# Patient Record
Sex: Male | Born: 1935 | Race: Black or African American | Hispanic: No | Smoking: Never smoker
Health system: Southern US, Community
[De-identification: ages and names within clinical notes are randomized; demographics above are authoritative.]

## PROBLEM LIST (undated history)

## (undated) DIAGNOSIS — R32 Unspecified urinary incontinence: Secondary | ICD-10-CM

## (undated) DIAGNOSIS — N400 Enlarged prostate without lower urinary tract symptoms: Secondary | ICD-10-CM

## (undated) DIAGNOSIS — D329 Benign neoplasm of meninges, unspecified: Secondary | ICD-10-CM

## (undated) DIAGNOSIS — H4310 Vitreous hemorrhage, unspecified eye: Secondary | ICD-10-CM

## (undated) DIAGNOSIS — I639 Cerebral infarction, unspecified: Secondary | ICD-10-CM

## (undated) DIAGNOSIS — D649 Anemia, unspecified: Secondary | ICD-10-CM

## (undated) DIAGNOSIS — N189 Chronic kidney disease, unspecified: Secondary | ICD-10-CM

## (undated) DIAGNOSIS — H9191 Unspecified hearing loss, right ear: Secondary | ICD-10-CM

## (undated) DIAGNOSIS — E278 Other specified disorders of adrenal gland: Secondary | ICD-10-CM

## (undated) DIAGNOSIS — I10 Essential (primary) hypertension: Secondary | ICD-10-CM

## (undated) DIAGNOSIS — Z86718 Personal history of other venous thrombosis and embolism: Secondary | ICD-10-CM

## (undated) DIAGNOSIS — E785 Hyperlipidemia, unspecified: Secondary | ICD-10-CM

## (undated) DIAGNOSIS — I6340 Cerebral infarction due to embolism of unspecified cerebral artery: Secondary | ICD-10-CM

## (undated) DIAGNOSIS — R296 Repeated falls: Secondary | ICD-10-CM

## (undated) DIAGNOSIS — D696 Thrombocytopenia, unspecified: Secondary | ICD-10-CM

## (undated) DIAGNOSIS — I82409 Acute embolism and thrombosis of unspecified deep veins of unspecified lower extremity: Secondary | ICD-10-CM

## (undated) HISTORY — DX: Benign neoplasm of meninges, unspecified: D32.9

## (undated) HISTORY — DX: Hyperlipidemia, unspecified: E78.5

## (undated) HISTORY — DX: Cerebral infarction, unspecified: I63.9

## (undated) HISTORY — DX: Repeated falls: R29.6

## (undated) HISTORY — DX: Other specified disorders of adrenal gland: E27.8

## (undated) HISTORY — DX: Acute embolism and thrombosis of unspecified deep veins of unspecified lower extremity: I82.409

## (undated) HISTORY — PX: MYRINGOTOMY WITH TUBE PLACEMENT: SHX5663

## (undated) HISTORY — DX: Vitreous hemorrhage, unspecified eye: H43.10

## (undated) HISTORY — DX: Cerebral infarction due to embolism of unspecified cerebral artery: I63.40

---
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
## 2000-10-14 ENCOUNTER — Emergency Department (HOSPITAL_COMMUNITY): Admission: EM | Admit: 2000-10-14 | Discharge: 2000-10-14 | Payer: Self-pay

## 2011-07-19 ENCOUNTER — Emergency Department (HOSPITAL_COMMUNITY): Payer: PRIVATE HEALTH INSURANCE

## 2011-07-19 ENCOUNTER — Encounter (HOSPITAL_COMMUNITY): Payer: Self-pay | Admitting: Internal Medicine

## 2011-07-19 ENCOUNTER — Inpatient Hospital Stay (HOSPITAL_COMMUNITY)
Admission: EM | Admit: 2011-07-19 | Discharge: 2011-07-24 | DRG: 066 | Disposition: A | Discharge status: Rehabilitation Facility | Payer: PRIVATE HEALTH INSURANCE | Attending: Internal Medicine | Admitting: Internal Medicine

## 2011-07-19 DIAGNOSIS — N400 Enlarged prostate without lower urinary tract symptoms: Secondary | ICD-10-CM | POA: Diagnosis present

## 2011-07-19 DIAGNOSIS — I10 Essential (primary) hypertension: Secondary | ICD-10-CM

## 2011-07-19 DIAGNOSIS — R4789 Other speech disturbances: Secondary | ICD-10-CM

## 2011-07-19 DIAGNOSIS — I6330 Cerebral infarction due to thrombosis of unspecified cerebral artery: Secondary | ICD-10-CM

## 2011-07-19 DIAGNOSIS — H9190 Unspecified hearing loss, unspecified ear: Secondary | ICD-10-CM | POA: Diagnosis present

## 2011-07-19 DIAGNOSIS — R279 Unspecified lack of coordination: Secondary | ICD-10-CM | POA: Diagnosis present

## 2011-07-19 DIAGNOSIS — D320 Benign neoplasm of cerebral meninges: Secondary | ICD-10-CM | POA: Diagnosis present

## 2011-07-19 DIAGNOSIS — I6350 Cerebral infarction due to unspecified occlusion or stenosis of unspecified cerebral artery: Principal | ICD-10-CM | POA: Diagnosis present

## 2011-07-19 DIAGNOSIS — D329 Benign neoplasm of meninges, unspecified: Secondary | ICD-10-CM | POA: Diagnosis present

## 2011-07-19 DIAGNOSIS — Z8673 Personal history of transient ischemic attack (TIA), and cerebral infarction without residual deficits: Secondary | ICD-10-CM | POA: Diagnosis present

## 2011-07-19 DIAGNOSIS — R269 Unspecified abnormalities of gait and mobility: Secondary | ICD-10-CM | POA: Diagnosis present

## 2011-07-19 DIAGNOSIS — I639 Cerebral infarction, unspecified: Secondary | ICD-10-CM

## 2011-07-19 HISTORY — DX: Unspecified hearing loss, right ear: H91.91

## 2011-07-19 HISTORY — DX: Essential (primary) hypertension: I10

## 2011-07-19 LAB — DIFFERENTIAL
Basophils Absolute: 0 10*3/uL (ref 0.0–0.1)
Basophils Relative: 0 % (ref 0–1)
Eosinophils Absolute: 0 10*3/uL (ref 0.0–0.7)
Eosinophils Relative: 0 % (ref 0–5)
Lymphocytes Relative: 9 % — ABNORMAL LOW (ref 12–46)
Lymphs Abs: 0.8 10*3/uL (ref 0.7–4.0)
Monocytes Absolute: 0.2 10*3/uL (ref 0.1–1.0)
Monocytes Relative: 3 % (ref 3–12)
Neutro Abs: 7.5 10*3/uL (ref 1.7–7.7)
Neutrophils Relative %: 89 % — ABNORMAL HIGH (ref 43–77)

## 2011-07-19 LAB — CBC
HCT: 39.2 % (ref 39.0–52.0)
Hemoglobin: 13.6 g/dL (ref 13.0–17.0)
MCH: 31.7 pg (ref 26.0–34.0)
MCHC: 34.7 g/dL (ref 30.0–36.0)
MCV: 91.4 fL (ref 78.0–100.0)
Platelets: 144 10*3/uL — ABNORMAL LOW (ref 150–400)
RBC: 4.29 MIL/uL (ref 4.22–5.81)
RDW: 12.7 % (ref 11.5–15.5)
WBC: 8.5 10*3/uL (ref 4.0–10.5)

## 2011-07-19 LAB — POCT I-STAT TROPONIN I: Troponin i, poc: 0.01 ng/mL (ref 0.00–0.08)

## 2011-07-19 LAB — COMPREHENSIVE METABOLIC PANEL
ALT: 17 U/L (ref 0–53)
AST: 29 U/L (ref 0–37)
Albumin: 3.8 g/dL (ref 3.5–5.2)
Alkaline Phosphatase: 57 U/L (ref 39–117)
BUN: 11 mg/dL (ref 6–23)
CO2: 25 mEq/L (ref 19–32)
Calcium: 10.5 mg/dL (ref 8.4–10.5)
Chloride: 102 mEq/L (ref 96–112)
Creatinine, Ser: 0.62 mg/dL (ref 0.50–1.35)
GFR calc Af Amer: >90 mL/min (ref >90)
GFR calc non Af Amer: 89 mL/min — ABNORMAL LOW (ref >90)
Glucose, Bld: 152 mg/dL — ABNORMAL HIGH (ref 70–99)
Potassium: 4.6 mEq/L (ref 3.5–5.1)
Sodium: 139 mEq/L (ref 135–145)
Total Bilirubin: 0.3 mg/dL (ref 0.3–1.2)
Total Protein: 7.4 g/dL (ref 6.0–8.3)

## 2011-07-19 LAB — URINALYSIS, ROUTINE W REFLEX MICROSCOPIC
Bilirubin Urine: NEGATIVE
Glucose, UA: NEGATIVE mg/dL
Ketones, ur: 15 mg/dL — AB
Leukocytes, UA: NEGATIVE
Nitrite: NEGATIVE
Protein, ur: NEGATIVE mg/dL
Specific Gravity, Urine: 1.012 (ref 1.005–1.030)
Urobilinogen, UA: 0.2 mg/dL (ref 0.0–1.0)
pH: 8 (ref 5.0–8.0)

## 2011-07-19 LAB — URINE MICROSCOPIC-ADD ON

## 2011-07-19 LAB — TROPONIN I: Troponin I: <0.3 ng/mL (ref <0.30)

## 2011-07-19 MED ORDER — ASPIRIN 300 MG RE SUPP
300.0000 mg | Freq: Every day | RECTAL | Status: DC
  Filled 2011-07-19: qty 1

## 2011-07-19 MED ORDER — GADOBENATE DIMEGLUMINE 529 MG/ML IV SOLN
15.0000 mL | Freq: Once | INTRAVENOUS | Status: AC
  Administered 2011-07-19: 15 mL via INTRAVENOUS

## 2011-07-19 MED ORDER — BENAZEPRIL HCL 20 MG PO TABS
20.0000 mg | ORAL_TABLET | Freq: Every day | ORAL | Status: DC
  Administered 2011-07-20 – 2011-07-24 (×5): 20 mg via ORAL
  Filled 2011-07-19 (×5): qty 1

## 2011-07-19 MED ORDER — ACETAMINOPHEN 650 MG RE SUPP: 650.0000 mg | RECTAL | Status: DC | PRN

## 2011-07-19 MED ORDER — ONDANSETRON HCL 4 MG/2ML IJ SOLN: 4.0000 mg | Freq: Four times a day (QID) | INTRAMUSCULAR | Status: DC | PRN

## 2011-07-19 MED ORDER — SENNOSIDES-DOCUSATE SODIUM 8.6-50 MG PO TABS
1.0000 | ORAL_TABLET | Freq: Every evening | ORAL | Status: DC | PRN
  Filled 2011-07-19: qty 1

## 2011-07-19 MED ORDER — AMLODIPINE BESYLATE 10 MG PO TABS
10.0000 mg | ORAL_TABLET | Freq: Every day | ORAL | Status: DC
  Administered 2011-07-20 – 2011-07-24 (×5): 10 mg via ORAL
  Filled 2011-07-19 (×5): qty 1

## 2011-07-19 MED ORDER — SODIUM CHLORIDE 0.9 % IV SOLN
INTRAVENOUS | Status: DC
  Administered 2011-07-20: via INTRAVENOUS

## 2011-07-19 MED ORDER — ACETAMINOPHEN 325 MG PO TABS
650.0000 mg | ORAL_TABLET | ORAL | Status: DC | PRN
  Administered 2011-07-23: 650 mg via ORAL
  Filled 2011-07-19: qty 2

## 2011-07-19 MED ORDER — ASPIRIN 325 MG PO TABS
325.0000 mg | ORAL_TABLET | Freq: Every day | ORAL | Status: DC
  Administered 2011-07-20 – 2011-07-24 (×5): 325 mg via ORAL
  Filled 2011-07-19 (×5): qty 1

## 2011-07-19 NOTE — ED Notes (Signed)
Patient transported to MRI 

## 2011-07-19 NOTE — H&P (Addendum)
PCP:  Georgann Housekeeper   Chief Complaint:   confusion  HPI: ZACK CRAGER is a 78 y.o. male   has a past medical history of Hypertension and Difficulty hearing, right.   Presented with  With evidence of confusion and difficulty walking noted in the afternoon. Last time was noted to be ok was last night before he went to sleep. The patient usually lives by himself but this time was staying over with his daughter. In the morning he usually gets up early but not today. His daughter went to work in the afternoon went home and noted that he was some still in bed and very lethargic. This was very unusual for a patient who is normally extremely active. His grandson had to help him to the bathroom which is also very unusual. His family noted him to walk leaning toward right side. He was slurring his speech when answering the questions. At which point EMS was called. He arrived to emerge department and his first CT scan was questionable for hemorrhage versus meningioma. MRI was done that showed small meningioma but also acute CVA at that point hospitalist was called for consult and admission.  Review of Systems:    Pertinent positives include: ataxia, slurred speech, confusion  Constitutional:  No weight loss, night sweats, Fevers, chills, fatigue, weight loss  HEENT:  No headaches, Difficulty swallowing,Tooth/dental problems,Sore throat,  No sneezing, itching, ear ache, nasal congestion, post nasal drip,  Cardio-vascular:  No chest pain, Orthopnea, PND, anasarca, dizziness, palpitations.no Bilateral lower extremity swelling  GI:  No heartburn, indigestion, abdominal pain, nausea, vomiting, diarrhea, change in bowel habits, loss of appetite, melena, blood in stool, hematemesis Resp:  no shortness of breath at rest. No dyspnea on exertion, No excess mucus, no productive cough, No non-productive cough, No coughing up of blood.No change in color of mucus.No wheezing. Skin:  no rash or lesions. No  jaundice GU:  no dysuria, change in color of urine, no urgency or frequency. No straining to urinate.  No flank pain.  Musculoskeletal:  No joint pain or no joint swelling. No decreased range of motion. No back pain.  Psych:  No change in mood or affect. No depression or anxiety. No memory loss.  Neuro: no localizing neurological complaints, no tingling, no weakness, no double vision, no gait abnormality, no  no   Otherwise ROS are negative except for above, 10 systems were reviewed  Past Medical History: Past Medical History  Diagnosis Date  . Hypertension   . Difficulty hearing, right    History reviewed. No pertinent past surgical history.   Medications: Prior to Admission medications   Medication Sig Start Date End Date Taking? Authorizing Provider  amLODipine-benazepril (LOTREL) 10-20 MG per capsule Take 1 capsule by mouth daily.   Yes Historical Provider, MD    Allergies:  No Known Allergies  Social History:  Ambulatory  independently  Lives at home alone with family close by   does not have a smoking history on file. He does not have any smokeless tobacco history on file. He reports that he does not drink alcohol or use illicit drugs.   Family History: family history includes Diabetes in his mother and Heart disease in his mother.    Physical Exam: Patient Vitals for the past 24 hrs:  BP Temp Temp src Pulse Resp SpO2  07/19/11 2010 - 97.6 F (36.4 C) - - - -  07/19/11 1751 148/84 mmHg - - 70  14  99 %  07/19/11  1750 - - - 69  - -  07/19/11 1645 118/62 mmHg - - 60  18  99 %  07/19/11 1630 117/67 mmHg - - 57  18  99 %  07/19/11 1615 128/70 mmHg - - 68  19  99 %  07/19/11 1600 138/71 mmHg - - 76  19  98 %  07/19/11 1559 136/77 mmHg - - 76  16  98 %  07/19/11 1434 142/80 mmHg 96.7 F (35.9 C) Oral 76  16  97 %    1. General:  in No Acute distress 2. Psychological: Alert and Oriented 3. Head/ENT:   Moist  Mucous Membranes                          Head Non  traumatic, neck supple                          Poor Dentition 4. SKIN: normal  Skin turgor,  Skin clean Dry and intact no rash 5. Heart: Regular rate and rhythm slight ejection Murmur, Rub or gallop 6. Lungs: Clear to auscultation bilaterally, no wheezes or crackles   7. Abdomen: Soft, non-tender, Non distended 8. Lower extremities: no clubbing, cyanosis, or edema 9. Neurologically Grossly intact, moving all 4 extremities equally, grips are intact bilaterally,  cranial nerves II through XII appear to be intact with exception of slight left sided droop  10. MSK: Normal range of motion  body mass index is unknown because there is no height or weight on file.   Labs on Admission:   [facility] 07/19/11 1519  NA 139  K 4.6  CL 102  CO2 25  GLUCOSE 152*  BUN 11  CREATININE 0.62  CALCIUM 10.5  MG --  PHOS --    Basename 07/19/11 1519  AST 29  ALT 17  ALKPHOS 57  BILITOT 0.3  PROT 7.4  ALBUMIN 3.8   No results found for this basename: LIPASE:2,AMYLASE:2 in the last 72 hours  Basename 07/19/11 1519  WBC 8.5  NEUTROABS 7.5  HGB 13.6  HCT 39.2  MCV 91.4  PLT 144*    Basename 07/19/11 1536  CKTOTAL --  CKMB --  CKMBINDEX --  TROPONINI <0.30   No results found for this basename: TSH,T4TOTAL,FREET3,T3FREE,THYROIDAB in the last 72 hours No results found for this basename: VITAMINB12:2,FOLATE:2,FERRITIN:2,TIBC:2,IRON:2,RETICCTPCT:2 in the last 72 hours No results found for this basename: HGBA1C    CrCl is unknown because there is no height on file for the current visit. ABG No results found for this basename: phart, pco2, po2, hco3, tco2, acidbasedef, o2sat     No results found for this basename: DDIMER     Other results:  I have pearsonaly reviewed this: ECG REPORT  Rate 75  Rhythm:  Normal sinus rhythm ST&T Change:  No ischemic changes  UA no infection   Cultures: No results found for this basename: sdes, specrequest, cult, reptstatus         Radiological Exams on Admission: Ct Head Wo Contrast  07/19/2011  *RADIOLOGY REPORT*  Clinical Data: Weakness.  Slurred speech.  CT HEAD WITHOUT CONTRAST  Technique:  Contiguous axial images were obtained from the base of the skull through the vertex without contrast.  Comparison: None.  Findings: There is a hyperattenuating mass in the inferior left frontal lobe (image 8 series 2) measuring 26 mm AP by 23 mm transverse.  This extends to the floor of  the left anterior cranial fossa.  Hounsfield attenuation units of 43 are present within the mass.  There is surrounding edema.  There are no other mass lesions identified.  No midline shift.  No hydrocephalus.  Chronic ischemic white matter disease and atrophy.  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  There is no intraventricular blood.  No convincing evidence of hyperostosis of the floor of the anterior cranial fossa adjacent to the mass lesion.  Intracranial atherosclerosis is noted. Old right occipital lobe infarct is also noted.  IMPRESSION: 1.  Inferior left frontal lobe mass lesion measuring 26 mm x 23 mm. Differential considerations include subacute hemorrhage or meningioma arising from the floor of the anterior cranial fossa. Inferior left frontal lobe subacute infarct with subsequent hemorrhagic transformation may account for the appearance, particularly in the setting of right cerebellar and right occipital infarcts.  Hemorrhagic mass is not excluded.  Consider follow-up MRI with without infusion for further evaluation although this can be followed with CT to see if the mass shows expected evolution of hemorrhage. 2.  Underlying atrophy and chronic ischemic white matter disease. Old right cerebellar infarct. 3. Critical Value/emergent results were called by telephone at the time of interpretation on 07/19/2011  at 1725 hours  to  Dr. Bethann Berkshire, who verbally acknowledged these results.  Original Report Authenticated By: Andreas Newport, M.D.    [name] Contrast  07/19/2011  *RADIOLOGY REPORT*  Clinical Data: Weakness.  Slurred speech.  Abnormal CT head  MRI HEAD WITHOUT AND WITH CONTRAST  Technique:  Multiplanar, multiecho pulse sequences of the brain and surrounding structures were obtained according to standard protocol without and with intravenous contrast  Contrast: 15mL MULTIHANCE GADOBENATE DIMEGLUMINE 529 MG/ML IV SOLN  Comparison: C t head prior 08/13  Findings: Acute infarct in the right superior and lateral cerebellum.  No other acute infarct.  Enhancing mass lesion left frontal lobe.  This shows homogeneous enhancement and has  attachment to the orbital roof.  This is compatible with a meningioma.  This measures 2.7 x 2.0 x 2.2 cm. There is surrounding white matter edema.  No midline shift.  Generalized atrophy.  Moderate chronic ischemic changes in the white matter.  Chronic ischemia in the right thalamus and pons bilaterally.  Chronic infarcts in the cerebellum bilaterally.  IMPRESSION: Acute infarct right superior and lateral cerebellum.  Meningioma of the left orbital roof.  Atrophy and moderate to advanced chronic ischemic changes.  Original Report Authenticated By: Camelia Phenes, M.D.   Dg Chest Port 1 View  07/19/2011  *RADIOLOGY REPORT*  Clinical Data: Weakness, question stroke  PORTABLE CHEST - 1 VIEW  Comparison: Portable exam 1555 hours without priors for comparison  Findings: Minimal enlargement cardiac silhouette. Tortuous aorta. Pulmonary vascularity normal. Lungs clear. No pleural effusion or pneumothorax. No definite acute osseous findings.  IMPRESSION: Enlargement of cardiac silhouette. No acute abnormalities.  Original Report Authenticated By: Lollie Marrow, M.D.    Assessment/Plan  78 year old gentleman history of hypertension here with acute CVA  Present on Admission:  .HTN (hypertension) - continue home medications currently stable  .CVA (cerebral infarction) -  - will admit based on CVA protocol, await  results of MRA Carotid Doppler and Echo, obtain cardiac enzymes,  ECG, Homocysteine, Lipid panel, TSH. Order PT/OT evaluation. Will make sure patient is on antiplatelet agent. Consider Neurology consult.        Prophylaxis: SCD   CODE STATUS: FULL CODE  I have spent a total of  55 min on this admission  Jeyda Siebel 07/19/2011, 10:25 PM

## 2011-07-19 NOTE — ED Notes (Signed)
PT. PASSED SWALLOWING SCREEN BY ER NURSE , DENIES PAIN OR DISCOMFORT , RESPIRATIONS UNLABORED , IV SITE UNREMARKABLE.

## 2011-07-19 NOTE — ED Notes (Signed)
PT. CURRENTLY AT MRI.

## 2011-07-19 NOTE — ED Notes (Signed)
REPORTS GIVEN TO FLOOR NURSE . TRANSPORTED IN STABLE CONDITION WITH FAMILY MEMBERS.

## 2011-07-19 NOTE — ED Provider Notes (Addendum)
History     CSN: [id number]  Arrival date & time 07/19/11  1454   First MD Initiated Contact with Patient 07/19/11 1506      Chief Complaint  Patient presents with  . Weakness    (Consider location/radiation/quality/duration/timing/severity/associated sxs/prior treatment) Patient is a 78 y.o. male presenting with weakness. The history is provided by a relative (daughter states he woke up weak today and slurred speech). No language interpreter was used.  Weakness The primary symptoms include focal weakness. Primary symptoms do not include headaches or seizures. The symptoms began 12 to 24 hours ago. The episode lasted 12 hours. The symptoms are improving. The neurological symptoms are focal. Context: sleeping.  Additional symptoms include weakness. Additional symptoms do not include neck stiffness or hallucinations. Medical issues do not include seizures. Workup history includes MRI.    No past medical history on file.  No past surgical history on file.  No family history on file.  History  Substance Use Topics  . Smoking status: Not on file  . Smokeless tobacco: Not on file  . Alcohol Use: Not on file      Review of Systems  Constitutional: Negative for fatigue.  HENT: Negative for congestion, neck stiffness, sinus pressure and ear discharge.   Eyes: Negative for discharge.  Respiratory: Negative for cough.   Cardiovascular: Negative for chest pain.  Gastrointestinal: Negative for abdominal pain and diarrhea.  Genitourinary: Negative for frequency and hematuria.  Musculoskeletal: Negative for back pain.  Skin: Negative for rash.  Neurological: Positive for focal weakness and weakness. Negative for seizures and headaches.  Hematological: Negative.   Psychiatric/Behavioral: Negative for hallucinations.    Allergies  Review of patient's allergies indicates no known allergies.  Home Medications   Current Outpatient Rx  Name Route Sig Dispense Refill  . AMLODIPINE  BESY-BENAZEPRIL HCL 10-20 MG PO CAPS Oral Take 1 capsule by mouth daily.      BP 148/84  Pulse 70  Temp(Src) 97.6 F (36.4 C) (Oral)  Resp 14  SpO2 99%  Physical Exam  Constitutional: He is oriented to person, place, and time. He appears well-developed.  HENT:  Head: Normocephalic and atraumatic.  Eyes: Conjunctivae and EOM are normal. No scleral icterus.  Neck: Neck supple. No thyromegaly present.  Cardiovascular: Normal rate and regular rhythm.  Exam reveals no gallop and no friction rub.   No murmur heard. Pulmonary/Chest: No stridor. He has no wheezes. He has no rales. He exhibits no tenderness.  Abdominal: He exhibits no distension. There is no tenderness. There is no rebound.  Musculoskeletal: He exhibits no edema.       Pt with weakness in right leg,  Unable to walk  Lymphadenopathy:    He has no cervical adenopathy.  Neurological: He is oriented to person, place, and time. Coordination abnormal.       Poor coordination and unable to walk  Skin: No rash noted. No erythema.  Psychiatric: He has a normal mood and affect. His behavior is normal.    ED Course  Procedures (including critical care time)  Labs Reviewed  CBC - Abnormal; Notable for the following:    Platelets 144 (*)    All other components within normal limits  COMPREHENSIVE METABOLIC PANEL - Abnormal; Notable for the following:    Glucose, Bld 152 (*)    GFR calc non Af Amer 89 (*)    All other components within normal limits  URINALYSIS, ROUTINE W REFLEX MICROSCOPIC - Abnormal; Notable for the following:  APPearance HAZY (*)    Hgb urine dipstick TRACE (*)    Ketones, ur 15 (*)    All other components within normal limits  DIFFERENTIAL - Abnormal; Notable for the following:    Neutrophils Relative 89 (*)    Lymphocytes Relative 9 (*)    All other components within normal limits  POCT I-STAT TROPONIN I  TROPONIN I  URINE MICROSCOPIC-ADD ON   Ct Head Wo Contrast  07/19/2011  *RADIOLOGY REPORT*   Clinical Data: Weakness.  Slurred speech.  CT HEAD WITHOUT CONTRAST  Technique:  Contiguous axial images were obtained from the base of the skull through the vertex without contrast.  Comparison: None.  Findings: There is a hyperattenuating mass in the inferior left frontal lobe (image 8 series 2) measuring 26 mm AP by 23 mm transverse.  This extends to the floor of the left anterior cranial fossa.  Hounsfield attenuation units of 43 are present within the mass.  There is surrounding edema.  There are no other mass lesions identified.  No midline shift.  No hydrocephalus.  Chronic ischemic white matter disease and atrophy.  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  There is no intraventricular blood.  No convincing evidence of hyperostosis of the floor of the anterior cranial fossa adjacent to the mass lesion.  Intracranial atherosclerosis is noted. Old right occipital lobe infarct is also noted.  IMPRESSION: 1.  Inferior left frontal lobe mass lesion measuring 26 mm x 23 mm. Differential considerations include subacute hemorrhage or meningioma arising from the floor of the anterior cranial fossa. Inferior left frontal lobe subacute infarct with subsequent hemorrhagic transformation may account for the appearance, particularly in the setting of right cerebellar and right occipital infarcts.  Hemorrhagic mass is not excluded.  Consider follow-up MRI with without infusion for further evaluation although this can be followed with CT to see if the mass shows expected evolution of hemorrhage. 2.  Underlying atrophy and chronic ischemic white matter disease. Old right cerebellar infarct. 3. Critical Value/emergent results were called by telephone at the time of interpretation on 07/19/2011  at 1725 hours  to  Dr. Bethann Berkshire, who verbally acknowledged these results.  Original Report Authenticated By: Andreas Newport, M.D.   Mr Laqueta Jean YQ Contrast  07/19/2011  *RADIOLOGY REPORT*  Clinical Data: Weakness.  Slurred  speech.  Abnormal CT head  MRI HEAD WITHOUT AND WITH CONTRAST  Technique:  Multiplanar, multiecho pulse sequences of the brain and surrounding structures were obtained according to standard protocol without and with intravenous contrast  Contrast: 15mL MULTIHANCE GADOBENATE DIMEGLUMINE 529 MG/ML IV SOLN  Comparison: C t head prior 08/13  Findings: Acute infarct in the right superior and lateral cerebellum.  No other acute infarct.  Enhancing mass lesion left frontal lobe.  This shows homogeneous enhancement and has  attachment to the orbital roof.  This is compatible with a meningioma.  This measures 2.7 x 2.0 x 2.2 cm. There is surrounding white matter edema.  No midline shift.  Generalized atrophy.  Moderate chronic ischemic changes in the white matter.  Chronic ischemia in the right thalamus and pons bilaterally.  Chronic infarcts in the cerebellum bilaterally.  IMPRESSION: Acute infarct right superior and lateral cerebellum.  Meningioma of the left orbital roof.  Atrophy and moderate to advanced chronic ischemic changes.  Original Report Authenticated By: Camelia Phenes, M.D.   Dg Chest Port 1 View  07/19/2011  *RADIOLOGY REPORT*  Clinical Data: Weakness, question stroke  PORTABLE CHEST -  1 VIEW  Comparison: Portable exam 1555 hours without priors for comparison  Findings: Minimal enlargement cardiac silhouette. Tortuous aorta. Pulmonary vascularity normal. Lungs clear. No pleural effusion or pneumothorax. No definite acute osseous findings.  IMPRESSION: Enlargement of cardiac silhouette. No acute abnormalities.  Original Report Authenticated By: Lollie Marrow, M.D.     1. Stroke       MDM          Benny Lennert, MD 07/19/11 2111  Benny Lennert, MD 07/19/11 2211

## 2011-07-19 NOTE — ED Notes (Signed)
Patient returned from CT. Remains on monitor and is sleeping with NAD at this time. Family at bedside.

## 2011-07-19 NOTE — ED Notes (Signed)
Patient remains on monitor and is sleeping with NAD at this time. Patient family at bedside.

## 2011-07-19 NOTE — ED Notes (Signed)
Patient daughter states that her dad normally lives at home and cares for himself and still drives and he has been staying at her house at night because of heating issues at his house. This morning he did not get up before she left for work and she called to check on him at approx 1330 and he as still in the bed. Patients grandson stated that the patient walked to the restroom once this morning and had to have assistance to walk. Daughter states this is totally out os character for her father. EKG captures on arrival to [facility].

## 2011-07-19 NOTE — ED Notes (Signed)
Pt sent here via EMS by family because patient reportedly very weak on his feet, needing assistance to the bathroom. Which is not like patients normal. ?slurred speech since last night, ekg SR, bbg 120

## 2011-07-20 ENCOUNTER — Inpatient Hospital Stay (HOSPITAL_COMMUNITY): Payer: PRIVATE HEALTH INSURANCE

## 2011-07-20 ENCOUNTER — Encounter (HOSPITAL_COMMUNITY): Payer: Self-pay | Admitting: *Deleted

## 2011-07-20 DIAGNOSIS — H9190 Unspecified hearing loss, unspecified ear: Secondary | ICD-10-CM | POA: Diagnosis present

## 2011-07-20 DIAGNOSIS — D329 Benign neoplasm of meninges, unspecified: Secondary | ICD-10-CM | POA: Diagnosis present

## 2011-07-20 LAB — CARDIAC PANEL(CRET KIN+CKTOT+MB+TROPI)
CK, MB: 4.3 ng/mL — ABNORMAL HIGH (ref 0.3–4.0)
Relative Index: 3.1 — ABNORMAL HIGH (ref 0.0–2.5)
Total CK: 139 U/L (ref 7–232)
Troponin I: <0.3 ng/mL (ref <0.30)

## 2011-07-20 LAB — HEMOGLOBIN A1C
Hgb A1c MFr Bld: 5.8 % — ABNORMAL HIGH (ref <5.7)
Mean Plasma Glucose: 120 mg/dL — ABNORMAL HIGH (ref <117)

## 2011-07-20 LAB — LIPID PANEL
Cholesterol: 173 mg/dL (ref 0–200)
HDL: 68 mg/dL (ref >39)
LDL Cholesterol: 91 mg/dL (ref 0–99)
Total CHOL/HDL Ratio: 2.5 RATIO
Triglycerides: 69 mg/dL (ref <150)
VLDL: 14 mg/dL (ref 0–40)

## 2011-07-20 MED ORDER — STROKE: EARLY STAGES OF RECOVERY BOOK
Freq: Once | Status: DC
  Filled 2011-07-20: qty 1

## 2011-07-20 NOTE — Discharge Instructions (Signed)
STROKE/TIA DISCHARGE INSTRUCTIONS SMOKING Cigarette smoking nearly doubles your risk of having a stroke & is the single most alterable risk factor  If you smoke or have smoked in the last 12 months, you are advised to quit smoking for your health.  Most of the excess cardiovascular risk related to smoking disappears within a year of stopping.  Ask you doctor about anti-smoking medications  [place] Quit Line: 1-800-QUIT NOW  Free Smoking Cessation Classes [phone]  CHOLESTEROL Know your levels; limit fat & cholesterol in your diet  Lipid Panel     Component Value Date/Time   CHOL 173 07/20/2011 0510   TRIG 69 07/20/2011 0510   HDL 68 07/20/2011 0510   CHOLHDL 2.5 07/20/2011 0510   VLDL 14 07/20/2011 0510   LDLCALC 91 07/20/2011 0510      Many patients benefit from treatment even if their cholesterol is at goal.  Goal: Total Cholesterol (CHOL) less than 160  Goal:  Triglycerides (TRIG) less than 150  Goal:  HDL greater than 40  Goal:  LDL (LDLCALC) less than 100   BLOOD PRESSURE American Stroke Association blood pressure target is less that 120/80 mm/Hg  Your discharge blood pressure is:  BP: 132/72 mmHg  Monitor your blood pressure  Limit your salt and alcohol intake  Many individuals will require more than one medication for high blood pressure  DIABETES (A1c is a blood sugar average for last 3 months) Goal HGBA1c is under 7% (HBGA1c is blood sugar average for last 3 months)  Diabetes: {STROKE DC DIABETES:22357}    No results found for this basename: HGBA1C     Your HGBA1c can be lowered with medications, healthy diet, and exercise.  Check your blood sugar as directed by your physician  Call your physician if you experience unexplained or low blood sugars.  PHYSICAL ACTIVITY/REHABILITATION Goal is 30 minutes at least 4 days per week    {STROKE DC ACTIVITY/REHAB:22359}  Activity decreases your risk of heart attack and stroke and makes your heart stronger.  It helps control  your weight and blood pressure; helps you relax and can improve your mood.  Participate in a regular exercise program.  Talk with your doctor about the best form of exercise for you (dancing, walking, swimming, cycling).  DIET/WEIGHT Goal is to maintain a healthy weight  Your discharge diet is: Cardiac *** liquids Your height is:  Height: 5\' 7"  (170.2 cm) Your current weight is: Weight: 77.883 kg (171 lb 11.2 oz) Your Body Mass Index (BMI) is:  BMI (Calculated): 26.9   Following the type of diet specifically designed for you will help prevent another stroke.  Your goal weight range is:  ***  Your goal Body Mass Index (BMI) is 19-24.  Healthy food habits can help reduce 3 risk factors for stroke:  High cholesterol, hypertension, and excess weight.  RESOURCES Stroke/Support Group:  Call [phone]  they meet the 3rd Sunday of the month on the Rehab Unit at [facility], [place] ( no meetings June, July & Aug).  STROKE EDUCATION PROVIDED/REVIEWED AND GIVEN TO PATIENT Stroke warning signs and symptoms How to activate emergency medical system (call 911). Medications prescribed at discharge. Need for follow-up after discharge. Personal risk factors for stroke. Pneumonia vaccine given:   {STROKE DC YES/NO/DATE:22363} Flu vaccine given:   {STROKE DC YES/NO/DATE:22363} My questions have been answered, the writing is legible, and I understand these instructions.  I will adhere to these goals & educational materials that have been provided to me  after my discharge from the hospital.

## 2011-07-20 NOTE — Evaluation (Signed)
Physical Therapy Evaluation Patient Details Name: [name] MRN: [id number] DOB: 1924-05-17 Today's Date: 07/20/2011 Time: 4098-1191 PT Time Calculation (min): 20 min  PT Assessment / Plan / Recommendation Clinical Impression  Patient presents with cerebellar infarct and NIHSS 3. His balance and co-ordination are extremely impaired and as such he is extemely unsafe with transfers from sit to stand and gait. We will continue to follow to maximize safety with gait/functional independence and to reduce the burden of care on his family.     PT Assessment  Patient needs continued PT services    Follow Up Recommendations  Inpatient Rehab    Barriers to Discharge  Daughter works      lEquipment Recommendations  Defer to next venue    Recommendations for Other Services Rehab consult   Frequency Min 4X/week    Precautions / Restrictions Precautions Precautions: Fall   Pertinent Vitals/Pain VSS/ no pain      Mobility  Bed Mobility Bed Mobility: Rolling Left;Left Sidelying to Sit;Sitting - Scoot to [place] of Bed;Sit to Supine Rolling Left: 6: Modified independent (Device/Increase time) Left Sidelying to Sit: 6: Modified independent (Device/Increase time) Sitting - Scoot to Edge of Bed: 6: Modified independent (Device/Increase time) Sit to Supine: 6: Modified independent (Device/Increase time) Transfers Transfers: Sit to Stand;Stand to Sit Sit to Stand: 3: Mod assist;With upper extremity assist;From elevated surface;From bed Stand to Sit: 4: Min assist;With upper extremity assist;To bed Details for Transfer Assistance: Patient with difficulty initiating stand secondary to retropulsion with initiation. Once standing patient with heavy right and posterior bias.  Ambulation/Gait Ambulation/Gait Assistance: 2: Max assist Ambulation Distance (Feet): 20 Feet Assistive device: Rolling walker Ambulation/Gait Assistance Details: Leaning on PTon right side. Ataxic gait. Assistance for  balance - wide base of support. Gait Pattern: Step-through pattern;Decreased stride length;Ataxic     PT Diagnosis: Abnormality of gait;Difficulty walking  PT Problem List: Decreased activity tolerance;Decreased balance;Decreased mobility;Decreased coordination;Decreased knowledge of use of DME;Impaired sensation PT Treatment Interventions: DME instruction;Gait training;Therapeutic activities;Therapeutic exercise;Balance training;Patient/family education;Neuromuscular re-education   PT Goals Acute Rehab PT Goals PT Goal Formulation: With patient Time For Goal Achievement: 07/27/11 Potential to Achieve Goals: Good Pt will go Sit to Stand: with supervision;with upper extremity assist PT Goal: Sit to Stand - Progress: Goal set today Pt will go Stand to Sit: with supervision;with upper extremity assist PT Goal: Stand to Sit - Progress: Goal set today Pt will Stand: with bilateral upper extremity support;with supervision;1 - 2 min PT Goal: Stand - Progress: Goal set today Pt will Ambulate: 51 - 150 feet;with min assist;with least restrictive assistive device PT Goal: Ambulate - Progress: Goal set today Additional Goals Additional Goal #1: Patient will maintain balance with sitting at the edge of the bed during dynamic activities without loss of balance posteriorly PT Goal: Additional Goal #1 - Progress: Goal set today  Visit Information  Last PT Received On: 07/20/11 Assistance Needed: +2    Subjective Data  Subjective: Patient's daughter states that patient can stay with him at discharge but she does work. Her house is set up for hanicap accessibilty as she took car eof her mother who used a hoverround. Patient Stated Goal: Improve balance   Prior Functioning  Home Living Lives With: Alone Available Help at Discharge: Family;Available PRN/intermittently Type of Home: House Home Access: Stairs to enter [employer] of Steps: 5 with a rail at back, 2 at fron with no  rail Home Layout: One level Home Adaptive Equipment: None Prior Function Level of  Independence: Independent Able to Take Stairs?: Yes Driving: Yes Vocation: Retired Musician: Clinical cytogeneticist  Overall Cognitive Status: Difficult to assess Difficult to assess due to: Hard of hearing/deaf Arousal/Alertness: Awake/alert Orientation Level: Appears intact for tasks assessed Behavior During Session: [facility] for tasks performed    Extremity/Trunk Assessment Right Lower Extremity Assessment RLE ROM/Strength/Tone: Within functional levels RLE Sensation: WFL - Light Touch;Deficits RLE Sensation Deficits: Propriception diminished RLE Coordination: Deficits Left Lower Extremity Assessment LLE ROM/Strength/Tone: Within functional levels LLE Sensation: WFL - Light Touch;Deficits LLE Sensation Deficits: Propriception diminished LLE Coordination: Deficits Trunk Assessment Trunk Assessment: Normal   Balance Static Sitting Balance Static Sitting - Balance Support: Feet unsupported;Bilateral upper extremity supported Static Sitting - Level of Assistance: 5: Stand by assistance Static Sitting - Comment/# of Minutes: Secondary to posterior lean with manual muscle testing  End of Session PT - End of Session Equipment Utilized During Treatment: Gait belt Activity Tolerance: Patient tolerated treatment well Patient left: in bed;with call bell/phone within reach   Edwyna Perfect, PT  Pager [phone]  07/20/2011, 12:28 PM

## 2011-07-20 NOTE — Progress Notes (Signed)
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 Will get MRA/ echo and carotid CIR consult  Objective: Vital signs in last 24 hours: Temp:  [96.7 F (35.9 C)-98.8 F (37.1 C)] 98.8 F (37.1 C) (05/09 0529) Pulse Rate:  [57-87] 68  (05/09 0529) Resp:  [14-19] 17  (05/09 0529) BP: (117-148)/(62-84) 132/72 mmHg (05/09 0529) SpO2:  [97 %-99 %] 97 % (05/09 0529) Weight:  [77.883 kg (171 lb 11.2 oz)] 77.883 kg (171 lb 11.2 oz) (05/08 2313) Weight change:     Intake/Output from previous day:   Intake/Output this shift:    General appearance: alert Resp: clear to auscultation bilaterally Cardio: regular rate and rhythm Extremities: extremities normal, atraumatic, no cyanosis or edema Neurologic: Coordination: truncal ataxia present Gait: Abnormal  Lab Results:  Basename 07/19/11 1519  WBC 8.5  HGB 13.6  HCT 39.2  PLT 144*   BMET  Basename 07/19/11 1519  NA 139  K 4.6  CL 102  CO2 25  GLUCOSE 152*  BUN 11  CREATININE 0.62  CALCIUM 10.5    Studies/Results: Ct Head Wo Contrast  07/19/2011  *RADIOLOGY REPORT*  Clinical Data: Weakness.  Slurred speech.  CT HEAD WITHOUT CONTRAST  Technique:  Contiguous axial images were obtained from the base of the skull through the vertex without contrast.  Comparison: None.  Findings: There is a hyperattenuating mass in the inferior left frontal lobe (image 8 series 2) measuring 26 mm AP by 23 mm transverse.  This extends to the floor of the left anterior cranial fossa.  Hounsfield attenuation units of 43 are present within the mass.  There is surrounding edema.  There are no other mass lesions identified.  No midline shift.  No hydrocephalus.  Chronic ischemic white matter disease and atrophy.  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  There is no intraventricular blood.  No convincing evidence of hyperostosis of the floor of the anterior  cranial fossa adjacent to the mass lesion.  Intracranial atherosclerosis is noted. Old right occipital lobe infarct is also noted.  IMPRESSION: 1.  Inferior left frontal lobe mass lesion measuring 26 mm x 23 mm. Differential considerations include subacute hemorrhage or meningioma arising from the floor of the anterior cranial fossa. Inferior left frontal lobe subacute infarct with subsequent hemorrhagic transformation may account for the appearance, particularly in the setting of right cerebellar and right occipital infarcts.  Hemorrhagic mass is not excluded.  Consider follow-up MRI with without infusion for further evaluation although this can be followed with CT to see if the mass shows expected evolution of hemorrhage. 2.  Underlying atrophy and chronic ischemic white matter disease. Old right cerebellar infarct. 3. Critical Value/emergent results were called by telephone at the time of interpretation on 07/19/2011  at 1725 hours  to  Dr. Bethann Berkshire, who verbally acknowledged these results.  Original Report Authenticated By: Andreas Newport, M.D.   [name] Contrast  07/19/2011  *RADIOLOGY REPORT*  Clinical Data: Weakness.  Slurred speech.  Abnormal CT head  MRI HEAD WITHOUT AND WITH CONTRAST  Technique:  Multiplanar, multiecho pulse sequences of the brain and surrounding structures were obtained according to standard protocol without and with intravenous contrast  Contrast: 15mL MULTIHANCE GADOBENATE DIMEGLUMINE 529 MG/ML IV SOLN  Comparison: C t head prior 08/13  Findings: Acute infarct in the right superior and lateral cerebellum.  No other acute infarct.  Enhancing mass lesion  left frontal lobe.  This shows homogeneous enhancement and has  attachment to the orbital roof.  This is compatible with a meningioma.  This measures 2.7 x 2.0 x 2.2 cm. There is surrounding white matter edema.  No midline shift.  Generalized atrophy.  Moderate chronic ischemic changes in the white matter.  Chronic ischemia  in the right thalamus and pons bilaterally.  Chronic infarcts in the cerebellum bilaterally.  IMPRESSION: Acute infarct right superior and lateral cerebellum.  Meningioma of the left orbital roof.  Atrophy and moderate to advanced chronic ischemic changes.  Original Report Authenticated By: Camelia Phenes, M.D.   Dg Chest Port 1 View  07/19/2011  *RADIOLOGY REPORT*  Clinical Data: Weakness, question stroke  PORTABLE CHEST - 1 VIEW  Comparison: Portable exam 1555 hours without priors for comparison  Findings: Minimal enlargement cardiac silhouette. Tortuous aorta. Pulmonary vascularity normal. Lungs clear. No pleural effusion or pneumothorax. No definite acute osseous findings.  IMPRESSION: Enlargement of cardiac silhouette. No acute abnormalities.  Original Report Authenticated By: Lollie Marrow, M.D.    Medications: I have reviewed the patient's current medications.  Assessment/Plan: Acute CVA- ASA started Right cerebellar MRA/ echo and caortid Speech ok PT OT CIR consult- will benefit- live alone  Meningioma old- will check office record hearing loss HTN BP med Cardiac- NSR  LOS: 1 day   [name] 07/20/2011, 7:35 AM

## 2011-07-20 NOTE — Progress Notes (Signed)
Thank you for consult on [name].  Note that patient was admitted last night with MS changes and acute cerebellar infarct. Work up ongoing- will follow up formal consult as further evaluations completed.

## 2011-07-20 NOTE — Evaluation (Signed)
Speech Language Pathology Evaluation Patient Details Name: [name] MRN: [id number] DOB: 09-01-1924 Today's Date: 07/20/2011 Time: 4098-1191 SLP Time Calculation (min): 15 min  Problem List:  Patient Active Problem List  Diagnoses  . HTN (hypertension)  . CVA (cerebral infarction)  . Hearing loss  . Meningioma   Past Medical History:  Past Medical History  Diagnosis Date  . Hypertension   . Difficulty hearing, right    Past Surgical History: History reviewed. No pertinent past surgical history.  Assessment / Plan / Recommendation Clinical Impression  Pt. presents with minimal dysarthria characterized by occasional distortions of fricative phonemes as pt.'s rate of speech increases.  Cognitive skills for functioning in hospital environment is functional.  ST will see pt. short term for dysarthria treatment.  Recommend inpatient rehab after acute care in which his higher level cognitive abilities can be assessed (lives alone, very independent prior to admission).      SLP Assessment  Patient needs continued Speech Lanaguage Pathology Services    Follow Up Recommendations  Inpatient Rehab    Frequency and Duration min 1 x/week  2 weeks   Pertinent Vitals/Pain    SLP Goals  SLP Goals Potential to Achieve Goals: Good SLP Goal #1: Pt. will recall and implement speech strategies for environment requiring increased volume and articulation with min verbal cues  SLP Evaluation Prior Functioning  Cognitive/Linguistic Baseline: Within functional limits Type of Home: House Lives With: Alone Available Help at Discharge: Family;Available PRN/intermittently Vocation: Retired   IT consultant  Overall Cognitive Status: Appears within functional limits for tasks assessed (Required minimal occasional prompts but overall, WFL) Arousal/Alertness: Awake/alert Orientation Level: Oriented to person;Oriented to place;Oriented to time;Oriented to situation Attention:  (SUSTAINED  ATTENTION) Memory: Appears intact Awareness: Appears intact Problem Solving: Appears intact Safety/Judgment: Appears intact    Comprehension  Auditory Comprehension Overall Auditory Comprehension: Appears within functional limits for tasks assessed Yes/No Questions: Not tested Commands: Within Functional Limits Interfering Components: Hearing EffectiveTechniques: Increased volume (LEFT IS HIS BETTER EAR) Visual Recognition/Discrimination Discrimination: Not tested Reading Comprehension Reading Status: Not tested    Expression Expression Primary Mode of Expression: Verbal Verbal Expression Overall Verbal Expression: Appears within functional limits for tasks assessed Initiation: No impairment Level of Generative/Spontaneous Verbalization: Conversation Repetition: No impairment Naming: Not tested Pragmatics: No impairment Written Expression Dominant Hand: Right Written Expression: Not tested   Oral / Motor Oral Motor/Sensory Function Overall Oral Motor/Sensory Function: Appears within functional limits for tasks assessed Motor Speech Overall Motor Speech: Appears within functional limits for tasks assessed Respiration: Within functional limits Phonation: Normal Resonance: Within functional limits Articulation: Within functional limitis Intelligibility: Intelligibility reduced Conversation: 75-100% accurate Motor Planning: Witnin functional limits Motor Speech Errors: Not applicable     Royce Macadamia M.Ed [employer] [phone]  07/20/2011

## 2011-07-20 NOTE — Progress Notes (Signed)
CSW aware of pt potential disposition needs and consult for CIR. CSW awaiting pt/ot evaluation to help determine pt disposition needs.   .Clinical social worker continuing to follow pt to assist with pt dc plans and further csw needs.   Catha Gosselin, [name]  [phone] .07/20/2011 953am

## 2011-07-20 NOTE — Consult Note (Signed)
Physical Medicine and Rehabilitation Consult Reason for Consult: confusion, slurred speech and problems walking Referring Physician:  Dr. Donette Larry  HPI: OTHA Arroyo is an 78 y.o. Right handed male with history of HTN, HOH, admitted 05/08 with  Lethargy, slurred speech and difficulty walking. MRI brain done  Acute right superior and lateral cerebellar infarct, moderate to advanced chronic ischemic changes and left orbital meningioma.  MRA brain revealead proximal occlusion R-SCA c/w acute infarct. Patient started on ASA for CVA prophylaxis.  PT/OT/ST evaluations done 05/09 and patient noted to have difficulty initiating movements with heavy right and posterior lean and minimal dysarthria.  MD, PT, OT recommending CIR.   Review of Systems  Eyes: Negative for blurred vision and double vision.  Respiratory: Negative for shortness of breath.   Cardiovascular: Negative for chest pain and palpitations.  Genitourinary: Negative for urgency and frequency.  Musculoskeletal: Negative for back pain and joint pain.  Neurological: Negative for dizziness and headaches.   Past Medical History  Diagnosis Date  . Hypertension   . Difficulty hearing, right    History reviewed. No pertinent past surgical history. Family History  Problem Relation Age of Onset  . Diabetes Mother   . Heart disease Mother    Social History:  Lives alone. 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  Has been staying with daughter past few days due to the weather. He reports that he has never smoked. He does not have any smokeless tobacco history on file. He reports that he does not drink alcohol or use illicit drugs. Daughter works full time.  Has a step daughter "Carney Bern" who's retired-can provide supervision past discharge?  Allergies: No Known Allergies  Medications Prior to Admission  Medication Sig Dispense Refill  . amLODipine-benazepril (LOTREL) 10-20 MG per capsule Take 1 capsule by mouth  daily.        Home: Home Living Lives With: Alone Available Help at Discharge: Family;Available PRN/intermittently Type of Home: House Home Access: Stairs to enter [employer] of Steps: 5 with a rail at back, 2 at fron with no rail Home Layout: One level Bathroom Shower/Tub: Tub/shower unit (usually takes tub baths) Bathroom Toilet: Standard Bathroom Accessibility: Yes How Accessible: Accessible via walker Home Adaptive Equipment: None  Functional History: Prior Function Able to Take Stairs?: Yes Driving: Yes Vocation: Retired Functional Status:  Mobility: Bed Mobility Bed Mobility: Supine to Sit;Sitting - Scoot to Edge of Bed Rolling Left: 6: Modified independent (Device/Increase time) Left Sidelying to Sit: 6: Modified independent (Device/Increase time) Supine to Sit: 4: Min guard;With rails;HOB elevated (30 degrees) Sitting - Scoot to Edge of Bed: 4: Min guard Sit to Supine: 6: Modified independent (Device/Increase time) Transfers Transfers: Sit to Stand;Stand to Sit Sit to Stand: 3: Mod assist;With upper extremity assist;From bed Stand to Sit: 3: Mod assist;With upper extremity assist;With armrests;To chair/3-in-1 Ambulation/Gait Ambulation/Gait Assistance: 2: Max assist Ambulation Distance (Feet): 20 Feet Assistive device: Rolling walker Ambulation/Gait Assistance Details: Leaning on PTon right side. Ataxic gait. Assistance for balance - wide base of support. Gait Pattern: Step-through pattern;Decreased stride length;Ataxic    ADL: ADL Eating/Feeding: Simulated;Set up (with increased time due to ataxia RUE) Where Assessed - Eating/Feeding: Bed level Grooming: Simulated;Set up (with increased time due to ataxia RUE) Where Assessed - Grooming: Supported sitting Upper Body Bathing: Simulated;Set up (with increased time due to ataxia RUE) Where Assessed - Upper Body Bathing: Supported;Sitting, chair Lower Body Bathing: Simulated;Maximal assistance (due to  decreased ability  for sit to stand, standing balance,) Where Assessed - Lower Body Bathing: Supported;Sit to stand from chair Upper Body Dressing: Simulated;Moderate assistance (due to ataxia of RUE) Where Assessed - Upper Body Dressing: Supported;Sitting, chair Lower Body Dressing: Maximal assistance;Performed (due to decreased ability for sit to stand, standing balance,) Where Assessed - Lower Body Dressing: Supported;Sit to stand from chair Toilet Transfer: Simulated;Maximal assistance (bed to chair 2 feet apart) Toilet Transfer Method: Stand pivot Toileting - Clothing Manipulation: Performed;Maximal assistance (due to balance deficts and RUE ataxia) Where Assessed - Toileting Clothing Manipulation: Standing Toileting - Hygiene: Simulated;Moderate assistance Tub/Shower Transfer: Not assessed Tub/Shower Transfer Method: Not assessed Equipment Used: Gait belt Ambulation Related to ADLs: Unable to do safely with 1 person A  Cognition: Cognition Overall Cognitive Status: Appears within functional limits for tasks assessed (Required minimal occasional prompts but overall, WFL) Arousal/Alertness: Awake/alert Orientation Level: Oriented X4;Oriented to person;Oriented to place;Oriented to time;Oriented to situation Attention:  (SUSTAINED ATTENTION) Memory: Appears intact Awareness: Appears intact Problem Solving: Appears intact Safety/Judgment: Appears intact Cognition Overall Cognitive Status: Appears within functional limits for tasks assessed/performed Difficult to assess due to: Hard of hearing/deaf Arousal/Alertness: Awake/alert Orientation Level: Appears intact for tasks assessed Behavior During Session: [facility] for tasks performed  Blood pressure 136/57, pulse 68, temperature 98.9 F (37.2 C), temperature source Oral, resp. rate 18, height 5\' 7"  (1.702 m), weight 77.883 kg (171 lb 11.2 oz), SpO2 97.00%. Physical Exam  Nursing note and vitals reviewed. Constitutional: He is oriented  to person, place, and time. He appears well-developed and well-nourished.  HENT:  Head: Normocephalic and atraumatic.  Eyes: Pupils are equal, round, and reactive to light.  Neck: Normal range of motion.  Cardiovascular: Normal rate and regular rhythm.   Pulmonary/Chest: Effort normal and breath sounds normal.  Abdominal: Soft. Bowel sounds are normal.  Neurological: He is alert and oriented to person, place, and time. A cranial nerve deficit is present.       Follows basic commands without difficulty.  Needed cueing initially for 2 step commands.  Mild dysarthria-? Due to hearing impairments.  Ataxia RUE.    Results for orders placed during the hospital encounter of 07/19/11 (from the past 24 hour(s))  CARDIAC PANEL(CRET KIN+CKTOT+MB+TROPI)     Status: Abnormal   Collection Time   07/20/11  1:02 AM      Component Value Range   Total CK 139  7 - 232 (U/L)   CK, MB 4.3 (*) 0.3 - 4.0 (ng/mL)   Troponin I <0.30  <0.30 (ng/mL)   Relative Index 3.1 (*) 0.0 - 2.5   HEMOGLOBIN A1C     Status: Abnormal   Collection Time   07/20/11  5:10 AM      Component Value Range   Hemoglobin A1C 5.8 (*) <5.7 (%)   Mean Plasma Glucose 120 (*) <117 (mg/dL)  LIPID PANEL     Status: Normal   Collection Time   07/20/11  5:10 AM      Component Value Range   Cholesterol 173  0 - 200 (mg/dL)   Triglycerides 69  <119 (mg/dL)   HDL 68  >14 (mg/dL)   Total CHOL/HDL Ratio 2.5     VLDL 14  0 - 40 (mg/dL)   LDL Cholesterol 91  0 - 99 (mg/dL)     Assessment/Plan: Diagnosis: right cerebellar infarcts 1. Does the need for close, 24 hr/day medical supervision in concert with the patient's rehab needs make it unreasonable for this patient to be served in  a less intensive setting? Yes 2. Co-Morbidities requiring supervision/potential complications: htn,  3. Due to bladder management, bowel management, safety, skin/wound care, disease management, medication administration, pain management and patient education, does the  patient require 24 hr/day rehab nursing? Yes 4. Does the patient require coordinated care of a physician, rehab nurse, PT (1-2 hrs/day, 5 days/week), OT (1-2 hrs/day, 5 days/week) and SLP (1-2 hrs/day, 5 days/week) to address physical and functional deficits in the context of the above medical diagnosis(es)? Yes Addressing deficits in the following areas: balance, endurance, locomotion, strength, transferring, bowel/bladder control, bathing, dressing, feeding, grooming, toileting, cognition, speech, language and psychosocial support 5. Can the patient actively participate in an intensive therapy program of at least 3 hrs of therapy per day at least 5 days per week? Yes 6. The potential for patient to make measurable gains while on inpatient rehab is excellent 7. Anticipated functional outcomes upon discharge from inpatient rehab are supervision to minimal assist with PT, supervision to minimal assist with OT, supervision wiith SLP. 8. Estimated rehab length of stay to reach the above functional goals is: 2-3 weeks 9. Does the patient have adequate social supports to accommodate these discharge functional goals? Yes/potentially 10. Anticipated D/C setting: Home 11. Anticipated post D/C treatments: HH therapy 12. Overall Rehab/Functional Prognosis: good  RECOMMENDATIONS: This patient's condition is appropriate for continued rehabilitative care in the following setting: CIR Patient has agreed to participate in recommended program. Yes and Potentially Note that insurance prior authorization may be required for reimbursement for recommended care.  Comment: Rehab RN to follow up   Ivory Broad, MD 07/20/2011

## 2011-07-20 NOTE — Evaluation (Signed)
Occupational Therapy Evaluation Patient Details Name: [name] MRN: [id number] DOB: Sep 10, 1924 Today's Date: 07/20/2011 Time: 1478-2956 OT Time Calculation (min): 27 min  OT Assessment / Plan / Recommendation Clinical Impression  This 78 yo admitted and found to have a cerebellar CVA thus affecting pt's PLOF at I. Pt presents to acute OT with problems below thus affecting his baseline. WIll benefit from acute OT with follow up at inpatient rehab.    OT Assessment  Patient needs continued OT Services    Follow Up Recommendations  Inpatient Rehab    Barriers to Discharge Decreased caregiver support    Equipment Recommendations  Defer to next venue    Recommendations for Other Services    Frequency  Min 3X/week    Precautions / Restrictions Precautions Precautions: Fall Restrictions Weight Bearing Restrictions: No       ADL  Eating/Feeding: Simulated;Set up (with increased time due to ataxia RUE) Where Assessed - Eating/Feeding: Bed level Grooming: Simulated;Set up (with increased time due to ataxia RUE) Where Assessed - Grooming: Supported sitting Upper Body Bathing: Simulated;Set up (with increased time due to ataxia RUE) Where Assessed - Upper Body Bathing: Supported;Sitting, chair Lower Body Bathing: Simulated;Maximal assistance (due to decreased ability for sit to stand, standing balance,) Where Assessed - Lower Body Bathing: Supported;Sit to stand from chair Upper Body Dressing: Simulated;Moderate assistance (due to ataxia of RUE) Where Assessed - Upper Body Dressing: Supported;Sitting, chair Lower Body Dressing: Maximal assistance;Performed (due to decreased ability for sit to stand, standing balance,) Where Assessed - Lower Body Dressing: Supported;Sit to stand from chair Toilet Transfer: Simulated;Maximal assistance (bed to chair 2 feet apart) Toilet Transfer Method: Stand pivot Toileting - Clothing Manipulation: Performed;Maximal assistance (due to balance  deficts and RUE ataxia) Where Assessed - Toileting Clothing Manipulation: Standing Toileting - Hygiene: Simulated;Moderate assistance Tub/Shower Transfer: Not assessed Tub/Shower Transfer Method: Not assessed Equipment Used: Gait belt Ambulation Related to ADLs: Unable to do safely with 1 person A    OT Diagnosis: Generalized weakness;Disturbance of vision;Ataxia  OT Problem List: Impaired balance (sitting and/or standing);Impaired vision/perception;Decreased coordination;Impaired UE functional use;Decreased knowledge of use of DME or AE OT Treatment Interventions: Self-care/ADL training;Therapeutic exercise;DME and/or AE instruction;Therapeutic activities;Patient/family education;Balance training   OT Goals Acute Rehab OT Goals OT Goal Formulation: With patient Time For Goal Achievement: 08/03/11 Potential to Achieve Goals: Good ADL Goals Pt Will Perform Eating: with supervision;Sitting, chair;Supported;with adaptive utensils (setting himself up and self feeding 50%) ADL Goal: Eating - Progress: Goal set today Pt Will Perform Grooming: with set-up;Unsupported;Sitting, edge of bed (2 tasks) ADL Goal: Grooming - Progress: Goal set today Pt Will Perform Upper Body Bathing: with set-up;Supported ADL Goal: Product manager - Progress: Goal set today Pt Will Perform Lower Body Bathing: with min assist;Supported;Sit to stand from chair (Min A sit to stand and maintain standing) ADL Goal: Lower Body Bathing - Progress: Goal set today Pt Will Transfer to Toilet: with min assist;Ambulation;with DME;3-in-1;Regular height toilet;Grab bars ADL Goal: Toilet Transfer - Progress: Goal set today Pt Will Perform Toileting - Clothing Manipulation: with supervision;Standing (min A for standing) ADL Goal: Toileting - Clothing Manipulation - Progress: Goal set today Pt Will Perform Toileting - Hygiene: with min assist;Sit to stand from 3-in-1/toilet ADL Goal: Toileting - Hygiene - Progress: Goal set  today Miscellaneous OT Goals Miscellaneous OT Goal #1: Pt will be able to sit EOB with S while performing grooming tasks/ OT Goal: Miscellaneous Goal #1 - Progress: Goal set today Miscellaneous OT Goal #2: Pt  will be supervision to get up to EOB with HOB down and no rail as precursor to BADL and transfers  Visit Information  Last OT Received On: 07/20/11 Assistance Needed: +2    Subjective Data  Subjective: I haven't had a stroke have I? Patient Stated Goal: Maybe one of my daughters can stay with me   Prior Functioning  Home Living Lives With: Alone Available Help at Discharge: Family;Available PRN/intermittently Type of Home: House Home Access: Stairs to enter [employer] of Steps: 5 with a rail at back, 2 at fron with no rail Home Layout: One level Bathroom Shower/Tub: Tub/shower unit (usually takes tub baths) Bathroom Toilet: Standard Bathroom Accessibility: Yes How Accessible: Accessible via walker Home Adaptive Equipment: None Prior Function Level of Independence: Independent Able to Take Stairs?: Yes Driving: Yes Vocation: Retired Musician: No difficulties Dominant Hand: Right    Cognition  Overall Cognitive Status: Appears within functional limits for tasks assessed/performed Difficult to assess due to: Hard of hearing/deaf Arousal/Alertness: Awake/alert Orientation Level: Appears intact for tasks assessed Behavior During Session: [facility] for tasks performed    Extremity/Trunk Assessment Right Upper Extremity Assessment RUE ROM/Strength/Tone: Deficits RUE ROM/Strength/Tone Deficits: Ataxic RUE Coordination: Deficits RUE Coordination Deficits: Ataxia for both FM and GM movements Left Upper Extremity Assessment LUE ROM/Strength/Tone: Within functional levels Right Lower Extremity Assessment RLE ROM/Strength/Tone: Within functional levels RLE Sensation: WFL - Light Touch;Deficits RLE Sensation Deficits: Propriception diminished RLE  Coordination: Deficits Left Lower Extremity Assessment LLE ROM/Strength/Tone: Within functional levels LLE Sensation: WFL - Light Touch;Deficits LLE Sensation Deficits: Propriception diminished LLE Coordination: Deficits Trunk Assessment Trunk Assessment: Normal   Mobility Bed Mobility Bed Mobility: Supine to Sit;Sitting - Scoot to Edge of Bed Rolling Left: 6: Modified independent (Device/Increase time) Left Sidelying to Sit: 6: Modified independent (Device/Increase time) Supine to Sit: 4: Min guard;With rails;HOB elevated (30 degrees) Sitting - Scoot to Edge of Bed: 4: Min guard Sit to Supine: 6: Modified independent (Device/Increase time) Details for Bed Mobility Assistance: Once got to EOB could not let go with both UEs and still maintain balance Transfers Transfers: Sit to Stand;Stand to Sit Sit to Stand: 3: Mod assist;With upper extremity assist;From bed Stand to Sit: 3: Mod assist;With upper extremity assist;With armrests;To chair/3-in-1 Details for Transfer Assistance: Patient with difficulty initiating stand secondary to retropulsion with initiation. Once standing patient with heavy right and posterior bias.    Exercise    Balance Static Sitting Balance Static Sitting - Balance Support: Feet unsupported;Bilateral upper extremity supported Static Sitting - Level of Assistance: 5: Stand by assistance Static Sitting - Comment/# of Minutes: Secondary to posterior lean with manual muscle testing  End of Session OT - End of Session Equipment Utilized During Treatment: Gait belt Activity Tolerance: Patient tolerated treatment well Patient left: in chair;with call bell/phone within reach;with chair alarm set Nurse Communication: Mobility status (to nurse and nurse tech, 2 person A)   [name] [phone] 07/20/2011, 3:54 PM

## 2011-07-21 DIAGNOSIS — I6330 Cerebral infarction due to thrombosis of unspecified cerebral artery: Secondary | ICD-10-CM

## 2011-07-21 DIAGNOSIS — I369 Nonrheumatic tricuspid valve disorder, unspecified: Secondary | ICD-10-CM

## 2011-07-21 DIAGNOSIS — G459 Transient cerebral ischemic attack, unspecified: Secondary | ICD-10-CM

## 2011-07-21 MED ORDER — ASPIRIN 325 MG PO TABS: 325.0000 mg | ORAL_TABLET | Freq: Every day | ORAL | Status: DC

## 2011-07-21 NOTE — Progress Notes (Signed)
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 His copy of the CT scan and neurology evaluation is in the shadow file. Awaiting CIR- for inpatient rehabilitation Okay when they're ready- to transfer.

## 2011-07-21 NOTE — Progress Notes (Signed)
Per chart review, pt is hopeful to discharge to inpatient rehab. CSW spoke with inpatient rehab admissions regarding pt admission. CIR admission is awaiting MD evaluation of patient. CSW will speak with patient regarding snf as another option if patient is not elligible for rehab.   Catha Gosselin, [name]  [phone] .07/21/2011 1204pm

## 2011-07-21 NOTE — Care Management Note (Signed)
    Page 1 of 1   07/24/2011     12:18:59 PM   CARE MANAGEMENT NOTE 07/24/2011  Patient:  [name], [name]   Account Number:  [id number]  Date Initiated:  07/21/2011  Documentation initiated by:  GRAVES-BIGELOW,Asenath Balash  Subjective/Objective Assessment:   Pt admitted with CVA. Pt was from home with daughter. Plan for consult with CIR. CIR MD to see pt.     Action/Plan:   Anticipated DC Date:  07/24/2011   Anticipated DC Plan:  IP REHAB FACILITY  In-house referral  Clinical Social Worker      DC Planning Services  CM consult      Choice offered to / List presented to:             Status of service:  Completed, signed off Medicare Important Message given?   (If response is "NO", the following Medicare IM given date fields will be blank) Date Medicare IM given:   Date Additional Medicare IM given:    Discharge Disposition:  IP REHAB FACILITY  Per UR Regulation:    If discussed at Long Length of Stay Meetings, dates discussed:    Comments:  07-21-11 1421 Tomi Bamberger, RN,BSN [phone] CM will continue to monitor for disposition needs.

## 2011-07-21 NOTE — Clinical Social Work Psychosocial (Signed)
     Clinical Social Work Department BRIEF PSYCHOSOCIAL ASSESSMENT 07/21/2011  Patient:  [name], [name]     Account Number:  [id number]     Admit date:  07/19/2011  Clinical Social Worker:  Doree Albee  Date/Time:  07/21/2011 03:00 PM  Referred by:  Physician  Date Referred:  07/21/2011 Referred for  SNF Placement   Other Referral:   Interview type:  Patient Other interview type:   and patient granddaughter and grandson    PSYCHOSOCIAL DATA Living Status:  FAMILY Admitted from facility:   Level of care:   Primary support name:  [name] Rembert Primary support relationship to patient:  CHILD, ADULT Degree of support available:   strong, patient lives iwth pt daughter    CURRENT CONCERNS Current Concerns  Post-Acute Placement   Other Concerns:    SOCIAL WORK ASSESSMENT / PLAN CSW met iwth pt and pt family at bedside to discuss pt dc plans. Csw and pt, and pt family discussed patient dc plan of CIR and the requirement for patient to have 24 hour care after completing rehab at [facility].    Pt grandchildren stated that patient daughter had hired someone to provide care for pt when pt daughter had to work.    Pt gave csw permission to contact pt daughter to discuss further. Pt daughter had left work, and on the way to the hospital. CSW left contact information for family to give pt daughter as well as snf list as a back up plan if patient was not elligible for CIR.   Assessment/plan status:  Psychosocial Support/Ongoing Assessment of Needs Other assessment/ plan:   and d/c plans   Information/referral to community resources:   snf list    PATIENTS/FAMILYS RESPONSE TO PLAN OF CARE: Pt and pt family very appreciaitve of CSW concern and support. Pt family felt very confident that they could provide 24 hour care for pt at home with some arrangments if that meant pt could go to CIR.

## 2011-07-21 NOTE — Progress Notes (Signed)
  Echocardiogram 2D Echocardiogram has been performed.  Mercy Moore 07/21/2011, 9:37 AM

## 2011-07-21 NOTE — Clinical Social Work Placement (Signed)
     Clinical Social Work Department CLINICAL SOCIAL WORK PLACEMENT NOTE 07/24/2011  Patient:  [name], [name]  Account Number:  [id number] Admit date:  07/19/2011  Clinical Social Worker:  Doree Albee  Date/time:  07/21/2011 03:00 PM  Clinical Social Work is seeking post-discharge placement for this patient at the following level of care:   SKILLED NURSING   (*CSW will update this form in Epic as items are completed)   07/21/2011  Patient/family provided with [facility] Department of Clinical Social Works list of facilities offering this level of care within the geographic area requested by the patient (or if unable, by the patients family).  07/21/2011  Patient/family informed of their freedom to choose among providers that offer the needed level of care, that participate in Medicare, Medicaid or managed care program needed by the patient, have an available bed and are willing to accept the patient.  07/21/2011  Patient/family informed of [facility] ownership interest in [facility], as well as of the fact that they are under no obligation to receive care at this facility.  PASARR submitted to EDS on  PASARR number received from EDS on   FL2 transmitted to all facilities in geographic area requested by pt/family on   FL2 transmitted to all facilities within larger geographic area on   Patient informed that his/her managed care company has contracts with or will negotiate with  certain facilities, including the following:     Patient/family informed of bed offers received:   Patient chooses bed at  Physician recommends and patient chooses bed at    Patient to be transferred to  on   Patient to be transferred to facility by   The following physician request were entered in Epic:   Additional Comments: 07/24/2011 pt to dc to cir pending insurance auth. no further csw needs. sign off.

## 2011-07-21 NOTE — Progress Notes (Signed)
*  PRELIMINARY RESULTS* Vascular Ultrasound Carotid Duplex (Doppler) has been completed.   No evidence of internal carotid artery stenosis bilaterally. There is evidence of dampened external carotid artery waveforms bilaterally. Bilateral antegrade vertebral artery flow.  Malachy Moan, RDMS, RDCS 07/21/2011, 5:05 PM

## 2011-07-21 NOTE — Progress Notes (Signed)
UR Completed Nakia Koble Graves-Bigelow, RN,BSN [phone]  

## 2011-07-21 NOTE — Progress Notes (Signed)
Physical Therapy Treatment Patient Details Name: [name] MRN: [id number] DOB: 11/12/1924 Today's Date: 07/21/2011 Time: 8657-8469 PT Time Calculation (min): 24 min  PT Assessment / Plan / Recommendation Comments on Treatment Session  Excellent improvement in functional abilities, but remains with significant balance deficits that leave patient at increased falls risk. He is an excellent rehab candidate as has the potential to make the necessary progress with an intensive program.    Follow Up Recommendations  Inpatient Rehab    Barriers to Discharge  Daughter works, so supervision is PRN only      Equipment Recommendations  Defer to next venue    Recommendations for Other Services  None  Frequency Min 4X/week   Plan Discharge plan remains appropriate;Frequency remains appropriate    Precautions / Restrictions Precautions Precautions: Fall   Pertinent Vitals/Pain VSS/ No pain    Mobility  Bed Mobility Supine to Sit: 5: Supervision Sitting - Scoot to Edge of Bed: 5: Supervision Details for Bed Mobility Assistance: Improved initiation with mobility today. Improved stablity in sitting at edge of bed.  Transfers Sit to Stand: 3: Mod assist;With upper extremity assist;From bed Stand to Sit: To chair/3-in-1;4: Min assist;With armrests;With upper extremity assist Details for Transfer Assistance: Patient with difficulty initiating stand from lower surface so bed elevated and then patient able to achieve stand with minimal assistance. Patient requires assistance through range with stand and to stabilize once position assumed. He presents with less posterior bias today. Hand over hand for correct hand placement to and from device. Ambulation/Gait Ambulation/Gait Assistance: 3: Mod assist Ambulation Distance (Feet): 150 Feet Assistive device: Rolling walker Ambulation/Gait Assistance Details: Improved balance with gait especially in straight direction. However, patient with  immediate loss of balance with dual task conditions. For example; if patient attempts to look at PT (on right side) during gait he loses his balance posteriorly and requires PT to assist to correct. Also unable to talk at same time as walking - needs to stop or will lose balance. Patient requires increased assistance with  turning and backward stepping secondary to tendency to shuffle. Overall gait less ataxic today, base of support narrower, and less right lean.     PT Goals Acute Rehab PT Goals PT Goal: Sit to Stand - Progress: Progressing toward goal PT Goal: Stand to Sit - Progress: Progressing toward goal PT Goal: Stand - Progress: Progressing toward goal PT Goal: Ambulate - Progress: Progressing toward goal  Visit Information  Last PT Received On: 07/21/11 Assistance Needed: +1    Subjective Data  Subjective: Patient does not report dizziness Patient Stated Goal: Walk   Cognition  Overall Cognitive Status: Appears within functional limits for tasks assessed/performed Difficult to assess due to: Hard of hearing/deaf Arousal/Alertness: Awake/alert Orientation Level: Appears intact for tasks assessed Behavior During Session: [facility] for tasks performed    Balance  Static Sitting Balance Static Sitting - Balance Support: Feet supported;Bilateral upper extremity supported Static Sitting - Level of Assistance: 7: Independent Static Standing Balance Static Standing - Balance Support: Bilateral upper extremity supported Static Standing - Level of Assistance: 4: Min assist  End of Session PT - End of Session Equipment Utilized During Treatment: Gait belt Activity Tolerance: Patient tolerated treatment well Patient left: in chair;with chair alarm set;with call bell/phone within reach Nurse Communication: Mobility status    Edwyna Perfect, PT  Pager [phone]  07/21/2011, 11:05 AM

## 2011-07-21 NOTE — Discharge Summary (Signed)
Physician Discharge Summary  Patient ID: [name] MRN: [id number] DOB/AGE: [birth year] 78 y.o.  Admit date: 07/19/2011 Discharge date: 07/21/2011  Admission Diagnoses: Dizzy; confusion,loss of balance. 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 on MRA MRI showed chronic ischemic changes, acute right cerebral infarction Ataxia, gait abnormality due to stroke HTN (hypertension)  Hearing loss  Meningioma-incidental finding stable from 2002 History of BPH  Discharged Condition: good  Hospital Course: 78 years old male with history of hypertension, hearing loss admitted with acute dizziness change in her status and loss of balance Problem one: acute dizziness loss of balance and change in her status, CT scan of the head was negative he underwent MRI of the brain which showed acute right cerebellar infarct, MRA finding suggested acute occlusion of anterior right cerebral artery; chronic old ischemic changes. 2-D echocardiogram done results pending; carotid ultrasound pending History of meningioma which was again noted on the MRI of the frontal orbital area which was seen in 2002, asymptomatic at this time Patient evaluated by PT OT and speech, he had no trouble with speech and swallowing. Physical therapy recommended acute rehabilitation. Patient has significant gait abnormalities and ataxia because of the stroke. He was started on full dose aspirin 325 mg Problem #2: hypertension continue  current medications at home blood pressure stable Patient remained in normal sinus rhythm without any arrhythmias, cardiac markers negative blood work negative History of hearing loss continue her hearing aid History of BPH stable. Benefit from rehabilitation. Consults: rehabilitation medicine  Significant Diagnostic Studies: labs: all blood work including blood chemistries blood counts,  lipid profile, A1c, cardiac markers negative and radiology: CXR: normal, MRI: acute right cerebellar infarction. and CT scan: incidental meningioma finding of the frontal  Treatments: therapies: PT and OT  Discharge Exam: Blood pressure 163/84, pulse 80, temperature 98 F (36.7 C), temperature source Oral, resp. rate 16, height 5\' 7"  (1.702 m), weight 77.883 kg (171 lb 11.2 oz), SpO2 97.00%. General appearance: alert Resp: clear to auscultation bilaterally Cardio: regular rate and rhythm Neurologic: Gait: Abnormal  Disposition: discharge to rehabilitation.  Medication List  As of 07/21/2011 10:33 AM   ASK your doctor about these medications         amLODipine-benazepril 10-20 MG per capsule   Commonly known as: LOTREL   Take 1 capsule by mouth daily.             SignedGeorgann Housekeeper 07/21/2011, 10:33 AM

## 2011-07-21 NOTE — Progress Notes (Signed)
Subjective: Pt with gait disorder MRA with anterior occulion of cerebelalr right Echo and carotid pend- today? Need PT and OT CIR if possible today or tomorrow ok Medically stable otherwise Mild wax in both ears-  Hearing AID in left-  Objective: Vital signs in last 24 hours: Temp:  [97.9 F (36.6 C)-98.9 F (37.2 C)] 98 F (36.7 C) (05/10 0500) Pulse Rate:  [68-80] 80  (05/10 0500) Resp:  [16-18] 16  (05/10 0500) BP: (123-163)/(57-84) 163/84 mmHg (05/10 0500) SpO2:  [96 %-98 %] 97 % (05/10 0500) Weight change:     Intake/Output from previous day: 05/09 0701 - 05/10 0700 In: -  Out: 800 [Urine:800] Intake/Output this shift:    General appearance: alert Resp: clear to auscultation bilaterally Cardio: regular rate and rhythm Neurologic: Gait: Abnormal  Lab Results:  Basename 07/19/11 1519  WBC 8.5  HGB 13.6  HCT 39.2  PLT 144*   BMET  Basename 07/19/11 1519  NA 139  K 4.6  CL 102  CO2 25  GLUCOSE 152*  BUN 11  CREATININE 0.62  CALCIUM 10.5    Studies/Results: Ct Head Wo Contrast  07/19/2011  *RADIOLOGY REPORT*  Clinical Data: Weakness.  Slurred speech.  CT HEAD WITHOUT CONTRAST  Technique:  Contiguous axial images were obtained from the base of the skull through the vertex without contrast.  Comparison: None.  Findings: There is a hyperattenuating mass in the inferior left frontal lobe (image 8 series 2) measuring 26 mm AP by 23 mm transverse.  This extends to the floor of the left anterior cranial fossa.  Hounsfield attenuation units of 43 are present within the mass.  There is surrounding edema.  There are no other mass lesions identified.  No midline shift.  No hydrocephalus.  Chronic ischemic white matter disease and atrophy.  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  There is no intraventricular blood.  No convincing evidence of hyperostosis of the floor of the anterior cranial fossa adjacent to the mass lesion.  Intracranial atherosclerosis is  noted. Old right occipital lobe infarct is also noted.  IMPRESSION: 1.  Inferior left frontal lobe mass lesion measuring 26 mm x 23 mm. Differential considerations include subacute hemorrhage or meningioma arising from the floor of the anterior cranial fossa. Inferior left frontal lobe subacute infarct with subsequent hemorrhagic transformation may account for the appearance, particularly in the setting of right cerebellar and right occipital infarcts.  Hemorrhagic mass is not excluded.  Consider follow-up MRI with without infusion for further evaluation although this can be followed with CT to see if the mass shows expected evolution of hemorrhage. 2.  Underlying atrophy and chronic ischemic white matter disease. Old right cerebellar infarct. 3. Critical Value/emergent results were called by telephone at the time of interpretation on 07/19/2011  at 1725 hours  to  Dr. Bethann Berkshire, who verbally acknowledged these results.  Original Report Authenticated By: Andreas Newport, M.D.   [name] GN Contrast  07/19/2011  *RADIOLOGY REPORT*  Clinical Data: Weakness.  Slurred speech.  Abnormal CT head  MRI HEAD WITHOUT AND WITH CONTRAST  Technique:  Multiplanar, multiecho pulse sequences of the brain and surrounding structures were obtained according to standard protocol without and with intravenous contrast  Contrast: 15mL MULTIHANCE GADOBENATE DIMEGLUMINE 529 MG/ML IV SOLN  Comparison: C t head prior 08/13  Findings: Acute infarct in the right superior and lateral cerebellum.  No other acute infarct.  Enhancing mass lesion left frontal lobe.  This shows homogeneous enhancement and  has  attachment to the orbital roof.  This is compatible with a meningioma.  This measures 2.7 x 2.0 x 2.2 cm. There is surrounding white matter edema.  No midline shift.  Generalized atrophy.  Moderate chronic ischemic changes in the white matter.  Chronic ischemia in the right thalamus and pons bilaterally.  Chronic infarcts in the  cerebellum bilaterally.  IMPRESSION: Acute infarct right superior and lateral cerebellum.  Meningioma of the left orbital roof.  Atrophy and moderate to advanced chronic ischemic changes.  Original Report Authenticated By: Camelia Phenes, M.D.   Dg Chest Port 1 View  07/19/2011  *RADIOLOGY REPORT*  Clinical Data: Weakness, question stroke  PORTABLE CHEST - 1 VIEW  Comparison: Portable exam 1555 hours without priors for comparison  Findings: Minimal enlargement cardiac silhouette. Tortuous aorta. Pulmonary vascularity normal. Lungs clear. No pleural effusion or pneumothorax. No definite acute osseous findings.  IMPRESSION: Enlargement of cardiac silhouette. No acute abnormalities.  Original Report Authenticated By: Lollie Marrow, M.D.   Mr Mra Head/brain Wo Cm  07/20/2011  *RADIOLOGY REPORT*  Clinical Data: Stroke.  MRA HEAD WITHOUT CONTRAST  Technique: Angiographic images of the Circle of Willis were obtained using MRA technique without intravenous contrast.  Comparison: MRI head 07/19/2011  Findings: Both vertebral arteries are patent to the basilar.  Left PICA is patent.  Right PICA not visualized.  Right superior cerebellar artery is occluded at the origin.  This is compatible with the acute infarct.  Left superior cerebellar artery is patent. Posterior cerebral arteries are patent bilaterally.  Internal carotid artery is patent bilaterally without significant stenosis.  Anterior and middle cerebral arteries are patent bilaterally.  Negative for cerebral aneurysm.  IMPRESSION: Proximal occlusion of the right superior cerebellar artery compatible with acute infarction noted on MRI yesterday.  Right PICA is not present.  This territory may be supplied from the left PICA which is a large vessel.  Original Report Authenticated By: Camelia Phenes, M.D.    Medications: I have reviewed the patient's current medications.  Assessment/Plan: Acute CVA- Right crebellar MRA consstant with occlusion anterior  Echo  and coratid for completion of w/u- won't change any tx Continue ASA BP control PT/OT rehab If CIR possible- if not will need SNF -Rehab    LOS: 2 days   [name] 07/21/2011, 7:46 AM

## 2011-07-22 LAB — URINALYSIS, ROUTINE W REFLEX MICROSCOPIC
Bilirubin Urine: NEGATIVE
Glucose, UA: NEGATIVE mg/dL
Ketones, ur: NEGATIVE mg/dL
Nitrite: NEGATIVE
Protein, ur: NEGATIVE mg/dL
Specific Gravity, Urine: 1.02 (ref 1.005–1.030)
Urobilinogen, UA: 0.2 mg/dL (ref 0.0–1.0)
pH: 6 (ref 5.0–8.0)

## 2011-07-22 LAB — URINE MICROSCOPIC-ADD ON

## 2011-07-22 NOTE — Progress Notes (Signed)
Subjective: No complaints  Objective: Vital signs in last 24 hours: Temp:  [97.8 F (36.6 C)-101.3 F (38.5 C)] 98.4 F (36.9 C) (05/11 0800) Pulse Rate:  [78-92] 85  (05/11 0800) Resp:  [18-20] 20  (05/11 0800) BP: (109-144)/(73-89) 137/77 mmHg (05/11 0800) SpO2:  [90 %-96 %] 92 % (05/11 0800) Weight change:     Intake/Output from previous day: 05/10 0701 - 05/11 0700 In: -  Out: 850 [Urine:850] Intake/Output this shift: Total I/O In: 120 [P.O.:120] Out: 475 [Urine:475]  General appearance: alert and cooperative Resp: clear to auscultation bilaterally Cardio: regular rate and rhythm, S1, S2 normal, no murmur, click, rub or gallop GI: soft, non-tender; bowel sounds normal; no masses,  no organomegaly Extremities: extremities normal, atraumatic, no cyanosis or edema  Lab Results:  Basename 07/19/11 1519  WBC 8.5  HGB 13.6  HCT 39.2  PLT 144*   BMET  Basename 07/19/11 1519  NA 139  K 4.6  CL 102  CO2 25  GLUCOSE 152*  BUN 11  CREATININE 0.62  CALCIUM 10.5    Studies/Results: No results found.  Medications: I have reviewed the patient's current medications.  Assessment/Plan: Active Problems:  HTN (hypertension) reasonable control  CVA (cerebral infarction) stable, therapy, awaiting rehab placement  Hearing loss  Meningioma  Low grade fever, no sign or symptoms of infection.  Check ua and culture,follow   LOS: 3 days   [name] 07/22/2011, 10:51 AM

## 2011-07-23 NOTE — Progress Notes (Signed)
Subjective: NO complaints  Objective: Vital signs in last 24 hours: Temp:  [98.2 F (36.8 C)-98.7 F (37.1 C)] 98.6 F (37 C) (05/12 0800) Pulse Rate:  [82-109] 89  (05/12 0800) Resp:  [18-22] 20  (05/12 0800) BP: (126-143)/(68-87) 140/76 mmHg (05/12 0800) SpO2:  [92 %-96 %] 93 % (05/12 0800) Weight change:  Last BM Date: 07/21/11  Intake/Output from previous day: 05/11 0701 - 05/12 0700 In: 480 [P.O.:480] Out: 725 [Urine:725] Intake/Output this shift: Total I/O In: -  Out: 375 [Urine:375]  General appearance: alert and cooperative  Lab Results: No results found for this basename: WBC:2,HGB:2,HCT:2,PLT:2 in the last 72 hours BMET No results found for this basename: NA:2,K:2,CL:2,CO2:2,GLUCOSE:2,BUN:2,CREATININE:2,CALCIUM:2 in the last 72 hours  Studies/Results: No results found.  Medications: I have reviewed the patient's current medications.  Assessment/Plan: Active Problems:  HTN (hypertension) reasonable control  CVA (cerebral infarction) stable, awaiting placement  Hearing loss  Meningioma   LOS: 4 days   [name] JOSEPH 07/23/2011, 11:04 AM

## 2011-07-24 ENCOUNTER — Inpatient Hospital Stay (HOSPITAL_COMMUNITY)
Admission: RE | Admit: 2011-07-24 | Discharge: 2011-08-15 | DRG: 945 | Disposition: A | Discharge status: Home Health | Payer: PRIVATE HEALTH INSURANCE | Source: Ambulatory Visit | Attending: Physical Medicine & Rehabilitation | Admitting: Physical Medicine & Rehabilitation

## 2011-07-24 DIAGNOSIS — Z5189 Encounter for other specified aftercare: Principal | ICD-10-CM

## 2011-07-24 DIAGNOSIS — K5900 Constipation, unspecified: Secondary | ICD-10-CM

## 2011-07-24 DIAGNOSIS — D320 Benign neoplasm of cerebral meninges: Secondary | ICD-10-CM

## 2011-07-24 DIAGNOSIS — I10 Essential (primary) hypertension: Secondary | ICD-10-CM | POA: Diagnosis present

## 2011-07-24 DIAGNOSIS — Z7982 Long term (current) use of aspirin: Secondary | ICD-10-CM

## 2011-07-24 DIAGNOSIS — A0472 Enterocolitis due to Clostridium difficile, not specified as recurrent: Secondary | ICD-10-CM

## 2011-07-24 DIAGNOSIS — I6330 Cerebral infarction due to thrombosis of unspecified cerebral artery: Secondary | ICD-10-CM

## 2011-07-24 DIAGNOSIS — Z79899 Other long term (current) drug therapy: Secondary | ICD-10-CM

## 2011-07-24 DIAGNOSIS — H9190 Unspecified hearing loss, unspecified ear: Secondary | ICD-10-CM

## 2011-07-24 DIAGNOSIS — R42 Dizziness and giddiness: Secondary | ICD-10-CM

## 2011-07-24 DIAGNOSIS — I6350 Cerebral infarction due to unspecified occlusion or stenosis of unspecified cerebral artery: Secondary | ICD-10-CM

## 2011-07-24 DIAGNOSIS — I639 Cerebral infarction, unspecified: Secondary | ICD-10-CM | POA: Diagnosis present

## 2011-07-24 DIAGNOSIS — Z8673 Personal history of transient ischemic attack (TIA), and cerebral infarction without residual deficits: Secondary | ICD-10-CM | POA: Diagnosis present

## 2011-07-24 LAB — URINE CULTURE
Colony Count: 95000
Culture  Setup Time: 201305120206

## 2011-07-24 MED ORDER — BISACODYL 10 MG RE SUPP: 10.0000 mg | Freq: Every day | RECTAL | Status: DC | PRN

## 2011-07-24 MED ORDER — POLYETHYLENE GLYCOL 3350 17 G PO PACK
17.0000 g | PACK | Freq: Every day | ORAL | Status: DC
  Administered 2011-07-24 – 2011-08-15 (×19): 17 g via ORAL
  Filled 2011-07-24 (×24): qty 1

## 2011-07-24 MED ORDER — TRAZODONE HCL 50 MG PO TABS: 25.0000 mg | ORAL_TABLET | Freq: Every evening | ORAL | Status: DC | PRN

## 2011-07-24 MED ORDER — SENNOSIDES-DOCUSATE SODIUM 8.6-50 MG PO TABS: 1.0000 | ORAL_TABLET | Freq: Every evening | ORAL | Status: DC | PRN

## 2011-07-24 MED ORDER — AMLODIPINE BESYLATE 10 MG PO TABS
10.0000 mg | ORAL_TABLET | Freq: Every day | ORAL | Status: DC
  Administered 2011-07-25 – 2011-08-15 (×22): 10 mg via ORAL
  Filled 2011-07-24 (×23): qty 1

## 2011-07-24 MED ORDER — ACETAMINOPHEN 325 MG PO TABS: 325.0000 mg | ORAL_TABLET | ORAL | Status: DC | PRN

## 2011-07-24 MED ORDER — PROCHLORPERAZINE MALEATE 5 MG PO TABS
5.0000 mg | ORAL_TABLET | Freq: Four times a day (QID) | ORAL | Status: DC | PRN
  Filled 2011-07-24: qty 2

## 2011-07-24 MED ORDER — BISACODYL 10 MG RE SUPP
10.0000 mg | Freq: Once | RECTAL | Status: AC
  Administered 2011-07-24: 10 mg via RECTAL
  Filled 2011-07-24: qty 1

## 2011-07-24 MED ORDER — PROCHLORPERAZINE 25 MG RE SUPP
12.5000 mg | Freq: Four times a day (QID) | RECTAL | Status: DC | PRN
  Filled 2011-07-24: qty 1

## 2011-07-24 MED ORDER — BENAZEPRIL HCL 20 MG PO TABS
20.0000 mg | ORAL_TABLET | Freq: Every day | ORAL | Status: DC
  Administered 2011-07-25 – 2011-08-05 (×12): 20 mg via ORAL
  Filled 2011-07-24 (×13): qty 1

## 2011-07-24 MED ORDER — FLEET ENEMA 7-19 GM/118ML RE ENEM
1.0000 | ENEMA | Freq: Once | RECTAL | Status: AC | PRN
  Filled 2011-07-24: qty 1

## 2011-07-24 MED ORDER — ALUM & MAG HYDROXIDE-SIMETH 200-200-20 MG/5ML PO SUSP: 30.0000 mL | ORAL | Status: DC | PRN

## 2011-07-24 MED ORDER — ENOXAPARIN SODIUM 40 MG/0.4ML ~~LOC~~ SOLN
40.0000 mg | SUBCUTANEOUS | Status: DC
  Administered 2011-07-24 – 2011-08-14 (×22): 40 mg via SUBCUTANEOUS
  Filled 2011-07-24 (×23): qty 0.4

## 2011-07-24 MED ORDER — GUAIFENESIN-DM 100-10 MG/5ML PO SYRP: 5.0000 mL | ORAL_SOLUTION | Freq: Four times a day (QID) | ORAL | Status: DC | PRN

## 2011-07-24 MED ORDER — ASPIRIN 325 MG PO TABS
325.0000 mg | ORAL_TABLET | Freq: Every day | ORAL | Status: DC
  Administered 2011-07-25 – 2011-08-15 (×22): 325 mg via ORAL
  Filled 2011-07-24 (×23): qty 1

## 2011-07-24 MED ORDER — PROCHLORPERAZINE EDISYLATE 5 MG/ML IJ SOLN
5.0000 mg | Freq: Four times a day (QID) | INTRAMUSCULAR | Status: DC | PRN
  Filled 2011-07-24: qty 2

## 2011-07-24 NOTE — Progress Notes (Signed)
CSW spoke with rehab admissions, who confirmed that patient plans to dc to CIR pending insurance authorization later today. No further csw needs at this time, please reconsult if needed.   [name], [name]  [phone] .07/24/2011 9:33am

## 2011-07-24 NOTE — PMR Pre-admission (Signed)
PMR Admission Coordinator Pre-Admission Assessment  Patient: [name] is an 78 y.o., male MRN: [id number] DOB: 06/11/1924 Height: 5\' 7"  (170.2 cm) Weight: 77.883 kg (171 lb 11.2 oz)  Insurance Information HMO:Yes   PPO:       PCP:       IPA:       80/20:       OTHER: Group # [id number] PRIMARY: [facility]      Policy#: [id number]      Subscriber: Leane Platt CM Name: Pennelope Bracken      Phone#: [phone]     Fax#: [phone] Pre-Cert#: [id number]      Employer: Retired Benefits:  Phone #: [phone]     Name: Lucianne Lei. Date: 03/13/10     Deduct: $147(met)      Out of Pocket Max: $6700(met $151)      Life Max: unlimited CIR: $1184 admit deductible      SNF: $0 1-20d, $148 days 21-100 Outpatient: 80%     Co-Pay: 20% Home Health: 100%      Co-Pay: none DME: 80%     Co-Pay: 20% Providers: in network  Emergency Contact Information Contact Information    Name Relation Home Work Mobile   Rembert,[name] Daughter [phone]       Current Medical History  Patient Admitting Diagnosis:  R cerebellar infarct  History of Present Illness:  Right handed male with history of HTN, HOH, admitted 05/08 with Lethargy, slurred speech and difficulty walking. 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 MRA brain revealead proximal occlusion R-SCA c/w acute infarct. Patient started on ASA for CVA prophylaxis. PT/OT/ST evaluations done 05/09 and patient noted to have difficulty initiating movements with heavy right and posterior lean and minimal dysarthria.  Total: 0   Past Medical History  Past Medical History  Diagnosis Date  . Hypertension   . Difficulty hearing, right     Family History  family history includes Diabetes in his mother and Heart disease in his mother.  Prior Rehab/Hospitalizations: No previous rehab admissions.   Current Medications  Current facility-administered  medications: stroke: mapping our early stages of recovery book, , Does not apply, Once, Nishant Dhungel, MD;  acetaminophen (TYLENOL) suppository 650 mg, 650 mg, Rectal, Q4H PRN, Therisa Doyne, MD;  acetaminophen (TYLENOL) tablet 650 mg, 650 mg, Oral, Q4H PRN, Therisa Doyne, MD, 650 mg at 07/23/11 1623;  amLODipine (NORVASC) tablet 10 mg, 10 mg, Oral, Daily, Therisa Doyne, MD, 10 mg at 07/24/11 1012 aspirin tablet 325 mg, 325 mg, Oral, Daily, Therisa Doyne, MD, 325 mg at 07/24/11 1012;  benazepril (LOTENSIN) tablet 20 mg, 20 mg, Oral, Daily, Therisa Doyne, MD, 20 mg at 07/24/11 1012;  ondansetron (ZOFRAN) injection 4 mg, 4 mg, Intravenous, Q6H PRN, Therisa Doyne, MD;  senna-docusate (Senokot-S) tablet 1 tablet, 1 tablet, Oral, QHS PRN, Therisa Doyne, MD  Patients Current Diet: Cardiac  Precautions / Restrictions Precautions Precautions: Fall Restrictions Weight Bearing Restrictions: No   Prior Activity Level Community (5-7x/wk): Goes to dtrs every other day to visit.  Home Assistive Devices / Equipment Home Assistive Devices/Equipment: None Home Adaptive Equipment: None  Prior Functional Level Prior Function Level of Independence: Independent Able to Take Stairs?: Yes Driving: Yes Vocation: Retired  Current Functional Level Cognition  Arousal/Alertness: Awake/alert Overall Cognitive Status: Appears within functional limits for tasks assessed (Required minimal occasional prompts but overall, WFL) Overall Cognitive Status: Impaired Difficult to assess due to: Hard  of hearing/deaf Current Attention Level: Selective Attention - Other Comments: Selective attention until out in hallways with gait and then patient becomes very distracted which affects his quality of gait - particularly as ambulated at busy time in the hallways. Orientation Level: Oriented X4 Attention:  (SUSTAINED ATTENTION) Memory: Appears intact Awareness: Appears intact Problem  Solving: Appears intact Safety/Judgment: Appears intact    Extremity Assessment (includes Sensation/Coordination)  RUE ROM/Strength/Tone: Deficits RUE ROM/Strength/Tone Deficits: Ataxic RUE Coordination: Deficits RUE Coordination Deficits: Ataxia for both FM and GM movements  RLE ROM/Strength/Tone: Within functional levels RLE Sensation: WFL - Light Touch;Deficits RLE Sensation Deficits: Propriception diminished RLE Coordination: Deficits    ADLs  Eating/Feeding: Simulated;Set up (with increased time due to ataxia RUE) Where Assessed - Eating/Feeding: Bed level Grooming: Simulated;Set up (with increased time due to ataxia RUE) Where Assessed - Grooming: Supported sitting Upper Body Bathing: Simulated;Set up (with increased time due to ataxia RUE) Where Assessed - Upper Body Bathing: Supported;Sitting, chair Lower Body Bathing: Simulated;Maximal assistance (due to decreased ability for sit to stand, standing balance,) Where Assessed - Lower Body Bathing: Supported;Sit to stand from chair Upper Body Dressing: Simulated;Moderate assistance (due to ataxia of RUE) Where Assessed - Upper Body Dressing: Supported;Sitting, chair Lower Body Dressing: Maximal assistance;Performed (due to decreased ability for sit to stand, standing balance,) Where Assessed - Lower Body Dressing: Supported;Sit to stand from chair Toilet Transfer: Simulated;Maximal assistance (bed to chair 2 feet apart) Toilet Transfer Method: Stand pivot Toileting - Clothing Manipulation: Performed;Maximal assistance (due to balance deficts and RUE ataxia) Where Assessed - Toileting Clothing Manipulation: Standing Toileting - Hygiene: Simulated;Moderate assistance Tub/Shower Transfer: Not assessed Tub/Shower Transfer Method: Not assessed Equipment Used: Gait belt Ambulation Related to ADLs: Unable to do safely with 1 person A    Mobility  Bed Mobility: Supine to Sit;Sitting - Scoot to Edge of Bed Rolling Left: 6: Modified  independent (Device/Increase time) Left Sidelying to Sit: 6: Modified independent (Device/Increase time) Supine to Sit: 5: Supervision Sitting - Scoot to Edge of Bed: 5: Supervision Sit to Supine: 6: Modified independent (Device/Increase time)    Transfers  Transfers: Sit to Stand;Stand to Sit Sit to Stand: From bed;From chair/3-in-1;3: Mod assist;With upper extremity assist Stand to Sit: To chair/3-in-1;4: Min assist;With upper extremity assist    Ambulation / Gait / Stairs / Psychologist, prison and probation services  Ambulation/Gait Ambulation/Gait Assistance: 2: Max Environmental consultant (Feet): 100 Feet Assistive device: Rolling walker Ambulation/Gait Assistance Details: Up to max. assistance today with gait secondary to poor swing phase of right leg - dragging with poor foot clearance. Also with poor posture and walking too far behind walker and therefore required constant verbal and tactile cues to correct. Patient's quality of gait deteriorated in busy environment secondary to poor attention to task and easilt distracted. Patient also required facilitation to prevent right lean. Gait Pattern: Step-through pattern;Decreased stride length;Decreased hip/knee flexion - right;Lateral trunk lean to right    Posture / Balance Static Sitting Balance Static Sitting - Balance Support: Feet supported;Bilateral upper extremity supported Static Sitting - Level of Assistance: 7: Independent Static Sitting - Comment/# of Minutes: Secondary to posterior lean with manual muscle testing Static Standing Balance Static Standing - Balance Support: No upper extremity supported Static Standing - Level of Assistance: 4: Min assist Static Standing - Comment/# of Minutes: Worked in quasi-static standing on balance maintenance without upper extremity support. Posterior bias that requires correction from PT.     Previous Home Environment Living Arrangements: Alone Lives With: Alone Type of  Home: House Home Layout: One  level Home Access: Stairs to enter [employer] of Steps: 5 with a rail at back, 2 at fron with no rail Bathroom Shower/Tub: Tub/shower unit (usually takes tub baths) Bathroom Toilet: Standard Bathroom Accessibility: Yes How Accessible: Accessible via walker Home Care Services: No  Discharge Living Setting Plans for Discharge Living Setting: House (Will discharge home with daughter.) Type of Home at Discharge: House Discharge Home Layout: One level Discharge Home Access: Ramped entrance Do you have any problems obtaining your medications?: No  Social/Family/Support Systems Patient Roles: Parent Contact Information: Lowella Petties - Dtr (h) [phone] (w) [phone] ([phone] Anticipated Caregiver: Lazaro Arms and family Ability/Limitations of Caregiver: Dtr works. [employer] can assist. Can hire help as needed. Caregiver Availability: 24/7 Discharge Plan Discussed with Primary Caregiver: Yes Is Caregiver In Agreement with Plan?: Yes Does Caregiver/Family have Issues with Lodging/Transportation while Pt is in Rehab?: No  Goals/Additional Needs Patient/Family Goal for Rehab: PT/OT S/Min A, ST S goals Expected length of stay: 2-3 weeks Cultural Considerations: Catholic Dietary Needs: Heart, thin liquids Equipment Needs: TBD Pt/Family Agrees to Admission and willing to participate: Yes Program Orientation Provided & Reviewed with Pt/Caregiver Including Roles  & Responsibilities: Yes  Patient Condition: Please see physician update to information in consult dated  07/21/11.  Preadmission Screen Completed By:  Trish Mage, 07/24/2011 11:04 AM ______________________________________________________________________   Discussed status with Dr. Riley Kill on 07/24/11 at 1148 and received telephone approval for admission today.  Admission Coordinator:  Trish Mage, time 1149/Date 07/24/11

## 2011-07-24 NOTE — Progress Notes (Signed)
UR Completed Dody Smartt Graves-Bigelow, RN,BSN [phone]  

## 2011-07-24 NOTE — H&P (Signed)
Physical Medicine and Rehabilitation Admission H&P  Chief Complaint   Patient presents with   .  Confusion, slurred speech, problems walking   :  HPI: [name] is an 78 y.o. Right handed male with history of HTN, HOH, admitted 05/08 with Lethargy, slurred speech and difficulty walking. 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 MRA brain revealead proximal occlusion R-SCA c/w acute infarct. Patient started on ASA for CVA prophylaxis. Therapies ongoing and patient noted to have right lean with easily distractibility and poor attention to tasks. Dizziness improving.  Review of Systems  HENT: Positive for hearing loss. Negative for neck pain.  Eyes: Negative for blurred vision and double vision.  Respiratory: Negative for cough and shortness of breath.  Cardiovascular: Negative for chest pain and palpitations.  Gastrointestinal: Positive for constipation. Negative for heartburn and abdominal pain.  Genitourinary: Negative for urgency and frequency.  Musculoskeletal: Negative for back pain and joint pain.  Neurological: Positive for dizziness (intially with standing.). Negative for headaches.   Past Medical History   Diagnosis  Date   .  Hypertension    .  Difficulty hearing, right     History reviewed. No pertinent past surgical history.  Family History   Problem  Relation  Age of Onset   .  Diabetes  Mother    .  Heart disease  Mother     Social History: Lives alone. 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 Has been staying with daughter past few days due to the weather. He reports that he has never smoked. He does not have any smokeless tobacco history on file. He reports that he does not drink alcohol or use illicit drugs. Daughter works full time. Has a step daughter "Carney Bern" who's retired-can provide supervision past discharge.  Allergies: No Known  Allergies  Scheduled Meds:  .  stroke: mapping our early stages of recovery book   Does not apply  Once   .  amLODipine  10 mg  Oral  Daily   .  aspirin  325 mg  Oral  Daily   .  benazepril  20 mg  Oral  Daily    Medications Prior to Admission   Medication  Sig  Dispense  Refill   .  amLODipine-benazepril (LOTREL) 10-20 MG per capsule  Take 1 capsule by mouth daily.      Home:  Home Living  Lives With: Alone  Available Help at Discharge: Family;Available PRN/intermittently  Type of Home: House  Home Access: Stairs to enter  [employer] of Steps: 5 with a rail at back, 2 at fron with no rail  Home Layout: One level  Bathroom Shower/Tub: Tub/shower unit (usually takes tub baths)  Bathroom Toilet: Standard  Bathroom Accessibility: Yes  How Accessible: Accessible via walker  Home Adaptive Equipment: None  Functional History:  Prior Function  Able to Take Stairs?: Yes  Driving: Yes  Vocation: Retired  Functional Status:  Mobility:  Bed Mobility  Bed Mobility: Supine to Sit;Sitting - Scoot to [place] of Bed  Rolling Left: 6: Modified independent (Device/Increase time)  Left Sidelying to Sit: 6: Modified independent (Device/Increase time)  Supine to Sit: 5: Supervision  Sitting - Scoot to Edge of Bed: 5: Supervision  Sit to Supine: 6: Modified independent (Device/Increase time)  Transfers  Transfers: Sit to Stand;Stand to Sit  Sit to Stand: From bed;From chair/3-in-1;3: Mod  assist;With upper extremity assist  Stand to Sit: To chair/3-in-1;4: Min assist;With upper extremity assist  Ambulation/Gait  Ambulation/Gait Assistance: 2: Max assist  Ambulation Distance (Feet): 100 Feet  Assistive device: Rolling walker  Ambulation/Gait Assistance Details: Up to max. assistance today with gait secondary to poor swing phase of right leg - dragging with poor foot clearance. Also with poor posture and walking too far behind walker and therefore required constant verbal and tactile  cues to correct. Patient's quality of gait deteriorated in busy environment secondary to poor attention to task and easilt distracted. Patient also required facilitation to prevent right lean.  Gait Pattern: Step-through pattern;Decreased stride length;Decreased hip/knee flexion - right;Lateral trunk lean to right   ADL:  ADL  Eating/Feeding: Simulated;Set up (with increased time due to ataxia RUE)  Where Assessed - Eating/Feeding: Bed level  Grooming: Simulated;Set up (with increased time due to ataxia RUE)  Where Assessed - Grooming: Supported sitting  Upper Body Bathing: Simulated;Set up (with increased time due to ataxia RUE)  Where Assessed - Upper Body Bathing: Supported;Sitting, chair  Lower Body Bathing: Simulated;Maximal assistance (due to decreased ability for sit to stand, standing balance,)  Where Assessed - Lower Body Bathing: Supported;Sit to stand from chair  Upper Body Dressing: Simulated;Moderate assistance (due to ataxia of RUE)  Where Assessed - Upper Body Dressing: Supported;Sitting, chair  Lower Body Dressing: Maximal assistance;Performed (due to decreased ability for sit to stand, standing balance,)  Where Assessed - Lower Body Dressing: Supported;Sit to stand from chair  Toilet Transfer: Simulated;Maximal assistance (bed to chair 2 feet apart)  Toilet Transfer Method: Stand pivot  Toileting - Clothing Manipulation: Performed;Maximal assistance (due to balance deficts and RUE ataxia)  Where Assessed - Toileting Clothing Manipulation: Standing  Toileting - Hygiene: Simulated;Moderate assistance  Tub/Shower Transfer: Not assessed  Tub/Shower Transfer Method: Not assessed  Equipment Used: Gait belt  Ambulation Related to ADLs: Unable to do safely with 1 person A  Cognition:  Cognition  Overall Cognitive Status: Appears within functional limits for tasks assessed (Required minimal occasional prompts but overall, WFL)  Arousal/Alertness: Awake/alert  Orientation Level:  Oriented X4  Attention: (SUSTAINED ATTENTION)  Memory: Appears intact  Awareness: Appears intact  Problem Solving: Appears intact  Safety/Judgment: Appears intact  Cognition  Overall Cognitive Status: Impaired  Area of Impairment: Attention  Difficult to assess due to: Hard of hearing/deaf  Arousal/Alertness: Awake/alert  Orientation Level: Appears intact for tasks assessed  Behavior During Session: [facility] for tasks performed  Current Attention Level: Selective  Attention - Other Comments: Selective attention until out in hallways with gait and then patient becomes very distracted which affects his quality of gait - particularly as ambulated at busy time in the hallways.  Blood pressure 122/78, pulse 91, temperature 98.2 F (36.8 C), temperature source Oral, resp. rate 18, height 5\' 7"  (1.702 m), weight 77.883 kg (171 lb 11.2 oz), SpO2 97.00%.  Physical Exam  Nursing note and vitals reviewed.  Constitutional: He is oriented to person, place, and time. He appears well-developed and well-nourished.  HENT: Head: Normocephalic and atraumatic.  Mouth/Throat: Oropharynx is clear and moist Eyes: Pupils are equal, round, and reactive to light. EOMI Neck: Normal range of motion. Neck supple.  Cardiovascular: Normal rate and regular rhythm. No m,r,g  Pulmonary/Chest: Effort normal and breath sounds normal.  Abdominal: Soft. Bowel sounds are normal. He exhibits no distension. There is no tenderness.  Musculoskeletal: He exhibits no edema and no tenderness.  Vertiligo noted on bilateral shins  Neurological: He  is alert and oriented to person, place, and time.  Speech with mild - moderate dysarthria. (baseline speech issues?)  Very HOH. Follows basic commands without difficulty. Able to state date,day, year but not the month. President "Oprah" needed cues to correct-?expressive difficulty. Moves all four. Significant limb ataxia of RUE and RLE's  Skin: Skin is warm and dry.   No results found.    Post Admission Physician Evaluation:  1. Functional deficits secondary to right cerebellar infarcts. 2. Patient is admitted to receive collaborative, interdisciplinary care between the physiatrist, rehab nursing staff, and therapy team. 3. Patient's level of medical complexity and substantial therapy needs in context of that medical necessity cannot be provided at a lesser intensity of care such as a SNF. 4. Patient has experienced substantial functional loss from his/her baseline which was documented above under the "Functional History" and "Functional Status" headings. Judging by the patient's diagnosis, physical exam, and functional history, the patient has potential for functional progress which will result in measurable gains while on inpatient rehab. These gains will be of substantial and practical use upon discharge in facilitating mobility and self-care at the household level. 5. Physiatrist will provide 24 hour management of medical needs as well as oversight of the therapy plan/treatment and provide guidance as appropriate regarding the interaction of the two. 6. 24 hour rehab nursing will assist with bladder management, bowel management, safety, skin/wound care, disease management, medication administration and patient education and help integrate therapy concepts, techniques,education, etc. 7. PT will assess and treat for: balance, fmc, adaptive equipment, fxnl mobility, NMR, and safety. Goals are: minimal assist. 8. OT will assess and treat for: balance, fmc, adaptive equipment, fxnl mobility, ADL's, safety NMR. Goals are: supervision to moderate assist. 9. SLP will assess and treat for: speech intelligibility and communication. Goals are: min asssit. 10. Case Management and Social Worker will assess and treat for psychological issues and discharge planning. 11. Team conference will be held weekly to assess progress toward goals and to determine barriers to discharge. 12. Patient will  receive at least 3 hours of therapy per day at least 5 days per week. 13. ELOS and Prognosis: 2-3 weeos good Medical Problem List and Plan:  1. DVT Prophylaxis/Anticoagulation: Pharmaceutical: Lovenox  2. Pain Management: N/A  3. Mood: pleasant and appropriate.  4. HTN: monitor with bid checks. Continue Norvasc and benazepril. Recheck lytes in am.  5. Dizziness: Vestibular eval and treat. Monitor for worsening of symptoms with increased mobility.  6. Constipation: Will schedule miralax daily. Suppository today as indicating no BM since admission  Ivory Broad, MD

## 2011-07-24 NOTE — Progress Notes (Signed)
Occupational Therapy Treatment Patient Details Name: [name] MRN: [id number] DOB: 1924/10/02 Today's Date: 07/24/2011 Time: 3086-5784 OT Time Calculation (min): 24 min  OT Assessment / Plan / Recommendation Comments on Treatment Session Pt. continues to have rt. UE ataxia affecting ADL tasks and educated pt. on techniques for completing tasks to increase level of independence.    Follow Up Recommendations  Inpatient Rehab    Barriers to Discharge       Equipment Recommendations  Defer to next venue    Recommendations for Other Services    Frequency Min 3X/week   Plan Discharge plan remains appropriate    Precautions / Restrictions Precautions Precautions: Fall Restrictions Weight Bearing Restrictions: No   Pertinent Vitals/Pain Stable throughout and no pain    ADL  Eating/Feeding: Performed;Set up Where Assessed - Eating/Feeding: Chair Grooming: Performed;Wash/dry face;Teeth care;Set up;Supervision/safety Where Assessed - Grooming: Supported sitting      OT Goals Acute Rehab OT Goals OT Goal Formulation: With patient Time For Goal Achievement: 08/03/11 Potential to Achieve Goals: Good ADL Goals Pt Will Perform Eating: with supervision;Sitting, chair;Supported;with adaptive utensils ADL Goal: Eating - Progress: Met Pt Will Perform Grooming: with set-up;Unsupported;Sitting, edge of bed ADL Goal: Grooming - Progress: Progressing toward goals Pt Will Transfer to Toilet: with min assist;Ambulation;with DME;3-in-1;Regular height toilet;Grab bars ADL Goal: Toilet Transfer - Progress: Progressing toward goals  Visit Information  Last OT Received On: 07/24/11 Assistance Needed: +1          Cognition  Overall Cognitive Status: Impaired Area of Impairment: Attention Difficult to assess due to: Hard of hearing/deaf Arousal/Alertness: Awake/alert Orientation Level: Appears intact for tasks assessed Behavior During Session: [facility] for tasks performed Current  Attention Level: Selective    Mobility Transfers Transfers: Sit to Stand;Stand to Sit Sit to Stand: From chair/3-in-1;3: Mod assist;With upper extremity assist Details for Transfer Assistance: Mod verbal cues for hand placement and technique along with for anterior weight shift.         End of Session OT - End of Session Equipment Utilized During Treatment: Gait belt Activity Tolerance: Patient tolerated treatment well Patient left: in chair;with call bell/phone within reach;with chair alarm set Nurse Communication: Mobility status   Umair Rosiles, OTR/L Pager [phone] 07/24/2011, 12:27 PM

## 2011-07-24 NOTE — Progress Notes (Signed)
Pt transferred from 3700 to 4000. Initated orientation process, but noted that pt had difficulty hearing. Pt stated that he needed a new battery for his hearing aid, and preferred to wait for daughter to continue process. Daughter not at bedside at end of shift. Will advise oncoming nurse

## 2011-07-24 NOTE — Progress Notes (Signed)
Physical Therapy Treatment Patient Details Name: [name] MRN: [id number] DOB: 1924/07/14 Today's Date: 07/24/2011 Time: 0725-0752 PT Time Calculation (min): 27 min  PT Assessment / Plan / Recommendation Comments on Treatment Session  Decline in gait abilities today possibly related to decreased attention to task in busy hallways.    Follow Up Recommendations  Inpatient Rehab    Barriers to Discharge  Family available PRN      Equipment Recommendations  Defer to next venue    Recommendations for Other Services  None  Frequency Min 4X/week   Plan Discharge plan remains appropriate;Frequency remains appropriate    Precautions / Restrictions Precautions Precautions: Fall   Pertinent Vitals/Pain VSS/no reports of pain    Mobility  Bed Mobility Supine to Sit: 5: Supervision Sitting - Scoot to Edge of Bed: 5: Supervision Details for Bed Mobility Assistance: Decr. efficiency today with reaching edge of bed and required verbal cues for improved initiation of trunk and less upper extremity reliance. Transfers Sit to Stand: From bed;From chair/3-in-1;3: Mod assist;With upper extremity assist Stand to Sit: To chair/3-in-1;4: Min assist;With upper extremity assist Details for Transfer Assistance: Stood multiple times with best attempt with minimal assistance with patient performing at least 90% of the transition, but he requires constant cueing for anterior weight shift and leg positioning pre-initiation as stance too wide. Ambulation/Gait Ambulation/Gait Assistance: 2: Max assist Ambulation Distance (Feet): 100 Feet Assistive device: Rolling walker Ambulation/Gait Assistance Details: Up to max. assistance today with gait secondary to poor swing phase of right leg - dragging with poor foot clearance. Also with poor posture and walking too far behind walker and therefore required constant verbal and tactile cues to correct. Patient's quality of gait deteriorated in busy environment  secondary to poor attention to task and easilt distracted. Patient also required facilitation to prevent right lean. Gait Pattern: Step-through pattern;Decreased stride length;Decreased hip/knee flexion - right;Lateral trunk lean to right    Exercises Other Exercises Other Exercises: Practiced weight shift and marching with sustained stance bilaterally. Performed reciprocally for co-ordination.    PT Goals Acute Rehab PT Goals PT Goal: Sit to Stand - Progress: Progressing toward goal PT Goal: Stand to Sit - Progress: Progressing toward goal PT Goal: Stand - Progress: Progressing toward goal PT Goal: Ambulate - Progress: Progressing toward goal  Visit Information  Last PT Received On: 07/24/11 Assistance Needed: +1    Subjective Data  Subjective: Patient without complaints Patient Stated Goal: Walk   Cognition  Overall Cognitive Status: Impaired Area of Impairment: Attention Difficult to assess due to: Hard of hearing/deaf Arousal/Alertness: Awake/alert Orientation Level: Appears intact for tasks assessed Behavior During Session: [facility] for tasks performed Current Attention Level: Selective Attention - Other Comments: Selective attention until out in hallways with gait and then patient becomes very distracted which affects his quality of gait - particularly as ambulated at busy time in the hallways.    Balance  Static Standing Balance Static Standing - Balance Support: No upper extremity supported Static Standing - Level of Assistance: 4: Min assist Static Standing - Comment/# of Minutes: Worked in quasi-static standing on balance maintenance without upper extremity support. Posterior bias that requires correction from PT.  End of Session PT - End of Session Equipment Utilized During Treatment: Gait belt Activity Tolerance: Patient tolerated treatment well Patient left: in chair;with call bell/phone within reach;with chair alarm set Nurse Communication: Mobility status (Nurse  tech)    Edwyna Perfect, PT  Pager [phone]  07/24/2011, 8:03 AM

## 2011-07-24 NOTE — Progress Notes (Signed)
No new c/o No change over weekend Dizzy some better; ataxia due to CVA Echo and carotid ok Awaiting rehab Will have help  and 24 hr assistance upon d/c  CIR possible today; if not will get to SNF- rehab No change in D/  Summary on 07/21/10 VSS Exam unremakable Ok for D/c

## 2011-07-24 NOTE — Plan of Care (Signed)
Overall Plan of Care [facility]) Patient Details Name: [name] MRN: [id number] DOB: [birth year]  Diagnosis:  rehabilitationfor CVA  Primary Diagnosis:    Right cerebellar CVA Co-morbidities: right hemi-ataxia, gait disorder, hearing impairment  Functional Problem List  Patient demonstrates impairments in the following areas: Balance, Edema, Endurance, Motor and Perception  Basic ADL's: eating, grooming, bathing, dressing and toileting Advanced ADL's:   Transfers:  bed mobility, bed to chair, car and furniture Locomotion:  ambulation, wheelchair mobility and stairs  Additional Impairments:  Functional use of upper extremity  Anticipated Outcomes Item Anticipated Outcome  Eating/Swallowing    Basic self-care  supervision  Tolieting  supervision  Bowel/Bladder  Mod I  Transfers    Basic transfers with supervision; car with min assist  Locomotion  Supervision w/c mobility x 150' ; gait TBD  Communication    Cognition    Pain  <3  Safety/Judgment    Other     Therapy Plan:  PT 1-2x/day, 60-90 minutes, 5 out of 7 days  OT 1-2X/day, 60-90 minutes, 5 out of 7 days       Team Interventions: Item RN PT OT SLP SW TR Other  Self Care/Advanced ADL Retraining   x      Neuromuscular Re-Education  x x      Therapeutic Activities  x x      UE/LE Strength Training/ROM  x x      UE/LE Coordination Activities  x x      Visual/Perceptual Remediation/Compensation  x x      DME/Adaptive Equipment Instruction  x x      Therapeutic Exercise  x x      Balance/Vestibular Training  x x      Patient/Family Education  x x      Cognitive Remediation/Compensation         Functional Mobility Training  x x      Ambulation/Gait Training  x       Museum/gallery curator  x       Wheelchair Propulsion/Positioning  x       Energy manager         Bladder  Management x        Bowel Management x        Disease Management/Prevention x        Pain Management x        Medication Management         Skin Care/Wound Management         Splinting/Orthotics  x       Discharge Planning x x x      Psychosocial Support x x x                         Team Discharge Planning: Destination:  Home Projected Follow-up:  PT OT Projected Equipment Needs:  Water engineer, 3:1, and tub bench Patient/family involved in discharge planning:  Yes  MD ELOS: 21days Medical Rehab Prognosis:  Good Assessment: 78 year old male admitted for cerebellar stroke now requiring CIR level PT, OT, 24 7 rehabilitation are in and M.D.

## 2011-07-24 NOTE — Progress Notes (Addendum)
Rehab admissions - Evaluated for possible admission.  I spoke with daughter on Friday.  She is interested in inpatient rehab.  I have called and faxed information to [employer] for review.  I should be able to get authorization and admit to inpatient rehab later today.  Call me for questions.  [phone]  I have insurance approval and can admit to acute inpatient rehab today.  I have talked with Dtr and with case Production designer, theatre/television/film and Child psychotherapist.  Bed available and will admit to inpatient rehab today.  #[phone]

## 2011-07-25 DIAGNOSIS — I6330 Cerebral infarction due to thrombosis of unspecified cerebral artery: Secondary | ICD-10-CM

## 2011-07-25 DIAGNOSIS — Z5189 Encounter for other specified aftercare: Secondary | ICD-10-CM

## 2011-07-25 LAB — DIFFERENTIAL
Basophils Absolute: 0 10*3/uL (ref 0.0–0.1)
Basophils Relative: 0 % (ref 0–1)
Eosinophils Absolute: 0.2 10*3/uL (ref 0.0–0.7)
Eosinophils Relative: 2 % (ref 0–5)
Lymphocytes Relative: 11 % — ABNORMAL LOW (ref 12–46)
Lymphs Abs: 1.1 10*3/uL (ref 0.7–4.0)
Monocytes Absolute: 1.1 10*3/uL — ABNORMAL HIGH (ref 0.1–1.0)
Monocytes Relative: 11 % (ref 3–12)
Neutro Abs: 7.3 10*3/uL (ref 1.7–7.7)
Neutrophils Relative %: 76 % (ref 43–77)

## 2011-07-25 LAB — COMPREHENSIVE METABOLIC PANEL
ALT: 14 U/L (ref 0–53)
AST: 20 U/L (ref 0–37)
Albumin: 2.8 g/dL — ABNORMAL LOW (ref 3.5–5.2)
Alkaline Phosphatase: 49 U/L (ref 39–117)
BUN: 24 mg/dL — ABNORMAL HIGH (ref 6–23)
CO2: 30 mEq/L (ref 19–32)
Calcium: 10.6 mg/dL — ABNORMAL HIGH (ref 8.4–10.5)
Chloride: 104 mEq/L (ref 96–112)
Creatinine, Ser: 1.11 mg/dL (ref 0.50–1.35)
GFR calc Af Amer: 68 mL/min — ABNORMAL LOW (ref >90)
GFR calc non Af Amer: 59 mL/min — ABNORMAL LOW (ref >90)
Glucose, Bld: 143 mg/dL — ABNORMAL HIGH (ref 70–99)
Potassium: 4.1 mEq/L (ref 3.5–5.1)
Sodium: 143 mEq/L (ref 135–145)
Total Bilirubin: 0.3 mg/dL (ref 0.3–1.2)
Total Protein: 6.1 g/dL (ref 6.0–8.3)

## 2011-07-25 LAB — CBC
HCT: 35.1 % — ABNORMAL LOW (ref 39.0–52.0)
Hemoglobin: 12 g/dL — ABNORMAL LOW (ref 13.0–17.0)
MCH: 31.8 pg (ref 26.0–34.0)
MCHC: 34.2 g/dL (ref 30.0–36.0)
MCV: 93.1 fL (ref 78.0–100.0)
Platelets: 109 10*3/uL — ABNORMAL LOW (ref 150–400)
RBC: 3.77 MIL/uL — ABNORMAL LOW (ref 4.22–5.81)
RDW: 13 % (ref 11.5–15.5)
WBC: 9.6 10*3/uL (ref 4.0–10.5)

## 2011-07-25 LAB — CLOSTRIDIUM DIFFICILE BY PCR: Toxigenic C. Difficile by PCR: POSITIVE — AB

## 2011-07-25 MED ORDER — METRONIDAZOLE 500 MG PO TABS
500.0000 mg | ORAL_TABLET | Freq: Three times a day (TID) | ORAL | Status: AC
  Administered 2011-07-25 – 2011-08-01 (×21): 500 mg via ORAL
  Filled 2011-07-25 (×24): qty 1

## 2011-07-25 NOTE — Progress Notes (Signed)
Inpatient Rehabilitation Center Individual Statement of Services  Patient Name:  [name]  Date:  07/25/2011  Welcome to the Inpatient Rehabilitation Center.  Our goal is to provide you with an individualized program based on your diagnosis and situation, designed to meet your specific needs.  With this comprehensive rehabilitation program, you will be expected to participate in at least 3 hours of rehabilitation therapies Monday-Friday, with modified therapy programming on the weekends.  Your rehabilitation program will include the following services:  Physical Therapy (PT), Occupational Therapy (OT), Speech Therapy (ST), 24 hour per day rehabilitation nursing, Therapeutic Recreaction (TR), Case Management (RN and Child psychotherapist), Rehabilitation Medicine, Nutrition Services and Pharmacy Services  Weekly team conferences will be held on Wednesdays to discuss your progress.  Your RN Case Designer, television/film set will talk with you frequently to get your input and to update you on team discussions.  Team conferences with you and your family in attendance may also be held.  Expected length of stay: 17 - 21 days   Overall anticipated outcome: supervision  Depending on your progress and recovery, your program may change.  Your RN Case Estate agent will coordinate services and will keep you informed of any changes.  Your RN Sports coach and SW names and contact numbers are listed  below.  The following services may also be recommended but are not provided by the Inpatient Rehabilitation Center:   Driving Evaluations  Home Health Rehabiltiation Services  Outpatient Rehabilitatation [facility]  Vocational Rehabilitation   Arrangements will be made to provide these services after discharge if needed.  Arrangements include referral to agencies that provide these services.  Your insurance has been verified to be:  [facility] Medicare / [employer] Your primary doctor is:  Dr Quitman Livings  Pertinent information will be shared with your doctor and your insurance company.  Case Manager: Lutricia Horsfall, [facility] [phone]  Social Worker:  Dossie Der, [place] [phone]  Information discussed with and copy given to patient by: Meryl Dare, 07/25/2011, 10:56 AM

## 2011-07-25 NOTE — Progress Notes (Signed)
Patient ID: [name], male   DOB: [birth year], 78 y.o.   MRN: [id number] Subjective/Complaints:  Review of Systems  Neurological: Positive for dizziness and tremors.  All other systems reviewed and are negative.    Objective: Vital Signs: Blood pressure 114/62, pulse 79, temperature 98.4 F (36.9 C), temperature source Oral, resp. rate 20, height 5\' 7"  (1.702 m), weight 78.6 kg (173 lb 4.5 oz), SpO2 98.00%. No results found. Results for orders placed during the hospital encounter of 07/19/11 (from the past 72 hour(s))  URINALYSIS, ROUTINE W REFLEX MICROSCOPIC     Status: Abnormal   Collection Time   07/22/11  5:42 PM      Component Value Range Comment   Color, Urine YELLOW  YELLOW     APPearance CLOUDY (*) CLEAR     Specific Gravity, Urine 1.020  1.005 - 1.030     pH 6.0  5.0 - 8.0     Glucose, UA NEGATIVE  NEGATIVE (mg/dL)    Hgb urine dipstick SMALL (*) NEGATIVE     Bilirubin Urine NEGATIVE  NEGATIVE     Ketones, ur NEGATIVE  NEGATIVE (mg/dL)    Protein, ur NEGATIVE  NEGATIVE (mg/dL)    Urobilinogen, UA 0.2  0.0 - 1.0 (mg/dL)    Nitrite NEGATIVE  NEGATIVE     Leukocytes, UA MODERATE (*) NEGATIVE    URINE CULTURE     Status: Normal   Collection Time   07/22/11  5:42 PM      Component Value Range Comment   Specimen Description URINE, CLEAN CATCH      Special Requests NONE      Culture  Setup Time [id number]      Colony Count 95,000 COLONIES/ML      Culture        Value: Multiple bacterial morphotypes present, none predominant. Suggest appropriate recollection if clinically indicated.   Report Status 07/24/2011 FINAL     URINE MICROSCOPIC-ADD ON     Status: Abnormal   Collection Time   07/22/11  5:42 PM      Component Value Range Comment   Squamous Epithelial / LPF RARE  RARE     WBC, UA 3-6  <3 (WBC/hpf)    RBC / HPF 0-2  <3 (RBC/hpf)    Bacteria, UA RARE  RARE     Casts HYALINE CASTS (*) NEGATIVE     Urine-Other MUCOUS PRESENT        HEENT: normal Cardio:  RRR Resp: CTA B/L GI: BS positive Extremity:  Pulses positive and No Edema Skin:   Intact Neuro: Alert/Oriented, Cranial Nerve II-XII normal, Normal Sensory, Normal Motor and Other ataxia on finger to nose as well as heel to shin   Assessment/Plan: 1. Functional deficits secondary to R superior and lateral cerebellar infarct which require 3+ hours per day of interdisciplinary therapy in a comprehensive inpatient rehab setting. Physiatrist is providing close team supervision and 24 hour management of active medical problems listed below. Physiatrist and rehab team continue to assess barriers to discharge/monitor patient progress toward functional and medical goals. FIM:                   Comprehension Comprehension Mode: Auditory Comprehension: 5-Understands basic 90% of the time/requires cueing < 10% of the time [facility], has left hearing aids)  Expression Expression Mode: Verbal Expression: 4-Expresses basic 75 - 89% of the time/requires cueing 10 - 24% of the time. Needs helper to occlude trach/needs to repeat words.  Social Interaction  Social Interaction: 4-Interacts appropriately 75 - 89% of the time - Needs redirection for appropriate language or to initiate interaction.  Problem Solving Problem Solving: 4-Solves basic 75 - 89% of the time/requires cueing 10 - 24% of the time  Memory Memory: 4-Recognizes or recalls 75 - 89% of the time/requires cueing 10 - 24% of the time  Medical Problem List and Plan:  1. DVT Prophylaxis/Anticoagulation: Pharmaceutical: Lovenox  2. Pain Management: N/A  3. Mood: pleasant and appropriate.  4. HTN: monitor with bid checks. Continue Norvasc and benazepril. Recheck lytes in am.  5. Dizziness: Vestibular eval and treat. Monitor for worsening of symptoms with increased mobility.  6. Constipation: Will schedule miralax daily. Suppository today as indicating no BM since admission      LOS (Days) 1 A FACE TO FACE EVALUATION WAS  PERFORMED  Arelis Neumeier E 07/25/2011, 6:31 AM

## 2011-07-25 NOTE — Evaluation (Addendum)
Occupational Therapy Assessment and Plan  Patient Details  Name: [name] MRN: [id number] Date of Birth: [birth year]  OT Diagnosis: abnormal posture, ataxia, cognitive deficits, hemiplegia affecting dominant side and muscle weakness (generalized) Rehab Potential: Rehab Potential: Excellent ELOS:  17 days  Today's Date: 07/25/2011 Time: 0906-1001 Time Calculation (min): 55 min  Problem List:  Patient Active Problem List  Diagnoses  . HTN (hypertension)  . CVA (cerebral infarction)  . Hearing loss  . Meningioma    Past Medical History:  Past Medical History  Diagnosis Date  . Hypertension   . Difficulty hearing, right    Past Surgical History: No past surgical history on file.  Assessment & Plan Clinical Impression: Patient is a 78 y.o. year old male with recent admission to the hospital on  with 05/08 with Lethargy, slurred speech and difficulty walking. 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 MRA brain revealead proximal occlusion R-SCA c/w acute infarct.  Patient transferred to CIR on 07/24/2011 .    Patient currently requires max with basic self-care skills secondary to muscle weakness and impaired timing and sequencing, abnormal tone, unbalanced muscle activation, ataxia, decreased coordination and decreased motor planning.  Prior to hospitalization, patient could complete ADLs with independence.  Patient will benefit from skilled intervention to decrease level of assist with basic self-care skills and increase independence with basic self-care skills prior to discharge home with daughter and grandchhildren.  Anticipate patient will require 24 hour supervision and follow up home health.  OT - End of Session Activity Tolerance: Tolerates 10 - 20 min activity with multiple rests Endurance Deficit: Yes OT Assessment Rehab Potential: Excellent Barriers to Discharge:  None OT Plan OT Frequency: 1-2 X/day, 60-90 minutes OT Treatment/Interventions: Balance/vestibular training;DME/adaptive equipment instruction;Cognitive remediation/compensation;Community reintegration;Discharge planning;Neuromuscular re-education;Functional mobility training;Self Care/advanced ADL retraining;Therapeutic Activities;Visual/perceptual remediation/compensation;UE/LE Coordination activities;UE/LE Strength taining/ROM;Patient/family education;Therapeutic Exercise OT Recommendation Follow Up Recommendations: Home health OT Equipment Recommended: 3 in 1 bedside comode;Tub/shower bench  OT Evaluation Precautions/Restrictions  Precautions Precautions: Fall Restrictions Weight Bearing Restrictions: No  Vital Signs Therapy Vitals Temp: 98.1 F (36.7 C) Temp src: Oral Pulse Rate: 76  Resp: 20  BP: 124/70 mmHg Patient Position, if appropriate: Sitting Oxygen Therapy SpO2: 100 % O2 Device: None (Room air) Pain Pain Assessment Pain Assessment: No/denies pain Home Living/Prior Functioning Home Living Lives With: Alone Available Help at Discharge: Family;Available PRN/intermittently Type of Home: House Home Access: Stairs to enter [employer] of Steps: 5 with 1 rail at back; 2 without rail at front Home Layout: One level Prior Function Able to Take Stairs?: Yes Driving: Yes Vocation: Retired ADL ADL Eating: Set up Where Assessed-Eating: Wheelchair Grooming: Supervision/safety;Moderate cueing Where Assessed-Grooming: Wheelchair Upper Body Bathing: Supervision/safety;Minimal cueing Where Assessed-Upper Body Bathing: Sitting at sink;Wheelchair Lower Body Bathing: Moderate assistance Where Assessed-Lower Body Bathing: Standing at sink;Sitting at sink;Wheelchair Upper Body Dressing: Setup Where Assessed-Upper Body Dressing: Wheelchair;Sitting at sink Lower Body Dressing: Maximal assistance Where Assessed-Lower Body Dressing: Wheelchair;Sitting at  sink;Standing at sink Toileting: Dependent Where Assessed-Toileting: Bedside Commode Toilet Transfer: Maximal assistance Toilet Transfer Method: Stand pivot Toilet Transfer Equipment: Animator Transfer: Not assessed Film/video editor: Not assessed ADL Comments: Pt overall needs min to [employer] cues to sequence bathing task.  Mod assist for sit to stand from wheelchair, with max instructional cues for foot and hand placement.  Pt with feet way out in front of him for sit to stand and with  wide base of support.  LOB to the right and posteriorly in standing when attempting to perform perianal washing.   Vision/Perception  Vision - History Baseline Vision: Wears glasses all the time Visual History: Cataracts Patient Visual Report: Blurring of vision Vision - Assessment Eye Alignment: Impaired (comment) (right eye sits superior to left.) Vision Assessment: Vision tested Ocular Range of Motion: Within Functional Limits Tracking/Visual Pursuits: Decreased smoothness of vertical tracking;Decreased smoothness of horizontal tracking Visual Fields: No apparent deficits Additional Comments: Pt with decreased smooth tracking in all quadrants.  Pt unable to read large newspaper print when asked.  Question if it's abiltity to read vs visual acuity.  Will further assess in treatment session. Perception Perception: Within Functional Limits Praxis Praxis: Intact  Cognition Overall Cognitive Status: Impaired Arousal/Alertness: Awake/alert Orientation Level: Oriented X4 Attention: Sustained Focused Attention: Appears intact Sustained Attention: Appears intact Memory: Appears intact Awareness: Impaired Awareness Impairment: Intellectual impairment Problem Solving: Impaired Problem Solving Impairment: Verbal complex;Functional complex  Sensation Sensation Light Touch: Appears Intact (in bilateral UEs) Stereognosis: Appears Intact Hot/Cold: Not  tested Proprioception: Appears Intact (in bilateral UEs) Coordination Gross Motor Movements are Fluid and Coordinated: No Fine Motor Movements are Fluid and Coordinated: No Coordination and Movement Description: Pt with decreased FM and grossmotor coordination in the right hand noted when attempting to remove and replace toothpaste cap as well as with finger to nose testing.   Motor  Motor Motor: Hemiplegia;Abnormal postural alignment and control;Ataxia Motor - Skilled Clinical Observations: Pt with slight ataxia in the right UE during functional use. Mobility  Transfers Sit to Stand: 3: Mod assist;With upper extremity assist;With armrests;From toilet Sit to Stand Details: Manual facilitation for weight shifting;Verbal cues for sequencing  Trunk/Postural Assessment  Cervical Assessment Cervical Assessment: Within Functional Limits Postural Control Postural Control: Deficits on evaluation Righting Reactions: Pt with increased forward trunk flexion in standing and increased lean to the right.  Unable to stand completely upright with hip and lumbar extension during OT B/D session.  Balance Static Sitting Balance Static Sitting - Balance Support: No upper extremity supported Static Sitting - Level of Assistance: 5: Stand by assistance Dynamic Sitting Balance Dynamic Sitting - Balance Support: No upper extremity supported Dynamic Sitting - Level of Assistance: 4: Min assist (LOB posteriorly when crossing LEs to dress.) Static Standing Balance Static Standing - Balance Support: Left upper extremity supported;Right upper extremity supported Static Standing - Level of Assistance: 3: Mod assist Dynamic Standing Balance Dynamic Standing - Balance Support: Right upper extremity supported;Left upper extremity supported Dynamic Standing - Level of Assistance: 2: Max assist Dynamic Standing - Comments: Pt with increased lean to the right and forward in standing.  Decreased ability to move RLE  during stand pivot transfer to the left side. Extremity/Trunk Assessment RUE Assessment RUE Assessment: Exceptions to [facility] RUE AROM (degrees) RUE Overall AROM Comments: shoulder strength 3+/5, elbow strength 4/5, grip 4/5   decreased FM and gross motor coordination but able to use functionally at a diminshed level for selfcare tasks. LUE Assessment LUE Assessment: Exceptions to [facility] LUE AROM (degrees) LUE Overall AROM Comments: AROM and strength WFLS throughout.  Pt with decreased FM manipulation when opening and closing toothpaste.  See FIM for current functional status Refer to Care Plan for Long Term Goals  Recommendations for other services: None  Discharge Criteria: Patient will be discharged from OT if patient refuses treatment 3 consecutive times without medical reason, if treatment goals not met, if there is a change in medical status, if patient  makes no progress towards goals or if patient is discharged from hospital.  The above assessment, treatment plan, treatment alternatives and goals were discussed and mutually agreed upon: by patient  Began OT education and treatment during session.  Focus on balance in standing to manage clothing for toileting as well as performing bathing and dressing at the sink level. Pt needs mod facilitation to perform sit to stand with mod to max instructional cueing for foot placement and technique to stand.  Frequent lean to the right in standing.  Also exhibits decreased ability to reach his feet for donning socks and shoes during session.  Pt dysarthric at times and difficult to understand.    Donny Heffern 07/25/2011, 4:21 PM

## 2011-07-25 NOTE — Evaluation (Addendum)
Speech Language Pathology Assessment and Plan  Patient Details  Name: [name] MRN: [id number] Date of Birth: 1924-08-06  SLP Diagnosis: cognitive-linguistic deficits, dysarthria Rehab Potential: Good ELOS: ? 2 weeks   Today's Date: 07/25/2011 Time: 1100-1200 Time Calculation (min): 60 min  Problem List:  Patient Active Problem List  Diagnoses  . HTN (hypertension)  . CVA (cerebral infarction)  . Hearing loss  . Meningioma    Past Medical History:  Past Medical History  Diagnosis Date  . Hypertension   . Difficulty hearing, right    Past Surgical History: No past surgical history on file.  Assessment & Plan Clinical Impression: Patient is a 78 y.o. year old male with recent admission to the hospital on 05/08 with Lethargy, slurred speech and difficulty walking. 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 MRA brain revealead proximal occlusion R-SCA c/w acute infarct.  Patient transferred to CIR on 07/24/2011 and presents with deficits impacting speech intelligibility, cognition including awareness and problem solving, and an atypical language pattern seen primarily during reading task and conversation characterized by inappropriate addition of words/phrases out of context of topic, circumlocution, and occasional perseverasations.  ? Baseline speech and language function as combination of symptoms raises suspicion for cluttering; confused, disorganized language, limited awareness of errors, mispronunciation of speech sounds, auditory perceptual deficits, etc. . Attempted to contact daughter who was unavailable. Will continue efforts. Patient will benefit from skilled SLP services to address above deficits.   SLP - End of Session Patient left: in bed Assessment SLP Recommendation/Assessment: Patient will need skilled Speech Lanaguage Pathology Services during CIR admission Rehab  Potential: Good Therapy Diagnosis: Dysarthria;Cognitive Impairments;Speech and Language deficits Type of Dysarthria: Ataxic SLP Plan SLP Frequency: 5 out of 7 days Estimated Length of Stay: ? 2 weeks SLP Treatment/Interventions: Cognitive remediation/compensation;Cueing hierarchy;Functional tasks;Internal/external aids;Patient/family education;Speech/Language facilitation;Therapeutic Activities Recommendation Follow up Recommendations:  (TBD)  SLP Evaluation Precautions/Restrictions    General    Pain Pain Assessment Pain Assessment: No/denies pain Prior Functioning Cognitive/Linguistic Baseline: Information not available (attempted to call daughter, unavailable) Type of Home: House Lives With: Alone Cognition Overall Cognitive Status: Impaired Arousal/Alertness: Awake/alert Orientation Level: Oriented X4 Attention: Focused;Sustained Focused Attention: Appears intact Sustained Attention: Appears intact Memory: Appears intact (for basic daily information, high level TBD) Awareness: Impaired Awareness Impairment: Intellectual impairment Problem Solving: Impaired Problem Solving Impairment: Verbal complex;Functional complex Executive Function: Self Monitoring Self Monitoring: Impaired Self Monitoring Impairment: Verbal complex Behaviors: Perseveration Safety/Judgment: Appears intact Comprehension Auditory Comprehension Overall Auditory Comprehension: Impaired Yes/No Questions: Within Functional Limits Commands: Impaired Two Step Basic Commands: 25-49% accurate Conversation: Simple Interfering Components: Hearing EffectiveTechniques: Extra processing time;Repetition Visual Recognition/Discrimination Discrimination: Within Function Limits Reading Comprehension Reading Status: Impaired Word level: Impaired Sentence Level: Impaired Paragraph Level: Impaired Interfering Components:  (? origin) Effective Techniques: Verbal cueing Expression Expression Primary Mode of  Expression: Verbal Verbal Expression Overall Verbal Expression: Impaired Initiation: No impairment Automatic Speech: Name;Social Response Level of Generative/Spontaneous Verbalization: Conversation Repetition: No impairment Naming: Impairment Responsive: 76-100% accurate Confrontation: Impaired Convergent: 75-100% accurate Verbal Errors: Not aware of errors;Other (comment) (intermittent inappropriate interjection of words/phrases) Pragmatics: No impairment Written Expression Written Expression: Not tested Oral/Motor Oral Motor/Sensory Function Overall Oral Motor/Sensory Function: Impaired Labial ROM: Within Functional Limits Labial Symmetry: Within Functional Limits Labial Strength: Reduced Labial Sensation: Within Functional Limits Lingual ROM: Within Functional Limits Lingual Symmetry: Within Functional Limits Lingual Strength: Reduced Lingual Sensation: Within Functional Limits Facial ROM: Within  Functional Limits Facial Symmetry: Within Functional Limits Facial Strength: Within Functional Limits Facial Sensation: Within Functional Limits Velum: Within Functional Limits Mandible: Within Functional Limits Motor Speech Overall Motor Speech: Impaired Respiration: Within functional limits Phonation: Normal Resonance: Within functional limits Articulation: Impaired Level of Impairment: Word Intelligibility: Intelligibility reduced Word: 50-74% accurate Phrase: 50-74% accurate Sentence: 50-74% accurate Conversation: 50-74% accurate Motor Planning: Witnin functional limits Motor Speech Errors: Not applicable Effective Techniques: Over-articulate  See FIM for current functional status Refer to Care Plan for Long Term Goals  Recommendations for other services: None  Discharge Criteria: Patient will be discharged from SLP if patient refuses treatment 3 consecutive times without medical reason, if treatment goals not met, if there is a change in medical status, if patient  makes no progress towards goals or if patient is discharged from hospital.  The above assessment, treatment plan, treatment alternatives and goals were discussed and mutually agreed upon: by patient  Ferdinand Lango Meryl 07/25/2011, 2:09 PM  .

## 2011-07-25 NOTE — Progress Notes (Signed)
Social Work Assessment and Plan Social Work Assessment and Plan  Patient Details  Name: [name] MRN: [id number] Date of Birth: 1924/12/28  Today's Date: 07/25/2011  Problem List:  Patient Active Problem List  Diagnoses  . HTN (hypertension)  . CVA (cerebral infarction)  . Hearing loss  . Meningioma   Past Medical History:  Past Medical History  Diagnosis Date  . Hypertension   . Difficulty hearing, right    Past Surgical History: No past surgical history on file. Social History:  reports that he has never smoked. He does not have any smokeless tobacco history on file. He reports that he does not drink alcohol or use illicit drugs.  Family / Support Systems Marital Status: Widow/Widower Patient Roles: Parent Children: [name] Rembert-daughter  [phone]  [phone]  [phone] Other Supports: Grandson's Anticipated Caregiver: Daughter and grandson's Ability/Limitations of Caregiver: Daughter works days still formulating a Higher education careers adviser for [employer] pt will need 24 hour care Caregiver Availability: 24/7 Family Dynamics: Close family who help one another.  Pt has been very independent up to this point.  He feels now he will give up driving and slow down some.  Social History Preferred language: English Religion: Catholic Cultural Background: No issues Education: High School Read: Yes Write: Yes Employment Status: Retired Fish farm manager Issues: No issues Guardian/Conservator: None   Abuse/Neglect Physical Abuse: Denies Verbal Abuse: Denies Sexual Abuse: Denies Exploitation of patient/patient's resources: Denies Self-Neglect: Denies  Emotional Status Pt's affect, behavior adn adjustment status: Pt is motivated to improve and get were he can do for himself.  He has always been independent and wants to remain so.  He is one who helps others. Recent Psychosocial Issues: Other medical issues-HOH at times hinders him Pyschiatric History: No  history-deferred Depression Screen due to pt felt not necessary.  Will continue to monitor and evaluate his continued coping while here. Substance Abuse History: No issues  Patient / Family Perceptions, Expectations & Goals Pt/Family understanding of illness & functional limitations: Pt is able to explain his stroke and deficits.  He is encouraged by the progress he has made thus far and is hopeful he will make more.  He has a positive attitude and  is willing to do what he needs to do here. Premorbid pt/family roles/activities: Father, grandfather, retiree, Home owner, Mining engineer, etc Anticipated changes in roles/activities/participation: Resume at discharge Pt/family expectations/goals: Pt states: " I want to do for myself, like I've been doing."  daughter reports: " We are all hopeful, he will do well here."  [employer]: None Premorbid Home Care/DME Agencies: None Transportation available at discharge: [employer] referrals recommended: Support group (specify) ([facility] Support Group)  Discharge Planning Living Arrangements: Alone Support Systems: Children;Other relatives;Friends/neighbors;Church/faith community Type of Residence: Private residence Insurance Resources: Media planner (specify) Immunologist) Financial Resources: Social Security Financial Screen Referred: No Living Expenses: Own Money Management: Patient;Family Do you have any problems obtaining your medications?: No Home Management: Pt but daughter helps Patient/Family Preliminary Plans: Go to daughter's home with grandson's assisting at discharge.  Daughter working on a discharge plan.   Social Work Anticipated Follow Up Needs: HH/OP;Support Group  Clinical Impression Pleasant HOH gentleman who is motivated and willing to work to achieve his goals.  Supportive family who is working on a discharge plan for him.  Lucy Chris 07/25/2011, 2:55 PM

## 2011-07-25 NOTE — Evaluation (Signed)
Recreational Therapy Assessment and Plan  Patient Details  Name: [name] MRN: [id number] Date of Birth: [birth year] Today's Date: 07/25/2011  Rehab Potential: Good ELOS: 2-3 weeks  Assessment Clinical Impression: [name] is an 78 y.o. Right handed male with history of HTN, HOH, admitted 05/08 with Lethargy, slurred speech and difficulty walking. 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 MRA brain revealead proximal occlusion R-SCA c/w acute infarct. Patient started on ASA for CVA prophylaxis. Therapies ongoing and patient noted to have right lean with easily distractibility and poor attention to tasks. Dizziness improving.  Patient transferred to CIR on 07/24/2011 .   Met with pt and discussed leisure/community pursuits.  Pt stated no interest in TR services at this time, repeatedly stated he needs to get his head straight and go home.  No formal TR treatment plan implemented at this time.  Will continue to monitor through team for future participation and community reintegration as appropriate.  The above assessment, treatment plan, treatment alternatives and goals were discussed and mutually agreed upon: by patient  Janaiah Vetrano 07/25/2011, 4:10 PM

## 2011-07-25 NOTE — Evaluation (Signed)
Physical Therapy Assessment and Plan and Session Note  Patient Details  Name: [name] MRN: [id number] Date of Birth: Nov 13, 1924  PT Diagnosis: Abnormal posture, Abnormality of gait, Coordination disorder, Difficulty walking, Dizziness and giddiness, Edema, Hemiparesis dominant and Muscle weakness Rehab Potential: Good ELOS: 17-21 days   Today's Date: 07/25/2011 Time: 0805-0900 Time Calculation (min): 55 min  Problem List:  Patient Active Problem List  Diagnoses  . HTN (hypertension)  . CVA (cerebral infarction)  . Hearing loss  . Meningioma    Past Medical History:  Past Medical History  Diagnosis Date  . Hypertension   . Difficulty hearing, right    Past Surgical History: No past surgical history on file.  Assessment & Plan Clinical Impression:  [name] is an 78 y.o. Right handed male with history of HTN, HOH, admitted 05/08 with Lethargy, slurred speech and difficulty walking. 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 MRA brain revealead proximal occlusion R-SCA c/w acute infarct. Patient started on ASA for CVA prophylaxis. Therapies ongoing and patient noted to have right lean with easily distractibility and poor attention to tasks. Dizziness improving.   Patient transferred to CIR on 07/24/2011 .   Patient currently requires total assist with mobility secondary to muscle weakness, impaired timing and sequencing, unbalanced muscle activation, decreased coordination and decreased motor planning and decreased midline orientation and decreased motor planning.  Prior to hospitalization, patient was independent, including driving and cooking. He lived Alone in a single level house.  Home access is 5 with 1 rail at back; 2 without rail at frontStairs to enter.  Pt plans to d/c to his daughter Dupy's home, which is 1 level, access unknown at eval.  His step-daughter Carney Bern  can provide supervision at d/c.    Patient will benefit from skilled PT intervention to maximize safe functional mobility, minimize fall risk and decrease caregiver burden for planned discharge home with 24 hour supervision.  Anticipate patient will benefit from follow up HH at discharge.  PT - End of Session Activity Tolerance: Tolerates 10 - 20 min activity with multiple rests Endurance Deficit: Yes PT Assessment Rehab Potential: Good PT Plan PT Frequency: 1-2 X/day, 60-90 minutes Estimated Length of Stay: 17-21 days PT Treatment/Interventions: Ambulation/gait training;Balance/vestibular training;Discharge planning;DME/adaptive equipment instruction;Functional mobility training;Neuromuscular re-education;Patient/family education;Splinting/orthotics;Stair training;Psychosocial support;Therapeutic Activities;Therapeutic Exercise;UE/LE Coordination activities;UE/LE Strength taining/ROM;Visual/perceptual remediation/compensation;Wheelchair propulsion/positioning PT Recommendation Follow Up Recommendations: Home health PT Equipment Recommended: Wheelchair (measurements);Wheelchair cushion (measurements);Rolling walker with 5" wheels  PT Evaluation Precautions/Restrictions Precautions Precautions: Fall Precaution Comments: leans left and posterior in sitting and pushes R in standing   Pain Pain Assessment Pain Assessment: No/denies pain Home Living/Prior Functioning Home Living Lives With: Alone; plans to d/c to daughter's home Available Help at Discharge: Family;Available PRN/intermittently  Pt's Type of Home: House Home Access: Stairs to enter [employer] of Steps: 5 with 1 rail at back; 2 without rail at front Home Layout: One level Prior Function Able to Take Stairs?: Yes Driving: Yes Vocation: Retired    IT consultant Overall Cognitive Status: Appears within functional limits for tasks assessed (HOH interferes with comprehension) Orientation Level: Oriented X4  (stated year was 2003) Attention: Sustained Sensation Sensation Light Touch: Appears Intact Proprioception: Not tested (due to hearing loss) Coordination Gross Motor Movements are Fluid and Coordinated: No Heel Shin Test: poor contact heel on shin Motor  Motor Motor: Hemiplegia  Mobility Bed Mobility Bed Mobility: Rolling Right;Rolling Left;Sit to Supine;Supine to  Sit;Right Sidelying to Sit Rolling Right: 5: Supervision Rolling Left: 5: Supervision Rolling Left Details: Other (comment) (abnormal motor pattern) Right Sidelying to Sit: 5: Supervision Sitting - Scoot to Edge of Bed: 5: Supervision Sit to Supine: 4: Min assist Transfers Sit to Stand: 4: Min assist;With upper extremity assist Stand to Sit: 2: Max assist;With upper extremity assist Stand to Sit Details: leaned L Locomotion  Ambulation Ambulation: Yes Ambulation/Gait Assistance: 1: +2 Total assist Ambulation Distance (Feet): 30 Feet Assistive device: 2 person significant hand held assist Ambulation/Gait Assistance Details: Manual facilitation for weight shifting;Verbal cues for technique Gait Gait Pattern: Lateral trunk lean to right;Shuffle;Decreased dorsiflexion - right;Decreased weight shift to left;Decreased hip/knee flexion - right;Step-to pattern Wheelchair Mobility Wheelchair Mobility: Yes Wheelchair Assistance: 4: Min Education officer, museum: Both upper extremities Wheelchair Parts Management: Needs assistance Distance: 60  Trunk/Postural Assessment  Postural Control Postural Control: Deficits on evaluation Righting Reactions: leans left and posterior in sitting, leans R in standing and walking  Balance Static Sitting Balance Static Sitting - Balance Support: No upper extremity supported Static Sitting - Level of Assistance: 5: Stand by assistance Static Sitting - Comment/# of Minutes: L and postueror lean Static Standing Balance Static Standing - Level of Assistance: 3: Mod assist Extremity  Assessment      RLE Assessment RLE Assessment: Exceptions to [facility] (hip grossly 4-/5; knee 4+/5, ankle 4/5);  Mild edema noted LLE Assessment LLE Assessment: Within Functional Limits; mild edema noted  See FIM for current functional status Refer to Care Plan for Long Term Goals  Recommendations for other services: None  Discharge Criteria: Patient will be discharged from PT if patient refuses treatment 3 consecutive times without medical reason, if treatment goals not met, if there is a change in medical status, if patient makes no progress towards goals or if patient is discharged from hospital.  The above assessment, treatment plan, treatment alternatives and goals were discussed and mutually agreed upon: by patient  Treatment today: bed mobility for neuro re-ed, facilitating typical movement patterns for including RUE and LE in rolling, sit>< supine.   Catha Ontko 07/25/2011, 11:33 AM

## 2011-07-25 NOTE — Progress Notes (Signed)
Patient information reviewed and entered into [facility] system by Yalexa Blust, RN, CRRN, PPS Coordinator.  Information including medical coding and functional independence measure will be reviewed and updated through discharge.     Per nursing patient was given "Data Collection Information Summary for Patients in Inpatient Rehabilitation Facilities with attached "Privacy Act Statement-Health Care Records" upon admission.   

## 2011-07-25 NOTE — Progress Notes (Signed)
Occupational Therapy Session Note  Patient Details  Name: [name] MRN: [id number] Date of Birth: [birth year]  Today's Date: 07/25/2011 Time: 8469-6295 Time Calculation (min): 28 min  Short Term Goals: Week 1:  OT Short Term Goal 1 (Week 1): Pt will perfrom LB bathing 2/3 sessions with min assist sit to stand. OT Short Term Goal 2 (Week 1): Pt will perfrom LB dressing with min assist sit to stand 2/3 sessions. OT Short Term Goal 3 (Week 1): Pt will transfer stand pivot to and from the 3:1 with min assist. OT Short Term Goal 4 (Week 1): Pt will perform 2 grooming tasks in standing with min no more than min assist for balance.  Skilled Therapeutic Interventions/Progress Updates:  Pt seen for OT treatment focus on further evaluating vision, BUE AROM and strength,and sitting and standing balance.  Pt with decreased tracking ability and history of cataracts.  Unable to read near or far print but unsure if it is cognition or vision.  He was able to state the correct time when asked.  Noted decreased smooth eye movements when following object as well.  Mod assist for stand pivot transfer to EOB on the right side and max assist to pivot back to the wheelchair on the left side.  Noted LOB posteriorly in sitting when attempting to cross LEs to simulate LB dressing.     Therapy Documentation Precautions:  Precautions Precautions: Fall Precaution Comments: leans L and posterior in sitting; leans significantly R in standing Restrictions Weight Bearing Restrictions: No  Pain: Pain Assessment Pain Assessment: No/denies pain ADL: See FIM for current functional status  Therapy/Group: Individual Therapy  Berry Godsey 07/25/2011, 4:41 PM

## 2011-07-26 NOTE — Progress Notes (Signed)
Speech Language Pathology Daily Session Note  Patient Details  Name: [name] MRN: [id number] Date of Birth: [birth year]  Today's Date: 07/26/2011 Time: 1515-1600 Time Calculation (min): 45 min  Short Term Goals: Week 1: SLP Short Term Goal 1 (Week 1): Patient will verbalize mildly complex needs/wants at the conversation level with mod verbal cues for self correction of verbal errors SLP Short Term Goal 2 (Week 1): Patient will utilize speech intelligibility strategies with min assist at the conversation level to improve intelligibility to 75%.  SLP Short Term Goal 3 (Week 1): Patient will demonstrate intact problem solving for moderately complex ADLs with moderate assist.  SLP Short Term Goal 4 (Week 1): Patient will demonstrate intellectual awareness of cognitive-linguistic and physical deficits during functional ADLs with mod assist.   Skilled Therapeutic Interventions: Session focused on speech-language treatment with SLP educating patient on speech intelligibility strategies (talk slow, loud) with 4-5 reps required before patient could verbally return information.  Speech sounds clear at times and very unintelligible at others.  SLP requested information on hearing status and patient reported that his hearing aid battery was dying, as a result SLP cued him to call his daughter to request a new battery.  Patient required max assist to recall daughter's number but was Mod I to recognized that with written aid he was unable to dial number (suspect significant vision deficits) and requested help from SLP.  Patient also Mod I to return use of call bell.    Daily Session Precautions/Restrictions    FIM:  Comprehension Comprehension Mode: Auditory Comprehension: 4-Understands basic 75 - 89% of the time/requires cueing 10 - 24% of the time Expression Expression Mode: Verbal Expression: 4-Expresses basic 75 - 89% of the time/requires cueing 10 - 24% of the time. Needs helper to occlude  trach/needs to repeat words. Social Interaction Social Interaction: 4-Interacts appropriately 75 - 89% of the time - Needs redirection for appropriate language or to initiate interaction. Problem Solving Problem Solving: 4-Solves basic 75 - 89% of the time/requires cueing 10 - 24% of the time Memory Memory: 3-Recognizes or recalls 50 - 74% of the time/requires cueing 25 - 49% of the time FIM - Eating Eating Activity: 5: Set-up assist for open containers General    Pain Pain Assessment Pain Assessment: No/denies pain Pain Score: 0-No pain  Therapy/Group: Individual Therapy  Charlane Ferretti., CCC-SLP [phone]  [name] 07/26/2011, 4:36 PM

## 2011-07-26 NOTE — Patient Care Conference (Signed)
Inpatient RehabilitationTeam Conference Note Date: 07/26/2011   Time: 11:00AM    Patient Name: [name]      Medical Record Number: [id number]  Date of Birth: October 25, 1924 Sex: Male         Room/Bed: 4030/4030-01 Payor Info: Payor: Advertising copywriter MEDICARE  Plan: [employer]  Product Type: *No Product type*     Admitting Diagnosis: R cerebellar CVA  Admit Date/Time:  07/24/2011  3:04 PM Admission Comments: No comment available   Primary Diagnosis:  CVA (cerebral infarction) Principal Problem: CVA (cerebral infarction)  Patient Active Problem List  Diagnoses Date Noted  . Hearing loss 07/20/2011  . Meningioma 07/20/2011  . HTN (hypertension) 07/19/2011  . CVA (cerebral infarction) 07/19/2011    Expected Discharge Date: Expected Discharge Date: 08/15/11  Team Members Present: Physician: Dr. Claudette Laws Case Manager Present: Lutricia Horsfall, RN Social Worker Present: Dossie Der, LCSW PT Present: Edman Circle, Lillie Columbia, PT OT Present: Bretta Bang, Verlene Mayer, OT SLP Present: Fae Pippin, SLP     Current Status/Progress Goal Weekly Team Focus  Medical   severe balance disorder, hard of hearing  improve sitting balance, focus on wheelchair level goals  improve tolerance of upright posture   Bowel/Bladder   Can be incontinent of bowel and bladder at times. LBM 07/25/11  Continent of bowel and bladder  Initiate time toileting   Swallow/Nutrition/ Hydration             ADL's   Pt currently supervision for UB selfcare and mod assist for LB bathing, max assist for LB dressing.  Max assist for toilet transfers and toileting   Goals set at overall supervision level for selfcare and toileting.  Selfcare retraining, balance, RUE coordination gorss and fine motor, safety awareness   Mobility   mod-max assist transfer; + 2 gait x 30'; min assist w/c x 60'  supervision basic transfer; min assist car transfer; supervision w/c 150'; Gait and stairs TBD   midline orientation, neuro re-ed, balance, functional mobility   Communication   mod assist  supervision  increase carryover and self monitoring    Safety/Cognition/ Behavioral Observations  max assist  min assist  increase awareness and carryover   Pain   No c/o  <3  Monitor   Skin   Clean, dry, intact  No additonal skin breakdown  Turn q 2hrs      *See Interdisciplinary Assessment and Plan and progress notes for long and short-term goals  Barriers to Discharge:  truncal control    Possible Resolutions to Barriers:  continued therapy, custom wheelchair    Discharge Planning/Teaching Needs:  To daughter's home- family still discussing discharge plan and 24 hr coverage      Team Discussion:  Very HOH. Max assist transfers. No gait goals for now. W/C level goals. Diarrhea, incontinent, pt unaware. Question if C-Diff. Discussion of d/c plan.  Revisions to Treatment Plan:  none   Continued Need for Acute Rehabilitation Level of Care: The patient requires daily medical management by a physician with specialized training in physical medicine and rehabilitation for the following conditions: Daily direction of a multidisciplinary physical rehabilitation program to ensure safe treatment while eliciting the highest outcome that is of practical value to the patient.: Yes Daily medical management of patient stability for increased activity during participation in an intensive rehabilitation regime.: Yes Daily analysis of laboratory values and/or radiology reports with any subsequent need for medication adjustment of medical intervention for : Neurological problems  Meryl Dare 07/26/2011, 3:11  PM  

## 2011-07-26 NOTE — Progress Notes (Signed)
Occupational Therapy Session Note  Patient Details  Name: [name] MRN: [id number] Date of Birth: 1924/09/17  Today's Date: 07/26/2011 Time: 1440-1500 Time Calculation (min): 20 min  Short Term Goals: Week 1:  OT Short Term Goal 1 (Week 1): Pt will perfrom LB bathing 2/3 sessions with min assist sit to stand. OT Short Term Goal 2 (Week 1): Pt will perfrom LB dressing with min assist sit to stand 2/3 sessions. OT Short Term Goal 3 (Week 1): Pt will transfer stand pivot to and from the 3:1 with min assist. OT Short Term Goal 4 (Week 1): Pt will perform 2 grooming tasks in standing with min no more than min assist for balance.  Skilled Therapeutic Interventions/Progress Updates:    Pt out of room with PT upon entering for session.  Engaged in therapeutic activity with focus on sit <> stand and static and dynamic standing balance both with visual cues at mirror and without visual cues.  Pt required min assist sit to stand at sink with BUE support and static standing without physical assist for approx 30 seconds before requesting a rest break.  Pt demonstrated increased standing balance with visual cues and with sink or table in front of patient to provide UE support.  Pt required mod assist sit <> stand without sink in front of pt, as he wanted something to pull up on - cues to push up from chair with assist at hips for weight shifting once in standing.  Pt required increased assist and cues in standing for upright standing without visual cues.  Engaged in weight shifting in standing to assist with LB dressing.  Therapy Documentation Precautions:  Precautions Precautions: Fall Precaution Comments: leans L and posterior in sitting; leans significantly R in standing Restrictions Weight Bearing Restrictions: No General:   Vital Signs: Therapy Vitals Temp: 97.9 F (36.6 C) Temp src: Oral Pulse Rate: 80  Resp: 18  BP: 120/69 mmHg Patient Position, if appropriate: Sitting Oxygen  Therapy SpO2: 99 % O2 Device: None (Room air) Pain:   Pt with no c/o pain this session.  See FIM for current functional status  Therapy/Group: Individual Therapy  Leonette Monarch 07/26/2011, 3:46 PM

## 2011-07-26 NOTE — Progress Notes (Signed)
Physical Therapy Session Note  Patient Details  Name: [name] MRN: [id number] Date of Birth: 1924/10/12  Today's Date: 07/26/2011 Time: 1350-1430 Time Calculation (min): 40 min  Short Term Goals: Week 1:  PT Short Term Goal 1 (Week 1): Pt will transfer bed>< w/c with min assist to L and R, consistently. PT Short Term Goal 2 (Week 1): Pt will propel w/c x 160' with supervision. PT Short Term Goal 3 (Week 1): Pt will self-correct midline orientation in static standing with use of visual feedback, x 1 minute, with mod assist. PT Short Term Goal 4 (Week 1): Pt will perform gait x 30' with mod assist of 1 person, safety stand-by assist of another person.  Skilled Therapeutic Interventions/Progress Updates: focus on neuro re-ed for trunk activation, trunk righting, standing balance, pre-gait, w/c mobility  W/c mobility x 60' with supervision,  for RUE functional task  W/c>< mat with mod assist, squat pivot with VCs  Sitting on mat performed trunk shortening/lengthening with manual cues with feet lightly supported, no UE support.    Reaching activity in sitting with LOB q reach to R, recovered without cues or assist.  Midline orientation using mirror for visual feedback, due to slight R lean in sitting. Pt self-corrected with cues and min assist x 1 minute.  Pre-gait using mirror, for wt shifting, stance and swing phase components RLE.  HOH vs cognitive deficits- pt incorrectly named several familiar objects as he handled them.  Therapy Documentation Precautions:  Precautions Precautions: Fall Precaution Comments: leans heavily to right in standing Restrictions Weight Bearing Restrictions: No   Pain: Pain Assessment Pain Assessment: No/denies pain Pain Score: 0-No pain        See FIM for current functional status  Therapy/Group: Individual Therapy  Jamarr Treinen 07/26/2011, 4:44 PM

## 2011-07-26 NOTE — Progress Notes (Signed)
Social Work Patient ID: WILIAM CAUTHORN, male   DOB: May 17, 1924, 78 y.o.   MRN: [id number] Spoke with daughter via telephone to inform team conference goals-supervision wheelchair level and target date June 4.  She applied for Medicaid for him yesterday-worker is Rolm Gala [phone].  Will contact her regarding  Any information needed from the hospital.  She reports her son's can be there with him during the day while she works She will be there in the evenings.  Continue to work on discharge needs.  Daughter agreeable to team conference goals.

## 2011-07-26 NOTE — Progress Notes (Signed)
Occupational Therapy Session Note  Patient Details  Name: [name] MRN: [id number] Date of Birth: 06-21-1924  Today's Date: 07/26/2011 Time: 4098-1191 Time Calculation (min): 59 min  Short Term Goals: Week 1:  OT Short Term Goal 1 (Week 1): Pt will perfrom LB bathing 2/3 sessions with min assist sit to stand. OT Short Term Goal 2 (Week 1): Pt will perfrom LB dressing with min assist sit to stand 2/3 sessions. OT Short Term Goal 3 (Week 1): Pt will transfer stand pivot to and from the 3:1 with min assist. OT Short Term Goal 4 (Week 1): Pt will perform 2 grooming tasks in standing with min no more than min assist for balance.  Skilled Therapeutic Interventions/Progress Updates:    Worked on bathing in the shower while sitting on the seat.  Mod assist for sit to stand when attempting to wash his peri area.  Performed dressing sit to stand at the sink.  Needs mod instructional cueing to sequence sit to stand including to scoot forward to the edge of the chair and push up from the arm rests.  Mod assist for sit to stand  When pulling pants over hips.  Currently wearing incontinent brief as well.  Pt able to donn socks and shoes today with increased time.  Also able to tie them as well.  Therapy Documentation Precautions:  Precautions Precautions: Fall Precaution Comments: leans L and posterior in sitting; leans significantly R in standing Restrictions Weight Bearing Restrictions: No Pain: Pain Assessment Pain Assessment: No/denies pain Pain Score: 0-No pain Faces Pain Scale: No hurt ADL: See FIM for current functional status  Therapy/Group: Individual Therapy  Nechuma Boven 07/26/2011, 12:16 PM

## 2011-07-26 NOTE — Progress Notes (Signed)
Patient ID: [name], male   DOB: [birth year], 78 y.o.   MRN: [id number] Subjective/Complaints:  Review of Systems  Neurological: Positive for dizziness and tremors.  All other systems reviewed and are negative.    Objective: Vital Signs: Blood pressure 125/68, pulse 86, temperature 98.4 F (36.9 C), temperature source Oral, resp. rate 16, height 5\' 7"  (1.702 m), weight 78.6 kg (173 lb 4.5 oz), SpO2 94.00%. No results found. Results for orders placed during the hospital encounter of 07/24/11 (from the past 72 hour(s))  CBC     Status: Abnormal   Collection Time   07/25/11  6:30 AM      Component Value Range Comment   WBC 9.6  4.0 - 10.5 (K/uL)    RBC 3.77 (*) 4.22 - 5.81 (MIL/uL)    Hemoglobin 12.0 (*) 13.0 - 17.0 (g/dL)    HCT 40.9 (*) 81.1 - 52.0 (%)    MCV 93.1  78.0 - 100.0 (fL)    MCH 31.8  26.0 - 34.0 (pg)    MCHC 34.2  30.0 - 36.0 (g/dL)    RDW 91.4  78.2 - 95.6 (%)    Platelets 109 (*) 150 - 400 (K/uL)   COMPREHENSIVE METABOLIC PANEL     Status: Abnormal   Collection Time   07/25/11  6:30 AM      Component Value Range Comment   Sodium 143  135 - 145 (mEq/L)    Potassium 4.1  3.5 - 5.1 (mEq/L)    Chloride 104  96 - 112 (mEq/L)    CO2 30  19 - 32 (mEq/L)    Glucose, Bld 143 (*) 70 - 99 (mg/dL)    BUN 24 (*) 6 - 23 (mg/dL)    Creatinine, Ser 2.13  0.50 - 1.35 (mg/dL)    Calcium 08.6 (*) 8.4 - 10.5 (mg/dL)    Total Protein 6.1  6.0 - 8.3 (g/dL)    Albumin 2.8 (*) 3.5 - 5.2 (g/dL)    AST 20  0 - 37 (U/L)    ALT 14  0 - 53 (U/L)    Alkaline Phosphatase 49  39 - 117 (U/L)    Total Bilirubin 0.3  0.3 - 1.2 (mg/dL)    GFR calc non Af Amer 59 (*) >90 (mL/min)    GFR calc Af Amer 68 (*) >90 (mL/min)   DIFFERENTIAL     Status: Abnormal   Collection Time   07/25/11  6:30 AM      Component Value Range Comment   Neutrophils Relative 76  43 - 77 (%)    Neutro Abs 7.3  1.7 - 7.7 (K/uL)    Lymphocytes Relative 11 (*) 12 - 46 (%)    Lymphs Abs 1.1  0.7 - 4.0 (K/uL)    Monocytes Relative 11  3 - 12 (%)    Monocytes Absolute 1.1 (*) 0.1 - 1.0 (K/uL)    Eosinophils Relative 2  0 - 5 (%)    Eosinophils Absolute 0.2  0.0 - 0.7 (K/uL)    Basophils Relative 0  0 - 1 (%)    Basophils Absolute 0.0  0.0 - 0.1 (K/uL)   CLOSTRIDIUM DIFFICILE BY PCR     Status: Abnormal   Collection Time   07/25/11  7:53 AM      Component Value Range Comment   C difficile by pcr POSITIVE (*) NEGATIVE       HEENT: normal Cardio: RRR Resp: CTA B/L GI: BS positive Extremity:  Pulses positive  and No Edema Skin:   Intact Neuro: Alert/Oriented, Cranial Nerve II-XII normal, Normal Sensory, Normal Motor and Other ataxia on finger to nose as well as heel to shin   Assessment/Plan: 1. Functional deficits secondary to R superior and lateral cerebellar infarct which require 3+ hours per day of interdisciplinary therapy in a comprehensive inpatient rehab setting. Physiatrist is providing close team supervision and 24 hour management of active medical problems listed below. Physiatrist and rehab team continue to assess barriers to discharge/monitor patient progress toward functional and medical goals. FIM: FIM - Bathing Bathing Steps Patient Completed: Chest;Right Arm;Left Arm;Abdomen;Right upper leg;Left upper leg Bathing: 3: Mod-Patient completes 5-7 77f 10 parts or 50-74%  FIM - Upper Body Dressing/Undressing Upper body dressing/undressing steps patient completed: Thread/unthread left sleeve of pullover shirt/dress;Thread/unthread right sleeve of pullover shirt/dresss;Put head through opening of pull over shirt/dress;Pull shirt over trunk Upper body dressing/undressing: 5: Supervision: Safety issues/verbal cues FIM - Lower Body Dressing/Undressing Lower body dressing/undressing steps patient completed: Thread/unthread right pants leg;Thread/unthread left pants leg Lower body dressing/undressing: 2: Max-Patient completed 25-49% of tasks  FIM - Toileting Toileting: 1: Total-Patient  completed zero steps, helper did all 3  FIM - Toilet Transfers Toilet Transfers: 2-To toilet/BSC: Max A (lift and lower assist);2-From toilet/BSC: Max A (lift and lower assist)  FIM - Bed/Chair Transfer Bed/Chair Transfer: 2: Chair or W/C > Bed: Max A (lift and lower assist)  FIM - Locomotion: Wheelchair Distance: 60 Locomotion: Wheelchair: 2: Travels 50 - 149 ft with minimal assistance (Pt.>75%) FIM - Locomotion: Ambulation Ambulation/Gait Assistance: 1: +2 Total assist Locomotion: Ambulation: 1: Two helpers  Comprehension Comprehension Mode: Auditory Comprehension: 4-Understands basic 75 - 89% of the time/requires cueing 10 - 24% of the time  Expression Expression Mode: Verbal Expression: 4-Expresses basic 75 - 89% of the time/requires cueing 10 - 24% of the time. Needs helper to occlude trach/needs to repeat words.  Social Interaction Social Interaction: 4-Interacts appropriately 75 - 89% of the time - Needs redirection for appropriate language or to initiate interaction.  Problem Solving Problem Solving: 4-Solves basic 75 - 89% of the time/requires cueing 10 - 24% of the time  Memory Memory: 4-Recognizes or recalls 75 - 89% of the time/requires cueing 10 - 24% of the time  Medical Problem List and Plan:  1. DVT Prophylaxis/Anticoagulation: Pharmaceutical: Lovenox  2. Pain Management: N/A  3. Mood: pleasant and appropriate.  4. HTN: monitor with bid checks. Continue Norvasc and benazepril. Recheck lytes in am.  5. Dizziness: Vestibular eval and treat. Monitor for worsening of symptoms with increased mobility.  6. Cdiff metronidazole     LOS (Days) 2 A FACE TO FACE EVALUATION WAS PERFORMED  Domnique Vantine E 07/26/2011, 7:15 AM

## 2011-07-27 NOTE — Progress Notes (Signed)
Speech Language Pathology Daily Session Note  Patient Details  Name: [name] MRN: [id number] Date of Birth: 1924/09/05  Today's Date: 07/27/2011 Time: 1515-1600 Time Calculation (min): 45 min  Short Term Goals: Week 1: SLP Short Term Goal 1 (Week 1): Patient will verbalize mildly complex needs/wants at the conversation level with mod verbal cues for self correction of verbal errors SLP Short Term Goal 2 (Week 1): Patient will utilize speech intelligibility strategies with min assist at the conversation level to improve intelligibility to 75%.  SLP Short Term Goal 3 (Week 1): Patient will demonstrate intact problem solving for moderately complex ADLs with moderate assist.  SLP Short Term Goal 4 (Week 1): Patient will demonstrate intellectual awareness of cognitive-linguistic and physical deficits during functional ADLs with mod assist.   Skilled Therapeutic Interventions: Session focused on skilled treatment of speech-language and cognition; SLP facilitated session with naming tasks patient named 10/10 basic ADL objects accurately; 10/10 environmental objects accurately; 9/10 pictures accurately and then required minimal assist semantic cues to self monitor verbal errors during basic conversation regarding biographical discussion.  SLP suspects patient's speech intelligibility and working hearing aid today have increased his functional ability to communicate as compared to yesterday's session.  Patient Mod I with written aids to recall speech intelligibility strategies and to locate room at end of therapy session.   Daily Session Precautions/Restrictions    FIM:  Comprehension Comprehension Mode: Auditory Comprehension: 4-Understands basic 75 - 89% of the time/requires cueing 10 - 24% of the time Expression Expression Mode: Verbal Expression: 5-Expresses basic 90% of the time/requires cueing < 10% of the time. Social Interaction Social Interaction: 5-Interacts appropriately 90% of  the time - Needs monitoring or encouragement for participation or interaction. Problem Solving Problem Solving: 5-Solves basic 90% of the time/requires cueing < 10% of the time Memory Memory: 4-Recognizes or recalls 75 - 89% of the time/requires cueing 10 - 24% of the time FIM - Eating Eating Activity: 5: Set-up assist for open containers General    Pain Pain Assessment Pain Assessment: No/denies pain Pain Score: 0-No pain  Therapy/Group: Individual Therapy  Charlane Ferretti., CCC-SLP [phone]  [name] 07/27/2011, 4:55 PM

## 2011-07-27 NOTE — Progress Notes (Signed)
Patient ID: [name], male   DOB: [birth year], 78 y.o.   MRN: [id number] Subjective/Complaints: Compensation for ataxia.  Diarrhea better Review of Systems  Neurological: Positive for dizziness and tremors.  All other systems reviewed and are negative.    Objective: Vital Signs: Blood pressure 120/78, pulse 76, temperature 98 F (36.7 C), temperature source Oral, resp. rate 18, height 5\' 7"  (1.702 m), weight 79.516 kg (175 lb 4.8 oz), SpO2 99.00%. No results found. Results for orders placed during the hospital encounter of 07/24/11 (from the past 72 hour(s))  CBC     Status: Abnormal   Collection Time   07/25/11  6:30 AM      Component Value Range Comment   WBC 9.6  4.0 - 10.5 (K/uL)    RBC 3.77 (*) 4.22 - 5.81 (MIL/uL)    Hemoglobin 12.0 (*) 13.0 - 17.0 (g/dL)    HCT 41.3 (*) 24.4 - 52.0 (%)    MCV 93.1  78.0 - 100.0 (fL)    MCH 31.8  26.0 - 34.0 (pg)    MCHC 34.2  30.0 - 36.0 (g/dL)    RDW 01.0  27.2 - 53.6 (%)    Platelets 109 (*) 150 - 400 (K/uL)   COMPREHENSIVE METABOLIC PANEL     Status: Abnormal   Collection Time   07/25/11  6:30 AM      Component Value Range Comment   Sodium 143  135 - 145 (mEq/L)    Potassium 4.1  3.5 - 5.1 (mEq/L)    Chloride 104  96 - 112 (mEq/L)    CO2 30  19 - 32 (mEq/L)    Glucose, Bld 143 (*) 70 - 99 (mg/dL)    BUN 24 (*) 6 - 23 (mg/dL)    Creatinine, Ser 6.44  0.50 - 1.35 (mg/dL)    Calcium 03.4 (*) 8.4 - 10.5 (mg/dL)    Total Protein 6.1  6.0 - 8.3 (g/dL)    Albumin 2.8 (*) 3.5 - 5.2 (g/dL)    AST 20  0 - 37 (U/L)    ALT 14  0 - 53 (U/L)    Alkaline Phosphatase 49  39 - 117 (U/L)    Total Bilirubin 0.3  0.3 - 1.2 (mg/dL)    GFR calc non Af Amer 59 (*) >90 (mL/min)    GFR calc Af Amer 68 (*) >90 (mL/min)   DIFFERENTIAL     Status: Abnormal   Collection Time   07/25/11  6:30 AM      Component Value Range Comment   Neutrophils Relative 76  43 - 77 (%)    Neutro Abs 7.3  1.7 - 7.7 (K/uL)    Lymphocytes Relative 11 (*) 12 - 46 (%)      Lymphs Abs 1.1  0.7 - 4.0 (K/uL)    Monocytes Relative 11  3 - 12 (%)    Monocytes Absolute 1.1 (*) 0.1 - 1.0 (K/uL)    Eosinophils Relative 2  0 - 5 (%)    Eosinophils Absolute 0.2  0.0 - 0.7 (K/uL)    Basophils Relative 0  0 - 1 (%)    Basophils Absolute 0.0  0.0 - 0.1 (K/uL)   CLOSTRIDIUM DIFFICILE BY PCR     Status: Abnormal   Collection Time   07/25/11  7:53 AM      Component Value Range Comment   C difficile by pcr POSITIVE (*) NEGATIVE       HEENT: normal Cardio: RRR Resp: CTA B/L  GI: BS positive Extremity:  Pulses positive and No Edema Skin:   Intact Neuro: Alert/Oriented, Cranial Nerve II-XII normal, Normal Sensory, Normal Motor and Other ataxia on finger to nose as well as heel to shin   Assessment/Plan: 1. Functional deficits secondary to R superior and lateral cerebellar infarct which require 3+ hours per day of interdisciplinary therapy in a comprehensive inpatient rehab setting. Physiatrist is providing close team supervision and 24 hour management of active medical problems listed below. Physiatrist and rehab team continue to assess barriers to discharge/monitor patient progress toward functional and medical goals. FIM: FIM - Bathing Bathing Steps Patient Completed: Chest;Right Arm;Left Arm;Abdomen;Right upper leg;Left upper leg;Right lower leg (including foot);Left lower leg (including foot) Bathing: 4: Min-Patient completes 8-9 18f 10 parts or 75+ percent  FIM - Upper Body Dressing/Undressing Upper body dressing/undressing steps patient completed: Thread/unthread right sleeve of pullover shirt/dresss;Pull shirt over trunk;Put head through opening of pull over shirt/dress;Thread/unthread left sleeve of pullover shirt/dress Upper body dressing/undressing: 5: Supervision: Safety issues/verbal cues FIM - Lower Body Dressing/Undressing Lower body dressing/undressing steps patient completed: Thread/unthread right pants leg;Thread/unthread left pants leg;Pull pants  up/down;Don/Doff right sock;Don/Doff left sock;Don/Doff right shoe;Don/Doff left shoe;Fasten/unfasten right shoe;Fasten/unfasten left shoe Lower body dressing/undressing: 4: Steadying Assist  FIM - Toileting Toileting: 1: Total-Patient completed zero steps, helper did all 3  FIM - Toilet Transfers Toilet Transfers: 2-To toilet/BSC: Max A (lift and lower assist);2-From toilet/BSC: Max A (lift and lower assist)  FIM - Bed/Chair Transfer Bed/Chair Transfer: 3: Bed > Chair or W/C: Mod A (lift or lower assist);3: Chair or W/C > Bed: Mod A (lift or lower assist)  FIM - Locomotion: Wheelchair Distance: 60 Locomotion: Wheelchair: 2: Travels 50 - 149 ft with supervision, cueing or coaxing FIM - Locomotion: Ambulation Ambulation/Gait Assistance: 1: +2 Total assist Locomotion: Ambulation: 0: Activity did not occur (pre-gait)  Comprehension Comprehension Mode: Auditory Comprehension: 4-Understands basic 75 - 89% of the time/requires cueing 10 - 24% of the time  Expression Expression Mode: Verbal Expression: 4-Expresses basic 75 - 89% of the time/requires cueing 10 - 24% of the time. Needs helper to occlude trach/needs to repeat words.  Social Interaction Social Interaction: 4-Interacts appropriately 75 - 89% of the time - Needs redirection for appropriate language or to initiate interaction.  Problem Solving Problem Solving: 4-Solves basic 75 - 89% of the time/requires cueing 10 - 24% of the time  Memory Memory: 3-Recognizes or recalls 50 - 74% of the time/requires cueing 25 - 49% of the time  Medical Problem List and Plan:  1. DVT Prophylaxis/Anticoagulation: Pharmaceutical: Lovenox  2. Pain Management: N/A  3. Mood: pleasant and appropriate.  4. HTN: monitor with bid checks. Continue Norvasc and benazepril. Recheck lytes in am.  5. Dizziness: Vestibular eval and treat. Monitor for worsening of symptoms with increased mobility.  6. Cdiff metronidazole colinization vs active infection  symptoms are improving finish 7 day course of metronidazole     LOS (Days) 3 A FACE TO FACE EVALUATION WAS PERFORMED  [name] 07/27/2011, 6:59 AM

## 2011-07-27 NOTE — Progress Notes (Signed)
Per State Regulation 482.30 This chart was reviewed for medical necessity with respect to the patient's Admission/Duration of stay. Pt on flagyl for c-diff. Dizziness improved. Participating and progressing in therapies. Meryl Dare                 Nurse Care Manager            Next Review Date: 07/31/11

## 2011-07-27 NOTE — Progress Notes (Signed)
Occupational Therapy Session Note  Patient Details  Name: [name] MRN: [id number] Date of Birth: 06/22/1924  Today's Date: 07/27/2011 Time: 0903-1000 Time Calculation (min): 57 min  Short Term Goals: Week 1:  OT Short Term Goal 1 (Week 1): Pt will perfrom LB bathing 2/3 sessions with min assist sit to stand. OT Short Term Goal 2 (Week 1): Pt will perfrom LB dressing with min assist sit to stand 2/3 sessions. OT Short Term Goal 3 (Week 1): Pt will transfer stand pivot to and from the 3:1 with min assist. OT Short Term Goal 4 (Week 1): Pt will perform 2 grooming tasks in standing with min no more than min assist for balance.  Skilled Therapeutic Interventions/Progress Updates:    Bathing and dressing session.  Pt transferred to walk-in shower with mod facilitation and max instructional cueing for technique.  Pt with decreased hearing and needed cues to turn and let go of the grab bar.  Sat on tub bench to bathe sit to stand.  Mod assist for standing using the grab bar to wash peri area.  Mod assist for transfer back to wheelchair.  Dressing sit to stand at the sink with min assist for sit to stand and standing balance statically, but max visual cueing to scoot forward to the edge of the chair, put feet further back under him, and to push up from chair.    Therapy Documentation Precautions:  Precautions Precautions: Fall Precaution Comments: leans heavily to right in standing Restrictions Weight Bearing Restrictions: No   Vital Signs: Therapy Vitals Temp: 98 F (36.7 C) Pulse Rate: 76  Resp: 18  BP: 120/78 mmHg Oxygen Therapy SpO2: 96 % O2 Device: None (Room air) Pulse Oximetry Type: Intermittent Pain: Pain Assessment Pain Assessment: No/denies pain Pain Score: 0-No pain Faces Pain Scale: No hurt PAINAD (Pain Assessment in Advanced Dementia) Breathing: normal ADL: See FIM for current functional status  Therapy/Group: Individual Therapy  Alethea Terhaar 07/27/2011,  10:02 AM

## 2011-07-27 NOTE — Progress Notes (Signed)
Physical Therapy Note  Patient Details  Name: [name] MRN: [id number] Date of Birth: 11/01/1924 Today's Date: 07/27/2011  1000-1055 (55 minutes) individual Pain: no complaint of pain Focus of treatment: Therapeutic activities to facilitate erect midline stance; gait training Treatment: Transfer wc to mat with max directional cues for setup and min assist scoot (level); sit to stand min assist to mod assist in standing without UE support; gait training- 30 feet mod assist RW with max cues to abduct RT LE during swing to stance and decreased weight shift to left; gait with rail left 15 feet with min/mod assist and max assist during turns second to inconsistent advance of RT LE; marching in place with rail left - pt cannot maintain weight shift to left while marching RT LE; standing UE ergonometer Level 2 X 5 minutes to facilitate midline stance during dynamic activity.   9147-8295 (30 minutes) individual Pain: no complaint of pain Focus of treatment: therapeutic activities to facilitate midline stance/ weight shifts prior to stepping/ stepping with decreased lean to right Treatment: Wall standing focusing on weight shifts at hips and alternate stepping with max tactile cues for midline position and mod assist for standing balance. Pt attempts to step on right with plantarflexed ankle.  Tian Davison,JIM 07/27/2011, 10:51 AM

## 2011-07-28 DIAGNOSIS — Z5189 Encounter for other specified aftercare: Secondary | ICD-10-CM

## 2011-07-28 DIAGNOSIS — I6330 Cerebral infarction due to thrombosis of unspecified cerebral artery: Secondary | ICD-10-CM

## 2011-07-28 DIAGNOSIS — G8110 Spastic hemiplegia affecting unspecified side: Secondary | ICD-10-CM

## 2011-07-28 NOTE — Progress Notes (Signed)
Speech Language Pathology Daily Session Note  Patient Details  Name: [name] MRN: [id number] Date of Birth: 12/14/1924  Today's Date: 07/28/2011 Time: 1330-1400 Time Calculation (min): 30 min  Short Term Goals: Week 1: SLP Short Term Goal 1 (Week 1): Patient will verbalize mildly complex needs/wants at the conversation level with mod verbal cues for self correction of verbal errors SLP Short Term Goal 2 (Week 1): Patient will utilize speech intelligibility strategies with min assist at the conversation level to improve intelligibility to 75%.  SLP Short Term Goal 3 (Week 1): Patient will demonstrate intact problem solving for moderately complex ADLs with moderate assist.  SLP Short Term Goal 4 (Week 1): Patient will demonstrate intellectual awareness of cognitive-linguistic and physical deficits during functional ADLs with mod assist.   Skilled Therapeutic Interventions: Session focused on functional communication and carryover of speech intelligibility strategies; Patient referred to strategies written on board with modified independece but required mod assist to utilize them during speech with occasional moments of max assist to completely unintelligible, jargon sounding speech.  SLP requested if patient had hearing aid on and he reported that the battery was dead and that his daughter could bring one.  SLP cues patient to recall how long a hearing aid battery lasts (72 hours per his report) and suggested his daughter leaves some here for therapy/nursing to access as needed due to his hearing's impact on functional communication.  SLP requested to look at his hearing aid and after turning it on heard feedback; patient required mod assist to don hearing aid and utilize controls with improved understanding and speech intelligibility.   Daily Session Precautions/Restrictions  Precautions Precautions: Fall Precaution Comments: very HOH; no battery for hearing aid this AM FIM:    Comprehension Comprehension Mode: Auditory Comprehension: 2-Understands basic 25 - 49% of the time/requires cueing 51 - 75% of the time Expression Expression Mode: Verbal Expression: 3-Expresses basic 50 - 74% of the time/requires cueing 25 - 50% of the time. Needs to repeat parts of sentences. Social Interaction Social Interaction: 5-Interacts appropriately 90% of the time - Needs monitoring or encouragement for participation or interaction. Problem Solving Problem Solving: 3-Solves basic 50 - 74% of the time/requires cueing 25 - 49% of the time Memory Memory: 4-Recognizes or recalls 75 - 89% of the time/requires cueing 10 - 24% of the time General    Pain Pain Assessment Pain Assessment: No/denies pain Pain Score: 0-No pain  Therapy/Group: Individual Therapy  Charlane Ferretti., CCC-SLP [phone]  [name] 07/28/2011, 2:18 PM

## 2011-07-28 NOTE — Progress Notes (Signed)
Physical Therapy Session Note  Patient Details  Name: [name] MRN: [id number] Date of Birth: [birth year]  Today's Date: 07/28/2011 Time: 1000-1100 Time Calculation (min): 60 min  Short Term Goals: Week 1:  PT Short Term Goal 1 (Week 1): Pt will transfer bed>< w/c with min assist to L and R, consistently. PT Short Term Goal 2 (Week 1): Pt will propel w/c x 160' with supervision. PT Short Term Goal 3 (Week 1): Pt will self-correct midline orientation in static standing with use of visual feedback, x 1 minute, with mod assist. PT Short Term Goal 4 (Week 1): Pt will perform gait x 30' with mod assist of 1 person, safety stand-by assist of another person.  Skilled Therapeutic Interventions/Progress Updates: treatment very difficult due to hearing aid not working this AM; focus on neuro re-ed, functional mobility  Bed mobility on flat hospital bed without rails, set up and tactile cues for re-ed to include RLE when rolling L and when scooting up in bed, supervision.  Pt did not remember set-up position less than 1 minute later.  Rolling R and sitting up with close supervision.    Bed> w/c squat pivot transfer with min/mod assist.  W/c mobility x 157' using bil UEs with distant supervision including turns and backing up without cues.  Gait training x 18' x 2 in hall using L rail, with max assist for balance due to inconsistent R step lengths, overshifting to R.  Cues for upright posture and forward gaze, which pt self-corrected by 2nd trial.    Neuro re-ed via forced use and tactile cues while marching in standing on Kinetron, at 25 cm/second, x 3 minutes x 2.  Pt required mod assist to shift to L for LLE extension.    wc mobility using bil LEs for RLE neuro re-ed, with min assist x 157', exhibiting consistent step pattern with RLE.    Therapy Documentation Precautions:  Precautions Precautions: Fall Precaution Comments: very HOH; no battery for hearing aid this  AM Restrictions Weight Bearing Restrictions: No   Pain: Pain Assessment Pain Assessment: No/denies pain   Locomotion : Ambulation Ambulation/Gait Assistance: 2: Max assist  Trunk/Postural Assessment : Postural Control Postural Control: Deficits on evaluation Righting Reactions: Leans to R heavily in standing; can self correct with max cues, extra time; feels he is falling      See FIM for current functional status  Therapy/Group: Individual Therapy  Dash Cardarelli 07/28/2011, 11:17 AM

## 2011-07-28 NOTE — Progress Notes (Signed)
Occupational Therapy Session Note  Patient Details  Name: [name] MRN: [id number] Date of Birth: [birth year]  Today's Date: 07/28/2011 Time:  - 0900-0955 (55 min)  1st session                1400-1430  ( )  2nd session    Short Term Goals: Week 1:  OT Short Term Goal 1 (Week 1): Pt will perfrom LB bathing 2/3 sessions with min assist sit to stand. OT Short Term Goal 2 (Week 1): Pt will perfrom LB dressing with min assist sit to stand 2/3 sessions. OT Short Term Goal 3 (Week 1): Pt will transfer stand pivot to and from the 3:1 with min assist. OT Short Term Goal 4 (Week 1): Pt will perform 2 grooming tasks in standing with min no more than min assist for balance.  Skilled Therapeutic Interventions/Progress Updates:     1st session:  Engaged in bed mobilitty (min assist), transfers to wc, shower bench. Needed physical cues and facilitation for transfers  (mod to min a).  Exhibitied decreased rotational components in pelvis.  Leaned to right and left to clean periarea.  Ppt. Needed supervison with this activity.    2nd session:  Addressed toilet transfer, toileting, RUE therapeutic exercises/activities.   Pt. Propelled wc into bathroom with minimal assist and positioning for transfer.  He verbalized out loud what he needed to do did very well.  Utilzed RUE in coordination using connect 4 and repairing wc.        Therapy Documentation Precautions:  Precautions Precautions: Fall Precaution Comments: leans heavily to right in standing Restrictions Weight Bearing Restrictions: No       Pain:  1st and 2nd session:  No pain      See FIM for current functional status  Therapy/Group: Individual Therapy  Humberto Seals 07/28/2011, 9:41 AM

## 2011-07-28 NOTE — Progress Notes (Signed)
Patient ID: [name], male   DOB: [birth year], 78 y.o.   MRN: [id number] Subjective/Complaints:  Slept well, hearing aide has low battery Objective: Vital Signs: Blood pressure 126/68, pulse 88, temperature 98.2 F (36.8 C), temperature source Oral, resp. rate 17, height 5\' 7"  (1.702 m), weight 79.516 kg (175 lb 4.8 oz), SpO2 96.00%. No results found. Results for orders placed during the hospital encounter of 07/24/11 (from the past 72 hour(s))  CLOSTRIDIUM DIFFICILE BY PCR     Status: Abnormal   Collection Time   07/25/11  7:53 AM      Component Value Range Comment   C difficile by pcr POSITIVE (*) NEGATIVE       HEENT: normal Cardio: RRR Resp: CTA B/L GI: BS positive Extremity:  No Edema Skin:   Intact Neuro: Alert/Oriented, ataxia R Finger nose and heel / shin      Musc/Skel:  Normal  Assessment/Plan: 1. Functional deficits secondary to R cerebellar infarct which require 3+ hours per day of interdisciplinary therapy in a comprehensive inpatient rehab setting. Physiatrist is providing close team supervision and 24 hour management of active medical problems listed below. Physiatrist and rehab team continue to assess barriers to discharge/monitor patient progress toward functional and medical goals. FIM: FIM - Bathing Bathing Steps Patient Completed: Chest;Right Arm;Left Arm;Abdomen;Front perineal area;Buttocks;Right upper leg;Left upper leg;Right lower leg (including foot);Left lower leg (including foot) Bathing: 4: Steadying assist  FIM - Upper Body Dressing/Undressing Upper body dressing/undressing steps patient completed: Thread/unthread right sleeve of pullover shirt/dresss;Thread/unthread left sleeve of pullover shirt/dress;Put head through opening of pull over shirt/dress;Pull shirt over trunk Upper body dressing/undressing: 5: Supervision: Safety issues/verbal cues FIM - Lower Body Dressing/Undressing Lower body dressing/undressing steps patient completed:  Thread/unthread right pants leg;Thread/unthread left pants leg;Pull pants up/down;Don/Doff right sock;Don/Doff left sock;Don/Doff right shoe;Don/Doff left shoe;Fasten/unfasten left shoe;Fasten/unfasten right shoe Lower body dressing/undressing: 4: Steadying Assist  FIM - Toileting Toileting: 1: Total-Patient completed zero steps, helper did all 3  FIM - Toilet Transfers Toilet Transfers: 2-To toilet/BSC: Max A (lift and lower assist);2-From toilet/BSC: Max A (lift and lower assist)  FIM - Bed/Chair Transfer Bed/Chair Transfer: 3: Bed > Chair or W/C: Mod A (lift or lower assist);3: Chair or W/C > Bed: Mod A (lift or lower assist)  FIM - Locomotion: Wheelchair Distance: 60 Locomotion: Wheelchair: 2: Travels 50 - 149 ft with supervision, cueing or coaxing FIM - Locomotion: Ambulation Ambulation/Gait Assistance: 1: +2 Total assist Locomotion: Ambulation: 0: Activity did not occur (pre-gait)  Comprehension Comprehension Mode: Auditory Comprehension: 4-Understands basic 75 - 89% of the time/requires cueing 10 - 24% of the time  Expression Expression Mode: Verbal Expression: 5-Expresses basic 90% of the time/requires cueing < 10% of the time.  Social Interaction Social Interaction: 5-Interacts appropriately 90% of the time - Needs monitoring or encouragement for participation or interaction.  Problem Solving Problem Solving: 5-Solves complex 90% of the time/cues < 10% of the time  Memory Memory: 4-Recognizes or recalls 75 - 89% of the time/requires cueing 10 - 24% of the time  2. Anticoagulation/DVT prophylaxis with Pharmaceutical: Lovenox 3. Pain Management: tylenol Patient Active Hospital Problem List: 4.CVA (cerebral infarction) (07/19/2011)    POA: Yes   Assessment: R hemiataxia   Plan: Cont rehab, BP control, ASA 5.HTN (hypertension) (07/19/2011)    POA: Yes   Assessment: controlled   Plan: Cont amlodipine and lotensin 6. C diff-flagyll 7 d    LOS (Days) 4 A FACE TO FACE  EVALUATION WAS PERFORMED  Claudette Laws  E 07/28/2011, 6:59 AM

## 2011-07-29 DIAGNOSIS — I6330 Cerebral infarction due to thrombosis of unspecified cerebral artery: Secondary | ICD-10-CM

## 2011-07-29 DIAGNOSIS — G8110 Spastic hemiplegia affecting unspecified side: Secondary | ICD-10-CM

## 2011-07-29 DIAGNOSIS — Z5189 Encounter for other specified aftercare: Secondary | ICD-10-CM

## 2011-07-29 NOTE — Progress Notes (Signed)
Occupational Therapy Session Note  Patient Details  Name: [name] MRN: [id number] Date of Birth: [birth year]  Today's Date: 07/29/2011 Time: 0915-1000 Time Calculation (min): 45 min  Skilled Therapeutic Interventions/Progress Updates: ADL in shower and dressing in w/c at sink with focus on static and dynamic balance for transfers, sit to stand and LB dressing;  Patient complete bed to w/c stand pivot transfer with Min A this am    Therapy Documentation Precautions: HOH; hearing aide in left ear today; Falls  Pain:denied   ADL: on tub transfer bench in shower; dressing in w/c sink  See FIM for current functional status  Therapy/Group: Individual Therapy  [facility] Face [facility] 07/29/2011, 4:31 PM

## 2011-07-29 NOTE — Progress Notes (Signed)
Patient ID: [name], male   DOB: 1925-01-04, 78 y.o.   MRN: [id number] Patient ID: [name], male   DOB: May 11, 1924, 78 y.o.   MRN: [id number] Subjective/Complaints: No new complaints Review of Systems  Neurological: Positive for dizziness and tremors.  All other systems reviewed and are negative.    Objective: Vital Signs: Blood pressure 124/64, pulse 73, temperature 98.3 F (36.8 C), temperature source Oral, resp. rate 18, height 5\' 7"  (1.702 m), weight 79.516 kg (175 lb 4.8 oz), SpO2 99.00%. No results found. No results found for this or any previous visit (from the past 72 hour(s)).   HEENT: normal Cardio: RRR Resp: CTA B/L GI: BS positive Extremity:  Pulses positive and No Edema Skin:   Intact Neuro: Alert/Oriented, Cranial Nerve II-XII normal, Normal Sensory, Normal Motor and Other ataxia on finger to nose as well as heel to shin No changes 5/18  Assessment/Plan: 1. Functional deficits secondary to R superior and lateral cerebellar infarct which require 3+ hours per day of interdisciplinary therapy in a comprehensive inpatient rehab setting. Physiatrist is providing close team supervision and 24 hour management of active medical problems listed below. Physiatrist and rehab team continue to assess barriers to discharge/monitor patient progress toward functional and medical goals. FIM: FIM - Bathing Bathing Steps Patient Completed: Chest;Right Arm;Left Arm;Abdomen;Front perineal area;Buttocks;Right upper leg;Left upper leg;Right lower leg (including foot);Left lower leg (including foot) Bathing: 4: Steadying assist  FIM - Upper Body Dressing/Undressing Upper body dressing/undressing steps patient completed: Thread/unthread right sleeve of pullover shirt/dresss;Thread/unthread left sleeve of pullover shirt/dress;Put head through opening of pull over shirt/dress;Pull shirt over trunk Upper body dressing/undressing: 5: Supervision: Safety issues/verbal cues FIM -  Lower Body Dressing/Undressing Lower body dressing/undressing steps patient completed: Thread/unthread right pants leg;Thread/unthread left pants leg;Pull pants up/down;Don/Doff right sock;Don/Doff left sock;Don/Doff right shoe;Don/Doff left shoe Lower body dressing/undressing: 4: Min-Patient completed 75 plus % of tasks  FIM - Toileting Toileting steps completed by patient: Adjust clothing prior to toileting;Performs perineal hygiene;Adjust clothing after toileting Toileting: 5: Set-up assist to: Obtain supplies  FIM - Diplomatic Services operational officer Devices: Grab bars Toilet Transfers: 4-To toilet/BSC: Min A (steadying Pt. > 75%);4-From toilet/BSC: Min A (steadying Pt. > 75%)  FIM - Bed/Chair Transfer Bed/Chair Transfer: 5: Supine > Sit: Supervision (verbal cues/safety issues);5: Sit > Supine: Supervision (verbal cues/safety issues);3: Bed > Chair or W/C: Mod A (lift or lower assist);3: Chair or W/C > Bed: Mod A (lift or lower assist)  FIM - Locomotion: Wheelchair Distance: 60 Locomotion: Wheelchair: 5: Travels 150 ft or more: maneuvers on rugs and over door sills with supervision, cueing or coaxing FIM - Locomotion: Ambulation Locomotion: Ambulation Assistive Devices: Other (comment) (hallway rail on L) Ambulation/Gait Assistance: 2: Max assist Locomotion: Ambulation: 1: Travels less than 50 ft with maximal assistance (Pt: 25 - 49%)  Comprehension Comprehension Mode: Auditory Comprehension: 3-Understands basic 50 - 74% of the time/requires cueing 25 - 50%  of the time  Expression Expression Mode: Verbal Expression: 2-Expresses basic 25 - 49% of the time/requires cueing 50 - 75% of the time. Uses single words/gestures.  Social Interaction Social Interaction: 5-Interacts appropriately 90% of the time - Needs monitoring or encouragement for participation or interaction.  Problem Solving Problem Solving: 3-Solves basic 50 - 74% of the time/requires cueing 25 - 49% of the  time  Memory Memory: 4-Recognizes or recalls 75 - 89% of the time/requires cueing 10 - 24% of the time  Medical Problem List and Plan:  1.  DVT Prophylaxis/Anticoagulation: Pharmaceutical: Lovenox  2. Pain Management: N/A  3. Mood: pleasant and appropriate.  4. HTN: monitor with bid checks. Continue Norvasc and benazepril.  5. Dizziness: Vestibular eval and treat. Monitor for worsening of symptoms with increased mobility. Pt continues to work through. 6. Cdiff metronidazole colinization vs active infection symptoms-flagyl for 7 day course     LOS (Days) 5 A FACE TO FACE EVALUATION WAS PERFORMED  [name] T 07/29/2011, 6:45 AM

## 2011-07-29 NOTE — Progress Notes (Signed)
Physical Therapy Session Note  Patient Details  Name: [name] MRN: [id number] Date of Birth: 16-Oct-1924  Today's Date: 07/29/2011 Time: 1007-1100 and 4098-1191 Time Calculation (min): 53 min and 45 min  Short Term Goals: Week 1:  PT Short Term Goal 1 (Week 1): Pt will transfer bed>< w/c with min assist to L and R, consistently. PT Short Term Goal 2 (Week 1): Pt will propel w/c x 160' with supervision. PT Short Term Goal 3 (Week 1): Pt will self-correct midline orientation in static standing with use of visual feedback, x 1 minute, with mod assist. PT Short Term Goal 4 (Week 1): Pt will perform gait x 30' with mod assist of 1 person, safety stand-by assist of another person.  Therapy Documentation Precautions:  Precautions Precautions: Fall Precaution Comments: very HOH Restrictions Weight Bearing Restrictions: No Pain: Pain Assessment Pain Assessment: No/denies pain Mobility:  Patient performed sit to stand at sink to don pants with mod A for hand placement and for balance while pulling pants up; transferred w/c > mat transfer with squat pivot to R side with mod A and verbal and tactile cues for UE placement and sequence. Locomotion : Ambulation Ambulation/Gait Assistance: 2: Pre-gait training in standing with EVA walker with focus on RLE controlled toe taps to visual target forward and across midline and toe taps with LLE for stance control on RLE; gait x 50' in controlled environment first with EVA walker for bilat UE support, upright trunk and use of wheels as visual target for RLE full step length and placement, 2nd 50 ft with LUE in forward flexion against wall to facilitate L lateral weight shift and anterior weight shift but caused increased pushing backwards and to the R; changed to L wall rail for forward and L lateral stepping with Max assist for lateral weight shifting and attention to RLE for full step length and foot clearance.  PM session: performed stair training  with bilat UE support on rails while performing up and down 3 stairs (4" tall) x 2 reps with mod A with patient ascending and descending with RLE for placement and L lateral weight shifting.  Gait with EVA walker x 50 ft with mod-max A with manual facilitation for L and anterior weight shifting and verbal and visual cues for RLE full step length, placement and foot clearance.  Wheelchair Mobility Distance: 150 with min A for attention to RUE placement on rim and for propulsion sequence to maintain straight navigation in hallway; one rest break due to UE fatigue.  Exercises:  PM session: performed bilat UE and LE strengthening, coordination and endurance training on Nustep at level 6 x 5 min with rest break  Other Treatments:  NMR: NMR in sitting for RLE for increased coordination, muscle grading and motor control with foot on soccer ball performing controlled forward, backward and lateral movement of ball and passing ball between feet.  Performed standing L lateral reaching for objects for increased dynamic weight shift to L. PM session: NMR for increased RLE coordination, muscle grading and motor control during standing with bilat UE support during RLE toe taps to visual target in front of patient and across midline for rotation and weight shifting on 4" step for increased foot clearance; began with simple toe tapping patterns progressing to more complex pattern with patient verbalizing pattern as he tapped for memory and sequencing.  See FIM for current functional status  Therapy/Group: Individual Therapy  Edman Circle [facility] 07/29/2011, 11:46 AM

## 2011-07-29 NOTE — Progress Notes (Signed)
Speech Language Pathology Daily Session Note  Patient Details  Name: [name] MRN: [id number] Date of Birth: 02-Apr-1924  Today's Date: 07/29/2011 Time: 1115-1200 Time Calculation (min): 45 min  Short Term Goals: Week 1: SLP Short Term Goal 1 (Week 1): Patient will verbalize mildly complex needs/wants at the conversation level with mod verbal cues for self correction of verbal errors SLP Short Term Goal 2 (Week 1): Patient will utilize speech intelligibility strategies with min assist at the conversation level to improve intelligibility to 75%.  SLP Short Term Goal 3 (Week 1): Patient will demonstrate intact problem solving for moderately complex ADLs with moderate assist.  SLP Short Term Goal 4 (Week 1): Patient will demonstrate intellectual awareness of cognitive-linguistic and physical deficits during functional ADLs with mod assist.   Skilled Therapeutic Interventions: Pt with improved speech clarity today, directing clinician's attention to sign on wall with intelligibility strategies and implementing them, initially, without cues.  During course of session, required mod verbal reminders to execute these strategies.  Expressed opinions re: lunch choices and overall opinion of food from cafeteria with no language errors, only those dysarthric in nature.  Described deficits s/p CVA with mod verbal cues to identify potential impact on function. 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  Daughter present for session - very supportive.  Overall, with improved awareness and attention to speech for short durations.    Daily Session Precautions/Restrictions    FIM:  Comprehension Comprehension Mode: Auditory Comprehension: 4-Understands basic 75 - 89% of the time/requires cueing 10 - 24% of the time Expression Expression Mode: Verbal Expression: 4-Expresses basic 75 - 89% of the time/requires cueing  10 - 24% of the time. Needs helper to occlude trach/needs to repeat words. Social Interaction Social Interaction: 5-Interacts appropriately 90% of the time - Needs monitoring or encouragement for participation or interaction. Problem Solving Problem Solving: 3-Solves basic 50 - 74% of the time/requires cueing 25 - 49% of the time Memory Memory: 4-Recognizes or recalls 75 - 89% of the time/requires cueing 10 - 24% of the time General    Pain Pain Assessment Pain Assessment: No/denies pain  Therapy/Group: Individual Therapy  Jeremie Giangrande L. Samson [place], [place] CCC/SLP Pager [phone]  Blenda Mounts Laurice 07/29/2011, 3:44 PM

## 2011-07-30 NOTE — Progress Notes (Signed)
Occupational Therapy Session Note  Patient Details  Name: SUMEET GETER MRN: [id number] Date of Birth: April 26, 1924  Today's Date: 07/30/2011 Time:  - 0940-1030    Short Term Goals: Week 1:  OT Short Term Goal 1 (Week 1): Pt will perfrom LB bathing 2/3 sessions with min assist sit to stand. OT Short Term Goal 2 (Week 1): Pt will perfrom LB dressing with min assist sit to stand 2/3 sessions. OT Short Term Goal 3 (Week 1): Pt will transfer stand pivot to and from the 3:1 with min assist. OT Short Term Goal 4 (Week 1): Pt will perform 2 grooming tasks in standing with min no more than min assist for balance.  Skilled Therapeutic Interventions/Progress Updates:    Pt. Lying in bed upon OT arrival.  Addressed bed mobility, sitting balance EOB to doff clothes, wc mobility from bed to shower stall, standing balance, and RUE coordination and fluid movements.   Pt. Needed physical cues during transfer to incorporate more body rotation when pivoting.  He propelled wc into shower with mod assist on uneven surface.  Pt. Directed his organization with self talking throughout the session.  Dressed at sink with sit to stand to don pants.  Pt. Eager to improve.  OT provided feedback about how he was doing at end of session.  Therapy Documentation Precautions:  Precautions Precautions: Fall Precaution Comments: very HOH; no battery for hearing aid this AM Restrictions Weight Bearing Restrictions: No   Pain:  none   See FIM for current functional status  Therapy/Group: Individual Therapy  Humberto Seals 07/30/2011, 9:49 AM

## 2011-07-30 NOTE — Progress Notes (Signed)
Subjective/Complaints: No new complaints- slept well Review of Systems  Neurological: Positive for dizziness and tremors.  All other systems reviewed and are negative.    Objective: Vital Signs: Blood pressure 125/70, pulse 83, temperature 98.2 F (36.8 C), temperature source Oral, resp. rate 18, height 5\' 7"  (1.702 m), weight 79.516 kg (175 lb 4.8 oz), SpO2 95.00%. No results found. No results found for this or any previous visit (from the past 72 hour(s)).   HEENT: normal Cardio: RRR Resp: CTA B/L GI: BS positive Extremity:  Pulses positive and No Edema Skin:   Intact Neuro: Alert/Oriented, Cranial Nerve II-XII normal, Normal Sensory, Normal Motor and Other ataxia on finger to nose as well as heel to shin Still ataxic, dysarthric, HOH  Assessment/Plan: 1. Functional deficits secondary to R superior and lateral cerebellar infarct which require 3+ hours per day of interdisciplinary therapy in a comprehensive inpatient rehab setting. Physiatrist is providing close team supervision and 24 hour management of active medical problems listed below. Physiatrist and rehab team continue to assess barriers to discharge/monitor patient progress toward functional and medical goals. FIM: FIM - Bathing Bathing Steps Patient Completed: Chest;Right Arm;Left Arm;Abdomen;Front perineal area;Right upper leg;Left upper leg;Right lower leg (including foot);Left lower leg (including foot) (tub bench in room showers; buttocks w lateral leans & assist) Bathing: 4: Min-Patient completes 8-9 29f 10 parts or 75+ percent  FIM - Upper Body Dressing/Undressing Upper body dressing/undressing steps patient completed: Thread/unthread right sleeve of pullover shirt/dresss;Thread/unthread left sleeve of pullover shirt/dress;Put head through opening of pull over shirt/dress;Pull shirt over trunk Upper body dressing/undressing: 5: Supervision: Safety issues/verbal cues FIM - Lower Body Dressing/Undressing Lower body  dressing/undressing steps patient completed: Thread/unthread right pants leg;Thread/unthread left pants leg;Don/Doff right sock;Don/Doff left sock;Don/Doff right shoe;Don/Doff left shoe Lower body dressing/undressing: 4: Min-Patient completed 75 plus % of tasks  FIM - Toileting Toileting steps completed by patient: Adjust clothing prior to toileting;Performs perineal hygiene;Adjust clothing after toileting Toileting: 0: Activity did not occur  FIM - Diplomatic Services operational officer Devices: Grab bars Toilet Transfers: 0-Activity did not occur  FIM - Games developer Transfer: 5: Supine > Sit: Supervision (verbal cues/safety issues);4: Bed > Chair or W/C: Min A (steadying Pt. > 75%)  FIM - Locomotion: Wheelchair Distance: 150 Locomotion: Wheelchair: 4: Travels 150 ft or more: maneuvers on rugs and over door sillls with minimal assistance (Pt.>75%) FIM - Locomotion: Ambulation Locomotion: Ambulation Assistive Devices: Consulting civil engineer (comment) (L hallway rail) Ambulation/Gait Assistance: 2: Max assist Locomotion: Ambulation: 2: Travels 50 - 149 ft with maximal assistance (Pt: 25 - 49%)  Comprehension Comprehension Mode: Auditory Comprehension: 4-Understands basic 75 - 89% of the time/requires cueing 10 - 24% of the time  Expression Expression Mode: Verbal Expression: 4-Expresses basic 75 - 89% of the time/requires cueing 10 - 24% of the time. Needs helper to occlude trach/needs to repeat words.  Social Interaction Social Interaction: 5-Interacts appropriately 90% of the time - Needs monitoring or encouragement for participation or interaction.  Problem Solving Problem Solving: 3-Solves basic 50 - 74% of the time/requires cueing 25 - 49% of the time  Memory Memory: 4-Recognizes or recalls 75 - 89% of the time/requires cueing 10 - 24% of the time  Medical Problem List and Plan:  1. DVT Prophylaxis/Anticoagulation: Pharmaceutical: Lovenox  2. Pain  Management: N/A  3. Mood: pleasant and appropriate.  4. HTN: monitor with bid checks. Continue Norvasc and benazepril.  5. Dizziness: Vestibular eval and treat. Monitor for worsening of symptoms with  increased mobility. Pt continues to work through. 6. Cdiff metronidazole colinization vs active infection symptoms-flagyl for 7 day course  -only one stool yesterday     LOS (Days) 6 A FACE TO FACE EVALUATION WAS PERFORMED  [name] T 07/30/2011, 7:23 AM

## 2011-07-31 NOTE — Progress Notes (Signed)
Occupational Therapy Session Note  Patient Details  Name: TREVIS EDEN MRN: [id number] Date of Birth: [birth year]  Today's Date: 07/31/2011 Time: 1003-1100 Time Calculation (min): 57 min  Short Term Goals: Week 1:  OT Short Term Goal 1 (Week 1): Pt will perfrom LB bathing 2/3 sessions with min assist sit to stand. OT Short Term Goal 2 (Week 1): Pt will perfrom LB dressing with min assist sit to stand 2/3 sessions. OT Short Term Goal 3 (Week 1): Pt will transfer stand pivot to and from the 3:1 with min assist. OT Short Term Goal 4 (Week 1): Pt will perform 2 grooming tasks in standing with min no more than min assist for balance.  Skilled Therapeutic Interventions/Progress Updates:  Self care retraining to include shower transfers, bath, dress and functional mobility.  Focus session on safe techniques during transfers, with sit>< stands, and with standing balance during BADL tasks as well as hip flexion and forward weight shifts for smooth sit><stand transitions, use of hand on lower thigh to assist with sit><stands.  While in standing in front of mirror, practiced lateral weight shifts while in a squat position with min/steady assist.  Therapy Documentation Precautions:  Precautions Precautions: Fall Precaution Comments: very HOH Restrictions Weight Bearing Restrictions: No Pain: Mild edema noted in left ankle and patient reports "some pain" not rated.  See FIM for current functional status  Therapy/Group: Individual Therapy  Marea Reasner 07/31/2011, 2:15 PM

## 2011-07-31 NOTE — Progress Notes (Signed)
Speech Language Pathology Daily Session Note  Patient Details  Name: [name] MRN: [id number] Date of Birth: [birth year]  Today's Date: 07/31/2011 Time: 7829-5621 Time Calculation (min): 45 min  Short Term Goals: Week 1: SLP Short Term Goal 1 (Week 1): Patient will verbalize mildly complex needs/wants at the conversation level with mod verbal cues for self correction of verbal errors SLP Short Term Goal 2 (Week 1): Patient will utilize speech intelligibility strategies with min assist at the conversation level to improve intelligibility to 75%.  SLP Short Term Goal 3 (Week 1): Patient will demonstrate intact problem solving for moderately complex ADLs with moderate assist.  SLP Short Term Goal 4 (Week 1): Patient will demonstrate intellectual awareness of cognitive-linguistic and physical deficits during functional ADLs with mod assist.   Skilled Therapeutic Interventions: Session focused on functional communication and complex cognition tasks; SLP facilitated session with supervision semantic cues to self monitor accuracy of verbal expression with sating room number and type of pudding with lunch (subtle naming errors) and required supervision semantic cues to self monitor verbal expressio with use of slow and loud speech and minimal assist semantic cues to recall current medications and functions.    Daily Session Precautions/Restrictions    FIM:  Comprehension Comprehension Mode: Auditory Comprehension: 4-Understands basic 75 - 89% of the time/requires cueing 10 - 24% of the time Expression Expression Mode: Verbal Expression: 4-Expresses basic 75 - 89% of the time/requires cueing 10 - 24% of the time. Needs helper to occlude trach/needs to repeat words. Social Interaction Social Interaction: 5-Interacts appropriately 90% of the time - Needs monitoring or encouragement for participation or interaction. Problem Solving Problem Solving: 3-Solves basic 50 - 74% of the time/requires  cueing 25 - 49% of the time Memory Memory: 4-Recognizes or recalls 75 - 89% of the time/requires cueing 10 - 24% of the time FIM - Eating Eating Activity: 6: More than reasonable amount of time General    Pain Pain Assessment Pain Assessment: No/denies pain Pain Score: 0-No pain  Therapy/Group: Individual Therapy  Charlane Ferretti., CCC-SLP [phone]  Emira Eubanks 07/31/2011, 4:33 PM

## 2011-07-31 NOTE — Progress Notes (Signed)
Patient ID: [name], male   DOB: December 27, 1924, 78 y.o.   MRN: [id number] Subjective/Complaints: No new complaints- slept well Review of Systems  Neurological: Positive for dizziness and tremors.  All other systems reviewed and are negative.    Objective: Vital Signs: Blood pressure 118/69, pulse 76, temperature 98.4 F (36.9 C), temperature source Oral, resp. rate 18, height 5\' 7"  (1.702 m), weight 79.516 kg (175 lb 4.8 oz), SpO2 95.00%. No results found. No results found for this or any previous visit (from the past 72 hour(s)).   HEENT: normal Cardio: RRR Resp: CTA B/L GI: BS positive Extremity:  Pulses positive and No Edema Skin:   Intact Neuro: Alert/Oriented, Cranial Nerve II-XII normal, Normal Sensory, Normal Motor and Other ataxia on finger to nose as well as heel to shin Still ataxic decreased finger nose finger and heel/shin testing on Right, dysarthric, HOH  Assessment/Plan: 1. Functional deficits secondary to R superior and lateral cerebellar infarct which require 3+ hours per day of interdisciplinary therapy in a comprehensive inpatient rehab setting. Physiatrist is providing close team supervision and 24 hour management of active medical problems listed below. Physiatrist and rehab team continue to assess barriers to discharge/monitor patient progress toward functional and medical goals. FIM: FIM - Bathing Bathing Steps Patient Completed: Chest;Right Arm;Left Arm;Abdomen;Front perineal area;Buttocks;Right upper leg;Left upper leg;Right lower leg (including foot);Left lower leg (including foot) Bathing: 4: Steadying assist  FIM - Upper Body Dressing/Undressing Upper body dressing/undressing steps patient completed: Thread/unthread right sleeve of pullover shirt/dresss;Thread/unthread left sleeve of pullover shirt/dress;Put head through opening of pull over shirt/dress;Pull shirt over trunk Upper body dressing/undressing: 5: Set-up assist to: Obtain clothing/put  away FIM - Lower Body Dressing/Undressing Lower body dressing/undressing steps patient completed: Thread/unthread right pants leg;Thread/unthread left pants leg;Pull pants up/down;Don/Doff right shoe;Don/Doff left shoe;Don/Doff left sock Lower body dressing/undressing: 4: Min-Patient completed 75 plus % of tasks  FIM - Toileting Toileting steps completed by patient: Adjust clothing prior to toileting;Performs perineal hygiene;Adjust clothing after toileting Toileting: 4: Steadying assist  FIM - Diplomatic Services operational officer Devices: Grab bars Toilet Transfers: 4-To toilet/BSC: Min A (steadying Pt. > 75%);4-From toilet/BSC: Min A (steadying Pt. > 75%)  FIM - Bed/Chair Transfer Bed/Chair Transfer Assistive Devices: Therapist, occupational: 5: Sit > Supine: Supervision (verbal cues/safety issues);4: Bed > Chair or W/C: Min A (steadying Pt. > 75%)  FIM - Locomotion: Wheelchair Distance: 150 Locomotion: Wheelchair: 4: Travels 150 ft or more: maneuvers on rugs and over door sillls with minimal assistance (Pt.>75%) FIM - Locomotion: Ambulation Locomotion: Ambulation Assistive Devices: Consulting civil engineer (comment) (L hallway rail) Ambulation/Gait Assistance: 2: Max assist Locomotion: Ambulation: 2: Travels 50 - 149 ft with minimal assistance (Pt.>75%)  Comprehension Comprehension Mode: Auditory Comprehension: 4-Understands basic 75 - 89% of the time/requires cueing 10 - 24% of the time  Expression Expression Mode: Verbal Expression: 4-Expresses basic 75 - 89% of the time/requires cueing 10 - 24% of the time. Needs helper to occlude trach/needs to repeat words.  Social Interaction Social Interaction: 5-Interacts appropriately 90% of the time - Needs monitoring or encouragement for participation or interaction.  Problem Solving Problem Solving: 3-Solves basic 50 - 74% of the time/requires cueing 25 - 49% of the time  Memory Memory: 4-Recognizes or recalls 75 - 89% of the  time/requires cueing 10 - 24% of the time  Medical Problem List and Plan:  1. DVT Prophylaxis/Anticoagulation: Pharmaceutical: Lovenox  2. Pain Management: N/A  3. Mood: pleasant and appropriate.  4. HTN: monitor  with bid checks. Continue Norvasc and benazepril.  5. Dizziness: Vestibular eval and treat. Monitor for worsening of symptoms with increased mobility. Pt continues to work through. 6. Cdiff metronidazole colinization vs active infection symptoms-flagyl for 7 day course  -only one stool yesterday     LOS (Days) 7 A FACE TO FACE EVALUATION WAS PERFORMED  Rishab Stoudt E 07/31/2011, 6:52 AM

## 2011-07-31 NOTE — Progress Notes (Signed)
Per State Regulation 482.30 This chart was reviewed for medical necessity with respect to the patient's Admission/Duration of stay. Pt participating and slowly progressing toward goals. Presently at min assist overall, except max assist with ambulation. Incontinent of urine. Noted LBM 07/27/11, RN aware. Meryl Dare                 Nurse Care Manager            Next Review Date: 08/04/11

## 2011-07-31 NOTE — Progress Notes (Signed)
Physical Therapy Weekly Progress Note  Patient Details  Name: [name] MRN: [id number] Date of Birth: 18-Feb-1925  Today's Date: 08/01/2011 Time: session 1: 8:45-9:15 bed mobility supervision with hand rail. Pt was able to don shoes with increased time without LOB in sitting. Performed squat/ stand transfer with min assist inially with poor control and poor safety with decreased anterior weight shift and both knees locking into hyperextension. Transfer changed to squat pivot for increased safety and improved control of LEs with vc for hand placement and facilitation for normal movement with improved safety and knee and hip control. wc mobility x 200' with UEs controlled and home environment including on carpet, turns, and backing into parking space. Supervision with vc for body position in chair. Attempted to find back rest, but one was not available. [name], PTA  Session 2:  15:15-16:00 individual therapy:  Assisted pt to bathroom with min assist stand pivot transfer with grab bar with pt performing toileting task with mod assist to assist with brief only with vc for technique and foot position prior to standing. Gait training performed with rw x 50' forward with min assist, side steps to rt. X 40' with min assist becoming mod assist with turns and occasional tipping of the walker with mod vc for not moving the walker and his feet at the same time. Gait became mod assist with fatigue and decreased understanding of direction given by the therapist. Performed floor transfer from mat including transition to half kneel to work on weight shifts x 3 each way min assist, and squat transfers x 3 each way with facilitation for anterior weight shift and control.  [name], PTA   Patient has met 3 of 4 short term goals.  Pt currently is performing squat pivot transfers with min assist to facilitate forward weight shift and normal movement with vc for hand placement. wc mobility  Pt can ambulate with  rw with min- max  assist controlled environment, but lacks consistency and has heavy dependence on UE support for balance  Patient continues to demonstrate the following deficits: midline disorientation, impaired balance, limited mobility, incoordination RUE and RLE and therefore will continue to benefit from skilled PT intervention to enhance overall performance with balance, postural control, ability to compensate for deficits and awareness.  Patient progressing toward long term goals.  Continue plan of care.  PT Short Term Goals Week 1:  PT Short Term Goal 1 (Week 1): Pt will transfer bed>< w/c with min assist to L and R, consistently. PT Short Term Goal 2 (Week 1): Pt will propel w/c x 160' with supervision. PT Short Term Goal 3 (Week 1): Pt will self-correct midline orientation in static standing with use of visual feedback, x 1 minute, with mod assist. PT Short Term Goal 4 (Week 1): Pt will perform gait x 30' with mod assist of 1 person, safety stand-by assist of another person.   PT Short Term Goals Week 2:  PT Short Term Goal 1 (Week 2): Pt will perform sit > stand in preparation for ambulation, without LOB, with min assist. PT Short Term Goal 2 (Week 2): Pt will perform gait x 50' with min/mod assist consistently. PT Short Term Goal 3 (Week 2): Pt will ascend/descend 4 steps with 2 rails with mod assist. PT Short Term Goal 4 (Week 2): Pt will perform bil UE activity in standing x 3 minutes at counter height surface, without leaning against it.  Skilled Therapeutic Interventions/Progress Updates: Midline disorientation, RUE and  LE incoordination and ataxia, and significant hearing problems impact progress.     Therapy Documentation Precautions:  Precautions Precautions: Fall Precaution Comments: very HOH; no battery for hearing aid this AM Restrictions Weight Bearing Restrictions: No   Pain: Pain Assessment Pain Assessment: No/denies pain   Locomotion  : Ambulation Ambulation/Gait Assistance: 2: Max assist  Trunk/Postural Assessment : Backwards and R lean typically in standing    Balance: Dynamic Standing Balance Dynamic Standing - Balance Support: Bilateral upper extremity supported Dynamic Standing - Level of Assistance: 2: Max assist Dynamic Standing - Comments: leans to the right wtih fatigue     See FIM for current functional status  Therapy/Group: Individual Therapy  [name] 08/01/2011, 3:50 PM       Therapy Documentation Precautions:  Precautions Precautions: Fall Precaution Comments: very HOH; no battery for hearing aid this AM Restrictions Weight Bearing Restrictions: No  Pt often does not understand directions due to significant hearing loss.     Pain: Pain Assessment Pain Assessment: No/denies pain Mobility:   Locomotion : Ambulation Ambulation/Gait Assistance: 2: Max assist  Trunk/Postural Assessment :     Dynamic Standing Balance Dynamic Standing - Balance Support: Bilateral upper extremity supported Dynamic Standing - Level of Assistance: 2: Max assist Dynamic Standing - Comments: leans to the right wtih fatigue     See FIM for current functional status  Therapy/Group: Individual Therapy  [name] 08/01/2011, 3:48 PM

## 2011-08-01 NOTE — Progress Notes (Signed)
Physical Therapy Session Note  Patient Details  Name: [name] MRN: [id number] Date of Birth: [birth year]  Today's Date: 08/01/2011 Time:  0908- 1003 and 1350-1440 Time Calculation (min): 55 min and 50 min  Short Term Goals: Week 1:  PT Short Term Goal 1 (Week 1): Pt will transfer bed>< w/c with min assist to L and R, consistently. PT Short Term Goal 2 (Week 1): Pt will propel w/c x 160' with supervision. PT Short Term Goal 3 (Week 1): Pt will self-correct midline orientation in static standing with use of visual feedback, x 1 minute, with mod assist. PT Short Term Goal 4 (Week 1): Pt will perform gait x 30' with mod assist of 1 person, safety stand-by assist of another person. Week 2: see navigator     Skilled Therapeutic Interventions/Progress Updates:    AM-  Dynamic sitting balance with supervision to don shoes and socks with min assist.  Bed mobility on mat with VCs for neuro-re ed, to activate trunk and RLE  Squat pivot transfers to L with min A; to R inconsistent min-mod assistant.   W/c mobility with distant superivision to/from gym, using bil UEs for general strengthening//coordination of RUE.  In sitting with feet supported, neuro re-ed for trunk activation for righting reactions.  Pt demonstrated righting reaction with wt shift to R, but not L.  Pt had minimal trunk flexibility for trunk lengthening.  Gait training during transfer to L into w/c, x 5' with mod assist.  PM- squat pivot transfer as above, to mat.  Quadruped positioning for wt shifting to L during RUE elevation x 5, and wt shifting R><L during alternating hip extension , with mod assist to prevent R elbow from collapsing.    Gait training x 35' x 2 with mod to max assist for overshift to R, using RW.  Pt exhibits R hip adduction during swing phase with resulting narrow BOS.    Simulated car transfer squat pivot with min-mod assist.    Pt's hearing difficulties impact his ability to understand  directions.    Therapy Documentation Precautions:  Precautions Precautions: Fall Precaution Comments: very HOH; no battery for hearing aid this AM Restrictions Weight Bearing Restrictions: No     Pain: Pain Assessment Pain Assessment: No/denies pain Pain Score: 0-No pain Mobility: bed mobility with VCs for technique; quadruped position for neuro re-ed , with supervision   Locomotion : Ambulation Ambulation/Gait Assistance: 2: Max assist  Trunk/Postural Assessment : inconsistent WB'ing bil LEs with trunk lean to L at times, to R at times    Balance: Dynamic Standing Balance Dynamic Standing - Balance Support: Bilateral upper extremity supported Dynamic Standing - Level of Assistance: 2: Max assist Dynamic Standing - Comments: leans to the right wtih fatigue when ambulating Exercises: on Nut-Step, x 6 minutes, at level 4, rated 12 on BORG scale for generalized strengthening and increased trunk flexibility       See FIM for current functional status  Therapy/Group: Individual Therapy  Saliyah Gillin 08/01/2011, 2:38 PM

## 2011-08-01 NOTE — Progress Notes (Signed)
Patient ID: [name], male   DOB: June 21, 1924, 78 y.o.   MRN: [id number] Subjective/Complaints: No new complaints- slept well Review of Systems  Neurological: Positive for dizziness and tremors.  All other systems reviewed and are negative.    Objective: Vital Signs: Blood pressure 127/77, pulse 76, temperature 98.7 F (37.1 C), temperature source Oral, resp. rate 17, height 5\' 7"  (1.702 m), weight 79.516 kg (175 lb 4.8 oz), SpO2 99.00%. No results found. No results found for this or any previous visit (from the past 72 hour(s)).   HEENT: normal Cardio: RRR Resp: CTA B/L GI: BS positive Extremity:  Pulses positive and No Edema Skin:   Intact Neuro: Alert/Oriented, Cranial Nerve II-XII normal, Normal Sensory, Normal Motor and Other ataxia on finger to nose as well as heel to shin Still ataxic decreased finger nose finger and heel/shin testing on Right, dysarthric, HOH  Assessment/Plan: 1. Functional deficits secondary to R superior and lateral cerebellar infarct which require 3+ hours per day of interdisciplinary therapy in a comprehensive inpatient rehab setting. Physiatrist is providing close team supervision and 24 hour management of active medical problems listed below. Physiatrist and rehab team continue to assess barriers to discharge/monitor patient progress toward functional and medical goals. FIM: FIM - Bathing Bathing Steps Patient Completed: Chest;Right Arm;Left Arm;Abdomen;Front perineal area;Buttocks;Right upper leg;Left upper leg;Right lower leg (including foot);Left lower leg (including foot) Bathing: 4: Steadying assist (sit and stand in shower)  FIM - Upper Body Dressing/Undressing Upper body dressing/undressing steps patient completed: Thread/unthread right sleeve of pullover shirt/dresss;Thread/unthread left sleeve of pullover shirt/dress;Put head through opening of pull over shirt/dress;Pull shirt over trunk Upper body dressing/undressing: 5: Set-up assist  to: Obtain clothing/put away FIM - Lower Body Dressing/Undressing Lower body dressing/undressing steps patient completed: Thread/unthread right pants leg;Thread/unthread left pants leg;Pull pants up/down;Don/Doff right shoe;Don/Doff left shoe;Don/Doff left sock;Don/Doff right sock;Fasten/unfasten right shoe;Fasten/unfasten left shoe Lower body dressing/undressing: 4: Steadying Assist  FIM - Toileting Toileting steps completed by patient: Performs perineal hygiene;Adjust clothing after toileting;Adjust clothing prior to toileting Toileting Assistive Devices: Grab bar or rail for support Toileting: 4: Steadying assist  FIM - Diplomatic Services operational officer Devices: Grab bars Toilet Transfers: 4-From toilet/BSC: Min A (steadying Pt. > 75%);4-To toilet/BSC: Min A (steadying Pt. > 75%)  FIM - Bed/Chair Transfer Bed/Chair Transfer Assistive Devices: Therapist, occupational: 5: Supine > Sit: Supervision (verbal cues/safety issues);4: Bed > Chair or W/C: Min A (steadying Pt. > 75%);4: Chair or W/C > Bed: Min A (steadying Pt. > 75%)  FIM - Locomotion: Wheelchair Distance: 160' including home environment Locomotion: Wheelchair: 5: Travels 150 ft or more: maneuvers on rugs and over door sills with supervision, cueing or coaxing FIM - Locomotion: Ambulation Locomotion: Ambulation Assistive Devices: Designer, industrial/product Ambulation/Gait Assistance: 4: Min assist;3: Mod assist Locomotion: Ambulation: 2: Travels 50 - 149 ft with minimal assistance (Pt.>75%)  Comprehension Comprehension Mode: Auditory Comprehension: 4-Understands basic 75 - 89% of the time/requires cueing 10 - 24% of the time  Expression Expression Mode: Verbal Expression: 4-Expresses basic 75 - 89% of the time/requires cueing 10 - 24% of the time. Needs helper to occlude trach/needs to repeat words.  Social Interaction Social Interaction: 5-Interacts appropriately 90% of the time - Needs monitoring or encouragement for  participation or interaction.  Problem Solving Problem Solving: 3-Solves basic 50 - 74% of the time/requires cueing 25 - 49% of the time  Memory Memory: 4-Recognizes or recalls 75 - 89% of the time/requires cueing 10 - 24% of the  time  Medical Problem List and Plan:  1. DVT Prophylaxis/Anticoagulation: Pharmaceutical: Lovenox  2. Pain Management: N/A  3. Mood: pleasant and appropriate.  4. HTN: monitor with bid checks. Continue Norvasc and benazepril.  5. Dizziness: Vestibular eval and treat. Monitor for worsening of symptoms with increased mobility. Pt continues to work through. 6. Cdiff metronidazole colinization vs active infection symptoms-flagyl for 7 day course  -no recorded stool yesterday     LOS (Days) 8 A FACE TO FACE EVALUATION WAS PERFORMED  Zephyr Ridley E 08/01/2011, 6:44 AM

## 2011-08-01 NOTE — Progress Notes (Signed)
Speech Language Pathology Daily Session Note & Weekly Progress Update  Patient Details  Name: [name] MRN: [id number] Date of Birth: 1924-07-07  Today's Date: 08/01/2011 Time: 0835-0900 Time Calculation (min): 25 min  Short Term Goals: Week 1: SLP Short Term Goal 1 (Week 1): Patient will verbalize mildly complex needs/wants at the conversation level with mod verbal cues for self correction of verbal errors SLP Short Term Goal 2 (Week 1): Patient will utilize speech intelligibility strategies with min assist at the conversation level to improve intelligibility to 75%.  SLP Short Term Goal 3 (Week 1): Patient will demonstrate intact problem solving for moderately complex ADLs with moderate assist.  SLP Short Term Goal 4 (Week 1): Patient will demonstrate intellectual awareness of cognitive-linguistic and physical deficits during functional ADLs with mod assist.   Skilled Therapeutic Interventions: Session focused on skilled treatment of speech-language; SLP facilitated session with minimal assist semantic cues for patient to utilize slow and loud speech at phrase-sentence level expression with supervision semantic cues to self monitor naming errors (typically characterized by perseveration) and then was modified independent with correcting errors.  SLP suspects hearing status impact patient's ability to self monitor.  Patient required moderate assist semantic cues to recall current medications, which were discussed in yesterday's session.    Daily Session Precautions/Restrictions    FIM:  Comprehension Comprehension Mode: Auditory Comprehension: 5-Understands basic 90% of the time/requires cueing < 10% of the time Expression Expression Mode: Verbal Expression: 4-Expresses basic 75 - 89% of the time/requires cueing 10 - 24% of the time. Needs helper to occlude trach/needs to repeat words. Social Interaction Social Interaction: 5-Interacts appropriately 90% of the time - Needs  monitoring or encouragement for participation or interaction. Problem Solving Problem Solving: 3-Solves basic 50 - 74% of the time/requires cueing 25 - 49% of the time Memory Memory: 5-Recognizes or recalls 90% of the time/requires cueing < 10% of the time General    Pain Pain Assessment Pain Assessment: No/denies pain Pain Score: 0-No pain  Therapy/Group: Individual Therapy    Speech Language Pathology Weekly Progress Note  Patient Details  Name: [name] MRN: [id number] Date of Birth: 1924/11/07  Today's Date: 08/01/2011  Short Term Goals: Week 1: SLP Short Term Goal 1 (Week 1): Patient will verbalize mildly complex needs/wants at the conversation level with mod verbal cues for self correction of verbal errors SLP Short Term Goal 1 - Progress (Week 1): Met SLP Short Term Goal 2 (Week 1): Patient will utilize speech intelligibility strategies with min assist at the conversation level to improve intelligibility to 75%.  SLP Short Term Goal 2 - Progress (Week 1): Met SLP Short Term Goal 3 (Week 1): Patient will demonstrate intact problem solving for moderately complex ADLs with moderate assist.  SLP Short Term Goal 3 - Progress (Week 1): Met SLP Short Term Goal 4 (Week 1): Patient will demonstrate intellectual awareness of cognitive-linguistic and physical deficits during functional ADLs with mod assist.  Week 2: SLP Short Term Goal 1 (Week 2): Patient will verbalize mildly complex needs/wants at the conversation level with min verbal cues for self correction of verbal errors  SLP Short Term Goal 2 (Week 2): Patient will utilize speech intelligibility strategies with min assist at the conversation level to improve intelligibility to 85%. SLP Short Term Goal 3 (Week 2): Patient will demonstrate intact problem solving for moderately complex ADLs with min assist. SLP Short Term Goal 4 (Week 2): Patient will demonstrate emergent awareness of cognitive-linguistic and physical  deficits during functional ADLs with mod assist.  Weekly Progress Updates: Patient met 4 out of 4 short term goals this reporting period with functional gains in naming accuracy, speech intelligibility, awareness and functional problem solving with overall moderate assist.  Patient would continue to benefit from skilled SLP services to address goal at a minimal assist level to maximize functional independence and reduce burden of care upon discharge.    SLP Frequency: 1-2 X/day, 30-60 minutes;5 out of 7 days SLP Treatment/Interventions: Cognitive remediation/compensation;Cueing hierarchy;Environmental controls;Functional tasks;Internal/external aids;Medication managment;Patient/family education;Speech/Language facilitation;Therapeutic Activities;Therapeutic Exercise  Daily Session Precautions/Restrictions   HOH Cognition: Overall Cognitive Status: Impaired Arousal/Alertness: Awake/alert Orientation Level: Oriented X4 Attention: Selective Focused Attention: Appears intact Sustained Attention: Appears intact Selective Attention: Appears intact Memory: Impaired Memory Impairment: Decreased recall of new information;Retrieval deficit;Storage deficit Awareness: Impaired Awareness Impairment: Emergent impairment Problem Solving: Impaired Problem Solving Impairment: Verbal complex;Functional complex Executive Function: Self Monitoring Self Monitoring: Impaired Self Monitoring Impairment: Verbal complex Behaviors: Perseveration Safety/Judgment: Appears intact Oral/Motor: Oral Motor/Sensory Function Overall Oral Motor/Sensory Function: Appears within functional limits for tasks assessed Labial ROM: Within Functional Limits Labial Symmetry: Within Functional Limits Labial Strength: Reduced Labial Sensation: Within Functional Limits Lingual ROM: Within Functional Limits Lingual Symmetry: Within Functional Limits Lingual Strength: Reduced Lingual Sensation: Within Functional Limits Facial  ROM: Within Functional Limits Facial Symmetry: Within Functional Limits Facial Strength: Within Functional Limits Facial Sensation: Within Functional Limits Velum: Within Functional Limits Mandible: Within Functional Limits Motor Speech Overall Motor Speech: Impaired Respiration: Within functional limits Phonation: Normal Resonance: Within functional limits Articulation: Impaired Level of Impairment: Sentence Intelligibility: Intelligibility reduced Word: 75-100% accurate Phrase: 75-100% accurate Sentence: 50-74% accurate Conversation: 50-74% accurate Motor Planning: Witnin functional limits Motor Speech Errors: Not applicable Effective Techniques: Over-articulate Comprehension: Auditory Comprehension Overall Auditory Comprehension: Impaired Yes/No Questions: Within Functional Limits Commands: Impaired Two Step Basic Commands: 50-74% accurate Conversation: Simple Interfering Components: Hearing EffectiveTechniques: Extra processing time;Repetition Visual Recognition/Discrimination Discrimination: Within Function Limits Reading Comprehension Reading Status:  (pt. reports imparied at baseline) Word level: Impaired Sentence Level: Impaired Paragraph Level: Impaired Interfering Components:  (? origin) Effective Techniques: Verbal cueing Expression: Expression Primary Mode of Expression: Verbal Verbal Expression Overall Verbal Expression: Impaired Initiation: No impairment Automatic Speech: Name;Social Response Level of Generative/Spontaneous Verbalization: Conversation Repetition: No impairment Naming: Impairment Responsive: 76-100% accurate Confrontation: Within functional limits Convergent: 75-100% accurate Divergent: 75-100% accurate Other Naming Comments: errors occur in conversation and are compounded by dysarthria Verbal Errors: Not aware of errors Pragmatics: No impairment Interfering Components: Speech intelligibility;Other (comment) (hearing) Written  Expression Dominant Hand: Right Written Expression: Not tested  Fae Pippin, M.A., CCC-SLP [phone]  Ishmel Acevedo 08/01/2011, 11:15 AM

## 2011-08-01 NOTE — Progress Notes (Signed)
Occupational Therapy Skilled Therapy Intervention and Weekly Progress Note  Patient Details  Name: [name] MRN: [id number] Date of Birth: Jun 22, 1924  Today's Date: 08/01/2011 Time: 1000-1100 Time Calculation (min): 60 min  Patient has met 4 of 4 short term goals.    Patient continues to demonstrate the following deficits: decreased standing balance and RUE coordination and therefore will continue to benefit from skilled OT intervention to enhance overall performance with BADL and Reduce care partner burden.  Patient progressing toward long term goals..  Continue plan of care.  OT Short Term Goals Week 1:  OT Short Term Goal 1 (Week 1): Pt will perfrom LB bathing 2/3 sessions with min assist sit to stand. OT Short Term Goal 1 - Progress (Week 1): Met OT Short Term Goal 2 (Week 1): Pt will perfrom LB dressing with min assist sit to stand 2/3 sessions. OT Short Term Goal 2 - Progress (Week 1): Met OT Short Term Goal 3 (Week 1): Pt will transfer stand pivot to and from the 3:1 with min assist. OT Short Term Goal 3 - Progress (Week 1): Other (comment) (Met - pt uses a squat pivot) OT Short Term Goal 4 (Week 1): Pt will perform 2 grooming tasks in standing with min no more than min assist for balance. OT Short Term Goal 4 - Progress (Week 1): Met Week 2:  OT Short Term Goal 1 (Week 2): Pt will sit to stand to sink with supervision to increase independence with LB dressing. OT Short Term Goal 1 - Progress (Week 2): Progressing toward goal OT Short Term Goal 2 (Week 2): Pt will stand at sink with supervision with one hand for support to pull pants up to increase LB dressing skills. OT Short Term Goal 2 - Progress (Week 2): Progressing toward goal OT Short Term Goal 3 (Week 2): Pt will complete tub bench transfer in tub with min assist. OT Short Term Goal 3 - Progress (Week 2): Progressing toward goal  Skilled Therapeutic Interventions/Progress Updates:    Pt seen for BADL retraining of  bathing at shower level and dressing with a focus on sit to stand and standing balance.  Pt continues to use a squat pivot transfer to access shower from wheelchair with only steady assist with use of grab bars.  Pt is able to complete all self care with supervision, but needed assist to fully adjust pants over hips.  After completion of ADLs, pt worked on standing balance and tolerance and RUE coordination with checkers game.  Pt stood for 20 minutes with cues to not lean against table.  Continue with OT plan of care 60-90 min 5-7 days a week with Balance/vestibular training;Cognitive remediation/compensation;Discharge planning;DME/adaptive equipment instruction;Neuromuscular re-education;Functional mobility training;Patient/family education;Self Care/advanced ADL retraining;Therapeutic Activities;UE/LE Coordination activities;Therapeutic Exercise.   Therapy Documentation Precautions:  Precautions Precautions: Fall Precaution Comments: very HOH; no battery for hearing aid this AM Restrictions Weight Bearing Restrictions: No  Pain: Pain Assessment Pain Assessment: No/denies pain Pain Score: 0-No pain ADL: ADL Eating: Set up Where Assessed-Eating: Wheelchair Grooming: Supervision/safety Where Assessed-Grooming: Wheelchair Upper Body Bathing: Supervision/safety Where Assessed-Upper Body Bathing: Shower Lower Body Bathing: Supervision/safety Where Assessed-Lower Body Bathing: Shower Upper Body Dressing: Setup Where Assessed-Upper Body Dressing: Wheelchair Lower Body Dressing: Minimal assistance Where Assessed-Lower Body Dressing: Wheelchair Toileting: Minimal assistance Where Assessed-Toileting: Teacher, adult education: Curator Method: Ambulance person: Engineer, technical sales: Not assessed Tub/Shower Equipment: Transfer tub bench;Grab bars;Walk in shower Walk-In Shower Transfer: Minimal assistance  Walk-In Shower Transfer Method:  Squat pivot See FIM for current functional status  Therapy/Group: Individual Therapy  Delinda Malan 08/01/2011, 11:50 AM

## 2011-08-02 LAB — BASIC METABOLIC PANEL
BUN: 15 mg/dL (ref 6–23)
CO2: 29 mEq/L (ref 19–32)
Calcium: 10.8 mg/dL — ABNORMAL HIGH (ref 8.4–10.5)
Chloride: 105 mEq/L (ref 96–112)
Creatinine, Ser: 0.93 mg/dL (ref 0.50–1.35)
GFR calc Af Amer: 87 mL/min — ABNORMAL LOW (ref >90)
GFR calc non Af Amer: 75 mL/min — ABNORMAL LOW (ref >90)
Glucose, Bld: 124 mg/dL — ABNORMAL HIGH (ref 70–99)
Potassium: 4.4 mEq/L (ref 3.5–5.1)
Sodium: 142 mEq/L (ref 135–145)

## 2011-08-02 NOTE — Progress Notes (Signed)
Speech Language Pathology Daily Session Note  Patient Details  Name: [name] MRN: [id number] Date of Birth: April 07, 1924  Today's Date: 08/02/2011 Time: 4098-1191 Time Calculation (min): 45 min  Short Term Goals:  SLP Short Term Goal 1 (Week 2): Patient will verbalize mildly complex needs/wants at the conversation level with min verbal cues for self correction of verbal errors  SLP Short Term Goal 2 (Week 2): Patient will utilize speech intelligibility strategies with min assist at the conversation level to improve intelligibility to 85%. SLP Short Term Goal 3 (Week 2): Patient will demonstrate intact problem solving for moderately complex ADLs with min assist. SLP Short Term Goal 4 (Week 2): Patient will demonstrate emergent awareness of cognitive-linguistic and physical deficits during functional ADLs with mod assist.  Skilled Therapeutic Interventions: Treatment focus on functional communication and utilization of speech intelligibility strategies at the sentence and conversation level. Pt required Mod verbal and question cues to utilize a slow speech rate and to self-monitor and correct his rate during functional conversation. Pt demonstrated anticipatory awareness for discharge reporting he was unable to go home independently due to needing physical assistance. Pt also verbally demonstrated appropriate safety awareness for utilizing call bell to call for assistance.    Daily Session Precautions/Restrictions  Precautions Precautions: Fall FIM:  Comprehension Comprehension Mode: Auditory Comprehension: 5-Follows basic conversation/direction: With no assist Expression Expression Mode: Verbal Expression: 5-Expresses basic needs/ideas: With extra time/assistive device Social Interaction Social Interaction: 5-Interacts appropriately 90% of the time - Needs monitoring or encouragement for participation or interaction. Problem Solving Problem Solving: 5-Solves basic 90% of the  time/requires cueing < 10% of the time Memory Memory: 5-Recognizes or recalls 90% of the time/requires cueing < 10% of the time Pain Pain Assessment Pain Assessment: No/denies pain  Therapy/Group: Individual Therapy  Rafi Kenneth 08/02/2011, 2:11 PM

## 2011-08-02 NOTE — Progress Notes (Signed)
Patient ID: [name], male   DOB: 09/28/1924, 78 y.o.   MRN: [id number] Subjective/Complaints: No new complaints- slept well Review of Systems  Neurological: Positive for dizziness and tremors.  All other systems reviewed and are negative.    Objective: Vital Signs: Blood pressure 132/77, pulse 78, temperature 98.2 F (36.8 C), temperature source Oral, resp. rate 18, height 5\' 7"  (1.702 m), weight 79.516 kg (175 lb 4.8 oz), SpO2 95.00%. No results found. No results found for this or any previous visit (from the past 72 hour(s)).   HEENT: normal Cardio: RRR Resp: CTA B/L GI: BS positive Extremity:  Pulses positive and No Edema Skin:   Intact Neuro: Alert/Oriented, Cranial Nerve II-XII normal, Normal Sensory, Normal Motor and Other ataxia on finger to nose as well as heel to shin Still ataxic decreased finger nose finger and heel/shin testing on Right, dysarthric, HOH  Assessment/Plan: 1. Functional deficits secondary to R superior and lateral cerebellar infarct which require 3+ hours per day of interdisciplinary therapy in a comprehensive inpatient rehab setting. Physiatrist is providing close team supervision and 24 hour management of active medical problems listed below. Physiatrist and rehab team continue to assess barriers to discharge/monitor patient progress toward functional and medical goals. FIM: FIM - Bathing Bathing Steps Patient Completed: Right Arm;Left Arm;Abdomen;Front perineal area;Buttocks;Right lower leg (including foot);Chest;Right upper leg;Left upper leg;Left lower leg (including foot) Bathing: 5: Supervision: Safety issues/verbal cues  FIM - Upper Body Dressing/Undressing Upper body dressing/undressing steps patient completed: Thread/unthread right sleeve of pullover shirt/dresss;Thread/unthread left sleeve of pullover shirt/dress;Put head through opening of pull over shirt/dress;Pull shirt over trunk Upper body dressing/undressing: 5: Set-up assist to:  Obtain clothing/put away FIM - Lower Body Dressing/Undressing Lower body dressing/undressing steps patient completed: Thread/unthread right underwear leg;Thread/unthread left underwear leg;Thread/unthread right pants leg;Thread/unthread left pants leg;Don/Doff right sock;Don/Doff left sock;Don/Doff right shoe;Don/Doff left shoe;Fasten/unfasten right shoe;Fasten/unfasten left shoe (10 of 12 steps) Lower body dressing/undressing: 4: Min-Patient completed 75 plus % of tasks  FIM - Toileting Toileting steps completed by patient: Performs perineal hygiene;Adjust clothing after toileting;Adjust clothing prior to toileting Toileting Assistive Devices: Grab bar or rail for support Toileting: 4: Steadying assist  FIM - Diplomatic Services operational officer Devices: Grab bars Toilet Transfers: 4-From toilet/BSC: Min A (steadying Pt. > 75%);4-To toilet/BSC: Min A (steadying Pt. > 75%)  FIM - Bed/Chair Transfer Bed/Chair Transfer Assistive Devices: Therapist, occupational: 5: Supine > Sit: Supervision (verbal cues/safety issues);4: Bed > Chair or W/C: Min A (steadying Pt. > 75%);4: Chair or W/C > Bed: Min A (steadying Pt. > 75%)  FIM - Locomotion: Wheelchair Distance: 160' including home environment Locomotion: Wheelchair: 5: Travels 150 ft or more: maneuvers on rugs and over door sills with supervision, cueing or coaxing FIM - Locomotion: Ambulation Locomotion: Ambulation Assistive Devices: Designer, industrial/product Ambulation/Gait Assistance: 2: Max assist Locomotion: Ambulation: 1: Travels less than 50 ft with maximal assistance (Pt: 25 - 49%)  Comprehension Comprehension Mode: Auditory Comprehension: 5-Follows basic conversation/direction: With no assist  Expression Expression Mode: Verbal Expression: 4-Expresses basic 75 - 89% of the time/requires cueing 10 - 24% of the time. Needs helper to occlude trach/needs to repeat words.  Social Interaction Social Interaction: 5-Interacts  appropriately 90% of the time - Needs monitoring or encouragement for participation or interaction.  Problem Solving Problem Solving: 3-Solves basic 50 - 74% of the time/requires cueing 25 - 49% of the time  Memory Memory: 4-Recognizes or recalls 75 - 89% of the time/requires cueing 10 - 24%  of the time  Medical Problem List and Plan:  1. DVT Prophylaxis/Anticoagulation: Pharmaceutical: Lovenox  2. Pain Management: N/A  3. Mood: pleasant and appropriate.  4. HTN: monitor with bid checks. Continue Norvasc and benazepril.  5. Dizziness: Vestibular eval and treat. Monitor for worsening of symptoms with increased mobility. Pt continues to work through. 6. Cdiff metronidazole colinization vs active infection symptoms-flagyl completed  -re culture stool      LOS (Days) 9 A FACE TO FACE EVALUATION WAS PERFORMED  [name] 08/02/2011, 7:30 AM

## 2011-08-02 NOTE — Progress Notes (Signed)
Physical Therapy Session Note  Patient Details  Name: [name] MRN: [id number] Date of Birth: 03-09-1925  Today's Date: 08/02/2011 Time: 4098-1191 and 1130- 1203 Time Calculation (min): 40 min and 33 min  Short Term Goals: Week 2:  PT Short Term Goal 1 (Week 2): Pt will perform sit > stand in preparation for ambulation, without LOB, with min assist. PT Short Term Goal 2 (Week 2): Pt will perform gait x 50' with min/mod assist consistently. PT Short Term Goal 3 (Week 2): Pt will ascend/descend 4 steps with 2 rails with mod assist. PT Short Term Goal 4 (Week 2): Pt will perform bil UE activity in standing x 3 minutes at counter height surface, without leaning against it.  Skilled Therapeutic Interventions/Progress Updates: focus on neuro re-ed, functional mobility  1st treatment- Co- treatment with clinical specialist for neuro re-ed to facilitate midline orientation, transfers  Repetitive w/c>< mat squat pivot transfers to L and R, with manual cues for wt shifting, = wt bearing L and RLE, pivoting.  Pt progressed from mod/max to min assist to L and R, after multiple reps.  Useful VC for squat pivot is "stay low and slow".  2nd treatment- w/c> mat squat pivot transfer to R with min assist.  Sit> stand from mat with min assist x 3, facilitating forward wt shift with manual cues, min assist.  Standing activity at rolling table with LUE support during RUE card sorting activity, x 7 minutes, without LOB or significant posterior lean.  Gait training with weighted grocery cart, x 70' with min/mod assist for straight distances, but max assist during turns due to pt adducting RLE while overshifting upper trunk to R.     Therapy Documentation Precautions:  Precautions Precautions: Fall Precaution Comments: very HOH; no battery for hearing aid this AM Restrictions Weight Bearing Restrictions: No   Pain:       See FIM for current functional status  Therapy/Group: Individual  Therapy  Sacheen Arrasmith 08/02/2011, 4:00 PM

## 2011-08-02 NOTE — Progress Notes (Signed)
Occupational Therapy Session Note  Patient Details  Name: [name] MRN: [id number] Date of Birth: 23-Feb-1925  Today's Date: 08/02/2011 Time: 0930-1030 Time Calculation (min): 60 min  Short Term Goals: Week 1:  OT Short Term Goal 1 (Week 1): Pt will perfrom LB bathing 2/3 sessions with min assist sit to stand. OT Short Term Goal 1 - Progress (Week 1): Met OT Short Term Goal 2 (Week 1): Pt will perfrom LB dressing with min assist sit to stand 2/3 sessions. OT Short Term Goal 2 - Progress (Week 1): Met OT Short Term Goal 3 (Week 1): Pt will transfer stand pivot to and from the 3:1 with min assist. OT Short Term Goal 3 - Progress (Week 1): Other (comment) (Met - pt uses a squat pivot) OT Short Term Goal 4 (Week 1): Pt will perform 2 grooming tasks in standing with min no more than min assist for balance. OT Short Term Goal 4 - Progress (Week 1): Met Week 2:  OT Short Term Goal 1 (Week 2): Pt will sit to stand to sink with supervision to increase independence with LB dressing. OT Short Term Goal 1 - Progress (Week 2): Progressing toward goal OT Short Term Goal 2 (Week 2): Pt will stand at sink with supervision with one hand for support to pull pants up to increase LB dressing skills. OT Short Term Goal 2 - Progress (Week 2): Progressing toward goal OT Short Term Goal 3 (Week 2): Pt will complete tub bench transfer in tub with min assist. OT Short Term Goal 3 - Progress (Week 2): Progressing toward goal  Skilled Therapeutic Interventions/Progress Updates:   Pt seen for BADL retraining of toileting, bathing in shower, and dressing with a focus on squat pivot transfers, sit to stand, standing balance.  Pt needed min verbal and tactile cues to maintain a forward lean with sit to stand and stand to sit.  In standing, pt able to steady himself with one hand on sink to pull pants up for dressing and adjust clothing for toileting using grab bar.  In shower he had tendency to extend one leg into  hyperextension causing him to lean back.  With cues for proper foot placement, pt able to stand with steady assist.     Therapy Documentation Precautions:  Precautions Precautions: Fall Precaution Comments: very HOH; no battery for hearing aid this AM Restrictions Weight Bearing Restrictions: No   Pain: Pain Assessment Pain Assessment: No/denies pain See FIM for current functional status  Therapy/Group: Individual Therapy  [name] 08/02/2011, 11:47 AM

## 2011-08-02 NOTE — Care Management Note (Signed)
Met with pt to report on team conference. Pt in agreement with plan for d/c on 6/4. Talked with pt's daughter and explained goals and need for family education. She plans for herself and her 2 sons to come next week.

## 2011-08-02 NOTE — Patient Care Conference (Signed)
Inpatient RehabilitationTeam Conference Note Date: 08/02/2011   Time: 11:10 AM   Patient Name: [name]      Medical Record Number: [id number]  Date of Birth: 1924-12-26 Sex: Male         Room/Bed: 4030/4030-01 Payor Info: Payor: Advertising copywriter MEDICARE  Plan: [employer]  Product Type: *No Product type*     Admitting Diagnosis: R cerebellar CVA  Admit Date/Time:  07/24/2011  3:04 PM Admission Comments: No comment available   Primary Diagnosis:  CVA (cerebral infarction) Principal Problem: CVA (cerebral infarction)  Patient Active Problem List  Diagnoses Date Noted  . Hearing loss 07/20/2011  . Meningioma 07/20/2011  . HTN (hypertension) 07/19/2011  . CVA (cerebral infarction) 07/19/2011    Expected Discharge Date: Expected Discharge Date: 08/15/11  Team Members Present: Physician: Dr. Darl Householder, RN Case Manager Present: Lutricia Horsfall, RN Social Worker Present: Dossie Der, LCSW PT Present: Edman Circle, Lillie Columbia, PT OT Present: Bretta Bang, Verlene Mayer, OT SLP Present: Fae Pippin, SLP PPS Coordinator: Tora Duck, RN    Current Status/Progress Goal Weekly Team Focus  Medical   severe balance disorder, hard of hearing, limb ataxia on the right side, poor bladder awareness  continue therapy, improved urinary incontinence  time toileting, family training   Bowel/Bladder   occasional incontience; wears condom cath HS; timed toileting every three hours during the day; pt can be incontient of bowel at times  contient of bowel and bladder with minimal assist  timed toileting Q3 hrs    Swallow/Nutrition/ Hydration             ADL's   close supervision with bathing, UB dressing. min assist with shower stall transfer with grabbar; min assist LBdressing  supervision for selfcare and toileting, min assist with tub transfer  sit to stand, balance, ADL retraining, RUE coordination   Mobility   mod assist squat pivot basic and car  transfers; distant supervision w/c mobility on unit, mod-max assist gait x 35'  supervision basic transfers; min assist car transfers, gait x 50' and up/down 4 steps with 1 rail  midline orientation, neuro re-ed, mobility, balance   Communication   min assist with hearing aid and quiet environment  supervision   increase self monitoring   Safety/Cognition/ Behavioral Observations  min-mod assist  min assist  increase self monitoring and carryover   Pain   N/A- pt denies pain         Skin   N/A  no skin breakdown  monitor and assess skin and keep dry; turn Q3-4 hrs       *See Interdisciplinary Assessment and Plan and progress notes for long and short-term goals  Barriers to Discharge: see above    Possible Resolutions to Barriers:  see above    Discharge Planning/Teaching Needs:  To daughter's home where family to provide care-need to have come in for fmaily education      Team Discussion:  Pt with significant ataxia -- upper and lower body and midline disorientation. Needs squat-pivot transfer by all staff. Endurance fluctuates. Incontinent at times. Does not initiate use of call bell. Need strict timed toileting.HOH.  Revisions to Treatment Plan:  none   Continued Need for Acute Rehabilitation Level of Care: The patient requires daily medical management by a physician with specialized training in physical medicine and rehabilitation for the following conditions: Daily direction of a multidisciplinary physical rehabilitation program to ensure safe treatment while eliciting the highest outcome that is of practical value to  the patient.: Yes Daily medical management of patient stability for increased activity during participation in an intensive rehabilitation regime.: Yes Daily analysis of laboratory values and/or radiology reports with any subsequent need for medication adjustment of medical intervention for : Neurological problems  Meryl Dare 08/02/2011, 3:15 PM

## 2011-08-03 DIAGNOSIS — I6330 Cerebral infarction due to thrombosis of unspecified cerebral artery: Secondary | ICD-10-CM

## 2011-08-03 DIAGNOSIS — G8110 Spastic hemiplegia affecting unspecified side: Secondary | ICD-10-CM

## 2011-08-03 DIAGNOSIS — Z5189 Encounter for other specified aftercare: Secondary | ICD-10-CM

## 2011-08-03 NOTE — Progress Notes (Signed)
Physical Therapy Session Note  Patient Details  Name: AMADU SCHLAGETER MRN: [id number] Date of Birth: [birth year]  Today's Date: 08/03/2011 Time: 1914-7829 Time Calculation (min): 45 min  Short Term Goals: Week 2:  PT Short Term Goal 1 (Week 2): Pt will perform sit > stand in preparation for ambulation, without LOB, with min assist. PT Short Term Goal 2 (Week 2): Pt will perform gait x 50' with min/mod assist consistently. PT Short Term Goal 3 (Week 2): Pt will ascend/descend 4 steps with 2 rails with mod assist. PT Short Term Goal 4 (Week 2): Pt will perform bil UE activity in standing x 3 minutes at counter height surface, without leaning against it.  Skilled Therapeutic Interventions/Progress Updates:    NMR to promote postural control, balance in midline (correcting posterior and R lean) - repetitive sit to stands without UE use, standing weight shifting activities, stair climbing and gait- balance and control improved significantly as session progressed, tends to adduct RLE increasing LOB to R with gait, cues to correct this and to keep walker further out- controlled environment gait except turns which did increase unsteadiness  Therapy Documentation Precautions:  Precautions Precautions: Fall Precaution Comments: very HOH; no battery for hearing aid this AM Restrictions Weight Bearing Restrictions: No   Vital Signs:  96 with steps Pain:denies   Mobility:min A squat pivot or sit to stand   Locomotion : Ambulation Ambulation/Gait Assistance: 4: Min assist Ambulation Distance (Feet): 50 Feet Assistive device: Rolling walker Stairs / Additional Locomotion Stairs: Yes Stairs Assistance: 4: Min assist Stairs Assistance Details: Other (comment) (cues to bring RUE forward on rail) Stairs Assistance Details (indicate cue type and reason): increased posterior unsteadiness with R rail over 2 rails, pt chose step to pattern with 1 rail Stair Management Technique: One rail Right;Two  rails Number of Stairs: 10  (x 2) Height of Stairs: 6         Exercises: General Exercises - Lower Extremity Repetitive Sit to Stands: 10 reps;No upper extremities Other Treatments: Treatments Therapeutic Activity: weight shifting with RUE forward to increase weight over toes and then moving object to L hand and reaching to L to place it with min steady assist with no UE use for balance  See FIM for current functional status  Therapy/Group: Individual Therapy  Michaelene Song 08/03/2011, 1:39 PM

## 2011-08-03 NOTE — Progress Notes (Signed)
Patient ID: [name], male   DOB: 1925/02/23, 78 y.o.   MRN: [id number] Subjective/Complaints: No new complaints- slept well Review of Systems  Neurological: Positive for dizziness and tremors.  All other systems reviewed and are negative.    Objective: Vital Signs: Blood pressure 139/75, pulse 76, temperature 98 F (36.7 C), temperature source Oral, resp. rate 17, height 5\' 7"  (1.702 m), weight 79.516 kg (175 lb 4.8 oz), SpO2 96.00%. No results found. Results for orders placed during the hospital encounter of 07/24/11 (from the past 72 hour(s))  BASIC METABOLIC PANEL     Status: Abnormal   Collection Time   08/02/11 10:54 AM      Component Value Range Comment   Sodium 142  135 - 145 (mEq/L)    Potassium 4.4  3.5 - 5.1 (mEq/L)    Chloride 105  96 - 112 (mEq/L)    CO2 29  19 - 32 (mEq/L)    Glucose, Bld 124 (*) 70 - 99 (mg/dL)    BUN 15  6 - 23 (mg/dL)    Creatinine, Ser 4.09  0.50 - 1.35 (mg/dL)    Calcium 81.1 (*) 8.4 - 10.5 (mg/dL)    GFR calc non Af Amer 75 (*) >90 (mL/min)    GFR calc Af Amer 87 (*) >90 (mL/min)      HEENT: normal Cardio: RRR Resp: CTA B/L GI: BS positive Extremity:  Pulses positive and No Edema Skin:   Intact Neuro: Alert/Oriented, Cranial Nerve II-XII normal, Normal Sensory, Normal Motor and Other ataxia on finger to nose as well as heel to shin Still ataxic decreased finger nose finger and heel/shin testing on Right, dysarthric, HOH  Assessment/Plan: 1. Functional deficits secondary to R superior and lateral cerebellar infarct which require 3+ hours per day of interdisciplinary therapy in a comprehensive inpatient rehab setting. Physiatrist is providing close team supervision and 24 hour management of active medical problems listed below. Physiatrist and rehab team continue to assess barriers to discharge/monitor patient progress toward functional and medical goals. FIM: FIM - Bathing Bathing Steps Patient Completed: Chest;Right Arm;Left  Arm;Abdomen;Front perineal area;Buttocks;Right lower leg (including foot);Left upper leg;Right upper leg;Left lower leg (including foot) Bathing: 5: Supervision: Safety issues/verbal cues  FIM - Upper Body Dressing/Undressing Upper body dressing/undressing steps patient completed: Thread/unthread right sleeve of pullover shirt/dresss;Thread/unthread left sleeve of pullover shirt/dress;Put head through opening of pull over shirt/dress;Pull shirt over trunk Upper body dressing/undressing: 5: Set-up assist to: Obtain clothing/put away FIM - Lower Body Dressing/Undressing Lower body dressing/undressing steps patient completed: Thread/unthread right pants leg;Thread/unthread left pants leg;Pull pants up/down;Don/Doff right sock;Don/Doff left sock;Don/Doff right shoe;Don/Doff left shoe;Fasten/unfasten right shoe;Fasten/unfasten left shoe (9 out of 9 steps - uses brief) Lower body dressing/undressing: 4: Steadying Assist  FIM - Toileting Toileting steps completed by patient: Adjust clothing prior to toileting Toileting Assistive Devices: Grab bar or rail for support Toileting: 4: Steadying assist  FIM - Diplomatic Services operational officer Devices: Grab bars Toilet Transfers: 4-To toilet/BSC: Min A (steadying Pt. > 75%);4-From toilet/BSC: Min A (steadying Pt. > 75%)  FIM - Bed/Chair Transfer Bed/Chair Transfer Assistive Devices: Therapist, occupational: 3: Bed > Chair or W/C: Mod A (lift or lower assist);3: Chair or W/C > Bed: Mod A (lift or lower assist)  FIM - Locomotion: Wheelchair Distance: 160' including home environment Locomotion: Wheelchair: 5: Travels 150 ft or more: maneuvers on rugs and over door sills with supervision, cueing or coaxing FIM - Locomotion: Ambulation Locomotion: Ambulation Assistive Devices: Other (comment) (  weighted grocery cart) Ambulation/Gait Assistance: 2: Max assist Locomotion: Ambulation: 2: Travels 50 - 149 ft with maximal assistance (Pt: 25 -  49%)  Comprehension Comprehension Mode: Auditory Comprehension: 5-Follows basic conversation/direction: With no assist  Expression Expression Mode: Verbal Expression: 5-Expresses basic needs/ideas: With extra time/assistive device  Social Interaction Social Interaction: 5-Interacts appropriately 90% of the time - Needs monitoring or encouragement for participation or interaction.  Problem Solving Problem Solving: 5-Solves basic 90% of the time/requires cueing < 10% of the time  Memory Memory: 5-Recognizes or recalls 90% of the time/requires cueing < 10% of the time  Medical Problem List and Plan:  1. DVT Prophylaxis/Anticoagulation: Pharmaceutical: Lovenox  2. Pain Management: N/A  3. Mood: pleasant and appropriate.  4. HTN: monitor with bid checks. Continue Norvasc and benazepril.  5. Dizziness: Vestibular eval and treat. Monitor for worsening of symptoms with increased mobility. Pt continues to work through. 6. Cdiff metronidazole colinization vs active infection symptoms-flagyl completed  -re culture stool no BM since 5/21     LOS (Days) 10 A FACE TO FACE EVALUATION WAS PERFORMED  Nikoleta Dady E 08/03/2011, 7:18 AM

## 2011-08-03 NOTE — Progress Notes (Addendum)
Physical Therapy Session Note  Patient Details  Name: [name] MRN: [id number] Date of Birth: 11-16-1924  Today's Date: 08/03/2011 Time: 9562-1308 Time Calculation (min): 90 min  Short Term Goals: Week 2:  PT Short Term Goal 1 (Week 2): Pt will perform sit > stand in preparation for ambulation, without LOB, with min assist. PT Short Term Goal 2 (Week 2): Pt will perform gait x 50' with min/mod assist consistently. PT Short Term Goal 3 (Week 2): Pt will ascend/descend 4 steps with 2 rails with mod assist. PT Short Term Goal 4 (Week 2): Pt will perform bil UE activity in standing x 3 minutes at counter height surface, without leaning against it.  Skilled Therapeutic Interventions/Progress Updates: community outing to grocery store.    See goal sheet in shadow chart.    Pt participated in transfers w/c>< van seat (min assist to L; mod assist to R), w/c mobility in store, gait x 60' in store pushing grocery cart (min assist, mod assist for turns), use of R hand for retrieving and placing grocery items in cart, discussion of items that were purchased, menu planning for healthy meals, compensating for dysarthria/hearing loss by speaking loudly and slowly.  Pt achieved all goals that were set.     Therapy Documentation Precautions:  Precautions Precautions: Fall Precaution Comments: very HOH Restrictions Weight Bearing Restrictions: No  Pain: Pain Assessment Pain Assessment: No/denies pain   Locomotion : Ambulation Ambulation/Gait Assistance: 3: Mod assist  See FIM for current functional status  Therapy/Group: Individual Therapy  Dietrich Ke 08/03/2011, 3:17 PM

## 2011-08-03 NOTE — Progress Notes (Signed)
Speech Language Pathology Daily Session Note  Patient Details  Name: [name] MRN: [id number] Date of Birth: 1925/02/05  Today's Date: 08/03/2011 Time: 1410-1445 Time Calculation (min): 35 min  Short Term Goals: Week 2: SLP Short Term Goal 1 (Week 2): Patient will verbalize mildly complex needs/wants at the conversation level with min verbal cues for self correction of verbal errors  SLP Short Term Goal 2 (Week 2): Patient will utilize speech intelligibility strategies with min assist at the conversation level to improve intelligibility to 85%. SLP Short Term Goal 3 (Week 2): Patient will demonstrate intact problem solving for moderately complex ADLs with min assist. SLP Short Term Goal 4 (Week 2): Patient will demonstrate emergent awareness of cognitive-linguistic and physical deficits during functional ADLs with mod assist.  Skilled Therapeutic Interventions: Session focused on dysarthria treatment; SLP facilitated session with minimal assist semantic cues to utilize and self monitor verbal expression at the sentence to conversational level with 80% intelligibility.  Patient's rate of speech appears to have greatest impact on his intelligibility, as well as his decreased ability to self monitor as a result of his baseline hear status, which he reports having since his 30's or 40's.  Hearing status also impacted his ability to follow topic changes in conversation and he required occasional repeats/rephrases.   Daily Session Precautions/Restrictions    FIM:  Comprehension Comprehension Mode: Auditory Comprehension: 5-Follows basic conversation/direction: With extra time/assistive device Expression Expression Mode: Verbal Expression: 4-Expresses basic 75 - 89% of the time/requires cueing 10 - 24% of the time. Needs helper to occlude trach/needs to repeat words. Social Interaction Social Interaction: 6-Interacts appropriately with others with medication or extra time (anti-anxiety,  antidepressant). Problem Solving Problem Solving: 5-Solves basic 90% of the time/requires cueing < 10% of the time Memory Memory: 5-Recognizes or recalls 90% of the time/requires cueing < 10% of the time General  Amount of Missed SLP Time (min): 10 Minutes Pain Pain Assessment Pain Assessment: No/denies pain Pain Score: 0-No pain  Therapy/Group: Individual Therapy  Charlane Ferretti., CCC-SLP [phone]  Kaison Mcparland 08/03/2011, 5:09 PM

## 2011-08-03 NOTE — Progress Notes (Signed)
Occupational Therapy Session Note  Patient Details  Name: [name] MRN: [id number] Date of Birth: March 26, 1924  Today's Date: 08/03/2011 Time: 4098-1191 Time Calculation (min): 45 min  Short Term Goals: Week 1:  OT Short Term Goal 1 (Week 1): Pt will perfrom LB bathing 2/3 sessions with min assist sit to stand. OT Short Term Goal 1 - Progress (Week 1): Met OT Short Term Goal 2 (Week 1): Pt will perfrom LB dressing with min assist sit to stand 2/3 sessions. OT Short Term Goal 2 - Progress (Week 1): Met OT Short Term Goal 3 (Week 1): Pt will transfer stand pivot to and from the 3:1 with min assist. OT Short Term Goal 3 - Progress (Week 1): Other (comment) (Met - pt uses a squat pivot) OT Short Term Goal 4 (Week 1): Pt will perform 2 grooming tasks in standing with min no more than min assist for balance. OT Short Term Goal 4 - Progress (Week 1): Met Week 2:  OT Short Term Goal 1 (Week 2): Pt will sit to stand to sink with supervision to increase independence with LB dressing. OT Short Term Goal 1 - Progress (Week 2): Progressing toward goal OT Short Term Goal 2 (Week 2): Pt will stand at sink with supervision with one hand for support to pull pants up to increase LB dressing skills. OT Short Term Goal 2 - Progress (Week 2): Progressing toward goal OT Short Term Goal 3 (Week 2): Pt will complete tub bench transfer in tub with min assist. OT Short Term Goal 3 - Progress (Week 2): Progressing toward goal  Skilled Therapeutic Interventions/Progress Updates:   Pt seen for BADL retraining of bathing at shower level and dressing with a focus on squat pivot transfers and sit to stand and standing in shower and at sink with LUE support only.  Pt did all tasks with supervision with verbal cues to position feet, keep knees slightly flexed in standing. Getting out of shower pt stood and completed a stand pivot with min assist to steady balance.     Therapy Documentation Precautions:   Precautions Precautions: Fall Precaution Comments: very HOH; no battery for hearing aid this AM Restrictions Weight Bearing Restrictions: No Pain: Pain Assessment Pain Assessment: No/denies pain Pain Score: 0-No pain ADL: See FIM for current functional status  Therapy/Group: Individual Therapy  Diem Pagnotta 08/03/2011, 11:39 AM

## 2011-08-04 DIAGNOSIS — I6330 Cerebral infarction due to thrombosis of unspecified cerebral artery: Secondary | ICD-10-CM

## 2011-08-04 DIAGNOSIS — Z5189 Encounter for other specified aftercare: Secondary | ICD-10-CM

## 2011-08-04 DIAGNOSIS — G8110 Spastic hemiplegia affecting unspecified side: Secondary | ICD-10-CM

## 2011-08-04 NOTE — Progress Notes (Signed)
Social Work Patient ID: [name], male   DOB: January 16, 1925, 78 y.o.   MRN: [id number] Spoke with daughter to discuss discharge needs.  She and son's plan to come in next week To observe pt in therapies.  They are definitely taking pt home at discharge.  She has applied for Medicaid To assist with hospital bills and follow up care.  Pt very pleased with his progress this week.

## 2011-08-04 NOTE — Progress Notes (Signed)
Per State Regulation 482.30 This chart was reviewed for medical necessity with respect to the patient's Admission/Duration of stay. Pt continues to participate and progress in therapies. Incontinent of bladder and bowel at times. Working on timed toileting.  Meryl Dare                 Nurse Care Manager            Next Review Date: 08/07/11

## 2011-08-04 NOTE — Progress Notes (Signed)
Physical Therapy Note  Patient Details  Name: [name] MRN: [id number] Date of Birth: 08-Sep-1924 Today's Date: 08/04/2011  9:20-10:05 individual therapy pt denied pain.  Performed 10 sit to stands with hands only balanced on table in front of him to promote forward weight shift with vc for foot placement. performed step ups to 12" step with rt. Leg into abduction for strengthening with facilitation at left hip to promote hip extension and proper alignment of hip and trunk x 15, and step ups side ways on 8" step with lifting rt leg for abductor strengthening to aid rt hip control with gait. Pt able to ambulate x 120' with rw and min assist pt started to have decreased step through and rt. Foot was catching with fatigue, but decreased sizzoring and lateral hip instability. Discussed gait technique and assistance level with OT so she could initiate ambulating in her sessions.   Julian Reil 08/04/2011, 11:42 AM

## 2011-08-04 NOTE — Progress Notes (Signed)
Occupational Therapy Session Note  Patient Details  Name: [name] MRN: [id number] Date of Birth: 1925-03-05  Today's Date: 08/04/2011 Time: 0800-0855 Time Calculation (min): 55 min  Short Term Goals: Week 1:  OT Short Term Goal 1 (Week 1): Pt will perfrom LB bathing 2/3 sessions with min assist sit to stand. OT Short Term Goal 1 - Progress (Week 1): Met OT Short Term Goal 2 (Week 1): Pt will perfrom LB dressing with min assist sit to stand 2/3 sessions. OT Short Term Goal 2 - Progress (Week 1): Met OT Short Term Goal 3 (Week 1): Pt will transfer stand pivot to and from the 3:1 with min assist. OT Short Term Goal 3 - Progress (Week 1): Other (comment) (Met - pt uses a squat pivot) OT Short Term Goal 4 (Week 1): Pt will perform 2 grooming tasks in standing with min no more than min assist for balance. OT Short Term Goal 4 - Progress (Week 1): Met Week 2:  OT Short Term Goal 1 (Week 2): Pt will sit to stand to sink with supervision to increase independence with LB dressing. OT Short Term Goal 1 - Progress (Week 2): Progressing toward goal OT Short Term Goal 2 (Week 2): Pt will stand at sink with supervision with one hand for support to pull pants up to increase LB dressing skills. OT Short Term Goal 2 - Progress (Week 2): Progressing toward goal OT Short Term Goal 3 (Week 2): Pt will complete tub bench transfer in tub with min assist. OT Short Term Goal 3 - Progress (Week 2): Progressing toward goal     Skilled Therapeutic Interventions/Progress Updates:      Pt seen for BADL retraining of toileting, bathing, and dressing with a focus on sit to stand, standing balance, and functional transfers.  In therapy, we have been encouraging squat pivot transfers for increased motor control.  This morning pt wanted to stand pivot out of bed with his walker and stand pivot to toilet with grab bar, in which he performed the transfers very well demonstrating good motor control and balance with only  steady assist. Pt toileted in his room. Pt completed bathing and dressing in ADL apartment to use tub with tub bench.  Completed squat pivot to bench with supervision.  Pt needed less verbal cuing and only supervision standing in shower with grab bars to wash bottom and standing at sink to pull pants up. Pt continues to require brief.  Therapy Documentation Precautions:  Precautions Precautions: Fall Precaution Comments: very HOH; no battery for hearing aid this AM Restrictions Weight Bearing Restrictions: No  Pain: Pain Assessment Pain Assessment: No/denies pain  See FIM for current functional status  Therapy/Group: Individual Therapy  Kaianna Dolezal 08/04/2011, 9:10 AM

## 2011-08-04 NOTE — Progress Notes (Signed)
Physical Therapy Note  Patient Details  Name: [name] MRN: [id number] Date of Birth: Apr 03, 1924 Today's Date: 08/04/2011  15:20-16:20 individual therapy pt denied pain.  Performed stand transfer with mod assist due to leaning to rt. Practiced squat tranfers with initial facilitation for forward weight shift and trunk rotation to feel normal movement x 10, performed sit to stand x 30 with facilitation for forward weight shift and vc for foot placement. facilitation could be lightened with pt's ability to maintain normal movement, but was not able to perform with only vc. Pt tended not to weight shift forward and had posterior bias with initial standing.performed squating with rotation to place horseshoes on basket ball goal again with the need for facilitation to lean forward. As soon as tactile cues removed pt reverted back to not leaning forward at trunk and he locked rt knee.when reaching. Gait training at end of session x 150' with rw min assist including 1 turn each both ways with 1 LOB mod assist to correct. vc needed for increased step on rt when leg fatigued.  Pt is very sensitive to tactile cues.    Julian Reil 08/04/2011, 4:34 PM

## 2011-08-04 NOTE — Progress Notes (Signed)
Speech Language Pathology Daily Session Note  Patient Details  Name: [name] MRN: [id number] Date of Birth: [birth year]  Today's Date: 08/04/2011 Time: 1030-1100 Time Calculation (min): 30 min  Short Term Goals:  SLP Short Term Goal 1 (Week 2): Patient will verbalize mildly complex needs/wants at the conversation level with min verbal cues for self correction of verbal errors  SLP Short Term Goal 2 (Week 2): Patient will utilize speech intelligibility strategies with min assist at the conversation level to improve intelligibility to 85%. SLP Short Term Goal 3 (Week 2): Patient will demonstrate intact problem solving for moderately complex ADLs with min assist. SLP Short Term Goal 4 (Week 2): Patient will demonstrate emergent awareness of cognitive-linguistic and physical deficits during functional ADLs with mod assist.  Skilled Therapeutic Interventions: Session focused on dysarthria treatment; SLP facilitated session with minimal assist semantic cues to utilize and self monitor verbal expression at the sentence to conversational level with 80% intelligibility. Patient's rate of speech appears to have greatest impact on his intelligibility, as well as his decreased ability to self monitor as a result of his baseline hearing status.  Daily Session FIM:  Comprehension Comprehension Mode: Auditory Comprehension: 5-Follows basic conversation/direction: With extra time/assistive device Expression Expression: 4-Expresses basic 75 - 89% of the time/requires cueing 10 - 24% of the time. Needs helper to occlude trach/needs to repeat words. Social Interaction Social Interaction: 6-Interacts appropriately with others with medication or extra time (anti-anxiety, antidepressant). Problem Solving Problem Solving: 5-Solves basic 90% of the time/requires cueing < 10% of the time Memory Memory: 5-Recognizes or recalls 90% of the time/requires cueing < 10% of the time Pain Pain Assessment Pain  Assessment: No/denies pain Pain Score: 0-No pain Faces Pain Scale: No hurt  Therapy/Group: Individual Therapy  Janeice Stegall 08/04/2011, 11:06 AM

## 2011-08-04 NOTE — Progress Notes (Signed)
Patient ID: [name], male   DOB: 1924/05/29, 78 y.o.   MRN: [id number] Subjective/Complaints: No new complaints- slept well, 2 continent formed BMs yesterday Review of Systems  Neurological: Positive for dizziness and tremors.  All other systems reviewed and are negative.    Objective: Vital Signs: Blood pressure 141/91, pulse 101, temperature 98.7 F (37.1 C), temperature source Oral, resp. rate 18, height 5\' 7"  (1.702 m), weight 79.516 kg (175 lb 4.8 oz), SpO2 100.00%. No results found. Results for orders placed during the hospital encounter of 07/24/11 (from the past 72 hour(s))  BASIC METABOLIC PANEL     Status: Abnormal   Collection Time   08/02/11 10:54 AM      Component Value Range Comment   Sodium 142  135 - 145 (mEq/L)    Potassium 4.4  3.5 - 5.1 (mEq/L)    Chloride 105  96 - 112 (mEq/L)    CO2 29  19 - 32 (mEq/L)    Glucose, Bld 124 (*) 70 - 99 (mg/dL)    BUN 15  6 - 23 (mg/dL)    Creatinine, Ser 6.57  0.50 - 1.35 (mg/dL)    Calcium 84.6 (*) 8.4 - 10.5 (mg/dL)    GFR calc non Af Amer 75 (*) >90 (mL/min)    GFR calc Af Amer 87 (*) >90 (mL/min)      HEENT: normal Cardio: RRR Resp: CTA B/L GI: BS positive Extremity:  Pulses positive and No Edema Skin:   Intact Neuro: Alert/Oriented, Cranial Nerve II-XII normal, Normal Sensory, Normal Motor and Other ataxia on finger to nose as well as heel to shin Still ataxic decreased finger nose finger and heel/shin testing on Right, dysarthric, HOH  Assessment/Plan: 1. Functional deficits secondary to R superior and lateral cerebellar infarct which require 3+ hours per day of interdisciplinary therapy in a comprehensive inpatient rehab setting. Physiatrist is providing close team supervision and 24 hour management of active medical problems listed below. Physiatrist and rehab team continue to assess barriers to discharge/monitor patient progress toward functional and medical goals. FIM: FIM - Bathing Bathing Steps Patient  Completed: Chest;Right Arm;Left Arm;Abdomen;Front perineal area;Buttocks;Right lower leg (including foot);Left upper leg;Right upper leg;Left lower leg (including foot) Bathing: 5: Supervision: Safety issues/verbal cues  FIM - Upper Body Dressing/Undressing Upper body dressing/undressing steps patient completed: Thread/unthread right sleeve of pullover shirt/dresss;Thread/unthread left sleeve of pullover shirt/dress;Put head through opening of pull over shirt/dress;Pull shirt over trunk Upper body dressing/undressing: 5: Set-up assist to: Obtain clothing/put away FIM - Lower Body Dressing/Undressing Lower body dressing/undressing steps patient completed: Thread/unthread right pants leg;Thread/unthread left pants leg;Pull pants up/down;Don/Doff right sock;Don/Doff left sock;Don/Doff right shoe;Don/Doff left shoe;Fasten/unfasten right shoe;Fasten/unfasten left shoe (9 out of 9 steps - uses brief) Lower body dressing/undressing: 5: Supervision: Safety issues/verbal cues  FIM - Toileting Toileting steps completed by patient: Adjust clothing prior to toileting Toileting Assistive Devices: Grab bar or rail for support Toileting: 4: Steadying assist  FIM - Diplomatic Services operational officer Devices: Therapist, music Transfers: 0-Activity did not occur  FIM - Banker Devices: Therapist, occupational: 3: Bed > Chair or W/C: Mod A (lift or lower assist);3: Chair or W/C > Bed: Mod A (lift or lower assist)  FIM - Locomotion: Wheelchair Distance: 160' including home environment Locomotion: Wheelchair: 5: Travels 150 ft or more: maneuvers on rugs and over door sills with supervision, cueing or coaxing FIM - Locomotion: Ambulation Locomotion: Ambulation Assistive Devices:  (grocery cart in grocery store)  Ambulation/Gait Assistance: 3: Mod assist Locomotion: Ambulation: 2: Travels 50 - 149 ft with moderate assistance (Pt: 50 -  74%)  Comprehension Comprehension Mode: Auditory Comprehension: 5-Follows basic conversation/direction: With no assist  Expression Expression Mode: Verbal Expression: 4-Expresses basic 75 - 89% of the time/requires cueing 10 - 24% of the time. Needs helper to occlude trach/needs to repeat words.  Social Interaction Social Interaction: 6-Interacts appropriately with others with medication or extra time (anti-anxiety, antidepressant).  Problem Solving Problem Solving: 5-Solves complex 90% of the time/cues < 10% of the time  Memory Memory: 5-Recognizes or recalls 90% of the time/requires cueing < 10% of the time  Medical Problem List and Plan:  1. DVT Prophylaxis/Anticoagulation: Pharmaceutical: Lovenox  2. Pain Management: N/A  3. Mood: pleasant and appropriate.  4. HTN: monitor with bid checks. Continue Norvasc and benazepril.  5. Dizziness: Vestibular eval and treat. Monitor for worsening of symptoms with increased mobility. Pt continues to work through. 6. Cdiff metronidazole colinization vs active infection symptoms-flagyl completed  -re culture stool no BM since 5/21     LOS (Days) 11 A FACE TO FACE EVALUATION WAS PERFORMED  [name] 08/04/2011, 6:47 AM

## 2011-08-05 MED ORDER — BENAZEPRIL HCL 40 MG PO TABS
40.0000 mg | ORAL_TABLET | Freq: Every day | ORAL | Status: DC
  Administered 2011-08-06 – 2011-08-15 (×10): 40 mg via ORAL
  Filled 2011-08-05 (×11): qty 1

## 2011-08-05 NOTE — Progress Notes (Signed)
Patient ID: [name], male   DOB: 04-May-1924, 78 y.o.   MRN: [id number] Subjective/Complaints: Patient feels well today. No specific complaints. She denies diarrhea. ROS No specific complaints. Objective: Vital Signs: Blood pressure 146/80, pulse 78, temperature 98.3 F (36.8 C), temperature source Oral, resp. rate 18, height 5\' 7"  (1.702 m), weight 175 lb 4.8 oz (79.516 kg), SpO2 93.00%.  Elderly male in no acute distress. HEENT exam atraumatic, normocephalic symmetric her muscles are intact. Neck is supple. Chest clear to auscultation cardiac exam S1-S2 are regular. Extremities no edema.  Assessment/Plan: 1. Functional deficits secondary to R superior and lateral cerebellar infarct which require 3+ hours per day of interdisciplinary therapy in a comprehensive inpatient rehab setting. Physiatrist is providing close team supervision and 24 hour management of active medical problems listed below. Physiatrist and rehab team continue to assess barriers to discharge/monitor patient progress toward functional and medical goals. FIM: FIM - Bathing Bathing Steps Patient Completed: Chest;Right Arm;Left Arm;Abdomen;Front perineal area;Buttocks;Right lower leg (including foot);Left upper leg;Right upper leg;Left lower leg (including foot) Bathing: 5: Supervision: Safety issues/verbal cues  FIM - Upper Body Dressing/Undressing Upper body dressing/undressing steps patient completed: Thread/unthread right sleeve of pullover shirt/dresss;Thread/unthread left sleeve of pullover shirt/dress;Put head through opening of pull over shirt/dress;Pull shirt over trunk Upper body dressing/undressing: 5: Set-up assist to: Obtain clothing/put away FIM - Lower Body Dressing/Undressing Lower body dressing/undressing steps patient completed: Thread/unthread right pants leg;Thread/unthread left pants leg;Pull pants up/down;Don/Doff right sock;Don/Doff left sock;Don/Doff right shoe;Don/Doff left shoe;Fasten/unfasten  right shoe;Fasten/unfasten left shoe (9 out of 9 steps - uses brief) Lower body dressing/undressing: 5: Supervision: Safety issues/verbal cues  FIM - Toileting Toileting steps completed by patient: Adjust clothing prior to toileting;Performs perineal hygiene;Adjust clothing after toileting Toileting Assistive Devices: Grab bar or rail for support Toileting: 5: Supervision: Safety issues/verbal cues  FIM - Diplomatic Services operational officer Devices: Grab bars Toilet Transfers: 4-To toilet/BSC: Min A (steadying Pt. > 75%);4-From toilet/BSC: Min A (steadying Pt. > 75%)  FIM - Bed/Chair Transfer Bed/Chair Transfer Assistive Devices: Therapist, occupational: 5: Supine > Sit: Supervision (verbal cues/safety issues);4: Bed > Chair or W/C: Min A (steadying Pt. > 75%)  FIM - Locomotion: Wheelchair Distance: 160' including home environment Locomotion: Wheelchair: 5: Travels 150 ft or more: maneuvers on rugs and over door sills with supervision, cueing or coaxing FIM - Locomotion: Ambulation Locomotion: Ambulation Assistive Devices:  (grocery cart in grocery store) Ambulation/Gait Assistance: 3: Mod assist Locomotion: Ambulation: 2: Travels 50 - 149 ft with moderate assistance (Pt: 50 - 74%)  Comprehension Comprehension Mode: Auditory Comprehension: 5-Follows basic conversation/direction: With extra time/assistive device  Expression Expression Mode: Verbal Expression: 4-Expresses basic 75 - 89% of the time/requires cueing 10 - 24% of the time. Needs helper to occlude trach/needs to repeat words.  Social Interaction Social Interaction: 6-Interacts appropriately with others with medication or extra time (anti-anxiety, antidepressant).  Problem Solving Problem Solving: 5-Solves complex 90% of the time/cues < 10% of the time  Memory Memory: 5-Recognizes or recalls 90% of the time/requires cueing < 10% of the time  Medical Problem List and Plan:  1. DVT  Prophylaxis/Anticoagulation: Pharmaceutical: Lovenox  2. Pain Management: N/A  3. Mood: pleasant and appropriate.  4. HTN: monitor with bid checks. Continue Norvasc and benazepril. Will increase benazepril to 40 mg daily. 5. Dizziness: Vestibular eval and treat. Monitor for worsening of symptoms with increased mobility. Pt continues to work through. 6. Cdiff  he has completed course of metronidazole. He has no specific  complaints. No diarrhea.     LOS (Days) 12 A FACE TO FACE EVALUATION WAS PERFORMED  [name] 08/05/2011, 8:49 AM

## 2011-08-05 NOTE — Progress Notes (Signed)
Occupational Therapy Session Note  Patient Details  Name: [name] MRN: [id number] Date of Birth: August 16, 1924  Today's Date: 08/05/2011 Time: 1415-1500 Time Calculation (min): 45 min  Short Term Goals: Week 1:  OT Short Term Goal 1 (Week 1): Pt will perfrom LB bathing 2/3 sessions with min assist sit to stand. OT Short Term Goal 1 - Progress (Week 1): Met OT Short Term Goal 2 (Week 1): Pt will perfrom LB dressing with min assist sit to stand 2/3 sessions. OT Short Term Goal 2 - Progress (Week 1): Met OT Short Term Goal 3 (Week 1): Pt will transfer stand pivot to and from the 3:1 with min assist. OT Short Term Goal 3 - Progress (Week 1): Other (comment) (Met - pt uses a squat pivot) OT Short Term Goal 4 (Week 1): Pt will perform 2 grooming tasks in standing with min no more than min assist for balance. OT Short Term Goal 4 - Progress (Week 1): Met Week 2:  OT Short Term Goal 1 (Week 2): Pt will sit to stand to sink with supervision to increase independence with LB dressing. OT Short Term Goal 1 - Progress (Week 2): Progressing toward goal OT Short Term Goal 2 (Week 2): Pt will stand at sink with supervision with one hand for support to pull pants up to increase LB dressing skills. OT Short Term Goal 2 - Progress (Week 2): Progressing toward goal OT Short Term Goal 3 (Week 2): Pt will complete tub bench transfer in tub with min assist. OT Short Term Goal 3 - Progress (Week 2): Progressing toward goal      Skilled Therapeutic Interventions/Progress Updates:   Pt seen for neuromuscular re-ed with a focus on RLE motor control.  Pt ambulated to bathroom with mod assist with RW as he was significantly leaning to his right side.  Pt seen in gym to focus on sit to stand and standing balance activities with increased control of RLE.  Pt needed cues to bend knees and push hip back, but was able to sit to stand and stand to sit without UE support and close supervision. In standing, he worked on  side to side and forward reaching activities with and without UE support with close supervision to steady assist.     Therapy Documentation Precautions:  Precautions Precautions: Fall Precaution Comments: very HOH; Restrictions Weight Bearing Restrictions: No  Pain: Pain Assessment Pain Assessment: No/denies pain   See FIM for current functional status  Therapy/Group: Individual Therapy  Akshaya Toepfer 08/05/2011, 3:59 PM

## 2011-08-06 LAB — CLOSTRIDIUM DIFFICILE BY PCR: Toxigenic C. Difficile by PCR: NEGATIVE

## 2011-08-06 NOTE — Progress Notes (Signed)
Patient ID: [name], male   DOB: September 23, 1924, 78 y.o.   MRN: [id number] Subjective/Complaints: Patient feels well today. No specific complaints. he denies diarrhea. ROS No specific complaints. Objective: Vital Signs: Blood pressure 140/80, pulse 75, temperature 97.7 F (36.5 C), temperature source Oral, resp. rate 17, height 5\' 7"  (1.702 m), weight 175 lb 4.8 oz (79.516 kg), SpO2 94.00%.  Elderly male in no acute distress. HEENT exam atraumatic, normocephalic symmetric EO muscles are intact. Neck is supple. Chest clear to auscultation cardiac exam S1-S2 are regular. Extremities no edema.  Assessment/Plan: 1. Functional deficits secondary to R superior and lateral cerebellar infarct which require 3+ hours per day of interdisciplinary therapy in a comprehensive inpatient rehab setting. Physiatrist is providing close team supervision and 24 hour management of active medical problems listed below. Physiatrist and rehab team continue to assess barriers to discharge/monitor patient progress toward functional and medical goals. FIM: FIM - Bathing Bathing Steps Patient Completed: Chest;Left Arm;Abdomen;Front perineal area;Buttocks;Right upper leg;Left upper leg;Right lower leg (including foot) Bathing: 5: Supervision: Safety issues/verbal cues  FIM - Upper Body Dressing/Undressing Upper body dressing/undressing steps patient completed: Thread/unthread right sleeve of pullover shirt/dresss;Thread/unthread left sleeve of pullover shirt/dress;Pull shirt over trunk;Put head through opening of pull over shirt/dress Upper body dressing/undressing: 5: Set-up assist to: Obtain clothing/put away FIM - Lower Body Dressing/Undressing Lower body dressing/undressing steps patient completed: Pull underwear up/down;Pull pants up/down Lower body dressing/undressing: 5: Set-up assist to: Obtain clothing  FIM - Toileting Toileting steps completed by patient: Adjust clothing prior to toileting;Performs  perineal hygiene;Adjust clothing after toileting Toileting Assistive Devices: Grab bar or rail for support Toileting: 5: Supervision: Safety issues/verbal cues  FIM - Diplomatic Services operational officer Devices: Grab bars Toilet Transfers: 4-To toilet/BSC: Min A (steadying Pt. > 75%)  FIM - Bed/Chair Transfer Bed/Chair Transfer Assistive Devices: Therapist, occupational: 5: Supine > Sit: Supervision (verbal cues/safety issues);4: Bed > Chair or W/C: Min A (steadying Pt. > 75%)  FIM - Locomotion: Wheelchair Distance: 160' including home environment Locomotion: Wheelchair: 5: Travels 150 ft or more: maneuvers on rugs and over door sills with supervision, cueing or coaxing FIM - Locomotion: Ambulation Locomotion: Ambulation Assistive Devices:  (grocery cart in grocery store) Ambulation/Gait Assistance: 3: Mod assist Locomotion: Ambulation: 2: Travels 50 - 149 ft with moderate assistance (Pt: 50 - 74%)  Comprehension Comprehension Mode: Auditory Comprehension: 5-Follows basic conversation/direction: With extra time/assistive device  Expression Expression Mode: Verbal Expression: 5-Expresses basic 90% of the time/requires cueing < 10% of the time.  Social Interaction Social Interaction: 6-Interacts appropriately with others with medication or extra time (anti-anxiety, antidepressant).  Problem Solving Problem Solving: 5-Solves basic problems: With no assist  Memory Memory: 5-Recognizes or recalls 90% of the time/requires cueing < 10% of the time  Medical Problem List and Plan:  1. DVT Prophylaxis/Anticoagulation: Pharmaceutical: Lovenox  2. Pain Management: N/A  3. Mood: pleasant and appropriate.  4. HTN: monitor with bid checks. Continue Norvasc and benazepril. Will increase benazepril to 40 mg daily. 5. Dizziness: Vestibular eval and treat. Monitor for worsening of symptoms with increased mobility. Pt continues to work through. 6. Cdiff  he has completed course of  metronidazole. He has no specific complaints. No diarrhea.     LOS (Days) 13 A FACE TO FACE EVALUATION WAS PERFORMED  [name] 08/06/2011, 9:55 AM

## 2011-08-06 NOTE — Progress Notes (Signed)
Occupational Therapy Note  Patient Details  Name: [name] MRN: [id number] Date of Birth: 26-Feb-1925 Today's Date: 08/06/2011 Pain:  None Time:  1330-1415  ( ) Engaged in neuromuscular education to RUE with closed chain activities.  Addressed transfer with squat pivot, and sit to stand.with force use of RUE and RLE.  Performed RUE reaching and leaning forward to right.  Pt tolerated session well.     Humberto Seals 08/06/2011, 1:49 PM

## 2011-08-07 MED ORDER — DOCUSATE SODIUM 100 MG PO CAPS
100.0000 mg | ORAL_CAPSULE | Freq: Two times a day (BID) | ORAL | Status: DC
  Administered 2011-08-07 – 2011-08-15 (×17): 100 mg via ORAL
  Filled 2011-08-07 (×20): qty 1

## 2011-08-07 NOTE — Progress Notes (Signed)
Physical Therapy Note  Patient Details  Name: ALEXIUS HANGARTNER MRN: [id number] Date of Birth: Aug 26, 1924 Today's Date: 08/07/2011  1500-1545 (45 minutes) individual Pain: no complaint of pain Focus of treatment: Gait training focused on RT LE control during swing and stance maintaining hip width BOS and DF during swing.(changing directions) Treatment: Gait 73 feet X 2 RW min assist (straight course) with decreased adduction RT LE in stance and improved DF at heel strike ( 90%) . Gait around obstacles with RW- with loss of balance to right X 3 during turns or changing directions (decreased BOS- adducted RT LE). Gait forward/backward and sideways with RW min assist with decreased side step to right. Sit to stand //stand to sit with tactile cues to increased forward trunk flexion and to use armrests vs pulling up on AD.    Viki Carrera,JIM 08/07/2011, 2:52 PM

## 2011-08-07 NOTE — Progress Notes (Signed)
Speech Language Pathology Daily Session Note  Patient Details  Name: [name] MRN: [id number] Date of Birth: [birth year]  Today's Date: 08/07/2011 Time: 1418-1500 Time Calculation (min): 42 min  Short Term Goals: Week 2: SLP Short Term Goal 1 (Week 2): Patient will verbalize mildly complex needs/wants at the conversation level with min verbal cues for self correction of verbal errors  SLP Short Term Goal 2 (Week 2): Patient will utilize speech intelligibility strategies with min assist at the conversation level to improve intelligibility to 85%. SLP Short Term Goal 3 (Week 2): Patient will demonstrate intact problem solving for moderately complex ADLs with min assist. SLP Short Term Goal 4 (Week 2): Patient will demonstrate emergent awareness of cognitive-linguistic and physical deficits during functional ADLs with mod assist.  Skilled Therapeutic Interventions: Session focused on speech intelligibility; SLP facilitated session with mild environmental distractions and minimal assist semantic cues to utilize slow speech at the sentence-conversational level.  SLP also introduced a new card game to patient and he requested more information, clarification with minimal assist semantic cues.    Daily Session Precautions/Restrictions    FIM:  Comprehension Comprehension Mode: Auditory Comprehension: 5-Follows basic conversation/direction: With extra time/assistive device Expression Expression Mode: Verbal Expression: 5-Expresses basic needs/ideas: With extra time/assistive device Social Interaction Social Interaction: 6-Interacts appropriately with others with medication or extra time (anti-anxiety, antidepressant). Problem Solving Problem Solving: 5-Solves basic problems: With no assist Memory Memory: 5-Recognizes or recalls 90% of the time/requires cueing < 10% of the time General    Pain Pain Assessment Pain Assessment: No/denies pain Pain Score: 0-No pain  Therapy/Group:  Individual Therapy  Charlane Ferretti., CCC-SLP [phone]  [name] 08/07/2011, 3:16 PM

## 2011-08-07 NOTE — Progress Notes (Addendum)
Physical Therapy Note  Patient Details  Name: [name] MRN: [id number] Date of Birth: 07/03/1924 Today's Date: 08/07/2011  [id number] (55  Minutes) individual Pain: no complaint of pain Focus of treatment : Therapeutic activities to facilitate RT LE control during swing to stance /maintaining adequate BOS and ankle DF during swing Treatment: Transfer SPT RW min assist with vcs to maintain hip width BOS during turning; stepping forward and back X 10 steps with visual cues on floor to prevent adduction of RT LE during swing; standing holding ball performing mini squats, reaching to right, up, to left min assist with two episodes of uncorrected loss of balance; gait 60 feet RW min assist with pt maintaining adequate BOS when ambulating slowly and min assist to guide AD.Pt requires mod vcs for hand placement during sit><stand.       [name],JIM 08/07/2011, 9:40 AM

## 2011-08-07 NOTE — Progress Notes (Signed)
Occupational Therapy Session Note  Patient Details  Name: [name] MRN: [id number] Date of Birth: Jul 25, 1924  Today's Date: 08/07/2011 Time: 0800-0900 Time Calculation (min): 60 min  Short Term Goals: Week 1:  OT Short Term Goal 1 (Week 1): Pt will perfrom LB bathing 2/3 sessions with min assist sit to stand. OT Short Term Goal 1 - Progress (Week 1): Met OT Short Term Goal 2 (Week 1): Pt will perfrom LB dressing with min assist sit to stand 2/3 sessions. OT Short Term Goal 2 - Progress (Week 1): Met OT Short Term Goal 3 (Week 1): Pt will transfer stand pivot to and from the 3:1 with min assist. OT Short Term Goal 3 - Progress (Week 1): Other (comment) (Met - pt uses a squat pivot) OT Short Term Goal 4 (Week 1): Pt will perform 2 grooming tasks in standing with min no more than min assist for balance. OT Short Term Goal 4 - Progress (Week 1): Met Week 2:  OT Short Term Goal 1 (Week 2): Pt will sit to stand to sink with supervision to increase independence with LB dressing. OT Short Term Goal 1 - Progress (Week 2): Progressing toward goal OT Short Term Goal 2 (Week 2): Pt will stand at sink with supervision with one hand for support to pull pants up to increase LB dressing skills. OT Short Term Goal 2 - Progress (Week 2): Progressing toward goal OT Short Term Goal 3 (Week 2): Pt will complete tub bench transfer in tub with min assist. OT Short Term Goal 3 - Progress (Week 2): Progressing toward goal     Skilled Therapeutic Interventions/Progress Updates:  Pt seen for BADL retraining of bathing at shower level and dressing with a focus on transfers out of bed and to/from shower with RW versus stand pivot. Pt needs verbal/ tactile cues to position feet, lean forward with sit to stand and bear weight over forefoot versus heels in standing. Pt responds well to cues and only required min assist with transfer.  Pt worked on standing balance in shower with one UE supporting balance and at sink  without any UE support.  Pt had tendency to lean back and needed steady assist at times.  Overall with UE support he was able to manage his clothing with supervision.      Therapy Documentation Precautions:  Precautions Precautions: Fall Precaution Comments: very HOH;  Restrictions Weight Bearing Restrictions: No Pain: Pain Assessment Pain Assessment: No/denies pain See FIM for current functional status  Therapy/Group: Individual Therapy  Yanelly Cantrelle 08/07/2011, 9:08 AM

## 2011-08-07 NOTE — Progress Notes (Signed)
Per State Regulation 482.30 This chart was reviewed for medical necessity with respect to the patient's Admission/Duration of stay. Pt continues to participate and progress in therapies. Requires much cueing. BP meds adjusted.  Meryl Dare                 Nurse Care Manager            Next Review Date: 08/10/11

## 2011-08-07 NOTE — Progress Notes (Signed)
Patient ID: CEASER EBELING, male   DOB: 09/03/1924, 78 y.o.   MRN: [id number] Subjective/Complaints: No new complaints- slept well,no BM x 3 days.  Stool neg for C diff Review of Systems  Neurological: Positive for dizziness and tremors.  All other systems reviewed and are negative.    Objective: Vital Signs: Blood pressure 144/80, pulse 85, temperature 98 F (36.7 C), temperature source Oral, resp. rate 18, height 5\' 7"  (1.702 m), weight 79.516 kg (175 lb 4.8 oz), SpO2 96.00%. No results found. Results for orders placed during the hospital encounter of 07/24/11 (from the past 72 hour(s))  CLOSTRIDIUM DIFFICILE BY PCR     Status: Normal   Collection Time   08/06/11  3:32 PM      Component Value Range Comment   C difficile by pcr NEGATIVE  NEGATIVE       HEENT: normal Cardio: RRR Resp: CTA B/L GI: BS positive Extremity:  Pulses positive and No Edema Skin:   Intact Neuro: Alert/Oriented, Cranial Nerve II-XII normal, Normal Sensory, Normal Motor and Other ataxia on finger to nose as well as heel to shin Still ataxic decreased finger nose finger and heel/shin testing on Right, dysarthric, HOH  Assessment/Plan: 1. Functional deficits secondary to R superior and lateral cerebellar infarct which require 3+ hours per day of interdisciplinary therapy in a comprehensive inpatient rehab setting. Physiatrist is providing close team supervision and 24 hour management of active medical problems listed below. Physiatrist and rehab team continue to assess barriers to discharge/monitor patient progress toward functional and medical goals. FIM: FIM - Bathing Bathing Steps Patient Completed: Chest;Left Arm;Abdomen;Front perineal area;Buttocks;Right upper leg;Left upper leg;Right lower leg (including foot) Bathing: 5: Supervision: Safety issues/verbal cues  FIM - Upper Body Dressing/Undressing Upper body dressing/undressing steps patient completed: Thread/unthread right sleeve of pullover  shirt/dresss;Thread/unthread left sleeve of pullover shirt/dress;Pull shirt over trunk;Put head through opening of pull over shirt/dress Upper body dressing/undressing: 5: Set-up assist to: Obtain clothing/put away FIM - Lower Body Dressing/Undressing Lower body dressing/undressing steps patient completed: Pull underwear up/down;Pull pants up/down Lower body dressing/undressing: 5: Set-up assist to: Obtain clothing  FIM - Toileting Toileting steps completed by patient: Adjust clothing prior to toileting;Performs perineal hygiene;Adjust clothing after toileting Toileting Assistive Devices: Grab bar or rail for support Toileting: 5: Supervision: Safety issues/verbal cues  FIM - Diplomatic Services operational officer Devices: Grab bars Toilet Transfers: 4-To toilet/BSC: Min A (steadying Pt. > 75%)  FIM - Bed/Chair Transfer Bed/Chair Transfer Assistive Devices: Therapist, occupational: 7: Supine > Sit: No assist;4: Bed > Chair or W/C: Min A (steadying Pt. > 75%)  FIM - Locomotion: Wheelchair Distance: 160' including home environment Locomotion: Wheelchair: 5: Travels 150 ft or more: maneuvers on rugs and over door sills with supervision, cueing or coaxing FIM - Locomotion: Ambulation Locomotion: Health visitor Devices:  (grocery cart in grocery store) Ambulation/Gait Assistance: 3: Mod assist Locomotion: Ambulation: 2: Travels 50 - 149 ft with moderate assistance (Pt: 50 - 74%)  Comprehension Comprehension Mode: Auditory Comprehension: 5-Follows basic conversation/direction: With extra time/assistive device  Expression Expression Mode: Verbal Expression: 5-Expresses basic needs/ideas: With extra time/assistive device  Social Interaction Social Interaction: 6-Interacts appropriately with others with medication or extra time (anti-anxiety, antidepressant).  Problem Solving Problem Solving: 5-Solves basic problems: With no assist  Memory Memory: 5-Recognizes or recalls  90% of the time/requires cueing < 10% of the time  Medical Problem List and Plan:  1. DVT Prophylaxis/Anticoagulation: Pharmaceutical: Lovenox  2. Pain Management: N/A  3. Mood:  pleasant and appropriate.  4. HTN: monitor with bid checks. Continue Norvasc and benazepril.  5. Dizziness: Vestibular eval and treat. Monitor for worsening of symptoms with increased mobility. Pt continues to work through. 6. Cdiff metronidazole colinization vs active infection symptoms-flagyl completed  -constipated, stool neg for c diff 5/26, d/c prec     LOS (Days) 14 A FACE TO FACE EVALUATION WAS PERFORMED  Rilynn Habel E 08/07/2011, 8:18 AM

## 2011-08-08 DIAGNOSIS — G8110 Spastic hemiplegia affecting unspecified side: Secondary | ICD-10-CM

## 2011-08-08 DIAGNOSIS — Z5189 Encounter for other specified aftercare: Secondary | ICD-10-CM

## 2011-08-08 DIAGNOSIS — I6330 Cerebral infarction due to thrombosis of unspecified cerebral artery: Secondary | ICD-10-CM

## 2011-08-08 NOTE — Progress Notes (Signed)
Patient ID: [name], male   DOB: September 07, 1924, 78 y.o.   MRN: [id number] Subjective/Complaints: No new complaints- slept well,no BM x 3 days.  Stool neg for C diff Review of Systems  Neurological: Positive for dizziness and tremors.  All other systems reviewed and are negative.    Objective: Vital Signs: Blood pressure 145/85, pulse 73, temperature 98.7 F (37.1 C), temperature source Oral, resp. rate 17, height 5\' 7"  (1.702 m), weight 77.8 kg (171 lb 8.3 oz), SpO2 96.00%. No results found. Results for orders placed during the hospital encounter of 07/24/11 (from the past 72 hour(s))  CLOSTRIDIUM DIFFICILE BY PCR     Status: Normal   Collection Time   08/06/11  3:32 PM      Component Value Range Comment   C difficile by pcr NEGATIVE  NEGATIVE       HEENT: normal Cardio: RRR Resp: CTA B/L GI: BS positive Extremity:  Pulses positive and No Edema Skin:   Intact Neuro: Alert/Oriented, Cranial Nerve II-XII normal, Normal Sensory, Normal Motor and Other ataxia on finger to nose as well as heel to shin Still ataxic decreased finger nose finger and heel/shin testing on Right, dysarthric, HOH  Assessment/Plan: 1. Functional deficits secondary to R superior and lateral cerebellar infarct which require 3+ hours per day of interdisciplinary therapy in a comprehensive inpatient rehab setting. Physiatrist is providing close team supervision and 24 hour management of active medical problems listed below. Physiatrist and rehab team continue to assess barriers to discharge/monitor patient progress toward functional and medical goals. FIM: FIM - Bathing Bathing Steps Patient Completed: Chest;Right Arm;Left Arm;Abdomen;Front perineal area;Buttocks;Right upper leg;Left upper leg;Right lower leg (including foot);Left lower leg (including foot) Bathing: 5: Supervision: Safety issues/verbal cues  FIM - Upper Body Dressing/Undressing Upper body dressing/undressing steps patient completed:  Thread/unthread left sleeve of pullover shirt/dress;Pull shirt over trunk;Put head through opening of pull over shirt/dress;Thread/unthread right sleeve of pullover shirt/dresss Upper body dressing/undressing: 5: Supervision: Safety issues/verbal cues FIM - Lower Body Dressing/Undressing Lower body dressing/undressing steps patient completed: Thread/unthread right pants leg;Thread/unthread left pants leg;Pull pants up/down;Don/Doff right sock;Don/Doff left sock;Don/Doff right shoe;Don/Doff left shoe;Fasten/unfasten right shoe;Fasten/unfasten left shoe Lower body dressing/undressing: 5: Supervision: Safety issues/verbal cues  FIM - Toileting Toileting steps completed by patient: Adjust clothing prior to toileting;Performs perineal hygiene;Adjust clothing after toileting Toileting Assistive Devices: Grab bar or rail for support Toileting: 5: Supervision: Safety issues/verbal cues  FIM - Diplomatic Services operational officer Devices: Grab bars Toilet Transfers: 4-To toilet/BSC: Min A (steadying Pt. > 75%)  FIM - Bed/Chair Transfer Bed/Chair Transfer Assistive Devices: Therapist, occupational: 5: Supine > Sit: Supervision (verbal cues/safety issues);4: Bed > Chair or W/C: Min A (steadying Pt. > 75%)  FIM - Locomotion: Wheelchair Distance: 160' including home environment Locomotion: Wheelchair: 5: Travels 150 ft or more: maneuvers on rugs and over door sills with supervision, cueing or coaxing FIM - Locomotion: Ambulation Locomotion: Health visitor Devices:  (grocery cart in grocery store) Ambulation/Gait Assistance: 3: Mod assist Locomotion: Ambulation: 2: Travels 50 - 149 ft with moderate assistance (Pt: 50 - 74%)  Comprehension Comprehension Mode: Auditory Comprehension: 5-Follows basic conversation/direction: With extra time/assistive device  Expression Expression Mode: Verbal Expression: 5-Expresses basic needs/ideas: With extra time/assistive device  Social  Interaction Social Interaction: 6-Interacts appropriately with others with medication or extra time (anti-anxiety, antidepressant).  Problem Solving Problem Solving: 5-Solves basic 90% of the time/requires cueing < 10% of the time  Memory Memory: 5-Recognizes or recalls 90% of the time/requires  cueing < 10% of the time  Medical Problem List and Plan:  1. DVT Prophylaxis/Anticoagulation: Pharmaceutical: Lovenox  2. Pain Management: N/A  3. Mood: pleasant and appropriate.  4. HTN: monitor with bid checks. Continue Norvasc and benazepril.  5. Dizziness: Vestibular eval and treat. Monitor for worsening of symptoms with increased mobility. Pt continues to work through. 6. Cdiff metronidazole colinization vs active infection symptoms-flagyl completed  -constipated, stool neg for c diff 5/26, d/c prec     LOS (Days) 15 A FACE TO FACE EVALUATION WAS PERFORMED  Zailey Audia E 08/08/2011, 7:13 AM

## 2011-08-08 NOTE — Progress Notes (Signed)
Physical Therapy Weekly Progress Note and session Note  Patient Details  Name: [name] MRN: [id number] Date of Birth: 12/10/1924  Today's Date: 08/08/2011 Time: 4098-1191 and 1420-1450 Time Calculation (min): 60 min and 30 min Patient has met 3 of 4 short term goals.  Stairs goal not attempted.  Patient continues to demonstrate the following deficits: midline disorientation, RLE weakness, ataxia and decreased muscle grading  and therefore will continue to benefit from skilled PT intervention to enhance overall performance with balance, postural control, ability to compensate for deficits, functional use of  right upper extremity and right lower extremity and coordination.  Patient progressing toward long term goals. Long term gait goal modified to reflect pt's difficulty with turns. Continue plan of care.  PT Short Term Goals Week 2:  PT Short Term Goal 1 (Week 2): Pt will perform sit > stand in preparation for ambulation, without LOB, with min assist. Met PT Short Term Goal 2 (Week 2): Pt will perform gait x 50' with min/mod assist consistently. Met; min assist on straight path PT Short Term Goal 3 (Week 2): Pt will ascend/descend 4 steps with 2 rails with mod assist.  Not met PT Short Term Goal 4 (Week 2): Pt will perform bil UE activity in standing x 3 minutes at counter height surface, without leaning against it. Met  Week 3 STGs = LTGs    PT Short Term Goal 1 (Week 3): STGs = LTGs  Skilled Therapeutic Interventions/Progress Updates:   AM treatment- neuromuscular re-education via rolling and sit>< supine to activate RUE, RLE, trunk.  Pt required 1 cue to use RLE typically.  Squat pivot transfer to R with close supervision, with cue "low and slow".    Sit>< stand x 3, with LOB posteriorly 2/3, requiring min assist to bring COG over feet.   Standing activity at rolling table,  reaching R and L with R hand to promote wt shifting, x 3 minutes without LOB or leaning against  table.  Gait training with RW x 140' with min assist on straight course, mod assist for turn due to narrow BOS (RLE adduction), min assist x 145' straight course.  Focus on upright posture, forward gaze, R foot clearance when fatigued.  W/c mobility using bil UEs to propel x 150', bil LEs x 50',  x 2, to and from room to gym,  with modified independence.  PM treatment-   toilet transfer using wall bar and armrest of w/c, with min assist and min VCs for wt shifting forward.  Clothing management with assistance; toileting with supervision.  Stood at sink for hand washing x 3 minutes   Therapeutic exercise performed with bil LE to increase strength for functional mobility, on Nu Step x 12 minutes at level 4, rated 12 on Borg scale.  HR 65 and O2 sats 90% after ex.  Gait training x 25' with min assist until pt turned L with resulting LOB to R, requiring max assist to prevent fall. Pt's RLE fatigued after ex.  Pt too fatigued to attempt stairs.        Therapy Documentation Precautions:  Precautions Precautions: Fall Precaution Comments: very HOH; no battery for hearing aid this AM Restrictions Weight Bearing Restrictions: No Therapy Vitals Temp: 98.3 F (36.8 C) Temp src: Oral Pulse Rate: 70  Resp: 16  BP: 150/78 mmHg Patient Position, if appropriate: Sitting Oxygen Therapy SpO2: 98 % O2 Device: None (Room air) Pain:   AM and PM- none reported  See FIM for current functional status  Therapy/Group: Individual Therapy  Indea Dearman 08/08/2011, 3:53 PM

## 2011-08-08 NOTE — Care Management (Signed)
Portable Health Profile   Name: [name]       Date Initiated: 08/08/2011 DOB: 1924/09/11  Age: 78 y.o. Address: [address] [place] [address]  Phone:  [phone]  Emergency Contact   Contact Information    Name Relation Home Work Mobile   Rembert,[name] Daughter [phone]        Insurance    [place] [employer] [facility]  # [id number]      Allergies  No Known Allergies  Immunizations on file    Primary Care Provider  Dr Quitman Livings, MD [phone]  Other Physicians  Dr Claudette Laws  [phone]    Other Healthcare Providers/Preference    Hospital Preference    Diagnosis/Conditions from this Admission  Right CVA  Risk Factors/Limitations: vision, hearing, swallowing  Hard of hearing (right side)/has hearing aids  Special Instructions  24/7 supervision  Take your Portable Health Profile, discharge instructions and medication list to all medical appointments.  List changes/additions below

## 2011-08-08 NOTE — Progress Notes (Signed)
Speech Language Pathology Daily Session Note  Patient Details  Name: [name] MRN: [id number] Date of Birth: [birth year]  Today's Date: 08/08/2011 Time: 5027-7412 Time Calculation (min): 45 min  Short Term Goals: Week 2: SLP Short Term Goal 1 (Week 2): Patient will verbalize mildly complex needs/wants at the conversation level with min verbal cues for self correction of verbal errors  SLP Short Term Goal 2 (Week 2): Patient will utilize speech intelligibility strategies with min assist at the conversation level to improve intelligibility to 85%. SLP Short Term Goal 3 (Week 2): Patient will demonstrate intact problem solving for moderately complex ADLs with min assist. SLP Short Term Goal 4 (Week 2): Patient will demonstrate emergent awareness of cognitive-linguistic and physical deficits during functional ADLs with mod assist.  Skilled Therapeutic Interventions: Session focused on skilled treatment of speech intelligibility and cognition with regard to self monitoring; SLP facilitated session with increased volume and sitting on patient's left as well as increased wait time and repeats as needed.  Patient required minimal assist semantic cues to self monitor verbal express for intelligibility and accuracy of statement; for example, patient verbally repeated breakfast items 2-3 times.  SLP suspects hearing impairment impacts ability to self monitor intelligibility as well as accuracy of statements at times.    Daily Session Precautions/Restrictions    FIM:  Comprehension Comprehension Mode: Auditory Comprehension: 5-Follows basic conversation/direction: With extra time/assistive device Expression Expression Mode: Verbal Expression: 5-Expresses basic needs/ideas: With extra time/assistive device Social Interaction Social Interaction: 6-Interacts appropriately with others with medication or extra time (anti-anxiety, antidepressant). Problem Solving Problem Solving: 5-Solves basic 90%  of the time/requires cueing < 10% of the time Memory Memory: 5-Recognizes or recalls 90% of the time/requires cueing < 10% of the time General    Pain Pain Assessment Pain Assessment: No/denies pain Pain Score: 0-No pain  Therapy/Group: Individual Therapy  Charlane Ferretti., CCC-SLP [phone]  Melodi Happel 08/08/2011, 4:37 PM

## 2011-08-08 NOTE — Progress Notes (Signed)
Occupational Therapy Session Note  Patient Details  Name: [name] MRN: [id number] Date of Birth: 10-15-1924  Today's Date: 08/08/2011 Time: 0800-0900 Time Calculation (min): 60 min  Short Term Goals: Week 1:  OT Short Term Goal 1 (Week 1): Pt will perfrom LB bathing 2/3 sessions with min assist sit to stand. OT Short Term Goal 1 - Progress (Week 1): Met OT Short Term Goal 2 (Week 1): Pt will perfrom LB dressing with min assist sit to stand 2/3 sessions. OT Short Term Goal 2 - Progress (Week 1): Met OT Short Term Goal 3 (Week 1): Pt will transfer stand pivot to and from the 3:1 with min assist. OT Short Term Goal 3 - Progress (Week 1): Other (comment) (Met - pt uses a squat pivot) OT Short Term Goal 4 (Week 1): Pt will perform 2 grooming tasks in standing with min no more than min assist for balance. OT Short Term Goal 4 - Progress (Week 1): Met Week 2:  OT Short Term Goal 1 (Week 2): Pt will sit to stand to sink with supervision to increase independence with LB dressing. OT Short Term Goal 1 - Progress (Week 2): Progressing toward goal OT Short Term Goal 2 (Week 2): Pt will stand at sink with supervision with one hand for support to pull pants up to increase LB dressing skills. OT Short Term Goal 2 - Progress (Week 2): Progressing toward goal OT Short Term Goal 3 (Week 2): Pt will complete tub bench transfer in tub with min assist. OT Short Term Goal 3 - Progress (Week 2): Progressing toward goal     Skilled Therapeutic Interventions/Progress Updates: Pt seen for bathing at shower level and dressing with a focus on standing balance and transfers with RW.  Pt demonstrated much improved balance and was able to step/ stand pivot with RW with min assist.  When ADLs completed, pt worked on squat pivot transfers from wheelchair to arm chair numerous times.  Pt needed mod to max verbal cues first few times, then transferred with min verbal cues for foot/ hand placement.     Therapy  Documentation Precautions:  Precautions Precautions: Fall Precaution Comments: very HOH Restrictions Weight Bearing Restrictions: No    Pain: Pain Assessment Pain Assessment: No/denies pain   See FIM for current functional status  Therapy/Group: Individual Therapy  Chrystle Murillo 08/08/2011, 10:25 AM

## 2011-08-09 NOTE — Progress Notes (Addendum)
Speech Language Pathology Daily Session Note  Patient Details  Name: [name] MRN: [id number] Date of Birth: [birth year]  Today's Date: 08/09/2011 Time: 0932-1002 Time Calculation (min): 30 min  Short Term Goals: Week 2: SLP Short Term Goal 1 (Week 2): Patient will verbalize mildly complex needs/wants at the conversation level with min verbal cues for self correction of verbal errors  SLP Short Term Goal 2 (Week 2): Patient will utilize speech intelligibility strategies with min assist at the conversation level to improve intelligibility to 85%. SLP Short Term Goal 3 (Week 2): Patient will demonstrate intact problem solving for moderately complex ADLs with min assist. SLP Short Term Goal 4 (Week 2): Patient will demonstrate emergent awareness of cognitive-linguistic and physical deficits during functional ADLs with mod assist.  Skilled Therapeutic Interventions: Session was a co-treatment session with PT who facilitated safe mobility; SLP facilitated session with low, increased vocal intensity into left ear with mod assist repeats and demonstration cues used throughout session.  Patient was able to functionally complete basic cooking task with less cues when compared to when he was asked to verbally explain what he was doing.  Patient required moderate assist to functionally communicate due to speech intelligibility difficulty as well as word finding difficulty, but spontaneously utilized gestures to supplement his verbal expression.    Daily Session Precautions/Restrictions    FIM:  Comprehension Comprehension Mode: Auditory Comprehension: 5-Follows basic conversation/direction: With extra time/assistive device Expression Expression Mode: Verbal Expression: 5-Expresses basic needs/ideas: With extra time/assistive device Social Interaction Social Interaction: 6-Interacts appropriately with others with medication or extra time (anti-anxiety, antidepressant). Problem Solving Problem  Solving: 5-Solves complex 90% of the time/cues < 10% of the time Memory Memory: 5-Recognizes or recalls 90% of the time/requires cueing < 10% of the time General    Pain Pain Assessment Pain Assessment: No/denies pain Pain Score: 0-No pain  Therapy/Group: Individual Therapy  Charlane Ferretti., CCC-SLP [phone]  [name] 08/09/2011, 4:22 PM

## 2011-08-09 NOTE — Progress Notes (Signed)
Occupational Therapy Session Note  Patient Details  Name: [name] MRN: [id number] Date of Birth: [birth year]  Today's Date: 08/09/2011 Time: 0805-0905 Time Calculation (min): 60 min  Short Term Goals: Week 1:  OT Short Term Goal 1 (Week 1): Pt will perfrom LB bathing 2/3 sessions with min assist sit to stand. OT Short Term Goal 1 - Progress (Week 1): Met OT Short Term Goal 2 (Week 1): Pt will perfrom LB dressing with min assist sit to stand 2/3 sessions. OT Short Term Goal 2 - Progress (Week 1): Met OT Short Term Goal 3 (Week 1): Pt will transfer stand pivot to and from the 3:1 with min assist. OT Short Term Goal 3 - Progress (Week 1): Other (comment) (Met - pt uses a squat pivot) OT Short Term Goal 4 (Week 1): Pt will perform 2 grooming tasks in standing with min no more than min assist for balance. OT Short Term Goal 4 - Progress (Week 1): Met Week 2:  OT Short Term Goal 1 (Week 2): Pt will sit to stand to sink with supervision to increase independence with LB dressing. OT Short Term Goal 1 - Progress (Week 2): Progressing toward goal OT Short Term Goal 2 (Week 2): Pt will stand at sink with supervision with one hand for support to pull pants up to increase LB dressing skills. OT Short Term Goal 2 - Progress (Week 2): Progressing toward goal OT Short Term Goal 3 (Week 2): Pt will complete tub bench transfer in tub with min assist. OT Short Term Goal 3 - Progress (Week 2): Progressing toward goal  Skilled Therapeutic Interventions/Progress Updates:  Pt seen for BADL retraining of bathing at shower level, dressing, and grooming with a focus on functional mobility and standing balance.  Pt has been able to squat pivot with supervision, stand pivot with steady assist for the last few days, but needs more assist with ambulating to bathroom due to leaning back and to the right.  Today pt worked on controlled sit to stand from bed to step 5 feet to wheelchair, then controlled sit to stand  from wheelchair to ambulate 5 feet to shower stall.  Prior to ambulating pt worked on static standing balance to identify his center.  Today he was steady assist with those transfers with cues for foot placement and how to rotate walker.  Pt demonstrated improved control and coordination with his mobility.     Therapy Documentation Precautions:  Precautions Precautions: Fall Precaution Comments: very HOH Restrictions Weight Bearing Restrictions: No  Pain: Pain Assessment Pain Assessment: No/denies pain  See FIM for current functional status  Therapy/Group: Individual Therapy  [name] 08/09/2011, 10:15 AM

## 2011-08-09 NOTE — Progress Notes (Signed)
Speech Language Pathology Daily Session Note  Patient Details  Name: [name] MRN: [id number] Date of Birth: 10-13-1924  Today's Date: 08/09/2011 Time: 1430-1500 Time Calculation (min): 30 min  Short Term Goals: Week 2: SLP Short Term Goal 1 (Week 2): Patient will verbalize mildly complex needs/wants at the conversation level with min verbal cues for self correction of verbal errors  SLP Short Term Goal 2 (Week 2): Patient will utilize speech intelligibility strategies with min assist at the conversation level to improve intelligibility to 85%. SLP Short Term Goal 3 (Week 2): Patient will demonstrate intact problem solving for moderately complex ADLs with min assist. SLP Short Term Goal 4 (Week 2): Patient will demonstrate emergent awareness of cognitive-linguistic and physical deficits during functional ADLs with mod assist.  Skilled Therapeutic Interventions: Session focused on cognitive linguistic treatment; SLP facilitated session by requesting patient to notify daughter of room change with moderate assist semantic and tactile cues to problem solve dialing phone number and requesting to speak to his daughter.  It appeared that during his conversation with him the patient's daughter had difficulty understanding him because he repeated his room number several times.  SLP plans to follow up with daughter to discuss how different current speech is from premorbid speech due to the significance of his hearing loss.    Daily Session Precautions/Restrictions    FIM:  Comprehension Comprehension Mode: Auditory Comprehension: 5-Follows basic conversation/direction: With extra time/assistive device Expression Expression Mode: Verbal Expression: 5-Expresses basic needs/ideas: With extra time/assistive device Social Interaction Social Interaction: 6-Interacts appropriately with others with medication or extra time (anti-anxiety, antidepressant). Problem Solving Problem Solving: 5-Solves  complex 90% of the time/cues < 10% of the time Memory Memory: 5-Recognizes or recalls 90% of the time/requires cueing < 10% of the time General    Pain Pain Assessment Pain Assessment: No/denies pain Pain Score: 0-No pain  Therapy/Group: Individual Therapy  Charlane Ferretti., CCC-SLP [phone]  [name] 08/09/2011, 4:31 PM

## 2011-08-09 NOTE — Patient Care Conference (Signed)
Inpatient RehabilitationTeam Conference Note Date: 08/09/2011   Time: 11:00 AM    Patient Name: [name]      Medical Record Number: [id number]  Date of Birth: 07-Jan-1925 Sex: Male         Room/Bed: 4030/4030-01 Payor Info: Payor: Advertising copywriter MEDICARE  Plan: [employer]  Product Type: *No Product type*     Admitting Diagnosis: R cerebellar CVA  Admit Date/Time:  07/24/2011  3:04 PM Admission Comments: No comment available   Primary Diagnosis:  CVA (cerebral infarction) Principal Problem: CVA (cerebral infarction)  Patient Active Problem List  Diagnoses Date Noted  . Hearing loss 07/20/2011  . Meningioma 07/20/2011  . HTN (hypertension) 07/19/2011  . CVA (cerebral infarction) 07/19/2011    Expected Discharge Date: Expected Discharge Date: 08/15/11  Team Members Present: Physician: Dr. Claudette Laws Case Manager Present: Lutricia Horsfall, RN Social Worker Present: Dossie Der, LCSW Nurse Present: Carlean Purl, RN PT Present: Wanda Plump, PT OT Present: Bretta Bang, Verlene Mayer, OT SLP Present: Fae Pippin, SLP     Current Status/Progress Goal Weekly Team Focus  Medical   balance disorder persists, bowels are no longer loose  continue therapy as above  family training   Bowel/Bladder   continent with timed toileting; has initated needs last 2 days.     timed toileting   Swallow/Nutrition/ Hydration             ADL's   supervision with basic adls, min assist with transfers  supervision with BADLS and toilet transfer, min assist tub transfer  ADL transfers, standing balance, pt/ family education   Mobility   min/supervision basic transfers; modified independence w/c mobility on unit; min assist gait with RW x 140' straight path, mod-max assist on turns  no change  neuro re-ed, strengthening, balance, family ed   Communication   min-mod assist   supervision-min assist  increase self monitoring   Safety/Cognition/ Behavioral Observations  min-mod assist  min assist  increase self monitoring and carryover   Pain   denies     monitor   Skin   WNL     monitor      *See Interdisciplinary Assessment and Plan and progress notes for long and short-term goals  Barriers to Discharge: family has not trained yet    Possible Resolutions to Barriers:  identify caregivers and get them in for therapy sessions    Discharge Planning/Teaching Needs:  Home to daughter's home with 24 hour care-via daughter, grandson's and other daughter's.  Will come in for fmaily education      Team Discussion:  Continence continues to improve with timed toileting. Pt initiating more. Deficits with midline orientation and with balance. Discussed d/c plans. Need family to come in for education.  Revisions to Treatment Plan:  none   Continued Need for Acute Rehabilitation Level of Care: The patient requires daily medical management by a physician with specialized training in physical medicine and rehabilitation for the following conditions: Daily direction of a multidisciplinary physical rehabilitation program to ensure safe treatment while eliciting the highest outcome that is of practical value to the patient.: Yes Daily medical management of patient stability for increased activity during participation in an intensive rehabilitation regime.: Yes Daily analysis of laboratory values and/or radiology reports with any subsequent need for medication adjustment of medical intervention for : Neurological problems  Meryl Dare 08/09/2011, 12:08 PM

## 2011-08-09 NOTE — Progress Notes (Signed)
Physical Therapy Session Note  Patient Details  Name: [name] MRN: [id number] Date of Birth: 03-11-1925  Today's Date: 08/09/2011 Time: 1002-1032 Time Calculation (min): 30 min  Short Term Goals- same as LTGs    Skilled Therapeutic Interventions/Progress Updates: co-treat with SLP to focus on functional/cognitive activities requiring standing and walking.  SIt> stand repeatedly without LOB posteriorly, close supervision.    Standing at counter without leaning against it, with 1 UE support during cooking task, x 10 minutes.  Pt does not initiate stating when he is fatigued; he sighs, but does not indicate he needs to sit down and rest.    Side stepping L>< R at counter, without leaning on it, with min assist, cues to initiate movement of RLE before wt shifting to R.  Placing items back in refrigerator with mod assist for balance in standing.  Gait training on tile and carpet in home setting x 50' with min assist, including turns if cued beforehand for sequencing and wt shifting.    Dynamic sitting activity using bil hands for clothing task, cues to lean forward rather than backward.  Gait x 10' carrying 4 items over RW, balancing while hanging them up with RUE, LUE support on RW, min assist.     Therapy Documentation Precautions:  Precautions Precautions: Fall Precaution Comments: very HOH; no battery for hearing aid this AM Restrictions Weight Bearing Restrictions: No   Pain: Pain Assessment Pain Assessment: No/denies pain   Locomotion : Ambulation Ambulation/Gait Assistance: 4: Min assist      See FIM for current functional status  Therapy/Group: Individual Therapy  Nichols Corter 08/09/2011, 11:28 AM

## 2011-08-09 NOTE — Progress Notes (Signed)
Patient ID: [name], male   DOB: 06-01-1924, 78 y.o.   MRN: [id number] Subjective/Complaints: Bowels doing ok per pt Review of Systems  Neurological: Positive for dizziness and tremors.  All other systems reviewed and are negative.    Objective: Vital Signs: Blood pressure 133/81, pulse 74, temperature 98 F (36.7 C), temperature source Oral, resp. rate 18, height 5\' 7"  (1.702 m), weight 77.8 kg (171 lb 8.3 oz), SpO2 95.00%. No results found. Results for orders placed during the hospital encounter of 07/24/11 (from the past 72 hour(s))  CLOSTRIDIUM DIFFICILE BY PCR     Status: Normal   Collection Time   08/06/11  3:32 PM      Component Value Range Comment   C difficile by pcr NEGATIVE  NEGATIVE       HEENT: normal Cardio: RRR Resp: CTA B/L GI: BS positive Extremity:  Pulses positive and No Edema Skin:   Intact Neuro: Alert/Oriented, Cranial Nerve II-XII normal, Normal Sensory, Normal Motor and Other ataxia on finger to nose as well as heel to shin Still ataxic decreased finger nose finger and heel/shin testing on Right, dysarthric, HOH  Assessment/Plan: 1. Functional deficits secondary to R superior and lateral cerebellar infarct which require 3+ hours per day of interdisciplinary therapy in a comprehensive inpatient rehab setting. Physiatrist is providing close team supervision and 24 hour management of active medical problems listed below. Physiatrist and rehab team continue to assess barriers to discharge/monitor patient progress toward functional and medical goals. FIM: FIM - Bathing Bathing Steps Patient Completed: Chest;Right Arm;Left Arm;Abdomen;Front perineal area;Buttocks;Right upper leg;Left upper leg;Right lower leg (including foot);Left lower leg (including foot) Bathing: 5: Supervision: Safety issues/verbal cues  FIM - Upper Body Dressing/Undressing Upper body dressing/undressing steps patient completed: Thread/unthread left sleeve of pullover  shirt/dress;Pull shirt over trunk;Put head through opening of pull over shirt/dress;Thread/unthread right sleeve of pullover shirt/dresss Upper body dressing/undressing: 5: Supervision: Safety issues/verbal cues FIM - Lower Body Dressing/Undressing Lower body dressing/undressing steps patient completed: Thread/unthread right pants leg;Thread/unthread left pants leg;Pull pants up/down;Don/Doff right sock;Don/Doff left sock;Don/Doff right shoe;Don/Doff left shoe;Fasten/unfasten right shoe;Fasten/unfasten left shoe Lower body dressing/undressing: 5: Supervision: Safety issues/verbal cues  FIM - Toileting Toileting steps completed by patient: Adjust clothing prior to toileting;Performs perineal hygiene;Adjust clothing after toileting Toileting Assistive Devices: Grab bar or rail for support Toileting: 5: Supervision: Safety issues/verbal cues  FIM - Diplomatic Services operational officer Devices: Grab bars Toilet Transfers: 4-To toilet/BSC: Min A (steadying Pt. > 75%)  FIM - Bed/Chair Transfer Bed/Chair Transfer Assistive Devices: Therapist, occupational: 5: Chair or W/C > Bed: Supervision (verbal cues/safety issues)  FIM - Locomotion: Wheelchair Distance: 160' including home environment Locomotion: Wheelchair: 6: Travels 150 ft or more, turns around, maneuvers to table, bed or toilet, negotiates 3% grade: maneuvers on rugs and over door sills independently FIM - Locomotion: Ambulation Locomotion: Ambulation Assistive Devices: Designer, industrial/product Ambulation/Gait Assistance: 3: Mod assist Locomotion: Ambulation: 3: Travels 150 ft or more with moderate assistance (Pt: 50 - 74%)  Comprehension Comprehension Mode: Auditory Comprehension: 5-Follows basic conversation/direction: With extra time/assistive device  Expression Expression Mode: Verbal Expression: 5-Expresses basic needs/ideas: With extra time/assistive device  Social Interaction Social Interaction: 6-Interacts appropriately  with others with medication or extra time (anti-anxiety, antidepressant).  Problem Solving Problem Solving: 5-Solves basic 90% of the time/requires cueing < 10% of the time  Memory Memory: 5-Recognizes or recalls 90% of the time/requires cueing < 10% of the time  Medical Problem List and Plan:  1. DVT  Prophylaxis/Anticoagulation: Pharmaceutical: Lovenox  2. Pain Management: N/A  3. Mood: pleasant and appropriate.  4. HTN: monitor with bid checks. Continue Norvasc and benazepril.  5. Dizziness: Vestibular eval and treat. Monitor for worsening of symptoms with increased mobility. Pt continues to work through. 6. Cdiff metronidazole colinization vs active infection symptoms-flagyl completed  -constipated, stool neg for c diff 5/26, d/c prec     LOS (Days) 16 A FACE TO FACE EVALUATION WAS PERFORMED  [name] E 08/09/2011, 7:22 AM

## 2011-08-09 NOTE — Progress Notes (Signed)
Occupational Therapy Note  Patient Details  Name: [name] MRN: [id number] Date of Birth: Nov 02, 1924 Today's Date: 08/09/2011  Time:  1330-1415 Pt denies pain Individual Therapy  Pt engaged in functional ambulation activities and sit to stand activities from varying surface heights.  Pt completed all tasks with steady assist and light tactile cues to facilitate transitional movements.  With extra time pt initiates transitional movements with light tactile cues to facilitate.  Pt recalled correct BUE and LLE positioning in preparation for sit to stand and stand to sit.  Pt self corrects correct sequencing with extra time.  Lavone Neri [facility] 08/09/2011, 3:06 PM

## 2011-08-10 NOTE — Progress Notes (Signed)
Patient ID: [name], male   DOB: [birth year], 78 y.o.   MRN: [id number] Subjective/Complaints: No pain , no breathing problem Review of Systems  Neurological: Positive for dizziness and tremors.  All other systems reviewed and are negative.    Objective: Vital Signs: Blood pressure 140/78, pulse 86, temperature 97.4 F (36.3 C), temperature source Oral, resp. rate 18, height 5\' 7"  (1.702 m), weight 80.6 kg (177 lb 11.1 oz), SpO2 94.00%. No results found. No results found for this or any previous visit (from the past 72 hour(s)).   HEENT: normal Cardio: RRR Resp: CTA B/L GI: BS positive Extremity:  Pulses positive and No Edema Skin:   Intact Neuro: Alert/Oriented, Cranial Nerve II-XII normal, Normal Sensory, Normal Motor and Other ataxia on finger to nose as well as heel to shin Still ataxic decreased finger nose finger and heel/shin testing on Right, dysarthric, HOH  Assessment/Plan: 1. Functional deficits secondary to R superior and lateral cerebellar infarct which require 3+ hours per day of interdisciplinary therapy in a comprehensive inpatient rehab setting. Physiatrist is providing close team supervision and 24 hour management of active medical problems listed below. Physiatrist and rehab team continue to assess barriers to discharge/monitor patient progress toward functional and medical goals. FIM: FIM - Bathing Bathing Steps Patient Completed: Chest;Right Arm;Left Arm;Abdomen;Front perineal area;Buttocks;Right upper leg;Left upper leg;Right lower leg (including foot);Left lower leg (including foot) Bathing: 5: Supervision: Safety issues/verbal cues  FIM - Upper Body Dressing/Undressing Upper body dressing/undressing steps patient completed: Thread/unthread left sleeve of pullover shirt/dress;Pull shirt over trunk;Put head through opening of pull over shirt/dress;Thread/unthread right sleeve of pullover shirt/dresss Upper body dressing/undressing: 5: Supervision: Safety  issues/verbal cues FIM - Lower Body Dressing/Undressing Lower body dressing/undressing steps patient completed: Thread/unthread right pants leg;Thread/unthread left pants leg;Pull pants up/down;Don/Doff right sock;Don/Doff left sock;Don/Doff right shoe;Don/Doff left shoe;Fasten/unfasten right shoe;Fasten/unfasten left shoe Lower body dressing/undressing: 5: Supervision: Safety issues/verbal cues  FIM - Toileting Toileting steps completed by patient: Adjust clothing prior to toileting;Performs perineal hygiene;Adjust clothing after toileting Toileting Assistive Devices: Grab bar or rail for support Toileting: 5: Supervision: Safety issues/verbal cues  FIM - Diplomatic Services operational officer Devices: Grab bars Toilet Transfers: 4-To toilet/BSC: Min A (steadying Pt. > 75%)  FIM - Bed/Chair Transfer Bed/Chair Transfer Assistive Devices: Therapist, occupational: 5: Supine > Sit: Supervision (verbal cues/safety issues);4: Bed > Chair or W/C: Min A (steadying Pt. > 75%)  FIM - Locomotion: Wheelchair Distance: 160' including home environment Locomotion: Wheelchair: 0: Activity did not occur FIM - Locomotion: Ambulation Locomotion: Ambulation Assistive Devices: Designer, industrial/product Ambulation/Gait Assistance: 4: Min assist Locomotion: Ambulation: 1: Travels less than 50 ft with minimal assistance (Pt.>75%)  Comprehension Comprehension Mode: Auditory Comprehension: 5-Follows basic conversation/direction: With no assist  Expression Expression Mode: Verbal Expression: 5-Expresses basic needs/ideas: With extra time/assistive device  Social Interaction Social Interaction: 6-Interacts appropriately with others with medication or extra time (anti-anxiety, antidepressant).  Problem Solving Problem Solving: 5-Solves basic 90% of the time/requires cueing < 10% of the time  Memory Memory: 5-Recognizes or recalls 90% of the time/requires cueing < 10% of the time  Medical Problem List  and Plan:  1. DVT Prophylaxis/Anticoagulation: Pharmaceutical: Lovenox  2. Pain Management: N/A  3. Mood: pleasant and appropriate.  4. HTN: monitor with bid checks. Continue Norvasc and benazepril.  5. Dizziness: Vestibular eval and treat. Monitor for worsening of symptoms with increased mobility. Pt continues to work through. 6. Cdiff metronidazole colinization vs active infection symptoms-flagyl completed  -constipated, stool neg  for c diff 5/26, d/c prec     LOS (Days) 17 A FACE TO FACE EVALUATION WAS PERFORMED  [name] E 08/10/2011, 9:34 AM

## 2011-08-10 NOTE — Progress Notes (Signed)
Physical Therapy Note  Patient Details  Name: [name] MRN: [id number] Date of Birth: 1924-06-01 Today's Date: 08/10/2011  1415-1455 (40 minutes) individual Pain: no complaint of pain Focus of treatment:  Therapeutic activities/gait to facilitate balance reactions in stance and changing directions during gait Treatment: Biodex weight shift and limits of stability programs with decreased ankle/hip strategies to maintain standing balance; Gait without AD 50 feet X 2 min to occ. Mod assist for balance with intermittent max assist to prevent loss of balance around obstacles secondary to decreased timing (hesitancy) RT LE during swing.  [name],JIM 08/10/2011, 2:32 PM

## 2011-08-10 NOTE — Progress Notes (Signed)
Per State Regulation 482.30 This chart was reviewed for medical necessity with respect to the patient's Admission/Duration of stay. Pt participating in therapies with slow steady progress. Incontinence improving with timed toileting. Meryl Dare                 Nurse Care Manager            Next Review Date: 08/14/11

## 2011-08-10 NOTE — Progress Notes (Signed)
Occupational Therapy Skilled Therapy Intervention and  Weekly Progress Note  Patient Details  Name: [name] MRN: [id number] Date of Birth: 23-Jun-1924  Today's Date: 08/10/2011 Time: 0805-0900 Time Calculation (min): 55 min  Patient has met 3 of 3 short term goals.  Pt has been progressing well in all areas.  Patient continues to demonstrate the following deficits:decreased dynamic standing balance and therefore will continue to benefit from skilled OT intervention to enhance overall performance with BADL and Reduce care partner burden.  Patient progressing toward long term goals..  Continue plan of care.  OT will focus on ambulation with RW to bathroom and squat pivot bed to bedside commode for evening use.  OT Short Term Goals Week 1:  OT Short Term Goal 1 (Week 1): Pt will perfrom LB bathing 2/3 sessions with min assist sit to stand. OT Short Term Goal 1 - Progress (Week 1): Met OT Short Term Goal 2 (Week 1): Pt will perfrom LB dressing with min assist sit to stand 2/3 sessions. OT Short Term Goal 2 - Progress (Week 1): Met OT Short Term Goal 3 (Week 1): Pt will transfer stand pivot to and from the 3:1 with min assist. OT Short Term Goal 3 - Progress (Week 1): Other (comment) (Met - pt uses a squat pivot) OT Short Term Goal 4 (Week 1): Pt will perform 2 grooming tasks in standing with min no more than min assist for balance. OT Short Term Goal 4 - Progress (Week 1): Met Week 2:  OT Short Term Goal 1 (Week 2): Pt will sit to stand to sink with supervision to increase independence with LB dressing. OT Short Term Goal 1 - Progress (Week 2): Met OT Short Term Goal 2 (Week 2): Pt will stand at sink with supervision with one hand for support to pull pants up to increase LB dressing skills. OT Short Term Goal 2 - Progress (Week 2): Met OT Short Term Goal 3 (Week 2): Pt will complete tub bench transfer in tub with min assist. OT Short Term Goal 3 - Progress (Week 2): Met Week 3:  OT  Short Term Goal 1 (Week 3): Pt. will ambulate into bathroom with RW with steady assist. OT Short Term Goal 2 (Week 3): Pt will transfer bed to bedside commode with supervision using a squat pivot transfer.  Skilled Therapeutic Interventions/Progress Updates: Pt seen this am for BADL retraining with shower stall and toilet transfers with a focus on dynamic standing balance.  Pt ambulated into bathroom 2x from room (10-15 feet) with very light steady assist and minimal verbal cues.  Pt consistently recalled without cues to scoot hips forward and position feet, but needed one cue each time to push up from wheelchair and to reach back for chair.  No significant lean with ambulation.  Overall, pt is improving in his postural awareness and control. Pt able to stand statically with supervision and needs min assist with dynamic for dressing tasks. Continue  OT for 60-90 min for 5-7 days of the week for Balance/vestibular training;Cognitive remediation/compensation;Discharge planning;DME/adaptive equipment instruction;Functional mobility training;Patient/family education;Neuromuscular re-education;Self Care/advanced ADL retraining;Therapeutic Activities;Therapeutic Exercise;UE/LE Coordination activities.  Therapy Documentation Precautions:  Precautions Precautions: Fall Precaution Comments: very HOH Restrictions Weight Bearing Restrictions: None Pain: Pain Assessment Pain Assessment: No/denies pain ADL: Refer to FIM  See FIM for current functional status  Therapy/Group: Individual Therapy  Seven Dollens 08/10/2011, 10:23 AM

## 2011-08-10 NOTE — Progress Notes (Signed)
Physical Therapy Note  Patient Details  Name: [name] MRN: [id number] Date of Birth: 1925/01/31 Today's Date: 08/10/2011  [id number] (55 minutes) individual Pain: No complaint of pain Focus of treatment: Therapeutic activities to facilitate improved coordination RT LE during gait/strengthening RT LE; gait training with/without AD to facilitate balance reactions Treatment: Kinetron in standing with bilateral then unilateral then no UE support X 30 reps each session. Pt unable to maintain standing during weight shifts without UE support (at least unilateral); Gait without AD min/mod assist X 50 feet  ; Up/down 2 steps X 2 with one rail min assist and vcs for sequencing.   Francia Verry,JIM 08/10/2011, 9:39 AM

## 2011-08-10 NOTE — Progress Notes (Signed)
Speech Language Pathology Daily Session Note  Patient Details  Name: [name] MRN: [id number] Date of Birth: [birth year]  Today's Date: 08/10/2011 Time: 1305-1330 Time Calculation (min): 25 min  Short Term Goals:  SLP Short Term Goal 1 (Week 2): Patient will verbalize mildly complex needs/wants at the conversation level with min verbal cues for self correction of verbal errors  SLP Short Term Goal 2 (Week 2): Patient will utilize speech intelligibility strategies with min assist at the conversation level to improve intelligibility to 85%. SLP Short Term Goal 3 (Week 2): Patient will demonstrate intact problem solving for moderately complex ADLs with min assist. SLP Short Term Goal 4 (Week 2): Patient will demonstrate emergent awareness of cognitive-linguistic and physical deficits during functional ADLs with mod assist.  Skilled Therapeutic Interventions: Treatment focus on functional communication at the conversation level. SLP facilitated by utilizing the telephone to increase pt's utilization of speech intelligibility strategies of slow pace and overt articulation. Pt required Min verbal and semantic cues to self-monitor and correct intelligibility.  Pt did not demonstrate word-finding difficulty throughout the session.    Daily Session FIM:  Comprehension Comprehension Mode: Auditory Comprehension: 5-Follows basic conversation/direction: With no assist Expression Expression Mode: Verbal Expression: 5-Expresses basic needs/ideas: With extra time/assistive device Social Interaction Social Interaction: 6-Interacts appropriately with others with medication or extra time (anti-anxiety, antidepressant). Problem Solving Problem Solving: 5-Solves complex 90% of the time/cues < 10% of the time Memory Memory: 5-Recognizes or recalls 90% of the time/requires cueing < 10% of the time Pain No/Denies Pain  Therapy/Group: Individual Therapy  [name] 08/10/2011, 1:34 PM

## 2011-08-10 NOTE — Progress Notes (Signed)
Speech Language Pathology Weekly Progress Note  Patient Details  Name: [name] MRN: [id number] Date of Birth: [birth year]  Today's Date: 08/10/2011  Short Term Goals: Week 2: SLP Short Term Goal 1 (Week 2): Patient will verbalize mildly complex needs/wants at the conversation level with min verbal cues for self correction of verbal errors  SLP Short Term Goal 1 - Progress (Week 2): Met SLP Short Term Goal 2 (Week 2): Patient will utilize speech intelligibility strategies with min assist at the conversation level to improve intelligibility to 85%. SLP Short Term Goal 2 - Progress (Week 2): Met SLP Short Term Goal 3 (Week 2): Patient will demonstrate intact problem solving for moderately complex ADLs with min assist. SLP Short Term Goal 3 - Progress (Week 2): Met SLP Short Term Goal 4 (Week 2): Patient will demonstrate emergent awareness of cognitive-linguistic and physical deficits during functional ADLs with mod assist. SLP Short Term Goal 4 - Progress (Week 2): Met Week 3: SLP Short Term Goal 1 (Week 3): Patient will verbalize mildly complex needs/wants at the conversation level with supervision verbal cues for self correction of verbal errors  SLP Short Term Goal 2 (Week 3): Patient will demonstrate intact problem solving for moderately complex ADLs with supervision assist. SLP Short Term Goal 3 (Week 3): Patient will demonstrate emergent awareness of cognitive-linguistic and physical deficits during functional ADLs with min assist.  Weekly Progress Updates: Patient met 4 out of 4 short term objectives this week due to functional gains in speech intelligibility as a result of carryover of compensatory strategies and increased awareness of deficits and problem solving.  Patient continues to not initiate use of call bell; however, with staff questioning patient regarding needs he will request assist ance as needed.  Patient would continue to benefit from skilled SLP services to address  reducing level of cuing and as a result reducing burden of care upon discharge.  Caretakers would also benefit from education prior to discharge.     Daily Session Cognition: Overall Cognitive Status: Impaired Arousal/Alertness: Awake/alert Orientation Level: Oriented X4 Selective Attention: Appears intact Memory: Impaired (supervision ) Memory Impairment: Decreased short term memory;Decreased recall of new information;Retrieval deficit;Storage deficit Awareness: Impaired Awareness Impairment: Emergent impairment (supervision ) Problem Solving: Impaired Problem Solving Impairment: Functional basic (supervision-min assist) Executive Function: Self Monitoring;Self Correcting (hearing status impacts ability to with speech) Safety/Judgment: Appears intact Oral/Motor: Oral Motor/Sensory Function Overall Oral Motor/Sensory Function: Appears within functional limits for tasks assessed Labial ROM: Within Functional Limits Labial Symmetry: Within Functional Limits Labial Strength: Reduced Labial Sensation: Within Functional Limits Lingual ROM: Within Functional Limits Lingual Symmetry: Within Functional Limits Lingual Strength: Reduced Lingual Sensation: Within Functional Limits Facial ROM: Within Functional Limits Facial Symmetry: Within Functional Limits Facial Strength: Within Functional Limits Facial Sensation: Within Functional Limits Velum: Within Functional Limits Mandible: Within Functional Limits Motor Speech Overall Motor Speech: Impaired Respiration: Within functional limits Phonation: Normal Resonance: Within functional limits Articulation: Impaired Level of Impairment: Sentence Intelligibility: Intelligibility reduced Word: 75-100% accurate Phrase: 75-100% accurate Sentence: 75-100% accurate Conversation: 75-100% accurate Motor Planning: Witnin functional limits Motor Speech Errors (DO NOT USE): Not applicable Effective Techniques (DO NOT USE):  Over-articulate Comprehension: Auditory Comprehension Overall Auditory Comprehension: Impaired (supervision with basic ) Yes/No Questions: Within Functional Limits Commands: Impaired (supervision with basic impacted by hearing) Two Step Basic Commands: 50-74% accurate Conversation: Simple Interfering Components: Hearing EffectiveTechniques: Extra processing time;Repetition Visual Recognition/Discrimination Discrimination: Within Function Limits (with objects) Reading Comprehension Reading Status:  (pt. reports impaired at  baseline) Word level: Impaired Sentence Level: Impaired Paragraph Level: Impaired Interfering Components:  (? origin) Effective Techniques: Verbal cueing Expression: Expression Primary Mode of Expression: Verbal Verbal Expression Overall Verbal Expression: Appears within functional limits for tasks assessed Initiation: No impairment Automatic Speech: Name;Social Response Level of Generative/Spontaneous Verbalization: Conversation Repetition: No impairment Naming: Impairment Responsive: 76-100% accurate Confrontation: Within functional limits Convergent: 75-100% accurate Divergent: 75-100% accurate Other Naming Comments: errors occur in conversation and are compounded by dysarthria Verbal Errors: Not aware of errors Pragmatics: No impairment Interfering Components: Speech intelligibility;Other (comment) (hearing) Written Expression Dominant Hand: Right Written Expression: Not tested  Fae Pippin, M.A., CCC-SLP [phone]  Raeanne Deschler 08/10/2011, 2:29 PM

## 2011-08-10 NOTE — Progress Notes (Signed)
Social Work Patient ID: [name], male   DOB: Nov 27, 1924, 78 y.o.   MRN: [id number] Met with pt to inform team conference and spoke with daughter via telephone to schedule family education. She plans to come in this weekend with both boys and then have scheduled boys for Monday 9-12 to come in To complete family education.  Discussed with daughter want to make sure all are comfortable with pt's care and It may take more than one session to do this.  Pt requires many cues.  Will arrange follow up home health therapies And DME.  Work toward discharge Tues 6/4.

## 2011-08-11 DIAGNOSIS — G8110 Spastic hemiplegia affecting unspecified side: Secondary | ICD-10-CM

## 2011-08-11 DIAGNOSIS — I6330 Cerebral infarction due to thrombosis of unspecified cerebral artery: Secondary | ICD-10-CM

## 2011-08-11 DIAGNOSIS — Z5189 Encounter for other specified aftercare: Secondary | ICD-10-CM

## 2011-08-11 NOTE — Progress Notes (Signed)
Patient ID: [name], male   DOB: Sep 06, 1924, 78 y.o.   MRN: [id number] Subjective/Complaints: No pain , no breathing problem Review of Systems  Neurological: Positive for dizziness and tremors.  All other systems reviewed and are negative.    Objective: Vital Signs: Blood pressure 128/71, pulse 94, temperature 99.3 F (37.4 C), temperature source Oral, resp. rate 18, height 5\' 7"  (1.702 m), weight 80.6 kg (177 lb 11.1 oz), SpO2 93.00%. No results found. No results found for this or any previous visit (from the past 72 hour(s)).   HEENT: normal Cardio: RRR Resp: CTA B/L GI: BS positive Extremity:  Pulses positive and No Edema Skin:   Intact Neuro: Alert/Oriented, Cranial Nerve II-XII normal, Normal Sensory, Normal Motor and Other ataxia on finger to nose as well as heel to shin Still ataxic decreased finger nose finger and heel/shin testing on Right, dysarthric, HOH  Assessment/Plan: 1. Functional deficits secondary to R superior and lateral cerebellar infarct which require 3+ hours per day of interdisciplinary therapy in a comprehensive inpatient rehab setting. Physiatrist is providing close team supervision and 24 hour management of active medical problems listed below. Physiatrist and rehab team continue to assess barriers to discharge/monitor patient progress toward functional and medical goals. FIM: FIM - Bathing Bathing Steps Patient Completed: Chest;Right Arm;Left Arm;Abdomen;Front perineal area;Buttocks;Right upper leg;Left upper leg;Right lower leg (including foot);Left lower leg (including foot) Bathing: 5: Supervision: Safety issues/verbal cues  FIM - Upper Body Dressing/Undressing Upper body dressing/undressing steps patient completed: Thread/unthread left sleeve of pullover shirt/dress;Pull shirt over trunk;Put head through opening of pull over shirt/dress;Thread/unthread right sleeve of pullover shirt/dresss Upper body dressing/undressing: 5: Supervision: Safety  issues/verbal cues FIM - Lower Body Dressing/Undressing Lower body dressing/undressing steps patient completed: Thread/unthread right pants leg;Thread/unthread left pants leg;Pull pants up/down;Don/Doff right sock;Don/Doff left sock;Don/Doff right shoe;Don/Doff left shoe;Fasten/unfasten right shoe;Fasten/unfasten left shoe Lower body dressing/undressing: 5: Supervision: Safety issues/verbal cues  FIM - Toileting Toileting steps completed by patient: Adjust clothing prior to toileting;Performs perineal hygiene;Adjust clothing after toileting Toileting Assistive Devices: Grab bar or rail for support Toileting: 5: Supervision: Safety issues/verbal cues  FIM - Diplomatic Services operational officer Devices: Grab bars;Walker Toilet Transfers: 4-To toilet/BSC: Min A (steadying Pt. > 75%);4-From toilet/BSC: Min A (steadying Pt. > 75%)  FIM - Bed/Chair Transfer Bed/Chair Transfer Assistive Devices: Therapist, occupational: 5: Supine > Sit: Supervision (verbal cues/safety issues);4: Bed > Chair or W/C: Min A (steadying Pt. > 75%)  FIM - Locomotion: Wheelchair Distance: 160' including home environment Locomotion: Wheelchair: 0: Activity did not occur FIM - Locomotion: Ambulation Locomotion: Ambulation Assistive Devices: Designer, industrial/product Ambulation/Gait Assistance: 4: Min assist Locomotion: Ambulation: 1: Travels less than 50 ft with minimal assistance (Pt.>75%)  Comprehension Comprehension Mode: Auditory Comprehension: 5-Follows basic conversation/direction: With no assist  Expression Expression Mode: Verbal Expression: 5-Expresses basic needs/ideas: With extra time/assistive device  Social Interaction Social Interaction: 6-Interacts appropriately with others with medication or extra time (anti-anxiety, antidepressant).  Problem Solving Problem Solving: 5-Solves complex 90% of the time/cues < 10% of the time  Memory Memory: 5-Recognizes or recalls 90% of the time/requires cueing  < 10% of the time  Medical Problem List and Plan:  1. DVT Prophylaxis/Anticoagulation: Pharmaceutical: Lovenox  2. Pain Management: N/A  3. Mood: pleasant and appropriate.  4. HTN: monitor with bid checks. Continue Norvasc and benazepril.  5. Dizziness: Vestibular eval and treat. Monitor for worsening of symptoms with increased mobility. Pt continues to work through.   LOS (Days) 18 A FACE  TO FACE EVALUATION WAS PERFORMED  Malak Duchesneau E 08/11/2011, 10:29 AM

## 2011-08-11 NOTE — Progress Notes (Signed)
Physical Therapy Session Note  Patient Details  Name: [name] MRN: [id number] Date of Birth: 1925/01/29  Today's Date: 08/11/2011 Time: 0805-0900 Time Calculation (min): 55 min  Short Term Goals: Week 3:  PT Short Term Goal 1 (Week 3): STGs = LTGs  Skilled Therapeutic Interventions/Progress Updates: focus on neuromuscular re-education, mobility, car transfer  W/c mobility x 150' modified independent using bil LEs for RLE coordination.  Car transfer x 2: w/c>< simulated car, sedan ht seat, with min/mod assist, and RW>< simulated car, min assist.  neuromuscular re-education RLE with foot taps onto 8' high step, 10 x 2, with mod assist for balance due to difficulty shifting wt completely to L.  Gait training with HHA , x 150' including multiplel turns to the R; min assist except for turns, requiring mod assist, as RLE does not initiate swing phase as needed unless constatly cued.  Gait up/down 4 steps with 2 rails, with min steady assist, without cues for sequencing.  Pt reports daughter will be in this weekend, and grandsons in Monday for family ed.  Grandsons to be with him at home while pt's daughter works.     Therapy Documentation Precautions:  Precautions Precautions: Fall Precaution Comments: very HOH Restrictions Weight Bearing Restrictions: No   Pain: Pain Assessment Pain Assessment: No/denies pain Pain Score: 0-No pain Faces Pain Scale: No hurt        See FIM for current functional status  Therapy/Group: Individual Therapy  Shaneice Barsanti 08/11/2011, 12:14 PM

## 2011-08-11 NOTE — Progress Notes (Signed)
Occupational Therapy Session Note  Patient Details  Name: [name] MRN: [id number] Date of Birth: Oct 07, 1924  Today's Date: 08/11/2011 Time: 0805-0900 Time Calculation (min): 55 min  Short Term Goals: Week 1:  OT Short Term Goal 1 (Week 1): Pt will perfrom LB bathing 2/3 sessions with min assist sit to stand. OT Short Term Goal 1 - Progress (Week 1): Met OT Short Term Goal 2 (Week 1): Pt will perfrom LB dressing with min assist sit to stand 2/3 sessions. OT Short Term Goal 2 - Progress (Week 1): Met OT Short Term Goal 3 (Week 1): Pt will transfer stand pivot to and from the 3:1 with min assist. OT Short Term Goal 3 - Progress (Week 1): Other (comment) (Met - pt uses a squat pivot) OT Short Term Goal 4 (Week 1): Pt will perform 2 grooming tasks in standing with min no more than min assist for balance. OT Short Term Goal 4 - Progress (Week 1): Met Week 2:  OT Short Term Goal 1 (Week 2): Pt will sit to stand to sink with supervision to increase independence with LB dressing. OT Short Term Goal 1 - Progress (Week 2): Met OT Short Term Goal 2 (Week 2): Pt will stand at sink with supervision with one hand for support to pull pants up to increase LB dressing skills. OT Short Term Goal 2 - Progress (Week 2): Met OT Short Term Goal 3 (Week 2): Pt will complete tub bench transfer in tub with min assist. OT Short Term Goal 3 - Progress (Week 2): Met Week 3:  OT Short Term Goal 1 (Week 3): Pt. will ambulate into bathroom with RW with steady assist. OT Short Term Goal 2 (Week 3): Pt will transfer bed to bedside commode with supervision using a squat pivot transfer.  Skilled Therapeutic Interventions/Progress Updates: Pt seen for BADL retraining of bathing and dressing with a focus on functional transfers and standing balance.  Pt continues to need cues to prepare to stand.  Today he was bracing his legs against the bed, needed cues to keep balance forward.  Dynamic sitting balance activities from  EOB with BUE overhead.  Pt tends to lean back and needs to cues to forward flex at hips.  Gross motor coordination with dowel exercises.       Therapy Documentation Precautions:  Precautions Precautions: Fall Precaution Comments: very HOH;Restrictions Weight Bearing Restrictions: No Pain: Pain Assessment Pain Assessment: No/denies pain Pain Score: 0-No pain Faces Pain Scale: No hurt  See FIM for current functional status  Therapy/Group: Individual Therapy  Buffi Ewton 08/11/2011, 11:36 AM

## 2011-08-11 NOTE — Progress Notes (Signed)
Speech Language Pathology Daily Session Note  Patient Details  Name: [name] MRN: [id number] Date of Birth: 11/20/1924  Today's Date: 08/11/2011 Time: 4098-1191 Time Calculation (min): 25 min  Short Term Goals: Week 3: SLP Short Term Goal 1 (Week 3): Patient will verbalize mildly complex needs/wants at the conversation level with supervision verbal cues for self correction of verbal errors  SLP Short Term Goal 2 (Week 3): Patient will demonstrate intact problem solving for moderately complex ADLs with supervision assist. SLP Short Term Goal 3 (Week 3): Patient will demonstrate emergent awareness of cognitive-linguistic and physical deficits during functional ADLs with min assist.  Skilled Therapeutic Interventions: Session focused on family education and skilled treatment of speech intelligibility; Patient required a quiet environment, visualization of speaker's mouth and minimal assist verbal cues to verbally express information regarding discharge planning with 95% intelligibility.  SLP spoke to patient's daughter on the phone and she stated that she was able to understand her dad when it is quiet with no other distractions and PTA she was able to watch TV and still understand him.  SLP educated her on how difficult it is for him to self monitor and correct his speech due to his hearing loss.  SLP unsure of prognosis for increased speech intelligibility.   Daily Session Precautions/Restrictions    FIM:  Comprehension Comprehension Mode: Auditory Comprehension: 5-Set-up assist with hearing device Expression Expression Mode: Verbal Expression: 5-Expresses basic needs/ideas: With extra time/assistive device Social Interaction Social Interaction: 6-Interacts appropriately with others with medication or extra time (anti-anxiety, antidepressant). Problem Solving Problem Solving: 5-Solves complex 90% of the time/cues < 10% of the time Memory Memory: 5-Recognizes or recalls 90% of  the time/requires cueing < 10% of the time FIM - Eating Eating Activity: 5: Set-up assist for open containers General    Pain Pain Assessment Pain Assessment: No/denies pain Pain Score: 0-No pain  Therapy/Group: Individual Therapy  Charlane Ferretti., CCC-SLP [phone]  Jannel Lynne 08/11/2011, 3:54 PM

## 2011-08-11 NOTE — Progress Notes (Signed)
Occupational Therapy Session Note  Patient Details  Name: [name] MRN: [id number] Date of Birth: November 16, 1924  Today's Date: 08/11/2011 Time: 1100-1153 Time Calculation (min): 53 min   Skilled Therapeutic Interventions/Progress Updates:    Pt seen this am for OT individual treatment session for ADL retraining with focus on functional transfers and standing balance as well as standing and reaching overhead. Pt benefits from cues to prepare to stand (scoot to EOB, w/c, 3:1 commode in ADL apartment). Pt was CGA-SBA sit to stand from bed in ADL apartment x3 for transfers to 3:1 bedside. Pt also benefits from VC's to monitor BOS & to lift R LE especially when ambulating on carpet w/ RW and when becoming fatigued (functional mobility from apartment-pt room w/ 1 rest break, Min A and VC/TC's.   Therapy Documentation Precautions:  Precautions Precautions: Fall Precaution Comments: very HOH; no battery for hearing aid this AM Restrictions Weight Bearing Restrictions: No     Pain: Pain Assessment Pain Assessment: No/denies pain ADL: ADL Eating: Set up Where Assessed-Eating: Wheelchair Grooming: Supervision/safety Where Assessed-Grooming: Wheelchair Upper Body Bathing: Supervision/safety Where Assessed-Upper Body Bathing: Shower Lower Body Bathing: Supervision/safety Where Assessed-Lower Body Bathing: Shower Upper Body Dressing: Setup Where Assessed-Upper Body Dressing: Wheelchair Lower Body Dressing: Minimal assistance Where Assessed-Lower Body Dressing: Wheelchair Toileting: Minimal assistance Where Assessed-Toileting: Teacher, adult education: Curator Method: Ambulance person: Engineer, technical sales: Not assessed Tub/Shower Equipment: Transfer tub bench;Grab bars;Walk in shower Walk-In Shower Transfer: Minimal assistance Film/video editor Method: Squat pivot ADL Comments: Pt overall needs min to mod instructional  cues to sequence bathing task.  Mod assist for sit to stand from wheelchair, with max instructional cues for foot and hand placement.  Pt with feet way out in front of him for sit to stand and with wide base of support.  LOB to the right and posteriorly in standing when attempting to perform perianal washing.     :    See FIM for current functional status  Therapy/Group: Individual Therapy  Alm Bustard 08/11/2011, 1:12 PM

## 2011-08-12 NOTE — Progress Notes (Signed)
Physical Therapy Note  Patient Details  Name: [name] MRN: [id number] Date of Birth: [birth year] Today's Date: 08/12/2011  1000-1055 (55 minutes) group Pain : no complaint of pain Pt participated in PT group session focused on gait training/safety/endurance. Pt gait without AD to facilitate balance reactions 80 feet X 3 min to intermittent max assist secondary to uncorrected loss of balance to right when changing directions. (using multiple turns).  Jemiah Ellenburg,JIM 08/12/2011, 11:24 AM

## 2011-08-12 NOTE — Progress Notes (Signed)
Patient alert and oriented x 3.  Patient is hard of hearing, poor safety awareness.  Incontinent of bowel at times, last BM 5/28, given colace as scheduled.  Patient refused prn laxatives. Toileting q3h during the day, and condom catheter on at night.  Denies pain, take medications whole with water.

## 2011-08-12 NOTE — Progress Notes (Signed)
Patient ID: [name], male   DOB: January 04, 1925, 78 y.o.   MRN: [id number] Subjective/Complaints: No pain , no breathing problem.  Daughter in for training.  We discussed the pts neuro deficits Review of Systems  Neurological: Positive for dizziness and tremors.  All other systems reviewed and are negative.    Objective: Vital Signs: Blood pressure 139/87, pulse 79, temperature 97.2 F (36.2 C), temperature source Oral, resp. rate 18, height 5\' 7"  (1.702 m), weight 80.6 kg (177 lb 11.1 oz), SpO2 96.00%. No results found. No results found for this or any previous visit (from the past 72 hour(s)).   HEENT: normal Cardio: RRR Resp: CTA B/L GI: BS positive Extremity:  Pulses positive and No Edema Skin:   Intact Neuro: Alert/Oriented, Cranial Nerve II-XII normal, Normal Sensory, Normal Motor and Other ataxia on finger to nose as well as heel to shin Still ataxic decreased finger nose finger and heel/shin testing on Right, dysarthric, HOH  Assessment/Plan: 1. Functional deficits secondary to R superior and lateral cerebellar infarct which require 3+ hours per day of interdisciplinary therapy in a comprehensive inpatient rehab setting. Physiatrist is providing close team supervision and 24 hour management of active medical problems listed below. Physiatrist and rehab team continue to assess barriers to discharge/monitor patient progress toward functional and medical goals. FIM: FIM - Bathing Bathing Steps Patient Completed: Chest;Right Arm;Left Arm;Abdomen;Front perineal area;Buttocks;Right upper leg;Left upper leg;Right lower leg (including foot);Left lower leg (including foot) Bathing: 5: Supervision: Safety issues/verbal cues  FIM - Upper Body Dressing/Undressing Upper body dressing/undressing steps patient completed: Thread/unthread left sleeve of pullover shirt/dress;Pull shirt over trunk;Put head through opening of pull over shirt/dress;Thread/unthread right sleeve of pullover  shirt/dresss Upper body dressing/undressing: 5: Supervision: Safety issues/verbal cues FIM - Lower Body Dressing/Undressing Lower body dressing/undressing steps patient completed: Thread/unthread right pants leg;Thread/unthread left pants leg;Pull pants up/down;Don/Doff right sock;Don/Doff left sock;Don/Doff right shoe;Don/Doff left shoe;Fasten/unfasten right shoe;Fasten/unfasten left shoe Lower body dressing/undressing: 5: Supervision: Safety issues/verbal cues  FIM - Toileting Toileting steps completed by patient: Adjust clothing prior to toileting;Performs perineal hygiene;Adjust clothing after toileting Toileting Assistive Devices: Grab bar or rail for support Toileting: 5: Supervision: Safety issues/verbal cues  FIM - Diplomatic Services operational officer Devices: Grab bars;Walker Toilet Transfers: 4-To toilet/BSC: Min A (steadying Pt. > 75%);4-From toilet/BSC: Min A (steadying Pt. > 75%)  FIM - Bed/Chair Transfer Bed/Chair Transfer Assistive Devices: Therapist, occupational: 5: Supine > Sit: Supervision (verbal cues/safety issues);4: Bed > Chair or W/C: Min A (steadying Pt. > 75%)  FIM - Locomotion: Wheelchair Distance: 160' including home environment Locomotion: Wheelchair: 6: Travels 150 ft or more, turns around, maneuvers to table, bed or toilet, negotiates 3% grade: maneuvers on rugs and over door sills independently FIM - Locomotion: Ambulation Locomotion: Ambulation Assistive Devices: Designer, industrial/product Ambulation/Gait Assistance: 4: Min assist Locomotion: Ambulation: 1: Travels less than 50 ft with minimal assistance (Pt.>75%)  Comprehension Comprehension Mode: Auditory Comprehension: 5-Set-up assist with hearing device  Expression Expression Mode: Verbal Expression: 5-Expresses basic needs/ideas: With extra time/assistive device  Social Interaction Social Interaction: 6-Interacts appropriately with others with medication or extra time (anti-anxiety,  antidepressant).  Problem Solving Problem Solving: 5-Solves complex 90% of the time/cues < 10% of the time  Memory Memory: 5-Recognizes or recalls 90% of the time/requires cueing < 10% of the time  Medical Problem List and Plan:  1. DVT Prophylaxis/Anticoagulation: Pharmaceutical: Lovenox  2. Pain Management: N/A  3. Mood: pleasant and appropriate.  4. HTN: monitor with bid checks.  Continue Norvasc and benazepril.  5. Dizziness: Vestibular eval and treat. Monitor for worsening of symptoms with increased mobility. Pt continues to work through.   LOS (Days) 19 A FACE TO FACE EVALUATION WAS PERFORMED  [name] E 08/12/2011, 9:45 AM

## 2011-08-13 NOTE — Progress Notes (Signed)
Wearing condom cath at hs, timed toilet every 3 hours during day. HOH. Mild pitting edema to left foot and ankle. Decreased safety awareness, using bed alarm to remind patient not to get up without assist and alert staff.Tawanna Solo

## 2011-08-13 NOTE — Progress Notes (Signed)
Patient ID: [name], male   DOB: 04-23-1924, 78 y.o.   MRN: [id number] Subjective/Complaints: No pain , no breathing problem.  Daughter in for training.  We discussed the pts neuro deficits Review of Systems  Neurological: Positive for dizziness and tremors.  All other systems reviewed and are negative.    Objective: Vital Signs: Blood pressure 125/69, pulse 73, temperature 98.6 F (37 C), temperature source Oral, resp. rate 18, height 5\' 7"  (1.702 m), weight 80.6 kg (177 lb 11.1 oz), SpO2 93.00%. No results found. No results found for this or any previous visit (from the past 72 hour(s)).   HEENT: normal Cardio: RRR Resp: CTA B/L GI: BS positive Extremity:  Pulses positive and No Edema Skin:   Intact Neuro: Alert/Oriented, Cranial Nerve II-XII normal, Normal Sensory, Normal Motor and Other ataxia on finger to nose as well as heel to shin Still ataxic decreased finger nose finger and heel/shin testing on Right, dysarthric, HOH  Assessment/Plan: 1. Functional deficits secondary to R superior and lateral cerebellar infarct which require 3+ hours per day of interdisciplinary therapy in a comprehensive inpatient rehab setting. Physiatrist is providing close team supervision and 24 hour management of active medical problems listed below. Physiatrist and rehab team continue to assess barriers to discharge/monitor patient progress toward functional and medical goals. FIM: FIM - Bathing Bathing Steps Patient Completed: Chest;Right Arm;Left Arm;Abdomen;Front perineal area;Buttocks;Right upper leg;Left upper leg;Right lower leg (including foot);Left lower leg (including foot) Bathing: 5: Supervision: Safety issues/verbal cues  FIM - Upper Body Dressing/Undressing Upper body dressing/undressing steps patient completed: Thread/unthread left sleeve of pullover shirt/dress;Put head through opening of pull over shirt/dress;Pull shirt over trunk;Thread/unthread right sleeve of pullover  shirt/dresss Upper body dressing/undressing: 5: Supervision: Safety issues/verbal cues FIM - Lower Body Dressing/Undressing Lower body dressing/undressing steps patient completed: Thread/unthread left pants leg;Thread/unthread right pants leg;Pull underwear up/down;Thread/unthread left underwear leg Lower body dressing/undressing: 5: Supervision: Safety issues/verbal cues  FIM - Toileting Toileting steps completed by patient: Adjust clothing prior to toileting;Performs perineal hygiene;Adjust clothing after toileting Toileting Assistive Devices: Grab bar or rail for support Toileting: 5: Supervision: Safety issues/verbal cues  FIM - Diplomatic Services operational officer Devices: Grab bars Toilet Transfers: 4-To toilet/BSC: Min A (steadying Pt. > 75%);4-From toilet/BSC: Min A (steadying Pt. > 75%)  FIM - Bed/Chair Transfer Bed/Chair Transfer Assistive Devices: Therapist, occupational: 5: Supine > Sit: Supervision (verbal cues/safety issues);4: Bed > Chair or W/C: Min A (steadying Pt. > 75%)  FIM - Locomotion: Wheelchair Distance: 160' including home environment Locomotion: Wheelchair: 6: Travels 150 ft or more, turns around, maneuvers to table, bed or toilet, negotiates 3% grade: maneuvers on rugs and over door sills independently FIM - Locomotion: Ambulation Locomotion: Ambulation Assistive Devices: TEFL teacher Ambulation/Gait Assistance: 4: Min assist Locomotion: Ambulation: 4: Travels 150 ft or more with minimal assistance (Pt.>75%)  Comprehension Comprehension Mode: Auditory Comprehension: 5-Set-up assist with hearing device  Expression Expression Mode: Verbal Expression: 5-Expresses basic 90% of the time/requires cueing < 10% of the time.  Social Interaction Social Interaction: 5-Interacts appropriately 90% of the time - Needs monitoring or encouragement for participation or interaction.  Problem Solving Problem Solving: 5-Solves basic 90% of the time/requires  cueing < 10% of the time  Memory Memory: 4-Recognizes or recalls 75 - 89% of the time/requires cueing 10 - 24% of the time  Medical Problem List and Plan:  1. DVT Prophylaxis/Anticoagulation: Pharmaceutical: Lovenox  2. Pain Management: N/A  3. Mood: pleasant and appropriate.  4. HTN:  monitor with bid checks. Continue Norvasc and benazepril.  5. Dizziness: Vestibular eval and treat. Monitor for worsening of symptoms with increased mobility. Pt continues to work through.   LOS (Days) 20 A FACE TO FACE EVALUATION WAS PERFORMED  Hannan Tetzlaff E 08/13/2011, 8:19 AM

## 2011-08-13 NOTE — Progress Notes (Signed)
Physical Therapy Note  Patient Details  Name: [name] MRN: [id number] Date of Birth: Dec 05, 1924 Today's Date: 08/13/2011  0930-1025 (55 minutes) group Pain: no complaint of pain Pt participated in PT group session focused on gait training/safety/endurance. Pt ambulates 160 feet X 1 with RW min to close supervision when ambulating in straight direction and min assist during turns to prevent falls secondary to decreased BOS (adducted RT LE); standing balance- tennis close SBA; gait 80 feet min assist without AD (therapist only).   [name],JIM 08/13/2011, 10:46 AM

## 2011-08-14 DIAGNOSIS — I6330 Cerebral infarction due to thrombosis of unspecified cerebral artery: Secondary | ICD-10-CM

## 2011-08-14 DIAGNOSIS — Z5189 Encounter for other specified aftercare: Secondary | ICD-10-CM

## 2011-08-14 DIAGNOSIS — G8110 Spastic hemiplegia affecting unspecified side: Secondary | ICD-10-CM

## 2011-08-14 NOTE — Progress Notes (Signed)
Social Work Patient ID: [name], male   DOB: Aug 31, 1924, 78 y.o.   MRN: [id number] Met with pt and grandson who was here for family education.  Explained to grandson to take off his headphones and listen to the Therapist.  Want both to feel comfortable with his care and have a successful discharge home.  DEM to be delivered today to pt's room And follow up home health is arranged.

## 2011-08-14 NOTE — Progress Notes (Signed)
Patient ID: [name], male   DOB: [birth year], 78 y.o.   MRN: [id number] Subjective/Complaints: No pain , no breathing problem.  Daughter in for training.  We discussed the pts neuro deficits Review of Systems  Neurological: Positive for dizziness and tremors.  All other systems reviewed and are negative.    Objective: Vital Signs: Blood pressure 146/81, pulse 79, temperature 98.5 F (36.9 C), temperature source Oral, resp. rate 18, height 5\' 7"  (1.702 m), weight 80.6 kg (177 lb 11.1 oz), SpO2 95.00%. No results found. No results found for this or any previous visit (from the past 72 hour(s)).   HEENT: normal Cardio: RRR Resp: CTA B/L GI: BS positive Extremity:  Pulses positive and No Edema Skin:   Intact Neuro: Alert/Oriented, Cranial Nerve II-XII normal, Normal Sensory, Normal Motor and Other ataxia on finger to nose as well as heel to shin Still ataxic decreased finger nose finger and heel/shin testing on Right, dysarthric, HOH  Assessment/Plan: 1. Functional deficits secondary to R superior and lateral cerebellar infarct which require 3+ hours per day of interdisciplinary therapy in a comprehensive inpatient rehab setting. Plan D/C in am FIM: FIM - Bathing Bathing Steps Patient Completed: Chest;Right Arm;Left Arm;Abdomen;Front perineal area;Buttocks;Right upper leg;Left upper leg;Right lower leg (including foot);Left lower leg (including foot) Bathing: 5: Supervision: Safety issues/verbal cues  FIM - Upper Body Dressing/Undressing Upper body dressing/undressing steps patient completed: Pull shirt over trunk;Put head through opening of pull over shirt/dress;Thread/unthread right sleeve of pullover shirt/dresss;Thread/unthread left sleeve of pullover shirt/dress Upper body dressing/undressing: 5: Supervision: Safety issues/verbal cues FIM - Lower Body Dressing/Undressing Lower body dressing/undressing steps patient completed: Thread/unthread right underwear  leg;Thread/unthread left underwear leg;Don/Doff right sock;Don/Doff left sock;Thread/unthread right pants leg;Thread/unthread left pants leg;Pull underwear up/down;Fasten/unfasten pants;Pull pants up/down Lower body dressing/undressing: 5: Supervision: Safety issues/verbal cues  FIM - Toileting Toileting steps completed by patient: Adjust clothing prior to toileting;Performs perineal hygiene;Adjust clothing after toileting Toileting Assistive Devices: Grab bar or rail for support Toileting: 5: Supervision: Safety issues/verbal cues  FIM - Diplomatic Services operational officer Devices: Grab bars Toilet Transfers: 4-To toilet/BSC: Min A (steadying Pt. > 75%)  FIM - Bed/Chair Transfer Bed/Chair Transfer Assistive Devices: Therapist, occupational: 5: Bed > Chair or W/C: Supervision (verbal cues/safety issues);5: Chair or W/C > Bed: Supervision (verbal cues/safety issues);5: Sit > Supine: Supervision (verbal cues/safety issues);5: Supine > Sit: Supervision (verbal cues/safety issues)  FIM - Locomotion: Wheelchair Distance: 160' including home environment Locomotion: Wheelchair: 6: Travels 150 ft or more, turns around, maneuvers to table, bed or toilet, negotiates 3% grade: maneuvers on rugs and over door sills independently FIM - Locomotion: Ambulation Locomotion: Ambulation Assistive Devices: Designer, industrial/product Ambulation/Gait Assistance: 4: Min assist Locomotion: Ambulation: 4: Travels 150 ft or more with minimal assistance (Pt.>75%)  Comprehension Comprehension Mode: Auditory Comprehension: 5-Set-up assist with hearing device  Expression Expression Mode: Verbal Expression: 5-Expresses basic 90% of the time/requires cueing < 10% of the time.  Social Interaction Social Interaction: 4-Interacts appropriately 75 - 89% of the time - Needs redirection for appropriate language or to initiate interaction.  Problem Solving Problem Solving: 4-Solves basic 75 - 89% of the time/requires  cueing 10 - 24% of the time  Memory Memory: 4-Recognizes or recalls 75 - 89% of the time/requires cueing 10 - 24% of the time  Medical Problem List and Plan:  1. DVT Prophylaxis/Anticoagulation: Pharmaceutical: Lovenox  2. Pain Management: N/A  3. Mood: pleasant and appropriate.  4. HTN: monitor with bid checks. Continue Norvasc  and benazepril.  5. Dizziness: Vestibular eval and treat. Monitor for worsening of symptoms with increased mobility. Pt continues to work through.   LOS (Days) 21 A FACE TO FACE EVALUATION WAS PERFORMED  [name] E 08/14/2011, 8:07 AM

## 2011-08-14 NOTE — Progress Notes (Signed)
Social Work Patient ID: [name], male   DOB: 24-Jul-1924, 78 y.o.   MRN: [id number] Pt requires a lightweight wheelchair to be able to self propel.  He is unable to self propel in a standard wheelchair.  He also does his self Care in his wheelchair.  Pt finishing up family education for discharge tomorrow.  All DME ordered via [facility] and to be  Delivered to pt's room.

## 2011-08-14 NOTE — Progress Notes (Signed)
Wearing condom cath at [facility], timed toileting during day.[name]

## 2011-08-14 NOTE — Progress Notes (Signed)
Speech Language Pathology Daily Session Note & Discharge Summary  Patient Details  Name: KAPENA HAMME MRN: [id number] Date of Birth: 11-10-1924  Today's Date: 08/14/2011 Time: 1000-1025 Time Calculation (min): 25 min  Short Term Goals: Week 3: SLP Short Term Goal 1 (Week 3): Patient will verbalize mildly complex needs/wants at the conversation level with supervision verbal cues for self correction of verbal errors  SLP Short Term Goal 2 (Week 3): Patient will demonstrate intact problem solving for moderately complex ADLs with supervision assist. SLP Short Term Goal 3 (Week 3): Patient will demonstrate emergent awareness of cognitive-linguistic and physical deficits during functional ADLs with min assist.  Skilled Therapeutic Interventions: Session focused on skilled treatment of speech-language deficits; grandson present for family education.  SLP facilitated session with supervision semantic cues to facilitate accuracy with naming/word finding during greeting, as well as supervision semantic cues to utilize slow speech throughout session to increase intelligibility.  Patient introduced SLP to his nephew and after cue for SLP he was able to correct to grandson.  SLP also educated grandson on how the patient's haring loss impacts his ability to self monitor his speech-language.  Grandson verbalized understanding and returned demonstration of compensatory strategies such as facing patient while talking, making the environment quiet while talking and repeating information as needed.  Grandson stated that naming errors and speech intelligibility are worse since CVA and patient stated he wants to continue to work on his speech.  SLP also educated grandson on need to anticipate some of patient's needs because he is unable to and as a result will not always request assistance when he needs it.  Daily Session Precautions/Restrictions  Restrictions Weight Bearing Restrictions: No FIM:   Comprehension Comprehension Mode: Auditory Comprehension: 5-Follows basic conversation/direction: With extra time/assistive device Expression Expression Mode: Verbal Expression: 5-Expresses basic 90% of the time/requires cueing < 10% of the time. Social Interaction Social Interaction: 5-Interacts appropriately 90% of the time - Needs monitoring or encouragement for participation or interaction. Problem Solving Problem Solving: 5-Solves basic 90% of the time/requires cueing < 10% of the time Memory Memory: 6-More than reasonable amt of time General    Pain Pain Assessment Pain Assessment: No/denies pain Pain Score: 0-No pain  Therapy/Group: Individual Therapy   Speech Language Pathology Discharge Summary  Patient Details  Name: ARCANGEL MINION MRN: [id number] Date of Birth: 07-09-1924  Today's Date: 08/14/2011  Patient has met 4 of 4 long term goals due to gains in speech intelligibility, awareness and problem solving.  Patient to discharge at overall Supervision level.  Patient's care partner is independent to provide the necessary cognitive and lingustic assistance at discharge.  Reasons goals not met: n/a  Recommendation:  Patient will benefit from ongoing skilled SLP services in home health setting to continue to advance functional skills in the area of functional communication and maximize cognitive function and overall independence.  Equipment: none  Reasons for discharge: treatment goals met and discharge from hospital  Patient/family agrees with progress made and goals achieved: Yes  See FIM for current functional status  Charlane Ferretti., CCC-SLP [phone]  Kahlia Lagunes 08/14/2011, 11:14 AM

## 2011-08-14 NOTE — Progress Notes (Addendum)
Physical Therapy Discharge Summary  Patient Details  Name: [name] MRN: [id number] Date of Birth: [birth year]  Today's Date: 08/14/2011 Time: 1100-1205 Time Calculation (min): 65 min  Patient has met 7 of 8 long term goals due to improved balance, improved postural control, ability to compensate for deficits and functional use of  right upper extremity and right lower extremity.  Patient to discharge at w/c and ambulatory with RW level Min Assist.   Patient's care partner is independent to provide the necessary physical and cognitive assistance at discharge. Grandson completed all family education today.  Reasons goals not met: needs min A to stand at times, is S with squat /stand pivot  Recommendation:  Patient will benefit from ongoing skilled PT services in home health setting to continue to advance safe functional mobility, address ongoing impairments in motor control and coordination of R side, coordination, postural control and balance, and minimize fall risk.  Equipment: w/c  Reasons for discharge: discharge from hospital  Patient/family agrees with progress made and goals achieved: Yes  PT Discharge Precautions/Restrictions  falls, HOH Decreased anticipatory awareness   Sensation Sensation Light Touch: Appears Intact Proprioception: Impaired by gross assessment Additional Comments: RLE Motor  Motor Motor: Abnormal postural alignment and control  Mobility   Locomotion  Ambulation Ambulation/Gait Assistance: 4: Min assist  Trunk/Postural Assessment  Cervical Assessment Cervical Assessment: Within Functional Limits Thoracic Assessment Thoracic Assessment: Within Functional Limits Lumbar Assessment Lumbar Assessment: Within Functional Limits Postural Control Postural Control: Deficits on evaluation Righting Reactions: decreased balance strategies, tends to fall R  Balance Static Sitting Balance Static Sitting - Balance Support: Feet supported Static  Sitting - Level of Assistance: 7: Independent Dynamic Sitting Balance Dynamic Sitting - Level of Assistance: 5: Stand by assistance Static Standing Balance Static Standing - Level of Assistance: 5: Stand by assistance Dynamic Standing Balance Dynamic Standing - Balance Support: During functional activity Dynamic Standing - Level of Assistance: 4: Min assist Extremity Assessment  RUE Assessment RUE Assessment:  (pt able to use RUE in functional tasks) LUE Assessment LUE Assessment:  (Pt can use LUE in functional tasks) RLE Assessment RLE Assessment: Within Functional Limits LLE Assessment LLE Assessment: Within Functional Limits  See FIM for current functional status  Michaelene Song 08/14/2011, 4:10 PM

## 2011-08-14 NOTE — Progress Notes (Signed)
Physical Therapy Session Note  Patient Details  Name: [name] MRN: [id number] Date of Birth: December 29, 1924  Today's Date: 08/14/2011 Time: 0815-0900 Time Calculation (min): 45 min  Short Term Goals: D/c goals  Skilled Therapeutic Interventions/Progress Updates:    Gait training with RW  >150 ft with improved postural control, especially on turns, only 1 cue to abduct RLE, pt tends to look down at feet, min A, practiced turns, stepping backwards.  12 steps with 1-2 rails with 1 cue to get R foot fully on step and then pt was careful to do this- close S with 2 rails, min a with 1 rail.   Therapeutic activity- w/c mobility, transfers- practiced squat and stand pivots. Sit to stands close S 75%, min A 25% with UE use. Squat transfers S.  Therapy Documentation Precautions:  Precautions Precautions: Fall Precaution Comments: very HOH; no battery for hearing aid this AM Restrictions Weight Bearing Restrictions: No Pain: Pain Assessment Pain Assessment: No/denies pain Pain Score: 0-No pain Mobility: Bed Mobility Supine to Sit: 6: Modified independent (Device/Increase time) Sitting - Scoot to Edge of Bed: 5: Supervision Sit to Supine: 6: Modified independent (Device/Increase time) Locomotion : Ambulation Ambulation/Gait Assistance: 4: Min assist Assistive device: Rolling walker Ambulation/Gait Assistance Details: Verbal cues for precautions/safety;Verbal cues for gait pattern;Verbal cues for safe use of DME/AE Ambulation/Gait Assistance Details: intermittent decreased stride on RLE, 1 vc in 180 ft to abduct RLE  Trunk/Postural Assessment : Cervical Assessment Cervical Assessment: Within Functional Limits Thoracic Assessment Thoracic Assessment: Within Functional Limits Lumbar Assessment Lumbar Assessment: Within Functional Limits Postural Control Postural Control: Deficits on evaluation Righting Reactions: decreased balance strategies, tends to fall R  Balance: Static  Sitting Balance Static Sitting - Balance Support: Feet supported Static Sitting - Level of Assistance: 7: Independent Dynamic Sitting Balance Dynamic Sitting - Level of Assistance: 5: Stand by assistance Static Standing Balance Static Standing - Level of Assistance: 5: Stand by assistance Dynamic Standing Balance Dynamic Standing - Balance Support: During functional activity Dynamic Standing - Level of Assistance: 4: Min assist     See FIM for current functional status  Therapy/Group: Individual Therapy  Michaelene Song 08/14/2011, 12:21 PM

## 2011-08-14 NOTE — Progress Notes (Signed)
Physical Therapy Note  Patient Details  Name: [name] MRN: [id number] Date of Birth: 1924/12/14 Today's Date: 08/14/2011  11:00-12:05 individual therapy pt denied pain.  Family education completed with pt's grandson for car, gait, gait outside, ramp, steps, ramp, and car transfer with rw and squat pivot transfers.. Overall min assist. Pt's grandson assisted pt safely.   Julian Reil 08/14/2011, 12:41 PM

## 2011-08-14 NOTE — Progress Notes (Signed)
Occupational Therapy Skilled  Intervention and Discharge Summary  Patient Details  Name: [name] MRN: [id number] Date of Birth: Jul 25, 1924  Today's Date: 08/14/2011 Time: 0905-1000 Time Calculation (min): 55 min    Pt seen for BADL retraining of toileting, bathing, and dressing with a focus on family education with patient's grandson who will be his main caregiver.  Pt showered in ADL apt. To practice using tub bench in tub.  Reviewed with grandson the importance of 24/7 supervision due his high fall risk and decreased memory/ safety awareness.  The grandson did a return demonstration of transfers.  Reviewed use of BSC for nighttime with squat pivot transfers. Pt may ambulate to toilet with close supervision during the day.  Patient has met 12 of 12 long term goals due to improved activity tolerance, improved balance, postural control, functional use of  RIGHT upper extremity, improved awareness and improved coordination.  Patient to discharge at overall Supervision level.  Patient's care partner is independent to provide the necessary physical and cognitive assistance at discharge.    Reasons goals not met: n/a  Recommendation:  Patient will benefit from ongoing skilled OT services in home health setting to continue to advance functional skills in the area of BADL.  Equipment: tub bench, 3 in 1  Reasons for discharge: treatment goals met  Patient/family agrees with progress made and goals achieved: Yes  OT Discharge Precautions/Restrictions  Restrictions Weight Bearing Restrictions: No   Pain Pain Assessment Pain Assessment: No/denies pain Pain Score: 0-No pain ADL ADL Eating: Independent Where Assessed-Eating: Wheelchair Grooming: Modified independent Where Assessed-Grooming: Wheelchair Upper Body Bathing: Modified independent Where Assessed-Upper Body Bathing: Shower Lower Body Bathing: Supervision/safety Where Assessed-Lower Body Bathing: Shower Upper Body  Dressing: Independent Where Assessed-Upper Body Dressing: Wheelchair Lower Body Dressing: Supervision/safety Where Assessed-Lower Body Dressing: Wheelchair Toileting: Supervision/safety Where Assessed-Toileting: Teacher, adult education: Close supervision Toilet Transfer Method: Ambulance person: Grab bars Tub/Shower Transfer: Close supervison Tub/Shower Equipment: Transfer tub bench;Grab bars;Walk in shower Walk-In Shower Transfer: Minimal assistance Film/video editor Method: Squat pivot ADL Comments: Pt overall needs min to mod instructional cues to sequence bathing task.  Mod assist for sit to stand from wheelchair, with max instructional cues for foot and hand placement.  Pt with feet way out in front of him for sit to stand and with wide base of support.  LOB to the right and posteriorly in standing when attempting to perform perianal washing.    Cognition Orientation Level: Oriented X4, impaired memory and safety awareness. Mobility  Bed Mobility Supine to Sit: 6: Modified independent (Device/Increase time) Sitting - Scoot to Edge of Bed: 5: Supervision Sit to Supine: 6: Modified independent (Device/Increase time)    Balance Static Sitting Balance Static Sitting - Level of Assistance: 7: Independent Dynamic Sitting Balance Dynamic Sitting - Level of Assistance: 5: Stand by assistance Static Standing Balance Static Standing - Level of Assistance: 5: Stand by assistance Dynamic Standing Balance Dynamic Standing - Balance Support: During functional activity Dynamic Standing - Level of Assistance: 5: Stand by assistance Extremity/Trunk Assessment RUE Assessment RUE Assessment:  (pt able to use RUE in functional tasks) LUE Assessment LUE Assessment:  (Pt can use LUE in functional tasks)  See FIM for current functional status  Keymon Mcelroy 08/14/2011, 12:13 PM

## 2011-08-15 MED ORDER — DSS 100 MG PO CAPS: 100.0000 mg | ORAL_CAPSULE | Freq: Two times a day (BID) | ORAL | Status: AC

## 2011-08-15 MED ORDER — POLYETHYLENE GLYCOL 3350 17 G PO PACK: 17.0000 g | PACK | Freq: Every day | ORAL | Status: AC

## 2011-08-15 NOTE — Progress Notes (Signed)
Social Work Discharge Note Discharge Note  The overall goal for the admission was met for:   Discharge location: Yes-HOME WITH DAUGHTER AND GRANDSON'S TO ASSIST WITH HIS CARE  Length of Stay: Yes-22 DAYS  Discharge activity level: Yes-MIN LEVEL  Home/community participation: Yes  Services provided included: MD, RD, PT, OT, SLP, RN, CM, TR, Pharmacy and SW  Financial Services: Private Insurance: [employer]-MEDICARE COMPLETE  Follow-up services arranged: Home Health: [facility]-PT,OT,RN, DME: [facility]-WHEELCHAIR,TUB BENCH,BSC, ROLLING WALKER and Patient/Family has no preference for HH/DME agencies  Comments (or additional information):FAMILY EDUCATION LIMITED DUE TO FAMILIES SCHEDULE  Patient/Family verbalized understanding of follow-up arrangements: Yes  Individual responsible for coordination of the follow-up plan: [name]-DAUGHTER Confirmed correct DME delivered: Lucy Chris 08/15/2011    Lucy Chris

## 2011-08-15 NOTE — Discharge Instructions (Signed)
Inpatient Rehab Discharge Instructions  [name] Discharge date and time:  08/15/11  Activities/Precautions/ Functional Status: Activity: activity as tolerated with supervision Diet: regular diet Wound Care: none needed Functional status:  ___ No restrictions     ___ Walk up steps independently _X__ 24/7 supervision/assistance   ___ Walk up steps with assistance ___ Intermittent supervision/assistance  ___ Bathe/dress independently _X__ Walk with walker    _X__ Bathe/dress with assistance ___ Walk Independently    ___ Shower independently ___ Walk with assistance    ___ Shower with assistance _X__ No alcohol     ___ Return to work/school ________  Special Instructions    :COMMUNITY REFERRALS UPON DISCHARGE:    Home Health:   PT, OT,SPT, RN   Agency: [facility] Phone: [phone] Date of last service:08/15/2011  Medical Equipment/Items Ordered:WHEELCHAIR, ROLLING WALKER, BSC, TUB BENCH  Agency/Supplier:[facility]   GENERAL COMMUNITY RESOURCES FOR PATIENT/FAMILY: Support Groups:[facility] SUPPORT GROUP   STROKE/TIA DISCHARGE INSTRUCTIONS SMOKING Cigarette smoking nearly doubles your risk of having a stroke & is the single most alterable risk factor  If you smoke or have smoked in the last 12 months, you are advised to quit smoking for your health.  Most of the excess cardiovascular risk related to smoking disappears within a year of stopping.  Ask you doctor about anti-smoking medications  [place] Quit Line: 1-800-QUIT NOW  Free Smoking Cessation Classes ([phone]  CHOLESTEROL Know your levels; limit fat & cholesterol in your diet  Lab Results  Component Value Date   CHOL 173 07/20/2011   HDL 68 07/20/2011   LDLCALC 91 07/20/2011   TRIG 69 07/20/2011   CHOLHDL 2.5 07/20/2011      Many patients benefit from treatment even if their cholesterol is at goal.  Goal: Total Cholesterol less than 160  Goal:  LDL less than 100  Goal:  HDL greater than 40  Goal:   Triglycerides less than 150  BLOOD PRESSURE American Stroke Association blood pressure target is less that 120/80 mm/Hg  Your discharge blood pressure is:  BP: 143/75 mmHg  Monitor your blood pressure  Limit your salt and alcohol intake  Many individuals will require more than one medication for high blood pressure  DIABETES (A1c is a blood sugar average for last 3 months) Goal A1c is under 7% (A1c is blood sugar average for last 3 months)  Diabetes: No known diagnosis of diabetes    Lab Results  Component Value Date   HGBA1C 5.8* 07/20/2011    Your A1c can be lowered with medications, healthy diet, and exercise.  Check your blood sugar as directed by your physician  Call your physician if you experience unexplained or low blood sugars.  PHYSICAL ACTIVITY/REHABILITATION Goal is 30 minutes at least 4 days per week    Activity decreases your risk of heart attack and stroke and makes your heart stronger.  It helps control your weight and blood pressure; helps you relax and can improve your mood.  Participate in a regular exercise program.  Talk with your doctor about the best form of exercise for you (dancing, walking, swimming, cycling).  DIET/WEIGHT Goal is to maintain a healthy weight  Your height is:  Height: 5\' 7"  (170.2 cm) Your current weight is: Weight: 80.6 kg (177 lb 11.1 oz) Your body Mass Index (BMI) is:  BMI (Calculated): 27.2   Following the type of diet specifically designed for you will help prevent another stroke.  Your goal Body Mass Index (BMI) is 19-24.  Healthy food habits can help reduce 3 risk factors for stroke:  High cholesterol, hypertension, and excess weight.        My questions have been answered and I understand these instructions. I will adhere to these goals and the provided educational materials after my discharge from the hospital.  Patient/Caregiver Signature _______________________________ Date __________  Clinician Signature  _______________________________________ Date __________  Please bring this form and your medication list with you to all your follow-up doctor's appointments.

## 2011-08-15 NOTE — Progress Notes (Signed)
Patient discharged at 62 with daughter to home. Discharge instruction given by Marissa Nestle, PA.  patient understood  no further questions asked.

## 2011-08-15 NOTE — Progress Notes (Signed)
Patient ID: [name], male   DOB: Jul 21, 1924, 78 y.o.   MRN: [id number] Subjective/Complaints: No pain , no breathing problem. Excited about D/C Review of Systems  Neurological: Positive for dizziness and tremors.  All other systems reviewed and are negative.    Objective: Vital Signs: Blood pressure 129/77, pulse 83, temperature 98.7 F (37.1 C), temperature source Oral, resp. rate 18, height 5\' 7"  (1.702 m), weight 80.6 kg (177 lb 11.1 oz), SpO2 95.00%. No results found. No results found for this or any previous visit (from the past 72 hour(s)).   HEENT: normal Cardio: RRR Resp: CTA B/L GI: BS positive Extremity:  Pulses positive and No Edema Skin:   Intact Neuro: Alert/Oriented, Cranial Nerve II-XII normal, Normal Sensory, Normal Motor and Other ataxia on finger to nose as well as heel to shin Still ataxic decreased finger nose finger and heel/shin testing on Right, dysarthric, HOH  Assessment/Plan: 1. Functional deficits secondary to R superior and lateral cerebellar infarct  Stable for D/CFIM: FIM - Bathing Bathing Steps Patient Completed: Chest;Right Arm;Left Arm;Abdomen;Front perineal area;Buttocks;Right lower leg (including foot);Left upper leg;Right upper leg;Left lower leg (including foot) Bathing: 5: Supervision: Safety issues/verbal cues  FIM - Upper Body Dressing/Undressing Upper body dressing/undressing steps patient completed: Thread/unthread right sleeve of pullover shirt/dresss;Thread/unthread left sleeve of pullover shirt/dress;Put head through opening of pull over shirt/dress;Pull shirt over trunk Upper body dressing/undressing: 6: More than reasonable amount of time FIM - Lower Body Dressing/Undressing Lower body dressing/undressing steps patient completed: Thread/unthread right underwear leg;Thread/unthread left underwear leg;Pull underwear up/down;Thread/unthread right pants leg;Thread/unthread left pants leg;Don/Doff right sock;Don/Doff left  sock;Don/Doff right shoe;Fasten/unfasten right shoe;Fasten/unfasten left shoe;Don/Doff left shoe Lower body dressing/undressing: 5: Supervision: Safety issues/verbal cues  FIM - Toileting Toileting steps completed by patient: Adjust clothing prior to toileting Toileting Assistive Devices: Grab bar or rail for support Toileting: 4: Steadying assist  FIM - Diplomatic Services operational officer Devices: Art gallery manager Transfers: 5-To toilet/BSC: Supervision (verbal cues/safety issues)  FIM - Banker Devices: Therapist, occupational: 6: Supine > Sit: No assist;6: Sit > Supine: No assist;5: Bed > Chair or W/C: Supervision (verbal cues/safety issues);5: Chair or W/C > Bed: Supervision (verbal cues/safety issues)  FIM - Locomotion: Wheelchair Distance: 160' including home environment Locomotion: Wheelchair: 5: Travels 150 ft or more: maneuvers on rugs and over door sills with supervision, cueing or coaxing FIM - Locomotion: Ambulation Locomotion: Ambulation Assistive Devices: Designer, industrial/product Ambulation/Gait Assistance: 4: Min assist Locomotion: Ambulation: 4: Travels 150 ft or more with minimal assistance (Pt.>75%)  Comprehension Comprehension Mode: Auditory Comprehension: 5-Follows basic conversation/direction: With no assist  Expression Expression Mode: Verbal Expression: 5-Expresses complex 90% of the time/cues < 10% of the time  Social Interaction Social Interaction: 5-Interacts appropriately 90% of the time - Needs monitoring or encouragement for participation or interaction.  Problem Solving Problem Solving: 5-Solves complex 90% of the time/cues < 10% of the time  Memory Memory: 6-More than reasonable amt of time  Medical Problem List and Plan:  1. DVT Prophylaxis/Anticoagulation: Pharmaceutical: Lovenox  2. Pain Management: N/A  3. Mood: pleasant and appropriate.  4. HTN: monitor with bid checks. Continue Norvasc and  benazepril.  5. Dizziness: Vestibular eval and treat. Monitor for worsening of symptoms with increased mobility. Pt continues to work through.   LOS (Days) 22 A FACE TO FACE EVALUATION WAS PERFORMED  [name] 08/15/2011, 8:22 AM

## 2011-08-21 NOTE — Discharge Summary (Signed)
[name] NO.:  [id number]  MEDICAL RECORD NO.:  [id number]  LOCATION:  4145                         FACILITY:  [facility]  PHYSICIAN:  Erick Colace, M.D.DATE OF BIRTH:  Sep 16, 1924  DATE OF ADMISSION:  07/24/2011 DATE OF DISCHARGE:  08/15/2011                              DISCHARGE SUMMARY   DISCHARGE DIAGNOSES: 1. Right cerebellar infarcts. 2. Hypertension. 3. Dizziness, resolved. 4. Asymptomatic C-diff infection, treated.  HISTORY OF PRESENT ILLNESS:  [name] is an 78 year old right- handed male with history of hypertension, admitted on Jul 19, 2011, with lethargy, slurred speech and difficulty walking.  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  MRA of brain done, revealed proximal occlusion of right SCA compatible with acute infarct.  The patient was started on aspirin for CVA prophylaxis. Therapies were initiated and the patient was evaluated by Rehab.  He was noted to have right lean with easy distractibility as well as poor attention to task as well as dizziness.  He was evaluated by Rehab team and we felt that he would benefit from a CIR program.  PAST MEDICAL HISTORY:  Hypertension and hard of hearing.  FUNCTIONAL HISTORY:  The patient was independent prior to admission.  He lives alone.  He uses a walker and was still driving.  FUNCTIONAL STATUS:  The patient is at modified independent to supervision level for bed mobility.  Mod-assist for sit to stand, max- assist for walking 100 feet with poor posture.  RECENT LABS:  Check of lytes from Aug 02, 2011, reveals sodium 142, potassium 4.4, chloride 105, CO2 29, BUN 15, creatinine 0.93, glucose 124.  CBC from Jul 25, 2011, reveals hemoglobin 12.0, hematocrit 35.1, white count 9.6, platelets 109.  Stool checked for C. diff on Aug 04, 2011, was noted to be positive.   Recheck of Aug 06, 2011 was negative.  HOSPITAL COURSE:  [name] was admitted to rehab on Jul 24, 2011, for inpatient therapies to consist of PT, OT, and speech therapy at least 3 hours 5 days.  Past-admission, physiatrist, rehab RN, and therapy team have worked together to provide customized collaborative interdisciplinary care.  Rehab RN has worked with the patient on bowel and bladder program as well as safety for fall prevention.  On the day After admission, nursing reported patient with loose stools. C. diff was checked and was noted to be positive.  This was likely colonization as the patient without any symptoms of C. diff or recent hospitalizations. Followup check of stools specimen showed evidence of eradication.  The patient's blood pressures were checked on b.i.d. basis.  These are running from 120s-150s systolic.  The patient was noted to be slightly dehydrated at time of admission and he was encouraged to push p.o. fluids.  Check of lytes by time of admission shows renal status to be much improved.  The patient has been continent of bowel and bladder.  During the patient's stay in Rehab, weekly team conferences were held to monitor the patient's progress, set goals, as well as discuss barriers to discharge.  Speech Therapy has been  working with the patient on speech language deficits.  The patient requires supervision with semantic cues to facilitate accuracy with naming and word finding during greeting as well as supervision with semantic cues to slow speech to help increase speech intelligibility.  Family education was done with grandson on how the patient's hearing loss affected his ability to self monitor his speech and language.  Lucila Maine was educated about using compensatory strategies such as quiet environment while talking, repeating information as well as facing the patient during conversation. OT has worked with the patient on self-care tasks.  The  patient is currently at supervision level for ADLs due to his decreased memory and safety.  He is showing improvement in activity tolerance, balance, postural control as well as improvement in functional use of right upper extremity.  The patient is currently at min-assist to supervision for squat pivot transfers.  He is at min-assist for ambulating household distances due to balance issues.  Further followup home health therapies to continue past discharge.  DISCHARGE MEDICATIONS: 1. Colace 100 mg p.o. b.i.d. 2. Lotrel 10-20 p.o. per day. 3. Coated aspirin 325 mg a day. 4. Senna-S 1 p.o. at bedtime p.r.n.  DIET:  Regular.  ACTIVITY:  As tolerated with supervision, walk with walker, bathe with assistance.  SPECIAL INSTRUCTIONS:  Home health PT, OT, speech therapy and RN per [facility].  FOLLOWUP:  The patient to follow up with Dr. Donette Larry for posthospital checkup in a week.  Follow up with Dr. Claudette Laws on September 11, 2011 at 10 a.m. for 10:30 appointment.     Delle Reining, P.A.   ______________________________ Erick Colace, M.D.    PL/MEDQ  D:  08/21/2011  T:  08/21/2011  Job:  [id number]  cc:   Georgann Housekeeper, MD Pramod P. Pearlean Brownie, MD

## 2011-08-28 ENCOUNTER — Inpatient Hospital Stay: Payer: PRIVATE HEALTH INSURANCE | Admitting: Physical Medicine & Rehabilitation

## 2011-09-11 ENCOUNTER — Inpatient Hospital Stay: Payer: PRIVATE HEALTH INSURANCE | Admitting: Physical Medicine & Rehabilitation

## 2011-09-11 ENCOUNTER — Encounter: Payer: PRIVATE HEALTH INSURANCE | Attending: Physical Medicine & Rehabilitation

## 2011-09-11 DIAGNOSIS — I69993 Ataxia following unspecified cerebrovascular disease: Secondary | ICD-10-CM | POA: Insufficient documentation

## 2011-09-15 ENCOUNTER — Encounter: Payer: Self-pay | Admitting: Physical Medicine & Rehabilitation

## 2011-09-15 ENCOUNTER — Ambulatory Visit (HOSPITAL_BASED_OUTPATIENT_CLINIC_OR_DEPARTMENT_OTHER): Payer: PRIVATE HEALTH INSURANCE | Admitting: Physical Medicine & Rehabilitation

## 2011-09-15 VITALS — BP 144/75 | HR 78 | Resp 14 | Ht 67.0 in | Wt 166.0 lb

## 2011-09-15 DIAGNOSIS — I69993 Ataxia following unspecified cerebrovascular disease: Secondary | ICD-10-CM

## 2011-09-15 DIAGNOSIS — I63341 Cerebral infarction due to thrombosis of right cerebellar artery: Secondary | ICD-10-CM

## 2011-09-15 DIAGNOSIS — I6330 Cerebral infarction due to thrombosis of unspecified cerebral artery: Secondary | ICD-10-CM

## 2011-09-15 DIAGNOSIS — R270 Ataxia, unspecified: Secondary | ICD-10-CM | POA: Insufficient documentation

## 2011-09-15 NOTE — Progress Notes (Signed)
Subjective:    Patient ID: [name], male    DOB: 24-Jul-1924, 78 y.o.   MRN: [id number]  HPI [name] is an 78 year old right-  handed male with history of hypertension, admitted on Jul 19, 2011, with  lethargy, slurred speech and difficulty walking. 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 MRA of  brain done, revealed proximal occlusion of right SCA compatible with  acute infarct. The patient was started on aspirin for CVA prophylaxis.  Therapies were initiated and the patient was evaluated by Rehab. He was  noted to have right lean with easy distractibility as well as poor  attention to task as well as dizziness. He was evaluated by Rehab team  and we felt that he would benefit from a CIR program.  PAST MEDICAL HISTORY: Hypertension and hard of hearing.  FUNCTIONAL HISTORY: The patient was independent prior to admission. He  lives alone. He uses a walker and was still driving.  Pain Inventory Average Pain 0 Pain Right Now 0 My pain is intermittent  In the last 24 hours, has pain interfered with the following? General activity 0 Relation with others 0 Enjoyment of life 0 What TIME of day is your pain at its worst? n/a Sleep (in general) Good  Pain is worse with: unsure Pain improves with: n/a Relief from Meds: 0  Mobility use a walker ability to climb steps?  yes do you drive?  no Do you have any goals in this area?  yes  Function I need assistance with the following:  bathing and meal prep  Neuro/Psych No problems in this area  Prior Studies Any changes since last visit?  no  Physicians involved in your care Any changes since last visit?  no   Family History  Problem Relation Age of Onset  . Diabetes Mother   . Heart disease Mother    History   Social History  . Marital Status: Widowed    Spouse Name: N/A    Number of  Children: N/A  . Years of Education: N/A   Social History Main Topics  . Smoking status: Never Smoker   . Smokeless tobacco: None  . Alcohol Use: No  . Drug Use: No  . Sexually Active:    Other Topics Concern  . None   Social History Narrative  . None   Past Surgical History  Procedure Date  . Ear tube removal    Past Medical History  Diagnosis Date  . Hypertension   . Difficulty hearing, right   . Stroke    BP 144/75  Pulse 78  Resp 14  Ht 5\' 7"  (1.702 m)  Wt 166 lb (75.297 kg)  BMI 26.00 kg/m2  SpO2 95%     Review of Systems  Musculoskeletal: Positive for gait problem.  All other systems reviewed and are negative.       Objective:   Physical Exam  Constitutional: He appears well-developed and well-nourished.  Neurological: He is alert. He has normal strength. Coordination and gait abnormal.       HOH  Psychiatric: He has a normal mood and affect.   Right hemi-ataxia on finger nose to finger testing as well as heel-to-shin testing Ambulates with a walker tends to lean towards the right.       Assessment & Plan:  1. Right cerebellar infarct with right hemiataxia and decreased balance. He would benefit from  additional outpatient PT OT to maximize mobility and functional usage of the upper extremity. I'll see him back in one month. Family can provide transportation Goals is to get him to a cane rather than with a walker

## 2011-09-15 NOTE — Patient Instructions (Signed)
You'll start outpatient physical therapy speech therapy occupational therapy and neuro rehabilitation. They will call you to set up an appointment. You will need to provide transportation. You'll see me in one month.

## 2011-10-23 ENCOUNTER — Ambulatory Visit: Payer: PRIVATE HEALTH INSURANCE | Admitting: Physical Medicine & Rehabilitation

## 2011-11-14 ENCOUNTER — Ambulatory Visit: Payer: PRIVATE HEALTH INSURANCE | Admitting: Physical Medicine & Rehabilitation

## 2011-11-30 ENCOUNTER — Telehealth: Payer: Self-pay | Admitting: Physical Medicine & Rehabilitation

## 2011-11-30 DIAGNOSIS — I639 Cerebral infarction, unspecified: Secondary | ICD-10-CM

## 2011-11-30 NOTE — Telephone Encounter (Signed)
Patient can now go to Neuro Rehab.  We need a new order.  Patient was not able to go before because of transportation issues.

## 2011-12-01 NOTE — Telephone Encounter (Signed)
Please order neuro rehab eval

## 2011-12-04 NOTE — Telephone Encounter (Signed)
Order placed

## 2011-12-26 ENCOUNTER — Telehealth: Payer: Self-pay | Admitting: Physical Medicine & Rehabilitation

## 2011-12-26 DIAGNOSIS — I6350 Cerebral infarction due to unspecified occlusion or stenosis of unspecified cerebral artery: Secondary | ICD-10-CM

## 2011-12-26 NOTE — Telephone Encounter (Signed)
Order has been placed.

## 2011-12-26 NOTE — Telephone Encounter (Signed)
[facility] needs order for OT.  They have order for PT.

## 2011-12-27 ENCOUNTER — Ambulatory Visit: Payer: PRIVATE HEALTH INSURANCE | Admitting: Occupational Therapy

## 2011-12-27 ENCOUNTER — Ambulatory Visit: Payer: PRIVATE HEALTH INSURANCE | Attending: Physical Medicine & Rehabilitation | Admitting: *Deleted

## 2011-12-27 DIAGNOSIS — R269 Unspecified abnormalities of gait and mobility: Secondary | ICD-10-CM | POA: Insufficient documentation

## 2011-12-27 DIAGNOSIS — IMO0001 Reserved for inherently not codable concepts without codable children: Secondary | ICD-10-CM | POA: Insufficient documentation

## 2011-12-27 DIAGNOSIS — I69998 Other sequelae following unspecified cerebrovascular disease: Secondary | ICD-10-CM | POA: Insufficient documentation

## 2011-12-27 DIAGNOSIS — R279 Unspecified lack of coordination: Secondary | ICD-10-CM | POA: Insufficient documentation

## 2012-01-09 ENCOUNTER — Ambulatory Visit: Payer: PRIVATE HEALTH INSURANCE | Admitting: Occupational Therapy

## 2012-01-09 ENCOUNTER — Ambulatory Visit: Payer: PRIVATE HEALTH INSURANCE | Admitting: *Deleted

## 2012-01-11 ENCOUNTER — Ambulatory Visit: Payer: PRIVATE HEALTH INSURANCE | Admitting: Occupational Therapy

## 2012-01-11 ENCOUNTER — Ambulatory Visit: Payer: PRIVATE HEALTH INSURANCE | Admitting: *Deleted

## 2012-01-16 ENCOUNTER — Ambulatory Visit: Payer: PRIVATE HEALTH INSURANCE | Admitting: Physical Therapy

## 2012-01-16 ENCOUNTER — Encounter: Payer: PRIVATE HEALTH INSURANCE | Admitting: Occupational Therapy

## 2012-01-18 ENCOUNTER — Ambulatory Visit: Payer: Medicare Other | Attending: Physical Medicine & Rehabilitation | Admitting: Occupational Therapy

## 2012-01-18 ENCOUNTER — Ambulatory Visit: Payer: Medicare Other | Admitting: *Deleted

## 2012-01-18 DIAGNOSIS — I69998 Other sequelae following unspecified cerebrovascular disease: Secondary | ICD-10-CM | POA: Insufficient documentation

## 2012-01-18 DIAGNOSIS — R279 Unspecified lack of coordination: Secondary | ICD-10-CM | POA: Insufficient documentation

## 2012-01-18 DIAGNOSIS — IMO0001 Reserved for inherently not codable concepts without codable children: Secondary | ICD-10-CM | POA: Insufficient documentation

## 2012-01-18 DIAGNOSIS — R269 Unspecified abnormalities of gait and mobility: Secondary | ICD-10-CM | POA: Insufficient documentation

## 2012-01-23 ENCOUNTER — Encounter: Payer: PRIVATE HEALTH INSURANCE | Admitting: Occupational Therapy

## 2012-01-23 ENCOUNTER — Ambulatory Visit: Payer: PRIVATE HEALTH INSURANCE | Admitting: *Deleted

## 2012-01-25 ENCOUNTER — Encounter: Payer: PRIVATE HEALTH INSURANCE | Admitting: Occupational Therapy

## 2012-01-25 ENCOUNTER — Ambulatory Visit: Payer: PRIVATE HEALTH INSURANCE | Admitting: Physical Therapy

## 2012-01-25 ENCOUNTER — Ambulatory Visit: Payer: Medicare Other | Admitting: Occupational Therapy

## 2012-01-25 ENCOUNTER — Ambulatory Visit: Payer: Medicare Other | Admitting: Physical Therapy

## 2012-01-30 ENCOUNTER — Ambulatory Visit: Payer: PRIVATE HEALTH INSURANCE | Admitting: Physical Therapy

## 2012-01-30 ENCOUNTER — Encounter: Payer: PRIVATE HEALTH INSURANCE | Admitting: Occupational Therapy

## 2012-02-01 ENCOUNTER — Ambulatory Visit: Payer: PRIVATE HEALTH INSURANCE | Admitting: Physical Therapy

## 2012-02-01 ENCOUNTER — Ambulatory Visit: Payer: Medicare Other | Admitting: *Deleted

## 2012-02-01 ENCOUNTER — Ambulatory Visit: Payer: Medicare Other | Admitting: Occupational Therapy

## 2012-02-13 ENCOUNTER — Ambulatory Visit: Payer: Medicare Other | Admitting: *Deleted

## 2012-02-13 ENCOUNTER — Ambulatory Visit: Payer: Medicare Other | Attending: Physical Medicine & Rehabilitation | Admitting: Occupational Therapy

## 2012-02-13 DIAGNOSIS — R279 Unspecified lack of coordination: Secondary | ICD-10-CM | POA: Insufficient documentation

## 2012-02-13 DIAGNOSIS — IMO0001 Reserved for inherently not codable concepts without codable children: Secondary | ICD-10-CM | POA: Insufficient documentation

## 2012-02-13 DIAGNOSIS — R269 Unspecified abnormalities of gait and mobility: Secondary | ICD-10-CM | POA: Insufficient documentation

## 2012-02-13 DIAGNOSIS — I69998 Other sequelae following unspecified cerebrovascular disease: Secondary | ICD-10-CM | POA: Insufficient documentation

## 2012-02-20 ENCOUNTER — Encounter: Payer: PRIVATE HEALTH INSURANCE | Admitting: Occupational Therapy

## 2012-02-20 ENCOUNTER — Ambulatory Visit: Payer: PRIVATE HEALTH INSURANCE | Admitting: Physical Therapy

## 2012-02-22 ENCOUNTER — Ambulatory Visit: Payer: Medicare Other | Admitting: Physical Therapy

## 2012-02-22 ENCOUNTER — Ambulatory Visit: Payer: Medicare Other | Admitting: Occupational Therapy

## 2012-02-27 ENCOUNTER — Ambulatory Visit: Payer: PRIVATE HEALTH INSURANCE | Admitting: *Deleted

## 2012-02-27 ENCOUNTER — Encounter: Payer: PRIVATE HEALTH INSURANCE | Admitting: Occupational Therapy

## 2012-06-23 ENCOUNTER — Emergency Department (HOSPITAL_COMMUNITY): Payer: Medicare Other

## 2012-06-23 ENCOUNTER — Observation Stay (HOSPITAL_COMMUNITY)
Admission: EM | Admit: 2012-06-23 | Discharge: 2012-06-26 | Disposition: A | Discharge status: Home / Self Care | Payer: Medicare Other | Attending: Family Medicine | Admitting: Family Medicine

## 2012-06-23 ENCOUNTER — Encounter (HOSPITAL_COMMUNITY): Payer: Self-pay | Admitting: Internal Medicine

## 2012-06-23 DIAGNOSIS — R82998 Other abnormal findings in urine: Secondary | ICD-10-CM | POA: Insufficient documentation

## 2012-06-23 DIAGNOSIS — D320 Benign neoplasm of cerebral meninges: Secondary | ICD-10-CM | POA: Insufficient documentation

## 2012-06-23 DIAGNOSIS — F29 Unspecified psychosis not due to a substance or known physiological condition: Secondary | ICD-10-CM | POA: Insufficient documentation

## 2012-06-23 DIAGNOSIS — I639 Cerebral infarction, unspecified: Secondary | ICD-10-CM

## 2012-06-23 DIAGNOSIS — J984 Other disorders of lung: Secondary | ICD-10-CM | POA: Insufficient documentation

## 2012-06-23 DIAGNOSIS — H9193 Unspecified hearing loss, bilateral: Secondary | ICD-10-CM

## 2012-06-23 DIAGNOSIS — R509 Fever, unspecified: Secondary | ICD-10-CM | POA: Insufficient documentation

## 2012-06-23 DIAGNOSIS — I63341 Cerebral infarction due to thrombosis of right cerebellar artery: Secondary | ICD-10-CM

## 2012-06-23 DIAGNOSIS — W19XXXA Unspecified fall, initial encounter: Secondary | ICD-10-CM | POA: Insufficient documentation

## 2012-06-23 DIAGNOSIS — G459 Transient cerebral ischemic attack, unspecified: Secondary | ICD-10-CM | POA: Insufficient documentation

## 2012-06-23 DIAGNOSIS — H9190 Unspecified hearing loss, unspecified ear: Secondary | ICD-10-CM | POA: Diagnosis present

## 2012-06-23 DIAGNOSIS — R0602 Shortness of breath: Secondary | ICD-10-CM | POA: Insufficient documentation

## 2012-06-23 DIAGNOSIS — I10 Essential (primary) hypertension: Principal | ICD-10-CM | POA: Diagnosis present

## 2012-06-23 DIAGNOSIS — I69993 Ataxia following unspecified cerebrovascular disease: Secondary | ICD-10-CM

## 2012-06-23 DIAGNOSIS — R7989 Other specified abnormal findings of blood chemistry: Secondary | ICD-10-CM | POA: Insufficient documentation

## 2012-06-23 DIAGNOSIS — W19XXXD Unspecified fall, subsequent encounter: Secondary | ICD-10-CM

## 2012-06-23 DIAGNOSIS — I498 Other specified cardiac arrhythmias: Secondary | ICD-10-CM | POA: Insufficient documentation

## 2012-06-23 DIAGNOSIS — E860 Dehydration: Secondary | ICD-10-CM | POA: Insufficient documentation

## 2012-06-23 DIAGNOSIS — A419 Sepsis, unspecified organism: Secondary | ICD-10-CM

## 2012-06-23 DIAGNOSIS — R296 Repeated falls: Secondary | ICD-10-CM

## 2012-06-23 DIAGNOSIS — Z8673 Personal history of transient ischemic attack (TIA), and cerebral infarction without residual deficits: Secondary | ICD-10-CM | POA: Insufficient documentation

## 2012-06-23 DIAGNOSIS — D329 Benign neoplasm of meninges, unspecified: Secondary | ICD-10-CM

## 2012-06-23 DIAGNOSIS — Y92009 Unspecified place in unspecified non-institutional (private) residence as the place of occurrence of the external cause: Secondary | ICD-10-CM | POA: Insufficient documentation

## 2012-06-23 DIAGNOSIS — I6350 Cerebral infarction due to unspecified occlusion or stenosis of unspecified cerebral artery: Secondary | ICD-10-CM

## 2012-06-23 DIAGNOSIS — R5381 Other malaise: Secondary | ICD-10-CM | POA: Insufficient documentation

## 2012-06-23 DIAGNOSIS — N390 Urinary tract infection, site not specified: Secondary | ICD-10-CM

## 2012-06-23 LAB — CBC WITH DIFFERENTIAL/PLATELET
Basophils Absolute: 0 10*3/uL (ref 0.0–0.1)
Basophils Relative: 0 % (ref 0–1)
Eosinophils Absolute: 0 10*3/uL (ref 0.0–0.7)
Eosinophils Relative: 0 % (ref 0–5)
HCT: 39.3 % (ref 39.0–52.0)
Hemoglobin: 13.9 g/dL (ref 13.0–17.0)
Lymphocytes Relative: 3 % — ABNORMAL LOW (ref 12–46)
Lymphs Abs: 0.3 10*3/uL — ABNORMAL LOW (ref 0.7–4.0)
MCH: 31.4 pg (ref 26.0–34.0)
MCHC: 35.4 g/dL (ref 30.0–36.0)
MCV: 88.7 fL (ref 78.0–100.0)
Monocytes Absolute: 0.4 10*3/uL (ref 0.1–1.0)
Monocytes Relative: 4 % (ref 3–12)
Neutro Abs: 9.4 10*3/uL — ABNORMAL HIGH (ref 1.7–7.7)
Neutrophils Relative %: 93 % — ABNORMAL HIGH (ref 43–77)
Platelets: 147 10*3/uL — ABNORMAL LOW (ref 150–400)
RBC: 4.43 MIL/uL (ref 4.22–5.81)
RDW: 13.3 % (ref 11.5–15.5)
WBC: 10 10*3/uL (ref 4.0–10.5)

## 2012-06-23 LAB — URINE MICROSCOPIC-ADD ON: Urine-Other: NONE SEEN

## 2012-06-23 LAB — URINALYSIS, ROUTINE W REFLEX MICROSCOPIC
Bilirubin Urine: NEGATIVE
Glucose, UA: NEGATIVE mg/dL
Ketones, ur: NEGATIVE mg/dL
Nitrite: NEGATIVE
Protein, ur: 100 mg/dL — AB
Specific Gravity, Urine: 1.02 (ref 1.005–1.030)
Urobilinogen, UA: 1 mg/dL (ref 0.0–1.0)
pH: 6.5 (ref 5.0–8.0)

## 2012-06-23 LAB — COMPREHENSIVE METABOLIC PANEL
ALT: 64 U/L — ABNORMAL HIGH (ref 0–53)
AST: 107 U/L — ABNORMAL HIGH (ref 0–37)
Albumin: 3.6 g/dL (ref 3.5–5.2)
Alkaline Phosphatase: 72 U/L (ref 39–117)
BUN: 18 mg/dL (ref 6–23)
CO2: 28 mEq/L (ref 19–32)
Calcium: 11.1 mg/dL — ABNORMAL HIGH (ref 8.4–10.5)
Chloride: 104 mEq/L (ref 96–112)
Creatinine, Ser: 1.12 mg/dL (ref 0.50–1.35)
GFR calc Af Amer: 67 mL/min — ABNORMAL LOW (ref >90)
GFR calc non Af Amer: 58 mL/min — ABNORMAL LOW (ref >90)
Glucose, Bld: 135 mg/dL — ABNORMAL HIGH (ref 70–99)
Potassium: 4 mEq/L (ref 3.5–5.1)
Sodium: 141 mEq/L (ref 135–145)
Total Bilirubin: 0.5 mg/dL (ref 0.3–1.2)
Total Protein: 7.6 g/dL (ref 6.0–8.3)

## 2012-06-23 LAB — PRO B NATRIURETIC PEPTIDE: Pro B Natriuretic peptide (BNP): 1102 pg/mL — ABNORMAL HIGH (ref 0–450)

## 2012-06-23 LAB — CG4 I-STAT (LACTIC ACID): Lactic Acid, Venous: 1.64 mmol/L (ref 0.5–2.2)

## 2012-06-23 MED ORDER — SODIUM CHLORIDE 0.9 % IV SOLN: INTRAVENOUS | Status: DC

## 2012-06-23 MED ORDER — DEXTROSE 5 % IV SOLN
1.0000 g | Freq: Once | INTRAVENOUS | Status: DC
  Filled 2012-06-23 (×2): qty 10

## 2012-06-23 MED ORDER — SODIUM CHLORIDE 0.9 % IV BOLUS (SEPSIS)
250.0000 mL | Freq: Once | INTRAVENOUS | Status: AC
  Administered 2012-06-23: 250 mL via INTRAVENOUS

## 2012-06-23 NOTE — ED Notes (Signed)
Pt transported to radiology.

## 2012-06-23 NOTE — ED Notes (Signed)
Phlebotomy at bedside.

## 2012-06-23 NOTE — ED Notes (Signed)
Toileting offered, Pt unable to void at this time.

## 2012-06-23 NOTE — ED Provider Notes (Signed)
History     CSN: [id number]  Arrival date & time 06/23/12  1721   First MD Initiated Contact with Patient 06/23/12 1753      Chief Complaint  Patient presents with  . Fall    (Consider location/radiation/quality/duration/timing/severity/associated sxs/prior treatment) Patient is a 79 y.o. male presenting with fall. The history is provided by the patient and a relative.  Fall Associated symptoms include a fever. Pertinent negatives include no abdominal pain, no nausea and no vomiting.   patient found on the floor at home. Lives with his daughter they discovered him on the floor this morning he was noted to be in his bed earlier. So he has not been on the floor since 3:00 this morning as noted in the triage notes. They thought maybe that he just wasn't feeling well then go back and sleeps more than they got him up he was unstable on his feet seemed to be dragging his right foot did not seem to be his usual self. He also felt very warm. Recently seen by his primary care doctor and was started on antibiotic for urinary tract infection they took the first dose yesterday that antibiotic was Septra.  Past Medical History  Diagnosis Date  . Hypertension   . Difficulty hearing, right   . Stroke     Past Surgical History  Procedure Laterality Date  . Ear tube removal      Family History  Problem Relation Age of Onset  . Diabetes Mother   . Heart disease Mother     History  Substance Use Topics  . Smoking status: Never Smoker   . Smokeless tobacco: Not on file  . Alcohol Use: No      Review of Systems  Constitutional: Positive for fever and fatigue.  HENT: Negative for congestion.   Eyes: Negative for redness.  Respiratory: Negative for shortness of breath.   Cardiovascular: Negative for chest pain.  Gastrointestinal: Negative for nausea, vomiting and abdominal pain.  Genitourinary: Negative for dysuria.  Musculoskeletal: Negative for back pain.  Skin: Negative for rash.   Neurological: Negative for weakness.  Hematological: Does not bruise/bleed easily.  Psychiatric/Behavioral: Negative for confusion.    Allergies  Review of patient's allergies indicates no known allergies.  Home Medications   Current Outpatient Rx  Name  Route  Sig  Dispense  Refill  . amLODipine-benazepril (LOTREL) 10-20 MG per capsule   Oral   Take 1 capsule by mouth daily.         Marland Kitchen aspirin 325 MG tablet   Oral   Take 325 mg by mouth daily.         . sennosides-docusate sodium (SENOKOT-S) 8.6-50 MG tablet   Oral   Take 1 tablet by mouth at bedtime as needed. For constipation         . sulfamethoxazole-trimethoprim (BACTRIM,SEPTRA) 400-80 MG per tablet   Oral   Take 1 tablet by mouth 2 (two) times daily.           BP 153/82  Pulse 107  Temp(Src) 102.9 F (39.4 C) (Oral)  Resp 25  SpO2 95%  Physical Exam  Nursing note and vitals reviewed. Constitutional: He appears well-developed and well-nourished. No distress.  HENT:  Head: Normocephalic and atraumatic.  Mouth/Throat: Oropharynx is clear and moist.  Eyes: Conjunctivae and EOM are normal. Pupils are equal, round, and reactive to light.  Neck: Normal range of motion.  Cardiovascular: Normal rate, regular rhythm and normal heart sounds.   No murmur  heard. Pulmonary/Chest: Effort normal and breath sounds normal.  Abdominal: Soft. Bowel sounds are normal. There is no tenderness.  Musculoskeletal: Normal range of motion.  Neurological: He is alert. No cranial nerve deficit. He exhibits normal muscle tone. Coordination normal.  Skin: Skin is warm. No rash noted.    ED Course  Procedures (including critical care time)  Labs Reviewed  CBC WITH DIFFERENTIAL - Abnormal; Notable for the following:    Platelets 147 (*)    Neutrophils Relative 93 (*)    Neutro Abs 9.4 (*)    Lymphocytes Relative 3 (*)    Lymphs Abs 0.3 (*)    All other components within normal limits  COMPREHENSIVE METABOLIC PANEL -  Abnormal; Notable for the following:    Glucose, Bld 135 (*)    Calcium 11.1 (*)    AST 107 (*)    ALT 64 (*)    GFR calc non Af Amer 58 (*)    GFR calc Af Amer 67 (*)    All other components within normal limits  URINALYSIS, ROUTINE W REFLEX MICROSCOPIC - Abnormal; Notable for the following:    Color, Urine AMBER (*)    APPearance CLOUDY (*)    Hgb urine dipstick LARGE (*)    Protein, ur 100 (*)    Leukocytes, UA MODERATE (*)    All other components within normal limits  PRO B NATRIURETIC PEPTIDE - Abnormal; Notable for the following:    Pro B Natriuretic peptide (BNP) 1102.0 (*)    All other components within normal limits  URINE CULTURE  CULTURE, BLOOD (ROUTINE X 2)  CULTURE, BLOOD (ROUTINE X 2)  URINE MICROSCOPIC-ADD ON  CG4 I-STAT (LACTIC ACID)   Dg Chest 2 View  06/23/2012  *RADIOLOGY REPORT*  Clinical Data: Shortness of breath.  The patient fell.  CHEST - 2 VIEW  Comparison: Chest x-ray dated 07/19/2011  Findings: There is a 17 mm nodular appearing density at the medial aspect of the right lung apex.  There is also a second area of vague in density laterally in the right upper lobe measuring 10 mm in diameter.  Lungs are otherwise clear.  There is tortuosity of the thoracic aorta.  Overall heart size and vascularity are normal.  No acute osseous abnormalities.  No effusions.  IMPRESSION: Two possible nodules in the right upper lobe.   Original Report Authenticated By: Francene Boyers, M.D.    Ct Head Wo Contrast  06/23/2012  *RADIOLOGY REPORT*  Clinical Data: Patient found down.  CT HEAD WITHOUT CONTRAST  Technique:  Contiguous axial images were obtained from the base of the skull through the vertex without contrast.  Comparison: Brain MRI and head CT 07/19/2011.  Findings: Extensive encephalomalacia in the right cerebellum is consistent with evolutionary change of the infarct seen on the comparison MRI.  Remote right PCA territory infarct is also identified. There is extensive  chronic microvascular ischemic change.  Left inferior frontal lobe meningioma with surrounding edema is unchanged measuring 2.7 cm in diameter.   No evidence of acute abnormality including new infarct, hemorrhage, midline shift, hydrocephalus or pneumocephalus.  IMPRESSION:  1.  No acute finding.  2.  Multiple remote infarcts and extensive chronic microvascular ischemic disease.  3.  Left frontal meningioma, unchanged.   Original Report Authenticated By: Holley Dexter, M.D.    Results for orders placed during the hospital encounter of 06/23/12  CBC WITH DIFFERENTIAL      Result Value Range   WBC 10.0  4.0 - 10.5  K/uL   RBC 4.43  4.22 - 5.81 MIL/uL   Hemoglobin 13.9  13.0 - 17.0 g/dL   HCT 16.1  09.6 - 04.5 %   MCV 88.7  78.0 - 100.0 fL   MCH 31.4  26.0 - 34.0 pg   MCHC 35.4  30.0 - 36.0 g/dL   RDW 40.9  81.1 - 91.4 %   Platelets 147 (*) 150 - 400 K/uL   Neutrophils Relative 93 (*) 43 - 77 %   Neutro Abs 9.4 (*) 1.7 - 7.7 K/uL   Lymphocytes Relative 3 (*) 12 - 46 %   Lymphs Abs 0.3 (*) 0.7 - 4.0 K/uL   Monocytes Relative 4  3 - 12 %   Monocytes Absolute 0.4  0.1 - 1.0 K/uL   Eosinophils Relative 0  0 - 5 %   Eosinophils Absolute 0.0  0.0 - 0.7 K/uL   Basophils Relative 0  0 - 1 %   Basophils Absolute 0.0  0.0 - 0.1 K/uL  COMPREHENSIVE METABOLIC PANEL      Result Value Range   Sodium 141  135 - 145 mEq/L   Potassium 4.0  3.5 - 5.1 mEq/L   Chloride 104  96 - 112 mEq/L   CO2 28  19 - 32 mEq/L   Glucose, Bld 135 (*) 70 - 99 mg/dL   BUN 18  6 - 23 mg/dL   Creatinine, Ser 7.82  0.50 - 1.35 mg/dL   Calcium 95.6 (*) 8.4 - 10.5 mg/dL   Total Protein 7.6  6.0 - 8.3 g/dL   Albumin 3.6  3.5 - 5.2 g/dL   AST 213 (*) 0 - 37 U/L   ALT 64 (*) 0 - 53 U/L   Alkaline Phosphatase 72  39 - 117 U/L   Total Bilirubin 0.5  0.3 - 1.2 mg/dL   GFR calc non Af Amer 58 (*) >90 mL/min   GFR calc Af Amer 67 (*) >90 mL/min  URINALYSIS, ROUTINE W REFLEX MICROSCOPIC      Result Value Range   Color, Urine  AMBER (*) YELLOW   APPearance CLOUDY (*) CLEAR   Specific Gravity, Urine 1.020  1.005 - 1.030   pH 6.5  5.0 - 8.0   Glucose, UA NEGATIVE  NEGATIVE mg/dL   Hgb urine dipstick LARGE (*) NEGATIVE   Bilirubin Urine NEGATIVE  NEGATIVE   Ketones, ur NEGATIVE  NEGATIVE mg/dL   Protein, ur 086 (*) NEGATIVE mg/dL   Urobilinogen, UA 1.0  0.0 - 1.0 mg/dL   Nitrite NEGATIVE  NEGATIVE   Leukocytes, UA MODERATE (*) NEGATIVE  PRO B NATRIURETIC PEPTIDE      Result Value Range   Pro B Natriuretic peptide (BNP) 1102.0 (*) 0 - 450 pg/mL  URINE MICROSCOPIC-ADD ON      Result Value Range   Squamous Epithelial / LPF RARE  RARE   WBC, UA 21-50  <3 WBC/hpf   RBC / HPF 3-6  <3 RBC/hpf   Urine-Other NO BACTERIA SEEN    CG4 I-STAT (LACTIC ACID)      Result Value Range   Lactic Acid, Venous 1.64  0.5 - 2.2 mmol/L    Date: 06/23/2012  Rate: 112  Rhythm: sinus tachycardia  QRS Axis: normal  Intervals: normal  ST/T Wave abnormalities: nonspecific ST/T changes  Conduction Disutrbances:none  Narrative Interpretation:   Old EKG Reviewed: none available     1. Urosepsis   2. CVA (cerebral infarction)   3. Fall at home, initial encounter  MDM  Patient with urinary tract infection and confusion. Patient did have a fall at home. Trouble walking following that event. Workup here is negative for any evidence of acute CVA. Urine was abnormal urine cultures done blood cultures done lactic acid is not consistent with sepsis. Patient started on Rocephin 1 g IV piggyback for the urinary tract infection. Here he has remained alert no acute distress did have a fever which probably explains the tachycardia. Patient will require admission. Discuss with hospitalist team they will admit.        Shelda Jakes, MD 06/23/12 [phone]

## 2012-06-23 NOTE — ED Notes (Signed)
MD at bedside. 

## 2012-06-23 NOTE — ED Notes (Addendum)
Pt presents to department for fall. Family found him laying face down beside bed this morning @3am . Per family pt has been very weak and sleepy today. Also states he was "dragging" his R foot when attempting to walk to bathroom. Pt able to move all extremities upon arrival to ED. No slurred speech. No facial droop. He is alert and can answer questions appropriately. Respirations unlabored. Family member states he was recently placed on antibiotics for possible UTI.

## 2012-06-24 DIAGNOSIS — I6330 Cerebral infarction due to thrombosis of unspecified cerebral artery: Secondary | ICD-10-CM

## 2012-06-24 DIAGNOSIS — I10 Essential (primary) hypertension: Secondary | ICD-10-CM

## 2012-06-24 DIAGNOSIS — H9190 Unspecified hearing loss, unspecified ear: Secondary | ICD-10-CM

## 2012-06-24 DIAGNOSIS — I69993 Ataxia following unspecified cerebrovascular disease: Secondary | ICD-10-CM

## 2012-06-24 MED ORDER — ONDANSETRON HCL 4 MG/2ML IJ SOLN: 4.0000 mg | Freq: Four times a day (QID) | INTRAMUSCULAR | Status: DC | PRN

## 2012-06-24 MED ORDER — DEXTROSE 5 % IV SOLN
1.0000 g | INTRAVENOUS | Status: DC
  Administered 2012-06-24: 1 g via INTRAVENOUS
  Filled 2012-06-24: qty 10

## 2012-06-24 MED ORDER — ACETAMINOPHEN 325 MG PO TABS: 650.0000 mg | ORAL_TABLET | Freq: Four times a day (QID) | ORAL | Status: DC | PRN

## 2012-06-24 MED ORDER — SODIUM CHLORIDE 0.9 % IV SOLN
INTRAVENOUS | Status: AC
  Administered 2012-06-24: 02:00:00 via INTRAVENOUS
  Administered 2012-06-24: 100 mL/h via INTRAVENOUS
  Administered 2012-06-25: 02:00:00 via INTRAVENOUS

## 2012-06-24 MED ORDER — SENNOSIDES-DOCUSATE SODIUM 8.6-50 MG PO TABS: 1.0000 | ORAL_TABLET | Freq: Every evening | ORAL | Status: DC | PRN

## 2012-06-24 MED ORDER — ONDANSETRON HCL 4 MG PO TABS: 4.0000 mg | ORAL_TABLET | Freq: Four times a day (QID) | ORAL | Status: DC | PRN

## 2012-06-24 MED ORDER — ENOXAPARIN SODIUM 40 MG/0.4ML ~~LOC~~ SOLN
40.0000 mg | SUBCUTANEOUS | Status: DC
  Administered 2012-06-24 – 2012-06-26 (×3): 40 mg via SUBCUTANEOUS
  Filled 2012-06-24 (×3): qty 0.4

## 2012-06-24 MED ORDER — ASPIRIN 325 MG PO TABS
325.0000 mg | ORAL_TABLET | Freq: Every day | ORAL | Status: DC
  Administered 2012-06-24 – 2012-06-25 (×2): 325 mg via ORAL
  Filled 2012-06-24 (×2): qty 1

## 2012-06-24 NOTE — H&P (Signed)
[facility] History and Physical  [name] ZOX:[id number] DOB: December 17, 1924    PCP:   Quitman Livings, MD   Chief Complaint: weakness and UTI.  HPI: [name] is an 79 y.o. male with hx of HTN, prior CVA, lives at home with daughter and grandson, found on the floor having difficulty getting up.  He had a prior CVA and was having residual right sided weakness and slurred speech, but he was found to be leaning more trying to stand up.  He did not have any seizure activities, nausea, vomiting, fever, chills, cough, black or bloody stool.  Evaluation in the ER showed UA with 21-50 WBC, and head CT showed old CVA with microvascular disease, and a meningioma unchanged from prior study.  His WBC is 10K, and he has normal renal fx tests.  Hospitalist was asked to admit him for dehydration, weakness, and UTI.    Rewiew of Systems:  Difficult to obtain because of severe hearing loss, but he has no chest pain or shortness of breath.   Past Medical History  Diagnosis Date  . Hypertension   . Difficulty hearing, right   . Stroke     Past Surgical History  Procedure Laterality Date  . Ear tube removal      Medications:  HOME MEDS: Prior to Admission medications   Medication Sig Start Date End Date Taking? Authorizing Provider  amLODipine-benazepril (LOTREL) 10-20 MG per capsule Take 1 capsule by mouth daily.   Yes Historical Provider, MD  aspirin 325 MG tablet Take 325 mg by mouth daily.   Yes Historical Provider, MD  sennosides-docusate sodium (SENOKOT-S) 8.6-50 MG tablet Take 1 tablet by mouth at bedtime as needed. For constipation   Yes Historical Provider, MD  sulfamethoxazole-trimethoprim (BACTRIM,SEPTRA) 400-80 MG per tablet Take 1 tablet by mouth 2 (two) times daily.   Yes Historical Provider, MD     Allergies:  No Known Allergies  Social History:   reports that he has never smoked. He does not have any smokeless tobacco history on file. He reports that he does not  drink alcohol or use illicit drugs.  Family History: Family History  Problem Relation Age of Onset  . Diabetes Mother   . Heart disease Mother      Physical Exam: Filed Vitals:   06/23/12 2300 06/23/12 2330 06/24/12 0000 06/24/12 0030  BP: 120/67 127/67 127/66 121/67  Pulse: 99 100 98 97  Temp:      TempSrc:      Resp: 22 24 25 24   SpO2: 99% 97% 97% 98%   Blood pressure 121/67, pulse 97, temperature 102.9 F (39.4 C), temperature source Oral, resp. rate 24, SpO2 98.00%.  GEN:  Pleasant  patient lying in the stretcher in no acute distress; cooperative with exam. PSYCH:  alert and oriented x4; does not appear anxious or depressed; affect is appropriate. HEENT: Mucous membranes pink and anicteric; PERRLA; EOM intact; no cervical lymphadenopathy nor thyromegaly or carotid bruit; no JVD; There were no stridor. Neck is very supple. Breasts:: Not examined CHEST WALL: No tenderness CHEST: Normal respiration, clear to auscultation bilaterally.  HEART: Regular rate and rhythm.  There are no murmur, rub, or gallops.   BACK: No kyphosis or scoliosis; no CVA tenderness ABDOMEN: soft and non-tender; no masses, no organomegaly, normal abdominal bowel sounds; no pannus; no intertriginous candida. There is no rebound and no distention. Rectal Exam: Not done EXTREMITIES: No bone or joint deformity; age-appropriate arthropathy of the hands and knees; no edema;  no ulcerations.  There is no calf tenderness. Genitalia: not examined PULSES: 2+ and symmetric SKIN: Normal hydration no rash or ulceration CNS: Cranial nerves 2-12 grossly intact no focal lateralizing neurologic deficit.  Speech is slurred.; uvula elevated with phonation, facial symmetry and tongue midline.  barbinski is negative and strengths are equaled bilaterally.  No sensory loss.   Labs on Admission:  Basic Metabolic Panel:  Recent Labs Lab 06/23/12 1741  NA 141  K 4.0  CL 104  CO2 28  GLUCOSE 135*  BUN 18  CREATININE  1.12  CALCIUM 11.1*   Liver Function Tests:  Recent Labs Lab 06/23/12 1741  AST 107*  ALT 64*  ALKPHOS 72  BILITOT 0.5  PROT 7.6  ALBUMIN 3.6   No results found for this basename: LIPASE, AMYLASE,  in the last 168 hours No results found for this basename: AMMONIA,  in the last 168 hours CBC:  Recent Labs Lab 06/23/12 1741  WBC 10.0  NEUTROABS 9.4*  HGB 13.9  HCT 39.3  MCV 88.7  PLT 147*   Cardiac Enzymes: No results found for this basename: CKTOTAL, CKMB, CKMBINDEX, TROPONINI,  in the last 168 hours  CBG: No results found for this basename: GLUCAP,  in the last 168 hours   Radiological Exams on Admission: Dg Chest 2 View  06/23/2012  *RADIOLOGY REPORT*  Clinical Data: Shortness of breath.  The patient fell.  CHEST - 2 VIEW  Comparison: Chest x-ray dated 07/19/2011  Findings: There is a 17 mm nodular appearing density at the medial aspect of the right lung apex.  There is also a second area of vague in density laterally in the right upper lobe measuring 10 mm in diameter.  Lungs are otherwise clear.  There is tortuosity of the thoracic aorta.  Overall heart size and vascularity are normal.  No acute osseous abnormalities.  No effusions.  IMPRESSION: Two possible nodules in the right upper lobe.   Original Report Authenticated By: Francene Boyers, M.D.    Ct Head Wo Contrast  06/23/2012  *RADIOLOGY REPORT*  Clinical Data: Patient found down.  CT HEAD WITHOUT CONTRAST  Technique:  Contiguous axial images were obtained from the base of the skull through the vertex without contrast.  Comparison: Brain MRI and head CT 07/19/2011.  Findings: Extensive encephalomalacia in the right cerebellum is consistent with evolutionary change of the infarct seen on the comparison MRI.  Remote right PCA territory infarct is also identified. There is extensive chronic microvascular ischemic change.  Left inferior frontal lobe meningioma with surrounding edema is unchanged measuring 2.7 cm in  diameter.   No evidence of acute abnormality including new infarct, hemorrhage, midline shift, hydrocephalus or pneumocephalus.  IMPRESSION:  1.  No acute finding.  2.  Multiple remote infarcts and extensive chronic microvascular ischemic disease.  3.  Left frontal meningioma, unchanged.   Original Report Authenticated By: Holley Dexter, M.D.      Assessment/Plan Present on Admission:  . Hearing loss . HTN (hypertension) UTI Weakness Dehydration.  PLAN:  He is clinically slightly dehydrated, and was having a UTI.  Will admit him for gentle IVF and IV Rocephin.  I will continue his medication, and hold his BP meds since it is a bit low.  Please resume them if his BP is OK tomorrow.  He is otherwise stable, full code, and will be admitted to [facility] service.  Thank you for allowing me to partake in the care of your nice patient.  Other plans as  per orders.  Code Status: FULL Unk Lightning, MD. [facility] Pager [phone] 7pm to 7am.  06/24/2012, 1:00 AM

## 2012-06-24 NOTE — Progress Notes (Signed)
Utilization review completed.  

## 2012-06-24 NOTE — Progress Notes (Signed)
[facility] PROGRESS NOTE  [name] EAV:[id number] DOB: 1924/12/25 DOA: 06/23/2012 PCP: Quitman Livings, MD  Assessment/Plan  HTN, blood pressures wnl today.  -  Trend today and restart BP medications tomorrow if BP tolerates  UTI   -  Continue ceftriaxone -  F/u urine culture    Weakness -  PT/OT consults    CVA, stable with residual right sided weakness which has improved.  Likely due to UTI.   -  On ASA.   -  Will discuss possibility of statin  Hypercalcemia, likely due to dehydration yesterday -  Repeat electrolytes in AM  Mild thrombocytopenia, likely due to acute illness -  Repeat in AM   Diet:  Healthy heart Access:  PIV IVF:  NS at 65ml/h until 6am tomorrow Proph:  lovenox  Code Status: full  Family Communication: spoke with patient only Disposition Plan: pending medically stable.    Consultants:  none  Procedures:  Ct head  Antibiotics:  ceftriaxone   HPI/Subjective:  Denies fevers, chills, headache.  Denies chest pain and shortness of breath.  Denies nausea, vomiting, diarrhea, constipation.  Has been eating well.  Has not been out of bed.  Denies pain.  FAmily reported patient had problems moving his right foot recently.    Objective: Filed Vitals:   06/24/12 0120 06/24/12 0500 06/24/12 1025 06/24/12 1300  BP: 147/79 135/76 122/67 122/72  Pulse: 101 106 105 80  Temp: 99.5 F (37.5 C) 100 F (37.8 C) 99.2 F (37.3 C) 99.6 F (37.6 C)  TempSrc: Oral Oral Oral Oral  Resp: 20 20 20 20   Height: 6\' 1"  (1.854 m)     Weight: 82.237 kg (181 lb 4.8 oz)     SpO2: 97% 98% 97% 97%    Intake/Output Summary (Last 24 hours) at 06/24/12 1339 Last data filed at 06/24/12 1254  Gross per 24 hour  Intake 621.67 ml  Output    300 ml  Net 321.67 ml   Filed Weights   06/24/12 0120  Weight: 82.237 kg (181 lb 4.8 oz)    Exam:   General:  AAM, No acute distress  HEENT:  NCAT, MMM  Cardiovascular:  RRR, nl S1, S2 no mrg, 2+ pulses, warm  extremities  Respiratory:  CTAB, no increased WOB  Abdomen:   NABS, soft, NT/ND  MSK:   Normal tone and bulk, trace LEE  Neuro:  Grossly intact  Data Reviewed: Basic Metabolic Panel:  Recent Labs Lab 06/23/12 1741  NA 141  K 4.0  CL 104  CO2 28  GLUCOSE 135*  BUN 18  CREATININE 1.12  CALCIUM 11.1*   Liver Function Tests:  Recent Labs Lab 06/23/12 1741  AST 107*  ALT 64*  ALKPHOS 72  BILITOT 0.5  PROT 7.6  ALBUMIN 3.6   No results found for this basename: LIPASE, AMYLASE,  in the last 168 hours No results found for this basename: AMMONIA,  in the last 168 hours CBC:  Recent Labs Lab 06/23/12 1741  WBC 10.0  NEUTROABS 9.4*  HGB 13.9  HCT 39.3  MCV 88.7  PLT 147*   Cardiac Enzymes: No results found for this basename: CKTOTAL, CKMB, CKMBINDEX, TROPONINI,  in the last 168 hours BNP (last 3 results)  Recent Labs  06/23/12 1741  PROBNP 1102.0*   CBG: No results found for this basename: GLUCAP,  in the last 168 hours  No results found for this or any previous visit (from the past 240 hour(s)).   Studies: Dg  Chest 2 View  06/23/2012  *RADIOLOGY REPORT*  Clinical Data: Shortness of breath.  The patient fell.  CHEST - 2 VIEW  Comparison: Chest x-ray dated 07/19/2011  Findings: There is a 17 mm nodular appearing density at the medial aspect of the right lung apex.  There is also a second area of vague in density laterally in the right upper lobe measuring 10 mm in diameter.  Lungs are otherwise clear.  There is tortuosity of the thoracic aorta.  Overall heart size and vascularity are normal.  No acute osseous abnormalities.  No effusions.  IMPRESSION: Two possible nodules in the right upper lobe.   Original Report Authenticated By: Francene Boyers, M.D.    Ct Head Wo Contrast  06/23/2012  *RADIOLOGY REPORT*  Clinical Data: Patient found down.  CT HEAD WITHOUT CONTRAST  Technique:  Contiguous axial images were obtained from the base of the skull through the  vertex without contrast.  Comparison: Brain MRI and head CT 07/19/2011.  Findings: Extensive encephalomalacia in the right cerebellum is consistent with evolutionary change of the infarct seen on the comparison MRI.  Remote right PCA territory infarct is also identified. There is extensive chronic microvascular ischemic change.  Left inferior frontal lobe meningioma with surrounding edema is unchanged measuring 2.7 cm in diameter.   No evidence of acute abnormality including new infarct, hemorrhage, midline shift, hydrocephalus or pneumocephalus.  IMPRESSION:  1.  No acute finding.  2.  Multiple remote infarcts and extensive chronic microvascular ischemic disease.  3.  Left frontal meningioma, unchanged.   Original Report Authenticated By: Holley Dexter, M.D.     Scheduled Meds: . aspirin  325 mg Oral Daily  . cefTRIAXone (ROCEPHIN)  IV  1 g Intravenous Once  . [START ON 06/25/2012] cefTRIAXone (ROCEPHIN)  IV  1 g Intravenous Q24H  . enoxaparin (LOVENOX) injection  40 mg Subcutaneous Q24H   Continuous Infusions: . sodium chloride 100 mL/hr (06/24/12 1218)    Principal Problem:   UTI (urinary tract infection) Active Problems:   HTN (hypertension)   Hearing loss   Fall at home    Time spent: 30 min    Stori Royse, [facility]  [facility] Pager [phone]. If 7PM-7AM, please contact night-coverage at [email or web address], password [facility] 06/24/2012, 1:39 PM  LOS: 1 day

## 2012-06-24 NOTE — Care Management Note (Signed)
a    CARE MANAGEMENT NOTE 06/24/2012  Patient:  [name], [name]   Account Number:  [id number]  Date Initiated:  06/24/2012  Documentation initiated by:  Darlyne Russian  Subjective/Objective Assessment:   Patient admitted from home after a fall, increased weakness.  Lives with daughter and grandson.  PMH: CVA     Action/Plan:   Progression of care and discharge planning  4/14/Met with pt and called pt daughter with pt permission to discuss d/c needs.   Anticipated DC Date:  06/26/2012   Anticipated DC Plan:  HOME W HOME HEALTH SERVICES         Choice offered to / List presented to:          [facility] arranged  HH-1 RN      Status of service:  In process, will continue to follow Medicare Important Message given?   (If response is "NO", the following Medicare IM given date fields will be blank) Date Medicare IM given:   Date Additional Medicare IM given:    Discharge Disposition:  HOME W HOME HEALTH SERVICES  Per UR Regulation:    If discussed at Long Length of Stay Meetings, dates discussed:    Comments:  06/24/2012 Spoke with pt daughter,  Lowella Petties re [facility] needs, with pt permission. [name] is in the process of applying for Medicaid. She will apply for Personal Care Services after pt has Medicaid as well as apply for CAP services Discussed current options and list of HH agencies providing [facility] aides and sitters left for the daughter in the pt's bedside table. Daughter will also search locally in neighborhood and church community for sitters for her father in the home. Johny Shock RN MPH Case Manager [phone]

## 2012-06-25 LAB — URINE CULTURE
Colony Count: NO GROWTH
Culture: NO GROWTH

## 2012-06-25 LAB — CBC
HCT: 33.3 % — ABNORMAL LOW (ref 39.0–52.0)
Hemoglobin: 11.6 g/dL — ABNORMAL LOW (ref 13.0–17.0)
MCH: 31.3 pg (ref 26.0–34.0)
MCHC: 34.8 g/dL (ref 30.0–36.0)
MCV: 89.8 fL (ref 78.0–100.0)
Platelets: 109 10*3/uL — ABNORMAL LOW (ref 150–400)
RBC: 3.71 MIL/uL — ABNORMAL LOW (ref 4.22–5.81)
RDW: 13.9 % (ref 11.5–15.5)
WBC: 5.5 10*3/uL (ref 4.0–10.5)

## 2012-06-25 LAB — BASIC METABOLIC PANEL
BUN: 21 mg/dL (ref 6–23)
CO2: 25 mEq/L (ref 19–32)
Calcium: 10.1 mg/dL (ref 8.4–10.5)
Chloride: 110 mEq/L (ref 96–112)
Creatinine, Ser: 1.16 mg/dL (ref 0.50–1.35)
GFR calc Af Amer: 64 mL/min — ABNORMAL LOW (ref >90)
GFR calc non Af Amer: 56 mL/min — ABNORMAL LOW (ref >90)
Glucose, Bld: 112 mg/dL — ABNORMAL HIGH (ref 70–99)
Potassium: 3.9 mEq/L (ref 3.5–5.1)
Sodium: 141 mEq/L (ref 135–145)

## 2012-06-25 LAB — TROPONIN I: Troponin I: <0.3 ng/mL (ref <0.30)

## 2012-06-25 MED ORDER — AMLODIPINE BESYLATE 10 MG PO TABS
10.0000 mg | ORAL_TABLET | Freq: Every day | ORAL | Status: DC
  Administered 2012-06-25: 10 mg via ORAL
  Filled 2012-06-25: qty 1

## 2012-06-25 MED ORDER — BENAZEPRIL HCL 20 MG PO TABS
20.0000 mg | ORAL_TABLET | Freq: Every day | ORAL | Status: DC
  Administered 2012-06-25 – 2012-06-26 (×2): 20 mg via ORAL
  Filled 2012-06-25 (×2): qty 1

## 2012-06-25 MED ORDER — METOPROLOL TARTRATE 12.5 MG HALF TABLET
12.5000 mg | ORAL_TABLET | Freq: Two times a day (BID) | ORAL | Status: DC
  Administered 2012-06-26: 12.5 mg via ORAL
  Filled 2012-06-25 (×2): qty 1

## 2012-06-25 MED ORDER — CLOPIDOGREL BISULFATE 75 MG PO TABS
75.0000 mg | ORAL_TABLET | Freq: Every day | ORAL | Status: DC
  Administered 2012-06-26: 75 mg via ORAL
  Filled 2012-06-25 (×2): qty 1

## 2012-06-25 MED ORDER — ATORVASTATIN CALCIUM 20 MG PO TABS
20.0000 mg | ORAL_TABLET | Freq: Every day | ORAL | Status: DC
  Administered 2012-06-25 – 2012-06-26 (×2): 20 mg via ORAL
  Filled 2012-06-25 (×2): qty 1

## 2012-06-25 NOTE — Progress Notes (Signed)
[facility] PROGRESS NOTE  [name] ZOX:[id number] DOB: 1924-11-03 DOA: 06/23/2012 PCP: Quitman Livings, MD  Assessment/Plan  [name] was hospitalized with right-sided weakness, slurred speech and was found to have dehydration and a possible urinary tract infection with fever. He was hydrated and started on empiric antibiotics for UTI. He quickly improved to his baseline and his fever resolved. Ultimately, his urine culture was negative so antibiotics were discontinued. He may have had a TIA, although this is less likely given the fever and mildly elevated WBC count which suggested infection. His medications were adjusted to reduce his risk of having a future TIA by changing aspirin to plavix and starting a statin. He also had ectopy (MAT, PAC, PVC) on telemetry. We so far have not captured any atrial fibrillation, but he may be having some intermittent a-fib that is increasing his risk of stroke. He should follow up with cardiology for further evaluation.  He was seen by PT/OT who recommended HHPT and OT.   HTN, blood pressures were initially low, however, they increased with hydration. He was started back on an ACEI, but he was started on beta blocker instead of CCB due to ectopy on telemetry.   Ectopy on telemetry with frequent PVCs, PACs and possibly some multifocal atrial tachycardia.   -  Continue telemetry - Troponin was neg  - TSH is pending - He was started on beta blocker and should follow up with cardiology for possible further outpatient monitoring.   Possible UTI: Was treated with ceftriaxone which was discontinued when culture was negative.   CVA, stable with residual right sided weakness.  - On ASA, but changed to plavix  - Started statin   Hypercalcemia, likely due to dehydration and resolved with hydration.  Anemia, likely dilutional, repeat in AM. Mild thrombocytopenia, chronic. Defer to PCP  Diet:  Healthy heart Access:  PIV IVF:  OFF Proph:   lovenox  Code Status: full  Family Communication: spoke with patient and daughter Disposition Plan:   Home in AM.     Consultants:  none  Procedures:  Ct head  Antibiotics:  ceftriaxone   HPI/Subjective:  States he feels well. No complaints except that he needs the battery to his hearing aid. Communicated with gestures, shouting, and writing today. Denies headache, chest pain and shortness of breath.  Denies nausea, vomiting, diarrhea, constipation.    Objective: Filed Vitals:   06/25/12 0942 06/25/12 1410 06/25/12 1801 06/25/12 2031  BP: 134/74 108/53 123/69 123/70  Pulse: 85 91 82 73  Temp: 99 F (37.2 C) 98.9 F (37.2 C) 99 F (37.2 C) 98.9 F (37.2 C)  TempSrc: Oral Oral  Oral  Resp: 20 18 18 18   Height:      Weight:    82.283 kg (181 lb 6.4 oz)  SpO2: 95% 94% 94% 95%    Intake/Output Summary (Last 24 hours) at 06/25/12 2111 Last data filed at 06/25/12 1803  Gross per 24 hour  Intake   1550 ml  Output    825 ml  Net    725 ml   Filed Weights   06/24/12 0120 06/24/12 2021 06/25/12 2031  Weight: 82.237 kg (181 lb 4.8 oz) 82.237 kg (181 lb 4.8 oz) 82.283 kg (181 lb 6.4 oz)    Exam:   General:  AAM, No acute distress, sitting in chair.    HEENT:  NCAT, MMM  Cardiovascular:  RRR, nl S1, S2 no mrg, 2+ pulses, warm extremities  Respiratory:  CTAB, no increased WOB  Abdomen:   NABS, soft, NT/ND  MSK:   Normal tone and bulk, trace LEE  Neuro:  Grossly intact except for hearing impairment  Data Reviewed: Basic Metabolic Panel:  Recent Labs Lab 06/23/12 1741 06/25/12 0605  NA 141 141  K 4.0 3.9  CL 104 110  CO2 28 25  GLUCOSE 135* 112*  BUN 18 21  CREATININE 1.12 1.16  CALCIUM 11.1* 10.1   Liver Function Tests:  Recent Labs Lab 06/23/12 1741  AST 107*  ALT 64*  ALKPHOS 72  BILITOT 0.5  PROT 7.6  ALBUMIN 3.6   No results found for this basename: LIPASE, AMYLASE,  in the last 168 hours No results found for this basename: AMMONIA,   in the last 168 hours CBC:  Recent Labs Lab 06/23/12 1741 06/25/12 0605  WBC 10.0 5.5  NEUTROABS 9.4*  --   HGB 13.9 11.6*  HCT 39.3 33.3*  MCV 88.7 89.8  PLT 147* 109*   Cardiac Enzymes:  Recent Labs Lab 06/25/12 1338  TROPONINI <0.30   BNP (last 3 results)  Recent Labs  06/23/12 1741  PROBNP 1102.0*   CBG: No results found for this basename: GLUCAP,  in the last 168 hours  Recent Results (from the past 240 hour(s))  URINE CULTURE     Status: None   Collection Time    06/23/12  6:02 PM      Result Value Range Status   Specimen Description URINE, CLEAN CATCH   Final   Special Requests ADDED 1917   Final   Culture  Setup Time 06/24/2012 00:14   Final   Colony Count NO GROWTH   Final   Culture NO GROWTH   Final   Report Status 06/25/2012 FINAL   Final  CULTURE, BLOOD (ROUTINE X 2)     Status: None   Collection Time    06/23/12  8:50 PM      Result Value Range Status   Specimen Description BLOOD RIGHT HAND   Final   Special Requests BOTTLES DRAWN AEROBIC ONLY 2CC   Final   Culture  Setup Time 06/24/2012 00:07   Final   Culture     Final   Value:        BLOOD CULTURE RECEIVED NO GROWTH TO DATE CULTURE WILL BE HELD FOR 5 DAYS BEFORE ISSUING A FINAL NEGATIVE REPORT   Report Status PENDING   Incomplete  CULTURE, BLOOD (ROUTINE X 2)     Status: None   Collection Time    06/23/12  9:05 PM      Result Value Range Status   Specimen Description BLOOD RIGHT FINGER   Final   Special Requests BOTTLES DRAWN AEROBIC ONLY 1.5CC   Final   Culture  Setup Time 06/24/2012 00:07   Final   Culture     Final   Value:        BLOOD CULTURE RECEIVED NO GROWTH TO DATE CULTURE WILL BE HELD FOR 5 DAYS BEFORE ISSUING A FINAL NEGATIVE REPORT   Report Status PENDING   Incomplete     Studies: Dg Chest 2 View  06/23/2012  *RADIOLOGY REPORT*  Clinical Data: Shortness of breath.  The patient fell.  CHEST - 2 VIEW  Comparison: Chest x-ray dated 07/19/2011  Findings: There is a 17 mm  nodular appearing density at the medial aspect of the right lung apex.  There is also a second area of vague in density laterally in the right upper lobe measuring 10 mm in  diameter.  Lungs are otherwise clear.  There is tortuosity of the thoracic aorta.  Overall heart size and vascularity are normal.  No acute osseous abnormalities.  No effusions.  IMPRESSION: Two possible nodules in the right upper lobe.   Original Report Authenticated By: Francene Boyers, M.D.     Scheduled Meds: . aspirin  325 mg Oral Daily  . benazepril  20 mg Oral Daily  . enoxaparin (LOVENOX) injection  40 mg Subcutaneous Q24H  . [START ON 06/26/2012] metoprolol tartrate  12.5 mg Oral BID   Continuous Infusions:    Principal Problem:   UTI (urinary tract infection) Active Problems:   HTN (hypertension)   Hearing loss   Fall at home    Time spent: 30 min    Rockell Faulks, [facility]  [facility] Pager [phone]. If 7PM-7AM, please contact night-coverage at [email or web address], password [facility] 06/25/2012, 9:11 PM  LOS: 2 days

## 2012-06-25 NOTE — Care Management Note (Signed)
   CARE MANAGEMENT NOTE 06/25/2012  Patient:  [name], [name]   Account Number:  [id number]  Date Initiated:  06/24/2012  Documentation initiated by:  Darlyne Russian  Subjective/Objective Assessment:   Patient admitted from home after a fall, increased weakness.  Lives with daughter and grandson.  PMH: CVA     Action/Plan:   Progression of care and discharge planning  4/14/Met with pt and called pt daughter with pt permission to discuss d/c needs.   Anticipated DC Date:  06/26/2012   Anticipated DC Plan:  HOME W HOME HEALTH SERVICES         [facility] Choice  HOME HEALTH   Choice offered to / List presented to:  C-4 Adult Children        HH arranged  HH-2 PT  HH-3 OT      [facility] agency  [facility]   Status of service:  Completed, signed off Medicare Important Message given?   (If response is "NO", the following Medicare IM given date fields will be blank) Date Medicare IM given:   Date Additional Medicare IM given:    Discharge Disposition:  HOME W HOME HEALTH SERVICES  Per UR Regulation:    If discussed at Long Length of Stay Meetings, dates discussed:    Comments:  06/25/2012 Spoke with pt daughter, Ms Heriberto Antigua who states that she has used [facility] previously and wishes to use that agency for HHPT and HHOT. [facility] notified. Elnita Maxwell Adamarie Izzo RN MPH   06/24/2012 Spoke with pt daughter,  Lowella Petties re [facility] needs, with pt permission. [name] is in the process of applying for Medicaid. She will apply for Personal Care Services after pt has Medicaid as well as apply for CAP services Discussed current options and list of HH agencies providing [facility] aides and sitters left for the daughter in the pt's bedside table. Daughter will also search locally in neighborhood and church community for sitters for her father in the home. Johny Shock RN MPH Case Manager [phone]

## 2012-06-25 NOTE — Progress Notes (Signed)
Late entry:  Patient on telemetry with multiple episodes of pair PVC's, PAC's and PJC's.  Patient's rhythm is irregular and is questionable for A-fibb.  Made MD aware. EKG was ordered and completed.  EKG placed in the patient's shadow chart.  Patient without complaints and vital signs are stable.  Changed telemetry leads and checked placement of leads with no change in irregular rhythm.  Will continue to monitor.

## 2012-06-25 NOTE — Evaluation (Signed)
Physical Therapy Evaluation Patient Details Name: [name] MRN: [id number] DOB: 1924-03-31 Today's Date: 06/25/2012 Time: 1478-2956 PT Time Calculation (min): 23 min  PT Assessment / Plan / Recommendation Clinical Impression  Pt is an 79 y/o male admitted with UTI.  Pt has history of Altered mental status, and is clinically Deaf  Pt would benefit from 24/7 supervison at home.  If 24/7 or at least waking hour supervision is not available pt may need to consider SNF for safety.  At this time pt appears to be functioning at or near his baseline level.  If d/c to home suggesting HHPT to reinforce safety with mobility. especially with RW.   Acute PT will continue to folllow pt.     PT Assessment  Patient needs continued PT services    Follow Up Recommendations  Home health PT;Supervision/Assistance - 24 hour    Does the patient have the potential to tolerate intense rehabilitation      Barriers to Discharge None      Equipment Recommendations  None recommended by PT    Recommendations for Other Services     Frequency Min 3X/week    Precautions / Restrictions Precautions Precautions: Fall Restrictions Weight Bearing Restrictions: No   Pertinent Vitals/Pain No c/o pain.        Mobility  Bed Mobility Bed Mobility: Supine to Sit;Sit to Supine Supine to Sit: 6: Modified independent (Device/Increase time);With rails Sit to Supine: 6: Modified independent (Device/Increase time);With rail Details for Bed Mobility Assistance: Pt up in recliner upon arrival. Transfers Transfers: Sit to Stand;Stand to Sit Sit to Stand: 4: Min assist;With upper extremity assist;With armrests;From chair/3-in-1 Stand to Sit: 4: Min assist;With upper extremity assist;With armrests;To chair/3-in-1 Details for Transfer Assistance: Assist to steady pt and lift hips from bed/chair to stand; Tactile cueing for safe technique to sit.  ( pt sitting prematurely) Ambulation/Gait Ambulation/Gait Assistance:  4: Min assist Ambulation Distance (Feet): 80 Feet Assistive device: Rolling walker Ambulation/Gait Assistance Details: Tactile cues to decrease distance from walker espcially with change in direction.  Gait Pattern: Step-through pattern;Decreased stride length Gait velocity: slow <2 ft/sec General Gait Details: VCs to increase gait speed.  Stairs: No Wheelchair Mobility Wheelchair Mobility: No    Exercises     PT Diagnosis: Altered mental status;Abnormality of gait  PT Problem List: Decreased knowledge of use of DME;Decreased mobility PT Treatment Interventions: Gait training;Therapeutic activities;Neuromuscular re-education;Functional mobility training;DME instruction;Patient/family education   PT Goals Acute Rehab PT Goals PT Goal Formulation: With patient Time For Goal Achievement: 07/02/12 Potential to Achieve Goals: Good Pt will go Sit to Stand: with supervision PT Goal: Sit to Stand - Progress: Goal set today Pt will go Stand to Sit: with supervision PT Goal: Stand to Sit - Progress: Goal set today Pt will Ambulate: >150 feet;with supervision;with rolling walker PT Goal: Ambulate - Progress: Goal set today  Visit Information  Last PT Received On: 06/25/12 Assistance Needed: +1    Subjective Data  Subjective: Agree to PT EVal.   Patient Stated Goal: return to home.    Prior Functioning  Home Living Lives With: Daughter Available Help at Discharge: Family;Available PRN/intermittently Type of Home: House Home Access: Ramped entrance Home Layout: One level Bathroom Shower/Tub: Tub/shower unit;Curtain Firefighter: Standard Bathroom Accessibility: Yes How Accessible: Accessible via walker Home Adaptive Equipment: Walker - rolling;Wheelchair - manual;Shower chair with back;Hand-held shower hose;Grab bars in shower;Bedside commode/3-in-1 Prior Function Level of Independence: Independent with assistive device(s) (RW) Able to Take Stairs?: No Driving: No  Vocation:  Retired Musician: Deaf;Expressive difficulties Dominant Hand: Right    Cognition  Cognition Overall Cognitive Status: History of cognitive impairments - at baseline Arousal/Alertness: Awake/alert Orientation Level: Other (comment);Appears intact for tasks assessed (Difficult to assess secondary to hearing impairment. ) Behavior During Session: [facility] for tasks performed Cognition - Other Comments: Memory is an issue per daughter    Extremity/Trunk Assessment Right Upper Extremity Assessment RUE ROM/Strength/Tone: Within functional levels Left Upper Extremity Assessment LUE ROM/Strength/Tone: Within functional levels Right Lower Extremity Assessment RLE ROM/Strength/Tone: [facility] for tasks assessed Left Lower Extremity Assessment LLE ROM/Strength/Tone: [facility] for tasks assessed   Balance Balance Balance Assessed: Yes Static Sitting Balance Static Sitting - Balance Support: Feet supported Static Sitting - Level of Assistance: 5: Stand by assistance Static Sitting - Comment/# of Minutes: No LOB noted Static Standing Balance Static Standing - Level of Assistance: 5: Stand by assistance Static Standing - Comment/# of Minutes: 1-2 minutes standing with RW no LOB noted.    End of Session PT - End of Session Equipment Utilized During Treatment: Gait belt Activity Tolerance: Patient tolerated treatment well Patient left: in chair;with call bell/phone within reach Nurse Communication: Mobility status  GP Functional Assessment Tool Used: Clinical judement.   Functional Limitation: Mobility: Walking and moving around Mobility: Walking and Moving Around Current Status [phone]): At least 1 percent but less than 20 percent impaired, limited or restricted Mobility: Walking and Moving Around Goal Status [phone]): 0 percent impaired, limited or restricted   [name] 06/25/2012, 1:09 PM [name] DPT [phone]

## 2012-06-25 NOTE — Evaluation (Signed)
Occupational Therapy Evaluation Patient Details Name: [name] MRN: [id number] DOB: 1924/06/22 Today's Date: 06/25/2012 Time: 4098-1191 OT Time Calculation (min): 20 min  OT Assessment / Plan / Recommendation Clinical Impression  This 79 yo male admitted dehydration, weakness, and UTI presents to acute OT with problems below. Will benefit from acute OT with follow up HHOT.    OT Assessment  Patient needs continued OT Services    Follow Up Recommendations  Home health OT    Barriers to Discharge Decreased caregiver support Have talked to his daughter and made her aware that we are recommending 24 hour S/prn A initially and then she can decide how long he needs this based off how she feels he is doing at home.  Equipment Recommendations  None recommended by OT       Frequency  Min 2X/week    Precautions / Restrictions Precautions Precautions: Fall Restrictions Weight Bearing Restrictions: No    Pt is pretty much deaf, hearing aid is only for him to feel vibration that someone is actually trying to talk to him (speak very loudly and directly to him   ADL  Eating/Feeding: Simulated;Independent Where Assessed - Eating/Feeding: Chair Grooming: Simulated;Set up;Supervision/safety Where Assessed - Grooming: Supported sitting Upper Body Bathing: Simulated;Set up;Supervision/safety Where Assessed - Upper Body Bathing: Supported sitting Lower Body Bathing: Simulated;Minimal assistance Where Assessed - Lower Body Bathing: Supported sit to stand Upper Body Dressing: Simulated;Supervision/safety;Set up Where Assessed - Upper Body Dressing: Unsupported sitting Lower Body Dressing: Simulated;Minimal assistance Where Assessed - Lower Body Dressing: Supported sit to stand Toilet Transfer: Performed;Minimal assistance Toilet Transfer Method: Sit to Barista: Grab bars;Comfort height toilet Toileting - Clothing Manipulation and Hygiene: Simulated;Minimal  assistance Where Assessed - Engineer, mining and Hygiene: Standing Equipment Used: Gait belt;Rolling walker Transfers/Ambulation Related to ADLs: Min A for all with RW ADL Comments: Pt could doff left sock by crossing one leg over the other, but not donn left sock. Pt could doff and donn right sock by crossing right leg over left.    OT Diagnosis: Generalized weakness  OT Problem List: Decreased strength;Impaired balance (sitting and/or standing) OT Treatment Interventions: Self-care/ADL training;Patient/family education;Balance training   OT Goals Acute Rehab OT Goals OT Goal Formulation: With patient Time For Goal Achievement: 07/02/12 Potential to Achieve Goals: Good ADL Goals Pt Will Perform Grooming: Unsupported;Standing at sink (min guard A) ADL Goal: Grooming - Progress: Goal set today Pt Will Perform Lower Body Dressing: Unsupported;Sit to stand from chair;Sit to stand from bed (min guard A) ADL Goal: Lower Body Dressing - Progress: Goal set today Pt Will Transfer to Toilet: Ambulation;Comfort height toilet;Grab bars (min guard A) ADL Goal: Toilet Transfer - Progress: Goal set today Pt Will Perform Toileting - Clothing Manipulation: Standing (min guard A) ADL Goal: Toileting - Clothing Manipulation - Progress: Goal set today Pt Will Perform Toileting - Hygiene: Sit to stand from 3-in-1/toilet (min guard A)  Visit Information  Last OT Received On: 06/25/12 Assistance Needed: +1    Subjective Data  Subjective: "i know how to get the nurse"   Prior Functioning     Home Living Lives With: Daughter (and grandson) Available Help at Discharge: Family;Available PRN/intermittently (daughter works) Type of Home: House Home Access: Ramped entrance Home Layout: One level Bathroom Shower/Tub: Forensic scientist: Standard Bathroom Accessibility: Yes How Accessible: Accessible via walker Home Adaptive Equipment: Walker - rolling;Wheelchair -  manual;Shower chair with back;Hand-held shower hose;Grab bars in shower;Bedside commode/3-in-1 Prior Function Level of  Independence: Independent with assistive device(s) (RW) Able to Take Stairs?: No Driving: No Vocation: Retired Musician: Expressive difficulties;Deaf (per daughter hearing aid is only for vibration) Dominant Hand: Right         Vision/Perception Vision - History Baseline Vision: Wears glasses all the time Patient Visual Report: No change from baseline   Cognition  Cognition Overall Cognitive Status: History of cognitive impairments - at baseline Arousal/Alertness: Awake/alert Behavior During Session: [facility] for tasks performed Cognition - Other Comments: Memory is an issue per daughter    Extremity/Trunk Assessment Right Upper Extremity Assessment RUE ROM/Strength/Tone: Within functional levels (tremor noted as well as mild decreased coordination) Left Upper Extremity Assessment LUE ROM/Strength/Tone: Within functional levels     Mobility Bed Mobility Details for Bed Mobility Assistance: Pt up in recliner upon arrival. Transfers Transfers: Sit to Stand;Stand to Sit Sit to Stand: 4: Min assist;With upper extremity assist;With armrests;From chair/3-in-1 Stand to Sit: 4: Min assist;With upper extremity assist;With armrests;To chair/3-in-1           End of Session OT - End of Session Equipment Utilized During Treatment: Gait belt Activity Tolerance: Patient tolerated treatment well Patient left: in chair;with call bell/phone within reach Nurse Communication: Mobility status (Can walk with +1 to bathroom with RW)  GO Functional Assessment Tool Used: Clinical assessment Functional Limitation: Self care Self Care Current Status (Y7829): At least 1 percent but less than 20 percent impaired, limited or restricted Self Care Goal Status (F6213): At least 1 percent but less than 20 percent impaired, limited or restricted   [name] [phone] 06/25/2012, 10:41 AM

## 2012-06-26 LAB — BASIC METABOLIC PANEL
BUN: 18 mg/dL (ref 6–23)
CO2: 26 mEq/L (ref 19–32)
Calcium: 10.3 mg/dL (ref 8.4–10.5)
Chloride: 110 mEq/L (ref 96–112)
Creatinine, Ser: 1.06 mg/dL (ref 0.50–1.35)
GFR calc Af Amer: 72 mL/min — ABNORMAL LOW (ref >90)
GFR calc non Af Amer: 62 mL/min — ABNORMAL LOW (ref >90)
Glucose, Bld: 109 mg/dL — ABNORMAL HIGH (ref 70–99)
Potassium: 3.6 mEq/L (ref 3.5–5.1)
Sodium: 142 mEq/L (ref 135–145)

## 2012-06-26 LAB — CBC
HCT: 33.1 % — ABNORMAL LOW (ref 39.0–52.0)
Hemoglobin: 11.5 g/dL — ABNORMAL LOW (ref 13.0–17.0)
MCH: 30.5 pg (ref 26.0–34.0)
MCHC: 34.7 g/dL (ref 30.0–36.0)
MCV: 87.8 fL (ref 78.0–100.0)
Platelets: 108 10*3/uL — ABNORMAL LOW (ref 150–400)
RBC: 3.77 MIL/uL — ABNORMAL LOW (ref 4.22–5.81)
RDW: 13.8 % (ref 11.5–15.5)
WBC: 5.4 10*3/uL (ref 4.0–10.5)

## 2012-06-26 LAB — TSH: TSH: 1.894 u[IU]/mL (ref 0.350–4.500)

## 2012-06-26 MED ORDER — BENAZEPRIL HCL 20 MG PO TABS: 20.0000 mg | ORAL_TABLET | Freq: Every day | ORAL | Status: DC

## 2012-06-26 MED ORDER — METOPROLOL TARTRATE 12.5 MG HALF TABLET: 12.5000 mg | ORAL_TABLET | Freq: Two times a day (BID) | ORAL | Status: DC

## 2012-06-26 MED ORDER — ATORVASTATIN CALCIUM 20 MG PO TABS: 20.0000 mg | ORAL_TABLET | Freq: Every day | ORAL | Status: DC

## 2012-06-26 MED ORDER — CLOPIDOGREL BISULFATE 75 MG PO TABS: 75.0000 mg | ORAL_TABLET | Freq: Every day | ORAL | Status: DC

## 2012-06-26 NOTE — Discharge Summary (Signed)
Physician Discharge Summary  [name] QMV:[id number] DOB: May 26, 1924 DOA: 06/23/2012  PCP: Quitman Livings, MD  Admit date: 06/23/2012 Discharge date: 06/26/2012  Time spent: > 35 minutes  Recommendations for Outpatient Follow-up:  1. Please be sure to follow up with your pcp in 1-2 weeks or sooner should any new concerns arise. 2. Also given your multiple pvc's and irregular telemetry strips reportedly as MAT, PAC, and or PVC you will need to follow up with cardiology. On day of discharge on my review there were intermittent pvc's on telemetry  Discharge Diagnoses:  Principal Problem:   UTI (urinary tract infection) Active Problems:   HTN (hypertension)   Hearing loss   Fall at home   Discharge Condition: Stable  Diet recommendation: Heart healthy  Filed Weights   06/24/12 0120 06/24/12 2021 06/25/12 2031  Weight: 82.237 kg (181 lb 4.8 oz) 82.237 kg (181 lb 4.8 oz) 82.283 kg (181 lb 6.4 oz)    History of present illness:  79 y/o with prior h/o cva presenting with right sided weakness, dehydration, and suspected UTI.  While in house found to have abnormal telemetry monitoring.  Hospital Course:  HTN, blood pressures were initially low, however, they increased with hydration. He was started back on an ACEI, but he was started on beta blocker instead of CCB due to ectopy on telemetry.   Ectopy on telemetry with frequent PVCs, PACs and possibly some multifocal atrial tachycardia.  - Telemetry personally reviewed on day of d/c. PVC's were noted with artifact.   VSS - Troponin was neg  - TSH within normal limits. - He was started on beta blocker and should follow up with cardiology for possible further outpatient monitoring. Dr. Annitta Jersey office contacted and will reach out to patient.  Possible UTI: Was treated with ceftriaxone which was discontinued when culture was negative.   CVA, stable with residual right sided weakness.  - Was on ASA at home, but changed to plavix  -  Started statin while in house  Hypercalcemia, likely due to dehydration and resolved with hydration.   Anemia: likely dilutional, no active bleeding. Stable currently. Will need continued monitoring.  Mild thrombocytopenia, chronic. Defer to PCP for further work up and monitoring as outpatient.  Procedures:  none  Consultations:  none  Discharge Exam: Filed Vitals:   06/25/12 1410 06/25/12 1801 06/25/12 2031 06/26/12 0416  BP: 108/53 123/69 123/70 137/83  Pulse: 91 82 73 80  Temp: 98.9 F (37.2 C) 99 F (37.2 C) 98.9 F (37.2 C) 98.7 F (37.1 C)  TempSrc: Oral  Oral Oral  Resp: 18 18 18 16   Height:      Weight:   82.283 kg (181 lb 6.4 oz)   SpO2: 94% 94% 95% 94%    General: Pt smiling, in NAD, Alert and Awake Cardiovascular: RRR with ectopic beats, no murmurs Respiratory: CTA BL, no wheezes  Discharge Instructions  Discharge Orders   Future Orders Complete By Expires     Call MD for:  persistant dizziness or light-headedness  As directed     Call MD for:  persistant nausea and vomiting  As directed     Call MD for:  severe uncontrolled pain  As directed     Diet - low sodium heart healthy  As directed     Discharge instructions  As directed     Comments:      Discharge with home health for PT/OT services.  Will need to follow up with Cardiology and your primary  care physician once discharged within a 1-2 week period or sooner should any new concerns arise.    Increase activity slowly  As directed         Medication List    STOP taking these medications       amLODipine-benazepril 10-20 MG per capsule  Commonly known as:  LOTREL     aspirin 325 MG tablet     sulfamethoxazole-trimethoprim 400-80 MG per tablet  Commonly known as:  BACTRIM,SEPTRA      TAKE these medications       atorvastatin 20 MG tablet  Commonly known as:  LIPITOR  Take 1 tablet (20 mg total) by mouth daily at 6 PM.     benazepril 20 MG tablet  Commonly known as:  LOTENSIN   Take 1 tablet (20 mg total) by mouth daily.     clopidogrel 75 MG tablet  Commonly known as:  PLAVIX  Take 1 tablet (75 mg total) by mouth daily with breakfast.     metoprolol tartrate 12.5 mg Tabs  Commonly known as:  LOPRESSOR  Take 0.5 tablets (12.5 mg total) by mouth 2 (two) times daily.     sennosides-docusate sodium 8.6-50 MG tablet  Commonly known as:  SENOKOT-S  Take 1 tablet by mouth at bedtime as needed. For constipation          The results of significant diagnostics from this hospitalization (including imaging, microbiology, ancillary and laboratory) are listed below for reference.    Significant Diagnostic Studies: Dg Chest 2 View  06/23/2012  *RADIOLOGY REPORT*  Clinical Data: Shortness of breath.  The patient fell.  CHEST - 2 VIEW  Comparison: Chest x-ray dated 07/19/2011  Findings: There is a 17 mm nodular appearing density at the medial aspect of the right lung apex.  There is also a second area of vague in density laterally in the right upper lobe measuring 10 mm in diameter.  Lungs are otherwise clear.  There is tortuosity of the thoracic aorta.  Overall heart size and vascularity are normal.  No acute osseous abnormalities.  No effusions.  IMPRESSION: Two possible nodules in the right upper lobe.   Original Report Authenticated By: Francene Boyers, M.D.    Ct Head Wo Contrast  06/23/2012  *RADIOLOGY REPORT*  Clinical Data: Patient found down.  CT HEAD WITHOUT CONTRAST  Technique:  Contiguous axial images were obtained from the base of the skull through the vertex without contrast.  Comparison: Brain MRI and head CT 07/19/2011.  Findings: Extensive encephalomalacia in the right cerebellum is consistent with evolutionary change of the infarct seen on the comparison MRI.  Remote right PCA territory infarct is also identified. There is extensive chronic microvascular ischemic change.  Left inferior frontal lobe meningioma with surrounding edema is unchanged measuring 2.7 cm  in diameter.   No evidence of acute abnormality including new infarct, hemorrhage, midline shift, hydrocephalus or pneumocephalus.  IMPRESSION:  1.  No acute finding.  2.  Multiple remote infarcts and extensive chronic microvascular ischemic disease.  3.  Left frontal meningioma, unchanged.   Original Report Authenticated By: Holley Dexter, M.D.     Microbiology: Recent Results (from the past 240 hour(s))  URINE CULTURE     Status: None   Collection Time    06/23/12  6:02 PM      Result Value Range Status   Specimen Description URINE, CLEAN CATCH   Final   Special Requests ADDED [phone]   Final   Culture  Setup Time  06/24/2012 00:14   Final   Colony Count NO GROWTH   Final   Culture NO GROWTH   Final   Report Status 06/25/2012 FINAL   Final  CULTURE, BLOOD (ROUTINE X 2)     Status: None   Collection Time    06/23/12  8:50 PM      Result Value Range Status   Specimen Description BLOOD RIGHT HAND   Final   Special Requests BOTTLES DRAWN AEROBIC ONLY 2CC   Final   Culture  Setup Time 06/24/2012 00:07   Final   Culture     Final   Value:        BLOOD CULTURE RECEIVED NO GROWTH TO DATE CULTURE WILL BE HELD FOR 5 DAYS BEFORE ISSUING A FINAL NEGATIVE REPORT   Report Status PENDING   Incomplete  CULTURE, BLOOD (ROUTINE X 2)     Status: None   Collection Time    06/23/12  9:05 PM      Result Value Range Status   Specimen Description BLOOD RIGHT FINGER   Final   Special Requests BOTTLES DRAWN AEROBIC ONLY 1.5CC   Final   Culture  Setup Time 06/24/2012 00:07   Final   Culture     Final   Value:        BLOOD CULTURE RECEIVED NO GROWTH TO DATE CULTURE WILL BE HELD FOR 5 DAYS BEFORE ISSUING A FINAL NEGATIVE REPORT   Report Status PENDING   Incomplete     Labs: Basic Metabolic Panel:  Recent Labs Lab 06/23/12 1741 06/25/12 0605 06/26/12 0625  NA 141 141 142  K 4.0 3.9 3.6  CL 104 110 110  CO2 28 25 26   GLUCOSE 135* 112* 109*  BUN 18 21 18   CREATININE 1.12 1.16 1.06  CALCIUM 11.1*  10.1 10.3   Liver Function Tests:  Recent Labs Lab 06/23/12 1741  AST 107*  ALT 64*  ALKPHOS 72  BILITOT 0.5  PROT 7.6  ALBUMIN 3.6   No results found for this basename: LIPASE, AMYLASE,  in the last 168 hours No results found for this basename: AMMONIA,  in the last 168 hours CBC:  Recent Labs Lab 06/23/12 1741 06/25/12 0605 06/26/12 0625  WBC 10.0 5.5 5.4  NEUTROABS 9.4*  --   --   HGB 13.9 11.6* 11.5*  HCT 39.3 33.3* 33.1*  MCV 88.7 89.8 87.8  PLT 147* 109* 108*   Cardiac Enzymes:  Recent Labs Lab 06/25/12 1338  TROPONINI <0.30   BNP: BNP (last 3 results)  Recent Labs  06/23/12 1741  PROBNP 1102.0*   CBG: No results found for this basename: GLUCAP,  in the last 168 hours     Signed:  Penny Pia  [facility] 06/26/2012, 8:57 AM

## 2012-06-26 NOTE — Progress Notes (Signed)
Occupational Therapy Treatment Patient Details Name: [name] MRN: [id number] DOB: 02-05-1925 Today's Date: 06/26/2012 Time: 4098-1191 OT Time Calculation (min): 28 min  OT Assessment / Plan / Recommendation Comments on Treatment Session Pt is progressing.  Pt still needs supervision - min A in adls and transfers for safety to prevent falls. Increased time with pt due to communication barrier of decreased hearing. Pt d/c plan remains appropriate.    Follow Up Recommendations  Home health OT       Equipment Recommendations  None recommended by OT       Frequency Min 2X/week   Plan Discharge plan remains appropriate    Precautions / Restrictions Precautions Precautions: Fall Precaution Comments: Severe HOH Restrictions Weight Bearing Restrictions: No       ADL  Grooming: Performed;Wash/dry face;Supervision/safety;Set up Where Assessed - Grooming: Unsupported standing Toilet Transfer: Buyer, retail Method: Sit to Barista:  (bed>sink>recliner) Equipment Used: Gait belt;Rolling walker Transfers/Ambulation Related to ADLs: Min A sit to stand from bed due to posterior lean. Min guard A for ambulation and safety with RW use and hand placement ADL Comments: Pt was able to wash face and clean up water spilage from sink with minguard A      OT Goals ADL Goals ADL Goal: Grooming - Progress: Met ADL Goal: Toilet Transfer - Progress: Progressing toward goals  Visit Information  Last OT Received On: 06/26/12 Assistance Needed: +1    Subjective Data  Subjective: My daughter is coming at 6:30       Cognition  Cognition Arousal/Alertness: Awake/alert Behavior During Therapy: WFL for tasks assessed/performed Overall Cognitive Status: Within Functional Limits for tasks assessed General Comments: Memory is an issue per daughter--per prior PT note    Mobility  Bed Mobility Bed Mobility: Supine to Sit;Sitting - Scoot  to Edge of Bed Supine to Sit: 4: Min guard Sitting - Scoot to [place] of Bed: 5: Supervision Transfers Transfers: Sit to Stand;Stand to Sit Sit to Stand: 4: Min assist;With upper extremity assist;From bed Stand to Sit: 4: Min guard;With upper extremity assist;With armrests;To chair/3-in-1 Details for Transfer Assistance: VCs and gestures for hand placement with standing and sitting       Balance Balance Balance Assessed: Yes Dynamic Standing Balance Dynamic Standing - Balance Support: No upper extremity supported;During functional activity Dynamic Standing - Level of Assistance:  (supervision/safety) Dynamic Standing - Balance Activities: Reaching for objects Dynamic Standing - Comments:  (washing face and cleaning up spillage on sink counter)   End of Session OT - End of Session Equipment Utilized During Treatment: Gait belt Activity Tolerance: Patient tolerated treatment well Patient left: in chair;with call bell/phone within reach;with chair alarm set Nurse Communication: Precautions;Other (comment) (In chair with alarm on)       Dietrich Pates 06/26/2012, 3:48 PM

## 2012-06-26 NOTE — Progress Notes (Signed)
I have reviewed and agree with this note. [name], [place] [phone] 06/26/2012

## 2012-06-26 NOTE — Progress Notes (Signed)
Pt. d/c'd home with daughter. All belongings sent with patient. IV d/c'd. No skin issues upon d/c.

## 2012-06-30 LAB — CULTURE, BLOOD (ROUTINE X 2)
Culture: NO GROWTH
Culture: NO GROWTH

## 2012-08-30 ENCOUNTER — Encounter: Payer: Self-pay | Admitting: Licensed Clinical Social Worker

## 2012-08-30 ENCOUNTER — Encounter: Payer: Self-pay | Admitting: Internal Medicine

## 2012-08-30 ENCOUNTER — Ambulatory Visit (INDEPENDENT_AMBULATORY_CARE_PROVIDER_SITE_OTHER): Payer: Medicare Other | Admitting: Internal Medicine

## 2012-08-30 VITALS — BP 163/90 | HR 67 | Temp 98.6°F | Ht 73.0 in | Wt 177.6 lb

## 2012-08-30 DIAGNOSIS — R7401 Elevation of levels of liver transaminase levels: Secondary | ICD-10-CM

## 2012-08-30 DIAGNOSIS — I10 Essential (primary) hypertension: Secondary | ICD-10-CM

## 2012-08-30 DIAGNOSIS — H6123 Impacted cerumen, bilateral: Secondary | ICD-10-CM

## 2012-08-30 DIAGNOSIS — I6350 Cerebral infarction due to unspecified occlusion or stenosis of unspecified cerebral artery: Secondary | ICD-10-CM

## 2012-08-30 DIAGNOSIS — I639 Cerebral infarction, unspecified: Secondary | ICD-10-CM

## 2012-08-30 DIAGNOSIS — D649 Anemia, unspecified: Secondary | ICD-10-CM

## 2012-08-30 DIAGNOSIS — R269 Unspecified abnormalities of gait and mobility: Secondary | ICD-10-CM

## 2012-08-30 DIAGNOSIS — R296 Repeated falls: Secondary | ICD-10-CM

## 2012-08-30 DIAGNOSIS — Z9181 History of falling: Secondary | ICD-10-CM

## 2012-08-30 DIAGNOSIS — H6120 Impacted cerumen, unspecified ear: Secondary | ICD-10-CM | POA: Insufficient documentation

## 2012-08-30 DIAGNOSIS — Z0000 Encounter for general adult medical examination without abnormal findings: Secondary | ICD-10-CM

## 2012-08-30 LAB — COMPLETE METABOLIC PANEL WITH GFR
ALT: 13 U/L (ref 0–53)
AST: 18 U/L (ref 0–37)
Albumin: 3.7 g/dL (ref 3.5–5.2)
Alkaline Phosphatase: 68 U/L (ref 39–117)
BUN: 17 mg/dL (ref 6–23)
CO2: 29 mEq/L (ref 19–32)
Calcium: 10.6 mg/dL — ABNORMAL HIGH (ref 8.4–10.5)
Chloride: 104 mEq/L (ref 96–112)
Creat: 1.06 mg/dL (ref 0.50–1.35)
GFR, Est African American: 74 mL/min
GFR, Est Non African American: 64 mL/min
Glucose, Bld: 93 mg/dL (ref 70–99)
Potassium: 4.4 mEq/L (ref 3.5–5.3)
Sodium: 139 mEq/L (ref 135–145)
Total Bilirubin: 0.5 mg/dL (ref 0.3–1.2)
Total Protein: 6.8 g/dL (ref 6.0–8.3)

## 2012-08-30 LAB — CBC WITH DIFFERENTIAL/PLATELET
Basophils Absolute: 0 10*3/uL (ref 0.0–0.1)
Basophils Relative: 0 % (ref 0–1)
Eosinophils Absolute: 0.2 10*3/uL (ref 0.0–0.7)
Eosinophils Relative: 2 % (ref 0–5)
HCT: 37.1 % — ABNORMAL LOW (ref 39.0–52.0)
Hemoglobin: 12.5 g/dL — ABNORMAL LOW (ref 13.0–17.0)
Lymphocytes Relative: 27 % (ref 12–46)
Lymphs Abs: 1.7 10*3/uL (ref 0.7–4.0)
MCH: 29.8 pg (ref 26.0–34.0)
MCHC: 33.7 g/dL (ref 30.0–36.0)
MCV: 88.3 fL (ref 78.0–100.0)
Monocytes Absolute: 0.4 10*3/uL (ref 0.1–1.0)
Monocytes Relative: 6 % (ref 3–12)
Neutro Abs: 4 10*3/uL (ref 1.7–7.7)
Neutrophils Relative %: 65 % (ref 43–77)
Platelets: 212 10*3/uL (ref 150–400)
RBC: 4.2 MIL/uL — ABNORMAL LOW (ref 4.22–5.81)
RDW: 13.8 % (ref 11.5–15.5)
WBC: 6.3 10*3/uL (ref 4.0–10.5)

## 2012-08-30 MED ORDER — CLOPIDOGREL BISULFATE 75 MG PO TABS: 75.0000 mg | ORAL_TABLET | Freq: Every day | ORAL | Status: DC

## 2012-08-30 MED ORDER — BENAZEPRIL HCL 20 MG PO TABS: 20.0000 mg | ORAL_TABLET | Freq: Every day | ORAL | Status: DC

## 2012-08-30 MED ORDER — ATORVASTATIN CALCIUM 20 MG PO TABS: 20.0000 mg | ORAL_TABLET | Freq: Every day | ORAL | Status: DC

## 2012-08-30 MED ORDER — METOPROLOL TARTRATE 25 MG PO TABS: 12.5000 mg | ORAL_TABLET | Freq: Two times a day (BID) | ORAL | Status: DC

## 2012-08-30 NOTE — Progress Notes (Signed)
CSW met with Mr. Malcomb and his daughter, Lowella Petties.  Pt is currently living with daughter and she is primary caregiver.  Dau inquiring about personal care services.  Daughter works outside of the home and would like assistance to help Mr. Evilsizer with ADL's while she is out of the home.  Ms. Heriberto Antigua also states pt's gait is unsteady due to effects for a CVA and pt is a fall risk.  Mr. Boehlke uses a w/w, but daughter states does not use the w/w appropriately.  CSW discussed home health PT, daughter in agreement and has utilized [facility] in the past.  Order placed and referral to [facility] completed.  CSW discussed community resources available through [employer] [facility] [place]: provided daughter with listing of medical alert systems to assist Mr. Ortega and detect falls, provided private duty listing, daughter aware at this time insurance does not cover personal care.  Ms. Heriberto Antigua currently has the Advance Directives packet from [facility], daughter would like a dual POA to cover financial as well.  CSW referred daughter to [employer] for referral for attorney or legal aid.  Provided Ms. Rembert with information on Adult Day Care in [facility] as this is an option as well.  CSW provided dau with fall prevention listing and CSW contact information.  CSW will sign off at this time.  Daughter denies add'l needs but aware CSW is available to assist as needed.

## 2012-08-30 NOTE — Assessment & Plan Note (Addendum)
Pt and daughter are not sure about tetanus immunization status, prior colonoscopy status. She thinks he did have a PNA shot.  At his age would likely not benefit from initiating screening colonoscopy. - Records requested from PCP. May need zostavax, tetanus, pneumovax immuniz pending records

## 2012-08-30 NOTE — Assessment & Plan Note (Signed)
Irrigation today 

## 2012-08-30 NOTE — Assessment & Plan Note (Signed)
Stable w residual ataxia.

## 2012-08-30 NOTE — Progress Notes (Signed)
Case discussed with Dr. Ziemer at the time of the visit.  We reviewed the resident's history and exam and pertinent patient test results.  I agree with the assessment, diagnosis, and plan of care documented in the resident's note.  

## 2012-08-30 NOTE — Assessment & Plan Note (Addendum)
Lab Results  Component Value Date   WBC 5.4 06/26/2012   HGB 11.5* 06/26/2012   HCT 33.1* 06/26/2012   MCV 87.8 06/26/2012   PLT 108* 06/26/2012   Noted during hospital admission. Unclear baseline. No active bleeding, not microcytic. Will check CBC again today, request records from prior PCP.   ADDENDUM 09/02/2012 11:04 AM  Lab Results  Component Value Date   WBC 6.3 08/30/2012   HGB 12.5* 08/30/2012   HCT 37.1* 08/30/2012   MCV 88.3 08/30/2012   PLT 212 08/30/2012   Hb, plts improving. Records from PCP still pending. Continue to monitor

## 2012-08-30 NOTE — Assessment & Plan Note (Signed)
BP Readings from Last 3 Encounters:  08/30/12 163/90  06/26/12 145/68  09/15/11 144/75    Lab Results  Component Value Date   NA 142 06/26/2012   K 3.6 06/26/2012   CREATININE 1.06 06/26/2012    Assessment: Blood pressure control: moderately elevated Progress toward BP goal:  deteriorated Comments: Has been out of BP meds for weeks  Plan: Medications:  Refilled metoprolol 12.5 BID, benazerpil 20mg  qd Educational resources provided: brochure Self management tools provided: home blood pressure logbook Other plans: Cmet today. RTC in 3 months

## 2012-08-30 NOTE — Assessment & Plan Note (Signed)
Fall Screening 08/30/2012  Falls in the past year? Yes  Number of falls in past year 2 or more  Risk Factor Category  High Fall Risk      Assessment: Progress toward fall prevention goal:  deteriorated Comments: Home alone at times during day, gait unsteady even w rolling walker  Plan: Fall prevention plans: Referral to CSW, [facility] PT Educational resources provided: brochure;handout Self management tools provided: home fall prevention checklist

## 2012-08-30 NOTE — Assessment & Plan Note (Addendum)
Lab Results  Component Value Date   ALT 64* 06/23/2012   AST 107* 06/23/2012   ALKPHOS 72 06/23/2012   BILITOT 0.5 06/23/2012   Mild, noted during hospital admission. Will repeat CMet, request records from prior PCP  ADDENDUM 09/02/2012 11:04 AM  Lab Results  Component Value Date   ALT 13 08/30/2012   AST 18 08/30/2012   ALKPHOS 68 08/30/2012   BILITOT 0.5 08/30/2012   Resolved transamnitis.

## 2012-08-30 NOTE — Progress Notes (Signed)
Patient ID: [name], male   DOB: 12/24/1924, 79 y.o.   MRN: [id number]  Subjective:   Patient ID: [name] male   DOB: 23-May-1924 79 y.o.   MRN: [id number]  HPI: [name] is a 79 y.o. male with history of HTN, prior CVA, memingioma presenting to the clinic to establish care. Recently had hospital admission in mid-April after presenting w weakness, dehydration and UTI. Symptoms resolved w IV hydration and abx. During admission, ectopy noted on telemetry w frequent PVCs, ?multifocal atrial tachycardia. He was started on a beta blocker and outpt f/u arranged w Dr. Sharyn Lull. Also noted to have anemia, thought likely dilutional as well as mild, chronic thrombocytopenia.  Since discharge patient has had home health physical therapy services and subsequently transitioned to outpatient physical therapy. He has regained a lot of his strength, but daughter reports that he continues to have occasional falls at home. He has not been weak or sick like he was prior to his most recent hospitalization, however he still is having falls. The daughter would like more assistance at home helping him with strength and preventing falls. He is fairly independent on the setting can for himself is in serial and get around okay with his walker; however she is not always there during the daytime. [name] and his daughter would also like to discuss health care power of attorney and advanced directives today. The daughter also reports that they followed up with Dr. Sharyn Lull after hospital discharge, and an ultrasound was done of her father's heart. They're not aware of the results of ultrasound but were told that everything was fine. He has been out of his blood pressure medicines for couple of weeks now. In terms of his health maintenance, the daughter reports that she thinks he had his pneumonia shot through Dr. Roselee Nova his office. She is unaware of his tetanus immunization status or prior colonoscopy screening  status. Worsening of hearing loss, wax impaction in both ears, would like cerumen removal. [name] denies any pain anywhere, dizziness, shortness of breath, dysuria, blood in urine or stool, new weakness or numbness.     Past Medical History  Diagnosis Date  . Hypertension   . Difficulty hearing, right   . Stroke     Cerebellar, 2013  . Meningioma   . Frequent falls    Current Outpatient Prescriptions  Medication Sig Dispense Refill  . atorvastatin (LIPITOR) 20 MG tablet Take 1 tablet (20 mg total) by mouth daily at 6 PM.  90 tablet  2  . benazepril (LOTENSIN) 20 MG tablet Take 1 tablet (20 mg total) by mouth daily.  90 tablet  2  . clopidogrel (PLAVIX) 75 MG tablet Take 1 tablet (75 mg total) by mouth daily with breakfast.  90 tablet  2  . metoprolol tartrate (LOPRESSOR) 25 MG tablet Take 0.5 tablets (12.5 mg total) by mouth 2 (two) times daily.  90 tablet  2  . sennosides-docusate sodium (SENOKOT-S) 8.6-50 MG tablet Take 1 tablet by mouth at bedtime as needed. For constipation       No current facility-administered medications for this visit.   Family History  Problem Relation Age of Onset  . Diabetes Mother   . Heart disease Mother    History   Social History  . Marital Status: Widowed    Spouse Name: N/A    Number of Children: N/A  . Years of Education: N/A   Social History Main Topics  . Smoking status: Former  Smoker    Types: Cigarettes  . Smokeless tobacco: None  . Alcohol Use: No  . Drug Use: No  . Sexually Active: None   Other Topics Concern  . None   Social History Narrative   Lives with daughter. Ambulates w walker. Can make basic meals (cereal). Very hard of hearing.    Review of Systems: 10 pt ROS performed, pertinent positives and negatives noted in HPI Objective:  Physical Exam: Filed Vitals:   08/30/12 1024  BP: 163/90  Pulse: 67  Temp: 98.6 F (37 C)  TempSrc: Oral  Height: 6\' 1"  (1.854 m)  Weight: 177 lb 9.6 oz (80.559 kg)  SpO2:  99%   Constitutional: Vital signs reviewed.  Patient is an elderly male in no acute distress and cooperative with exam. Alert and oriented x3. Very hard of hearing.  Head: Normocephalic and atraumatic Ears: cerumen obstructs TM bilatearlly Mouth: no erythema or exudates, MMM Eyes: PERRL, EOMI, conjunctivae normal, No scleral icterus.  Neck: Supple, Trachea midline normal ROM; no JVD, mass, thyromegaly.  Cardiovascular: RRR, S1 normal, S2 normal, no MRG, pulses symmetric and intact bilaterally Pulmonary/Chest: CTAB, no wheezes, rales, or rhonchi Abdominal: Soft. Non-tender, non-distended, bowel sounds are normal, no masses, organomegaly, or guarding present.  GU: no CVA tenderness Musculoskeletal: No joint deformities, erythema, or stiffness  Hematology: no cervical adenopathy or visible bruising Neurological: A&O x3, walks with some unsteadiness with rolling walker, leaning to the side  Skin: Warm, dry and intact. No rash, cyanosis, or clubbing.  Psychiatric: Normal mood and affect. Assessment & Plan:   Please see problem-based charting for assessment and plan.

## 2012-08-30 NOTE — Patient Instructions (Signed)
General Instructions: 1. I have refilled your medicines and sent them to [facility] on [address]. Please take all of your medicines daily. 2. We will call you about setting up home health services. 3. Please come back in 3 months or sooner if needed. 4. I will call you if we need to do anything else to follow up on your labs. Otherwise, I will mail you results in the mail   Treatment Goals:  Goals (1 Years of Data) as of 08/30/12         As of Today 06/26/12 06/26/12 06/26/12 06/26/12     Blood Pressure    . Blood Pressure < 150/90  163/90 145/68 124/78 161/81 137/83     Lifestyle    . Prevent Falls  No          Progress Toward Treatment Goals:  Treatment Goal 08/30/2012  Blood pressure deteriorated  Prevent falls deteriorated    Self Care Goals & Plans:  Self Care Goal 08/30/2012  Manage my medications take my medicines as prescribed; bring my medications to every visit; refill my medications on time  Eat healthy foods drink diet soda or water instead of juice or soda; eat more vegetables; eat foods that are low in salt; eat baked foods instead of fried foods       Care Management & Community Referrals:  Referral 08/30/2012  Referrals made for care management support social worker  Referrals made to community resources exercise/physical therapy

## 2012-09-02 DIAGNOSIS — E210 Primary hyperparathyroidism: Secondary | ICD-10-CM | POA: Insufficient documentation

## 2012-09-02 NOTE — Assessment & Plan Note (Signed)
Labs look good, Ca mildly elevated (10.6), review of Ca from past year show range 10.3-11.1. Pt not symptomatic. May need further w/u for hypercalcemia at next visit, will await records from PCP to see if this has been investigated.

## 2012-09-04 ENCOUNTER — Telehealth: Payer: Self-pay | Admitting: Licensed Clinical Social Worker

## 2012-09-04 NOTE — Telephone Encounter (Signed)
CSW received response from [facility], pt was recently d/c from [facility] and referred to outpatient therapy.  [facility] concern regarding the benefit of HH PT vs Outpt PT.  CSW reviewing information from [employer], as this may be option for pt.  CSW attempt to contact dau to determine if outpatient services were appropriate.

## 2012-09-05 NOTE — Telephone Encounter (Signed)
CSW received return call from daughter requesting call to work #.  CSW called daughter at [employer] provided.  Discussed response from [facility] agency, pt would receive more intensive services through outpatient therapy.  Provided information on the [facility] program through [employer].  Daughter would like referral to Outpt [facility] program.  CSW forwarded request to physician.

## 2012-09-05 NOTE — Addendum Note (Signed)
Addended by: Ignacia Palma on: 09/05/2012 11:49 AM   Modules accepted: Orders

## 2012-09-05 NOTE — Addendum Note (Signed)
Addended by: Neomia Dear on: 09/05/2012 05:29 PM   Modules accepted: Orders

## 2013-02-03 ENCOUNTER — Ambulatory Visit (INDEPENDENT_AMBULATORY_CARE_PROVIDER_SITE_OTHER): Payer: Medicare Other | Admitting: Internal Medicine

## 2013-02-03 ENCOUNTER — Encounter: Payer: Self-pay | Admitting: Internal Medicine

## 2013-02-03 VITALS — BP 162/93 | HR 80 | Temp 97.0°F | Ht 72.0 in | Wt 176.1 lb

## 2013-02-03 DIAGNOSIS — H6120 Impacted cerumen, unspecified ear: Secondary | ICD-10-CM

## 2013-02-03 DIAGNOSIS — Z23 Encounter for immunization: Secondary | ICD-10-CM

## 2013-02-03 DIAGNOSIS — I10 Essential (primary) hypertension: Secondary | ICD-10-CM

## 2013-02-03 DIAGNOSIS — H6123 Impacted cerumen, bilateral: Secondary | ICD-10-CM

## 2013-02-03 MED ORDER — BENAZEPRIL HCL 20 MG PO TABS: 20.0000 mg | ORAL_TABLET | Freq: Every day | ORAL | Status: DC

## 2013-02-03 MED ORDER — CLOPIDOGREL BISULFATE 75 MG PO TABS: 75.0000 mg | ORAL_TABLET | Freq: Every day | ORAL | Status: DC

## 2013-02-03 MED ORDER — ATORVASTATIN CALCIUM 20 MG PO TABS: 20.0000 mg | ORAL_TABLET | Freq: Every day | ORAL | Status: DC

## 2013-02-03 MED ORDER — METOPROLOL TARTRATE 25 MG PO TABS: 25.0000 mg | ORAL_TABLET | Freq: Two times a day (BID) | ORAL | Status: DC

## 2013-02-03 NOTE — Patient Instructions (Addendum)
We have given you the flu shot and pneumonia shot. We have sent in all of your refills for your medicines.   We will increase your blood pressure medicine called metoprolol to 1 pill twice a day.   Come back in about 3 months to see your doctor.  Call us with questions or problems at [phone].

## 2013-02-03 NOTE — Progress Notes (Signed)
Case discussed with Dr. Kollar soon after the resident saw the patient.  We reviewed the resident's history and exam and pertinent patient test results.  I agree with the assessment, diagnosis, and plan of care documented in the resident's note. 

## 2013-02-03 NOTE — Assessment & Plan Note (Signed)
Continue benazepril 20 mg daily and increase metoprolol to 25 mg BID as HR 80 and BP still not at goal. Also difficult for patient to cut pill in half daily.

## 2013-02-03 NOTE — Assessment & Plan Note (Signed)
Did bilateral disimpaction with good results of wax removal. Small ear canals likely make likely to accumulate.

## 2013-02-03 NOTE — Progress Notes (Signed)
Subjective:     Patient ID: [name], male   DOB: June 27, 1924, 79 y.o.   MRN: [id number]  HPI The patient is an 79 YO man with PMH of stroke, HTN, hearing loss, meningioma who is returning to our clinic today for a follow up visit. He has not been seen since June and since then has done fairly well. He is hard of hearing and his daughter thinks that he may have some wax buildup as he does not hear well with the hearing aids anymore. She would like them cleaned out. He denies chest pain, SOB, abdominal pain. He is having normal bowel movements and uses an over the counter stool softener. He denies headaches or neurological symptoms such as numbness or weakness.   Review of Systems  Constitutional: Negative for fever, chills, diaphoresis, activity change, appetite change, fatigue and unexpected weight change.  HENT: Positive for hearing loss.   Respiratory: Negative for cough, chest tightness, shortness of breath and wheezing.   Cardiovascular: Negative for chest pain, palpitations and leg swelling.  Gastrointestinal: Negative for nausea, vomiting, abdominal pain, diarrhea and constipation.  Musculoskeletal: Negative.   Skin: Negative.   Neurological: Negative for dizziness, tremors, seizures, syncope, facial asymmetry, speech difficulty, weakness, light-headedness, numbness and headaches.       Objective:   Physical Exam  Constitutional: He is oriented to person, place, and time. He appears well-developed and well-nourished.  Pleasant and hard of hearing. Hearing aid not in place during today's visit.   HENT:  Head: Normocephalic and atraumatic.  Ear canals narrow bilaterally and full of wax  Eyes: EOM are normal. Pupils are equal, round, and reactive to light.  Neck: Normal range of motion. Neck supple. No JVD present. No tracheal deviation present. No thyromegaly present.  Cardiovascular: Normal rate and regular rhythm.   No murmur heard. Pulmonary/Chest: Effort normal and breath  sounds normal. No respiratory distress. He has no wheezes. He has no rales. He exhibits no tenderness.  Abdominal: Soft. Bowel sounds are normal. He exhibits no distension. There is no tenderness. There is no rebound.  Musculoskeletal: Normal range of motion. He exhibits no edema and no tenderness.  Neurological: He is alert and oriented to person, place, and time. No cranial nerve deficit.  Skin: Skin is warm and dry.       Assessment/Plan:   1. Please see problem oriented charting.  2. Disposition - Will increase metoprolol to 25 mg BID. Will give flu shot and pneumonia shot today. He will return in 3 months. Did bilateral cerumen disimpaction at today's visit.

## 2013-04-17 ENCOUNTER — Emergency Department (HOSPITAL_COMMUNITY): Payer: Medicare Other

## 2013-04-17 ENCOUNTER — Encounter (HOSPITAL_COMMUNITY): Payer: Self-pay | Admitting: Emergency Medicine

## 2013-04-17 ENCOUNTER — Emergency Department (HOSPITAL_COMMUNITY)
Admission: EM | Admit: 2013-04-17 | Discharge: 2013-04-17 | Disposition: A | Discharge status: Home / Self Care | Payer: Medicare Other | Attending: Emergency Medicine | Admitting: Emergency Medicine

## 2013-04-17 DIAGNOSIS — Z7902 Long term (current) use of antithrombotics/antiplatelets: Secondary | ICD-10-CM | POA: Insufficient documentation

## 2013-04-17 DIAGNOSIS — Z79899 Other long term (current) drug therapy: Secondary | ICD-10-CM | POA: Insufficient documentation

## 2013-04-17 DIAGNOSIS — R5381 Other malaise: Secondary | ICD-10-CM | POA: Insufficient documentation

## 2013-04-17 DIAGNOSIS — W19XXXA Unspecified fall, initial encounter: Secondary | ICD-10-CM

## 2013-04-17 DIAGNOSIS — J40 Bronchitis, not specified as acute or chronic: Secondary | ICD-10-CM

## 2013-04-17 DIAGNOSIS — Z043 Encounter for examination and observation following other accident: Secondary | ICD-10-CM | POA: Insufficient documentation

## 2013-04-17 DIAGNOSIS — Z9181 History of falling: Secondary | ICD-10-CM | POA: Insufficient documentation

## 2013-04-17 DIAGNOSIS — Y929 Unspecified place or not applicable: Secondary | ICD-10-CM | POA: Insufficient documentation

## 2013-04-17 DIAGNOSIS — J209 Acute bronchitis, unspecified: Secondary | ICD-10-CM | POA: Insufficient documentation

## 2013-04-17 DIAGNOSIS — I10 Essential (primary) hypertension: Secondary | ICD-10-CM | POA: Insufficient documentation

## 2013-04-17 DIAGNOSIS — Z87891 Personal history of nicotine dependence: Secondary | ICD-10-CM | POA: Insufficient documentation

## 2013-04-17 DIAGNOSIS — Z8669 Personal history of other diseases of the nervous system and sense organs: Secondary | ICD-10-CM | POA: Insufficient documentation

## 2013-04-17 DIAGNOSIS — R011 Cardiac murmur, unspecified: Secondary | ICD-10-CM | POA: Insufficient documentation

## 2013-04-17 DIAGNOSIS — Y939 Activity, unspecified: Secondary | ICD-10-CM | POA: Insufficient documentation

## 2013-04-17 DIAGNOSIS — W06XXXA Fall from bed, initial encounter: Secondary | ICD-10-CM | POA: Insufficient documentation

## 2013-04-17 DIAGNOSIS — Z8673 Personal history of transient ischemic attack (TIA), and cerebral infarction without residual deficits: Secondary | ICD-10-CM | POA: Insufficient documentation

## 2013-04-17 DIAGNOSIS — R5383 Other fatigue: Secondary | ICD-10-CM

## 2013-04-17 LAB — COMPREHENSIVE METABOLIC PANEL
ALT: 16 U/L (ref 0–53)
AST: 23 U/L (ref 0–37)
Albumin: 3.6 g/dL (ref 3.5–5.2)
Alkaline Phosphatase: 68 U/L (ref 39–117)
BUN: 15 mg/dL (ref 6–23)
CO2: 27 mEq/L (ref 19–32)
Calcium: 10.5 mg/dL (ref 8.4–10.5)
Chloride: 108 mEq/L (ref 96–112)
Creatinine, Ser: 1.02 mg/dL (ref 0.50–1.35)
GFR calc Af Amer: 75 mL/min — ABNORMAL LOW (ref >90)
GFR calc non Af Amer: 64 mL/min — ABNORMAL LOW (ref >90)
Glucose, Bld: 142 mg/dL — ABNORMAL HIGH (ref 70–99)
Potassium: 3.5 mEq/L — ABNORMAL LOW (ref 3.7–5.3)
Sodium: 147 mEq/L (ref 137–147)
Total Bilirubin: 0.4 mg/dL (ref 0.3–1.2)
Total Protein: 7.1 g/dL (ref 6.0–8.3)

## 2013-04-17 LAB — URINALYSIS, ROUTINE W REFLEX MICROSCOPIC
Bilirubin Urine: NEGATIVE
Glucose, UA: NEGATIVE mg/dL
Ketones, ur: 15 mg/dL — AB
Leukocytes, UA: NEGATIVE
Nitrite: NEGATIVE
Protein, ur: 30 mg/dL — AB
Specific Gravity, Urine: 1.019 (ref 1.005–1.030)
Urobilinogen, UA: 0.2 mg/dL (ref 0.0–1.0)
pH: 7.5 (ref 5.0–8.0)

## 2013-04-17 LAB — CBC WITH DIFFERENTIAL/PLATELET
Basophils Absolute: 0 10*3/uL (ref 0.0–0.1)
Basophils Relative: 0 % (ref 0–1)
Eosinophils Absolute: 0 10*3/uL (ref 0.0–0.7)
Eosinophils Relative: 0 % (ref 0–5)
HCT: 36.5 % — ABNORMAL LOW (ref 39.0–52.0)
Hemoglobin: 12.5 g/dL — ABNORMAL LOW (ref 13.0–17.0)
Lymphocytes Relative: 10 % — ABNORMAL LOW (ref 12–46)
Lymphs Abs: 0.5 10*3/uL — ABNORMAL LOW (ref 0.7–4.0)
MCH: 31.4 pg (ref 26.0–34.0)
MCHC: 34.2 g/dL (ref 30.0–36.0)
MCV: 91.7 fL (ref 78.0–100.0)
Monocytes Absolute: 0.4 10*3/uL (ref 0.1–1.0)
Monocytes Relative: 7 % (ref 3–12)
Neutro Abs: 4.2 10*3/uL (ref 1.7–7.7)
Neutrophils Relative %: 83 % — ABNORMAL HIGH (ref 43–77)
Platelets: 105 10*3/uL — ABNORMAL LOW (ref 150–400)
RBC: 3.98 MIL/uL — ABNORMAL LOW (ref 4.22–5.81)
RDW: 13.2 % (ref 11.5–15.5)
WBC: 5.1 10*3/uL (ref 4.0–10.5)

## 2013-04-17 LAB — POCT I-STAT, CHEM 8
BUN: 14 mg/dL (ref 6–23)
Calcium, Ion: 1.44 mmol/L — ABNORMAL HIGH (ref 1.13–1.30)
Chloride: 106 mEq/L (ref 96–112)
Creatinine, Ser: 1.2 mg/dL (ref 0.50–1.35)
Glucose, Bld: 144 mg/dL — ABNORMAL HIGH (ref 70–99)
HCT: 37 % — ABNORMAL LOW (ref 39.0–52.0)
Hemoglobin: 12.6 g/dL — ABNORMAL LOW (ref 13.0–17.0)
Potassium: 3.2 mEq/L — ABNORMAL LOW (ref 3.7–5.3)
Sodium: 146 mEq/L (ref 137–147)
TCO2: 27 mmol/L (ref 0–100)

## 2013-04-17 LAB — URINE MICROSCOPIC-ADD ON

## 2013-04-17 LAB — CG4 I-STAT (LACTIC ACID): Lactic Acid, Venous: 1.45 mmol/L (ref 0.5–2.2)

## 2013-04-17 LAB — INFLUENZA PANEL BY PCR (TYPE A & B)
H1N1 flu by pcr: NOT DETECTED
Influenza A By PCR: POSITIVE — AB
Influenza B By PCR: NEGATIVE

## 2013-04-17 MED ORDER — SODIUM CHLORIDE 0.9 % IV SOLN
1000.0000 mL | Freq: Once | INTRAVENOUS | Status: AC
  Administered 2013-04-17: 1000 mL via INTRAVENOUS

## 2013-04-17 MED ORDER — VANCOMYCIN HCL IN DEXTROSE 1-5 GM/200ML-% IV SOLN: 1000.0000 mg | INTRAVENOUS | Status: DC

## 2013-04-17 MED ORDER — DOXYCYCLINE HYCLATE 100 MG PO CAPS: 100.0000 mg | ORAL_CAPSULE | Freq: Two times a day (BID) | ORAL | Status: DC

## 2013-04-17 MED ORDER — PIPERACILLIN-TAZOBACTAM 3.375 G IVPB 30 MIN
3.3750 g | Freq: Once | INTRAVENOUS | Status: AC
  Administered 2013-04-17: 3.375 g via INTRAVENOUS
  Filled 2013-04-17: qty 50

## 2013-04-17 MED ORDER — SODIUM CHLORIDE 0.9 % IV SOLN
1000.0000 mL | INTRAVENOUS | Status: DC
Start: 2013-04-17 — End: 2013-04-17
  Administered 2013-04-17: 1000 mL via INTRAVENOUS

## 2013-04-17 MED ORDER — VANCOMYCIN HCL IN DEXTROSE 1-5 GM/200ML-% IV SOLN
1000.0000 mg | Freq: Once | INTRAVENOUS | Status: AC
  Administered 2013-04-17: 1000 mg via INTRAVENOUS
  Filled 2013-04-17: qty 200

## 2013-04-17 MED ORDER — PIPERACILLIN-TAZOBACTAM 3.375 G IVPB: 3.3750 g | Freq: Three times a day (TID) | INTRAVENOUS | Status: DC

## 2013-04-17 NOTE — Progress Notes (Signed)
ANTIBIOTIC CONSULT NOTE - INITIAL  Pharmacy Consult for vancomycin and zosyn Indication: rule out sepsis  No Known Allergies  Patient Measurements:   Adjusted Body Weight: 80kg  Vital Signs: BP: 139/78 mmHg (02/05 1123) Intake/Output from previous day:   Intake/Output from this shift:    Labs:  Recent Labs  04/17/13 1156  HGB 12.6*  CREATININE 1.20   The CrCl is unknown because both a height and weight (above a minimum accepted value) are required for this calculation. No results found for this basename: VANCOTROUGH, VANCOPEAK, VANCORANDOM, GENTTROUGH, GENTPEAK, GENTRANDOM, TOBRATROUGH, TOBRAPEAK, TOBRARND, AMIKACINPEAK, AMIKACINTROU, AMIKACIN,  in the last 72 hours   Microbiology: No results found for this or any previous visit (from the past 720 hour(s)).  Medical History: Past Medical History  Diagnosis Date  . Hypertension   . Difficulty hearing, right   . Stroke     Cerebellar, 2013  . Meningioma   . Frequent falls     Medications:  See home med list Assessment: 80 year old man s/p mechanical fall found to be febrile.  Vancomycin and Zosyn started per sepsis protocol.    Goal of Therapy:  Vancomycin trough level 15-20 mcg/ml  Plan:  Measure antibiotic drug levels at steady state Follow up culture results Vancomycin 1g and Zosyn 3.375g x 1 ordered in ED Will follow with vancomycin 1g IV daily and Zosyn 3.375g IV q8h (infuse over 4 hours) Monitor renal function  Candie Mile 04/17/2013,12:02 PM

## 2013-04-17 NOTE — ED Notes (Signed)
Pt given PO fluids, pt tolerated well. No coughing or n/v.

## 2013-04-17 NOTE — ED Notes (Signed)
Lactic acid results called to [name]

## 2013-04-17 NOTE — ED Notes (Signed)
rec'd call from [facility] in Lab- lactic acid was 1.45.

## 2013-04-17 NOTE — ED Provider Notes (Signed)
CSN: [id number]     Arrival date & time 04/17/13  1058 History   First MD Initiated Contact with Patient 04/17/13 1106     Chief Complaint  Patient presents with  . Fall  . Fever   (Consider location/radiation/quality/duration/timing/severity/associated sxs/prior Treatment) HPI Comments: EMS called for mechanical fall after patient rolled out of bed. No loss of consciousness. Patient denies any pain. Found to be febrile to 102 with a moist productive cough. He denies any chest pain, shortness of breath, abdominal pain, nausea or vomiting. Denies any focal weakness, numbness or tingling. No family is available. He is on plavix.  The history is provided by the patient and the EMS personnel.    Past Medical History  Diagnosis Date  . Hypertension   . Difficulty hearing, right   . Stroke     Cerebellar, 2013  . Meningioma   . Frequent falls    Past Surgical History  Procedure Laterality Date  . Ear tube removal     Family History  Problem Relation Age of Onset  . Diabetes Mother   . Heart disease Mother    History  Substance Use Topics  . Smoking status: Former Smoker    Types: Cigarettes  . Smokeless tobacco: Not on file  . Alcohol Use: No    Review of Systems  Constitutional: Positive for fever. Negative for activity change and appetite change.  Respiratory: Positive for cough. Negative for chest tightness and shortness of breath.   Cardiovascular: Negative for chest pain.  Gastrointestinal: Negative for nausea, vomiting and abdominal pain.  Genitourinary: Negative for dysuria and hematuria.  Musculoskeletal: Negative for back pain.  Skin: Negative for rash.  Neurological: Positive for weakness. Negative for dizziness and headaches.  A complete 10 system review of systems was obtained and all systems are negative except as noted in the HPI and PMH.    Allergies  Review of patient's allergies indicates no known allergies.  Home Medications   Current Outpatient Rx   Name  Route  Sig  Dispense  Refill  . atorvastatin (LIPITOR) 20 MG tablet   Oral   Take 1 tablet (20 mg total) by mouth daily at 6 PM.   90 tablet   2   . benazepril (LOTENSIN) 20 MG tablet   Oral   Take 1 tablet (20 mg total) by mouth daily.   90 tablet   2   . clopidogrel (PLAVIX) 75 MG tablet   Oral   Take 1 tablet (75 mg total) by mouth daily with breakfast.   90 tablet   2   . metoprolol tartrate (LOPRESSOR) 25 MG tablet   Oral   Take 1 tablet (25 mg total) by mouth 2 (two) times daily.   180 tablet   2   . doxycycline (VIBRAMYCIN) 100 MG capsule   Oral   Take 1 capsule (100 mg total) by mouth 2 (two) times daily.   20 capsule   0    BP 150/79  Pulse 71  Temp(Src) 99.5 F (37.5 C) (Rectal)  Resp 12  SpO2 96% Physical Exam  Constitutional: He is oriented to person, place, and time. He appears well-developed and well-nourished. No distress.  HENT:  Head: Normocephalic and atraumatic.  Mouth/Throat: Oropharynx is clear and moist. No oropharyngeal exudate.  Eyes: Conjunctivae and EOM are normal. Pupils are equal, round, and reactive to light.  Neck: Normal range of motion. Neck supple.  No C spine pain  Cardiovascular: Normal rate and regular rhythm.  Murmur heard. Pulmonary/Chest: Effort normal and breath sounds normal. No respiratory distress. He exhibits no tenderness.  Abdominal: Soft. Bowel sounds are normal. There is no tenderness. There is no rebound and no guarding.  Musculoskeletal: Normal range of motion. He exhibits no edema and no tenderness.  No T or L spine tenderness 5/5 strength in bilateral lower extremities. Ankle plantar and dorsiflexion intact. Great toe extension intact bilaterally. +2 DP and PT pulses. +2 patellar reflexes bilaterally. Normal gait.   Neurological: He is alert and oriented to person, place, and time. No cranial nerve deficit. He exhibits normal muscle tone. Coordination normal.  Skin: Skin is warm.    ED Course   Procedures (including critical care time) Labs Review Labs Reviewed  CBC WITH DIFFERENTIAL - Abnormal; Notable for the following:    RBC 3.98 (*)    Hemoglobin 12.5 (*)    HCT 36.5 (*)    Platelets 105 (*)    Neutrophils Relative % 83 (*)    Lymphocytes Relative 10 (*)    Lymphs Abs 0.5 (*)    All other components within normal limits  COMPREHENSIVE METABOLIC PANEL - Abnormal; Notable for the following:    Potassium 3.5 (*)    Glucose, Bld 142 (*)    GFR calc non Af Amer 64 (*)    GFR calc Af Amer 75 (*)    All other components within normal limits  URINALYSIS, ROUTINE W REFLEX MICROSCOPIC - Abnormal; Notable for the following:    APPearance TURBID (*)    Hgb urine dipstick MODERATE (*)    Ketones, ur 15 (*)    Protein, ur 30 (*)    All other components within normal limits  URINE MICROSCOPIC-ADD ON - Abnormal; Notable for the following:    Squamous Epithelial / LPF FEW (*)    Bacteria, UA FEW (*)    All other components within normal limits  POCT I-STAT, CHEM 8 - Abnormal; Notable for the following:    Potassium 3.2 (*)    Glucose, Bld 144 (*)    Calcium, Ion 1.44 (*)    Hemoglobin 12.6 (*)    HCT 37.0 (*)    All other components within normal limits  CULTURE, BLOOD (ROUTINE X 2)  CULTURE, BLOOD (ROUTINE X 2)  URINE CULTURE  INFLUENZA PANEL BY PCR (TYPE A & B, H1N1)  CG4 I-STAT (LACTIC ACID)   Imaging Review Ct Head Wo Contrast  04/17/2013   CLINICAL DATA:  80 year old male status post fall. Fever with temperature of a 102 degrees per EMS. Initial encounter.  EXAM: CT HEAD WITHOUT CONTRAST  TECHNIQUE: Contiguous axial images were obtained from the base of the skull through the vertex without intravenous contrast.  COMPARISON:  06/23/2012 and earlier.  FINDINGS: Paranasal sinuses are stable and largely clear. Visualized orbit soft tissues are within normal limits. Mastoids and tympanic cavities are clear. No scalp hematoma identified. Stable calvarium.  Chronic left  sphenoid wing speckled hyperdense meningioma is stable measuring 27-28 mm diameter. Stable surrounding white matter hypodensity, superimposed on more confluent bilateral white matter hypodensity. No significant regional mass effect is evident. Stable ventricle size and configuration. No midline shift. Dural calcifications. Moderate to large chronic right cerebellar infarct. Small chronic lacunar infarcts in the left cerebellum. Small to moderate chronic right PCA infarct. Stable gray-white matter differentiation throughout the brain. No acute intracranial hemorrhage identified. No suspicious intracranial vascular hyperdensity.  IMPRESSION: 1.  No acute intracranial abnormality. 2. Chronic left sphenoid wing meningioma. Advanced chronic posterior  circulation ischemia.   Electronically Signed   By: Lars Pinks M.D.   On: 04/17/2013 13:23   Dg Chest Port 1 View  04/17/2013   CLINICAL DATA:  Sepsis, fever  EXAM: PORTABLE CHEST - 1 VIEW  COMPARISON:  June 23, 2012  FINDINGS: The heart size and mediastinal contours are stable. The aorta is tortuous. The heart size is upper limits of normal. There is no focal infiltrate, pulmonary edema, or pleural effusion. The visualized skeletal structures are stable.  IMPRESSION: No active cardiopulmonary disease.   Electronically Signed   By: Abelardo Diesel M.D.   On: 04/17/2013 11:52    EKG Interpretation    Date/Time:  Thursday April 17 2013 11:23:26 EST Ventricular Rate:  96 PR Interval:  199 QRS Duration: 112 QT Interval:  327 QTC Calculation: 413 R Axis:   -10 Text Interpretation:  Sinus tachycardia Atrial premature complexes Borderline intraventricular conduction delay Borderline repolarization abnormality No significant change was found Confirmed by Wyvonnia Dusky  MD, Naylin Burkle ([id number]) on 04/17/2013 12:19:41 PM            MDM   1. Fall    Mechanical fall with fever. No neurological deficits. Febrile to 102 tympanically for EMS. Patient denies any complaints.  No chest, abdominal, back pain.  Family arrives and states patient has history of prostate problems and is scheduled for surgery. He is at neurological baseline. Chest x-ray is negative for infiltrate. Urinalysis is negative for infection. CT head is stable.  No evidence of infection. Patient appears nontoxic. Temperature on ED arrival 99.5. Lactate normal.  No tachycardia. No hypoxia.  Will treat for bronchitis in setting of fever, cough, smoking history.  Patient is tolerating by mouth. He is ambulatory with assistance which is his baseline.   Ezequiel Essex, MD 04/17/13 [phone]

## 2013-04-17 NOTE — ED Notes (Signed)
Pt ambulated, a little unsteady but reports he normally uses a walker.

## 2013-04-17 NOTE — Discharge Instructions (Signed)

## 2013-04-17 NOTE — ED Notes (Addendum)
Per EMS, pt fell off his bed this AM, family helped back to bed and noticed pt felt hot. EMS tympanic temperature of 102, EMS gave 1000mg  Tylenol. EMS reports pt has had a cough and noted a foul smell to urine.

## 2013-04-17 NOTE — ED Notes (Signed)
Called pt daughter to inform pt up for d/c. Was told she will be here to pick him up in 30-45 mins.

## 2013-04-18 LAB — URINE CULTURE
Colony Count: NO GROWTH
Culture: NO GROWTH

## 2013-04-23 LAB — CULTURE, BLOOD (ROUTINE X 2)
Culture: NO GROWTH
Culture: NO GROWTH

## 2013-04-24 ENCOUNTER — Ambulatory Visit (INDEPENDENT_AMBULATORY_CARE_PROVIDER_SITE_OTHER): Payer: Medicare Other | Admitting: Internal Medicine

## 2013-04-24 ENCOUNTER — Encounter: Payer: Self-pay | Admitting: Internal Medicine

## 2013-04-24 VITALS — BP 160/84 | HR 61 | Temp 97.3°F | Ht 72.0 in | Wt 168.9 lb

## 2013-04-24 DIAGNOSIS — N400 Enlarged prostate without lower urinary tract symptoms: Secondary | ICD-10-CM

## 2013-04-24 DIAGNOSIS — Z01818 Encounter for other preprocedural examination: Secondary | ICD-10-CM

## 2013-04-24 DIAGNOSIS — I10 Essential (primary) hypertension: Secondary | ICD-10-CM

## 2013-04-24 DIAGNOSIS — D649 Anemia, unspecified: Secondary | ICD-10-CM

## 2013-04-24 DIAGNOSIS — E210 Primary hyperparathyroidism: Secondary | ICD-10-CM

## 2013-04-24 DIAGNOSIS — D696 Thrombocytopenia, unspecified: Secondary | ICD-10-CM

## 2013-04-24 LAB — COMPLETE METABOLIC PANEL WITH GFR
ALT: 28 U/L (ref 0–53)
AST: 37 U/L (ref 0–37)
Albumin: 3.4 g/dL — ABNORMAL LOW (ref 3.5–5.2)
Alkaline Phosphatase: 56 U/L (ref 39–117)
BUN: 15 mg/dL (ref 6–23)
CO2: 32 mEq/L (ref 19–32)
Calcium: 10.7 mg/dL — ABNORMAL HIGH (ref 8.4–10.5)
Chloride: 108 mEq/L (ref 96–112)
Creat: 0.94 mg/dL (ref 0.50–1.35)
GFR, Est African American: 85 mL/min
GFR, Est Non African American: 73 mL/min
Glucose, Bld: 113 mg/dL — ABNORMAL HIGH (ref 70–99)
Potassium: 3.6 mEq/L (ref 3.5–5.3)
Sodium: 146 mEq/L — ABNORMAL HIGH (ref 135–145)
Total Bilirubin: 0.6 mg/dL (ref 0.2–1.2)
Total Protein: 6.2 g/dL (ref 6.0–8.3)

## 2013-04-24 LAB — CBC
HCT: 35.4 % — ABNORMAL LOW (ref 39.0–52.0)
Hemoglobin: 12.4 g/dL — ABNORMAL LOW (ref 13.0–17.0)
MCH: 30.6 pg (ref 26.0–34.0)
MCHC: 35 g/dL (ref 30.0–36.0)
MCV: 87.4 fL (ref 78.0–100.0)
Platelets: 138 10*3/uL — ABNORMAL LOW (ref 150–400)
RBC: 4.05 MIL/uL — ABNORMAL LOW (ref 4.22–5.81)
RDW: 13.9 % (ref 11.5–15.5)
WBC: 6 10*3/uL (ref 4.0–10.5)

## 2013-04-24 NOTE — Assessment & Plan Note (Addendum)
[name] has estimated risk of perioperative myocardial infarction or cardiac arrest of <1% (low risk) by [employer] National Surgical Quality Improvement Program risk (ACS-NSQIP) model. He has a revised goldman cardiac risk index of cardiac death, nonfatal myocardial infarction and nonfatal cardiac arrest of 1% (Hx of cerebrovascular disease) He does have some limitations with his ADLs and is not completely independent making surgery a slightly higher risk however his TURP may very well benefit his quality of life. His BP today is only slightly above goal and was at goal last week in the ED, will not make alterations of his BP medication.  Will recheck a CMP, and CBC as he had mild hypokalemia and hypercalcemia.  Will supplement potassium if needed to optimize him for surgery.   As patient has no history of CHF (only history of Grade 1 diastolic dysfunction) would recommend keeping patient hydrated perioperatively.   ADDENDUM: Anemia and thrombocytopenia stable, patient mildly hypercalcemic but asymptomatic. PTH inappropriately normal (primary hypercalcemia).  Hypokalemia resolved. Patient optimized for surgery. Patient should discontinue Plavix 5 days prior to surgery and resume following.

## 2013-04-24 NOTE — Progress Notes (Signed)
[facility]   Subjective:   Patient ID: [name] male   DOB: 09/16/1924 80 y.o.   MRN: [id number]  HPI: [name] is a 80 y.o. male with PMH of HTN, BPH, CVA, CKD stage 2, Mild anemia, Thrombocytopenia. He presents today accompanied by his nephew for a preoperative examination for TURP with Dr. Gaynelle Arabian. Of note 1 week ago he was seen at [facility] after a fall, at that encounter he was found to have a fever and cough and was treated for presumed bronchitis with doxycycline (his Influenza A PCR however was positive).  These symptoms have sense resolved and he currently denies any cough.  During that ED visit labwork was obtained that showed mild hypokalemia and mild hypercalcemia.  His EKG was unchanged from previous. Since his stroke he has had a number of falls and his nephew reports he does require some assistance at times moving around the house.  Most days he does dress himself and he does bathe himself. He is able to make simple meals like cereal but does not cook.  He is able to use the restroom without assistance but does have some urinary incontinence and occasionally wets the bed.  He does enjoy his interactions with his family members and appears to be in a pleasant mood. He has no known history of CAD or arrhythmias (he did have some ectomy on telemetry but never captured A fib during his hospitalization in 2014) however he was started on Metoprolol tartrate at that time, he has no history of CHF although a TTE of his chest did show grade 1 diastolic dysfunction, LVEF 55-60%.  He does have CKD Stage 2.  He is taking atorvastatin and plavix after his CVA.  Past Medical History  Diagnosis Date  . Hypertension   . Difficulty hearing, right   . Stroke     Cerebellar, 2013  . Meningioma   . Frequent falls    Current Outpatient Prescriptions  Medication Sig Dispense Refill  . atorvastatin (LIPITOR) 20 MG tablet Take 1 tablet (20 mg total) by mouth daily at 6 PM.   90 tablet  2  . benazepril (LOTENSIN) 20 MG tablet Take 1 tablet (20 mg total) by mouth daily.  90 tablet  2  . clopidogrel (PLAVIX) 75 MG tablet Take 1 tablet (75 mg total) by mouth daily with breakfast.  90 tablet  2  . doxycycline (VIBRAMYCIN) 100 MG capsule Take 1 capsule (100 mg total) by mouth 2 (two) times daily.  20 capsule  0  . metoprolol tartrate (LOPRESSOR) 25 MG tablet Take 1 tablet (25 mg total) by mouth 2 (two) times daily.  180 tablet  2   No current facility-administered medications for this visit.   Family History  Problem Relation Age of Onset  . Diabetes Mother   . Heart disease Mother    History   Social History  . Marital Status: Widowed    Spouse Name: N/A    Number of Children: N/A  . Years of Education: N/A   Social History Main Topics  . Smoking status: Former Smoker    Types: Cigarettes  . Smokeless tobacco: None  . Alcohol Use: No  . Drug Use: No  . Sexual Activity: None   Other Topics Concern  . None   Social History Narrative   Lives with daughter. Ambulates w walker. Can make basic meals (cereal). Very hard of hearing.    Review of Systems: Review of Systems  Constitutional:  Negative for fever, chills and weight loss.  HENT:       Hard of hearing  Eyes: Negative for blurred vision.  Respiratory: Negative for cough, sputum production and shortness of breath.   Cardiovascular: Negative for chest pain, palpitations and leg swelling.  Gastrointestinal: Positive for constipation. Negative for heartburn, nausea, vomiting, abdominal pain and diarrhea.  Genitourinary: Positive for frequency. Negative for dysuria, urgency and flank pain.       +incontence  Musculoskeletal: Negative for back pain and myalgias.  Skin: Negative for rash.  Neurological: Negative for dizziness, focal weakness and headaches.  Psychiatric/Behavioral: Negative for depression. The patient is not nervous/anxious.      Objective:  Physical Exam: Filed Vitals:    04/24/13 1557  BP: 160/84  Pulse: 61  Temp: 97.3 F (36.3 C)  TempSrc: Oral  Height: 6' (1.829 m)  Weight: 168 lb 14.4 oz (76.613 kg)  SpO2: 98%   Physical Exam  Nursing note and vitals reviewed. Constitutional: No distress.  Frail man, in wheelchair, HOH  HENT:  Head: Normocephalic and atraumatic.  Eyes: EOM are normal. Pupils are equal, round, and reactive to light.  Neck: Normal range of motion. Neck supple.  Cardiovascular: Normal rate and regular rhythm.   Murmur (2/6 systolic murmur) heard. Pulmonary/Chest: Effort normal and breath sounds normal. No respiratory distress. He has no wheezes. He has no rales.  Abdominal: Soft. He exhibits no distension and no mass. There is no tenderness. There is no rebound.  Musculoskeletal: He exhibits no edema.  Skin: Skin is warm and dry. He is not diaphoretic.  Psychiatric: Affect normal.     Assessment & Plan:   See Problem Based Assessment and Plan No orders of the defined types were placed in this encounter.    Orders Placed This Encounter  Procedures  . CBC no Diff  . CMP with Estimated GFR ([id number])  . Parathyroid Hormone, Intact w/Ca

## 2013-04-24 NOTE — Assessment & Plan Note (Addendum)
Patient has stable mild anemia and thrombocytopenia, will repeat CBC today to evaulate for pending surgery.  Given patient's age, comorbidities, and decreased ADL will not agressively work up his chronic anemia and thrombocytopenia. ADDENDUM: Anemia and thrombocytopenia stable. CBC    Component Value Date/Time   WBC 6.0 04/24/2013 1648   RBC 4.05* 04/24/2013 1648   HGB 12.4* 04/24/2013 1648   HCT 35.4* 04/24/2013 1648   PLT 138* 04/24/2013 1648   MCV 87.4 04/24/2013 1648   MCH 30.6 04/24/2013 1648   MCHC 35.0 04/24/2013 1648   RDW 13.9 04/24/2013 1648   LYMPHSABS 0.5* 04/17/2013 1130   MONOABS 0.4 04/17/2013 1130   EOSABS 0.0 04/17/2013 1130   BASOSABS 0.0 04/17/2013 1130

## 2013-04-24 NOTE — Assessment & Plan Note (Signed)
BP Readings from Last 3 Encounters:  04/24/13 160/84  04/17/13 160/80  02/03/13 162/93    Lab Results  Component Value Date   NA 146* 04/24/2013   K 3.6 04/24/2013   CREATININE 0.94 04/24/2013    Assessment: Blood pressure control: mildly elevated Progress toward BP goal:  unchanged Comments: only mildly above goal <150/90  Plan: Medications:  continue current medications, Benazepril 20mg  daily, Metoprolol 25mg  BID Educational resources provided:   Self management tools provided:   Other plans:

## 2013-04-24 NOTE — Assessment & Plan Note (Addendum)
Noted on ED labs, has had mild hypercalcemia and mild hyperparathyroidism in past.  Will check CMP and PTH today.  Given patient's age, comorbities, and limitations in functional status will not try aggressive workup.  Patinet is currently asymptomatic.  Discussed this plan with patient's daughter Loretha Brasil who agrees with plan. ADDENDUM: Corrected Ca++ 11.2, PTH 47.3 (inappropriately normal) patient has primary asymptomatic hypercalcemia. Will add moderate Vit D supplementation at follow up visit and obtain DEXA to eval for osteopenia/porosis.

## 2013-04-24 NOTE — Patient Instructions (Signed)
I will call you for any lab abnormalities for the test that we are running today.  Continue current medications, I will send my note with recommendations to the your surgeon.

## 2013-04-24 NOTE — Assessment & Plan Note (Signed)
Plan for likely TURP with Dr. Hartley Barefoot.

## 2013-04-25 ENCOUNTER — Other Ambulatory Visit: Payer: Self-pay | Admitting: Internal Medicine

## 2013-04-25 DIAGNOSIS — E210 Primary hyperparathyroidism: Secondary | ICD-10-CM

## 2013-04-25 LAB — PTH, INTACT AND CALCIUM
Calcium: 10.4 mg/dL (ref 8.4–10.5)
PTH: 47.3 pg/mL (ref 14.0–72.0)

## 2013-04-25 NOTE — Progress Notes (Signed)
Case discussed with Dr. Hoffman at the time of the visit.  We reviewed the resident's history and exam and pertinent patient test results.  I agree with the assessment, diagnosis, and plan of care documented in the resident's note. 

## 2013-05-01 ENCOUNTER — Other Ambulatory Visit: Payer: Self-pay | Admitting: Urology

## 2013-05-14 ENCOUNTER — Encounter (HOSPITAL_COMMUNITY): Payer: Self-pay | Admitting: Pharmacy Technician

## 2013-05-16 ENCOUNTER — Encounter (HOSPITAL_COMMUNITY): Payer: Self-pay

## 2013-05-16 ENCOUNTER — Encounter (HOSPITAL_COMMUNITY)
Admission: RE | Admit: 2013-05-16 | Discharge: 2013-05-16 | Disposition: A | Discharge status: Home / Self Care | Payer: Medicare Other | Source: Ambulatory Visit | Attending: Urology | Admitting: Urology

## 2013-05-16 DIAGNOSIS — Z01812 Encounter for preprocedural laboratory examination: Secondary | ICD-10-CM | POA: Insufficient documentation

## 2013-05-16 HISTORY — DX: Benign prostatic hyperplasia without lower urinary tract symptoms: N40.0

## 2013-05-16 HISTORY — DX: Anemia, unspecified: D64.9

## 2013-05-16 HISTORY — DX: Unspecified urinary incontinence: R32

## 2013-05-16 HISTORY — DX: Chronic kidney disease, unspecified: N18.9

## 2013-05-16 HISTORY — DX: Thrombocytopenia, unspecified: D69.6

## 2013-05-16 LAB — BASIC METABOLIC PANEL
BUN: 14 mg/dL (ref 6–23)
CO2: 31 mEq/L (ref 19–32)
Calcium: 11.1 mg/dL — ABNORMAL HIGH (ref 8.4–10.5)
Chloride: 106 mEq/L (ref 96–112)
Creatinine, Ser: 1.03 mg/dL (ref 0.50–1.35)
GFR calc Af Amer: 74 mL/min — ABNORMAL LOW (ref >90)
GFR calc non Af Amer: 64 mL/min — ABNORMAL LOW (ref >90)
Glucose, Bld: 118 mg/dL — ABNORMAL HIGH (ref 70–99)
Potassium: 3.6 mEq/L — ABNORMAL LOW (ref 3.7–5.3)
Sodium: 146 mEq/L (ref 137–147)

## 2013-05-16 LAB — CBC
HCT: 36.7 % — ABNORMAL LOW (ref 39.0–52.0)
Hemoglobin: 12.2 g/dL — ABNORMAL LOW (ref 13.0–17.0)
MCH: 30.9 pg (ref 26.0–34.0)
MCHC: 33.2 g/dL (ref 30.0–36.0)
MCV: 92.9 fL (ref 78.0–100.0)
Platelets: 115 10*3/uL — ABNORMAL LOW (ref 150–400)
RBC: 3.95 MIL/uL — ABNORMAL LOW (ref 4.22–5.81)
RDW: 13.3 % (ref 11.5–15.5)
WBC: 5.9 10*3/uL (ref 4.0–10.5)

## 2013-05-16 NOTE — Progress Notes (Signed)
05/16/13 1455  OBSTRUCTIVE SLEEP APNEA  Have you ever been diagnosed with sleep apnea through a sleep study? No  Do you snore loudly (loud enough to be heard through closed doors)?  0  Do you often feel tired, fatigued, or sleepy during the daytime? 1  Has anyone observed you stop breathing during your sleep? 0  Do you have, or are you being treated for high blood pressure? 1  BMI more than 35 kg/m2? 0  Age over 78 years old? 1  Neck circumference greater than 40 cm/18 inches? 0  Gender: 1  Obstructive Sleep Apnea Score 4  Score 4 or greater  Results sent to PCP

## 2013-05-16 NOTE — Patient Instructions (Signed)
STOP PLAVIX - DR. TANNENBAUM AWARE YOU TOOK TODAY Friday 3/6 - DR. TANNENBAUM STATES NOT TO TAKE ANY MORE PLAVIX.  YOUR SURGERY IS SCHEDULED AT [facility]  ON:  Monday  3/9  REPORT TO  SHORT STAY CENTER AT:  9:00 AM      PHONE # FOR SHORT STAY IS [phone]  DO NOT EAT OR DRINK ANYTHING AFTER MIDNIGHT THE NIGHT BEFORE YOUR SURGERY.  YOU MAY BRUSH YOUR TEETH, RINSE OUT YOUR MOUTH--BUT NO WATER, NO FOOD, NO CHEWING GUM, NO MINTS, NO CANDIES, NO CHEWING TOBACCO.  PLEASE TAKE THE FOLLOWING MEDICATIONS THE AM OF YOUR SURGERY WITH A FEW SIPS OF WATER:  METOPROLOL   DO NOT BRING VALUABLES, MONEY, CREDIT CARDS.  DO NOT WEAR JEWELRY, MAKE-UP, NAIL POLISH AND NO METAL PINS OR CLIPS IN YOUR HAIR. CONTACT LENS, DENTURES / PARTIALS, GLASSES SHOULD NOT BE WORN TO SURGERY AND IN MOST CASES-HEARING AIDS WILL NEED TO BE REMOVED.  BRING YOUR GLASSES CASE, ANY EQUIPMENT NEEDED FOR YOUR CONTACT LENS. FOR PATIENTS ADMITTED TO THE HOSPITAL--CHECK OUT TIME THE DAY OF DISCHARGE IS 11:00 AM.  ALL INPATIENT ROOMS ARE PRIVATE - WITH BATHROOM, TELEPHONE, TELEVISION AND WIFI INTERNET.                                                   FAILURE TO FOLLOW THESE INSTRUCTIONS MAY RESULT IN THE CANCELLATION OF YOUR SURGERY. PLEASE BE AWARE THAT YOU MAY NEED ADDITIONAL BLOOD DRAWN DAY OF YOUR SURGERY  PATIENT SIGNATURE_________________________________

## 2013-05-16 NOTE — Pre-Procedure Instructions (Signed)
PT'S PREOP CBC AND BMET REPORTS ABNORMAL AND ROUTED IN EPIC TO DR. TANNENBAUM FOR HIS REVIEW.

## 2013-05-16 NOTE — Pre-Procedure Instructions (Signed)
EKG AND CXR REPORTS IN EPIC FROM 04-17-13. MEDICAL CLEARANCE ON PT'S CHART FROM DR. HOFFMAN. PAM AT DR. TANNENBAUM'S OFFICE NOTIFIED PT'S DAUGHTER WITH HER DAD FOR PREADMISSION VISIT - STATES NO ONE INSTRUCTED THEM ABOUT STOPPING PLAVIX FOR SURGERY AND PT DID TAKE PLAVIX TODAY FRI 05/16/13.  PAM NOTIFIED DR. TANNENBAUM - HE SAID OK TO GO AHEAD WITH SURGERY - BUT INSTRUCT PT NOT TO TAKE ANY MORE PLAVIX.  PT'S DAUGHTER [name] VOICED UNDERSTANDING THAT HER DAD IS TO STOP PLAVIX- HE LIVES WITH HER. HE HAS HX OF STROKE BUT IS ALERT, VOICED UNDERSTANDING OF PLANNED SURGERY ON PROSTATE AND SIGNED OR CONSENT.  HIS DAUGHTER [name] ALSO SIGNED THE CONSENT.

## 2013-05-19 ENCOUNTER — Encounter (HOSPITAL_COMMUNITY): Payer: Medicare Other | Admitting: Anesthesiology

## 2013-05-19 ENCOUNTER — Encounter (HOSPITAL_COMMUNITY)
Admission: RE | Disposition: A | Discharge status: Home / Self Care | Payer: Self-pay | Source: Ambulatory Visit | Attending: Urology

## 2013-05-19 ENCOUNTER — Ambulatory Visit (HOSPITAL_COMMUNITY): Payer: Medicare Other | Admitting: Anesthesiology

## 2013-05-19 ENCOUNTER — Observation Stay (HOSPITAL_COMMUNITY)
Admission: RE | Admit: 2013-05-19 | Discharge: 2013-05-21 | Disposition: A | Discharge status: Home / Self Care | Payer: Medicare Other | Source: Ambulatory Visit | Attending: Urology | Admitting: Urology

## 2013-05-19 ENCOUNTER — Encounter (HOSPITAL_COMMUNITY): Payer: Self-pay | Admitting: *Deleted

## 2013-05-19 DIAGNOSIS — R310 Gross hematuria: Secondary | ICD-10-CM | POA: Insufficient documentation

## 2013-05-19 DIAGNOSIS — H9190 Unspecified hearing loss, unspecified ear: Secondary | ICD-10-CM | POA: Insufficient documentation

## 2013-05-19 DIAGNOSIS — N138 Other obstructive and reflux uropathy: Principal | ICD-10-CM | POA: Insufficient documentation

## 2013-05-19 DIAGNOSIS — N139 Obstructive and reflux uropathy, unspecified: Secondary | ICD-10-CM | POA: Insufficient documentation

## 2013-05-19 DIAGNOSIS — R972 Elevated prostate specific antigen [PSA]: Secondary | ICD-10-CM | POA: Insufficient documentation

## 2013-05-19 DIAGNOSIS — N400 Enlarged prostate without lower urinary tract symptoms: Secondary | ICD-10-CM | POA: Diagnosis present

## 2013-05-19 DIAGNOSIS — E7800 Pure hypercholesterolemia, unspecified: Secondary | ICD-10-CM | POA: Insufficient documentation

## 2013-05-19 DIAGNOSIS — N401 Enlarged prostate with lower urinary tract symptoms: Secondary | ICD-10-CM | POA: Insufficient documentation

## 2013-05-19 DIAGNOSIS — Z79899 Other long term (current) drug therapy: Secondary | ICD-10-CM | POA: Insufficient documentation

## 2013-05-19 DIAGNOSIS — I10 Essential (primary) hypertension: Secondary | ICD-10-CM | POA: Insufficient documentation

## 2013-05-19 DIAGNOSIS — Z87891 Personal history of nicotine dependence: Secondary | ICD-10-CM | POA: Insufficient documentation

## 2013-05-19 DIAGNOSIS — N420 Calculus of prostate: Secondary | ICD-10-CM | POA: Insufficient documentation

## 2013-05-19 HISTORY — PX: TRANSURETHRAL RESECTION OF PROSTATE: SHX73

## 2013-05-19 SURGERY — TRANSURETHRAL RESECTION OF THE PROSTATE WITH GYRUS INSTRUMENTS
Anesthesia: General

## 2013-05-19 MED ORDER — ONDANSETRON HCL 4 MG/2ML IJ SOLN
INTRAMUSCULAR | Status: DC | PRN
  Administered 2013-05-19: 4 mg via INTRAVENOUS

## 2013-05-19 MED ORDER — BELLADONNA ALKALOIDS-OPIUM 16.2-60 MG RE SUPP
RECTAL | Status: AC
  Filled 2013-05-19: qty 1

## 2013-05-19 MED ORDER — HYOSCYAMINE SULFATE 0.125 MG SL SUBL
0.1250 mg | SUBLINGUAL_TABLET | Freq: Four times a day (QID) | SUBLINGUAL | Status: DC | PRN
  Filled 2013-05-19: qty 1

## 2013-05-19 MED ORDER — DEXAMETHASONE SODIUM PHOSPHATE 10 MG/ML IJ SOLN
INTRAMUSCULAR | Status: AC
  Filled 2013-05-19: qty 1

## 2013-05-19 MED ORDER — ACETAMINOPHEN 325 MG PO TABS: 650.0000 mg | ORAL_TABLET | ORAL | Status: DC | PRN

## 2013-05-19 MED ORDER — FENTANYL CITRATE 0.05 MG/ML IJ SOLN
INTRAMUSCULAR | Status: DC | PRN
  Administered 2013-05-19 (×4): 25 ug via INTRAVENOUS

## 2013-05-19 MED ORDER — FENTANYL CITRATE 0.05 MG/ML IJ SOLN
INTRAMUSCULAR | Status: AC
  Filled 2013-05-19: qty 2

## 2013-05-19 MED ORDER — METOPROLOL TARTRATE 25 MG PO TABS
25.0000 mg | ORAL_TABLET | Freq: Two times a day (BID) | ORAL | Status: DC
  Administered 2013-05-19 – 2013-05-21 (×4): 25 mg via ORAL
  Filled 2013-05-19 (×5): qty 1

## 2013-05-19 MED ORDER — LACTATED RINGERS IV SOLN
INTRAVENOUS | Status: DC
  Administered 2013-05-19: 1000 mL via INTRAVENOUS

## 2013-05-19 MED ORDER — DEXAMETHASONE SODIUM PHOSPHATE 10 MG/ML IJ SOLN
INTRAMUSCULAR | Status: DC | PRN
  Administered 2013-05-19: 10 mg via INTRAVENOUS

## 2013-05-19 MED ORDER — EPHEDRINE SULFATE 50 MG/ML IJ SOLN
INTRAMUSCULAR | Status: DC | PRN
  Administered 2013-05-19: 10 mg via INTRAVENOUS
  Administered 2013-05-19: 5 mg via INTRAVENOUS

## 2013-05-19 MED ORDER — PROPOFOL 10 MG/ML IV BOLUS
INTRAVENOUS | Status: DC | PRN
  Administered 2013-05-19: 50 mg via INTRAVENOUS
  Administered 2013-05-19: 100 mg via INTRAVENOUS
  Administered 2013-05-19: 50 mg via INTRAVENOUS

## 2013-05-19 MED ORDER — BACITRACIN-NEOMYCIN-POLYMYXIN 400-5-5000 EX OINT: 1.0000 "application " | TOPICAL_OINTMENT | Freq: Three times a day (TID) | CUTANEOUS | Status: DC | PRN

## 2013-05-19 MED ORDER — SODIUM CHLORIDE 0.9 % IR SOLN
Status: DC | PRN
  Administered 2013-05-19: 6000 mL via INTRAVESICAL

## 2013-05-19 MED ORDER — ONDANSETRON HCL 4 MG/2ML IJ SOLN
INTRAMUSCULAR | Status: AC
  Filled 2013-05-19: qty 2

## 2013-05-19 MED ORDER — BELLADONNA ALKALOIDS-OPIUM 16.2-60 MG RE SUPP
RECTAL | Status: DC | PRN
  Administered 2013-05-19: 1 via RECTAL

## 2013-05-19 MED ORDER — CEFAZOLIN SODIUM-DEXTROSE 2-3 GM-% IV SOLR
INTRAVENOUS | Status: AC
  Filled 2013-05-19: qty 50

## 2013-05-19 MED ORDER — SODIUM CHLORIDE 0.45 % IV SOLN
INTRAVENOUS | Status: DC
  Administered 2013-05-19 – 2013-05-20 (×2): via INTRAVENOUS

## 2013-05-19 MED ORDER — PROPOFOL 10 MG/ML IV BOLUS
INTRAVENOUS | Status: AC
  Filled 2013-05-19: qty 20

## 2013-05-19 MED ORDER — CEFAZOLIN SODIUM-DEXTROSE 2-3 GM-% IV SOLR
2.0000 g | INTRAVENOUS | Status: AC
  Administered 2013-05-19: 2 g via INTRAVENOUS

## 2013-05-19 MED ORDER — ATORVASTATIN CALCIUM 20 MG PO TABS
20.0000 mg | ORAL_TABLET | Freq: Every day | ORAL | Status: DC
  Administered 2013-05-19 – 2013-05-20 (×2): 20 mg via ORAL
  Filled 2013-05-19 (×3): qty 1

## 2013-05-19 MED ORDER — BENAZEPRIL HCL 20 MG PO TABS
20.0000 mg | ORAL_TABLET | Freq: Every morning | ORAL | Status: DC
  Administered 2013-05-20 – 2013-05-21 (×2): 20 mg via ORAL
  Filled 2013-05-19 (×2): qty 1

## 2013-05-19 MED ORDER — ONDANSETRON HCL 4 MG/2ML IJ SOLN: 4.0000 mg | INTRAMUSCULAR | Status: DC | PRN

## 2013-05-19 MED ORDER — OXYCODONE-ACETAMINOPHEN 5-325 MG PO TABS: 1.0000 | ORAL_TABLET | ORAL | Status: DC | PRN

## 2013-05-19 MED ORDER — FENTANYL CITRATE 0.05 MG/ML IJ SOLN: 25.0000 ug | INTRAMUSCULAR | Status: DC | PRN

## 2013-05-19 MED ORDER — HYDROMORPHONE HCL PF 1 MG/ML IJ SOLN
0.5000 mg | INTRAMUSCULAR | Status: DC | PRN
Start: 2013-05-19 — End: 2013-05-21

## 2013-05-19 MED ORDER — CIPROFLOXACIN HCL 250 MG PO TABS
250.0000 mg | ORAL_TABLET | Freq: Two times a day (BID) | ORAL | Status: DC
  Administered 2013-05-19 – 2013-05-21 (×4): 250 mg via ORAL
  Filled 2013-05-19 (×6): qty 1

## 2013-05-19 MED ORDER — SODIUM CHLORIDE 0.9 % IR SOLN
3000.0000 mL | Status: AC
  Administered 2013-05-19 (×3): 3000 mL

## 2013-05-19 SURGICAL SUPPLY — 21 items
BAG URINE DRAINAGE (UROLOGICAL SUPPLIES) ×1 IMPLANT
BAG URO CATCHER STRL LF (DRAPE) ×2 IMPLANT
BLADE SURG 15 STRL LF DISP TIS (BLADE) IMPLANT
BLADE SURG 15 STRL SS (BLADE)
CATH HEMA 3WAY 30CC 22FR COUDE (CATHETERS) ×1 IMPLANT
DRAPE CAMERA CLOSED 9X96 (DRAPES) ×2 IMPLANT
ELECT BUTTON HF 24-28F 2 30DE (ELECTRODE) IMPLANT
ELECT HF RESECT BIPO 24F 45 ND (CUTTING LOOP) IMPLANT
ELECT LOOP MED HF 24F 12D CBL (CLIP) ×1 IMPLANT
ELECT RESECT VAPORIZE 12D CBL (ELECTRODE) IMPLANT
GLOVE BIOGEL M STRL SZ7.5 (GLOVE) ×4 IMPLANT
GOWN STRL REUS W/TWL XL LVL3 (GOWN DISPOSABLE) ×3 IMPLANT
HOLDER FOLEY CATH W/STRAP (MISCELLANEOUS) ×1 IMPLANT
IV NS IRRIG 3000ML ARTHROMATIC (IV SOLUTION) ×1 IMPLANT
KIT ASPIRATION TUBING (SET/KITS/TRAYS/PACK) ×2 IMPLANT
MANIFOLD NEPTUNE II (INSTRUMENTS) ×2 IMPLANT
NS IRRIG 1000ML POUR BTL (IV SOLUTION) ×2 IMPLANT
PACK CYSTO (CUSTOM PROCEDURE TRAY) ×2 IMPLANT
SYR 30ML LL (SYRINGE) ×1 IMPLANT
SYRINGE IRR TOOMEY STRL 70CC (SYRINGE) ×2 IMPLANT
TUBING CONNECTING 10 (TUBING) ×2 IMPLANT

## 2013-05-19 NOTE — Op Note (Signed)
Pre-operative diagnosis :   BPH  Postoperative diagnosis:  Same  Operation:  Cystourethroscopy, TURP  Surgeon:  S. Gaynelle Arabian, MD  First assistant:  None  Anesthesia:  General LMA  Preparation:  After appropriate preanesthesia, the patient was brought to the operating room, placed on the operating table in the dorsal supine position. The pubis and penis were prepped with Betadine solution and draped in usual fashion. History was reviewed. Armband was double checked..:  Review history:  Active Problems  Problems  1. Benign prostatic hyperplasia with urinary obstruction (600.01,599.69)   Assessed By: Carolan Clines (Urology); Last Assessed: 18 Mar 2013  2. Elevated prostate specific antigen (PSA) (790.93)   Assessed By: Carolan Clines (Urology); Last Assessed: 21 Jun 2012  3. Gross hematuria (599.71)  History of Present Illness  Mr. Mcclenathan is an 80 year old male (very hard of hearing) returns today for an 8 mo f/u, cystoscopy & to review CT results. Hx of BPH. His PSA was 4.7 with a PSA2 of 19.9% in 2008. Ultrasound showed a 37.93 cc prostate. Biopsy showed BPH only. He has some urgency. Repeat PSA 2.12 in 2009, with Vit D=46. IPSS = 5/7 in 2013, and 4/7 on 2014. He has some urge to void. He has had some gross hematuria. No tobacco exposure. He had pain with first hematuria void,but not anymore. ( hematuria since Sunday-now resolved.  06/21/12 PSA - 5.08/25%  03/03/09 PSA - 2.23 and Vit D - 37.0  Past Medical History  Problems  1. Elevated prostate specific antigen (PSA) (790.93)  2. History of hypercholesterolemia (V12.29)  3. History of hypertension (V12.59)  Surgical History  Problems  1. History of Ear Surgery  Current Meds  1. Fish Oil CAPS;  Therapy: (Recorded:31Jul2008) to Recorded  2. Lotrel 10-20 MG Oral Capsule;  Therapy: (Recorded:31Jul2008) to Recorded  3. Multi-Vitamin TABS;  Therapy: (Recorded:31Jul2008) to Recorded  4. Sulfamethoxazole-Trimethoprim  400-80 MG Oral Tablet; Take 1 tablet every 12 hours;  Therapy: 11Apr2014 to (Evaluate:09May2014) Requested for: 11Apr2014; Last  Rx:11Apr2014 Ordered  Allergies  Medication  1. No Known Drug Allergies    Statement of  Likelihood of Success: Excellent. TIME-OUT observed.:  Procedure:  Cystourethroscopy was accomplished and showed normal appearing distal, mid, and proximal urethra. The Vero was identified. The prostate was noted to have bilobar BPH, with a fused elevated midline. No intravesicular median lobe was identified, however. The trigone was in normal position, and clear reflux is seen from both cortices, located normally on the trigone. Trabeculation was noted within the bladder. There was no evidence of cellules, bladder stone, or bladder tumor.  Prostate resection was begun at the 7:00 position and carried to the 5:00 position, and from the 11:00 to the 7:00 position, and from the 1:00 to the 5:00 position. Multiple prostatic stones were identified. Chips were evacuated free from the bladder. Extensive cauterization was accomplished because the patient had previously been on Plavix. Following evacuation of chips from the bladder, a size 22 three-way hematuria catheter was passed in the bladder, and placed to continuous irrigation and traction. The patient was awakened and taken to recovery room in good condition. He had a B. and O. suppository placed. He tolerated procedure well, was awakened in good condition.

## 2013-05-19 NOTE — H&P (Signed)
f/u, cystoscopy & review CT results   Active Problems Problems  1. Benign prostatic hyperplasia with urinary obstruction (600.01,599.69)   Assessed By: Carolan Clines (Urology); Last Assessed: 18 Mar 2013 2. Elevated prostate specific antigen (PSA) (790.93)   Assessed By: Carolan Clines (Urology); Last Assessed: 21 Jun 2012 3. Gross hematuria (599.71)  History of Present Illness    Mr. Reggio is an 80 year old male (very hard of hearing) returns today for an 8 mo f/u, cystoscopy & to review CT results. Hx of BPH. His PSA was 4.7 with a PSA2 of 19.9% in 2008. Ultrasound showed a 37.93 cc prostate. Biopsy showed BPH only. He has some urgency. Repeat PSA 2.12 in 2009, with Vit D=46.  IPSS = 5/7 in 2013, and 4/7 on 2014. He has some urge to void. He has had some gross hematuria. No tobacco exposure. He had pain with first hematuria void,but not anymore. ( hematuria since _0 [place].                      [place], [place] [address]  Est GFR, NonAfrican American 54 mL/min L   THE ESTIMATED GFR IS A CALCULATION VALID FOR ADULTS (>=52 YEARS OLD) THAT USES THE CKD-EPI ALGORITHM TO ADJUST FOR AGE  AND SEX. IT IS   NOT TO BE USED FOR CHILDREN, PREGNANT WOMEN, HOSPITALIZED PATIENTS,    PATIENTS ON DIALYSIS, OR WITH RAPIDLY CHANGING KIDNEY FUNCTION. ACCORDING TO THE NKDEP, EGFR >89 IS NORMAL, 60-89 SHOWS MILD IMPAIRMENT, 30-59 SHOWS MODERATE IMPAIRMENT, 15-29 SHOWS SEVERE IMPAIRMENT AND <15 IS ESRD.   AU CT-HEMATURIA PROTOCOL [id number] 12:00AM Carolan Clines   Test Name Result Flag Reference  AU CT-HEMATURIA PROTOCOL (Report)    ** RADIOLOGY REPORT BY [facility] **    CLINICAL DATA: Hematuria  EXAM: CT ABDOMEN AND PELVIS WITHOUT AND WITH CONTRAST  TECHNIQUE: Multidetector CT imaging of the abdomen and pelvis was performed without contrast material in one or both body regions, followed by contrast material(s) and further sections in one or both body regions.  CONTRAST: 125 mL Isovue 300  COMPARISON: None.  FINDINGS: There is a 2 mm nonobstructing stone in the posterior midpole left kidney. There is no hydronephrosis bilaterally. There are multiple simple cysts identified in both kidneys. The largest is in the lateral upper to mid pole measuring 3.1 x 3.3 cm.  There are multiple liver cysts, the largest measuring 2.5 x 2.7 cm. The spleen, pancreas are normal. There are gallstones within the gallbladder. In the left adrenal gland, there is a 1.7 x 2.3 cm mass with pre contrast Hounsfield units of 2.71 consistent with adrenal adenoma. In the right adrenal gland, there is a 0.9 cm nodule with precontrast Hounsfield unit of 35, not typical for adrenal adenoma. The pancreas is normal. There is no abdominal aortic aneurysm. There is no abdominal lymphadenopathy. There is no small bowel obstruction. Bowel content is noted throughout colon.  Contrast filled bladder demonstrate debris in the posterior bladder. Minimal dependent atelectasis is identified in the visualized lung bases. Degenerative joint changes of the spine are noted.  IMPRESSION: 2 mm nonobstructing stone in the left kidney. No hydronephrosis bilaterally. Simple cysts in bilateral kidneys. Debris in the posterior bladder.   Electronically Signed  By: Abelardo Diesel M.D.  On: 02/21/2013 14:11   Procedure  Procedure: Cystoscopy  Chaperone Present: kim lewis.  Indication: Hematuria. Lower Urinary Tract Symptoms.  Informed Consent: Risks, benefits, and potential adverse events were discussed and informed consent was obtained from the patient.  Prep: The patient was prepped with  betadine.  Anesthesia:. Local anesthesia was administered intraurethrally with 2% lidocaine jelly.  Antibiotic prophylaxis: Ciprofloxacin.  Procedure Note:  Urethral meatus:. No abnormalities.  Prostatic urethra:. There was visual obstruction of the prostatic urethra. The lateral prostatic lobes were enlarged. No intravesical median lobe was visualized.  Bladder: Large amount of mucous and bladder stone matrix. Visulization was obscured due to cloudy urine. A systematic survey of the bladder demonstrated no bladder tumors or stones. Examination of the bladder demonstrated trabeculation, but no clot within the bladder no erythematous mucosa, no ulcer, no edema and no cellules. The patient tolerated the procedure well.  Complications: None.    Assessment Assessed  1. Renal stone (592.0) 2. Benign prostatic hyperplasia with urinary obstruction (600.01,599.69) 3. Bladder stone (594.1)  Bladder obstruction with early stone formation. I have discussed the situation with the pt and his daughter-and shown her the TV monitor findings of stone formation within the bladder. I think TURP would help him empty, but am concerned with his age, and the possibility of medical issues post anesthesia. Both the patient and his daughter do not want to have him have a catheter, or develop a stone, or further infections, and would like to proceed  with TURP to try to keep him catheter free as long as possible ( he is 80 yrs old).   In addition, he has a 46m stone in the RUP.   Plan Arrange TURP. Will plan for 23 hr observation and home with catheter. He needs medical clearance preo-op.   Discussion/Summary cc: Dr. HSheryle Hail [email or web address] [email or web address]    Signatures Electronically signed by : SCarolan Clines M.D.; Mar 18 2013  5:39PM EST

## 2013-05-19 NOTE — Anesthesia Postprocedure Evaluation (Signed)
  Anesthesia Post-op Note  Patient: [name]  Procedure(s) Performed: Procedure(s) (LRB): TRANSURETHRAL RESECTION OF THE PROSTATE WITH GYRUS INSTRUMENTS (N/A)  Patient Location: PACU  Anesthesia Type: General  Level of Consciousness: awake and alert   Airway and Oxygen Therapy: Patient Spontanous Breathing  Post-op Pain: mild  Post-op Assessment: Post-op Vital signs reviewed, Patient's Cardiovascular Status Stable, Respiratory Function Stable, Patent Airway and No signs of Nausea or vomiting  Last Vitals:  Filed Vitals:   05/19/13 1557  BP: 159/89  Pulse: 69  Temp: 36.3 C  Resp: 16    Post-op Vital Signs: stable   Complications: No apparent anesthesia complications

## 2013-05-19 NOTE — Anesthesia Preprocedure Evaluation (Addendum)
Anesthesia Evaluation  Patient identified by MRN, date of birth, ID band Patient awake    Reviewed: Allergy & Precautions, H&P , NPO status , Patient's Chart, lab work & pertinent test results  Airway Mallampati: II TM Distance: >3 FB Neck ROM: Full    Dental  (+) Edentulous Upper   Pulmonary former smoker,  breath sounds clear to auscultation  Pulmonary exam normal       Cardiovascular Exercise Tolerance: Good hypertension, Pt. on medications and Pt. on home beta blockers Rhythm:Regular Rate:Normal     Neuro/Psych CVA, Residual Symptoms negative psych ROS   GI/Hepatic negative GI ROS, Neg liver ROS,   Endo/Other  negative endocrine ROS  Renal/GU Renal disease  negative genitourinary   Musculoskeletal negative musculoskeletal ROS (+)   Abdominal   Peds negative pediatric ROS (+)  Hematology  (+) anemia ,   Anesthesia Other Findings   Reproductive/Obstetrics negative OB ROS                          Anesthesia Physical Anesthesia Plan  ASA: III  Anesthesia Plan: General   Post-op Pain Management:    Induction: Intravenous  Airway Management Planned: LMA  Additional Equipment:   Intra-op Plan:   Post-operative Plan: Extubation in OR  Informed Consent: I have reviewed the patients History and Physical, chart, labs and discussed the procedure including the risks, benefits and alternatives for the proposed anesthesia with the patient or authorized representative who has indicated his/her understanding and acceptance.   Dental advisory given  Plan Discussed with: CRNA  Anesthesia Plan Comments: (Discussed r/b general with patient and patient's daughter. Questions answered.)       Anesthesia Quick Evaluation

## 2013-05-19 NOTE — Preoperative (Signed)
Beta Blockers   Reason not to administer Beta Blockers:Not Applicable 

## 2013-05-19 NOTE — Transfer of Care (Signed)
Immediate Anesthesia Transfer of Care Note  Patient: [name]  Procedure(s) Performed: Procedure(s): TRANSURETHRAL RESECTION OF THE PROSTATE WITH GYRUS INSTRUMENTS (N/A)  Patient Location: PACU  Anesthesia Type:General  Level of Consciousness: awake and patient cooperative  Airway & Oxygen Therapy: Patient Spontanous Breathing and Patient connected to face mask oxygen  Post-op Assessment: Report given to PACU RN and Post -op Vital signs reviewed and stable  Post vital signs: Reviewed and stable  Complications: No apparent anesthesia complications

## 2013-05-19 NOTE — Interval H&P Note (Signed)
History and Physical Interval Note:  05/19/2013 9:32 AM  [name]  has presented today for surgery, with the diagnosis of Benign Prostatic Hyperplasia  The various methods of treatment have been discussed with the patient and family. After consideration of risks, benefits and other options for treatment, the patient has consented to  Procedure(s): TRANSURETHRAL RESECTION OF THE PROSTATE WITH GYRUS INSTRUMENTS (N/A) as a surgical intervention .  The patient's history has been reviewed, patient examined, no change in status, stable for surgery.  I have reviewed the patient's chart and labs.  Questions were answered to the patient's satisfaction.     Carolan Clines I

## 2013-05-20 ENCOUNTER — Encounter (HOSPITAL_COMMUNITY): Payer: Self-pay | Admitting: Urology

## 2013-05-20 NOTE — Plan of Care (Signed)
Problem: Phase II Progression Outcomes Goal: OOB/Ambulate TID Outcome: Progressing Patient unable to ambulate, not able to move the right side good but got him up to the chair with 2 people assist.

## 2013-05-20 NOTE — Progress Notes (Signed)
Upon assessment, noticed pt was leaking around catheter and out the irrigation port. Occluded irrigation port and hand irrigated catheter. Urine is red but no clots were noted, irrigated well. Pt complains of no pain. Resting comfortably at this time. Will continue to monitor closely. Blanchard Kelch, RN

## 2013-05-20 NOTE — Progress Notes (Signed)
Urology Progress Note  1 Day Post-Op : urine concentrated, but no clots.   Subjective:     No acute urologic events overnight. Ambulation:   negative Flatus:    positive Bowel movement  negative  Pain: complete resolution  Objective:  Blood pressure 110/63, pulse 58, temperature 98.1 F (36.7 C), temperature source Oral, resp. rate 16, height 5\' 7"  (1.702 m), weight 75.51 kg (166 lb 7.5 oz), SpO2 100.00%.  Physical Exam:  General:  No acute distress, awake Extremities: extremities normal, atraumatic, no cyanosis or edema Genitourinary:  normal BUUS Foley:  remains    I/O last 3 completed shifts: In: 10238.3 [P.O.:480; I.V.:958.3; Other:8800] Out: 10075 [ZOXWR:60454]  No results found for this basename: HGB, WBC, PLT,  in the last 72 hours  No results found for this basename: NA, K, CL, CO2, BUN, CREATININE, CALCIUM, MAGNESIUM, GFRNONAA, GFRAA,  in the last 72 hours   No results found for this basename: PT, INR, APTT,  in the last 72 hours   No components found with this basename: ABG,   Assessment/Plan:  Continue any current medications. Pt is to increase activities as tolerated. D/c CBI. Plan for cath removal in AM.

## 2013-05-21 MED ORDER — SULFAMETHOXAZOLE-TMP DS 800-160 MG PO TABS: 1.0000 | ORAL_TABLET | Freq: Two times a day (BID) | ORAL | Status: DC

## 2013-05-21 MED ORDER — TRAMADOL-ACETAMINOPHEN 37.5-325 MG PO TABS: 1.0000 | ORAL_TABLET | Freq: Four times a day (QID) | ORAL | Status: DC | PRN

## 2013-05-21 NOTE — Progress Notes (Signed)
Urology Progress Note  2 Days Post-Op : TURP.  Foley out. Spontaneous void. No bleeding.  Pathology: BPH  Subjective:     No acute urologic events overnight. Ambulation:   positive Flatus:    positive Bowel movement  positive  Pain: complete resolution  Objective:  Blood pressure 124/69, pulse 68, temperature 98.4 F (36.9 C), temperature source Oral, resp. rate 16, height 5\' 7"  (1.702 m), weight 75.51 kg (166 lb 7.5 oz), SpO2 92.00%.  Physical Exam:  General:  No acute distress, awake Extremities: extremities normal, atraumatic, no cyanosis or edema Genitourinary:  normal Foley:  out    I/O last 3 completed shifts: In: 10336.7 [P.O.:840; I.V.:1946.7; Other:7550] Out: 3154 [Urine:8150]  No results found for this basename: HGB, WBC, PLT,  in the last 72 hours  No results found for this basename: NA, K, CL, CO2, BUN, CREATININE, CALCIUM, MAGNESIUM, GFRNONAA, GFRAA,  in the last 72 hours   No results found for this basename: PT, INR, APTT,  in the last 72 hours   No components found with this basename: ABG,   Assessment/Plan:  Continue any current medications.D/c today. May restart Plavix if necessary, but anticipate possible hematuria.

## 2013-05-21 NOTE — Progress Notes (Signed)
Removed catheter per MD order. Patient tolerated well. Verbalized understanding to call when pt needs to void in the future. Blanchard Kelch, RN

## 2013-05-21 NOTE — Discharge Summary (Signed)
  Physician Discharge Summary  Patient ID: [name] MRN: [id number] DOB/AGE: April 20, 1924 80 y.o.  Admit date: 05/19/2013 Discharge date: 05/21/2013  Admission Diagnoses: Benign Prostatic Hyperplasia  Discharge Diagnoses:  Active Problems:   Benign prostatic hypertrophy   Discharged Condition: good  Hospital Course:   TURP  Consults: None  Significant Diagnostic Studies: No results found.  Treatments: IV hydration  Discharge Exam: Blood pressure 124/69, pulse 68, temperature 98.4 F (36.9 C), temperature source Oral, resp. rate 16, height 5\' 7"  (1.702 m), weight 75.51 kg (166 lb 7.5 oz), SpO2 92.00%. General appearance: alert and cooperative  Disposition: 01-Home or Self Care  Discharge Orders   Future Orders Complete By Expires   Discharge patient  As directed    Discontinue IV  As directed        Medication List         atorvastatin 20 MG tablet  Commonly known as:  LIPITOR  Take 20 mg by mouth daily at 6 PM.     benazepril 20 MG tablet  Commonly known as:  LOTENSIN  Take 20 mg by mouth every morning.     clopidogrel 75 MG tablet  Commonly known as:  PLAVIX  Take 75 mg by mouth daily with breakfast.     metoprolol tartrate 25 MG tablet  Commonly known as:  LOPRESSOR  Take 25 mg by mouth 2 (two) times daily.     sulfamethoxazole-trimethoprim 800-160 MG per tablet  Commonly known as:  BACTRIM DS  Take 1 tablet by mouth 2 (two) times daily.     traMADol-acetaminophen 37.5-325 MG per tablet  Commonly known as:  ULTRACET  Take 1 tablet by mouth every 6 (six) hours as needed.           Follow-up Information   Follow up with Ailene Rud, MD.   Specialty:  Urology   Contact information:   [facility]  [place] [address] [phone]       Signed: Carolan Clines I 05/21/2013, 8:29 AM

## 2013-05-21 NOTE — Progress Notes (Signed)
Discharged to home, seen by MD  No need to collect string of bottles, patien voided 2x prrior to d/c home. daughter Apolonio Schneiders at bedside. D/c orders and follow up appointment done and discussed with daughter. PIV removed no s/s of infiltration or swelling noted.

## 2013-05-21 NOTE — Discharge Instructions (Signed)
Transurethral Resection of the Prostate °Transurethral resection of the prostate (TURP) is the removal of part of your prostate to treat noncancerous (benign) prostatic hyperplasia (BPH). BPH typically occurs in men older than 40 years. It is the abnormal growth of cells in your prostate. Specifically, it is an abnormal increase in the number of cells that make up your prostate tissue. This causes an increase in the size of your prostate. Often, in the case of BPH, the prostate becomes so large that it compresses the tube that drains urine out of your body from your bladder (urethra). Eventually, this compression can obstruct the flow of urine from your bladder. This obstruction can cause recurrent bladder infection and difficulties with bladder control and bladder emptying. The goal of TURP is to remove enough prostate tissue to allow for an unobstructed flow of urine, which often resolves the associated conditions. °LET YOUR CAREGIVER KNOW ABOUT: °· Any allergies you have. °· Any medicines you are taking, including herbs, eye drops, over-the-counter medicines, and creams. °· Any problems you have had with the use of anesthetics. °· Any blood disorders you have, including bleeding problems and clotting problems. °· Previous surgeries you have had. °· Any prostate infections you have had. °RISKS AND COMPLICATIONS °Generally, TURP is a safe procedure. However, as with any surgical procedure, complications can occur. Possible complications associated with TURP include: °· Difficulty getting an erection. °· Scarring, which may cause problems with the flow of your urine. °· Injury to your urethra. °· Incontinence from injury to the muscle that surrounds your prostate, which controls urine flow. °· Infection. °· Bleeding. °· Injury to your bladder (rare). °BEFORE THE PROCEDURE  °Your caregiver will tell you when you need to stop eating and drinking. If you take any medicines, your caregiver will tell you which ones you  may keep taking and which ones you will have to stop taking and when.  °Just before the procedure you will also receive medicine to make you fall asleep (general anesthetic). This will be given through a tube that is inserted into one of your veins (intravenous [IV] tube). °PROCEDURE °Your surgeon inserts an instrument that is similar to a telescope with an electric cutting edge (resectoscope) through your urethra to the area of the prostate gland. The cutting edge is used to remove enlarged pieces of your prostate, one piece at a time. At the end of your procedure, a flexible tube (catheter) will be inserted into your urethra to drain your bladder. Special plastic bags filled with solution will be connected to the end of the catheter. The solution will be used to irrigate blood from your bladder while you heal.  °AFTER THE PROCEDURE °You will be taken to the recovery area. Once you are awake, stable, and taking fluids well, you will be taken to your hospital room. Typically, you will stay in the hospital 1 2 days after this procedure. The catheter usually is removed before discharge from the hospital. °Document Released: 02/27/2005 Document Revised: 11/22/2011 Document Reviewed: 07/31/2011 °ExitCare® Patient Information ©2014 [employer]. ° °

## 2013-05-30 ENCOUNTER — Ambulatory Visit: Payer: Medicare Other | Admitting: Internal Medicine

## 2013-05-30 ENCOUNTER — Encounter: Payer: Self-pay | Admitting: Internal Medicine

## 2013-05-30 ENCOUNTER — Ambulatory Visit (INDEPENDENT_AMBULATORY_CARE_PROVIDER_SITE_OTHER): Payer: Medicare Other | Admitting: Internal Medicine

## 2013-05-30 VITALS — BP 143/79 | HR 76 | Temp 98.0°F | Ht 72.0 in | Wt 169.7 lb

## 2013-05-30 DIAGNOSIS — N400 Enlarged prostate without lower urinary tract symptoms: Secondary | ICD-10-CM

## 2013-05-30 DIAGNOSIS — R5381 Other malaise: Secondary | ICD-10-CM | POA: Insufficient documentation

## 2013-05-30 DIAGNOSIS — R296 Repeated falls: Secondary | ICD-10-CM

## 2013-05-30 DIAGNOSIS — Z9181 History of falling: Secondary | ICD-10-CM

## 2013-05-30 DIAGNOSIS — I10 Essential (primary) hypertension: Secondary | ICD-10-CM

## 2013-05-30 NOTE — Progress Notes (Signed)
[facility] Subjective:   Patient ID: [name] male   DOB: Aug 09, 1924 80 y.o.   MRN: [id number]  HPI: Jimmy.[name] is a 80 y.o. male with a PMH significant for HTN, Cerebellar CVA, Mild anemia and thrombocytopenia, and BPH.   He recently underwent TURP on 05/19/13 with Dr. Gaynelle Arroyo. He is accompanied by his daughter today.  She reports that since his operation she has noticed that he has been in a lot of discomfort.  He has trouble sitting comfortably and prefers to stay in bed.  She has noticed a rapid decease in functional status of [name], and although he has a history of falls due to balance issues from his stroke he seems much weaker on his feet now and has probably fallen ~12 times over the past 2 weeks. They have been to follow up with Dr. Arlyn Arroyo office and his urinary symptoms are much improved after the surgery.  He is still having some mild hematuria.    Past Medical History  Diagnosis Date  . Hypertension   . Difficulty hearing, right     BILATERAL HEARING LOSS - BEST TO TRY TO SPEAK INTO LEFT EAR  . Meningioma   . Frequent falls   . BPH (benign prostatic hypertrophy)   . Chronic kidney disease     CHRONIC KIDNEY DISEASE, 2  . Anemia   . Thrombocytopenia   . Stroke     Cerebellar, 2013; WALKS WITH WALKER, ABLE TO DRESS AND BATHE HIMSELF BUT FAMILY TRIES TO PROVIDE SUPERVISION BECAUSE OF HIS HX OF FALL AND WEAKNESS LEGS, ARMS   . Incontinence of urine     SOME INCONTINENCE   Current Outpatient Prescriptions  Medication Sig Dispense Refill  . atorvastatin (LIPITOR) 20 MG tablet Take 20 mg by mouth daily at 6 PM.      . benazepril (LOTENSIN) 20 MG tablet Take 20 mg by mouth every morning.      . clopidogrel (PLAVIX) 75 MG tablet Take 75 mg by mouth daily with breakfast.      . metoprolol tartrate (LOPRESSOR) 25 MG tablet Take 25 mg by mouth 2 (two) times daily.      Marland Kitchen sulfamethoxazole-trimethoprim (BACTRIM DS) 800-160 MG per  tablet Take 1 tablet by mouth 2 (two) times daily.  20 tablet  1  . traMADol-acetaminophen (ULTRACET) 37.5-325 MG per tablet Take 1 tablet by mouth every 6 (six) hours as needed.  30 tablet  2   No current facility-administered medications for this visit.   Family History  Problem Relation Age of Onset  . Diabetes Mother   . Heart disease Mother    History   Social History  . Marital Status: Widowed    Spouse Name: N/A    Number of Children: N/A  . Years of Education: N/A   Social History Main Topics  . Smoking status: Former Smoker    Types: Cigarettes  . Smokeless tobacco: None  . Alcohol Use: No     Comment: QUIT SMOKING 50 YRS AGO  . Drug Use: No  . Sexual Activity: None   Other Topics Concern  . None   Social History Narrative   Lives with daughter. Ambulates w walker. Can make basic meals (cereal). Very hard of hearing.    Review of Systems: Review of Systems  Constitutional: Negative for fever, chills and malaise/fatigue.  HENT: Positive for hearing loss. Negative for tinnitus.   Eyes: Negative for blurred vision.  Respiratory: Negative for  shortness of breath.   Cardiovascular: Negative for chest pain.  Gastrointestinal: Negative for abdominal pain, diarrhea and constipation.  Genitourinary: Positive for hematuria. Negative for dysuria, urgency and flank pain.  Musculoskeletal: Positive for falls.  Neurological: Positive for weakness. Negative for tingling, speech change, focal weakness, loss of consciousness and headaches.    Objective:  Physical Exam: Filed Vitals:   05/30/13 1454  BP: 143/79  Pulse: 76  Temp: 98 F (36.7 C)  TempSrc: Oral  Height: 6' (1.829 m)  Weight: 169 lb 11.2 oz (76.975 kg)  SpO2: 93%  Physical Exam  Nursing note and vitals reviewed. Constitutional: He is oriented to person, place, and time.  In wheelchair  HENT:  Hard of hearing  Cardiovascular: Normal rate, regular rhythm, normal heart sounds and intact distal pulses.    Pulmonary/Chest: Effort normal and breath sounds normal.  Abdominal: Soft. Bowel sounds are normal.  Musculoskeletal: He exhibits no edema.  5/5 gross lower motor strength, 5/5 gross upper motor strength, 5/5 grip strength bilaterally  Neurological: He is alert and oriented to person, place, and time. He has normal reflexes.  Unable to assess gait, very unsteady when attempting to stand    Assessment & Plan:  Case discussed with Dr. Lynnae Arroyo See Problem Based Assessment and Plan Medications Ordered No orders of the defined types were placed in this encounter.   Other Orders Orders Placed This Encounter  Procedures  . Ambulatory referral to Home Health    Referral Priority:  Routine    Referral Type:  Home Health Care    Referral Reason:  Specialty Services Required    Requested Specialty:  [place]    Number of Visits Requested:  1  . Home Health    Order Specific Question:  To provide the following care/treatments    Answer:  PT    Order Specific Question:  To provide the following care/treatments    Answer:  RN  . Face-to-face encounter (required for Medicare/Medicaid patients)    I [name] certify that this patient is under my care and that I, or a nurse practitioner or physician's assistant working with me, had a face-to-face encounter that meets the physician face-to-face encounter requirements with this patient on 05/30/2013. 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, Frequent falls    Order Specific Question:  I certify that, based on my findings,  the following services are medically necessary home health services    Answer:  Physical therapy    Order Specific Question:  I certify that, based on my findings, the following services are medically necessary home health services    Answer:  Nursing    Order Specific Question:  Reason for Medically Necessary Home Health Services    Answer:  Skilled Nursing- Change/Decline in Patient Status    Order Specific Question:  Reason for Medically Necessary Home Health Services    Answer:  Skilled Nursing- Skilled Assessment/Observation    Order Specific Question:  Reason for Medically Necessary Home Health Services    Answer:  Therapy- Personnel officer, Public librarian  Order Specific Question:  Reason for Medically Necessary Home Health Services    Answer:  Therapy- Home Adaptation to Facilitate Safety    Order Specific Question:  Reason for Medically Necessary Home Health Services    Answer:  Therapy- Therapeutic Exercises to Increase Strength and Endurance    Order Specific Question:  My clinical findings support the need for the above services    Answer:  Pain interferes with ambulation/mobility    Order Specific Question:  Further, I certify that my clinical findings support that this patient is homebound due to:    Answer:  Unable to leave home safely without assistance

## 2013-05-30 NOTE — Patient Instructions (Signed)
[facility] will contract you about [facility] Therapy at [facility] to come to your home. Please continue to take medications as prescribed. Please call office if any further changes.  Please bring your medicines with you each time you come.   Medicines may be  Eye drops  Herbal   Vitamins  Pills  Seeing these help [place] take care of you.

## 2013-06-01 NOTE — Assessment & Plan Note (Signed)
Urinary symptoms improved after TURP. Continues to have pain in perirectal area. -Continue Ultracet

## 2013-06-01 NOTE — Assessment & Plan Note (Signed)
BP Readings from Last 3 Encounters:  05/30/13 143/79  05/21/13 124/69  05/21/13 124/69    Lab Results  Component Value Date   NA 146 05/16/2013   K 3.6* 05/16/2013   CREATININE 1.03 05/16/2013    Assessment: Blood pressure control: controlled Progress toward BP goal:  at goal Comments:   Plan: Medications:  Metoprolol 25 BID, Benazepril 20mg  Daily Educational resources provided:   Self management tools provided:   Other plans:

## 2013-06-01 NOTE — Assessment & Plan Note (Signed)
Secondary to pain after TURP.  Will refer for home health PT eval and treat.

## 2013-06-01 NOTE — Assessment & Plan Note (Signed)
Fall Screening 08/30/2012 02/03/2013 04/24/2013 05/30/2013  Falls in the past year? Yes Yes Yes Yes  Number of falls in past year 2 or more 1 2 or more 2 or more  Was there an injury with Fall? - No - -  Risk Factor Category  High Fall Risk - High Fall Risk -      Assessment: Progress toward fall prevention goal:  deteriorated Comments: deconditioning after Sx  Plan: Fall prevention plans: Physical Therapy and RN assessment at home. Educational resources provided:   Self management tools provided:

## 2013-06-02 ENCOUNTER — Telehealth: Payer: Self-pay | Admitting: Licensed Clinical Social Worker

## 2013-06-02 ENCOUNTER — Ambulatory Visit: Payer: Medicare Other | Admitting: Internal Medicine

## 2013-06-02 NOTE — Telephone Encounter (Signed)
CSW placed call to [name] to determine choice for [facility] agency.  Unable to leave message at home phone number and mobile number is daughters.  CSW spoke with pt's daugther, [name].  Confirmed [name] lives with daugther and confirmed contact numbers.  Best to use daughters work # placed on chart.  [name] states [name] has had home health in the past and it was with [facility].  Pt was satisfied with services and agreeable to use again.  CSW completed referral to [facility].

## 2013-06-05 NOTE — Progress Notes (Signed)
Case discussed with Dr. Hoffman at the time of the visit.  We reviewed the resident's history and exam and pertinent patient test results.  I agree with the assessment, diagnosis, and plan of care documented in the resident's note. 

## 2013-06-10 ENCOUNTER — Encounter (HOSPITAL_COMMUNITY): Payer: Self-pay | Admitting: Emergency Medicine

## 2013-06-10 ENCOUNTER — Inpatient Hospital Stay (HOSPITAL_COMMUNITY)
Admission: EM | Admit: 2013-06-10 | Discharge: 2013-06-20 | DRG: 253 | Disposition: A | Discharge status: Skilled Nursing Facility | Payer: Medicare Other | Attending: Internal Medicine | Admitting: Internal Medicine

## 2013-06-10 ENCOUNTER — Telehealth: Payer: Self-pay | Admitting: Licensed Clinical Social Worker

## 2013-06-10 DIAGNOSIS — Z87891 Personal history of nicotine dependence: Secondary | ICD-10-CM

## 2013-06-10 DIAGNOSIS — D62 Acute posthemorrhagic anemia: Secondary | ICD-10-CM | POA: Diagnosis not present

## 2013-06-10 DIAGNOSIS — N182 Chronic kidney disease, stage 2 (mild): Secondary | ICD-10-CM | POA: Diagnosis present

## 2013-06-10 DIAGNOSIS — R609 Edema, unspecified: Secondary | ICD-10-CM

## 2013-06-10 DIAGNOSIS — N138 Other obstructive and reflux uropathy: Secondary | ICD-10-CM | POA: Diagnosis present

## 2013-06-10 DIAGNOSIS — R310 Gross hematuria: Secondary | ICD-10-CM

## 2013-06-10 DIAGNOSIS — Z86718 Personal history of other venous thrombosis and embolism: Secondary | ICD-10-CM | POA: Diagnosis present

## 2013-06-10 DIAGNOSIS — Z79899 Other long term (current) drug therapy: Secondary | ICD-10-CM

## 2013-06-10 DIAGNOSIS — I82819 Embolism and thrombosis of superficial veins of unspecified lower extremities: Secondary | ICD-10-CM | POA: Diagnosis present

## 2013-06-10 DIAGNOSIS — R339 Retention of urine, unspecified: Secondary | ICD-10-CM | POA: Diagnosis present

## 2013-06-10 DIAGNOSIS — I129 Hypertensive chronic kidney disease with stage 1 through stage 4 chronic kidney disease, or unspecified chronic kidney disease: Secondary | ICD-10-CM | POA: Diagnosis present

## 2013-06-10 DIAGNOSIS — N401 Enlarged prostate with lower urinary tract symptoms: Secondary | ICD-10-CM | POA: Diagnosis present

## 2013-06-10 DIAGNOSIS — I82409 Acute embolism and thrombosis of unspecified deep veins of unspecified lower extremity: Secondary | ICD-10-CM

## 2013-06-10 DIAGNOSIS — Z8673 Personal history of transient ischemic attack (TIA), and cerebral infarction without residual deficits: Secondary | ICD-10-CM

## 2013-06-10 DIAGNOSIS — I824Y9 Acute embolism and thrombosis of unspecified deep veins of unspecified proximal lower extremity: Principal | ICD-10-CM | POA: Diagnosis present

## 2013-06-10 DIAGNOSIS — I10 Essential (primary) hypertension: Secondary | ICD-10-CM | POA: Diagnosis present

## 2013-06-10 DIAGNOSIS — D649 Anemia, unspecified: Secondary | ICD-10-CM | POA: Diagnosis present

## 2013-06-10 DIAGNOSIS — Z7902 Long term (current) use of antithrombotics/antiplatelets: Secondary | ICD-10-CM

## 2013-06-10 DIAGNOSIS — R32 Unspecified urinary incontinence: Secondary | ICD-10-CM | POA: Diagnosis present

## 2013-06-10 DIAGNOSIS — I824Z9 Acute embolism and thrombosis of unspecified deep veins of unspecified distal lower extremity: Secondary | ICD-10-CM | POA: Diagnosis present

## 2013-06-10 DIAGNOSIS — Z9181 History of falling: Secondary | ICD-10-CM

## 2013-06-10 DIAGNOSIS — M79609 Pain in unspecified limb: Secondary | ICD-10-CM

## 2013-06-10 DIAGNOSIS — H9190 Unspecified hearing loss, unspecified ear: Secondary | ICD-10-CM | POA: Diagnosis present

## 2013-06-10 DIAGNOSIS — E785 Hyperlipidemia, unspecified: Secondary | ICD-10-CM | POA: Diagnosis present

## 2013-06-10 HISTORY — DX: Personal history of other venous thrombosis and embolism: Z86.718

## 2013-06-10 LAB — BASIC METABOLIC PANEL
BUN: 23 mg/dL (ref 6–23)
CO2: 26 mEq/L (ref 19–32)
Calcium: 11.4 mg/dL — ABNORMAL HIGH (ref 8.4–10.5)
Chloride: 106 mEq/L (ref 96–112)
Creatinine, Ser: 1.13 mg/dL (ref 0.50–1.35)
GFR calc Af Amer: 66 mL/min — ABNORMAL LOW (ref >90)
GFR calc non Af Amer: 57 mL/min — ABNORMAL LOW (ref >90)
Glucose, Bld: 113 mg/dL — ABNORMAL HIGH (ref 70–99)
Potassium: 4.7 mEq/L (ref 3.7–5.3)
Sodium: 143 mEq/L (ref 137–147)

## 2013-06-10 LAB — CBC WITH DIFFERENTIAL/PLATELET
Basophils Absolute: 0 10*3/uL (ref 0.0–0.1)
Basophils Relative: 0 % (ref 0–1)
Eosinophils Absolute: 0 10*3/uL (ref 0.0–0.7)
Eosinophils Relative: 0 % (ref 0–5)
HCT: 35.1 % — ABNORMAL LOW (ref 39.0–52.0)
Hemoglobin: 11.6 g/dL — ABNORMAL LOW (ref 13.0–17.0)
Lymphocytes Relative: 20 % (ref 12–46)
Lymphs Abs: 1.4 10*3/uL (ref 0.7–4.0)
MCH: 30.5 pg (ref 26.0–34.0)
MCHC: 33 g/dL (ref 30.0–36.0)
MCV: 92.4 fL (ref 78.0–100.0)
Monocytes Absolute: 0.5 10*3/uL (ref 0.1–1.0)
Monocytes Relative: 7 % (ref 3–12)
Neutro Abs: 5.3 10*3/uL (ref 1.7–7.7)
Neutrophils Relative %: 73 % (ref 43–77)
Platelets: 181 10*3/uL (ref 150–400)
RBC: 3.8 MIL/uL — ABNORMAL LOW (ref 4.22–5.81)
RDW: 14.3 % (ref 11.5–15.5)
WBC: 7.2 10*3/uL (ref 4.0–10.5)

## 2013-06-10 LAB — PROTIME-INR
INR: 1.11 (ref 0.00–1.49)
Prothrombin Time: 14.1 seconds (ref 11.6–15.2)

## 2013-06-10 LAB — MAGNESIUM: Magnesium: 2 mg/dL (ref 1.5–2.5)

## 2013-06-10 LAB — APTT: aPTT: 33 seconds (ref 24–37)

## 2013-06-10 MED ORDER — CLOPIDOGREL BISULFATE 75 MG PO TABS
75.0000 mg | ORAL_TABLET | Freq: Every day | ORAL | Status: DC
  Administered 2013-06-10 – 2013-06-14 (×4): 75 mg via ORAL
  Filled 2013-06-10 (×5): qty 1

## 2013-06-10 MED ORDER — METOPROLOL TARTRATE 25 MG PO TABS
25.0000 mg | ORAL_TABLET | Freq: Two times a day (BID) | ORAL | Status: DC
  Administered 2013-06-10 – 2013-06-20 (×20): 25 mg via ORAL
  Filled 2013-06-10 (×22): qty 1

## 2013-06-10 MED ORDER — ENOXAPARIN SODIUM 80 MG/0.8ML ~~LOC~~ SOLN
80.0000 mg | Freq: Two times a day (BID) | SUBCUTANEOUS | Status: AC
  Administered 2013-06-10 – 2013-06-15 (×11): 80 mg via SUBCUTANEOUS
  Filled 2013-06-10 (×13): qty 0.8

## 2013-06-10 MED ORDER — VITAMIN D3 25 MCG (1000 UNIT) PO TABS
1000.0000 [IU] | ORAL_TABLET | Freq: Every day | ORAL | Status: DC
  Administered 2013-06-10 – 2013-06-20 (×11): 1000 [IU] via ORAL
  Filled 2013-06-10 (×11): qty 1

## 2013-06-10 MED ORDER — OMEGA-3-ACID ETHYL ESTERS 1 G PO CAPS
1.0000 g | ORAL_CAPSULE | Freq: Every day | ORAL | Status: DC
  Administered 2013-06-10 – 2013-06-20 (×11): 1 g via ORAL
  Filled 2013-06-10 (×11): qty 1

## 2013-06-10 MED ORDER — ATORVASTATIN CALCIUM 20 MG PO TABS
20.0000 mg | ORAL_TABLET | Freq: Every day | ORAL | Status: DC
  Administered 2013-06-10 – 2013-06-19 (×10): 20 mg via ORAL
  Filled 2013-06-10 (×11): qty 1

## 2013-06-10 MED ORDER — SODIUM CHLORIDE 0.9 % IJ SOLN
3.0000 mL | Freq: Two times a day (BID) | INTRAMUSCULAR | Status: DC
  Administered 2013-06-10 – 2013-06-19 (×17): 3 mL via INTRAVENOUS

## 2013-06-10 MED ORDER — BENAZEPRIL HCL 20 MG PO TABS
20.0000 mg | ORAL_TABLET | Freq: Every morning | ORAL | Status: DC
  Administered 2013-06-10 – 2013-06-20 (×10): 20 mg via ORAL
  Filled 2013-06-10 (×10): qty 1

## 2013-06-10 NOTE — Progress Notes (Signed)
ANTICOAGULATION CONSULT NOTE - Initial Consult  Pharmacy Consult for lovenox Indication: DVT  No Known Allergies  Patient Measurements:    Vital Signs: Temp: 97.9 F (36.6 C) (03/31 1409) Temp src: Oral (03/31 1409) BP: 149/93 mmHg (03/31 1500) Pulse Rate: 76 (03/31 1500)  Labs:  Recent Labs  06/10/13 1437  HGB 11.6*  HCT 35.1*  PLT 181  APTT 33  LABPROT 14.1  INR 1.11  CREATININE 1.13    The CrCl is unknown because both a height and weight (above a minimum accepted value) are required for this calculation.   Medical History: Past Medical History  Diagnosis Date  . Hypertension   . Difficulty hearing, right     BILATERAL HEARING LOSS - BEST TO TRY TO SPEAK INTO LEFT EAR  . Meningioma   . Frequent falls   . BPH (benign prostatic hypertrophy)   . Chronic kidney disease     CHRONIC KIDNEY DISEASE, 2  . Anemia   . Thrombocytopenia   . Stroke     Cerebellar, 2013; WALKS WITH WALKER, ABLE TO DRESS AND BATHE HIMSELF BUT FAMILY TRIES TO PROVIDE SUPERVISION BECAUSE OF HIS HX OF FALL AND WEAKNESS LEGS, ARMS   . Incontinence of urine     SOME INCONTINENCE    Medications:  See med history  Assessment: 36 yom presented to the ED with leg pain. Found to have a new LE DVT. To start lovenox for anticoagulation. He was not on any anticoagulants PTA. H/H is slightly low but plts are WNL.   Goal of Therapy:  Anti-Xa level 0.6-1 units/ml 4hrs after LMWH dose given Monitor platelets by anticoagulation protocol: Yes   Plan:  1. Lovenox 80mg  SQ Q12H 2. CBC Q72H while on lovenox 3. F/u initiation of oral anticoagulation  Gianah Batt, Rande Lawman 06/10/2013,6:35 PM

## 2013-06-10 NOTE — ED Provider Notes (Signed)
CSN: [id number]     Arrival date & time 06/10/13  1349 History   First MD Initiated Contact with Patient 06/10/13 1420     Chief Complaint  Patient presents with  . Leg Swelling     (Consider location/radiation/quality/duration/timing/severity/associated sxs/prior Treatment) HPI Level 5 caveat due to dementia Pt brought to the ED from home via EMS for evaluation of LLE edema. Daughter at bedside reports he has had occasional swelling of the ankle in that leg before, but worse over the last several days and now progressed to level of the knee. He recently had a TURP.  Past Medical History  Diagnosis Date  . Hypertension   . Difficulty hearing, right     BILATERAL HEARING LOSS - BEST TO TRY TO SPEAK INTO LEFT EAR  . Meningioma   . Frequent falls   . BPH (benign prostatic hypertrophy)   . Chronic kidney disease     CHRONIC KIDNEY DISEASE, 2  . Anemia   . Thrombocytopenia   . Stroke     Cerebellar, 2013; WALKS WITH WALKER, ABLE TO DRESS AND BATHE HIMSELF BUT FAMILY TRIES TO PROVIDE SUPERVISION BECAUSE OF HIS HX OF FALL AND WEAKNESS LEGS, ARMS   . Incontinence of urine     SOME INCONTINENCE   Past Surgical History  Procedure Laterality Date  . Tube put in ear yrs ago    . Transurethral resection of prostate N/A 05/19/2013    Procedure: TRANSURETHRAL RESECTION OF THE PROSTATE WITH GYRUS INSTRUMENTS;  Surgeon: Ailene Rud, MD;  Location: [facility];  Service: Urology;  Laterality: N/A;   Family History  Problem Relation Age of Onset  . Diabetes Mother   . Heart disease Mother    History  Substance Use Topics  . Smoking status: Former Smoker    Types: Cigarettes  . Smokeless tobacco: Not on file  . Alcohol Use: No     Comment: QUIT SMOKING 50 YRS AGO    Review of Systems Unable to assess due to mental status.     Allergies  Review of patient's allergies indicates no known allergies.  Home Medications   Current Outpatient Rx  Name  Route  Sig  Dispense  Refill   . atorvastatin (LIPITOR) 20 MG tablet   Oral   Take 20 mg by mouth daily at 6 PM.         . benazepril (LOTENSIN) 20 MG tablet   Oral   Take 20 mg by mouth every morning.         . Cholecalciferol (VITAMIN D PO)   Oral   Take 1 tablet by mouth daily.         . clopidogrel (PLAVIX) 75 MG tablet   Oral   Take 75 mg by mouth daily with breakfast.         . Iron-Vitamins (GERITOL PO)   Oral   Take 1 tablet by mouth daily.         . metoprolol tartrate (LOPRESSOR) 25 MG tablet   Oral   Take 25 mg by mouth 2 (two) times daily.         Marland Kitchen omega-3 acid ethyl esters (LOVAZA) 1 G capsule   Oral   Take 1 g by mouth daily.         . traMADol-acetaminophen (ULTRACET) 37.5-325 MG per tablet   Oral   Take 1 tablet by mouth every 6 (six) hours as needed for moderate pain.         Marland Kitchen  sulfamethoxazole-trimethoprim (BACTRIM DS) 800-160 MG per tablet   Oral   Take 1 tablet by mouth 2 (two) times daily.   20 tablet   1    BP 159/88  Pulse 79  Temp(Src) 97.9 F (36.6 C) (Oral)  Resp 18  SpO2 100% Physical Exam  Nursing note and vitals reviewed. Constitutional: He appears well-developed and well-nourished.  HENT:  Head: Normocephalic and atraumatic.  Eyes: EOM are normal. Pupils are equal, round, and reactive to light.  Neck: Normal range of motion. Neck supple.  Cardiovascular: Normal rate, normal heart sounds and intact distal pulses.   Pulmonary/Chest: Effort normal and breath sounds normal.  Abdominal: Bowel sounds are normal. He exhibits no distension. There is no tenderness.  Musculoskeletal: Normal range of motion. He exhibits edema (bilateral LE edema L>R). He exhibits no tenderness.  Neurological: He is alert. No cranial nerve deficit.  Skin: Skin is warm and dry. No rash noted.  Psychiatric: He has a normal mood and affect.    ED Course  Procedures (including critical care time) Labs Review Labs Reviewed  CBC WITH DIFFERENTIAL - Abnormal; Notable  for the following:    RBC 3.80 (*)    Hemoglobin 11.6 (*)    HCT 35.1 (*)    All other components within normal limits  APTT  PROTIME-INR  BASIC METABOLIC PANEL  URINALYSIS, ROUTINE W REFLEX MICROSCOPIC   Imaging Review No results found.   EKG Interpretation None      MDM   Final diagnoses:  DVT (deep venous thrombosis)   Prelim report on doppler is positive L DVT. Spoke with IM resident who will come evaluate the patient in the ED.    Charles B. Karle Starch, MD 06/10/13 [phone]

## 2013-06-10 NOTE — Telephone Encounter (Signed)
CSW received message from Triage nurse, pt's home health agency states daughter is requesting add'l DME and services.  CSW placed returned call to [name] to determine needs.  CSW spoke with daughter.  [name] voiced concern.

## 2013-06-10 NOTE — Telephone Encounter (Signed)
[name] has become increasingly weaker after his procedure this month.  Daughter states pt leg has swollen and pt is hesitant to place any weight on the leg.  Daughter is currently working full time and assisting [name] during her lunch hours and after work.  Due to pt's limited mobility, daughter is having increasing difficulty providing hands on care.  Daughter is requesting if SNF placement is an option.  [name] is current with [facility] complete.  Daughter does not have a preference of facilities but inquired about [facility].  Helene Kelp is Software engineer for [employer].  CSW placed call and left message with [facility] admissions.

## 2013-06-10 NOTE — ED Notes (Signed)
Pt from home.  EMS called out for c/o LLE edema.  Pt's is swollen from foot to thigh.  No hx of CHF.  Pt had a prostate procedure on 03/09.  Pt is HOH and has hx of dementia.

## 2013-06-10 NOTE — ED Notes (Signed)
Pt presents to ED via [facility], pt had a TURP 05/19/2013, family is concerned about swelling to his LLE for several days. Swelling noted to BLE with increase swelling and pitting edema to LLE

## 2013-06-10 NOTE — ED Notes (Signed)
Family at bedside. 

## 2013-06-10 NOTE — ED Notes (Signed)
MD at bedside. 

## 2013-06-10 NOTE — H&P (Signed)
Date: 06/10/2013               Patient Name:  [name] MRN: [id number]  DOB: Jan 28, 1925 Age / Sex: 80 y.o., male   PCP: Joni Reining, DO         Medical Service: Internal Medicine Teaching Service         Attending Physician: Dr. Axel Filler, MD    First Contact: MS4 Brandon  Pager: [phone]  Second Contact: Dr. Eulas Post Pager: [phone]       After Hours (After 5p/  First Contact Pager: [phone]  weekends / holidays): Second Contact Pager: [phone]   Chief Complaint: Left leg pain and swelling  History of Present Illness:   [name] is a 80 year old man with past medical history of hypertension, BPH s/p TURP on 05/19/13, cerebellar stroke in 2013, CKD Stage II, and hyperlipidemia who presents with left leg pain and swelling since his TURP procedure.  History obtained from daughter as patient is very hard of hearing. Per daughter pt had TURP procedure due to BPH on March 9 with no complications. Pt then began having left ankle swelling a few days after surgery that then extended up his lower leg and now up to his thigh with worsening pain and tenderness. Pt has frequent falls s/p cerebellar stroke in 2013 and recently with no injuries or trauma to his left leg. There is no warmth or erythema. He has been working with home PT who noticed his swelling was worsening and has been unable to bear weight on his leg (previously was using walker to ambulate).  He denies dyspnea, chest pain, or palpitations. He has never had prior blood clot before. He has been on plavix daily after the stroke.  He was seen in follow-up with his urologist with improvement of his BPH symptoms and mild persistence of hematuria. He also endorses pain on the right side of his flank/back and perirectal area. He has no recent weight loss but per daughter he has had decreased PO intake since his surgery.      Meds: Current Facility-Administered Medications  Medication Dose Route Frequency Provider Last Rate  Last Dose  . enoxaparin (LOVENOX) injection 80 mg  80 mg Subcutaneous Q12H Rande Lawman Rumbarger, [facility]       Current Outpatient Prescriptions  Medication Sig Dispense Refill  . atorvastatin (LIPITOR) 20 MG tablet Take 20 mg by mouth daily at 6 PM.      . benazepril (LOTENSIN) 20 MG tablet Take 20 mg by mouth every morning.      . Cholecalciferol (VITAMIN D PO) Take 1 tablet by mouth daily.      . clopidogrel (PLAVIX) 75 MG tablet Take 75 mg by mouth daily with breakfast.      . Iron-Vitamins (GERITOL PO) Take 1 tablet by mouth daily.      . metoprolol tartrate (LOPRESSOR) 25 MG tablet Take 25 mg by mouth 2 (two) times daily.      Marland Kitchen omega-3 acid ethyl esters (LOVAZA) 1 G capsule Take 1 g by mouth daily.      . traMADol-acetaminophen (ULTRACET) 37.5-325 MG per tablet Take 1 tablet by mouth every 6 (six) hours as needed for moderate pain.      Marland Kitchen sulfamethoxazole-trimethoprim (BACTRIM DS) 800-160 MG per tablet Take 1 tablet by mouth 2 (two) times daily.  20 tablet  1    Allergies: Allergies as of 06/10/2013  . (No Known Allergies)   Past Medical History  Diagnosis  Date  . Hypertension   . Difficulty hearing, right     BILATERAL HEARING LOSS - BEST TO TRY TO SPEAK INTO LEFT EAR  . Meningioma   . Frequent falls   . BPH (benign prostatic hypertrophy)   . Chronic kidney disease     CHRONIC KIDNEY DISEASE, 2  . Anemia   . Thrombocytopenia   . Stroke     Cerebellar, 2013; WALKS WITH WALKER, ABLE TO DRESS AND BATHE HIMSELF BUT FAMILY TRIES TO PROVIDE SUPERVISION BECAUSE OF HIS HX OF FALL AND WEAKNESS LEGS, ARMS   . Incontinence of urine     SOME INCONTINENCE   Past Surgical History  Procedure Laterality Date  . Tube put in ear yrs ago    . Transurethral resection of prostate N/A 05/19/2013    Procedure: TRANSURETHRAL RESECTION OF THE PROSTATE WITH GYRUS INSTRUMENTS;  Surgeon: Ailene Rud, MD;  Location: [facility];  Service: Urology;  Laterality: N/A;   Family History  Problem  Relation Age of Onset  . Diabetes Mother   . Heart disease Mother    History   Social History  . Marital Status: Widowed    Spouse Name: N/A    Number of Children: N/A  . Years of Education: N/A   Occupational History  . Not on file.   Social History Main Topics  . Smoking status: Former Smoker    Types: Cigarettes  . Smokeless tobacco: Not on file  . Alcohol Use: No     Comment: QUIT SMOKING 50 YRS AGO  . Drug Use: No  . Sexual Activity: Not on file   Other Topics Concern  . Not on file   Social History Narrative   Lives with daughter. Ambulates w walker. Can make basic meals (cereal). Very hard of hearing.     Review of Systems: Review of Systems  Constitutional: Positive for malaise/fatigue. Negative for fever, chills, weight loss and diaphoresis.  HENT: Negative for congestion and sore throat.        Hard of hearing  Eyes: Negative for blurred vision.  Respiratory: Negative for cough and shortness of breath.   Cardiovascular: Positive for leg swelling. Negative for chest pain and palpitations.  Gastrointestinal: Negative for nausea, vomiting, abdominal pain, diarrhea and constipation.  Genitourinary: Positive for hematuria and flank pain (right sided).       Urinary incontinence   Musculoskeletal: Positive for falls.  Skin: Negative for rash.  Neurological: Positive for weakness. Negative for dizziness, sensory change, speech change, focal weakness and headaches.     Physical Exam: Blood pressure 149/93, pulse 76, temperature 97.9 F (36.6 C), temperature source Oral, resp. rate 14, SpO2 100.00%. Physical Exam  Constitutional: He is oriented to person, place, and time. No distress.  Thin appearing  HENT:  Head: Normocephalic and atraumatic.  Right Ear: External ear normal.  Left Ear: External ear normal.  Nose: Nose normal.  Mouth/Throat: Oropharynx is clear and moist. No oropharyngeal exudate.  Eyes: Conjunctivae and EOM are normal. Pupils are equal,  round, and reactive to light. Right eye exhibits no discharge. Left eye exhibits no discharge. No scleral icterus.  Neck: Normal range of motion. Neck supple.  Cardiovascular: Normal rate and regular rhythm.   Pulmonary/Chest: Effort normal and breath sounds normal.  Anterior lung fields clear to auscultation (posterior fields could not be examined due to pain with leaning forward)  Abdominal: Soft. Bowel sounds are normal. He exhibits no distension. There is no tenderness. There is no rebound.  Musculoskeletal: He exhibits edema (+2 left LE edema up to thigh without erythema or warmth) and tenderness.  Normal PT and DP pulses b/l  Decreased ROM of b/l LE   Neurological: He is alert and oriented to person, place, and time. No cranial nerve deficit.  Skin: Skin is warm and dry. He is not diaphoretic.  Areas of hypopigmentation on b/l LE  Psychiatric: He has a normal mood and affect. His behavior is normal. Judgment and thought content normal.     Lab results: Basic Metabolic Panel:  Recent Labs  06/10/13 1437  NA 143  K 4.7  CL 106  CO2 26  GLUCOSE 113*  BUN 23  CREATININE 1.13  CALCIUM 11.4*    CBC:  Recent Labs  06/10/13 1437  WBC 7.2  NEUTROABS 5.3  HGB 11.6*  HCT 35.1*  MCV 92.4  PLT 181    Coagulation:  Recent Labs  06/10/13 1437  LABPROT 14.1  INR 1.11     Other results: EKG: None available   Assessment & Plan by Problem:  Acute Symptomatic Provoked Left Proximal Non-Occlusive DVT and SVT -  Currently with left LE swelling and pain. Pt with left LE swelling after TURP procedure on 05/19/13. Bilateral LE doppler revealing left DVT of saphenofemoral junction, common femoral, profunda, femoral, popliteal, posterior tibial, and peroneal vein. Also with superficial thrombosis in the left greater saphenous vein. Pt with no prior history of DVT or PE. No recent falls, injury, or trauma. Etiology most likely due to recent surgery and sedentary state. No history  of May-Thurner syndrome.  No acute onset dyspnea, hypoxia, tachypnea, or tacycardia to suggest PE.  -Start enoxaparin per pharmacy for duration of 3-6 months  -Compression stockings -Monitor for acute PE  -Obtain 12-lead EKG -Obtain PT and CM consults  BPH s/p TURP Procedure on 05/19/13  - Currently with urinary incontinence (at baseline) and mild hematuria. Pt with history of elevated PSA (06/21/12 5.08/25%) and hematuria. Pt also with history of bladder obstructing stone  and 2 mm nonobstructing stone in the left kidney. Surgical pathology report on 05/19/13 revealed benign prostatic tissue with glandular atrophy and associated fibromuscular hyperplasia with no evidence of malignancy.  -Obtain UA -Pt to follow-up with urologist Dr. Gaynelle Arabian  Hypertension - Currently hypertensive -Continue home benazepril 20 mg daily  -Continue home metoprolol tartrate 25 mg BID   Frequent falls in setting of Cerebellar Stroke - Currently with no change in neurological status. Pt with CVA in 2013 with resulting frequent falls and requirement for walker use.   -Continue home plavix 75 mg daily (consider holding in setting of AC therapy) -Obtain PT/OT consult  Chronic Hypercalcemia - Pt with chronic elevated calcium levels and PTH on 04/24/13 that was high-normal (47.3), most likely due to primary hyperparathyroidism. Pt with history of kidney stones.  Pt at home on cholecalciferol 1000 U daily.   -Obtain albumin to calculate corrected calcium -Consider obtaining PTHrP and 25-OH Vitamin D, and 1,25 OH-Vitamin D levels, UPEP, SPEP (back pain, anemia) -Continue home cholecalciferol 1000 U  Chronic Normocytic Anemia - Hg near baseline 12 with no hemodynamic instability with possibly mild hematuria. Last anemia panel unknown. Pt at home on iron-vitamins daily.  -Monitor CBC in setting of AC therapy   -Monitor for bleeding -Consider anemia panel  Chronic Kidney Disease Stage 2 - Cr near baseline of 1.0 with normal  urine output. -Monitor renal function -Monitor volume status  Hyperlipidemia - Last lipid panel on 07/20/11 that was normal.  -  Continue home atorvastatin 20 mg daily   Diet: Heart Code: Full  Dispo: Disposition is deferred at this time, awaiting improvement of current medical problems.   The patient does have a current PCP Joni Reining, DO) and does need an [facility] hospital follow-up appointment after discharge.  The patient does have transportation limitations that hinder transportation to clinic appointments.  Signed: Juluis Mire, MD 06/10/2013, 6:37 PM

## 2013-06-10 NOTE — Progress Notes (Signed)
Bilateral lower extremity venous duplex completed.  Right:  No evidence of DVT, superficial thrombosis, or Baker's cyst.  Left: DVT noted in the saphenofemoral junction, common femoral, profunda, femoral, popliteal, posterior tibial, and peroneal vein.  There appears to be superficial thrombosis in the left greater saphenous vein.  No Baker's cyst.

## 2013-06-11 DIAGNOSIS — D649 Anemia, unspecified: Secondary | ICD-10-CM | POA: Diagnosis present

## 2013-06-11 LAB — URINALYSIS, ROUTINE W REFLEX MICROSCOPIC
Bilirubin Urine: NEGATIVE
Glucose, UA: NEGATIVE mg/dL
Ketones, ur: NEGATIVE mg/dL
Nitrite: NEGATIVE
Protein, ur: 30 mg/dL — AB
Specific Gravity, Urine: 1.015 (ref 1.005–1.030)
Urobilinogen, UA: 1 mg/dL (ref 0.0–1.0)
pH: 8 (ref 5.0–8.0)

## 2013-06-11 LAB — URINE MICROSCOPIC-ADD ON

## 2013-06-11 LAB — CBC
HCT: 29.5 % — ABNORMAL LOW (ref 39.0–52.0)
HCT: 30.1 % — ABNORMAL LOW (ref 39.0–52.0)
Hemoglobin: 9.6 g/dL — ABNORMAL LOW (ref 13.0–17.0)
Hemoglobin: 9.9 g/dL — ABNORMAL LOW (ref 13.0–17.0)
MCH: 30 pg (ref 26.0–34.0)
MCH: 30.2 pg (ref 26.0–34.0)
MCHC: 32.5 g/dL (ref 30.0–36.0)
MCHC: 32.9 g/dL (ref 30.0–36.0)
MCV: 92.2 fL (ref 78.0–100.0)
MCV: 91.8 fL (ref 78.0–100.0)
Platelets: 180 10*3/uL (ref 150–400)
Platelets: 181 10*3/uL (ref 150–400)
RBC: 3.2 MIL/uL — ABNORMAL LOW (ref 4.22–5.81)
RBC: 3.28 MIL/uL — ABNORMAL LOW (ref 4.22–5.81)
RDW: 14.2 % (ref 11.5–15.5)
RDW: 14.3 % (ref 11.5–15.5)
WBC: 6.4 10*3/uL (ref 4.0–10.5)
WBC: 6.6 10*3/uL (ref 4.0–10.5)

## 2013-06-11 LAB — BASIC METABOLIC PANEL
BUN: 24 mg/dL — ABNORMAL HIGH (ref 6–23)
CO2: 25 mEq/L (ref 19–32)
Calcium: 10.9 mg/dL — ABNORMAL HIGH (ref 8.4–10.5)
Chloride: 105 mEq/L (ref 96–112)
Creatinine, Ser: 1.12 mg/dL (ref 0.50–1.35)
GFR calc Af Amer: 67 mL/min — ABNORMAL LOW (ref >90)
GFR calc non Af Amer: 57 mL/min — ABNORMAL LOW (ref >90)
Glucose, Bld: 107 mg/dL — ABNORMAL HIGH (ref 70–99)
Potassium: 4.3 mEq/L (ref 3.7–5.3)
Sodium: 141 mEq/L (ref 137–147)

## 2013-06-11 NOTE — Progress Notes (Signed)
CSW was stopped by MD that patient's daughter is interested in snf. CSW will follow patient and is awaiting PT eval before social worker completes assessment.  Jeanette Caprice, MSW, [place]

## 2013-06-11 NOTE — Progress Notes (Signed)
[facility]  Patient Status: Active (receiving services up to time of hospitalization)  [facility] is providing the following services: RN and PT  If patient discharges after hours, please call [phone].   [name] 06/11/2013, 8:37 PM

## 2013-06-11 NOTE — Progress Notes (Signed)
Subjective: Pt doing well this morning. Since starting the heparin his LLE pain has improved.   Objective: Vital signs in last 24 hours: Filed Vitals:   06/10/13 2026 06/11/13 0500 06/11/13 1109 06/11/13 1330  BP: 142/65 141/86 122/74 119/62  Pulse: 79 75 75 66  Temp: 98.5 F (36.9 C) 98.4 F (36.9 C)  98.3 F (36.8 C)  TempSrc: Oral Oral  Oral  Resp: 18 18  17   Height: 5\' 11"  (1.803 m)     Weight: 159 lb 13.3 oz (72.5 kg)     SpO2: 99% 99%  97%   Weight change:   Intake/Output Summary (Last 24 hours) at 06/11/13 1444 Last data filed at 06/11/13 1300  Gross per 24 hour  Intake    340 ml  Output    700 ml  Net   -360 ml   Vitals reviewed. General: Sitting up in bed eating lunch, NAD HEENT: PERRL, EOMI Cardiac: RRR, no rubs, murmurs or gallops Pulm: Clear to auscultation bilaterally, no wheezes, rales, or rhonchi Abd: Soft, nontender, nondistended, BS present Ext: Warm and well perfused. LLE with 2-3+ pitting edema, no erythema appreciated today. Decreased pain in the with strong DP pulses in BLE. Moves all 4 extremities. Neuro: Alert and oriented X3, cranial nerves II-XII grossly intact, non focal   Lab Results: Basic Metabolic Panel:  Recent Labs Lab 06/10/13 1437 06/11/13 0315  NA 143 141  K 4.7 4.3  CL 106 105  CO2 26 25  GLUCOSE 113* 107*  BUN 23 24*  CREATININE 1.13 1.12  CALCIUM 11.4* 10.9*  MG 2.0  --    Liver Function Tests: No results found for this basename: AST, ALT, ALKPHOS, BILITOT, PROT, ALBUMIN,  in the last 168 hours No results found for this basename: LIPASE, AMYLASE,  in the last 168 hours No results found for this basename: AMMONIA,  in the last 168 hours  CBC:  Recent Labs Lab 06/10/13 1437 06/11/13 0315 06/11/13 1050  WBC 7.2 6.4 6.6  NEUTROABS 5.3  --   --   HGB 11.6* 9.6* 9.9*  HCT 35.1* 29.5* 30.1*  MCV 92.4 92.2 91.8  PLT 181 180 181   Coagulation:  Recent Labs Lab 06/10/13 1437  LABPROT 14.1  INR 1.11    Urinalysis:  Recent Labs Lab 06/11/13 0323  COLORURINE AMBER*  LABSPEC 1.015  PHURINE 8.0  GLUCOSEU NEGATIVE  HGBUR LARGE*  BILIRUBINUR NEGATIVE  KETONESUR NEGATIVE  PROTEINUR 30*  UROBILINOGEN 1.0  NITRITE NEGATIVE  LEUKOCYTESUR LARGE*    Micro Results: No results found for this or any previous visit (from the past 240 hour(s)).  Studies/Results: No results found.  Medications: I have reviewed the patient's current medications. Scheduled Meds: . atorvastatin  20 mg Oral q1800  . benazepril  20 mg Oral q morning - 10a  . cholecalciferol  1,000 Units Oral Daily  . clopidogrel  75 mg Oral Q breakfast  . enoxaparin (LOVENOX) injection  80 mg Subcutaneous Q12H  . metoprolol tartrate  25 mg Oral BID  . omega-3 acid ethyl esters  1 g Oral Daily  . sodium chloride  3 mL Intravenous Q12H   Continuous Infusions:  PRN Meds:.  Assessment/Plan: 80yo M w/ PMH HTN, BPH s/p TURP on 05/19/13, cerebellar stroke '13, CKD Stage II, and HLD who presented with acute LLE edema and pain and was found to have large clot burden in her LLE venous system.   #Acute LLE DVT: Pt s/p TURPs on 05/19/13 for  BPH. Since his procedure, per his daughter, he was having progressive LLE edema and increasing pain with motion of the LLE. He was brought to the ED on 3/31 2/2 to this worsening edema and pain. BLE dopplers were ordered and reveled a large clot burden throughout the left leg. Lovenox was started on admission and has been continued. We will transition to po anticoagulation, likely Xarelto vs Coumadin depending on his insurance, CM consulted to look into this. He is going to a SNF at d/c, and will be in a controlled environment so falls are less of a concern on anticoagulation.  - Continue Heparin drip - Compression stockings  - Transition to po anticoagulation  - F/u with CM re: insurance coverage of po anticoag.   #Anemia, normocytic: H/o TURPs 3/9 with hematuria that has resolved per the daughter.  No hematuria this admission. Hgb 11.6 on admission, previously 12.2 prior to procedure. Lovenox started on admission and Hgb 9.6, an stable today. Continuing the Lovenox for now and will transition to po anticoagulation, likely tomorrow. - am CBC  #HTN: BP well controlled on home lopressor and benazepril  - Lopressor 25mg  BID - Benazepril 20mg  daily   PLEASE REFER TO MS-4 NOTE FOR THE REMAINDER OF THE PLAN   Dispo: Disposition is deferred at this time, awaiting improvement of current medical problems.  Anticipated discharge in approximately 1-2 day(s) to SNF.   The patient does have a current PCP Joni Reining, DO) and does need an [facility] hospital follow-up appointment after discharge.  The patient does not have transportation limitations that hinder transportation to clinic appointments.  .Services Needed at time of discharge: Y = Yes, Blank = No PT:   OT:   RN:   Equipment:   Other:     LOS: 1 day   Otho Bellows, MD 06/11/2013, 2:44 PM

## 2013-06-11 NOTE — Telephone Encounter (Signed)
Review of chart pt was admitted.  CSW placed call and left message from [place].

## 2013-06-11 NOTE — Progress Notes (Signed)
Subjective: Patient has continued stable lower extremity pain and swelling. No chest pain, SOB, or abdominal pain currently.  Objective: Vital signs in last 24 hours: Filed Vitals:   06/10/13 1943 06/10/13 2026 06/11/13 0500 06/11/13 1109  BP: 154/89 142/65 141/86 122/74  Pulse:  79 75 75  Temp: 98.1 F (36.7 C) 98.5 F (36.9 C) 98.4 F (36.9 C)   TempSrc: Oral Oral Oral   Resp: 16 18 18    Height:  5\' 11"  (1.803 m)    Weight:  72.5 kg (159 lb 13.3 oz)    SpO2: 95% 99% 99%    Weight change:   Intake/Output Summary (Last 24 hours) at 06/11/13 1146 Last data filed at 06/11/13 0846  Gross per 24 hour  Intake    220 ml  Output      0 ml  Net    220 ml    Physical Exam: General appearance: Alert and oriented x3. Patient is lying comfortably and in good spirits. HEENT: Pupils are equal, round and reactive. Extra occular movements are intact. Conjunctivae clear. Mucous membranes are dry. Neck: Neck supple. No JVD. No carotid bruit. No cervical or supraclavicular lymphadenopathy.  Cardiovascular: Regular rate and rhythm, normal S1S2, no murmurs/rubs/gallops appreciated. Pulmonary: Normal work of breathing. Chest clear to auscultation bilaterally, no wheezes/rales/rhonchi. Abdomen: Normoactive bowel sounds. Soft, non-tender/non-distended. No organomegaly. Extremities:   Right lower: Areas of hypopigmentation. +1-2 LE edema up to calf.   Left lower: Areas of hypopigmentation. +2 LE edema up to thigh without erythema or warmth. Pulses 2+. Skin: Skin color, texture, turgor normal. No rashes or lesions.    Lab Results: BMET    Component Value Date/Time   NA 141 06/11/2013 0315   K 4.3 06/11/2013 0315   CL 105 06/11/2013 0315   CO2 25 06/11/2013 0315   GLUCOSE 107* 06/11/2013 0315   BUN 24* 06/11/2013 0315   CREATININE 1.12 06/11/2013 0315   CREATININE 0.94 04/24/2013 1648   CALCIUM 10.9* 06/11/2013 0315   GFRNONAA 57* 06/11/2013 0315   GFRNONAA 73 04/24/2013 1648   GFRAA 67* 06/11/2013 0315   GFRAA 85 04/24/2013 1648   CBC    Component Value Date/Time   WBC 6.6 06/11/2013 1050   RBC 3.28* 06/11/2013 1050   HGB 9.9* 06/11/2013 1050   HCT 30.1* 06/11/2013 1050   PLT 181 06/11/2013 1050   MCV 91.8 06/11/2013 1050   MCH 30.2 06/11/2013 1050   MCHC 32.9 06/11/2013 1050   RDW 14.3 06/11/2013 1050   LYMPHSABS 1.4 06/10/2013 1437   MONOABS 0.5 06/10/2013 1437   EOSABS 0.0 06/10/2013 1437   BASOSABS 0.0 06/10/2013 1437   Urinalysis    Component Value Date/Time   COLORURINE AMBER* 06/11/2013 0323   APPEARANCEUR TURBID* 06/11/2013 0323   LABSPEC 1.015 06/11/2013 0323   PHURINE 8.0 06/11/2013 0323   GLUCOSEU NEGATIVE 06/11/2013 0323   HGBUR LARGE* 06/11/2013 0323   BILIRUBINUR NEGATIVE 06/11/2013 0323   KETONESUR NEGATIVE 06/11/2013 0323   PROTEINUR 30* 06/11/2013 0323   UROBILINOGEN 1.0 06/11/2013 0323   NITRITE NEGATIVE 06/11/2013 0323   LEUKOCYTESUR LARGE* 06/11/2013 0323    Urine Microscopic-add on:  Status Squamous Epithelial / LPF: RARE WBC, UA: 21-50  RBC / HPF: TOO NUMEROUS TO COUNT Bacteria,UA: FEW (A)  Crystals: TRIPLE PHOSPHATE CRYSTALS (A)    Studies/Results: Bilateral LE Duplex: Right: No evidence of DVT, superficial thrombosis, or Baker's cyst. Left: DVT noted in the saphenofemoral junction, common femoral, profunda, femoral, popliteal, posterior tibial, and peroneal vein.  There appears to be superficial thrombosis in the left greater saphenous vein. No Baker's cyst.   EKG (4/1): Normal Sinus Rhythm.   Medications: I have reviewed the patient's current medications. Scheduled Meds: . atorvastatin  20 mg Oral q1800  . benazepril  20 mg Oral q morning - 10a  . cholecalciferol  1,000 Units Oral Daily  . clopidogrel  75 mg Oral Q breakfast  . enoxaparin (LOVENOX) injection  80 mg Subcutaneous Q12H  . metoprolol tartrate  25 mg Oral BID  . omega-3 acid ethyl esters  1 g Oral Daily  . sodium chloride  3 mL Intravenous Q12H   Continuous Infusions:  PRN Meds:.  Assessment/Plan:  **Acute  Symptomatic Provoked Left Proximal Non-Occlusive DVT and SVT - Currently with left LE swelling and pain. Pt with left LE swelling after TURP procedure on 05/19/13. Bilateral LE doppler revealing left DVT of saphenofemoral junction, common femoral, profunda, femoral, popliteal, posterior tibial, and peroneal vein. Also with superficial thrombosis in the left greater saphenous vein. Pt with no prior history of DVT or PE. No recent falls, injury, or trauma. Etiology most likely due to recent surgery and sedentary state. No history of May-Thurner syndrome. No acute onset dyspnea, hypoxia, tachypnea, or tacycardia to suggest PE.  -Start enoxaparin per pharmacy for duration of 3-6 months  -Compression stockings  -Monitor for acute PE  -Obtained 12-lead EKG: Normal sinus  -Obtaining PT and CM consults   ** BPH s/p TURP Procedure on 05/19/13 - Currently with urinary incontinence (at baseline) and mild hematuria. Pt with history of elevated PSA (06/21/12 5.08/25%) and hematuria. Pt also with history of bladder obstructing stone and 2 mm nonobstructing stone in the left kidney. Surgical pathology report on 05/19/13 revealed benign prostatic tissue with glandular atrophy and associated fibromuscular hyperplasia with no evidence of malignancy.   -Pt to follow-up with urologist Dr. Gaynelle Arabian, awaiting call-back.   ** Hypertension - Hypertensive on presentation, currently normotensive. Continue to monitor.   -Continue home benazepril 20 mg daily  -Continue home metoprolol tartrate 25 mg BID   ** Frequent falls in setting of Cerebellar Stroke - Currently with no change in neurological status. Pt with CVA in 2013 with resulting frequent falls and requirement for walker use.  -Continue home plavix 75 mg daily (consider holding in setting of AC therapy)  -Obtaining PT/OT consult   ** Chronic Hypercalcemia - Pt with chronic elevated calcium levels and PTH on 04/24/13 that was high-normal (47.3), most likely due to primary  hyperparathyroidism. Pt with history of kidney stones. Pt at home on cholecalciferol 1000 U daily.  -Obtain albumin to calculate corrected calcium  -Consider obtaining PTHrP and 25-OH Vitamin D, and 1,25 OH-Vitamin D levels, UPEP, SPEP (back pain, anemia)  -Continue home cholecalciferol 1000 U   ** Chronic Normocytic Anemia - Hg near baseline 12 with no hemodynamic instability with possibly mild hematuria. Last anemia panel unknown. Pt at home on iron-vitamins daily.  -Monitor CBC in setting of AC therapy  -Monitor for bleeding. H&H currently stable.   -Consider anemia panel   ** Chronic Kidney Disease Stage 2 - Cr near baseline of 1.0 with normal urine output.  -Monitor renal function  -Monitor volume status   ** Hyperlipidemia - Last lipid panel on 07/20/11 that was normal.  -Continue home atorvastatin 20 mg daily   Diet: Heart  Code: Full   Dispo: Disposition is deferred at this time, awaiting improvement of current medical problems.   The patient does have a current  PCP Joni Reining, DO) and does need an [facility] hospital follow-up appointment after discharge.  The patient does have transportation limitations that hinder transportation to clinic appointments. Speaking with Social Work about the possibility of SNF placement (awaiting PT note).    This is a Careers information officer Note.  The care of the patient was discussed with Dr. Eulas Post and the assessment and plan formulated with their assistance.  Please see their attached note for official documentation of the daily encounter.   LOS: 1 day   Isac Sarna, Med Student 06/11/2013, 11:46 AM

## 2013-06-11 NOTE — H&P (Signed)
Internal Medicine Attending Admission Note Date: 06/11/2013  Patient name: [name] Medical record number: [id number] Date of birth: 1925-02-03 Age: 80 y.o. Gender: male  I saw and evaluated the patient. I reviewed the resident's note and I agree with the resident's findings and plan as documented in the resident's note, with the following additional comments.  Chief Complaint(s): Left leg pain and swelling  History - key components related to admission: Patient is an 80 year old man with history of hypertension, BPH status post TURP on 05/19/2013, cerebellar stroke, chronic kidney disease, and other problems as outlined in the medical history, admitted with left leg pain and swelling which has progressed over the past few weeks.  Patient is very hard of hearing.  Physical Exam - key components related to admission:  Filed Vitals:   06/10/13 2026 06/11/13 0500 06/11/13 1109 06/11/13 1330  BP: 142/65 141/86 122/74 119/62  Pulse: 79 75 75 66  Temp: 98.5 F (36.9 C) 98.4 F (36.9 C)  98.3 F (36.8 C)  TempSrc: Oral Oral  Oral  Resp: 18 18  17   Height: 5\' 11"  (1.803 m)     Weight: 159 lb 13.3 oz (72.5 kg)     SpO2: 99% 99%  97%   General: Alert, no distress Lungs: Clear Heart: Regular; no extra sounds or murmurs Abdomen: Bowel sounds present, soft, nontender Extremities: 1+ right lower extremity edema; 3+ left lower extremity edema; no erythema  Lab results:   Basic Metabolic Panel:  Recent Labs  06/10/13 1437 06/11/13 0315  NA 143 141  K 4.7 4.3  CL 106 105  CO2 26 25  GLUCOSE 113* 107*  BUN 23 24*  CREATININE 1.13 1.12  CALCIUM 11.4* 10.9*  MG 2.0  --     PTH  Date Value Ref Range Status  04/24/2013 47.3  14.0 - 72.0 pg/mL Final    CBC:  Recent Labs  06/11/13 0315 06/11/13 1050  WBC 6.4 6.6  HGB 9.6* 9.9*  HCT 29.5* 30.1*  MCV 92.2 91.8  PLT 180 181    Recent Labs  06/10/13 1437  NEUTROABS 5.3  LYMPHSABS 1.4  MONOABS 0.5  EOSABS 0.0   BASOSABS 0.0     Coagulation:  Recent Labs  06/10/13 1437  INR 1.11     Urinalysis    Component Value Date/Time   COLORURINE AMBER* 06/11/2013 0323   APPEARANCEUR TURBID* 06/11/2013 0323   LABSPEC 1.015 06/11/2013 0323   PHURINE 8.0 06/11/2013 0323   GLUCOSEU NEGATIVE 06/11/2013 0323   HGBUR LARGE* 06/11/2013 0323   BILIRUBINUR NEGATIVE 06/11/2013 0323   KETONESUR NEGATIVE 06/11/2013 0323   PROTEINUR 30* 06/11/2013 0323   UROBILINOGEN 1.0 06/11/2013 0323   NITRITE NEGATIVE 06/11/2013 0323   LEUKOCYTESUR LARGE* 06/11/2013 0323    Urine microscopic:  Recent Labs  06/11/13 0323  EPIU RARE  WBCU 21-50  RBCU TOO NUMEROUS TO COUNT  BACTERIA FEW*    Left lower extremity venous duplex study: Findings consistent with acute deep vein thrombosis involving the left saphenofemoral, common femoral, profunda, femoral, popliteal, posterior tibial, and peroneal veins with superficial thrombus in the left greater saphenous vein.   Other results: EKG: Normal sinus rhythm; non-specific intra-ventricular conduction delay; normal ECG   Assessment & Plan by Problem:  1.  Left lower extremity deep venous thrombosis.  Plan is treat with Lovenox with transition to oral anticoagulant with appropriate overlap; would determine whether a novel oral anticoagulant is an option given patient's insurance coverage.  Given his history of  falls, fall precautions will be very important for patient's safety.  2.  Frequent falls.  Patient has history of cerebellar stroke.  Plan is PT/OT consult; fall precautions.  3.  Hypertension.  Plan is continue home medications, adjust regimen as indicated.  4.  Hypercalcemia.  PTH was reportedly normal in February; would check PTHRP and vitamin D levels; SPEP and UPEP; follow calcium level.  5.  Disposition.  According to clinic social worker, family would like to pursue disposition to skilled nursing facility.  Patient certainly appears appropriate for this.  OT/PT consults are  pending.  6.  Other problems and plans as per the resident physician's note.

## 2013-06-11 NOTE — Care Management Note (Signed)
    Page 1 of 2   06/20/2013     4:48:18 PM   CARE MANAGEMENT NOTE 06/20/2013  Patient:  [name], [name]   Account Number:  [id number]  Date Initiated:  06/11/2013  Documentation initiated by:  Ikeisha Blumberg  Subjective/Objective Assessment:   PT ADM ON 3/31 WITH LT DVT.  PTA, PT RESIDES AT [facility].     Action/Plan:   CM REFERRAL FOR HOME LOVENOX/ENOXAPARIN.   Anticipated DC Date:  06/18/2013   Anticipated DC Plan:  SKILLED NURSING FACILITY  In-house referral  Clinical Social Worker      DC Planning Services  CM consult      Choice offered to / List presented to:             Status of service:  Completed, signed off Medicare Important Message given?   (If response is "NO", the following Medicare IM given date fields will be blank) Date Medicare IM given:   Date Additional Medicare IM given:    Discharge Disposition:  [place]  Per UR Regulation:  Reviewed for med. necessity/level of care/duration of stay  If discussed at [place] of Stay Meetings, dates discussed:    Comments:   06/20/13 Jodiann Ognibene,RN,BSN [phone] PT DISCHARGED TO SNF TODAY, PER CSW ARRANGEMENTS.  06/12/13 Lazariah Savard,RN,BSN [phone] PT/OT RECOMMENDING SNF AT DC; CSW CONSULTED TO FACILITATE DC TO SNF WHEN MEDICALLY STABLE FOR DC.   06/11/13 Tarrence Enck,RN,BSN [phone] PER BENEFITS CHECK:  tier 4- co-pay $95.00-  $165.00 deductible not met (so patient will pay up to $165.00 for this rx)  no auth required  [place], IF INDICATED, EXCEPT WE CAN OFFER FREE 30 DAY TRIAL CARD FOR FIRST RX.

## 2013-06-11 NOTE — Progress Notes (Signed)
UR completed 

## 2013-06-12 LAB — CBC
HCT: 29.1 % — ABNORMAL LOW (ref 39.0–52.0)
HCT: 30 % — ABNORMAL LOW (ref 39.0–52.0)
Hemoglobin: 9.5 g/dL — ABNORMAL LOW (ref 13.0–17.0)
Hemoglobin: 9.9 g/dL — ABNORMAL LOW (ref 13.0–17.0)
MCH: 30.1 pg (ref 26.0–34.0)
MCH: 30.3 pg (ref 26.0–34.0)
MCHC: 32.6 g/dL (ref 30.0–36.0)
MCHC: 33 g/dL (ref 30.0–36.0)
MCV: 92.1 fL (ref 78.0–100.0)
MCV: 91.7 fL (ref 78.0–100.0)
Platelets: 194 K/uL (ref 150–400)
Platelets: 217 10*3/uL (ref 150–400)
RBC: 3.16 MIL/uL — ABNORMAL LOW (ref 4.22–5.81)
RBC: 3.27 MIL/uL — ABNORMAL LOW (ref 4.22–5.81)
RDW: 14.4 % (ref 11.5–15.5)
RDW: 14.1 % (ref 11.5–15.5)
WBC: 5.7 K/uL (ref 4.0–10.5)
WBC: 5.6 10*3/uL (ref 4.0–10.5)

## 2013-06-12 LAB — BASIC METABOLIC PANEL WITH GFR
BUN: 20 mg/dL (ref 6–23)
CO2: 24 meq/L (ref 19–32)
Calcium: 9.9 mg/dL (ref 8.4–10.5)
Chloride: 103 meq/L (ref 96–112)
Creatinine, Ser: 1.1 mg/dL (ref 0.50–1.35)
GFR calc Af Amer: 68 mL/min — ABNORMAL LOW
GFR calc non Af Amer: 59 mL/min — ABNORMAL LOW
Glucose, Bld: 96 mg/dL (ref 70–99)
Potassium: 3.9 meq/L (ref 3.7–5.3)
Sodium: 139 meq/L (ref 137–147)

## 2013-06-12 MED ORDER — WARFARIN - PHARMACIST DOSING INPATIENT
Freq: Every day | Status: DC
  Administered 2013-06-13: 18:00:00

## 2013-06-12 MED ORDER — WARFARIN SODIUM 7.5 MG PO TABS
7.5000 mg | ORAL_TABLET | Freq: Once | ORAL | Status: AC
  Administered 2013-06-12: 7.5 mg via ORAL
  Filled 2013-06-12: qty 1

## 2013-06-12 MED ORDER — PATIENT'S GUIDE TO USING COUMADIN BOOK
Freq: Once | Status: AC
  Administered 2013-06-12: 13:00:00
  Filled 2013-06-12: qty 1

## 2013-06-12 MED ORDER — WARFARIN VIDEO: Freq: Once | Status: DC

## 2013-06-12 NOTE — Evaluation (Signed)
Physical Therapy Evaluation Patient Details Name: [name] MRN: [id number] DOB: 1924/06/14 Today's Date: 06/12/2013   History of Present Illness  Adm with LLE swelling of incr severity over past couple of weeks. +extensive LLE DVT PMHx- cerebellar CVA; falls, HTN, hearing loss (?Rt ear worse)  Clinical Impression  Pt admitted with LLE DVT, however currently limited by severe RIGHT knee pain. Pt unable to tell me if this is new pain.  Pt currently with functional limitations due to the deficits listed below (see PT Problem List).  Pt will benefit from skilled PT to increase their independence and safety with mobility to allow discharge to the venue listed below.       Follow Up Recommendations SNF;Supervision/Assistance - 24 hour    Equipment Recommendations  None recommended by PT    Recommendations for Other Services       Precautions / Restrictions Precautions Precautions: Fall      Mobility  Bed Mobility Overal bed mobility: Needs Assistance Bed Mobility: Rolling;Sidelying to Sit;Sit to Sidelying Rolling: Mod assist Sidelying to sit: Max assist     Sit to sidelying: Max assist General bed mobility comments: pt able to follow reaching for rails and initiating movements, however is weak and RLE is painful  Transfers Overall transfer level: Needs assistance Equipment used: Rolling walker (2 wheeled) Transfers: Sit to/from Stand Sit to Stand: Mod assist;From elevated surface         General transfer comment: bed elevated and pt pulls up on RW (anchored by PT) with assist for forward and upward weight shift  Ambulation/Gait             General Gait Details: unable  Stairs            Wheelchair Mobility    Modified Rankin (Stroke Patients Only)       Balance Overall balance assessment: Needs assistance Sitting-balance support: Bilateral upper extremity supported Sitting balance-Leahy Scale: Poor   Postural control: Posterior lean Standing  balance support: Bilateral upper extremity supported Standing balance-Leahy Scale: Poor                               Pertinent Vitals/Pain Pt grimacing with all movements of RIGHT knee; unable to bear weight (even with RW) to allow Lt foot to advance to walk    Home Living Family/patient expects to be discharged to:: Skilled nursing facility                 Additional Comments: per chart, daughter requesting SNF    Prior Function Level of Independence: Needs assistance   Gait / Transfers Assistance Needed: per chart, h/o falls s/p cerebellar CVA           Hand Dominance        Extremity/Trunk Assessment   Upper Extremity Assessment: Generalized weakness           Lower Extremity Assessment: RLE deficits/detail;Generalized weakness RLE Deficits / Details: pt wincing and indicates Rt lateral knee pain; strength 3-/5    Cervical / Trunk Assessment: Kyphotic  Communication   Communication: HOH  Cognition Arousal/Alertness: Awake/alert Behavior During Therapy: WFL for tasks assessed/performed Overall Cognitive Status: Difficult to assess                      General Comments      Exercises        Assessment/Plan    PT Assessment Patient needs continued  PT services  PT Diagnosis Difficulty walking;Acute pain   PT Problem List Decreased strength;Decreased range of motion;Decreased activity tolerance;Decreased balance;Decreased mobility;Decreased knowledge of use of DME;Pain  PT Treatment Interventions DME instruction;Gait training;Functional mobility training;Therapeutic activities;Therapeutic exercise;Balance training;Patient/family education   PT Goals (Current goals can be found in the Care Plan section) Acute Rehab PT Goals PT Goal Formulation: Patient unable to participate in goal setting Time For Goal Achievement: 06/26/13 Potential to Achieve Goals: Good    Frequency Min 2X/week   Barriers to discharge Decreased  caregiver support daughter unable to care for pt in his current state    Co-evaluation               End of Session Equipment Utilized During Treatment: Gait belt Activity Tolerance: Patient limited by pain Patient left: in chair;with call bell/phone within reach;with bed alarm set Nurse Communication: Mobility status    Functional Assessment Tool Used: clinical observation Functional Limitation: Mobility: Walking and moving around Mobility: Walking and Moving Around Current Status (V4259): At least 80 percent but less than 100 percent impaired, limited or restricted Mobility: Walking and Moving Around Goal Status [phone]): At least 1 percent but less than 20 percent impaired, limited or restricted    Time: 0947-1010 PT Time Calculation (min): 23 min   Charges:   PT Evaluation $Initial PT Evaluation Tier I: 1 Procedure PT Treatments $Gait Training: 8-22 mins   PT G Codes:   Functional Assessment Tool Used: clinical observation Functional Limitation: Mobility: Walking and moving around    Lavonn Maxcy 06/12/2013, 10:23 AM Pager [phone]

## 2013-06-12 NOTE — Progress Notes (Signed)
Subjective: Patient is doing well this morning.  He mentions continued, stable bilateral pain in his lower extremities, but his leg swelling has decreased.  He also had gross hematuria this morning which has begun to resolve.  No chest pain, SOB, or abdominal pain.   Objective: Vital signs in last 24 hours: Filed Vitals:   06/11/13 1109 06/11/13 1330 06/11/13 2023 06/12/13 0515  BP: 122/74 119/62 130/72 125/77  Pulse: 75 66 70 64  Temp:  98.3 F (36.8 C) 98.5 F (36.9 C) 98.4 F (36.9 C)  TempSrc:  Oral Oral Oral  Resp:  17 18 18   Height:      Weight:      SpO2:  97% 95% 93%   Weight change:   Intake/Output Summary (Last 24 hours) at 06/12/13 1150 Last data filed at 06/12/13 0800  Gross per 24 hour  Intake    360 ml  Output   1301 ml  Net   -941 ml    Physical Exam:  General appearance: Alert and oriented x3. Patient is lying comfortably and in good spirits.  HEENT: Pupils are equal, round and reactive. Extra occular movements are intact. Conjunctivae clear. Mucous membranes are dry.  Neck: Neck supple. No JVD. No carotid bruit. No cervical or supraclavicular lymphadenopathy.  Cardiovascular: Regular rate and rhythm, normal S1S2, no murmurs/rubs/gallops appreciated.  Pulmonary: Normal work of breathing. Chest clear to auscultation bilaterally, no wheezes/rales/rhonchi.  Abdomen: Normoactive bowel sounds. Soft, non-tender/non-distended. No organomegaly.  Extremities:  Right lower: Areas of hypopigmentation. +1 LE edema up to calf without erythema or warmth. Pulses 2+ Left lower: Areas of hypopigmentation. +1-2 LE edema up to thigh without erythema or warmth. Pulses 2+.  Skin: Skin color, texture, turgor normal. No rashes or lesions    Lab Results: BMET    Component Value Date/Time   NA 139 06/12/2013 0530   K 3.9 06/12/2013 0530   CL 103 06/12/2013 0530   CO2 24 06/12/2013 0530   GLUCOSE 96 06/12/2013 0530   BUN 20 06/12/2013 0530   CREATININE 1.10 06/12/2013 0530   CREATININE 0.94 04/24/2013 1648   CALCIUM 9.9 06/12/2013 0530   GFRNONAA 59* 06/12/2013 0530   GFRNONAA 73 04/24/2013 1648   GFRAA 68* 06/12/2013 0530   GFRAA 85 04/24/2013 1648    CBC    Component Value Date/Time   WBC 5.7 06/12/2013 0530   RBC 3.16* 06/12/2013 0530   HGB 9.5* 06/12/2013 0530   HCT 29.1* 06/12/2013 0530   PLT 194 06/12/2013 0530   MCV 92.1 06/12/2013 0530   MCH 30.1 06/12/2013 0530   MCHC 32.6 06/12/2013 0530   RDW 14.4 06/12/2013 0530   LYMPHSABS 1.4 06/10/2013 1437   MONOABS 0.5 06/10/2013 1437   EOSABS 0.0 06/10/2013 1437   BASOSABS 0.0 06/10/2013 1437    Medications: I have reviewed the patient's current medications. Scheduled Meds: . atorvastatin  20 mg Oral q1800  . benazepril  20 mg Oral q morning - 10a  . cholecalciferol  1,000 Units Oral Daily  . clopidogrel  75 mg Oral Q breakfast  . enoxaparin (LOVENOX) injection  80 mg Subcutaneous Q12H  . metoprolol tartrate  25 mg Oral BID  . omega-3 acid ethyl esters  1 g Oral Daily  . patient's guide to using coumadin book   Does not apply Once  . sodium chloride  3 mL Intravenous Q12H  . warfarin  7.5 mg Oral ONCE-1800  . warfarin   Does not apply Once  . Warfarin -  Pharmacist Dosing Inpatient   Does not apply q1800   Continuous Infusions:  PRN Meds:.  Assessment/Plan:  **Acute LLE DVT - Patient presented with acute left LE swelling and pain s/p a TURP procedure (05/19/13). On presentation pain and swelling had progressed to the hip.  Bilateral duplexes were obtained revealing left lower extremity DVT of the saphenofemoral junction, common femoral, profunda, femoral, popliteal, posterior tibial, and peroneal vein.  Duplex also revealed superficial thrombosis in the left greater saphenous vein.  Major risk factor for DVT is the recent surgical procedure.  Patient has no prior history of DVT/PE or history of recent falls, injury, or trauma. No acute onset dyspnea, hypoxia, tachypnea, or tacycardia to suggest PE. Continue to  monitor. -Started on Lovenox and Compression Stockings (4/1). Bridged to Coumadin (4/2). -12-lead EKG showed normal sinus rhythm  ** Chronic Normocytic Anemia - Patient presented with hemoglobin near baseline of 12. In the setting of hematuria and anticoagulation therapy, hemoglobin dropped to 9.6 (4/1). H&H continually monitored and it has remained stable, patient has remained asymptomatic. -Monitor for bleeding with transition to Coumadin (4/2) -Follow serial H&H   ** Frequent falls in setting of Cerebellar Stroke - Patient had a CVA in 2013 which led to frequent falls and the requirement of walker use. He is currently at baseline neurological status. -Obtained PT/OT consult   ** Hypertension - Hypertensive on presentation, currently normotensive. Continue to monitor.  -Continued home benazepril 20 mg daily and home metoprolol tartrate 25 mg BID   ** Chronic Hypercalcemia - Patient with chronic elevated calcium levels and PTH on 04/24/13 that was high-normal (47.3), most likely due to primary hyperparathyroidism. Patient also with history of kidney stones. He takes cholecalciferol 1000 U daily at home. Calcium normalized to 9.9 during hospitalization.   - PTHrP level obtained to rule out hypercalcemia due to malignancy.   - Obtained albumin to calculate corrected calcium - Obtained 25-OH Vitamin D and 1,25 OH-Vitamin D levels  - Consider UPEP, SPEP if other tests negative or work this up as an outpatient  ** Chronic Kidney Disease Stage 2 - CKD stable at presentation. Creatinine near baseline of 1.0 with normal urine output.  -Monitor renal function and volume status.   ** Hyperlipidemia - Last lipid panel on 07/20/11 (normal).  -Continued home atorvastatin 20 mg daily    Diet: Heart  Code: Full   Dispo: Social Work is handling possibility of SNF placement at discharge. Patient will see PCP Joni Reining, DO) for follow-up appointment. Patient does have transportation limitations that  hinder transportation to clinic appointments.    This is a Careers information officer Note.  The care of the patient was discussed with Dr. Eulas Post and the assessment and plan formulated with their assistance.  Please see their attached note for official documentation of the daily encounter.   LOS: 2 days   Isac Sarna, Med Student 06/12/2013, 11:50 AM

## 2013-06-12 NOTE — Progress Notes (Signed)
   I have seen the patient and reviewed the daily progress note by Bess Harvest, MS-4 and discussed the care of the patient with him.  Please see my separate note from 06/11/13 for documentation of my findings, assessment, and plans.    LOS: 1 days   Otho Bellows, MD 06/12/2013, 7:17 AM

## 2013-06-12 NOTE — Discharge Summary (Signed)
Name: [name] MRN: [id number] DOB: 1924-09-06 80 y.o. PCP: Joni Reining, DO  Date of Admission: 06/10/2013  1:49 PM Date of Discharge: 06/20/2013 Attending Physician: Axel Filler, MD  Discharge Diagnosis: Principal Problem:   Leg DVT (deep venous thromboembolism), acute Active Problems:   HTN (hypertension)   Hearing loss   Normocytic anemia   Hypercalcemia   Hematuria, gross   Urinary retention  Discharge Medications:   Medication List    STOP taking these medications       sulfamethoxazole-trimethoprim 800-160 MG per tablet  Commonly known as:  BACTRIM DS     traMADol-acetaminophen 37.5-325 MG per tablet  Commonly known as:  ULTRACET      TAKE these medications       atorvastatin 20 MG tablet  Commonly known as:  LIPITOR  Take 20 mg by mouth daily at 6 PM.     benazepril 20 MG tablet  Commonly known as:  LOTENSIN  Take 20 mg by mouth every morning.     clopidogrel 75 MG tablet  Commonly known as:  PLAVIX  Take 75 mg by mouth daily with breakfast.     GERITOL PO  Take 1 tablet by mouth daily.     metoprolol tartrate 25 MG tablet  Commonly known as:  LOPRESSOR  Take 25 mg by mouth 2 (two) times daily.     omega-3 acid ethyl esters 1 G capsule  Commonly known as:  LOVAZA  Take 1 g by mouth daily.     VITAMIN D PO  Take 1 tablet by mouth daily.        Disposition and follow-up:   [name] was discharged from [facility] in Stable condition.  At the hospital follow up visit please address:  1. Hematuria: Please ensure the continued remittance of patient's hematuria. Anticoagulation with Lovenox and Coumadin can likely be resumed in 1 week, but he will need to be monitored for continued bleeding after it is restarted.  As the IVC filter is not permanently placed, and if he has no further hematuria a few weeks after anticoagulation is restarted, consider removal of the IVC filter.          DVT: Monitor for the  improvement in his LLE pain and edema (no edema at discharge)       Hypercalcemia: Pt with polyclonal gammopathy, which can be seen with inflammation and infection, but also in HIV. This will need to be further evaluated with serum IPE.  2.  Labs / imaging needed at time of follow-up: CBC to assess hemoglobin, consider checking serum IPE for further evaluation of his hypercalcemia. Consider checking HIV.   3.  Pending labs/ test needing follow-up: None   Follow-up Appointments: Follow-up Information   Follow up with [name], DO In 2 weeks.   Specialty:  Internal Medicine   Contact information:   [place] [address] [phone]       Follow up with Carolan Clines I, MD. Dennis Bast will be contacted with an appointment)    Specialty:  Urology   Contact information:   [facility]  [place] [address] [phone]      Discharge Orders   Future Appointments Provider Department Dept Phone   07/03/2013 3:30 PM Joni Reining, [place] [facility] [phone]   Future Orders Complete By Expires   Call MD for:  difficulty breathing, headache or visual  disturbances  As directed    Call MD for:  redness, tenderness, or signs of infection (pain, swelling, redness, odor or green/yellow discharge around incision site)  As directed    Call MD for:  severe uncontrolled pain  As directed    Call MD for:  temperature >100.4  As directed    Diet - low sodium heart healthy  As directed    Discharge instructions  As directed    Walk with assistance  As directed       Consultations: Treatment Team:  Ardis Hughs, MD  Procedures Performed:  No results found.  Admission  HPI:  [name] is a 80 year old man with past medical history of hypertension, BPH s/p TURP on 05/19/13, cerebellar stroke in 2013, CKD Stage II, and hyperlipidemia who presents with left leg pain and swelling since his TURP  procedure.  History obtained from daughter as patient is very hard of hearing. Per daughter pt had TURP procedure due to BPH on March 9 with no complications. Pt then began having left ankle swelling a few days after surgery that then extended up his lower leg and now up to his thigh with worsening pain and tenderness. Pt has frequent falls s/p cerebellar stroke in 2013 and recently with no injuries or trauma to his left leg. There is no warmth or erythema. He has been working with home PT who noticed his swelling was worsening and has been unable to bear weight on his leg (previously was using walker to ambulate). He denies dyspnea, chest pain, or palpitations. He has never had prior blood clot before. He has been on plavix daily after the stroke.  He was seen in follow-up with his urologist with improvement of his BPH symptoms and mild persistence of hematuria. He also endorses pain on the right side of his flank/back and perirectal area. He has no recent weight loss but per daughter he has had decreased PO intake since his surgery.  Review of Systems:  Review of Systems  Constitutional: Positive for malaise/fatigue. Negative for fever, chills, weight loss and diaphoresis.  HENT: Negative for congestion and sore throat.  Hard of hearing  Eyes: Negative for blurred vision.  Respiratory: Negative for cough and shortness of breath.  Cardiovascular: Positive for leg swelling. Negative for chest pain and palpitations.  Gastrointestinal: Negative for nausea, vomiting, abdominal pain, diarrhea and constipation.  Genitourinary: Positive for hematuria and flank pain (right sided).  Urinary incontinence  Musculoskeletal: Positive for falls.  Skin: Negative for rash.  Neurological: Positive for weakness. Negative for dizziness, sensory change, speech change, focal weakness and headaches.   Physical Exam:  Blood pressure 149/93, pulse 76, temperature 97.9 F (36.6 C), temperature source Oral, resp. rate  14, SpO2 100.00%.  Physical Exam  Constitutional: He is oriented to person, place, and time. No distress.  Thin appearing  HENT:  Head: Normocephalic and atraumatic.  Right Ear: External ear normal.  Left Ear: External ear normal.  Nose: Nose normal.  Mouth/Throat: Oropharynx is clear and moist. No oropharyngeal exudate.  Eyes: Conjunctivae and EOM are normal. Pupils are equal, round, and reactive to light. Right eye exhibits no discharge. Left eye exhibits no discharge. No scleral icterus.  Neck: Normal range of motion. Neck supple.  Cardiovascular: Normal rate and regular rhythm.  Pulmonary/Chest: Effort normal and breath sounds normal.  Anterior lung fields clear to auscultation (posterior fields could not be examined due to pain with leaning forward)  Abdominal: Soft. Bowel sounds are normal. He  exhibits no distension. There is no tenderness. There is no rebound.  Musculoskeletal: He exhibits edema (+2 left LE edema up to thigh without erythema or warmth) and tenderness.  Normal PT and DP pulses b/l  Decreased ROM of b/l LE  Neurological: He is alert and oriented to person, place, and time. No cranial nerve deficit.  Skin: Skin is warm and dry. He is not diaphoretic.  Areas of hypopigmentation on b/l LE    Hospital Course by problem list:  80yo M w/ PMH HTN, BPH s/p TURP on 05/19/13, cerebellar stroke '13, CKD Stage II, and HLD who presented with acute LLE edema and pain and was found to have large clot burden in her LLE venous system and subsequently developed persistent gross hematuria with anticoagulation, requiring IVC filter placement and cessation of anticoagulation.  #Acute LLE DVT: Pt s/p TURPs on 05/19/13 for BPH. He was brought to the ED on 3/31 with significant edema and pain. BLE dopplers were ordered and reveled a large clot burden throughout the left leg involving the saphenofemoral junction, common femoral, profunda, femoral, popliteal, posterior tibial, and peroneal vein.  Of note, he has had excellent DP pulses throughout this admission. Lovenox and compression stockings were started on 06/11/13 and Coumadin was started 4/2. The edema and pain resolved; unfortunately he developed hematuria requiring cessation of all anticoagulation and antiplatelet medication and the placement of an IVF filter on 4/6. Per Urology anticoagulation can likely be resumed in 1 week if he does not have any further hematuria. Ensure no hematuria post-discharge before restarting anticoagulation.  #Gross hematuria: Pt with no gross hematuria at the time of admission until Lovenox started for DVT, likely bleeding from the prostate given his recent TURPs. Urology was consulted and placed a urinary catheter and started continuous bladder irrigation. Due to persistent hematuria even with the catheter and CBI, Urology recommended IVC filter and discontinuing anticoagulation. His home Plavix and the Lovenox and Coumadin were held. On 4/8 his hematuria looked to be improving, CBI was temporarily discontinued. The following morning, his urine output tubing was brownish in color, consistent with old blood, and the catheter was removed by Urology on 4/9. Since that time, his uop has been good and the urine has been clear yellow in appearance. Urology is to schedule outpatient follow up with the patient's Urologist, Dr. Gaynelle Arabian. After approximately 1 week with no hematuria, his anticoagulation can be restarted.    #Acute on Chronic Normocytic Anemia: Baseline Hgb around 12. H/o TURPs 3/9 with hematuria that has resolved per the daughter. No gross hematuria on admission with Hgb 11.6, 12.2 prior to procedure. Lovenox/Coumadin started on admission and Hgb dropped below 7.5, requiring transfusion of a total of 2u pRBCs this admission. Improved to 8.2 and remains stable. VSS.  Recent Labs Lab 06/18/13 0700 06/18/13 1957 06/18/13 2343 06/19/13 1114 06/20/13 0423  HGB 7.3* 8.2* 8.2* 8.9* 8.2*    Chronic  hypercalcemia: Ca this admission elevated to 11.4 with previously elevated levels. PTH normal in Feb,and Ca normalized to 9.9. Checked Vit D levels, and PTH-related protein ordered to help determine if his hypercalcemia could be related to malignancy. - Vit D levels and PTH-related peptide normal. Checking SPEP and UPEP to r/o possible malignancy. UPEP shows no monoclonal free light chains but did have elevated kappa light chains and kappa/lambda ratio. Normal urine protein level. SPEP was uniformly elevated, except for alpha-2-globin with no M-spike, representing a polyclonal gamopathy which primarily occurs in the setting of infection and inflammation but  can occur in HIV. Consider checking serum IPE for further evaluation as an outpatient.   #Frequent falls in setting of Cerebellar Stroke: Patient had a CVA in 2013 which led to frequent falls and the requirement of walker use. He is currently at baseline neurological status. PT/OT recommended SNF placement after discharge.  - D/c to SNF  #Hypertension: H/o HTN. Hypertensive on admission, which has resolved. BP well controlled on home lopressor and benazepril  - Lopressor 25mg  BID  - Benazepril 20mg  daily   #Chronic Kidney Disease Stage 2: CKD stable at presentation. Creatinine near baseline of 1.0 with normal urine output. Renal function and volume status was monitored. Stable on discharge.  Recent Labs Lab 06/14/13 0502 06/18/13 0700  CREATININE 1.06 1.11    #Hyperlipidemia: Stable. Last lipid panel normal on 07/20/11.  - Continuing home atorvastatin 20 mg daily     Discharge Vitals:   BP 124/70  Pulse 69  Temp(Src) 98 F (36.7 C) (Oral)  Resp 18  Ht 5\' 11"  (1.803 m)  Wt 164 lb 3.9 oz (74.5 kg)  BMI 22.92 kg/m2  SpO2 99%  Discharge Labs:  Results for orders placed during the hospital encounter of 06/10/13 (from the past 24 hour(s))  PROTIME-INR     Status: None   Collection Time    06/20/13  4:23 AM      Result Value Ref  Range   Prothrombin Time 14.7  11.6 - 15.2 seconds   INR 1.17  0.00 - 1.49  CBC     Status: Abnormal   Collection Time    06/20/13  4:23 AM      Result Value Ref Range   WBC 5.7  4.0 - 10.5 K/uL   RBC 2.67 (*) 4.22 - 5.81 MIL/uL   Hemoglobin 8.2 (*) 13.0 - 17.0 g/dL   HCT 24.6 (*) 39.0 - 52.0 %   MCV 92.1  78.0 - 100.0 fL   MCH 30.7  26.0 - 34.0 pg   MCHC 33.3  30.0 - 36.0 g/dL   RDW 14.7  11.5 - 15.5 %   Platelets 181  150 - 400 K/uL    Signed: Otho Bellows, MD 06/20/2013, 3:22 PM   Time Spent on Discharge: 35 minutes Services Ordered on Discharge: SNF with PT/OT Equipment Ordered on Discharge: N/A

## 2013-06-12 NOTE — Progress Notes (Signed)
Internal Medicine Attending  Date: 06/12/2013  Patient name: [name] Medical record number: [id number] Date of birth: Jul 15, 1924 Age: 80 y.o. Gender: male  I saw and evaluated the patient, and discussed his care on A.M rounds with housestaff.  I reviewed the resident's note by Dr. Eulas Post and I agree with the resident's findings and plans as documented in her note.

## 2013-06-12 NOTE — Progress Notes (Signed)
PT Cancellation Note  Patient Details Name: [name] MRN: [id number] DOB: May 21, 1924   Cancelled Treatment:    Reason Eval Not Completed: Medical issues which prohibited therapy--pt on strict bedrest. Paged [phone] (listed on chart as first contact) for updated activity orders with no return call. Will await orders and complete eval as appropriate.   [name] 06/12/2013, 7:52 AM Pager [phone]

## 2013-06-12 NOTE — Progress Notes (Signed)
ANTICOAGULATION CONSULT NOTE - Follow Up Consult  Pharmacy Consult for LMWH and coumadin Indication: DVT  No Known Allergies  Patient Measurements: Height: 5\' 11"  (180.3 cm) Weight: 159 lb 13.3 oz (72.5 kg) IBW/kg (Calculated) : 75.3  Vital Signs: Temp: 98.4 F (36.9 C) (04/02 0515) Temp src: Oral (04/02 0515) BP: 125/77 mmHg (04/02 0515) Pulse Rate: 64 (04/02 0515)  Labs:  Recent Labs  06/10/13 1437 06/11/13 0315 06/11/13 1050 06/12/13 0530  HGB 11.6* 9.6* 9.9* 9.5*  HCT 35.1* 29.5* 30.1* 29.1*  PLT 181 180 181 194  APTT 33  --   --   --   LABPROT 14.1  --   --   --   INR 1.11  --   --   --   CREATININE 1.13 1.12  --  1.10    Estimated Creatinine Clearance: 49.4 ml/min (by C-G formula based on Cr of 1.1).  Assessment:  80 yo M on LMWH for DVT to start coumadin today for acute LLE DVT.  Wt 72.5 kg; creat 1.1; Hg/Hct 9.5/29.1; pltc 194K.  Baseline INR 1.11 Today will be day # 1/5 of minimum overlap required for VTE core measure.  Coumadin score = 7.     Goal of Therapy:  INR 2-3 Anti-Xa level 0.6-1 units/ml 4hrs after LMWH dose given Monitor platelets by anticoagulation protocol: Yes   Plan:  1.  Continues on LMWH 80 sq q12h, could change to 110 mg sq q24 if desired for convenience at discharge.  2. Coumadin 7.5 mg po x 1 dose today 3. Coumadin video and book for education 4. Daily INR Eudelia Bunch, Pharm.D. [phone] 06/12/2013 11:07 AM

## 2013-06-12 NOTE — Progress Notes (Signed)
Clinical Social Work Department BRIEF PSYCHOSOCIAL ASSESSMENT 06/12/2013  Patient:  QUINTON, VOTH     Account Number:  [id number]     Admit date:  06/10/2013  Clinical Social Worker:  Megan Salon  Date/Time:  06/12/2013 02:26 PM  Referred by:  Physician  Date Referred:  06/11/2013 Referred for  SNF Placement   Other Referral:   Interview type:  Family Other interview type:   CSW spoke to patient's daughter over the phone    PSYCHOSOCIAL DATA Living Status:  FAMILY Admitted from facility:   Level of care:   Primary support name:  [name] Rembert Primary support relationship to patient:  CHILD, ADULT Degree of support available:   Good    CURRENT CONCERNS Current Concerns  Post-Acute Placement   Other Concerns:    SOCIAL WORK ASSESSMENT / PLAN Clinical Social Worker received referral for SNF placement at d/c. CSW reviewed chart and noticed patient has times of confusion. Per RN, Education officer, museum can call patient's daughter. CSW called patient's daughter and introduced self and explained reason for call. CSW explained SNF process to patient's daughter. Daughter states that she is only familiar with [facility], but would be open to other facilities. CSW explained that social worker will fax out patient's information to [facility] and then provide patient's daughter with bed offers tomorrow morning, so she can look into each of the bed offfers.     CSW will complete FL2 for MD's signature and will update patient and family when bed offers are received.   Assessment/plan status:  Psychosocial Support/Ongoing Assessment of Needs Other assessment/ plan:   Information/referral to community resources:   SNF information/CSW contact information    PATIENT'S/FAMILY'S RESPONSE TO PLAN OF CARE: Patient's daughter states she would like patient to go to SNF.       Jeanette Caprice, MSW, [place]

## 2013-06-12 NOTE — Progress Notes (Signed)
Pt noted to be putting out dark red urine with small red clots.  MD on call notified and to bedside to assess. Pt resting in bed with call bell in reach. Will continue to monitor pt and urine output closely.

## 2013-06-12 NOTE — Progress Notes (Signed)
Clinical Social Work Department CLINICAL SOCIAL WORK PLACEMENT NOTE 06/12/2013  Patient:  JEOFFREY, ELEAZER  Account Number:  [id number] Admit date:  06/10/2013  Clinical Social Worker:  Megan Salon  Date/time:  06/12/2013 02:29 PM  Clinical Social Work is seeking post-discharge placement for this patient at the following level of care:   [place]   (*CSW will update this form in Epic as items are completed)   06/12/2013  Patient/family provided with [facility] Department of Clinical Social Work's list of facilities offering this level of care within the geographic area requested by the patient (or if unable, by the patient's family).  06/12/2013  Patient/family informed of their freedom to choose among providers that offer the needed level of care, that participate in Medicare, Medicaid or managed care program needed by the patient, have an available bed and are willing to accept the patient.  06/12/2013  Patient/family informed of [facility]' ownership interest in [facility], as well as of the fact that they are under no obligation to receive care at this facility.  PASARR submitted to EDS on 06/12/2013 PASARR number received from EDS on 06/12/2013  FL2 transmitted to all facilities in geographic area requested by pt/family on  06/12/2013 FL2 transmitted to all facilities within larger geographic area on   Patient informed that his/her managed care company has contracts with or will negotiate with  certain facilities, including the following:     Patient/family informed of bed offers received:   Patient chooses bed at  Physician recommends and patient chooses bed at    Patient to be transferred to  on   Patient to be transferred to facility by   The following physician request were entered in Epic:   Additional Comments:  Jeanette Caprice, MSW, [place]

## 2013-06-12 NOTE — Progress Notes (Signed)
OT Cancellation Note  Patient Details Name: [name] MRN: [id number] DOB: 10/13/1924   Cancelled Treatment:    Reason Eval/Treat Not Completed: Other (comment) Pt/daughter current D/C plan is SNF and this what PT is recommending. No apparent immediate acute care OT needs, therefore will defer OT to SNF. If OT eval is needed please call Acute Rehab Dept. at [phone] or text page OT at [phone].     Almon Register [phone] 06/12/2013, 1:34 PM

## 2013-06-12 NOTE — Progress Notes (Signed)
Subjective: Pt doing well this morning. Since starting the heparin his LLE pain continues to improve; edema is remarkably improved. Gross hematuria in condom cath earlier this morning, significantly improved on team rounds late this morning.    Objective: Vital signs in last 24 hours: Filed Vitals:   06/11/13 1109 06/11/13 1330 06/11/13 2023 06/12/13 0515  BP: 122/74 119/62 130/72 125/77  Pulse: 75 66 70 64  Temp:  98.3 F (36.8 C) 98.5 F (36.9 C) 98.4 F (36.9 C)  TempSrc:  Oral Oral Oral  Resp:  17 18 18   Height:      Weight:      SpO2:  97% 95% 93%   Weight change:   Intake/Output Summary (Last 24 hours) at 06/12/13 1153 Last data filed at 06/12/13 0800  Gross per 24 hour  Intake    360 ml  Output   1301 ml  Net   -941 ml   Vitals reviewed. General: Sitting up in bed eating lunch, NAD HEENT: PERRL, EOMI Cardiac: RRR, no rubs, murmurs or gallops Pulm: Clear to auscultation bilaterally, no wheezes, rales, or rhonchi Abd: Soft, nontender, nondistended Ext: Warm and well perfused. LLE with 1-2+ pitting edema, no erythema. TED hose in place bilaterally. Decreased pain in the with strong DP pulses in BLE. Moves all 4 extremities. Neuro: Alert and oriented, cranial nerves II-XII grossly intact, non focal   Lab Results: Basic Metabolic Panel:  Recent Labs Lab 06/10/13 1437 06/11/13 0315 06/12/13 0530  NA 143 141 139  K 4.7 4.3 3.9  CL 106 105 103  CO2 26 25 24   GLUCOSE 113* 107* 96  BUN 23 24* 20  CREATININE 1.13 1.12 1.10  CALCIUM 11.4* 10.9* 9.9  MG 2.0  --   --    CBC:  Recent Labs Lab 06/10/13 1437  06/11/13 1050 06/12/13 0530  WBC 7.2  < > 6.6 5.7  NEUTROABS 5.3  --   --   --   HGB 11.6*  < > 9.9* 9.5*  HCT 35.1*  < > 30.1* 29.1*  MCV 92.4  < > 91.8 92.1  PLT 181  < > 181 194  < > = values in this interval not displayed. Coagulation:  Recent Labs Lab 06/10/13 1437  LABPROT 14.1  INR 1.11   Urinalysis:  Recent Labs Lab 06/11/13 0323   COLORURINE AMBER*  LABSPEC 1.015  PHURINE 8.0  GLUCOSEU NEGATIVE  HGBUR LARGE*  BILIRUBINUR NEGATIVE  KETONESUR NEGATIVE  PROTEINUR 30*  UROBILINOGEN 1.0  NITRITE NEGATIVE  LEUKOCYTESUR LARGE*    Micro Results: No results found for this or any previous visit (from the past 240 hour(s)).  Studies/Results: No results found.  Medications: I have reviewed the patient's current medications. Scheduled Meds: . atorvastatin  20 mg Oral q1800  . benazepril  20 mg Oral q morning - 10a  . cholecalciferol  1,000 Units Oral Daily  . clopidogrel  75 mg Oral Q breakfast  . enoxaparin (LOVENOX) injection  80 mg Subcutaneous Q12H  . metoprolol tartrate  25 mg Oral BID  . omega-3 acid ethyl esters  1 g Oral Daily  . patient's guide to using coumadin book   Does not apply Once  . sodium chloride  3 mL Intravenous Q12H  . warfarin  7.5 mg Oral ONCE-1800  . warfarin   Does not apply Once  . Warfarin - Pharmacist Dosing Inpatient   Does not apply q1800   Continuous Infusions:  PRN Meds:.  Assessment/Plan: 80yo M  w/ PMH HTN, BPH s/p TURP on 05/19/13, cerebellar stroke '13, CKD Stage II, and HLD who presented with acute LLE edema and pain and was found to have large clot burden in her LLE venous system.   #Acute LLE DVT: Pt s/p TURPs on 05/19/13 for BPH. Since his procedure, per his daughter, he was having progressive LLE edema and increasing pain with motion of the LLE. He was brought to the ED on 3/31 2/2 to this worsening edema and pain. BLE dopplers were ordered and reveled a large clot burden throughout the left leg. Lovenox was started on admission and has been continued and starting Coumadin today, as other po anticoagulation will be significantly more costly for the pt- thanks to CM  For looking into this. He is going to a SNF at d/c, and will be in a controlled environment so falls are less of a concern on anticoagulation.  - Continue Lovenox as bridge to Coumadin - Coumadin per Pharmacy -  Compression stockings   #Hematuria: Pt with no gross hematuria on admission, and Lovenox started on admission for DVT. Unfortunatley gross hematuria present on exam this morning, which did improve significantly by the time he was examined on team rounds. Hgb stable today from yesterday. Continuing anticoagulation for DVT. Will continue to monitor and trend H/H.  - CBC q12h - Urology contacted 4/1 but will touch base with them if hematuria persists.   #Anemia, normocytic: H/o TURPs 3/9 with hematuria that has resolved per the daughter. No hematuria this admission. Hgb 11.6 on admission, previously 12.2 prior to procedure. Lovenox started on admission and Hgb 9.6 and is stable today. Continuing the Lovenox and starting Coumadin.  Recent Labs Lab 06/10/13 1437 06/11/13 0315 06/11/13 1050 06/12/13 0530  HGB 11.6* 9.6* 9.9* 9.5*   - q12h CBC  #Hypercalcemia: Resolved. Ca this admission elevated to 11.4 with previously elevated levels. PTH normal in Feb,and Ca normalized today to 9.9. Will check Vit D levels, and will check PTH-relate protein today to help determine if his hypercalcemia could be related to malignancy. If this is normal and Ca remains normal, he will need to have his calcium monitored as an out patient and will need SPEP and UPEP checked. - Checking PTH- related protein  - Checking Vit D levels - am BMP  #HTN: BP well controlled on home lopressor and benazepril  - Lopressor 25mg  BID - Benazepril 20mg  daily   PLEASE REFER TO MS-4 NOTE FOR THE REMAINDER OF THE PLAN   Dispo: Disposition is deferred at this time, awaiting improvement of current medical problems.  Anticipated discharge in approximately 1 day(s) to SNF.   The patient does have a current PCP Joni Reining, DO) and does need an [facility] hospital follow-up appointment after discharge.  The patient does not have transportation limitations that hinder transportation to clinic appointments.  .Services Needed at time of  discharge: Y = Yes, Blank = No PT:   OT:   RN:   Equipment:   Other:     LOS: 2 days   Otho Bellows, MD 06/12/2013, 11:53 AM

## 2013-06-13 ENCOUNTER — Encounter (HOSPITAL_COMMUNITY): Payer: Self-pay | Admitting: Internal Medicine

## 2013-06-13 ENCOUNTER — Encounter (HOSPITAL_COMMUNITY)
Admission: EM | Disposition: A | Discharge status: Skilled Nursing Facility | Payer: Self-pay | Source: Home / Self Care | Attending: Internal Medicine

## 2013-06-13 DIAGNOSIS — R310 Gross hematuria: Secondary | ICD-10-CM

## 2013-06-13 HISTORY — PX: CYSTOSCOPY: SHX5120

## 2013-06-13 LAB — CBC
HCT: 28.9 % — ABNORMAL LOW (ref 39.0–52.0)
HCT: 28 % — ABNORMAL LOW (ref 39.0–52.0)
Hemoglobin: 9.6 g/dL — ABNORMAL LOW (ref 13.0–17.0)
Hemoglobin: 9.1 g/dL — ABNORMAL LOW (ref 13.0–17.0)
MCH: 30.2 pg (ref 26.0–34.0)
MCH: 29.6 pg (ref 26.0–34.0)
MCHC: 33.2 g/dL (ref 30.0–36.0)
MCHC: 32.5 g/dL (ref 30.0–36.0)
MCV: 90.9 fL (ref 78.0–100.0)
MCV: 91.2 fL (ref 78.0–100.0)
Platelets: 211 10*3/uL (ref 150–400)
Platelets: 209 10*3/uL (ref 150–400)
RBC: 3.18 MIL/uL — ABNORMAL LOW (ref 4.22–5.81)
RBC: 3.07 MIL/uL — ABNORMAL LOW (ref 4.22–5.81)
RDW: 14 % (ref 11.5–15.5)
RDW: 14.1 % (ref 11.5–15.5)
WBC: 5.5 10*3/uL (ref 4.0–10.5)
WBC: 4.9 10*3/uL (ref 4.0–10.5)

## 2013-06-13 LAB — PROTIME-INR
INR: 1.23 (ref 0.00–1.49)
Prothrombin Time: 15.2 seconds (ref 11.6–15.2)

## 2013-06-13 LAB — VITAMIN D 25 HYDROXY (VIT D DEFICIENCY, FRACTURES): Vit D, 25-Hydroxy: 71 ng/mL (ref 30–89)

## 2013-06-13 LAB — ALBUMIN: Albumin: 2 g/dL — ABNORMAL LOW (ref 3.5–5.2)

## 2013-06-13 SURGERY — CYSTOSCOPY, FLEXIBLE
Anesthesia: Choice

## 2013-06-13 MED ORDER — SODIUM CHLORIDE 0.9 % IR SOLN
Status: DC | PRN
  Administered 2013-06-13: 3000 mL via INTRAVESICAL

## 2013-06-13 MED ORDER — LIDOCAINE HCL 2 % EX GEL
CUTANEOUS | Status: DC | PRN
  Administered 2013-06-13: 1

## 2013-06-13 MED ORDER — LIDOCAINE HCL 2 % EX GEL
CUTANEOUS | Status: AC
  Filled 2013-06-13: qty 20

## 2013-06-13 MED ORDER — WARFARIN SODIUM 5 MG PO TABS
5.0000 mg | ORAL_TABLET | Freq: Once | ORAL | Status: AC
  Administered 2013-06-13: 5 mg via ORAL
  Filled 2013-06-13: qty 1

## 2013-06-13 SURGICAL SUPPLY — 12 items
BAG URINE DRAINAGE (UROLOGICAL SUPPLIES) ×3 IMPLANT
CATH HEMA 3WAY 30CC 24FR COUDE (CATHETERS) ×1 IMPLANT
GLOVE BIO SURGEON STRL SZ7 (GLOVE) ×1 IMPLANT
GLOVE BIO SURGEON STRL SZ7.5 (GLOVE) ×1 IMPLANT
IV NS IRRIG 3000ML ARTHROMATIC (IV SOLUTION) ×1 IMPLANT
NS IRRIG 1000ML POUR BTL (IV SOLUTION) ×2 IMPLANT
SET IRRIG Y TYPE TUR BLADDER L (SET/KITS/TRAYS/PACK) ×1 IMPLANT
SPONGE GAUZE 4X4 12PLY (GAUZE/BANDAGES/DRESSINGS) ×1 IMPLANT
SYR 20CC LL (SYRINGE) ×1 IMPLANT
TOWEL OR 17X24 6PK STRL BLUE (TOWEL DISPOSABLE) ×1 IMPLANT
TRAY IRRIG W/60CC SYR STRL (SET/KITS/TRAYS/PACK) ×1 IMPLANT
WATER STERILE IRR 1000ML POUR (IV SOLUTION) ×2 IMPLANT

## 2013-06-13 NOTE — Progress Notes (Signed)
CBI at slow to moderate rate with red to pink urine and occasional clots, easily cleared.

## 2013-06-13 NOTE — Procedures (Signed)
Pre-procedure diagnosis: difficult foley catheterization Post-procedure diagnosis: as above  Procedure performed: placement of complicated foley  Surgeon: Dr. Ardis Hughs  Findings: Gross hematuria Specimen: none  Drains: 8F 3-way foley catheter  Indications:  Patient developed gross hematuria after being started on a heparin gtt.  He had been managed with a condom catheter.  I was consulted to help manage his gross hematuria.  Procedure:  Gentials were prepped and draped in the routine sterile fashion.  10cc of 1% viscous lidocaine jelly was then injected into the patient's urethra.  A 8F 3-way coude tipped catheter was then gently passed in to the urethral.  Resistance was met at the prostate, but with gentle pressue the catheter slid through the prostate and into the bladder.  Dark bloody urine was returned.  I hand irrigated with 60cc on NS multiple times and no clots returned.  I then placed the patient on continuous bladder irrigation.   Patient tolerated the procedure well - no immediate issues.  Disposition: Wean CBI as able.  If his urine clears he should be given a voiding trial prior to discharge.

## 2013-06-13 NOTE — Progress Notes (Signed)
Subjective: Pt denies pain this morning. Edema is improved. Hematuria seemed to resolve late yesterday morning, but the night team was notified of gross hematuria overnight that has persisted this morning.   Objective: Vital signs in last 24 hours: Filed Vitals:   06/12/13 0515 06/12/13 1331 06/12/13 2050 06/13/13 0538  BP: 125/77 126/76 137/81 130/74  Pulse: 64 67 71 70  Temp: 98.4 F (36.9 C) 98.7 F (37.1 C) 98.4 F (36.9 C) 98.1 F (36.7 C)  TempSrc: Oral Oral Oral Oral  Resp: 18 18 18 16   Height:      Weight:    164 lb 3.9 oz (74.5 kg)  SpO2: 93% 96% 94% 94%   Weight change:   Intake/Output Summary (Last 24 hours) at 06/13/13 1130 Last data filed at 06/13/13 0540  Gross per 24 hour  Intake    240 ml  Output   1450 ml  Net  -1210 ml   Vitals reviewed. General: Sitting up in bed eating lunch, NAD HEENT: PERRL, EOMI Cardiac: RRR, no rubs, murmurs or gallops Pulm: Clear to auscultation bilaterally, no wheezes, rales, or rhonchi Abd: Soft, nontender, nondistended Ext: Warm and well perfused. LLE with 1+ pitting edema at the knee and 2+ at the lateral hip, none at the LE distal to the knee; no erythema. TED hose in place bilaterally. Decreased pain in the with strong DP pulses in BLE. Moves all 4 extremities. Neuro: Very hard of hearing. Alert and oriented to person, cranial nerves II-XII grossly intact, non focal   Lab Results: Basic Metabolic Panel:  Recent Labs Lab 06/10/13 1437 06/11/13 0315 06/12/13 0530  NA 143 141 139  K 4.7 4.3 3.9  CL 106 105 103  CO2 26 25 24   GLUCOSE 113* 107* 96  BUN 23 24* 20  CREATININE 1.13 1.12 1.10  CALCIUM 11.4* 10.9* 9.9  MG 2.0  --   --    CBC:  Recent Labs Lab 06/10/13 1437  06/13/13 0055 06/13/13 0430  WBC 7.2  < > 5.5 4.9  NEUTROABS 5.3  --   --   --   HGB 11.6*  < > 9.6* 9.1*  HCT 35.1*  < > 28.9* 28.0*  MCV 92.4  < > 90.9 91.2  PLT 181  < > 211 209  < > = values in this interval not  displayed. Coagulation:  Recent Labs Lab 06/10/13 1437 06/13/13 0430  LABPROT 14.1 15.2  INR 1.11 1.23   Urinalysis:  Recent Labs Lab 06/11/13 0323  COLORURINE AMBER*  LABSPEC 1.015  PHURINE 8.0  GLUCOSEU NEGATIVE  HGBUR LARGE*  BILIRUBINUR NEGATIVE  KETONESUR NEGATIVE  PROTEINUR 30*  UROBILINOGEN 1.0  NITRITE NEGATIVE  LEUKOCYTESUR LARGE*    Micro Results: No results found for this or any previous visit (from the past 240 hour(s)).  Studies/Results: No results found.  Medications: I have reviewed the patient's current medications. Scheduled Meds: . atorvastatin  20 mg Oral q1800  . benazepril  20 mg Oral q morning - 10a  . cholecalciferol  1,000 Units Oral Daily  . clopidogrel  75 mg Oral Q breakfast  . enoxaparin (LOVENOX) injection  80 mg Subcutaneous Q12H  . metoprolol tartrate  25 mg Oral BID  . omega-3 acid ethyl esters  1 g Oral Daily  . sodium chloride  3 mL Intravenous Q12H  . warfarin  5 mg Oral ONCE-1800  . warfarin   Does not apply Once  . Warfarin - Pharmacist Dosing Inpatient   Does  not apply q1800   Continuous Infusions:  PRN Meds:.  Assessment/Plan: 80yo M w/ PMH HTN, BPH s/p TURP on 05/19/13, cerebellar stroke '13, CKD Stage II, and HLD who presented with acute LLE edema and pain and was found to have large clot burden in her LLE venous system.   #Acute LLE DVT: Pt s/p TURPs on 05/19/13 for BPH. Since his procedure, per his daughter, he was having progressive LLE edema and increasing pain with motion of the LLE. He was brought to the ED on 3/31 2/2 to this worsening edema and pain. BLE dopplers were ordered and reveled a large clot burden throughout the left leg. Lovenox was started on admission and Coumadin was started 4/3, as other po anticoagulation will be significantly more costly for the pt- thanks to CM  For looking into this. He is going to a SNF at d/c, and will be in a controlled environment so falls are less of a concern on  anticoagulation.  - Continue Lovenox as bridge  - Coumadin per Pharmacy - Compression stockings   #Hematuria: Pt with no gross hematuria on admission. Lovenox started on admission for DVT. Unfortunatley gross hematuria began after starting the Lovenox. Hgb slowly downtrending. Continuing anticoagulation for DVT. Urology has been consulted given his recent TURPs and will see the pt today.  - CBC q12h - Urology consult, appreciate recommendations.   #Acute on chronic anemia, normocytic: Baseline Hgb around 12. H/o TURPs 3/9 with hematuria that has resolved per the daughter. No gross hematuria on admission. Hgb 11.6 this admission, 12.2 prior to procedure. Lovenox started on admission and Hgb dropped to 9.6 and is 9.1 today. Continuing the Lovenox and Coumadin. Likely bleeding from his prostate. Urology to see the pt and likely place a urethral catheter.   Recent Labs Lab 06/11/13 1050 06/12/13 0530 06/12/13 1939 06/13/13 0055 06/13/13 0430  HGB 9.9* 9.5* 9.9* 9.6* 9.1*   - q12h CBC  #Hypercalcemia: Ca this admission elevated to 11.4 with previously elevated levels. PTH normal in Feb,and Ca normalized to 9.9. Checking Vit D levels, and PTH-related protein ordered to help determine if his hypercalcemia could be related to malignancy. If this is normal and Ca remains normal, he will need to have his calcium monitored as an out patient and will need SPEP and UPEP checked. - Checking PTH- related protein  - Checking Vit D levels - am BMP  #HTN: Stable. BP well controlled on home lopressor and benazepril  - Lopressor 25mg  BID - Benazepril 20mg  daily  #Frequent falls in setting of Cerebellar Stroke: H/o CVA in 2013 which has led to frequent falls and the requirement of a walker. He is currently at baseline neurological status. PT/OT consulted in the hospital and rec SNF placement. - Continue PT  #Chronic Kidney Disease, Stage 2: Stable. Creatinine near baseline of 1.0 with good urine output.   - am BMP.   #Hyperlipidemia: Stable. Last lipid panel normal on 07/20/11.  - Continue home atorvastatin 20 mg daily    Dispo: Disposition is deferred at this time, awaiting improvement of current medical problems.  Will d/c to SNF once ready.   The patient does have a current PCP Joni Reining, DO) and does need an [facility] hospital follow-up appointment after discharge.  The patient does not have transportation limitations that hinder transportation to clinic appointments.  .Services Needed at time of discharge: Y = Yes, Blank = No PT:   OT:   RN:   Equipment:   Other:  LOS: 3 days   Otho Bellows, MD 06/13/2013, 11:30 AM

## 2013-06-13 NOTE — Progress Notes (Signed)
CSW provided bed offers to patient's daughter over the phone. Patient's daughter chose [facility] as her first choice. CSW will follow up with [facility] and continue to follow for dc.  Jeanette Caprice, MSW, [place]

## 2013-06-13 NOTE — Progress Notes (Signed)
Urologist here at 12:45. Three way foley cath inserted with NS irrigation at rate to keep urine pink. Pt tolerated procedure well.

## 2013-06-13 NOTE — Consult Note (Signed)
I have been asked to see the patient by Dr. Bertha Stakes, for evaluation and management of gross hematuria.  History of present illness: Is an 80 year old male was admitted to the hospital with a left lower extremity DVT. He is been placed on heparin drip and has developed gross hematuria. The patient has a history of BPH with lower urinary tract symptoms and is status post TURP by Dr. Gaynelle Arabian on 05/19/13. His voiding symptoms were significantly improved following the operation, he was still having slight hematuria up to to half weeks after his operation. However, following his operation he did note a significant functional decline. He then presented to the hospital and 06/10/13 and was found to have a lower extremity DVT. The patient is extremely hard of hearing and my encounter with him as limited by his ability to understand what I was saying.  Although the patient typically voids on his own independently, I did note that the patient had a condom catheter on with gross hematuria.  Review of systems: A 12 point comprehensive review of systems was obtained and is negative unless otherwise stated in the history of present illness.  Patient Active Problem List   Diagnosis Date Noted  . Hypercalcemia 06/13/2013  . Hematuria, gross 06/13/2013  . Normocytic anemia 06/11/2013  . Leg DVT (deep venous thromboembolism), acute 06/10/2013  . Physical deconditioning 05/30/2013  . Benign prostatic hypertrophy 05/19/2013  . BPH (benign prostatic hyperplasia) 04/24/2013  . Preoperative examination 04/24/2013  . Primary hyperparathyroidism 09/02/2012  . Mild anemia and thrombocytopenia 08/30/2012  . Cerumen impaction 08/30/2012  . Health care maintenance 08/30/2012  . Frequent falls 06/23/2012  . Thrombotic stroke involving right cerebellar artery 09/15/2011  . 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 Hearing loss 07/20/2011  . Meningioma 07/20/2011  . HTN (hypertension) 07/19/2011   . CVA (cerebral infarction) 07/19/2011    No current facility-administered medications on file prior to encounter.   Current Outpatient Prescriptions on File Prior to Encounter  Medication Sig Dispense Refill  . atorvastatin (LIPITOR) 20 MG tablet Take 20 mg by mouth daily at 6 PM.      . benazepril (LOTENSIN) 20 MG tablet Take 20 mg by mouth every morning.      . clopidogrel (PLAVIX) 75 MG tablet Take 75 mg by mouth daily with breakfast.      . metoprolol tartrate (LOPRESSOR) 25 MG tablet Take 25 mg by mouth 2 (two) times daily.      Marland Kitchen sulfamethoxazole-trimethoprim (BACTRIM DS) 800-160 MG per tablet Take 1 tablet by mouth 2 (two) times daily.  20 tablet  1    Past Medical History  Diagnosis Date  . Hypertension   . Difficulty hearing, right     BILATERAL HEARING LOSS - BEST TO TRY TO SPEAK INTO LEFT EAR  . Meningioma   . Frequent falls   . BPH (benign prostatic hypertrophy)     TURP 05/19/13  . Chronic kidney disease     CHRONIC KIDNEY DISEASE, 2  . Anemia   . Thrombocytopenia   . Stroke     Cerebellar, 2013; WALKS WITH WALKER, ABLE TO DRESS AND BATHE HIMSELF BUT FAMILY TRIES TO PROVIDE SUPERVISION BECAUSE OF HIS HX OF FALL AND WEAKNESS LEGS, ARMS   . Incontinence of urine     SOME INCONTINENCE    Past Surgical History  Procedure Laterality Date  . Tube put in ear yrs ago    . Transurethral resection of prostate N/A 05/19/2013  Procedure: TRANSURETHRAL RESECTION OF THE PROSTATE WITH GYRUS INSTRUMENTS;  Surgeon: Ailene Rud, MD;  Location: [facility];  Service: Urology;  Laterality: N/A;    History  Substance Use Topics  . Smoking status: Former Smoker    Types: Cigarettes  . Smokeless tobacco: Not on file  . Alcohol Use: No     Comment: QUIT SMOKING 50 YRS AGO    Family History  Problem Relation Age of Onset  . Diabetes Mother   . Heart disease Mother     PE: Filed Vitals:   06/12/13 2050 06/13/13 0538 06/13/13 1235 06/13/13 1236  BP: 137/81 130/74  118/70   Pulse: 71 70  91  Temp: 98.4 F (36.9 C) 98.1 F (36.7 C)    TempSrc: Oral Oral    Resp: 18 16    Height:      Weight:  74.5 kg (164 lb 3.9 oz)    SpO2: 94% 94%      Patient appears to be in no acute distress  patient is alert and oriented x3 Atraumatic normocephalic head No cervical or supraclavicular lymphadenopathy appreciated No increased work of breathing, no audible wheezes/rhonchi Regular sinus rhythm/rate Abdomen is soft, nontender, nondistended, no CVA or suprapubic tenderness Lower string is are symmetric without appreciable edema Grossly neurologically intact No identifiable skin lesions   Recent Labs  06/12/13 1939 06/13/13 0055 06/13/13 0430  WBC 5.6 5.5 4.9  HGB 9.9* 9.6* 9.1*  HCT 30.0* 28.9* 28.0*    Recent Labs  06/10/13 1437 06/11/13 0315 06/12/13 0530  NA 143 141 139  K 4.7 4.3 3.9  CL 106 105 103  CO2 26 25 24   GLUCOSE 113* 107* 96  BUN 23 24* 20  CREATININE 1.13 1.12 1.10  CALCIUM 11.4* 10.9* 9.9    Recent Labs  06/10/13 1437 06/13/13 0430  INR 1.11 1.23   No results found for this basename: LABURIN,  in the last 72 hours Results for orders placed during the hospital encounter of 04/17/13  CULTURE, BLOOD (ROUTINE X 2)     Status: None   Collection Time    04/17/13 11:30 AM      Result Value Ref Range Status   Specimen Description BLOOD RIGHT ANTECUBITAL   Final   Special Requests BOTTLES DRAWN AEROBIC AND ANAEROBIC 10ML   Final   Culture  Setup Time     Final   Value: 04/17/2013 16:29     Performed at [facility]   Culture     Final   Value: NO GROWTH 5 DAYS     Performed at [facility]   Report Status 04/23/2013 FINAL   Final  CULTURE, BLOOD (ROUTINE X 2)     Status: None   Collection Time    04/17/13 12:00 PM      Result Value Ref Range Status   Specimen Description BLOOD HAND LEFT   Final   Special Requests BOTTLES DRAWN AEROBIC AND ANAEROBIC 9ML   Final   Culture  Setup Time     Final    Value: 04/17/2013 16:29     Performed at [facility]   Culture     Final   Value: NO GROWTH 5 DAYS     Performed at [facility]   Report Status 04/23/2013 FINAL   Final  URINE CULTURE     Status: None   Collection Time    04/17/13  1:28 PM      Result Value Ref Range  Status   Specimen Description URINE, CATHETERIZED   Final   Special Requests NONE   Final   Culture  Setup Time     Final   Value: 04/17/2013 14:03     Performed at [place]     Final   Value: NO GROWTH     Performed at [facility]   Culture     Final   Value: NO GROWTH     Performed at [facility]   Report Status 04/18/2013 FINAL   Final    Imaging:   Imp: Gross hematuria, most likely secondary to initiation of anticoagulation, most likely source is the patient's prostate given his recent TURP.  Recommendations: I have placed a 24 French three-way Foley catheter in place the patient on gentle CBI. We will plan to wean the CBI as tolerated. The patient should be given a voiding trial prior to discharge once his urine clears.   Would continue to keep the patient on a heparin drip for now, I am hopeful that his hematuria will stop.  Thank you for involving me in this patient's care, I will continue to follow along.  Louis Meckel W

## 2013-06-13 NOTE — Progress Notes (Addendum)
ANTICOAGULATION CONSULT NOTE - Follow Up Consult  Pharmacy Consult for LMWH and coumadin Indication: DVT  No Known Allergies  Patient Measurements: Height: 5\' 11"  (180.3 cm) Weight: 164 lb 3.9 oz (74.5 kg) IBW/kg (Calculated) : 75.3  Vital Signs: Temp: 98.1 F (36.7 C) (04/03 0538) Temp src: Oral (04/03 0538) BP: 130/74 mmHg (04/03 0538) Pulse Rate: 70 (04/03 0538)  Labs:  Recent Labs  06/10/13 1437 06/11/13 0315  06/12/13 0530 06/12/13 1939 06/13/13 0055 06/13/13 0430  HGB 11.6* 9.6*  < > 9.5* 9.9* 9.6* 9.1*  HCT 35.1* 29.5*  < > 29.1* 30.0* 28.9* 28.0*  PLT 181 180  < > 194 217 211 209  APTT 33  --   --   --   --   --   --   LABPROT 14.1  --   --   --   --   --  15.2  INR 1.11  --   --   --   --   --  1.23  CREATININE 1.13 1.12  --  1.10  --   --   --   < > = values in this interval not displayed.  Estimated Creatinine Clearance: 50.8 ml/min (by C-G formula based on Cr of 1.1).  Assessment:  80 yo M on LMWH and coumadin for acute LLE DVT.  Today will be day # 2/5 of minimum overlap required for VTE core measure. Dark red urine w/ small red clots 4/2 PM;  gross hematuria in condom cath noted by MD 4/2 as well, but it improved on 4/2.  Hg 9.1 from 9.9; pltc 209; INR 1.23 from 1.11 after first dose of coumadin 7.5 mg.   He is also on plavix 75 mg.  MD plans to consult urology; TURP 3/9 w/ hematuria that had previously resolved.   ? Is he a candidate for IVC filter?   Coumadin score = 7.     Goal of Therapy:  INR 2-3 Anti-Xa level 0.6-1 units/ml 4hrs after LMWH dose given Monitor platelets by anticoagulation protocol: Yes   Plan:  1.  Continues on LMWH 80 sq q12h, could change to 110 mg sq q24 if desired for convenience at discharge.  2. Coumadin 5 mg po x 1 dose today 3. Coumadin education done w/ book and video with pt and family last night by RN 4. Daily INR 5. F/u urology consult re: hematuria 6. ? IVC filter?  Eudelia Bunch,  Pharm.D. [phone] 06/13/2013 11:02 AM

## 2013-06-14 LAB — BASIC METABOLIC PANEL
BUN: 17 mg/dL (ref 6–23)
CO2: 27 mEq/L (ref 19–32)
Calcium: 9.9 mg/dL (ref 8.4–10.5)
Chloride: 105 mEq/L (ref 96–112)
Creatinine, Ser: 1.06 mg/dL (ref 0.50–1.35)
GFR calc Af Amer: 71 mL/min — ABNORMAL LOW (ref >90)
GFR calc non Af Amer: 61 mL/min — ABNORMAL LOW (ref >90)
Glucose, Bld: 102 mg/dL — ABNORMAL HIGH (ref 70–99)
Potassium: 3.8 mEq/L (ref 3.7–5.3)
Sodium: 143 mEq/L (ref 137–147)

## 2013-06-14 LAB — CBC
HCT: 28.3 % — ABNORMAL LOW (ref 39.0–52.0)
Hemoglobin: 9.3 g/dL — ABNORMAL LOW (ref 13.0–17.0)
MCH: 30.1 pg (ref 26.0–34.0)
MCHC: 32.9 g/dL (ref 30.0–36.0)
MCV: 91.6 fL (ref 78.0–100.0)
Platelets: 214 10*3/uL (ref 150–400)
RBC: 3.09 MIL/uL — ABNORMAL LOW (ref 4.22–5.81)
RDW: 14.1 % (ref 11.5–15.5)
WBC: 5.9 10*3/uL (ref 4.0–10.5)

## 2013-06-14 LAB — PROTIME-INR
INR: 1.15 (ref 0.00–1.49)
Prothrombin Time: 14.5 seconds (ref 11.6–15.2)

## 2013-06-14 MED ORDER — WARFARIN SODIUM 7.5 MG PO TABS
7.5000 mg | ORAL_TABLET | Freq: Once | ORAL | Status: AC
  Administered 2013-06-14: 7.5 mg via ORAL
  Filled 2013-06-14: qty 1

## 2013-06-14 MED ORDER — SODIUM CHLORIDE 0.9 % IR SOLN
3000.0000 mL | Status: DC
  Administered 2013-06-14: 3000 mL
  Administered 2013-06-14: 900 mL
  Administered 2013-06-14 – 2013-06-18 (×42): 3000 mL

## 2013-06-14 NOTE — Progress Notes (Signed)
Subjective: Pt denies pain this morning. Edema is improved. Urology placed a catheter yesterday and was able to irrigate the bladder with improvement of the hematuria. Per notes, planning to d/c continuous bladder irrgation today and possible voiding trial in the am.    Objective: Vital signs in last 24 hours: Filed Vitals:   06/13/13 1236 06/13/13 1340 06/13/13 2127 06/14/13 0519  BP:  141/86 127/74 127/68  Pulse: 91 70 77 71  Temp:  98.6 F (37 C) 98.5 F (36.9 C) 98 F (36.7 C)  TempSrc:  Oral Oral Oral  Resp:  18 18 18   Height:      Weight:      SpO2:  97% 91% 96%   Weight change:   Intake/Output Summary (Last 24 hours) at 06/14/13 1045 Last data filed at 06/14/13 0900  Gross per 24 hour  Intake  30480 ml  Output  24900 ml  Net   5580 ml   Vitals reviewed. General: Lying in bed sleeping, NAD HEENT: PERRL, EOMI Cardiac: RRR, no rubs, murmurs or gallops Pulm: Clear to auscultation bilaterally, no wheezes, rales, or rhonchi Abd: Soft, nontender, nondistended Ext: Warm and well perfused. LLE with trace edema at the knee and 1+ at the lateral hip, none at the LE distal to the knee; no erythema. TED hose in place bilaterally. Decreased pain in the with strong DP pulses in BLE. Moves all 4 extremities. Neuro: Very hard of hearing. Alert and oriented to person, cranial nerves II-XII grossly intact, non focal   Lab Results: Basic Metabolic Panel:  Recent Labs Lab 06/10/13 1437  06/12/13 0530 06/14/13 0502  NA 143  < > 139 143  K 4.7  < > 3.9 3.8  CL 106  < > 103 105  CO2 26  < > 24 27  GLUCOSE 113*  < > 96 102*  BUN 23  < > 20 17  CREATININE 1.13  < > 1.10 1.06  CALCIUM 11.4*  < > 9.9 9.9  MG 2.0  --   --   --   < > = values in this interval not displayed. CBC:  Recent Labs Lab 06/10/13 1437  06/13/13 0430 06/14/13 0502  WBC 7.2  < > 4.9 5.9  NEUTROABS 5.3  --   --   --   HGB 11.6*  < > 9.1* 9.3*  HCT 35.1*  < > 28.0* 28.3*  MCV 92.4  < > 91.2 91.6    PLT 181  < > 209 214  < > = values in this interval not displayed. Coagulation:  Recent Labs Lab 06/10/13 1437 06/13/13 0430 06/14/13 0502  LABPROT 14.1 15.2 14.5  INR 1.11 1.23 1.15   Urinalysis:  Recent Labs Lab 06/11/13 0323  COLORURINE AMBER*  LABSPEC 1.015  PHURINE 8.0  GLUCOSEU NEGATIVE  HGBUR LARGE*  BILIRUBINUR NEGATIVE  KETONESUR NEGATIVE  PROTEINUR 30*  UROBILINOGEN 1.0  NITRITE NEGATIVE  LEUKOCYTESUR LARGE*    Micro Results: No results found for this or any previous visit (from the past 240 hour(s)).  Studies/Results: No results found.  Medications: I have reviewed the patient's current medications. Scheduled Meds: . atorvastatin  20 mg Oral q1800  . benazepril  20 mg Oral q morning - 10a  . cholecalciferol  1,000 Units Oral Daily  . clopidogrel  75 mg Oral Q breakfast  . enoxaparin (LOVENOX) injection  80 mg Subcutaneous Q12H  . metoprolol tartrate  25 mg Oral BID  . omega-3 acid ethyl esters  1 g Oral Daily  . sodium chloride  3 mL Intravenous Q12H  . warfarin   Does not apply Once  . Warfarin - Pharmacist Dosing Inpatient   Does not apply q1800   Continuous Infusions:  PRN Meds:.  Assessment/Plan: 80yo M w/ PMH HTN, BPH s/p TURP on 05/19/13, cerebellar stroke '13, CKD Stage II, and HLD who presented with acute LLE edema and pain and was found to have large clot burden in her LLE venous system.   #Acute LLE DVT: Pt s/p TURPs on 05/19/13 for BPH. He was brought to the ED on 3/31 with significant edema and pain. BLE dopplers were ordered and reveled a large clot burden throughout the left leg. Pt on Lovenox and Coumadin; INR subtherapetic today at 1.15. He is going to a SNF at d/c, and will be in a controlled environment so falls are less of a concern on anticoagulation.  - Continue Lovenox as bridge  - Coumadin per Pharmacy - Compression stockings   #Hematuria: Pt with no gross hematuria on admission. Lovenox started on admission for DVT.  Unfortunatley gross hematuria began after starting the Lovenox, likely from the prostate. Urology consuotled and placed catheter and started CBI. Hematuria improving. Hgb stable. Continuing anticoagulation for DVT. Stopping CBI today and possibly d/c catheter in the am.  - CBC qam - F/u w/ Urology, appreciate recommendations.   #Acute on chronic anemia, normocytic: Stable. Baseline Hgb around 12. H/o TURPs 3/9 with hematuria that has resolved per the daughter. No gross hematuria on admission. Hgb 11.6 this admission, 12.2 prior to procedure. Lovenox started on admission and Hgb dropped to 9.6 --> 9.3 today. Continuing the Lovenox and Coumadin.   Recent Labs Lab 06/12/13 0530 06/12/13 1939 06/13/13 0055 06/13/13 0430 06/14/13 0502  HGB 9.5* 9.9* 9.6* 9.1* 9.3*   - q12h CBC  #Hypercalcemia: Ca this admission elevated to 11.4 with previously elevated levels. PTH normal in Feb,and Ca normalized to 9.9. Checking Vit D levels, and PTH-related protein ordered to help determine if his hypercalcemia could be related to malignancy. If this is normal and Ca remains normal, he will need to have his calcium monitored as an out patient and will need SPEP and UPEP checked. - PTH- related protein pending - Vit D levels pending  #HTN: Stable. BP well controlled on home lopressor and benazepril  - Lopressor 25mg  BID - Benazepril 20mg  daily  #Frequent falls in setting of Cerebellar Stroke: H/o CVA in 2013 which has led to frequent falls and the requirement of a walker. He is currently at baseline neurological status. PT/OT consulted in the hospital and rec SNF placement. - Continue PT  #Chronic Kidney Disease, Stage 2: Stable. Creatinine near baseline of 1.0 with good urine output.   Recent Labs Lab 06/10/13 1437 06/11/13 0315 06/12/13 0530 06/14/13 0502  CREATININE 1.13 1.12 1.10 1.06    #Hyperlipidemia: Stable. Last lipid panel normal on 07/20/11.  - Continue home atorvastatin 20 mg daily     Dispo: Disposition is deferred at this time, awaiting improvement of current medical problems.  Will d/c to SNF once ready, hopefully on Monday.   The patient does have a current PCP Joni Reining, DO) and does need an [facility] hospital follow-up appointment after discharge.  The patient does not have transportation limitations that hinder transportation to clinic appointments.  .Services Needed at time of discharge: Y = Yes, Blank = No PT:   OT:   RN:   Equipment:   Other:  LOS: 4 days   Otho Bellows, MD 06/14/2013, 10:45 AM

## 2013-06-14 NOTE — Progress Notes (Signed)
ANTICOAGULATION CONSULT NOTE - Follow Up Consult  Pharmacy Consult for LMWH and coumadin Indication: DVT  No Known Allergies  Patient Measurements: Height: 5\' 11"  (180.3 cm) Weight: 164 lb 3.9 oz (74.5 kg) IBW/kg (Calculated) : 75.3  Vital Signs: Temp: 98 F (36.7 C) (04/04 0519) Temp src: Oral (04/04 0519) BP: 131/62 mmHg (04/04 1057) Pulse Rate: 68 (04/04 1057)  Labs:  Recent Labs  06/12/13 0530  06/13/13 0055 06/13/13 0430 06/14/13 0502  HGB 9.5*  < > 9.6* 9.1* 9.3*  HCT 29.1*  < > 28.9* 28.0* 28.3*  PLT 194  < > 211 209 214  LABPROT  --   --   --  15.2 14.5  INR  --   --   --  1.23 1.15  CREATININE 1.10  --   --   --  1.06  < > = values in this interval not displayed.  Estimated Creatinine Clearance: 52.7 ml/min (by C-G formula based on Cr of 1.06).  Assessment:  80 yo M on LMWH and coumadin for acute LLE DVT.  Today will be day # 3/5 of minimum overlap required for VTE core measure. He was started on CBI 4/3 for dark red urine w/ small red clots.  Per urology his urine is clear now on slow irrigation. Hg 9.3 is stable.  PLTC 214 is stable.  Urology plans to DC CBI and consider voiding trial in am.   INR 1.15 after coumadin 7.5 and 5 mg doses.   He is also on plavix 75 mg. INR still baseline after 2 doses of coumadin.    Goal of Therapy:  INR 2-3 Anti-Xa level 0.6-1 units/ml 4hrs after LMWH dose given Monitor platelets by anticoagulation protocol: Yes   Plan:  1.  Continues on LMWH 80 sq q12h, could change to 110 mg sq q24 if desired for convenience at discharge.  2. Coumadin 7.5 mg po x 1 dose today 3. Coumadin education completed 4. Daily INR 5. F/u hematuria 6. ? IVC filter?  Eudelia Bunch, Pharm.D. [phone] 06/14/2013 11:00 AM

## 2013-06-14 NOTE — Progress Notes (Signed)
CBI to moderate rate with red to pink urine and occasional small clots, easily cleared.  Pt has bleeding at insertion of catheter, bleeding increasing with pt. movement. RN cleaned around site twice over course of the night.  Pt tender to touch but no complaints unless touched.  RN will share with day shift RN and continue to monitor.  Call light in reach, pt resting in bed.   Nolon Nations, RN

## 2013-06-14 NOTE — Progress Notes (Signed)
Patient ID: [name], male   DOB: Jul 20, 1924, 80 y.o.   MRN: [id number] 1 Day Post-Op  Subjective: [name] is on CBI for gross hematuria secondary to anticoagulation for a DVT following a TURP.   He is doing well today without complaints.   The urine is clear on slow irrigation.  His Hgb is stable. ROS:  Review of Systems  Constitutional: Negative for fever.  Gastrointestinal: Negative for abdominal pain.    Objective: Vital signs in last 24 hours: Temp:  [98 F (36.7 C)-98.6 F (37 C)] 98 F (36.7 C) (04/04 0519) Pulse Rate:  [70-91] 71 (04/04 0519) Resp:  [18] 18 (04/04 0519) BP: (118-141)/(68-86) 127/68 mmHg (04/04 0519) SpO2:  [91 %-97 %] 96 % (04/04 0519)  Intake/Output from previous day: 04/03 0701 - 04/04 0700 In: 30360 [P.O.:360] Out: 23000 [Urine:23000] Intake/Output this shift: Total I/O In: -  Out: 1100 [Urine:1100]   Physical Exam  Constitutional: He is well-developed, well-nourished, and in no distress.  Abdominal: Soft. He exhibits no mass. There is no tenderness.    Lab Results:   Recent Labs  06/13/13 0430 06/14/13 0502  WBC 4.9 5.9  HGB 9.1* 9.3*  HCT 28.0* 28.3*  PLT 209 214   BMET  Recent Labs  06/12/13 0530 06/14/13 0502  NA 139 143  K 3.9 3.8  CL 103 105  CO2 24 27  GLUCOSE 96 102*  BUN 20 17  CREATININE 1.10 1.06  CALCIUM 9.9 9.9   PT/INR  Recent Labs  06/13/13 0430 06/14/13 0502  LABPROT 15.2 14.5  INR 1.23 1.15   ABG No results found for this basename: PHART, PCO2, PO2, HCO3,  in the last 72 hours  Studies/Results: No results found.  Anti-infectives: Anti-infectives   None      Current Facility-Administered Medications  Medication Dose Route Frequency Provider Last Rate Last Dose  . atorvastatin (LIPITOR) tablet 20 mg  20 mg Oral q1800 Otho Bellows, MD   20 mg at 06/13/13 1750  . benazepril (LOTENSIN) tablet 20 mg  20 mg Oral q morning - 10a Otho Bellows, MD   20 mg at 06/13/13 1236  .  cholecalciferol (VITAMIN D) tablet 1,000 Units  1,000 Units Oral Daily Otho Bellows, MD   1,000 Units at 06/13/13 1237  . clopidogrel (PLAVIX) tablet 75 mg  75 mg Oral Q breakfast Otho Bellows, MD   75 mg at 06/13/13 0600  . enoxaparin (LOVENOX) injection 80 mg  80 mg Subcutaneous Q12H Rande Lawman Rumbarger, RPH   80 mg at 06/14/13 6314  . metoprolol tartrate (LOPRESSOR) tablet 25 mg  25 mg Oral BID Otho Bellows, MD   25 mg at 06/13/13 2127  . omega-3 acid ethyl esters (LOVAZA) capsule 1 g  1 g Oral Daily Otho Bellows, MD   1 g at 06/13/13 1237  . sodium chloride 0.9 % injection 3 mL  3 mL Intravenous Q12H Otho Bellows, MD   3 mL at 06/13/13 2200  . warfarin (COUMADIN) video   Does not apply Once Eudelia Bunch, RPH      . Warfarin - Pharmacist Dosing Inpatient   Does not apply q1800 Eudelia Bunch, [facility]        Assessment: s/p Procedure(s): CYSTOSCOPY FLEXIBLE BEDSIDE  Gross hematuria is clearing.   Plan: D/C CBI and consider voiding trial in am.      LOS: 4 days    Kyrese Gartman J 06/14/2013

## 2013-06-14 NOTE — Progress Notes (Signed)
Urology MD paged and aware, Pt's has had 150cc  Of bloody urine output since continuous bladder irrigation discontinued.Pt with no complaints of pain or discomfort. New orders given to restart continuous bladder irrigation. Will continue to monitor.

## 2013-06-15 DIAGNOSIS — R339 Retention of urine, unspecified: Secondary | ICD-10-CM | POA: Diagnosis present

## 2013-06-15 LAB — CBC
HCT: 25.5 % — ABNORMAL LOW (ref 39.0–52.0)
HCT: 24.2 % — ABNORMAL LOW (ref 39.0–52.0)
HCT: 22.4 % — ABNORMAL LOW (ref 39.0–52.0)
Hemoglobin: 8.7 g/dL — ABNORMAL LOW (ref 13.0–17.0)
Hemoglobin: 8.1 g/dL — ABNORMAL LOW (ref 13.0–17.0)
Hemoglobin: 7.5 g/dL — ABNORMAL LOW (ref 13.0–17.0)
MCH: 31.1 pg (ref 26.0–34.0)
MCH: 30.6 pg (ref 26.0–34.0)
MCH: 30.5 pg (ref 26.0–34.0)
MCHC: 34.1 g/dL (ref 30.0–36.0)
MCHC: 33.5 g/dL (ref 30.0–36.0)
MCHC: 33.5 g/dL (ref 30.0–36.0)
MCV: 91.1 fL (ref 78.0–100.0)
MCV: 91.3 fL (ref 78.0–100.0)
MCV: 91.1 fL (ref 78.0–100.0)
Platelets: 200 10*3/uL (ref 150–400)
Platelets: 213 10*3/uL (ref 150–400)
Platelets: 207 10*3/uL (ref 150–400)
RBC: 2.8 MIL/uL — ABNORMAL LOW (ref 4.22–5.81)
RBC: 2.65 MIL/uL — ABNORMAL LOW (ref 4.22–5.81)
RBC: 2.46 MIL/uL — ABNORMAL LOW (ref 4.22–5.81)
RDW: 13.9 % (ref 11.5–15.5)
RDW: 14 % (ref 11.5–15.5)
RDW: 13.9 % (ref 11.5–15.5)
WBC: 5.5 10*3/uL (ref 4.0–10.5)
WBC: 5.8 10*3/uL (ref 4.0–10.5)
WBC: 6 10*3/uL (ref 4.0–10.5)

## 2013-06-15 LAB — PROTIME-INR
INR: 1.31 (ref 0.00–1.49)
Prothrombin Time: 16 seconds — ABNORMAL HIGH (ref 11.6–15.2)

## 2013-06-15 LAB — ABO/RH: ABO/RH(D): A POS

## 2013-06-15 MED ORDER — WARFARIN SODIUM 5 MG PO TABS
5.0000 mg | ORAL_TABLET | Freq: Once | ORAL | Status: DC
  Filled 2013-06-15: qty 1

## 2013-06-15 NOTE — Progress Notes (Addendum)
Patient ID: [name], male   DOB: 11-Sep-1924, 80 y.o.   MRN: [id number] 2 Days Post-Op  Subjective: [name] had leakage around the foley in the night.  He remains on CBI and the drainage appeared to be clearing, but I hand irrigated his catheter and there was a small clot that may have been causing some obstruction.   The urine then stayed somewhat bloody with moderate CBI.   He has no specific complaints.   His plavix was held but he remains on lovenox and warfarin.  ROS:  Review of Systems  Constitutional: Negative for fever.  Gastrointestinal: Negative for abdominal pain.  Genitourinary: Positive for hematuria.    Anti-infectives: Anti-infectives   None      Current Facility-Administered Medications  Medication Dose Route Frequency Provider Last Rate Last Dose  . atorvastatin (LIPITOR) tablet 20 mg  20 mg Oral q1800 Otho Bellows, MD   20 mg at 06/14/13 1720  . benazepril (LOTENSIN) tablet 20 mg  20 mg Oral q morning - 10a Otho Bellows, MD   20 mg at 06/14/13 1057  . cholecalciferol (VITAMIN D) tablet 1,000 Units  1,000 Units Oral Daily Otho Bellows, MD   1,000 Units at 06/14/13 1056  . enoxaparin (LOVENOX) injection 80 mg  80 mg Subcutaneous Q12H Rande Lawman Rumbarger, RPH   80 mg at 06/15/13 1884  . metoprolol tartrate (LOPRESSOR) tablet 25 mg  25 mg Oral BID Otho Bellows, MD   25 mg at 06/14/13 2124  . omega-3 acid ethyl esters (LOVAZA) capsule 1 g  1 g Oral Daily Otho Bellows, MD   1 g at 06/14/13 1056  . sodium chloride 0.9 % injection 3 mL  3 mL Intravenous Q12H Otho Bellows, MD   3 mL at 06/14/13 2124  . sodium chloride irrigation 0.9 % 3,000 mL  3,000 mL Irrigation Continuous Axel Filler, MD   3,000 mL at 06/15/13 0807  . warfarin (COUMADIN) video   Does not apply Once Eudelia Bunch, RPH      . Warfarin - Pharmacist Dosing Inpatient   Does not apply q1800 Eudelia Bunch, RPH         Objective: Vital signs in last 24 hours: Temp:  [98.2  F (36.8 C)-98.4 F (36.9 C)] 98.2 F (36.8 C) (04/05 0331) Pulse Rate:  [66-71] 68 (04/05 0331) Resp:  [18-20] 20 (04/05 0331) BP: (127-137)/(62-78) 137/78 mmHg (04/05 0331) SpO2:  [95 %-96 %] 95 % (04/05 0331)  Intake/Output from previous day: 04/04 0701 - 04/05 0700 In: 16606 [P.O.:960] Out: 45500 [Urine:45500] Intake/Output this shift: Total I/O In: 3120 [P.O.:120; Other:3000] Out: 5750 [Urine:5750]   Physical Exam  Constitutional: He is well-developed, well-nourished, and in no distress. No distress.  HENT:  He is very HOH  Abdominal: Soft. He exhibits no mass. There is no tenderness.  Genitourinary:  Foley remains in good position draining bloody urine on CBI.     Lab Results:   Recent Labs  06/14/13 0502 06/15/13 0320  WBC 5.9 5.5  HGB 9.3* 8.7*  HCT 28.3* 25.5*  PLT 214 200   BMET  Recent Labs  06/14/13 0502  NA 143  K 3.8  CL 105  CO2 27  GLUCOSE 102*  BUN 17  CREATININE 1.06  CALCIUM 9.9   PT/INR  Recent Labs  06/14/13 0502 06/15/13 0320  LABPROT 14.5 16.0*  INR 1.15 1.31   ABG No results found for this basename: PHART,  PCO2, PO2, HCO3,  in the last 72 hours  Studies/Results: No results found.  Recent labs reviewed.   INR is not therapeutic yet.  Assessment: s/p Procedure(s): CYSTOSCOPY FLEXIBLE BEDSIDE  He continue to have bleeding with occasional clot obstruction on CBI and his Hgb is down to 8.7. He remains on lovenox and warfarin. The Plavix was stopped.   Plan: At this time I believe it would be best to consider a temporary vena caval filter and hold anticoagulation until the hematuria has fully resolved.   With his recent TURP he is at high risk for persistent/recurrent bleeding on anticoagulation.   I hand irrigated his foley with about 240cc of saline with return of a small clot.  The urine didn't fully clear with irrigation.    LOS: 5 days    Eulan Heyward J 06/15/2013

## 2013-06-15 NOTE — Progress Notes (Signed)
Utilization review completed.  

## 2013-06-15 NOTE — Progress Notes (Signed)
ANTICOAGULATION CONSULT NOTE - Follow Up Consult  Pharmacy Consult for LMWH and coumadin Indication: DVT  No Known Allergies  Patient Measurements: Height: 5\' 11"  (180.3 cm) Weight: 164 lb 3.9 oz (74.5 kg) IBW/kg (Calculated) : 75.3  Vital Signs: Temp: 98.2 F (36.8 C) (04/05 0331) Temp src: Oral (04/05 0331) BP: 137/78 mmHg (04/05 0331) Pulse Rate: 68 (04/05 0331)  Labs:  Recent Labs  06/13/13 0430 06/14/13 0502 06/15/13 0320  HGB 9.1* 9.3* 8.7*  HCT 28.0* 28.3* 25.5*  PLT 209 214 200  LABPROT 15.2 14.5 16.0*  INR 1.23 1.15 1.31  CREATININE  --  1.06  --     Estimated Creatinine Clearance: 52.7 ml/min (by C-G formula based on Cr of 1.06).  80 yo M on LMWH and coumadin for acute LLE DVT.  Today will be day # 4/5 of minimum overlap required for VTE core measure. He was started on CBI 4/3 for dark red urine w/ small red clots which was stopped 4/4 and then resumed after 150 cc bloody urine.  4/5 urology irrigated his catheter found a clot obstruction and continued bleeding.  Urology: consider temporary IVC and hold anticoag until hematuria fully resolved.  Hg down to 8.7 from 9.3.  PLTC 200 is stable.  Plavix stopped 4/5. Last dose of plavix 4/4.   INR 1.31 after coumadin 7.5 - 5  - 7.5 mg doses.      Goal of Therapy:  INR 2-3 Anti-Xa level 0.6-1 units/ml 4hrs after LMWH dose given Monitor platelets by anticoagulation protocol: Yes   Plan:  1.  Continues on LMWH 80 sq q12h, could change to 110 mg sq q24 if desired for convenience at discharge.  2. Coumadin 5 mg po x 1 dose today if plan to continue coumadin today.  RN will f/u with MD before giving couamdin 3. Coumadin education completed 4. Daily INR 5. F/u hematuria 6. ? IVC filter?   Eudelia Bunch, Pharm.D. [phone] 06/15/2013 1:06 PM

## 2013-06-15 NOTE — Progress Notes (Addendum)
I called IR for IVC filter placement. Unfortunately they left the hospital for the day. I spoke to Dr. Annamaria Boots with IR, and because the pt's vital are stable and his Hgb has been fairly stable, he did not feel that emergent IVC filter placement was necessary. He asked that I schedule him for IVC filter placement for tomorrow and stop all anticoagulation.   I did speak to the patient's daughter again regarding stopping all anticoagulation. I discussed the risks with her, including new clot formation, and the potential for PE or stroke. She acknowledged understanding and would like to proceed with filter placement and holding of anticoagulation.  However, given his significant clot burden on admission, I feel that he needs to remain on some form of anticoagulation until the IVC filter is placed. I have spoken to [facility] attending, Dr. Ellwood Dense, and she agrees. Continuing Lovenox for 1 more dose around 6pm and then stopping. Will check CBC q8h and transfuse for Hgb <7.5.

## 2013-06-15 NOTE — Progress Notes (Signed)
Subjective: Still with hematuria and now with bleeding from around the catheter. Pt denies pain this morning. Edema is improved. Still performing CBI per Urology. Plavix held this morning. Urology recommends placing IVC filter and stopping anticoagulation for DVT.   Objective: Vital signs in last 24 hours: Filed Vitals:   06/14/13 1057 06/14/13 1454 06/14/13 2037 06/15/13 0331  BP: 131/62 127/67 128/73 137/78  Pulse: 68 71 66 68  Temp:  98.2 F (36.8 C) 98.4 F (36.9 C) 98.2 F (36.8 C)  TempSrc:  Oral Oral Oral  Resp:  18 18 20   Height:      Weight:      SpO2:  96% 95% 95%   Weight change:   Intake/Output Summary (Last 24 hours) at 06/15/13 1233 Last data filed at 06/15/13 1224  Gross per 24 hour  Intake  42720 ml  Output  50100 ml  Net  -7380 ml   Vitals reviewed. General: Lying in bed sleeping, NAD HEENT: PERRL, EOMI Cardiac: RRR, no rubs, murmurs or gallops Pulm: Clear to auscultation bilaterally, no wheezes, rales, or rhonchi Abd: Soft, nontender, non-distended GU: Catheter in place, sanguinous drainage from around the penis Ext: Warm and well perfused. LLE with only trace edema; no erythema. TED hose in place bilaterally. Decreased pain in the with strong DP pulses in BLE. Moves all 4 extremities. Neuro: Very hard of hearing. Alert and oriented to person, cranial nerves II-XII grossly intact, non focal   Lab Results: Basic Metabolic Panel:  Recent Labs Lab 06/10/13 1437  06/12/13 0530 06/14/13 0502  NA 143  < > 139 143  K 4.7  < > 3.9 3.8  CL 106  < > 103 105  CO2 26  < > 24 27  GLUCOSE 113*  < > 96 102*  BUN 23  < > 20 17  CREATININE 1.13  < > 1.10 1.06  CALCIUM 11.4*  < > 9.9 9.9  MG 2.0  --   --   --   < > = values in this interval not displayed. CBC:  Recent Labs Lab 06/10/13 1437  06/14/13 0502 06/15/13 0320  WBC 7.2  < > 5.9 5.5  NEUTROABS 5.3  --   --   --   HGB 11.6*  < > 9.3* 8.7*  HCT 35.1*  < > 28.3* 25.5*  MCV 92.4  < > 91.6  91.1  PLT 181  < > 214 200  < > = values in this interval not displayed. Coagulation:  Recent Labs Lab 06/10/13 1437 06/13/13 0430 06/14/13 0502 06/15/13 0320  LABPROT 14.1 15.2 14.5 16.0*  INR 1.11 1.23 1.15 1.31   Urinalysis:  Recent Labs Lab 06/11/13 0323  COLORURINE AMBER*  LABSPEC 1.015  PHURINE 8.0  GLUCOSEU NEGATIVE  HGBUR LARGE*  BILIRUBINUR NEGATIVE  KETONESUR NEGATIVE  PROTEINUR 30*  UROBILINOGEN 1.0  NITRITE NEGATIVE  LEUKOCYTESUR LARGE*    Micro Results: No results found for this or any previous visit (from the past 240 hour(s)).  Studies/Results: No results found.  Medications: I have reviewed the patient's current medications. Scheduled Meds: . atorvastatin  20 mg Oral q1800  . benazepril  20 mg Oral q morning - 10a  . cholecalciferol  1,000 Units Oral Daily  . enoxaparin (LOVENOX) injection  80 mg Subcutaneous Q12H  . metoprolol tartrate  25 mg Oral BID  . omega-3 acid ethyl esters  1 g Oral Daily  . sodium chloride  3 mL Intravenous Q12H  . warfarin  Does not apply Once  . Warfarin - Pharmacist Dosing Inpatient   Does not apply q1800   Continuous Infusions: . sodium chloride irrigation     PRN Meds:.  Assessment/Plan: 80yo M w/ PMH HTN, BPH s/p TURP on 05/19/13, cerebellar stroke '13, CKD Stage II, and HLD who presented with acute LLE edema and pain and was found to have large clot burden in her LLE venous system and now has persistent gross hematuria on anticoagulation.   #Acute LLE DVT: Pt s/p TURPs on 05/19/13 for BPH. He was brought to the ED on 3/31 with significant edema and pain. BLE dopplers were ordered and reveled a large clot burden throughout the left leg. Pt on Lovenox and Coumadin; INR remains subtherapetic. His edema and pain are resolved; unfortunately he is having gross hematuria that has persisted despite catheter placement and continuous bladder irrigation. Will discuss IVC filter placement with Vascular Surgery and discuss  stopping anticoagulation. Discussed with daughter and she would like to proceed with the IVC filter, but would like to speak to the Vascular Surgeon b/f it is placed- she is driving in from out of town currently.  - VVS consult - Continue Lovenox as bridge for now - Coumadin per Pharmacy for now - Compression stockings   #Hematuria: Pt with no gross hematuria on admission. Lovenox started on admission for DVT. Unfortunatley gross hematuria began after starting the Lovenox, likely from the prostate. Urology consutled and placed catheter and started CBI. Hematuria improved initially but worsened overnight and CBI restarted. Hgb decreased this morning. Holding Plavix. Consulting Vascular Surgery for possible IVC filter so we can stop anticoagulation for now due to continued bleeding.  - CBC q6 - F/u w/ Urology, appreciate recommendations.   #Acute on chronic anemia, normocytic: Stable. Baseline Hgb around 12. H/o TURPs 3/9 with hematuria that has resolved per the daughter. No gross hematuria on admission. Hgb 11.6 this admission, 12.2 prior to procedure. Lovenox started on admission and Hgb dropped to 9.6 --> 8.7 today. Continuing the Lovenox and Coumadin for now, VVS consult pending.  Recent Labs Lab 06/12/13 1939 06/13/13 0055 06/13/13 0430 06/14/13 0502 06/15/13 0320  HGB 9.9* 9.6* 9.1* 9.3* 8.7*   - q12h CBC  #Hypercalcemia: Ca this admission elevated to 11.4 with previously elevated levels. PTH normal in Feb,and Ca normalized to 9.9. Checking Vit D levels, and PTH-related protein ordered to help determine if his hypercalcemia could be related to malignancy. If this is normal and Ca remains normal, he will need to have his calcium monitored as an out patient and will need SPEP and UPEP checked. - PTH- related protein pending - Vit D levels pending  #HTN: Stable. BP well controlled on home lopressor and benazepril  - Lopressor 25mg  BID - Benazepril 20mg  daily  #Frequent falls in  setting of Cerebellar Stroke: H/o CVA in 2013 which has led to frequent falls and the requirement of a walker. He is currently at baseline neurological status. PT/OT consulted in the hospital and rec SNF placement. - Continue PT  #Chronic Kidney Disease, Stage 2: Stable. Creatinine near baseline of 1.0.   Recent Labs Lab 06/10/13 1437 06/11/13 0315 06/12/13 0530 06/14/13 0502  CREATININE 1.13 1.12 1.10 1.06    #Hyperlipidemia: Stable. Last lipid panel normal on 07/20/11.  - Continue home atorvastatin 20 mg daily    Dispo: Disposition is deferred at this time, awaiting improvement of current medical problems.  Will d/c to SNF once ready, hopefully on Monday.   The patient  does have a current PCP Joni Reining, DO) and does need an [facility] hospital follow-up appointment after discharge.  The patient does not have transportation limitations that hinder transportation to clinic appointments.  .Services Needed at time of discharge: Y = Yes, Blank = No PT:   OT:   RN:   Equipment:   Other:     LOS: 5 days   Otho Bellows, MD 06/15/2013, 12:33 PM

## 2013-06-16 ENCOUNTER — Encounter (HOSPITAL_COMMUNITY): Payer: Self-pay | Admitting: Urology

## 2013-06-16 ENCOUNTER — Inpatient Hospital Stay (HOSPITAL_COMMUNITY): Payer: Medicare Other

## 2013-06-16 DIAGNOSIS — N182 Chronic kidney disease, stage 2 (mild): Secondary | ICD-10-CM

## 2013-06-16 DIAGNOSIS — E785 Hyperlipidemia, unspecified: Secondary | ICD-10-CM

## 2013-06-16 DIAGNOSIS — I82409 Acute embolism and thrombosis of unspecified deep veins of unspecified lower extremity: Secondary | ICD-10-CM

## 2013-06-16 DIAGNOSIS — R310 Gross hematuria: Secondary | ICD-10-CM

## 2013-06-16 DIAGNOSIS — D649 Anemia, unspecified: Secondary | ICD-10-CM

## 2013-06-16 DIAGNOSIS — I129 Hypertensive chronic kidney disease with stage 1 through stage 4 chronic kidney disease, or unspecified chronic kidney disease: Secondary | ICD-10-CM

## 2013-06-16 LAB — CBC
HCT: 25.9 % — ABNORMAL LOW (ref 39.0–52.0)
HCT: 26.2 % — ABNORMAL LOW (ref 39.0–52.0)
Hemoglobin: 8.8 g/dL — ABNORMAL LOW (ref 13.0–17.0)
Hemoglobin: 8.6 g/dL — ABNORMAL LOW (ref 13.0–17.0)
MCH: 30.8 pg (ref 26.0–34.0)
MCH: 30.1 pg (ref 26.0–34.0)
MCHC: 34 g/dL (ref 30.0–36.0)
MCHC: 32.8 g/dL (ref 30.0–36.0)
MCV: 90.6 fL (ref 78.0–100.0)
MCV: 91.6 fL (ref 78.0–100.0)
Platelets: 188 10*3/uL (ref 150–400)
Platelets: 192 10*3/uL (ref 150–400)
RBC: 2.86 MIL/uL — ABNORMAL LOW (ref 4.22–5.81)
RBC: 2.86 MIL/uL — ABNORMAL LOW (ref 4.22–5.81)
RDW: 14.2 % (ref 11.5–15.5)
RDW: 14.5 % (ref 11.5–15.5)
WBC: 6.4 10*3/uL (ref 4.0–10.5)
WBC: 6.3 10*3/uL (ref 4.0–10.5)

## 2013-06-16 LAB — PROTIME-INR
INR: 1.42 (ref 0.00–1.49)
Prothrombin Time: 17 seconds — ABNORMAL HIGH (ref 11.6–15.2)

## 2013-06-16 LAB — PREPARE RBC (CROSSMATCH)

## 2013-06-16 MED ORDER — MIDAZOLAM HCL 2 MG/2ML IJ SOLN
INTRAMUSCULAR | Status: AC
  Filled 2013-06-16: qty 4

## 2013-06-16 MED ORDER — FENTANYL CITRATE 0.05 MG/ML IJ SOLN
INTRAMUSCULAR | Status: AC | PRN
  Administered 2013-06-16 (×2): 25 ug via INTRAVENOUS

## 2013-06-16 MED ORDER — MIDAZOLAM HCL 2 MG/2ML IJ SOLN
INTRAMUSCULAR | Status: AC | PRN
  Administered 2013-06-16: 1 mg via INTRAVENOUS
  Administered 2013-06-16: 0.5 mg via INTRAVENOUS

## 2013-06-16 MED ORDER — IOHEXOL 300 MG/ML  SOLN
100.0000 mL | Freq: Once | INTRAMUSCULAR | Status: AC | PRN
  Administered 2013-06-16: 50 mL via INTRAVENOUS

## 2013-06-16 MED ORDER — FENTANYL CITRATE 0.05 MG/ML IJ SOLN
INTRAMUSCULAR | Status: AC
  Filled 2013-06-16: qty 2

## 2013-06-16 NOTE — H&P (Signed)
[name] is an 80 y.o. male.   Chief Complaint: Pt with recent hx of TURP surgery for BPH Surgery was 05/19/2013 dc'd from hospital - uneventful Developed L leg pain; swelling Returned and admitted with + LLE doppler for DVT Started on coumadin and Lovenox Developed gross hematuria Now off Coumadin; Last dose Lovenox last pm Pt also has high fall risk- secondary weakness from CVA 2013 Request made for retrievable inferior vena cava filter placement  HPI: HTN; BPH- TURP; HOH; fall risk; CKD; CVA  Past Medical History  Diagnosis Date  . Hypertension   . Difficulty hearing, right     BILATERAL HEARING LOSS - BEST TO TRY TO SPEAK INTO LEFT EAR  . Meningioma   . Frequent falls   . BPH (benign prostatic hypertrophy)     TURP 05/19/13  . Chronic kidney disease     CHRONIC KIDNEY DISEASE, 2  . Anemia   . Thrombocytopenia   . Stroke     Cerebellar, 2013; WALKS WITH WALKER, ABLE TO DRESS AND BATHE HIMSELF BUT FAMILY TRIES TO PROVIDE SUPERVISION BECAUSE OF HIS HX OF FALL AND WEAKNESS LEGS, ARMS   . Incontinence of urine     SOME INCONTINENCE    Past Surgical History  Procedure Laterality Date  . Tube put in ear yrs ago    . Transurethral resection of prostate N/A 05/19/2013    Procedure: TRANSURETHRAL RESECTION OF THE PROSTATE WITH GYRUS INSTRUMENTS;  Surgeon: Ailene Rud, MD;  Location: [facility];  Service: Urology;  Laterality: N/A;    Family History  Problem Relation Age of Onset  . Diabetes Mother   . Heart disease Mother    Social History:  reports that he has quit smoking. His smoking use included Cigarettes. He smoked 0.00 packs per day. He does not have any smokeless tobacco history on file. He reports that he does not drink alcohol or use illicit drugs.  Allergies: No Known Allergies  Medications Prior to Admission  Medication Sig Dispense Refill  . atorvastatin (LIPITOR) 20 MG tablet Take 20 mg by mouth daily at 6 PM.      . benazepril (LOTENSIN) 20 MG tablet  Take 20 mg by mouth every morning.      . Cholecalciferol (VITAMIN D PO) Take 1 tablet by mouth daily.      . clopidogrel (PLAVIX) 75 MG tablet Take 75 mg by mouth daily with breakfast.      . Iron-Vitamins (GERITOL PO) Take 1 tablet by mouth daily.      . metoprolol tartrate (LOPRESSOR) 25 MG tablet Take 25 mg by mouth 2 (two) times daily.      Marland Kitchen omega-3 acid ethyl esters (LOVAZA) 1 G capsule Take 1 g by mouth daily.      . traMADol-acetaminophen (ULTRACET) 37.5-325 MG per tablet Take 1 tablet by mouth every 6 (six) hours as needed for moderate pain.      Marland Kitchen sulfamethoxazole-trimethoprim (BACTRIM DS) 800-160 MG per tablet Take 1 tablet by mouth 2 (two) times daily.  20 tablet  1    Results for orders placed during the hospital encounter of 06/10/13 (from the past 48 hour(s))  PROTIME-INR     Status: Abnormal   Collection Time    06/15/13  3:20 AM      Result Value Ref Range   Prothrombin Time 16.0 (*) 11.6 - 15.2 seconds   INR 1.31  0.00 - 1.49  CBC     Status: Abnormal   Collection  Time    06/15/13  3:20 AM      Result Value Ref Range   WBC 5.5  4.0 - 10.5 K/uL   RBC 2.80 (*) 4.22 - 5.81 MIL/uL   Hemoglobin 8.7 (*) 13.0 - 17.0 g/dL   HCT 25.5 (*) 39.0 - 52.0 %   MCV 91.1  78.0 - 100.0 fL   MCH 31.1  26.0 - 34.0 pg   MCHC 34.1  30.0 - 36.0 g/dL   RDW 13.9  11.5 - 15.5 %   Platelets 200  150 - 400 K/uL  CBC     Status: Abnormal   Collection Time    06/15/13  4:44 PM      Result Value Ref Range   WBC 5.8  4.0 - 10.5 K/uL   RBC 2.65 (*) 4.22 - 5.81 MIL/uL   Hemoglobin 8.1 (*) 13.0 - 17.0 g/dL   HCT 24.2 (*) 39.0 - 52.0 %   MCV 91.3  78.0 - 100.0 fL   MCH 30.6  26.0 - 34.0 pg   MCHC 33.5  30.0 - 36.0 g/dL   RDW 14.0  11.5 - 15.5 %   Platelets 213  150 - 400 K/uL  TYPE AND SCREEN     Status: None   Collection Time    06/15/13  5:00 PM      Result Value Ref Range   ABO/RH(D) A POS     Antibody Screen NEG     Sample Expiration 06/18/2013     Unit Number [id number]      Blood Component Type RED CELLS,LR     Unit division 00     Status of Unit ISSUED     Transfusion Status OK TO TRANSFUSE     Crossmatch Result Compatible    ABO/RH     Status: None   Collection Time    06/15/13  5:00 PM      Result Value Ref Range   ABO/RH(D) A POS    CBC     Status: Abnormal   Collection Time    06/15/13 11:15 PM      Result Value Ref Range   WBC 6.0  4.0 - 10.5 K/uL   RBC 2.46 (*) 4.22 - 5.81 MIL/uL   Hemoglobin 7.5 (*) 13.0 - 17.0 g/dL   HCT 22.4 (*) 39.0 - 52.0 %   MCV 91.1  78.0 - 100.0 fL   MCH 30.5  26.0 - 34.0 pg   MCHC 33.5  30.0 - 36.0 g/dL   RDW 13.9  11.5 - 15.5 %   Platelets 207  150 - 400 K/uL  PREPARE RBC (CROSSMATCH)     Status: None   Collection Time    06/16/13  1:27 AM      Result Value Ref Range   Order Confirmation ORDER PROCESSED BY BLOOD BANK     No results found.  Review of Systems  Constitutional: Negative for fever.  Respiratory: Negative for shortness of breath.   Cardiovascular: Negative for chest pain.  Gastrointestinal: Negative for nausea, vomiting and abdominal pain.  Neurological: Positive for weakness.  Psychiatric/Behavioral: Positive for memory loss.    Blood pressure 100/60, pulse 74, temperature 98.6 F (37 C), temperature source Oral, resp. rate 20, height 5\' 11"  (1.803 m), weight 74.5 kg (164 lb 3.9 oz), SpO2 99.00%. Physical Exam  Cardiovascular: Normal rate and regular rhythm.   No murmur heard. Respiratory: Effort normal and breath sounds normal. He has no wheezes.  GI: Soft. Bowel  sounds are normal. There is no tenderness.  Musculoskeletal:  Moves all 4s  Neurological: He is alert.  Pleasantly confused Unable to answer questions appropriately Slurred speech also  Skin: Skin is warm and dry.  Psychiatric:  Consented dtr over phone     Assessment/Plan Recent TURP--developed LLE DVT Coumadin/Lovenox--developed gross hematuria Now off anticoagulation Has had previous CVA--+weakness and high fall  risk Now scheduled for Retrievable IVC filter pts dtr aware of procedure benefits and risks and agreeable to prceed Consent signed and in chart  Arti Trang A 06/16/2013, 7:49 AM

## 2013-06-16 NOTE — Progress Notes (Signed)
Internal Medicine Attending  Date: 06/16/2013  Patient name: [name] Medical record number: [id number] Date of birth: 11/04/1924 Age: 80 y.o. Gender: male  I saw and evaluated the patient, and discussed his care on A.M rounds with housestaff.  I reviewed the resident's note by Dr. Eulas Post and I agree with the resident's findings and plans as documented in her note.

## 2013-06-16 NOTE — Progress Notes (Signed)
   I have seen the patient and reviewed the daily progress note by Bess Harvest, MS 4 and discussed the care of the patient with him.  See may separate note from 06/16/13 for documentation of my findings, assessment, and plans.    LOS: 6 days   Otho Bellows, MD 06/16/2013, 7:01 PM

## 2013-06-16 NOTE — Progress Notes (Signed)
   I have seen the patient and reviewed the daily progress note by Bess Harvest, MS 4 and discussed the care of the patient with him.  See my separate note from 06/12/13 for documentation of my findings, assessment, and plans.   Otho Bellows, MD 06/16/2013, 8:07 PM

## 2013-06-16 NOTE — Progress Notes (Signed)
PT Cancellation Note  Patient Details Name: [name] MRN: [id number] DOB: [birth year]   Cancelled Treatment:    Reason Eval/Treat Not Completed: Patient at procedure or test/unavailable   [name] 06/16/2013, 10:27 AM Alben Deeds, PT DPT  [phone]

## 2013-06-16 NOTE — Progress Notes (Signed)
Subjective: Overnight patient with persistent hematuria and Hgb drop to 7.5. Transfused 1 unit of pRBC and Hgb rose to 8.8 this morning. Persistent hematuria is improving according to Dr. Eulas Post, who saw the patient throughout the weekend.  IVC filter to be placed today and coagulation currently held.  Lower extremity pain and swelling has remitted. No reported chest pain, SOB, or abdominal pain.    Objective: Vital signs in last 24 hours: Filed Vitals:   06/16/13 1045 06/16/13 1145 06/16/13 1215 06/16/13 1310  BP: 105/63 121/67 106/52   Pulse: 77 81 71   Temp:    98.9 F (37.2 C)  TempSrc:      Resp: 18     Height:      Weight:      SpO2: 96% 93%     Weight change:   Intake/Output Summary (Last 24 hours) at 06/16/13 1444 Last data filed at 06/16/13 1423  Gross per 24 hour  Intake  48570 ml  Output  44451 ml  Net   4119 ml    Physical Exam:  General appearance: Alert and oriented x3. Patient is lying comfortably and in good spirits.  HEENT: Pupils are equal, round and reactive. Extra occular movements are intact. Cardiovascular: Regular rate and rhythm, normal S1S2, no murmurs/rubs/gallops appreciated.  Pulmonary: Normal work of breathing. Chest clear to auscultation bilaterally, no wheezes/rales/rhonchi.  Abdomen: Normoactive bowel sounds. Soft, non-tender/non-distended. Genitourinary: Catheter in place with oozing around site.  Extremities: Trace to no peripheral edema noted bilaterally. Stable areas of hypopigmentation. Pulses 2+ Skin: Skin color, texture, turgor normal. No rashes or lesions    Lab Results: CBC    Component Value Date/Time   WBC 6.4 06/16/2013 0700   RBC 2.86* 06/16/2013 0700   HGB 8.8* 06/16/2013 0700   HCT 25.9* 06/16/2013 0700   PLT 188 06/16/2013 0700   MCV 90.6 06/16/2013 0700   MCH 30.8 06/16/2013 0700   MCHC 34.0 06/16/2013 0700   RDW 14.2 06/16/2013 0700   LYMPHSABS 1.4 06/10/2013 1437   MONOABS 0.5 06/10/2013 1437   EOSABS 0.0 06/10/2013 1437   BASOSABS 0.0 06/10/2013 1437    Studies/Results: Ir Ivc Filter Plmt / S&i /img Guid/mod Sed  06/16/2013   CLINICAL DATA:  Left lower extremity DVT and gross hematuria after anticoagulation. Recent TURP. History of stroke and left sphenoid meningioma.  EXAM: 1. ULTRASOUND GUIDANCE FOR VASCULAR ACCESS OF THE RIGHT INTERNAL JUGULAR VEIN. 2. IVC VENOGRAM. 3. PERCUTANEOUS IVC FILTER PLACEMENT.  ANESTHESIA/SEDATION: 1.5 mg IV Versed; 50 mcg IV Fentanyl.  Total Moderate Sedation Time  71minutes.  CONTRAST:  29mL OMNIPAQUE IOHEXOL 300 MG/ML  SOLN  FLUOROSCOPY TIME:  35 seconds.  PROCEDURE: The procedure, risks, benefits, and alternatives were explained to the patient. Questions regarding the procedure were encouraged and answered. The patient understands and consents to the procedure.  The right neck was prepped with chlorhexidine in a sterile fashion, and a sterile drape was applied covering the operative field. A sterile gown and sterile gloves were used for the procedure. Local anesthesia was provided with 1% Lidocaine.  Ultrasound was used to confirm patency of the right internal jugular vein. Under direct ultrasound guidance, a 21 gauge needle was advanced into the right internal jugular vein with ultrasound image documentation performed. After securing access with a micropuncture dilator, a guidewire was advanced into the inferior vena cava. A deployment sheath was advanced over the guidewire. This was utilized to perform IVC venography.  The deployment sheath was further positioned in  an appropriate location for filter deployment. A Bard [place] IVC filter was then advanced in the sheath. This was then fully deployed in the infrarenal IVC. Final filter position was confirmed with a fluoroscopic spot image. Contrast injection was also performed through the sheath under fluoroscopy to confirm patency of the IVC at the level of the filter. After the procedure the sheath was removed and hemostasis obtained with manual  compression.  COMPLICATIONS: None.  FINDINGS: IVC venography demonstrates a normal caliber IVC with no evidence of thrombus. Renal veins are identified bilaterally. The IVC filter was successfully positioned below the level of the renal veins and is appropriately oriented. This IVC filter has both permanent and retrievable indications.  IMPRESSION: Placement of percutaneous IVC filter in infrarenal IVC. IVC venogram shows no evidence of IVC thrombus and normal caliber of the inferior vena cava. This filter does have both permanent and retrievable indications.   Electronically Signed   By: Aletta Edouard M.D.   On: 06/16/2013 11:02   Medications: I have reviewed the patient's current medications. Scheduled Meds: . atorvastatin  20 mg Oral q1800  . benazepril  20 mg Oral q morning - 10a  . cholecalciferol  1,000 Units Oral Daily  . fentaNYL      . metoprolol tartrate  25 mg Oral BID  . midazolam      . omega-3 acid ethyl esters  1 g Oral Daily  . sodium chloride  3 mL Intravenous Q12H   Continuous Infusions: . sodium chloride irrigation     PRN Meds:.  Assessment/Plan:   **Acute LLE DVT - Patient presented with acute left LE swelling and pain s/p a TURP procedure (05/19/13). On presentation pain and swelling had progressed to the hip. Bilateral duplexes were obtained revealing left lower extremity DVT of the saphenofemoral junction, common femoral, profunda, femoral, popliteal, posterior tibial, and peroneal vein.  Lovenox and compression stocking initially started (4/1) and then bridged to Coumadin (4/2). His edema and pain resolved but hematuria began during this period of anticoagulation. Due to persistent hematuria, and H&H down-trend, urology recommended discontinuing the anticoagulation and playing IVC filter.  - IVC filter to be placed by IR today - Compression stockings in place    **Hematuria - Patient had no gross hematuria on admission. Gross hematuria began after Lovenox initiation,  likely from recent TURPS procedure.  Urology consulted and CBI initiated. Due to persistence, urology recommended IVC filter and discontinuing anticoagulation to stop bleeding (anticoagulation now held).  IR to place IVC filter today. - Continue to monitor CBC - Will follow-up with Urology    ** Acute on Chronic Normocytic Anemia - Patient presented with hemoglobin near baseline of 12. In the setting of hematuria and anticoagulation therapy, hemoglobin dropped to 9.6 (4/1). Hematuria persisted despite CBI and Hgb dropped to 7.5 leading to a 1 unit pRBC transfusion. Will continue to monitor. -Continue to serially follow H&H  ** Frequent falls in setting of Cerebellar Stroke - Patient had a CVA in 2013 which led to frequent falls and the requirement of walker use. He is currently at baseline neurological status.  -Obtained PT/OT consult   ** Hypertension - Hypertensive on presentation, currently normotensive. Continue to monitor.  -Continued home benazepril 20 mg daily and home metoprolol tartrate 25 mg BID   ** Chronic Hypercalcemia - Patient with chronic elevated calcium levels and PTH on 04/24/13 that was high-normal (47.3), most likely due to primary hyperparathyroidism. Patient also with history of kidney stones. He takes cholecalciferol 1000  U daily at home. Calcium normalized to 9.9 during hospitalization.  - PTHrP level obtained to rule out hypercalcemia due to malignancy.  - Obtained albumin to calculate corrected calcium  - Obtained 25-OH Vitamin D and 1,25 OH-Vitamin D levels  - Consider UPEP, SPEP if other tests negative or work this up as an outpatient   ** Chronic Kidney Disease Stage 2 - CKD stable at presentation. Creatinine near baseline of 1.0 with normal urine output.  -Monitor renal function and volume status.   ** Hyperlipidemia - Last lipid panel on 07/20/11 (normal).  -Continued home atorvastatin 20 mg daily   Diet: Heart  Code: Full   Dispo: Social Work is handling  possibility of SNF placement at discharge. Patient will see PCP Joni Reining, DO) for follow-up appointment. Patient does have transportation limitations that hinder transportation to clinic appointments  This is a Careers information officer Note.  The care of the patient was discussed with Dr. Eulas Post and the assessment and plan formulated with their assistance.  Please see their attached note for official documentation of the daily encounter.   LOS: 6 days   Isac Sarna, Med Student 06/16/2013, 2:44 PM

## 2013-06-16 NOTE — Progress Notes (Addendum)
I stopped by to see patient today.  His urine is still bloody on slow-moderate CBI drip rate.  Patient is s/p IVC filter.  Coumadin and lovenox held today.  Hopeful that hematuria will clear.  Continue CBI - wean to off, titrate to pink.  Once it clears he should be able to have a voiding trial.  I have sent a urine culture to ensure that he doesn't have an untreated infection - which can perpetuate bleeding...  Dr. Gaynelle Arabian is off this week.  I will be covering for him.

## 2013-06-16 NOTE — Procedures (Signed)
Procedure:  IVC filter placement. Access:  Right IJ vein Findings:  Normal caliber IVC showing normal patency.  Bard [place] IVC filter deployed in infrarenal IVC.

## 2013-06-16 NOTE — H&P (Signed)
Agree.  For IVC filter placement. 

## 2013-06-16 NOTE — Progress Notes (Signed)
CSW continuing to follow patient for dc to SNF once medically more stable.  Jeanette Caprice, MSW, [place]

## 2013-06-16 NOTE — Progress Notes (Signed)
Subjective: Still with hematuria and some oozing from around the catheter, but seems improved. Pt denies pain. Edema resolved. Still performing CBI per Urology. Plavix continues to be held, Coumadin held overnight and last dose of Lovenox last night. IVC filter placement with IR today. Discussed with daughter. Hgb dropped overnight, tx 1u PRBC with good response.   Objective: Vital signs in last 24 hours: Filed Vitals:   06/16/13 0249 06/16/13 0345 06/16/13 0445 06/16/13 0535  BP: 104/57 112/57 108/60 100/60  Pulse: 70 72 76 74  Temp: 98.4 F (36.9 C) 98.9 F (37.2 C) 98.7 F (37.1 C) 98.6 F (37 C)  TempSrc: Oral Oral Oral Oral  Resp: 20 20 20 20   Height:      Weight:      SpO2: 94% 94% 99% 99%   Weight change:   Intake/Output Summary (Last 24 hours) at 06/16/13 0858 Last data filed at 06/16/13 0844  Gross per 24 hour  Intake  45690 ml  Output  40051 ml  Net   5639 ml   Vitals reviewed. General: Lying in bed sleeping, NAD HEENT: PERRL, EOMI Cardiac: RRR, no rubs, murmurs or gallops Pulm: Clear to auscultation bilaterally, no wheezes, rales, or rhonchi Abd: Soft, nontender, non-distended GU: Catheter in place with hematuria, clearing; serosanguinous drainage from around the penis Ext: Warm and well perfused. LLE with no appreciable edema; no erythema. TED hose in place bilaterally. Decreased pain in the with strong DP pulses in BLE. Moves all 4 extremities. Neuro: Very hard of hearing. Alert and oriented to person, cranial nerves II-XII grossly intact  Lab Results: Basic Metabolic Panel:  Recent Labs Lab 06/10/13 1437  06/12/13 0530 06/14/13 0502  NA 143  < > 139 143  K 4.7  < > 3.9 3.8  CL 106  < > 103 105  CO2 26  < > 24 27  GLUCOSE 113*  < > 96 102*  BUN 23  < > 20 17  CREATININE 1.13  < > 1.10 1.06  CALCIUM 11.4*  < > 9.9 9.9  MG 2.0  --   --   --   < > = values in this interval not displayed. CBC:  Recent Labs Lab 06/10/13 1437  06/15/13 2315  06/16/13 0700  WBC 7.2  < > 6.0 6.4  NEUTROABS 5.3  --   --   --   HGB 11.6*  < > 7.5* 8.8*  HCT 35.1*  < > 22.4* 25.9*  MCV 92.4  < > 91.1 90.6  PLT 181  < > 207 188  < > = values in this interval not displayed. Coagulation:  Recent Labs Lab 06/13/13 0430 06/14/13 0502 06/15/13 0320 06/16/13 0700  LABPROT 15.2 14.5 16.0* 17.0*  INR 1.23 1.15 1.31 1.42   Urinalysis:  Recent Labs Lab 06/11/13 0323  COLORURINE AMBER*  LABSPEC 1.015  PHURINE 8.0  GLUCOSEU NEGATIVE  HGBUR LARGE*  BILIRUBINUR NEGATIVE  KETONESUR NEGATIVE  PROTEINUR 30*  UROBILINOGEN 1.0  NITRITE NEGATIVE  LEUKOCYTESUR LARGE*    Micro Results: No results found for this or any previous visit (from the past 240 hour(s)).  Studies/Results: No results found.  Medications: I have reviewed the patient's current medications. Scheduled Meds: . atorvastatin  20 mg Oral q1800  . benazepril  20 mg Oral q morning - 10a  . cholecalciferol  1,000 Units Oral Daily  . metoprolol tartrate  25 mg Oral BID  . omega-3 acid ethyl esters  1 g Oral Daily  .  sodium chloride  3 mL Intravenous Q12H   Continuous Infusions: . sodium chloride irrigation     PRN Meds:.  Assessment/Plan: 80yo M w/ PMH HTN, BPH s/p TURP on 05/19/13, cerebellar stroke '13, CKD Stage II, and HLD who presented with acute LLE edema and pain and was found to have large clot burden in her LLE venous system and now has persistent gross hematuria on anticoagulation.   #Acute LLE DVT: Pt s/p TURPs on 05/19/13 for BPH. He was brought to the ED on 3/31 with significant edema and pain. BLE dopplers were ordered and reveled a large clot burden throughout the left leg. Pt on Lovenox and Coumadin; INR remains subtherapetic. His edema and pain are resolved; unfortunately he continues having hematuria despite catheter placement and continuous bladder irrigation. IVC filter today with IR. - F/u with IR, IVC filter placement today - Compression stockings    #Hematuria: Pt with no gross hematuria on admission. Lovenox started on admission for DVT. Unfortunatley gross hematuria began after starting the Lovenox, likely from the prostate. Urology consutled and placed catheter and started CBI. Hematuria improved initially but worsened overnight and CBI restarted. Hgb decreased this morning. Holding Plavix, Lovenox, and Coumadin. IVC filter today with IR. - CBC q8 - F/u w/ Urology, appreciate recommendations.   #Acute on chronic anemia, normocytic: Stable. Baseline Hgb around 12. H/o TURPs 3/9 with hematuria that has resolved per the daughter. No gross hematuria on admission. Hgb 11.6 this admission, 12.2 prior to procedure. Lovenox started on admission and Hgb dropped to 9.6 --> 8.7 today. D/c'd the Plavix, Lovenox and Coumadin. Hgb dropped overnight and he was transfused 1u pRBCs with good response.  Recent Labs Lab 06/14/13 0502 06/15/13 0320 06/15/13 1644 06/15/13 2315 06/16/13 0700  HGB 9.3* 8.7* 8.1* 7.5* 8.8*   - q8h CBC  #Hypercalcemia: Ca this admission elevated to 11.4 with previously elevated levels. PTH normal in Feb,and Ca normalized to 9.9. Checking Vit D levels, and PTH-related protein ordered to help determine if his hypercalcemia could be related to malignancy. If this is normal and Ca remains normal, he will need to have his calcium monitored as an out patient and will need SPEP and UPEP checked. - PTH- related protein pending - Vit D levels pending  #HTN: Stable. BP well controlled on home lopressor and benazepril  - Lopressor 25mg  BID - Benazepril 20mg  daily  #Frequent falls in setting of Cerebellar Stroke: H/o CVA in 2013 which has led to frequent falls and the requirement of a walker. He is currently at baseline neurological status. PT/OT consulted in the hospital and rec SNF placement. - Continue PT - SNF at d/c  #Chronic Kidney Disease, Stage 2: Stable. Creatinine near baseline of 1.0.   Recent Labs Lab  06/10/13 1437 06/11/13 0315 06/12/13 0530 06/14/13 0502  CREATININE 1.13 1.12 1.10 1.06    #Hyperlipidemia: Stable. Last lipid panel normal on 07/20/11.  - Continuing home atorvastatin 20 mg daily    Dispo: Disposition is deferred at this time, awaiting improvement of current medical problems.  Will d/c to SNF once ready, hopefully in a few days.   The patient does have a current PCP Joni Reining, DO) and does need an [facility] hospital follow-up appointment after discharge.  The patient does not have transportation limitations that hinder transportation to clinic appointments.  .Services Needed at time of discharge: Y = Yes, Blank = No PT:   OT:   RN:   Equipment:   Other:  LOS: 6 days   Otho Bellows, MD 06/16/2013, 8:58 AM

## 2013-06-17 DIAGNOSIS — Z8673 Personal history of transient ischemic attack (TIA), and cerebral infarction without residual deficits: Secondary | ICD-10-CM

## 2013-06-17 LAB — CBC
HCT: 21.9 % — ABNORMAL LOW (ref 39.0–52.0)
HCT: 23.4 % — ABNORMAL LOW (ref 39.0–52.0)
HCT: 24 % — ABNORMAL LOW (ref 39.0–52.0)
HCT: 24.4 % — ABNORMAL LOW (ref 39.0–52.0)
Hemoglobin: 7.4 g/dL — ABNORMAL LOW (ref 13.0–17.0)
Hemoglobin: 7.8 g/dL — ABNORMAL LOW (ref 13.0–17.0)
Hemoglobin: 7.9 g/dL — ABNORMAL LOW (ref 13.0–17.0)
Hemoglobin: 8.2 g/dL — ABNORMAL LOW (ref 13.0–17.0)
MCH: 30.2 pg (ref 26.0–34.0)
MCH: 30.5 pg (ref 26.0–34.0)
MCH: 30.9 pg (ref 26.0–34.0)
MCH: 31.2 pg (ref 26.0–34.0)
MCHC: 32.9 g/dL (ref 30.0–36.0)
MCHC: 33.3 g/dL (ref 30.0–36.0)
MCHC: 33.6 g/dL (ref 30.0–36.0)
MCHC: 33.8 g/dL (ref 30.0–36.0)
MCV: 91.4 fL (ref 78.0–100.0)
MCV: 91.6 fL (ref 78.0–100.0)
MCV: 92.1 fL (ref 78.0–100.0)
MCV: 92.4 fL (ref 78.0–100.0)
Platelets: 189 10*3/uL (ref 150–400)
Platelets: 195 10*3/uL (ref 150–400)
Platelets: 198 10*3/uL (ref 150–400)
Platelets: 199 10*3/uL (ref 150–400)
RBC: 2.37 MIL/uL — ABNORMAL LOW (ref 4.22–5.81)
RBC: 2.56 MIL/uL — ABNORMAL LOW (ref 4.22–5.81)
RBC: 2.62 MIL/uL — ABNORMAL LOW (ref 4.22–5.81)
RBC: 2.65 MIL/uL — ABNORMAL LOW (ref 4.22–5.81)
RDW: 14.6 % (ref 11.5–15.5)
RDW: 14.7 % (ref 11.5–15.5)
RDW: 14.7 % (ref 11.5–15.5)
RDW: 14.8 % (ref 11.5–15.5)
WBC: 6.2 10*3/uL (ref 4.0–10.5)
WBC: 6.5 10*3/uL (ref 4.0–10.5)
WBC: 6.6 10*3/uL (ref 4.0–10.5)
WBC: 7.2 10*3/uL (ref 4.0–10.5)

## 2013-06-17 LAB — PROTIME-INR
INR: 1.2 (ref 0.00–1.49)
Prothrombin Time: 14.9 seconds (ref 11.6–15.2)

## 2013-06-17 MED ORDER — ACETAMINOPHEN 325 MG PO TABS
650.0000 mg | ORAL_TABLET | Freq: Four times a day (QID) | ORAL | Status: DC | PRN
  Administered 2013-06-17: 650 mg via ORAL
  Filled 2013-06-17: qty 2

## 2013-06-17 NOTE — Progress Notes (Addendum)
Subjective:    Patient is alert, hard of hearing, has no acute complaints.  He says that he is feeling better    Objective:    Vital Signs:   Temp:  [97.9 F (36.6 C)-98 F (36.7 C)] 97.9 F (36.6 C) (04/07 1330) Pulse Rate:  [67-78] 67 (04/07 1330) Resp:  [17-18] 18 (04/07 1330) BP: (103-110)/(56-59) 107/58 mmHg (04/07 1330) SpO2:  [94 %-95 %] 95 % (04/07 1330) Last BM Date: 06/17/13     Physical Exam: General: Alert, no distress Lungs: Clear Heart: Regular; no extra sounds or murmurs Abdomen: Bowel sounds present, soft, nontender Extremities: No edema; no tenderness   Labs:  Basic Metabolic Panel:  Recent Labs Lab 06/11/13 0315 06/12/13 0530 06/14/13 0502  NA 141 139 143  K 4.3 3.9 3.8  CL 105 103 105  CO2 25 24 27   GLUCOSE 107* 96 102*  BUN 24* 20 17  CREATININE 1.12 1.10 1.06  CALCIUM 10.9* 9.9 9.9    Liver Function Tests:  Recent Labs Lab 06/13/13 0430  ALBUMIN 2.0*    CBC:  Recent Labs Lab 06/16/13 0700 06/16/13 1535 06/16/13 2350 06/17/13 0535 06/17/13 1455  WBC 6.4 6.3 6.6 7.2 6.5  HGB 8.8* 8.6* 7.8* 7.9* 8.2*  HCT 25.9* 26.2* 23.4* 24.0* 24.4*  MCV 90.6 91.6 91.4 91.6 92.1  PLT 188 192 198 195 199     Coagulation Studies:  Recent Labs  06/15/13 0320 06/16/13 0700 06/17/13 0535  LABPROT 16.0* 17.0* 14.9  INR 1.31 1.42 1.20     Imaging: Ir Ivc Filter Plmt / S&i /img Guid/mod Sed  06/16/2013   CLINICAL DATA:  Left lower extremity DVT and gross hematuria after anticoagulation. Recent TURP. History of stroke and left sphenoid meningioma.  EXAM: 1. ULTRASOUND GUIDANCE FOR VASCULAR ACCESS OF THE RIGHT INTERNAL JUGULAR VEIN. 2. IVC VENOGRAM. 3. PERCUTANEOUS IVC FILTER PLACEMENT.  ANESTHESIA/SEDATION: 1.5 mg IV Versed; 50 mcg IV Fentanyl.  Total Moderate Sedation Time  82minutes.  CONTRAST:  86mL OMNIPAQUE IOHEXOL 300 MG/ML  SOLN  FLUOROSCOPY TIME:  35 seconds.  PROCEDURE: The procedure, risks, benefits, and alternatives were  explained to the patient. Questions regarding the procedure were encouraged and answered. The patient understands and consents to the procedure.  The right neck was prepped with chlorhexidine in a sterile fashion, and a sterile drape was applied covering the operative field. A sterile gown and sterile gloves were used for the procedure. Local anesthesia was provided with 1% Lidocaine.  Ultrasound was used to confirm patency of the right internal jugular vein. Under direct ultrasound guidance, a 21 gauge needle was advanced into the right internal jugular vein with ultrasound image documentation performed. After securing access with a micropuncture dilator, a guidewire was advanced into the inferior vena cava. A deployment sheath was advanced over the guidewire. This was utilized to perform IVC venography.  The deployment sheath was further positioned in an appropriate location for filter deployment. A Bard Denali IVC filter was then advanced in the sheath. This was then fully deployed in the infrarenal IVC. Final filter position was confirmed with a fluoroscopic spot image. Contrast injection was also performed through the sheath under fluoroscopy to confirm patency of the IVC at the level of the filter. After the procedure the sheath was removed and hemostasis obtained with manual compression.  COMPLICATIONS: None.  FINDINGS: IVC venography demonstrates a normal caliber IVC with no evidence of thrombus. Renal veins are identified bilaterally. The IVC filter was successfully positioned below the level  of the renal veins and is appropriately oriented. This IVC filter has both permanent and retrievable indications.  IMPRESSION: Placement of percutaneous IVC filter in infrarenal IVC. IVC venogram shows no evidence of IVC thrombus and normal caliber of the inferior vena cava. This filter does have both permanent and retrievable indications.   Electronically Signed   By: Aletta Edouard M.D.   On: 06/16/2013 11:02        Medications:    Infusions: . sodium chloride irrigation      Scheduled Medications: . atorvastatin  20 mg Oral q1800  . benazepril  20 mg Oral q morning - 10a  . cholecalciferol  1,000 Units Oral Daily  . metoprolol tartrate  25 mg Oral BID  . omega-3 acid ethyl esters  1 g Oral Daily  . sodium chloride  3 mL Intravenous Q12H    PRN Medications: acetaminophen   Assessment/ Plan:    1.  Left lower extremity DVT.  Patient is status post placement of an IVC filter on 06/16/2013.  Warfarin and heparin were stopped because of gross hematuria attributed to recent TURP.  His left lower extremity edema has improved significantly since admission.  Once urology feels it is safe to resume anticoagulation, this can be considered.  2.  Gross hematuria status post TURP.  This appears to be clearing to some extent.  The plan is continue continuous bladder irrigation as per urology until hematuria clears.  3. Normocytic anemia with acute decline in hemoglobin due to acute blood loss from hematuria.  Hemoglobin is stable today; plan is continue to monitor hemoglobin, transfuse if indicated.  Needs workup including iron studies, vitamin B12, and folate level.  4.  Hypercalcemia with recent normal PTH.  PTHrP is pending; would recheck calcium level and if elevated send SPEP/UPEP.

## 2013-06-17 NOTE — Progress Notes (Signed)
Physical Therapy Treatment Patient Details Name: [name] MRN: [id number] DOB: 07/18/1924 Today's Date: 06/17/2013    History of Present Illness Pt with IVC filter 06/16/2013    PT Comments    Pt participated well with PT. He wil benefit from continued PT at SNF  Follow Up Recommendations  SNF;Supervision/Assistance - 24 hour     Equipment Recommendations  None recommended by PT    Recommendations for Other Services       Precautions / Restrictions Precautions Precautions: Fall    Mobility  Bed Mobility Overal bed mobility: Needs Assistance Bed Mobility: Rolling;Sidelying to Sit;Sit to Sidelying Rolling: Mod assist Sidelying to sit: Mod assist     Sit to sidelying: Mod assist General bed mobility comments: pt using rail to help with rolling. He needed assist to pull pad over to edge of bed to assist with sitting  Transfers Overall transfer level: Needs assistance Equipment used: Rolling walker (2 wheeled)   Sit to Stand: Mod assist;From elevated surface         General transfer comment: pt needed assist for initial lift off to come to standing and to find and maintain midline ballance  Ambulation/Gait Ambulation/Gait assistance: Mod assist Ambulation Distance (Feet): 15 Feet Assistive device: Rolling walker (2 wheeled) Gait Pattern/deviations: Step-through pattern;Decreased step length - right;Decreased step length - left;Trunk flexed;Decreased stride length;Shuffle Gait velocity: decreased   General Gait Details: pt asked to have his shoes on and was able to walk better with them on.    Stairs            Wheelchair Mobility    Modified Rankin (Stroke Patients Only)       Balance Overall balance assessment: Needs assistance Sitting-balance support: Bilateral upper extremity supported Sitting balance-Leahy Scale: Good Sitting balance - Comments: pt was able to sit on the edge of the bed without assist   Standing balance support:  Bilateral upper extremity supported Standing balance-Leahy Scale: Good (with rolling walker)                      Cognition Arousal/Alertness: Awake/alert Behavior During Therapy: WFL for tasks assessed/performed Overall Cognitive Status: Difficult to assess                      Exercises General Exercises - Lower Extremity Ankle Circles/Pumps: AROM;Both;5 reps;Seated Long Arc Quad: AROM;Both;10 reps;Seated Hip Flexion/Marching: AROM;Both;10 reps Other Exercises Other Exercises: repeated sit to stand x 3 with 30 seconds standing balance    General Comments        Pertinent Vitals/Pain Pt indicated pain in right knee after ambulation did not rate    Home Living                      Prior Function            PT Goals (current goals can now be found in the care plan section) Progress towards PT goals: Progressing toward goals    Frequency       PT Plan Current plan remains appropriate    Co-evaluation             End of Session Equipment Utilized During Treatment: Gait belt Activity Tolerance: Patient limited by fatigue Patient left: in chair;with call bell/phone within reach;with chair alarm set     Time: 1140-1215 PT Time Calculation (min): 35 min  Charges:  $Gait Training: 8-22 mins $Therapeutic Activity: 8-22 mins  G Codes:      Norwood Levo 06/17/2013, 1:56 PM

## 2013-06-17 NOTE — Progress Notes (Signed)
See attending progress note.

## 2013-06-17 NOTE — Progress Notes (Signed)
Subjective: Patient's hematuria is improving, with pinkish fluid collecting in the irrigation bag and clear fluid in the catheter. Urology will see him and decide when to wean off the CBI and when to get the voiding trial. Otherwise, patient is doing well with no chest pain, shortness of breath, or abdominal pain.  Objective: Vital signs in last 24 hours: Filed Vitals:   06/16/13 2013 06/17/13 0352 06/17/13 0942 06/17/13 1330  BP: 103/58 110/59 103/56 107/58  Pulse: 78 74 77 67  Temp: 98 F (36.7 C) 98 F (36.7 C)  97.9 F (36.6 C)  TempSrc: Oral Oral  Oral  Resp: 17 17  18   Height:      Weight:      SpO2: 95% 94%  95%   Weight change:   Intake/Output Summary (Last 24 hours) at 06/17/13 1431 Last data filed at 06/17/13 1401  Gross per 24 hour  Intake  36240 ml  Output  33675 ml  Net   2565 ml   Physical Exam: General appearance: Alert and oriented x3. Patient is lying comfortably and in good spirits.  HEENT: Pupils are equal, round and reactive. Extra occular movements are intact.  Cardiovascular: Regular rate and rhythm, normal S1S2, no murmurs/rubs/gallops appreciated.  Pulmonary: Normal work of breathing. Chest clear to auscultation bilaterally, no wheezes/rales/rhonchi.  Abdomen: Normoactive bowel sounds. Soft, non-tender/non-distended.  Genitourinary: Catheter in place and no longer oozing around site.  Extremities: Trace to no peripheral edema noted bilaterally. Stable areas of hypopigmentation. Pulses 2+  Skin: Skin color, texture, turgor normal. No rashes or lesions   Lab Results: CBC    Component Value Date/Time   WBC 7.2 06/17/2013 0535   RBC 2.62* 06/17/2013 0535   HGB 7.9* 06/17/2013 0535   HCT 24.0* 06/17/2013 0535   PLT 195 06/17/2013 0535   MCV 91.6 06/17/2013 0535   MCH 30.2 06/17/2013 0535   MCHC 32.9 06/17/2013 0535   RDW 14.6 06/17/2013 0535   LYMPHSABS 1.4 06/10/2013 1437   MONOABS 0.5 06/10/2013 1437   EOSABS 0.0 06/10/2013 1437   BASOSABS 0.0 06/10/2013 1437      Studies/Results: Ir Ivc Filter Plmt / S&i /img Guid/mod Sed  06/16/2013   CLINICAL DATA:  Left lower extremity DVT and gross hematuria after anticoagulation. Recent TURP. History of stroke and left sphenoid meningioma.  EXAM: 1. ULTRASOUND GUIDANCE FOR VASCULAR ACCESS OF THE RIGHT INTERNAL JUGULAR VEIN. 2. IVC VENOGRAM. 3. PERCUTANEOUS IVC FILTER PLACEMENT.  ANESTHESIA/SEDATION: 1.5 mg IV Versed; 50 mcg IV Fentanyl.  Total Moderate Sedation Time  98minutes.  CONTRAST:  62mL OMNIPAQUE IOHEXOL 300 MG/ML  SOLN  FLUOROSCOPY TIME:  35 seconds.  PROCEDURE: The procedure, risks, benefits, and alternatives were explained to the patient. Questions regarding the procedure were encouraged and answered. The patient understands and consents to the procedure.  The right neck was prepped with chlorhexidine in a sterile fashion, and a sterile drape was applied covering the operative field. A sterile gown and sterile gloves were used for the procedure. Local anesthesia was provided with 1% Lidocaine.  Ultrasound was used to confirm patency of the right internal jugular vein. Under direct ultrasound guidance, a 21 gauge needle was advanced into the right internal jugular vein with ultrasound image documentation performed. After securing access with a micropuncture dilator, a guidewire was advanced into the inferior vena cava. A deployment sheath was advanced over the guidewire. This was utilized to perform IVC venography.  The deployment sheath was further positioned in an appropriate location for  filter deployment. A Bard Denali IVC filter was then advanced in the sheath. This was then fully deployed in the infrarenal IVC. Final filter position was confirmed with a fluoroscopic spot image. Contrast injection was also performed through the sheath under fluoroscopy to confirm patency of the IVC at the level of the filter. After the procedure the sheath was removed and hemostasis obtained with manual compression.  COMPLICATIONS:  None.  FINDINGS: IVC venography demonstrates a normal caliber IVC with no evidence of thrombus. Renal veins are identified bilaterally. The IVC filter was successfully positioned below the level of the renal veins and is appropriately oriented. This IVC filter has both permanent and retrievable indications.  IMPRESSION: Placement of percutaneous IVC filter in infrarenal IVC. IVC venogram shows no evidence of IVC thrombus and normal caliber of the inferior vena cava. This filter does have both permanent and retrievable indications.   Electronically Signed   By: Aletta Edouard M.D.   On: 06/16/2013 11:02   Medications: I have reviewed the patient's current medications. Scheduled Meds: . atorvastatin  20 mg Oral q1800  . benazepril  20 mg Oral q morning - 10a  . cholecalciferol  1,000 Units Oral Daily  . metoprolol tartrate  25 mg Oral BID  . omega-3 acid ethyl esters  1 g Oral Daily  . sodium chloride  3 mL Intravenous Q12H   Continuous Infusions: . sodium chloride irrigation     PRN Meds:.acetaminophen  Assessment/Plan:  **Acute LLE DVT - Patient presented with acute left LE swelling and pain s/p a TURP procedure (05/19/13). On presentation pain and swelling had progressed to the hip. Bilateral duplexes were obtained revealing left lower extremity DVT of the saphenofemoral junction, common femoral, profunda, femoral, popliteal, posterior tibial, and peroneal vein. Lovenox and compression stocking initially started (4/1) and then bridged to Coumadin (4/2). His edema and pain resolved but hematuria began during this period of anticoagulation. Due to persistent hematuria, and H&H down-trend, urology recommended discontinuing the anticoagulation and playing IVC filter. IVC filter placed (4/7). Would like to continue anticoagulation once bleeding remits. - Compression stockings in place   **Hematuria - Patient had no gross hematuria on admission. Gross hematuria began after Lovenox initiation, likely  from recent TURPS procedure. Urology consulted and CBI initiated. Due to persistence, urology recommended IVC filter and discontinuing anticoagulation to stop bleeding (anticoagulation now held). IVC filter placed (4/7).  - Continue to monitor CBC  - Urology following, thank you for recommendations  ** Acute on Chronic Normocytic Anemia - Patient presented with hemoglobin near baseline of 12. In the setting of hematuria and anticoagulation therapy, hemoglobin dropped to 9.6 (4/1). Hematuria persisted despite CBI and Hgb dropped to 7.5 leading to a 1 unit pRBC transfusion (Hgb up to 8.8). Will continue to monitor.  -Continue to serially follow H&H q8h (if H&H stable tonight may change to daily)  ** Frequent falls in setting of Cerebellar Stroke - Patient had a CVA in 2013 which led to frequent falls and the requirement of walker use. He is currently at baseline neurological status.  -Obtained PT/OT consult   ** Hypertension - Hypertensive on presentation, currently normotensive. Continue to monitor.  -Continued home benazepril 20 mg daily and home metoprolol tartrate 25 mg BID   ** Chronic Hypercalcemia - Patient with chronic elevated calcium levels and PTH on 04/24/13 that was high-normal (47.3), most likely due to primary hyperparathyroidism. Patient also with history of kidney stones. He takes cholecalciferol 1000 U daily at home. Calcium normalized to  9.9 during hospitalization. Albumin found to be 2.0, thus corrected calcium is 11.5.  - PTHrP level obtained to rule out hypercalcemia due to malignancy.  - Obtained 25-OH Vitamin D (normal) and 1,25 OH-Vitamin D levels  - Consider UPEP, SPEP if other tests negative or work this up as an outpatient   ** Chronic Kidney Disease Stage 2 - CKD stable at presentation. Creatinine near baseline of 1.0 with normal urine output.  -Monitor renal function and volume status.   ** Hyperlipidemia - Last lipid panel on 07/20/11 (normal).  -Continued home  atorvastatin 20 mg daily    Diet: Heart  Code: Full   Dispo: Social Work is handling possibility of SNF placement at discharge. Patient will see PCP Joni Reining, DO) for follow-up appointment. Patient does have transportation limitations that hinder transportation to clinic appointments   This is a Careers information officer Note.  The care of the patient was discussed with Dr. Marinda Elk and the assessment and plan formulated with their assistance.  Please see their attached note for official documentation of the daily encounter.   LOS: 7 days   Isac Sarna, Med Student 06/17/2013, 2:31 PM

## 2013-06-18 LAB — CBC
HCT: 21.9 % — ABNORMAL LOW (ref 39.0–52.0)
HCT: 24.2 % — ABNORMAL LOW (ref 39.0–52.0)
HCT: 24.4 % — ABNORMAL LOW (ref 39.0–52.0)
Hemoglobin: 7.3 g/dL — ABNORMAL LOW (ref 13.0–17.0)
Hemoglobin: 8.2 g/dL — ABNORMAL LOW (ref 13.0–17.0)
Hemoglobin: 8.2 g/dL — ABNORMAL LOW (ref 13.0–17.0)
MCH: 30.7 pg (ref 26.0–34.0)
MCH: 30.8 pg (ref 26.0–34.0)
MCH: 31.3 pg (ref 26.0–34.0)
MCHC: 33.3 g/dL (ref 30.0–36.0)
MCHC: 33.6 g/dL (ref 30.0–36.0)
MCHC: 33.9 g/dL (ref 30.0–36.0)
MCV: 91.7 fL (ref 78.0–100.0)
MCV: 92 fL (ref 78.0–100.0)
MCV: 92.4 fL (ref 78.0–100.0)
Platelets: 174 10*3/uL (ref 150–400)
Platelets: 181 10*3/uL (ref 150–400)
Platelets: 197 10*3/uL (ref 150–400)
RBC: 2.38 MIL/uL — ABNORMAL LOW (ref 4.22–5.81)
RBC: 2.62 MIL/uL — ABNORMAL LOW (ref 4.22–5.81)
RBC: 2.66 MIL/uL — ABNORMAL LOW (ref 4.22–5.81)
RDW: 14.7 % (ref 11.5–15.5)
RDW: 14.7 % (ref 11.5–15.5)
RDW: 14.8 % (ref 11.5–15.5)
WBC: 5.6 10*3/uL (ref 4.0–10.5)
WBC: 5.9 10*3/uL (ref 4.0–10.5)
WBC: 6.1 10*3/uL (ref 4.0–10.5)

## 2013-06-18 LAB — BASIC METABOLIC PANEL
BUN: 14 mg/dL (ref 6–23)
CO2: 28 mEq/L (ref 19–32)
Calcium: 10.1 mg/dL (ref 8.4–10.5)
Chloride: 107 mEq/L (ref 96–112)
Creatinine, Ser: 1.11 mg/dL (ref 0.50–1.35)
GFR calc Af Amer: 67 mL/min — ABNORMAL LOW (ref >90)
GFR calc non Af Amer: 58 mL/min — ABNORMAL LOW (ref >90)
Glucose, Bld: 101 mg/dL — ABNORMAL HIGH (ref 70–99)
Potassium: 3.9 mEq/L (ref 3.7–5.3)
Sodium: 143 mEq/L (ref 137–147)

## 2013-06-18 LAB — PREPARE RBC (CROSSMATCH)

## 2013-06-18 LAB — PROTIME-INR
INR: 1.32 (ref 0.00–1.49)
Prothrombin Time: 16.1 seconds — ABNORMAL HIGH (ref 11.6–15.2)

## 2013-06-18 LAB — URINE CULTURE
Colony Count: NO GROWTH
Culture: NO GROWTH

## 2013-06-18 LAB — VITAMIN D 1,25 DIHYDROXY
Vitamin D 1, 25 (OH)2 Total: 18 pg/mL (ref 18–72)
Vitamin D2 1, 25 (OH)2: <8 pg/mL
Vitamin D3 1, 25 (OH)2: 18 pg/mL

## 2013-06-18 LAB — PTH-RELATED PEPTIDE: PTH-related peptide: 14 pg/mL (ref 14–27)

## 2013-06-18 MED ORDER — SODIUM CHLORIDE 0.9 % IV SOLN
INTRAVENOUS | Status: DC
  Administered 2013-06-18 – 2013-06-19 (×3): via INTRAVENOUS

## 2013-06-18 NOTE — Progress Notes (Signed)
Internal Medicine Attending  Date: 06/18/2013  Patient name: [name] Medical record number: [id number] Date of birth: October 22, 1924 Age: 80 y.o. Gender: male  I saw and evaluated the patient, and discussed his care on A.M rounds with housestaff.  I reviewed the resident's note by Dr. Eulas Post and I agree with the resident's findings and plans as documented in her note.

## 2013-06-18 NOTE — Progress Notes (Signed)
CBI off for ~1 hr, urine has darkened somewhat.  I spoke to nurse, we will leave it off and see if it clears over time.  I explained to her when to resume continuous bladder irrigation, ie if the urine gets much darker or small clots are seen within the foley tubing.  I suspect that this is going to stop.  If it clears, would plan on removing the catheter tomorrow. Since he has an IVC filter, would recommend that anticoagulation be held for at least 1 week once the catheter is out. I will check back tomorrow.  Please page with questions.

## 2013-06-18 NOTE — Progress Notes (Signed)
Spoke to nurse on the phone this AM.  She reports that his urine is a clear with slight pink tinge.  I requested that the CBI be stopped.  We will monitor his urine this AM and see if it stays clear.  He should get up and move around with his catheter in to ensure that the bleeding does not restart.  If it remains clear it can be removed today.  I will try and stop by to see the patient later this AM.

## 2013-06-18 NOTE — Progress Notes (Signed)
Dr Coralyn Pear provided with an update re: patient requiring increased oxygen during the course of the night to maintain oxygen sats at 90%. Patient now on 80% FiO2 via trach collar. Patient persists to be tachycardic with abdominal breathing. New orders obtained; clarification obtained regarding order for furosemide. Dayshift RN provided with updated information. Respiratory Therapy and IV team notified of new orders.

## 2013-06-18 NOTE — Progress Notes (Signed)
Subjective: Hematuria initially looked to be clearing early this morning, to which Urology stopped his CBI.  As the morning has progressed, hematuria looks to have resumed.  His Hgb dropped to 7.4 overnight and has dropped to 7.3 this morning.  In the setting of continued hematuria, we have transfused him 1 unit of pRBCs. Otherwise, patient has no concerns and denies chest pain, SOB, or abdominal pain.    Objective: Vital signs in last 24 hours: Filed Vitals:   06/17/13 1330 06/17/13 2001 06/18/13 0458 06/18/13 1029  BP: 107/58 114/74 109/57 121/62  Pulse: 67 64 74   Temp: 97.9 F (36.6 C) 98.1 F (36.7 C) 98.4 F (36.9 C)   TempSrc: Oral Oral Oral   Resp: 18 16 17    Height:      Weight:      SpO2: 95% 97% 95%    Weight change:   Intake/Output Summary (Last 24 hours) at 06/18/13 1148 Last data filed at 06/18/13 0807  Gross per 24 hour  Intake  15360 ml  Output  15400 ml  Net    -40 ml    Physical Exam:  General appearance: Alert and oriented x3. Patient is lying comfortably and in good spirits.  HEENT: Pupils are equal, round and reactive. Extra occular movements are intact.  Cardiovascular: Regular rate and rhythm, normal S1S2, no murmurs/rubs/gallops appreciated.  Pulmonary: Normal work of breathing. Chest clear to auscultation bilaterally, no wheezes/rales/rhonchi.  Abdomen: Normoactive bowel sounds. Soft, non-tender/non-distended.  Genitourinary: Catheter in place with no oozing currently. Extremities: Trace to no peripheral edema noted bilaterally. Stable areas of hypopigmentation. Pulses 2+  Skin: Skin color, texture, turgor normal. No rashes or lesions   Lab Results: BMET    Component Value Date/Time   NA 143 06/18/2013 0700   K 3.9 06/18/2013 0700   CL 107 06/18/2013 0700   CO2 28 06/18/2013 0700   GLUCOSE 101* 06/18/2013 0700   BUN 14 06/18/2013 0700   CREATININE 1.11 06/18/2013 0700   CREATININE 0.94 04/24/2013 1648   CALCIUM 10.1 06/18/2013 0700   GFRNONAA 58*  06/18/2013 0700   GFRNONAA 73 04/24/2013 1648   GFRAA 67* 06/18/2013 0700   GFRAA 85 04/24/2013 1648    Micro Results: Recent Results (from the past 240 hour(s))  URINE CULTURE     Status: None   Collection Time    06/17/13  2:38 PM      Result Value Ref Range Status   Specimen Description URINE, CATHETERIZED   Final   Special Requests NONE   Final   Culture  Setup Time     Final   Value: 06/17/2013 19:02     Performed at [place]     Final   Value: NO GROWTH     Performed at [facility]   Culture     Final   Value: NO GROWTH     Performed at [facility]   Report Status 06/18/2013 FINAL   Final    Medications: I have reviewed the patient's current medications. Scheduled Meds: . atorvastatin  20 mg Oral q1800  . benazepril  20 mg Oral q morning - 10a  . cholecalciferol  1,000 Units Oral Daily  . metoprolol tartrate  25 mg Oral BID  . omega-3 acid ethyl esters  1 g Oral Daily  . sodium chloride  3 mL Intravenous Q12H   Continuous Infusions: . sodium chloride irrigation Stopped (06/18/13 1030)   PRN Meds:.acetaminophen  Assessment/Plan:  **Acute LLE DVT - Patient presented with acute left LE swelling and pain s/p a TURP procedure (05/19/13). On presentation pain and swelling had progressed to the hip. Bilateral duplexes were obtained revealing left lower extremity DVT of the saphenofemoral junction, common femoral, profunda, femoral, popliteal, posterior tibial, and peroneal vein. Lovenox and compression stocking initially started (4/1) and then bridged to Coumadin (4/2). His edema and pain resolved but hematuria began during this period of anticoagulation. Due to persistent hematuria, and H&H down-trend, urology recommended discontinuing the anticoagulation and playing IVC filter. IVC filter placed (4/7). - Compression stockings in place  - Per urology recommendations, since IVC filter is in place, anticoagulation can be resumed 1 week  after remittance of hematuria and catheter removal. Thank you for recommendations.   **Hematuria - Patient had no gross hematuria on admission. Gross hematuria began after Lovenox initiation, likely from recent TURPS procedure. Urology consulted and CBI initiated. Due to persistence, urology recommended IVC filter and discontinuing anticoagulation to stop bleeding (anticoagulation now held). Once hematuria looked to be improving, CBI was temporarily discontinued to see results (4/8), if hematuria remits then catheter will be D/Ced. Urology will perform voiding study once complete resolution of bleeding.  - Continue to monitor CBC  - Urology following, thank you for recommendations   ** Acute on Chronic Normocytic Anemia - Patient presented with hemoglobin near baseline of 12. In the setting of hematuria and anticoagulation therapy, hemoglobin dropped to 9.6 (4/1). Hematuria persisted despite CBI and Hgb dropped to 7.5 leading to a 1 unit pRBC transfusion (Hgb up to 8.8). Another 1 unit of pRBC was transfused on 4/8 when Hgb dropped to 7.3. Will continue to monitor.  -Continue to serially follow H&H q8h  ** Frequent falls in setting of Cerebellar Stroke - Patient had a CVA in 2013 which led to frequent falls and the requirement of walker use. He is currently at baseline neurological status. PT/OT recommended SNF placement after discharge.  ** Hypertension - Hypertensive on presentation, currently normotensive. Continued home benazepril 20 mg daily and home metoprolol tartrate 25 mg BID. Continue to monitor.    ** Chronic Hypercalcemia - Patient with chronic elevated calcium levels and PTH on 04/24/13 that was high-normal (47.3), most likely due to primary hyperparathyroidism. Patient also with history of kidney stones. He takes cholecalciferol 1000 U daily at home. Calcium normalized to 9.9 during hospitalization. Albumin found to be 2.0, thus corrected calcium is 11.5. Obtained 25-OH Vitamin D (normal)  and 1,25 OH-Vitamin D levels (low normal).   - PTHrP level obtained to rule out hypercalcemia due to malignancy.  - Consider UPEP, SPEP if other tests negative or work this up as an outpatient   ** Chronic Kidney Disease Stage 2 - CKD stable at presentation. Creatinine near baseline of 1.0 with normal urine output. - Monitor renal function and volume status.   ** Hyperlipidemia - Last lipid panel on 07/20/11 (normal).  -Continued home atorvastatin 20 mg daily   Diet: Heart  Code: Full    Dispo: Social Work is handling possibility of SNF placement at discharge. Patient will see PCP Joni Reining, DO) for follow-up appointment. Patient does have transportation limitations that hinder transportation to clinic appointments     This is a Careers information officer Note.  The care of the patient was discussed with Dr. Eulas Post and the assessment and plan formulated with their assistance.  Please see their attached note for official documentation of the daily encounter.   LOS: 8 days  Isac Sarna, Med Student 06/18/2013, 11:48 AM

## 2013-06-18 NOTE — Progress Notes (Signed)
Subjective: Still with hematuria. Urology has stopped CBI. Hgb dropped again overnight. Pt denies any pain or SOB.    Objective: Vital signs in last 24 hours: Filed Vitals:   06/17/13 2001 06/18/13 0458 06/18/13 1029 06/18/13 1323  BP: 114/74 109/57 121/62 112/63  Pulse: 64 74  68  Temp: 98.1 F (36.7 C) 98.4 F (36.9 C)  98.1 F (36.7 C)  TempSrc: Oral Oral  Oral  Resp: 16 17  18   Height:      Weight:      SpO2: 97% 95%  98%   Weight change:   Intake/Output Summary (Last 24 hours) at 06/18/13 1337 Last data filed at 06/18/13 1322  Gross per 24 hour  Intake  12360 ml  Output  16400 ml  Net  -4040 ml   Vitals reviewed. General: Lying in bed sleeping, NAD HEENT: PERRL, EOMI Cardiac: RRR, no rubs, murmurs or gallops Pulm: Clear to auscultation bilaterally, no wheezes, rales, or rhonchi Abd: Soft, nontender, non-distended GU: Catheter in place with hematuria Ext: Warm and well perfused. LLE with no appreciable edema; no erythema. TED hose in place bilaterally. Decreased pain in the with strong DP pulses in BLE. Moves all 4 extremities. Neuro: Very hard of hearing. Alert and oriented to person, cranial nerves II-XII grossly intact  Lab Results: Basic Metabolic Panel:  Recent Labs Lab 06/14/13 0502 06/18/13 0700  NA 143 143  K 3.8 3.9  CL 105 107  CO2 27 28  GLUCOSE 102* 101*  BUN 17 14  CREATININE 1.06 1.11  CALCIUM 9.9 10.1   CBC:  Recent Labs Lab 06/17/13 2257 06/18/13 0700  WBC 6.2 5.6  HGB 7.4* 7.3*  HCT 21.9* 21.9*  MCV 92.4 92.0  PLT 189 197   Coagulation:  Recent Labs Lab 06/15/13 0320 06/16/13 0700 06/17/13 0535 06/18/13 0700  LABPROT 16.0* 17.0* 14.9 16.1*  INR 1.31 1.42 1.20 1.32   Urinalysis: No results found for this basename: COLORURINE, APPERANCEUR, LABSPEC, PHURINE, GLUCOSEU, HGBUR, BILIRUBINUR, KETONESUR, PROTEINUR, UROBILINOGEN, NITRITE, LEUKOCYTESUR,  in the last 168 hours  Micro Results: Recent Results (from the past  240 hour(s))  URINE CULTURE     Status: None   Collection Time    06/17/13  2:38 PM      Result Value Ref Range Status   Specimen Description URINE, CATHETERIZED   Final   Special Requests NONE   Final   Culture  Setup Time     Final   Value: 06/17/2013 19:02     Performed at [place]     Final   Value: NO GROWTH     Performed at [facility]   Culture     Final   Value: NO GROWTH     Performed at [facility]   Report Status 06/18/2013 FINAL   Final    Studies/Results: No results found.  Medications: I have reviewed the patient's current medications. Scheduled Meds: . atorvastatin  20 mg Oral q1800  . benazepril  20 mg Oral q morning - 10a  . cholecalciferol  1,000 Units Oral Daily  . metoprolol tartrate  25 mg Oral BID  . omega-3 acid ethyl esters  1 g Oral Daily  . sodium chloride  3 mL Intravenous Q12H   Continuous Infusions: . sodium chloride irrigation Stopped (06/18/13 1030)   PRN Meds:.  Assessment/Plan: 80yo M w/ PMH HTN, BPH s/p TURP on 05/19/13, cerebellar stroke '13, CKD Stage II, and HLD  who presented with acute LLE edema and pain and was found to have large clot burden in her LLE venous system and has persistent gross hematuria on anticoagulation.   #Hematuria: Pt with no gross hematuria on admission until Lovenox started for DVT, likely bleeding from the prostate given his recent TURPs. Urology consutled and placed catheter and started CBI. Holding Plavix, Lovenox, and Coumadin. IVC filter placed 4/6 by IR. CBI stopped this morning; still with hematuria. Hgb 7.3 this morning, tx 1 more unit PRBC for a total fo 2 this admission. VSS. - CBC q8 - F/u w/ Urology, appreciate recommendations.   #Acute on chronic anemia, normocytic: Baseline Hgb around 12. H/o TURPs 3/9 with hematuria that has resolved per the daughter. No gross hematuria on admission. Hgb 11.6 this admission, 12.2 prior to procedure. Lovenox started on  admission and Hgb dropped to 9.6 --> 7.3 today. Transufusing 1u pRBCs again.  Recent Labs Lab 06/16/13 2350 06/17/13 0535 06/17/13 1455 06/17/13 2257 06/18/13 0700  HGB 7.8* 7.9* 8.2* 7.4* 7.3*   - q8h CBC  #Acute LLE DVT: Pt s/p TURPs on 05/19/13 for BPH. He was brought to the ED on 3/31 with significant edema and pain. BLE dopplers were ordered and reveled a large clot burden throughout the left leg. Pt started on Lovenox and Coumadin which have been held 2/2 hematuria. IVC filter by IR 4/6. LLE edema and pain are resolved. Per Urology, likely restart anticoagulation in 1 week. - holding anticoagulation, IVC filter in place - Compression stockings   #Hypercalcemia: Ca this admission elevated to 11.4 with previously elevated levels. PTH normal in Feb,and Ca normalized to 9.9. Checked Vit D levels, and PTH-related protein ordered to help determine if his hypercalcemia could be related to malignancy. - Vit D levels normal, PTH-related peptide pending. If this is normal and Ca remains normal, will check  SPEP and UPEP to r/o possible malignancy. - PTH- related peptide pending - Checking SPEP and UPEP  #HTN: Stable. BP well controlled on home lopressor and benazepril  - Lopressor 25mg  BID - Benazepril 20mg  daily  #Frequent falls in setting of Cerebellar Stroke: H/o CVA in 2013 which has led to frequent falls and the requirement of a walker. He is currently at baseline neurological status. PT/OT consulted in the hospital and rec SNF placement. - Continue PT - SNF at d/c  #Chronic Kidney Disease, Stage 2: Stable. Creatinine near baseline of 1.0.   Recent Labs Lab 06/12/13 0530 06/14/13 0502 06/18/13 0700  CREATININE 1.10 1.06 1.11    #Hyperlipidemia: Stable. Last lipid panel normal on 07/20/11.  - Continuing home atorvastatin 20 mg daily    Dispo: Disposition is deferred at this time, awaiting improvement of current medical problems.  Will d/c to SNF once ready, hopefully in a few  days.   The patient does have a current PCP Joni Reining, DO) and does need an [facility] hospital follow-up appointment after discharge.  The patient does not have transportation limitations that hinder transportation to clinic appointments.  .Services Needed at time of discharge: Y = Yes, Blank = No PT:   OT:   RN:   Equipment:   Other:      LOS: 8 days   Otho Bellows, MD 06/18/2013, 1:37 PM

## 2013-06-18 NOTE — Progress Notes (Signed)
Urologist MD called this morning and notified RN to turn off continuous bladder irrigation for now. MD will come assess pt once. Continuous bladder irrigation is now off, foley still intact.

## 2013-06-19 DIAGNOSIS — R319 Hematuria, unspecified: Secondary | ICD-10-CM

## 2013-06-19 LAB — TYPE AND SCREEN
ABO/RH(D): A POS
Antibody Screen: NEGATIVE
Unit division: 0
Unit division: 0

## 2013-06-19 LAB — CBC
HCT: 26.3 % — ABNORMAL LOW (ref 39.0–52.0)
Hemoglobin: 8.9 g/dL — ABNORMAL LOW (ref 13.0–17.0)
MCH: 31.1 pg (ref 26.0–34.0)
MCHC: 33.8 g/dL (ref 30.0–36.0)
MCV: 92 fL (ref 78.0–100.0)
Platelets: 177 10*3/uL (ref 150–400)
RBC: 2.86 MIL/uL — ABNORMAL LOW (ref 4.22–5.81)
RDW: 14.9 % (ref 11.5–15.5)
WBC: 5.7 10*3/uL (ref 4.0–10.5)

## 2013-06-19 LAB — PROTIME-INR
INR: 1.13 (ref 0.00–1.49)
Prothrombin Time: 14.3 seconds (ref 11.6–15.2)

## 2013-06-19 NOTE — Progress Notes (Addendum)
Subjective: Still with hematuria. Hgb improved after transfusion and stable over night. Pt denies any pain or SOB.    Objective: Vital signs in last 24 hours: Filed Vitals:   06/18/13 1531 06/18/13 1635 06/18/13 2107 06/19/13 0632  BP: 119/72 123/72 114/60 114/61  Pulse: 70 71 78 69  Temp: 97.9 F (36.6 C) 98.7 F (37.1 C) 98.3 F (36.8 C) 98 F (36.7 C)  TempSrc: Oral Oral Oral Oral  Resp: 17  18 18   Height:      Weight:      SpO2: 97% 97% 97% 94%   Weight change:   Intake/Output Summary (Last 24 hours) at 06/19/13 0819 Last data filed at 06/19/13 0600  Gross per 24 hour  Intake    486 ml  Output   1900 ml  Net  -1414 ml   Vitals reviewed. General: Lying in bed sleeping, NAD HEENT: PERRL, EOMI Cardiac: RRR, no rubs, murmurs or gallops Pulm: Clear to auscultation bilaterally, no wheezes, rales, or rhonchi Abd: Soft, nontender, non-distended GU: Catheter in place with hematuria but more brown looking and not bright red, appears somewhat improved. Ext: Warm and well perfused. LLE with no appreciable edema; no erythema. TED hose in place bilaterally. Decreased pain in the with strong DP pulses in BLE. Moves all 4 extremities. Neuro: Very hard of hearing. Alert and oriented to person, cranial nerves II-XII grossly intact  Lab Results: Basic Metabolic Panel:  Recent Labs Lab 06/14/13 0502 06/18/13 0700  NA 143 143  K 3.8 3.9  CL 105 107  CO2 27 28  GLUCOSE 102* 101*  BUN 17 14  CREATININE 1.06 1.11  CALCIUM 9.9 10.1   CBC:  Recent Labs Lab 06/18/13 1957 06/18/13 2343  WBC 5.9 6.1  HGB 8.2* 8.2*  HCT 24.2* 24.4*  MCV 92.4 91.7  PLT 174 181   Coagulation:  Recent Labs Lab 06/15/13 0320 06/16/13 0700 06/17/13 0535 06/18/13 0700  LABPROT 16.0* 17.0* 14.9 16.1*  INR 1.31 1.42 1.20 1.32   Urinalysis: No results found for this basename: COLORURINE, APPERANCEUR, LABSPEC, PHURINE, GLUCOSEU, HGBUR, BILIRUBINUR, KETONESUR, PROTEINUR, UROBILINOGEN,  NITRITE, LEUKOCYTESUR,  in the last 168 hours  Micro Results: Recent Results (from the past 240 hour(s))  URINE CULTURE     Status: None   Collection Time    06/17/13  2:38 PM      Result Value Ref Range Status   Specimen Description URINE, CATHETERIZED   Final   Special Requests NONE   Final   Culture  Setup Time     Final   Value: 06/17/2013 19:02     Performed at [place]     Final   Value: NO GROWTH     Performed at [facility]   Culture     Final   Value: NO GROWTH     Performed at [facility]   Report Status 06/18/2013 FINAL   Final    Studies/Results: No results found.  Medications: I have reviewed the patient's current medications. Scheduled Meds: . atorvastatin  20 mg Oral q1800  . benazepril  20 mg Oral q morning - 10a  . cholecalciferol  1,000 Units Oral Daily  . metoprolol tartrate  25 mg Oral BID  . omega-3 acid ethyl esters  1 g Oral Daily  . sodium chloride  3 mL Intravenous Q12H   Continuous Infusions: . sodium chloride 100 mL/hr at 06/18/13 2057  . sodium chloride irrigation Stopped (  06/18/13 1030)   PRN Meds:.  Assessment/Plan: 80yo M w/ PMH HTN, BPH s/p TURP on 05/19/13, cerebellar stroke '13, CKD Stage II, and HLD who presented with acute LLE edema and pain and was found to have large clot burden in her LLE venous system and has persistent gross hematuria on anticoagulation.   #Hematuria: Pt with no gross hematuria on admission until Lovenox started for DVT, likely bleeding from the prostate given his recent TURPs. Urology consutled and placed catheter and started CBI. Holding Plavix, Lovenox, and Coumadin. IVC filter placed 4/6 by IR. CBI stopped 4/8; still with hematuria this morning but improving. Urology to remove catheter today.  - CBC pending this morning  - F/u w/ Urology, appreciate recommendations.   #Acute on chronic anemia, normocytic: Baseline Hgb around 12. H/o TURPs 3/9 with hematuria that has  resolved per the daughter. No gross hematuria on admission with Hgb 11.6, 12.2 prior to procedure. Lovenox/Coumadin started on admission and Hgb dropped below 7.5, requiring transfusion of a total of 2u pRBCs this admission. Improved to 8.2, morning CBC pending. VSS.  Recent Labs Lab 06/17/13 1455 06/17/13 2257 06/18/13 0700 06/18/13 1957 06/18/13 2343  HGB 8.2* 7.4* 7.3* 8.2* 8.2*   - am CBC pending  #Acute LLE DVT: Pt s/p TURPs on 05/19/13 for BPH. He was brought to the ED on 3/31 with significant edema and pain. BLE dopplers were ordered and reveled a large clot burden throughout the left leg. Pt started on Lovenox and Coumadin which have been held 2/2 hematuria. IVC filter by IR 4/6. LLE edema and pain are resolved. Per Urology, likely restart anticoagulation in 1 week. - Holding anticoagulation, IVC filter in place - Compression stockings   #Hypercalcemia: Ca this admission elevated to 11.4 with previously elevated levels. PTH normal in Feb,and Ca normalized to 9.9. Checked Vit D levels, and PTH-related protein ordered to help determine if his hypercalcemia could be related to malignancy. - Vit D levels and PTH-related peptide normal. Checking SPEP and UPEP to r/o possible malignancy. - SPEP and UPEP pending  #HTN: Stable. BP well controlled on home lopressor and benazepril  - Lopressor 25mg  BID - Benazepril 20mg  daily  #Frequent falls in setting of Cerebellar Stroke: H/o CVA in 2013 which has led to frequent falls and the requirement of a walker. He is currently at baseline neurological status. PT/OT consulted in the hospital and rec SNF placement. - Continue PT - SNF at d/c  #Chronic Kidney Disease, Stage 2: Stable. Creatinine near baseline of 1.0.   Recent Labs Lab 06/14/13 0502 06/18/13 0700  CREATININE 1.06 1.11    #Hyperlipidemia: Stable. Last lipid panel normal on 07/20/11.  - Continuing home atorvastatin 20 mg daily    Dispo: Disposition is deferred at this time,  awaiting improvement of current medical problems.  Will d/c to SNF once ready, hopefully on Friday.   The patient does have a current PCP Joni Reining, DO) and does need an [facility] hospital follow-up appointment after discharge.  The patient does not have transportation limitations that hinder transportation to clinic appointments.  .Services Needed at time of discharge: Y = Yes, Blank = No PT:   OT:   RN:   Equipment:   Other:      LOS: 9 days   Otho Bellows, MD 06/19/2013, 8:19 AM

## 2013-06-19 NOTE — Progress Notes (Signed)
Internal Medicine Attending  Date: 06/19/2013  Patient name: [name] Medical record number: [id number] Date of birth: [birth year] Age: 80 y.o. Gender: male  I saw and evaluated the patient. I reviewed the resident's note by Dr. Eulas Post and I agree with the resident's findings and plans as documented in her note.

## 2013-06-19 NOTE — Progress Notes (Signed)
   I have seen the patient and reviewed the daily progress note by Bess Harvest, MS 4 and discussed the care of the patient with him.  See my separate note from 06/18/13 for documentation of my findings, assessment, and plans.   Otho Bellows, MD 06/19/2013, 8:18 AM

## 2013-06-19 NOTE — Progress Notes (Signed)
   I have seen the patient and reviewed the daily progress note by Bess Harvest, MS 4 and discussed the care of the patient with him.  See my separate note for documentation of my findings, assessment, and plans.    LOS: 9 days   Otho Bellows, MD 06/19/2013, 11:55 AM

## 2013-06-19 NOTE — Progress Notes (Signed)
Discontinued foley. Patient experienced some mild discomfort. Will continue to monitor patient for further changes in condition.

## 2013-06-19 NOTE — Progress Notes (Signed)
Subjective: Urology saw him this morning and noted that the urine in his foley tubing was brown and consistent with old blood.  He's been off CBI since yesterday and they will remove the foley catheter today and then collect urine over the course of the day to ensure that bleeding remains stopped. They also recommend keeping an eye on the post-void residual to make sure urinary retention doesn't develop.   Otherwise, he is doing well today with no complaints. He denies chest pain, shortness of breath, abdominal pain, or leg swelling/pain.   Objective: Vital signs in last 24 hours: Filed Vitals:   06/18/13 1531 06/18/13 1635 06/18/13 2107 06/19/13 0632  BP: 119/72 123/72 114/60 114/61  Pulse: 70 71 78 69  Temp: 97.9 F (36.6 C) 98.7 F (37.1 C) 98.3 F (36.8 C) 98 F (36.7 C)  TempSrc: Oral Oral Oral Oral  Resp: 17  18 18   Height:      Weight:      SpO2: 97% 97% 97% 94%   Weight change:   Intake/Output Summary (Last 24 hours) at 06/19/13 0742 Last data filed at 06/19/13 0600  Gross per 24 hour  Intake    486 ml  Output   3300 ml  Net  -2814 ml    Physical Exam:  General appearance: Alert and oriented x3. Patient is lying comfortably and in good spirits.  HEENT: Pupils are equal, round and reactive. Extra occular movements are intact.  Cardiovascular: Regular rate and rhythm, normal S1S2, no murmurs/rubs/gallops appreciated.  Pulmonary: Normal work of breathing. Chest clear to auscultation bilaterally, no wheezes/rales/rhonchi.  Abdomen: Normoactive bowel sounds. Soft, non-tender/non-distended.  Genitourinary: Catheter in place with no oozing currently. Dark urine in tube.  Extremities: Trace to no peripheral edema noted bilaterally. Stable areas of hypopigmentation. Pulses 2+  Skin: Skin color, texture, turgor normal. No rashes or lesions  Lab Results: CBC    Component Value Date/Time   WBC 6.1 06/18/2013 2343   RBC 2.66* 06/18/2013 2343   HGB 8.2* 06/18/2013 2343   HCT  24.4* 06/18/2013 2343   PLT 181 06/18/2013 2343   MCV 91.7 06/18/2013 2343   MCH 30.8 06/18/2013 2343   MCHC 33.6 06/18/2013 2343   RDW 14.7 06/18/2013 2343   LYMPHSABS 1.4 06/10/2013 1437   MONOABS 0.5 06/10/2013 1437   EOSABS 0.0 06/10/2013 1437   BASOSABS 0.0 06/10/2013 1437    Micro Results: Recent Results (from the past 240 hour(s))  URINE CULTURE     Status: None   Collection Time    06/17/13  2:38 PM      Result Value Ref Range Status   Specimen Description URINE, CATHETERIZED   Final   Special Requests NONE   Final   Culture  Setup Time     Final   Value: 06/17/2013 19:02     Performed at [place]     Final   Value: NO GROWTH     Performed at [facility]   Culture     Final   Value: NO GROWTH     Performed at [facility]   Report Status 06/18/2013 FINAL   Final    Medications: I have reviewed the patient's current medications. Scheduled Meds: . atorvastatin  20 mg Oral q1800  . benazepril  20 mg Oral q morning - 10a  . cholecalciferol  1,000 Units Oral Daily  . metoprolol tartrate  25 mg Oral BID  . omega-3 acid ethyl  esters  1 g Oral Daily  . sodium chloride  3 mL Intravenous Q12H   Continuous Infusions: . sodium chloride 100 mL/hr at 06/18/13 2057  . sodium chloride irrigation Stopped (06/18/13 1030)   PRN Meds:.acetaminophen  Assessment/Plan:  **Hematuria - Patient had no gross hematuria on admission. Gross hematuria began after Lovenox initiation, likely from recent TURPS procedure. Urology consulted and CBI initiated. Due to persistence, urology recommended IVC filter and discontinuing anticoagulation to stop bleeding (anticoagulation now held). On 4/8 hematuria looked to be improving, CBI was temporarily discontinued. Following morning, urine in foley tubing was brown and consistent with old blood leading to urology removing foley catheter. They will collect urine over the course of today to ensure that the bleeding remains  stopped. The post-void residual will be followed to ensure urinary retention doesn't develop. Urology will perform voiding study once complete resolution of bleeding.  - Continue to monitor CBC  - Urology following, thank you for recommendations    ** Acute on Chronic Normocytic Anemia - Patient presented with hemoglobin near baseline of 12. In the setting of hematuria and anticoagulation therapy, hemoglobin dropped to 9.6 (4/1). Hematuria persisted despite CBI and Hgb dropped to 7.5 leading to a 1 unit pRBC transfusion (Hgb up to 8.8). Another 1 unit of pRBC was transfused on 4/8 when Hgb dropped to 7.3 (Hgb up to 8.2 following transfusion). Total of 2 units transfused during admission. Will continue to monitor.  -Continue to serially follow H&H q8h   **Acute LLE DVT - Patient presented with acute left LE swelling and pain s/p a TURP procedure (05/19/13). On presentation pain and swelling had progressed to the hip. Bilateral duplexes were obtained revealing left lower extremity DVT of the saphenofemoral junction, common femoral, profunda, femoral, popliteal, posterior tibial, and peroneal vein. Lovenox and compression stocking initially started (4/1) and then bridged to Coumadin (4/2). His edema and pain resolved but hematuria began during this period of anticoagulation. Due to persistent hematuria, and H&H down-trend, urology recommended discontinuing the anticoagulation and playing IVC filter. IVC filter placed (4/7). Per urology recommendations, since IVC filter is in place, anticoagulation can be resumed 1 week after remittance of hematuria and catheter removal. Thank you for recommendations.  - Compression stockings in place   ** Frequent falls in setting of Cerebellar Stroke - Patient had a CVA in 2013 which led to frequent falls and the requirement of walker use. He is currently at baseline neurological status. PT/OT recommended SNF placement after discharge.   ** Hypertension - Hypertensive on  presentation, currently normotensive. Continued home benazepril 20 mg daily and home metoprolol tartrate 25 mg BID. Continue to monitor.   ** Chronic Hypercalcemia - Patient with chronic elevated calcium levels and PTH on 04/24/13 that was high-normal (47.3), most likely due to primary hyperparathyroidism. Patient also with history of kidney stones. He takes cholecalciferol 1000 U daily at home. Calcium normalized to 9.9 during hospitalization. Albumin found to be 2.0, thus corrected calcium is 11.5. Obtained 25-OH Vitamin D (normal) and 1,25 OH-Vitamin D levels (low normal). PTHrP level obtained to rule out hypercalcemia of malignancy which was normal (4/8).  - Obtaining IFE (Urine) and SPEP for further work-up   ** Chronic Kidney Disease Stage 2 - CKD stable at presentation. Creatinine near baseline of 1.0 with normal urine output.  - Monitor renal function and volume status.   ** Hyperlipidemia - Last lipid panel on 07/20/11 (normal).  -Continued home atorvastatin 20 mg daily   Diet: Heart  Code:  Full   Dispo: Social Work is handling possibility of SNF placement at discharge. Patient will see PCP Joni Reining, DO) for follow-up appointment. Patient does have transportation limitations that hinder transportation to clinic appointments     This is a Careers information officer Note.  The care of the patient was discussed with Dr. Eulas Post and the assessment and plan formulated with their assistance.  Please see their attached note for official documentation of the daily encounter.   LOS: 9 days   Isac Sarna, Med Student 06/19/2013, 7:42 AM

## 2013-06-19 NOTE — Progress Notes (Signed)
Report given to receiving RN. Patient in bed resting watching TV. No verbal complaints and no signs or symptoms of distress or discomfort. 

## 2013-06-19 NOTE — Progress Notes (Signed)
Physical Therapy Treatment Patient Details Name: [name] MRN: [id number] DOB: 1924/07/29 Today's Date: 07-17-13    History of Present Illness Pt with IVC filter 06/16/2013    PT Comments    Pt able to increase gait distance with standing rest breaks today.  Continues to require mod A for balance and gait due to posterior lean.  Continue to recommend SNF for continued rehab.  Follow Up Recommendations  SNF;Supervision/Assistance - 24 hour     Equipment Recommendations  None recommended by PT    Recommendations for Other Services       Precautions / Restrictions Precautions Precautions: Fall Restrictions Weight Bearing Restrictions: No    Mobility  Bed Mobility Overal bed mobility: Needs Assistance Bed Mobility: Supine to Sit         Sit to sidelying: Mod assist General bed mobility comments: using railing to help, requires assist for trunk and to scoot hips forward on bed  Transfers Overall transfer level: Needs assistance Equipment used: Rolling walker (2 wheeled)   Sit to Stand: Mod assist         General transfer comment: assist for anterior wt shift, pt with posterior lean, mod A to find balance  Ambulation/Gait Ambulation/Gait assistance: Mod assist Ambulation Distance (Feet): 25 Feet Assistive device: Rolling walker (2 wheeled)     Gait velocity interpretation: <1.8 ft/sec, indicative of risk for recurrent falls General Gait Details: pt with posterior lean requiring mod A throughout to correct, max A with turns with RW, requires several standing rest breaks due to fatigue   Stairs            Wheelchair Mobility    Modified Rankin (Stroke Patients Only)       Balance                                    Cognition Arousal/Alertness: Awake/alert Behavior During Therapy: WFL for tasks assessed/performed Overall Cognitive Status: Difficult to assess                      Exercises General Exercises - Lower  Extremity Ankle Circles/Pumps: AROM;Both;Seated;10 reps Long Arc Quad: AROM;10 reps;Both Hip Flexion/Marching: AROM;Both;10 reps    General Comments        Pertinent Vitals/Pain No c/o pain    Home Living                      Prior Function            PT Goals (current goals can now be found in the care plan section) Progress towards PT goals: Progressing toward goals    Frequency  Min 2X/week    PT Plan Current plan remains appropriate    Co-evaluation             End of Session Equipment Utilized During Treatment: Gait belt Activity Tolerance: Patient tolerated treatment well Patient left: in chair;with chair alarm set;with call bell/phone within reach     Time: 1000-1023 PT Time Calculation (min): 23 min  Charges:  $Gait Training: 8-22 mins $Therapeutic Exercise: 8-22 mins                    G Codes:      [name] Jul 17, 2013, 11:28 AM

## 2013-06-19 NOTE — Progress Notes (Signed)
Patient voided 200 cc since removal of catheter. Will continue to monitor patient for further changes in condition.

## 2013-06-19 NOTE — Progress Notes (Signed)
Off CBI since yesterday.  Urine in foley tubing brown, consistent with old blood.  Reasonable to remove foley catheter today and then rack urine over the course of the day to ensure that bleeding remains stopped.  Should keep close eye on PVRs as well to ensure that he doesn't develop retention.

## 2013-06-20 LAB — UIFE/LIGHT CHAINS/TP QN, 24-HR UR
Albumin, U: DETECTED
Alpha 1, Urine: DETECTED — AB
Alpha 2, Urine: DETECTED — AB
Beta, Urine: DETECTED — AB
Free Kappa Lt Chains,Ur: 6.05 mg/dL — ABNORMAL HIGH (ref 0.14–2.42)
Free Kappa/Lambda Ratio: 13.15 ratio — ABNORMAL HIGH (ref 2.04–10.37)
Free Lambda Lt Chains,Ur: 0.46 mg/dL (ref 0.02–0.67)
Gamma Globulin, Urine: DETECTED — AB
Total Protein, Urine: 11.9 mg/dL

## 2013-06-20 LAB — PROTEIN ELECTROPHORESIS, SERUM
Albumin ELP: 43 % — ABNORMAL LOW (ref 55.8–66.1)
Alpha-1-Globulin: 9.4 % — ABNORMAL HIGH (ref 2.9–4.9)
Alpha-2-Globulin: 11.2 % (ref 7.1–11.8)
Beta 2: 6.6 % — ABNORMAL HIGH (ref 3.2–6.5)
Beta Globulin: 7.5 % — ABNORMAL HIGH (ref 4.7–7.2)
Gamma Globulin: 22.3 % — ABNORMAL HIGH (ref 11.1–18.8)
M-Spike, %: NOT DETECTED g/dL
Total Protein ELP: 5.1 g/dL — ABNORMAL LOW (ref 6.0–8.3)

## 2013-06-20 LAB — CBC
HCT: 24.6 % — ABNORMAL LOW (ref 39.0–52.0)
Hemoglobin: 8.2 g/dL — ABNORMAL LOW (ref 13.0–17.0)
MCH: 30.7 pg (ref 26.0–34.0)
MCHC: 33.3 g/dL (ref 30.0–36.0)
MCV: 92.1 fL (ref 78.0–100.0)
Platelets: 181 10*3/uL (ref 150–400)
RBC: 2.67 MIL/uL — ABNORMAL LOW (ref 4.22–5.81)
RDW: 14.7 % (ref 11.5–15.5)
WBC: 5.7 10*3/uL (ref 4.0–10.5)

## 2013-06-20 LAB — PROTIME-INR
INR: 1.17 (ref 0.00–1.49)
Prothrombin Time: 14.7 seconds (ref 11.6–15.2)

## 2013-06-20 NOTE — Progress Notes (Signed)
   I have seen the patient and reviewed the daily progress note by Bess Harvest, MS-4 and discussed the care of the patient with him.  See my separate note for documentation of my findings, assessment, and plans.    LOS: 10 days   Otho Bellows, MD 06/20/2013, 12:54 PM

## 2013-06-20 NOTE — Progress Notes (Signed)
Internal Medicine Attending  Date: 06/20/2013  Patient name: [name] Medical record number: [id number] Date of birth: 1925-01-15 Age: 80 y.o. Gender: male  I saw and evaluated the patient, and discussed her care on A.M rounds with housestaff.  I reviewed the resident's note by Dr. Eulas Post and I agree with the resident's findings and plans as documented in her note.

## 2013-06-20 NOTE — Progress Notes (Addendum)
Subjective: Catheter removed and pt voiding with condom cath. Hematuria has resolved. Pt denies any pain or SOB.    Objective: Vital signs in last 24 hours: Filed Vitals:   06/19/13 1500 06/19/13 2100 06/19/13 2106 06/20/13 0242  BP: 127/70 118/76 126/69 110/79  Pulse: 66 66 76 71  Temp: 97.9 F (36.6 C)  98.4 F (36.9 C) 98.1 F (36.7 C)  TempSrc: Oral  Oral Oral  Resp: 16  18 18   Height:      Weight:      SpO2: 98%  97% 98%   Weight change:   Intake/Output Summary (Last 24 hours) at 06/20/13 1218 Last data filed at 06/20/13 1000  Gross per 24 hour  Intake   1240 ml  Output   2100 ml  Net   -860 ml   Vitals reviewed. General: Lying in bed, NAD HEENT: PERRL, EOMI Cardiac: RRR, no rubs, murmurs or gallops Pulm: Clear to auscultation bilaterally, no wheezes, rales, or rhonchi Abd: Soft, nontender, non-distended GU: Condom catheter in place with clear yellow urine Ext: Warm and well perfused. LLE with no appreciable edema; no erythema. TED hose in place bilaterally. Decreased pain in the with strong DP pulses in BLE. Moves all 4 extremities. Neuro: Very hard of hearing. Alert and oriented to person, cranial nerves II-XII grossly intact  Lab Results: Basic Metabolic Panel:  Recent Labs Lab 06/14/13 0502 06/18/13 0700  NA 143 143  K 3.8 3.9  CL 105 107  CO2 27 28  GLUCOSE 102* 101*  BUN 17 14  CREATININE 1.06 1.11  CALCIUM 9.9 10.1   CBC:  Recent Labs Lab 06/19/13 1114 06/20/13 0423  WBC 5.7 5.7  HGB 8.9* 8.2*  HCT 26.3* 24.6*  MCV 92.0 92.1  PLT 177 181   Coagulation:  Recent Labs Lab 06/17/13 0535 06/18/13 0700 06/19/13 0915 06/20/13 0423  LABPROT 14.9 16.1* 14.3 14.7  INR 1.20 1.32 1.13 1.17   Urinalysis: No results found for this basename: COLORURINE, APPERANCEUR, LABSPEC, PHURINE, GLUCOSEU, HGBUR, BILIRUBINUR, KETONESUR, PROTEINUR, UROBILINOGEN, NITRITE, LEUKOCYTESUR,  in the last 168 hours  Micro Results: Recent Results (from the  past 240 hour(s))  URINE CULTURE     Status: None   Collection Time    06/17/13  2:38 PM      Result Value Ref Range Status   Specimen Description URINE, CATHETERIZED   Final   Special Requests NONE   Final   Culture  Setup Time     Final   Value: 06/17/2013 19:02     Performed at [place]     Final   Value: NO GROWTH     Performed at [facility]   Culture     Final   Value: NO GROWTH     Performed at [facility]   Report Status 06/18/2013 FINAL   Final    Studies/Results: No results found.  Medications: I have reviewed the patient's current medications. Scheduled Meds: . atorvastatin  20 mg Oral q1800  . benazepril  20 mg Oral q morning - 10a  . cholecalciferol  1,000 Units Oral Daily  . metoprolol tartrate  25 mg Oral BID  . omega-3 acid ethyl esters  1 g Oral Daily  . sodium chloride  3 mL Intravenous Q12H   Continuous Infusions: . sodium chloride 100 mL/hr at 06/19/13 1908   PRN Meds:.  Assessment/Plan: 80yo M w/ PMH HTN, BPH s/p TURP on 05/19/13, cerebellar stroke '  13, CKD Stage II, and HLD who presented with acute LLE edema and pain and was found to have large clot burden in her LLE venous system and has persistent gross hematuria on anticoagulation.   #Gross hematuria: Resolved. Pt with no gross hematuria at the time of admission until Lovenox started for DVT, likely bleeding from the prostate given his recent TURPs. Urology consutled and placed catheter and started CBI. Holding Plavix, Lovenox, and Coumadin. IVC filter placed 4/6 by IR. CBI stopped 4/8 and catheter removed 4/9. No further hematuria, Hgb stable. He will need Urology f/u after discharge which will be scheduled by Urology.  - F/u w/ Urology, appreciate recommendations.   #Acute on chronic anemia, normocytic: Baseline Hgb around 12. H/o TURPs 3/9 with hematuria that has resolved per the daughter. No gross hematuria on admission with Hgb 11.6, 12.2 prior to  procedure. Lovenox/Coumadin started on admission and Hgb dropped below 7.5, requiring transfusion of a total of 2u pRBCs this admission. Improved to 8.2 and remains stable. VSS.  Recent Labs Lab 06/18/13 0700 06/18/13 1957 06/18/13 2343 06/19/13 1114 06/20/13 0423  HGB 7.3* 8.2* 8.2* 8.9* 8.2*    #Acute LLE DVT: Pt s/p TURPs on 05/19/13 for BPH. He was brought to the ED on 3/31 with significant edema and pain. BLE dopplers were ordered and reveled a large clot burden throughout the left leg. Pt started on Lovenox and Coumadin which have been held 2/2 hematuria. IVC filter by IR 4/6. LLE edema and pain are resolved. Per Urology, likely restart anticoagulation in 1 week now that the hematuria has resolved. Once the anticoagulation is restarted and he has been without reoccurrence of the hematuria for a few weeks, his IVC filter may be able to be removed.  - Continuing to hold anticoagulation, IVC filter in place - Compression stockings   #Hypercalcemia: Ca this admission elevated to 11.4 with previously elevated levels. PTH normal in Feb,and Ca normalized to 9.9. Checked Vit D levels, and PTH-related protein ordered to help determine if his hypercalcemia could be related to malignancy. - Vit D levels and PTH-related peptide normal. Checking SPEP and UPEP to r/o possible malignancy. UPEP shows no monoclonal free light chains but did have elevated kappa light chains and kappa/lambda ratio. Normal urine protein level. 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  Consider checking serum IPE for further evaluation as an outpatient.  #HTN: Stable. BP well controlled on home lopressor and benazepril  - Lopressor 25mg  BID - Benazepril 20mg  daily  #Frequent falls in setting of Cerebellar Stroke: H/o CVA in 2013 which has led to frequent falls and the  requirement of a walker. He is currently at baseline neurological status. PT/OT consulted in the hospital and rec SNF placement. - Continue PT - SNF at d/c and continue PT there  #Chronic Kidney Disease, Stage 2: Stable. Creatinine near baseline of 1.0.   Recent Labs Lab 06/14/13 0502 06/18/13 0700  CREATININE 1.06 1.11    #Hyperlipidemia: Stable. Last lipid panel normal on 07/20/11.  - Continuing home atorvastatin 20 mg daily    Dispo: D/c to SNF today.   The patient does have a current PCP Joni Reining, DO) and does need an [facility] hospital follow-up appointment after discharge.  The patient does not have transportation limitations that hinder transportation to clinic appointments.  .Services Needed at time of discharge: Y = Yes,  Blank = No PT:   OT:   RN:   Equipment:   Other:      LOS: 10 days   Otho Bellows, MD 06/20/2013, 12:18 PM

## 2013-06-20 NOTE — Progress Notes (Signed)
Clinical Social Work Department CLINICAL SOCIAL WORK PLACEMENT NOTE 06/20/2013  Patient:  [name], [name]  Account Number:  [id number] Admit date:  06/10/2013  Clinical Social Worker:  Megan Salon  Date/time:  06/12/2013 02:29 PM  Clinical Social Work is seeking post-discharge placement for this patient at the following level of care:   [place]   (*CSW will update this form in Epic as items are completed)   06/12/2013  Patient/family provided with [place] Department of Clinical Social Work's list of facilities offering this level of care within the geographic area requested by the patient (or if unable, by the patient's family).  06/12/2013  Patient/family informed of their freedom to choose among providers that offer the needed level of care, that participate in Medicare, Medicaid or managed care program needed by the patient, have an available bed and are willing to accept the patient.  06/12/2013  Patient/family informed of [facility]' ownership interest in [facility], as well as of the fact that they are under no obligation to receive care at this facility.  PASARR submitted to EDS on 06/12/2013 PASARR number received from EDS on 06/12/2013  FL2 transmitted to all facilities in geographic area requested by pt/family on  06/12/2013 FL2 transmitted to all facilities within larger geographic area on   Patient informed that his/her managed care company has contracts with or will negotiate with  certain facilities, including the following:     Patient/family informed of bed offers received:  06/16/2013 Patient chooses bed at [facility] Physician recommends and patient chooses bed at    Patient to be transferred to [place] on  06/20/2013 Patient to be transferred to facility by EMS  The following physician request were entered in Epic:   Additional Comments:  Jeanette Caprice, MSW,  [place]

## 2013-06-20 NOTE — Progress Notes (Signed)
Subjective: Patient is doing well today. His hematuria has resolved and urine output was adequate over past 24 hours (1.5L). Urology saw him this morning, is ready to sign off, recommended bladder scan (to ensure no retention/overflow), and would set up close outpatient follow-up with Dr. Gaynelle Arabian in the couple of weeks. Otherwise patient has no complaints and denies chest pain, SOB, abdominal pain, or leg swelling/pain.  Objective: Vital signs in last 24 hours: Filed Vitals:   06/19/13 1500 06/19/13 2100 06/19/13 2106 06/20/13 0242  BP: 127/70 118/76 126/69 110/79  Pulse: 66 66 76 71  Temp: 97.9 F (36.6 C)  98.4 F (36.9 C) 98.1 F (36.7 C)  TempSrc: Oral  Oral Oral  Resp: 16  18 18   Height:      Weight:      SpO2: 98%  97% 98%   Weight change:   Intake/Output Summary (Last 24 hours) at 06/20/13 1205 Last data filed at 06/20/13 1000  Gross per 24 hour  Intake   1240 ml  Output   2100 ml  Net   -860 ml   Physical Exam:  General appearance: Alert and oriented x3. Patient is lying comfortably and in good spirits.  HEENT: Pupils are equal, round and reactive. Extra occular movements are intact.  Cardiovascular: Regular rate and rhythm, normal S1S2, no murmurs/rubs/gallops appreciated.  Pulmonary: Normal work of breathing. Chest clear to auscultation bilaterally, no wheezes/rales/rhonchi.  Abdomen: Normoactive bowel sounds. Soft, non-tender/non-distended.  Genitourinary: Catheter in place with no oozing currently. Dark urine in tube.  Extremities: Trace to no peripheral edema noted bilaterally. Stable areas of hypopigmentation. Pulses 2+  Skin: Skin color, texture, turgor normal. No rashes or lesions  Lab Results: CBC    Component Value Date/Time   WBC 5.7 06/20/2013 0423   RBC 2.67* 06/20/2013 0423   HGB 8.2* 06/20/2013 0423   HCT 24.6* 06/20/2013 0423   PLT 181 06/20/2013 0423   MCV 92.1 06/20/2013 0423   MCH 30.7 06/20/2013 0423   MCHC 33.3 06/20/2013 0423   RDW 14.7  06/20/2013 0423   LYMPHSABS 1.4 06/10/2013 1437   MONOABS 0.5 06/10/2013 1437   EOSABS 0.0 06/10/2013 1437   BASOSABS 0.0 06/10/2013 1437    Micro Results: Recent Results (from the past 240 hour(s))  URINE CULTURE     Status: None   Collection Time    06/17/13  2:38 PM      Result Value Ref Range Status   Specimen Description URINE, CATHETERIZED   Final   Special Requests NONE   Final   Culture  Setup Time     Final   Value: 06/17/2013 19:02     Performed at [facility]     Final   Value: NO GROWTH     Performed at [facility]   Culture     Final   Value: NO GROWTH     Performed at [facility]   Report Status 06/18/2013 FINAL   Final    Medications: I have reviewed the patient's current medications. Scheduled Meds: . atorvastatin  20 mg Oral q1800  . benazepril  20 mg Oral q morning - 10a  . cholecalciferol  1,000 Units Oral Daily  . metoprolol tartrate  25 mg Oral BID  . omega-3 acid ethyl esters  1 g Oral Daily  . sodium chloride  3 mL Intravenous Q12H   Continuous Infusions: . sodium chloride 100 mL/hr at 06/19/13 1908   PRN Meds:.acetaminophen  Assessment/Plan: **Hematuria - Patient had no gross hematuria on admission. Gross hematuria began after Lovenox initiation, likely from recent TURPS procedure. Urology consulted and CBI initiated. Due to persistence, urology recommended IVC filter and discontinuing anticoagulation to stop bleeding (anticoagulation held). On 4/8 hematuria looked to be improving, CBI was temporarily discontinued. Following morning, urine in foley tubing was brown and consistent with old blood leading to urology removing foley catheter. Urine was collected throughout 4/9-10, hematuria had remitted, and urine output was appropriate (no retention). Urology signed off on patient (4/10) with recommendation for close outpatient follow-up with Dr. Gaynelle Arabian.  - Urology: thank you for recommendations   ** Acute on  Chronic Normocytic Anemia - Patient presented with hemoglobin near baseline of 12. In the setting of hematuria and anticoagulation therapy, hemoglobin dropped to 9.6 (4/1). Hematuria persisted despite CBI and Hgb dropped to 7.5 leading to a 1 unit pRBC transfusion (Hgb up to 8.8). Another 1 unit of pRBC was transfused on 4/8 when Hgb dropped to 7.3 (Hgb up to 8.2 following transfusion). Total of 2 units transfused during admission. Hgb of 8.2 after hematuria remitted and he was ready for discharge. (4/10).   **Acute LLE DVT - Patient presented with acute left LE swelling and pain s/p a TURP procedure (05/19/13). On presentation pain and swelling had progressed to the hip. Bilateral duplexes were obtained revealing left lower extremity DVT of the saphenofemoral junction, common femoral, profunda, femoral, popliteal, posterior tibial, and peroneal vein. Lovenox and compression stocking initially started (4/1) and then bridged to Coumadin (4/2). His edema and pain resolved but hematuria began during this period of anticoagulation. Due to persistent hematuria, and H&H down-trend, urology recommended discontinuing the anticoagulation and playing IVC filter. IVC filter placed (4/7). Per urology recommendations, since IVC filter is in place, anticoagulation can be resumed 1 week after remittance of hematuria and catheter removal. Thank you for recommendations.  - Compression stockings in place   ** Frequent falls in setting of Cerebellar Stroke - Patient had a CVA in 2013 which led to frequent falls and the requirement of walker use. He is currently at baseline neurological status. PT/OT recommended SNF placement after discharge.   ** Hypertension - Hypertensive on presentation, currently normotensive. Continued home benazepril 20 mg daily and home metoprolol tartrate 25 mg BID. Resolved.   ** Chronic Hypercalcemia - Patient with chronic elevated calcium levels and PTH on 04/24/13 that was high-normal (47.3), most  likely due to primary hyperparathyroidism. Patient also with history of kidney stones. He takes cholecalciferol 1000 U daily at home. Calcium normalized to 9.9 during hospitalization. Albumin found to be 2.0, thus corrected calcium is 11.5. Obtained 25-OH Vitamin D (normal) and 1,25 OH-Vitamin D levels (low normal). PTHrP level obtained to rule out hypercalcemia of malignancy which was normal (4/8).  - Obtaining IFE (Urine) and SPEP for further work-up   ** Chronic Kidney Disease Stage 2 - CKD stable at presentation. Creatinine near baseline of 1.0 with normal urine output.  - Monitor renal function and volume status.   ** Hyperlipidemia - Last lipid panel on 07/20/11 (normal).  -Continued home atorvastatin 20 mg daily   Diet: Heart  Code: Full  Dispo: Patient ready for discharge. He has SNF placement ready. - Patient will see PCP Joni Reining, DO) for follow-up appointment. Patient does have transportation limitations that hinder transportation to clinic appointments    This is a Careers information officer Note.  The care of the patient was discussed with Dr. Eulas Post and the assessment and  plan formulated with their assistance.  Please see their attached note for official documentation of the daily encounter.   LOS: 10 days   Isac Sarna, Med Student 06/20/2013, 12:05 PM

## 2013-06-20 NOTE — Progress Notes (Signed)
Clinical Social Worker facilitated patient discharge by contacting the patient, family and facility, [facility]. Patient agreeable's daughter to this plan and arranging transport via EMS . CSW will sign off, as social work intervention is no longer needed.  [name], MSW, [place]

## 2013-06-20 NOTE — Progress Notes (Signed)
Patient now with condom catheter.  Urine draining clear yellow.  Would recommend a bladder scan, again to ensure that he's not in retention/overflow.  He was no incontinent prior to his procedure.  If he's discharged today I will ensure that he has follow-up with Dr. Gaynelle Arabian in the next few weeks.

## 2013-06-20 NOTE — Progress Notes (Signed)
Report called to [facility] SNF to [place], [place]. Time allowed for questions/concerns. Tye stated she had none. Transportation will be arriving in approx. 20 minutes.  Roselyn Reef Fahd Galea,RN

## 2013-06-23 ENCOUNTER — Non-Acute Institutional Stay (SKILLED_NURSING_FACILITY): Payer: Medicare Other | Admitting: Internal Medicine

## 2013-06-23 ENCOUNTER — Encounter: Payer: Self-pay | Admitting: Internal Medicine

## 2013-06-23 DIAGNOSIS — N183 Chronic kidney disease, stage 3 unspecified: Secondary | ICD-10-CM | POA: Insufficient documentation

## 2013-06-23 DIAGNOSIS — R310 Gross hematuria: Secondary | ICD-10-CM

## 2013-06-23 DIAGNOSIS — D649 Anemia, unspecified: Secondary | ICD-10-CM

## 2013-06-23 DIAGNOSIS — Z9181 History of falling: Secondary | ICD-10-CM

## 2013-06-23 DIAGNOSIS — N182 Chronic kidney disease, stage 2 (mild): Secondary | ICD-10-CM

## 2013-06-23 DIAGNOSIS — I82409 Acute embolism and thrombosis of unspecified deep veins of unspecified lower extremity: Secondary | ICD-10-CM

## 2013-06-23 DIAGNOSIS — R296 Repeated falls: Secondary | ICD-10-CM

## 2013-06-23 DIAGNOSIS — I10 Essential (primary) hypertension: Secondary | ICD-10-CM

## 2013-06-23 DIAGNOSIS — E785 Hyperlipidemia, unspecified: Secondary | ICD-10-CM | POA: Insufficient documentation

## 2013-06-23 NOTE — Assessment & Plan Note (Signed)
Pt with no gross hematuria at the time of admission until Lovenox started for DVT, likely bleeding from the prostate given his recent TURPs. Urology consutled and placed catheter and started CBI. Holding Plavix, Lovenox, and Coumadin. IVC filter placed 4/6 by IR. CBI stopped 4/8 and catheter removed 4/9. No further hematuria, Hgb stable. He will need Urology f/u after discharge which will be scheduled by Urology.  - F/u w/ Urology, appreciate recommendations

## 2013-06-23 NOTE — Assessment & Plan Note (Signed)
Baseline Hgb around 12. H/o TURPs 3/9 with hematuria that has resolved per the daughter. No gross hematuria on admission with Hgb 11.6, 12.2 prior to procedure. Lovenox/Coumadin started on admission and Hgb dropped below 7.5, requiring transfusion of a total of 2u pRBCs this admission. Improved to 8.2 and remains stable. VSS.  Recent Labs  Lab  06/18/13 0700  06/18/13 1957  06/18/13 2343  06/19/13 1114  06/20/13 0423   HGB  7.3*  8.2*  8.2*  8.9*  8.2*

## 2013-06-23 NOTE — Progress Notes (Signed)
MRN: [id number] Name: [name]  Sex: male Age: 80 y.o. DOB: 1924/09/18  [place] #: Helene [facility] Facility/Room: 129A Level Of Care: SNF Provider: Hennie Duos Emergency Contacts: Extended Emergency Contact Information Primary Emergency Contact: Rembert,[name] Address: [address]          [place], [place] [address] Johnnette Litter of [place] Phone: [phone] Work Phone: [phone] Mobile Phone: [phone] Relation: Daughter    Allergies: Review of patient's allergies indicates no known allergies.  Chief Complaint  Patient presents with  . nursing home admission    HPI: Patient is 80 y.o. male who recently with TURP, then DVT, then plavix, then hematuria, then IVC filter then admit to SNF for OT/PT.  Past Medical History  Diagnosis Date  . Hypertension   . Difficulty hearing, right     BILATERAL HEARING LOSS - BEST TO TRY TO SPEAK INTO LEFT EAR  . Meningioma   . Frequent falls   . BPH (benign prostatic hypertrophy)     TURP 05/19/13  . Chronic kidney disease     CHRONIC KIDNEY DISEASE, 2  . Anemia   . Thrombocytopenia   . Stroke     Cerebellar, 2013; WALKS WITH WALKER, ABLE TO DRESS AND BATHE HIMSELF BUT FAMILY TRIES TO PROVIDE SUPERVISION BECAUSE OF HIS HX OF FALL AND WEAKNESS LEGS, ARMS   . Incontinence of urine     SOME INCONTINENCE  . Hyperlipidemia     Past Surgical History  Procedure Laterality Date  . Tube put in ear yrs ago    . Transurethral resection of prostate N/A 05/19/2013    Procedure: TRANSURETHRAL RESECTION OF THE PROSTATE WITH GYRUS INSTRUMENTS;  Surgeon: Ailene Rud, MD;  Location: [facility];  Service: Urology;  Laterality: N/A;  . Cystoscopy N/A 06/13/2013    Procedure: CYSTOSCOPY FLEXIBLE BEDSIDE;  Surgeon: Ardis Hughs, MD;  Location: [facility];  Service: Urology;  Laterality: N/A;      Medication List       This list is accurate as of: 06/23/13 10:32 PM.  Always use your most recent med list.                atorvastatin 20 MG tablet  Commonly known as:  LIPITOR  Take 20 mg by mouth daily at 6 PM.     benazepril 20 MG tablet  Commonly known as:  LOTENSIN  Take 20 mg by mouth every morning.     clopidogrel 75 MG tablet  Commonly known as:  PLAVIX  Take 75 mg by mouth daily with breakfast.     GERITOL PO  Take 1 tablet by mouth daily.     metoprolol tartrate 25 MG tablet  Commonly known as:  LOPRESSOR  Take 25 mg by mouth 2 (two) times daily.     omega-3 acid ethyl esters 1 G capsule  Commonly known as:  LOVAZA  Take 1 g by mouth daily.     sulfamethoxazole-trimethoprim 800-160 MG per tablet  Commonly known as:  BACTRIM DS  Take 1 tablet by mouth 2 (two) times daily.     traMADol-acetaminophen 37.5-325 MG per tablet  Commonly known as:  ULTRACET  Take 1 tablet by mouth every 6 (six) hours as needed.     VITAMIN D PO  Take 1 tablet by mouth daily.        Meds ordered this encounter  Medications  . traMADol-acetaminophen (ULTRACET) 37.5-325 MG per tablet    Sig: Take 1 tablet by mouth every 6 (  six) hours as needed.  . sulfamethoxazole-trimethoprim (BACTRIM DS) 800-160 MG per tablet    Sig: Take 1 tablet by mouth 2 (two) times daily.    Immunization History  Administered Date(s) Administered  . Influenza,inj,Quad PF,36+ Mos 02/03/2013  . Pneumococcal Polysaccharide-23 02/03/2013    History  Substance Use Topics  . Smoking status: Former Smoker    Types: Cigarettes  . Smokeless tobacco: Not on file  . Alcohol Use: No     Comment: QUIT SMOKING 62 YRS AGO    Family history is noncontributory    Review of Systems  DATA OBTAINED: from patient, no c/o GENERAL: Feels well no fevers, fatigue, appetite changes SKIN: No itching, rash or wounds EYES: No eye pain, redness, discharge EARS: No earache, tinnitus, change in hearing NOSE: No congestion, drainage or bleeding  MOUTH/THROAT: No mouth or tooth pain, No sore throat RESPIRATORY: No cough, wheezing,  SOB CARDIAC: No chest pain, palpitations, lower extremity edema  GI: No abdominal pain, No N/V/D or constipation, No heartburn or reflux  GU: No dysuria, frequency or urgency, or incontinence  MUSCULOSKELETAL: No unrelieved bone/joint pain NEUROLOGIC: No headache, dizziness or focal weakness PSYCHIATRIC: No overt anxiety or sadness. Sleeps well. No behavior issue.   Filed Vitals:   06/23/13 2207  BP: 122/68  Pulse: 77  Temp: 97.6 F (36.4 C)  Resp: 18    Physical Exam  GENERAL APPEARANCE: Alert, mod conversant. Appropriately groomed. No acute distress.  SKIN: No diaphoresis rash HEAD: Normocephalic, atraumatic  EYES: Conjunctiva/lids clear. Pupils round, reactive. EOMs intact.  EARS: External exam WNL, canals clear. Hearing grossly normal.  NOSE: No deformity or discharge.  MOUTH/THROAT: Lips w/o lesions RESPIRATORY: Breathing is even, unlabored. Lung sounds are clear   CARDIOVASCULAR: Heart RRR no murmurs, rubs or gallops. No peripheral edema.  GASTROINTESTINAL: Abdomen is soft, non-tender, not distended w/ normal bowel sounds GENITOURINARY: Bladder non tender, not distended  MUSCULOSKELETAL: No abnormal joints or musculature NEUROLOGIC: . Cranial nerves 2-12 grossly intact PSYCHIATRIC: Mood and affect appropriate to situation, no behavioral issues  Patient Active Problem List   Diagnosis Date Noted  . CKD (chronic kidney disease), stage II 06/23/2013  . Hyperlipidemia   . Urinary retention 06/15/2013  . Hypercalcemia 06/13/2013  . Hematuria, gross 06/13/2013  . Normocytic anemia 06/11/2013  . Leg DVT (deep venous thromboembolism), acute 06/10/2013  . Physical deconditioning 05/30/2013  . Benign prostatic hypertrophy 05/19/2013  . BPH (benign prostatic hyperplasia) 04/24/2013  . Preoperative examination 04/24/2013  . Primary hyperparathyroidism 09/02/2012  . Mild anemia and thrombocytopenia 08/30/2012  . Cerumen impaction 08/30/2012  . Health care maintenance  08/30/2012  . Frequent falls 06/23/2012  . Thrombotic stroke involving right cerebellar artery 09/15/2011  . 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 Hearing loss 07/20/2011  . Meningioma 07/20/2011  . HTN (hypertension) 07/19/2011  . CVA (cerebral infarction) 07/19/2011    CBC    Component Value Date/Time   WBC 5.7 06/20/2013 0423   RBC 2.67* 06/20/2013 0423   HGB 8.2* 06/20/2013 0423   HCT 24.6* 06/20/2013 0423   PLT 181 06/20/2013 0423   MCV 92.1 06/20/2013 0423   LYMPHSABS 1.4 06/10/2013 1437   MONOABS 0.5 06/10/2013 1437   EOSABS 0.0 06/10/2013 1437   BASOSABS 0.0 06/10/2013 1437    CMP     Component Value Date/Time   NA 143 06/18/2013 0700   K 3.9 06/18/2013 0700   CL 107 06/18/2013 0700   CO2 28 06/18/2013 0700   GLUCOSE  101* 06/18/2013 0700   BUN 14 06/18/2013 0700   CREATININE 1.11 06/18/2013 0700   CREATININE 0.94 04/24/2013 1648   CALCIUM 10.1 06/18/2013 0700   PROT 6.2 04/24/2013 1648   ALBUMIN 2.0* 06/13/2013 0430   AST 37 04/24/2013 1648   ALT 28 04/24/2013 1648   ALKPHOS 56 04/24/2013 1648   BILITOT 0.6 04/24/2013 1648   GFRNONAA 58* 06/18/2013 0700   GFRNONAA 73 04/24/2013 1648   GFRAA 67* 06/18/2013 0700   GFRAA 85 04/24/2013 1648    Assessment and Plan  Leg DVT (deep venous thromboembolism), acute   Patient Information    Patient Name Sex DOB SSN    Teofilo, Paille Male 29-Apr-1924 [id number]       Progress Notes by Otho Bellows, MD at 06/20/2013 12:18 PM    Author: Otho Bellows, MD Service: Internal Medicine Author Type: Resident    Filed: 06/20/2013  1:42 PM Note Time: 06/20/2013 12:18 PM Status: Addendum    Editor: Otho Bellows, MD (Resident)        Related Notes: Original Note by Otho Bellows, MD (Resident) filed at 06/20/2013 12:52 PM       Subjective: Catheter removed and pt voiding with condom cath. Hematuria has resolved. Pt denies any pain or SOB.      Objective: Vital signs in last 24 hours: Filed Vitals:     06/19/13 1500   06/19/13 2100  06/19/13 2106  06/20/13 0242   BP:  127/70  118/76  126/69  110/79   Pulse:  66  66  76  71   Temp:  97.9 F (36.6 C)    98.4 F (36.9 C)  98.1 F (36.7 C)   TempSrc:  Oral    Oral  Oral   Resp:  16    18  18    Height:           Weight:           SpO2:  98%    97%  98%      Weight change:    Intake/Output Summary (Last 24 hours) at 06/20/13 1218 Last data filed at 06/20/13 1000   Gross per 24 hour   Intake    1240 ml   Output    2100 ml   Net    -860 ml      Vitals reviewed. General: Lying in bed, NAD HEENT: PERRL, EOMI Cardiac: RRR, no rubs, murmurs or gallops Pulm: Clear to auscultation bilaterally, no wheezes, rales, or rhonchi Abd: Soft, nontender, non-distended GU: Condom catheter in place with clear yellow urine Ext: Warm and well perfused. LLE with no appreciable edema; no erythema. TED hose in place bilaterally. Decreased pain in the with strong DP pulses in BLE. Moves all 4 extremities. Neuro: Very hard of hearing. Alert and oriented to person, cranial nerves II-XII grossly intact   Lab Results: Basic Metabolic Panel:   Recent Labs Lab  06/14/13 0502  06/18/13 0700   NA  143  143   K  3.8  3.9   CL  105  107   CO2  27  28   GLUCOSE  102*  101*   BUN  17  14   CREATININE  1.06  1.11   CALCIUM  9.9  10.1      CBC:   Recent Labs Lab  06/19/13 1114  06/20/13 0423   WBC  5.7  5.7   HGB  8.9*  8.2*  HCT  26.3*  24.6*   MCV  92.0  92.1   PLT  177  181      Coagulation:   Recent Labs Lab  06/17/13 0535  06/18/13 0700  06/19/13 0915  06/20/13 0423   LABPROT  14.9  16.1*  14.3  14.7   INR  1.20  1.32  1.13  1.17      Urinalysis: No results found for this basename: COLORURINE, APPERANCEUR, LABSPEC, PHURINE, GLUCOSEU, HGBUR, BILIRUBINUR, KETONESUR, PROTEINUR, UROBILINOGEN, NITRITE, LEUKOCYTESUR,  in the last 168 hours   Micro Results: Recent Results (from the past 240 hour(s))   URINE CULTURE     Status: None      Collection Time      06/17/13  2:38 PM       Result  Value  Ref Range  Status     Specimen Description  URINE, CATHETERIZED     Final     Special Requests  NONE     Final     Culture  Setup Time        Final     Value:  06/17/2013 19:02        Performed at [facility]        Final     Value:  NO GROWTH        Performed at [facility]     Culture        Final     Value:  NO GROWTH        Performed at [facility]     Report Status  06/18/2013 FINAL     Final        Studies/Results: No results found.   Medications: I have reviewed the patient's current medications. Scheduled Meds: .  atorvastatin   20 mg  Oral  q1800   .  benazepril   20 mg  Oral  q morning - 10a   .  cholecalciferol   1,000 Units  Oral  Daily   .  metoprolol tartrate   25 mg  Oral  BID   .  omega-3 acid ethyl esters   1 g  Oral  Daily   .  sodium chloride   3 mL  Intravenous  Q12H      Continuous Infusions: .  sodium chloride  100 mL/hr at 06/19/13 1908      PRN Meds:.   Assessment/Plan: 80yo M w/ PMH HTN, BPH s/p TURP on 05/19/13, cerebellar stroke '13, CKD Stage II, and HLD who presented with acute LLE edema and pain and was found to have large clot burden in her LLE venous system and has persistent gross hematuria on anticoagulation.    #Gross hematuria: Resolved. Pt with no gross hematuria at the time of admission until Lovenox started for DVT, likely bleeding from the prostate given his recent TURPs. Urology consutled and placed catheter and started CBI. Holding Plavix, Lovenox, and Coumadin. IVC filter placed 4/6 by IR. CBI stopped 4/8 and catheter removed 4/9. No further hematuria, Hgb stable. He will need Urology f/u after discharge which will be scheduled by Urology.   - F/u w/ Urology, appreciate recommendations.     #Acute on chronic anemia, normocytic: Baseline Hgb around 12. H/o TURPs 3/9 with hematuria that has resolved per the daughter. No gross  hematuria on admission with Hgb 11.6, 12.2 prior to procedure. Lovenox/Coumadin started on admission and Hgb dropped below 7.5, requiring transfusion of  a total of 2u pRBCs this admission. Improved to 8.2 and remains stable. VSS.   Recent Labs Lab  06/18/13 0700  06/18/13 1957  06/18/13 2343  06/19/13 1114  06/20/13 0423   HGB  7.3*  8.2*  8.2*  8.9*  8.2*        #Acute LLE DVT: Pt s/p TURPs on 05/19/13 for BPH. He was brought to the ED on 3/31 with significant edema and pain. BLE dopplers were ordered and reveled a large clot burden throughout the left leg. Pt started on Lovenox and Coumadin which have been held 2/2 hematuria. IVC filter by IR 4/6. LLE edema and pain are resolved. Per Urology, likely restart anticoagulation in 1 week now that the hematuria has resolved. Once the anticoagulation is restarted and he has been without reoccurrence of the hematuria for a few weeks, his IVC filter may be able to be removed.   - Continuing to hold anticoagulation, IVC filter in place Compression stockings     Hematuria, gross Pt with no gross hematuria at the time of admission until Lovenox started for DVT, likely bleeding from the prostate given his recent TURPs. Urology consutled and placed catheter and started CBI. Holding Plavix, Lovenox, and Coumadin. IVC filter placed 4/6 by IR. CBI stopped 4/8 and catheter removed 4/9. No further hematuria, Hgb stable. He will need Urology f/u after discharge which will be scheduled by Urology.  - F/u w/ Urology, appreciate recommendations   Normocytic anemia Baseline Hgb around 12. H/o TURPs 3/9 with hematuria that has resolved per the daughter. No gross hematuria on admission with Hgb 11.6, 12.2 prior to procedure. Lovenox/Coumadin started on admission and Hgb dropped below 7.5, requiring transfusion of a total of 2u pRBCs this admission. Improved to 8.2 and remains stable. VSS.  Recent Labs  Lab  06/18/13 0700  06/18/13 1957  06/18/13 2343   06/19/13 1114  06/20/13 0423   HGB  7.3*  8.2*  8.2*  8.9*  8.2*      Hypercalcemia Ca this admission elevated to 11.4 with previously elevated levels. PTH normal in Feb,and Ca normalized to 9.9. Checked Vit D levels, and PTH-related protein ordered to help determine if his hypercalcemia could be related to malignancy. - Vit D levels and PTH-related peptide normal. Checking SPEP and UPEP to r/o possible malignancy. UPEP shows no monoclonal free light chains but did have elevated kappa light chains and kappa/lambda ratio. Normal urine protein level. 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 Consider checking serum IPE for further evaluation as an outpatient  PT HAS H/O HYPERPARATHYROID BEING FOLLOWED BY HIS PCP   Frequent falls H/o CVA in 2013 which has led to frequent falls and the requirement of a walker. He is currently at baseline neurological status. PT/OT consulted in the hospital and rec SNF placement.  - Continue PT  - SNF at d/c and continue PT there   HTN (hypertension) continue lopressor and lotensin  CKD (chronic kidney disease), stage II At baseline Cr of 1.0  Hyperlipidemia Continue lipitor    Hennie Duos, MD

## 2013-06-23 NOTE — Assessment & Plan Note (Signed)
H/o CVA in 2013 which has led to frequent falls and the requirement of a walker. He is currently at baseline neurological status. PT/OT consulted in the hospital and rec SNF placement.  - Continue PT  - SNF at d/c and continue PT there

## 2013-06-23 NOTE — Assessment & Plan Note (Signed)
Continue lipitor  ?

## 2013-06-23 NOTE — Assessment & Plan Note (Addendum)
Ca this admission elevated to 11.4 with previously elevated levels. PTH normal in Feb,and Ca normalized to 9.9. Checked Vit D levels, and PTH-related protein ordered to help determine if his hypercalcemia could be related to malignancy. - Vit D levels and PTH-related peptide normal. Checking SPEP and UPEP to r/o possible malignancy. UPEP shows no monoclonal free light chains but did have elevated kappa light chains and kappa/lambda ratio. Normal urine protein level. 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 Consider checking serum IPE for further evaluation as an outpatient  PT HAS H/O HYPERPARATHYROID BEING FOLLOWED BY HIS PCP

## 2013-06-23 NOTE — Assessment & Plan Note (Signed)
At baseline Cr of 1.0

## 2013-06-23 NOTE — Assessment & Plan Note (Addendum)
   #  Acute LLE DVT: Pt s/p TURPs on 05/19/13 for BPH. He was brought to the ED on 3/31 with significant edema and pain. BLE dopplers were ordered and reveled a large clot burden throughout the left leg. Pt started on Lovenox and Coumadin which have been held 2/2 hematuria. IVC filter by IR 4/6. LLE edema and pain are resolved. Per Urology, likely restart anticoagulation in 1 week now that the hematuria has resolved. Once the anticoagulation is restarted and he has been without reoccurrence of the hematuria for a few weeks, his IVC filter may be able to be removed.   - Continuing to hold anticoagulation, IVC filter in place Compression stockings

## 2013-06-23 NOTE — Assessment & Plan Note (Signed)
continue lopressor and lotensin

## 2013-07-02 ENCOUNTER — Other Ambulatory Visit: Payer: Self-pay | Admitting: *Deleted

## 2013-07-02 MED ORDER — TRAMADOL-ACETAMINOPHEN 37.5-325 MG PO TABS: ORAL_TABLET | ORAL | Status: DC

## 2013-07-02 NOTE — Telephone Encounter (Signed)
[facility] 

## 2013-07-03 ENCOUNTER — Encounter: Payer: Medicare Other | Admitting: Internal Medicine

## 2013-07-08 ENCOUNTER — Encounter (HOSPITAL_COMMUNITY): Payer: Self-pay | Admitting: Emergency Medicine

## 2013-07-08 ENCOUNTER — Observation Stay (HOSPITAL_COMMUNITY): Payer: Medicare Other

## 2013-07-08 ENCOUNTER — Emergency Department (HOSPITAL_COMMUNITY): Payer: Medicare Other

## 2013-07-08 ENCOUNTER — Observation Stay (HOSPITAL_COMMUNITY)
Admission: EM | Admit: 2013-07-08 | Discharge: 2013-07-10 | Disposition: A | Discharge status: Home / Self Care | Payer: Medicare Other | Attending: Internal Medicine | Admitting: Internal Medicine

## 2013-07-08 DIAGNOSIS — I129 Hypertensive chronic kidney disease with stage 1 through stage 4 chronic kidney disease, or unspecified chronic kidney disease: Secondary | ICD-10-CM | POA: Insufficient documentation

## 2013-07-08 DIAGNOSIS — Z9181 History of falling: Secondary | ICD-10-CM | POA: Insufficient documentation

## 2013-07-08 DIAGNOSIS — R231 Pallor: Secondary | ICD-10-CM | POA: Insufficient documentation

## 2013-07-08 DIAGNOSIS — Z7902 Long term (current) use of antithrombotics/antiplatelets: Secondary | ICD-10-CM | POA: Insufficient documentation

## 2013-07-08 DIAGNOSIS — D696 Thrombocytopenia, unspecified: Secondary | ICD-10-CM | POA: Insufficient documentation

## 2013-07-08 DIAGNOSIS — N183 Chronic kidney disease, stage 3 unspecified: Secondary | ICD-10-CM | POA: Diagnosis present

## 2013-07-08 DIAGNOSIS — R55 Syncope and collapse: Secondary | ICD-10-CM | POA: Insufficient documentation

## 2013-07-08 DIAGNOSIS — N182 Chronic kidney disease, stage 2 (mild): Secondary | ICD-10-CM | POA: Insufficient documentation

## 2013-07-08 DIAGNOSIS — I951 Orthostatic hypotension: Principal | ICD-10-CM | POA: Diagnosis present

## 2013-07-08 DIAGNOSIS — Z87891 Personal history of nicotine dependence: Secondary | ICD-10-CM | POA: Insufficient documentation

## 2013-07-08 DIAGNOSIS — Z79899 Other long term (current) drug therapy: Secondary | ICD-10-CM | POA: Insufficient documentation

## 2013-07-08 DIAGNOSIS — Z8673 Personal history of transient ischemic attack (TIA), and cerebral infarction without residual deficits: Secondary | ICD-10-CM | POA: Insufficient documentation

## 2013-07-08 DIAGNOSIS — H9190 Unspecified hearing loss, unspecified ear: Secondary | ICD-10-CM | POA: Insufficient documentation

## 2013-07-08 DIAGNOSIS — D320 Benign neoplasm of cerebral meninges: Secondary | ICD-10-CM | POA: Insufficient documentation

## 2013-07-08 DIAGNOSIS — G9340 Encephalopathy, unspecified: Secondary | ICD-10-CM | POA: Diagnosis present

## 2013-07-08 DIAGNOSIS — F0390 Unspecified dementia without behavioral disturbance: Secondary | ICD-10-CM | POA: Insufficient documentation

## 2013-07-08 DIAGNOSIS — N400 Enlarged prostate without lower urinary tract symptoms: Secondary | ICD-10-CM | POA: Insufficient documentation

## 2013-07-08 DIAGNOSIS — E875 Hyperkalemia: Secondary | ICD-10-CM | POA: Diagnosis present

## 2013-07-08 DIAGNOSIS — E785 Hyperlipidemia, unspecified: Secondary | ICD-10-CM | POA: Insufficient documentation

## 2013-07-08 DIAGNOSIS — D649 Anemia, unspecified: Secondary | ICD-10-CM | POA: Insufficient documentation

## 2013-07-08 LAB — CBC WITH DIFFERENTIAL/PLATELET
Basophils Absolute: 0 10*3/uL (ref 0.0–0.1)
Basophils Relative: 0 % (ref 0–1)
Eosinophils Absolute: 0 10*3/uL (ref 0.0–0.7)
Eosinophils Relative: 0 % (ref 0–5)
HCT: 29.8 % — ABNORMAL LOW (ref 39.0–52.0)
Hemoglobin: 9.4 g/dL — ABNORMAL LOW (ref 13.0–17.0)
Lymphocytes Relative: 12 % (ref 12–46)
Lymphs Abs: 1.3 10*3/uL (ref 0.7–4.0)
MCH: 29.5 pg (ref 26.0–34.0)
MCHC: 31.5 g/dL (ref 30.0–36.0)
MCV: 93.4 fL (ref 78.0–100.0)
Monocytes Absolute: 0.6 10*3/uL (ref 0.1–1.0)
Monocytes Relative: 6 % (ref 3–12)
Neutro Abs: 8.4 10*3/uL — ABNORMAL HIGH (ref 1.7–7.7)
Neutrophils Relative %: 82 % — ABNORMAL HIGH (ref 43–77)
Platelets: 224 10*3/uL (ref 150–400)
RBC: 3.19 MIL/uL — ABNORMAL LOW (ref 4.22–5.81)
RDW: 15.4 % (ref 11.5–15.5)
WBC: 10.3 10*3/uL (ref 4.0–10.5)

## 2013-07-08 LAB — TROPONIN I
Troponin I: <0.3 ng/mL (ref <0.30)
Troponin I: <0.3 ng/mL (ref <0.30)

## 2013-07-08 LAB — BASIC METABOLIC PANEL
BUN: 31 mg/dL — ABNORMAL HIGH (ref 6–23)
CO2: 25 mEq/L (ref 19–32)
Calcium: 13.1 mg/dL (ref 8.4–10.5)
Chloride: 100 mEq/L (ref 96–112)
Creatinine, Ser: 2.58 mg/dL — ABNORMAL HIGH (ref 0.50–1.35)
GFR calc Af Amer: 24 mL/min — ABNORMAL LOW (ref >90)
GFR calc non Af Amer: 21 mL/min — ABNORMAL LOW (ref >90)
Glucose, Bld: 158 mg/dL — ABNORMAL HIGH (ref 70–99)
Potassium: 5.6 mEq/L — ABNORMAL HIGH (ref 3.7–5.3)
Sodium: 138 mEq/L (ref 137–147)

## 2013-07-08 LAB — POC OCCULT BLOOD, ED: Fecal Occult Bld: NEGATIVE

## 2013-07-08 LAB — CBG MONITORING, ED: Glucose-Capillary: 154 mg/dL — ABNORMAL HIGH (ref 70–99)

## 2013-07-08 MED ORDER — SODIUM CHLORIDE 0.9 % IV BOLUS (SEPSIS)
500.0000 mL | Freq: Once | INTRAVENOUS | Status: AC
  Administered 2013-07-08: 500 mL via INTRAVENOUS

## 2013-07-08 MED ORDER — SODIUM CHLORIDE 0.9 % IV SOLN
INTRAVENOUS | Status: DC
  Administered 2013-07-08 – 2013-07-09 (×3): via INTRAVENOUS

## 2013-07-08 MED ORDER — OMEGA-3-ACID ETHYL ESTERS 1 G PO CAPS
1.0000 g | ORAL_CAPSULE | Freq: Every day | ORAL | Status: DC
  Administered 2013-07-08 – 2013-07-09 (×2): 1 g via ORAL
  Filled 2013-07-08 (×3): qty 1

## 2013-07-08 MED ORDER — HEPARIN SODIUM (PORCINE) 5000 UNIT/ML IJ SOLN
5000.0000 [IU] | Freq: Three times a day (TID) | INTRAMUSCULAR | Status: DC
  Administered 2013-07-08 – 2013-07-09 (×2): 5000 [IU] via SUBCUTANEOUS
  Filled 2013-07-08 (×5): qty 1

## 2013-07-08 MED ORDER — SODIUM CHLORIDE 0.9 % IJ SOLN
3.0000 mL | Freq: Two times a day (BID) | INTRAMUSCULAR | Status: DC
  Administered 2013-07-09 – 2013-07-10 (×3): 3 mL via INTRAVENOUS

## 2013-07-08 MED ORDER — SODIUM CHLORIDE 0.9 % IV SOLN
Freq: Once | INTRAVENOUS | Status: AC
  Administered 2013-07-08: 15:00:00 via INTRAVENOUS

## 2013-07-08 MED ORDER — ATORVASTATIN CALCIUM 20 MG PO TABS
20.0000 mg | ORAL_TABLET | Freq: Every day | ORAL | Status: DC
  Administered 2013-07-08 – 2013-07-09 (×2): 20 mg via ORAL
  Filled 2013-07-08 (×3): qty 1

## 2013-07-08 MED ORDER — NYSTATIN 100000 UNIT/ML MT SUSP
5.0000 mL | Freq: Four times a day (QID) | OROMUCOSAL | Status: DC
  Administered 2013-07-08 – 2013-07-10 (×7): 500000 [IU] via ORAL
  Filled 2013-07-08 (×10): qty 5

## 2013-07-08 MED ORDER — CLOPIDOGREL BISULFATE 75 MG PO TABS
75.0000 mg | ORAL_TABLET | Freq: Every day | ORAL | Status: DC
  Administered 2013-07-09 – 2013-07-10 (×2): 75 mg via ORAL
  Filled 2013-07-08: qty 1

## 2013-07-08 NOTE — H&P (Signed)
Date: 07/08/2013               Patient Name:  [name] MRN: [id number]  DOB: 12-05-1924 Age / Sex: 80 y.o., male   PCP: Joni Reining, DO         Medical Service: Internal Medicine Teaching Service         Attending Physician: Dr. Bartholomew Crews, MD    First Contact: Dr. Mechele Claude Pager: [phone]  Second Contact: Dr. Eulas Post Pager: [phone]       After Hours (After 5p/  First Contact Pager: [phone]  weekends / holidays): Second Contact Pager: [phone]   Chief Complaint: AMS/syncope?  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  Patient worked with OT this morning and returned to his room at around 11:30am. His daughter came to see him and found him slumped over, less responsive than usual in his wheelchair at around [id number]. Per EMS, systolic BP in [id number], which increased to 80s by the time he was in ED. Patient's blood pressure was 96/58 s/p 1L NS when we saw him in the ED. Patient's mental status much improved on our exam. He was alert and oriented x 3. He endorses having generalized weakness and some lightheadedness, though this is not new and he states this is how he feels "all the time." Denies CP, abd pain, SOB, HA, vision changes, focal neurologic symptoms. According to the patient he has not had any recent falls. He has been eating and drinking normally. However, the note in the ED says patient had skipped breakfast and lunch (per daughter pt typically does skip breakfast).   Meds: No current facility-administered medications for this encounter.   Current Outpatient Prescriptions  Medication Sig Dispense Refill  . atorvastatin (LIPITOR) 20 MG tablet Take 20 mg by mouth daily at 6 PM.      .  benazepril (LOTENSIN) 20 MG tablet Take 20 mg by mouth every morning.      . bisacodyl (DULCOLAX) 10 MG suppository Place 10 mg rectally daily as needed for moderate constipation.      . Cholecalciferol (VITAMIN D PO) Take 1 tablet by mouth daily.      . clopidogrel (PLAVIX) 75 MG tablet Take 75 mg by mouth daily with breakfast.      . Iron-Vitamins (GERITOL PO) Take 1 tablet by mouth daily.      . magnesium hydroxide (MILK OF MAGNESIA) 800 MG/5ML suspension Take 30 mLs by mouth daily as needed for constipation.      . metoprolol tartrate (LOPRESSOR) 25 MG tablet Take 25 mg by mouth 2 (two) times daily.      Marland Kitchen omega-3 acid ethyl esters (LOVAZA) 1 G capsule Take 1 g by mouth daily.      . Sodium Phosphates (RA SALINE ENEMA) 19-7 GM/118ML ENEM Place 1 each rectally daily as needed (for constipation not relieved by Bisacodyl).      . traMADol-acetaminophen (ULTRACET) 37.5-325 MG per tablet Take 1 tablet by mouth every 6 (six) hours as needed for moderate pain.        Allergies: Allergies as of 07/08/2013  . (No Known Allergies)   Past Medical History  Diagnosis Date  . Hypertension   . Difficulty hearing,  right     BILATERAL HEARING LOSS - BEST TO TRY TO SPEAK INTO LEFT EAR  . Meningioma   . Frequent falls   . BPH (benign prostatic hypertrophy)     TURP 05/19/13  . Chronic kidney disease     CHRONIC KIDNEY DISEASE, 2  . Anemia   . Thrombocytopenia   . Stroke     Cerebellar, 2013; WALKS WITH WALKER, ABLE TO DRESS AND BATHE HIMSELF BUT FAMILY TRIES TO PROVIDE SUPERVISION BECAUSE OF HIS HX OF FALL AND WEAKNESS LEGS, ARMS   . Incontinence of urine     SOME INCONTINENCE  . Hyperlipidemia    Past Surgical History  Procedure Laterality Date  . Tube put in ear yrs ago    . Transurethral resection of prostate N/A 05/19/2013    Procedure: TRANSURETHRAL RESECTION OF THE PROSTATE WITH GYRUS INSTRUMENTS;  Surgeon: Ailene Rud, MD;  Location: [facility];  Service: Urology;  Laterality: N/A;    . Cystoscopy N/A 06/13/2013    Procedure: CYSTOSCOPY FLEXIBLE BEDSIDE;  Surgeon: Ardis Hughs, MD;  Location: [facility];  Service: Urology;  Laterality: N/A;   Family History  Problem Relation Age of Onset  . Diabetes Mother   . Heart disease Mother    History   Social History  . Marital Status: Widowed    Spouse Name: N/A    Number of Children: N/A  . Years of Education: N/A   Occupational History  . Not on file.   Social History Main Topics  . Smoking status: Former Smoker    Types: Cigarettes  . Smokeless tobacco: Not on file  . Alcohol Use: No     Comment: QUIT SMOKING 50 YRS AGO  . Drug Use: No  . Sexual Activity: Not on file   Other Topics Concern  . Not on file   Social History Narrative   Lives with daughter. Ambulates w walker. Can make basic meals (cereal). Very hard of hearing.     Review of Systems: A comprehensive 10 point ROS was performed and was limited 2/2 patient condition/understanding; however, negative other than what is noted in the HPI.   Physical Exam: Blood pressure 81/48, pulse 64, temperature 97.5 F (36.4 C), temperature source Oral, resp. rate 17, SpO2 98.00%. on room air General: thin, elderly man; alert, cooperative, and in no apparent distress HEENT: pupils equal round and reactive to light, vision grossly intact, tongue and posterior oropharynx with while film; oropharynx nonerythematous, mucous membranes dry Neck: supple Lungs: CTAB Heart: regular rate and rhythm, no murmurs, gallops, or rubs Abdomen: soft, thin, non-tender, non-distended, normal bowel sounds Extremities: warm, xerosis diffusely, no pedal edema Neurologic: alert & oriented X3, cranial nerves II-XII intact, strength difficult to assess though pt can lift all extremities and seemingly 4/5 strength thoughout; sensation intact to light touch; past pointing present on finger-nose bilaterally, R>L  Lab results: Basic Metabolic Panel:  Recent Labs  07/08/13 1309  NA  138  K 5.6*  CL 100  CO2 25  GLUCOSE 158*  BUN 31*  CREATININE 2.58*  CALCIUM 13.1*   CBC:  Recent Labs  07/08/13 1309  WBC 10.3  NEUTROABS 8.4*  HGB 9.4*  HCT 29.8*  MCV 93.4  PLT 224   Cardiac Enzymes:  Recent Labs  07/08/13 1309  TROPONINI <0.30   CBG:  Recent Labs  07/08/13 1331  GLUCAP 154*   Imaging results:  Dg Chest 2 View  07/08/2013   CLINICAL DATA:  Hypertension.  EXAM:  CHEST  2 VIEW  COMPARISON:  None.  FINDINGS: Mediastinum is normal. Mild hilar prominence is most likely secondary to tortuous pulmonary vessels. Borderline cardiomegaly, no pulmonary venous congestion. Left upper lobe infiltrate/ mass versus prominent left first costochondral calcification. Standard PA and lateral chest x-ray suggested for further evaluation. Apical lordotic chest x-ray may also prove useful. No pleural effusion or pneumothorax. No acute bony abnormality.  IMPRESSION: Ill-defined infiltrate/mass left upper lobe versus left anterior first rib prominent costochondral calcification. A standard PA and lateral chest x-ray is suggested for further evaluation. Apical lordotic chest x-ray may also prove useful .   Electronically Signed   By: Marcello Moores  Register   On: 07/08/2013 14:00   Other results: EKG:  PVCs with intraventricular conduction delay (long QRS 132ms); no ischemic changes and no change from prior EKG  Assessment & Plan by Problem:  # Hypotension with acute encephalopathy: Blood pressure improving, 38/75 from 64P systolic when presented to ED (s/p 1 L NS and NS running at 150s now). Pt afebrile with rest of VSS. Mental status much improved since receiving IVF in ED. Patient's daughter not in the room on our exam for further hx, though Dr. Eulas Post knows patient from prior admission and pt appears to be at mental status and functional baseline. Pt is A&O x 3 and appears to be generally weak with dysarthria and some cerebellar deficits, though this appears to be stable. No new  focal neurologic signs which makes stroke less likely. Patient has reported hx of skipping meals so ? Decreased PO intake. He is somewhat dry appearing on exam and blood pressure appears to be responding well to IVF so this may represent hypovolemia (also with likely prerenal AKI and hemoconcentrated labs). Given the unclear story and patient's age w/ Hx of HTN and HLD, will cycle troponins to rule out ACS (EKG showed now change from baseline). No symptoms of UTI, though elderly patients can present with AMS, so will assess for UTI. No evidence of PNA on CXR.  -admit to Tele for obs -attempt to call daughter to obtain further history -cycle troponins -check orthostatic vital signs -check UA  -CSW for placement (came from [place]) -CBC/BMP in AM -continue IVF NS 125cc/hr overnight (less concerned for volume overload as pt w/ Echo 07/2011 showing normal EF and grade 1 diastolic dysfunction, though will proceed with caution given AKI)  # Recent LLE DVT: Patient hospitalized 4/2-4/10 for LLE DVT. Hospital stay complicated by hematuria (pt s/p TURP for BPH in 05/2013) after anticoagulation initiated. Pt's anticoagulation was stopped and IVC filter placed. Plan was to restart anticoagulation if pt's hematuria resolved for at least 1 week. -check UA for hematuria -consider restarting anticoagulation if no hematuria   # HTN: Patient with hypotension to 80s in ED that is responding to IVF, blood pressure now in high 90s/50s. Pt on metoprolol 25mg  BID and Lotensin 20mg  daily.  Holding home antihypertensives given hypotension.  # Acute on Chronic CKD stage 2: Patient presents with Cr 2.58, with baseline Cr ~1. Most likely prerenal origin given labs appear hemoconcentrated, patient with questionable decreased PO intake, dry on exam and hypotension responding well to IVF.  -IVF -check FENa -consider renal U/S if no improvement after IVF  # Hx of CVA: Pt with residual ataxia (walks with walker) and dysarthria  from prior cerebellar stroke (in 2013). Stable. No new neurologic symptoms or physical exam findings. No concern for acute stroke at this time. -continue home plavix and lipitor   #  CXR abnormality: CXR on admission shows ill defined infiltrate vs mass in left upper lobe vs L anterior first rib prominent costochondral calcification (recommend repeat CXR w/ PA and lateral). Of note, CXR on 4/13 showed 2 possible RUL nodules. Denies cough, SOB, CP. -repeat 2 view CXR (PA and lateral)  #Hypercalcemia: Patient with calcium of 13.1 on admission, up from 10.1 on 4/8. Hx of hypercalcemia PTH 04/2013 wnl so not likely primary hyperparathyroidism (despite this problem on problem list?). UPEP and SPEP done during his last hospitalization, both negative for bence jones proteins. Vit D levels and PTH-related peptide normal 06/2013. Ca level has been as high as 11.4, never as high as it is today. Possibly exacerbated by some hypovolemia. -IVF as above -BMP in AM  # VTE: heparin  # Diet: heart healthy  Code status: full  Dispo: Disposition is deferred at this time, awaiting improvement of current medical problems. Anticipated discharge in approximately 1-2 day(s).   The patient does have a current PCP Joni Reining, DO) and does need an [facility] hospital follow-up appointment after discharge.  The patient does not have transportation limitations that hinder transportation to clinic appointments.  Signed: Rebecca Eaton, MD 07/08/2013, 4:06 PM

## 2013-07-08 NOTE — ED Notes (Signed)
Patient transported to X-ray 

## 2013-07-08 NOTE — ED Notes (Signed)
Admitting MDs at bedside.

## 2013-07-08 NOTE — ED Provider Notes (Signed)
CSN: [id number]     Arrival date & time 07/08/13  1234 History   First MD Initiated Contact with Patient 07/08/13 1246     Chief Complaint  Patient presents with  . Loss of Consciousness     (Consider location/radiation/quality/duration/timing/severity/associated sxs/prior Treatment) HPI Comments: Level 5 caveat due to dementia.  Pt is a International aid/development worker at [employer], recent diagnosis of a DVT in left leg, however had bleeding problems related to prior prostate surgery and now has a [place] filter, had occupational health therapy this AM, returned to room around 11:30, daughter came to visit and found him slumped over in wheelchair barely responsive.  Slowly improved after they had him laying down on bed and has slowly come back to near baseline.  Pt's initial BP was 40'J systolic.  Pt denies HA, CP, SOB.  No abd pain, back pain.  He may have skipped breakfast and hasn't eaten lunch either which daughter reports is typical for him to skip breakfast.    Patient is a 80 y.o. male presenting with syncope. The history is provided by the patient, a relative and medical records.  Loss of Consciousness   Past Medical History  Diagnosis Date  . Hypertension   . Difficulty hearing, right     BILATERAL HEARING LOSS - BEST TO TRY TO SPEAK INTO LEFT EAR  . Meningioma   . Frequent falls   . BPH (benign prostatic hypertrophy)     TURP 05/19/13  . Chronic kidney disease     CHRONIC KIDNEY DISEASE, 2  . Anemia   . Thrombocytopenia   . Stroke     Cerebellar, 2013; WALKS WITH WALKER, ABLE TO DRESS AND BATHE HIMSELF BUT FAMILY TRIES TO PROVIDE SUPERVISION BECAUSE OF HIS HX OF FALL AND WEAKNESS LEGS, ARMS   . Incontinence of urine     SOME INCONTINENCE  . Hyperlipidemia    Past Surgical History  Procedure Laterality Date  . Tube put in ear yrs ago    . Transurethral resection of prostate N/A 05/19/2013    Procedure: TRANSURETHRAL RESECTION OF THE PROSTATE WITH GYRUS INSTRUMENTS;  Surgeon: Ailene Rud, MD;  Location[facility];  Service: Urology;  Laterality: N/A;  . Cystoscopy N/A 06/13/2013    Procedure: CYSTOSCOPY FLEXIBLE BEDSIDE;  Surgeon: Ardis Hughs, MD;  Location: [facility];  Service: Urology;  Laterality: N/A;   Family History  Problem Relation Age of Onset  . Diabetes Mother   . Heart disease Mother    History  Substance Use Topics  . Smoking status: Former Smoker    Types: Cigarettes  . Smokeless tobacco: Not on file  . Alcohol Use: No     Comment: QUIT SMOKING 48 YRS AGO    Review of Systems  Unable to perform ROS: Dementia  Cardiovascular: Positive for syncope.      Allergies  Review of patient's allergies indicates no known allergies.  Home Medications   Prior to Admission medications   Medication Sig Start Date End Date Taking? Authorizing Provider  atorvastatin (LIPITOR) 20 MG tablet Take 20 mg by mouth daily at 6 PM.   Yes Historical Provider, MD  benazepril (LOTENSIN) 20 MG tablet Take 20 mg by mouth every morning.   Yes Historical Provider, MD  bisacodyl (DULCOLAX) 10 MG suppository Place 10 mg rectally daily as needed for moderate constipation.   Yes Historical Provider, MD  Cholecalciferol (VITAMIN D PO) Take 1 tablet by mouth daily.   Yes Historical Provider, MD  clopidogrel (PLAVIX)  75 MG tablet Take 75 mg by mouth daily with breakfast.   Yes Historical Provider, MD  Iron-Vitamins (GERITOL PO) Take 1 tablet by mouth daily.   Yes Historical Provider, MD  magnesium hydroxide (MILK OF MAGNESIA) 800 MG/5ML suspension Take 30 mLs by mouth daily as needed for constipation.   Yes Historical Provider, MD  metoprolol tartrate (LOPRESSOR) 25 MG tablet Take 25 mg by mouth 2 (two) times daily.   Yes Historical Provider, MD  omega-3 acid ethyl esters (LOVAZA) 1 G capsule Take 1 g by mouth daily.   Yes Historical Provider, MD  Sodium Phosphates (RA SALINE ENEMA) 19-7 GM/118ML ENEM Place 1 each rectally daily as needed (for constipation not relieved by  Bisacodyl).   Yes Historical Provider, MD  traMADol-acetaminophen (ULTRACET) 37.5-325 MG per tablet Take 1 tablet by mouth every 6 (six) hours as needed for moderate pain.   Yes Historical Provider, MD   BP 123/75  Pulse 75  Temp(Src) 97.2 F (36.2 C) (Oral)  Resp 16  Ht 5' 10.87" (1.8 m)  Wt 164 lb 14.5 oz (74.8 kg)  BMI 23.09 kg/m2  SpO2 95% Physical Exam  Nursing note and vitals reviewed. Constitutional: He appears well-developed and well-nourished. No distress.  HENT:  Head: Normocephalic and atraumatic.  Eyes: Conjunctivae are normal. Pupils are equal, round, and reactive to light. No scleral icterus.  Neck: Normal range of motion. Neck supple. No JVD present.  Cardiovascular: Normal rate, regular rhythm and intact distal pulses.   Pulmonary/Chest: Effort normal. No respiratory distress. He has no wheezes.  Abdominal: Soft. He exhibits no distension. There is no tenderness. There is no rebound and no guarding.  Musculoskeletal: He exhibits no tenderness.  Neurological: He is alert.  Skin: Skin is warm and dry. He is not diaphoretic. There is pallor.    ED Course  Procedures (including critical care time) Labs Review Labs Reviewed  CBC WITH DIFFERENTIAL - Abnormal; Notable for the following:    RBC 3.19 (*)    Hemoglobin 9.4 (*)    HCT 29.8 (*)    Neutrophils Relative % 82 (*)    Neutro Abs 8.4 (*)    All other components within normal limits  BASIC METABOLIC PANEL - Abnormal; Notable for the following:    Potassium 5.6 (*)    Glucose, Bld 158 (*)    BUN 31 (*)    Creatinine, Ser 2.58 (*)    Calcium 13.1 (*)    GFR calc non Af Amer 21 (*)    GFR calc Af Amer 24 (*)    All other components within normal limits  BASIC METABOLIC PANEL - Abnormal; Notable for the following:    Potassium 5.7 (*)    Glucose, Bld 114 (*)    BUN 34 (*)    Creatinine, Ser 2.47 (*)    Calcium 12.1 (*)    GFR calc non Af Amer 22 (*)    GFR calc Af Amer 26 (*)    All other components  within normal limits  CBC - Abnormal; Notable for the following:    RBC 2.90 (*)    Hemoglobin 8.6 (*)    HCT 27.1 (*)    All other components within normal limits  URINALYSIS, ROUTINE W REFLEX MICROSCOPIC - Abnormal; Notable for the following:    APPearance HAZY (*)    Leukocytes, UA MODERATE (*)    All other components within normal limits  BASIC METABOLIC PANEL - Abnormal; Notable for the following:  Potassium 5.5 (*)    Glucose, Bld 100 (*)    BUN 34 (*)    Creatinine, Ser 2.45 (*)    Calcium 12.2 (*)    GFR calc non Af Amer 22 (*)    GFR calc Af Amer 26 (*)    All other components within normal limits  URINE MICROSCOPIC-ADD ON - Abnormal; Notable for the following:    Squamous Epithelial / LPF FEW (*)    Bacteria, UA FEW (*)    Casts HYALINE CASTS (*)    All other components within normal limits  CBC - Abnormal; Notable for the following:    RBC 2.70 (*)    Hemoglobin 8.0 (*)    HCT 25.0 (*)    RDW 15.6 (*)    All other components within normal limits  CBG MONITORING, ED - Abnormal; Notable for the following:    Glucose-Capillary 154 (*)    All other components within normal limits  URINE CULTURE  BODY FLUID CULTURE  GRAM STAIN  TROPONIN I  SODIUM, URINE, RANDOM  CREATININE, URINE, RANDOM  TROPONIN I  TROPONIN I  BASIC METABOLIC PANEL  POC OCCULT BLOOD, ED  CYTOLOGY - NON PAP    Imaging Review Dg Chest 1 View    Dg Chest 2 View  07/08/2013   CLINICAL DATA:  Hypertension.  EXAM: CHEST  2 VIEW  COMPARISON:  None.  FINDINGS: Mediastinum is normal. Mild hilar prominence is most likely secondary to tortuous pulmonary vessels. Borderline cardiomegaly, no pulmonary venous congestion. Left upper lobe infiltrate/ mass versus prominent left first costochondral calcification. Standard PA and lateral chest x-ray suggested for further evaluation. Apical lordotic chest x-ray may also prove useful. No pleural effusion or pneumothorax. No acute bony abnormality.  IMPRESSION:  Ill-defined infiltrate/mass left upper lobe versus left anterior first rib prominent costochondral calcification. A standard PA and lateral chest x-ray is suggested for further evaluation. Apical lordotic chest x-ray may also prove useful .   Electronically Signed   By: Marcello Moores  Register   On: 07/08/2013 14:00       EKG Interpretation   Date/Time:  Tuesday July 08 2013 12:46:15 EDT Ventricular Rate:  68 PR Interval:  166 QRS Duration: 132 QT Interval:  416 QTC Calculation: 442 R Axis:   22 Text Interpretation:  Sinus rhythm Multiform ventricular premature  complexes Non-specific intra-ventricular conduction delay Abnormal ekg No  significant change since last tracing except PVC's are new Confirmed by  [employer]  MD, MICHEAL ([id number]) on 07/08/2013 1:49:57 PM     RA sat is 97% and I interpret to be adequate   3:17 PM Ca is elevaated to 13, renal failure acute noted.  L:ikely dehydration.  Will continue IVF's, keep on monitor and discuss with [facility] clinic to admit for IVF's.    MDM   Final diagnoses:  Syncope  Hypercalcemia Renal failure   Pt with LOC and syncope while in a wheelchair.  Will check glucose, basic labs, ECG, troponin.  Pt is talkative, no obvious new focal deficits.  No trauma noted as he was found in his wheelchair.      Saddie Benders. Dorna Mai, MD 07/10/13 [phone]

## 2013-07-08 NOTE — ED Notes (Signed)
BS: 154

## 2013-07-08 NOTE — ED Notes (Signed)
Patient back from  X-ray 

## 2013-07-08 NOTE — ED Notes (Signed)
The patient had just finished PT and they were taking him back to his room and he slumped over the wheelchair. The staff at [facility] said he passed out for about a minute.  The staff placed him back in bed, took a BP=73/46.  EMS did advise that staff said he is on a lot of BP medications.  According to EMS, he was here and got a TURP and returned with left leg swelling and pain.  He was diagnosed with a DVT, treated and released.  All that happened in March 2015.  The patient had a previous stroke and has deficits to his right side. The patient's family was present and requested he be brought to the ED for evaluation.

## 2013-07-08 NOTE — ED Notes (Signed)
Attempted report 

## 2013-07-08 NOTE — ED Notes (Signed)
CRITICAL VALUE ALERT  Critical value received:  Calcium 13.1  Date of notification:  07/05/2013  Time of notification:  9532  Critical value read back:yes  Nurse who received alert:  MG Braulio Conte, RN  MD notified (1st page):  Dr. Dorna Mai  Time of first page:  1417  MD notified (2nd page):  Time of second page:  Responding MD:  Dr. Dorna Mai  Time MD responded:  [phone]

## 2013-07-08 NOTE — ED Notes (Signed)
Patient is back from X-ray.

## 2013-07-09 DIAGNOSIS — G9340 Encephalopathy, unspecified: Secondary | ICD-10-CM

## 2013-07-09 DIAGNOSIS — Z86718 Personal history of other venous thrombosis and embolism: Secondary | ICD-10-CM

## 2013-07-09 DIAGNOSIS — E875 Hyperkalemia: Secondary | ICD-10-CM | POA: Diagnosis present

## 2013-07-09 DIAGNOSIS — E210 Primary hyperparathyroidism: Secondary | ICD-10-CM

## 2013-07-09 DIAGNOSIS — B370 Candidal stomatitis: Secondary | ICD-10-CM

## 2013-07-09 DIAGNOSIS — N183 Chronic kidney disease, stage 3 unspecified: Secondary | ICD-10-CM

## 2013-07-09 DIAGNOSIS — I129 Hypertensive chronic kidney disease with stage 1 through stage 4 chronic kidney disease, or unspecified chronic kidney disease: Secondary | ICD-10-CM

## 2013-07-09 DIAGNOSIS — N179 Acute kidney failure, unspecified: Secondary | ICD-10-CM

## 2013-07-09 DIAGNOSIS — Z8673 Personal history of transient ischemic attack (TIA), and cerebral infarction without residual deficits: Secondary | ICD-10-CM

## 2013-07-09 DIAGNOSIS — I959 Hypotension, unspecified: Secondary | ICD-10-CM

## 2013-07-09 LAB — URINALYSIS, ROUTINE W REFLEX MICROSCOPIC
Bilirubin Urine: NEGATIVE
Glucose, UA: NEGATIVE mg/dL
Hgb urine dipstick: NEGATIVE
Ketones, ur: NEGATIVE mg/dL
Nitrite: NEGATIVE
Protein, ur: NEGATIVE mg/dL
Specific Gravity, Urine: 1.016 (ref 1.005–1.030)
Urobilinogen, UA: 0.2 mg/dL (ref 0.0–1.0)
pH: 6.5 (ref 5.0–8.0)

## 2013-07-09 LAB — URINE MICROSCOPIC-ADD ON

## 2013-07-09 LAB — BASIC METABOLIC PANEL
BUN: 34 mg/dL — ABNORMAL HIGH (ref 6–23)
BUN: 34 mg/dL — ABNORMAL HIGH (ref 6–23)
CO2: 21 mEq/L (ref 19–32)
CO2: 24 mEq/L (ref 19–32)
Calcium: 12.1 mg/dL — ABNORMAL HIGH (ref 8.4–10.5)
Calcium: 12.2 mg/dL — ABNORMAL HIGH (ref 8.4–10.5)
Chloride: 105 mEq/L (ref 96–112)
Chloride: 107 mEq/L (ref 96–112)
Creatinine, Ser: 2.47 mg/dL — ABNORMAL HIGH (ref 0.50–1.35)
Creatinine, Ser: 2.45 mg/dL — ABNORMAL HIGH (ref 0.50–1.35)
GFR calc Af Amer: 26 mL/min — ABNORMAL LOW (ref >90)
GFR calc Af Amer: 26 mL/min — ABNORMAL LOW (ref >90)
GFR calc non Af Amer: 22 mL/min — ABNORMAL LOW (ref >90)
GFR calc non Af Amer: 22 mL/min — ABNORMAL LOW (ref >90)
Glucose, Bld: 114 mg/dL — ABNORMAL HIGH (ref 70–99)
Glucose, Bld: 100 mg/dL — ABNORMAL HIGH (ref 70–99)
Potassium: 5.7 mEq/L — ABNORMAL HIGH (ref 3.7–5.3)
Potassium: 5.5 mEq/L — ABNORMAL HIGH (ref 3.7–5.3)
Sodium: 138 mEq/L (ref 137–147)
Sodium: 140 mEq/L (ref 137–147)

## 2013-07-09 LAB — CBC
HCT: 27.1 % — ABNORMAL LOW (ref 39.0–52.0)
Hemoglobin: 8.6 g/dL — ABNORMAL LOW (ref 13.0–17.0)
MCH: 29.7 pg (ref 26.0–34.0)
MCHC: 31.7 g/dL (ref 30.0–36.0)
MCV: 93.4 fL (ref 78.0–100.0)
Platelets: 165 10*3/uL (ref 150–400)
RBC: 2.9 MIL/uL — ABNORMAL LOW (ref 4.22–5.81)
RDW: 15.5 % (ref 11.5–15.5)
WBC: 8 10*3/uL (ref 4.0–10.5)

## 2013-07-09 LAB — CREATININE, URINE, RANDOM: Creatinine, Urine: 140.53 mg/dL

## 2013-07-09 LAB — TROPONIN I: Troponin I: <0.3 ng/mL (ref <0.30)

## 2013-07-09 LAB — SODIUM, URINE, RANDOM: Sodium, Ur: 39 mEq/L

## 2013-07-09 MED ORDER — ENOXAPARIN SODIUM 80 MG/0.8ML ~~LOC~~ SOLN
75.0000 mg | SUBCUTANEOUS | Status: DC
  Administered 2013-07-09 – 2013-07-10 (×2): 75 mg via SUBCUTANEOUS
  Filled 2013-07-09 (×2): qty 0.8

## 2013-07-09 NOTE — Discharge Summary (Signed)
Name: [name] MRN: [id number] DOB: June 28, 1924 80 y.o. PCP: Joni Reining, DO  Date of Admission: 07/08/2013 12:34 PM Date of Discharge: 07/10/2013 Attending Physician: Bartholomew Crews, MD  Discharge Diagnosis: Principal Problem:   Acute encephalopathy- 2/2 hypotension, found to be orthostatic and likely volume depleted 2/2 poor PO intake Active Problems:   Hypercalcemia   Acute on chronic kidney failure- likely prerenal    Hyperkalemia   Orthostatic hypotension- 2/2 poor PO intake  Discharge Medications:   Medication List    STOP taking these medications       benazepril 20 MG tablet  Commonly known as:  LOTENSIN     metoprolol tartrate 25 MG tablet  Commonly known as:  LOPRESSOR      TAKE these medications       atorvastatin 20 MG tablet  Commonly known as:  LIPITOR  Take 20 mg by mouth daily at 6 PM.     bisacodyl 10 MG suppository  Commonly known as:  DULCOLAX  Place 10 mg rectally daily as needed for moderate constipation.     clopidogrel 75 MG tablet  Commonly known as:  PLAVIX  Take 75 mg by mouth daily with breakfast.     enoxaparin 80 MG/0.8ML injection  Commonly known as:  LOVENOX  Inject 0.75 mLs (75 mg total) into the skin daily.     GERITOL PO  Take 1 tablet by mouth daily.     magnesium hydroxide 800 MG/5ML suspension  Commonly known as:  MILK OF MAGNESIA  Take 30 mLs by mouth daily as needed for constipation.     omega-3 acid ethyl esters 1 G capsule  Commonly known as:  LOVAZA  Take 1 g by mouth daily.     RA SALINE ENEMA 19-7 GM/118ML Enem  Place 1 each rectally daily as needed (for constipation not relieved by Bisacodyl).     traMADol-acetaminophen 37.5-325 MG per tablet  Commonly known as:  ULTRACET  Take 1 tablet by mouth every 6 (six) hours as needed for moderate pain.     VITAMIN D PO  Take 1 tablet by mouth daily.       Disposition and follow-up:   Mr.Conroy D Spiers was discharged from [facility] in Stable condition.  At the hospital follow up visit please address:  1.  Orthostatic hypotension- patient presented with overt hypotension and remained orthostatic on discharge; please assess for symptoms of orthostasis; thought to be 2/2 poor PO intake, please assess 2.  HTN- Pt's antihypertensives held on discharge, please assess if these medications need to be added back (lotensin and lopressor) 3. Primary hyperparathyroidism- pt hypercalcemic to 13.1 on admission which downtrended, please check Ca level 4. LLE DVT- patient's anticoagulation restarted this admission (see below for further details, but briefly pt had recent DVT and w/ severe hematuria w/ anticoagulation so IVC filter placed); please assess for bleeding w/ CBC and UA, if tolerating anticoagulation may need to consult IR for IVC filter removal   2.  Labs / imaging needed at time of follow-up: CBC, BMP  3.  Pending labs/ test needing follow-up: UCx  Follow-up Appointments: Follow-up Information   Follow up with Fransisca Kaufmann, MD On 07/18/2013. (9:15am )    Specialty:  Internal Medicine   Contact information:   [place] [phone]       Discharge Instructions: Discharge Orders   Future Appointments Provider Department Dept Phone   07/18/2013 9:15 AM Neema  Bobbie Stack, MD [place] [phone]   Future Orders Complete By Expires   Call MD for:  extreme fatigue  As directed    Call MD for:  persistant dizziness or light-headedness  As directed    Call MD for:  temperature >100.4  As directed    Diet - low sodium heart healthy  As directed    Increase activity slowly  As directed       Consultations:    Procedures Performed:  Dg Chest 1 View  07/09/2013   CLINICAL DATA:  Ill-defined infiltrate or mass at the left upper lobe. PA chest radiograph suggested for further evaluation.  EXAM: CHEST - 1 VIEW  COMPARISON:  Chest radiograph performed earlier today at  1:47 p.m.  FINDINGS: Left upper lung zone density has resolved, and appears to have reflected the left first rib. No mass is seen. No pleural effusion or pneumothorax identified.  The cardiomediastinal silhouette remains normal in size. No acute osseous abnormalities are seen.  IMPRESSION: Left upper lung zone density has resolved; it appears to have reflected the left first rib. No mass seen. No acute cardiopulmonary process identified.   Electronically Signed   By: Garald Balding M.D.   On: 07/09/2013 04:09   Dg Chest 2 View  07/08/2013   CLINICAL DATA:  Hypertension.  EXAM: CHEST  2 VIEW  COMPARISON:  None.  FINDINGS: Mediastinum is normal. Mild hilar prominence is most likely secondary to tortuous pulmonary vessels. Borderline cardiomegaly, no pulmonary venous congestion. Left upper lobe infiltrate/ mass versus prominent left first costochondral calcification. Standard PA and lateral chest x-ray suggested for further evaluation. Apical lordotic chest x-ray may also prove useful. No pleural effusion or pneumothorax. No acute bony abnormality.  IMPRESSION: Ill-defined infiltrate/mass left upper lobe versus left anterior first rib prominent costochondral calcification. A standard PA and lateral chest x-ray is suggested for further evaluation. Apical lordotic chest x-ray may also prove useful .   Electronically Signed   By: [place]   On: 07/08/2013 14:00   Ct Head Wo Contrast  07/09/2013   CLINICAL DATA:  Stroke, altered mental status.  EXAM: CT HEAD WITHOUT CONTRAST  TECHNIQUE: Contiguous axial images were obtained from the base of the skull through the vertex without intravenous contrast.  COMPARISON:  CT HEAD W/O CM dated 04/17/2013  FINDINGS: No intraparenchymal hemorrhage, mass effect or midline shift. Confluent supratentorial white matter hypodensities are similar. Right medial occipital lobe and right cerebellar encephalomalacia again seen. Multiple small left cerebellar remote infarcts. Punctate  hyperdensities within the bilateral basal ganglia and thalamus likely reflect remote lacunar infarcts. No hydrocephalus.  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 No abnormal extra-axial fluid collections. No skull fracture. Remote left medial orbital wall fracture. Trace paranasal sinus mucosal thickening without air-fluid levels. Mastoid air cells are well aerated.  IMPRESSION: No acute intracranial process.  Moderate to severe atrophy. Severe white matter changes suggest chronic small vessel ischemic disease with remote right occipital and cerebellar infarcts, in addition to scattered lacunar infarcts of the basal ganglia/ thalamus and remote left cerebellar infarcts.  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   Electronically Signed   By: Elon Alas   On: 07/09/2013 00:05   Ir Ivc Filter Plmt / S&i /img Guid/mod Sed  06/16/2013   CLINICAL DATA:  Left lower extremity DVT and gross hematuria after anticoagulation. Recent TURP. History of stroke and left sphenoid meningioma.  EXAM: 1. ULTRASOUND GUIDANCE FOR  VASCULAR ACCESS OF THE RIGHT INTERNAL JUGULAR VEIN. 2. IVC VENOGRAM. 3. PERCUTANEOUS IVC FILTER PLACEMENT.  ANESTHESIA/SEDATION: 1.5 mg IV Versed; 50 mcg IV Fentanyl.  Total Moderate Sedation Time  76minutes.  CONTRAST:  36mL OMNIPAQUE IOHEXOL 300 MG/ML  SOLN  FLUOROSCOPY TIME:  35 seconds.  PROCEDURE: The procedure, risks, benefits, and alternatives were explained to the patient. Questions regarding the procedure were encouraged and answered. The patient understands and consents to the procedure.  The right neck was prepped with chlorhexidine in a sterile fashion, and a sterile drape was applied covering the operative field. A sterile gown and sterile gloves were used for the procedure. Local anesthesia was provided with 1% Lidocaine.  Ultrasound was used to confirm patency of the right internal jugular vein. Under direct ultrasound guidance, a 21 gauge needle was advanced into the  right internal jugular vein with ultrasound image documentation performed. After securing access with a micropuncture dilator, a guidewire was advanced into the inferior vena cava. A deployment sheath was advanced over the guidewire. This was utilized to perform IVC venography.  The deployment sheath was further positioned in an appropriate location for filter deployment. A Bard Denali IVC filter was then advanced in the sheath. This was then fully deployed in the infrarenal IVC. Final filter position was confirmed with a fluoroscopic spot image. Contrast injection was also performed through the sheath under fluoroscopy to confirm patency of the IVC at the level of the filter. After the procedure the sheath was removed and hemostasis obtained with manual compression.  COMPLICATIONS: None.  FINDINGS: IVC venography demonstrates a normal caliber IVC with no evidence of thrombus. Renal veins are identified bilaterally. The IVC filter was successfully positioned below the level of the renal veins and is appropriately oriented. This IVC filter has both permanent and retrievable indications.  IMPRESSION: Placement of percutaneous IVC filter in infrarenal IVC. IVC venogram shows no evidence of IVC thrombus and normal caliber of the inferior vena cava. This filter does have both permanent and retrievable indications.   Electronically Signed   By: Aletta Edouard M.D.   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  Patient worked with OT this morning and returned to his room at around 11:30am. His daughter came to see him and found him slumped over, less responsive than usual in his wheelchair at around 0:35KK. Per EMS, systolic BP in  93G, which increased to 80s by the time he was in ED. Patient's blood pressure was 96/58 s/p 1L NS when we saw him in the ED. Patient's mental status much improved on our exam. He was alert and oriented x 3. He endorses having generalized weakness and some lightheadedness, though this is not new and he states this is how he feels "all the time." Denies CP, abd pain, SOB, HA, vision changes, focal neurologic symptoms. According to the patient he has not had any recent falls. He has been eating and drinking normally. However, the note in the ED says patient had skipped breakfast and lunch (per daughter pt typically does skip breakfast).   Hospital Course by problem list: # Hypotension with acute encephalopathy: Patient presented with AMS and blood pressures in 18E systolic which improved rapidly with IVF resuscitation.  Mental status about at baseline (A&O x 2-3) and neurological exam was at baseline (dysarthria w/ some cerebellar deficits) when we evaluated patient and remained stable during his entire hospital stay. Pt afebrile with rest of VSS though he was orthostatic per HR. Head CT showed some chronic small vessel disease and prior R occipital and cerebellar stroke, no acute intracranial process. Troponin x 3 negative, so ACS essentially ruled out. UA shows few bacteria with 20-50 WBC/hpf--no need for treatment as no clear infection or symptoms of UTI. Sent UCx, which is still pending at discharge. CXR- no acute process. No concern for infection as afebrile, no leukocytosis, no SIRS criteria whatsoever with no nidus of infection identified. Reportedly pt skipping meals at SNF and decreased fluid intake since he was sent there a week ago. Patient's hypotension thought to be due to hypovolemia 2/2 poor PO intake (pt also w/ prerenal AKI). He received a total of approx 5L NS during his hospital stay with blood pressures low 100s-120s/70s on day of discharge. Pt remained orthostatic by DBP on day of discharge, but  was not symptomatic. I spoke with patient's daughter prior to patient's discharge and she was in agreement with plan to discharge back to [facility]. She plans to have meeting with the SNF staff to make sure patient is receiving adequate hydration. He has follow up with [facility] scheduled.   # Recent LLE DVT: Patient hospitalized 4/2-4/10 for LLE DVT. Hospital stay complicated by hematuria (pt s/p TURP for BPH in 05/2013) after anticoagulation initiated. Pt's anticoagulation was stopped and IVC filter placed. Plan was to restart anticoagulation if pt's hematuria resolved for at least 1 week so since his UA this admission negative for hematuria, we restarted lovenox on HD 1. Pt tolerating well so far, but will need close follow up and monitoring for hematuria or other bleeding. Will need CBC and likely UA at follow up appointment to assess. Plan is to remove IVC filter if pt tolerates anticoagulation. Will need to be referred back to IR (IR placed IVC filter) for removal if this is the case. Of note, pt with stable Hb (hovers around 8), likely has not recovered since his acute bleeding during his last hospitalization a week ago (baseline appears to be about 12).    # HTN: Pt on metoprolol 25mg  BID and Lotensin 20mg  daily at home. Given his hypotension and orthostasis at discharge we held home antihypertensives. Will need this addressed at hospital follow up appointment.   # Acute on Chronic CKD stage 2: Down trending. Patient presented with Cr 2.58 on admission-->2.17 on day of discharge (with baseline Cr ~1) after IVF. Suspect prerenal ( FENa <.5%) with possible ATN as Cr slow to down trend with IVF. Will need BMP at follow up appointment.  # Hx of CVA: Pt with residual ataxia (walks with walker) and dysarthria from prior cerebellar stroke (in 2013). Stable. No new neurologic symptoms or physical exam findings. No concern for acute stroke during his hospital stay. Continued home plavix and lipitor.  # CXR  abnormality: Completely resolved. CXR on admission shows ill defined infiltrate vs mass in left upper lobe vs L anterior first rib prominent costochondral calcification. Repeat CXR showed complete resolution of this abnormality. No need for follow up.   # Primary hyperparathyroidism: Patient with calcium of 13.1 on admission, up from 10.1 on 4/8. Of note, hx of hypercalcemia with inappropriately normal PTH level. UPEP and SPEP done during his last hospitalization, both negative for bence jones proteins. Vit  D levels and PTH-related peptide normal 06/2013. Ca level has been as high as 11.4, never as high as it was on admission. Likely exacerbated by some hemoconcentration as Ca level downtrended to 11.1 on day of discharge after IVF. Will need repeat BMP at f/u visit.  Discharge Vitals:   BP 120/78  Pulse 86  Temp(Src) 97.9 F (36.6 C) (Oral)  Resp 18  Ht 5' 10.87" (1.8 m)  Wt 164 lb 14.5 oz (74.8 kg)  BMI 23.09 kg/m2  SpO2 97%  Discharge Labs:  Results for orders placed during the hospital encounter of 07/08/13 (from the past 24 hour(s))  BASIC METABOLIC PANEL     Status: Abnormal   Collection Time    07/10/13  5:38 AM      Result Value Ref Range   Sodium 136 (*) 137 - 147 mEq/L   Potassium 5.2  3.7 - 5.3 mEq/L   Chloride 107  96 - 112 mEq/L   CO2 19  19 - 32 mEq/L   Glucose, Bld 81  70 - 99 mg/dL   BUN 34 (*) 6 - 23 mg/dL   Creatinine, Ser 2.17 (*) 0.50 - 1.35 mg/dL   Calcium 11.1 (*) 8.4 - 10.5 mg/dL   GFR calc non Af Amer 26 (*) >90 mL/min   GFR calc Af Amer 30 (*) >90 mL/min  CBC     Status: Abnormal   Collection Time    07/10/13  5:38 AM      Result Value Ref Range   WBC 6.2  4.0 - 10.5 K/uL   RBC 2.70 (*) 4.22 - 5.81 MIL/uL   Hemoglobin 8.0 (*) 13.0 - 17.0 g/dL   HCT 25.0 (*) 39.0 - 52.0 %   MCV 92.6  78.0 - 100.0 fL   MCH 29.6  26.0 - 34.0 pg   MCHC 32.0  30.0 - 36.0 g/dL   RDW 15.6 (*) 11.5 - 15.5 %   Platelets 151  150 - 400 K/uL    Signed: Rebecca Eaton,  MD 07/10/2013, 3:03 PM   Time Spent on Discharge: 35 minutes Services Ordered on Discharge: discharged to SNF [facility]) Equipment Ordered on Discharge: none

## 2013-07-09 NOTE — H&P (Signed)
  Date: 07/09/2013  Patient name: [name]  Medical record number: [id number]  Date of birth: [birth year]   I have seen and evaluated [name] and discussed their care with the Residency Team. [name] is hard of hearing so communication challenging and info obtained from chart. He is living in SNF. Had nor eaten breakfast or lunch. Had worked with PT / OT in that AM. Daughter found him slumped over in [facility]. Was returned to room and eventually "came to" and was alert. SBP was 70 then 80 and in ED 98. He has generalized weakness but that is old. He denied pain. He walks with a walker. He is on an ACEI at the SNF.  Today, he is no NAD. Sleeping but awakens. Moves all 4 and follows simple commands (communications limits other interactions). Strength is equal and about 4/5. Severe thenar wasting L LE edema L>R.   W/U sig for K 5.6, Cr 2.58, Ca 13.3, WBC 10.3, orthostatics - SBP 107 - 90 sitting to standing.   Assessment and Plan: I have seen and evaluated the patient as outlined above. I agree with the formulated Assessment and Plan as detailed in the residents' admission note, with the following changes:   1. Acute encephalopathy - presumed 2/2 hypotension which is presumed to be 2/2 decreased PO. The encephalopathy has resolved and he is back to baseline. UA does show pyuria but he resolved without ABX. No AMI. He is hypercalcemic but is at baseline. CXR shows no infiltrate and CT head shows no new process.   2. Hypotension - hold anti-HTN and hydrate. Follow BP. Thought 2/2 decreased PO but per floor RN, pt is able to eat and drink with minimal assistance so as long as he has food and drink in front of him, he should be able to stay nourished.   3. Acute kidney injury - if he did have decreased PO and cont to take ACI, that could explain the acute kidney injury. Hydrate. Hold ACEI. Follow.   4. Recent LLE DVT - UA showed no hematuria so restart Lovenox with goal of removing IVC  filter.  Bartholomew Crews, MD 4/29/20152:15 PM

## 2013-07-09 NOTE — Progress Notes (Signed)
CSW (Clinical Education officer, museum) made aware pt admitted from [place]. CSW unable to complete assessment with pt because of confusion. Have called daughter and left voicemail awaiting return call. Per [facility], pt was only recently admitted to their facility for ST rehab and can return at dc.  [place], [place]

## 2013-07-09 NOTE — Evaluation (Signed)
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  Clinical Impression  Pt adm due to the above. Presents with decreased independence with functional mobility secondary to deficits indicated below. Pt to benefit from skilled acute PT to maximize functional mobility. Pt will require 2 person (A) at this time for OOB mobility. Difficulty obtaining history due to no family present. Pt from [facility] and will need SNF upon acute D/C.     Follow Up Recommendations SNF;Supervision/Assistance - 24 hour    Equipment Recommendations  None recommended by PT    Recommendations for Other Services       Precautions / Restrictions Precautions Precautions: Fall Restrictions Weight Bearing Restrictions: No      Mobility  Bed Mobility Overal bed mobility: Needs Assistance Bed Mobility: Supine to Sit;Sit to Supine;Rolling Rolling: Min assist   Supine to sit: Mod assist;HOB elevated Sit to supine: Mod assist   General bed mobility comments: pt able to (A) with transfer with UEs when given tactile cues; requires (A) to achieve upright position at EOb and then return to supine; pt with difficulty controlling trunk   Transfers                 General transfer comment: will require 2 person (A) for safety at this time   Ambulation/Gait                Stairs            Wheelchair Mobility    Modified Rankin (Stroke Patients Only)       Balance Overall balance assessment: Needs assistance;History of Falls Sitting-balance support: Feet supported;Single extremity supported Sitting  balance-Leahy Scale: Poor Sitting balance - Comments: pt leaning posteriorly more with exercises EOB; relies heavily on UE support; tolerated sitting EOB ~9 min  Postural control: Posterior lean                                   Pertinent Vitals/Pain No c/o pain     Home Living Family/patient expects to be discharged to:: Skilled nursing facility                 Additional Comments: pt from [facility] per chart     Prior Function Level of Independence: Needs assistance         Comments: no family present; per pt he states he does not walk; (A) for all ADLs and mobility would be required at this time      Hand Dominance   Dominant Hand: Right    Extremity/Trunk Assessment   Upper Extremity Assessment: Defer to OT evaluation           Lower Extremity Assessment: Generalized weakness      Cervical / Trunk Assessment: Kyphotic  Communication   Communication: HOH  Cognition Arousal/Alertness: Awake/alert Behavior During Therapy: Flat affect Overall Cognitive Status: Difficult to assess Area of Impairment: Following commands;Problem solving       Following Commands: Follows one step commands with increased time     Problem Solving: Decreased initiation;Difficulty sequencing;Requires verbal cues;Requires tactile  cues General Comments: pt answers questions inconsistently; perseverating on different verbalizations; speak with short fragmented sentences ~50% of time; no family present     General Comments      Exercises General Exercises - Lower Extremity Ankle Circles/Pumps: AAROM;Both;10 reps;Supine Long Arc Quad: AAROM;Both;10 reps;Seated Heel Slides: 5 reps;Strengthening;AAROM;Supine      Assessment/Plan    PT Assessment Patient needs continued PT services  PT Diagnosis Difficulty walking;Altered mental status   PT Problem List Decreased strength;Decreased range of motion;Decreased activity tolerance;Decreased balance;Decreased  mobility;Decreased knowledge of use of DME;Pain  PT Treatment Interventions DME instruction;Gait training;Functional mobility training;Therapeutic activities;Therapeutic exercise;Balance training;Patient/family education   PT Goals (Current goals can be found in the Care Plan section) Acute Rehab PT Goals PT Goal Formulation: Patient unable to participate in goal setting Time For Goal Achievement: 07/16/13 Potential to Achieve Goals: Fair    Frequency Min 2X/week   Barriers to discharge Decreased caregiver support      Co-evaluation               End of Session   Activity Tolerance: Patient tolerated treatment well Patient left: in bed;with call bell/phone within reach;with bed alarm set;with nursing/sitter in room Nurse Communication: Mobility status    Functional Assessment Tool Used: clinical observation Functional Limitation: Mobility: Walking and moving around Mobility: Walking and Moving Around Current Status ([id number]): At least 80 percent but less than 100 percent impaired, limited or restricted Mobility: Walking and Moving Around Goal Status [phone]): At least 60 percent but less than 80 percent impaired, limited or restricted    Time: 8588-5027 PT Time Calculation (min): 15 min   Charges:   PT Evaluation $Initial PT Evaluation Tier I: 1 Procedure PT Treatments $Therapeutic Activity: 8-22 mins   PT G Codes:   Functional Assessment Tool Used: clinical observation Functional Limitation: Mobility: Walking and moving around    [place], [place] [phone] 07/09/2013, 5:35 PM

## 2013-07-09 NOTE — Progress Notes (Signed)
ANTICOAGULATION CONSULT NOTE - Initial Consult  Pharmacy Consult for lovenox Indication: DVT  No Known Allergies  Patient Measurements: Height: 5' 10.87" (180 cm) Weight: 164 lb 14.5 oz (74.8 kg) IBW/kg (Calculated) : 74.99  Vital Signs: Temp: 97.8 F (36.6 C) (04/29 0532) Temp src: Oral (04/29 0532) BP: 105/64 mmHg (04/29 0534) Pulse Rate: 77 (04/29 0532)  Labs:  Recent Labs  07/08/13 1309 07/08/13 1900 07/09/13 0048 07/09/13 0730  HGB 9.4*  --  8.6*  --   HCT 29.8*  --  27.1*  --   PLT 224  --  165  --   CREATININE 2.58*  --  2.47* 2.45*  TROPONINI <0.30 <0.30 <0.30  --     Estimated Creatinine Clearance: 22.9 ml/min (by C-G formula based on Cr of 2.45).   Medical History: Past Medical History  Diagnosis Date  . Hypertension   . Difficulty hearing, right     BILATERAL HEARING LOSS - BEST TO TRY TO SPEAK INTO LEFT EAR  . Meningioma   . Frequent falls   . BPH (benign prostatic hypertrophy)     TURP 05/19/13  . Chronic kidney disease     CHRONIC KIDNEY DISEASE, 2  . Anemia   . Thrombocytopenia   . Stroke     Cerebellar, 2013; WALKS WITH WALKER, ABLE TO DRESS AND BATHE HIMSELF BUT FAMILY TRIES TO PROVIDE SUPERVISION BECAUSE OF HIS HX OF FALL AND WEAKNESS LEGS, ARMS   . Incontinence of urine     SOME INCONTINENCE  . Hyperlipidemia     Medications:  Prescriptions prior to admission  Medication Sig Dispense Refill  . atorvastatin (LIPITOR) 20 MG tablet Take 20 mg by mouth daily at 6 PM.      . benazepril (LOTENSIN) 20 MG tablet Take 20 mg by mouth every morning.      . bisacodyl (DULCOLAX) 10 MG suppository Place 10 mg rectally daily as needed for moderate constipation.      . Cholecalciferol (VITAMIN D PO) Take 1 tablet by mouth daily.      . clopidogrel (PLAVIX) 75 MG tablet Take 75 mg by mouth daily with breakfast.      . Iron-Vitamins (GERITOL PO) Take 1 tablet by mouth daily.      . magnesium hydroxide (MILK OF MAGNESIA) 800 MG/5ML suspension Take 30  mLs by mouth daily as needed for constipation.      . metoprolol tartrate (LOPRESSOR) 25 MG tablet Take 25 mg by mouth 2 (two) times daily.      Marland Kitchen omega-3 acid ethyl esters (LOVAZA) 1 G capsule Take 1 g by mouth daily.      . Sodium Phosphates (RA SALINE ENEMA) 19-7 GM/118ML ENEM Place 1 each rectally daily as needed (for constipation not relieved by Bisacodyl).      . traMADol-acetaminophen (ULTRACET) 37.5-325 MG per tablet Take 1 tablet by mouth every 6 (six) hours as needed for moderate pain.        Assessment: 44 yom recently diagnosed (06/10/13) with a new DVT. Anticoagulation was initiated at that time, however, pt developed hematuria at that time so it was discontinued. An IVC filter was placed. Initial plan was to restart anticoagulation a week after hematuria resolved. Will restart today with full dose lovenox. H/H is low but FOB is negative. Also Scr is elevated so CrCl is poor.   Goal of Therapy:  Anti-Xa level 0.6-1 units/ml 4hrs after LMWH dose given Monitor platelets by anticoagulation protocol: Yes   Plan:  1.  Lovenox 75mg  SQ Q24H 2. CBC Q72H while on lovenox 3. Monitor for signs & symptoms of bleeding and renal fxn 4. F/u plans for oral anticoagulation  [name] 07/09/2013,1:49 PM

## 2013-07-09 NOTE — Progress Notes (Signed)
UR Completed.  [name] [name] [phone] 07/09/2013  

## 2013-07-09 NOTE — Progress Notes (Signed)
Subjective: Patient easily arousable this morning and denies having any pain. No abd pain, CP, HA, N/V/D, SOB. No family in the room.   Objective: Vital signs in last 24 hours: Filed Vitals:   07/08/13 1800 07/08/13 2113 07/09/13 0532 07/09/13 0534  BP: 109/62 117/67 103/57 105/64  Pulse: 68 66 77   Temp: 97.4 F (36.3 C) 98.7 F (37.1 C) 97.8 F (36.6 C)   TempSrc: Oral Oral Oral   Resp: 18 18 18    Weight:   164 lb 14.5 oz (74.8 kg)   SpO2: 100% 100% 96%    Weight change:   Intake/Output Summary (Last 24 hours) at 07/09/13 0729 Last data filed at 07/09/13 0536  Gross per 24 hour  Intake 1352.08 ml  Output    250 ml  Net 1102.08 ml   Physical exam: General: thin, elderly man; alert, cooperative, and in no apparent distress; resting quietly in bed  HEENT: vision grossly intact, tongue and posterior oropharynx with while film (improved from yesterday) Neck: supple Lungs: CTAB Heart: regular rate and rhythm, no murmurs, gallops, or rubs Abdomen: soft, thin, non-tender, non-distended, normal bowel sounds  Extremities: warm, xerosis diffusely, trace to 1+ pitting edema to BLE Neurologic: alert & oriented X3, cranial nerves II-XII intact, dysarthric, no focal neurologic change, moving all extremities spontaneously with some grossly apparent coordination deficits  Lab Results: Basic Metabolic Panel:  Recent Labs Lab 07/08/13 1309 07/09/13 0048  NA 138 138  K 5.6* 5.7*  CL 100 105  CO2 25 21  GLUCOSE 158* 114*  BUN 31* 34*  CREATININE 2.58* 2.47*  CALCIUM 13.1* 12.1*   CBC:  Recent Labs Lab 07/08/13 1309 07/09/13 0048  WBC 10.3 8.0  NEUTROABS 8.4*  --   HGB 9.4* 8.6*  HCT 29.8* 27.1*  MCV 93.4 93.4  PLT 224 165   Cardiac Enzymes:  Recent Labs Lab 07/08/13 1309 07/08/13 1900 07/09/13 0048  TROPONINI <0.30 <0.30 <0.30   CBG:  Recent Labs Lab 07/08/13 1331  GLUCAP 154*   Micro Results: No results found for this or any previous visit (from  the past 240 hour(s)). Studies/Results: Dg Chest 1 View  07/09/2013   CLINICAL DATA:  Ill-defined infiltrate or mass at the left upper lobe. PA chest radiograph suggested for further evaluation.  EXAM: CHEST - 1 VIEW  COMPARISON:  Chest radiograph performed earlier today at 1:47 p.m.  FINDINGS: Left upper lung zone density has resolved, and appears to have reflected the left first rib. No mass is seen. No pleural effusion or pneumothorax identified.  The cardiomediastinal silhouette remains normal in size. No acute osseous abnormalities are seen.  IMPRESSION: Left upper lung zone density has resolved; it appears to have reflected the left first rib. No mass seen. No acute cardiopulmonary process identified.   Electronically Signed   By: Garald Balding M.D.   On: 07/09/2013 04:09   Dg Chest 2 View  07/08/2013   CLINICAL DATA:  Hypertension.  EXAM: CHEST  2 VIEW  COMPARISON:  None.  FINDINGS: Mediastinum is normal. Mild hilar prominence is most likely secondary to tortuous pulmonary vessels. Borderline cardiomegaly, no pulmonary venous congestion. Left upper lobe infiltrate/ mass versus prominent left first costochondral calcification. Standard PA and lateral chest x-ray suggested for further evaluation. Apical lordotic chest x-ray may also prove useful. No pleural effusion or pneumothorax. No acute bony abnormality.  IMPRESSION: Ill-defined infiltrate/mass left upper lobe versus left anterior first rib prominent costochondral calcification. A standard PA and lateral chest  x-ray is suggested for further evaluation. Apical lordotic chest x-ray may also prove useful .   Electronically Signed   By: Baldwyn   On: 07/08/2013 14:00   Ct Head Wo Contrast  07/09/2013   CLINICAL DATA:  Stroke, altered mental status.  EXAM: CT HEAD WITHOUT CONTRAST  TECHNIQUE: Contiguous axial images were obtained from the base of the skull through the vertex without intravenous contrast.  COMPARISON:  CT HEAD W/O CM dated  04/17/2013  FINDINGS: No intraparenchymal hemorrhage, mass effect or midline shift. Confluent supratentorial white matter hypodensities are similar. Right medial occipital lobe and right cerebellar encephalomalacia again seen. Multiple small left cerebellar remote infarcts. Punctate hyperdensities within the bilateral basal ganglia and thalamus likely reflect remote lacunar infarcts. No hydrocephalus.  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 No abnormal extra-axial fluid collections. No skull fracture. Remote left medial orbital wall fracture. Trace paranasal sinus mucosal thickening without air-fluid levels. Mastoid air cells are well aerated.  IMPRESSION: No acute intracranial process.  Moderate to severe atrophy. Severe white matter changes suggest chronic small vessel ischemic disease with remote right occipital and cerebellar infarcts, in addition to scattered lacunar infarcts of the basal ganglia/ thalamus and remote left cerebellar infarcts.  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   Electronically Signed   By: Elon Alas   On: 07/09/2013 00:05   Medications: I have reviewed the patient's current medications. Scheduled Meds: . atorvastatin  20 mg Oral q1800  . clopidogrel  75 mg Oral Q breakfast  . heparin  5,000 Units Subcutaneous 3 times per day  . nystatin  5 mL Oral QID  . omega-3 acid ethyl esters  1 g Oral Daily  . sodium chloride  3 mL Intravenous Q12H   Continuous Infusions: . sodium chloride 125 mL/hr at 07/08/13 2330   PRN Meds:.  Assessment/Plan:  # Hypotension with acute encephalopathy: Patient A&O x 2. Unsure of baseline mental status. Per daughter, patient's cognition has slowly declining recently. Pt answers questions appropriately. Head CT done overnight showed some chronic small vessel disease and prior R occipital and cerebellar stroke, no acute intracranial process. Blood pressure responded well to IVF overnight (s/p 1LNS and NS at 125cc/hr  overnight), though pt with some BLE edema so will decrease fluid rate now. Pt orthostatic by HR yesterday in the ED. Likely 2/2 poor PO intake. Patient with 3 negative troponins, so ACS essentially ruled out. UA shows few bacteria with 20-50 WBC/hpf--will not treat for now as this is not clearly infection. Will add UCx. -PT/OT -add on UCx -recheck orthostatics -CSW for placement (came from [facility])  -IVF NS at 75cc/hr  # Recent LLE DVT s/p IVC filter placement: Stable. No hematuria on UA. Will restart anticoagulation with therapeutic lovenox.  # HTN: Blood pressure low-normal overnight. Pt on metoprolol 25mg  BID and Lotensin 20mg  daily. Holding home antihypertensives given hypotension.   # Acute on Chronic CKD stage 2: Slightly improving. Cr Cr 2.58 on admission, with baseline Cr ~1. Cr today 2.45. Most likely prerenal origin, possibly long standing with superimposed ATN. Giving IVF, though decreasing rate today. -IVF at 75cc/hr -FENa not collected yet -consider renal U/S if no improvement after IVF   # Oral thrush: Pt wears dentures. With white film on tongue/oropharynx on admission.  -nystatin swish/swallow   # Hx of CVA: Stable. Pt with residual ataxia (walks with walker) and dysarthria from prior cerebellar stroke (in 2013). Stable. No new neurologic symptoms or physical exam  findings. Head CT- no acute process. No concern for acute stroke at this time.  -continue home plavix and lipitor   # Primary hyperparathyroidism: Improving. Ca today downtrended from 13.1 on admission to 12.2 today. Of note, hx of hypercalcemia with inappropriately normal PTH level.  -IVF as above  -BMP in AM   # CXR abnormality: Resolved. Repeat 2 view CXR shows complete resolution of this.   # VTE: lovenox (therapeutic) # Diet: heart healthy  Code status: full  Dispo: Disposition is deferred at this time, awaiting improvement of current medical problems.  Anticipated discharge in approximately 1 day(s).     The patient does have a current PCP Joni Reining, DO) and does need an [facility] hospital follow-up appointment after discharge.  The patient does not have transportation limitations that hinder transportation to clinic appointments.  .Services Needed at time of discharge: Y = Yes, Blank = No PT:   OT:   RN:   Equipment:   Other:     LOS: 1 day   Rebecca Eaton, MD 07/09/2013, 7:29 AM

## 2013-07-10 DIAGNOSIS — I951 Orthostatic hypotension: Secondary | ICD-10-CM

## 2013-07-10 LAB — URINE CULTURE
Colony Count: NO GROWTH
Culture: NO GROWTH

## 2013-07-10 LAB — BASIC METABOLIC PANEL
BUN: 34 mg/dL — ABNORMAL HIGH (ref 6–23)
CO2: 19 mEq/L (ref 19–32)
Calcium: 11.1 mg/dL — ABNORMAL HIGH (ref 8.4–10.5)
Chloride: 107 mEq/L (ref 96–112)
Creatinine, Ser: 2.17 mg/dL — ABNORMAL HIGH (ref 0.50–1.35)
GFR calc Af Amer: 30 mL/min — ABNORMAL LOW (ref >90)
GFR calc non Af Amer: 26 mL/min — ABNORMAL LOW (ref >90)
Glucose, Bld: 81 mg/dL (ref 70–99)
Potassium: 5.2 mEq/L (ref 3.7–5.3)
Sodium: 136 mEq/L — ABNORMAL LOW (ref 137–147)

## 2013-07-10 LAB — CBC
HCT: 25 % — ABNORMAL LOW (ref 39.0–52.0)
Hemoglobin: 8 g/dL — ABNORMAL LOW (ref 13.0–17.0)
MCH: 29.6 pg (ref 26.0–34.0)
MCHC: 32 g/dL (ref 30.0–36.0)
MCV: 92.6 fL (ref 78.0–100.0)
Platelets: 151 10*3/uL (ref 150–400)
RBC: 2.7 MIL/uL — ABNORMAL LOW (ref 4.22–5.81)
RDW: 15.6 % — ABNORMAL HIGH (ref 11.5–15.5)
WBC: 6.2 10*3/uL (ref 4.0–10.5)

## 2013-07-10 MED ORDER — ENOXAPARIN SODIUM 80 MG/0.8ML ~~LOC~~ SOLN: 75.0000 mg | SUBCUTANEOUS | Status: DC

## 2013-07-10 NOTE — Progress Notes (Signed)
  Date: 07/10/2013  Patient name: [name]  Medical record number: [id number]  Date of birth: 10-28-1924   This patient has been seen and the plan of care was discussed with the house staff. Please see their note for complete details. I concur with their findings with the following additions/corrections: Mr Haisley was thirsty today and requested water. He had no other complaints. His creatinine is trending down. Will stop IVF and Dr Mechele Claude talked to daughter about ensuring he has plenty to drink at the facility. HgB slight trend down but overall at new baseline. Baseline prior to TURP was 12 ish. Due to bleeding with TURP, new HgB about 8. Will need BMP and CBC at facility to F/U. Also will need removal of IVC filter if cont to tolerate anticoagulation. D/C to SNF.  Bartholomew Crews, MD 07/10/2013, 1:58 PM

## 2013-07-10 NOTE — Progress Notes (Signed)
OT Cancellation Note  Patient Details Name: [name] MRN: [id number] DOB: 12-31-1924   Cancelled Treatment:    Reason Eval/Treat Not Completed: Other (comment) Pt admitted from SNF and plan to return to SNF. Will defer OT to SNF at this time. If eval needed for admission, please call [phone] or page me at [phone]. Thank you. [place], [place]  [phone] 07/10/2013 07/10/2013, 12:20 PM

## 2013-07-10 NOTE — Progress Notes (Signed)
Clinical Social Work Department BRIEF PSYCHOSOCIAL ASSESSMENT 07/10/2013  Patient:  [name], [name]     Account Number:  [id number]     Admit date:  07/08/2013  Clinical Social Worker:  Adair Laundry  Date/Time:  07/10/2013 11:30 AM  Referred by:  Physician  Date Referred:  07/10/2013 Referred for  SNF Placement   Other Referral:   Interview type:  Family Other interview type:   Spoke with pt daughter on the phone    PSYCHOSOCIAL DATA Living Status:  FACILITY Admitted from facility:  [facility] Level of care:  [place] Primary support name:  [name] Primary support relationship to patient:  CHILD, ADULT Degree of support available:   Pt has good support    CURRENT CONCERNS Current Concerns  Post-Acute Placement   Other Concerns:    SOCIAL WORK ASSESSMENT / PLAN CSW spoke with pt daugher [name] about pt discharge today. She confirmed pt was from [facility] and they would be okay with him returning. However, she is requesting CSW fax referral to [facility]. If possible, she would like for pt to finish ST rehab at [facility]. Pt daughter aware this may not be possible. She is agreeable to pt returning to [facility] should facility switch not be possible.    CSW sent clinicals to [facility] and called to request decision on bed offer as soon as possible.   Assessment/plan status:  Psychosocial Support/Ongoing Assessment of Needs Other assessment/ plan:   Information/referral to community resources:   None needed    PATIENT'S/FAMILY'S RESPONSE TO PLAN OF CARE: Pt family agreeable to SNF dc for continued [facility] rehab       [place], [place]

## 2013-07-10 NOTE — Progress Notes (Addendum)
CSW (Clinical Education officer, museum) spoke with pt daughter and informed [facility] can offer a bed but will need copay paid upfront today. Pt daughter informed CSW she would be unable to work this out for today. CSW called [facility] and asked to please keep pt information on hand as pt daughter may call in next few days with having pt transferred. Pt daughter wanting pt to be discharged back to [facility] today. Facility confirmed pt is able to dc bck. CSW called and notified MD.   CSW (Clinical Social Worker) prepared pt dc packet and placed with shadow chart. CSW arranged non-emergent ambulance transport. Pt family, pt nurse, and facility informed. CSW signing off.  [place], [place]

## 2013-07-10 NOTE — Progress Notes (Signed)
Report called to [facility]. Spoke with Geologist, engineering. SBAR used to give report. Opportunity to ask questions was given. Pt will be transported by ambulance back to facility.

## 2013-07-10 NOTE — Progress Notes (Signed)
Notified on call MD, made aware of patients hemoglobin 8.0 and Hct 27.1, no new orders given. Will continue to monitor patient.

## 2013-07-10 NOTE — Progress Notes (Signed)
Subjective: Patient sitting upright in bed, alert and answering questions appropriately. No pain or other complaints except that he is thirsty and requesting water. I spoke with the daughter who is in the process of picking SNF options. She is in agreement with discharge today.  Objective: Vital signs in last 24 hours: Filed Vitals:   07/10/13 0844 07/10/13 0847 07/10/13 0850 07/10/13 1345  BP: 124/76 129/69 126/58 120/78  Pulse: 85 89 98 86  Temp: 97.5 F (36.4 C) 98.1 F (36.7 C)  97.9 F (36.6 C)  TempSrc: Oral Oral  Oral  Resp: 18 18 18 18   Height:      Weight:      SpO2: 97% 99% 90% 97%   Weight change:   Intake/Output Summary (Last 24 hours) at 07/10/13 1448 Last data filed at 07/10/13 1307  Gross per 24 hour  Intake    243 ml  Output    800 ml  Net   -557 ml   Physical exam: General: thin, elderly man; alert, cooperative, NAD HEENT: NCAT, vision grossly intact Neck: supple Lungs: CTAB Heart: regular rate and rhythm, no murmurs, gallops, or rubs Abdomen: soft, thin, non-tender, non-distended, normal bowel sounds  Extremities: warm, trace pedal edema  Neurologic: alert & oriented X3, cranial nerves II-XII intact, dysarthric, moves all extremities spontaneously  Lab Results: Basic Metabolic Panel:  Recent Labs Lab 07/09/13 0730 07/10/13 0538  NA 140 136*  K 5.5* 5.2  CL 107 107  CO2 24 19  GLUCOSE 100* 81  BUN 34* 34*  CREATININE 2.45* 2.17*  CALCIUM 12.2* 11.1*   CBC:  Recent Labs Lab 07/08/13 1309 07/09/13 0048 07/10/13 0538  WBC 10.3 8.0 6.2  NEUTROABS 8.4*  --   --   HGB 9.4* 8.6* 8.0*  HCT 29.8* 27.1* 25.0*  MCV 93.4 93.4 92.6  PLT 224 165 151   Cardiac Enzymes:  Recent Labs Lab 07/08/13 1309 07/08/13 1900 07/09/13 0048  TROPONINI <0.30 <0.30 <0.30   CBG:  Recent Labs Lab 07/08/13 1331  GLUCAP 154*   Micro Results: Recent Results (from the past 240 hour(s))  URINE CULTURE     Status: None   Collection Time   07/09/13 12:56 PM      Result Value Ref Range Status   Specimen Description URINE, RANDOM   Final   Special Requests NONE   Final   Culture  Setup Time     Final   Value: 07/09/2013 18:42     Performed at [facility]     Final   Value: NO GROWTH     Performed at [facility]   Culture     Final   Value: NO GROWTH     Performed at [facility]   Report Status 07/10/2013 FINAL   Final   Studies/Results: Dg Chest 1 View  07/09/2013   CLINICAL DATA:  Ill-defined infiltrate or mass at the left upper lobe. PA chest radiograph suggested for further evaluation.  EXAM: CHEST - 1 VIEW  COMPARISON:  Chest radiograph performed earlier today at 1:47 p.m.  FINDINGS: Left upper lung zone density has resolved, and appears to have reflected the left first rib. No mass is seen. No pleural effusion or pneumothorax identified.  The cardiomediastinal silhouette remains normal in size. No acute osseous abnormalities are seen.  IMPRESSION: Left upper lung zone density has resolved; it appears to have reflected the left first rib. No mass seen. No acute cardiopulmonary process  identified.   Electronically Signed   By: Garald Balding M.D.   On: 07/09/2013 04:09   Ct Head Wo Contrast  07/09/2013   CLINICAL DATA:  Stroke, altered mental status.  EXAM: CT HEAD WITHOUT CONTRAST  TECHNIQUE: Contiguous axial images were obtained from the base of the skull through the vertex without intravenous contrast.  COMPARISON:  CT HEAD W/O CM dated 04/17/2013  FINDINGS: No intraparenchymal hemorrhage, mass effect or midline shift. Confluent supratentorial white matter hypodensities are similar. Right medial occipital lobe and right cerebellar encephalomalacia again seen. Multiple small left cerebellar remote infarcts. Punctate hyperdensities within the bilateral basal ganglia and thalamus likely reflect remote lacunar infarcts. No hydrocephalus.  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 No abnormal extra-axial fluid collections. No skull fracture. Remote left medial orbital wall fracture. Trace paranasal sinus mucosal thickening without air-fluid levels. Mastoid air cells are well aerated.  IMPRESSION: No acute intracranial process.  Moderate to severe atrophy. Severe white matter changes suggest chronic small vessel ischemic disease with remote right occipital and cerebellar infarcts, in addition to scattered lacunar infarcts of the basal ganglia/ thalamus and remote left cerebellar infarcts.  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   Electronically Signed   By: Elon Alas   On: 07/09/2013 00:05   Medications: I have reviewed the patient's current medications. Scheduled Meds: . atorvastatin  20 mg Oral q1800  . clopidogrel  75 mg Oral Q breakfast  . enoxaparin (LOVENOX) injection  75 mg Subcutaneous Q24H  . nystatin  5 mL Oral QID  . omega-3 acid ethyl esters  1 g Oral Daily  . sodium chloride  3 mL Intravenous Q12H   Continuous Infusions: . sodium chloride 75 mL/hr at 07/09/13 1300   PRN Meds:.  Assessment/Plan:  # Hypotension with acute encephalopathy: Patient remains stable and A&O x 2 (not to time). Blood pressure remains stable 103-126/70s even after rate of IVF decreased to 75cc/hr. AKI is improving. Pt still orthostatic per DBP, though seemingly has improved and is not symptomatic. I suspect this episode was caused by hypovolemia 2/2 poor fluid intake at SNF. I discussed with the patient's daughter about making sure patient has some kind of fluid next to him at all times while at the SNF. She agrees and plans to have a meeting at SNF to discuss this as she thinks pt was not receiving enough fluids as well. Pt is stable for discharge back to [facility] today. He has follow up with [facility]. -f/u UCx (NGTD) -discharge back to [facility] today  # Recent LLE DVT s/p IVC filter placement: Stable. No hematuria on UA. Restarted anticoagulation yesterday, no  hematuria today. Will need outpatient follow up with IR for IVC filter removal if pt continues to tolerate anticoagulation. Will need CBC at follow up to assess for bleeding. -continue therapeutic lovenox  # HTN: Blood pressure low-normal overnight. Pt on metoprolol 25mg  BID and Lotensin 20mg  daily at home. Holding home antihypertensives given hypotension. Will not restart at discharge.  # Acute on Chronic CKD stage 2: Continues to improve, Cr 2.58 on admission-->2.17 today (with baseline Cr ~1). Suspect prerenal, FENa <.5%. Needs to continue drinking more fluids on discharge.  -IVF at 75cc/hr  # Oral thrush: Improving. Pt wears dentures. With white film on tongue/oropharynx on admission.  -nystatin swish/swallow   # Hx of CVA: Stable.  -continue home plavix and lipitor   # Primary hyperparathyroidism: Improving. Ca today downtrended from 13.1 on admission to 11.1  today. Of note, hx of hypercalcemia with inappropriately normal PTH level. Will need BMP at follow up. -IVF as above   # VTE: lovenox (therapeutic) # Diet: heart healthy  Code status: full  Dispo: Discharge today to SNF  The patient does have a current PCP Joni Reining, DO) and does need an [facility] hospital follow-up appointment after discharge.  The patient does not have transportation limitations that hinder transportation to clinic appointments.  .Services Needed at time of discharge: Y = Yes, Blank = No PT:   OT:   RN:   Equipment:   Other:     LOS: 2 days   Rebecca Eaton, MD 07/10/2013, 2:48 PM

## 2013-07-10 NOTE — Progress Notes (Addendum)
Clinical Social Work Department CLINICAL SOCIAL WORK PLACEMENT NOTE 07/10/2013  Patient:  BURL, TAUZIN  Account Number:  [id number] Admit date:  07/08/2013  Clinical Social Worker:  Adair Laundry  Date/time:  07/10/2013 11:30 AM  Clinical Social Work is seeking post-discharge placement for this patient at the following level of care:   [place]   (*CSW will update this form in Epic as items are completed)   07/10/2013  Patient/family provided with [place] Department of Clinical Social Work's list of facilities offering this level of care within the geographic area requested by the patient (or if unable, by the patient's family).  07/10/2013  Patient/family informed of their freedom to choose among providers that offer the needed level of care, that participate in Medicare, Medicaid or managed care program needed by the patient, have an available bed and are willing to accept the patient.  07/10/2013  Patient/family informed of [facility]' ownership interest in [facility], as well as of the fact that they are under no obligation to receive care at this facility.  PASARR submitted to EDS on Existing PASARR number received from EDS on   FL2 transmitted to all facilities in geographic area requested by pt/family on  07/10/2013 FL2 transmitted to all facilities within larger geographic area on   Patient informed that his/her managed care company has contracts with or will negotiate with  certain facilities, including the following:     Patient/family informed of bed offers received:  07/10/2013 Patient chooses bed at [facility] Physician recommends and patient chooses bed at    Patient to be transferred to [facility] on  07/10/2013 Patient to be transferred to facility by [facility]  The following physician request were entered in Epic:   Additional Comments:  [place], [place]

## 2013-07-10 NOTE — Discharge Instructions (Signed)
Please stop taking your blood pressure medications lopressor and lotensin as your blood pressure is normal now. You may need to restart these medications but only do so if your doctor tells you to.  Keep well hydrated!  Dehydration, Adult Dehydration is when you lose more fluids from the body than you take in. Vital organs like the kidneys, brain, and heart cannot function without a proper amount of fluids and salt. Any loss of fluids from the body can cause dehydration.  CAUSES   Vomiting.  Diarrhea.  Excessive sweating.  Excessive urine output.  Fever. SYMPTOMS  Mild dehydration  Thirst.  Dry lips.  Slightly dry mouth. Moderate dehydration  Very dry mouth.  Sunken eyes.  Skin does not bounce back quickly when lightly pinched and released.  Dark urine and decreased urine production.  Decreased tear production.  Headache. Severe dehydration  Very dry mouth.  Extreme thirst.  Rapid, weak pulse (more than 100 beats per minute at rest).  Cold hands and feet.  Not able to sweat in spite of heat and temperature.  Rapid breathing.  Blue lips.  Confusion and lethargy.  Difficulty being awakened.  Minimal urine production.  No tears. DIAGNOSIS  Your caregiver will diagnose dehydration based on your symptoms and your exam. Blood and urine tests will help confirm the diagnosis. The diagnostic evaluation should also identify the cause of dehydration. TREATMENT  Treatment of mild or moderate dehydration can often be done at home by increasing the amount of fluids that you drink. It is best to drink small amounts of fluid more often. Drinking too much at one time can make vomiting worse. Refer to the home care instructions below. Severe dehydration needs to be treated at the hospital where you will probably be given intravenous (IV) fluids that contain water and electrolytes. HOME CARE INSTRUCTIONS   Ask your caregiver about specific rehydration  instructions.  Drink enough fluids to keep your urine clear or pale yellow.  Drink small amounts frequently if you have nausea and vomiting.  Eat as you normally do.  Avoid:  Foods or drinks high in sugar.  Carbonated drinks.  Juice.  Extremely hot or cold fluids.  Drinks with caffeine.  Fatty, greasy foods.  Alcohol.  Tobacco.  Overeating.  Gelatin desserts.  Wash your hands well to avoid spreading bacteria and viruses.  Only take over-the-counter or prescription medicines for pain, discomfort, or fever as directed by your caregiver.  Ask your caregiver if you should continue all prescribed and over-the-counter medicines.  Keep all follow-up appointments with your caregiver. SEEK MEDICAL CARE IF:  You have abdominal pain and it increases or stays in one area (localizes).  You have a rash, stiff neck, or severe headache.  You are irritable, sleepy, or difficult to awaken.  You are weak, dizzy, or extremely thirsty. SEEK IMMEDIATE MEDICAL CARE IF:   You are unable to keep fluids down or you get worse despite treatment.  You have frequent episodes of vomiting or diarrhea.  You have blood or green matter (bile) in your vomit.  You have blood in your stool or your stool looks black and tarry.  You have not urinated in 6 to 8 hours, or you have only urinated a small amount of very dark urine.  You have a fever.  You faint. MAKE SURE YOU:   Understand these instructions.  Will watch your condition.  Will get help right away if you are not doing well or get worse. Document Released: 02/27/2005 Document  Revised: 05/22/2011 Document Reviewed: 10/17/2010 [facility] Patient Information 2014 [place], [place].   Orthostatic Hypotension Orthostatic hypotension is a sudden fall in blood pressure. It occurs when a person goes from a sitting or lying position to a standing position. CAUSES   Loss of body fluids (dehydration).  Medicines that lower blood  pressure.  Sudden changes in posture, such as sudden standing when you have been sitting or lying down.  Taking too much of your medicine. SYMPTOMS   Lightheadedness or dizziness.  Fainting or near-fainting.  A fast heart rate (tachycardia).  Weakness.  Feeling tired (fatigue). DIAGNOSIS  Your caregiver may find the cause of orthostatic hypotension through:  A history and/or physical exam.  Checking your blood pressure. Your caregiver will check your blood pressure when you are:  Lying down.  Sitting.  Standing.  Tilt table testing. In this test, you are placed on a table that goes from a lying position to a standing position. You will be strapped to the table. This test helps to monitor your blood pressure and heart rate when you are in different positions. TREATMENT   If orthostatic hypotension is caused by your medicines, your caregiver will need to adjust your dosage. Do not stop or adjust your medicine on your own.  When changing positions, make these changes slowly. This allows your body to adjust to the different position.  Compression stockings that are worn on your lower legs may be helpful.  Your caregiver may have you consume extra salt. Do not add extra salt to your diet unless directed by your caregiver.  Eat frequent, small meals. Avoid sudden standing after eating.  Avoid hot showers or excessive heat.  Your caregiver may give you fluids through the vein (intravenous).  Your caregiver may put you on medicine to help enhance fluid retention. SEEK IMMEDIATE MEDICAL CARE IF:   You faint or have a near-fainting episode. Call your local emergency services (911 in [place]  You have or develop chest pain.  You feel sick to your stomach (nauseous) or vomit.  You have a loss of feeling or movement in your arms or legs.  You have difficulty talking, slurred speech, or you are unable to talk.  You have difficulty thinking or have confused thinking. MAKE  SURE YOU:   Understand these instructions.  Will watch your condition.  Will get help right away if you are not doing well or get worse. Document Released: 02/17/2002 Document Revised: 05/22/2011 Document Reviewed: 06/12/2008 [facility] Patient Information 2014 [place], [place].

## 2013-07-14 ENCOUNTER — Non-Acute Institutional Stay (SKILLED_NURSING_FACILITY): Payer: Medicare Other | Admitting: Internal Medicine

## 2013-07-14 ENCOUNTER — Encounter: Payer: Self-pay | Admitting: Internal Medicine

## 2013-07-14 ENCOUNTER — Other Ambulatory Visit: Payer: Self-pay | Admitting: *Deleted

## 2013-07-14 DIAGNOSIS — G9340 Encephalopathy, unspecified: Secondary | ICD-10-CM

## 2013-07-14 DIAGNOSIS — R339 Retention of urine, unspecified: Secondary | ICD-10-CM

## 2013-07-14 DIAGNOSIS — I951 Orthostatic hypotension: Secondary | ICD-10-CM

## 2013-07-14 DIAGNOSIS — I10 Essential (primary) hypertension: Secondary | ICD-10-CM

## 2013-07-14 DIAGNOSIS — E210 Primary hyperparathyroidism: Secondary | ICD-10-CM

## 2013-07-14 DIAGNOSIS — I82409 Acute embolism and thrombosis of unspecified deep veins of unspecified lower extremity: Secondary | ICD-10-CM

## 2013-07-14 DIAGNOSIS — I69993 Ataxia following unspecified cerebrovascular disease: Secondary | ICD-10-CM

## 2013-07-14 MED ORDER — TRAMADOL-ACETAMINOPHEN 37.5-325 MG PO TABS: 1.0000 | ORAL_TABLET | Freq: Four times a day (QID) | ORAL | Status: DC | PRN

## 2013-07-14 NOTE — Progress Notes (Signed)
MRN: [id number] Name: [name]  Sex: male Age: 80 y.o. DOB: 1924/04/29  [place] #: Helene Kelp Facility/Room: 129A Level Of Care: SNF Provider: Hennie Duos Emergency Contacts: Extended Emergency Contact Information Primary Emergency Contact: Rembert,[name] Address: [address]          [place], [place] [address] Johnnette Litter of [place] Phone: [phone] Work Phone: [phone] Mobile Phone: [phone] Relation: Daughter  Code Status:   Allergies: Review of patient's allergies indicates no known allergies.  Chief Complaint  Patient presents with  . nursing home admission    HPI: Patient is 80 y.o. male who was admitted to hospiatl with MS change felt to be 2/2 hypotension 2/2 poor po intake. Pt is improved and admitted back to SNF.  Past Medical History  Diagnosis Date  . Hypertension   . Difficulty hearing, right     BILATERAL HEARING LOSS - BEST TO TRY TO SPEAK INTO LEFT EAR  . Meningioma   . Frequent falls   . BPH (benign prostatic hypertrophy)     TURP 05/19/13  . Chronic kidney disease     CHRONIC KIDNEY DISEASE, 2  . Anemia   . Thrombocytopenia   . Stroke     Cerebellar, 2013; WALKS WITH WALKER, ABLE TO DRESS AND BATHE HIMSELF BUT FAMILY TRIES TO PROVIDE SUPERVISION BECAUSE OF HIS HX OF FALL AND WEAKNESS LEGS, ARMS   . Incontinence of urine     SOME INCONTINENCE  . Hyperlipidemia     Past Surgical History  Procedure Laterality Date  . Tube put in ear yrs ago    . Transurethral resection of prostate N/A 05/19/2013    Procedure: TRANSURETHRAL RESECTION OF THE PROSTATE WITH GYRUS INSTRUMENTS;  Surgeon: Ailene Rud, MD;  Location: [facility];  Service: Urology;  Laterality: N/A;  . Cystoscopy N/A 06/13/2013    Procedure: CYSTOSCOPY FLEXIBLE BEDSIDE;  Surgeon: Ardis Hughs, MD;  Location: [place];  Service: Urology;  Laterality: N/A;      Medication List       This list is accurate as of: 07/14/13  6:56 PM.  Always use your most recent  med list.               atorvastatin 20 MG tablet  Commonly known as:  LIPITOR  Take 20 mg by mouth daily at 6 PM.     bisacodyl 10 MG suppository  Commonly known as:  DULCOLAX  Place 10 mg rectally daily as needed for moderate constipation.     clopidogrel 75 MG tablet  Commonly known as:  PLAVIX  Take 75 mg by mouth daily with breakfast.     enoxaparin 80 MG/0.8ML injection  Commonly known as:  LOVENOX  Inject 0.75 mLs (75 mg total) into the skin daily.     GERITOL PO  Take 1 tablet by mouth daily.     magnesium hydroxide 800 MG/5ML suspension  Commonly known as:  MILK OF MAGNESIA  Take 30 mLs by mouth daily as needed for constipation.     omega-3 acid ethyl esters 1 G capsule  Commonly known as:  LOVAZA  Take 1 g by mouth daily.     RA SALINE ENEMA 19-7 GM/118ML Enem  Place 1 each rectally daily as needed (for constipation not relieved by Bisacodyl).     traMADol-acetaminophen 37.5-325 MG per tablet  Commonly known as:  ULTRACET  Take 1 tablet by mouth every 6 (six) hours as needed for moderate pain.     VITAMIN  D PO  Take 1 tablet by mouth daily.        No orders of the defined types were placed in this encounter.    Immunization History  Administered Date(s) Administered  . Influenza,inj,Quad PF,36+ Mos 02/03/2013  . Pneumococcal Polysaccharide-23 02/03/2013    History  Substance Use Topics  . Smoking status: Former Smoker    Types: Cigarettes  . Smokeless tobacco: Not on file  . Alcohol Use: No     Comment: QUIT SMOKING 4 YRS AGO    Family history is noncontributory    Review of Systems  DATA OBTAINED: from patient, nurse; pt has no c/o  GENERAL: Feels better no fevers,+ fatigue, SKIN: No itching, rash or wounds EYES: No eye pain, redness, discharge EARS: No earache, tinnitus, change in hearing NOSE: No congestion, drainage or bleeding  MOUTH/THROAT: No mouth or tooth pain, No sore throat RESPIRATORY: No cough, wheezing, SOB CARDIAC:  No chest pain, palpitations, lower extremity edema  GI: No abdominal pain, No N/V/D or constipation, No heartburn or reflux  GU: No dysuria, frequency or urgency, or incontinence  MUSCULOSKELETAL: No unrelieved bone/joint pain NEUROLOGIC: No headache, dizziness or focal weakness PSYCHIATRIC: No overt anxiety or sadness. Sleeps well. No behavior issue.   Filed Vitals:   07/14/13 1830  BP: 186/85  Pulse: 110  Temp: 99.5 F (37.5 C)  Resp: 18    Physical Exam  GENERAL APPEARANCE: Alert, mod conversant. Appropriately groomed. No acute distress.  SKIN: No diaphoresis rash HEAD: Normocephalic, atraumatic  EYES: Conjunctiva/lids clear. Pupils round, reactive. EOMs intact.  EARS: External exam WNL, canals clear. Hearing grossly normal.  NOSE: No deformity or discharge.  MOUTH/THROAT: Lips w/o lesions RESPIRATORY: Breathing is even, unlabored. Lung sounds are clear   CARDIOVASCULAR: Heart RRR no murmurs, rubs or gallops. No peripheral edema.   GASTROINTESTINAL: Abdomen is soft, non-tender, not distended w/ normal bowel sounds GENITOURINARY: Bladder non tender, not distended  MUSCULOSKELETAL: No abnormal joints or musculature NEUROLOGIC: Oriented X2. Cranial nerves 2-12 grossly intact. Moves all extremities no tremor. PSYCHIATRIC: Mood and affect appropriate to situation, no behavioral issues  Patient Active Problem List   Diagnosis Date Noted  . Orthostatic hypotension 07/10/2013  . Hyperkalemia 07/09/2013  . Acute encephalopathy 07/08/2013  . Acute on chronic kidney failure 06/23/2013  . Hyperlipidemia   . Urinary retention 06/15/2013  . Hypercalcemia 06/13/2013  . Hematuria, gross 06/13/2013  . Normocytic anemia 06/11/2013  . Leg DVT (deep venous thromboembolism), acute 06/10/2013  . Physical deconditioning 05/30/2013  . Benign prostatic hypertrophy 05/19/2013  . BPH (benign prostatic hyperplasia) 04/24/2013  . Preoperative examination 04/24/2013  . Primary  hyperparathyroidism 09/02/2012  . Mild anemia and thrombocytopenia 08/30/2012  . Cerumen impaction 08/30/2012  . Health care maintenance 08/30/2012  . Frequent falls 06/23/2012  . Thrombotic stroke involving right cerebellar artery 09/15/2011  . 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 Hearing loss 07/20/2011  . Meningioma 07/20/2011  . HTN (hypertension) 07/19/2011  . CVA (cerebral infarction) 07/19/2011    CBC    Component Value Date/Time   WBC 6.2 07/10/2013 0538   RBC 2.70* 07/10/2013 0538   HGB 8.0* 07/10/2013 0538   HCT 25.0* 07/10/2013 0538   PLT 151 07/10/2013 0538   MCV 92.6 07/10/2013 0538   LYMPHSABS 1.3 07/08/2013 1309   MONOABS 0.6 07/08/2013 1309   EOSABS 0.0 07/08/2013 1309   BASOSABS 0.0 07/08/2013 1309    CMP     Component Value Date/Time   NA 136*  07/10/2013 0538   K 5.2 07/10/2013 0538   CL 107 07/10/2013 0538   CO2 19 07/10/2013 0538   GLUCOSE 81 07/10/2013 0538   BUN 34* 07/10/2013 0538   CREATININE 2.17* 07/10/2013 0538   CREATININE 0.94 04/24/2013 1648   CALCIUM 11.1* 07/10/2013 0538   PROT 6.2 04/24/2013 1648   ALBUMIN 2.0* 06/13/2013 0430   AST 37 04/24/2013 1648   ALT 28 04/24/2013 1648   ALKPHOS 56 04/24/2013 1648   BILITOT 0.6 04/24/2013 1648   GFRNONAA 26* 07/10/2013 0538   GFRNONAA 73 04/24/2013 1648   GFRAA 30* 07/10/2013 0538   GFRAA 85 04/24/2013 1648    Assessment and Plan  Acute encephalopathy 2/2 hypotension, found to be orthostatic and likely volume depleted 2/2 poor PO intake   Orthostatic hypotension 2/2 poor po intake; BP meds stooped;may need to be added back  HTN (hypertension) Lotensin and lopressor stopped;pr hypertensive and tachy today;will add back lopressor 25 mg BID and monitor  Primary hyperparathyroidism Admit Ca+ 13.1; trended down with hydration;recheck  Leg DVT (deep venous thromboembolism), acute Lovenox for anticoagulation was started in hospital ;will monitor to see if pt tolerates; IVC filter is in  place from last admit  Urinary retention U/A with Cand S done in hosp is final-no growth.  Ataxia, late effect of cerebrovascular disease No change;still with residual ataxia and dysarthria; on lovenox for DVT also and lipitor    Hennie Duos, MD

## 2013-07-14 NOTE — Assessment & Plan Note (Signed)
No change;still with residual ataxia and dysarthria; on lovenox for DVT also and lipitor

## 2013-07-14 NOTE — Assessment & Plan Note (Signed)
Lotensin and lopressor stopped;pr hypertensive and tachy today;will add back lopressor 25 mg BID and monitor

## 2013-07-14 NOTE — Assessment & Plan Note (Signed)
Admit Ca+ 13.1; trended down with hydration;recheck

## 2013-07-14 NOTE — Assessment & Plan Note (Signed)
Lovenox for anticoagulation was started in hospital ;will monitor to see if pt tolerates; IVC filter is in place from last admit

## 2013-07-14 NOTE — Assessment & Plan Note (Signed)
2/2 poor po intake; BP meds stooped;may need to be added back

## 2013-07-14 NOTE — Telephone Encounter (Signed)
[facility] 

## 2013-07-14 NOTE — Assessment & Plan Note (Signed)
U/A with Cand S done in hosp is final-no growth.

## 2013-07-14 NOTE — Assessment & Plan Note (Signed)
2/2 hypotension, found to be orthostatic and likely volume depleted 2/2 poor PO intake

## 2013-07-17 ENCOUNTER — Ambulatory Visit: Payer: Medicare Other | Admitting: Internal Medicine

## 2013-07-18 ENCOUNTER — Ambulatory Visit: Payer: Medicare Other | Admitting: Internal Medicine

## 2013-07-29 ENCOUNTER — Non-Acute Institutional Stay (SKILLED_NURSING_FACILITY): Payer: Medicare Other | Admitting: Nurse Practitioner

## 2013-07-29 DIAGNOSIS — E785 Hyperlipidemia, unspecified: Secondary | ICD-10-CM

## 2013-07-29 DIAGNOSIS — I82409 Acute embolism and thrombosis of unspecified deep veins of unspecified lower extremity: Secondary | ICD-10-CM

## 2013-07-29 DIAGNOSIS — R296 Repeated falls: Secondary | ICD-10-CM

## 2013-07-29 DIAGNOSIS — Z9181 History of falling: Secondary | ICD-10-CM

## 2013-07-29 DIAGNOSIS — G9340 Encephalopathy, unspecified: Secondary | ICD-10-CM

## 2013-07-29 DIAGNOSIS — I10 Essential (primary) hypertension: Secondary | ICD-10-CM

## 2013-07-29 DIAGNOSIS — D649 Anemia, unspecified: Secondary | ICD-10-CM

## 2013-07-29 DIAGNOSIS — R5381 Other malaise: Secondary | ICD-10-CM

## 2013-07-29 DIAGNOSIS — E210 Primary hyperparathyroidism: Secondary | ICD-10-CM

## 2013-07-29 NOTE — Progress Notes (Signed)
Patient ID: [name], male   DOB: 1924-06-06, 80 y.o.   MRN: [id number]    Nursing Home Location:  [place] of Service: SNF (31)  PCP: [name], DO  No Known Allergies  Chief Complaint  Patient presents with  . Medical Management of Chronic Issues    HPI:  Patient is 80 y.o. male with a pmh of CVA, hyperlipidemia, HTN, BPH who recently with TURP who was then diagnosed with  DVT and on anticoagulation with plavix, however started having hematuria, therefore IVC filter was placed an put on Lovenox and then admit to SNF for OT/PT for on-going rehab. During rehab pt had AMS change felt to be 2/2 hypotension 2/2 poor po intake he was hospitalized and this improved and was admitted back to SNF. Patient currently doing well with therapy, now stable to discharge home with daughter with home health.  Review of Systems:  Review of Systems  Constitutional: Negative for fever and chills.  Respiratory: Negative for cough and shortness of breath.   Cardiovascular: Negative for chest pain.  Gastrointestinal: Negative for heartburn and blood in stool.  Genitourinary: Negative for dysuria and urgency.  Musculoskeletal: Negative for joint pain and myalgias.  Skin: Negative for itching and rash.  Neurological: Negative for dizziness, tingling, tremors and headaches.  Psychiatric/Behavioral: Negative for depression.     Past Medical History  Diagnosis Date  . Hypertension   . Difficulty hearing, right     BILATERAL HEARING LOSS - BEST TO TRY TO SPEAK INTO LEFT EAR  . Meningioma   . Frequent falls   . BPH (benign prostatic hypertrophy)     TURP 05/19/13  . Chronic kidney disease     CHRONIC KIDNEY DISEASE, 2  . Anemia   . Thrombocytopenia   . Stroke     Cerebellar, 2013; WALKS WITH WALKER, ABLE TO DRESS AND BATHE HIMSELF BUT FAMILY TRIES TO PROVIDE SUPERVISION BECAUSE OF HIS HX OF FALL AND WEAKNESS LEGS, ARMS   . Incontinence of urine     SOME  INCONTINENCE  . Hyperlipidemia    Past Surgical History  Procedure Laterality Date  . Tube put in ear yrs ago    . Transurethral resection of prostate N/A 05/19/2013    Procedure: TRANSURETHRAL RESECTION OF THE PROSTATE WITH GYRUS INSTRUMENTS;  Surgeon: Ailene Rud, MD;  Location: [facility];  Service: Urology;  Laterality: N/A;  . Cystoscopy N/A 06/13/2013    Procedure: CYSTOSCOPY FLEXIBLE BEDSIDE;  Surgeon: Ardis Hughs, MD;  Location: [facility];  Service: Urology;  Laterality: N/A;   Social History:   reports that he has quit smoking. His smoking use included Cigarettes. He smoked 0.00 packs per day. He does not have any smokeless tobacco history on file. He reports that he does not drink alcohol or use illicit drugs.  Family History  Problem Relation Age of Onset  . Diabetes Mother   . Heart disease Mother     Medications: Patient's Medications  New Prescriptions   No medications on file  Previous Medications   ATORVASTATIN (LIPITOR) 20 MG TABLET    Take 20 mg by mouth daily at 6 PM.   BISACODYL (DULCOLAX) 10 MG SUPPOSITORY    Place 10 mg rectally daily as needed for moderate constipation.   CHOLECALCIFEROL (VITAMIN D PO)    Take 1 tablet by mouth daily.   CLOPIDOGREL (PLAVIX) 75 MG TABLET    Take 75 mg by mouth daily with breakfast.  ENOXAPARIN (LOVENOX) 80 MG/0.8ML INJECTION    Inject 0.75 mLs (75 mg total) into the skin daily.   IRON-VITAMINS (GERITOL PO)    Take 1 tablet by mouth daily.   MAGNESIUM HYDROXIDE (MILK OF MAGNESIA) 800 MG/5ML SUSPENSION    Take 30 mLs by mouth daily as needed for constipation.   METOPROLOL TARTRATE (LOPRESSOR) 25 MG TABLET    Take 25 mg by mouth 2 (two) times daily.   OMEGA-3 ACID ETHYL ESTERS (LOVAZA) 1 G CAPSULE    Take 1 g by mouth daily.   SODIUM PHOSPHATES (RA SALINE ENEMA) 19-7 GM/118ML ENEM    Place 1 each rectally daily as needed (for constipation not relieved by Bisacodyl).   TRAMADOL-ACETAMINOPHEN (ULTRACET) 37.5-325 MG PER TABLET     Take 1 tablet by mouth every 6 (six) hours as needed for moderate pain.  Modified Medications   No medications on file  Discontinued Medications   No medications on file     Physical Exam:  Filed Vitals:   07/29/13 1518  BP: 127/70  Pulse: 66  Temp: 98 F (36.7 C)  Resp: 18    Physical Exam  Constitutional: He is well-developed, well-nourished, and in no distress.  HENT:  Mouth/Throat: Oropharynx is clear and moist. No oropharyngeal exudate.  Eyes: Conjunctivae and EOM are normal. Pupils are equal, round, and reactive to light.  Neck: Normal range of motion. Neck supple.  Cardiovascular: Normal rate, regular rhythm and normal heart sounds.   Pulmonary/Chest: Effort normal and breath sounds normal.  Abdominal: Soft. Bowel sounds are normal.  Musculoskeletal: He exhibits no edema and no tenderness.  In [facility]  Neurological: He is alert.  Skin: Skin is warm and dry.  Psychiatric: Affect normal.     Labs reviewed: Basic Metabolic Panel:  Recent Labs  05/16/13 1455 06/10/13 1437  07/09/13 0048 07/09/13 0730 07/10/13 0538  NA 146 143  < > 138 140 136*  K 3.6* 4.7  < > 5.7* 5.5* 5.2  CL 106 106  < > 105 107 107  CO2 31 26  < > 21 24 19   GLUCOSE 118* 113*  < > 114* 100* 81  BUN 14 23  < > 34* 34* 34*  CREATININE 1.03 1.13  < > 2.47* 2.45* 2.17*  CALCIUM 11.1* 11.4*  < > 12.1* 12.2* 11.1*  MG  --  2.0  --   --   --   --   < > = values in this interval not displayed. Liver Function Tests:  Recent Labs  08/30/12 1209 04/17/13 1130 04/24/13 1648 06/13/13 0430  AST 18 23 37  --   ALT 13 16 28   --   ALKPHOS 68 68 56  --   BILITOT 0.5 0.4 0.6  --   PROT 6.8 7.1 6.2  --   ALBUMIN 3.7 3.6 3.4* 2.0*   No results found for this basename: LIPASE, AMYLASE,  in the last 8760 hours No results found for this basename: AMMONIA,  in the last 8760 hours CBC:  Recent Labs  04/17/13 1130  06/10/13 1437  07/08/13 1309 07/09/13 0048 07/10/13 0538  WBC 5.1  < > 7.2  <  > 10.3 8.0 6.2  NEUTROABS 4.2  --  5.3  --  8.4*  --   --   HGB 12.5*  < > 11.6*  < > 9.4* 8.6* 8.0*  HCT 36.5*  < > 35.1*  < > 29.8* 27.1* 25.0*  MCV 91.7  < > 92.4  < >  93.4 93.4 92.6  PLT 105*  < > 181  < > 224 165 151  < > = values in this interval not displayed. Cardiac Enzymes:  Recent Labs  07/08/13 1309 07/08/13 1900 07/09/13 0048  TROPONINI <0.30 <0.30 <0.30   CBC NO Diff (Complete Blood Count)    Result: 07/21/2013 4:00 PM   ( Status: F )     C WBC 4.7     4.0-10.5 K/uL SLN   RBC 2.79   L 4.22-5.81 MIL/uL SLN   Hemoglobin 8.3   L 13.0-17.0 g/dL SLN   Hematocrit 24.8   L 39.0-52.0 % SLN   MCV 88.9     78.0-100.0 fL SLN   MCH 29.7     26.0-34.0 pg SLN   MCHC 33.5     30.0-36.0 g/dL SLN   RDW 16.4   H 11.5-15.5 % SLN   Platelet Count 209     150-400 K/uL SLN   Basic Metabolic Panel    Result: 07/21/2013 4:48 PM   ( Status: F )       Sodium 144     135-145 mEq/L SLN   Potassium 3.7     3.5-5.3 mEq/L SLN   Chloride 110     96-112 mEq/L SLN   CO2 27     19-32 mEq/L SLN   Glucose 103   H 70-99 mg/dL SLN   BUN 17     6-23 mg/dL SLN   Creatinine 1.03     0.50-1.35 mg/dL SLN   Calcium 9.2     8.4-10.5 mg/dL SLN   Lipid Profile    Result: 07/21/2013 4:48 PM   ( Status: F )       Cholesterol 107     0-200 mg/dL SLN C Triglyceride 47     <150 mg/dL SLN   HDL Cholesterol 42     >39 mg/dL SLN   Total Chol/HDL Ratio 2.5      Ratio SLN   VLDL Cholesterol (Calc) 9     0-40 mg/dL SLN   LDL Cholesterol (Calc) 56     0-99 mg/dL SLN C  Assessment/Plan 1. Leg DVT (deep venous thromboembolism), acute -currently on lovenox - has IVC filter placed, and is on plavix -will dc lovenox at this time as he is preparing to go home and at a high risk for falls -conts plavix  2. HTN (hypertension) Stable, lopressor added back due to tachycardia pt tolerating well.   3. Primary hyperparathyroidism Calcium trending downwards, now at 9.2  4. Acute encephalopathy -resolved  5. Frequent  falls -conts with therapy and has not had a fall since he has been at [facility]  6. Normocytic anemia -stable  7. Hyperlipidemia -conts on lipitor   8. Physical deconditioning -pt is doing well with therapy and stabe for discharge-will need PT/OT per home health.DME needed WC. Rx written.  will need to follow up with PCP within 2 weeks.    35 mins plus time coordinating discharge and discussing plan of care with family  time greater than 50% of total time spent doing pt counseled and coordination of care regarding discharge.

## 2013-08-08 DIAGNOSIS — Z5189 Encounter for other specified aftercare: Secondary | ICD-10-CM

## 2013-08-08 DIAGNOSIS — I824Y9 Acute embolism and thrombosis of unspecified deep veins of unspecified proximal lower extremity: Secondary | ICD-10-CM

## 2013-08-08 DIAGNOSIS — I824Z9 Acute embolism and thrombosis of unspecified deep veins of unspecified distal lower extremity: Secondary | ICD-10-CM

## 2013-08-08 DIAGNOSIS — R262 Difficulty in walking, not elsewhere classified: Secondary | ICD-10-CM

## 2013-08-15 ENCOUNTER — Telehealth: Payer: Self-pay | Admitting: Internal Medicine

## 2013-08-15 ENCOUNTER — Ambulatory Visit (INDEPENDENT_AMBULATORY_CARE_PROVIDER_SITE_OTHER): Payer: Medicare Other | Admitting: Internal Medicine

## 2013-08-15 ENCOUNTER — Encounter: Payer: Self-pay | Admitting: Internal Medicine

## 2013-08-15 ENCOUNTER — Ambulatory Visit (HOSPITAL_COMMUNITY)
Admission: RE | Admit: 2013-08-15 | Discharge: 2013-08-15 | Disposition: A | Discharge status: Home / Self Care | Payer: Medicare Other | Source: Ambulatory Visit | Attending: Internal Medicine | Admitting: Internal Medicine

## 2013-08-15 ENCOUNTER — Ambulatory Visit: Payer: Medicare Other | Admitting: Internal Medicine

## 2013-08-15 VITALS — BP 150/83 | HR 59 | Temp 97.7°F | Wt 164.0 lb

## 2013-08-15 DIAGNOSIS — N189 Chronic kidney disease, unspecified: Secondary | ICD-10-CM

## 2013-08-15 DIAGNOSIS — N179 Acute kidney failure, unspecified: Secondary | ICD-10-CM

## 2013-08-15 DIAGNOSIS — N400 Enlarged prostate without lower urinary tract symptoms: Secondary | ICD-10-CM

## 2013-08-15 DIAGNOSIS — Z86718 Personal history of other venous thrombosis and embolism: Secondary | ICD-10-CM | POA: Insufficient documentation

## 2013-08-15 DIAGNOSIS — M7989 Other specified soft tissue disorders: Secondary | ICD-10-CM

## 2013-08-15 DIAGNOSIS — R296 Repeated falls: Secondary | ICD-10-CM

## 2013-08-15 DIAGNOSIS — R310 Gross hematuria: Secondary | ICD-10-CM

## 2013-08-15 DIAGNOSIS — I82409 Acute embolism and thrombosis of unspecified deep veins of unspecified lower extremity: Secondary | ICD-10-CM

## 2013-08-15 DIAGNOSIS — I129 Hypertensive chronic kidney disease with stage 1 through stage 4 chronic kidney disease, or unspecified chronic kidney disease: Secondary | ICD-10-CM

## 2013-08-15 DIAGNOSIS — E785 Hyperlipidemia, unspecified: Secondary | ICD-10-CM

## 2013-08-15 DIAGNOSIS — I69993 Ataxia following unspecified cerebrovascular disease: Secondary | ICD-10-CM

## 2013-08-15 DIAGNOSIS — N182 Chronic kidney disease, stage 2 (mild): Secondary | ICD-10-CM

## 2013-08-15 DIAGNOSIS — I10 Essential (primary) hypertension: Secondary | ICD-10-CM

## 2013-08-15 DIAGNOSIS — M25569 Pain in unspecified knee: Secondary | ICD-10-CM

## 2013-08-15 DIAGNOSIS — D649 Anemia, unspecified: Secondary | ICD-10-CM

## 2013-08-15 LAB — CBC WITH DIFFERENTIAL/PLATELET
Basophils Absolute: 0 10*3/uL (ref 0.0–0.1)
Basophils Relative: 0 % (ref 0–1)
Eosinophils Absolute: 0.2 10*3/uL (ref 0.0–0.7)
Eosinophils Relative: 3 % (ref 0–5)
HCT: 34.1 % — ABNORMAL LOW (ref 39.0–52.0)
Hemoglobin: 10.7 g/dL — ABNORMAL LOW (ref 13.0–17.0)
Lymphocytes Relative: 30 % (ref 12–46)
Lymphs Abs: 2.2 10*3/uL (ref 0.7–4.0)
MCH: 29.2 pg (ref 26.0–34.0)
MCHC: 31.4 g/dL (ref 30.0–36.0)
MCV: 93.2 fL (ref 78.0–100.0)
Monocytes Absolute: 0.5 10*3/uL (ref 0.1–1.0)
Monocytes Relative: 7 % (ref 3–12)
Neutro Abs: 4.4 10*3/uL (ref 1.7–7.7)
Neutrophils Relative %: 60 % (ref 43–77)
Platelets: 198 10*3/uL (ref 150–400)
RBC: 3.66 MIL/uL — ABNORMAL LOW (ref 4.22–5.81)
RDW: 15.1 % (ref 11.5–15.5)
WBC: 7.4 10*3/uL (ref 4.0–10.5)

## 2013-08-15 LAB — APTT: aPTT: 33 seconds (ref 24–37)

## 2013-08-15 LAB — PROTIME-INR
INR: 1.05 (ref <1.50)
Prothrombin Time: 13.5 seconds (ref 11.6–15.2)

## 2013-08-15 LAB — D-DIMER, QUANTITATIVE: D-Dimer, Quant: 3.77 ug/mL-FEU — ABNORMAL HIGH (ref 0.00–0.48)

## 2013-08-15 MED ORDER — JOBST 20-30MMHG COMPRESSION SM MISC: Status: DC

## 2013-08-15 NOTE — Assessment & Plan Note (Addendum)
No more recent falls, but high risk. Now wheelchair and walker. Needs home health PT/OT to continue. Hospital bed for mobility assistance, please see note for detailed documentation.  Fall Screening 08/30/2012 02/03/2013 04/24/2013 05/30/2013  Falls in the past year? Yes Yes Yes Yes  Number of falls in past year 2 or more 1 2 or more 2 or more  Was there an injury with Fall? - No - -  Risk Factor Category  High Fall Risk - High Fall Risk -    -place PT/OT order if still needed per [facility], unclear if this has been done from [facility] providers -requesting hospital bed, will place DME order and have social work follow up since daughter claims that have been in touch

## 2013-08-15 NOTE — Assessment & Plan Note (Signed)
Hb ~8 since hospital admissions. Hx of recent hematuria on anticoagulation for DVT s/p TURP. Prior baseline ~12. Was transfused with PRBCs on prior admissions.   -repeat cbc today -iron panel, currently normacytic and on Geritol but may benefit from iron supplementation given stable low Hb

## 2013-08-15 NOTE — Assessment & Plan Note (Deleted)
Hb ~8 since hospital admissions. Hx of recent hematuria on anticoagulation for DVT s/p TURP. Prior baseline ~12. Was transfused with PRBCs on prior admissions.   -repeat cbc today -iron panel, currently normacytic and on Geritol but may benefit from iron supplementation given stable low Hb

## 2013-08-15 NOTE — Assessment & Plan Note (Signed)
Trending down, last value 2.17, baseline Cr ~1.   -repeat bmet today

## 2013-08-15 NOTE — Patient Instructions (Addendum)
General Instructions:  We will call you with the results of his blood test today. Please get your insurance card corrected with Dr. Heber [place] as your pcp so we can get the ultrasound of his legs done.   If you develop any chest pain, shortness of breath, worsening swelling, pain in leg, call us right away [phone] and go to emergency room.   Please follow up with Dr. Heber [place] on next available  We will discuss with social work and pcp about hospital bed and place orders for home health pt/ot as soon as the insurance will allow  Thank you for bringing your medicines today. This helps [place] keep you safe from mistakes.   Progress Toward Treatment Goals:  Treatment Goal 08/15/2013  Blood pressure deteriorated  Prevent falls at goal    Self Care Goals & Plans:  Self Care Goal 05/30/2013  Manage my medications take my medicines as prescribed; refill my medications on time  Monitor my health keep track of my blood glucose; bring my glucose meter and log to each visit  Eat healthy foods eat baked foods instead of fried foods; eat foods that are low in salt; drink diet soda or water instead of juice or soda    No flowsheet data found.   Care Management & Community Referrals:  Referral 05/30/2013  Referrals made for care management support -  Referrals made to community resources exercise/physical therapy; falls prevention    Treatment Goals:  Goals (1 Years of Data) as of 08/15/13         07/29/13 07/14/13 07/10/13 07/10/13 07/10/13     Blood Pressure    . Blood Pressure < 150/90  127/70 186/85 120/78 126/58 129/69     Lifestyle    . Prevent Falls            Progress Toward Treatment Goals:  Treatment Goal 08/15/2013  Blood pressure deteriorated  Prevent falls at goal    Self Care Goals & Plans:  Self Care Goal 05/30/2013  Manage my medications take my medicines as prescribed; refill my medications on time  Monitor my health keep track of my blood glucose; bring my glucose meter and  log to each visit  Eat healthy foods eat baked foods instead of fried foods; eat foods that are low in salt; drink diet soda or water instead of juice or soda    No flowsheet data found.   Care Management & Community Referrals:  Referral 05/30/2013  Referrals made for care management support -  Referrals made to community resources exercise/physical therapy; falls prevention

## 2013-08-15 NOTE — Progress Notes (Signed)
Subjective:   Patient ID: [name] male   DOB: 1924-10-02 80 y.o.   MRN: [id number]  HPI: [name] is a 80 y.o. African American male with HTN, CKD2, BPH s/p TURP 05/2013, LLE DVT (06/10/2013) with IVF filter (06/16/13) s/p hematuria with anticoagulation, and CVA presenting to opc today for routine follow up.  He was recently discharged from SNF [facility] at the end of May with home health PT/OT recommended per last provider note on 07/29/13, but daughter is not sure if the order has been placed and was told by Alvis Lemmings who have come by the house she needs an order? Patient has no complaints today, no chest pain, SOB, abdominal pain.   LLE DVT--06/10/13, involving the left saphenofemoral, common femoral, profunda, femoral, popliteal, posterior tibial, and peroneal veins with superficial thrombus in the left greater saphenous vein. Appears to be provoked s/p TURP. Has completed approximately 3 months of anticoagulation with interruption 2/2 hematuria. IVC filter in place (placed 06/16/13), was on lovenox and discontinued in may per provider note 07/29/13. Today, the daughter believes the leg has recently been more swollen since discharge from SNF. Patient denies any tenderness to palpation. R leg has not been as swollen as left leg. Although he is at risk of falls, if he has new DVTs and despite filter in place, may need to restart anticoagulation.    HTN--restarted on BB due to tachycardia. Holding Lotensin in setting of orthostatic hypotension and last admission while at [facility] for AMS (d/c 07/10/13) and renal failure.  BP today 150/83. Compliant with Lopressor.   Hx of CVA and frequent falls in setting of ataxia and dysarthria s/p cva--home health PT/OT, wheelchair. Remains on plavix. No more falls since SNF, but daughter is requesting hospital bed and says she has been in touch with social work, possibly Ms. Jeralyn Ruths in regards to hospital bed. [name] has hx of CVA with residual ataxia and hx  of frequent falls and now LLE DVT with possible recurrence that requires his body to be positioned in ways not feasible with a normal bed.  He needs a bed that is able to lower to help assist with safely getting up from bed lying position and further out of bed to wheelchair.  His head needs to be elevated to at least 30 degrees or more to help assist with getting out of bed.  If this does not happen, he needs maximal assist to try to elevate and assist him. Finally, he is on Lovaza but daughter claims it is very expensive and insurance may not cover it. Can she take otc fish oil. Of note, last lipid panel 07/20/11 with LDL 91, TG 69, and chol 173.  Anemia, Hb down to 8s, baseline ~12, prior to hospital admission. Since DVT and hematuria, Hb ~8. Will recheck today along with iron panel. Of note, MCV 92.6  Acute on chronic renal failure--Cr baseline ~1, but up to 2.58 on 4/28 and trending down to 2.17 on 07/10/13. ACEi held, will recheck bmet today.   Past Medical History  Diagnosis Date  . Hypertension   . Difficulty hearing, right     BILATERAL HEARING LOSS - BEST TO TRY TO SPEAK INTO LEFT EAR  . Meningioma   . Frequent falls   . BPH (benign prostatic hypertrophy)     TURP 05/19/13  . Chronic kidney disease     CHRONIC KIDNEY DISEASE, 2  . Anemia   . Thrombocytopenia   . Stroke  Cerebellar, 2013; WALKS WITH WALKER, ABLE TO DRESS AND BATHE HIMSELF BUT FAMILY TRIES TO PROVIDE SUPERVISION BECAUSE OF HIS HX OF FALL AND WEAKNESS LEGS, ARMS   . Incontinence of urine     SOME INCONTINENCE  . Hyperlipidemia    Current Outpatient Prescriptions  Medication Sig Dispense Refill  . atorvastatin (LIPITOR) 20 MG tablet Take 20 mg by mouth daily at 6 PM.      . bisacodyl (DULCOLAX) 10 MG suppository Place 10 mg rectally daily as needed for moderate constipation.      . Cholecalciferol (VITAMIN D PO) Take 1 tablet by mouth daily.      . clopidogrel (PLAVIX) 75 MG tablet Take 75 mg by mouth daily with  breakfast.      . Iron-Vitamins (GERITOL PO) Take 1 tablet by mouth daily.      . magnesium hydroxide (MILK OF MAGNESIA) 800 MG/5ML suspension Take 30 mLs by mouth daily as needed for constipation.      . metoprolol tartrate (LOPRESSOR) 25 MG tablet Take 25 mg by mouth 2 (two) times daily.      Marland Kitchen omega-3 acid ethyl esters (LOVAZA) 1 G capsule Take 1 g by mouth daily.      . Sodium Phosphates (RA SALINE ENEMA) 19-7 GM/118ML ENEM Place 1 each rectally daily as needed (for constipation not relieved by Bisacodyl).      . traMADol-acetaminophen (ULTRACET) 37.5-325 MG per tablet Take 1 tablet by mouth every 6 (six) hours as needed for moderate pain.  120 tablet  5   No current facility-administered medications for this visit.   Family History  Problem Relation Age of Onset  . Diabetes Mother   . Heart disease Mother    History   Social History  . Marital Status: Widowed    Spouse Name: N/A    Number of Children: N/A  . Years of Education: N/A   Social History Main Topics  . Smoking status: Former Smoker    Types: Cigarettes  . Smokeless tobacco: Not on file  . Alcohol Use: No     Comment: QUIT SMOKING 50 YRS AGO  . Drug Use: No  . Sexual Activity: Not on file   Other Topics Concern  . Not on file   Social History Narrative   Lives with daughter. Ambulates w walker. Can make basic meals (cereal). Very hard of hearing.    Review of Systems:  Constitutional:  Denies fever, chills  HEENT:  Denies congestion  Respiratory:  Denies SOB, cough, and wheezing.   Cardiovascular:  Denies chest pain, palpitations. Leg swelling  Gastrointestinal:  Denies abdominal pain or constipation   Genitourinary:  Incontinence, hx of hematuria  Musculoskeletal:  Gait problem, LLE edema  Skin:  Shiny, no hair, skin discoloration  Neurological:  S/p CVA, wheelchair and walker for gait, slurred speech, hard on hearing   Objective:  Physical Exam: Filed Vitals:   08/15/13 1706  BP: 150/83  Pulse:  59  Temp: 97.7 F (36.5 C)  SpO2: 98%   Vitals reviewed. General: sitting in wheelchair, NAD, tall HEENT: EOMI Cardiac: RRR, no rubs, murmurs or gallops Pulm: clear to auscultation bilaterally, no wheezes, rales, or rhonchi Abd: soft, nontender, nondistended, BS present Ext: warm and well perfused, LLE pitting edema +2, worse around ankle and mid calf, non-tender, no erythema, no hair on legs. Mild edema of R ankle, dry feet, hardened long toe nails b/l feet Neuro: alert and oriented X3, strength and sensation to light touch equal in  bilateral upper and lower extremities, walks with walker, hard on hearing, slurred speech, defers to daughter for questions  Assessment & Plan:  Discussed with Dr. Beryle Beams ?recurrent DVT LLE: unclear baseline edema. IVC filter in place. LLE doppler pending insurance card change, Jobst compression stockings. If new clots, may restart anticoagulation with lovenox and coumadin. INR check and D-Dimer.  Anemia--repeat cbc, iron panel AKI--repeat bmet Requesting hospital bed, home health PT/OT--already in place? Follow up with sw and pcp

## 2013-08-15 NOTE — Telephone Encounter (Signed)
Received page from [facility] lab in the on-call pager in regards to pt's D-dimer which was elevated at 3.77. The patient has been seen by Dr. Eula Fried this afternoon. I spoke with Dr. Eula Fried who informed me that the patient just had LE Dopplers that were negative for new DVT and actually showed resolving DVT (per preliminary verbal report). Of note, pt had discussed this patient with attending Dr. Beryle Beams. Please refer to Dr. Demetrio Lapping and Dr. Azucena Freed notes for further details for the assessment and plan for this patient.

## 2013-08-15 NOTE — Assessment & Plan Note (Addendum)
Complicated situation with multiple confounding factors including patient's age, hx of cva on plavix and bleeding s/p TURP, and most notable for frequent falls. He had significant clot burden upon diagnosis of his DVT and subsequent placement of IVC filter, however, this obviously does not prevent new DVTs. Unfortunately, this is my first time seeing him in [facility], thus I do not have a baseline of his lower extremities and ?chronic edema, however, his very involved sister, primary caretaker, claims the left leg has been more swollen lately.  Fortunately, the leg is not tender at this time with no erythema or warmth, but does have significant unilateral pitting edema +2 compared to RLE. We are also limited today based on incorrect pcp on insurance card to order more studies if needed. He has no chest pain or shortness of breath.   Discussed and patient seen with Dr. Darnell Level in detail today.   -will get repeat cbc (assess if stable Hb or still bleeding), bmet (likely not a candidate for new anticoagulation medications at this time given renal function), coag studies, baseline d-dimer, and repeat LLE doppler ultrasound for possible new clots -pending these results, patient will likely need to restart anticoagulation with lovenox and coumadin and this was dicussed with daughter who is in agreement if needed but is concerned given his history of falls. She does report improvement in his falls since discharge with no recent events and that with home health PT/OT, wheelchair, walker, and hopefully hospital bed, the falls can be less frequent -if D-dimer negative, then more confident to leave off anticoagulation at this time, however, in the likely event that it is significantly positive, will likely further decision to start anticoagulation. Of course this will need check of current cbc to see for any possible ongoing bleeding and also evaluation by urology can assist to make sure no more hematuria. (he was unable to provide  u/a today). Prior coumadin doses ~7.5, would favor starting no greater than 5mg  at this time with lovenox 75mg  (1mg /kg, wt 164lb, ~74.5kg)  -in the meantime for swelling, elevate legs when at rest and apply Jobst compression stocking to LLE to help with the swelling -advised patient and daughter that if they notice worsening of edema, or sudden sob, or chest pain, or redness, pain of leg, to go to ED directly and call us as well  Addendum 08/18/13: Discussed with vascular tech on the phone for prelim report: no new DVT, resolving prior clots.  D-Dimer elevated to 3.77 Discussed results with Dr. Darnell Level. Will restart anticoagulation with coumadin at this time 5mg  daily until INR checks (initially with home health RN then will Dr. Elie Confer) daughter cannot bring to office until July due to work conflicts and transportation -home health RN order placed, to recheck INR Monday's -fall precautions reviewed with daughter today on the phone who in agreement with plan, caution for bleeding especially given also on plavix. Any signs of bleeding, advised to call pcp office right away -start coumadin 5mg  daily -discussed case with Dr. Elie Confer as well -pcp to assess removal of IVC

## 2013-08-15 NOTE — Assessment & Plan Note (Signed)
On lipitor. Also on lovaza but daughter claims very expensive and insurance may not cover it. Can she do otc fish oil instead? Last lipid panel 07/2011: LDL 91 and TG 69.   - I think it is reasonable to finish current full bottle of lovaza and then transition to otc fish oil when this current bottle is empty. Will defer to pcp if he wishes to change this recommendation. Consider rechecking lipid panel on follow up visit.  -continue statin

## 2013-08-15 NOTE — Assessment & Plan Note (Signed)
Daughter requesting hospital bed. Uses walker and wheelchair.

## 2013-08-15 NOTE — Assessment & Plan Note (Signed)
Needs to follow up with Dr. Wilhemina Bonito

## 2013-08-15 NOTE — Progress Notes (Signed)
Attending physician note: Patient examined together with resident physician Dr. Jerene Pitch. Left calf remains swollen but nontender now 2 months post acute extensive DVT on 06/10/13. The patient had a cava filter placed in view of bleeding post TURP which was the likely risk factor for the clot. He is still anemic. He has had progressive renal dysfunction. We do not have a clinical baseline. The degree of swelling is not excessive. Not painful. No evidence for compartment syndrome. There is a possibility that he has formed new clots and they may be obstructing the filter. Unfortunately there is no treatment for this. We are going to check a D-dimer. If elevated,  proceed with venous Doppler study. We had planned to do the Doppler study within the next 48 hours but there are insurance problems. If situation becomes urgent, the study will be done despite the insurance issues. He could be put back on anticoagulation if necessary now that he has stopped bleeding. Murriel Hopper, MD, [place]  Hematology-Oncology/Internal Medicine

## 2013-08-15 NOTE — Assessment & Plan Note (Signed)
BP Readings from Last 3 Encounters:  08/15/13 150/83  07/29/13 127/70  07/14/13 186/85   Lab Results  Component Value Date   NA 136* 07/10/2013   K 5.2 07/10/2013   CREATININE 2.17* 07/10/2013   Assessment: Blood pressure control: mildly elevated Progress toward BP goal:  deteriorated Comments: repeat bmet today  Plan: Medications:  continue current medications continue lopressor at this time 25mg  bid. Holding lotensin. Will defer to pcp to restart, but will get repeat cr to help monitor renal function and guide if to restart

## 2013-08-15 NOTE — Assessment & Plan Note (Signed)
Reports no obvious blood in urine per daughter. Last u/a: neg blood 4/29 u/a  -daughter needs to schedule urology appt with Dr. Wilhemina Bonito next

## 2013-08-16 LAB — BASIC METABOLIC PANEL WITH GFR
BUN: 18 mg/dL (ref 6–23)
CO2: 30 mEq/L (ref 19–32)
Calcium: 11 mg/dL — ABNORMAL HIGH (ref 8.4–10.5)
Chloride: 109 mEq/L (ref 96–112)
Creat: 1.02 mg/dL (ref 0.50–1.35)
GFR, Est African American: 77 mL/min
GFR, Est Non African American: 66 mL/min
Glucose, Bld: 101 mg/dL — ABNORMAL HIGH (ref 70–99)
Potassium: 4.4 mEq/L (ref 3.5–5.3)
Sodium: 147 mEq/L — ABNORMAL HIGH (ref 135–145)

## 2013-08-16 LAB — IRON AND TIBC
%SAT: 18 % — ABNORMAL LOW (ref 20–55)
Iron: 58 ug/dL (ref 42–165)
TIBC: 320 ug/dL (ref 215–435)
UIBC: 262 ug/dL (ref 125–400)

## 2013-08-16 LAB — FERRITIN: Ferritin: 61 ng/mL (ref 22–322)

## 2013-08-18 ENCOUNTER — Telehealth: Payer: Self-pay | Admitting: Licensed Clinical Social Worker

## 2013-08-18 ENCOUNTER — Other Ambulatory Visit (HOSPITAL_COMMUNITY): Payer: Self-pay | Admitting: Internal Medicine

## 2013-08-18 DIAGNOSIS — M25569 Pain in unspecified knee: Secondary | ICD-10-CM

## 2013-08-18 DIAGNOSIS — M7989 Other specified soft tissue disorders: Secondary | ICD-10-CM

## 2013-08-18 MED ORDER — WARFARIN SODIUM 5 MG PO TABS: 5.0000 mg | ORAL_TABLET | Freq: Every day | ORAL | Status: DC

## 2013-08-18 NOTE — Telephone Encounter (Signed)
Referral faxed

## 2013-08-18 NOTE — Addendum Note (Signed)
Addended by: Wilber Oliphant on: 08/18/2013 11:40 AM   Modules accepted: Orders

## 2013-08-18 NOTE — Addendum Note (Signed)
Addended by: Wilber Oliphant on: 08/18/2013 10:53 AM   Modules accepted: Orders

## 2013-08-18 NOTE — Telephone Encounter (Signed)
Pt's daughter reports Home health agency, [facility] informed pt of need for physician order home health services following SNF discharge.  Pt was d/c from SNF to [facility] services.  Order for [facility] PT/OT on chart.  CSW placed call to [facility] pt is current with agency and they are in need of physician order.  Discussed potential need for [facility] RN, [facility]mings able to provide.  CSW will fax orders once [facility] RN is decided.

## 2013-08-21 ENCOUNTER — Emergency Department (HOSPITAL_COMMUNITY): Payer: Medicare Other

## 2013-08-21 ENCOUNTER — Emergency Department (HOSPITAL_COMMUNITY)
Admission: EM | Admit: 2013-08-21 | Discharge: 2013-08-21 | Disposition: A | Discharge status: Home / Self Care | Payer: Medicare Other | Attending: Emergency Medicine | Admitting: Emergency Medicine

## 2013-08-21 ENCOUNTER — Encounter (HOSPITAL_COMMUNITY): Payer: Self-pay | Admitting: Emergency Medicine

## 2013-08-21 ENCOUNTER — Telehealth: Payer: Self-pay | Admitting: *Deleted

## 2013-08-21 DIAGNOSIS — R531 Weakness: Secondary | ICD-10-CM

## 2013-08-21 DIAGNOSIS — E785 Hyperlipidemia, unspecified: Secondary | ICD-10-CM | POA: Insufficient documentation

## 2013-08-21 DIAGNOSIS — Z9181 History of falling: Secondary | ICD-10-CM | POA: Insufficient documentation

## 2013-08-21 DIAGNOSIS — I129 Hypertensive chronic kidney disease with stage 1 through stage 4 chronic kidney disease, or unspecified chronic kidney disease: Secondary | ICD-10-CM | POA: Insufficient documentation

## 2013-08-21 DIAGNOSIS — Z7901 Long term (current) use of anticoagulants: Secondary | ICD-10-CM | POA: Insufficient documentation

## 2013-08-21 DIAGNOSIS — R5381 Other malaise: Secondary | ICD-10-CM | POA: Insufficient documentation

## 2013-08-21 DIAGNOSIS — H9190 Unspecified hearing loss, unspecified ear: Secondary | ICD-10-CM | POA: Insufficient documentation

## 2013-08-21 DIAGNOSIS — Z8673 Personal history of transient ischemic attack (TIA), and cerebral infarction without residual deficits: Secondary | ICD-10-CM | POA: Insufficient documentation

## 2013-08-21 DIAGNOSIS — I82409 Acute embolism and thrombosis of unspecified deep veins of unspecified lower extremity: Secondary | ICD-10-CM | POA: Insufficient documentation

## 2013-08-21 DIAGNOSIS — N182 Chronic kidney disease, stage 2 (mild): Secondary | ICD-10-CM | POA: Insufficient documentation

## 2013-08-21 DIAGNOSIS — D649 Anemia, unspecified: Secondary | ICD-10-CM | POA: Insufficient documentation

## 2013-08-21 DIAGNOSIS — D696 Thrombocytopenia, unspecified: Secondary | ICD-10-CM | POA: Insufficient documentation

## 2013-08-21 DIAGNOSIS — Z87891 Personal history of nicotine dependence: Secondary | ICD-10-CM | POA: Insufficient documentation

## 2013-08-21 DIAGNOSIS — Z87448 Personal history of other diseases of urinary system: Secondary | ICD-10-CM | POA: Insufficient documentation

## 2013-08-21 DIAGNOSIS — Z7902 Long term (current) use of antithrombotics/antiplatelets: Secondary | ICD-10-CM | POA: Insufficient documentation

## 2013-08-21 DIAGNOSIS — R5383 Other fatigue: Secondary | ICD-10-CM

## 2013-08-21 DIAGNOSIS — Z792 Long term (current) use of antibiotics: Secondary | ICD-10-CM | POA: Insufficient documentation

## 2013-08-21 DIAGNOSIS — Z79899 Other long term (current) drug therapy: Secondary | ICD-10-CM | POA: Insufficient documentation

## 2013-08-21 DIAGNOSIS — R0602 Shortness of breath: Secondary | ICD-10-CM | POA: Insufficient documentation

## 2013-08-21 LAB — COMPREHENSIVE METABOLIC PANEL
ALT: 8 U/L (ref 0–53)
AST: 19 U/L (ref 0–37)
Albumin: 3.4 g/dL — ABNORMAL LOW (ref 3.5–5.2)
Alkaline Phosphatase: 68 U/L (ref 39–117)
BUN: 25 mg/dL — ABNORMAL HIGH (ref 6–23)
CO2: 24 mEq/L (ref 19–32)
Calcium: 11.2 mg/dL — ABNORMAL HIGH (ref 8.4–10.5)
Chloride: 102 mEq/L (ref 96–112)
Creatinine, Ser: 1.28 mg/dL (ref 0.50–1.35)
GFR calc Af Amer: 57 mL/min — ABNORMAL LOW (ref >90)
GFR calc non Af Amer: 49 mL/min — ABNORMAL LOW (ref >90)
Glucose, Bld: 110 mg/dL — ABNORMAL HIGH (ref 70–99)
Potassium: 4.1 mEq/L (ref 3.7–5.3)
Sodium: 142 mEq/L (ref 137–147)
Total Bilirubin: 0.4 mg/dL (ref 0.3–1.2)
Total Protein: 7.5 g/dL (ref 6.0–8.3)

## 2013-08-21 LAB — CBC WITH DIFFERENTIAL/PLATELET
Basophils Absolute: 0 10*3/uL (ref 0.0–0.1)
Basophils Relative: 0 % (ref 0–1)
Eosinophils Absolute: 0.1 10*3/uL (ref 0.0–0.7)
Eosinophils Relative: 1 % (ref 0–5)
HCT: 32.9 % — ABNORMAL LOW (ref 39.0–52.0)
Hemoglobin: 10.6 g/dL — ABNORMAL LOW (ref 13.0–17.0)
Lymphocytes Relative: 14 % (ref 12–46)
Lymphs Abs: 1.2 10*3/uL (ref 0.7–4.0)
MCH: 30.3 pg (ref 26.0–34.0)
MCHC: 32.2 g/dL (ref 30.0–36.0)
MCV: 94 fL (ref 78.0–100.0)
Monocytes Absolute: 0.6 10*3/uL (ref 0.1–1.0)
Monocytes Relative: 7 % (ref 3–12)
Neutro Abs: 6.6 10*3/uL (ref 1.7–7.7)
Neutrophils Relative %: 78 % — ABNORMAL HIGH (ref 43–77)
Platelets: 131 10*3/uL — ABNORMAL LOW (ref 150–400)
RBC: 3.5 MIL/uL — ABNORMAL LOW (ref 4.22–5.81)
RDW: 15.6 % — ABNORMAL HIGH (ref 11.5–15.5)
WBC: 8.5 10*3/uL (ref 4.0–10.5)

## 2013-08-21 LAB — PROTIME-INR
INR: 1.14 (ref 0.00–1.49)
Prothrombin Time: 14.4 seconds (ref 11.6–15.2)

## 2013-08-21 MED ORDER — WARFARIN SODIUM 5 MG PO TABS: 5.0000 mg | ORAL_TABLET | Freq: Every day | ORAL | Status: DC

## 2013-08-21 MED ORDER — ENOXAPARIN SODIUM 100 MG/ML ~~LOC~~ SOLN
1.0000 mg/kg | Freq: Once | SUBCUTANEOUS | Status: AC
  Administered 2013-08-21: 75 mg via SUBCUTANEOUS
  Filled 2013-08-21: qty 1

## 2013-08-21 MED ORDER — ENOXAPARIN SODIUM 80 MG/0.8ML ~~LOC~~ SOLN: 1.0000 mg/kg | Freq: Two times a day (BID) | SUBCUTANEOUS | Status: DC

## 2013-08-21 MED ORDER — ENOXAPARIN SODIUM 80 MG/0.8ML ~~LOC~~ SOLN: 1.0000 mg/kg | SUBCUTANEOUS | Status: DC

## 2013-08-21 MED ORDER — WARFARIN SODIUM 5 MG PO TABS
5.0000 mg | ORAL_TABLET | Freq: Once | ORAL | Status: AC
  Administered 2013-08-21: 5 mg via ORAL
  Filled 2013-08-21: qty 1

## 2013-08-21 MED ORDER — WARFARIN - PHYSICIAN DOSING INPATIENT: Freq: Every day | Status: DC

## 2013-08-21 MED ORDER — IOHEXOL 350 MG/ML SOLN
100.0000 mL | Freq: Once | INTRAVENOUS | Status: AC | PRN
  Administered 2013-08-21: 100 mL via INTRAVENOUS

## 2013-08-21 NOTE — ED Notes (Signed)
Attempted to walk pt in the halls with assistance from another tech, pt took a few steps from the bed. Pt wasn't really supporting his own weight and stated "The back of my legs were hurting and i'm not able to stand for too long."

## 2013-08-21 NOTE — ED Provider Notes (Signed)
4:00 PM = Received sign-out from [facility]. Awaiting CT angio. Ambulate patient. Awaiting urine as well. Dispo home if everything normal.    Filed Vitals:   08/21/13 2215 08/21/13 2230 08/21/13 2245 08/21/13 2300  BP: 118/67 127/81 127/75 104/69  Pulse: 65 62 64   Temp:      Resp: 20 14 17 10   Height:      Weight:      SpO2: 97% 95% 96%     Results for orders placed during the hospital encounter of 08/21/13  PROTIME-INR      Result Value Ref Range   Prothrombin Time 14.4  11.6 - 15.2 seconds   INR 1.14  0.00 - 1.49  COMPREHENSIVE METABOLIC PANEL      Result Value Ref Range   Sodium 142  137 - 147 mEq/L   Potassium 4.1  3.7 - 5.3 mEq/L   Chloride 102  96 - 112 mEq/L   CO2 24  19 - 32 mEq/L   Glucose, Bld 110 (*) 70 - 99 mg/dL   BUN 25 (*) 6 - 23 mg/dL   Creatinine, Ser 1.28  0.50 - 1.35 mg/dL   Calcium 11.2 (*) 8.4 - 10.5 mg/dL   Total Protein 7.5  6.0 - 8.3 g/dL   Albumin 3.4 (*) 3.5 - 5.2 g/dL   AST 19  0 - 37 U/L   ALT 8  0 - 53 U/L   Alkaline Phosphatase 68  39 - 117 U/L   Total Bilirubin 0.4  0.3 - 1.2 mg/dL   GFR calc non Af Amer 49 (*) >90 mL/min   GFR calc Af Amer 57 (*) >90 mL/min  CBC WITH DIFFERENTIAL      Result Value Ref Range   WBC 8.5  4.0 - 10.5 K/uL   RBC 3.50 (*) 4.22 - 5.81 MIL/uL   Hemoglobin 10.6 (*) 13.0 - 17.0 g/dL   HCT 32.9 (*) 39.0 - 52.0 %   MCV 94.0  78.0 - 100.0 fL   MCH 30.3  26.0 - 34.0 pg   MCHC 32.2  30.0 - 36.0 g/dL   RDW 15.6 (*) 11.5 - 15.5 %   Platelets 131 (*) 150 - 400 K/uL   Neutrophils Relative % 78 (*) 43 - 77 %   Neutro Abs 6.6  1.7 - 7.7 K/uL   Lymphocytes Relative 14  12 - 46 %   Lymphs Abs 1.2  0.7 - 4.0 K/uL   Monocytes Relative 7  3 - 12 %   Monocytes Absolute 0.6  0.1 - 1.0 K/uL   Eosinophils Relative 1  0 - 5 %   Eosinophils Absolute 0.1  0.0 - 0.7 K/uL   Basophils Relative 0  0 - 1 %   Basophils Absolute 0.0  0.0 - 0.1 K/uL   CT Angio Chest PE W/Cm &/Or Wo Cm (Final result)  Result time: 08/21/13  17:59:50    Final result by Rad Results In Interface (08/21/13 17:59:50)    Narrative:   CLINICAL DATA: DOE, weakness, known dvt  EXAM: CT ANGIOGRAPHY CHEST WITH CONTRAST  TECHNIQUE: Multidetector CT imaging of the chest was performed using the standard protocol during bolus administration of intravenous contrast. Multiplanar CT image reconstructions and MIPs were obtained to evaluate the vascular anatomy.  CONTRAST: 167mL OMNIPAQUE IOHEXOL 350 MG/ML SOLN  COMPARISON: None.  FINDINGS: There are no filling defects in the pulmonary arterial tree to suggest acute pulmonary thromboembolism.  Maximal diameter of the ascending aorta is  4.4 cm. Left ventricle mild cardial hypertrophy is noted.  No abnormal mediastinal adenopathy.  No pneumothorax. No pleural effusion.  Clear lungs.  Multiple benign appearing cysts in the liver. Benign appearing cysts are partially imaged in the left kidney.  Nonspecific 1.8 x 2.7 cm left adrenal nodule.  Review of the MIP images confirms the above findings.  IMPRESSION: No evidence of acute pulmonary thromboembolism.  2.7 cm nonspecific left adrenal nodule. If the patient has high risk for malignancy or has a history of malignancy, MRI is recommended to further characterize.  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   Electronically Signed By: Maryclare Bean M.D. On: 08/21/2013 17:59             DG Chest Portable 1 View (Final result)  Result time: 08/21/13 15:42:57    Final result by Rad Results In Interface (08/21/13 15:42:57)    Narrative:   CLINICAL DATA: Weakness.  EXAM: PORTABLE CHEST - 1 VIEW  COMPARISON: 07/08/2013.  FINDINGS: The heart is borderline enlarged but stable. There is moderate tortuosity and ectasia of the thoracic aorta. The lungs are clear. No pleural effusion or pulmonary edema. The bony thorax is intact.  IMPRESSION: No acute cardiopulmonary findings.   Electronically  Signed By: Kalman Jewels M.D. On: 08/21/2013 15:42     Rechecks  3:00 PM = Patient refused UA and cath. Dr. Venora Maples ok with this per RN.  6:30 PM = Patient unable to ambulate due to pain in his calves. Refuses to ambulate due to this.  10:00 PM = Spoke with patient's daughter who is in the ED. Patient was getting the shots (Lovenox) in the nursing facility but was discharged on May 27th on plavix only. Warfarin prescription was called in last Friday 08/15/13 for "abnormal labs", but has not been picked up/filled. Patient is still taking the Plavix chronically for previous stroke. Daughter states she cannot administer the Lovenox at home and this is not covered by his insurance. She is upset and unsure why he is being started on the warfarin and Lovenox. Patient uses a walker at home at baseline. Strength 5/5 in the LE. No leg edema. Lungs clear to auscultation. No SOB or chest pain. Patient has slurred speech, which daughter states is also his baseline. Spoke with patient and daughter about admission. Patient refused. Daughter feels comfortable taking the patient home.   Consults  9:30 PM = Spoke with Dr. Nicole Kindred with neurology who states it is ok to stop Plavix and start Lovenox and warfarin.     Patient evaluated for LE weakness and SOB which occurred during physical therapy today. Patient has known DVT with IVC filter in place. Is currently on Plavix for previous stroke. Notes reviewed from previous providers which do indicate starting the patient back on warfarin. Reason for this is somewhat unclear. Patient has fall risk & IVC filter in place for known DVT of the left lower extremity. He had a repeat US of the left leg which shows no new DVT and resolving prior clots. Patient given Lovenox injection and 5 mg warfarin in the ED. Pharmacy contacted and also spoke with patient regarding this. Neurology consulted and is ok with stopping Plavix and starting warfarin and Lovenox. Patient's daughter  unsure about starting this new medication. Will call PCP tomorrow for further instruction. CT chest negative for PE today. SOB resolved throughout his ED visit. Chest x-ray negative for an acute cardiopulmonary process. EKG negative for any acute ischemic changes. Troponin negative (per  previous PA - not crossing over in lab system). Patient incidentally found to have an aneurysmal dilation of his aorta 4.4 cm and a non-specific left adrenal nodule. Patient and daughter informed of these results and instructed to follow-up with his PCP. Also has mild stable anemia which appears to be his baseline. Patient refusing to ambulate due to pain. Daughter states this is his baseline and is currently undergoing PT for this at home. She feels comfortable taking him home. Patient offered admission however he refused. Vital signs stable. Return precautions, discharge instructions, and follow-up was discussed with the patient before discharge.      Discharge Medication List as of 08/21/2013 11:00 PM    START taking these medications   Details  enoxaparin (LOVENOX) 80 MG/0.8ML injection Inject 0.75 mLs (75 mg total) into the skin every 12 (twelve) hours., Starting 08/21/2013, Last dose on Mon 08/25/13, Print    warfarin (COUMADIN) 5 MG tablet Take 1 tablet (5 mg total) by mouth daily., Starting 08/21/2013, Until Discontinued, Print        Final impressions: 1. SOB (shortness of breath)   2. DVT (deep venous thrombosis)   3. Weakness   4. Anemia   5. Thrombocytopenia       Mercy Moore PA-C   This patient was discussed with Dr. Huel Cote, PA-C 08/22/13 [phone]

## 2013-08-21 NOTE — ED Provider Notes (Signed)
Medical screening examination/treatment/procedure(s) were conducted as a shared visit with non-physician practitioner(s) and myself.  I personally evaluated the patient during the encounter.   EKG Interpretation   Date/Time:  Thursday August 21 2013 13:31:52 EDT Ventricular Rate:  68 PR Interval:  188 QRS Duration: 117 QT Interval:  423 QTC Calculation: 450 R Axis:   -21 Text Interpretation:  Sinus rhythm Nonspecific intraventricular conduction  delay No significant change was found Confirmed by Amera Banos  MD, Lennette Bihari  ([id number]) on 08/21/2013 4:22:52 PM      Patient will undergo CT angiogram to evaluate for pulmonary embolism given his dyspnea with exertion and history of DVT.  Patient will be dosed with Lovenox 1 mg per kilogram at this time.  Hoy Morn, MD 08/21/13 1700

## 2013-08-21 NOTE — ED Notes (Signed)
Patient experienced weakness of left leg, physical therapy at  Home with patient, this experienced twice today, left lower edematis, with past blood clots in leg, Stroke in 2013, speech and strength at this time are consistent with daily strength and speech.  Family wanted patient to come to ER to be evaluated for theses two episodes per EMS.

## 2013-08-21 NOTE — ED Provider Notes (Signed)
CSN: [id number]     Arrival date & time 08/21/13  1315 History   First MD Initiated Contact with Patient 08/21/13 1320     Chief Complaint  Patient presents with  . Weakness     (Consider location/radiation/quality/duration/timing/severity/associated sxs/prior Treatment) HPI Patient presents to ED for 2 episodes of bilateral lower extremity weakness that started at 12:00PM today.  Patient was walking with a walker and the help of his grandson when he developed sudden weakness and needed to sit in his wheelchair.  He was breathing heavily, and puffing on expiration.  EMS and home care PT was called.  He declined transport to ED, so PT tried to ambulate him 20 feet, when he had recurrence of symptoms.  Per daughter, patient had increased dyspnea, his "eyes rolled to the back of his head," and he would not respond to his name.  He was transported to ED at that time.  Since arrival he has seen resolution of symptoms, but still notes weakness.  He has a history of DVTs, but had a recent US of b/l lower legs which showed no blockages.  He is taking clopidogrel 75 mg daily.  No blood in stool, or urine.  Daughter is unsure of frequency, but notes some foul smelling urine 2 or 3 days ago.  No HA, or recent falls.  History of stroke in 2013.  No recent focal neurological deficits per daughter.    Patient is supposed to be taking Coumadin. RX given 2 days ago. He has not started. He had a repeat dvt study a few days ago per daughter that was negative.   Past Medical History  Diagnosis Date  . Hypertension   . Difficulty hearing, right     BILATERAL HEARING LOSS - BEST TO TRY TO SPEAK INTO LEFT EAR  . Meningioma   . Frequent falls   . BPH (benign prostatic hypertrophy)     TURP 05/19/13  . Chronic kidney disease     CHRONIC KIDNEY DISEASE, 2  . Anemia   . Thrombocytopenia   . Stroke     Cerebellar, 2013; WALKS WITH WALKER, ABLE TO DRESS AND BATHE HIMSELF BUT FAMILY TRIES TO PROVIDE SUPERVISION  BECAUSE OF HIS HX OF FALL AND WEAKNESS LEGS, ARMS   . Incontinence of urine     SOME INCONTINENCE  . Hyperlipidemia    Past Surgical History  Procedure Laterality Date  . Tube put in ear yrs ago    . Transurethral resection of prostate N/A 05/19/2013    Procedure: TRANSURETHRAL RESECTION OF THE PROSTATE WITH GYRUS INSTRUMENTS;  Surgeon: Ailene Rud, MD;  Location: [facility];  Service: Urology;  Laterality: N/A;  . Cystoscopy N/A 06/13/2013    Procedure: CYSTOSCOPY FLEXIBLE BEDSIDE;  Surgeon: Ardis Hughs, MD;  Location: [facility];  Service: Urology;  Laterality: N/A;   Family History  Problem Relation Age of Onset  . Diabetes Mother   . Heart disease Mother    History  Substance Use Topics  . Smoking status: Former Smoker    Types: Cigarettes  . Smokeless tobacco: Not on file  . Alcohol Use: No     Comment: QUIT SMOKING 50 YRS AGO    Review of Systems  Ten systems reviewed and are negative for acute change, except as noted in the HPI.    Allergies  Review of patient's allergies indicates no known allergies.  Home Medications   Prior to Admission medications   Medication Sig Start Date End Date Taking?  Authorizing Provider  atorvastatin (LIPITOR) 20 MG tablet Take 20 mg by mouth daily at 6 PM.    Historical Provider, MD  benazepril (LOTENSIN) 20 MG tablet  05/15/13   Historical Provider, MD  bisacodyl (DULCOLAX) 10 MG suppository Place 10 mg rectally daily as needed for moderate constipation.    Historical Provider, MD  Cholecalciferol (VITAMIN D PO) Take 1 tablet by mouth daily.    Historical Provider, MD  clopidogrel (PLAVIX) 75 MG tablet Take 75 mg by mouth daily with breakfast.    Historical Provider, MD  Elastic Bandages & Supports (JOBST KNEE HIGH COMPRESSION SM) MISC Intermediate, up to thigh, LLE DVT 08/15/13   Jerene Pitch, MD  Iron-Vitamins (GERITOL PO) Take 1 tablet by mouth daily.    Historical Provider, MD  magnesium hydroxide (MILK OF MAGNESIA) 800 MG/5ML  suspension Take 30 mLs by mouth daily as needed for constipation.    Historical Provider, MD  metoprolol tartrate (LOPRESSOR) 25 MG tablet Take 25 mg by mouth 2 (two) times daily.    Historical Provider, MD  omega-3 acid ethyl esters (LOVAZA) 1 G capsule Take 1 g by mouth daily.    Historical Provider, MD  warfarin (COUMADIN) 5 MG tablet Take 1 tablet (5 mg total) by mouth daily. 08/18/13 08/18/14  Jerene Pitch, MD   BP 126/79  Pulse 70  Temp(Src) 97.8 F (36.6 C)  Resp 22  SpO2 100% Physical Exam  Nursing note and vitals reviewed. Constitutional: He is oriented to person, place, and time. He appears well-developed and well-nourished. No distress.  HENT:  Head: Normocephalic and atraumatic.  Eyes: Conjunctivae are normal. No scleral icterus.  Neck: Normal range of motion. Neck supple.  Cardiovascular: Normal rate, regular rhythm and normal heart sounds.   Pulmonary/Chest: Effort normal and breath sounds normal. No respiratory distress.  BL peripheral edema, worse on the left   Abdominal: Soft. There is no tenderness.  Musculoskeletal: He exhibits no edema.  Neurological: He is alert and oriented to person, place, and time. He has normal reflexes. No cranial nerve deficit. Coordination normal.  Mild dysphasia. Able to speak and answer question   Equal strength BL.  folllows commands  Skin: Skin is warm and dry. He is not diaphoretic.  Psychiatric: His behavior is normal.    ED Course  Procedures (including critical care time) Labs Review Labs Reviewed - No data to display  Imaging Review No results found.   EKG Interpretation None      Date: 08/21/2013  Rate: 68  Rhythm: normal sinus rhythm  QRS Axis: normal  Intervals: normal  ST/T Wave abnormalities: normal  Conduction Disutrbances:nonspecific intraventricular conduction delay  Narrative Interpretation:   Old EKG Reviewed: improved    MDM   Final diagnoses:  None    3:27 PM BP 126/79  Pulse 70   Temp(Src) 97.8 F (36.6 C)  Resp 22  SpO2 100% Patient with episode of dyspnea on exertion and weakness place while walking today. This is new for him. Of the chart shows that patient had repeat CT study on the sixth which showed resolving clot burden however he continues to have a DVT. Question if the patient has a pulmonary embolus causing his symptoms today. Chest x-ray and CT imaging of the chest have been ordered. No new neurologic deficits on physical exam. Stable anemuia. Other  Labs pending.  3:51 PM BP 126/79  Pulse 70  Temp(Src) 97.8 F (36.6 C)  Resp 22  SpO2 100% Patient nad family updated on  clinical course, mdm and plan. Stable vs and in NAD. I have given report to PA Phineas Douglas who will assume care.  Margarita Mail, PA-C 08/21/13 1554

## 2013-08-21 NOTE — Telephone Encounter (Signed)
Sree with [facility] [phone] called clinic - unable to do PT today - just before PT came to home had period that pt was disoriented coming from [place]. Legs were weak - EMS was on their way and arrived while PT was there. States gait was ok, but legs were weak. BP 80/40. Note in Epic - pt was at ER. Hilda Blades Marianela Mandrell RN 08/21/13 3PM

## 2013-08-21 NOTE — ED Notes (Signed)
Patient transported to CT 

## 2013-08-21 NOTE — ED Notes (Signed)
Spoke with patient and now PA, patient and daughter  Are speaking with PA reguarding at home pharmaceutical regimen.

## 2013-08-21 NOTE — Discharge Instructions (Signed)
CT scan showed:  - left adrenal nodule. If the patient has high risk for malignancy or has a history of malignancy, MRI is recommended to further characterize. 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   * Please follow-up with your doctor regarding these results   - Please call tomorrow to follow-up with your doctor to determine the plan of anti-coagulation. Stop taking Plavix and start warfarin and lovenox unless otherwise told by your doctor  - Return to the emergency department if you develop any changing/worsening condition, weakness, fall, difficulty with ambulation, confusion, chest pain, difficulty breathing, or any other concerns (please read additional information regarding your condition below)       Deep Vein Thrombosis A deep vein thrombosis (DVT) is a blood clot that develops in the deep, larger veins of the leg, arm, or pelvis. These are more dangerous than clots that might form in veins near the surface of the body. A DVT can lead to complications if the clot breaks off and travels in the bloodstream to the lungs.  A DVT can damage the valves in your leg veins, so that instead of flowing upward, the blood pools in the lower leg. This is called post-thrombotic syndrome, and it can result in pain, swelling, discoloration, and sores on the leg. CAUSES Usually, several things contribute to blood clots forming. Contributing factors include:  The flow of blood slows down.  The inside of the vein is damaged in some way.  You have a condition that makes blood clot more easily. RISK FACTORS Some people are more likely than others to develop blood clots. Risk factors include:   Older age, especially over 5 years of age.  Having a family history of blood clots or if you have already had a blot clot.  Having major or lengthy surgery. This is especially true for surgery on the hip, knee, or belly (abdomen). Hip surgery is particularly high risk.  Breaking a hip or  leg.  Sitting or lying still for a long time. This includes long-distance travel, paralysis, or recovery from an illness or surgery.  Having cancer or cancer treatment.  Having a long, thin tube (catheter) placed inside a vein during a medical procedure.  Being overweight (obese).  Pregnancy and childbirth.  Hormone changes make the blood clot more easily during pregnancy.  The fetus puts pressure on the veins of the pelvis.  There is a risk of injury to veins during delivery or a caesarean. The risk is highest just after childbirth.  Medicines with the male hormone estrogen. This includes birth control pills and hormone replacement therapy.  Smoking.  Other circulation or heart problems.  SIGNS AND SYMPTOMS When a clot forms, it can either partially or totally block the blood flow in that vein. Symptoms of a DVT can include:  Swelling of the leg or arm, especially if one side is much worse.  Warmth and redness of the leg or arm, especially if one side is much worse.  Pain in an arm or leg. If the clot is in the leg, symptoms may be more noticeable or worse when standing or walking. The symptoms of a DVT that has traveled to the lungs (pulmonary embolism, PE) usually start suddenly and include:  Shortness of breath.  Coughing.  Coughing up blood or blood-tinged phlegm.  Chest pain. The chest pain is often worse with deep breaths.  Rapid heartbeat. Anyone with these symptoms should get emergency medical treatment right away. Call your local  emergency services (911 in the [place] if you have these symptoms. DIAGNOSIS If a DVT is suspected, your health care provider will take a full medical history and perform a physical exam. Tests that also may be required include:  Blood tests, including studies of the clotting properties of the blood.  Ultrasonography to see if you have clots in your legs or lungs.  X-rays to show the flow of blood when dye is injected into the  veins (venography).  Studies of your lungs if you have any chest symptoms. PREVENTION  Exercise the legs regularly. Take a brisk 30-minute walk every day.  Maintain a weight that is appropriate for your height.  Avoid sitting or lying in bed for long periods of time without moving your legs.  Women, particularly those over the age of 31 years, should consider the risks and benefits of taking estrogen medicines, including birth control pills.  Do not smoke, especially if you take estrogen medicines.  Long-distance travel can increase your risk of DVT. You should exercise your legs by walking or pumping the muscles every hour.  In-hospital prevention:  Many of the risk factors above relate to situations that exist with hospitalization, either for illness, injury, or elective surgery.  Your health care provider will assess you for the need for venous thromboembolism prophylaxis when you are admitted to the hospital. If you are having surgery, your surgeon will assess you the day of or day after surgery.  Prevention may include medical and nonmedical measures. TREATMENT Once identified, a DVT can be treated. It can also be prevented in some circumstances. Once you have had a DVT, you may be at increased risk for a DVT in the future. The most common treatment for DVT is blood thinning (anticoagulant) medicine, which reduces the blood's tendency to clot. Anticoagulants can stop new blood clots from forming and stop old ones from growing. They cannot dissolve existing clots. Your body does this by itself over time. Anticoagulants can be given by mouth, by IV access, or by injection. Your health care provider will determine the best program for you. Other medicines or treatments that may be used are:  Heparin or related medicines (low molecular weight heparin) are usually the first treatment for a blood clot. They act quickly. However, they cannot be taken orally.  Heparin can cause a fall in a  component of blood that stops bleeding and forms blood clots (platelets). You will be monitored with blood tests to be sure this does not occur.  Warfarin is an anticoagulant that can be swallowed. It takes a few days to start working, so usually heparin or related medicines are used in combination. Once warfarin is working, heparin is usually stopped.  Less commonly, clot dissolving drugs (thrombolytics) are used to dissolve a DVT. They carry a high risk of bleeding, so they are used mainly in severe cases, where your life or a limb is threatened.  Very rarely, a blood clot in the leg needs to be removed surgically.  If you are unable to take anticoagulants, your health care provider may arrange for you to have a filter placed in a main vein in your abdomen. This filter prevents clots from traveling to your lungs. HOME CARE INSTRUCTIONS  Take all medicines prescribed by your health care provider. Only take over-the-counter or prescription medicines for pain, fever, or discomfort as directed by your health care provider.  Warfarin. Most people will continue taking warfarin after hospital discharge. Your health care provider will advise  you on the length of treatment (usually 3 6 months, sometimes lifelong).  Too much and too little warfarin are both dangerous. Too much warfarin increases the risk of bleeding. Too little warfarin continues to allow the risk for blood clots. While taking warfarin, you will need to have regular blood tests to measure your blood clotting time. These blood tests usually include both the prothrombin time (PT) and international normalized ratio (INR) tests. The PT and INR results allow your health care provider to adjust your dose of warfarin. The dose can change for many reasons. It is critically important that you take warfarin exactly as prescribed, and that you have your PT and INR levels drawn exactly as directed.  Many foods, especially foods high in vitamin K, can  interfere with warfarin and affect the PT and INR results. Foods high in vitamin K include spinach, kale, broccoli, cabbage, collard and turnip greens, brussel sprouts, peas, cauliflower, seaweed, and parsley as well as beef and pork liver, green tea, and soybean oil. You should eat a consistent amount of foods high in vitamin K. Avoid major changes in your diet, or notify your health care provider before changing your diet. Arrange a visit with a dietitian to answer your questions.  Many medicines can interfere with warfarin and affect the PT and INR results. You must tell your health care provider about any and all medicines you take. This includes all vitamins and supplements. Be especially cautious with aspirin and anti-inflammatory medicines. Ask your health care provider before taking these. Do not take or discontinue any prescribed or over-the-counter medicine except on the advice of your health care provider or pharmacist.  Warfarin can have side effects, primarily excessive bruising or bleeding. You will need to hold pressure over cuts for longer than usual. Your health care provider or pharmacist will discuss other potential side effects.  Alcohol can change the body's ability to handle warfarin. It is best to avoid alcoholic drinks or consume only very small amounts while taking warfarin. Notify your health care provider if you change your alcohol intake.  Notify your dentist or other health care providers before procedures.  Activity. Ask your health care provider how soon you can go back to normal activities. It is important to stay active to prevent blood clots. If you are on anticoagulant medicine, avoid contact sports.  Exercise. It is very important to exercise. This is especially important while traveling, sitting, or standing for long periods of time. Exercise your legs by walking or by pumping the muscles frequently. Take frequent walks.  Compression stockings. These are tight  elastic stockings that apply pressure to the lower legs. This pressure can help keep the blood in the legs from clotting. You may need to wear compression stockings at home to help prevent a DVT.  Do not smoke. If you smoke, quit. Ask your health care provider for help with quitting smoking.  Learn as much as you can about DVT. Knowing more about the condition should help you keep it from coming back.  Wear a medical alert bracelet or carry a medical alert card. SEEK MEDICAL CARE IF:  You notice a rapid heartbeat.  You feel weaker or more tired than usual.  You feel faint.  You notice increased bruising.  You feel your symptoms are not getting better in the time expected.  You believe you are having side effects of medicine. SEEK IMMEDIATE MEDICAL CARE IF:  You have chest pain.  You have trouble breathing.  You  have new or increased swelling or pain in one leg.  You cough up blood.  You notice blood in vomit, in a bowel movement, or in urine. MAKE SURE YOU:  Understand these instructions.  Will watch your condition.  Will get help right away if you are not doing well or get worse. Document Released: 02/27/2005 Document Revised: 12/18/2012 Document Reviewed: 11/04/2012 [facility] Patient Information 2014 [place].  Shortness of Breath Shortness of breath means you have trouble breathing. Shortness of breath may indicate that you have a medical problem. You should seek immediate medical care for shortness of breath. CAUSES   Not enough oxygen in the air (as with high altitudes or a smoke-filled room).  Short-term (acute) lung disease, including:  Infections, such as pneumonia.  Fluid in the lungs, such as heart failure.  A blood clot in the lungs (pulmonary embolism).  Long-term (chronic) lung diseases.  Heart disease (heart attack, angina, heart failure, and others).  Low red blood cells (anemia).  Poor physical fitness. This can cause shortness of  breath when you exercise.  Chest or back injuries or stiffness.  Being overweight.  Smoking.  Anxiety. This can make you feel like you are not getting enough air. DIAGNOSIS  Serious medical problems can usually be found during your physical exam. Tests may also be done to determine why you are having shortness of breath. Tests may include:  Chest X-rays.  Lung function tests.  Blood tests.  Electrocardiography.  Exercise testing.  Echocardiography.  Imaging scans. Your caregiver may not be able to find a cause for your shortness of breath after your exam. In this case, it is important to have a follow-up exam with your caregiver as directed.  TREATMENT  Treatment for shortness of breath depends on the cause of your symptoms and can vary greatly. HOME CARE INSTRUCTIONS   Do not smoke. Smoking is a common cause of shortness of breath. If you smoke, ask for help to quit.  Avoid being around chemicals or things that may bother your breathing, such as paint fumes and dust.  Rest as needed. Slowly resume your usual activities.  If medicines were prescribed, take them as directed for the full length of time directed. This includes oxygen and any inhaled medicines.  Keep all follow-up appointments as directed by your caregiver. SEEK MEDICAL CARE IF:   Your condition does not improve in the time expected.  You have a hard time doing your normal activities even with rest.  You have any side effects or problems with the medicines prescribed.  You develop any new symptoms. SEEK IMMEDIATE MEDICAL CARE IF:   Your shortness of breath gets worse.  You feel lightheaded, faint, or develop a cough not controlled with medicines.  You start coughing up blood.  You have pain with breathing.  You have chest pain or pain in your arms, shoulders, or abdomen.  You have a fever.  You are unable to walk up stairs or exercise the way you normally do. MAKE SURE YOU:  Understand  these instructions.  Will watch your condition.  Will get help right away if you are not doing well or get worse. Document Released: 11/22/2000 Document Revised: 08/29/2011 Document Reviewed: 05/15/2011 [facility] Patient Information 2014 [place].  Anemia, Nonspecific Anemia is a condition in which the concentration of red blood cells or hemoglobin in the blood is below normal. Hemoglobin is a substance in red blood cells that carries oxygen to the tissues of the body. Anemia results  in not enough oxygen reaching these tissues.  CAUSES  Common causes of anemia include:   Excessive bleeding. Bleeding may be internal or external. This includes excessive bleeding from periods (in women) or from the intestine.   Poor nutrition.   Chronic kidney, thyroid, and liver disease.  Bone marrow disorders that decrease red blood cell production.  Cancer and treatments for cancer.  HIV, AIDS, and their treatments.  Spleen problems that increase red blood cell destruction.  Blood disorders.  Excess destruction of red blood cells due to infection, medicines, and autoimmune disorders. SIGNS AND SYMPTOMS   Minor weakness.   Dizziness.   Headache.  Palpitations.   Shortness of breath, especially with exercise.   Paleness.  Cold sensitivity.  Indigestion.  Nausea.  Difficulty sleeping.  Difficulty concentrating. Symptoms may occur suddenly or they may develop slowly.  DIAGNOSIS  Additional blood tests are often needed. These help your health care provider determine the best treatment. Your health care provider will check your stool for blood and look for other causes of blood loss.  TREATMENT  Treatment varies depending on the cause of the anemia. Treatment can include:   Supplements of iron, vitamin A35, or folic acid.   Hormone medicines.   A blood transfusion. This may be needed if blood loss is severe.   Hospitalization. This may be needed if there is  significant continual blood loss.   Dietary changes.  Spleen removal. HOME CARE INSTRUCTIONS Keep all follow-up appointments. It often takes many weeks to correct anemia, and having your health care provider check on your condition and your response to treatment is very important. SEEK IMMEDIATE MEDICAL CARE IF:   You develop extreme weakness, shortness of breath, or chest pain.   You become dizzy or have trouble concentrating.  You develop heavy vaginal bleeding.   You develop a rash.   You have bloody or black, tarry stools.   You faint.   You vomit up blood.   You vomit repeatedly.   You have abdominal pain.  You have a fever or persistent symptoms for more than 2 3 days.   You have a fever and your symptoms suddenly get worse.   You are dehydrated.  MAKE SURE YOU:  Understand these instructions.  Will watch your condition.  Will get help right away if you are not doing well or get worse. Document Released: 04/06/2004 Document Revised: 10/30/2012 Document Reviewed: 08/23/2012 [facility] Patient Information 2014 [place].  Information on my medicine - Coumadin   (Warfarin)  This medication education was reviewed with me or my healthcare representative as part of my discharge preparation.  The pharmacist that spoke with me during my hospital stay was:  Brain Hilts, [facility]  Why was Coumadin prescribed for you? Coumadin was prescribed for you because you have a blood clot or a medical condition that can cause an increased risk of forming blood clots. Blood clots can cause serious health problems by blocking the flow of blood to the heart, lung, or brain. Coumadin can prevent harmful blood clots from forming. As a reminder your indication for Coumadin is:   Deep Vein Thrombosis Treatment  What test will check on my response to Coumadin? While on Coumadin (warfarin) you will need to have an INR test regularly to ensure that your dose is keeping  you in the desired range. The INR (international normalized ratio) number is calculated from the result of the laboratory test called prothrombin time (PT).  If an INR APPOINTMENT  HAS NOT ALREADY BEEN MADE FOR YOU please schedule an appointment to have this lab work done by your health care provider within 7 days. Your INR goal is usually a number between:  2 to 3 or your provider may give you a more narrow range like 2-2.5.  Ask your health care provider during an office visit what your goal INR is.  What  do you need to  know  About  COUMADIN? Take Coumadin (warfarin) exactly as prescribed by your healthcare provider about the same time each day.  DO NOT stop taking without talking to the doctor who prescribed the medication.  Stopping without other blood clot prevention medication to take the place of Coumadin may increase your risk of developing a new clot or stroke.  Get refills before you run out.  What do you do if you miss a dose? If you miss a dose, take it as soon as you remember on the same day then continue your regularly scheduled regimen the next day.  Do not take two doses of Coumadin at the same time.  Important Safety Information A possible side effect of Coumadin (Warfarin) is an increased risk of bleeding. You should call your healthcare provider right away if you experience any of the following:   Bleeding from an injury or your nose that does not stop.   Unusual colored urine (red or dark brown) or unusual colored stools (red or black).   Unusual bruising for unknown reasons.   A serious fall or if you hit your head (even if there is no bleeding).  Some foods or medicines interact with Coumadin (warfarin) and might alter your response to warfarin. To help avoid this:   Eat a balanced diet, maintaining a consistent amount of Vitamin K.   Notify your provider about major diet changes you plan to make.   Avoid alcohol or limit your intake to 1 drink for women and 2 drinks for  men per day. (1 drink is 5 oz. wine, 12 oz. beer, or 1.5 oz. liquor.)  Make sure that ANY health care provider who prescribes medication for you knows that you are taking Coumadin (warfarin).  Also make sure the healthcare provider who is monitoring your Coumadin knows when you have started a new medication including herbals and non-prescription products.  Coumadin (Warfarin)  Major Drug Interactions  Increased Warfarin Effect Decreased Warfarin Effect  Alcohol (large quantities) Antibiotics (esp. Septra/Bactrim, Flagyl, Cipro) Amiodarone (Cordarone) Aspirin (ASA) Cimetidine (Tagamet) Megestrol (Megace) NSAIDs (ibuprofen, naproxen, etc.) Piroxicam (Feldene) Propafenone (Rythmol SR) Propranolol (Inderal) Isoniazid (INH) Posaconazole (Noxafil) Barbiturates (Phenobarbital) Carbamazepine (Tegretol) Chlordiazepoxide (Librium) Cholestyramine (Questran) Griseofulvin Oral Contraceptives Rifampin Sucralfate (Carafate) Vitamin K   Coumadin (Warfarin) Major Herbal Interactions  Increased Warfarin Effect Decreased Warfarin Effect  Garlic Ginseng Ginkgo biloba Coenzyme Q10 Green tea St. Johns wort    Coumadin (Warfarin) FOOD Interactions  Eat a consistent number of servings per week of foods HIGH in Vitamin K (1 serving =  cup)  Collards (cooked, or boiled & drained) Kale (cooked, or boiled & drained) Mustard greens (cooked, or boiled & drained) Parsley *serving size only =  cup Spinach (cooked, or boiled & drained) Swiss chard (cooked, or boiled & drained) Turnip greens (cooked, or boiled & drained)  Eat a consistent number of servings per week of foods MEDIUM-HIGH in Vitamin K (1 serving = 1 cup)  Asparagus (cooked, or boiled & drained) Broccoli (cooked, boiled & drained, or raw & chopped) Brussel sprouts (cooked, or boiled &  drained) *serving size only =  cup Lettuce, raw (green leaf, endive, romaine) Spinach, raw Turnip greens, raw & chopped   These websites have  more information on Coumadin (warfarin):  FailFactory.se; VeganReport.com.au;

## 2013-08-21 NOTE — ED Notes (Signed)
PATIENT ASKED TO PROVIDE URINE SAMPLE, PT STATES HE DOES NOT NEED TO PROVIDE ONE AT THIS TIME AND REFUSES AN IN AND OUT CATHETER.

## 2013-08-22 ENCOUNTER — Telehealth: Payer: Self-pay | Admitting: *Deleted

## 2013-08-22 NOTE — Telephone Encounter (Signed)
[name] [phone] called - Sharyn Lull the daughter of pt wants to talk to a doctor about meds - did not start Coumadin on 08/18/13 and family is refusing to learn or give Lovenox. INR was to be done today by [facility]. Pt is taking Plavix.  Talked with Dr Elie Confer and Dr Eula Fried about pt - suggest to call PCP Dr Heber [place]. Dr Heber [place] is aware - states will call daughter and [place] at [place]. Hilda Blades Alontae Chaloux RN 08/22/13 2:40PM

## 2013-08-22 NOTE — Telephone Encounter (Signed)
[employer] (Daughter).  She had some concerns about warfarin + lovenox + plavix.  We discussed the reasons for the medications and I instructed that I did not think he needed to be bridged with Lovenox until therapeutic on Warfarin since his LE Dopper showed no acute DVT and CTA was neg for PE.  She will start Warfarin 5mg  today, and continue Plavix.   I also spoke with Nigel Mormon [facility] @ [phone]) who came out to take INR today.  As patient had not started warfarin she did not take INR.  I relayed my conversation and she will return for INR recheck on Tuesday 6/16.  She will fax these results to Dr Elie Confer.

## 2013-08-22 NOTE — Telephone Encounter (Signed)
[employer] (Daughter). She had some concerns about warfarin + lovenox + plavix. We discussed the reasons for the medications and I instructed that I did not think he needed to be bridged with Lovenox until therapeutic on Warfarin since his LE Dopper showed no acute DVT and CTA was neg for PE. She will start Warfarin 5mg  today, and continue Plavix.  I also spoke with Nigel Mormon [facility] @ [phone]) who came out to take INR today. As patient had not started warfarin she did not take INR. I relayed my conversation and she will return for INR recheck on Tuesday 6/16. She will fax these results to Dr Elie Confer.

## 2013-08-23 NOTE — ED Provider Notes (Signed)
Medical screening examination/treatment/procedure(s) were performed by non-physician practitioner and as supervising physician I was immediately available for consultation/collaboration.   EKG Interpretation   Date/Time:  Thursday August 21 2013 13:31:52 EDT Ventricular Rate:  68 PR Interval:  188 QRS Duration: 117 QT Interval:  423 QTC Calculation: 450 R Axis:   -21 Text Interpretation:  Sinus rhythm Nonspecific intraventricular conduction  delay No significant change was found Confirmed by CAMPOS  MD, Lennette Bihari  ([id number]) on 08/21/2013 4:22:52 PM        Fredia Sorrow, MD 08/23/13 [id number]

## 2013-08-25 NOTE — Addendum Note (Signed)
Addended by: Wilber Oliphant on: 08/25/2013 03:20 PM   Modules accepted: Orders

## 2013-08-26 ENCOUNTER — Telehealth: Payer: Self-pay | Admitting: *Deleted

## 2013-08-26 ENCOUNTER — Telehealth: Payer: Self-pay | Admitting: Pharmacist

## 2013-08-26 NOTE — Telephone Encounter (Signed)
[name] with [facility] called [phone] about Coumadin and INR problem - according to Epic -  [name] has spoken th Dr Elie Confer today about this problem. Message of Dr Elie Confer was forward to Dr Heber Superior. [name] RN 08/26/13 2:15PM

## 2013-08-26 NOTE — Telephone Encounter (Signed)
Called and spoke with [facility], she was concerned about giving Coumadin and Plavix together (spoke to her Pharmacist and he advised that the risk of bleeding was too high on both of these medications.) I advised that there is a risk of bleeding but these medications work in different ways.  She reports she gave him the first dose of coumadin today, She will resume Plavix for him.  They will continue Coumadin.  Joni Reining, DO 3:19 PM

## 2013-08-26 NOTE — Telephone Encounter (Signed)
[facility] RN Jerene Pitch called indicating she was at the home of patient only to discover that the patient has NOT commenced warfarin as was advised this past Friday. She will reinforce the requirement of warfarin therapy for DVT. Commence warfarin today, collect INR in six days (Monday 22-JUN-15) results to be provided to me.

## 2013-09-01 ENCOUNTER — Telehealth: Payer: Self-pay | Admitting: Pharmacist

## 2013-09-01 NOTE — Telephone Encounter (Signed)
[facility]HA RN from [place] at [phone] texts indicating INR = 2.7 on 5mg  warfarin by mouth daily. No bleeding complications noted. No embolic complications noted. [facility] RN was advised to instruct patient to continue taking warfarin 5mg  PO daily and to recollect INR next Monday 29-JUN-15 and communicate results to me for interpretation and change in dose if necessary.

## 2013-09-09 ENCOUNTER — Telehealth: Payer: Self-pay | Admitting: Pharmacist

## 2013-09-09 NOTE — Telephone Encounter (Signed)
Spoke with daughter [name]. She states that he missed only ONE dose (day before PT/INR). She also states that he has been eating more dark green leafy vegetables. This could account for the decrease in INR. She voiced concern regarding the dietary restrictions and asked specifically was there any other option for her father. We discussed rivaroxaban/Xarelto--to include the issue that in the package insert she would see "There is no known pharmacologic reversal of rivaroxaban". We discussed the healthcare system's management strategy of such patients that would see a 4-Factor Prothrombin Concentrate-non-activated (Kcentra) administered as well as supportive care (e.g., IVF's, evaluation for source of bleeding, etc.) She is interested in having further discussion(s) with the PCP. Patient has MEDICARE Part D.  She also mentioned that the affected lower extremity is more swollen. I indicated to her to call the Triage Nurse and reconcile with her schedule and any available slots--in which the patient may be seen in [facility] for evaluation of extremity as well as further discussion regarding the role of a target specific oral anticoagulant.

## 2013-09-09 NOTE — Telephone Encounter (Signed)
[facility] RN [place] texted INR and we subsequently spoke by phone: INR = 1.2 repeated with same results. States missed ONE dose only (day before collection). Will increase to 7.5mg  T/Th/Sa; 5mg  all other days. Recollect in 6 days (6-JUL-15) and result to me.

## 2013-09-09 NOTE — Telephone Encounter (Signed)
I talked with Dr Marinda Elk and he advised pt being seen today. I call daughter back and advised OV today but she is not able to get pt in.  She will bring him in tomorrow. She will go to ED if any changes or concerns, before visit on 7/1. I expressed Dr Marinda Elk concerns to her and that it would be best for pt to be seen today.  She understands but not able to get him to clinic.

## 2013-09-09 NOTE — Telephone Encounter (Signed)
Pt scheduled for office visit on 7/2 in clinic to evaluate bil LE's Daughter will bring pt.

## 2013-09-09 NOTE — Telephone Encounter (Signed)
Noted, patient is to see Dr. Eyvonne Mechanic, I will forward this message on to him to have discussion tomorrow about alternative treatment with NOAC.

## 2013-09-10 ENCOUNTER — Encounter: Payer: Self-pay | Admitting: Internal Medicine

## 2013-09-10 ENCOUNTER — Ambulatory Visit (INDEPENDENT_AMBULATORY_CARE_PROVIDER_SITE_OTHER): Payer: Medicare Other | Admitting: Internal Medicine

## 2013-09-10 VITALS — BP 148/84 | HR 77 | Temp 97.8°F | Ht 71.0 in | Wt 161.8 lb

## 2013-09-10 DIAGNOSIS — E785 Hyperlipidemia, unspecified: Secondary | ICD-10-CM

## 2013-09-10 DIAGNOSIS — E278 Other specified disorders of adrenal gland: Secondary | ICD-10-CM

## 2013-09-10 DIAGNOSIS — R600 Localized edema: Secondary | ICD-10-CM

## 2013-09-10 DIAGNOSIS — I129 Hypertensive chronic kidney disease with stage 1 through stage 4 chronic kidney disease, or unspecified chronic kidney disease: Secondary | ICD-10-CM

## 2013-09-10 DIAGNOSIS — I82409 Acute embolism and thrombosis of unspecified deep veins of unspecified lower extremity: Secondary | ICD-10-CM

## 2013-09-10 DIAGNOSIS — N183 Chronic kidney disease, stage 3 unspecified: Secondary | ICD-10-CM

## 2013-09-10 DIAGNOSIS — E279 Disorder of adrenal gland, unspecified: Secondary | ICD-10-CM

## 2013-09-10 DIAGNOSIS — R609 Edema, unspecified: Secondary | ICD-10-CM

## 2013-09-10 DIAGNOSIS — I82509 Chronic embolism and thrombosis of unspecified deep veins of unspecified lower extremity: Secondary | ICD-10-CM

## 2013-09-10 LAB — COMPLETE METABOLIC PANEL WITH GFR
ALT: 9 U/L (ref 0–53)
AST: 16 U/L (ref 0–37)
Albumin: 3.1 g/dL — ABNORMAL LOW (ref 3.5–5.2)
Alkaline Phosphatase: 67 U/L (ref 39–117)
BUN: 15 mg/dL (ref 6–23)
CO2: 25 mEq/L (ref 19–32)
Calcium: 9.9 mg/dL (ref 8.4–10.5)
Chloride: 106 mEq/L (ref 96–112)
Creat: 1.06 mg/dL (ref 0.50–1.35)
GFR, Est African American: 73 mL/min
GFR, Est Non African American: 63 mL/min
Glucose, Bld: 91 mg/dL (ref 70–99)
Potassium: 4.3 mEq/L (ref 3.5–5.3)
Sodium: 143 mEq/L (ref 135–145)
Total Bilirubin: 0.3 mg/dL (ref 0.2–1.2)
Total Protein: 6.6 g/dL (ref 6.0–8.3)

## 2013-09-10 LAB — POCT INR: INR: 1.3

## 2013-09-10 MED ORDER — FUROSEMIDE 20 MG PO TABS: 20.0000 mg | ORAL_TABLET | Freq: Every day | ORAL | Status: DC

## 2013-09-10 MED ORDER — RIVAROXABAN 15 MG PO TABS: 15.0000 mg | ORAL_TABLET | Freq: Two times a day (BID) | ORAL | Status: DC

## 2013-09-10 MED ORDER — RIVAROXABAN 20 MG PO TABS: 20.0000 mg | ORAL_TABLET | Freq: Every day | ORAL | Status: DC

## 2013-09-10 NOTE — Patient Instructions (Signed)
Please take Xarelto 15 mg 2x per day for 21 days then 20 mg daily  Please come back in 2 weeks for follow up and lab checks  Please get MRI of your abdomen/pelvis and Ultrasound of your legs tomorrow.  Please take Lasix 20 mg in the mornings   Rivaroxaban oral tablets What is this medicine? RIVAROXABAN (ri va ROX a ban) is an anticoagulant (blood thinner). It is used to treat blood clots in the lungs or in the veins. It is also used after knee or hip surgeries to prevent blood clots. It is also used to lower the chance of stroke in people with a medical condition called atrial fibrillation. This medicine may be used for other purposes; ask your health care provider or pharmacist if you have questions. COMMON BRAND NAME(S): Xarelto, Xarelto Starter Pack What should I tell my health care provider before I take this medicine? They need to know if you have any of these conditions: -bleeding disorders -bleeding in the brain -blood in your stools (black or tarry stools) or if you have blood in your vomit -history of stomach bleeding -kidney disease -liver disease -low blood counts, like low white cell, platelet, or red cell counts -recent or planned spinal or epidural procedure -take medicines that treat or prevent blood clots -an unusual or allergic reaction to rivaroxaban, other medicines, foods, dyes, or preservatives -pregnant or trying to get pregnant -breast-feeding How should I use this medicine? Take this medicine by mouth with a glass of water. Follow the directions on the prescription label. Take your medicine at regular intervals. Do not take it more often than directed. Do not stop taking except on your doctor's advice. Stopping this medicine may increase your risk of a blot clot. Be sure to refill your prescription before you run out of medicine. If you are taking this medicine after hip or knee replacement surgery, take it with or without food. If you are taking this medicine for  atrial fibrillation, take it with your evening meal. If you are taking this medicine to treat blood clots, take it with food at the same time each day. If you are unable to swallow your tablet, you may crush the tablet and mix it in applesauce. Then, immediately eat the applesauce. You should eat more food right after you eat the applesauce containing the crushed tablet. Talk to your pediatrician regarding the use of this medicine in children. Special care may be needed. Overdosage: If you think you have taken too much of this medicine contact a poison control center or emergency room at once. NOTE: This medicine is only for you. Do not share this medicine with others. What if I miss a dose? If you take your medicine once a day and miss a dose, take the missed dose as soon as you remember. If you take your medicine twice a day and miss a dose, take the missed dose immediately. In this instance, 2 tablets may be taken at the same time. The next day you should take 1 tablet twice a day as directed. What may interact with this medicine? -aspirin and aspirin-like medicines -certain antibiotics like erythromycin, azithromycin, and clarithromycin -certain medicines for fungal infections like ketoconazole and itraconazole -certain medicines for irregular heart beat like amiodarone, quinidine, dronedarone -certain medicines for seizures like carbamazepine, phenytoin -certain medicines that treat or prevent blood clots like warfarin, enoxaparin, and dalteparin -conivaptan -diltiazem -felodipine -indinavir -lopinavir; ritonavir -NSAIDS, medicines for pain and inflammation, like ibuprofen or naproxen -ranolazine -rifampin -  ritonavir -St. John's wort -verapamil This list may not describe all possible interactions. Give your health care provider a list of all the medicines, herbs, non-prescription drugs, or dietary supplements you use. Also tell them if you smoke, drink alcohol, or use illegal drugs. Some  items may interact with your medicine. What should I watch for while using this medicine? Visit your doctor or health care professional for regular checks on your progress. Your condition will be monitored carefully while you are receiving this medicine. Notify your doctor or health care professional and seek emergency treatment if you develop breathing problems; changes in vision; chest pain; severe, sudden headache; pain, swelling, warmth in the leg; trouble speaking; sudden numbness or weakness of the face, arm, or leg. These can be signs that your condition has gotten worse. If you are going to have surgery, tell your doctor or health care professional that you are taking this medicine. Tell your health care professional that you use this medicine before you have a spinal or epidural procedure. Sometimes people who take this medicine have bleeding problems around the spine when they have a spinal or epidural procedure. This bleeding is very rare. If you have a spinal or epidural procedure while on this medicine, call your health care professional immediately if you have back pain, numbness or tingling (especially in your legs and feet), muscle weakness, paralysis, or loss of bladder or bowel control. Avoid sports and activities that might cause injury while you are using this medicine. Severe falls or injuries can cause unseen bleeding. Be careful when using sharp tools or knives. Consider using an Copy. Take special care brushing or flossing your teeth. Report any injuries, bruising, or red spots on the skin to your doctor or health care professional. What side effects may I notice from receiving this medicine? Side effects that you should report to your doctor or health care professional as soon as possible: -allergic reactions like skin rash, itching or hives, swelling of the face, lips, or tongue -back pain -redness, blistering, peeling or loosening of the skin, including inside the  mouth -signs and symptoms of bleeding such as bloody or black, tarry stools; red or dark-brown urine; spitting up blood or brown material that looks like coffee grounds; red spots on the skin; unusual bruising or bleeding from the eye, gums, or nose Side effects that usually do not require medical attention (Report these to your doctor or health care professional if they continue or are bothersome.): -dizziness -muscle pain This list may not describe all possible side effects. Call your doctor for medical advice about side effects. You may report side effects to FDA at 1-800-FDA-1088. Where should I keep my medicine? Keep out of the reach of children. Store at room temperature between 15 and 30 degrees C (59 and 86 degrees F). Throw away any unused medicine after the expiration date. NOTE: This sheet is a summary. It may not cover all possible information. If you have questions about this medicine, talk to your doctor, pharmacist, or health care provider.  2015, Elsevier/Gold Standard. (2012-08-14 09:51:31)  Furosemide tablets What is this medicine? FUROSEMIDE (fyoor [place] se mide) is a diuretic. It helps you make more urine and to lose salt and excess water from your body. This medicine is used to treat high blood pressure, and edema or swelling from heart, kidney, or liver disease. This medicine may be used for other purposes; ask your health care provider or pharmacist if you have questions. COMMON BRAND NAME(S):  Delone, Lasix What should I tell my health care provider before I take this medicine? They need to know if you have any of these conditions: -abnormal blood electrolytes -diarrhea or vomiting -gout -heart disease -kidney disease, small amounts of urine, or difficulty passing urine -liver disease -an unusual or allergic reaction to furosemide, sulfa drugs, other medicines, foods, dyes, or preservatives -pregnant or trying to get pregnant -breast-feeding How should I use this  medicine? Take this medicine by mouth with a glass of water. Follow the directions on the prescription label. You may take this medicine with or without food. If it upsets your stomach, take it with food or milk. Do not take your medicine more often than directed. Remember that you will need to pass more urine after taking this medicine. Do not take your medicine at a time of day that will cause you problems. Do not take at bedtime. Talk to your pediatrician regarding the use of this medicine in children. While this drug may be prescribed for selected conditions, precautions do apply. Overdosage: If you think you have taken too much of this medicine contact a poison control center or emergency room at once. NOTE: This medicine is only for you. Do not share this medicine with others. What if I miss a dose? If you miss a dose, take it as soon as you can. If it is almost time for your next dose, take only that dose. Do not take double or extra doses. What may interact with this medicine? -aspirin and aspirin-like medicines -certain antibiotics -chloral hydrate -cisplatin -cyclosporine -digoxin -diuretics -laxatives -lithium -medicines for blood pressure -medicines that relax muscles for surgery -methotrexate -NSAIDs, medicines for pain and inflammation like ibuprofen, naproxen, or indomethacin -phenytoin -steroid medicines like prednisone or cortisone -sucralfate This list may not describe all possible interactions. Give your health care provider a list of all the medicines, herbs, non-prescription drugs, or dietary supplements you use. Also tell them if you smoke, drink alcohol, or use illegal drugs. Some items may interact with your medicine. What should I watch for while using this medicine? Visit your doctor or health care professional for regular checks on your progress. Check your blood pressure regularly. Ask your doctor or health care professional what your blood pressure should be,  and when you should contact him or her. If you are a diabetic, check your blood sugar as directed. You may need to be on a special diet while taking this medicine. Check with your doctor. Also, ask how many glasses of fluid you need to drink a day. You must not get dehydrated. You may get drowsy or dizzy. Do not drive, use machinery, or do anything that needs mental alertness until you know how this drug affects you. Do not stand or sit up quickly, especially if you are an older patient. This reduces the risk of dizzy or fainting spells. Alcohol can make you more drowsy and dizzy. Avoid alcoholic drinks. This medicine can make you more sensitive to the sun. Keep out of the sun. If you cannot avoid being in the sun, wear protective clothing and use sunscreen. Do not use sun lamps or tanning beds/booths. What side effects may I notice from receiving this medicine? Side effects that you should report to your doctor or health care professional as soon as possible: -blood in urine or stools -dry mouth -fever or chills -hearing loss or ringing in the ears -irregular heartbeat -muscle pain or weakness, cramps -skin rash -stomach upset, pain, or nausea -tingling  or numbness in the hands or feet -unusually weak or tired -vomiting or diarrhea -yellowing of the eyes or skin Side effects that usually do not require medical attention (report to your doctor or health care professional if they continue or are bothersome): -headache -loss of appetite -unusual bleeding or bruising This list may not describe all possible side effects. Call your doctor for medical advice about side effects. You may report side effects to FDA at 1-800-FDA-1088. Where should I keep my medicine? Keep out of the reach of children. Store at room temperature between 15 and 30 degrees C (59 and 86 degrees F). Protect from light. Throw away any unused medicine after the expiration date. NOTE: This sheet is a summary. It may not cover  all possible information. If you have questions about this medicine, talk to your doctor, pharmacist, or health care provider.  2015, Elsevier/Gold Standard. (2009-02-15 16:24:50)

## 2013-09-11 ENCOUNTER — Encounter: Payer: Self-pay | Admitting: Internal Medicine

## 2013-09-11 ENCOUNTER — Ambulatory Visit: Payer: Medicare Other | Admitting: Internal Medicine

## 2013-09-11 ENCOUNTER — Telehealth: Payer: Self-pay | Admitting: *Deleted

## 2013-09-11 ENCOUNTER — Encounter (HOSPITAL_COMMUNITY): Payer: Medicare Other

## 2013-09-11 DIAGNOSIS — E278 Other specified disorders of adrenal gland: Secondary | ICD-10-CM

## 2013-09-11 DIAGNOSIS — R600 Localized edema: Secondary | ICD-10-CM | POA: Insufficient documentation

## 2013-09-11 HISTORY — DX: Other specified disorders of adrenal gland: E27.8

## 2013-09-11 NOTE — Assessment & Plan Note (Signed)
Due to INR being 1.3 and b/l lower ext swelling R>L on exam will repeat Dopplers lower ext b/l Will change Coumadin to Xarelto (since INR variable and hard to control) 15 mg bid x 21 days then 20 mg daily as daughter states INR is difficult to control with diet Disc'ed side effects of Xarelto with the daughter

## 2013-09-11 NOTE — Telephone Encounter (Signed)
Pt's daughter had told Manufacturing engineer, RN, that she preferred to wait for pt's MRI and doppler on the same day and only dopplers could be done today. Therefore, first available day that both tests could be done at approximately the same time was Monday, July 6 - MRI scheduled for 12 noon and dopplers at 2P - Pt's dtr states she will come to Admitting at approx. 11:30 AM on 09/15/13 to register for dopplers and make sure insurance does not require any further paperwork before MRI, then will take pt to MRI at 12 and dopplers at 2P. Yvonna Alanis, RN, 09/11/13, 3:33 PM

## 2013-09-11 NOTE — Assessment & Plan Note (Signed)
BP Readings from Last 3 Encounters:  09/10/13 148/84  08/21/13 104/69  08/15/13 150/83    Lab Results  Component Value Date   NA 143 09/10/2013   K 4.3 09/10/2013   CREATININE 1.06 09/10/2013    Assessment: Blood pressure control: controlled Progress toward BP goal:  at goal Comments: none  Plan: Medications:  continue current medications (Lopressor 25 mg bid) Other plans: CMET checked today. F/U with PCP

## 2013-09-11 NOTE — Assessment & Plan Note (Signed)
CC today though RLE>LLE Ddx: r/o potential IVC obstruction due to 2.7 cm adrenal mass, r/o DVT since INR 1.3 today, worsening renal function (CMET wnl), low Albumin 3.1.  Will repeat Doppler b/l lower ext, will MRI ab/pelvis to w/u for adrenal mass, CMET checked today Will start on Lasix 20 mg qd to see if helps leg edema and cont. Leg elevation.  No evidence of skin breakdown as of yet F/u in 2 weeks

## 2013-09-11 NOTE — Assessment & Plan Note (Signed)
CKD 3 with recent A/C likely 2/2 to urinary retention due to BPH s/p TURP Will check CMET today to make sure worsening renal function not contributing to leg edema b/l

## 2013-09-11 NOTE — Progress Notes (Signed)
   Subjective:    Patient ID: [name], male    DOB: 05-23-1924, 80 y.o.   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   He presents for:  1) 2 weeks or more of b/l leg swelling.  His daughter is w/ him at visit and states she has tried leg elevation without relief.  Pain denies pain in legs and recently had DVT 05/2013 in left leg and is on Coumadin but INR variable (too high or too low).  INR today is 1.3 on 08/15/13 INR was 3.77.  He has swelling from lower legs up to his thighs and the daughter notices that his diapers fit tighter.  Patient denies swelling in his groin.   2) h/o DVT-due to difficult to control INR and having to watch diet, pt's daughter (who will be HCPOA) interested in different tx for DVT. Discussed previously with Dr. Elie Confer on 6/30 of Xarelto. Recent doppler in ED 6/11 and CT chest neg for DVT and PE but showed 2.7 cm left adrenal nodule with rec. Of f/u with MRI to r/o malignancy.      3) HTN-BP controlled today 148/84.                                     Review of Systems  Constitutional:       Weight stable 161 on 6/11 wt was 164   Respiratory: Negative for shortness of breath.   Cardiovascular: Positive for leg swelling. Negative for chest pain.  Genitourinary: Negative for scrotal swelling and difficulty urinating.  Musculoskeletal: Negative for arthralgias.       Objective:   Physical Exam  Nursing note and vitals reviewed. Constitutional: He is oriented to person, place, and time. Vital signs are normal. He appears well-developed and well-nourished. He is cooperative.  HOH  HENT:  Head: Normocephalic and  atraumatic.  Right Ear: Decreased hearing is noted.  Left Ear: Decreased hearing is noted.  Mouth/Throat: Oropharynx is clear and moist and mucous membranes are normal. No oropharyngeal exudate.  Eyes: Conjunctivae are normal. Right eye exhibits no discharge. Left eye exhibits no discharge. No scleral icterus.  Cardiovascular: Normal rate, regular rhythm, S1 normal, S2 normal and normal heart sounds.   No murmur heard. 2+ edema b/l lower ext with legs feeling tight edema extends up to thighs b/l R>L thigh but is not pitting  Pulmonary/Chest: Effort normal and breath sounds normal.  Abdominal: Soft. Bowel sounds are normal. He exhibits no distension. There is no tenderness.  Musculoskeletal: He exhibits no edema.  Neurological: He is alert and oriented to person, place, and time.  In wheelchair today  Skin: Skin is warm, dry and intact. No rash noted.  Psychiatric: He has a normal mood and affect. His speech is normal and behavior is normal. Judgment and thought content normal. Cognition and memory are normal.          Assessment & Plan:  F/u in 2 weeks

## 2013-09-11 NOTE — Progress Notes (Signed)
INTERNAL MEDICINE TEACHING ATTENDING ADDENDUM - Aldine Contes, MD: I personally saw and evaluated Mr. Spadafore in this clinic visit in conjunction with the resident, Dr. Aundra Dubin. I have discussed patient's plan of care with medical resident during this visit. I have confirmed the physical exam findings and have read and agree with the clinic note including the plan with the following addition: - pt with subtherapeutic INR. Will need b/l LE dopplers to assess for recurrent clots - Coumadin dc'd and patient started on xarelto given fluctuating INR levels - Will need MRI abd/pelvis to assess adrenal mass and to rule out extrinsic compression of IVC On exam- patient with 3 + b/l LE edema - Will check BMP to assess for worsening renal function

## 2013-09-11 NOTE — Assessment & Plan Note (Signed)
For dyslipidemia, daughter wants to know if pt should still take Lovaza No recent lipid panel in system Lipid Panel     Component Value Date/Time   CHOL 173 07/20/2011 0510   TRIG 69 07/20/2011 0510   HDL 68 07/20/2011 0510   CHOLHDL 2.5 07/20/2011 0510   VLDL 14 07/20/2011 0510   LDLCALC 91 07/20/2011 0510   Will defer to PCP

## 2013-09-11 NOTE — Assessment & Plan Note (Signed)
Pending MRI ab/pelvis stat to r/o malignancy  F/u in 2 weeks

## 2013-09-15 ENCOUNTER — Ambulatory Visit (HOSPITAL_COMMUNITY)
Admission: RE | Admit: 2013-09-15 | Discharge: 2013-09-15 | Disposition: A | Discharge status: Home / Self Care | Payer: Medicare Other | Source: Ambulatory Visit | Attending: Internal Medicine | Admitting: Internal Medicine

## 2013-09-15 ENCOUNTER — Telehealth: Payer: Self-pay | Admitting: Pharmacist

## 2013-09-15 ENCOUNTER — Ambulatory Visit (INDEPENDENT_AMBULATORY_CARE_PROVIDER_SITE_OTHER): Payer: Medicare Other | Admitting: Internal Medicine

## 2013-09-15 ENCOUNTER — Encounter: Payer: Self-pay | Admitting: Internal Medicine

## 2013-09-15 ENCOUNTER — Other Ambulatory Visit: Payer: Self-pay | Admitting: Internal Medicine

## 2013-09-15 ENCOUNTER — Other Ambulatory Visit: Payer: Self-pay | Admitting: *Deleted

## 2013-09-15 VITALS — BP 175/90 | HR 61 | Temp 97.4°F | Ht 71.0 in | Wt 188.0 lb

## 2013-09-15 DIAGNOSIS — I824Z9 Acute embolism and thrombosis of unspecified deep veins of unspecified distal lower extremity: Secondary | ICD-10-CM | POA: Insufficient documentation

## 2013-09-15 DIAGNOSIS — R600 Localized edema: Secondary | ICD-10-CM

## 2013-09-15 DIAGNOSIS — I824Y9 Acute embolism and thrombosis of unspecified deep veins of unspecified proximal lower extremity: Secondary | ICD-10-CM | POA: Insufficient documentation

## 2013-09-15 DIAGNOSIS — I82509 Chronic embolism and thrombosis of unspecified deep veins of unspecified lower extremity: Secondary | ICD-10-CM

## 2013-09-15 DIAGNOSIS — M7989 Other specified soft tissue disorders: Secondary | ICD-10-CM

## 2013-09-15 DIAGNOSIS — E278 Other specified disorders of adrenal gland: Secondary | ICD-10-CM

## 2013-09-15 DIAGNOSIS — I82409 Acute embolism and thrombosis of unspecified deep veins of unspecified lower extremity: Secondary | ICD-10-CM

## 2013-09-15 DIAGNOSIS — I82819 Embolism and thrombosis of superficial veins of unspecified lower extremities: Secondary | ICD-10-CM | POA: Insufficient documentation

## 2013-09-15 DIAGNOSIS — I82502 Chronic embolism and thrombosis of unspecified deep veins of left lower extremity: Secondary | ICD-10-CM

## 2013-09-15 DIAGNOSIS — I82491 Acute embolism and thrombosis of other specified deep vein of right lower extremity: Secondary | ICD-10-CM

## 2013-09-15 MED ORDER — GADOBENATE DIMEGLUMINE 529 MG/ML IV SOLN
15.0000 mL | Freq: Once | INTRAVENOUS | Status: AC
  Administered 2013-09-15: 15 mL via INTRAVENOUS

## 2013-09-15 MED ORDER — OMEGA-3-ACID ETHYL ESTERS 1 G PO CAPS: 1.0000 g | ORAL_CAPSULE | Freq: Every day | ORAL | Status: DC

## 2013-09-15 NOTE — Progress Notes (Signed)
   Subjective:    Patient ID: [name], male    DOB: April 04, 1924, 80 y.o.   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   He presents for f/u:   1) lower ext edema b/l stable.  He had Doppler of lower ext today results are as follows Right lower extremity - Positive for an acute deep vein thrombus of the mid calf posterior tibial, peroneal, profunda, femoral, and common femoral veins. A superficial thrombus was also noted in the saphenofemoral junction, and proximal greater saphenous veins. Left - Positive for a subacute thrombus of the common femoral vein. There is no evidence of a superficial thrombosis. Bilateral - No evidence of a Baker's cyst. -He had insurance problems getting prior auth for Xarelto so since last visit has not taken any anticoagulation.  He also never bridged with Lovenox before Coumadin initiation back in 05/2013.   -He is not taking Lasix 20 mg qd for lower ext edema due to increased urinary frequency                                                  Review of Systems  Respiratory: Negative for shortness of breath.   Cardiovascular: Negative for chest pain.  Musculoskeletal:       Denies lower ext pain        Objective:   Physical Exam  Nursing note and vitals reviewed. Constitutional: He is oriented to person, place, and time. He appears well-developed and well-nourished. He is cooperative.  HENT:  Head: Normocephalic and atraumatic.  Mouth/Throat: Oropharynx is clear and moist and mucous membranes are normal. No oropharyngeal exudate.  Eyes: Conjunctivae are normal. Right eye exhibits no discharge. Left  eye exhibits no discharge. No scleral icterus.  Cardiovascular: Normal rate, regular rhythm, S1 normal, S2 normal and normal heart sounds.   No murmur heard. 2+ lower ext edema R>L DP, PT pulses with Doppler  B/l lower ext warm  Pulmonary/Chest: Effort normal and breath sounds normal.  Abdominal: Soft. Bowel sounds are normal. He exhibits no distension. There is no tenderness.  Musculoskeletal: He exhibits no edema.  Neurological: He is alert and oriented to person, place, and time.  In a wheelchair   Skin: Skin is warm, dry and intact. No rash noted.  Psychiatric: He has a normal mood and affect. His speech is normal and behavior is normal. Judgment and thought content normal. Cognition and memory are normal.          Assessment & Plan:  F/u with Vascular surgery this week

## 2013-09-15 NOTE — Assessment & Plan Note (Signed)
Will do Xarelto 15 mg bid x 21 days and 20 mg qd

## 2013-09-15 NOTE — Progress Notes (Signed)
VASCULAR LAB PRELIMINARY  PRELIMINARY  PRELIMINARY  PRELIMINARY  Bilateral lower extremity venous duplex completed.    Preliminary report:  Right - Positive for an acute deep vein thrombus of the mid calf posterior tibial, peroneal, profunda, femoral, and common femoral veins. A superficial thrombus was also noted in the saphenofemoral junction, and proximal greater saphenous veins. Left - Positive for a subacute thrombus of the common femoral vein. There is no evidence of a superficial thrombosis. Bilateral - No evidence of a Baker's cyst.  Bodie Abernethy, RVS 09/15/2013, 4:23 PM

## 2013-09-15 NOTE — Assessment & Plan Note (Signed)
Doppler today + Right lower extremity - Positive for an acute deep vein thrombus of the mid calf posterior tibial, peroneal, profunda, femoral, and common femoral veins. A superficial thrombus was also noted in the saphenofemoral junction, and proximal greater saphenous veins. Left - Positive for a subacute thrombus of the common femoral vein. There is no evidence of a superficial thrombosis.   Pt will start taking Xarelto 15 mg bid x 21 days then 20 mg daily today.  This will be day 1 as his daughter had trouble getting the Rx filled  Will refer to vascular surgery this week for appt to see if patient may need thrombolytics.

## 2013-09-15 NOTE — Patient Instructions (Signed)
Please follow up with [facility] [phone] on [address] this week Take Xarelto 15 mg 2xper day for 21 days then 20 mg daily   Results of your ultrasound  Right - Positive for an acute deep vein thrombus of the mid calf posterior tibial, peroneal, profunda, femoral, and common femoral veins. A superficial thrombus was also noted in the saphenofemoral junction, and proximal greater saphenous veins. Left - Positive for a subacute thrombus of the common femoral vein. There is no evidence of a superficial thrombosis.    Deep Vein Thrombosis A deep vein thrombosis (DVT) is a blood clot that develops in the deep, larger veins of the leg, arm, or pelvis. These are more dangerous than clots that might form in veins near the surface of the body. A DVT can lead to complications if the clot breaks off and travels in the bloodstream to the lungs.  A DVT can damage the valves in your leg veins, so that instead of flowing upward, the blood pools in the lower leg. This is called post-thrombotic syndrome, and it can result in pain, swelling, discoloration, and sores on the leg. CAUSES Usually, several things contribute to blood clots forming. Contributing factors include:  The flow of blood slows down.  The inside of the vein is damaged in some way.  You have a condition that makes blood clot more easily. RISK FACTORS Some people are more likely than others to develop blood clots. Risk factors include:   Older age, especially over 45 years of age.  Having a family history of blood clots or if you have already had a blot clot.  Having major or lengthy surgery. This is especially true for surgery on the hip, knee, or belly (abdomen). Hip surgery is particularly high risk.  Breaking a hip or leg.  Sitting or lying still for a long time. This includes long-distance travel, paralysis, or recovery from an illness or surgery.  Having cancer or cancer treatment.  Having a long,  thin tube (catheter) placed inside a vein during a medical procedure.  Being overweight (obese).  Pregnancy and childbirth.  Hormone changes make the blood clot more easily during pregnancy.  The fetus puts pressure on the veins of the pelvis.  There is a risk of injury to veins during delivery or a caesarean. The risk is highest just after childbirth.  Medicines with the male hormone estrogen. This includes birth control pills and hormone replacement therapy.  Smoking.  Other circulation or heart problems.  SIGNS AND SYMPTOMS When a clot forms, it can either partially or totally block the blood flow in that vein. Symptoms of a DVT can include:  Swelling of the leg or arm, especially if one side is much worse.  Warmth and redness of the leg or arm, especially if one side is much worse.  Pain in an arm or leg. If the clot is in the leg, symptoms may be more noticeable or worse when standing or walking. The symptoms of a DVT that has traveled to the lungs (pulmonary embolism, PE) usually start suddenly and include:  Shortness of breath.  Coughing.  Coughing up blood or blood-tinged phlegm.  Chest pain. The chest pain is often worse with deep breaths.  Rapid heartbeat. Anyone with these symptoms should get emergency medical treatment right away. Call your local emergency services (911 in the [place] if you have these symptoms. DIAGNOSIS If a DVT is suspected, your health care provider will take a full medical  history and perform a physical exam. Tests that also may be required include:  Blood tests, including studies of the clotting properties of the blood.  Ultrasonography to see if you have clots in your legs or lungs.  X-rays to show the flow of blood when dye is injected into the veins (venography).  Studies of your lungs if you have any chest symptoms. PREVENTION  Exercise the legs regularly. Take a brisk 30-minute walk every day.  Maintain a weight that is  appropriate for your height.  Avoid sitting or lying in bed for long periods of time without moving your legs.  Women, particularly those over the age of 51 years, should consider the risks and benefits of taking estrogen medicines, including birth control pills.  Do not smoke, especially if you take estrogen medicines.  Long-distance travel can increase your risk of DVT. You should exercise your legs by walking or pumping the muscles every hour.  In-hospital prevention:  Many of the risk factors above relate to situations that exist with hospitalization, either for illness, injury, or elective surgery.  Your health care provider will assess you for the need for venous thromboembolism prophylaxis when you are admitted to the hospital. If you are having surgery, your surgeon will assess you the day of or day after surgery.  Prevention may include medical and nonmedical measures. TREATMENT Once identified, a DVT can be treated. It can also be prevented in some circumstances. Once you have had a DVT, you may be at increased risk for a DVT in the future. The most common treatment for DVT is blood thinning (anticoagulant) medicine, which reduces the blood's tendency to clot. Anticoagulants can stop new blood clots from forming and stop old ones from growing. They cannot dissolve existing clots. Your body does this by itself over time. Anticoagulants can be given by mouth, by IV access, or by injection. Your health care provider will determine the best program for you. Other medicines or treatments that may be used are:  Heparin or related medicines (low molecular weight heparin) are usually the first treatment for a blood clot. They act quickly. However, they cannot be taken orally.  Heparin can cause a fall in a component of blood that stops bleeding and forms blood clots (platelets). You will be monitored with blood tests to be sure this does not occur.  Warfarin is an anticoagulant that can be  swallowed. It takes a few days to start working, so usually heparin or related medicines are used in combination. Once warfarin is working, heparin is usually stopped.  Less commonly, clot dissolving drugs (thrombolytics) are used to dissolve a DVT. They carry a high risk of bleeding, so they are used mainly in severe cases, where your life or a limb is threatened.  Very rarely, a blood clot in the leg needs to be removed surgically.  If you are unable to take anticoagulants, your health care provider may arrange for you to have a filter placed in a main vein in your abdomen. This filter prevents clots from traveling to your lungs. HOME CARE INSTRUCTIONS  Take all medicines prescribed by your health care provider. Only take over-the-counter or prescription medicines for pain, fever, or discomfort as directed by your health care provider.  Warfarin. Most people will continue taking warfarin after hospital discharge. Your health care provider will advise you on the length of treatment (usually 3-6 months, sometimes lifelong).  Too much and too little warfarin are both dangerous. Too much warfarin increases the  risk of bleeding. Too little warfarin continues to allow the risk for blood clots. While taking warfarin, you will need to have regular blood tests to measure your blood clotting time. These blood tests usually include both the prothrombin time (PT) and international normalized ratio (INR) tests. The PT and INR results allow your health care provider to adjust your dose of warfarin. The dose can change for many reasons. It is critically important that you take warfarin exactly as prescribed, and that you have your PT and INR levels drawn exactly as directed.  Many foods, especially foods high in vitamin K, can interfere with warfarin and affect the PT and INR results. Foods high in vitamin K include spinach, kale, broccoli, cabbage, collard and turnip greens, brussel sprouts, peas, cauliflower,  seaweed, and parsley as well as beef and pork liver, green tea, and soybean oil. You should eat a consistent amount of foods high in vitamin K. Avoid major changes in your diet, or notify your health care provider before changing your diet. Arrange a visit with a dietitian to answer your questions.  Many medicines can interfere with warfarin and affect the PT and INR results. You must tell your health care provider about any and all medicines you take. This includes all vitamins and supplements. Be especially cautious with aspirin and anti-inflammatory medicines. Ask your health care provider before taking these. Do not take or discontinue any prescribed or over-the-counter medicine except on the advice of your health care provider or pharmacist.  Warfarin can have side effects, primarily excessive bruising or bleeding. You will need to hold pressure over cuts for longer than usual. Your health care provider or pharmacist will discuss other potential side effects.  Alcohol can change the body's ability to handle warfarin. It is best to avoid alcoholic drinks or consume only very small amounts while taking warfarin. Notify your health care provider if you change your alcohol intake.  Notify your dentist or other health care providers before procedures.  Activity. Ask your health care provider how soon you can go back to normal activities. It is important to stay active to prevent blood clots. If you are on anticoagulant medicine, avoid contact sports.  Exercise. It is very important to exercise. This is especially important while traveling, sitting, or standing for long periods of time. Exercise your legs by walking or by pumping the muscles frequently. Take frequent walks.  Compression stockings. These are tight elastic stockings that apply pressure to the lower legs. This pressure can help keep the blood in the legs from clotting. You may need to wear compression stockings at home to help prevent a  DVT.  Do not smoke. If you smoke, quit. Ask your health care provider for help with quitting smoking.  Learn as much as you can about DVT. Knowing more about the condition should help you keep it from coming back.  Wear a medical alert bracelet or carry a medical alert card. SEEK MEDICAL CARE IF:  You notice a rapid heartbeat.  You feel weaker or more tired than usual.  You feel faint.  You notice increased bruising.  You feel your symptoms are not getting better in the time expected.  You believe you are having side effects of medicine. SEEK IMMEDIATE MEDICAL CARE IF:  You have chest pain.  You have trouble breathing.  You have new or increased swelling or pain in one leg.  You cough up blood.  You notice blood in vomit, in a bowel movement, or in  urine. MAKE SURE YOU:  Understand these instructions.  Will watch your condition.  Will get help right away if you are not doing well or get worse. Document Released: 02/27/2005 Document Revised: 12/18/2012 Document Reviewed: 11/04/2012 [facility] Patient Information 2015 [place], [place]. This information is not intended to replace advice given to you by your health care provider. Make sure you discuss any questions you have with your health care provider.

## 2013-09-15 NOTE — Telephone Encounter (Signed)
Had been apprised last week by Dr. Aundra Dubin of her clinical decision (which I endorsed) to switch patient off of warfarin onto rivaroxaban/Xarelto for treatment of DVT. Of note--historically, at time of discharge for his initial index VTE--the patient was supposed to have been discharged (prescriptions WERE written/sent to his OP Rx) on concomitant warfarin + OP Lovenox bridge therapy until such time INR was found therapeutic. Upon first visit, the daughter who accompanies the patient to [facility] indicated to me that she did NOT have the [facility] Rx filled/dispensed---citing her worry that this was "too much". The ability to maintain his INR in target range has subsequently been impacted upon by having [facility] RN collected INRs and dosing instructions then having to be provided by phone through the [facility] RN to the family. The daughter also cites that the patient likes to eat dark green leafy vegetables and she cited that she was aware through TV advertising of a new option for treatment of VTE. We discussed the risks and benefits of rivaroxaban/Xarelto to include the risk(s) of bleeding for which the package insert indicates that "there is no known pharmacologic reversal of this agent". We discussed that [place] does have a management strategy to address patients who may bleed upon rivaroxaban (the protocol which is [place]--P&T Committee/Medical Board reviewed and approved). We talked about a potential advantage (as the clinical trial for DVT indicated non-inferiority for rivaroxaban vs. Warfarin + Lovenox) as being after the initial 3 weeks of therapy--that this would be a ONCE DAILY medication with no food-drug interactions with dark green leafy vegetables. She was interested in this as an option for her father. At last visit with Dr. Aundra Dubin with an informed discussion and with the advice and consent of the patient/his daughter--a decision to switch OFF of warfarin ON TO rivaroxaban was made. A prescription was  electronically prescribed to the preferred pharmacy. The  Daughter states that when she went to pick this up there was some confusion/"push-back" by the pharmacy who cited that the patient's medicare provider was rejecting the prescription. The patient has not had rivaroxaban/Xarelto since that time. Today--the patient's daughter presents to clinic relating those details. So as to be able to start therapy immediately, the patient was enrolled while in clinic to the [place] free 30 day trial offer (51 tablets--covering 15mg  PO BID x 21 days; switching to 20mg  PO QD thereafter for a total of 30 days treatment. At that time--daughter is understanding that she will have worked through the details of her father's medicare provider/his preferred Pharmacy--to make certain that all subsequent refills as necessary will be able to be dispensed with a typical tier 2 co-pay). She acknowledges her responsibility in doing so. The Xarelto Starter Pak was phoned to the [facility] OP [facility] and dispensed where I went and picked it up--brought it back to the [facility] where the first dose of Xarelto was witnessed administered by Sander Nephew RN at 4:45PM. All remainder of Xarelto Starter Pak was provided to daughter. She understands to call [facility] or bring father to ED in the event of any bleeding, shortness of breath or chest-pain.

## 2013-09-16 NOTE — Progress Notes (Signed)
INTERNAL MEDICINE TEACHING ATTENDING ADDENDUM - Aldine Contes, MD: I personally saw and evaluated Mr. Gudiel in this clinic visit in conjunction with the resident, Dr. Aundra Dubin. I have discussed patient's plan of care with medical resident during this visit. I have confirmed the physical exam findings and have read and agree with the clinic note including the plan with the following addition: - Patient with new positive extensive DVT in right LE and chronic left DVT - On exam patient with 3 + RLE pitting edema, no tenderness, mild increased local warmth - Was started on xarelto on last visit secondary to fluctuating INR on coumadin but was unable to fill the prescription - Was given a xarelto starter pack today and was advised to pick up prescription from pharmacy - will refer to vascular surgery for further follow up

## 2013-09-17 ENCOUNTER — Ambulatory Visit (INDEPENDENT_AMBULATORY_CARE_PROVIDER_SITE_OTHER): Payer: Medicare Other | Admitting: Surgery

## 2013-09-17 ENCOUNTER — Ambulatory Visit (HOSPITAL_COMMUNITY)
Admission: RE | Admit: 2013-09-17 | Discharge: 2013-09-17 | Disposition: A | Discharge status: Home / Self Care | Payer: Medicare Other | Source: Ambulatory Visit | Attending: Surgery | Admitting: Surgery

## 2013-09-17 ENCOUNTER — Other Ambulatory Visit: Payer: Self-pay | Admitting: Surgery

## 2013-09-17 ENCOUNTER — Encounter: Payer: Self-pay | Admitting: Surgery

## 2013-09-17 VITALS — BP 161/88 | HR 61 | Ht 71.0 in | Wt 185.0 lb

## 2013-09-17 DIAGNOSIS — I82409 Acute embolism and thrombosis of unspecified deep veins of unspecified lower extremity: Secondary | ICD-10-CM

## 2013-09-17 NOTE — Progress Notes (Signed)
Patient name: [name] MRN: [id number] DOB: 09/16/1924 Sex: male   Referred by: Dr. Aundra Dubin  Reason for referral:  Chief Complaint  Patient presents with  . New Evaluation    acute DVT of mid calf    HISTORY OF PRESENT ILLNESS: The patient is here today with his daughter for discussions of DVT   He has a very complicated recent past.  He underwent TURP in March of this year.  He ultimately developed a left leg DVT.  He was started on anticoagulation but had hematuria and so this was discontinued.  He did have a filter placed at that time.  He has been dealing with swelling in his left leg which did not respond to diuresis.  He states that for the past month he has been having swelling in his right leg as well.  He recently underwent an ultrasound which showed right leg DVT.  He was recently started on Xaralto. A CT scan on 08/21/2013 was negative for PE.  The patient suffers from states 2/3 renal insufficiency.  He has a meningioma.  He suffers from frequent falls.  He has had a stroke in the remote past.  He has an ejection fraction of 55-60% with grade 1 diastolic dysfunction.   Past Medical History  Diagnosis Date  . Hypertension   . Difficulty hearing, right     BILATERAL HEARING LOSS - BEST TO TRY TO SPEAK INTO LEFT EAR  . Meningioma   . Frequent falls   . BPH (benign prostatic hypertrophy)     TURP 05/19/13  . Chronic kidney disease     CHRONIC KIDNEY DISEASE, 2  . Anemia   . Thrombocytopenia   . Stroke     Cerebellar, 2013; WALKS WITH WALKER, ABLE TO DRESS AND BATHE HIMSELF BUT FAMILY TRIES TO PROVIDE SUPERVISION BECAUSE OF HIS HX OF FALL AND WEAKNESS LEGS, ARMS   . Incontinence of urine     SOME INCONTINENCE  . Hyperlipidemia   . DVT (deep venous thrombosis)     Past Surgical History  Procedure Laterality Date  . Tube put in ear yrs ago    . Transurethral resection of prostate N/A 05/19/2013    Procedure: TRANSURETHRAL RESECTION OF THE PROSTATE WITH GYRUS  INSTRUMENTS;  Surgeon: Ailene Rud, MD;  Location: [facility];  Service: Urology;  Laterality: N/A;  . Cystoscopy N/A 06/13/2013    Procedure: CYSTOSCOPY FLEXIBLE BEDSIDE;  Surgeon: Ardis Hughs, MD;  Location: [facility];  Service: Urology;  Laterality: N/A;    History   Social History  . Marital Status: Widowed    Spouse Name: N/A    Number of Children: N/A  . Years of Education: N/A   Occupational History  . Not on file.   Social History Main Topics  . Smoking status: Former Smoker    Types: Cigarettes  . Smokeless tobacco: Not on file  . Alcohol Use: No     Comment: QUIT SMOKING 50 YRS AGO  . Drug Use: No  . Sexual Activity: Not on file   Other Topics Concern  . Not on file   Social History Narrative   Lives with daughter. Ambulates w walker. Can make basic meals (cereal). Very hard of hearing.     Family History  Problem Relation Age of Onset  . Diabetes Mother   . Heart disease Mother   . Hypertension Mother   . Heart disease Father   . Hypertension Father   .  Hypertension Daughter     Allergies as of 09/17/2013  . (No Known Allergies)    Current Outpatient Prescriptions on File Prior to Visit  Medication Sig Dispense Refill  . atorvastatin (LIPITOR) 20 MG tablet Take 20 mg by mouth daily at 6 PM.      . Cholecalciferol (VITAMIN D PO) Take 1 tablet by mouth daily.      . clopidogrel (PLAVIX) 75 MG tablet Take 75 mg by mouth daily with breakfast.      . furosemide (LASIX) 20 MG tablet Take 1 tablet (20 mg total) by mouth daily.  30 tablet  2  . Iron-Vitamins (GERITOL PO) Take 1 tablet by mouth daily.      . metoprolol tartrate (LOPRESSOR) 25 MG tablet Take 25 mg by mouth 2 (two) times daily.      Marland Kitchen omega-3 acid ethyl esters (LOVAZA) 1 G capsule Take 1 capsule (1 g total) by mouth daily.  30 capsule  3  . Rivaroxaban (XARELTO) 15 MG TABS tablet Take 1 tablet (15 mg total) by mouth 2 (two) times daily with a meal.  42 tablet  0  . [START ON 09/25/2013]  rivaroxaban (XARELTO) 20 MG TABS tablet Take 1 tablet (20 mg total) by mouth daily with supper.  30 tablet  2   No current facility-administered medications on file prior to visit.     REVIEW OF SYSTEMS: Cardiovascular: No chest pain, chest pressure, palpitations, orthopnea, or dyspnea on exertion. No claudication or rest pain,  positive DVT bilaterally.  Positive leg swelling Pulmonary: No productive cough, asthma or wheezing. Neurologic: positive weakness and numbness.  Positive hearing loss Hematologic: No bleeding problems or clotting disorders. Musculoskeletal: No joint pain or joint swelling. Gastrointestinal: No blood in stool or hematemesis Genitourinary: No dysuria or hematuria. Psychiatric:: No history of major depression. Integumentary: No rashes or ulcers. Constitutional: No fever or chills.  PHYSICAL EXAMINATION: General: The patient appears their stated age.  Vital signs are BP 161/88  Pulse 61  Ht 5\' 11"  (1.803 m)  Wt 185 lb (83.915 kg)  BMI 25.81 kg/m2  SpO2 100% HEENT:  No gross abnormalities Pulmonary: Respirations are non-labored Musculoskeletal: There are no major deformities.   Neurologic: No focal weakness or paresthesias are detected, Skin: There are no ulcer or rashes noted. Psychiatric: The patient has normal affect. Cardiovascular: There is a regular rate and rhythm without significant murmur appreciated. palpable pedal pulses bilaterally.  3+ pitting edema bilaterally, the right leg is more severe.  No evidence of phlegmasia  Diagnostic Studies:  I have reviewed his most recent Doppler studies which are positive for acute DVT of the right leg and subacute/chronic in the left    Assessment:  Acute right leg DVT Subacute/chronic left leg DVT Plan:  The patient is now currently on anticoagulations without any bleeding complications.  He has a filter in place from several months ago.  He was sent over for discussions regarding from a lysis.  I have  also discussed this case with Dr. Maryclare Bean..  I do feel that while the patient has some relative contraindications for from a lysis, given the amount of swelling in his right leg, he may receive some benefit.  The patient is currently having trouble getting compression stockings on and if some of the clot burden can be removed this may help improve some of his swelling.  I'm not sure whether or not he would receive any benefit from from a lysis of the left leg given  the duration of the clot.  The patient has been sent over to the hospital to be further evaluated by interventional radiology.  Regardless of whether or not he undergoes from a lysis, the patient will need leg elevation and compression stockings.  Given his recent history, he may also benefit from lifelong anticoagulation.  This will certainly depend on his activity level and propensity for falling.     Eldridge Abrahams, M.D. [facility] Office: [phone] Pager:  [phone]

## 2013-09-17 NOTE — Consult Note (Signed)
Reason for Consult:RLE swelling and DVT Referring Physician: Tate Arroyo is an 80 y.o. male.  HPI: [name] is a 80 YO male with a one month Hx of increasing RLE edema. He has a known Hx of LLE DDVT and is S/P IVC filter placement. At that time, he had a recent TURP and failed anticoagulation due to hematuria. H had a CVA in 2013 and did have trouble ambulating prior to this recent event. 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 Xarelto was started three days ago after the venous doppler study, and his symptoms have improved.  Past Medical History  Diagnosis Date  . Hypertension   . Difficulty hearing, right     BILATERAL HEARING LOSS - BEST TO TRY TO SPEAK INTO LEFT EAR  . Meningioma   . Frequent falls   . BPH (benign prostatic hypertrophy)     TURP 05/19/13  . Chronic kidney disease     CHRONIC KIDNEY DISEASE, 2  . Anemia   . Thrombocytopenia   . Stroke     Cerebellar, 2013; WALKS WITH WALKER, ABLE TO DRESS AND BATHE HIMSELF BUT FAMILY TRIES TO PROVIDE SUPERVISION BECAUSE OF HIS HX OF FALL AND WEAKNESS LEGS, ARMS   . Incontinence of urine     SOME INCONTINENCE  . Hyperlipidemia   . DVT (deep venous thrombosis)     Past Surgical History  Procedure Laterality Date  . Tube put in ear yrs ago    . Transurethral resection of prostate N/A 05/19/2013    Procedure: TRANSURETHRAL RESECTION OF THE PROSTATE WITH GYRUS INSTRUMENTS;  Surgeon: Ailene Rud, MD;  Location: [facility];  Service: Urology;  Laterality: N/A;  . Cystoscopy N/A 06/13/2013    Procedure: CYSTOSCOPY FLEXIBLE BEDSIDE;  Surgeon: Ardis Hughs, MD;  Location: [facility];  Service: Urology;  Laterality: N/A;    Family History  Problem Relation Age of Onset  . Diabetes Mother   . Heart disease Mother   . Hypertension Mother   . Heart disease Father   . Hypertension Father   . Hypertension Daughter     Social History:  reports that he has quit smoking. His smoking use included Cigarettes.  He smoked 0.00 packs per day. He does not have any smokeless tobacco history on file. He reports that he does not drink alcohol or use illicit drugs.  Allergies: No Known Allergies  Medications: See list  No results found for this or any previous visit (from the past 48 hour(s)).  [name]  09/15/2013   CLINICAL DATA:  Indeterminate adrenal lesion.  EXAM: MRI ABDOMEN WITHOUT AND WITH CONTRAST  TECHNIQUE: Multiplanar multisequence MR imaging of the abdomen was performed both before and after the administration of intravenous contrast.  CONTRAST:  84mL MULTIHANCE GADOBENATE DIMEGLUMINE 529 MG/ML IV SOLN  COMPARISON:  CT 08/21/2013  FINDINGS: Exam is degraded by patient respiratory motion. There is loss signal intensity on the opposed phase imaging within left adrenal gland lesion consistent with a benign adenoma.  There are multiple nonenhancing hepatic cysts. The pancreas appears normal. No biliary duct dilatation.  There is dependent sludge and small stones within the gallbladder. The common bile duct is mildly dilated which is common for age.  There are multiple nonenhancing renal cysts left or right. Stomach and limited view of the bowel are unremarkable. Lung bases are clear.  IMPRESSION: 1. Benign left adrenal adenoma. 2. Multiple benign hepatic and renal cyst. 3. Cholelithiasis and  biliary sludge in the gallbladder without evidence of acute cholecystitis.   Electronically Signed   By: Suzy Bouchard M.D.   On: 09/15/2013 14:04    ROS There were no vitals taken for this visit. Physical Exam  Musculoskeletal:  RLE is warm and pink. DP and PT pulses are triphasic by doppler exam. 2 + pitting edema at ankles only. 1+ edema at L ankle.    Assessment/Plan:  [name] has chronic LLE DVT and Subacute RLE DVT (symptoms for one month), without evidence of ischemia. He does have an intracranial neoplasm which is an extra-axial meningioma. Because of the low likelihood of additional  benefit from lytics and relative contra-indication, I would only intervene if there were threatened ischemia. Recommendations are to continue anticoagulation.  [name],ART A 09/17/2013, 12:14 PM

## 2013-09-18 ENCOUNTER — Telehealth: Payer: Self-pay | Admitting: *Deleted

## 2013-09-18 NOTE — Telephone Encounter (Signed)
Agree, thank you

## 2013-09-18 NOTE — Telephone Encounter (Signed)
Caprice Red OT  with [facility] called [phone] - needs a VO for one addition visit next week. VO given. Hilda Blades Tessla Spurling RN 09/18/13 4:30PM

## 2013-09-25 ENCOUNTER — Telehealth: Payer: Self-pay | Admitting: *Deleted

## 2013-09-25 NOTE — Telephone Encounter (Signed)
Medication approved until 09/26/2014. Will fax approval to pharmacy.Despina Hidden Cassady7/16/20152:09 PM     [id number] Pt ID# [id number].

## 2013-09-26 ENCOUNTER — Telehealth: Payer: Self-pay | Admitting: Internal Medicine

## 2013-09-26 NOTE — Telephone Encounter (Signed)
Called daughter patient is taking Xarelto.  Right lower extremity swelling is somewhat better.  [facility]VS told daughter pt was not a good candidate for lysis.   Aundra Dubin MD

## 2013-12-18 ENCOUNTER — Inpatient Hospital Stay (HOSPITAL_COMMUNITY)
Admission: EM | Admit: 2013-12-18 | Discharge: 2013-12-22 | DRG: 065 | Disposition: A | Discharge status: Skilled Nursing Facility | Payer: Medicare Other | Attending: Oncology | Admitting: Oncology

## 2013-12-18 ENCOUNTER — Encounter (HOSPITAL_COMMUNITY): Payer: Self-pay | Admitting: Emergency Medicine

## 2013-12-18 ENCOUNTER — Emergency Department (HOSPITAL_COMMUNITY): Payer: Medicare Other

## 2013-12-18 ENCOUNTER — Other Ambulatory Visit: Payer: Self-pay

## 2013-12-18 ENCOUNTER — Telehealth: Payer: Self-pay | Admitting: *Deleted

## 2013-12-18 ENCOUNTER — Inpatient Hospital Stay (HOSPITAL_COMMUNITY): Payer: Medicare Other

## 2013-12-18 DIAGNOSIS — I639 Cerebral infarction, unspecified: Secondary | ICD-10-CM

## 2013-12-18 DIAGNOSIS — I6340 Cerebral infarction due to embolism of unspecified cerebral artery: Secondary | ICD-10-CM | POA: Diagnosis not present

## 2013-12-18 DIAGNOSIS — R296 Repeated falls: Secondary | ICD-10-CM

## 2013-12-18 DIAGNOSIS — I129 Hypertensive chronic kidney disease with stage 1 through stage 4 chronic kidney disease, or unspecified chronic kidney disease: Secondary | ICD-10-CM | POA: Diagnosis present

## 2013-12-18 DIAGNOSIS — D649 Anemia, unspecified: Secondary | ICD-10-CM

## 2013-12-18 DIAGNOSIS — H9190 Unspecified hearing loss, unspecified ear: Secondary | ICD-10-CM | POA: Diagnosis present

## 2013-12-18 DIAGNOSIS — H4310 Vitreous hemorrhage, unspecified eye: Secondary | ICD-10-CM | POA: Diagnosis present

## 2013-12-18 DIAGNOSIS — R270 Ataxia, unspecified: Secondary | ICD-10-CM | POA: Diagnosis present

## 2013-12-18 DIAGNOSIS — R269 Unspecified abnormalities of gait and mobility: Secondary | ICD-10-CM

## 2013-12-18 DIAGNOSIS — N182 Chronic kidney disease, stage 2 (mild): Secondary | ICD-10-CM | POA: Diagnosis present

## 2013-12-18 DIAGNOSIS — Z87891 Personal history of nicotine dependence: Secondary | ICD-10-CM

## 2013-12-18 DIAGNOSIS — R2990 Unspecified symptoms and signs involving the nervous system: Secondary | ICD-10-CM | POA: Diagnosis present

## 2013-12-18 DIAGNOSIS — H4312 Vitreous hemorrhage, left eye: Secondary | ICD-10-CM | POA: Diagnosis present

## 2013-12-18 DIAGNOSIS — I739 Peripheral vascular disease, unspecified: Secondary | ICD-10-CM | POA: Diagnosis present

## 2013-12-18 DIAGNOSIS — N183 Chronic kidney disease, stage 3 unspecified: Secondary | ICD-10-CM

## 2013-12-18 DIAGNOSIS — Z8249 Family history of ischemic heart disease and other diseases of the circulatory system: Secondary | ICD-10-CM

## 2013-12-18 DIAGNOSIS — Z86718 Personal history of other venous thrombosis and embolism: Secondary | ICD-10-CM

## 2013-12-18 DIAGNOSIS — E785 Hyperlipidemia, unspecified: Secondary | ICD-10-CM | POA: Diagnosis present

## 2013-12-18 DIAGNOSIS — D329 Benign neoplasm of meninges, unspecified: Secondary | ICD-10-CM | POA: Diagnosis present

## 2013-12-18 DIAGNOSIS — I10 Essential (primary) hypertension: Secondary | ICD-10-CM

## 2013-12-18 DIAGNOSIS — B351 Tinea unguium: Secondary | ICD-10-CM | POA: Diagnosis present

## 2013-12-18 DIAGNOSIS — I69351 Hemiplegia and hemiparesis following cerebral infarction affecting right dominant side: Secondary | ICD-10-CM

## 2013-12-18 DIAGNOSIS — Z7901 Long term (current) use of anticoagulants: Secondary | ICD-10-CM

## 2013-12-18 DIAGNOSIS — I69993 Ataxia following unspecified cerebrovascular disease: Secondary | ICD-10-CM

## 2013-12-18 DIAGNOSIS — R471 Dysarthria and anarthria: Secondary | ICD-10-CM | POA: Diagnosis present

## 2013-12-18 DIAGNOSIS — H9193 Unspecified hearing loss, bilateral: Secondary | ICD-10-CM

## 2013-12-18 DIAGNOSIS — N400 Enlarged prostate without lower urinary tract symptoms: Secondary | ICD-10-CM | POA: Diagnosis present

## 2013-12-18 DIAGNOSIS — I82502 Chronic embolism and thrombosis of unspecified deep veins of left lower extremity: Secondary | ICD-10-CM

## 2013-12-18 LAB — DIFFERENTIAL
Basophils Absolute: 0 10*3/uL (ref 0.0–0.1)
Basophils Relative: 0 % (ref 0–1)
Eosinophils Absolute: 0.1 10*3/uL (ref 0.0–0.7)
Eosinophils Relative: 2 % (ref 0–5)
Lymphocytes Relative: 19 % (ref 12–46)
Lymphs Abs: 1.3 10*3/uL (ref 0.7–4.0)
Monocytes Absolute: 0.5 10*3/uL (ref 0.1–1.0)
Monocytes Relative: 7 % (ref 3–12)
Neutro Abs: 5 10*3/uL (ref 1.7–7.7)
Neutrophils Relative %: 72 % (ref 43–77)

## 2013-12-18 LAB — RAPID URINE DRUG SCREEN, HOSP PERFORMED
Amphetamines: NOT DETECTED
Barbiturates: NOT DETECTED
Benzodiazepines: NOT DETECTED
Cocaine: NOT DETECTED
Opiates: NOT DETECTED
Tetrahydrocannabinol: NOT DETECTED

## 2013-12-18 LAB — ETHANOL: Alcohol, Ethyl (B): <11 mg/dL (ref 0–11)

## 2013-12-18 LAB — I-STAT CHEM 8, ED
BUN: 15 mg/dL (ref 6–23)
Calcium, Ion: 1.29 mmol/L (ref 1.13–1.30)
Chloride: 110 mEq/L (ref 96–112)
Creatinine, Ser: 1.1 mg/dL (ref 0.50–1.35)
Glucose, Bld: 104 mg/dL — ABNORMAL HIGH (ref 70–99)
HCT: 37 % — ABNORMAL LOW (ref 39.0–52.0)
Hemoglobin: 12.6 g/dL — ABNORMAL LOW (ref 13.0–17.0)
Potassium: 3.8 mEq/L (ref 3.7–5.3)
Sodium: 141 mEq/L (ref 137–147)
TCO2: 28 mmol/L (ref 0–100)

## 2013-12-18 LAB — COMPREHENSIVE METABOLIC PANEL
ALT: 8 U/L (ref 0–53)
AST: 19 U/L (ref 0–37)
Albumin: 3.7 g/dL (ref 3.5–5.2)
Alkaline Phosphatase: 74 U/L (ref 39–117)
Anion gap: 14 (ref 5–15)
BUN: 16 mg/dL (ref 6–23)
CO2: 25 mEq/L (ref 19–32)
Calcium: 10.5 mg/dL (ref 8.4–10.5)
Chloride: 104 mEq/L (ref 96–112)
Creatinine, Ser: 1.02 mg/dL (ref 0.50–1.35)
GFR calc Af Amer: 74 mL/min — ABNORMAL LOW (ref >90)
GFR calc non Af Amer: 64 mL/min — ABNORMAL LOW (ref >90)
Glucose, Bld: 100 mg/dL — ABNORMAL HIGH (ref 70–99)
Potassium: 4.1 mEq/L (ref 3.7–5.3)
Sodium: 143 mEq/L (ref 137–147)
Total Bilirubin: 0.5 mg/dL (ref 0.3–1.2)
Total Protein: 7.8 g/dL (ref 6.0–8.3)

## 2013-12-18 LAB — I-STAT TROPONIN, ED: Troponin i, poc: 0.01 ng/mL (ref 0.00–0.08)

## 2013-12-18 LAB — PROTIME-INR
INR: 1.12 (ref 0.00–1.49)
Prothrombin Time: 14.4 seconds (ref 11.6–15.2)

## 2013-12-18 LAB — URINALYSIS, ROUTINE W REFLEX MICROSCOPIC
Bilirubin Urine: NEGATIVE
Glucose, UA: NEGATIVE mg/dL
Ketones, ur: 15 mg/dL — AB
Leukocytes, UA: NEGATIVE
Nitrite: NEGATIVE
Protein, ur: NEGATIVE mg/dL
Specific Gravity, Urine: 1.014 (ref 1.005–1.030)
Urobilinogen, UA: 1 mg/dL (ref 0.0–1.0)
pH: 7.5 (ref 5.0–8.0)

## 2013-12-18 LAB — URINE MICROSCOPIC-ADD ON

## 2013-12-18 LAB — CBC
HCT: 36.1 % — ABNORMAL LOW (ref 39.0–52.0)
Hemoglobin: 11.9 g/dL — ABNORMAL LOW (ref 13.0–17.0)
MCH: 29.5 pg (ref 26.0–34.0)
MCHC: 33 g/dL (ref 30.0–36.0)
MCV: 89.6 fL (ref 78.0–100.0)
Platelets: 152 10*3/uL (ref 150–400)
RBC: 4.03 MIL/uL — ABNORMAL LOW (ref 4.22–5.81)
RDW: 14.7 % (ref 11.5–15.5)
WBC: 6.8 10*3/uL (ref 4.0–10.5)

## 2013-12-18 LAB — APTT: aPTT: 32 seconds (ref 24–37)

## 2013-12-18 MED ORDER — STROKE: EARLY STAGES OF RECOVERY BOOK
Freq: Once | Status: AC
  Administered 2013-12-21: 14:00:00
  Filled 2013-12-18: qty 1

## 2013-12-18 NOTE — ED Notes (Signed)
IV insertion attempted without success, IV team paged.

## 2013-12-18 NOTE — H&P (Signed)
Date: 12/19/2013               Patient Name:  [name] MRN: [id number]  DOB: [birth year] Age / Sex: 80 y.o., male   PCP: [name], DO              Medical Service: Internal Medicine Teaching Service              Attending Physician: Dr. Annia Belt, MD    First Contact: Dr. Marvel Plan Pager: [phone]  Second Contact: Dr. 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  Mr. Schroepfer has trouble hearing, so history was obtained from him and his daughter.  His daughter reports that he has had difficulty standing since he woke up yesterday.  This has progressed and was more severe today.  She says that he starts to fall to the right side whenever he stands up.  She thinks that his speech is slurred, but not changed from baseline.  He had a cerebellar stroke in 2013.  At that time, he was unwilling to walk and spent the whole day in bed, which alerted his daughter to call EMS.  He had some mild residual right sided weakness, and he walks with a walker due to balance issues since his stroke.  However, he is usually able stand by himself without falling to the side and walk with a walker.  He has otherwise been doing well with good appetite and no fevers.  His daughter says he has not been complaining of eye issues, but when asked he says that he can see "just a hair" in his left eye.  In the ER, his blood pressure was elevated initially to 201/106, and he had an episode of emesis in triage.  Head CT showed no acute stroke, but MRI showed a small acute on chronic infarct in the left occipital  pole.  A possible vitreous hemorrhage vs. Retinal detachment was identified in his left eye.  His meningioma was unchanged from prior imaging.  Review of Systems: Review of Systems  Constitutional: Negative for fever, chills, weight loss, malaise/fatigue and diaphoresis.  HENT: Negative for congestion and sore throat.   Eyes: Negative for blurred vision and double vision.  Respiratory: Negative for cough, sputum production and shortness of breath.   Cardiovascular: Negative for chest pain, palpitations, orthopnea and leg swelling.  Gastrointestinal: Positive for nausea and vomiting. Negative for heartburn, abdominal pain, diarrhea and constipation.  Genitourinary: Negative for dysuria, urgency and hematuria.  Musculoskeletal: Negative for joint pain and myalgias.  Skin: Negative for rash.  Neurological: Negative for dizziness, sensory change, speech change, focal weakness, weakness and headaches.       Discoordination.  Psychiatric/Behavioral: Negative for depression.    Meds: Medications Prior to Admission  Medication Sig Dispense Refill  . atorvastatin (LIPITOR) 20 MG tablet Take 20 mg by mouth daily at 6 PM.      . Cholecalciferol (VITAMIN D PO) Take 1 tablet by mouth daily.      . clopidogrel (PLAVIX) 75 MG tablet Take  75 mg by mouth daily with breakfast.      . Iron-Vitamins (GERITOL PO) Take 1 tablet by mouth daily.      . metoprolol tartrate (LOPRESSOR) 25 MG tablet Take 25 mg by mouth 2 (two) times daily.      Marland Kitchen omega-3 acid ethyl esters (LOVAZA) 1 G capsule Take 1 capsule (1 g total) by mouth daily.  30 capsule  3  . rivaroxaban (XARELTO) 20 MG TABS tablet Take 1 tablet (20 mg total) by mouth daily with supper.  30 tablet  2  . Rivaroxaban (XARELTO) 15 MG TABS tablet Take 1 tablet (15 mg total) by mouth 2 (two) times daily with a meal.  42 tablet  0   Current Facility-Administered Medications  Medication Dose Route Frequency Provider Last Rate Last Dose  .  stroke: mapping  our early stages of recovery book   Does not apply Once Jones Bales, MD        Allergies: Allergies as of 12/18/2013  . (No Known Allergies)   Past Medical History  Diagnosis Date  . Hypertension   . Difficulty hearing, right     BILATERAL HEARING LOSS - BEST TO TRY TO SPEAK INTO LEFT EAR  . Meningioma   . Frequent falls   . BPH (benign prostatic hypertrophy)     TURP 05/19/13  . Chronic kidney disease     CHRONIC KIDNEY DISEASE, 2  . Anemia   . Thrombocytopenia   . Stroke     Cerebellar, 2013; WALKS WITH WALKER, ABLE TO DRESS AND BATHE HIMSELF BUT FAMILY TRIES TO PROVIDE SUPERVISION BECAUSE OF HIS HX OF FALL AND WEAKNESS LEGS, ARMS   . Incontinence of urine     SOME INCONTINENCE  . Hyperlipidemia   . DVT (deep venous thrombosis)    Past Surgical History  Procedure Laterality Date  . Tube put in ear yrs ago    . Transurethral resection of prostate N/A 05/19/2013    Procedure: TRANSURETHRAL RESECTION OF THE PROSTATE WITH GYRUS INSTRUMENTS;  Surgeon: Ailene Rud, MD;  Location: [facility];  Service: Urology;  Laterality: N/A;  . Cystoscopy N/A 06/13/2013    Procedure: CYSTOSCOPY FLEXIBLE BEDSIDE;  Surgeon: Ardis Hughs, MD;  Location: [place];  Service: Urology;  Laterality: N/A;   Family History  Problem Relation Age of Onset  . Diabetes Mother   . Heart disease Mother   . Hypertension Mother   . Heart disease Father   . Hypertension Father   . Hypertension Daughter    History   Social History  . Marital Status: Widowed    Spouse Name: N/A    Number of Children: N/A  . Years of Education: N/A   Occupational History  . Not on file.   Social History Main Topics  . Smoking status: Former Smoker    Types: Cigarettes  . Smokeless tobacco: Not on file  . Alcohol Use: No     Comment: QUIT SMOKING 50 YRS AGO  . Drug Use: No  . Sexual Activity: No   Other Topics Concern  . Not on file   Social History Narrative   Lives with daughter. Ambulates w  walker. Can make basic meals (cereal). Very hard of hearing.     Physical Exam: Filed Vitals:   12/18/13 2238  BP: 185/91  Pulse: 108  Temp: 98.1  Resp: 24   Physical Exam  Constitutional: He is oriented to person, place, and time.  Pleasant, thin, elderly gentleman, hard  of hearing.  Sitting upright in bed.  HENT:  Head: Normocephalic and atraumatic.  Eyes: Conjunctivae and EOM are normal. Pupils are equal, round, and reactive to light. No scleral icterus.  Neck: Normal range of motion. Neck supple.  Cardiovascular: Normal rate, regular rhythm and normal heart sounds.   Pulmonary/Chest: Effort normal and breath sounds normal. No respiratory distress. He has no wheezes. He has no rales.  Abdominal: Soft. Bowel sounds are normal. He exhibits no distension. There is no tenderness.  Musculoskeletal: Normal range of motion. He exhibits no edema and no tenderness.  Neurological: He is alert and oriented to person, place, and time. He has normal reflexes. No cranial nerve deficit. He exhibits normal muscle tone. Coordination normal.  Finger to nose slow bilaterally, slightly worse with R arm.  Heel to shin normal and Babinski downgoing. 5/5 strength throughout with sensation to light touch intact.  Gait not examined due to concern for safety.  Skin: Skin is warm and dry. No rash noted. No erythema.    Lab results: Basic Metabolic Panel:  Recent Labs  12/18/13 1732 12/18/13 1750  NA 143 141  K 4.1 3.8  CL 104 110  CO2 25  --   GLUCOSE 100* 104*  BUN 16 15  CREATININE 1.02 1.10  CALCIUM 10.5  --    Liver Function Tests:  Recent Labs  12/18/13 1732  AST 19  ALT 8  ALKPHOS 74  BILITOT 0.5  PROT 7.8  ALBUMIN 3.7   CBC:  Recent Labs  12/18/13 1732 12/18/13 1750  WBC 6.8  --   NEUTROABS 5.0  --   HGB 11.9* 12.6*  HCT 36.1* 37.0*  MCV 89.6  --   PLT 152  --    Coagulation:  Recent Labs  12/18/13 1732  LABPROT 14.4  INR 1.12   Urine Drug Screen: Drugs of  Abuse     Component Value Date/Time   LABOPIA NONE DETECTED 12/18/2013 1904   COCAINSCRNUR NONE DETECTED 12/18/2013 1904   LABBENZ NONE DETECTED 12/18/2013 1904   AMPHETMU NONE DETECTED 12/18/2013 1904   THCU NONE DETECTED 12/18/2013 1904   LABBARB NONE DETECTED 12/18/2013 1904    Alcohol Level:  Recent Labs  12/18/13 1732  ETH <11   Urinalysis:  Recent Labs  12/18/13 1904  COLORURINE YELLOW  LABSPEC 1.014  PHURINE 7.5  GLUCOSEU NEGATIVE  HGBUR MODERATE*  BILIRUBINUR NEGATIVE  KETONESUR 15*  PROTEINUR NEGATIVE  UROBILINOGEN 1.0  NITRITE NEGATIVE  LEUKOCYTESUR NEGATIVE    Imaging results:  Dg Chest 2 View  12/18/2013   CLINICAL DATA:  Weakness and unsteadiness.  No chest complaints.  EXAM: CHEST  2 VIEW  COMPARISON:  08/21/2013  FINDINGS: There is no focal parenchymal opacity, pleural effusion, or pneumothorax. Stable cardiomegaly. Stable prominence of the central pulmonary vasculature.  The osseous structures are unremarkable.  IMPRESSION: No active cardiopulmonary disease.   Electronically Signed   By: Kathreen Devoid   On: 12/18/2013 17:15   Ct Head Wo Contrast  12/18/2013   CLINICAL DATA:  Weakness  EXAM: CT HEAD WITHOUT CONTRAST  TECHNIQUE: Contiguous axial images were obtained from the base of the skull through the vertex without intravenous contrast.  COMPARISON:  07/08/2013  FINDINGS: There is a 2.7 cm hyperdense mass arising from the left sphenoid wing most consistent with a meningioma. There is no evidence of midline shift or extra-axial fluid collections. There is no evidence of other space-occupying lesion or intracranial hemorrhage. There is no evidence of a  cortical-based area of acute infarction. There is an old right cerebellar infarct with encephalomalacia. There is generalized cerebral atrophy. There is periventricular white matter low attenuation likely secondary to microangiopathy.  The ventricles and sulci are appropriate for the patient's age. The basal cisterns are  patent.  Visualized portions of the orbits are unremarkable. The visualized portions of the paranasal sinuses and mastoid air cells are unremarkable. Cerebrovascular atherosclerotic calcifications are noted.  The osseous structures are unremarkable.  IMPRESSION: 1. No acute intracranial pathology. 2. Stable left sphenoid wing meningioma.   Electronically Signed   By: Kathreen Devoid   On: 12/18/2013 17:31   Mr Brain Wo Contrast  12/18/2013   CLINICAL DATA:  80 year old male who awoke yesterday and could not stand, falling to the right. Symptoms progressing. Initial encounter.  Personal history of left sphenoid wing meningioma.  EXAM: MRI HEAD WITHOUT CONTRAST  TECHNIQUE: Multiplanar, multiecho pulse sequences of the brain and surrounding structures were obtained without intravenous contrast.  COMPARISON:  Head CT without contrast 1716 hr today. Brain MRI 07/19/2011.  FINDINGS: Expected evolution of the right cerebellar infarct which occurred in 2013, now with widespread right cerebellar encephalomalacia. Underlying chronic bilateral cerebellar infarcts, progressed on the left since 2013.  Major intracranial vascular flow voids are stable.  Small cortically based restricted diffusion in the left occipital pole best seen on series 4, image 15. No other restricted diffusion. This occurs in a an area of cortical encephalomalacia. There are petechial blood products (series 8, image 71) which were not evident in 2013. No mass effect.  Chronic right occipital pole infarct appears stable. Widespread confluent cerebral white matter T2 and FLAIR hyperintensity has not significantly changed. Stable T2 heterogeneity in the deep gray matter. Chronic left sphenoid wing meningioma is stable measuring 2-2.5 cm diameter.  Chronic micro hemorrhages elsewhere in the brain are more numerous, but might in part be visualized to better effect today due to 3 Tesla imaging.  No ventriculomegaly. Negative pituitary, cervicomedullary junction  and visualized cervical spine. Normal bone marrow signal. Visible internal auditory structures appear normal. Visualized paranasal sinuses and mastoids are clear.  New signal changes in the left globe (series 6, image 8). Right orbit soft tissues appear stable and within normal limits.  IMPRESSION: 1. Small acute on chronic infarct in the left occipital pole, left PCA territory. Possible petechial hemorrhage, but no associated mass effect. 2. Advanced chronic ischemic and small vessel disease elsewhere, with progression since 2013. 3. Chronic left sphenoid wing meningioma. 4. New signal changes in the left globe, query recent left vitreous hemorrhage or retinal/choroidal detachment.   Electronically Signed   By: Lars Pinks M.D.   On: 12/18/2013 21:39    Other results: EKG: normal sinus rhythm, occasional PVC noted, unifocal.  Assessment & Plan by Problem: Active Problems:   Gait abnormality   Stroke-like symptoms   #Left occipital pole stroke Acute on chronic.  Unclear if this could cause his abnormal gait and balance issues. History of cerebellar stroke, so instable at baseline. However, increased difficulty ambulating and standing from prior.  Was started on Xarelto for DVTs, but did not start taking initially.  Now, currently using per daughter.  No hemorrhage on CT, but possible petechial hemorrhage on MRI.  Left sphenoid meningioma could potentially be causing R sided weakness, but this is stable on CT from prior, making new neurological defects less likely.  Patient also hypertensive in the ER, suggesting hypertensive encephalopathy could be possible, but symptoms not improved after blood  pressure improved. -Admit to telemetry. -Consult neurology. -Complete MRA to look at flow. -Carotid dopplers. -TTE. -Continue Xarelto per Neurology recs. -Continue home Plavix. -Lipid panel and A1C. -Neuro checks. -Consult speech. -Consult PT/OT.  #Hypertension Blood pressure elevated in ER.  Reported  compliance. -Hold metoprolol in the setting of new stroke.  #Left globe change on MRI Concerning for left vitreous hemorrhage or retinal detachment.  Says he can see "just a hair" in his left eye.  Difficult to examine or elucidate additional history due to hearing impairment.  Spoke with Dr. Jobe Igo of radiology who thought the lesion is most consistent with a vitreous hemorrhage rather than retinal detachment. -Called Dr. Posey Pronto of ophthalmology who felt this was likely macular degeneration. -Recommended eye exam if none performed recently.  #History of DVTs Left leg DVT earlier this year with R leg DVT in 09/2013.  Saw vascular surgery to discuss lysis, which was not recommended.  On chronic Xarelto. -Continue home Xarelto.  #Hyperlipidemia Last lipid panel in 07/20/11 with LDL 91. -Continue home Lipitor. -Check lipid panel.  #Meningioma Stable from prior.  Will follow up with neuro about whether this could be causing his symptoms. -Continue to monitor.  #Normocytic anemia Hemoglobin stable from prior at 12.6.  Last anemia panel normal.  No evidence of bleeding. -Monitor CBC.  #CKD2 Likely due to hypertension. Cr stable at 1.1. -Monitor BMP.  #BPH Status post TURP.  No change in urination. -Monitor urine output.  #Hearing loss Bilateral. Likely presbycusis. -No workup required.  #Foot care Long curled toe nails on feet bilaterally on exam. -Needs podiatry referral.  Dispo: Disposition is deferred at this time, awaiting improvement of current medical problems. Anticipated discharge in approximately 1-2 day(s).   The patient does have a current PCP [name], DO), therefore will be require [facility]PC follow-up after discharge.   The patient does have transportation limitations that hinder transportation to clinic appointments.   Signed:  Arman Filter, MD, PhD PGY-1 Internal Medicine Teaching Service Pager: [phone] 12/19/2013, 1:30 AM

## 2013-12-18 NOTE — ED Provider Notes (Signed)
CSN: [id number]     Arrival date & time 12/18/13  1518 History   First MD Initiated Contact with Patient 12/18/13 1530     Chief Complaint  Patient presents with  . Weakness   Level V caveat due to difficulty hearing  (Consider location/radiation/quality/duration/timing/severity/associated sxs/prior Treatment) Patient is a 80 y.o. male presenting with weakness. The history is provided by the patient and a relative.  Weakness   patient awoke yesterday and had more difficulty standing. He seemed to fall to the right. Today he was even more severe. He's had a previous stroke and walks with a walker at baseline. He is now not able to walk with a walker. He normally stands and will bathe himself, and was able to stand today and had difficulty standing yesterday. His been otherwise doing well per the daughter. his been eating and drinking well. No fevers. Previous stroke and is on Xarelto and Plavix Past Medical History  Diagnosis Date  . Hypertension   . Difficulty hearing, right     BILATERAL HEARING LOSS - BEST TO TRY TO SPEAK INTO LEFT EAR  . Meningioma   . Frequent falls   . BPH (benign prostatic hypertrophy)     TURP 05/19/13  . Chronic kidney disease     CHRONIC KIDNEY DISEASE, 2  . Anemia   . Thrombocytopenia   . Stroke     Cerebellar, 2013; WALKS WITH WALKER, ABLE TO DRESS AND BATHE HIMSELF BUT FAMILY TRIES TO PROVIDE SUPERVISION BECAUSE OF HIS HX OF FALL AND WEAKNESS LEGS, ARMS   . Incontinence of urine     SOME INCONTINENCE  . Hyperlipidemia   . DVT (deep venous thrombosis)    Past Surgical History  Procedure Laterality Date  . Tube put in ear yrs ago    . Transurethral resection of prostate N/A 05/19/2013    Procedure: TRANSURETHRAL RESECTION OF THE PROSTATE WITH GYRUS INSTRUMENTS;  Surgeon: Ailene Rud, MD;  Location: [facility];  Service: Urology;  Laterality: N/A;  . Cystoscopy N/A 06/13/2013    Procedure: CYSTOSCOPY FLEXIBLE BEDSIDE;  Surgeon: Ardis Hughs, MD;   Location: [name];  Service: Urology;  Laterality: N/A;   Family History  Problem Relation Age of Onset  . Diabetes Mother   . Heart disease Mother   . Hypertension Mother   . Heart disease Father   . Hypertension Father   . Hypertension Daughter    History  Substance Use Topics  . Smoking status: Former Smoker    Types: Cigarettes  . Smokeless tobacco: Not on file  . Alcohol Use: No     Comment: QUIT SMOKING 50 YRS AGO    Review of Systems  Unable to perform ROS Neurological: Positive for weakness.      Allergies  Review of patient's allergies indicates no known allergies.  Home Medications   Prior to Admission medications   Medication Sig Start Date End Date Taking? Authorizing Provider  atorvastatin (LIPITOR) 20 MG tablet Take 20 mg by mouth daily at 6 PM.   Yes Historical Provider, MD  Cholecalciferol (VITAMIN D PO) Take 1 tablet by mouth daily.   Yes Historical Provider, MD  clopidogrel (PLAVIX) 75 MG tablet Take 75 mg by mouth daily with breakfast.   Yes Historical Provider, MD  Iron-Vitamins (GERITOL PO) Take 1 tablet by mouth daily.   Yes Historical Provider, MD  metoprolol tartrate (LOPRESSOR) 25 MG tablet Take 25 mg by mouth 2 (two) times daily.   Yes Historical Provider,  MD  omega-3 acid ethyl esters (LOVAZA) 1 G capsule Take 1 capsule (1 g total) by mouth daily. 09/15/13  Yes Tiffany L Reed, DO  rivaroxaban (XARELTO) 20 MG TABS tablet Take 1 tablet (20 mg total) by mouth daily with supper. 09/25/13  Yes Cresenciano Genre, MD  Rivaroxaban (XARELTO) 15 MG TABS tablet Take 1 tablet (15 mg total) by mouth 2 (two) times daily with a meal. 09/10/13 09/24/13  Cresenciano Genre, MD   BP 201/106  Pulse 58  Temp(Src) 97.7 F (36.5 C) (Oral)  Resp 18  Ht 6' (1.829 m)  SpO2 98% Physical Exam  Constitutional: He appears well-developed and well-nourished.  HENT:  Head: Normocephalic.  Eyes: EOM are normal. Pupils are equal, round, and reactive to light.  Neck: Neck supple.   Cardiovascular: Normal rate and regular rhythm.   Pulmonary/Chest: Effort normal.  Abdominal: Bowel sounds are normal.  Neurological: He is alert.  Patient is alert, but does have difficulty understanding. Good grip strength bilaterally. Finger-nose intact on left but off on right. Better straight leg raise and hold on left than on the right. Good flexion and extension ankle. Face is symmetric. Extraocular movements intact    ED Course  Procedures (including critical care time) Labs Review Labs Reviewed  ETHANOL  PROTIME-INR  APTT  CBC  DIFFERENTIAL  COMPREHENSIVE METABOLIC PANEL  URINE RAPID DRUG SCREEN (HOSP PERFORMED)  URINALYSIS, ROUTINE W REFLEX MICROSCOPIC  I-STAT CHEM 8, ED  I-STAT TROPOININ, ED    Imaging Review No results found.   EKG Interpretation   Date/Time:  Thursday December 18 2013 15:23:51 EDT Ventricular Rate:  75 PR Interval:  170 QRS Duration: 108 QT Interval:  390 QTC Calculation: 435 R Axis:   -10 Text Interpretation:  Sinus rhythm with occasional Premature ventricular  complexes Left ventricular hypertrophy Nonspecific ST abnormality Abnormal  ECG Confirmed by Alvino Chapel  MD, Ovid Curd [phone]) on 12/18/2013 4:13:36 PM      MDM   Final diagnoses:  None    Patient with difficulty standing and right-sided findings. Somewhat difficult exam due to previous stroke with right-sided involvement. A TPA candidate due to last normal, which was the day before yesterday. CT scan pending and will likely require admission. Stroke rule out versus intra- cranial hemorrhage with the patient's anticoagulation as a risk    Ovid Curd R. Alvino Chapel, MD 12/18/13 [phone]

## 2013-12-18 NOTE — ED Notes (Addendum)
Family reports she noticed pt leaning towards the Right when he would stand. Today it is worse. Family also sts speech is slightly more slurred than usual. Pt with pmh of stroke. Grip strengths equal, no facial droop. Pt vomited x1 in triage.

## 2013-12-18 NOTE — Consult Note (Signed)
Referring Physician: Dr. Melanee Left    Chief Complaint: unsteadiness, increasing dysarthria.  HPI:                                                                                                                                         [name] is an 80 y.o. male with a past medical history significant for HTN, hyperlipidemia, cerebellar stroke in 2013 with residual imbalance, DVT s/p filter placement currently on xarelto, frequent falls, brought in by family due to increasing imbalance since yesterday. [name] has trouble hearing, so history was obtained from him and his daughter Daughter is at the bedside and said that his father has some trouble with balance since his stroke but for the most part is typically able to stand and walk. However, yesterday she started noticing increasing difficulty with balance with difficulty standing and a tendency to lean to the right.She also reports that his speech is more slurred than usual.No associated vertigo, double vision, difficulty swallowing, HA, language or vision disturbances. CT brain showed no acute abnormality but a subsequent MRI brain revealed a small acute on chronic infarct in the left occipital pole, left PCA territory. Possible petechial hemorrhage, but no associated mass effect. Patient is on xarelto and plavix. Date last known well: 12/17/13 Time last known well: uncertain  tPA Given: no, out of the window   Past Medical History  Diagnosis Date  . Hypertension   . Difficulty hearing, right     BILATERAL HEARING LOSS - BEST TO TRY TO SPEAK INTO LEFT EAR  . Meningioma   . Frequent falls   . BPH (benign prostatic hypertrophy)     TURP 05/19/13  . Chronic kidney disease     CHRONIC KIDNEY DISEASE, 2  . Anemia   . Thrombocytopenia   . Stroke     Cerebellar, 2013; WALKS WITH WALKER, ABLE TO DRESS AND BATHE HIMSELF BUT FAMILY TRIES TO PROVIDE SUPERVISION BECAUSE OF HIS HX OF FALL AND WEAKNESS LEGS, ARMS   . Incontinence of urine     SOME INCONTINENCE  . Hyperlipidemia   . DVT (deep venous thrombosis)     Past Surgical History  Procedure Laterality Date  . Tube put in ear yrs ago    . Transurethral resection of prostate N/A 05/19/2013    Procedure: TRANSURETHRAL RESECTION OF THE PROSTATE WITH GYRUS INSTRUMENTS;  Surgeon: Ailene Rud, MD;  Location: [facility];  Service: Urology;  Laterality: N/A;  . Cystoscopy N/A 06/13/2013    Procedure: CYSTOSCOPY FLEXIBLE BEDSIDE;  Surgeon: Ardis Hughs, MD;  Location: [facility];  Service: Urology;  Laterality: N/A;    Family History  Problem Relation Age of Onset  . Diabetes Mother   . Heart disease Mother   . Hypertension Mother   . Heart disease Father   . Hypertension Father   . Hypertension Daughter    Social History:  reports that he has quit smoking. His smoking use included Cigarettes. He smoked 0.00 packs per day. He does not have any smokeless tobacco history on file. He reports that he does not drink alcohol or use illicit drugs.  Allergies: No Known Allergies  Medications:                                                                                                                           I have reviewed the patient's current medications.  ROS:                                                                                                                                       History obtained from chart review and daughter.  General ROS: negative for - chills, fever, night sweats, weight gain or weight loss Psychological ROS: negative for - behavioral disorder, hallucinations, memory difficulties, mood swings or suicidal ideation Ophthalmic ROS: negative for - blurry vision, double vision, eye pain or loss of vision ENT ROS: negative for - epistaxis, nasal discharge, oral lesions, sore throat, tinnitus or vertigo Allergy and Immunology ROS: negative for - hives or itchy/watery eyes Hematological and Lymphatic ROS: negative for - bleeding problems,  bruising or swollen lymph nodes Endocrine ROS: negative for - galactorrhea, hair pattern changes, polydipsia/polyuria or temperature intolerance Respiratory ROS: negative for - cough, hemoptysis, shortness of breath or wheezing Cardiovascular ROS: negative for - chest pain, dyspnea on exertion, edema or irregular heartbeat Gastrointestinal ROS: negative for - abdominal pain, diarrhea, hematemesis, nausea/vomiting or stool incontinence Genito-Urinary ROS: negative for - dysuria, hematuria, incontinence or urinary frequency/urgency Musculoskeletal ROS: negative for - joint swelling or muscular weakness Neurological ROS: as noted in HPI Dermatological ROS: negative for rash and skin lesion changes  Physical exam: pleasant male in no apparent distress. Blood pressure 183/89, pulse 54, temperature 98.1 F (36.7 C), temperature source Oral, resp. rate 17, height 6' (1.829 m), SpO2 97.00%. Head: normocephalic. Neck: supple, no bruits, no JVD. Cardiac: no murmurs. Lungs: clear. Abdomen: soft, no tender, no mass. Extremities: no edema. Neurologic Examination:  General: Mental Status: Alert, oriented to place and person, thought content appropriate.  Mild dysarthria without evidence of aphasia.  Able to follow 3 step commands without difficulty. Cranial Nerves: II: Discs flat bilaterally; Visual fields grossly normal, pupils equal, round, reactive to light III,IV, VI: ptosis not present, extra-ocular motions intact bilaterally V,VII: smile symmetric, facial light touch sensation normal bilaterally VIII: hearing normal bilaterally IX,X: gag reflex present XI: bilateral shoulder shrug XII: midline tongue extension without atrophy or fasciculations Motor: Right : Upper extremity   5/5    Left:     Upper extremity   5/5  Lower extremity   5/5     Lower extremity   5/5 Tone and bulk:normal tone  throughout; no atrophy noted Sensory: Pinprick and light touch intact throughout, bilaterally Deep Tendon Reflexes:  1+ all over Plantars: Right: downgoing   Left: downgoing Cerebellar: normal finger-to-nose,  normal heel-to-shin test Gait:  No tested for safety reasons CV: pulses palpable throughout    Results for orders placed during the hospital encounter of 12/18/13 (from the past 48 hour(s))  ETHANOL     Status: None   Collection Time    12/18/13  5:32 PM      Result Value Ref Range   Alcohol, Ethyl (B) <11  0 - 11 mg/dL   Comment:            LOWEST DETECTABLE LIMIT FOR     SERUM ALCOHOL IS 11 mg/dL     FOR MEDICAL PURPOSES ONLY  PROTIME-INR     Status: None   Collection Time    12/18/13  5:32 PM      Result Value Ref Range   Prothrombin Time 14.4  11.6 - 15.2 seconds   INR 1.12  0.00 - 1.49  APTT     Status: None   Collection Time    12/18/13  5:32 PM      Result Value Ref Range   aPTT 32  24 - 37 seconds  CBC     Status: Abnormal   Collection Time    12/18/13  5:32 PM      Result Value Ref Range   WBC 6.8  4.0 - 10.5 K/uL   RBC 4.03 (*) 4.22 - 5.81 MIL/uL   Hemoglobin 11.9 (*) 13.0 - 17.0 g/dL   HCT 36.1 (*) 39.0 - 52.0 %   MCV 89.6  78.0 - 100.0 fL   MCH 29.5  26.0 - 34.0 pg   MCHC 33.0  30.0 - 36.0 g/dL   RDW 14.7  11.5 - 15.5 %   Platelets 152  150 - 400 K/uL  DIFFERENTIAL     Status: None   Collection Time    12/18/13  5:32 PM      Result Value Ref Range   Neutrophils Relative % 72  43 - 77 %   Neutro Abs 5.0  1.7 - 7.7 K/uL   Lymphocytes Relative 19  12 - 46 %   Lymphs Abs 1.3  0.7 - 4.0 K/uL   Monocytes Relative 7  3 - 12 %   Monocytes Absolute 0.5  0.1 - 1.0 K/uL   Eosinophils Relative 2  0 - 5 %   Eosinophils Absolute 0.1  0.0 - 0.7 K/uL   Basophils Relative 0  0 - 1 %   Basophils Absolute 0.0  0.0 - 0.1 K/uL  COMPREHENSIVE METABOLIC PANEL     Status: Abnormal   Collection Time    12/18/13  5:32  PM      Result Value Ref Range   Sodium  143  137 - 147 mEq/L   Potassium 4.1  3.7 - 5.3 mEq/L   Chloride 104  96 - 112 mEq/L   CO2 25  19 - 32 mEq/L   Glucose, Bld 100 (*) 70 - 99 mg/dL   BUN 16  6 - 23 mg/dL   Creatinine, Ser 1.02  0.50 - 1.35 mg/dL   Calcium 10.5  8.4 - 10.5 mg/dL   Total Protein 7.8  6.0 - 8.3 g/dL   Albumin 3.7  3.5 - 5.2 g/dL   AST 19  0 - 37 U/L   ALT 8  0 - 53 U/L   Alkaline Phosphatase 74  39 - 117 U/L   Total Bilirubin 0.5  0.3 - 1.2 mg/dL   GFR calc non Af Amer 64 (*) >90 mL/min   GFR calc Af Amer 74 (*) >90 mL/min   Comment: (NOTE)     The eGFR has been calculated using the CKD EPI equation.     This calculation has not been validated in all clinical situations.     eGFR's persistently <90 mL/min signify possible Chronic Kidney     Disease.   Anion gap 14  5 - 15  I-STAT TROPOININ, ED     Status: None   Collection Time    12/18/13  5:48 PM      Result Value Ref Range   Troponin i, poc 0.01  0.00 - 0.08 ng/mL   Comment 3            Comment: Due to the release kinetics of cTnI,     a negative result within the first hours     of the onset of symptoms does not rule out     myocardial infarction with certainty.     If myocardial infarction is still suspected,     repeat the test at appropriate intervals.  I-STAT CHEM 8, ED     Status: Abnormal   Collection Time    12/18/13  5:50 PM      Result Value Ref Range   Sodium 141  137 - 147 mEq/L   Potassium 3.8  3.7 - 5.3 mEq/L   Chloride 110  96 - 112 mEq/L   BUN 15  6 - 23 mg/dL   Creatinine, Ser 1.10  0.50 - 1.35 mg/dL   Glucose, Bld 104 (*) 70 - 99 mg/dL   Calcium, Ion 1.29  1.13 - 1.30 mmol/L   TCO2 28  0 - 100 mmol/L   Hemoglobin 12.6 (*) 13.0 - 17.0 g/dL   HCT 37.0 (*) 39.0 - 52.0 %  URINE RAPID DRUG SCREEN (HOSP PERFORMED)     Status: None   Collection Time    12/18/13  7:04 PM      Result Value Ref Range   Opiates NONE DETECTED  NONE DETECTED   Cocaine NONE DETECTED  NONE DETECTED   Benzodiazepines NONE DETECTED  NONE DETECTED    Amphetamines NONE DETECTED  NONE DETECTED   Tetrahydrocannabinol NONE DETECTED  NONE DETECTED   Barbiturates NONE DETECTED  NONE DETECTED   Comment:            DRUG SCREEN FOR MEDICAL PURPOSES     ONLY.  IF CONFIRMATION IS NEEDED     FOR ANY PURPOSE, NOTIFY LAB     WITHIN 5 DAYS.  LOWEST DETECTABLE LIMITS     FOR URINE DRUG SCREEN     Drug Class       Cutoff (ng/mL)     Amphetamine      1000     Barbiturate      200     Benzodiazepine   071     Tricyclics       219     Opiates          300     Cocaine          300     THC              50  URINALYSIS, ROUTINE W REFLEX MICROSCOPIC     Status: Abnormal   Collection Time    12/18/13  7:04 PM      Result Value Ref Range   Color, Urine YELLOW  YELLOW   APPearance CLOUDY (*) CLEAR   Specific Gravity, Urine 1.014  1.005 - 1.030   pH 7.5  5.0 - 8.0   Glucose, UA NEGATIVE  NEGATIVE mg/dL   Hgb urine dipstick MODERATE (*) NEGATIVE   Bilirubin Urine NEGATIVE  NEGATIVE   Ketones, ur 15 (*) NEGATIVE mg/dL   Protein, ur NEGATIVE  NEGATIVE mg/dL   Urobilinogen, UA 1.0  0.0 - 1.0 mg/dL   Nitrite NEGATIVE  NEGATIVE   Leukocytes, UA NEGATIVE  NEGATIVE  URINE MICROSCOPIC-ADD ON     Status: Abnormal   Collection Time    12/18/13  7:04 PM      Result Value Ref Range   Squamous Epithelial / LPF FEW (*) RARE   WBC, UA 7-10  <3 WBC/hpf   Bacteria, UA MANY (*) RARE   Urine-Other AMORPHOUS URATES/PHOSPHATES     Dg Chest 2 View  12/18/2013   CLINICAL DATA:  Weakness and unsteadiness.  No chest complaints.  EXAM: CHEST  2 VIEW  COMPARISON:  08/21/2013  FINDINGS: There is no focal parenchymal opacity, pleural effusion, or pneumothorax. Stable cardiomegaly. Stable prominence of the central pulmonary vasculature.  The osseous structures are unremarkable.  IMPRESSION: No active cardiopulmonary disease.   Electronically Signed   By: Kathreen Devoid   On: 12/18/2013 17:15   Ct Head Wo Contrast  12/18/2013   CLINICAL DATA:  Weakness  EXAM:  CT HEAD WITHOUT CONTRAST  TECHNIQUE: Contiguous axial images were obtained from the base of the skull through the vertex without intravenous contrast.  COMPARISON:  07/08/2013  FINDINGS: There is a 2.7 cm hyperdense mass arising from the left sphenoid wing most consistent with a meningioma. There is no evidence of midline shift or extra-axial fluid collections. There is no evidence of other space-occupying lesion or intracranial hemorrhage. There is no evidence of a cortical-based area of acute infarction. There is an old right cerebellar infarct with encephalomalacia. There is generalized cerebral atrophy. There is periventricular white matter low attenuation likely secondary to microangiopathy.  The ventricles and sulci are appropriate for the patient's age. The basal cisterns are patent.  Visualized portions of the orbits are unremarkable. The visualized portions of the paranasal sinuses and mastoid air cells are unremarkable. Cerebrovascular atherosclerotic calcifications are noted.  The osseous structures are unremarkable.  IMPRESSION: 1. No acute intracranial pathology. 2. Stable left sphenoid wing meningioma.   Electronically Signed   By: Kathreen Devoid   On: 12/18/2013 17:31   Mr Brain Wo Contrast  12/18/2013   CLINICAL DATA:  80 year old male who awoke yesterday and could not stand,  falling to the right. Symptoms progressing. Initial encounter.  Personal history of left sphenoid wing meningioma.  EXAM: MRI HEAD WITHOUT CONTRAST  TECHNIQUE: Multiplanar, multiecho pulse sequences of the brain and surrounding structures were obtained without intravenous contrast.  COMPARISON:  Head CT without contrast 1716 hr today. Brain MRI 07/19/2011.  FINDINGS: Expected evolution of the right cerebellar infarct which occurred in 2013, now with widespread right cerebellar encephalomalacia. Underlying chronic bilateral cerebellar infarcts, progressed on the left since 2013.  Major intracranial vascular flow voids are  stable.  Small cortically based restricted diffusion in the left occipital pole best seen on series 4, image 15. No other restricted diffusion. This occurs in a an area of cortical encephalomalacia. There are petechial blood products (series 8, image 71) which were not evident in 2013. No mass effect.  Chronic right occipital pole infarct appears stable. Widespread confluent cerebral white matter T2 and FLAIR hyperintensity has not significantly changed. Stable T2 heterogeneity in the deep gray matter. Chronic left sphenoid wing meningioma is stable measuring 2-2.5 cm diameter.  Chronic micro hemorrhages elsewhere in the brain are more numerous, but might in part be visualized to better effect today due to 3 Tesla imaging.  No ventriculomegaly. Negative pituitary, cervicomedullary junction and visualized cervical spine. Normal bone marrow signal. Visible internal auditory structures appear normal. Visualized paranasal sinuses and mastoids are clear.  New signal changes in the left globe (series 6, image 8). Right orbit soft tissues appear stable and within normal limits.  IMPRESSION: 1. Small acute on chronic infarct in the left occipital pole, left PCA territory. Possible petechial hemorrhage, but no associated mass effect. 2. Advanced chronic ischemic and small vessel disease elsewhere, with progression since 2013. 3. Chronic left sphenoid wing meningioma. 4. New signal changes in the left globe, query recent left vitreous hemorrhage or retinal/choroidal detachment.   Electronically Signed   By: Lars Pinks M.D.   On: 12/18/2013 21:39    Assessment: 80 y.o. male brought in due to increasing unsteadiness and questionable worsening dysarthria. MRI brain revealed a small acute on chronic infarct in the left occipital pole, left PCA territory which may not necessarily correspond with patient's preentation. Agree with completing stroke work up. Continue xarelto. Stroke team will follow up tomorrow.    Stroke  Risk Factors - age, HTN, hyperlipidemia, prior stroke  Plan: 1. HgbA1c, fasting lipid panel 2. MRI, MRA  of the brain without contrast 3. Echocardiogram 4. Carotid dopplers 5. Prophylactic therapy-xarelto 6. Risk factor modification 7. Telemetry monitoring 8. Frequent neuro checks 9. PT/OT SLP  Dorian Pod, MD [facility] [phone]  12/18/2013, 10:03 PM

## 2013-12-18 NOTE — ED Provider Notes (Signed)
Care assumed from Dr. Alvino Chapel. Patient with difficulty walking history of stroke on anticoagulation.  CT head shows no hemorrhage. Stable meningioma.  MRI will be ordered. Patient will be admitted to internal medicine teaching service.  permissve hypertension allowed.  BP 183/89  Pulse 54  Temp(Src) 98.1 F (36.7 C) (Oral)  Resp 17  Ht 6' (1.829 m)  SpO2 97%   Ezequiel Essex, MD 12/18/13 2156

## 2013-12-18 NOTE — Telephone Encounter (Signed)
Daughter called and states father lives with her. Has noted balance is off today and always lending to the right. No other changes noted from previous CVA. Suggest to get pt to ER - may suggest to call 911 due to balance off. Hilda Blades Akeira Lahm RN 12/18/13 11:45AM

## 2013-12-19 ENCOUNTER — Observation Stay (HOSPITAL_COMMUNITY): Payer: Medicare Other

## 2013-12-19 DIAGNOSIS — I129 Hypertensive chronic kidney disease with stage 1 through stage 4 chronic kidney disease, or unspecified chronic kidney disease: Secondary | ICD-10-CM | POA: Diagnosis present

## 2013-12-19 DIAGNOSIS — Z8249 Family history of ischemic heart disease and other diseases of the circulatory system: Secondary | ICD-10-CM | POA: Diagnosis not present

## 2013-12-19 DIAGNOSIS — Z7901 Long term (current) use of anticoagulants: Secondary | ICD-10-CM | POA: Diagnosis not present

## 2013-12-19 DIAGNOSIS — H4312 Vitreous hemorrhage, left eye: Secondary | ICD-10-CM | POA: Diagnosis present

## 2013-12-19 DIAGNOSIS — I10 Essential (primary) hypertension: Secondary | ICD-10-CM

## 2013-12-19 DIAGNOSIS — I6340 Cerebral infarction due to embolism of unspecified cerebral artery: Secondary | ICD-10-CM | POA: Insufficient documentation

## 2013-12-19 DIAGNOSIS — D649 Anemia, unspecified: Secondary | ICD-10-CM

## 2013-12-19 DIAGNOSIS — Z87891 Personal history of nicotine dependence: Secondary | ICD-10-CM | POA: Diagnosis not present

## 2013-12-19 DIAGNOSIS — R471 Dysarthria and anarthria: Secondary | ICD-10-CM | POA: Diagnosis present

## 2013-12-19 DIAGNOSIS — N182 Chronic kidney disease, stage 2 (mild): Secondary | ICD-10-CM | POA: Diagnosis present

## 2013-12-19 DIAGNOSIS — R269 Unspecified abnormalities of gait and mobility: Secondary | ICD-10-CM

## 2013-12-19 DIAGNOSIS — E785 Hyperlipidemia, unspecified: Secondary | ICD-10-CM

## 2013-12-19 DIAGNOSIS — D329 Benign neoplasm of meninges, unspecified: Secondary | ICD-10-CM | POA: Diagnosis present

## 2013-12-19 DIAGNOSIS — H9193 Unspecified hearing loss, bilateral: Secondary | ICD-10-CM

## 2013-12-19 DIAGNOSIS — I639 Cerebral infarction, unspecified: Secondary | ICD-10-CM

## 2013-12-19 DIAGNOSIS — H9190 Unspecified hearing loss, unspecified ear: Secondary | ICD-10-CM | POA: Diagnosis present

## 2013-12-19 DIAGNOSIS — N400 Enlarged prostate without lower urinary tract symptoms: Secondary | ICD-10-CM | POA: Diagnosis present

## 2013-12-19 DIAGNOSIS — R270 Ataxia, unspecified: Secondary | ICD-10-CM

## 2013-12-19 DIAGNOSIS — I69351 Hemiplegia and hemiparesis following cerebral infarction affecting right dominant side: Secondary | ICD-10-CM | POA: Diagnosis not present

## 2013-12-19 DIAGNOSIS — B351 Tinea unguium: Secondary | ICD-10-CM | POA: Diagnosis present

## 2013-12-19 DIAGNOSIS — Z86718 Personal history of other venous thrombosis and embolism: Secondary | ICD-10-CM | POA: Diagnosis not present

## 2013-12-19 DIAGNOSIS — I739 Peripheral vascular disease, unspecified: Secondary | ICD-10-CM | POA: Diagnosis present

## 2013-12-19 HISTORY — DX: Cerebral infarction due to embolism of unspecified cerebral artery: I63.40

## 2013-12-19 LAB — HEMOGLOBIN A1C
Hgb A1c MFr Bld: 5.8 % — ABNORMAL HIGH (ref <5.7)
Mean Plasma Glucose: 120 mg/dL — ABNORMAL HIGH (ref <117)

## 2013-12-19 LAB — LIPID PANEL
Cholesterol: 119 mg/dL (ref 0–200)
HDL: 56 mg/dL (ref >39)
LDL Cholesterol: 54 mg/dL (ref 0–99)
Total CHOL/HDL Ratio: 2.1 RATIO
Triglycerides: 45 mg/dL (ref <150)
VLDL: 9 mg/dL (ref 0–40)

## 2013-12-19 LAB — CK: Total CK: 66 U/L (ref 7–232)

## 2013-12-19 MED ORDER — ENOXAPARIN SODIUM 40 MG/0.4ML ~~LOC~~ SOLN
40.0000 mg | SUBCUTANEOUS | Status: DC
  Administered 2013-12-19 – 2013-12-20 (×2): 40 mg via SUBCUTANEOUS
  Filled 2013-12-19 (×3): qty 0.4

## 2013-12-19 NOTE — Consult Note (Signed)
  Ophthalmology Consult  This is a 80 yo gentleman admitted for possible CVA who was found to have vitreous opacities in the left eye  on MRI.  Pt had no visual complaints.  Once asked he did say his vision was poor in his left eye but did not know for how long.  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  No h/o ocular surgery.  Allergies: NKDA  Exam:  On exam pt was found to be 20/40 right eye and HM superiorly left eye.  Possible mild APD in the left eye (difficult to evaluate due to bright room from Window) however good reaction both eyes.  Extraocular motility intact, Visual field full to confrontation in the right eye. Unable to evaluate in the left eye.  IOP was 15 od and 16 os.  Pt with dermatochalasis ou, conjunctiva and cornea within normal limits with arcus seniles ou.  Iris reactive/round in both eyes. Pt with nuclear sclerosis in both eyes.  On dilated exam c/d was 0.4 in the right eye and retina was flat and intact.  There was no view in the left eye except superiorly and retina appeared flat superiorly.  There was a vitreous hemorrhage of unknown age in the left eye obscuring view of retina.    A/P 1.Vitreous hemorrhage left eye:  Unsure of timing since pt had no visual complaints before diagnosis.  I did track down his last eye exam which was with local optometrist  in 2012.  Pt had good vision in both eyes at that time.  Pt can start lovenox for DVT prophylaxis.  Vit heme does not appear very recent so risk of rebleed would be rare and risk of DVT outweighs risk of vitreous rebleed.  Pt needs to see retina specialist once discharged from hospital.  I spoke with Dr Zadie Rhine who will see pt in the beginning of next week at his office.  Pt to sleep with head of bed elevated to allow vit heme to settle and possibly improve his vision.   2.Cataracts both eyes: to be evaluated as outpatient.  Thank you for allowing me to participate in the care of this patient.  Please feel free to contact me if you have any concerns.  Darleen Crocker, M.D. Cell [phone] Office [phone]

## 2013-12-19 NOTE — Progress Notes (Signed)
UR completed 

## 2013-12-19 NOTE — Progress Notes (Signed)
Dr. Talbert Forest, opthomologist stated it with ok to go ahead and give lovenox

## 2013-12-19 NOTE — Progress Notes (Signed)
Subjective: Unable to express much complain. Very HOH. Cannot explain what symptoms he is having with vision. Denied any pain.   Objective: Vital signs in last 24 hours: Filed Vitals:   12/19/13 0643 12/19/13 0900 12/19/13 0930 12/19/13 1130  BP: 127/66  149/89 179/91  Pulse: 70 71 71 68  Temp:      TempSrc:      Resp:   18 18  Height:      Weight: 76.794 kg (169 lb 4.8 oz)     SpO2: 97%   96%   Weight change:   Intake/Output Summary (Last 24 hours) at 12/19/13 1211 Last data filed at 12/18/13 1904  Gross per 24 hour  Intake      0 ml  Output    350 ml  Net   -350 ml   Vitals reviewed. General: resting in bed, NAD HEENT: PERRL, EOMI, no scleral icterus. Attempted ophtho exam, may have lens placement because unable to visualize as clarity was low. Cardiac: RRR, no rubs, murmurs or gallops Pulm: clear to auscultation bilaterally, no wheezes, rales, or rhonchi Abd: soft, nontender, nondistended, BS present Ext: warm and well perfused, no pedal edema Neuro: alert, hard to do exam due to hearing problem, cranial nerves II-XII grossly intact, strength and sensation to light touch equal in bilateral upper and lower extremities. strenght 5/5 everywhere.  Lab Results: Basic Metabolic Panel:  Recent Labs Lab 12/18/13 1732 12/18/13 1750  NA 143 141  K 4.1 3.8  CL 104 110  CO2 25  --   GLUCOSE 100* 104*  BUN 16 15  CREATININE 1.02 1.10  CALCIUM 10.5  --    Liver Function Tests:  Recent Labs Lab 12/18/13 1732  AST 19  ALT 8  ALKPHOS 74  BILITOT 0.5  PROT 7.8  ALBUMIN 3.7   No results found for this basename: LIPASE, AMYLASE,  in the last 168 hours No results found for this basename: AMMONIA,  in the last 168 hours CBC:  Recent Labs Lab 12/18/13 1732 12/18/13 1750  WBC 6.8  --   NEUTROABS 5.0  --   HGB 11.9* 12.6*  HCT 36.1* 37.0*  MCV 89.6  --   PLT 152  --    Cardiac Enzymes: No results found for this basename: CKTOTAL, CKMB, CKMBINDEX,  TROPONINI,  in the last 168 hours BNP: No results found for this basename: PROBNP,  in the last 168 hours D-Dimer: No results found for this basename: DDIMER,  in the last 168 hours CBG: No results found for this basename: GLUCAP,  in the last 168 hours Hemoglobin A1C:  Recent Labs Lab 12/19/13 0427  HGBA1C 5.8*   Fasting Lipid Panel:  Recent Labs Lab 12/19/13 0427  CHOL 119  HDL 56  LDLCALC 54  TRIG 45  CHOLHDL 2.1   Thyroid Function Tests: No results found for this basename: TSH, T4TOTAL, FREET4, T3FREE, THYROIDAB,  in the last 168 hours Coagulation:  Recent Labs Lab 12/18/13 1732  LABPROT 14.4  INR 1.12   Anemia Panel: No results found for this basename: VITAMINB12, FOLATE, FERRITIN, TIBC, IRON, RETICCTPCT,  in the last 168 hours Urine Drug Screen: Drugs of Abuse     Component Value Date/Time   LABOPIA NONE DETECTED 12/18/2013 1904   COCAINSCRNUR NONE DETECTED 12/18/2013 1904   LABBENZ NONE DETECTED 12/18/2013 1904   AMPHETMU NONE DETECTED 12/18/2013 1904   THCU NONE DETECTED 12/18/2013 1904   LABBARB NONE DETECTED 12/18/2013 1904    Alcohol Level:  Recent Labs Lab 12/18/13 1732  ETH <11   Urinalysis:  Recent Labs Lab 12/18/13 1904  COLORURINE YELLOW  LABSPEC 1.014  PHURINE 7.5  GLUCOSEU NEGATIVE  HGBUR MODERATE*  BILIRUBINUR NEGATIVE  KETONESUR 15*  PROTEINUR NEGATIVE  UROBILINOGEN 1.0  NITRITE NEGATIVE  LEUKOCYTESUR NEGATIVE   Misc. Labs:  Micro Results: No results found for this or any previous visit (from the past 240 hour(s)). Studies/Results: Dg Chest 2 View  12/18/2013   CLINICAL DATA:  Weakness and unsteadiness.  No chest complaints.  EXAM: CHEST  2 VIEW  COMPARISON:  08/21/2013  FINDINGS: There is no focal parenchymal opacity, pleural effusion, or pneumothorax. Stable cardiomegaly. Stable prominence of the central pulmonary vasculature.  The osseous structures are unremarkable.  IMPRESSION: No active cardiopulmonary disease.    Electronically Signed   By: Kathreen Devoid   On: 12/18/2013 17:15   Ct Head Wo Contrast  12/18/2013   CLINICAL DATA:  Weakness  EXAM: CT HEAD WITHOUT CONTRAST  TECHNIQUE: Contiguous axial images were obtained from the base of the skull through the vertex without intravenous contrast.  COMPARISON:  07/08/2013  FINDINGS: There is a 2.7 cm hyperdense mass arising from the left sphenoid wing most consistent with a meningioma. There is no evidence of midline shift or extra-axial fluid collections. There is no evidence of other space-occupying lesion or intracranial hemorrhage. There is no evidence of a cortical-based area of acute infarction. There is an old right cerebellar infarct with encephalomalacia. There is generalized cerebral atrophy. There is periventricular white matter low attenuation likely secondary to microangiopathy.  The ventricles and sulci are appropriate for the patient's age. The basal cisterns are patent.  Visualized portions of the orbits are unremarkable. The visualized portions of the paranasal sinuses and mastoid air cells are unremarkable. Cerebrovascular atherosclerotic calcifications are noted.  The osseous structures are unremarkable.  IMPRESSION: 1. No acute intracranial pathology. 2. Stable left sphenoid wing meningioma.   Electronically Signed   By: Kathreen Devoid   On: 12/18/2013 17:31   Mr Brain Wo Contrast  12/18/2013   CLINICAL DATA:  80 year old male who awoke yesterday and could not stand, falling to the right. Symptoms progressing. Initial encounter.  Personal history of left sphenoid wing meningioma.  EXAM: MRI HEAD WITHOUT CONTRAST  TECHNIQUE: Multiplanar, multiecho pulse sequences of the brain and surrounding structures were obtained without intravenous contrast.  COMPARISON:  Head CT without contrast 1716 hr today. Brain MRI 07/19/2011.  FINDINGS: Expected evolution of the right cerebellar infarct which occurred in 2013, now with widespread right cerebellar encephalomalacia.  Underlying chronic bilateral cerebellar infarcts, progressed on the left since 2013.  Major intracranial vascular flow voids are stable.  Small cortically based restricted diffusion in the left occipital pole best seen on series 4, image 15. No other restricted diffusion. This occurs in a an area of cortical encephalomalacia. There are petechial blood products (series 8, image 71) which were not evident in 2013. No mass effect.  Chronic right occipital pole infarct appears stable. Widespread confluent cerebral white matter T2 and FLAIR hyperintensity has not significantly changed. Stable T2 heterogeneity in the deep gray matter. Chronic left sphenoid wing meningioma is stable measuring 2-2.5 cm diameter.  Chronic micro hemorrhages elsewhere in the brain are more numerous, but might in part be visualized to better effect today due to 3 Tesla imaging.  No ventriculomegaly. Negative pituitary, cervicomedullary junction and visualized cervical spine. Normal bone marrow signal. Visible internal auditory structures appear normal. Visualized paranasal sinuses and mastoids  are clear.  New signal changes in the left globe (series 6, image 8). Right orbit soft tissues appear stable and within normal limits.  IMPRESSION: 1. Small acute on chronic infarct in the left occipital pole, left PCA territory. Possible petechial hemorrhage, but no associated mass effect. 2. Advanced chronic ischemic and small vessel disease elsewhere, with progression since 2013. 3. Chronic left sphenoid wing meningioma. 4. New signal changes in the left globe, query recent left vitreous hemorrhage or retinal/choroidal detachment.   Electronically Signed   By: Lars Pinks M.D.   On: 12/18/2013 21:39   Medications: I have reviewed the patient's current medications. Scheduled Meds: .  stroke: mapping our early stages of recovery book   Does not apply Once   Continuous Infusions:  PRN Meds:. 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   Gait abnormality with slurred speech - suspicious for stroke - need full stroke eval  - CT normal but MRI showed small acute on chronic infarct of left occipital pole, left PCA territory, left globe ?viterous hemorrhage/retinal detachment.  - has high risk of hemorrhage given he is on xarelto for recurrent DVT and plavix for previous CVA - ECHO, carotid dopler, MRA brain -neuro checks, telemetry. Risk factor modification.  - PT/OT SLP  -hgba1c 5.8. LDL 54, HDL 56   #Left globe change on MRI  Concerning for left vitreous hemorrhage or retinal detachment. Unable to get clear hx of visual deficit.  -Called Dr. Posey Pronto of ophthalmology who felt this was likely macular degeneration. Called today again unable to reach. Will try to get them to see him here.  Hypertension  Blood pressure elevated in ER. Reported compliance.  -Hold metoprolol in the setting of new stroke. Allow permissive HTN.   Onychomycosis - Needs podiatry outpatient   Dispo: Disposition is deferred at this time, awaiting improvement of current medical problems.  Anticipated discharge in approximately 2-3 day(s).   The patient does have a current PCP [name], DO) and does need an [facility] hospital follow-up appointment after discharge.  The patient does have transportation limitations that hinder transportation to clinic appointments.  .Services Needed at time of discharge: Y = Yes, Blank = No PT:   OT:   RN:   Equipment:   Other:     LOS: 1 day   Dellia Nims, MD 12/19/2013, 12:11 PM

## 2013-12-19 NOTE — H&P (Signed)
Medicine attending admission note: I personally interviewed and examined this patient and reviewed the lab database and pertinent radiographs. I concur with the evaluation and management plan as recorded by resident physician Dr. Karle Starch Moding.  Clinical history: Pleasant 80 year old man with history of prior cerebellar stroke in 2013. He has advanced sensorineural hearing loss. Essential hypertension. Hyperlipidemia. 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 He was brought to the emergency department by his daughter when there was a sudden change in his status. He became ataxic, he was falling to the right side, he was dragging his right leg. He does require a lot of assistance at home but has been able to ambulate with a walker since his prior stroke.  On initial exam blood pressure 201/106. He was alert and oriented but extremely hard of hearing. No focal neurologic deficits except for decreased hearing and vision. He has a left hearing aid. Upper body coordination normal. Gait not tested.  CT scan of the brain showed no acute abnormalities. There is a stable meningioma in the area of the left sphenoid wing. MRI showed a small acute on chronic infarct in the left occipital lobe. Advanced but chronic ischemic small vessel disease which had progressed since 10/31/2011 study. Also noted were changes in the left ocular globe with question of vitreous hemorrhage versus retinal detachment.  Patient seen in consultation by neurology. Acute versus subacute changes in the occipital lobe correlate poorly with clinical history and findings. The patient is already on chronic anticoagulation with Xarelto. Recommendation was to continue this and to complete a routine stroke workup with attention to reversible risk factors.  Current exam: Blood pressure 172/98, pulse 70, temperature 98.1 F (36.7 C), temperature source Oral, resp. rate  16, height 6' (1.829 m), weight 169 lb 4.8 oz (76.794 kg), SpO2 98.00%. No change from findings as recorded by Dr. Trudee Kuster. Patient's daughter is here and give [place] a detailed history.  Physical therapy has  seen the patient. There is already a significant improvement compared with previously reported changes. He was able to ambulate with a walker with minimal assistance and no obvious ataxia although movement was slow and he had trouble making turns.  Impression: #1. Presenting signs to me suggest acute on chronic ischemia to the cerebellar area. No gross infarct seen on CT or MRI. He appears to be improving rapidly. We will continue anticoagulation and proceed with the remainder of his stroke evaluation. Optimize blood pressure control.  #2. Possible retinal hemorrhage versus retinal detachment. Resident physician unable to examine the fundus. Ophthalmology has been consulted and will evaluate the patient.  #3. History of recurrent DVTs and placement of a caval filter on chronic anticoagulation. We will need to research the history for more details.  Murriel Hopper, MD, [place]  Hematology-Oncology/Internal Medicine

## 2013-12-19 NOTE — Progress Notes (Signed)
STROKE TEAM PROGRESS NOTE   HISTORY  [name] is an 80 y.o. male with a past medical history significant for HTN, hyperlipidemia, cerebellar stroke in 2013 with residual imbalance, DVT s/p filter placement currently on xarelto, frequent falls, brought in by family due to increasing imbalance since yesterday.  [name] has trouble hearing, so history was obtained from him and his daughter  Daughter is at the bedside and said that his father has some trouble with balance since his stroke but for the most part is typically able to stand and walk. However, yesterday she started noticing increasing difficulty with balance with difficulty standing and a tendency to lean to the right.She also reports that his speech is more slurred than usual.No associated vertigo, double vision, difficulty swallowing, HA, language or vision disturbances.  CT brain showed no acute abnormality but a subsequent MRI brain revealed a small acute on chronic infarct in the left occipital pole, left PCA territory. Possible petechial hemorrhage, but no associated mass effect.  Patient is on xarelto and plavix.   Date last known well: 12/17/13  Time last known well: uncertain  tPA Given: no, out of the window    SUBJECTIVE (INTERVAL HISTORY) Difficult conversation secondary to patient's extreme hearing loss. No family members present.   OBJECTIVE Temp:  [97.7 F (36.5 C)-98.2 F (36.8 C)] 98.1 F (36.7 C) (10/08 1813) Pulse Rate:  [54-108] 70 (10/09 1417) Cardiac Rhythm:  [-] Normal sinus rhythm (10/09 1130) Resp:  [14-24] 16 (10/09 1417) BP: (126-201)/(66-111) 126/97 mmHg (10/09 1417) SpO2:  [93 %-100 %] 98 % (10/09 1417) Weight:  [169 lb 4.8 oz (76.794 kg)] 169 lb 4.8 oz (76.794 kg) (10/09 0643)  No results found for this basename: GLUCAP,  in the last 168 hours  Recent Labs Lab 12/18/13 1732 12/18/13 1750  NA 143 141  K 4.1 3.8  CL 104 110  CO2 25  --   GLUCOSE 100* 104*  BUN 16 15  CREATININE  1.02 1.10  CALCIUM 10.5  --     Recent Labs Lab 12/18/13 1732  AST 19  ALT 8  ALKPHOS 74  BILITOT 0.5  PROT 7.8  ALBUMIN 3.7    Recent Labs Lab 12/18/13 1732 12/18/13 1750  WBC 6.8  --   NEUTROABS 5.0  --   HGB 11.9* 12.6*  HCT 36.1* 37.0*  MCV 89.6  --   PLT 152  --     Recent Labs Lab 12/19/13 0427  CKTOTAL 66    Recent Labs  12/18/13 1732  LABPROT 14.4  INR 1.12    Recent Labs  12/18/13 1904  COLORURINE YELLOW  LABSPEC 1.014  PHURINE 7.5  GLUCOSEU NEGATIVE  HGBUR MODERATE*  BILIRUBINUR NEGATIVE  KETONESUR 15*  PROTEINUR NEGATIVE  UROBILINOGEN 1.0  NITRITE NEGATIVE  LEUKOCYTESUR NEGATIVE       Component Value Date/Time   CHOL 119 12/19/2013 0427   TRIG 45 12/19/2013 0427   HDL 56 12/19/2013 0427   CHOLHDL 2.1 12/19/2013 0427   VLDL 9 12/19/2013 0427   LDLCALC 54 12/19/2013 0427   Lab Results  Component Value Date   HGBA1C 5.8* 12/19/2013      Component Value Date/Time   LABOPIA NONE DETECTED 12/18/2013 1904   COCAINSCRNUR NONE DETECTED 12/18/2013 1904   LABBENZ NONE DETECTED 12/18/2013 1904   AMPHETMU NONE DETECTED 12/18/2013 1904   THCU NONE DETECTED 12/18/2013 1904   LABBARB NONE DETECTED 12/18/2013 1904     Recent Labs Lab 12/18/13 1732  ETH <11    Dg Chest 2 View 12/18/2013    No active cardiopulmonary disease.     Ct Head Wo Contrast 12/18/2013    1. No acute intracranial pathology.  2. Stable left sphenoid wing meningioma.     [name] Wo Contrast 12/19/2013    Negative for age intracranial MRA.  Improved right superior cerebellar artery flow compared to the 2013 MRA (done at the time of a right SCA infarct).   Mr Brain Wo Contrast 12/18/2013    1. Small acute on chronic infarct in the left occipital pole, left PCA territory. Possible petechial hemorrhage, but no associated mass effect.  2. Advanced chronic ischemic and small vessel disease elsewhere, with progression since 2013.  3. Chronic left sphenoid wing meningioma.   4. New signal changes in the left globe, query recent left vitreous hemorrhage or retinal/choroidal detachment.       PHYSICAL EXAM Elderly male not in distress.Awake alert. Afebrile. Head is nontraumatic. Neck is supple without bruit. Hearing is poor .Marland Kitchen Cardiac exam no murmur or gallop. Lungs are clear to auscultation. Distal pulses are well felt.  Mental Status:  Alert, oriented to place and person, thought content appropriate. Mild dysarthria without evidence of aphasia. Able to follow 3 step commands without difficulty.  Cranial Nerves:  II: Discs flat bilaterally; Visual fields grossly normal, pupils equal, round, reactive to light  III,IV, VI: ptosis not present, extra-ocular motions intact bilaterally  V,VII: smile symmetric, facial light touch sensation normal bilaterally  VIII: hearing poor bilaterally  IX,X: gag reflex present  XI: bilateral shoulder shrug  XII: midline tongue extension without atrophy or fasciculations  Motor:  Right : Upper extremity 5/5 Left: Upper extremity 5/5  Lower extremity 5/5 Lower extremity 5/5  Tone and bulk:normal tone throughout; no atrophy noted  Sensory: Pinprick and light touch intact throughout, bilaterally  Deep Tendon Reflexes:  1+ all over  Plantars:  Right: downgoing Left: downgoing  Cerebellar:  normal finger-to-nose, normal heel-to-shin test  Gait:  Not tested for safety reasons  CV: pulses palpable throughout    ASSESSMENT/PLAN [name] is a 80 y.o. male with history of hypertension, hyperlipidemia, cerebellar stroke in 2015 with residual imbalance, DVT status post filter placement currently on Xarelto, presenting with unsteadiness and increasing dysarthria. He did not receive IV t-PA secondary to late presentation.  Stroke Non-Dominnt  infarct secondary to small vessel disease.  MRI  Small acute on chronic infarct in the left occipital pole, left PCA territory.  MRA  Advanced chronic ischemic and small vessel  disease elsewhere  Carotid Doppler pending  2D Echo  pending  LDL 54 - Lipitor prior to admission  HgbA1c - 5.8  Lovenox for VTE prophylaxis  Renal thin liquids.   Bedrest  Resultant - balance difficulties  clopidogrel 75 mg orally every day and xarelto ( rivaroxaban) prior to admission, now on no antithrombotics  Patient counseled to be compliant with his antithrombotic medications  Ongoing aggressive risk factor management  Therapy recommendations:  Physical therapy recommends SNF  Disposition:  Pending  Hypertension  Home meds:   Lopressor 25 mg daily - held  Variable  Hyperlipidemia  Home meds: Lipitor 20 mg daily - not resumed in hospital  LDL 54  Continue statin at discharge   Other Stroke Risk Factors Advanced age Hx stroke/TIA   Other Active Problems New signal changes in the left globe, query recent left vitreous hemorrhage or retinal/choroidal detachment.    Was on Xarelto  for recurrent DVT - held Was on Plavix for previous stroke - held Consider aspirin 325 mg daily while off Plavix and Xarelto  Other Pertinent History    Hospital day # [place] PA-C [facility] Pager [phone] 12/19/2013, 3:12 PM I have personally examined this patient, reviewed notes, independently viewed imaging studies, participated in medical decision making and plan of care. I have made any additions or clarifications directly to the above note. Agree with note above.  The patient's presenting symptoms do not match his tiny left occipital pole infarct which is likely incidental. Continue anticoagulation with Xarelto  Antony Contras, [place] Pager: [phone] 12/19/2013 3:32 PM     To contact Stroke Continuity provider, please refer to [email or web address] After hours, contact General Neurology

## 2013-12-19 NOTE — Progress Notes (Signed)
Notified MD on call in regards to administering lovenox with possible hemmorage related to eye. She stated to hold off until she spoke with opthamologist. She would call back with an answer

## 2013-12-19 NOTE — Evaluation (Signed)
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 Head CT showed no acute stroke, but MRI showed a small acute on chronic infarct in the left occipital pole.  A possible vitreous hemorrhage vs. retinal detachment was identified in his left eye.    Clinical Impression  Patient presents with functional limitations due to deficits listed in PT problem list (see below). Pt with RLE ataxia (which may be baseline due to previous cerebellar CVA in 2013) and balance deficits affecting safe mobility. Difficult to obtain PLOF/history as pt extremely HOH and no family members present. Pt requires hands on assist for all OOB mobility for safety if disposition is home. Pt would benefit from ST SNF to improve mobility, transfers and overall safety so pt can maximize independence and return to PLOF.    Follow Up Recommendations SNF;Supervision/Assistance - 24 hour (.)    Equipment Recommendations  None recommended by PT    Recommendations for Other Services       Precautions / Restrictions Precautions Precautions: Fall Restrictions Weight Bearing Restrictions: No      Mobility  Bed Mobility Overal bed mobility: Needs Assistance Bed Mobility: Supine to Sit     Supine to sit: Min guard     General bed mobility comments: Increased time. Min guard for safety. Cues for technique. Use of rails to scoot bottom forwards.  Transfers Overall transfer level: Needs assistance Equipment used: Rolling walker (2 wheeled) Transfers: Sit to/from Stand Sit to Stand: Min assist         General transfer comment: VC for hand  placement however pt continued to pull down on RW to stand, therapist stabilizing RW for support. Min A to stabilize RW. Posterior lean. VC to reach back for chair with uncontrolled descent.  Ambulation/Gait Ambulation/Gait assistance: Min assist Ambulation Distance (Feet): 16 Feet Assistive device: Rolling walker (2 wheeled) Gait Pattern/deviations: Ataxic;Decreased stride length;Trunk flexed Gait velocity: decreased   General Gait Details: Slow, ataxic gait pattern noted with posterior trunk lean. DIfficulty with turns requiring assist negotiating RW. Unsteady.Min A to maintain balance.   Stairs            Wheelchair Mobility    Modified Rankin (Stroke Patients Only)       Balance Overall balance assessment: Needs assistance Sitting-balance support: Feet supported Sitting balance-Leahy Scale: Fair Sitting balance - Comments: Required assist donning shoes sitting EOB. Posterior lean during dynamic sitting activities.  Postural control: Posterior lean Standing balance support: Bilateral upper extremity supported;During functional activity Standing balance-Leahy Scale: Poor Standing balance comment: Requires UE support during static and dynamic standing due to balance deficits and safety.                             Pertinent Vitals/Pain Pain Assessment: No/denies pain    Home Living Family/patient expects to be discharged to:: Private residence Living Arrangements: Children Available Help at Discharge: Family;Available 24 hours/day (per pt, daughter is present 24/7.) Type of Home: House Home Access: Ramped entrance     Home Layout: One level Home Equipment: [place] - 2 wheels;Bedside commode;Wheelchair - manual      Prior Function  Level of Independence: Needs assistance   Gait / Transfers Assistance Needed: Per chart (per daughter) pt uses RW for all ambulation.  ADL's / Homemaking Assistance Needed: Difficult to obtain PLOF secondary to extremely  HOH. Reports assist with bathing. Does not drive.   Comments: No family present at this time to determine PLOF.     Hand Dominance        Extremity/Trunk Assessment   Upper Extremity Assessment: Defer to OT evaluation           Lower Extremity Assessment: RLE deficits/detail RLE Deficits / Details: RLE ataxia noted during gait. Strength WFL.       Communication   Communication: HOH  Cognition Arousal/Alertness: Awake/alert Behavior During Therapy: WFL for tasks assessed/performed Overall Cognitive Status: No family/caregiver present to determine baseline cognitive functioning (A&O x3. Difficulty following verbal commands due to [facility].)                      General Comments      Exercises        Assessment/Plan    PT Assessment Patient needs continued PT services  PT Diagnosis Abnormality of gait   PT Problem List Decreased coordination;Decreased balance;Decreased mobility  PT Treatment Interventions Balance training;Gait training;Patient/family education;Functional mobility training;Therapeutic activities;Therapeutic exercise;Neuromuscular re-education   PT Goals (Current goals can be found in the Care Plan section) Acute Rehab PT Goals Patient Stated Goal: to get up and walk PT Goal Formulation: With patient Time For Goal Achievement: 01/02/14 Potential to Achieve Goals: Good    Frequency Min 3X/week   Barriers to discharge        Co-evaluation               End of Session Equipment Utilized During Treatment: Gait belt Activity Tolerance: Patient tolerated treatment well Patient left: in chair;with call bell/phone within reach;with chair alarm set Nurse Communication: Mobility status;Precautions    Functional Assessment Tool Used: Clinical judgment Functional Limitation: Mobility: Walking and moving around Mobility: Walking and Moving Around Current Status [phone]): At least 20 percent but less than 40 percent impaired, limited or  restricted Mobility: Walking and Moving Around Goal Status [phone]): At least 1 percent but less than 20 percent impaired, limited or restricted    Time: 1115-1150 PT Time Calculation (min): 35 min   Charges:   PT Evaluation $Initial PT Evaluation Tier I: 1 Procedure PT Treatments $Gait Training: 8-22 mins   PT G Codes:   Functional Assessment Tool Used: Clinical judgment Functional Limitation: Mobility: Walking and moving around    [place], [place] 12/19/2013, 12:56 PM Candy Sledge, PT, DPT [phone]

## 2013-12-20 DIAGNOSIS — H4312 Vitreous hemorrhage, left eye: Secondary | ICD-10-CM

## 2013-12-20 DIAGNOSIS — R471 Dysarthria and anarthria: Secondary | ICD-10-CM

## 2013-12-20 DIAGNOSIS — I82409 Acute embolism and thrombosis of unspecified deep veins of unspecified lower extremity: Secondary | ICD-10-CM

## 2013-12-20 DIAGNOSIS — I517 Cardiomegaly: Secondary | ICD-10-CM

## 2013-12-20 DIAGNOSIS — B351 Tinea unguium: Secondary | ICD-10-CM

## 2013-12-20 LAB — CBC
HCT: 33.7 % — ABNORMAL LOW (ref 39.0–52.0)
Hemoglobin: 11 g/dL — ABNORMAL LOW (ref 13.0–17.0)
MCH: 28.9 pg (ref 26.0–34.0)
MCHC: 32.6 g/dL (ref 30.0–36.0)
MCV: 88.5 fL (ref 78.0–100.0)
Platelets: 142 10*3/uL — ABNORMAL LOW (ref 150–400)
RBC: 3.81 MIL/uL — ABNORMAL LOW (ref 4.22–5.81)
RDW: 14.6 % (ref 11.5–15.5)
WBC: 5.7 10*3/uL (ref 4.0–10.5)

## 2013-12-20 LAB — BASIC METABOLIC PANEL
Anion gap: 11 (ref 5–15)
BUN: 15 mg/dL (ref 6–23)
CO2: 27 mEq/L (ref 19–32)
Calcium: 10.2 mg/dL (ref 8.4–10.5)
Chloride: 104 mEq/L (ref 96–112)
Creatinine, Ser: 1.2 mg/dL (ref 0.50–1.35)
GFR calc Af Amer: 61 mL/min — ABNORMAL LOW (ref >90)
GFR calc non Af Amer: 53 mL/min — ABNORMAL LOW (ref >90)
Glucose, Bld: 98 mg/dL (ref 70–99)
Potassium: 3.6 mEq/L — ABNORMAL LOW (ref 3.7–5.3)
Sodium: 142 mEq/L (ref 137–147)

## 2013-12-20 MED ORDER — METOPROLOL TARTRATE 25 MG PO TABS
25.0000 mg | ORAL_TABLET | Freq: Two times a day (BID) | ORAL | Status: DC
  Administered 2013-12-20 – 2013-12-22 (×5): 25 mg via ORAL
  Filled 2013-12-20 (×6): qty 1

## 2013-12-20 MED ORDER — RIVAROXABAN 15 MG PO TABS
15.0000 mg | ORAL_TABLET | Freq: Every day | ORAL | Status: DC
  Administered 2013-12-20 – 2013-12-21 (×2): 15 mg via ORAL
  Filled 2013-12-20 (×3): qty 1

## 2013-12-20 MED ORDER — ATORVASTATIN CALCIUM 20 MG PO TABS
20.0000 mg | ORAL_TABLET | Freq: Every day | ORAL | Status: DC
  Administered 2013-12-20 – 2013-12-21 (×2): 20 mg via ORAL
  Filled 2013-12-20 (×3): qty 1

## 2013-12-20 NOTE — Progress Notes (Signed)
Echo Lab  2D Echocardiogram completed.  [place], RDCS 12/20/2013 2:27 PM

## 2013-12-20 NOTE — Progress Notes (Signed)
STROKE TEAM PROGRESS NOTE   HISTORY  [name] is an 80 y.o. male with a past medical history significant for HTN, hyperlipidemia, cerebellar stroke in 2013 with residual imbalance, DVT s/p filter placement currently on xarelto, frequent falls, brought in by family due to increasing imbalance since yesterday.  [name] has trouble hearing, so history was obtained from him and his daughter  Daughter is at the bedside and said that his father has some trouble with balance since his stroke but for the most part is typically able to stand and walk. However, yesterday she started noticing increasing difficulty with balance with difficulty standing and a tendency to lean to the right.She also reports that his speech is more slurred than usual.No associated vertigo, double vision, difficulty swallowing, HA, language or vision disturbances.  CT brain showed no acute abnormality but a subsequent MRI brain revealed a small acute on chronic infarct in the left occipital pole, left PCA territory. Possible petechial hemorrhage, but no associated mass effect.  Patient is on xarelto and plavix.   Date last known well: 12/17/13  Time last known well: uncertain  tPA Given: no, out of the window    SUBJECTIVE (INTERVAL HISTORY) No family members present. The patient is very hard of hearing. He appears to be in no acute distress.   OBJECTIVE Temp:  [98.3 F (36.8 C)-98.8 F (37.1 C)] 98.8 F (37.1 C) (10/10 1205) Pulse Rate:  [67-83] 72 (10/10 1520) Cardiac Rhythm:  [-] Normal sinus rhythm (10/10 0856) Resp:  [16] 16 (10/10 1205) BP: (142-173)/(86-100) 142/96 mmHg (10/10 1520) SpO2:  [96 %-99 %] 97 % (10/10 1205) Weight:  [162 lb 9.6 oz (73.755 kg)] 162 lb 9.6 oz (73.755 kg) (10/10 0449)  No results found for this basename: GLUCAP,  in the last 168 hours  Recent Labs Lab 12/18/13 1732 12/18/13 1750 12/20/13 0316  NA 143 141 142  K 4.1 3.8 3.6*  CL 104 110 104  CO2 25  --  27  GLUCOSE  100* 104* 98  BUN 16 15 15   CREATININE 1.02 1.10 1.20  CALCIUM 10.5  --  10.2    Recent Labs Lab 12/18/13 1732  AST 19  ALT 8  ALKPHOS 74  BILITOT 0.5  PROT 7.8  ALBUMIN 3.7    Recent Labs Lab 12/18/13 1732 12/18/13 1750 12/20/13 0316  WBC 6.8  --  5.7  NEUTROABS 5.0  --   --   HGB 11.9* 12.6* 11.0*  HCT 36.1* 37.0* 33.7*  MCV 89.6  --  88.5  PLT 152  --  142*    Recent Labs Lab 12/19/13 0427  CKTOTAL 66    Recent Labs  12/18/13 1732  LABPROT 14.4  INR 1.12    Recent Labs  12/18/13 1904  COLORURINE YELLOW  LABSPEC 1.014  PHURINE 7.5  GLUCOSEU NEGATIVE  HGBUR MODERATE*  BILIRUBINUR NEGATIVE  KETONESUR 15*  PROTEINUR NEGATIVE  UROBILINOGEN 1.0  NITRITE NEGATIVE  LEUKOCYTESUR NEGATIVE       Component Value Date/Time   CHOL 119 12/19/2013 0427   TRIG 45 12/19/2013 0427   HDL 56 12/19/2013 0427   CHOLHDL 2.1 12/19/2013 0427   VLDL 9 12/19/2013 0427   LDLCALC 54 12/19/2013 0427   Lab Results  Component Value Date   HGBA1C 5.8* 12/19/2013      Component Value Date/Time   LABOPIA NONE DETECTED 12/18/2013 1904   COCAINSCRNUR NONE DETECTED 12/18/2013 1904   LABBENZ NONE DETECTED 12/18/2013 1904   AMPHETMU  NONE DETECTED 12/18/2013 1904   THCU NONE DETECTED 12/18/2013 1904   LABBARB NONE DETECTED 12/18/2013 1904     Recent Labs Lab 12/18/13 1732  ETH <11    Dg Chest 2 View 12/18/2013    No active cardiopulmonary disease.     Ct Head Wo Contrast 12/18/2013    1. No acute intracranial pathology.  2. Stable left sphenoid wing meningioma.     [name] Wo Contrast 12/19/2013    Negative for age intracranial MRA.  Improved right superior cerebellar artery flow compared to the 2013 MRA (done at the time of a right SCA infarct).   Mr Brain Wo Contrast 12/18/2013    1. Small acute on chronic infarct in the left occipital pole, left PCA territory. Possible petechial hemorrhage, but no associated mass effect.  2. Advanced chronic ischemic and small  vessel disease elsewhere, with progression since 2013.  3. Chronic left sphenoid wing meningioma.  4. New signal changes in the left globe, query recent left vitreous hemorrhage or retinal/choroidal detachment.       PHYSICAL EXAM Elderly male not in distress.Awake alert. Afebrile. Head is nontraumatic. Neck is supple without bruit. Hearing is poor .Marland Kitchen Cardiac exam no murmur or gallop. Lungs are clear to auscultation. Distal pulses are well felt.  Mental Status:  Alert, oriented to place and person, thought content appropriate. Mild dysarthria without evidence of aphasia. Able to follow 3 step commands without difficulty.  Cranial Nerves:  II: Discs flat bilaterally; Visual fields grossly normal, pupils equal, round, reactive to light  III,IV, VI: ptosis not present, extra-ocular motions intact bilaterally  V,VII: smile symmetric, facial light touch sensation normal bilaterally  VIII: hearing poor bilaterally  IX,X: gag reflex present  XI: bilateral shoulder shrug  XII: midline tongue extension without atrophy or fasciculations  Motor:  Right : Upper extremity 5/5 Left: Upper extremity 5/5  Lower extremity 5/5 Lower extremity 5/5  Tone and bulk:normal tone throughout; no atrophy noted  Sensory:  light touch intact throughout, bilaterally  Deep Tendon Reflexes:  1+  Plantars:  Right: downgoing Left: downgoing  Gait:  Not tested     ASSESSMENT/PLAN [name] is a 80 y.o. male with history of hypertension, hyperlipidemia, cerebellar stroke in 2015 with residual imbalance, DVT status post filter placement currently on Xarelto, presenting with unsteadiness and increasing dysarthria. He did not receive IV t-PA secondary to late presentation.  Stroke Non-Dominnt  infarct secondary to small vessel disease.  MRI  Small acute on chronic infarct in the left occipital pole, left PCA territory.  MRA  Advanced chronic ischemic and small vessel disease elsewhere  Carotid Doppler  no significant stenosis  2D Echo  Pending  LDL 54 - Lipitor prior to admission  HgbA1c - 5.8  Resultant - balance difficulties  clopidogrel 75 mg orally every day and xarelto ( rivaroxaban) prior to admission, now on no antithrombotics Will go home on Xarelto only.   Patient counseled to be compliant with his antithrombotic medications  Ongoing aggressive risk factor management  Therapy recommendations:  Physical therapy recommends SNF  Disposition:  Pending  Hypertension  Home meds:   Lopressor 25 mg daily - held  Hyperlipidemia  Home meds: Lipitor 20 mg daily   LDL 54  Continue statin at discharge   Other Stroke Risk Factors Advanced age Hx stroke/TIA   Other Active Problems Currently not on Plavix aspirin or Xarelto. The patient was on Xarelto for a history of recurrent DVT. He was  on Plavix for a history of CVA. Consider resuming Xarelto if no contraindication. Notified Dr. Beryle Beams and discussed with him today  Other Pertinent History    The stroke team will sign off at this time. Please call if we can be of further service.   Hospital day # 2  [name] [facility] Pager [phone] 12/20/2013, 5:37 PM I have personally examined this patient, reviewed notes, independently viewed imaging studies, participated in medical decision making and plan of care. I have made any additions or clarifications directly to the above note. Agree with note above.  The patient's presenting symptoms do not match his tiny left occipital pole infarct which is likely incidental. Continue anticoagulation with Xarelto  Antony Contras, MD Medical Director [facility] Pager: [phone] 12/20/2013 5:37 PM  I have personally examined this patient, reviewed notes, independently viewed imaging studies, participated in medical decision making and plan of care. I have made any additions or clarifications directly to the above note. Agree  with note above.  Georgia Dom, MD  [facility]  Pager: [phone] 12/19/2013 5:16 PM       To contact Stroke Continuity provider, please refer to [email or web address] After hours, contact General Neurology

## 2013-12-20 NOTE — Progress Notes (Signed)
Patient ID: [name], male   DOB: 1924/06/03, 80 y.o.   MRN: [id number] Medicine attending: I personally examined this patient this morning and I concur with the evaluation and management plan outlined by resident physician Dr. Dellia Nims. We appreciate neurology and ophthalmology input. With respect to his acute on chronic infarct seen on the MRI in the left occipital lobe, this to correlates poorly with his presenting signs which support chronic changes from previous cerebellar infarct. We will go ahead and resume anticoagulation with Xarelto with a slight dose decrease of 15 mg in view of his age, renal function with estimated GFR 61 mL per minute, and his unsteady gait. Unfortunately it appears that he does need to be anticoagulated to prevent recurrent stroke and DVT. With respect to the ocular findings on MRI, Dr Talbert Forest feels this is likely a vitreous hemorrhage in the left eye. Acuity indeterminate. No contraindications to resuming anticoagulation from ophthalmology point of view. Further evaluation by a retinal specialist recommended after discharge. Contact made by Dr. Talbert Forest to Dr. Zadie Rhine. Once the remainder of the stroke evaluation is complete, I feel the patient can be discharged and we will arrange for physical therapy as an outpatient.  Murriel Hopper, MD, [place]  Hematology-Oncology/Internal Medicine

## 2013-12-20 NOTE — Progress Notes (Signed)
Subjective: Has no complaint today. He is more interactive today. Speech is slightly more clear today. Denies cp/sob/n/v/fever/chills/cough.  Objective: Vital signs in last 24 hours: Filed Vitals:   12/19/13 1617 12/19/13 2009 12/20/13 0000 12/20/13 0449  BP: 172/98 164/86 165/93 173/94  Pulse:  67 69 72  Temp:  98.7 F (37.1 C) 98.6 F (37 C) 98.3 F (36.8 C)  TempSrc:  Oral Oral Oral  Resp:      Height:      Weight:    73.755 kg (162 lb 9.6 oz)  SpO2:  99% 98% 96%   Weight change: -3.039 kg (-6 lb 11.2 oz)  Intake/Output Summary (Last 24 hours) at 12/20/13 0722 Last data filed at 12/19/13 1900  Gross per 24 hour  Intake    520 ml  Output   1000 ml  Net   -480 ml   Vitals reviewed. General: resting in bed, NAD HEENT: PERRL, EOMI, no scleral icterus.  Cardiac: RRR, no rubs, murmurs or gallops Pulm: clear to auscultation bilaterally, no wheezes, rales, or rhonchi Abd: soft, nontender, nondistended, BS present Ext: warm and well perfused, no pedal edema Neuro: alert, interactive, cranial nerves II-XII grossly intact, strength and sensation to light touch equal in bilateral upper and lower extremities. strenght 5/5 everywhere.  Lab Results: Basic Metabolic Panel:  Recent Labs Lab 12/18/13 1732 12/18/13 1750 12/20/13 0316  NA 143 141 142  K 4.1 3.8 3.6*  CL 104 110 104  CO2 25  --  27  GLUCOSE 100* 104* 98  BUN 16 15 15   CREATININE 1.02 1.10 1.20  CALCIUM 10.5  --  10.2   Liver Function Tests:  Recent Labs Lab 12/18/13 1732  AST 19  ALT 8  ALKPHOS 74  BILITOT 0.5  PROT 7.8  ALBUMIN 3.7   No results found for this basename: LIPASE, AMYLASE,  in the last 168 hours No results found for this basename: AMMONIA,  in the last 168 hours CBC:  Recent Labs Lab 12/18/13 1732 12/18/13 1750 12/20/13 0316  WBC 6.8  --  5.7  NEUTROABS 5.0  --   --   HGB 11.9* 12.6* 11.0*  HCT 36.1* 37.0* 33.7*  MCV 89.6  --  88.5  PLT 152  --  142*   Cardiac  Enzymes:  Recent Labs Lab 12/19/13 0427  CKTOTAL 66   BNP: No results found for this basename: PROBNP,  in the last 168 hours D-Dimer: No results found for this basename: DDIMER,  in the last 168 hours CBG: No results found for this basename: GLUCAP,  in the last 168 hours Hemoglobin A1C:  Recent Labs Lab 12/19/13 0427  HGBA1C 5.8*   Fasting Lipid Panel:  Recent Labs Lab 12/19/13 0427  CHOL 119  HDL 56  LDLCALC 54  TRIG 45  CHOLHDL 2.1   Thyroid Function Tests: No results found for this basename: TSH, T4TOTAL, FREET4, T3FREE, THYROIDAB,  in the last 168 hours Coagulation:  Recent Labs Lab 12/18/13 1732  LABPROT 14.4  INR 1.12   Anemia Panel: No results found for this basename: VITAMINB12, FOLATE, FERRITIN, TIBC, IRON, RETICCTPCT,  in the last 168 hours Urine Drug Screen: Drugs of Abuse     Component Value Date/Time   LABOPIA NONE DETECTED 12/18/2013 1904   COCAINSCRNUR NONE DETECTED 12/18/2013 1904   LABBENZ NONE DETECTED 12/18/2013 1904   AMPHETMU NONE DETECTED 12/18/2013 1904   THCU NONE DETECTED 12/18/2013 1904   LABBARB NONE DETECTED 12/18/2013 1904  Alcohol Level:  Recent Labs Lab 12/18/13 1732  ETH <11   Urinalysis:  Recent Labs Lab 12/18/13 1904  COLORURINE YELLOW  LABSPEC 1.014  PHURINE 7.5  GLUCOSEU NEGATIVE  HGBUR MODERATE*  BILIRUBINUR NEGATIVE  KETONESUR 15*  PROTEINUR NEGATIVE  UROBILINOGEN 1.0  NITRITE NEGATIVE  LEUKOCYTESUR NEGATIVE   Misc. Labs:  Micro Results: No results found for this or any previous visit (from the past 240 hour(s)). Studies/Results: Dg Chest 2 View  12/18/2013   CLINICAL DATA:  Weakness and unsteadiness.  No chest complaints.  EXAM: CHEST  2 VIEW  COMPARISON:  08/21/2013  FINDINGS: There is no focal parenchymal opacity, pleural effusion, or pneumothorax. Stable cardiomegaly. Stable prominence of the central pulmonary vasculature.  The osseous structures are unremarkable.  IMPRESSION: No active  cardiopulmonary disease.   Electronically Signed   By: Kathreen Devoid   On: 12/18/2013 17:15   Ct Head Wo Contrast  12/18/2013   CLINICAL DATA:  Weakness  EXAM: CT HEAD WITHOUT CONTRAST  TECHNIQUE: Contiguous axial images were obtained from the base of the skull through the vertex without intravenous contrast.  COMPARISON:  07/08/2013  FINDINGS: There is a 2.7 cm hyperdense mass arising from the left sphenoid wing most consistent with a meningioma. There is no evidence of midline shift or extra-axial fluid collections. There is no evidence of other space-occupying lesion or intracranial hemorrhage. There is no evidence of a cortical-based area of acute infarction. There is an old right cerebellar infarct with encephalomalacia. There is generalized cerebral atrophy. There is periventricular white matter low attenuation likely secondary to microangiopathy.  The ventricles and sulci are appropriate for the patient's age. The basal cisterns are patent.  Visualized portions of the orbits are unremarkable. The visualized portions of the paranasal sinuses and mastoid air cells are unremarkable. Cerebrovascular atherosclerotic calcifications are noted.  The osseous structures are unremarkable.  IMPRESSION: 1. No acute intracranial pathology. 2. Stable left sphenoid wing meningioma.   Electronically Signed   By: Kathreen Devoid   On: 12/18/2013 17:31   Mr [name] Head Wo Contrast  12/19/2013   CLINICAL DATA:  80 year old male who awoke 2 days ago and could not stand, falling to the right. Found have small acute on chronic left occipital infarct on MRI. Initial encounter.  EXAM: MRA HEAD WITHOUT CONTRAST  TECHNIQUE: Angiographic images of the Circle of Willis were obtained using MRA technique without intravenous contrast.  COMPARISON:  Brain MRI 12/18/2013.  Intracranial MRA 07/20/2011.  FINDINGS: Stable antegrade flow in the posterior circulation. No distal vertebral artery stenosis. Normal left PICA origin. Patent  vertebrobasilar junction. Stable AICA origins. No basilar artery stenosis. Left SCA and PCA origins remain normal. There is improved right SCA flow since the prior. Bilateral PCA branches are stable and within normal limits.  Tortuous distal cervical ICAs. Stable antegrade flow in both ICA siphons. No siphon stenosis. Ophthalmic artery origins are within normal limits. Small posterior communicating arteries remain within normal limits. Stable and normal carotid termini. Dominant right ACA. Anterior communicating artery and visualized bilateral ACA branches are stable and within normal limits. Visualized bilateral MCA branches are stable and within normal limits.  IMPRESSION: Negative for age intracranial MRA.  Improved right superior cerebellar artery flow compared to the 2013 MRA (done at the time of a right SCA infarct).   Electronically Signed   By: Lars Pinks M.D.   On: 12/19/2013 13:40   Mr Brain Wo Contrast  12/18/2013   CLINICAL DATA:  80 year old male who  awoke yesterday and could not stand, falling to the right. Symptoms progressing. Initial encounter.  Personal history of left sphenoid wing meningioma.  EXAM: MRI HEAD WITHOUT CONTRAST  TECHNIQUE: Multiplanar, multiecho pulse sequences of the brain and surrounding structures were obtained without intravenous contrast.  COMPARISON:  Head CT without contrast 1716 hr today. Brain MRI 07/19/2011.  FINDINGS: Expected evolution of the right cerebellar infarct which occurred in 2013, now with widespread right cerebellar encephalomalacia. Underlying chronic bilateral cerebellar infarcts, progressed on the left since 2013.  Major intracranial vascular flow voids are stable.  Small cortically based restricted diffusion in the left occipital pole best seen on series 4, image 15. No other restricted diffusion. This occurs in a an area of cortical encephalomalacia. There are petechial blood products (series 8, image 71) which were not evident in 2013. No mass effect.   Chronic right occipital pole infarct appears stable. Widespread confluent cerebral white matter T2 and FLAIR hyperintensity has not significantly changed. Stable T2 heterogeneity in the deep gray matter. Chronic left sphenoid wing meningioma is stable measuring 2-2.5 cm diameter.  Chronic micro hemorrhages elsewhere in the brain are more numerous, but might in part be visualized to better effect today due to 3 Tesla imaging.  No ventriculomegaly. Negative pituitary, cervicomedullary junction and visualized cervical spine. Normal bone marrow signal. Visible internal auditory structures appear normal. Visualized paranasal sinuses and mastoids are clear.  New signal changes in the left globe (series 6, image 8). Right orbit soft tissues appear stable and within normal limits.  IMPRESSION: 1. Small acute on chronic infarct in the left occipital pole, left PCA territory. Possible petechial hemorrhage, but no associated mass effect. 2. Advanced chronic ischemic and small vessel disease elsewhere, with progression since 2013. 3. Chronic left sphenoid wing meningioma. 4. New signal changes in the left globe, query recent left vitreous hemorrhage or retinal/choroidal detachment.   Electronically Signed   By: Lars Pinks M.D.   On: 12/18/2013 21:39   Medications: I have reviewed the patient's current medications. Scheduled Meds: .  stroke: mapping our early stages of recovery book   Does not apply Once  . enoxaparin (LOVENOX) injection  40 mg Subcutaneous Q24H   Continuous Infusions:  PRN Meds:. 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   Had difficulty standing and tendency to lean to the right.  Possible TIA - Gait abnormality with dysarthria- - uses walker at baseline at home from  previous stroke.  - CT normal but MRI showed small acute on chronic infarct of left occipital pole, left PCA territory, left globe ?viterous hemorrhage/retinal detachment.  - per neuro, his symptoms doesn't match the small infarct that's seen on imaging. He likely had the episode of gait abnormality more than his baseline likely due to a TIA.  - continue stroke workup: ECHO pending. carotid doppler pending, MRA negative for any changes.  -neuro checks, telemetry. Risk factor modification.  - PT/OT SLP -hgba1c 5.8. LDL 54, HDL 56.  - he was on xarelto for recurrent DVT and plavix for previous CVA. We will re-start lower dose 15mg  xarelto.   #left viterous hemorrhage - seen on MRI  -doesn't appear to be recent per optho assessment. Recommended sleeping with head of bed elevated to allow viterous heme to settle and possibley  improve his vision. Also has cataracts of both eyes. Will see Dr. Zadie Rhine next week as outpatient. appreciate optho recs. They are ok to restart anticoag.  #recurrent dvt  - has high risk of bleed given hx of unsteadiness. However, he is at risk of recurrent DVT's - continue xarelto at lower dose 15mg  xarelto.  Hypertension  Blood pressure elevated in ER. Reported compliance.  -Held metoprolol for permissive HTN 24 hours. Will restart his home metoprolol 25mg  BID today.  Onychomycosis - Needs podiatry outpatient  Dispo: Disposition is deferred at this time, awaiting improvement of current medical problems.  Anticipated discharge in approximately 2-3 day(s).   The patient does have a current PCP [name], DO) and does need an [facility] hospital follow-up appointment after discharge.  The patient does have transportation limitations that hinder transportation to clinic appointments.  .Services Needed at time of discharge: Y = Yes, Blank = No PT:   OT:   RN:   Equipment:   Other:     LOS: 2 days   Dellia Nims, MD 12/20/2013, 7:22 AM

## 2013-12-20 NOTE — Progress Notes (Signed)
Bilateral carotid artery duplex completed:  1-39% ICA stenosis.  Vertebral artery flow is antegrade.     

## 2013-12-21 DIAGNOSIS — H4310 Vitreous hemorrhage, unspecified eye: Secondary | ICD-10-CM

## 2013-12-21 HISTORY — DX: Vitreous hemorrhage, unspecified eye: H43.10

## 2013-12-21 MED ORDER — BENAZEPRIL HCL 5 MG PO TABS: 5.0000 mg | ORAL_TABLET | Freq: Every day | ORAL | Status: DC

## 2013-12-21 MED ORDER — RIVAROXABAN 15 MG PO TABS: 15.0000 mg | ORAL_TABLET | Freq: Every day | ORAL | Status: DC

## 2013-12-21 MED ORDER — BENAZEPRIL HCL 5 MG PO TABS
5.0000 mg | ORAL_TABLET | Freq: Every day | ORAL | Status: DC
  Administered 2013-12-21 – 2013-12-22 (×2): 5 mg via ORAL
  Filled 2013-12-21 (×2): qty 1

## 2013-12-21 MED ORDER — STROKE: EARLY STAGES OF RECOVERY BOOK: 1.0000 | Freq: Once | Status: DC

## 2013-12-21 NOTE — Discharge Instructions (Addendum)
It was a pleasure taking care of you. You were admitted here for possible stroke. We have not found any stroke but you may have had a TIA (small stroke) that we don't see. You should continue to improve with therapy at the rehab.  Transient Ischemic Attack A transient ischemic attack (TIA) is a "warning stroke" that causes stroke-like symptoms. Unlike a stroke, a TIA does not cause permanent damage to the brain. The symptoms of a TIA can happen very fast and do not last long. It is important to know the symptoms of a TIA and what to do. This can help prevent a major stroke or death. CAUSES   A TIA is caused by a temporary blockage in an artery in the brain or neck (carotid artery). The blockage does not allow the brain to get the blood supply it needs and can cause different symptoms. The blockage can be caused by either:  A blood clot.  Fatty buildup (plaque) in a neck or brain artery. RISK FACTORS  High blood pressure (hypertension).  High cholesterol.  Diabetes mellitus.  Heart disease.  The build up of plaque in the blood vessels (peripheral artery disease or atherosclerosis).  The build up of plaque in the blood vessels providing blood and oxygen to the brain (carotid artery stenosis).  An abnormal heart rhythm (atrial fibrillation).  Obesity.  Smoking.  Taking oral contraceptives (especially in combination with smoking).  Physical inactivity.  A diet high in fats, salt (sodium), and calories.  Alcohol use.  Use of illegal drugs (especially cocaine and methamphetamine).  Being male.  Being African American.  Being over the age of 69.  Family history of stroke.  Previous history of blood clots, stroke, TIA, or heart attack.  Sickle cell disease. SYMPTOMS  TIA symptoms are the same as a stroke but are temporary. These symptoms usually develop suddenly, or may be newly present upon awakening from sleep:  Sudden weakness or numbness of the face, arm, or leg,  especially on one side of the body.  Sudden trouble walking or difficulty moving arms or legs.  Sudden confusion.  Sudden personality changes.  Trouble speaking (aphasia) or understanding.  Difficulty swallowing.  Sudden trouble seeing in one or both eyes.  Double vision.  Dizziness.  Loss of balance or coordination.  Sudden severe headache with no known cause.  Trouble reading or writing.  Loss of bowel or bladder control.  Loss of consciousness. DIAGNOSIS  Your caregiver may be able to determine the presence or absence of a TIA based on your symptoms, history, and physical exam. Computed tomography (CT scan) of the brain is usually performed to help identify a TIA. Other tests may be done to diagnose a TIA. These tests may include:  Electrocardiography.  Continuous heart monitoring.  Echocardiography.  Carotid ultrasonography.  Magnetic resonance imaging (MRI).  A scan of the brain circulation.  Blood tests. PREVENTION  The risk of a TIA can be decreased by appropriately treating high blood pressure, high cholesterol, diabetes, heart disease, and obesity and by quitting smoking, limiting alcohol, and staying physically active. TREATMENT  Time is of the essence. Since the symptoms of TIA are the same as a stroke, it is important to seek treatment as soon as possible because you may need a medicine to dissolve the clot (thrombolytic) that cannot be given if too much time has passed. Treatment options vary. Treatment options may include rest, oxygen, intravenous (IV) fluids, and medicines to thin the blood (anticoagulants). Medicines and diet  may be used to address diabetes, high blood pressure, and other risk factors. Measures will be taken to prevent short-term and long-term complications, including infection from breathing foreign material into the lungs (aspiration pneumonia), blood clots in the legs, and falls. Treatment options include procedures to either remove  plaque in the carotid arteries or dilate carotid arteries that have narrowed due to plaque. Those procedures are:  Carotid endarterectomy.  Carotid angioplasty and stenting. HOME CARE INSTRUCTIONS   Take all medicines prescribed by your caregiver. Follow the directions carefully. Medicines may be used to control risk factors for a stroke. Be sure you understand all your medicine instructions.  You may be told to take aspirin or the anticoagulant warfarin. Warfarin needs to be taken exactly as instructed.  Taking too much or too little warfarin is dangerous. Too much warfarin increases the risk of bleeding. Too little warfarin continues to allow the risk for blood clots. While taking warfarin, you will need to have regular blood tests to measure your blood clotting time. A PT blood test measures how long it takes for blood to clot. Your PT is used to calculate another value called an INR. Your PT and INR help your caregiver to adjust your dose of warfarin. The dose can change for many reasons. It is critically important that you take warfarin exactly as prescribed.  Many foods, especially foods high in vitamin K can interfere with warfarin and affect the PT and INR. Foods high in vitamin K include spinach, kale, broccoli, cabbage, collard and turnip greens, brussels sprouts, peas, cauliflower, seaweed, and parsley as well as beef and pork liver, green tea, and soybean oil. You should eat a consistent amount of foods high in vitamin K. Avoid major changes in your diet, or notify your caregiver before changing your diet. Arrange a visit with a dietitian to answer your questions.  Many medicines can interfere with warfarin and affect the PT and INR. You must tell your caregiver about any and all medicines you take, this includes all vitamins and supplements. Be especially cautious with aspirin and anti-inflammatory medicines. Do not take or discontinue any prescribed or over-the-counter medicine except on  the advice of your caregiver or pharmacist.  Warfarin can have side effects, such as excessive bruising or bleeding. You will need to hold pressure over cuts for longer than usual. Your caregiver or pharmacist will discuss other potential side effects.  Avoid sports or activities that may cause injury or bleeding.  Be mindful when shaving, flossing your teeth, or handling sharp objects.  Alcohol can change the body's ability to handle warfarin. It is best to avoid alcoholic drinks or consume only very small amounts while taking warfarin. Notify your caregiver if you change your alcohol intake.  Notify your dentist or other caregivers before procedures.  Eat a diet that includes 5 or more servings of fruits and vegetables each day. This may reduce the risk of stroke. Certain diets may be prescribed to address high blood pressure, high cholesterol, diabetes, or obesity.  A low-sodium, low-saturated fat, low-trans fat, low-cholesterol diet is recommended to manage high blood pressure.  A low-saturated fat, low-trans fat, low-cholesterol, and high-fiber diet may control cholesterol levels.  A controlled-carbohydrate, controlled-sugar diet is recommended to manage diabetes.  A reduced-calorie, low-sodium, low-saturated fat, low-trans fat, low-cholesterol diet is recommended to manage obesity.  Maintain a healthy weight.  Stay physically active. It is recommended that you get at least 30 minutes of activity on most or all days.  Do  not smoke.  Limit alcohol use even if you are not taking warfarin. Moderate alcohol use is considered to be:  No more than 2 drinks each day for men.  No more than 1 drink each day for nonpregnant women.  Stop drug abuse.  Home safety. A safe home environment is important to reduce the risk of falls. Your caregiver may arrange for specialists to evaluate your home. Having grab bars in the bedroom and bathroom is often important. Your caregiver may arrange for  equipment to be used at home, such as raised toilets and a seat for the shower.  Follow all instructions for follow-up with your caregiver. This is very important. This includes any referrals and lab tests. Proper follow up can prevent a stroke or another TIA from occurring. SEEK MEDICAL CARE IF:  You have personality changes.  You have difficulty swallowing.  You are seeing double.  You have dizziness.  You have a fever.  You have skin breakdown. SEEK IMMEDIATE MEDICAL CARE IF:  Any of these symptoms may represent a serious problem that is an emergency. Do not wait to see if the symptoms will go away. Get medical help right away. Call your local emergency services (911 in [place] Do not drive yourself to the hospital.  You have sudden weakness or numbness of the face, arm, or leg, especially on one side of the body.  You have sudden trouble walking or difficulty moving arms or legs.  You have sudden confusion.  You have trouble speaking (aphasia) or understanding.  You have sudden trouble seeing in one or both eyes.  You have a loss of balance or coordination.  You have a sudden, severe headache with no known cause.  You have new chest pain or an irregular heartbeat.  You have a partial or total loss of consciousness. MAKE SURE YOU:   Understand these instructions.  Will watch your condition.  Will get help right away if you are not doing well or get worse. Document Released: 12/07/2004 Document Revised: 03/04/2013 Document Reviewed: 06/04/2013 [facility] Patient Information 2015 [place], [place]. This information is not intended to replace advice given to you by your health care provider. Make sure you discuss any questions you have with your health care provider. STROKE/TIA DISCHARGE INSTRUCTIONS SMOKING Cigarette smoking nearly doubles your risk of having a stroke & is the single most alterable risk factor  If you smoke or have smoked in the last 12 months, you are  advised to quit smoking for your health.  Most of the excess cardiovascular risk related to smoking disappears within a year of stopping.  Ask you doctor about anti-smoking medications  [place] Quit Line: 1-800-QUIT NOW  Free Smoking Cessation Classes [phone]  CHOLESTEROL Know your levels; limit fat & cholesterol in your diet  Lipid Panel     Component Value Date/Time   CHOL 119 12/19/2013 0427   TRIG 45 12/19/2013 0427   HDL 56 12/19/2013 0427   CHOLHDL 2.1 12/19/2013 0427   VLDL 9 12/19/2013 0427   LDLCALC 54 12/19/2013 0427      Many patients benefit from treatment even if their cholesterol is at goal.  Goal: Total Cholesterol (CHOL) less than 160  Goal:  Triglycerides (TRIG) less than 150  Goal:  HDL greater than 40  Goal:  LDL (LDLCALC) less than 100   BLOOD PRESSURE American Stroke Association blood pressure target is less that 120/80 mm/Hg  Your discharge blood pressure is:  BP: 150/87 mmHg  Monitor your  blood pressure  Limit your salt and alcohol intake  Many individuals will require more than one medication for high blood pressure  DIABETES (A1c is a blood sugar average for last 3 months) Goal HGBA1c is under 7% (HBGA1c is blood sugar average for last 3 months)  Diabetes: No known diagnosis of diabetes    Lab Results  Component Value Date   HGBA1C 5.8* 12/19/2013     Your HGBA1c can be lowered with medications, healthy diet, and exercise.  Check your blood sugar as directed by your physician  Call your physician if you experience unexplained or low blood sugars.  PHYSICAL ACTIVITY/REHABILITATION Goal is 30 minutes at least 4 days per week  Activity: Walk with assistance, Therapies:  Return to work:   Activity decreases your risk of heart attack and stroke and makes your heart stronger.  It helps control your weight and blood pressure; helps you relax and can improve your mood.  Participate in a regular exercise program.  Talk with your doctor about the  best form of exercise for you (dancing, walking, swimming, cycling).  DIET/WEIGHT Goal is to maintain a healthy weight  Your discharge diet is: Renal  liquids Your height is:  Height: 6' (182.9 cm) Your current weight is: Weight: 74.118 kg (163 lb 6.4 oz) Your Body Mass Index (BMI) is:     Following the type of diet specifically designed for you will help prevent another stroke.  Your goal weight range is:    Your goal Body Mass Index (BMI) is 19-24.  Healthy food habits can help reduce 3 risk factors for stroke:  High cholesterol, hypertension, and excess weight.  RESOURCES Stroke/Support Group:  Call [phone]   STROKE EDUCATION PROVIDED/REVIEWED AND GIVEN TO PATIENT Stroke warning signs and symptoms How to activate emergency medical system (call 911). Medications prescribed at discharge. Need for follow-up after discharge. Personal risk factors for stroke. Pneumonia vaccine given: No Flu vaccine given: No My questions have been answered, the writing is legible, and I understand these instructions.  I will adhere to these goals & educational materials that have been provided to me after my discharge from the hospital.

## 2013-12-21 NOTE — Care Management Note (Addendum)
    Page 1 of 2   12/22/2013     12:00:03 PM CARE MANAGEMENT NOTE 12/22/2013  Patient:  [name], [name]   Account Number:  [id number]  Date Initiated:  12/21/2013  Documentation initiated by:  [facility]  Subjective/Objective Assessment:   adm: presenting w increasing imbalance     Action/Plan:   discharge planning   Anticipated DC Date:  12/21/2013   Anticipated DC Plan:  [place]  CM consult      [facility] Choice  HOME HEALTH   Choice offered to / List presented to:  C-4 Adult Children   DME arranged  NA      DME agency  NA     [place] arranged  HH-2 PT      [facility] agency  [place]   Status of service:  Completed, signed off Medicare Important Message given?  YES (If response is "NO", the following Medicare IM given date fields will be blank) Date Medicare IM given:  12/22/2013 Medicare IM given by:  GRAVES-BIGELOW,Porchia Sinkler Date Additional Medicare IM given:   Additional Medicare IM given by:    Discharge Disposition:  [place]  Per UR Regulation:  Reviewed for med. necessity/level of care/duration of stay  If discussed at [place] of Stay Meetings, dates discussed:   12/23/2013    Comments:    12-22-13 [place], RN,BSN [phone] Bed is now available at [facility]. Plan is for SNF placment.  12/21/13 14:25 CM met with pt in room and was giv en permission by pt to call his daughter, Sharyn Lull.  Michelle called CM and CM notified Sharyn Lull her father's PT eval is for SNF.  Sharyn Lull and pt refuse SNF.  CM asks for choice of home health ageny.  Pt used [facility] in the past and [facility] requests I set up HHPT with [facility].  Address and contact information verified with Sharyn Lull and Sharyn Lull will be primary contact. CM calls referral to [facility] and [facility] [place] requests I send orders, facesheet, progress note and H&P via fax [phone].  CM faxes aforementioned.  No other CM  needs were communicated. Mariane Masters, BSN, CM [phone].

## 2013-12-21 NOTE — Clinical Social Work Psychosocial (Signed)
Clinical Social Work Department BRIEF PSYCHOSOCIAL ASSESSMENT 12/21/2013  Patient:  [name], [name]     Account Number:  [id number]     Admit date:  12/18/2013  Clinical Social Worker:  Hubert Azure  Date/Time:  12/21/2013 05:51 PM  Referred by:  Physician  Date Referred:  12/21/2013 Referred for  SNF Placement   Other Referral:   Interview type:  Family Other interview type:   [name] (daughter)    PSYCHOSOCIAL DATA Living Status:  FAMILY Admitted from facility:   Level of care:   Primary support name:  [name] [phone]) Primary support relationship to patient:  CHILD, ADULT Degree of support available:   Good    CURRENT CONCERNS Current Concerns  Post-Acute Placement   Other Concerns:    SOCIAL WORK ASSESSMENT / PLAN CSW met with patient and daughter [name]) who was present at bedside. CSW introduced self and explained role. CSW explained SNF process and d/c plan. Per [name], patient adamantly refused SNF because he believed he would have to return to [place] and he felt abandoned at facility during last stay. [name] states she was in agreement with patient receiving home health PT at home, because she perceived patient's strength to be at a certain level. [name] reports upon assisting patient to stand, she required the assistance of a staff member. Per Christine, she and patient discussed her inability to manage him at home. [name] states she and patient discussed short term therapy at a facility in order to increase strength and mobility so he can return home. [name] and patient state preference for [name] first with [facility] second.   Assessment/plan status:  Other - See comment Other assessment/ plan:   CSW to complete FL2 and submit PASARR for SNF placement.   Information/referral to community resources:   CSW provided patient's daughter with community SNF list.    PATIENT'S/FAMILY'S RESPONSE TO PLAN OF CARE: Patient and  daughter were cooperative and pleasant. Daughter expresses genuine concern with patient receiving therapy to increase mobility and strength.   [place], [place] Weekend Clinical Social Worker [phone]

## 2013-12-21 NOTE — Progress Notes (Signed)
Subjective: Has no complaint today. Interacting. Speech mildly unclear but might be his baseline. Denies cp/sob/n/v/fever/chills/cough.  Objective: Vital signs in last 24 hours: Filed Vitals:   12/20/13 1930 12/21/13 0000 12/21/13 0400 12/21/13 0852  BP: 155/89 157/82 157/83 164/98  Pulse: 63 60 62 66  Temp: 98.7 F (37.1 C) 98.5 F (36.9 C) 98.6 F (37 C) 98 F (36.7 C)  TempSrc: Oral Oral Oral Oral  Resp: 20 20 20 19   Height:      Weight:  72.802 kg (160 lb 8 oz)    SpO2: 99% 94% 96% 97%   Weight change: -0.953 kg (-2 lb 1.6 oz)  Intake/Output Summary (Last 24 hours) at 12/21/13 1124 Last data filed at 12/21/13 0501  Gross per 24 hour  Intake      0 ml  Output    350 ml  Net   -350 ml   Vitals reviewed. General: resting in bed, NAD HEENT: PERRL, EOMI, no scleral icterus.  Cardiac: RRR, no rubs, murmurs or gallops Pulm: clear to auscultation bilaterally, no wheezes, rales, or rhonchi Abd: soft, nontender, nondistended, BS present Ext: warm and well perfused, no pedal edema Neuro: alert, interactive, cranial nerves II-XII grossly intact, strength and sensation to light touch equal in bilateral upper and lower extremities. strenght 5/5 everywhere.  Lab Results: Basic Metabolic Panel:  Recent Labs Lab 12/18/13 1732 12/18/13 1750 12/20/13 0316  NA 143 141 142  K 4.1 3.8 3.6*  CL 104 110 104  CO2 25  --  27  GLUCOSE 100* 104* 98  BUN 16 15 15   CREATININE 1.02 1.10 1.20  CALCIUM 10.5  --  10.2   Liver Function Tests:  Recent Labs Lab 12/18/13 1732  AST 19  ALT 8  ALKPHOS 74  BILITOT 0.5  PROT 7.8  ALBUMIN 3.7   No results found for this basename: LIPASE, AMYLASE,  in the last 168 hours No results found for this basename: AMMONIA,  in the last 168 hours CBC:  Recent Labs Lab 12/18/13 1732 12/18/13 1750 12/20/13 0316  WBC 6.8  --  5.7  NEUTROABS 5.0  --   --   HGB 11.9* 12.6* 11.0*  HCT 36.1* 37.0* 33.7*  MCV 89.6  --  88.5  PLT 152  --   142*   Cardiac Enzymes:  Recent Labs Lab 12/19/13 0427  CKTOTAL 66   BNP: No results found for this basename: PROBNP,  in the last 168 hours D-Dimer: No results found for this basename: DDIMER,  in the last 168 hours CBG: No results found for this basename: GLUCAP,  in the last 168 hours Hemoglobin A1C:  Recent Labs Lab 12/19/13 0427  HGBA1C 5.8*   Fasting Lipid Panel:  Recent Labs Lab 12/19/13 0427  CHOL 119  HDL 56  LDLCALC 54  TRIG 45  CHOLHDL 2.1   Thyroid Function Tests: No results found for this basename: TSH, T4TOTAL, FREET4, T3FREE, THYROIDAB,  in the last 168 hours Coagulation:  Recent Labs Lab 12/18/13 1732  LABPROT 14.4  INR 1.12   Anemia Panel: No results found for this basename: VITAMINB12, FOLATE, FERRITIN, TIBC, IRON, RETICCTPCT,  in the last 168 hours Urine Drug Screen: Drugs of Abuse     Component Value Date/Time   LABOPIA NONE DETECTED 12/18/2013 1904   COCAINSCRNUR NONE DETECTED 12/18/2013 1904   LABBENZ NONE DETECTED 12/18/2013 1904   AMPHETMU NONE DETECTED 12/18/2013 1904   THCU NONE DETECTED 12/18/2013 1904   LABBARB NONE DETECTED 12/18/2013  1904    Alcohol Level:  Recent Labs Lab 12/18/13 1732  ETH <11   Urinalysis:  Recent Labs Lab 12/18/13 1904  COLORURINE YELLOW  LABSPEC 1.014  PHURINE 7.5  GLUCOSEU NEGATIVE  HGBUR MODERATE*  BILIRUBINUR NEGATIVE  KETONESUR 15*  PROTEINUR NEGATIVE  UROBILINOGEN 1.0  NITRITE NEGATIVE  LEUKOCYTESUR NEGATIVE   Misc. Labs:  Micro Results: No results found for this or any previous visit (from the past 240 hour(s)). Studies/Results: Mr Virgel Paling Wo Contrast  12/19/2013   CLINICAL DATA:  80 year old male who awoke 2 days ago and could not stand, falling to the right. Found have small acute on chronic left occipital infarct on MRI. Initial encounter.  EXAM: MRA HEAD WITHOUT CONTRAST  TECHNIQUE: Angiographic images of the Circle of Willis were obtained using MRA technique without  intravenous contrast.  COMPARISON:  Brain MRI 12/18/2013.  Intracranial MRA 07/20/2011.  FINDINGS: Stable antegrade flow in the posterior circulation. No distal vertebral artery stenosis. Normal left PICA origin. Patent vertebrobasilar junction. Stable AICA origins. No basilar artery stenosis. Left SCA and PCA origins remain normal. There is improved right SCA flow since the prior. Bilateral PCA branches are stable and within normal limits.  Tortuous distal cervical ICAs. Stable antegrade flow in both ICA siphons. No siphon stenosis. Ophthalmic artery origins are within normal limits. Small posterior communicating arteries remain within normal limits. Stable and normal carotid termini. Dominant right ACA. Anterior communicating artery and visualized bilateral ACA branches are stable and within normal limits. Visualized bilateral MCA branches are stable and within normal limits.  IMPRESSION: Negative for age intracranial MRA.  Improved right superior cerebellar artery flow compared to the 2013 MRA (done at the time of a right SCA infarct).   Electronically Signed   By: Lars Pinks M.D.   On: 12/19/2013 13:40   Medications: I have reviewed the patient's current medications. Scheduled Meds: .  stroke: mapping our early stages of recovery book   Does not apply Once  . atorvastatin  20 mg Oral q1800  . metoprolol tartrate  25 mg Oral BID  . rivaroxaban  15 mg Oral Q supper   Continuous Infusions:  PRN Meds:. 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   Had difficulty standing and tendency to lean to the right.  Possible TIA - Gait abnormality with dysarthria- - uses walker at baseline at home from previous stroke.  - CT normal but MRI showed small acute on chronic infarct of left  occipital pole, left PCA territory, left globe ?viterous hemorrhage/retinal detachment.  - per neuro, his symptoms doesn't match the small infarct that's seen on imaging. He likely had the episode of gait abnormality more than his baseline likely due to a TIA.  - stroke workup completed.  ECHO completed shows EF 50-55% with grade 1 diastolic CHF. carotid doppler 1-39% ICA stenosis b/l. MRA negative for any changes.  - PT/OT SLP -hgba1c 5.8. LDL 54, HDL 56.  - he was on xarelto for recurrent DVT and plavix for previous CVA. We will re-start lower dose 15mg  xarelto. - will send home with home PT.    #left viterous hemorrhage - seen on MRI  -doesn't appear to be recent per optho assessment. Recommended sleeping with head of bed elevated to allow viterous heme  to settle and possibley improve his vision. Also has cataracts of both eyes. Will see Dr. Zadie Rhine next week as outpatient. appreciate optho recs. They are ok to restart anticoag.  #recurrent dvt  - has high risk of bleed given hx of unsteadiness. However, he is at risk of recurrent DVT's - continue xarelto at lower dose 15mg  xarelto.  Hypertension  Blood pressure elevated in ER. Reported compliance.  -restarted his home metoprolol 25mg  BID but BP still slightly high. Will start benazapril 10mg  daily. He was on this in the past but was d/ced b/c of hypotension previously. This will need to be followed outpatient.   Onychomycosis - Needs podiatry outpatient  Dispo: Disposition is deferred at this time, awaiting improvement of current medical problems.  Anticipated discharge in approximately 2-3 day(s).   The patient does have a current PCP [name], DO) and does need an [facility] hospital follow-up appointment after discharge.  The patient does have transportation limitations that hinder transportation to clinic appointments.  .Services Needed at time of discharge: Y = Yes, Blank = No PT:   OT:   RN:   Equipment:   Other:      LOS: 3 days   Dellia Nims, MD 12/21/2013, 11:24 AM

## 2013-12-21 NOTE — Discharge Summary (Signed)
Name: [name] MRN: [id number] DOB: 06/30/1924 80 y.o. PCP: [name], DO  Date of Admission: 12/18/2013  3:28 PM Date of Discharge: 12/22/2013 Attending Physician: Annia Belt, MD  Discharge Diagnosis:  Active Problems:   Gait abnormality   Stroke-like symptoms   Cerebral embolism with cerebral infarction   Vitreous hemorrhage  Discharge Medications:   Medication List    STOP taking these medications       clopidogrel 75 MG tablet  Commonly known as:  PLAVIX      TAKE these medications        stroke: mapping our early stages of recovery book Misc  1 each by Does not apply route once.     atorvastatin 20 MG tablet  Commonly known as:  LIPITOR  Take 20 mg by mouth daily at 6 PM.     benazepril 5 MG tablet  Commonly known as:  LOTENSIN  Take 1 tablet (5 mg total) by mouth daily.     GERITOL PO  Take 1 tablet by mouth daily.     metoprolol tartrate 25 MG tablet  Commonly known as:  LOPRESSOR  Take 25 mg by mouth 2 (two) times daily.     omega-3 acid ethyl esters 1 G capsule  Commonly known as:  LOVAZA  Take 1 capsule (1 g total) by mouth daily.     Rivaroxaban 15 MG Tabs tablet  Commonly known as:  XARELTO  Take 1 tablet (15 mg total) by mouth daily with supper.     VITAMIN D PO  Take 1 tablet by mouth daily.        Disposition and follow-up:   Mr.[name] was discharged from [facility] in Stable condition.  At the hospital follow up visit please address:  1.  Please monitor his BP for medicine adjustments. We added ACEI low dose along with his bblocker.  He also needs podiatry referral.  2.  Labs / imaging needed at time of follow-up: none  3.  Pending labs/ test needing follow-up: none.  Follow-up Appointments: Follow-up Information   Follow up with [name], DO On 01/01/2014. (you have appointment at 3:15 PM.)    Specialty:  Internal Medicine   Contact information:   [address] [place] [address] [phone]       Follow up with [facility]. (home health physical therapy)    Specialty:  Home Health Services   Contact information:   [address] [place] [address] [phone]       Discharge Instructions: Discharge Instructions   Diet - low sodium heart healthy    Complete by:  As directed      Increase activity slowly    Complete by:  As directed            Consultations:    Procedures Performed:  Dg Chest 2 View  12/18/2013   CLINICAL DATA:  Weakness and unsteadiness.  No chest complaints.  EXAM: CHEST  2 VIEW  COMPARISON:  08/21/2013  FINDINGS: There is no focal parenchymal opacity, pleural effusion, or pneumothorax. Stable cardiomegaly. Stable prominence of the central pulmonary vasculature.  The osseous structures are unremarkable.  IMPRESSION: No active cardiopulmonary disease.   Electronically Signed   By: Kathreen Devoid   On: 12/18/2013 17:15   Ct Head Wo Contrast  12/18/2013   CLINICAL DATA:  Weakness  EXAM: CT HEAD WITHOUT CONTRAST  TECHNIQUE: Contiguous axial images were obtained from the base of the skull through the vertex without intravenous contrast.  COMPARISON:  07/08/2013  FINDINGS: There is a 2.7 cm hyperdense mass arising from the left sphenoid wing most consistent with a meningioma. There is no evidence of midline shift or extra-axial fluid collections. There is no evidence of other space-occupying lesion or intracranial hemorrhage. There is no evidence of a cortical-based area of acute infarction. There is an old right cerebellar infarct with encephalomalacia. There is generalized cerebral atrophy. There is periventricular white matter low attenuation likely secondary to microangiopathy.  The ventricles and sulci are appropriate for the patient's age. The basal cisterns are patent.  Visualized portions of the orbits are unremarkable. The visualized portions of the paranasal sinuses and mastoid air cells  are unremarkable. Cerebrovascular atherosclerotic calcifications are noted.  The osseous structures are unremarkable.  IMPRESSION: 1. No acute intracranial pathology. 2. Stable left sphenoid wing meningioma.   Electronically Signed   By: Kathreen Devoid   On: 12/18/2013 17:31   Mr [name] Head Wo Contrast  12/19/2013   CLINICAL DATA:  80 year old male who awoke 2 days ago and could not stand, falling to the right. Found have small acute on chronic left occipital infarct on MRI. Initial encounter.  EXAM: MRA HEAD WITHOUT CONTRAST  TECHNIQUE: Angiographic images of the Circle of Willis were obtained using MRA technique without intravenous contrast.  COMPARISON:  Brain MRI 12/18/2013.  Intracranial MRA 07/20/2011.  FINDINGS: Stable antegrade flow in the posterior circulation. No distal vertebral artery stenosis. Normal left PICA origin. Patent vertebrobasilar junction. Stable AICA origins. No basilar artery stenosis. Left SCA and PCA origins remain normal. There is improved right SCA flow since the prior. Bilateral PCA branches are stable and within normal limits.  Tortuous distal cervical ICAs. Stable antegrade flow in both ICA siphons. No siphon stenosis. Ophthalmic artery origins are within normal limits. Small posterior communicating arteries remain within normal limits. Stable and normal carotid termini. Dominant right ACA. Anterior communicating artery and visualized bilateral ACA branches are stable and within normal limits. Visualized bilateral MCA branches are stable and within normal limits.  IMPRESSION: Negative for age intracranial MRA.  Improved right superior cerebellar artery flow compared to the 2013 MRA (done at the time of a right SCA infarct).   Electronically Signed   By: Lars Pinks M.D.   On: 12/19/2013 13:40   Mr Brain Wo Contrast  12/18/2013   CLINICAL DATA:  80 year old male who awoke yesterday and could not stand, falling to the right. Symptoms progressing. Initial encounter.  Personal history of  left sphenoid wing meningioma.  EXAM: MRI HEAD WITHOUT CONTRAST  TECHNIQUE: Multiplanar, multiecho pulse sequences of the brain and surrounding structures were obtained without intravenous contrast.  COMPARISON:  Head CT without contrast 1716 hr today. Brain MRI 07/19/2011.  FINDINGS: Expected evolution of the right cerebellar infarct which occurred in 2013, now with widespread right cerebellar encephalomalacia. Underlying chronic bilateral cerebellar infarcts, progressed on the left since 2013.  Major intracranial vascular flow voids are stable.  Small cortically based restricted diffusion in the left occipital pole best seen on series 4, image 15. No other restricted diffusion. This occurs in a an area of cortical encephalomalacia. There are petechial blood products (series 8, image 71) which were not evident in 2013. No mass effect.  Chronic right occipital pole infarct appears stable. Widespread confluent cerebral white matter T2 and FLAIR hyperintensity has not significantly changed. Stable T2 heterogeneity in the deep  gray matter. Chronic left sphenoid wing meningioma is stable measuring 2-2.5 cm diameter.  Chronic micro hemorrhages elsewhere in the brain are more numerous, but might in part be visualized to better effect today due to 3 Tesla imaging.  No ventriculomegaly. Negative pituitary, cervicomedullary junction and visualized cervical spine. Normal bone marrow signal. Visible internal auditory structures appear normal. Visualized paranasal sinuses and mastoids are clear.  New signal changes in the left globe (series 6, image 8). Right orbit soft tissues appear stable and within normal limits.  IMPRESSION: 1. Small acute on chronic infarct in the left occipital pole, left PCA territory. Possible petechial hemorrhage, but no associated mass effect. 2. Advanced chronic ischemic and small vessel disease elsewhere, with progression since 2013. 3. Chronic left sphenoid wing meningioma. 4. New signal changes  in the left globe, query recent left vitreous hemorrhage or retinal/choroidal detachment.   Electronically Signed   By: Lars Pinks M.D.   On: 12/18/2013 21:39    2D Echo:   Left ventricle: The cavity size was normal. Wall thickness was increased in a pattern of moderate LVH. Systolic function was normal. The estimated ejection fraction was in the range of 50% to 55%. Although no diagnostic regional wall motion abnormality was identified, this possibility cannot be completely excluded on the basis of this study. Doppler parameters are consistent with abnormal left ventricular relaxation (grade 1 diastolic dysfunction). - Aortic valve: Mildly calcified annulus. Trileaflet. There was trivial regurgitation. - Mitral valve: There was trivial regurgitation. - Left atrium: The atrium was moderately to severely dilated. - Right atrium: The atrium was mildly dilated. Central venous pressure (est): 3 mm Hg. - Atrial septum: The interatrial septum was hypermobile. Possible prominent Eustachian valve versus right atrial cor triatriatum. No obvious.defect or patent foramen ovale was identified. - Tricuspid valve: There was trivial regurgitation. - Pulmonary arteries: PA peak pressure: 28 mm Hg (S). - Pericardium, extracardiac: There was no pericardial effusion.  Impressions:  - Moderate LVH with LVEF 19-62%, grade 1 diastolic dysfunction. Moderate to severe left atrial enlargement. Possible prominent Eustachian valve versus right atrial cor triatriatum. The interatrial septum was hypermobile without obvious PFO or shunt. Mild right atrial enlargement. Normal PASP 28 mmHg.  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  Mr. Lashley has trouble hearing, so history was obtained  from him and his daughter. His daughter reports that he has had difficulty standing since he woke up yesterday. This has progressed and was more severe today. She says that he starts to fall to the right side whenever he stands up. She thinks that his speech is slurred, but not changed from baseline. He had a cerebellar stroke in 2013. At that time, he was unwilling to walk and spent the whole day in bed, which alerted his daughter to call EMS. He had some mild residual right sided weakness, and he walks with a walker due to balance issues since his stroke. However, he is usually able stand by himself without falling to the side and walk with a walker. He has otherwise been doing well with good appetite and no fevers. His daughter says he has not been complaining of eye issues, but when asked he says that he can see "just a hair" in his left eye.  In the ER, his blood pressure was elevated initially to 201/106, and he had an episode of emesis in triage. Head CT showed no acute stroke, but MRI showed a small acute on chronic infarct in the left occipital pole. A possible vitreous hemorrhage vs. Retinal detachment was identified in his left eye. His meningioma was unchanged from prior imaging.    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  Had difficulty standing and tendency to lean to the right.   Possible TIA - Gait abnormality with dysarthria- improved.  - uses walker at baseline at home from previous stroke.  - CT normal but MRI showed small acute on chronic infarct of left occipital pole, left PCA territory, left globe ?viterous hemorrhage/retinal detachment.  - per neuro, his symptoms doesn't match the small infarct that's seen on  imaging. He likely had the episode of gait abnormality more than his baseline likely due to a TIA.  - stroke workup completed. ECHO completed shows EF 50-55% with grade 1 diastolic CHF. carotid doppler 1-39% ICA stenosis b/l. MRA negative for any changes.  - PT/OT SLP  -hgba1c 5.8. LDL 54, HDL 56.  - he was on xarelto for recurrent DVT and plavix for previous CVA. We will re-start lower dose 15mg  xarelto.  - will send to SNF for therapy.  #left viterous hemorrhage - seen on MRI  -doesn't appear to be recent per optho assessment. Recommended sleeping with head of bed elevated to allow viterous heme to settle and possibley improve his vision. Also has cataracts of both eyes. Will see Dr. Zadie Rhine next week as outpatient. appreciate optho recs. They are ok to restart anticoag.  #recurrent dvt  - has high risk of bleed given hx of unsteadiness. However, he is at risk of recurrent DVT's  - continue xarelto at lower dose 15mg  xarelto.  Hypertension  Blood pressure elevated in ER. Reported compliance.  -restarted his home metoprolol 25mg  BID then we added benazapril 5 mg daily.Marland Kitchen He was on this in the past but was d/ced b/c of hypotension previously. This will need to be followed outpatient.  Onychomycosis  - Needs podiatry outpatient  Discharge Vitals:   BP 147/87  Pulse 66  Temp(Src) 98 F (36.7 C) (Oral)  Resp 20  Ht 6' (1.829 m)  Wt 74.118 kg (163 lb 6.4 oz)  BMI 22.16 kg/m2  SpO2 97%  Discharge Labs:  No results found for this or any previous visit (from the past 24 hour(s)).  Signed: Dellia Nims, MD 12/22/2013, 11:30 AM    Services Ordered on Discharge: SNF Equipment Ordered on Discharge:

## 2013-12-21 NOTE — Progress Notes (Signed)
Patient ID: [name], male   DOB: 08-31-1924, 80 y.o.   MRN: [id number] Medicine attending discharge note I personally examined this patient today together with resident physician Dr. Dellia Nims and I concur with his evaluation and discharge management plan.   80 year old man 2 years status post cerebellar stroke who presented on the day of the current admission with sudden onset of ataxia and right leg weakness. He had no focal weakness on exam. Gait was  tested by physical therapy. He was able to walk 16 feet with minimal assistance using his walker on the day after admission. CT scan of the brain showed no acute abnormalities. MRI of the brain showed a small acute versus subacute left occipital lobe infarct. This did not correlate with his cerebellar symptoms. There was a fluid collection noted in the left globe. Question retinal detachment versus hemorrhage. He was seen in consultation by ophthalmology and it was felt that this was likely a vitreous hemorrhage. Acuity indeterminate. No contraindication to resuming anticoagulation. He was seen in consultation by neurology who supported the above recommendation to resume anticoagulation. He was put back on Xarelto at a slightly reduced dose of 15 mg in view of his age and renal function. (Estimated GFR 60 mL per minute). Additional studies included bilateral carotid artery Dopplers which showed 1-39% internal carotid artery stenosis bilaterally. Normal vertebral artery flow. Echocardiogram showed estimated ventricular ejection fraction 50-55%. Grade 1 diastolic dysfunction. Left atrial enlargement. Increased mobility of the anterior atrial septum. No suspicion of a PFO or ASD.  The patient is stable for discharge today. See discharge summary for additional details of his hospital course and plan.  Murriel Hopper, MD, [place]  Hematology-Oncology/Internal Medicine

## 2013-12-22 NOTE — Progress Notes (Addendum)
Clinical Social Work Department CLINICAL SOCIAL WORK PLACEMENT NOTE 12/22/2013  Patient:  [name], [name]  Account Number:  [id number] Admit date:  12/18/2013  Clinical Social Worker:  Adair Laundry  Date/time:  12/22/2013 09:30 AM  Clinical Social Work is seeking post-discharge placement for this patient at the following level of care:   SKILLED NURSING   (*CSW will update this form in Epic as items are completed)   12/22/2013  Patient/family provided with [place] Department of Clinical Social Work's list of facilities offering this level of care within the geographic area requested by the patient (or if unable, by the patient's family).  12/22/2013  Patient/family informed of their freedom to choose among providers that offer the needed level of care, that participate in Medicare, Medicaid or managed care program needed by the patient, have an available bed and are willing to accept the patient.  12/22/2013  Patient/family informed of [facility]' ownership interest in [facility], as well as of the fact that they are under no obligation to receive care at this facility.  PASARR submitted to EDS on  PASARR number received on   FL2 transmitted to all facilities in geographic area requested by pt/family on  12/22/2013 FL2 transmitted to all facilities within larger geographic area on   Patient informed that his/her managed care company has contracts with or will negotiate with  certain facilities, including the following:     Patient/family informed of bed offers received:  12/22/2013 Patient chooses bed at [facility] Physician recommends and patient chooses bed at    Patient to be transferred to   [facility]  12/22/2013 Patient to be transferred to facility by [place] Patient and family notified of transfer on 12/22/2013 Name of family member notified:  [name] (daughter)  The following physician request were entered in Epic: Physician Request   Please sign FL2.    Additional Comments: Preexisting Loretto Freeland, [place]

## 2013-12-22 NOTE — Progress Notes (Signed)
Subjective: Has no complaint today. Interacting as yesterday. Denies cp/sob/n/v/fever/chills/cough.  Objective: Vital signs in last 24 hours: Filed Vitals:   12/21/13 2014 12/21/13 2102 12/22/13 0000 12/22/13 0400  BP: 163/88  160/84 141/97  Pulse: 59 63 58 66  Temp: 98.4 F (36.9 C)  98.4 F (36.9 C) 98.5 F (36.9 C)  TempSrc: Oral  Oral Oral  Resp: 20  20 20   Height:      Weight:   74.118 kg (163 lb 6.4 oz)   SpO2: 99%  95% 99%   Weight change: 1.315 kg (2 lb 14.4 oz)  Intake/Output Summary (Last 24 hours) at 12/22/13 0730 Last data filed at 12/22/13 0543  Gross per 24 hour  Intake      0 ml  Output    650 ml  Net   -650 ml   Vitals reviewed. General: resting in bed, NAD HEENT: PERRL, EOMI, no scleral icterus.  Cardiac: RRR, no rubs, murmurs or gallops Pulm: clear to auscultation bilaterally, no wheezes, rales, or rhonchi Abd: soft, nontender, nondistended, BS present Ext: warm and well perfused, no pedal edema Neuro: alert, interactive, cranial nerves II-XII grossly intact, strength and sensation to light touch equal in bilateral upper and lower extremities. strenght 5/5 everywhere.  Lab Results: Basic Metabolic Panel:  Recent Labs Lab 12/18/13 1732 12/18/13 1750 12/20/13 0316  NA 143 141 142  K 4.1 3.8 3.6*  CL 104 110 104  CO2 25  --  27  GLUCOSE 100* 104* 98  BUN 16 15 15   CREATININE 1.02 1.10 1.20  CALCIUM 10.5  --  10.2   Liver Function Tests:  Recent Labs Lab 12/18/13 1732  AST 19  ALT 8  ALKPHOS 74  BILITOT 0.5  PROT 7.8  ALBUMIN 3.7   No results found for this basename: LIPASE, AMYLASE,  in the last 168 hours No results found for this basename: AMMONIA,  in the last 168 hours CBC:  Recent Labs Lab 12/18/13 1732 12/18/13 1750 12/20/13 0316  WBC 6.8  --  5.7  NEUTROABS 5.0  --   --   HGB 11.9* 12.6* 11.0*  HCT 36.1* 37.0* 33.7*  MCV 89.6  --  88.5  PLT 152  --  142*   Cardiac Enzymes:  Recent Labs Lab 12/19/13 0427    CKTOTAL 66   BNP: No results found for this basename: PROBNP,  in the last 168 hours D-Dimer: No results found for this basename: DDIMER,  in the last 168 hours CBG: No results found for this basename: GLUCAP,  in the last 168 hours Hemoglobin A1C:  Recent Labs Lab 12/19/13 0427  HGBA1C 5.8*   Fasting Lipid Panel:  Recent Labs Lab 12/19/13 0427  CHOL 119  HDL 56  LDLCALC 54  TRIG 45  CHOLHDL 2.1   Thyroid Function Tests: No results found for this basename: TSH, T4TOTAL, FREET4, T3FREE, THYROIDAB,  in the last 168 hours Coagulation:  Recent Labs Lab 12/18/13 1732  LABPROT 14.4  INR 1.12   Anemia Panel: No results found for this basename: VITAMINB12, FOLATE, FERRITIN, TIBC, IRON, RETICCTPCT,  in the last 168 hours Urine Drug Screen: Drugs of Abuse     Component Value Date/Time   LABOPIA NONE DETECTED 12/18/2013 1904   COCAINSCRNUR NONE DETECTED 12/18/2013 1904   LABBENZ NONE DETECTED 12/18/2013 1904   AMPHETMU NONE DETECTED 12/18/2013 1904   THCU NONE DETECTED 12/18/2013 1904   LABBARB NONE DETECTED 12/18/2013 1904    Alcohol Level:  Recent  Labs Lab 12/18/13 1732  ETH <11   Urinalysis:  Recent Labs Lab 12/18/13 1904  COLORURINE YELLOW  LABSPEC 1.014  PHURINE 7.5  GLUCOSEU NEGATIVE  HGBUR MODERATE*  BILIRUBINUR NEGATIVE  KETONESUR 15*  PROTEINUR NEGATIVE  UROBILINOGEN 1.0  NITRITE NEGATIVE  LEUKOCYTESUR NEGATIVE   Misc. Labs:  Micro Results: No results found for this or any previous visit (from the past 240 hour(s)). Studies/Results: No results found. Medications: I have reviewed the patient's current medications. Scheduled Meds: . atorvastatin  20 mg Oral q1800  . benazepril  5 mg Oral Daily  . metoprolol tartrate  25 mg Oral BID  . rivaroxaban  15 mg Oral Q supper   Continuous Infusions:  PRN Meds:. 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   Had difficulty standing and tendency to lean to the right.  Possible TIA - Gait abnormality with dysarthria- improved. - uses walker at baseline at home from previous stroke.  - CT normal but MRI showed small acute on chronic infarct of left occipital pole, left PCA territory, left globe ?viterous hemorrhage/retinal detachment.  - per neuro, his symptoms doesn't match the small infarct that's seen on imaging. He likely had the episode of gait abnormality more than his baseline likely due to a TIA.  - stroke workup completed.  ECHO completed shows EF 50-55% with grade 1 diastolic CHF. carotid doppler 1-39% ICA stenosis b/l. MRA negative for any changes.  - PT/OT SLP -hgba1c 5.8. LDL 54, HDL 56.  - he was on xarelto for recurrent DVT and plavix for previous CVA. We will re-start lower dose 15mg  xarelto. - will send to SNF for therapy.   #left viterous hemorrhage - seen on MRI  -doesn't appear to be recent per optho assessment. Recommended sleeping with head of bed elevated to allow viterous heme to settle and possibley improve his vision. Also has cataracts of both eyes. Will see Dr. Zadie Rhine next week as outpatient. appreciate optho recs. They are ok to restart anticoag.  #recurrent dvt  - has high risk of bleed given hx of unsteadiness. However, he is at risk of recurrent DVT's - continue xarelto at lower dose 15mg  xarelto.  Hypertension  Blood pressure elevated in ER. Reported compliance.  -restarted his home metoprolol 25mg  BID but BP still slightly high. Will start benazapril 10mg  daily. He was on this in the past but was d/ced b/c of hypotension previously. This will need to be followed outpatient.   Onychomycosis - Needs podiatry outpatient  Dispo: Disposition is deferred at this time, awaiting improvement of current medical  problems.  Anticipated discharge in approximately 2-3 day(s).   The patient does have a current PCP [name], DO) and does need an [facility] hospital follow-up appointment after discharge.  The patient does have transportation limitations that hinder transportation to clinic appointments.  .Services Needed at time of discharge: Y = Yes, Blank = No PT:   OT:   RN:   Equipment:   Other:     LOS: 4 days   Dellia Nims, MD 12/22/2013, 7:30 AM

## 2013-12-22 NOTE — Progress Notes (Signed)
OT Cancellation Note  Patient Details Name: [name] MRN: [id number] DOB: Dec 11, 1924   Cancelled Treatment:    Reason Eval/Treat Not Completed: Transport to SNF arriving, OT to defer all further OT needs to SNF.   [name] 12/22/2013, 3:22 PM

## 2013-12-22 NOTE — Discharge Summary (Signed)
See attending discharge note dictated on October 11. I personally examined this patient today October 12 on the day of official discharge. There have been no acute changes in his status. He'll be discharged to an extended care facility for short-term rehabilitation. He has a very supportive family.  Murriel Hopper, MD, [place]  Hematology-Oncology/Internal Medicine

## 2013-12-22 NOTE — Progress Notes (Signed)
Report given to LPN at [facility].  Daughter at bedside has helped pt get dressed and provided disposable briefs for pt to wear.  Awaiting for transport to pick up pt.

## 2013-12-22 NOTE — Progress Notes (Signed)
CSW (Clinical Education officer, museum) prepared pt dc packet and placed with shadow chart. CSW arranged non-emergent ambulance transport for 3pm as requested by family. Pt, pt family, pt nurse, and facility informed. CSW signing off.   [place], [place]

## 2013-12-22 NOTE — Progress Notes (Signed)
CSW (Clinical Education officer, museum) received call from [name] notifying that they can offer a bed for pt today. CSW notified pt daughter. [place] to coordinate with pt daughter to have paperwork completed. FL2 on chart for MD to sign.  [place], [place]

## 2013-12-23 ENCOUNTER — Non-Acute Institutional Stay (SKILLED_NURSING_FACILITY): Payer: Medicare Other | Admitting: Internal Medicine

## 2013-12-23 DIAGNOSIS — I82509 Chronic embolism and thrombosis of unspecified deep veins of unspecified lower extremity: Secondary | ICD-10-CM

## 2013-12-23 DIAGNOSIS — I6340 Cerebral infarction due to embolism of unspecified cerebral artery: Secondary | ICD-10-CM

## 2013-12-23 DIAGNOSIS — H4312 Vitreous hemorrhage, left eye: Secondary | ICD-10-CM

## 2013-12-24 NOTE — Progress Notes (Signed)
HISTORY & PHYSICAL  DATE: 12/23/2013   FACILITY: [facility]  LEVEL OF CARE: SNF (31)  ALLERGIES:  No Known Allergies  CHIEF COMPLAINT:  Manage CVA, DVT and hyperlipidemia  HISTORY OF PRESENT ILLNESS: 80 year old African American male was hospitalized secondary to a TIA. After hospitalization he is admitted to this facility for short-term rehabilitation.  CVA: The patient's CVA remains stable.  Patient denies new neurologic symptoms such as numbness, tingling, weakness, speech difficulties or visual disturbances.  No complications reported from the medications currently being used. He has difficulty with gait.  DVT: The DVTs remains stable.  Patient denies calf pain, chest pain or shortness of breath.  Patient is currently on anti-coagulation and does not report any complications from that. He has a history of recurrent DVT.  HYPERLIPIDEMIA: No complications from the medications presently being used. HDL 56, and LDL 54.  PAST MEDICAL HISTORY :  Past Medical History  Diagnosis Date  . Hypertension   . Difficulty hearing, right     BILATERAL HEARING LOSS - BEST TO TRY TO SPEAK INTO LEFT EAR  . Meningioma   . Frequent falls   . BPH (benign prostatic hypertrophy)     TURP 05/19/13  . Chronic kidney disease     CHRONIC KIDNEY DISEASE, 2  . Anemia   . Thrombocytopenia   . Stroke     Cerebellar, 2013; WALKS WITH WALKER, ABLE TO DRESS AND BATHE HIMSELF BUT FAMILY TRIES TO PROVIDE SUPERVISION BECAUSE OF HIS HX OF FALL AND WEAKNESS LEGS, ARMS   . Incontinence of urine     SOME INCONTINENCE  . Hyperlipidemia   . DVT (deep venous thrombosis)     PAST SURGICAL HISTORY: Past Surgical History  Procedure Laterality Date  . Tube put in ear yrs ago    . Transurethral resection of prostate N/A 05/19/2013    Procedure: TRANSURETHRAL RESECTION OF THE PROSTATE WITH GYRUS INSTRUMENTS;  Surgeon: Ailene Rud, MD;  Location: [facility];  Service: Urology;   Laterality: N/A;  . Cystoscopy N/A 06/13/2013    Procedure: CYSTOSCOPY FLEXIBLE BEDSIDE;  Surgeon: Ardis Hughs, MD;  Location: [facility];  Service: Urology;  Laterality: N/A;    SOCIAL HISTORY:  reports that he has quit smoking. His smoking use included Cigarettes. He smoked 0.00 packs per day. He does not have any smokeless tobacco history on file. He reports that he does not drink alcohol or use illicit drugs.  FAMILY HISTORY:  Family History  Problem Relation Age of Onset  . Diabetes Mother   . Heart disease Mother   . Hypertension Mother   . Heart disease Father   . Hypertension Father   . Hypertension Daughter     CURRENT MEDICATIONS: Reviewed per MAR/see medication list  REVIEW OF SYSTEMS:  See HPI otherwise 14 point ROS is negative.  PHYSICAL EXAMINATION  VS:  See VS section  GENERAL: no acute distress, normal body habitus EYES: conjunctivae normal, sclerae normal, normal eye lids MOUTH/THROAT: lips without lesions,no lesions in the mouth,tongue is without lesions,uvula elevates in midline NECK: supple, trachea midline, no neck masses, no thyroid tenderness, no thyromegaly LYMPHATICS: no LAN in the neck, no supraclavicular LAN RESPIRATORY: breathing is even & unlabored, BS CTAB CARDIAC: RRR, no murmur,no extra heart sounds, no edema GI:  ABDOMEN: abdomen soft, normal BS, no masses, no tenderness  LIVER/SPLEEN: no hepatomegaly, no splenomegaly MUSCULOSKELETAL: HEAD: normal to inspection  EXTREMITIES: LEFT UPPER EXTREMITY: Moderate range  of motion, normal strength & tone RIGHT UPPER EXTREMITY: Moderate range of motion, normal strength & tone LEFT LOWER EXTREMITY:  Moderate range of motion, normal strength & tone RIGHT LOWER EXTREMITY:  Moderate range of motion, normal strength & tone PSYCHIATRIC: the patient is alert & oriented to person, affect & behavior appropriate  LABS/RADIOLOGY:  Labs reviewed: Basic Metabolic Panel:  Recent Labs  05/16/13 1455  06/10/13 1437  09/10/13 1707 12/18/13 1732 12/18/13 1750 12/20/13 0316  NA 146 143  < > 143 143 141 142  K 3.6* 4.7  < > 4.3 4.1 3.8 3.6*  CL 106 106  < > 106 104 110 104  CO2 31 26  < > 25 25  --  27  GLUCOSE 118* 113*  < > 91 100* 104* 98  BUN 14 23  < > 15 16 15 15   CREATININE 1.03 1.13  < > 1.06 1.02 1.10 1.20  CALCIUM 11.1* 11.4*  < > 9.9 10.5  --  10.2  MG  --  2.0  --   --   --   --   --   < > = values in this interval not displayed. Liver Function Tests:  Recent Labs  08/21/13 1449 09/10/13 1707 12/18/13 1732  AST 19 16 19   ALT 8 9 8   ALKPHOS 68 67 74  BILITOT 0.4 0.3 0.5  PROT 7.5 6.6 7.8  ALBUMIN 3.4* 3.1* 3.7   CBC:  Recent Labs  08/15/13 1654 08/21/13 1449 12/18/13 1732 12/18/13 1750 12/20/13 0316  WBC 7.4 8.5 6.8  --  5.7  NEUTROABS 4.4 6.6 5.0  --   --   HGB 10.7* 10.6* 11.9* 12.6* 11.0*  HCT 34.1* 32.9* 36.1* 37.0* 33.7*  MCV 93.2 94.0 89.6  --  88.5  PLT 198 131* 152  --  142*   Lipid Panel:  Recent Labs  12/19/13 0427  HDL 56   Cardiac Enzymes:  Recent Labs  07/08/13 1309 07/08/13 1900 07/09/13 0048 12/19/13 0427  CKTOTAL  --   --   --  25  TROPONINI <0.30 <0.30 <0.30  --    CBG:  Recent Labs  07/08/13 1331  GLUCAP 154*    Transthoracic Echocardiography  Patient:    Yousaf, Sainato MR #:       [id number] Study Date: 12/20/2013 Gender:     M Age:        19 Height:     182.9 cm Weight:     73.8 kg BSA:        1.93 m^2 Pt. Status: Room:       3W19C   Marshia Ly  ATTENDING    Mackie Pai  SONOGRAPHER  Melissa Morford, RDCS  PERFORMING   [facility], Inpatient  cc:  ------------------------------------------------------------------- LV EF: 50% -   55%  ------------------------------------------------------------------- Indications:      CVA 32.  ------------------------------------------------------------------- History:   Risk factors:  Hypertension.  Dyslipidemia.  ------------------------------------------------------------------- Study Conclusions  - Left ventricle: The cavity size was normal. Wall thickness was   increased in a pattern of moderate LVH. Systolic function was   normal. The estimated ejection fraction was in the range of 50%   to 55%. Although no diagnostic regional wall motion abnormality   was identified, this possibility cannot be completely excluded on   the basis of this study. Doppler parameters are consistent with   abnormal left ventricular relaxation (grade 1 diastolic   dysfunction). -  Aortic valve: Mildly calcified annulus. Trileaflet. There was   trivial regurgitation. - Mitral valve: There was trivial regurgitation. - Left atrium: The atrium was moderately to severely dilated. - Right atrium: The atrium was mildly dilated. Central venous   pressure (est): 3 mm Hg. - Atrial septum: The interatrial septum was hypermobile. Possible   prominent Eustachian valve versus right atrial cor triatriatum.   No obvious.defect or patent foramen ovale was identified. - Tricuspid valve: There was trivial regurgitation. - Pulmonary arteries: PA peak pressure: 28 mm Hg (S). - Pericardium, extracardiac: There was no pericardial effusion.  Impressions:  - Moderate LVH with LVEF 37-16%, grade 1 diastolic dysfunction.   Moderate to severe left atrial enlargement. Possible prominent   Eustachian valve versus right atrial cor triatriatum. The   interatrial septum was hypermobile without obvious PFO or shunt.   Mild right atrial enlargement. Normal PASP 28 mmHg.  Transthoracic echocardiography.  M-mode, complete 2D, spectral Doppler, and color Doppler.  Birthdate:  Patient birthdate: 1925/03/08.  Age:  Patient is 80 yr old.  Sex:  Gender: male. BMI: 22.1 kg/m^2.  Blood pressure:     163/95  Patient status: Inpatient.  Study date:  Study date: 12/20/2013. Study time: 02:12 PM.  Location:   Bedside.  -------------------------------------------------------------------  ------------------------------------------------------------------- Left ventricle:  The cavity size was normal. Wall thickness was increased in a pattern of moderate LVH. Systolic function was normal. The estimated ejection fraction was in the range of 50% to 55%. Although no diagnostic regional wall motion abnormality was identified, this possibility cannot be completely excluded on the basis of this study. Doppler parameters are consistent with abnormal left ventricular relaxation (grade 1 diastolic dysfunction).  ------------------------------------------------------------------- Aortic valve:   Mildly calcified annulus. Trileaflet. Cusp separation was normal.  Doppler:  There was trivial regurgitation.   ------------------------------------------------------------------- Aorta:  Aortic root: The aortic root was normal in size.  ------------------------------------------------------------------- Mitral valve:   The valve appears to be grossly normal.    Doppler:  There was trivial regurgitation.  ------------------------------------------------------------------- Left atrium:  The atrium was moderately to severely dilated.  ------------------------------------------------------------------- Atrial septum:  The interatrial septum was hypermobile. Possible prominent Eustachian valve versus right atrial cor triatriatum. No obvious.defect or patent foramen ovale was identified.  ------------------------------------------------------------------- Right ventricle:  The cavity size was normal. Systolic function was normal.  ------------------------------------------------------------------- Pulmonic valve:   Poorly visualized.  The valve appears to be grossly normal.    Doppler:  There was trivial regurgitation.  ------------------------------------------------------------------- Tricuspid valve:    The valve appears to be grossly normal. Doppler:  There was trivial regurgitation.  ------------------------------------------------------------------- Right atrium:  The atrium was mildly dilated.  ------------------------------------------------------------------- Pericardium:  There was no pericardial effusion.  ------------------------------------------------------------------- Systemic veins: Inferior vena cava: The vessel was normal in size. The respirophasic diameter changes were in the normal range (= 50%), consistent with normal central venous pressure.  ------------------------------------------------------------------- Measurements   Left ventricle                           Value        Reference  LV ID, ED, PLAX chordal          (L)     41.6  mm     43 - 52  LV ID, ES, PLAX chordal                  35.7  mm     23 - 38  LV fx  shortening, PLAX chordal   (L)     14    %      >=29  LV PW thickness, ED                      16.7  mm     ---------  IVS/LV PW ratio, ED                      0.92         <=1.3  LV e&', lateral                           7.18  cm/s   ---------  LV E/e&', lateral                         7.49         ---------    Ventricular septum                       Value        Reference  IVS thickness, ED                        15.3  mm     ---------    Aortic valve                             Value        Reference  Aortic regurg pressure half-time         951   ms     ---------    Aorta                                    Value        Reference  Aortic root ID, ED                       39    mm     ---------    Left atrium                              Value        Reference  LA ID, A-P, ES                           37    mm     ---------  LA ID/bsa, A-P                           1.91  cm/m^2 <=2.2  LA volume, ES, 1-p A4C                   83.9  ml     ---------  LA volume/bsa, ES, 1-p A4C               43.4  ml/m^2 ---------  LA volume, ES, 1-p A2C                    89.8  ml     ---------  LA volume/bsa, ES, 1-p A2C  46.4  ml/m^2 ---------    Mitral valve                             Value        Reference  Mitral E-wave peak velocity              53.8  cm/s   ---------  Mitral A-wave peak velocity              89.2  cm/s   ---------  Mitral deceleration time         (H)     377   ms     150 - 230  Mitral E/A ratio, peak                   0.6          ---------    Pulmonary arteries                       Value        Reference  PA pressure, S, DP                       28    mm Hg  <=30    Tricuspid valve                          Value        Reference  Tricuspid regurg peak velocity           251   cm/s   ---------  Tricuspid peak RV-RA gradient            25    mm Hg  ---------    Systemic veins                           Value        Reference  Estimated CVP                            3     mm Hg  ---------    Right ventricle                          Value        Reference  RV pressure, S, DP                       28    mm Hg  <=30  Carotid Duplex Study  Patient:    Thomson, Herbers MR #:       [id number] Study Date: 12/20/2013 Gender:     M Age:        21 Height: Weight: BSA: Pt. Status: Room:       3W19C   Elder Cyphers  ATTENDING    Sullivan Lone  REFERRING    Annia Belt  READING      Pramod Sethi  SONOGRAPHER  Guinevere Ferrari, RVT  Reports also to:  ------------------------------------------------------------------- History and indications:  History  R26.9 Gait abnormality. I63.9 CVA. Prior study from 07-21-11 is available for comparison. Prior R ICA results: 0-39% Prior L ICA results:  0-39%  ------------------------------------------------------------------- Study information:  Study status:  Routine.  Procedure:  The right CCA, right ECA, right ICA, right vertebral, left CCA, left ECA, left ICA, and left vertebral arteries  were examined. A vascular evaluation was performed with the patient in the supine position. Image quality was poor due to the patient&'s position. The patient is unable to turn his head more to the left.    Carotid duplex study. Carotid Duplex exam including 2D imaging, color and spectral Doppler were performed on the extracranial carotid and vertebral arteries using standard established protocols.  Birthdate:  Patient birthdate: [birth year].  Age:  Patient is 80 yr old.  Sex:  Gender: male.  Study date:  Study date: 12/20/2013. Study time: 03:24 PM. Location:  Bedside.  Patient status:  Inpatient.  Arterial flow:  +-------------------+--------+-------+----------------------------+ Location           V sys   V ed   Comment                      +-------------------+--------+-------+----------------------------+ Right CCA -        94 cm/s 13 cm/sMild intimal wall            proximal                          thickening.                  +-------------------+--------+-------+----------------------------+ Right CCA - distal -36 cm/s-7 cm/sMild intimal wall                                              thickening.                  +-------------------+--------+-------+----------------------------+ Right ECA          -48 cm/s-8 cm/s---------------------------- +-------------------+--------+-------+----------------------------+ Right ICA -        -17 cm/s-4 cm/s---------------------------- proximal                                                       +-------------------+--------+-------+----------------------------+ Right ICA - distal -24 cm/s-8 cm/s---------------------------- +-------------------+--------+-------+----------------------------+ Right vertebral    -56 cm/s-13    ----------------------------                            cm/s                                 +-------------------+--------+-------+----------------------------+ Left CCA - proximal61 cm/s 7 cm/s Mild intimal wall                                              thickening.                  +-------------------+--------+-------+----------------------------+ Left CCA - distal  -52 cm/s-9 cm/sMild intimal wall  thickening.                  +-------------------+--------+-------+----------------------------+ Left ECA           -62 cm/s-12    ----------------------------                            cm/s                                +-------------------+--------+-------+----------------------------+ Left ICA - proximal-40 cm/s-12    Mild tortuosity.                                        cm/s                                +-------------------+--------+-------+----------------------------+ Left ICA - distal  -42 cm/s-13    ----------------------------                            cm/s                                +-------------------+--------+-------+----------------------------+ Left vertebral     24 cm/s 6 cm/s ---------------------------- +-------------------+--------+-------+----------------------------+  ------------------------------------------------------------------- Summary: Bilateral: 1-39% ICA stenosis. Vertebral artery flow is antegrade.  CHEST  2 VIEW   COMPARISON:  08/21/2013   FINDINGS: There is no focal parenchymal opacity, pleural effusion, or pneumothorax. Stable cardiomegaly. Stable prominence of the central pulmonary vasculature.   The osseous structures are unremarkable.   IMPRESSION: No active cardiopulmonary disease.   CT HEAD WITHOUT CONTRAST   TECHNIQUE: Contiguous axial images were obtained from the base of the skull through the vertex without intravenous contrast.   COMPARISON:  07/08/2013   FINDINGS: There is a 2.7 cm hyperdense mass arising  from the left sphenoid wing most consistent with a meningioma. There is no evidence of midline shift or extra-axial fluid collections. There is no evidence of other space-occupying lesion or intracranial hemorrhage. There is no evidence of a cortical-based area of acute infarction. There is an old right cerebellar infarct with encephalomalacia. There is generalized cerebral atrophy. There is periventricular white matter low attenuation likely secondary to microangiopathy.   The ventricles and sulci are appropriate for the patient's age. The basal cisterns are patent.   Visualized portions of the orbits are unremarkable. The visualized portions of the paranasal sinuses and mastoid air cells are unremarkable. Cerebrovascular atherosclerotic calcifications are noted.   The osseous structures are unremarkable.   IMPRESSION: 1. No acute intracranial pathology. 2. Stable left sphenoid wing meningioma.       TECHNIQUE: Multiplanar, multiecho pulse sequences of the brain and surrounding structures were obtained without intravenous contrast.   COMPARISON:  Head CT without contrast 1716 hr today. Brain MRI 07/19/2011.   FINDINGS: Expected evolution of the right cerebellar infarct which occurred in 2013, now with widespread right cerebellar encephalomalacia. Underlying chronic bilateral cerebellar infarcts, progressed on the left since 2013.   Major intracranial vascular flow voids are stable.   Small cortically based restricted diffusion in the left occipital pole best seen on series 4, image 15. No other restricted diffusion. This occurs in a an  area of cortical encephalomalacia. There are petechial blood products (series 8, image 71) which were not evident in 2013. No mass effect.   Chronic right occipital pole infarct appears stable. Widespread confluent cerebral white matter T2 and FLAIR hyperintensity has not significantly changed. Stable T2 heterogeneity in the deep  gray matter. Chronic left sphenoid wing meningioma is stable measuring 2-2.5 cm diameter.   Chronic micro hemorrhages elsewhere in the brain are more numerous, but might in part be visualized to better effect today due to 3 Tesla imaging.   No ventriculomegaly. Negative pituitary, cervicomedullary junction and visualized cervical spine. Normal bone marrow signal. Visible internal auditory structures appear normal. Visualized paranasal sinuses and mastoids are clear.   New signal changes in the left globe (series 6, image 8). Right orbit soft tissues appear stable and within normal limits.   IMPRESSION: 1. Small acute on chronic infarct in the left occipital pole, left PCA territory. Possible petechial hemorrhage, but no associated mass effect. 2. Advanced chronic ischemic and small vessel disease elsewhere, with progression since 2013. 3. Chronic left sphenoid wing meningioma. 4. New signal changes in the left globe, query recent left vitreous hemorrhage or retinal/choroidal detachment.   MRA HEAD WITHOUT CONTRAST   TECHNIQUE: Angiographic images of the Circle of Willis were obtained using MRA technique without intravenous contrast.   COMPARISON:  Brain MRI 12/18/2013.  Intracranial MRA 07/20/2011.   FINDINGS: Stable antegrade flow in the posterior circulation. No distal vertebral artery stenosis. Normal left PICA origin. Patent vertebrobasilar junction. Stable AICA origins. No basilar artery stenosis. Left SCA and PCA origins remain normal. There is improved right SCA flow since the prior. Bilateral PCA branches are stable and within normal limits.   Tortuous distal cervical ICAs. Stable antegrade flow in both ICA siphons. No siphon stenosis. Ophthalmic artery origins are within normal limits. Small posterior communicating arteries remain within normal limits. Stable and normal carotid termini. Dominant right ACA. Anterior communicating artery and visualized bilateral  ACA branches are stable and within normal limits. Visualized bilateral MCA branches are stable and within normal limits.   IMPRESSION: Negative for age intracranial MRA.   Improved right superior cerebellar artery flow compared to the 2013 MRA (done at the time of a right SCA infarct).     ASSESSMENT/PLAN:  CVA-continue rehabilitation Hyperlipidemia-well controlled Recurrent DVT-continue xarelto Left vetrous hemorrhage-has appointment with ophthalmologist. Hypertension-blood pressure is elevated. Will monitor. Check CBC  I have reviewed patient's medical records received at admission/from hospitalization.  CPT CODE: 09983  Terryl Molinelli Y [address] [place], [place] [phone]

## 2014-01-01 ENCOUNTER — Encounter: Payer: Medicare Other | Admitting: Internal Medicine

## 2014-01-09 ENCOUNTER — Non-Acute Institutional Stay (SKILLED_NURSING_FACILITY): Payer: Medicare Other | Admitting: Adult Health

## 2014-01-09 ENCOUNTER — Encounter: Payer: Self-pay | Admitting: Adult Health

## 2014-01-09 DIAGNOSIS — I10 Essential (primary) hypertension: Secondary | ICD-10-CM

## 2014-01-09 DIAGNOSIS — I82509 Chronic embolism and thrombosis of unspecified deep veins of unspecified lower extremity: Secondary | ICD-10-CM

## 2014-01-09 DIAGNOSIS — E785 Hyperlipidemia, unspecified: Secondary | ICD-10-CM

## 2014-01-09 DIAGNOSIS — I6340 Cerebral infarction due to embolism of unspecified cerebral artery: Secondary | ICD-10-CM

## 2014-01-09 NOTE — Progress Notes (Signed)
Patient ID: IBAN UTZ, male   DOB: [birth year], 80 y.o.   MRN: [id number]              PROGRESS NOTE  DATE: 01/09/2014   FACILITY: [facility]  LEVEL OF CARE: SNF (31)   Discharge Notes  HISTORY OF PRESENT ILLNESS:   This is an 80 year old male who is for dsicharge home with home health PT, OT, nursing and CNA. He has been admitted to [facility] on 12/22/13 from [facility] with small acute chronic infarct in the left occipital pole.  Patient was admitted to this facility for short-term rehabilitation after the patient's recent hospitalization.  Patient has completed SNF rehabilitation and therapy has cleared the patient for discharge.  Reassessment of ongoing problem(s):  DVT: The DVTs remains stable.  Patient denies calf pain, chest pain or shortness of breath.  Patient is currently on anti-coagulation and does not report any complications from that.  HTN: Pt 's HTN remains stable.  Denies CP, sob, DOE, pedal edema, headaches, dizziness or visual disturbances.  No complications from the medications currently being used.  Last BP : 126/66  HYPERLIPIDEMIA: No complications from the medications presently being used. 10/15 fasting lipid panel showed : Cholesterol 119 triglycerides 45 HDL 56 LDL 54   Past Medical History  Diagnosis Date  . Hypertension   . Difficulty hearing, right     BILATERAL HEARING LOSS - BEST TO TRY TO SPEAK INTO LEFT EAR  . Meningioma   . Frequent falls   . BPH (benign prostatic hypertrophy)     TURP 05/19/13  . Chronic kidney disease     CHRONIC KIDNEY DISEASE, 2  . Anemia   . Thrombocytopenia   . Stroke     Cerebellar, 2013; WALKS WITH WALKER, ABLE TO DRESS AND BATHE HIMSELF BUT FAMILY TRIES TO PROVIDE SUPERVISION BECAUSE OF HIS HX OF FALL AND WEAKNESS LEGS, ARMS   . Incontinence of urine     SOME INCONTINENCE  . Hyperlipidemia   . DVT (deep venous thrombosis)      Reviewed.  No changes/see problem list  CURRENT  MEDICATIONS: Reviewed per MAR/see medication list   No Known Allergies   REVIEW OF SYSTEMS:  GENERAL: no change in appetite, no fatigue, no weight changes, no fever, chills or weakness RESPIRATORY: no cough, SOB, DOE, wheezing, hemoptysis CARDIAC: no chest pain, edema or palpitations GI: no abdominal pain, diarrhea, constipation, heart burn, nausea or vomiting  PHYSICAL EXAMINATION  GENERAL: no acute distress, normal body habitus EYES: conjunctivae normal, sclerae normal, normal eye lids NECK: supple, trachea midline, no neck masses, no thyroid tenderness, no thyromegaly LYMPHATICS: no LAN in the neck, no supraclavicular LAN RESPIRATORY: breathing is even & unlabored, BS CTAB CARDIAC: RRR, no murmur,no extra heart sounds, no edema GI: abdomen soft, normal BS, no masses, no tenderness, no hepatomegaly, no splenomegaly EXTREMITIES: able to move all 4 extremities; uses rolling walker PSYCHIATRIC: the patient is alert & oriented to person, affect & behavior appropriate  LABS/RADIOLOGY: Labs reviewed: Basic Metabolic Panel:  Recent Labs  05/16/13 1455 06/10/13 1437  09/10/13 1707 12/18/13 1732 12/18/13 1750 12/20/13 0316  NA 146 143  < > 143 143 141 142  K 3.6* 4.7  < > 4.3 4.1 3.8 3.6*  CL 106 106  < > 106 104 110 104  CO2 31 26  < > 25 25  --  27  GLUCOSE 118* 113*  < > 91 100* 104* 98  BUN 14  23  < > 15 16 15 15   CREATININE 1.03 1.13  < > 1.06 1.02 1.10 1.20  CALCIUM 11.1* 11.4*  < > 9.9 10.5  --  10.2  MG  --  2.0  --   --   --   --   --   < > = values in this interval not displayed.  Liver Function Tests:  Recent Labs  08/21/13 1449 09/10/13 1707 12/18/13 1732  AST 19 16 19   ALT 8 9 8   ALKPHOS 68 67 74  BILITOT 0.4 0.3 0.5  PROT 7.5 6.6 7.8  ALBUMIN 3.4* 3.1* 3.7   CBC:  Recent Labs  08/15/13 1654 08/21/13 1449 12/18/13 1732 12/18/13 1750 12/20/13 0316  WBC 7.4 8.5 6.8  --  5.7  NEUTROABS 4.4 6.6 5.0  --   --   HGB 10.7* 10.6* 11.9* 12.6* 11.0*    HCT 34.1* 32.9* 36.1* 37.0* 33.7*  MCV 93.2 94.0 89.6  --  88.5  PLT 198 131* 152  --  142*   Lipid Panel:  Recent Labs  12/19/13 0427  HDL 56   Cardiac Enzymes:  Recent Labs  07/08/13 1309 07/08/13 1900 07/09/13 0048 12/19/13 0427  CKTOTAL  --   --   --  24  TROPONINI <0.30 <0.30 <0.30  --    CBG:  Recent Labs  07/08/13 1331  GLUCAP 154*    Dg Chest 2 View  12/18/2013   CLINICAL DATA:  Weakness and unsteadiness.  No chest complaints.  EXAM: CHEST  2 VIEW  COMPARISON:  08/21/2013  FINDINGS: There is no focal parenchymal opacity, pleural effusion, or pneumothorax. Stable cardiomegaly. Stable prominence of the central pulmonary vasculature.  The osseous structures are unremarkable.  IMPRESSION: No active cardiopulmonary disease.   Electronically Signed   By: Kathreen Devoid   On: 12/18/2013 17:15   Ct Head Wo Contrast  12/18/2013   CLINICAL DATA:  Weakness  EXAM: CT HEAD WITHOUT CONTRAST  TECHNIQUE: Contiguous axial images were obtained from the base of the skull through the vertex without intravenous contrast.  COMPARISON:  07/08/2013  FINDINGS: There is a 2.7 cm hyperdense mass arising from the left sphenoid wing most consistent with a meningioma. There is no evidence of midline shift or extra-axial fluid collections. There is no evidence of other space-occupying lesion or intracranial hemorrhage. There is no evidence of a cortical-based area of acute infarction. There is an old right cerebellar infarct with encephalomalacia. There is generalized cerebral atrophy. There is periventricular white matter low attenuation likely secondary to microangiopathy.  The ventricles and sulci are appropriate for the patient's age. The basal cisterns are patent.  Visualized portions of the orbits are unremarkable. The visualized portions of the paranasal sinuses and mastoid air cells are unremarkable. Cerebrovascular atherosclerotic calcifications are noted.  The osseous structures are unremarkable.   IMPRESSION: 1. No acute intracranial pathology. 2. Stable left sphenoid wing meningioma.   Electronically Signed   By: Kathreen Devoid   On: 12/18/2013 17:31   Mr [name] Head Wo Contrast  12/19/2013   CLINICAL DATA:  80 year old male who awoke 2 days ago and could not stand, falling to the right. Found have small acute on chronic left occipital infarct on MRI. Initial encounter.  EXAM: MRA HEAD WITHOUT CONTRAST  TECHNIQUE: Angiographic images of the Circle of Willis were obtained using MRA technique without intravenous contrast.  COMPARISON:  Brain MRI 12/18/2013.  Intracranial MRA 07/20/2011.  FINDINGS: Stable antegrade flow in the posterior circulation.  No distal vertebral artery stenosis. Normal left PICA origin. Patent vertebrobasilar junction. Stable AICA origins. No basilar artery stenosis. Left SCA and PCA origins remain normal. There is improved right SCA flow since the prior. Bilateral PCA branches are stable and within normal limits.  Tortuous distal cervical ICAs. Stable antegrade flow in both ICA siphons. No siphon stenosis. Ophthalmic artery origins are within normal limits. Small posterior communicating arteries remain within normal limits. Stable and normal carotid termini. Dominant right ACA. Anterior communicating artery and visualized bilateral ACA branches are stable and within normal limits. Visualized bilateral MCA branches are stable and within normal limits.  IMPRESSION: Negative for age intracranial MRA.  Improved right superior cerebellar artery flow compared to the 2013 MRA (done at the time of a right SCA infarct).   Electronically Signed   By: Lars Pinks M.D.   On: 12/19/2013 13:40   Mr Brain Wo Contrast  12/18/2013   CLINICAL DATA:  80 year old male who awoke yesterday and could not stand, falling to the right. Symptoms progressing. Initial encounter.  Personal history of left sphenoid wing meningioma.  EXAM: MRI HEAD WITHOUT CONTRAST  TECHNIQUE: Multiplanar, multiecho pulse sequences of  the brain and surrounding structures were obtained without intravenous contrast.  COMPARISON:  Head CT without contrast 1716 hr today. Brain MRI 07/19/2011.  FINDINGS: Expected evolution of the right cerebellar infarct which occurred in 2013, now with widespread right cerebellar encephalomalacia. Underlying chronic bilateral cerebellar infarcts, progressed on the left since 2013.  Major intracranial vascular flow voids are stable.  Small cortically based restricted diffusion in the left occipital pole best seen on series 4, image 15. No other restricted diffusion. This occurs in a an area of cortical encephalomalacia. There are petechial blood products (series 8, image 71) which were not evident in 2013. No mass effect.  Chronic right occipital pole infarct appears stable. Widespread confluent cerebral white matter T2 and FLAIR hyperintensity has not significantly changed. Stable T2 heterogeneity in the deep gray matter. Chronic left sphenoid wing meningioma is stable measuring 2-2.5 cm diameter.  Chronic micro hemorrhages elsewhere in the brain are more numerous, but might in part be visualized to better effect today due to 3 Tesla imaging.  No ventriculomegaly. Negative pituitary, cervicomedullary junction and visualized cervical spine. Normal bone marrow signal. Visible internal auditory structures appear normal. Visualized paranasal sinuses and mastoids are clear.  New signal changes in the left globe (series 6, image 8). Right orbit soft tissues appear stable and within normal limits.  IMPRESSION: 1. Small acute on chronic infarct in the left occipital pole, left PCA territory. Possible petechial hemorrhage, but no associated mass effect. 2. Advanced chronic ischemic and small vessel disease elsewhere, with progression since 2013. 3. Chronic left sphenoid wing meningioma. 4. New signal changes in the left globe, query recent left vitreous hemorrhage or retinal/choroidal detachment.   Electronically Signed   By:  Lars Pinks M.D.   On: 12/18/2013 21:39    ASSESSMENT/PLAN:  CVA - for home health PT, OT, nursing and CNA Recurrent DVT - continue Xarelto Hypertension - well controlled; continue Lotensin and Lopressor Hyperlipidemia - continue Lipitor and Lovaza   I have filled out patient's discharge paperwork and written prescriptions.  Patient will receive home health PT, OT, Nursing and CNA.  Total discharge time: Less than 30 minutes  Discharge time involved coordination of the discharge process with Education officer, museum, nursing staff and therapy department. Medical justification for home health services verified.  CPT CODE: 81157  MEDINA-VARGAS,MONINA, NP  [place] [phone]

## 2014-01-15 ENCOUNTER — Ambulatory Visit (INDEPENDENT_AMBULATORY_CARE_PROVIDER_SITE_OTHER): Payer: Medicare Other | Admitting: Internal Medicine

## 2014-01-15 ENCOUNTER — Encounter: Payer: Self-pay | Admitting: Internal Medicine

## 2014-01-15 VITALS — BP 167/89 | HR 61 | Temp 98.0°F | Ht 71.0 in | Wt 171.0 lb

## 2014-01-15 DIAGNOSIS — N183 Chronic kidney disease, stage 3 unspecified: Secondary | ICD-10-CM

## 2014-01-15 DIAGNOSIS — N400 Enlarged prostate without lower urinary tract symptoms: Secondary | ICD-10-CM

## 2014-01-15 DIAGNOSIS — Z23 Encounter for immunization: Secondary | ICD-10-CM

## 2014-01-15 DIAGNOSIS — E210 Primary hyperparathyroidism: Secondary | ICD-10-CM

## 2014-01-15 DIAGNOSIS — I6340 Cerebral infarction due to embolism of unspecified cerebral artery: Secondary | ICD-10-CM

## 2014-01-15 DIAGNOSIS — I82503 Chronic embolism and thrombosis of unspecified deep veins of lower extremity, bilateral: Secondary | ICD-10-CM

## 2014-01-15 DIAGNOSIS — I10 Essential (primary) hypertension: Secondary | ICD-10-CM

## 2014-01-15 DIAGNOSIS — B351 Tinea unguium: Secondary | ICD-10-CM | POA: Insufficient documentation

## 2014-01-15 DIAGNOSIS — I129 Hypertensive chronic kidney disease with stage 1 through stage 4 chronic kidney disease, or unspecified chronic kidney disease: Secondary | ICD-10-CM

## 2014-01-15 DIAGNOSIS — Z7901 Long term (current) use of anticoagulants: Secondary | ICD-10-CM

## 2014-01-15 DIAGNOSIS — I82509 Chronic embolism and thrombosis of unspecified deep veins of unspecified lower extremity: Secondary | ICD-10-CM

## 2014-01-15 LAB — BASIC METABOLIC PANEL WITH GFR
BUN: 19 mg/dL (ref 6–23)
CO2: 29 mEq/L (ref 19–32)
Calcium: 10.2 mg/dL (ref 8.4–10.5)
Chloride: 109 mEq/L (ref 96–112)
Creat: 0.96 mg/dL (ref 0.50–1.35)
GFR, Est African American: 82 mL/min
GFR, Est Non African American: 71 mL/min
Glucose, Bld: 112 mg/dL — ABNORMAL HIGH (ref 70–99)
Potassium: 4.6 mEq/L (ref 3.5–5.3)
Sodium: 143 mEq/L (ref 135–145)

## 2014-01-15 MED ORDER — METOPROLOL TARTRATE 25 MG PO TABS: 25.0000 mg | ORAL_TABLET | Freq: Two times a day (BID) | ORAL | Status: DC

## 2014-01-15 MED ORDER — BENAZEPRIL HCL 10 MG PO TABS: 10.0000 mg | ORAL_TABLET | Freq: Every day | ORAL | Status: DC

## 2014-01-15 MED ORDER — ATORVASTATIN CALCIUM 20 MG PO TABS: 20.0000 mg | ORAL_TABLET | Freq: Every day | ORAL | Status: DC

## 2014-01-15 NOTE — Patient Instructions (Signed)
General Instructions: STOP taking Lisinopril  Start taking Benazepril 10mg  daily  Follow up in 6-8 weeks  Please bring your medicines with you each time you come to clinic.  Medicines may include prescription medications, over-the-counter medications, herbal remedies, eye drops, vitamins, or other pills.   Progress Toward Treatment Goals:  Treatment Goal 01/15/2014  Blood pressure deteriorated  Prevent falls -    Self Care Goals & Plans:  Self Care Goal 01/15/2014  Manage my medications take my medicines as prescribed; bring my medications to every visit; refill my medications on time  Monitor my health -  Eat healthy foods eat more vegetables; eat foods that are low in salt; eat baked foods instead of fried foods  Be physically active -  Meeting treatment goals -    No flowsheet data found.   Care Management & Community Referrals:  Referral 09/10/2013  Referrals made for care management support none needed  Referrals made to community resources none

## 2014-01-15 NOTE — Progress Notes (Signed)
[name] Subjective:   Patient ID: [name] male   DOB: 04/25/1924 80 y.o.   MRN: [id number]  HPI: Jimmy.Infant D Deloatch is a 80 y.o. male with a PMH significant  For HTN, Cerebellar CVA, Mild anemia and thrombocytopenia, and BPH.  He underwent TURP on 05/19/13 with Dr. Gaynelle Arabian. After this he developed an extensive DVT of his left leg.  He subsequently had multiple complications with an additional DVT of his right leg diagnosed on 09/15/13, he was changed from Warfarin to Xarelto and has subsequently done much better.  More recently he was hospitalized 12/18/13-12/22/13 with CC of gait abnormality and dysarthria he was found to have an acute-on-chronic CVA of the left occipital pole (MRI) (symptoms don't match imaging).  It was felt that his gait abnormality was more likely chronic.  He subsequently was discharged to SNF where he has been until this past week when he was discharged.  His daughter and primary care giver [name] reports he is now doing much better.  He is ambulating around the house with a rolling walker.  She makes him his meals but his is eating on his own.  She assists him in dressing.  He appears to be very happy today and denies any complaints. His lower extremity swelling has greatly improved since I saw him last. He reports no difficulty urinating, no incontinence.  Of notes his medication list shows that his benazepril 5mg  daily has been changed to lisinopril 5mg .  [name] reports he has been taking the lisinopril 5mg .  Home health PT and OT have told her his BP has been elevated since discharge from SNF.  [name] would like [name] to be referred to podiatry for his disfigured toenails.  Past Medical History  Diagnosis Date  . Hypertension   . Difficulty hearing, right     BILATERAL HEARING LOSS - BEST TO TRY TO SPEAK INTO LEFT EAR  . Meningioma   . Frequent falls   . BPH (benign prostatic hypertrophy)     TURP 05/19/13  . Chronic  kidney disease     CHRONIC KIDNEY DISEASE, 2  . Anemia   . Thrombocytopenia   . Stroke     Cerebellar, 2013; WALKS WITH WALKER, ABLE TO DRESS AND BATHE HIMSELF BUT FAMILY TRIES TO PROVIDE SUPERVISION BECAUSE OF HIS HX OF FALL AND WEAKNESS LEGS, ARMS   . Incontinence of urine     SOME INCONTINENCE  . Hyperlipidemia   . DVT (deep venous thrombosis)    Current Outpatient Prescriptions  Medication Sig Dispense Refill  .  stroke: mapping our early stages of recovery book MISC 1 each by Does not apply route once. 1 each 0  . atorvastatin (LIPITOR) 20 MG tablet Take 20 mg by mouth daily at 6 PM.    . benazepril (LOTENSIN) 5 MG tablet Take 1 tablet (5 mg total) by mouth daily. 30 tablet 0  . Cholecalciferol (VITAMIN D PO) Take 1 tablet by mouth daily.    . Iron-Vitamins (GERITOL PO) Take 1 tablet by mouth daily.    . metoprolol tartrate (LOPRESSOR) 25 MG tablet Take 25 mg by mouth 2 (two) times daily.    Marland Kitchen omega-3 acid ethyl esters (LOVAZA) 1 G capsule Take 1 capsule (1 g total) by mouth daily. 30 capsule 3  . Rivaroxaban (XARELTO) 15 MG TABS tablet Take 1 tablet (15 mg total) by mouth daily with supper. 30 tablet 0   No current facility-administered medications for this visit.  Family History  Problem Relation Age of Onset  . Diabetes Mother   . Heart disease Mother   . Hypertension Mother   . Heart disease Father   . Hypertension Father   . Hypertension Daughter    History   Social History  . Marital Status: Widowed    Spouse Name: N/A    Number of Children: N/A  . Years of Education: N/A   Social History Main Topics  . Smoking status: Former Smoker    Types: Cigarettes  . Smokeless tobacco: None  . Alcohol Use: No     Comment: QUIT SMOKING 50 YRS AGO  . Drug Use: No  . Sexual Activity: No   Other Topics Concern  . None   Social History Narrative   Lives with daughter. Ambulates w walker. Can make basic meals (cereal). Very hard of hearing.    Review of  Systems: Review of Systems  Constitutional: Negative for fever and chills.  HENT: Negative for nosebleeds.   Eyes: Negative for blurred vision.  Respiratory: Negative for cough and shortness of breath.   Cardiovascular: Negative for chest pain and leg swelling.  Gastrointestinal: Negative for heartburn, abdominal pain, diarrhea, constipation, blood in stool and melena.  Neurological: Negative for dizziness, sensory change, speech change, weakness and headaches.    Objective:  Physical Exam: Filed Vitals:   01/15/14 1539  BP: 167/89  Pulse: 61  Temp: 98 F (36.7 C)  TempSrc: Oral  Height: 5\' 11"  (1.803 m)  Weight: 171 lb (77.565 kg)  SpO2: 99%  Physical Exam  Constitutional: He is well-developed, well-nourished, and in no distress.  elderaly male in wheelchair  HENT:  Head: Normocephalic and atraumatic.  HOH  Cardiovascular: Normal rate, regular rhythm and normal heart sounds.   Pulmonary/Chest: Effort normal and breath sounds normal.  Abdominal: Soft. Bowel sounds are normal.  Musculoskeletal: He exhibits edema (1+ edema bilaterally).  Skin:  Bilateral dry scaly skin on bilateral feet with no ulcers.  Onchomycosis of all toenails.  Nursing note and vitals reviewed.   Assessment & Plan:  Case discussed with Dr. Daryll Drown See Problem Based Assessment and Plan Medications Ordered Meds ordered this encounter  Medications  . benazepril (LOTENSIN) 10 MG tablet    Sig: Take 1 tablet (10 mg total) by mouth daily.    Dispense:  90 tablet    Refill:  3  . atorvastatin (LIPITOR) 20 MG tablet    Sig: Take 1 tablet (20 mg total) by mouth daily at 6 PM.    Dispense:  90 tablet    Refill:  3  . metoprolol tartrate (LOPRESSOR) 25 MG tablet    Sig: Take 1 tablet (25 mg total) by mouth 2 (two) times daily.    Dispense:  180 tablet    Refill:  3   Other Orders Orders Placed This Encounter  Procedures  . Flu Vaccine QUAD 36+ mos IM  . BMP with Estimated GFR (CPT-80048)  .  Ambulatory referral to Podiatry    Referral Priority:  Routine    Referral Type:  Consultation    Referral Reason:  Specialty Services Required    Requested Specialty:  Podiatry    Number of Visits Requested:  1

## 2014-01-18 NOTE — Assessment & Plan Note (Signed)
Will need to establish duration of treatment.  Will definitely have patient complete 6 months post last DVT diagnosed (03/18/13) although it may also be reasonable to continue treatment until risk outweighs benefit.  Will need to readdress at next visit and I would also like to discuss this case with Dr. Beryle Beams.  -Continue Xarelto until at least 03/18/14

## 2014-01-18 NOTE — Assessment & Plan Note (Signed)
Referral to podiatry  

## 2014-01-18 NOTE — Assessment & Plan Note (Signed)
Doing very well, will have patient continue HH PT and OT -Needs better BP control.

## 2014-01-18 NOTE — Assessment & Plan Note (Signed)
Doing very well, denies any gross hematuria or LUTS symptoms.

## 2014-01-18 NOTE — Assessment & Plan Note (Signed)
BP Readings from Last 3 Encounters:  01/15/14 167/89  01/09/14 126/66  12/24/13 160/96    Lab Results  Component Value Date   NA 143 01/15/2014   K 4.6 01/15/2014   CREATININE 0.96 01/15/2014    Assessment: Blood pressure control: moderately elevated Progress toward BP goal:  deteriorated Comments: on lisinopril 5mg  not benazepril  Plan: Medications: COntinue Metoprolol 25mg  BID (patient sufficiently BB), Change lisinopril back to benazepril and increase to 10mg  daily. Educational resources provided:   Self management tools provided:   Other plans: Check BMP now that ACEi is resumed.

## 2014-01-18 NOTE — Assessment & Plan Note (Signed)
-   Check BMP Renal function stable.

## 2014-01-18 NOTE — Assessment & Plan Note (Signed)
Will continue to monitor for symptoms.  Would not be a surgical candidate given his multiple complications after TURP.

## 2014-01-21 ENCOUNTER — Telehealth: Payer: Self-pay | Admitting: *Deleted

## 2014-01-21 NOTE — Telephone Encounter (Signed)
Don with [facility] OT [phone]  will be starting home OT in about 3 weeks. Will fax an outline of OT. Hilda Blades Keyasia Jolliff RN 01/21/14 4PM

## 2014-01-21 NOTE — Telephone Encounter (Signed)
OK, thank you.

## 2014-01-21 NOTE — Progress Notes (Signed)
Internal Medicine Clinic Attending  Case discussed with Dr. Heber [place] soon after the resident saw the patient.  We reviewed the resident's history and exam and pertinent patient test results.  I agree with the assessment, diagnosis, and plan of care documented in the resident's note.  Benazapril and lisinopril are dose equivalent, agree with change to antihypertensives.

## 2014-01-28 NOTE — Addendum Note (Signed)
Addended by: Yvonna Alanis E on: 01/28/2014 03:06 PM   Modules accepted: Orders

## 2014-03-26 ENCOUNTER — Encounter: Payer: Self-pay | Admitting: Internal Medicine

## 2014-03-26 ENCOUNTER — Ambulatory Visit (INDEPENDENT_AMBULATORY_CARE_PROVIDER_SITE_OTHER): Payer: Medicare Other | Admitting: Internal Medicine

## 2014-03-26 VITALS — BP 164/84 | HR 55 | Temp 98.0°F | Ht 71.0 in | Wt 169.2 lb

## 2014-03-26 DIAGNOSIS — I10 Essential (primary) hypertension: Secondary | ICD-10-CM

## 2014-03-26 DIAGNOSIS — N183 Chronic kidney disease, stage 3 unspecified: Secondary | ICD-10-CM

## 2014-03-26 DIAGNOSIS — Z7901 Long term (current) use of anticoagulants: Secondary | ICD-10-CM

## 2014-03-26 DIAGNOSIS — R5381 Other malaise: Secondary | ICD-10-CM

## 2014-03-26 DIAGNOSIS — H4312 Vitreous hemorrhage, left eye: Secondary | ICD-10-CM

## 2014-03-26 DIAGNOSIS — I82509 Chronic embolism and thrombosis of unspecified deep veins of unspecified lower extremity: Secondary | ICD-10-CM

## 2014-03-26 MED ORDER — RIVAROXABAN 15 MG PO TABS: 15.0000 mg | ORAL_TABLET | Freq: Every day | ORAL | Status: DC

## 2014-03-26 NOTE — Assessment & Plan Note (Signed)
-   Discussion with patient and daughter want to continue Xarelto.   - Patient still has filter in place, no longer has indication will place IR consult for removal.

## 2014-03-26 NOTE — Assessment & Plan Note (Signed)
-  Xarelto 15 mg daily adjusted for renal clearance.

## 2014-03-26 NOTE — Assessment & Plan Note (Signed)
BP Readings from Last 3 Encounters:  03/26/14 164/84  01/15/14 167/89  01/09/14 126/66    Lab Results  Component Value Date   NA 143 01/15/2014   K 4.6 01/15/2014   CREATININE 0.96 01/15/2014    Assessment: Blood pressure control: mildly elevated Progress toward BP goal:  unchanged Comments: remains mildly above goal <150/90, history of orthostatic hypotension with further dose adjustment  Plan: Medications:  Benazepril 10mg  daily, Metoprolol 25mg  BID Educational resources provided: brochure (denied) Self management tools provided: home blood pressure logbook Other plans: hesitant to increase BP meds given how well he is doing.  I have given daughter BP logs and she will monitor his home blood pressure.

## 2014-03-26 NOTE — Progress Notes (Signed)
[facility] Subjective:   Patient ID: [name] male   DOB: 12-Nov-1924 81 y.o.   MRN: [id number]  HPI: [name] is a 81 y.o. male with a PMH significant  For HTN, Cerebellar CVA, Mild anemia and thrombocytopenia, and BPH.  HPI 01/15/14 He underwent TURP on 05/19/13 with Dr. Gaynelle Arroyo. After this he developed an extensive DVT of his left leg.  He subsequently had multiple complications with an additional DVT of his right leg diagnosed on 09/15/13, he was changed from Warfarin to Xarelto and has subsequently done much better.  He was hospitalized 12/18/13-12/22/13 with CC of gait abnormality and dysarthria he was found to have an acute-on-chronic CVA of the left occipital pole (MRI) (symptoms don't match imaging).  It was felt that his gait abnormality was more likely chronic.  He subsequently was discharged to SNF where he has been until this past week when he was discharged.     03/26/14 His daughter and primary care giver [name] accompanies him.  She reports overall he has done pretty well. He is taking his medication as instructed and has finished home health PT, she has used the exercises they showed her and she continues them with him.  She is concerned that he seems to be a little weaker on the right side and that she is concerned he may fall to that side but does well when he is using the walker. She also notes that he has had some urinary incontenance in that she will change his diaper and then it may be wet again within 30 minutes.  She has plans to go back to Dr. Gaynelle Arroyo to discuss this.  He has no dysuria.    Past Medical History  Diagnosis Date  . Hypertension   . Difficulty hearing, right     BILATERAL HEARING LOSS - BEST TO TRY TO SPEAK INTO LEFT EAR  . Meningioma   . Frequent falls   . BPH (benign prostatic hypertrophy)     TURP 05/19/13  . Chronic kidney disease     CHRONIC KIDNEY DISEASE, 2  . Anemia   . Thrombocytopenia   . Stroke      Cerebellar, 2013; WALKS WITH WALKER, ABLE TO DRESS AND BATHE HIMSELF BUT FAMILY TRIES TO PROVIDE SUPERVISION BECAUSE OF HIS HX OF FALL AND WEAKNESS LEGS, ARMS   . Incontinence of urine     SOME INCONTINENCE  . Hyperlipidemia   . DVT (deep venous thrombosis)    Current Outpatient Prescriptions  Medication Sig Dispense Refill  . atorvastatin (LIPITOR) 20 MG tablet Take 1 tablet (20 mg total) by mouth daily at 6 PM. 90 tablet 3  . benazepril (LOTENSIN) 10 MG tablet Take 1 tablet (10 mg total) by mouth daily. 90 tablet 3  . metoprolol tartrate (LOPRESSOR) 25 MG tablet Take 1 tablet (25 mg total) by mouth 2 (two) times daily. 180 tablet 3  . omega-3 acid ethyl esters (LOVAZA) 1 G capsule Take 1 capsule (1 g total) by mouth daily. 30 capsule 3  . Rivaroxaban (XARELTO) 15 MG TABS tablet Take 1 tablet (15 mg total) by mouth daily with supper. 30 tablet 0  . Cholecalciferol (VITAMIN D PO) Take 1 tablet by mouth daily.    . Iron-Vitamins (GERITOL PO) Take 1 tablet by mouth daily.     No current facility-administered medications for this visit.   Family History  Problem Relation Age of Onset  . Diabetes Mother   . Heart disease  Mother   . Hypertension Mother   . Heart disease Father   . Hypertension Father   . Hypertension Daughter    History   Social History  . Marital Status: Widowed    Spouse Name: N/A    Number of Children: N/A  . Years of Education: N/A   Social History Main Topics  . Smoking status: Former Smoker    Types: Cigarettes  . Smokeless tobacco: None  . Alcohol Use: No     Comment: QUIT SMOKING 50 YRS AGO  . Drug Use: No  . Sexual Activity: No   Other Topics Concern  . None   Social History Narrative   Lives with daughter. Ambulates w walker. Can make basic meals (cereal). Very hard of hearing.    Review of Systems: Review of Systems  Constitutional: Negative for fever and chills.  Eyes: Negative for blurred vision.  Respiratory: Negative for shortness  of breath.   Cardiovascular: Negative for chest pain and leg swelling.  Gastrointestinal: Negative for heartburn, abdominal pain, diarrhea, blood in stool and melena.  Genitourinary: Negative for dysuria and hematuria.  Skin: Negative for rash.  Neurological: Negative for tingling, sensory change, speech change and headaches.    Objective:  Physical Exam: Filed Vitals:   03/26/14 1410  BP: 164/84  Pulse: 55  Temp: 98 F (36.7 C)  TempSrc: Oral  Height: 5\' 11"  (1.803 m)  Weight: 169 lb 3.2 oz (76.749 kg)  SpO2: 97%  Physical Exam  Constitutional: He is well-developed, well-nourished, and in no distress.  elderaly male in wheelchair  HENT:  Head: Normocephalic and atraumatic.  HOH  Cardiovascular: Normal rate, regular rhythm and normal heart sounds.   Pulmonary/Chest: Effort normal and breath sounds normal.  Abdominal: Soft. Bowel sounds are normal.  Musculoskeletal: He exhibits edema (trace).  Neurological: No cranial nerve deficit.  4+-5/5 muscle strength of bilateral hips, knees, and ankles, 5/5 grip strength bilaterally.  Skin:  Bilateral dry scaly skin on bilateral feet with no ulcers.  Onchomycosis of all toenails.  Nursing note and vitals reviewed.   Assessment & Plan:  Case discussed with Dr. Dareen Piano  Chronic anticoagulation - Will remain on dose adjusted Xarelto, provided 6 month supply today.   Physical deconditioning -Daughter will continue to work with patient with exercises that were taught to her by Physical therapy.   CKD (chronic kidney disease) stage 3, GFR 30-59 ml/min -Xarelto 15 mg daily adjusted for renal clearance.   HTN (hypertension) BP Readings from Last 3 Encounters:  03/26/14 164/84  01/15/14 167/89  01/09/14 126/66    Lab Results  Component Value Date   NA 143 01/15/2014   K 4.6 01/15/2014   CREATININE 0.96 01/15/2014    Assessment: Blood pressure control: mildly elevated Progress toward BP goal:  unchanged Comments:  remains mildly above goal <150/90, history of orthostatic hypotension with further dose adjustment  Plan: Medications:  Benazepril 10mg  daily, Metoprolol 25mg  BID Educational resources provided: brochure (denied) Self management tools provided: home blood pressure logbook Other plans: hesitant to increase BP meds given how well he is doing.  I have given daughter BP logs and she will monitor his home blood pressure.    Leg DVT (deep venous thromboembolism), chronic - Discussion with patient and daughter want to continue Xarelto.   - Patient still has filter in place, no longer has indication will place IR consult for removal.     Medications Ordered Meds ordered this encounter  Medications  . Rivaroxaban (XARELTO) 15  MG TABS tablet    Sig: Take 1 tablet (15 mg total) by mouth daily with supper.    Dispense:  90 tablet    Refill:  1   Other Orders Orders Placed This Encounter  Procedures  . IR Radiologist Eval & Mgmt    No longer has indication for IVC filter.    Standing Status: Future     Number of Occurrences:      Standing Expiration Date: 05/25/2015    Order Specific Question:  Reason for Exam (SYMPTOM  OR DIAGNOSIS REQUIRED)    Answer:  removal of IVC filter    Order Specific Question:  Preferred Imaging Location?    Answer:  [facility]

## 2014-03-26 NOTE — Assessment & Plan Note (Signed)
-  Daughter will continue to work with patient with exercises that were taught to her by Physical therapy.

## 2014-03-26 NOTE — Assessment & Plan Note (Signed)
-   Will remain on dose adjusted Xarelto, provided 6 month supply today.

## 2014-03-26 NOTE — Patient Instructions (Addendum)
General Instructions: Please continue medications as instructed.  We will contact you with the appointment with the radiologist for removal of the filter.  Thank you for bringing your medicines today. This helps [place] keep you safe from mistakes.   Progress Toward Treatment Goals:  Treatment Goal 03/26/2014  Blood pressure unchanged  Prevent falls -    Self Care Goals & Plans:  Self Care Goal 03/26/2014  Manage my medications take my medicines as prescribed; bring my medications to every visit; refill my medications on time  Monitor my health -  Eat healthy foods drink diet soda or water instead of juice or soda; eat more vegetables; eat foods that are low in salt; eat baked foods instead of fried foods; eat fruit for snacks and desserts  Be physically active -  Meeting treatment goals -    No flowsheet data found.   Care Management & Community Referrals:  Referral 03/26/2014  Referrals made for care management support none needed  Referrals made to community resources -

## 2014-03-27 DIAGNOSIS — N189 Chronic kidney disease, unspecified: Secondary | ICD-10-CM | POA: Diagnosis not present

## 2014-03-27 DIAGNOSIS — I6789 Other cerebrovascular disease: Secondary | ICD-10-CM | POA: Diagnosis not present

## 2014-03-27 NOTE — Progress Notes (Signed)
INTERNAL MEDICINE TEACHING ATTENDING ADDENDUM - Aldine Contes, MD: I reviewed and discussed at the time of visit with the resident Dr, [place], the patient's medical history, physical examination, diagnosis and results of pertinent tests and treatment and I agree with the patient's care as documented.

## 2014-04-01 ENCOUNTER — Encounter: Payer: Self-pay | Admitting: Internal Medicine

## 2014-04-09 DIAGNOSIS — I6789 Other cerebrovascular disease: Secondary | ICD-10-CM | POA: Diagnosis not present

## 2014-04-27 DIAGNOSIS — I6789 Other cerebrovascular disease: Secondary | ICD-10-CM | POA: Diagnosis not present

## 2014-04-27 DIAGNOSIS — N189 Chronic kidney disease, unspecified: Secondary | ICD-10-CM | POA: Diagnosis not present

## 2014-05-10 DIAGNOSIS — I6789 Other cerebrovascular disease: Secondary | ICD-10-CM | POA: Diagnosis not present

## 2014-05-12 ENCOUNTER — Other Ambulatory Visit: Payer: Self-pay | Admitting: *Deleted

## 2014-05-12 MED ORDER — OMEGA-3-ACID ETHYL ESTERS 1 G PO CAPS: 1.0000 g | ORAL_CAPSULE | Freq: Every day | ORAL | Status: DC

## 2014-05-25 ENCOUNTER — Telehealth: Payer: Self-pay | Admitting: *Deleted

## 2014-05-25 ENCOUNTER — Encounter: Payer: Self-pay | Admitting: Internal Medicine

## 2014-05-25 ENCOUNTER — Encounter: Payer: Medicare Other | Admitting: Internal Medicine

## 2014-05-25 ENCOUNTER — Ambulatory Visit (HOSPITAL_COMMUNITY)
Admission: RE | Admit: 2014-05-25 | Discharge: 2014-05-25 | Disposition: A | Discharge status: Home / Self Care | Payer: Medicare Other | Source: Ambulatory Visit | Attending: Internal Medicine | Admitting: Internal Medicine

## 2014-05-25 ENCOUNTER — Ambulatory Visit (INDEPENDENT_AMBULATORY_CARE_PROVIDER_SITE_OTHER): Payer: Medicare Other | Admitting: Internal Medicine

## 2014-05-25 VITALS — BP 174/100 | HR 56 | Temp 97.6°F | Ht 71.0 in | Wt 169.4 lb

## 2014-05-25 DIAGNOSIS — Z0000 Encounter for general adult medical examination without abnormal findings: Secondary | ICD-10-CM | POA: Diagnosis not present

## 2014-05-25 DIAGNOSIS — M11261 Other chondrocalcinosis, right knee: Secondary | ICD-10-CM | POA: Diagnosis not present

## 2014-05-25 DIAGNOSIS — I82509 Chronic embolism and thrombosis of unspecified deep veins of unspecified lower extremity: Secondary | ICD-10-CM | POA: Diagnosis not present

## 2014-05-25 DIAGNOSIS — M25461 Effusion, right knee: Secondary | ICD-10-CM | POA: Insufficient documentation

## 2014-05-25 DIAGNOSIS — I10 Essential (primary) hypertension: Secondary | ICD-10-CM

## 2014-05-25 DIAGNOSIS — M25561 Pain in right knee: Secondary | ICD-10-CM | POA: Diagnosis present

## 2014-05-25 DIAGNOSIS — M1711 Unilateral primary osteoarthritis, right knee: Secondary | ICD-10-CM | POA: Diagnosis not present

## 2014-05-25 MED ORDER — NAPROXEN 500 MG PO TABS: 500.0000 mg | ORAL_TABLET | Freq: Two times a day (BID) | ORAL | Status: DC

## 2014-05-25 NOTE — Assessment & Plan Note (Addendum)
Daughter states that pt began complaining of right knee pain last Monday when she was getting him into bed.  She also noticed some swelling around that time of the knee which comes and goes.  He is ambulatory with a walker and has not had any decrease in activity lately.  She is concerned about a possible LE DVT since he has a h/o this.  However, on exam, there is no calf tenderness, swelling, erythema, warmth, or palpable cord.  There is right knee swelling without erythema, warmth, or crepitus.  He has good ROM b/l in the knees.  No previous XR of the right knee.  Denies any injury or falls that would account for pain.  No h/o gout.  Denies fever/chills.  Likely OA.  -STAT XR of R knee -will try short course of naproxen (SCr OK)  -f/u in 1 week   ADDENDUM: XR of R knee shows likely OA

## 2014-05-25 NOTE — Telephone Encounter (Signed)
Agree with plan 

## 2014-05-25 NOTE — Assessment & Plan Note (Signed)
-  needs prevnar

## 2014-05-25 NOTE — Patient Instructions (Signed)
Thank you for your visit today.   Please return to the internal medicine clinic on Friday or sooner if needed.     Your current medical regimen is effective;  continue present plan and take all medications as prescribed.    I have given you naproxen 500mg  to take with food every 12 hours for 3 days.  Please come back on Friday to see if this has improved your pain.   Please be sure to bring all of your medications with you to every visit; this includes herbal supplements, vitamins, eye drops, and any over-the-counter medications.   Should you have any questions regarding your medications and/or any new or worsening symptoms, please be sure to call the clinic at [phone].   If you believe that you are suffering from a life threatening condition or one that may result in the loss of limb or function, then you should call 911 or proceed to the nearest Emergency Department.    Osteoarthritis Osteoarthritis is a disease that causes soreness and inflammation of a joint. It occurs when the cartilage at the affected joint wears down. Cartilage acts as a cushion, covering the ends of bones where they meet to form a joint. Osteoarthritis is the most common form of arthritis. It often occurs in older people. The joints affected most often by this condition include those in the:  Ends of the fingers.  Thumbs.  Neck.  Lower back.  Knees.  Hips. CAUSES  Over time, the cartilage that covers the ends of bones begins to wear away. This causes bone to rub on bone, producing pain and stiffness in the affected joints.  RISK FACTORS Certain factors can increase your chances of having osteoarthritis, including:  Older age.  Excessive body weight.  Overuse of joints.  Previous joint injury. SIGNS AND SYMPTOMS   Pain, swelling, and stiffness in the joint.  Over time, the joint may lose its normal shape.  Small deposits of bone (osteophytes) may grow on the edges of the joint.  Bits of  bone or cartilage can break off and float inside the joint space. This may cause more pain and damage. DIAGNOSIS  Your health care provider will do a physical exam and ask about your symptoms. Various tests may be ordered, such as:  X-rays of the affected joint.  An MRI scan.  Blood tests to rule out other types of arthritis.  Joint fluid tests. This involves using a needle to draw fluid from the joint and examining the fluid under a microscope. TREATMENT  Goals of treatment are to control pain and improve joint function. Treatment plans may include:  A prescribed exercise program that allows for rest and joint relief.  A weight control plan.  Pain relief techniques, such as:  Properly applied heat and cold.  Electric pulses delivered to nerve endings under the skin (transcutaneous electrical nerve stimulation [TENS]).  Massage.  Certain nutritional supplements.  Medicines to control pain, such as:  Acetaminophen.  Nonsteroidal anti-inflammatory drugs (NSAIDs), such as naproxen.  Narcotic or central-acting agents, such as tramadol.  Corticosteroids. These can be given orally or as an injection.  Surgery to reposition the bones and relieve pain (osteotomy) or to remove loose pieces of bone and cartilage. Joint replacement may be needed in advanced states of osteoarthritis. HOME CARE INSTRUCTIONS   Take medicines only as directed by your health care provider.  Maintain a healthy weight. Follow your health care provider's instructions for weight control. This may include dietary instructions.  Exercise as directed. Your health care provider can recommend specific types of exercise. These may include:  Strengthening exercises. These are done to strengthen the muscles that support joints affected by arthritis. They can be performed with weights or with exercise bands to add resistance.  Aerobic activities. These are exercises, such as brisk walking or low-impact aerobics,  that get your heart pumping.  Range-of-motion activities. These keep your joints limber.  Balance and agility exercises. These help you maintain daily living skills.  Rest your affected joints as directed by your health care provider.  Keep all follow-up visits as directed by your health care provider. SEEK MEDICAL CARE IF:   Your skin turns red.  You develop a rash in addition to your joint pain.  You have worsening joint pain.  You have a fever along with joint or muscle aches. SEEK IMMEDIATE MEDICAL CARE IF:  You have a significant loss of weight or appetite.  You have night sweats. [place] of Arthritis and Musculoskeletal and Skin Diseases: www.niams.SouthExposed.es  [employer] on Aging: http://kim-miller.com/  American College of Rheumatology: www.rheumatology.org Document Released: 02/27/2005 Document Revised: 07/14/2013 Document Reviewed: 11/04/2012 [facility] Patient Information 2015 [place], [place]. This information is not intended to replace advice given to you by your health care provider. Make sure you discuss any questions you have with your health care provider.

## 2014-05-25 NOTE — Telephone Encounter (Signed)
Daughter calls and states pt has been c/o pain behind his knee and it is swollen, they are worried that pt may have a dvt again, appt dr Ronnald Ramp at [phone]

## 2014-05-25 NOTE — Progress Notes (Signed)
Internal Medicine Clinic Attending  I saw and evaluated the patient.  I personally confirmed the key portions of the history and exam documented by Dr. Gordy Levan and I reviewed pertinent patient test results.  The assessment, diagnosis, and plan were formulated together and I agree with the documentation in the resident's note.  Patient presents with right knee pain when bearing weight for about one week; exam shows apparent soft tissue swelling of the knee, with no warmth or erythema, and no pain on passive range of motion; the patella is not ballotable and there does not appear to be an appreciable knee effusion on exam.  There is mild focal tenderness on the medial aspect of the patella.  There is no calf tenderness, leg edema, or other finding to suggest DVT.  Agree with plans as outlined by Dr. Gordy Levan.

## 2014-05-25 NOTE — Assessment & Plan Note (Addendum)
Right knee pain and swelling without crepitus, erythema, or warmth.  Left knee unremarkable.  No calf tenderness, erythema, warmth, or palpable cord.  Daughter reports missing one dose of xarelto last week.  Cautioned her that he needs to not miss any doses to be protected. -no s/s of LE Right DVT  -advised to continue xarelto and to not miss any doses

## 2014-05-25 NOTE — Assessment & Plan Note (Signed)
BP mildly elevated today possible d/t pain.   -continue current meds and follow-up in 1 week

## 2014-05-25 NOTE — Progress Notes (Signed)
Patient ID: [name], male   DOB: [birth year], 81 y.o.  MRN: [id number]      Subjective:   Patient ID: [name] male    DOB: 10-Oct-1924 81 y.o.    MRN: [id number] Health Maintenance Due: Health Maintenance Due  Topic Date Due  . TETANUS/TDAP  11/21/1945  . COLONOSCOPY  11/21/1976  . ZOSTAVAX  11/22/1986  . PNA vac Low Risk Adult (2 of 2 - PCV13) 02/03/2014    _________________________________________________  HPI: [name] is a 81 y.o. male here for an acute visit.  Pt has a PMH outlined below.  Please see problem-based charting assessment and plan note for further details of medical issues addressed at today's visit.  PMH: Past Medical History  Diagnosis Date  . Hypertension   . Difficulty hearing, right     BILATERAL HEARING LOSS - BEST TO TRY TO SPEAK INTO LEFT EAR  . Meningioma   . Frequent falls   . BPH (benign prostatic hypertrophy)     TURP 05/19/13  . Chronic kidney disease     CHRONIC KIDNEY DISEASE, 2  . Anemia   . Thrombocytopenia   . Stroke     Cerebellar, 2013; WALKS WITH WALKER, ABLE TO DRESS AND BATHE HIMSELF BUT FAMILY TRIES TO PROVIDE SUPERVISION BECAUSE OF HIS HX OF FALL AND WEAKNESS LEGS, ARMS   . Incontinence of urine     SOME INCONTINENCE  . Hyperlipidemia   . DVT (deep venous thrombosis)     Medications: Current Outpatient Prescriptions on File Prior to Visit  Medication Sig Dispense Refill  . atorvastatin (LIPITOR) 20 MG tablet Take 1 tablet (20 mg total) by mouth daily at 6 PM. 90 tablet 3  . benazepril (LOTENSIN) 10 MG tablet Take 1 tablet (10 mg total) by mouth daily. 90 tablet 3  . Cholecalciferol (VITAMIN D PO) Take 1 tablet by mouth daily.    . Iron-Vitamins (GERITOL PO) Take 1 tablet by mouth daily.    . metoprolol tartrate (LOPRESSOR) 25 MG tablet Take 1 tablet (25 mg total) by mouth 2 (two) times daily. 180 tablet 3  . omega-3 acid ethyl esters (LOVAZA) 1 G capsule Take 1 capsule (1 g total) by mouth daily. 30  capsule 3  . Rivaroxaban (XARELTO) 15 MG TABS tablet Take 1 tablet (15 mg total) by mouth daily with supper. 90 tablet 1   No current facility-administered medications on file prior to visit.    Allergies: No Known Allergies  FH: Family History  Problem Relation Age of Onset  . Diabetes Mother   . Heart disease Mother   . Hypertension Mother   . Heart disease Father   . Hypertension Father   . Hypertension Daughter     SH: History   Social History  . Marital Status: Widowed    Spouse Name: N/A  . Number of Children: N/A  . Years of Education: N/A   Social History Main Topics  . Smoking status: Former Smoker    Types: Cigarettes  . Smokeless tobacco: Not on file  . Alcohol Use: No     Comment: QUIT SMOKING 50 YRS AGO  . Drug Use: No  . Sexual Activity: No   Other Topics Concern  . None   Social History Narrative   Lives with daughter. Ambulates w walker. Can make basic meals (cereal). Very hard of hearing.     Review of Systems: Constitutional: Negative for fever, chills and weight loss.  Eyes: Negative for blurred  vision.  Respiratory: +mild cough and no shortness of breath.  Cardiovascular: Negative for chest pain, palpitations and leg swelling.  Gastrointestinal: Negative for nausea, vomiting, abdominal pain, diarrhea, constipation and blood in stool.  Genitourinary: Negative for dysuria, urgency and frequency.  Musculoskeletal: Negative for myalgias and back pain.  Neurological: Negative for dizziness, weakness and headaches.     Objective:   Vital Signs: Filed Vitals:   05/25/14 1553  BP: 174/100  Pulse: 56  Temp: 97.6 F (36.4 C)  TempSrc: Oral  Height: 5\' 11"  (1.803 m)  Weight: 169 lb 6.4 oz (76.839 kg)  SpO2: 99%     BP Readings from Last 3 Encounters:  05/25/14 174/100  03/26/14 164/84  01/15/14 167/89    Physical Exam: Constitutional: Vital signs reviewed.  Patient is in NAD and cooperative with exam.  Head: Normocephalic and  atraumatic. Eyes: PERRL, EOMI, conjunctivae nl, no scleral icterus.  Neck: Supple. Cardiovascular: RRR, no MRG. Pulmonary/Chest: normal effort, CTAB, no wheezes, rales, or rhonchi. Abdominal: Soft. NT/ND +BS. Musculoskeletal: Full ROM of right and left knee without pain.  R knee edematous without crepitus, erythema, or warmth.  No palpable cord b/l.   Neurological: A&O x3, cranial nerves II-XII are grossly intact, moving all extremities. Extremities: 2+DP b/l; no pitting edema. Skin: Warm, dry and intact. Multiple SKs.   Assessment & Plan:   Assessment and plan was discussed and formulated with my attending.

## 2014-05-26 DIAGNOSIS — N189 Chronic kidney disease, unspecified: Secondary | ICD-10-CM | POA: Diagnosis not present

## 2014-05-26 DIAGNOSIS — I6789 Other cerebrovascular disease: Secondary | ICD-10-CM | POA: Diagnosis not present

## 2014-06-01 ENCOUNTER — Ambulatory Visit: Payer: Medicare Other | Admitting: Internal Medicine

## 2014-06-11 ENCOUNTER — Encounter: Payer: Self-pay | Admitting: Internal Medicine

## 2014-06-11 ENCOUNTER — Ambulatory Visit (INDEPENDENT_AMBULATORY_CARE_PROVIDER_SITE_OTHER): Payer: Medicare Other | Admitting: Internal Medicine

## 2014-06-11 VITALS — BP 188/95 | HR 50 | Temp 97.8°F | Wt 166.6 lb

## 2014-06-11 DIAGNOSIS — M25561 Pain in right knee: Secondary | ICD-10-CM

## 2014-06-11 DIAGNOSIS — I10 Essential (primary) hypertension: Secondary | ICD-10-CM | POA: Diagnosis not present

## 2014-06-11 MED ORDER — BENAZEPRIL HCL 20 MG PO TABS: 20.0000 mg | ORAL_TABLET | Freq: Every day | ORAL | Status: DC

## 2014-06-11 NOTE — Assessment & Plan Note (Signed)
BP Readings from Last 3 Encounters:  06/11/14 188/95  05/25/14 174/100  03/26/14 164/84    Lab Results  Component Value Date   NA 143 01/15/2014   K 4.6 01/15/2014   CREATININE 0.96 01/15/2014    Assessment: Blood pressure control:  uncontrolled Progress toward BP goal:   deteriorated Comments: He has taken metoprolol today but takes the ACEI at night (last dose yesterday at [facility]).  Repeat BP still elevated.  Plan: Medications:  continue current medications:  Metoprolol 25mg  BID, INCREASE benazepril from 10mg  daily to 20mg  daily. Self management tools provided:  BP log book  Other plans: His daughter says they are able to check his BP at home.  Plan to check BP, keep log and bring to next visit.  He may require further titration.  Last BMP stable. Check BMP and BP in 2 weeks.

## 2014-06-11 NOTE — Patient Instructions (Signed)
1. I am increasing your blood pressure medication from benazepril 10mg  to 20mg  daily.  Please pick up the new prescription from your pharmacy.  OR you can take two of your 10mg  tablets until you run out and then pick up the 20mg  tablets.   2. Please take all medications as prescribed.    3. If you have worsening of your symptoms or new symptoms arise, please call the clinic ([phone]), or go to the ER immediately if symptoms are severe.

## 2014-06-11 NOTE — Assessment & Plan Note (Addendum)
Daughter and patient agree that swelling and pain have resolved.  The patient is able to ambulate with walker as usual.  No change in behavior or appetite.  No fever or chills.  He has finished the short course of naproxen and does not feel the need for more pain medication.  Xray 2 weeks ago c/w OA but also with features of possible CPPD.   - return to clinic if swelling or pain returns - will need aspiration for analysis if this recurs

## 2014-06-11 NOTE — Progress Notes (Signed)
Case discussed with Dr. Wilson at time of visit. We reviewed the resident's history and exam and pertinent patient test results. I agree with the assessment, diagnosis, and plan of care documented in the resident's note. 

## 2014-06-11 NOTE — Progress Notes (Signed)
   Subjective:    Patient ID: [name], male    DOB: 07-22-1924, 81 y.o.   MRN: [id number]  HPI Comments: [name] is an 81 year old man with a PMH as below here for follow-up right knee pain and swelling.  Please see problem based charting for details.    Past Medical History  Diagnosis Date  . Hypertension   . Difficulty hearing, right     BILATERAL HEARING LOSS - BEST TO TRY TO SPEAK INTO LEFT EAR  . Meningioma   . Frequent falls   . BPH (benign prostatic hypertrophy)     TURP 05/19/13  . Chronic kidney disease     CHRONIC KIDNEY DISEASE, 2  . Anemia   . Thrombocytopenia   . Stroke     Cerebellar, 2013; WALKS WITH WALKER, ABLE TO DRESS AND BATHE HIMSELF BUT FAMILY TRIES TO PROVIDE SUPERVISION BECAUSE OF HIS HX OF FALL AND WEAKNESS LEGS, ARMS   . Incontinence of urine     SOME INCONTINENCE  . Hyperlipidemia   . DVT (deep venous thrombosis)     Review of Systems  Constitutional: Negative for fever, chills and appetite change.  Eyes: Negative for visual disturbance.  Respiratory: Negative for shortness of breath.   Cardiovascular: Negative for chest pain and leg swelling.  Gastrointestinal: Negative for nausea, vomiting, abdominal pain, diarrhea and blood in stool.  Genitourinary: Negative for dysuria and difficulty urinating.  Neurological: Negative for dizziness, light-headedness and headaches.       Filed Vitals:   06/11/14 1116 06/11/14 1216  BP: 187/89 188/95  Pulse: 50 50  Temp: 97.8 F (36.6 C)   TempSrc: Oral   Weight: 166 lb 9.6 oz (75.569 kg)   SpO2: 100%    Objective:   Physical Exam  Constitutional: He is oriented to person, place, and time. He appears well-developed. No distress.  HENT:  Head: Normocephalic and atraumatic.  Mouth/Throat: No oropharyngeal exudate.  Eyes: EOM are normal. Pupils are equal, round, and reactive to light.  Neck: Neck supple.  Cardiovascular: Regular rhythm and normal heart sounds.  Exam reveals no gallop and no  friction rub.   No murmur heard. Manual pulse 56.  Pulmonary/Chest: Effort normal and breath sounds normal. No respiratory distress. He has no wheezes. He has no rales.  Abdominal: Soft. Bowel sounds are normal. He exhibits no distension and no mass. There is no tenderness. There is no rebound and no guarding.  Musculoskeletal: Normal range of motion. He exhibits no edema or tenderness.  There is no knee effusion; knee with normal ROM, no TTP, no pain with varus or valgus stress.  Neurological: He is alert and oriented to person, place, and time. No cranial nerve deficit.  Lower extremity strength is equal.  Skin: Skin is warm. He is not diaphoretic.  Psychiatric: He has a normal mood and affect. His behavior is normal.  Vitals reviewed.         Assessment & Plan:  Please see problem based assessment and plan.

## 2014-06-16 ENCOUNTER — Telehealth: Payer: Self-pay | Admitting: Licensed Clinical Social Worker

## 2014-06-16 NOTE — Telephone Encounter (Signed)
CSW received PCS request form on behalf of [name].  Chart reviewed and PCS request was completed in Nov 2015, however, daughter states she did not receive call from [facility] to schedule assessment.  CSW informed dau, will confirm payor source and forward PCS request to PCP.  Daughter states [name] church member is assisting with linking patient with services and informed pt about program.  Daughter states Nun/worker at [place] requested copy.  CSW informed daughter, once form is forwarded to PCP and faxed; copy will be mailed to patient for confirmation.  Encouraged daughter to be mindful of telephone call from [facility] to schedule assessment.

## 2014-06-18 ENCOUNTER — Encounter: Payer: Self-pay | Admitting: Licensed Clinical Social Worker

## 2014-07-02 ENCOUNTER — Encounter: Payer: Medicare Other | Admitting: Internal Medicine

## 2014-07-09 ENCOUNTER — Emergency Department (HOSPITAL_COMMUNITY): Payer: Medicare Other

## 2014-07-09 ENCOUNTER — Emergency Department (HOSPITAL_COMMUNITY)
Admission: EM | Admit: 2014-07-09 | Discharge: 2014-07-09 | Disposition: A | Discharge status: Home / Self Care | Payer: Medicare Other | Attending: Emergency Medicine | Admitting: Emergency Medicine

## 2014-07-09 ENCOUNTER — Encounter (HOSPITAL_COMMUNITY): Payer: Self-pay | Admitting: Emergency Medicine

## 2014-07-09 DIAGNOSIS — Z86018 Personal history of other benign neoplasm: Secondary | ICD-10-CM | POA: Insufficient documentation

## 2014-07-09 DIAGNOSIS — D649 Anemia, unspecified: Secondary | ICD-10-CM | POA: Insufficient documentation

## 2014-07-09 DIAGNOSIS — Z8673 Personal history of transient ischemic attack (TIA), and cerebral infarction without residual deficits: Secondary | ICD-10-CM | POA: Diagnosis not present

## 2014-07-09 DIAGNOSIS — R509 Fever, unspecified: Secondary | ICD-10-CM | POA: Diagnosis not present

## 2014-07-09 DIAGNOSIS — Z86718 Personal history of other venous thrombosis and embolism: Secondary | ICD-10-CM | POA: Insufficient documentation

## 2014-07-09 DIAGNOSIS — Z87891 Personal history of nicotine dependence: Secondary | ICD-10-CM | POA: Insufficient documentation

## 2014-07-09 DIAGNOSIS — Z79899 Other long term (current) drug therapy: Secondary | ICD-10-CM | POA: Insufficient documentation

## 2014-07-09 DIAGNOSIS — N390 Urinary tract infection, site not specified: Secondary | ICD-10-CM | POA: Diagnosis not present

## 2014-07-09 DIAGNOSIS — R531 Weakness: Secondary | ICD-10-CM | POA: Diagnosis present

## 2014-07-09 DIAGNOSIS — H9191 Unspecified hearing loss, right ear: Secondary | ICD-10-CM | POA: Insufficient documentation

## 2014-07-09 DIAGNOSIS — Z7901 Long term (current) use of anticoagulants: Secondary | ICD-10-CM | POA: Diagnosis not present

## 2014-07-09 DIAGNOSIS — N182 Chronic kidney disease, stage 2 (mild): Secondary | ICD-10-CM | POA: Diagnosis not present

## 2014-07-09 DIAGNOSIS — I129 Hypertensive chronic kidney disease with stage 1 through stage 4 chronic kidney disease, or unspecified chronic kidney disease: Secondary | ICD-10-CM | POA: Diagnosis not present

## 2014-07-09 DIAGNOSIS — M6281 Muscle weakness (generalized): Secondary | ICD-10-CM | POA: Diagnosis not present

## 2014-07-09 DIAGNOSIS — R05 Cough: Secondary | ICD-10-CM | POA: Diagnosis not present

## 2014-07-09 DIAGNOSIS — D320 Benign neoplasm of cerebral meninges: Secondary | ICD-10-CM | POA: Diagnosis not present

## 2014-07-09 DIAGNOSIS — E785 Hyperlipidemia, unspecified: Secondary | ICD-10-CM | POA: Diagnosis not present

## 2014-07-09 DIAGNOSIS — I517 Cardiomegaly: Secondary | ICD-10-CM | POA: Diagnosis not present

## 2014-07-09 LAB — CBC WITH DIFFERENTIAL/PLATELET
Basophils Absolute: 0 10*3/uL (ref 0.0–0.1)
Basophils Relative: 0 % (ref 0–1)
Eosinophils Absolute: 0 10*3/uL (ref 0.0–0.7)
Eosinophils Relative: 0 % (ref 0–5)
HCT: 38.7 % — ABNORMAL LOW (ref 39.0–52.0)
Hemoglobin: 12.8 g/dL — ABNORMAL LOW (ref 13.0–17.0)
Lymphocytes Relative: 13 % (ref 12–46)
Lymphs Abs: 1.2 10*3/uL (ref 0.7–4.0)
MCH: 30.5 pg (ref 26.0–34.0)
MCHC: 33.1 g/dL (ref 30.0–36.0)
MCV: 92.1 fL (ref 78.0–100.0)
Monocytes Absolute: 1 10*3/uL (ref 0.1–1.0)
Monocytes Relative: 10 % (ref 3–12)
Neutro Abs: 7 10*3/uL (ref 1.7–7.7)
Neutrophils Relative %: 76 % (ref 43–77)
Platelets: 97 10*3/uL — ABNORMAL LOW (ref 150–400)
RBC: 4.2 MIL/uL — ABNORMAL LOW (ref 4.22–5.81)
RDW: 14.2 % (ref 11.5–15.5)
WBC: 9.2 10*3/uL (ref 4.0–10.5)

## 2014-07-09 LAB — COMPREHENSIVE METABOLIC PANEL
ALT: 16 U/L (ref 0–53)
AST: 25 U/L (ref 0–37)
Albumin: 3.3 g/dL — ABNORMAL LOW (ref 3.5–5.2)
Alkaline Phosphatase: 61 U/L (ref 39–117)
Anion gap: 9 (ref 5–15)
BUN: 13 mg/dL (ref 6–23)
CO2: 24 mmol/L (ref 19–32)
Calcium: 10.2 mg/dL (ref 8.4–10.5)
Chloride: 104 mmol/L (ref 96–112)
Creatinine, Ser: 1.17 mg/dL (ref 0.50–1.35)
GFR calc Af Amer: 63 mL/min — ABNORMAL LOW (ref >90)
GFR calc non Af Amer: 54 mL/min — ABNORMAL LOW (ref >90)
Glucose, Bld: 106 mg/dL — ABNORMAL HIGH (ref 70–99)
Potassium: 3.4 mmol/L — ABNORMAL LOW (ref 3.5–5.1)
Sodium: 137 mmol/L (ref 135–145)
Total Bilirubin: 0.9 mg/dL (ref 0.3–1.2)
Total Protein: 6.8 g/dL (ref 6.0–8.3)

## 2014-07-09 LAB — URINALYSIS, ROUTINE W REFLEX MICROSCOPIC
Bilirubin Urine: NEGATIVE
Glucose, UA: NEGATIVE mg/dL
Ketones, ur: NEGATIVE mg/dL
Nitrite: NEGATIVE
Protein, ur: 100 mg/dL — AB
Specific Gravity, Urine: 1.017 (ref 1.005–1.030)
Urobilinogen, UA: 1 mg/dL (ref 0.0–1.0)
pH: 7.5 (ref 5.0–8.0)

## 2014-07-09 LAB — URINE MICROSCOPIC-ADD ON

## 2014-07-09 LAB — I-STAT CG4 LACTIC ACID, ED: Lactic Acid, Venous: 1.72 mmol/L (ref 0.5–2.0)

## 2014-07-09 MED ORDER — CEPHALEXIN 250 MG PO CAPS
500.0000 mg | ORAL_CAPSULE | Freq: Once | ORAL | Status: AC
  Administered 2014-07-09: 500 mg via ORAL
  Filled 2014-07-09: qty 2

## 2014-07-09 MED ORDER — ACETAMINOPHEN 325 MG PO TABS
325.0000 mg | ORAL_TABLET | Freq: Once | ORAL | Status: AC
  Administered 2014-07-09: 325 mg via ORAL
  Filled 2014-07-09: qty 1

## 2014-07-09 MED ORDER — CEPHALEXIN 500 MG PO CAPS: 500.0000 mg | ORAL_CAPSULE | Freq: Two times a day (BID) | ORAL | Status: DC

## 2014-07-09 MED ORDER — IOHEXOL 300 MG/ML  SOLN: 25.0000 mL | Freq: Once | INTRAMUSCULAR | Status: DC | PRN

## 2014-07-09 NOTE — ED Notes (Signed)
Patient transported to CT 

## 2014-07-09 NOTE — ED Notes (Signed)
Pt family sts generalized weakness; pt feels warm to touch; pt with cough

## 2014-07-09 NOTE — ED Notes (Signed)
Patient returned from [place]. MD at the bedside.

## 2014-07-09 NOTE — ED Notes (Signed)
Patient returned from CT

## 2014-07-09 NOTE — Discharge Instructions (Signed)
Today's chest x-ray did not show a pneumonia.  CAT scan of the head showed old stroke but no new changes.  Lab work was normal aside from bacteria in the urine.  It is possible that some of the weakness may be due to an early urinary tract infection.  Please take antibiotic as prescribed.  It is important for you to follow-up with her primary care doctor in 1-2 days for recheck.  Return to the emergency department if symptoms are worsening.  Have patient drink plenty of fluids, stick to a regular diet.   Fever, Adult A fever is a higher than normal body temperature. In an adult, an oral temperature around 98.6 F (37 C) is considered normal. A temperature of 100.4 F (38 C) or higher is generally considered a fever. Mild or moderate fevers generally have no long-term effects and often do not require treatment. Extreme fever (greater than or equal to 106 F or 41.1 C) can cause seizures. The sweating that may occur with repeated or prolonged fever may cause dehydration. Elderly people can develop confusion during a fever. A measured temperature can vary with:  Age.  Time of day.  Method of measurement (mouth, underarm, rectal, or ear). The fever is confirmed by taking a temperature with a thermometer. Temperatures can be taken different ways. Some methods are accurate and some are not.  An oral temperature is used most commonly. Electronic thermometers are fast and accurate.  An ear temperature will only be accurate if the thermometer is positioned as recommended by the manufacturer.  A rectal temperature is accurate and done for those adults who have a condition where an oral temperature cannot be taken.  An underarm (axillary) temperature is not accurate and not recommended. Fever is a symptom, not a disease.  CAUSES   Infections commonly cause fever.  Some noninfectious causes for fever include:  Some arthritis conditions.  Some thyroid or adrenal gland conditions.  Some immune  system conditions.  Some types of cancer.  A medicine reaction.  High doses of certain street drugs such as methamphetamine.  Dehydration.  Exposure to high outside or room temperatures.  Occasionally, the source of a fever cannot be determined. This is sometimes called a "fever of unknown origin" (FUO).  Some situations may lead to a temporary rise in body temperature that may go away on its own. Examples are:  Childbirth.  Surgery.  Intense exercise. HOME CARE INSTRUCTIONS   Take appropriate medicines for fever. Follow dosing instructions carefully. If you use acetaminophen to reduce the fever, be careful to avoid taking other medicines that also contain acetaminophen. Do not take aspirin for a fever if you are younger than age 76. There is an association with Reye's syndrome. Reye's syndrome is a rare but potentially deadly disease.  If an infection is present and antibiotics have been prescribed, take them as directed. Finish them even if you start to feel better.  Rest as needed.  Maintain an adequate fluid intake. To prevent dehydration during an illness with prolonged or recurrent fever, you may need to drink extra fluid.Drink enough fluids to keep your urine clear or pale yellow.  Sponging or bathing with room temperature water may help reduce body temperature. Do not use ice water or alcohol sponge baths.  Dress comfortably, but do not over-bundle. SEEK MEDICAL CARE IF:   You are unable to keep fluids down.  You develop vomiting or diarrhea.  You are not feeling at least partly better after 3 days.  You develop new symptoms or problems. SEEK IMMEDIATE MEDICAL CARE IF:   You have shortness of breath or trouble breathing.  You develop excessive weakness.  You are dizzy or you faint.  You are extremely thirsty or you are making little or no urine.  You develop new pain that was not there before (such as in the head, neck, chest, back, or abdomen).  You  have persistent vomiting and diarrhea for more than 1 to 2 days.  You develop a stiff neck or your eyes become sensitive to light.  You develop a skin rash.  You have a fever or persistent symptoms for more than 2 to 3 days.  You have a fever and your symptoms suddenly get worse. MAKE SURE YOU:   Understand these instructions.  Will watch your condition.  Will get help right away if you are not doing well or get worse. Document Released: 08/23/2000 Document Revised: 07/14/2013 Document Reviewed: 12/29/2010 [facility] Patient Information 2015 [place], [place]. This information is not intended to replace advice given to you by your health care provider. Make sure you discuss any questions you have with your health care provider.  Urinary Tract Infection Urinary tract infections (UTIs) can develop anywhere along your urinary tract. Your urinary tract is your body's drainage system for removing wastes and extra water. Your urinary tract includes two kidneys, two ureters, a bladder, and a urethra. Your kidneys are a pair of bean-shaped organs. Each kidney is about the size of your fist. They are located below your ribs, one on each side of your spine. CAUSES Infections are caused by microbes, which are microscopic organisms, including fungi, viruses, and bacteria. These organisms are so small that they can only be seen through a microscope. Bacteria are the microbes that most commonly cause UTIs. SYMPTOMS  Symptoms of UTIs may vary by age and gender of the patient and by the location of the infection. Symptoms in young women typically include a frequent and intense urge to urinate and a painful, burning feeling in the bladder or urethra during urination. Older women and men are more likely to be tired, shaky, and weak and have muscle aches and abdominal pain. A fever may mean the infection is in your kidneys. Other symptoms of a kidney infection include pain in your back or sides below the ribs,  nausea, and vomiting. DIAGNOSIS To diagnose a UTI, your caregiver will ask you about your symptoms. Your caregiver also will ask to provide a urine sample. The urine sample will be tested for bacteria and white blood cells. White blood cells are made by your body to help fight infection. TREATMENT  Typically, UTIs can be treated with medication. Because most UTIs are caused by a bacterial infection, they usually can be treated with the use of antibiotics. The choice of antibiotic and length of treatment depend on your symptoms and the type of bacteria causing your infection. HOME CARE INSTRUCTIONS  If you were prescribed antibiotics, take them exactly as your caregiver instructs you. Finish the medication even if you feel better after you have only taken some of the medication.  Drink enough water and fluids to keep your urine clear or pale yellow.  Avoid caffeine, tea, and carbonated beverages. They tend to irritate your bladder.  Empty your bladder often. Avoid holding urine for long periods of time.  Empty your bladder before and after sexual intercourse.  After a bowel movement, women should cleanse from front to back. Use each tissue only once. SEEK  MEDICAL CARE IF:   You have back pain.  You develop a fever.  Your symptoms do not begin to resolve within 3 days. SEEK IMMEDIATE MEDICAL CARE IF:   You have severe back pain or lower abdominal pain.  You develop chills.  You have nausea or vomiting.  You have continued burning or discomfort with urination. MAKE SURE YOU:   Understand these instructions.  Will watch your condition.  Will get help right away if you are not doing well or get worse. Document Released: 12/07/2004 Document Revised: 08/29/2011 Document Reviewed: 04/07/2011 [facility] Patient Information 2015 [place], [place]. This information is not intended to replace advice given to you by your health care provider. Make sure you discuss any questions you have with  your health care provider.  Weakness Weakness is a lack of strength. It may be felt all over the body (generalized) or in one specific part of the body (focal). Some causes of weakness can be serious. You may need further medical evaluation, especially if you are elderly or you have a history of immunosuppression (such as chemotherapy or HIV), kidney disease, heart disease, or diabetes. CAUSES  Weakness can be caused by many different things, including:  Infection.  Physical exhaustion.  Internal bleeding or other blood loss that results in a lack of red blood cells (anemia).  Dehydration. This cause is more common in elderly people.  Side effects or electrolyte abnormalities from medicines, such as pain medicines or sedatives.  Emotional distress, anxiety, or depression.  Circulation problems, especially severe peripheral arterial disease.  Heart disease, such as rapid atrial fibrillation, bradycardia, or heart failure.  Nervous system disorders, such as Guillain-Barr syndrome, multiple sclerosis, or stroke. DIAGNOSIS  To find the cause of your weakness, your caregiver will take your history and perform a physical exam. Lab tests or X-rays may also be ordered, if needed. TREATMENT  Treatment of weakness depends on the cause of your symptoms and can vary greatly. HOME CARE INSTRUCTIONS   Rest as needed.  Eat a well-balanced diet.  Try to get some exercise every day.  Only take over-the-counter or prescription medicines as directed by your caregiver. SEEK MEDICAL CARE IF:   Your weakness seems to be getting worse or spreads to other parts of your body.  You develop new aches or pains. SEEK IMMEDIATE MEDICAL CARE IF:   You cannot perform your normal daily activities, such as getting dressed and feeding yourself.  You cannot walk up and down stairs, or you feel exhausted when you do so.  You have shortness of breath or chest pain.  You have difficulty moving parts of  your body.  You have weakness in only one area of the body or on only one side of the body.  You have a fever.  You have trouble speaking or swallowing.  You cannot control your bladder or bowel movements.  You have black or bloody vomit or stools. MAKE SURE YOU:  Understand these instructions.  Will watch your condition.  Will get help right away if you are not doing well or get worse. Document Released: 02/27/2005 Document Revised: 08/29/2011 Document Reviewed: 04/28/2011 [facility] Patient Information 2015 [place], [place]. This information is not intended to replace advice given to you by your health care provider. Make sure you discuss any questions you have with your health care provider.

## 2014-07-09 NOTE — ED Provider Notes (Signed)
CSN: [id number]     Arrival date & time 07/09/14  1229 History   First MD Initiated Contact with Patient 07/09/14 1308     Chief Complaint  Patient presents with  . Weakness  . Fever     (Consider location/radiation/quality/duration/timing/severity/associated sxs/prior Treatment) HPI 81 year old male presents to emergency department from home with complaint of right-sided weakness, cough, warmth to touch.  The patient has history of stroke with right-sided deficits.  Daughter reports he normally walks with a walker, and since Tuesday, 2 days ago, has had more difficulties moving his right leg and keeping himself upright.  He has not complained of any leg pain.  No chest pain, no shortness of breath, no abdominal pain.  No urinary symptoms.  Patient is eating and drinking well. Past Medical History  Diagnosis Date  . Hypertension   . Difficulty hearing, right     BILATERAL HEARING LOSS - BEST TO TRY TO SPEAK INTO LEFT EAR  . Meningioma   . Frequent falls   . BPH (benign prostatic hypertrophy)     TURP 05/19/13  . Chronic kidney disease     CHRONIC KIDNEY DISEASE, 2  . Anemia   . Thrombocytopenia   . Stroke     Cerebellar, 2013; WALKS WITH WALKER, ABLE TO DRESS AND BATHE HIMSELF BUT FAMILY TRIES TO PROVIDE SUPERVISION BECAUSE OF HIS HX OF FALL AND WEAKNESS LEGS, ARMS   . Incontinence of urine     SOME INCONTINENCE  . Hyperlipidemia   . DVT (deep venous thrombosis)    Past Surgical History  Procedure Laterality Date  . Tube put in ear yrs ago    . Transurethral resection of prostate N/A 05/19/2013    Procedure: TRANSURETHRAL RESECTION OF THE PROSTATE WITH GYRUS INSTRUMENTS;  Surgeon: Ailene Rud, MD;  Location: [facility];  Service: Urology;  Laterality: N/A;  . Cystoscopy N/A 06/13/2013    Procedure: CYSTOSCOPY FLEXIBLE BEDSIDE;  Surgeon: Ardis Hughs, MD;  Location: [place];  Service: Urology;  Laterality: N/A;   Family History  Problem Relation Age of Onset  . Diabetes  Mother   . Heart disease Mother   . Hypertension Mother   . Heart disease Father   . Hypertension Father   . Hypertension Daughter    History  Substance Use Topics  . Smoking status: Former Smoker    Types: Cigarettes  . Smokeless tobacco: Not on file  . Alcohol Use: No     Comment: QUIT SMOKING 8 YRS AGO    Review of Systems   See History of Present Illness; otherwise all other systems are reviewed and negative  Allergies  Review of patient's allergies indicates no known allergies.  Home Medications   Prior to Admission medications   Medication Sig Start Date End Date Taking? Authorizing Provider  atorvastatin (LIPITOR) 20 MG tablet Take 1 tablet (20 mg total) by mouth daily at 6 PM. 01/15/14   [name], DO  benazepril (LOTENSIN) 20 MG tablet Take 1 tablet (20 mg total) by mouth daily. 06/11/14   Francesca Oman, DO  Cholecalciferol (VITAMIN D PO) Take 1 tablet by mouth daily.    Historical Provider, MD  Iron-Vitamins (GERITOL PO) Take 1 tablet by mouth daily.    Historical Provider, MD  metoprolol tartrate (LOPRESSOR) 25 MG tablet Take 1 tablet (25 mg total) by mouth 2 (two) times daily. 01/15/14   [name], DO  omega-3 acid ethyl esters (LOVAZA) 1 G capsule Take 1 capsule (  1 g total) by mouth daily. 05/12/14   [name], DO  Rivaroxaban (XARELTO) 15 MG TABS tablet Take 1 tablet (15 mg total) by mouth daily with supper. 03/26/14   [name], DO   BP 155/93 mmHg  Pulse 64  Temp(Src) 100.3 F (37.9 C) (Rectal)  Resp 19  SpO2 100% Physical Exam  Constitutional: He is oriented to person, place, and time. He appears well-developed and well-nourished.  HENT:  Head: Normocephalic and atraumatic.  Nose: Nose normal.  Mouth/Throat: Oropharynx is clear and moist.  Eyes: Conjunctivae and EOM are normal. Pupils are equal, round, and reactive to light.  Neck: Normal range of motion. Neck supple. No JVD present. No tracheal deviation present. No thyromegaly present.   Cardiovascular: Normal rate, regular rhythm, normal heart sounds and intact distal pulses.  Exam reveals no gallop and no friction rub.   No murmur heard. Pulmonary/Chest: Effort normal and breath sounds normal. No stridor. No respiratory distress. He has no wheezes. He has no rales. He exhibits no tenderness.  Cough present  Abdominal: Soft. Bowel sounds are normal. He exhibits no distension and no mass. There is no tenderness. There is no rebound and no guarding.  Musculoskeletal: Normal range of motion. He exhibits no edema or tenderness.  Lymphadenopathy:    He has no cervical adenopathy.  Neurological: He is alert and oriented to person, place, and time. He displays normal reflexes. He exhibits normal muscle tone. Coordination normal.  Skin: Skin is warm and dry. No rash noted. No erythema. No pallor.  Psychiatric: He has a normal mood and affect. His behavior is normal. Judgment and thought content normal.  Nursing note and vitals reviewed.   ED Course  Procedures (including critical care time) Labs Review Labs Reviewed  CBC WITH DIFFERENTIAL/PLATELET - Abnormal; Notable for the following:    RBC 4.20 (*)    Hemoglobin 12.8 (*)    HCT 38.7 (*)    All other components within normal limits  COMPREHENSIVE METABOLIC PANEL - Abnormal; Notable for the following:    Potassium 3.4 (*)    Glucose, Bld 106 (*)    Albumin 3.3 (*)    GFR calc non Af Amer 54 (*)    GFR calc Af Amer 63 (*)    All other components within normal limits  URINALYSIS, ROUTINE W REFLEX MICROSCOPIC  I-STAT CG4 LACTIC ACID, ED    Imaging Review Dg Chest 2 View  07/09/2014   CLINICAL DATA:  Increasing weakness since 07/06/2014.  EXAM: CHEST  2 VIEW  COMPARISON:  PA and lateral chest 12/18/2013.  CT chest 08/21/2013.  FINDINGS: The lungs are clear. No pneumothorax or pleural effusion. Cardiomegaly is again seen.  IMPRESSION: Cardiomegaly without acute disease.   Electronically Signed   By: Inge Rise M.D.    On: 07/09/2014 13:54     EKG Interpretation   Date/Time:  Thursday July 09 2014 12:49:46 EDT Ventricular Rate:  67 PR Interval:  190 QRS Duration: 110 QT Interval:  396 QTC Calculation: 418 R Axis:   -12 Text Interpretation:  Normal sinus rhythm Moderate voltage criteria for  LVH, may be normal variant Nonspecific ST and T wave abnormality Abnormal  ECG Confirmed by Hazle Coca [id number]) on 07/09/2014 12:55:21 PM      MDM   Final diagnoses:  Weakness  Febrile illness  Urinary tract infection without hematuria, site unspecified   81 year old male with fever, weakness, history of right-sided deficits from stroke that are worse over the  last few days.  Chest x-ray without signs of pneumonia.  Plan for head CT, urinalysis, to investigate source of fever.  Overall patient looks well, not septic appearing.  4:52 PM Patient with low-grade fever, urinalysis with possible UTI, will cover with Keflex.  No pneumonia, no confusion, more recent usual.  Will have him follow-up with his primary care doctor in 24-48 hours for recheck.  Linton Flemings, MD 07/09/14 [phone]

## 2014-07-11 LAB — URINE CULTURE: Colony Count: >=100000

## 2014-07-13 ENCOUNTER — Telehealth (HOSPITAL_COMMUNITY): Payer: Self-pay

## 2014-07-13 NOTE — Telephone Encounter (Signed)
Post ED Visit - Positive Culture Follow-up  Culture report reviewed by antimicrobial stewardship pharmacist: []  Wes Dulaney, Pharm.D., BCPS []  Heide Guile, Pharm.D., BCPS []  Alycia Rossetti, Pharm.D., BCPS []  [place], Pharm.D., BCPS, AAHIVP []  Legrand Como, Pharm.D., BCPS, AAHIVP []  Isac Sarna, Pharm.D., BCPS X  Anette Guarneri, Pharm.D.  Positive Urine culture, >/= 100,000 colonies -> Group B Strep Treated with Cephalexin, organism sensitive to the same and no further patient follow-up is required at this time.  [name] 07/13/2014, 4:19 AM

## 2014-07-16 ENCOUNTER — Encounter: Payer: Self-pay | Admitting: Pharmacist

## 2014-07-16 ENCOUNTER — Encounter: Payer: Self-pay | Admitting: Internal Medicine

## 2014-07-16 ENCOUNTER — Ambulatory Visit (INDEPENDENT_AMBULATORY_CARE_PROVIDER_SITE_OTHER): Payer: Medicare Other | Admitting: Internal Medicine

## 2014-07-16 VITALS — BP 176/88 | HR 58 | Temp 98.2°F | Ht 71.0 in | Wt 164.6 lb

## 2014-07-16 DIAGNOSIS — B951 Streptococcus, group B, as the cause of diseases classified elsewhere: Secondary | ICD-10-CM

## 2014-07-16 DIAGNOSIS — Z95828 Presence of other vascular implants and grafts: Secondary | ICD-10-CM | POA: Diagnosis not present

## 2014-07-16 DIAGNOSIS — I10 Essential (primary) hypertension: Secondary | ICD-10-CM

## 2014-07-16 DIAGNOSIS — N390 Urinary tract infection, site not specified: Secondary | ICD-10-CM

## 2014-07-16 MED ORDER — BENAZEPRIL HCL 20 MG PO TABS: 20.0000 mg | ORAL_TABLET | Freq: Every day | ORAL | Status: DC

## 2014-07-16 NOTE — Progress Notes (Signed)
[facility] Subjective:   Patient ID: [name] male   DOB: 14-Mar-1924 81 y.o.   MRN: [id number]  HPI: [name] is a 81 y.o. male with a PMH detailed below who presents for ED follow up.  He was seen one week ago in the ED after he started acting strange at home.  Daughter reports he was leaning to the side and not eating very well, he also had a mild cough.  Workup in the ED was unremarkable for PNA but did suggest a possible UTI and he was started on a course of Keflex.  His daughter reports he responded very well to the antibiotic and is doing very well now.  He still has a mild lingering cough but is back to his old self.  He personally denies any complaints.    Past Medical History  Diagnosis Date  . Hypertension   . Difficulty hearing, right     BILATERAL HEARING LOSS - BEST TO TRY TO SPEAK INTO LEFT EAR  . Meningioma   . Frequent falls   . BPH (benign prostatic hypertrophy)     TURP 05/19/13  . Chronic kidney disease     CHRONIC KIDNEY DISEASE, 2  . Anemia   . Thrombocytopenia   . Stroke     Cerebellar, 2013; WALKS WITH WALKER, ABLE TO DRESS AND BATHE HIMSELF BUT FAMILY TRIES TO PROVIDE SUPERVISION BECAUSE OF HIS HX OF FALL AND WEAKNESS LEGS, ARMS   . Incontinence of urine     SOME INCONTINENCE  . Hyperlipidemia   . DVT (deep venous thrombosis)    Current Outpatient Prescriptions  Medication Sig Dispense Refill  . atorvastatin (LIPITOR) 20 MG tablet Take 1 tablet (20 mg total) by mouth daily at 6 PM. 90 tablet 3  . benazepril (LOTENSIN) 20 MG tablet Take 1 tablet (20 mg total) by mouth daily. 30 tablet 11  . cephALEXin (KEFLEX) 500 MG capsule Take 1 capsule (500 mg total) by mouth 2 (two) times daily. 14 capsule 0  . metoprolol tartrate (LOPRESSOR) 25 MG tablet Take 1 tablet (25 mg total) by mouth 2 (two) times daily. 180 tablet 3  . Rivaroxaban (XARELTO) 15 MG TABS tablet Take 1 tablet (15 mg total) by mouth daily with supper. 90 tablet  1   No current facility-administered medications for this visit.   Family History  Problem Relation Age of Onset  . Diabetes Mother   . Heart disease Mother   . Hypertension Mother   . Heart disease Father   . Hypertension Father   . Hypertension Daughter    History   Social History  . Marital Status: Widowed    Spouse Name: N/A  . Number of Children: N/A  . Years of Education: N/A   Social History Main Topics  . Smoking status: Former Smoker    Types: Cigarettes  . Smokeless tobacco: Not on file  . Alcohol Use: No     Comment: QUIT SMOKING 50 YRS AGO  . Drug Use: No  . Sexual Activity: No   Other Topics Concern  . None   Social History Narrative   Lives with daughter. Ambulates w walker. Can make basic meals (cereal). Very hard of hearing.    Review of Systems: Review of Systems  Constitutional: Negative for fever, chills, weight loss and malaise/fatigue.  Eyes: Negative for blurred vision.  Respiratory: Positive for cough (mild, improving). Negative for hemoptysis, sputum production and shortness of breath.   Cardiovascular:  Negative for chest pain and leg swelling.  Gastrointestinal: Negative for heartburn, abdominal pain and blood in stool.  Genitourinary: Negative for dysuria, urgency and frequency.  Musculoskeletal: Negative for myalgias.  Neurological: Negative for dizziness and headaches.  Endo/Heme/Allergies: Negative for polydipsia.  Psychiatric/Behavioral: Negative for substance abuse.     Objective:  Physical Exam: Filed Vitals:   07/16/14 1530  BP: 176/88  Pulse: 58  Temp: 98.2 F (36.8 C)  TempSrc: Oral  Height: 5\' 11"  (1.803 m)  Weight: 164 lb 9.6 oz (74.662 kg)  SpO2: 96%  Physical Exam  Constitutional: He is well-developed, well-nourished, and in no distress.  In wheelchair, HOH  HENT:  Head: Normocephalic and atraumatic.  Cardiovascular: Normal rate, regular rhythm and normal heart sounds.   Pulmonary/Chest: Effort normal and breath  sounds normal. No respiratory distress. He has no wheezes.  Abdominal: Soft. Bowel sounds are normal. He exhibits no distension. There is no tenderness.  Musculoskeletal: He exhibits no edema.  Nursing note and vitals reviewed.   Assessment & Plan:  Case discussed with Dr. Dareen Piano  HTN (hypertension) BP Readings from Last 3 Encounters:  07/16/14 176/88  07/09/14 151/82  06/11/14 188/95    Lab Results  Component Value Date   NA 137 07/09/2014   K 3.4* 07/09/2014   CREATININE 1.17 07/09/2014    Assessment: Blood pressure control: moderately elevated Progress toward BP goal:  deteriorated Comments:   Plan: Medications:  Continue benazepril 20mg  and Metoprolol 25mg  BID Educational resources provided: brochure (denies) Self management tools provided: home blood pressure logbook Other plans: no change at this point in BP meds as has been mostly controlled recently.    Urinary tract infection, acute - UCx grew GBS, and patient has one day left to finish Abx course of keflex. - Otherwise resolved.   Presence of IVC filter - Patient still has retriveable IVC filter, he no longer has an indication for it and is on Xarelto now. - I have already placed a referral to IR for removal, I have asked my nurse Regino Schultze to call IR, she left message to call patient about potential removal.      Medications Ordered Meds ordered this encounter  Medications  . benazepril (LOTENSIN) 20 MG tablet    Sig: Take 1 tablet (20 mg total) by mouth daily.    Dispense:  30 tablet    Refill:  11   Other Orders No orders of the defined types were placed in this encounter.

## 2014-07-16 NOTE — Patient Instructions (Signed)
General Instructions:  Please finish the last 2 pills of the antibiotic.  We are here when you need [place].  Thank you for bringing your medicines today. This helps [place] keep you safe from mistakes.   Progress Toward Treatment Goals:  Treatment Goal 03/26/2014  Blood pressure unchanged  Prevent falls -    Self Care Goals & Plans:  Self Care Goal 07/16/2014  Manage my medications take my medicines as prescribed; bring my medications to every visit; refill my medications on time  Monitor my health -  Eat healthy foods drink diet soda or water instead of juice or soda; eat more vegetables; eat foods that are low in salt; eat baked foods instead of fried foods; eat fruit for snacks and desserts  Be physically active -  Meeting treatment goals -    No flowsheet data found.   Care Management & Community Referrals:  Referral 03/26/2014  Referrals made for care management support none needed  Referrals made to community resources -

## 2014-07-17 DIAGNOSIS — N390 Urinary tract infection, site not specified: Secondary | ICD-10-CM | POA: Insufficient documentation

## 2014-07-17 DIAGNOSIS — Z95828 Presence of other vascular implants and grafts: Secondary | ICD-10-CM | POA: Insufficient documentation

## 2014-07-17 NOTE — Assessment & Plan Note (Signed)
BP Readings from Last 3 Encounters:  07/16/14 176/88  07/09/14 151/82  06/11/14 188/95    Lab Results  Component Value Date   NA 137 07/09/2014   K 3.4* 07/09/2014   CREATININE 1.17 07/09/2014    Assessment: Blood pressure control: moderately elevated Progress toward BP goal:  deteriorated Comments:   Plan: Medications:  Continue benazepril 20mg  and Metoprolol 25mg  BID Educational resources provided: brochure (denies) Self management tools provided: home blood pressure logbook Other plans: no change at this point in BP meds as has been mostly controlled recently.

## 2014-07-17 NOTE — Assessment & Plan Note (Signed)
-   UCx grew GBS, and patient has one day left to finish Abx course of keflex. - Otherwise resolved.

## 2014-07-17 NOTE — Assessment & Plan Note (Signed)
-   Patient still has retriveable IVC filter, he no longer has an indication for it and is on Xarelto now. - I have already placed a referral to IR for removal, I have asked my nurse Regino Schultze to call IR, she left message to call patient about potential removal.

## 2014-07-22 ENCOUNTER — Other Ambulatory Visit: Payer: Self-pay | Admitting: Internal Medicine

## 2014-07-30 ENCOUNTER — Telehealth: Payer: Self-pay | Admitting: *Deleted

## 2014-07-30 MED ORDER — IRBESARTAN 150 MG PO TABS: 150.0000 mg | ORAL_TABLET | Freq: Every day | ORAL | Status: DC

## 2014-07-30 NOTE — Telephone Encounter (Signed)
Spoke with daughter, will try change to Irbesartan 150mg  and d/c benazepril.

## 2014-07-30 NOTE — Telephone Encounter (Signed)
Daughter called stating the Benazepril is making patient cough more than he was back when he was on a smaller dose. Is there anything that will help with this?  # [phone]

## 2014-08-13 ENCOUNTER — Encounter: Payer: Self-pay | Admitting: Internal Medicine

## 2014-10-27 ENCOUNTER — Other Ambulatory Visit: Payer: Self-pay | Admitting: Internal Medicine

## 2014-10-27 MED ORDER — RIVAROXABAN 15 MG PO TABS: ORAL_TABLET | ORAL | Status: DC

## 2014-10-27 NOTE — Telephone Encounter (Signed)
Pt daughter called requesting xarelto to be filled. Pt has been out for 2 days.

## 2014-10-28 ENCOUNTER — Telehealth: Payer: Self-pay | Admitting: *Deleted

## 2014-10-28 NOTE — Telephone Encounter (Signed)
Call to [facility] [phone]  Pt with hx of DVT medication approved through 10/28/2015 under pt's medicare part D plan.Despina Hidden Cassady8/17/20165:02 PM  Ref# [id number]  PHARMACY AND PT'S FAMILY AWARE.Despina Hidden Cassady8/17/20165:08 PM

## 2014-11-30 ENCOUNTER — Other Ambulatory Visit: Payer: Self-pay | Admitting: *Deleted

## 2014-11-30 MED ORDER — IRBESARTAN 150 MG PO TABS: 150.0000 mg | ORAL_TABLET | Freq: Every day | ORAL | Status: DC

## 2015-02-11 ENCOUNTER — Encounter: Payer: Self-pay | Admitting: Internal Medicine

## 2015-02-11 ENCOUNTER — Ambulatory Visit (INDEPENDENT_AMBULATORY_CARE_PROVIDER_SITE_OTHER): Payer: Medicare Other | Admitting: Internal Medicine

## 2015-02-11 VITALS — BP 195/94 | HR 50 | Temp 97.6°F | Ht 71.0 in | Wt 168.8 lb

## 2015-02-11 DIAGNOSIS — I82509 Chronic embolism and thrombosis of unspecified deep veins of unspecified lower extremity: Secondary | ICD-10-CM

## 2015-02-11 DIAGNOSIS — E785 Hyperlipidemia, unspecified: Secondary | ICD-10-CM

## 2015-02-11 DIAGNOSIS — Z7901 Long term (current) use of anticoagulants: Secondary | ICD-10-CM | POA: Diagnosis not present

## 2015-02-11 DIAGNOSIS — I10 Essential (primary) hypertension: Secondary | ICD-10-CM

## 2015-02-11 DIAGNOSIS — N400 Enlarged prostate without lower urinary tract symptoms: Secondary | ICD-10-CM | POA: Diagnosis not present

## 2015-02-11 DIAGNOSIS — Z95828 Presence of other vascular implants and grafts: Secondary | ICD-10-CM

## 2015-02-11 MED ORDER — IRBESARTAN 300 MG PO TABS: 300.0000 mg | ORAL_TABLET | Freq: Every day | ORAL | Status: DC

## 2015-02-11 MED ORDER — ATORVASTATIN CALCIUM 20 MG PO TABS: 20.0000 mg | ORAL_TABLET | Freq: Every day | ORAL | Status: DC

## 2015-02-11 MED ORDER — METOPROLOL TARTRATE 25 MG PO TABS: 25.0000 mg | ORAL_TABLET | Freq: Two times a day (BID) | ORAL | Status: DC

## 2015-02-11 MED ORDER — RIVAROXABAN 15 MG PO TABS: ORAL_TABLET | ORAL | Status: DC

## 2015-02-11 NOTE — Progress Notes (Signed)
[facility] Subjective:   Patient ID: [name] male   DOB: 03-30-1924 81 y.o.   MRN: [id number]  HPI: [name] is a 81 y.o. male with a PMH detailed below who presents for 6 month follow up of HTN, chronic A/C  Please see A&P below for the status of his chronic medical issues.  Past Medical History  Diagnosis Date  . Hypertension   . Difficulty hearing, right     BILATERAL HEARING LOSS - BEST TO TRY TO SPEAK INTO LEFT EAR  . Meningioma ([place])   . Frequent falls   . BPH (benign prostatic hypertrophy)     TURP 05/19/13  . Chronic kidney disease     CHRONIC KIDNEY DISEASE, 2  . Anemia   . Thrombocytopenia ([place])   . Stroke ([place])     Cerebellar, 2013; WALKS WITH WALKER, ABLE TO DRESS AND BATHE HIMSELF BUT FAMILY TRIES TO PROVIDE SUPERVISION BECAUSE OF HIS HX OF FALL AND WEAKNESS LEGS, ARMS   . Incontinence of urine     SOME INCONTINENCE  . Hyperlipidemia   . DVT (deep venous thrombosis) ([place])    Current Outpatient Prescriptions  Medication Sig Dispense Refill  . atorvastatin (LIPITOR) 20 MG tablet Take 1 tablet (20 mg total) by mouth daily at 6 PM. 90 tablet 3  . cephALEXin (KEFLEX) 500 MG capsule Take 1 capsule (500 mg total) by mouth 2 (two) times daily. 14 capsule 0  . irbesartan (AVAPRO) 150 MG tablet Take 1 tablet (150 mg total) by mouth daily. 90 tablet 3  . metoprolol tartrate (LOPRESSOR) 25 MG tablet Take 1 tablet (25 mg total) by mouth 2 (two) times daily. 180 tablet 3  . Rivaroxaban (XARELTO) 15 MG TABS tablet take 1 tablet by mouth once daily WITH SUPPER 30 tablet 3   No current facility-administered medications for this visit.   Family History  Problem Relation Age of Onset  . Diabetes Mother   . Heart disease Mother   . Hypertension Mother   . Heart disease Father   . Hypertension Father   . Hypertension Daughter    Social History   Social History  . Marital Status: Widowed    Spouse Name: N/A  . Number of Children:  N/A  . Years of Education: N/A   Social History Main Topics  . Smoking status: Former Smoker    Types: Cigarettes  . Smokeless tobacco: None  . Alcohol Use: No     Comment: QUIT SMOKING 50 YRS AGO  . Drug Use: No  . Sexual Activity: No   Other Topics Concern  . None   Social History Narrative   Lives with daughter. Ambulates w walker. Can make basic meals (cereal). Very hard of hearing.    Review of Systems: Review of Systems  Respiratory: Negative for shortness of breath.   Cardiovascular: Negative for chest pain and leg swelling.  Gastrointestinal: Negative for abdominal pain.  Genitourinary: Negative for dysuria, urgency, frequency and hematuria.  Musculoskeletal: Negative for falls.     Objective:  Physical Exam: Filed Vitals:   02/11/15 1624  BP: 195/94  Pulse: 50  Temp: 97.6 F (36.4 C)  TempSrc: Oral  Height: 5\' 11"  (1.803 m)  Weight: 168 lb 12.8 oz (76.567 kg)  SpO2: 99%  Physical Exam  Constitutional: He is well-developed, well-nourished, and in no distress.  Elderly man in wheelchair, HOH but very pleasant and responds appropriately  HENT:  Head: Normocephalic and atraumatic.  Cardiovascular:  Normal rate and regular rhythm.   No murmur heard. Pulmonary/Chest: Effort normal and breath sounds normal. He has no wheezes. He has no rales.  Abdominal: Soft. Bowel sounds are normal. There is no tenderness.  Musculoskeletal: He exhibits no edema.  Neurological: He is alert.  Nursing note and vitals reviewed.   Assessment & Plan:  Case discussed with Dr. Eppie Gibson  Presence of IVC filter As far as I know the IVC filter is still in place.  We tried to refer back to IR a few months back to see about removal.  I do not see removal documented in the EMR.  At this point it has been a year and a half since it was placed, it has not caused any problems.  I discussed this with [name] and his daughter and the fact that I am unsure if it is even possible at this point to  remove.  They report they are not concerned about it at this time.  Long term current use of anticoagulant therapy- Xarelto Denies any bleeding.  Has tolerated Xarelto well. Risks and benefits discussed and patient and daughter want to continue. -Refilled Xarelto  BPH (benign prostatic hyperplasia) - Denies any urinary retention or dribbling.  Doing very well post TURP  Leg DVT (deep venous thromboembolism), chronic - Continue Xarelto  HTN (hypertension) BP elevated today, since the last visit we changed Benazepril to Irbesartan 150mg  daily due to cough. He has been taking without issue.  He denies any cough.  A: Essential HTN  P:  - Above goal - Increase Irbesartan to 300mg  daily.    Hyperlipidemia -Stable -Continue Atrovastatin 20mg  daily    Medications Ordered Meds ordered this encounter  Medications  . irbesartan (AVAPRO) 300 MG tablet    Sig: Take 1 tablet (300 mg total) by mouth daily.    Dispense:  90 tablet    Refill:  3  . metoprolol tartrate (LOPRESSOR) 25 MG tablet    Sig: Take 1 tablet (25 mg total) by mouth 2 (two) times daily.    Dispense:  180 tablet    Refill:  3  . Rivaroxaban (XARELTO) 15 MG TABS tablet    Sig: take 1 tablet by mouth once daily WITH SUPPER    Dispense:  90 tablet    Refill:  3  . atorvastatin (LIPITOR) 20 MG tablet    Sig: Take 1 tablet (20 mg total) by mouth daily at 6 PM.    Dispense:  90 tablet    Refill:  3   Other Orders No orders of the defined types were placed in this encounter.   Follow Up: Return in about 6 months (around 08/12/2015).

## 2015-02-11 NOTE — Patient Instructions (Signed)
General Instructions:  I am increasing your irbesartan to 300mg  a day.  Please bring your medicines with you each time you come to clinic.  Medicines may include prescription medications, over-the-counter medications, herbal remedies, eye drops, vitamins, or other pills.   Progress Toward Treatment Goals:  Treatment Goal 07/16/2014  Blood pressure deteriorated  Prevent falls -    Self Care Goals & Plans:  Self Care Goal 07/16/2014  Manage my medications take my medicines as prescribed; bring my medications to every visit; refill my medications on time  Monitor my health -  Eat healthy foods drink diet soda or water instead of juice or soda; eat more vegetables; eat foods that are low in salt; eat baked foods instead of fried foods; eat fruit for snacks and desserts  Be physically active -  Meeting treatment goals -    No flowsheet data found.   Care Management & Community Referrals:  Referral 07/16/2014  Referrals made for care management support none needed  Referrals made to community resources -

## 2015-02-13 NOTE — Assessment & Plan Note (Signed)
Continue Xarelto 

## 2015-02-13 NOTE — Assessment & Plan Note (Signed)
As far as I know the IVC filter is still in place.  We tried to refer back to IR a few months back to see about removal.  I do not see removal documented in the EMR.  At this point it has been a year and a half since it was placed, it has not caused any problems.  I discussed this with [name] and his daughter and the fact that I am unsure if it is even possible at this point to remove.  They report they are not concerned about it at this time.

## 2015-02-13 NOTE — Assessment & Plan Note (Signed)
-  Stable -Continue Atrovastatin 20mg  daily

## 2015-02-13 NOTE — Assessment & Plan Note (Signed)
BP elevated today, since the last visit we changed Benazepril to Irbesartan 150mg  daily due to cough. He has been taking without issue.  He denies any cough.  A: Essential HTN  P:  - Above goal - Increase Irbesartan to 300mg  daily.

## 2015-02-13 NOTE — Assessment & Plan Note (Signed)
Denies any bleeding.  Has tolerated Xarelto well. Risks and benefits discussed and patient and daughter want to continue. -Refilled Xarelto

## 2015-02-13 NOTE — Assessment & Plan Note (Signed)
-   Denies any urinary retention or dribbling.  Doing very well post TURP

## 2015-02-18 ENCOUNTER — Other Ambulatory Visit: Payer: Self-pay | Admitting: Internal Medicine

## 2015-02-18 DIAGNOSIS — I82509 Chronic embolism and thrombosis of unspecified deep veins of unspecified lower extremity: Secondary | ICD-10-CM

## 2015-02-26 ENCOUNTER — Encounter: Payer: Self-pay | Admitting: Internal Medicine

## 2015-02-26 ENCOUNTER — Encounter (HOSPITAL_COMMUNITY): Payer: Self-pay | Admitting: General Practice

## 2015-02-26 ENCOUNTER — Ambulatory Visit (INDEPENDENT_AMBULATORY_CARE_PROVIDER_SITE_OTHER): Payer: Medicare Other | Admitting: Internal Medicine

## 2015-02-26 ENCOUNTER — Inpatient Hospital Stay (HOSPITAL_COMMUNITY)
Admission: AD | Admit: 2015-02-26 | Discharge: 2015-03-01 | DRG: 689 | Disposition: A | Discharge status: Skilled Nursing Facility | Payer: Medicare Other | Source: Ambulatory Visit | Attending: Student in an Organized Health Care Education/Training Program | Admitting: Student in an Organized Health Care Education/Training Program

## 2015-02-26 VITALS — BP 168/85 | HR 65 | Temp 99.1°F | Ht 71.0 in | Wt 168.0 lb

## 2015-02-26 DIAGNOSIS — H9190 Unspecified hearing loss, unspecified ear: Secondary | ICD-10-CM | POA: Diagnosis present

## 2015-02-26 DIAGNOSIS — N182 Chronic kidney disease, stage 2 (mild): Secondary | ICD-10-CM | POA: Diagnosis not present

## 2015-02-26 DIAGNOSIS — Z833 Family history of diabetes mellitus: Secondary | ICD-10-CM

## 2015-02-26 DIAGNOSIS — Z7901 Long term (current) use of anticoagulants: Secondary | ICD-10-CM

## 2015-02-26 DIAGNOSIS — Z8673 Personal history of transient ischemic attack (TIA), and cerebral infarction without residual deficits: Secondary | ICD-10-CM

## 2015-02-26 DIAGNOSIS — E870 Hyperosmolality and hypernatremia: Secondary | ICD-10-CM | POA: Diagnosis present

## 2015-02-26 DIAGNOSIS — G9340 Encephalopathy, unspecified: Secondary | ICD-10-CM | POA: Diagnosis present

## 2015-02-26 DIAGNOSIS — I10 Essential (primary) hypertension: Secondary | ICD-10-CM | POA: Diagnosis not present

## 2015-02-26 DIAGNOSIS — Z8249 Family history of ischemic heart disease and other diseases of the circulatory system: Secondary | ICD-10-CM

## 2015-02-26 DIAGNOSIS — N390 Urinary tract infection, site not specified: Secondary | ICD-10-CM | POA: Diagnosis present

## 2015-02-26 DIAGNOSIS — N401 Enlarged prostate with lower urinary tract symptoms: Secondary | ICD-10-CM | POA: Diagnosis not present

## 2015-02-26 DIAGNOSIS — D62 Acute posthemorrhagic anemia: Secondary | ICD-10-CM | POA: Diagnosis not present

## 2015-02-26 DIAGNOSIS — R531 Weakness: Secondary | ICD-10-CM

## 2015-02-26 DIAGNOSIS — R319 Hematuria, unspecified: Secondary | ICD-10-CM | POA: Diagnosis not present

## 2015-02-26 DIAGNOSIS — Z86718 Personal history of other venous thrombosis and embolism: Secondary | ICD-10-CM

## 2015-02-26 DIAGNOSIS — Z87891 Personal history of nicotine dependence: Secondary | ICD-10-CM

## 2015-02-26 DIAGNOSIS — N3001 Acute cystitis with hematuria: Secondary | ICD-10-CM | POA: Diagnosis not present

## 2015-02-26 DIAGNOSIS — E785 Hyperlipidemia, unspecified: Secondary | ICD-10-CM | POA: Diagnosis not present

## 2015-02-26 DIAGNOSIS — B9620 Unspecified Escherichia coli [E. coli] as the cause of diseases classified elsewhere: Secondary | ICD-10-CM | POA: Diagnosis present

## 2015-02-26 DIAGNOSIS — R269 Unspecified abnormalities of gait and mobility: Secondary | ICD-10-CM

## 2015-02-26 DIAGNOSIS — R296 Repeated falls: Secondary | ICD-10-CM

## 2015-02-26 DIAGNOSIS — I129 Hypertensive chronic kidney disease with stage 1 through stage 4 chronic kidney disease, or unspecified chronic kidney disease: Secondary | ICD-10-CM | POA: Diagnosis present

## 2015-02-26 DIAGNOSIS — Z95828 Presence of other vascular implants and grafts: Secondary | ICD-10-CM

## 2015-02-26 LAB — COMPREHENSIVE METABOLIC PANEL
ALT: 19 U/L (ref 17–63)
AST: 28 U/L (ref 15–41)
Albumin: 3 g/dL — ABNORMAL LOW (ref 3.5–5.0)
Alkaline Phosphatase: 51 U/L (ref 38–126)
Anion gap: 8 (ref 5–15)
BUN: 18 mg/dL (ref 6–20)
CO2: 26 mmol/L (ref 22–32)
Calcium: 10.3 mg/dL (ref 8.9–10.3)
Chloride: 114 mmol/L — ABNORMAL HIGH (ref 101–111)
Creatinine, Ser: 1.26 mg/dL — ABNORMAL HIGH (ref 0.61–1.24)
GFR calc Af Amer: 57 mL/min — ABNORMAL LOW (ref >60)
GFR calc non Af Amer: 49 mL/min — ABNORMAL LOW (ref >60)
Glucose, Bld: 119 mg/dL — ABNORMAL HIGH (ref 65–99)
Potassium: 3.9 mmol/L (ref 3.5–5.1)
Sodium: 148 mmol/L — ABNORMAL HIGH (ref 135–145)
Total Bilirubin: 1.1 mg/dL (ref 0.3–1.2)
Total Protein: 6.9 g/dL (ref 6.5–8.1)

## 2015-02-26 LAB — URINALYSIS, ROUTINE W REFLEX MICROSCOPIC
Bilirubin Urine: NEGATIVE
Glucose, UA: NEGATIVE mg/dL
Ketones, ur: NEGATIVE mg/dL
Nitrite: NEGATIVE
Protein, ur: 100 mg/dL — AB
Specific Gravity, Urine: 1.014 (ref 1.005–1.030)
pH: 7.5 (ref 5.0–8.0)

## 2015-02-26 LAB — CBC WITH DIFFERENTIAL/PLATELET
Basophils Absolute: 0 10*3/uL (ref 0.0–0.1)
Basophils Relative: 0 %
Eosinophils Absolute: 0 10*3/uL (ref 0.0–0.7)
Eosinophils Relative: 0 %
HCT: 37.4 % — ABNORMAL LOW (ref 39.0–52.0)
Hemoglobin: 11.9 g/dL — ABNORMAL LOW (ref 13.0–17.0)
Lymphocytes Relative: 12 %
Lymphs Abs: 1.2 10*3/uL (ref 0.7–4.0)
MCH: 31.2 pg (ref 26.0–34.0)
MCHC: 31.8 g/dL (ref 30.0–36.0)
MCV: 97.9 fL (ref 78.0–100.0)
Monocytes Absolute: 1 10*3/uL (ref 0.1–1.0)
Monocytes Relative: 11 %
Neutro Abs: 7.1 10*3/uL (ref 1.7–7.7)
Neutrophils Relative %: 77 %
Platelets: 169 10*3/uL (ref 150–400)
RBC: 3.82 MIL/uL — ABNORMAL LOW (ref 4.22–5.81)
RDW: 14.1 % (ref 11.5–15.5)
WBC: 9.4 10*3/uL (ref 4.0–10.5)

## 2015-02-26 LAB — URINE MICROSCOPIC-ADD ON

## 2015-02-26 LAB — MAGNESIUM: Magnesium: 1.9 mg/dL (ref 1.7–2.4)

## 2015-02-26 LAB — TSH: TSH: 1.507 u[IU]/mL (ref 0.350–4.500)

## 2015-02-26 LAB — PHOSPHORUS: Phosphorus: 2.6 mg/dL (ref 2.5–4.6)

## 2015-02-26 MED ORDER — ACETAMINOPHEN 650 MG RE SUPP: 650.0000 mg | Freq: Four times a day (QID) | RECTAL | Status: DC | PRN

## 2015-02-26 MED ORDER — SODIUM CHLORIDE 0.9 % IJ SOLN
3.0000 mL | Freq: Two times a day (BID) | INTRAMUSCULAR | Status: DC
  Administered 2015-02-26 – 2015-02-28 (×3): 3 mL via INTRAVENOUS

## 2015-02-26 MED ORDER — ATORVASTATIN CALCIUM 20 MG PO TABS
20.0000 mg | ORAL_TABLET | Freq: Every day | ORAL | Status: DC
Start: 2015-02-26 — End: 2015-03-01
  Administered 2015-02-26 – 2015-02-28 (×3): 20 mg via ORAL
  Filled 2015-02-26 (×3): qty 1

## 2015-02-26 MED ORDER — RIVAROXABAN 15 MG PO TABS
15.0000 mg | ORAL_TABLET | Freq: Every day | ORAL | Status: DC
  Administered 2015-02-26 – 2015-02-28 (×3): 15 mg via ORAL
  Filled 2015-02-26 (×3): qty 1

## 2015-02-26 MED ORDER — ACETAMINOPHEN 325 MG PO TABS: 650.0000 mg | ORAL_TABLET | Freq: Four times a day (QID) | ORAL | Status: DC | PRN

## 2015-02-26 MED ORDER — POLYETHYLENE GLYCOL 3350 17 G PO PACK
17.0000 g | PACK | Freq: Every day | ORAL | Status: DC | PRN
  Administered 2015-02-28: 17 g via ORAL
  Filled 2015-02-26: qty 1

## 2015-02-26 MED ORDER — DEXTROSE 5 % IV SOLN
1.0000 g | INTRAVENOUS | Status: DC
  Administered 2015-02-26 – 2015-02-28 (×3): 1 g via INTRAVENOUS
  Filled 2015-02-26 (×4): qty 10

## 2015-02-26 NOTE — Assessment & Plan Note (Signed)
BP Readings from Last 3 Encounters:  02/26/15 180/94  02/26/15 168/85  02/11/15 195/94    Lab Results  Component Value Date   NA 137 07/09/2014   K 3.4* 07/09/2014   CREATININE 1.17 07/09/2014    Assessment: Blood pressure control: Uncontrolled Progress toward BP goal:  No progress Comments: Patient remains poorly controlled, and was stopped on his benazepril earlier this year due to cough. He is currently on irbesartan 300mg  and metoprolol 25mg  BID. Needs more aggressive control or reassessment of his chronic A/C since sustained SBP >160s on xarelto creates a significantly increased risk of ICH.  Plan: Medications:  Will need to readdress medicines after hospitalization

## 2015-02-26 NOTE — Progress Notes (Addendum)
Patient arrived to unit. MD paged to make aware for orders to be placed. Dr. Charlynn Grimes made aware of BP 180/94. Patient denies headache or associated symptoms

## 2015-02-26 NOTE — H&P (Signed)
Date: 02/26/2015               Patient Name:  [name] MRN: [id number]  DOB: [birth year] Age / Sex: 81 y.o., male   PCP: [name], DO         Medical Service: Internal Medicine Teaching Service         Attending Physician: Dr. Oval Linsey, MD    First Contact: Dr. Charlynn Grimes Pager: [phone]  Second Contact: Dr. Randell Patient Pager: [phone]       After Hours (After 5p/  First Contact Pager: [phone]  weekends / holidays): Second Contact Pager: [phone]   Chief Complaint: [name] and Hematuria  History of Present Illness: [name] is an 81 year old gentleman with a past medical history of cerebellar stroke, HTN, DVT on xarelto, BPH s/p TURP presented to clinic today with complaints of worsening weakness and gain instability as well as hematuria. His daughter is his primary care giver and provided most of the history. She reports that over the past 4 days he is having more increased difficulty with his gait and stability. He suffered a cerebellar stroke in 2013 and has baseline gait instability but is usually able to walk on his own with the aid of a walker. Over the past 4 days he has been wheelchair bound due to instability but has had no falls. The daughter also reports he has more slurring of his speech that usual. She also reports he had an episode of hematuria this morning. He has had hematuria in the past associated with his anticoagulation and TURP done in 2015. She reports that he has had UTI in the past that have caused him to act in a similar manner. He was seen in the ED in April for a UTI. She reports she believes he has felt feverish but has not taken his temperature. The patient denies any symptoms. No dysuria, fevers or chills. He is alert and oriented to person and place.   Meds: Current Facility-Administered Medications  Medication Dose Route Frequency Provider Last Rate Last Dose  . acetaminophen (TYLENOL) tablet 650 mg  650 mg Oral Q6H PRN Milagros Loll, MD       Or  . acetaminophen (TYLENOL) suppository 650 mg  650 mg Rectal Q6H PRN Milagros Loll, MD      . atorvastatin (LIPITOR) tablet 20 mg  20 mg Oral q1800 Milagros Loll, MD      . polyethylene glycol (MIRALAX / GLYCOLAX) packet 17 g  17 g Oral Daily PRN Milagros Loll, MD      . Rivaroxaban Alveda Reasons) tablet 15 mg  15 mg Oral Q supper Milagros Loll, MD      . sodium chloride 0.9 % injection 3 mL  3 mL Intravenous Q12H Milagros Loll, MD        Allergies: Allergies as of 02/26/2015  . (No Known Allergies)   Past Medical History  Diagnosis Date  . Hypertension   . Difficulty hearing, right     BILATERAL HEARING LOSS - BEST TO TRY TO SPEAK INTO LEFT EAR  . Meningioma ([place])   . Frequent falls   . BPH (benign prostatic hypertrophy)     TURP 05/19/13  . Chronic kidney disease     CHRONIC KIDNEY DISEASE, 2  . Anemia   . Thrombocytopenia ([place])   . Stroke ([place])     Cerebellar, 2013; WALKS WITH WALKER, ABLE TO DRESS AND BATHE HIMSELF BUT FAMILY  TRIES TO PROVIDE SUPERVISION BECAUSE OF HIS HX OF FALL AND WEAKNESS LEGS, ARMS   . Incontinence of urine     SOME INCONTINENCE  . Hyperlipidemia   . DVT (deep venous thrombosis) [facility])    Past Surgical History  Procedure Laterality Date  . Tube put in ear yrs ago    . Transurethral resection of prostate N/A 05/19/2013    Procedure: TRANSURETHRAL RESECTION OF THE PROSTATE WITH GYRUS INSTRUMENTS;  Surgeon: Ailene Rud, MD;  Location: [facility];  Service: Urology;  Laterality: N/A;  . Cystoscopy N/A 06/13/2013    Procedure: CYSTOSCOPY FLEXIBLE BEDSIDE;  Surgeon: Ardis Hughs, MD;  Location: [place];  Service: Urology;  Laterality: N/A;   Family History  Problem Relation Age of Onset  . Diabetes Mother   . Heart disease Mother   . Hypertension Mother   . Heart disease Father   . Hypertension Father   . Hypertension Daughter    Social History   Social History  . Marital Status: Widowed    Spouse Name: N/A  .  Number of Children: N/A  . Years of Education: N/A   Occupational History  . Not on file.   Social History Main Topics  . Smoking status: Former Smoker    Types: Cigarettes  . Smokeless tobacco: Not on file  . Alcohol Use: No     Comment: QUIT SMOKING 50 YRS AGO  . Drug Use: No  . Sexual Activity: No   Other Topics Concern  . Not on file   Social History Narrative   Lives with daughter. Ambulates w walker. Can make basic meals (cereal). Very hard of hearing.     Review of Systems: Review of Systems  Constitutional: Negative for fever, chills, weight loss and malaise/fatigue.  Eyes: Negative for blurred vision and double vision.  Respiratory: Negative for shortness of breath.   Cardiovascular: Negative for chest pain.  Gastrointestinal: Negative for nausea, vomiting, abdominal pain, diarrhea, constipation, blood in stool and melena.  Genitourinary: Positive for hematuria. Negative for dysuria and urgency.  Musculoskeletal: Negative for myalgias.  Skin: Negative for rash.  Neurological: Negative for dizziness and headaches.   Physical Exam: Blood pressure 180/94, pulse 67, temperature 98.7 F (37.1 C), temperature source Oral, resp. rate 20, height 5\' 11"  (1.803 m), weight 169 lb 15.6 oz (77.1 kg), SpO2 97 %.  Physical Exam General: alert, thin, frail appearing elderly gentleman resting comfortably in a wheelchair in no acute distress  Head: normocephalic and atraumatic.  Eyes: vision grossly intact, pupils equal, pupils round, pupils reactive to light, no injection and anicteric.  Mouth: pharynx pink and moist, no erythema, and no exudates.  Neck: supple, full ROM, no thyromegaly, no JVD, and no carotid bruits.  Lungs: normal respiratory effort, no accessory muscle use, normal breath sounds, no crackles, and no wheezes. Heart: normal rate, regular rhythm, no murmur, no gallop, and no rub.  Abdomen: soft, non-tender, normal bowel sounds, no distention Msk: no joint  swelling, no joint warmth, and no redness over joints.  Pulses: 2+ DP/PT pulses bilaterally Extremities: No cyanosis, clubbing, edema Neurologic: alert & oriented X2, cranial nerves II-XII intact, strength 5/5 in all extremities, sensation intact to light touch Skin: warm, dry, no rashes Psych: normal mood and affect  Lab results: Basic Metabolic Panel: No results for input(s): NA, K, CL, CO2, GLUCOSE, BUN, CREATININE, CALCIUM, MG, PHOS in the last 72 hours. Liver Function Tests: No results for input(s): AST, ALT, ALKPHOS, BILITOT, PROT, ALBUMIN in  the last 72 hours. No results for input(s): LIPASE, AMYLASE in the last 72 hours. No results for input(s): AMMONIA in the last 72 hours. CBC: No results for input(s): WBC, NEUTROABS, HGB, HCT, MCV, PLT in the last 72 hours. Cardiac Enzymes: No results for input(s): CKTOTAL, CKMB, CKMBINDEX, TROPONINI in the last 72 hours. BNP: No results for input(s): PROBNP in the last 72 hours. D-Dimer: No results for input(s): DDIMER in the last 72 hours. CBG: No results for input(s): GLUCAP in the last 72 hours. Hemoglobin A1C: No results for input(s): HGBA1C in the last 72 hours. Fasting Lipid Panel: No results for input(s): CHOL, HDL, LDLCALC, TRIG, CHOLHDL, LDLDIRECT in the last 72 hours. Thyroid Function Tests: No results for input(s): TSH, T4TOTAL, FREET4, T3FREE, THYROIDAB in the last 72 hours. Anemia Panel: No results for input(s): VITAMINB12, FOLATE, FERRITIN, TIBC, IRON, RETICCTPCT in the last 72 hours. Coagulation: No results for input(s): LABPROT, INR in the last 72 hours. Urine Drug Screen: Drugs of Abuse     Component Value Date/Time   LABOPIA NONE DETECTED 12/18/2013 1904   COCAINSCRNUR NONE DETECTED 12/18/2013 1904   LABBENZ NONE DETECTED 12/18/2013 1904   AMPHETMU NONE DETECTED 12/18/2013 1904   THCU NONE DETECTED 12/18/2013 1904   LABBARB NONE DETECTED 12/18/2013 1904     Imaging results:  No results found.  Other  results: EKG: normal sinus rhythm, LVH  Assessment & Plan by Problem: Mr. Manoukian is an 81 year old male with a history of CVA and baseline gait instability presenting with 4 day history of worsening weakness and instability and hematuria.   Acute on Chronic weakness with hematuria: Patient with 4 days history of worsening of gait instability and weakness. Daughter reports some possible slurred speech from his baseline. Hx of cerebellar CVA in the past with some baseline gait instability. No recent falls. No focal deficits on neuro exam. Does report hematuria but patient denies any urinary symptoms. He is incontinent at baseline and wears diapers. He is on Xarelto for hx of DVTs. OF note, he also has an IVC filter that was placed over a year ago. Attempts to have IR remove it in the past have been unsuccessful. Will get CT head tonight. Daughter reports this is similar symptomatically to when he has had UTIs in the past.  -CT Head -BCx  -UA -CBC/CMP -TSH -NPO pending swallow eval -PT/OT  HTN: BP 168/85-180/94 on admission. Holding home dose BP medications pending head CT.  -Restart home meds if CT unremarkable  HLD: On Atorvastain 20 mg daily -continue home meds  DVT PPx: Xarelto  Code: FULL  Dispo: Disposition is deferred at this time, awaiting improvement of current medical problems.   The patient does have a current PCP [name], DO) and does need an [facility] hospital follow-up appointment after discharge.  The patient does not have transportation limitations that hinder transportation to clinic appointments.  Signed: Maryellen Pile, MD 02/26/2015, 6:42 PM

## 2015-02-26 NOTE — Assessment & Plan Note (Signed)
Assessment: New worsening weakness and instability with standing for the past 4-5 days. This is worse than his baseline status and his daughter reports concern that she is unable to safely mange him at home by herself. He also has hematuria that has occurred in the past particularly during a symptomatic UTI. He does not endorse frank systemic symptoms such as fevers, chills, shortness of breath, altered mental status.  Most likely cause is a new UTI explaining his hematuria and worsening generalized weakness. Other concerning explanations would be anemia although no sign of significant blood volume loss. New stroke or ICH also possible. He is on xarelto and although no known falls at home he has been uncontrolled hypertensive for several months with a SBP in 160s-190s on latest clinic visits.  Plan: Admit for further workup as inpatient including: CBC, Bmet, CT head, UA, urine Cx Needs PT/OT evaluation, may need therapy before returning home

## 2015-02-26 NOTE — Progress Notes (Signed)
Subjective:   Patient ID: [name] male   DOB: December 17, 1924 81 y.o.   MRN: [id number]  HPI: [name] is a 81 y.o. man with past medical history as described below who presents to clinic today for a 4 day history of worsening weakness and gait instability plus hematuria. He is cared for at home primarily by his daughter who is having increasing trouble caring for him due to weakness with standing. He has chronic gait instability secondary to a cerebellar stroke in 2013, but for the past week has been wheelchair-bound except when standing in place. He has previously fallen but not within the past year. He has an IVC filter placed almost 18 months ago and is also on Xarelto anticoagulation for his recurrent DVT and embolic strokes. He has also currently has hematuria which he has had before associated with his anticoagulation and s/p TURP in 2015.  Of note he was last seen at the emergency department for urinary tract infection in April of this year. He has not had obvious fevers, chills, or other systemic symptoms during this time. It is difficult to assess if mental status is changed from baseline since he is very hard of hearing but does appear to be alert and oriented to person and year.  Based on his somewhat unclear etiology of symptoms and his daughter believing she cannot safely care for him at home in his current condition we will admit to the hospital for further workup and treatment.  See problem based assessment and plan below for additional details.  Past Medical History  Diagnosis Date  . Hypertension   . Difficulty hearing, right     BILATERAL HEARING LOSS - BEST TO TRY TO SPEAK INTO LEFT EAR  . Meningioma ([place])   . Frequent falls   . BPH (benign prostatic hypertrophy)     TURP 05/19/13  . Chronic kidney disease     CHRONIC KIDNEY DISEASE, 2  . Anemia   . Thrombocytopenia ([place])   . Stroke ([place])     Cerebellar, 2013; WALKS WITH WALKER, ABLE TO DRESS AND BATHE  HIMSELF BUT FAMILY TRIES TO PROVIDE SUPERVISION BECAUSE OF HIS HX OF FALL AND WEAKNESS LEGS, ARMS   . Incontinence of urine     SOME INCONTINENCE  . Hyperlipidemia   . DVT (deep venous thrombosis) ([place])    Current Outpatient Prescriptions  Medication Sig Dispense Refill  . atorvastatin (LIPITOR) 20 MG tablet Take 1 tablet (20 mg total) by mouth daily at 6 PM. 90 tablet 3  . irbesartan (AVAPRO) 300 MG tablet Take 1 tablet (300 mg total) by mouth daily. 90 tablet 3  . metoprolol tartrate (LOPRESSOR) 25 MG tablet Take 1 tablet (25 mg total) by mouth 2 (two) times daily. 180 tablet 3  . Rivaroxaban (XARELTO) 15 MG TABS tablet take 1 tablet by mouth once daily WITH SUPPER 90 tablet 3   No current facility-administered medications for this visit.   Family History  Problem Relation Age of Onset  . Diabetes Mother   . Heart disease Mother   . Hypertension Mother   . Heart disease Father   . Hypertension Father   . Hypertension Daughter    Social History   Social History  . Marital Status: Widowed    Spouse Name: N/A  . Number of Children: N/A  . Years of Education: N/A   Social History Main Topics  . Smoking status: Former Smoker    Types: Cigarettes  . Smokeless  tobacco: None  . Alcohol Use: No     Comment: QUIT SMOKING 50 YRS AGO  . Drug Use: No  . Sexual Activity: No   Other Topics Concern  . None   Social History Narrative   Lives with daughter. Ambulates w walker. Can make basic meals (cereal). Very hard of hearing.    Review of Systems: Review of Systems  Constitutional: Negative for fever and chills.  Cardiovascular: Negative for chest pain and leg swelling.  Genitourinary:       Incontinent  Musculoskeletal: Positive for joint pain. Negative for falls.  Skin: Negative for rash.  Neurological: Positive for weakness. Negative for focal weakness.       Unstable standing    Objective:  Physical Exam: Filed Vitals:   02/26/15 1522  BP: 168/85  Pulse: 65    Temp: 99.1 F (37.3 C)  TempSrc: Oral  Height: 5\' 11"  (1.803 m)  Weight: 168 lb (76.204 kg)  SpO2: 100%   GENERAL- elderly man in wheelchair, hearing limited to left ear with hearing aid, co-operative, NAD HEENT- Atraumatic, PERRL, EOMI, oral mucosa appears moist, patchy white on tongue not able to be scraped, no carotid bruit, no cervical LN enlargement. CARDIAC- RRR, no murmurs, rubs or gallops. RESP- CTAB, no wheezes or crackles. ABDOMEN- Soft, nontender, no guarding or rebound NEURO- No obvious Cr N abnormality, strength upper extremities- 5/5, Sensation intact globally, no finger to nose dysmetria, normal rapid alternating hand movements EXTREMITIES- left hand with minor wasting compared to right, 5/5 strength bilaterally, no pedal edema. SKIN- Warm, dry, chronic skin changes with dry peeling over shins and feet  Assessment & Plan:

## 2015-02-27 ENCOUNTER — Observation Stay (HOSPITAL_COMMUNITY): Payer: Medicare Other

## 2015-02-27 DIAGNOSIS — M6281 Muscle weakness (generalized): Secondary | ICD-10-CM | POA: Diagnosis not present

## 2015-02-27 DIAGNOSIS — I129 Hypertensive chronic kidney disease with stage 1 through stage 4 chronic kidney disease, or unspecified chronic kidney disease: Secondary | ICD-10-CM | POA: Diagnosis present

## 2015-02-27 DIAGNOSIS — R531 Weakness: Secondary | ICD-10-CM

## 2015-02-27 DIAGNOSIS — E870 Hyperosmolality and hypernatremia: Secondary | ICD-10-CM

## 2015-02-27 DIAGNOSIS — B9620 Unspecified Escherichia coli [E. coli] as the cause of diseases classified elsewhere: Secondary | ICD-10-CM | POA: Diagnosis present

## 2015-02-27 DIAGNOSIS — G9340 Encephalopathy, unspecified: Secondary | ICD-10-CM | POA: Diagnosis present

## 2015-02-27 DIAGNOSIS — N182 Chronic kidney disease, stage 2 (mild): Secondary | ICD-10-CM | POA: Diagnosis present

## 2015-02-27 DIAGNOSIS — N401 Enlarged prostate with lower urinary tract symptoms: Secondary | ICD-10-CM | POA: Diagnosis present

## 2015-02-27 DIAGNOSIS — H9190 Unspecified hearing loss, unspecified ear: Secondary | ICD-10-CM | POA: Diagnosis present

## 2015-02-27 DIAGNOSIS — N3001 Acute cystitis with hematuria: Secondary | ICD-10-CM | POA: Diagnosis present

## 2015-02-27 DIAGNOSIS — N390 Urinary tract infection, site not specified: Secondary | ICD-10-CM | POA: Diagnosis present

## 2015-02-27 DIAGNOSIS — E785 Hyperlipidemia, unspecified: Secondary | ICD-10-CM | POA: Diagnosis present

## 2015-02-27 DIAGNOSIS — Z86718 Personal history of other venous thrombosis and embolism: Secondary | ICD-10-CM | POA: Diagnosis not present

## 2015-02-27 DIAGNOSIS — I10 Essential (primary) hypertension: Secondary | ICD-10-CM | POA: Diagnosis not present

## 2015-02-27 DIAGNOSIS — Z833 Family history of diabetes mellitus: Secondary | ICD-10-CM | POA: Diagnosis not present

## 2015-02-27 DIAGNOSIS — D62 Acute posthemorrhagic anemia: Secondary | ICD-10-CM | POA: Diagnosis present

## 2015-02-27 DIAGNOSIS — Z8673 Personal history of transient ischemic attack (TIA), and cerebral infarction without residual deficits: Secondary | ICD-10-CM | POA: Diagnosis not present

## 2015-02-27 DIAGNOSIS — Z5189 Encounter for other specified aftercare: Secondary | ICD-10-CM | POA: Diagnosis not present

## 2015-02-27 DIAGNOSIS — Z87891 Personal history of nicotine dependence: Secondary | ICD-10-CM | POA: Diagnosis not present

## 2015-02-27 DIAGNOSIS — Z8249 Family history of ischemic heart disease and other diseases of the circulatory system: Secondary | ICD-10-CM | POA: Diagnosis not present

## 2015-02-27 DIAGNOSIS — Z7901 Long term (current) use of anticoagulants: Secondary | ICD-10-CM | POA: Diagnosis not present

## 2015-02-27 LAB — CBC
HCT: 32.5 % — ABNORMAL LOW (ref 39.0–52.0)
HCT: 36.5 % — ABNORMAL LOW (ref 39.0–52.0)
Hemoglobin: 10 g/dL — ABNORMAL LOW (ref 13.0–17.0)
Hemoglobin: 11.5 g/dL — ABNORMAL LOW (ref 13.0–17.0)
MCH: 29.4 pg (ref 26.0–34.0)
MCH: 30.3 pg (ref 26.0–34.0)
MCHC: 30.8 g/dL (ref 30.0–36.0)
MCHC: 31.5 g/dL (ref 30.0–36.0)
MCV: 95.6 fL (ref 78.0–100.0)
MCV: 96.1 fL (ref 78.0–100.0)
Platelets: 159 10*3/uL (ref 150–400)
Platelets: 169 10*3/uL (ref 150–400)
RBC: 3.4 MIL/uL — ABNORMAL LOW (ref 4.22–5.81)
RBC: 3.8 MIL/uL — ABNORMAL LOW (ref 4.22–5.81)
RDW: 14.1 % (ref 11.5–15.5)
RDW: 13.9 % (ref 11.5–15.5)
WBC: 8.7 10*3/uL (ref 4.0–10.5)
WBC: 9 10*3/uL (ref 4.0–10.5)

## 2015-02-27 LAB — BASIC METABOLIC PANEL
Anion gap: 8 (ref 5–15)
Anion gap: 6 (ref 5–15)
BUN: 16 mg/dL (ref 6–20)
BUN: 17 mg/dL (ref 6–20)
CO2: 28 mmol/L (ref 22–32)
CO2: 30 mmol/L (ref 22–32)
Calcium: 9.9 mg/dL (ref 8.9–10.3)
Calcium: 9.8 mg/dL (ref 8.9–10.3)
Chloride: 112 mmol/L — ABNORMAL HIGH (ref 101–111)
Chloride: 107 mmol/L (ref 101–111)
Creatinine, Ser: 1.21 mg/dL (ref 0.61–1.24)
Creatinine, Ser: 1.22 mg/dL (ref 0.61–1.24)
GFR calc Af Amer: 60 mL/min — ABNORMAL LOW (ref >60)
GFR calc Af Amer: 59 mL/min — ABNORMAL LOW (ref >60)
GFR calc non Af Amer: 52 mL/min — ABNORMAL LOW (ref >60)
GFR calc non Af Amer: 51 mL/min — ABNORMAL LOW (ref >60)
Glucose, Bld: 108 mg/dL — ABNORMAL HIGH (ref 65–99)
Glucose, Bld: 118 mg/dL — ABNORMAL HIGH (ref 65–99)
Potassium: 3.4 mmol/L — ABNORMAL LOW (ref 3.5–5.1)
Potassium: 3.7 mmol/L (ref 3.5–5.1)
Sodium: 148 mmol/L — ABNORMAL HIGH (ref 135–145)
Sodium: 143 mmol/L (ref 135–145)

## 2015-02-27 LAB — GLUCOSE, CAPILLARY: Glucose-Capillary: 107 mg/dL — ABNORMAL HIGH (ref 65–99)

## 2015-02-27 MED ORDER — POTASSIUM CHLORIDE CRYS ER 20 MEQ PO TBCR
40.0000 meq | EXTENDED_RELEASE_TABLET | Freq: Two times a day (BID) | ORAL | Status: DC
  Administered 2015-02-27 – 2015-03-01 (×5): 40 meq via ORAL
  Filled 2015-02-27 (×5): qty 2

## 2015-02-27 MED ORDER — DEXTROSE 5 % IV SOLN
INTRAVENOUS | Status: AC
  Administered 2015-02-27 – 2015-02-28 (×2): via INTRAVENOUS

## 2015-02-27 MED ORDER — METOPROLOL TARTRATE 25 MG PO TABS
25.0000 mg | ORAL_TABLET | Freq: Two times a day (BID) | ORAL | Status: DC
  Administered 2015-02-27 – 2015-03-01 (×5): 25 mg via ORAL
  Filled 2015-02-27 (×5): qty 1

## 2015-02-27 MED ORDER — AMLODIPINE BESYLATE 5 MG PO TABS
5.0000 mg | ORAL_TABLET | Freq: Every day | ORAL | Status: DC
  Administered 2015-02-27 – 2015-03-01 (×3): 5 mg via ORAL
  Filled 2015-02-27 (×3): qty 1

## 2015-02-27 NOTE — H&P (Signed)
Internal Medicine Attending Admission Note Date: 02/27/2015  Patient name: [name] Medical record number: [id number] Date of birth: [birth year] Age: 81 y.o. Gender: male  I saw and evaluated the patient. I reviewed the resident's note and I agree with the resident's findings and plan as documented in the resident's note.  Chief Complaint(s): Generalized weakness and instability with ambulation and hematuria.  History - key components related to admission:  [name] is an 81 year old man with a history of a prior cerebellar stroke, recurrent deep venous thromboses, hypertension, and benign prosthetic hypertrophy requiring a transurethral resection of the prostate complicated by recurrent UTIs who presents with a four-day history of increasing generalized weakness, gait instability, and hematuria as reported by his caregiver, his daughter. He has gait instability ever since his cerebellar stroke, but is able to ambulate with the assistance of a walker. Over the last 4 days he has been unable to ambulate with a walker and has been essentially wheelchair-bound. His daughter also noted more slurring of his speech than baseline. She felt he was warm to the touch but did not take a temperature. He had a similar presentation in the past after developing a urinary tract infection. The patient specifically denied dysuria, fevers, chills, or lower abdominal pain. He is without any complaints.  Physical Exam - key components related to admission:  Filed Vitals:   02/26/15 2013 02/27/15 0033 02/27/15 0432 02/27/15 0436  BP: 166/77 164/95  139/82  Pulse: 78 74  79  Temp: 98.8 F (37.1 C) 99.8 F (37.7 C)  98.6 F (37 C)  TempSrc: Oral Oral  Oral  Resp: 18 18  18   Height:      Weight:   166 lb 3.6 oz (75.4 kg)   SpO2: 98% 99%  97%   Gen.: Elderly man lying comfortably in bed in no acute distress. He is hard of hearing but will answer questions appropriately. He is alert and oriented to person  and place but not to time. Neuro: Moves all 4 extremities spontaneously., Follows commands, sensation grossly intact. Abdomen: Soft, nontender, active bowel sounds. Extremities: Without edema.  Lab results:  Basic Metabolic Panel:  Recent Labs  02/26/15 1912 02/27/15 0558  NA 148* 148*  K 3.9 3.4*  CL 114* 112*  CO2 26 28  GLUCOSE 119* 108*  BUN 18 16  CREATININE 1.26* 1.21  CALCIUM 10.3 9.9  MG 1.9  --   PHOS 2.6  --    Liver Function Tests:  Recent Labs  02/26/15 1912  AST 28  ALT 19  ALKPHOS 51  BILITOT 1.1  PROT 6.9  ALBUMIN 3.0*   CBC:  Recent Labs  02/26/15 1912 02/27/15 0558  WBC 9.4 8.7  NEUTROABS 7.1  --   HGB 11.9* 10.0*  HCT 37.4* 32.5*  MCV 97.9 95.6  PLT 169 159   CBG:  Recent Labs  02/27/15 0756  GLUCAP 107*   Thyroid Function Tests:  Recent Labs  02/26/15 1912  TSH 1.507   Urinalysis:  Turbid yellow, specific gravity 1.014, pH 7.5, large hemoglobin, 100 protein, nitrite negative, leukocytes large, 6-30 red blood cells per high-power field, too numerous to count white blood cells per high-power field, many bacteria.  Misc. Labs:  Blood culture 2 pending Urine culture pending  Imaging results:  Ct Head Wo Contrast  02/27/2015  CLINICAL DATA:  Initial evaluation for worsening weakness and instability with staining for past 4-5 days. EXAM: CT HEAD WITHOUT CONTRAST TECHNIQUE: Contiguous axial images were  obtained from the base of the skull through the vertex without intravenous contrast. COMPARISON:  Prior CT from 07/09/2014. FINDINGS: Moderate diffuse cerebral atrophy is present, overall not significantly changed relative to previous exam. Extensive hypodensity within the periventricular and deep white matter of both cerebral hemispheres most consistent with chronic small vessel ischemic disease, moderate to advanced in nature. Remote lacunar infarct present within the right thalamus. Additional large remote right cerebellar  infarct, with smaller left cerebellar infarcts. No evidence for new acute large vessel territory infarct. No acute intracranial hemorrhage. Partially calcified meningioma along the left sphenoid wing is not significantly changed measuring 2.5 x 2.6 cm on today's study. No significant edema. No other mass lesion. Ventricular prominence related to global parenchymal volume loss present without hydrocephalus. No extra-axial fluid collection. Scalp soft tissues within normal limits. No acute abnormality about the orbits. Paranasal sinuses are clear. Remote defect present at the left lamina papyracea. No significant mastoid effusion. Calvarium intact. IMPRESSION: 1. No acute intracranial process. 2. Stable left sphenoid wing meningioma without associated edema. 3. Advanced age-related cerebral atrophy with chronic small vessel ischemic disease and multiple remote infarcts as above, stable. Electronically Signed   By: Jeannine Boga M.D.   On: 02/27/2015 01:06   Other results:  EKG: Normal sinus rhythm at 70 bpm, left axis deviation, left anterior hemiblock, normal intervals, no significant Q waves, LVH by voltage, good R wave progression, nonspecific ST-T changes. No significant change from the previous ECG on 07/09/2014.  Assessment & Plan by Problem:  [name] is an 81 year old man with a history of prior cerebellar stroke, recurrent deep venous thromboses on chronic anticoagulation, hypertension, and benign prosthetic hypertrophy requiring transurethral resection of the prostate and complicated by recurrent urinary tract infections who presents with generalized weakness, gait instability, mild slurring of speech, hematuria, and a urinary tract infection. Neurologic examination appears to be at baseline and a head CT scan was negative for an acute bleed. A previous episode associated with an acute urinary tract infection was similar to this presentation. It is likely that his nonspecific symptoms are  associated with his urinary tract infection. 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 Although he is euvolemic on examination there is likely some component of the free water deficit contributing to his overall generalized weakness. I do not believe this represents a new stroke given the examination and other factors associated with his presentation.  1) Urinary tract infection: He is currently receiving IV ceftriaxone pending the results of the urine and blood cultures. If he returns to baseline with regards to mental status and strength we will consider switching him over to an oral antibiotic as guided by the results of the urine culture and sensitivities. At this point, starting an alpha blocker may not provide more benefits than risks given his gait instability at baseline and current generalized weakness. This can be reassessed as an outpatient.  2) Free water deficit: His free water deficit is 2.06 L. We will replace it over the next 24-48 hours. We will also make sure he has ready access to water. We will follow his basic metabolic panel, specifically his sodium, each morning until corrected.  3) IVC filter: While he is in inpatient, we will ask interventional radiology to consider removal of his IVC filter. Coordination to have this done as  an outpatient has been difficult and this may be an opportunity to remove this foreign body that is no longer clinically indicated. Obviously, this will not be done over the weekend but we can consult interventional radiology about this issue on Monday as I expect the patient to be here at that time.  4) Disposition: We will ask his daughter for her opinion as to the improvement in his symptoms towards baseline and whether or not she would be able to care for him at home. We will be  getting physical therapy and occupational therapy to assess him and his needs as well. I anticipate he will be here through the weekend and these assessments will be completed on Monday where further decisions can be made as to his ultimate placement or return home.

## 2015-02-27 NOTE — Progress Notes (Signed)
I was just paged by nursing staff informing me patient has gross foul-smelling hematuria. The reason for patient's current hospital admission is acute on chronic weakness and hematuria. Patient has a history of chronic hematuria. As per H&P documentation, he has had hematuria in the past associated with his anticoagulation and TURP done in 2015. He is currently on Xarelto for anticoagulation (hx of DVTs).  I saw and evaluated the patient. He was sitting comfortably in bed eating dinner. Patient denied having any pain in his abdomen or genital region. He denied having any pain anywhere else. Nursing staff had already emptied the patient's urine bag and as such I did not have the opportunity to look at his urine. Visual inspection of external (condom) catheter showed pink appearing urine.  Patient's Hgb was 10.0 this morning. BP is stable. I have ordered a STAT CBC to make sure his Hgb is stable. The night team has been informed and they will be following-up on the CBC results.

## 2015-02-27 NOTE — Progress Notes (Signed)
Physical Therapy Treatment Patient Details Name: [name] MRN: [id number] DOB: 1924/05/29 Today's Date: 02/27/2015    History of Present Illness Pt adm with gait instability and weakness. PMH - cerebellar CVA, HTN, DVT's, IVC filter, lt CVA.    PT Comments    Pt with less posterior lean using Stedy.   Follow Up Recommendations  SNF (unless family can provide the level of physical assist neede)     Equipment Recommendations  None recommended by PT    Recommendations for Other Services       Precautions / Restrictions Precautions Precautions: Fall    Mobility  Bed Mobility Overal bed mobility: Needs Assistance Bed Mobility: Sit to Supine     Supine to sit: Mod assist Sit to supine: +2 for physical assistance;Max assist   General bed mobility comments: Assist to lower trunk and to bring legs up into bed.  Transfers Overall transfer level: Needs assistance Equipment used: Ambulation equipment used Transfers: Sit to/from [name] Sit to Stand: +2 physical assistance;Mod assist Stand pivot transfers: +2 physical assistance;Mod assist       General transfer comment: Assist to bring hips up. Pt with much less posterior lean using the Stedy.  Ambulation/Gait Ambulation/Gait assistance: +2 physical assistance;Mod assist Ambulation Distance (Feet): 3 Feet Assistive device: Rolling walker (2 wheeled) Gait Pattern/deviations: Step-to pattern;Decreased step length - right;Decreased step length - left;Shuffle;Leaning posteriorly   Gait velocity interpretation: <1.8 ft/sec, indicative of risk for recurrent falls General Gait Details: Heavy assist due to posterior and rt lateral lean.   Stairs            Wheelchair Mobility    Modified Rankin (Stroke Patients Only)       Balance Overall balance assessment: Needs assistance Sitting-balance support: Bilateral upper extremity supported;Feet supported Sitting balance-Leahy Scale:  Poor Sitting balance - Comments: Sat EOB x 5 minutes with min to mod A Postural control: Posterior lean;Right lateral lean Standing balance support: Bilateral upper extremity supported Standing balance-Leahy Scale: Poor Standing balance comment: Stedy and min A for static standing                    Cognition Arousal/Alertness: Awake/alert Behavior During Therapy: WFL for tasks assessed/performed Overall Cognitive Status: No family/caregiver present to determine baseline cognitive functioning Area of Impairment: Orientation;Following commands;Problem solving Orientation Level: Disoriented to;Time;Situation   Memory: Decreased short-term memory Following Commands: Follows one step commands consistently     Problem Solving: Requires verbal cues;Requires tactile cues      Exercises      General Comments        Pertinent Vitals/Pain Pain Assessment: No/denies pain    Home Living Family/patient expects to be discharged to:: Private residence Living Arrangements: Children Available Help at Discharge: Family;Available 24 hours/day Type of Home: House Home Access: Ramped entrance   Home Layout: One level Home Equipment: Walker - 2 wheels;Bedside commode;Wheelchair - manual Additional Comments: some information from prior encounter    Prior Function Level of Independence: Needs assistance  Gait / Transfers Assistance Needed: Per H&P pt able to amb with rolling walker until 4 days ago. Unsure if was requiring assist with ambulation       PT Goals (current goals can now be found in the care plan section) Acute Rehab PT Goals Patient Stated Goal: Not stated PT Goal Formulation: With patient Time For Goal Achievement: 03/06/15 Potential to Achieve Goals: Good Progress towards PT goals: Progressing toward goals    Frequency  Min 3X/week  PT Plan Current plan remains appropriate    Co-evaluation             End of Session Equipment Utilized During  Treatment: Gait belt;Other (comment) Charlaine Dalton) Activity Tolerance: Patient limited by fatigue Patient left: in chair;with call bell/phone within reach;with chair alarm set     Time: [phone] PT Time Calculation (min) (ACUTE ONLY): 10 min  Charges:  $Therapeutic Activity: 8-22 mins                    G Codes:      Jordon Kristiansen 03-27-2015, 3:34 PM [facility] PT [phone]

## 2015-02-27 NOTE — Progress Notes (Signed)
   Subjective: Patient with no complaints this morning.   Objective: Vital signs in last 24 hours: Filed Vitals:   02/27/15 0033 02/27/15 0432 02/27/15 0436 02/27/15 1213  BP: 164/95  139/82 170/97  Pulse: 74  79 66  Temp: 99.8 F (37.7 C)  98.6 F (37 C)   TempSrc: Oral  Oral   Resp: 18  18 23   Height:      Weight:  166 lb 3.6 oz (75.4 kg)    SpO2: 99%  97% 100%   Weight change:   Intake/Output Summary (Last 24 hours) at 02/27/15 1243 Last data filed at 02/27/15 1201  Gross per 24 hour  Intake    533 ml  Output    525 ml  Net      8 ml    Physical Exam GENERAL- frail appearing elderly gentleman lying in bed. Heard of hearing. Alert and orient to person and place but not time. HEENT- Atraumatic, normocephalic CARDIAC- RRR, no murmurs, rubs or gallops. RESP- Moving equal volumes of air, and clear to auscultation bilaterally, no wheezes or crackles. ABDOMEN- Soft, nontender, bowel sounds present. NEURO- No obvious Cr N abnormality. AAOx2 EXTREMITIES- pulse 2+, symmetric, no pedal edema. SKIN- Warm, dry, No rash or lesion. PSYCH- Normal mood and affect  Medications: I have reviewed the patient's current medications. Scheduled Meds: . atorvastatin  20 mg Oral q1800  . cefTRIAXone (ROCEPHIN)  IV  1 g Intravenous Q24H  . metoprolol tartrate  25 mg Oral BID  . potassium chloride  40 mEq Oral BID  . Rivaroxaban  15 mg Oral Q supper  . sodium chloride  3 mL Intravenous Q12H   Continuous Infusions: . dextrose 50 mL/hr at 02/27/15 0941   PRN Meds:.acetaminophen **OR** acetaminophen, polyethylene glycol Assessment/Plan: Urinary Tract Infection: UA positive for UTI. Urine and blood cultures pending. He was started on Ceftriaxone. CT head and TSH are both unremarkable. Do not believe his symptoms are related to a new CVA. His neurologic exam shows no focal deficits. He is alert and oriented x 2. Will deescalate ABX pending cultures.  -Continue Ceftriaxone  -IVF -PT/OT for  eval.  Hypernatremia with free water deficit: Na 148 this morning. 2.06 L free water deficit. Will start D5W to replace and make sure he has access to water. -Repeat BMP this afternoon -D5W 135mL/hr for 20 hours  HTN: BP remains elevated. 170/92 today.  -Restart metoprolol 25 mg bid -Start amlodipine 5 mg daily  HLD: On Atorvastain 20 mg daily -continue home meds  IVF filter: Patient with IVF filter placed > year ago. Have had difficulties getting it removed as an outpatient. Will ask IR to evaluate if it is possible to have it removed while inpatient.   DVT PPx: Xarelto  Dispo: Disposition is deferred at this time, awaiting improvement of current medical problems.   The patient does have a current PCP [name], DO) and does need an [facility] hospital follow-up appointment after discharge.  The patient does not have transportation limitations that hinder transportation to clinic appointments.  .Services Needed at time of discharge: Y = Yes, Blank = No PT:   OT:   RN:   Equipment:   Other:     LOS: 1 day   Maryellen Pile, MD IMTS PGY-1 [phone] 02/27/2015, 12:43 PM

## 2015-02-27 NOTE — Evaluation (Signed)
Physical Therapy Evaluation Patient Details Name: [name] MRN: [id number] DOB: 01-Jul-1924 Today's Date: 02/27/2015   History of Present Illness  Pt adm with gait instability and weakness. PMH - cerebellar CVA, HTN, DVT's, IVC filter, lt CVA.  Clinical Impression  Pt admitted with above diagnosis and presents to PT with functional limitations due to deficits listed below (See PT problem list). Pt needs skilled PT to maximize independence and safety to allow discharge to ST-SNF unless family able to provide the level of physical assist pt is requiring. If the family is willing and able to provide then pt could receive HHPT.    Follow Up Recommendations SNF (unless family can provide the level of physical assist neede)    Equipment Recommendations  None recommended by PT    Recommendations for Other Services       Precautions / Restrictions Precautions Precautions: Fall      Mobility  Bed Mobility Overal bed mobility: Needs Assistance Bed Mobility: Supine to Sit     Supine to sit: Mod assist     General bed mobility comments: Assist to bring legs off bed, elevate trunk into sitting, and bring hips to EOB.  Transfers Overall transfer level: Needs assistance Equipment used: Rolling walker (2 wheeled) Transfers: Sit to/from Stand Sit to Stand: +2 physical assistance;Mod assist         General transfer comment: Assist to bring hips up and to shift wt anteriorly  Ambulation/Gait Ambulation/Gait assistance: +2 physical assistance;Mod assist Ambulation Distance (Feet): 3 Feet Assistive device: Rolling walker (2 wheeled) Gait Pattern/deviations: Step-to pattern;Decreased step length - right;Decreased step length - left;Shuffle;Leaning posteriorly   Gait velocity interpretation: <1.8 ft/sec, indicative of risk for recurrent falls General Gait Details: Heavy assist due to posterior and rt lateral lean.  Stairs            Wheelchair Mobility    Modified  Rankin (Stroke Patients Only)       Balance Overall balance assessment: Needs assistance Sitting-balance support: Bilateral upper extremity supported;Feet supported Sitting balance-Leahy Scale: Poor Sitting balance - Comments: Sat EOB x 5 minutes with min to mod A Postural control: Posterior lean;Right lateral lean Standing balance support: Bilateral upper extremity supported Standing balance-Leahy Scale: Poor Standing balance comment: Walker and +2 mod a for static standing with posterior and rt lateral lean                             Pertinent Vitals/Pain Pain Assessment: No/denies pain    Home Living Family/patient expects to be discharged to:: Private residence Living Arrangements: Children Available Help at Discharge: Family;Available 24 hours/day Type of Home: House Home Access: Ramped entrance     Home Layout: One level Home Equipment: Walker - 2 wheels;Bedside commode;Wheelchair - manual Additional Comments: some information from prior encounter    Prior Function Level of Independence: Needs assistance   Gait / Transfers Assistance Needed: Per H&P pt able to amb with rolling walker until 4 days ago. Unsure if was requiring assist with ambulation           Hand Dominance   Dominant Hand: Right    Extremity/Trunk Assessment   Upper Extremity Assessment: Defer to OT evaluation           Lower Extremity Assessment: Generalized weakness         Communication   Communication: HOH  Cognition Arousal/Alertness: Awake/alert Behavior During Therapy: WFL for tasks assessed/performed Overall Cognitive Status: No  family/caregiver present to determine baseline cognitive functioning Area of Impairment: Orientation;Following commands;Problem solving Orientation Level: Disoriented to;Time;Situation   Memory: Decreased short-term memory Following Commands: Follows one step commands consistently     Problem Solving: Requires verbal cues;Requires  tactile cues      General Comments      Exercises        Assessment/Plan    PT Assessment Patient needs continued PT services  PT Diagnosis Difficulty walking;Generalized weakness   PT Problem List Decreased strength;Decreased activity tolerance;Decreased balance;Decreased mobility;Decreased knowledge of use of DME;Decreased cognition  PT Treatment Interventions DME instruction;Gait training;Functional mobility training;Therapeutic activities;Therapeutic exercise;Balance training;Patient/family education;Cognitive remediation   PT Goals (Current goals can be found in the Care Plan section) Acute Rehab PT Goals Patient Stated Goal: Not stated PT Goal Formulation: With patient Time For Goal Achievement: 03/06/15 Potential to Achieve Goals: Good    Frequency Min 3X/week   Barriers to discharge        Co-evaluation               End of Session Equipment Utilized During Treatment: Gait belt Activity Tolerance: Patient limited by fatigue Patient left: in chair;with call bell/phone within reach;with chair alarm set Nurse Communication: Mobility status;Need for lift equipment (recommend Stedy for back to bed)         Time: [id number] PT Time Calculation (min) (ACUTE ONLY): 16 min   Charges:   PT Evaluation $Initial PT Evaluation Tier I: 1 Procedure     PT G Codes:        Vernella Niznik 03-13-15, 1:51 PM [facility] PT [phone]

## 2015-02-27 NOTE — Progress Notes (Addendum)
Patient has severe red/bloody urine that has a bad smell.  Patient has an external urinary catheter.     Paged [facility] resident about my concern.  Ordering CBC

## 2015-02-28 DIAGNOSIS — B9620 Unspecified Escherichia coli [E. coli] as the cause of diseases classified elsewhere: Secondary | ICD-10-CM | POA: Insufficient documentation

## 2015-02-28 DIAGNOSIS — N390 Urinary tract infection, site not specified: Secondary | ICD-10-CM

## 2015-02-28 LAB — CBC
HCT: 31 % — ABNORMAL LOW (ref 39.0–52.0)
Hemoglobin: 10.1 g/dL — ABNORMAL LOW (ref 13.0–17.0)
MCH: 30.6 pg (ref 26.0–34.0)
MCHC: 32.6 g/dL (ref 30.0–36.0)
MCV: 93.9 fL (ref 78.0–100.0)
Platelets: 157 10*3/uL (ref 150–400)
RBC: 3.3 MIL/uL — ABNORMAL LOW (ref 4.22–5.81)
RDW: 13.6 % (ref 11.5–15.5)
WBC: 8.5 10*3/uL (ref 4.0–10.5)

## 2015-02-28 LAB — BASIC METABOLIC PANEL
Anion gap: 6 (ref 5–15)
BUN: 13 mg/dL (ref 6–20)
CO2: 27 mmol/L (ref 22–32)
Calcium: 9.4 mg/dL (ref 8.9–10.3)
Chloride: 106 mmol/L (ref 101–111)
Creatinine, Ser: 1.16 mg/dL (ref 0.61–1.24)
GFR calc Af Amer: >60 mL/min (ref >60)
GFR calc non Af Amer: 54 mL/min — ABNORMAL LOW (ref >60)
Glucose, Bld: 110 mg/dL — ABNORMAL HIGH (ref 65–99)
Potassium: 3.6 mmol/L (ref 3.5–5.1)
Sodium: 139 mmol/L (ref 135–145)

## 2015-02-28 MED ORDER — IRBESARTAN 300 MG PO TABS
150.0000 mg | ORAL_TABLET | Freq: Every day | ORAL | Status: DC
  Administered 2015-02-28 – 2015-03-01 (×2): 150 mg via ORAL
  Filled 2015-02-28 (×2): qty 1

## 2015-02-28 NOTE — Progress Notes (Signed)
CRITICAL VALUE ALERT  Critical value received: +blood cultures, Gram positive clusters and cocci in aerobic bottle  Date of notification:  02/28/15  Time of notification:  [id number]  Critical value read back:Yes.    Nurse who received alert:  Amaryllis Dyke  MD notified (1st page):  Internal medicine   Time of first page:  0429  MD notified (2nd page):  Time of second page:  Responding MD:  Benjamine Mola, MD   Time MD responded:  [phone]

## 2015-02-28 NOTE — Progress Notes (Signed)
Internal Medicine Attending  Date: 02/28/2015  Patient name: [name] Medical record number: [id number] Date of birth: [birth year] Age: 81 y.o. Gender: male  I saw and evaluated the patient. I reviewed the resident's note by Dr. Randell Patient and I agree with the resident's findings and plans as documented in her progress note.  With treatment of [name] Escherichia coli urinary tract infection and mild hypernatremia with replacement of his free water deficit his mental status has perked up. When seen on rounds this evening he was happy, conversant, and up to speed with the score of the football game he was watching. He was without any complaints. Nonetheless, on examination he still had gross hematuria from his condom cath. His hematocrit has fallen from 36.5 to 31 overnight. We have continued his anticoagulation because of his extensive venous thromboembolic disease and prior thrombotic CVA on the problem list. If his hemoglobin continues to drop we may need to hold anticoagulation briefly, and even consider urologic evaluation with possible cystoscopy. At this point the main issue is figuring out placement at discharge, whether  it be in a skilled nursing facility or if his daughter is able to meet his needs at home. This will be sorted out over the next 24 hours.

## 2015-02-28 NOTE — Progress Notes (Signed)
Subjective: No acute events overnight. He denies pain in general and does not have abdominal or back pain. Denies dyspnea. Tolerating PO intake without nausea or vomiting.  Objective: Vital signs in last 24 hours: Filed Vitals:   02/27/15 1213 02/27/15 2201 02/28/15 0147 02/28/15 0508  BP: 170/97 140/77 157/85 145/89  Pulse: 66 72 73 64  Temp:  98.2 F (36.8 C) 98.8 F (37.1 C) 98.4 F (36.9 C)  TempSrc:  Oral Oral Oral  Resp: 23 16 22 18   Height:      Weight:    167 lb 12.3 oz (76.1 kg)  SpO2: 100% 95% 96% 96%   Weight change: -2 lb 3.3 oz (-1 kg)  Intake/Output Summary (Last 24 hours) at 02/28/15 0845 Last data filed at 02/28/15 0525  Gross per 24 hour  Intake   2133 ml  Output   1250 ml  Net    883 ml   General Apperance: NAD HEENT: Normocephalic, atraumatic, PERRL, EOMI, anicteric sclera Neck: Supple, trachea midline Lungs: Clear to auscultation bilaterally. No wheezes, rhonchi or rales. Breathing comfortably Heart: Regular rate and rhythm, no murmur/rub/gallop Abdomen: Soft, nontender, nondistended, no rebound/guarding Extremities: Normal, atraumatic, warm and well perfused, no edema Pulses: 2+ throughout Skin: No rashes or lesions Neurologic: Hard of hearing, alert and oriented x 2 (not oriented to time). No focal deficits.  Lab Results: Basic Metabolic Panel:  Recent Labs Lab 02/26/15 1912  02/27/15 1810 02/28/15 0617  NA 148*  < > 143 139  K 3.9  < > 3.7 3.6  CL 114*  < > 107 106  CO2 26  < > 30 27  GLUCOSE 119*  < > 118* 110*  BUN 18  < > 17 13  CREATININE 1.26*  < > 1.22 1.16  CALCIUM 10.3  < > 9.8 9.4  MG 1.9  --   --   --   PHOS 2.6  --   --   --   < > = values in this interval not displayed.  Liver Function Tests:  Recent Labs Lab 02/26/15 1912  AST 28  ALT 19  ALKPHOS 51  BILITOT 1.1  PROT 6.9  ALBUMIN 3.0*   CBC:  Recent Labs Lab 02/26/15 1912  02/27/15 1950 02/28/15 0617  WBC 9.4  < > 9.0 8.5  NEUTROABS 7.1  --   --    --   HGB 11.9*  < > 11.5* 10.1*  HCT 37.4*  < > 36.5* 31.0*  MCV 97.9  < > 96.1 93.9  PLT 169  < > 169 157  < > = values in this interval not displayed.  CBG:  Recent Labs Lab 02/27/15 0756  GLUCAP 107*   Thyroid Function Tests:  Recent Labs Lab 02/26/15 1912  TSH 1.507   Urinalysis:  Recent Labs Lab 02/26/15 1854  COLORURINE YELLOW  LABSPEC 1.014  PHURINE 7.5  GLUCOSEU NEGATIVE  HGBUR LARGE*  BILIRUBINUR NEGATIVE  KETONESUR NEGATIVE  PROTEINUR 100*  NITRITE NEGATIVE  LEUKOCYTESUR LARGE*   Micro Results: Recent Results (from the past 240 hour(s))  Culture, blood (routine x 2)     Status: None (Preliminary result)   Collection Time: 02/26/15  7:09 PM  Result Value Ref Range Status   Specimen Description BLOOD LEFT HAND  Final   Special Requests BOTTLES DRAWN AEROBIC ONLY 2.5CCS  Final   Culture NO GROWTH < 24 HOURS  Final   Report Status PENDING  Incomplete  Culture, blood (routine x 2)  Status: None (Preliminary result)   Collection Time: 02/26/15  7:12 PM  Result Value Ref Range Status   Specimen Description BLOOD LEFT HAND  Final   Special Requests BOTTLES DRAWN AEROBIC ONLY 4CCS  Final   Culture  Setup Time   Final    GRAM POSITIVE COCCI IN CLUSTERS AEROBIC BOTTLE ONLY CRITICAL RESULT CALLED TO, READ BACK BY AND VERIFIED WITH: T. BUTLER RN [phone] GREEN R CONFIRMED BY T. [place]    Culture NO GROWTH < 24 HOURS  Final   Report Status PENDING  Incomplete   Studies/Results: Ct Head Wo Contrast  02/27/2015  CLINICAL DATA:  Initial evaluation for worsening weakness and instability with staining for past 4-5 days. EXAM: CT HEAD WITHOUT CONTRAST TECHNIQUE: Contiguous axial images were obtained from the base of the skull through the vertex without intravenous contrast. COMPARISON:  Prior CT from 07/09/2014. FINDINGS: Moderate diffuse cerebral atrophy is present, overall not significantly changed relative to previous exam. Extensive hypodensity within  the periventricular and deep white matter of both cerebral hemispheres most consistent with chronic small vessel ischemic disease, moderate to advanced in nature. Remote lacunar infarct present within the right thalamus. Additional large remote right cerebellar infarct, with smaller left cerebellar infarcts. No evidence for new acute large vessel territory infarct. No acute intracranial hemorrhage. Partially calcified meningioma along the left sphenoid wing is not significantly changed measuring 2.5 x 2.6 cm on today's study. No significant edema. No other mass lesion. Ventricular prominence related to global parenchymal volume loss present without hydrocephalus. No extra-axial fluid collection. Scalp soft tissues within normal limits. No acute abnormality about the orbits. Paranasal sinuses are clear. Remote defect present at the left lamina papyracea. No significant mastoid effusion. Calvarium intact. IMPRESSION: 1. No acute intracranial process. 2. Stable left sphenoid wing meningioma without associated edema. 3. Advanced age-related cerebral atrophy with chronic small vessel ischemic disease and multiple remote infarcts as above, stable. Electronically Signed   By: Jeannine Boga M.D.   On: 02/27/2015 01:06   Medications: I have reviewed the patient's current medications. Scheduled Meds: . amLODipine  5 mg Oral Daily  . atorvastatin  20 mg Oral q1800  . cefTRIAXone (ROCEPHIN)  IV  1 g Intravenous Q24H  . metoprolol tartrate  25 mg Oral BID  . potassium chloride  40 mEq Oral BID  . Rivaroxaban  15 mg Oral Q supper  . sodium chloride  3 mL Intravenous Q12H   Continuous Infusions:  PRN Meds:.acetaminophen **OR** acetaminophen, polyethylene glycol  Assessment/Plan: 81 year old man with history of HTN, Meningioma, BPH s/p TURP, CKD2, Anemia, CVA, DVT on rivaroxaban presenting from clinic with acute on chronic weakness and hematuria.  Hematuria and acute encephalopathy secondary Urinary Tract  Infection: Acute on chronic weakness. No signs of CVA on CT head. No focal deficits on exam. History of BPH s/p TURP. He has remained afebrile with no leukocytosis. -Urine culture pending -1/2 BCx with GPC in clusters -Continue IV ceftriaxone -PT/OT recommending SNF  Hypernatremia, Free water deficit: Sodium 139 this morning. Resolved. D5W discontinued.  Acute on chronic anemia, acute blood loss anemia: Acute blood loss due to hematuria. Baseline hemoglobin around 11-12. Hgb on presentation 11.9. 10.1 today. -Continue to monitor.  Hypertension: BP 140/77-170/97. -Continue home metoprolol tartrate 25mg  BID -Continue amlodpine 5mg  daily -Irbesartan held initially due to mild increase in creatinine above baseline. Restarted irbesartan at 150mg  daily today.  DVT on rivaroxaban, IVC filter: Patient with IVF filter placed > year ago. IR aware of request  for IVC filter and recommend leaving filter in place at this time given patient's age and that it has been there for over a year. -Continue rivaroxaban  HLD: Continue home Atorvastain 20 mg daily  VTE PPx: Xarelto   Dispo: Disposition is deferred at this time, awaiting improvement of current medical problems.  Anticipated discharge in approximately 1-2 day(s).   The patient does have a current PCP [name], DO) and does need an [facility] hospital follow-up appointment after discharge.  The patient does not have transportation limitations that hinder transportation to clinic appointments.  .Services Needed at time of discharge: Y = Yes, Blank = No PT:   OT:   RN:   Equipment:   Other:     LOS: 2 days   Milagros Loll, MD 02/28/2015, 8:45 AM

## 2015-02-28 NOTE — Evaluation (Signed)
Occupational Therapy Evaluation Patient Details Name: [name] MRN: [id number] DOB: 01/21/1925 Today's Date: 02/28/2015    History of Present Illness Pt adm with gait instability and weakness. PMH - cerebellar CVA, HTN, DVT's, IVC filter, lt CVA.   Clinical Impression   Pt admitted with above. He demonstrates the below listed deficits and will benefit from continued OT to maximize safety and independence with BADLs.  Pt presents to OT with generalized weakness, impaired balance, and impaired cognition.  He requires mod A, overall for ADLs.  Recommend SNF level rehab at discharge.       Follow Up Recommendations  SNF;Supervision/Assistance - 24 hour    Equipment Recommendations  None recommended by OT    Recommendations for Other Services       Precautions / Restrictions Precautions Precautions: Fall Restrictions Weight Bearing Restrictions: No      Mobility Bed Mobility Overal bed mobility: Needs Assistance Bed Mobility: Supine to Sit     Supine to sit: Mod assist     General bed mobility comments: assist to lift trunk and scoot hips to EOB   Transfers Overall transfer level: Needs assistance   Transfers: Sit to/from Stand;Stand Pivot Transfers Sit to Stand: Min assist Stand pivot transfers: Min assist       General transfer comment: Min A sit <> stand using stedy     Balance Overall balance assessment: Needs assistance Sitting-balance support: Feet supported Sitting balance-Leahy Scale: Poor Sitting balance - Comments: min A - posterior lean    Standing balance support: Bilateral upper extremity supported Standing balance-Leahy Scale: Poor Standing balance comment: min a                            ADL Overall ADL's : Needs assistance/impaired Eating/Feeding: Independent;Sitting   Grooming: Wash/dry hands;Wash/dry face;Oral care;Brushing hair;Set up;Sitting   Upper Body Bathing: Minimal assitance;Sitting   Lower Body Bathing:  Moderate assistance;Sit to/from stand   Upper Body Dressing : Moderate assistance;Sitting   Lower Body Dressing: Maximal assistance;Sit to/from stand   Toilet Transfer: Minimal assistance;Stand-pivot;BSC (using Stedy )   Toileting- Clothing Manipulation and Hygiene: Maximal assistance;Sit to/from stand       Functional mobility during ADLs: Minimal assistance (using [place] )       Vision     Perception     Praxis      Pertinent Vitals/Pain Pain Assessment: No/denies pain     Hand Dominance Right   Extremity/Trunk Assessment Upper Extremity Assessment Upper Extremity Assessment: RUE deficits/detail;LUE deficits/detail RUE Deficits / Details: shoulder ROM limited to ~90* actively.  wasting of anatomical snuff box  LUE Deficits / Details: grossly 4-/5.  Wasting of anatomical snuff box    Lower Extremity Assessment Lower Extremity Assessment: Generalized weakness   Cervical / Trunk Assessment Cervical / Trunk Assessment: Kyphotic   Communication Communication Communication: HOH   Cognition Arousal/Alertness: Awake/alert Behavior During Therapy: WFL for tasks assessed/performed Overall Cognitive Status: No family/caregiver present to determine baseline cognitive functioning Area of Impairment: Attention;Orientation;Following commands;Safety/judgement;Awareness;Problem solving Orientation Level: Place;Time Current Attention Level: Sustained Memory: Decreased short-term memory Following Commands: Follows one step commands consistently     Problem Solving: Slow processing;Difficulty sequencing;Requires verbal cues     General Comments       Exercises       Shoulder Instructions      Home Living Family/patient expects to be discharged to:: Skilled nursing facility Living Arrangements: Children Available Help at Discharge: Family;Available 24 hours/day  Type of Home: House Home Access: Ramped entrance     Home Layout: One level     Bathroom Shower/Tub:  Teacher, early years/pre: Standard Bathroom Accessibility: Yes   Home Equipment: Environmental consultant - 2 wheels;Bedside commode;Wheelchair - manual   Additional Comments: some information from prior encounter      Prior Functioning/Environment Level of Independence: Needs assistance  Gait / Transfers Assistance Needed: Pt reports he was ambulating without AD  ADL's / Homemaking Assistance Needed: Pt reports he was mod I with ADLs.   Comments: No family present to confirm accuracy of information     OT Diagnosis: Generalized weakness;Cognitive deficits   OT Problem List: Decreased strength;Decreased activity tolerance;Impaired balance (sitting and/or standing);Decreased cognition;Decreased safety awareness;Decreased knowledge of use of DME or AE   OT Treatment/Interventions: Self-care/ADL training;Therapeutic exercise;DME and/or AE instruction;Therapeutic activities;Cognitive remediation/compensation;Balance training;Patient/family education    OT Goals(Current goals can be found in the care plan section) Acute Rehab OT Goals Patient Stated Goal: Did not state  OT Goal Formulation: With patient Time For Goal Achievement: 03/14/15 Potential to Achieve Goals: Good ADL Goals Pt Will Perform Grooming: with min assist;standing Pt Will Perform Upper Body Bathing: with set-up;sitting Pt Will Perform Lower Body Bathing: with min assist;sit to/from stand Pt Will Transfer to Toilet: with min assist;stand pivot transfer;bedside commode Pt Will Perform Toileting - Clothing Manipulation and hygiene: with min assist;sit to/from stand  OT Frequency: Min 2X/week   Barriers to D/C: Decreased caregiver support          Co-evaluation              End of Session Equipment Utilized During Treatment: Other (comment) (STEDY ) Nurse Communication: Mobility status  Activity Tolerance: Patient tolerated treatment well Patient left: in chair;with call bell/phone within reach;with bed alarm set    Time: 1020-1037 OT Time Calculation (min): 17 min Charges:  OT General Charges $OT Visit: 1 Procedure OT Evaluation $Initial OT Evaluation Tier I: 1 Procedure G-Codes:    Lucille Passy M 2015-03-02, 12:25 PM

## 2015-02-28 NOTE — Progress Notes (Signed)
IR aware of request for IVC filter removal. Dr. Barbie Banner has spoke with the ordering MD and he recommends to leave filter in at this time given patient's age and that the filter has been in for over a year. Any questions please feel free to call Dr. Barbie Banner [phone]  Tsosie Billing PA-C Interventional Radiology  02/28/15  8:40 AM

## 2015-03-01 DIAGNOSIS — B9620 Unspecified Escherichia coli [E. coli] as the cause of diseases classified elsewhere: Secondary | ICD-10-CM

## 2015-03-01 DIAGNOSIS — E46 Unspecified protein-calorie malnutrition: Secondary | ICD-10-CM | POA: Diagnosis not present

## 2015-03-01 DIAGNOSIS — I82509 Chronic embolism and thrombosis of unspecified deep veins of unspecified lower extremity: Secondary | ICD-10-CM | POA: Diagnosis not present

## 2015-03-01 DIAGNOSIS — Z5189 Encounter for other specified aftercare: Secondary | ICD-10-CM | POA: Diagnosis not present

## 2015-03-01 DIAGNOSIS — D638 Anemia in other chronic diseases classified elsewhere: Secondary | ICD-10-CM | POA: Diagnosis not present

## 2015-03-01 DIAGNOSIS — R5381 Other malaise: Secondary | ICD-10-CM | POA: Diagnosis not present

## 2015-03-01 DIAGNOSIS — M6281 Muscle weakness (generalized): Secondary | ICD-10-CM | POA: Diagnosis not present

## 2015-03-01 DIAGNOSIS — I82409 Acute embolism and thrombosis of unspecified deep veins of unspecified lower extremity: Secondary | ICD-10-CM | POA: Diagnosis not present

## 2015-03-01 DIAGNOSIS — D62 Acute posthemorrhagic anemia: Secondary | ICD-10-CM | POA: Diagnosis not present

## 2015-03-01 DIAGNOSIS — N390 Urinary tract infection, site not specified: Secondary | ICD-10-CM

## 2015-03-01 DIAGNOSIS — E870 Hyperosmolality and hypernatremia: Secondary | ICD-10-CM | POA: Diagnosis not present

## 2015-03-01 DIAGNOSIS — I10 Essential (primary) hypertension: Secondary | ICD-10-CM | POA: Diagnosis not present

## 2015-03-01 DIAGNOSIS — E785 Hyperlipidemia, unspecified: Secondary | ICD-10-CM | POA: Diagnosis not present

## 2015-03-01 LAB — BASIC METABOLIC PANEL
Anion gap: 6 (ref 5–15)
BUN: 15 mg/dL (ref 6–20)
CO2: 27 mmol/L (ref 22–32)
Calcium: 9.7 mg/dL (ref 8.9–10.3)
Chloride: 108 mmol/L (ref 101–111)
Creatinine, Ser: 1.05 mg/dL (ref 0.61–1.24)
GFR calc Af Amer: >60 mL/min (ref >60)
GFR calc non Af Amer: >60 mL/min (ref >60)
Glucose, Bld: 103 mg/dL — ABNORMAL HIGH (ref 65–99)
Potassium: 4.3 mmol/L (ref 3.5–5.1)
Sodium: 141 mmol/L (ref 135–145)

## 2015-03-01 LAB — CBC
HCT: 34 % — ABNORMAL LOW (ref 39.0–52.0)
Hemoglobin: 10.7 g/dL — ABNORMAL LOW (ref 13.0–17.0)
MCH: 29.5 pg (ref 26.0–34.0)
MCHC: 31.5 g/dL (ref 30.0–36.0)
MCV: 93.7 fL (ref 78.0–100.0)
Platelets: 184 10*3/uL (ref 150–400)
RBC: 3.63 MIL/uL — ABNORMAL LOW (ref 4.22–5.81)
RDW: 13.4 % (ref 11.5–15.5)
WBC: 8.2 10*3/uL (ref 4.0–10.5)

## 2015-03-01 LAB — CULTURE, BLOOD (ROUTINE X 2)

## 2015-03-01 LAB — URINE CULTURE: Culture: >=100000

## 2015-03-01 LAB — MAGNESIUM: Magnesium: 1.8 mg/dL (ref 1.7–2.4)

## 2015-03-01 MED ORDER — CEPHALEXIN 250 MG/5ML PO SUSR
250.0000 mg | Freq: Four times a day (QID) | ORAL | Status: DC
  Administered 2015-03-01: 250 mg via ORAL
  Filled 2015-03-01 (×4): qty 5

## 2015-03-01 MED ORDER — AMLODIPINE BESYLATE 5 MG PO TABS: 5.0000 mg | ORAL_TABLET | Freq: Every day | ORAL | Status: DC

## 2015-03-01 MED ORDER — CEPHALEXIN 250 MG PO CAPS: 250.0000 mg | ORAL_CAPSULE | Freq: Four times a day (QID) | ORAL | Status: DC

## 2015-03-01 MED ORDER — RIVAROXABAN 20 MG PO TABS: 20.0000 mg | ORAL_TABLET | Freq: Every day | ORAL | Status: DC

## 2015-03-01 NOTE — Progress Notes (Signed)
Physical Therapy Treatment Patient Details Name: [name] MRN: [id number] DOB: Nov 26, 1924 Today's Date: 03/01/2015    History of Present Illness Pt adm with gait instability and weakness. PMH - cerebellar CVA, HTN, DVT's, IVC filter, lt CVA.    PT Comments    Pt making steady progress. Could still benefit from ST-SNF unless family able to provide this level of physical assist at home.  Follow Up Recommendations  SNF (unless family can provide the level of physical assist neede)     Equipment Recommendations  None recommended by PT    Recommendations for Other Services       Precautions / Restrictions Precautions Precautions: Fall    Mobility  Bed Mobility Overal bed mobility: Needs Assistance Bed Mobility: Supine to Sit     Supine to sit: Mod assist;HOB elevated     General bed mobility comments: assist to bring hips to EOB  Transfers Overall transfer level: Needs assistance Equipment used: Rolling walker (2 wheeled);Ambulation equipment used Transfers: Sit to/from [name] Sit to Stand: +2 physical assistance;Mod assist;Min assist Stand pivot transfers: +2 physical assistance;Min assist (with Stedy)       General transfer comment: Min A to stand from bed using Stedy but +2 mod to stand with walker from chair.  Ambulation/Gait Ambulation/Gait assistance: +2 safety/equipment;Mod assist;Min assist Ambulation Distance (Feet): 10 Feet Assistive device: Rolling walker (2 wheeled) Gait Pattern/deviations: Step-to pattern;Decreased step length - right;Decreased step length - left;Shuffle;Leaning posteriorly   Gait velocity interpretation: <1.8 ft/sec, indicative of risk for recurrent falls General Gait Details: Initially mod A due to posterior lean but then min A as amb progressed.   Stairs            Wheelchair Mobility    Modified Rankin (Stroke Patients Only)       Balance   Sitting-balance support: Feet  supported;Bilateral upper extremity supported Sitting balance-Leahy Scale: Poor Sitting balance - Comments: Sat EOB x 5 minutes with supervision but using UE's.   Standing balance support: Bilateral upper extremity supported Standing balance-Leahy Scale: Poor Standing balance comment: Stood on St[place] x 4 minutes to be cleaned up. Min assist to maintain and cues to lean back toward lt.                    Cognition Arousal/Alertness: Awake/alert Behavior During Therapy: WFL for tasks assessed/performed Overall Cognitive Status: No family/caregiver present to determine baseline cognitive functioning Area of Impairment: Orientation;Following commands;Problem solving Orientation Level: Disoriented to;Time;Situation   Memory: Decreased short-term memory Following Commands: Follows one step commands consistently     Problem Solving: Requires verbal cues;Requires tactile cues      Exercises      General Comments        Pertinent Vitals/Pain Pain Assessment: No/denies pain    Home Living                      Prior Function            PT Goals (current goals can now be found in the care plan section) Acute Rehab PT Goals Patient Stated Goal: Not stated PT Goal Formulation: With patient Time For Goal Achievement: 03/06/15 Potential to Achieve Goals: Good Progress towards PT goals: Progressing toward goals    Frequency  Min 3X/week    PT Plan      Co-evaluation             End of Session Equipment Utilized During Treatment: Gait belt  Activity Tolerance: Patient limited by fatigue Patient left: in chair;with call bell/phone within reach;with chair alarm set     Time: 1005-1026 PT Time Calculation (min) (ACUTE ONLY): 21 min  Charges:  $Gait Training: 8-22 mins                    G Codes:      Lino Wickliff 2015-03-25, 11:40 AM Suanne Marker PT [phone]

## 2015-03-01 NOTE — Care Management Note (Signed)
Case Management Note  Patient Details  Name: [name] MRN: [id number] Date of Birth: 01-18-1925  Subjective/Objective:                  Patient from home with daughter. Admitted with infection and hematuria  Action/Plan:  Anticipate DC to SNF as facilitated through CSW.  Expected Discharge Date:                  Expected Discharge Plan:     In-House Referral:     Discharge planning Services     Post Acute Care Choice:    Choice offered to:     DME Arranged:    DME Agency:     HH Arranged:    [place] Agency:     Status of Service:     Medicare Important Message Given:  Yes Date Medicare IM Given:    Medicare IM give by:    Date Additional Medicare IM Given:    Additional Medicare Important Message give by:     If discussed at [place] of Stay Meetings, dates discussed:    Additional Comments:  Carles Collet, RN 03/01/2015, 2:58 PM

## 2015-03-01 NOTE — NC FL2 (Signed)
[place] LEVEL OF CARE SCREENING TOOL     IDENTIFICATION  Patient Name: [name] Birthdate: 1925/02/18 Sex: male Admission Date (Current Location): 02/26/2015  [facility] and [place] Number: Herbalist and Address:  [facility] [place] [place] [address], [place], [place] [address]      Provider Number: [id number]  Attending Physician Name and Address:  Axel Filler, MD  Relative Name and Phone Number:  [name], daughter, [phone]    Current Level of Care: Hospital Recommended Level of Care: [place] Prior Approval Number:    Date Approved/Denied:   PASRR Number: [id number]  Discharge Plan: SNF    Current Diagnoses: Patient Active Problem List   Diagnosis Date Noted  . E. coli urinary tract infection   . UTI (lower urinary tract infection) 02/27/2015  . Acute cystitis with hematuria   . Hypernatremia   . Hematuria 02/26/2015  . Weakness 02/26/2015  . Urinary tract infection, acute 07/17/2014  . Presence of IVC filter 07/17/2014  . Right knee pain 05/25/2014  . Long term current use of anticoagulant therapy- Xarelto 03/26/2014  . Onychomycosis 01/15/2014  . Vitreous hemorrhage ([place]) 12/21/2013  . Cerebral embolism with cerebral infarction ([place]) 12/19/2013  . Gait abnormality 12/18/2013  . Left adrenal mass ([place]) 09/11/2013  . Orthostatic hypotension 07/10/2013  . CKD (chronic kidney disease) stage 3, GFR 30-59 ml/min 06/23/2013  . Hyperlipidemia   . Normocytic anemia 06/11/2013  . Leg DVT (deep venous thromboembolism), chronic ([place]) 06/10/2013  . Physical deconditioning 05/30/2013  . BPH (benign prostatic hyperplasia) 04/24/2013  . Preoperative examination 04/24/2013  . Primary hyperparathyroidism ([place]) 09/02/2012  . Health care maintenance 08/30/2012  . Frequent falls 06/23/2012  . Thrombotic stroke involving right cerebellar artery ([place]) 09/15/2011  . 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 Hearing loss 07/20/2011  . Meningioma ([place]) 07/20/2011  . HTN (hypertension) 07/19/2011  . CVA (cerebral infarction) 07/19/2011    Orientation RESPIRATION BLADDER Height & Weight    Self, Place, Situation  Normal Continent, External catheter (Condom Catheter)   164 lbs.  BEHAVIORAL SYMPTOMS/MOOD NEUROLOGICAL BOWEL NUTRITION STATUS     (N/A) Incontinent  (See DC Summary)  AMBULATORY STATUS COMMUNICATION OF NEEDS Skin   Extensive Assist Verbally Normal                       Personal Care Assistance Level of Assistance  Bathing, Feeding, Dressing Bathing Assistance: Maximum assistance Feeding assistance: Limited assistance Dressing Assistance: Maximum assistance     Functional Limitations Info             SPECIAL CARE FACTORS FREQUENCY  PT (By licensed PT), OT (By licensed OT)     PT Frequency: 5x/week OT Frequency: 2x/week            Contractures      Additional Factors Info  Code Status, Allergies Code Status Info: Full Allergies Info: NKA           Current Medications (03/01/2015):  This is the current hospital active medication list Current Facility-Administered Medications  Medication Dose Route Frequency Provider Last Rate Last Dose  . acetaminophen (TYLENOL) tablet 650 mg  650 mg Oral Q6H PRN Milagros Loll, MD       Or  . acetaminophen (TYLENOL) suppository 650 mg  650 mg Rectal Q6H PRN Milagros Loll, MD      . amLODipine (NORVASC) tablet 5 mg  5 mg  Oral Daily Maryellen Pile, MD   5 mg at 03/01/15 1100  . atorvastatin (LIPITOR) tablet 20 mg  20 mg Oral q1800 Milagros Loll, MD   20 mg at 02/28/15 1659  . cephALEXin (KEFLEX) 250 MG/5ML suspension 250 mg  250 mg Oral 4 times per day Maryellen Pile, MD      . irbesartan (AVAPRO) tablet 150 mg  150 mg Oral Daily Milagros Loll, MD   150 mg at 03/01/15 1120  . metoprolol tartrate (LOPRESSOR) tablet 25 mg  25 mg Oral BID Milagros Loll, MD   25 mg at  03/01/15 1100  . polyethylene glycol (MIRALAX / GLYCOLAX) packet 17 g  17 g Oral Daily PRN Milagros Loll, MD   17 g at 02/28/15 1032  . potassium chloride SA (K-DUR,KLOR-CON) CR tablet 40 mEq  40 mEq Oral BID Maryellen Pile, MD   40 mEq at 03/01/15 1100  . rivaroxaban (XARELTO) tablet 20 mg  20 mg Oral Q supper Axel Filler, MD      . sodium chloride 0.9 % injection 3 mL  3 mL Intravenous Q12H Milagros Loll, MD   3 mL at 02/28/15 1034     Discharge Medications: Please see discharge summary for a list of discharge medications.  Relevant Imaging Results:  Relevant Lab Results:   Additional Information SSN: [id number]  [name], LCSWA

## 2015-03-01 NOTE — Progress Notes (Signed)
Case discussed with Dr. Hoffman at the time of the visit.  We reviewed the resident's history and exam and pertinent patient test results.  I agree with the assessment, diagnosis and plan of care documented in the resident's note. 

## 2015-03-01 NOTE — Progress Notes (Signed)
Subjective: No complaints this morning. Patient is resting comfortably in bed eating breakfast. Denies any dysuria, abdominal pain or back pain. No nausea or vomiting.   Objective: Vital signs in last 24 hours: Filed Vitals:   02/28/15 1501 02/28/15 2114 03/01/15 0550 03/01/15 0553  BP: 124/74 148/91 126/86   Pulse: 74 72 68   Temp: 98.3 F (36.8 C) 98.2 F (36.8 C) 98.2 F (36.8 C)   TempSrc: Oral Oral Oral   Resp: 23 23 17    Height:      Weight:    164 lb 4.8 oz (74.526 kg)  SpO2: 98% 98% 94%    Weight change: -3 lb 7.5 oz (-1.574 kg)  Intake/Output Summary (Last 24 hours) at 03/01/15 [id number] Last data filed at 03/01/15 [id number]  Gross per 24 hour  Intake    490 ml  Output   2425 ml  Net  -1935 ml    PHYSICAL EXAM GENERAL- alert, co-operative, appears as stated age, not in any distress. HEENT- Atraumatic, normocephalic, oral mucosa appears moist CARDIAC- RRR, no murmurs, rubs or gallops. RESP- Moving equal volumes of air, and clear to auscultation bilaterally, no wheezes or crackles. ABDOMEN- Soft, nontender, bowel sounds present. NEURO- No obvious Cr N abnormality. AAOx2 (not to time). No focal deficits. EXTREMITIES- pulse 2+, symmetric, no pedal edema. SKIN- Warm, dry, No rash or lesion. PSYCH- Normal mood and affect  Medications: I have reviewed the patient's current medications. Scheduled Meds: . amLODipine  5 mg Oral Daily  . atorvastatin  20 mg Oral q1800  . cefTRIAXone (ROCEPHIN)  IV  1 g Intravenous Q24H  . irbesartan  150 mg Oral Daily  . metoprolol tartrate  25 mg Oral BID  . potassium chloride  40 mEq Oral BID  . Rivaroxaban  15 mg Oral Q supper  . sodium chloride  3 mL Intravenous Q12H   Continuous Infusions:  PRN Meds:.acetaminophen **OR** acetaminophen, polyethylene glycol Assessment/Plan:  Hematuria and acute encephalopathy secondary to Urinary Tract Infection: Acute on chronic weakness. No signs of CVA on CT head. No focal deficits on exam. History  of BPH s/p TURP. He has remained afebrile with no leukocytosis.  -Urine culture growing E. Coli, sensitivities pending -1/2 BCx with GPC in clusters, repeating cultures -Continue IV ceftriaxone -PT/OT recommending SNF, social work consulted for placement  Hypernatremia, Free water deficit: Resolved. Sodium 141 this morning. Patient is tolerating PO intake. Will continue to have ready access to water for patient.   Acute on chronic anemia, acute blood loss anemia: Acute blood loss due to hematuria. Baseline hemoglobin around 11-12. Hgb on presentation 11.9. Hgb stable at 10.7.  -Continue to monitor. -Continue   Hypertension: BP 124-157/74-91.  -Continue home metoprolol tartrate 25mg  BID -Continue amlodpine 5mg  daily -Conintue irbesartan at 150mg  daily  DVT on rivaroxaban, IVC filter: Patient with IVF filter placed > year ago. IR aware of request for IVC filter and recommend leaving filter in place at this time given patient's age and that it has been there for over a year. -Continue rivaroxaban  HLD: Continue home Atorvastain 20 mg daily  VTE PPx: Xarelto  Dispo: Disposition is deferred at this time, awaiting improvement of current medical problems.  Anticipated discharge in approximately 1-2 day(s).   The patient does have a current PCP [name], DO) and does need an [facility] hospital follow-up appointment after discharge.  The patient does not have transportation limitations that hinder transportation to clinic appointments.  .Services Needed at time of discharge:  Y = Yes, Blank = No PT:   OT:   RN:   Equipment:   Other:     LOS: 3 days   Maryellen Pile, MD IMTS PGY-1 [phone] 03/01/2015, 9:37 AM

## 2015-03-01 NOTE — Clinical Social Work Note (Signed)
Clinical Social Work Assessment  Patient Details  Name: [name] MRN: [id number] Date of Birth: Aug 20, 1924  Date of referral:  03/01/15               Reason for consult:  Facility Placement                Permission sought to share information with:  Facility Sport and exercise psychologist, Family Supports Permission granted to share information::  Yes, Verbal Permission Granted  Name::     [place]::  [facility] SNFs  Relationship::  Daughter  Contact Information:  [phone]  Housing/Transportation Living arrangements for the past 2 months:  [place] of Information:  Adult Children Patient Interpreter Needed:  None Criminal Activity/Legal Involvement Pertinent to Current Situation/Hospitalization:  No - Comment as needed Significant Relationships:  Adult Children Lives with:  Self Do you feel safe going back to the place where you live?  No Need for family participation in patient care:  Yes (Comment)  Care giving concerns:  CSW received referral for possible SNF placement at time of discharge. CSW met with patient and patient's daughter at bedside regarding PT recommendation of SNF placement at time of discharge. Per patient's daughter, patient is currently unable to care for himself at his home given patient's current physical needs and fall risk. Patient and patient's daughter expressed understanding of PT recommendation and are agreeable to SNF placement at time of discharge. CSW to continue to follow and assist with discharge planning needs.   Social Worker assessment / plan:  CSW spoke with patient and patient's daughter concerning possibility of rehab at [facility] before returning home.  Employment status:  Retired Nurse, adult PT Recommendations:  [place] / Referral to community resources:  [place]  Patient/Family's Response to care:  Patient and patient's husband recognize  need for rehab before returning home and are agreeable to a SNF in [place]. Patient reported preference for [facility].  Patient/Family's Understanding of and Emotional Response to Diagnosis, Current Treatment, and Prognosis:  Patient is realistic regarding therapy needs. No questions/concerns about plan or treatment.    Emotional Assessment Appearance:  Appears stated age Attitude/Demeanor/Rapport:   (Appropriate) Affect (typically observed):  Appropriate Orientation:  Oriented to Self, Oriented to Place, Oriented to Situation Alcohol / Substance use:  Not Applicable Psych involvement (Current and /or in the community):  No (Comment)  Discharge Needs  Concerns to be addressed:  Care Coordination Readmission within the last 30 days:  No Current discharge risk:  None Barriers to Discharge:  No Barriers Identified   [name], [place] 03/01/2015, 2:35 PM

## 2015-03-01 NOTE — Progress Notes (Signed)
Patient will DC to: [facility] Anticipated DC date: 03/01/15 Family notified: Daughter, Chief Financial Officer by: PTAR   CSW signing off.  Cedric Fishman, [place] Social Worker [phone]

## 2015-03-01 NOTE — Clinical Social Work Placement (Signed)
   CLINICAL SOCIAL WORK PLACEMENT  NOTE  Date:  03/01/2015  Patient Details  Name: [name] MRN: [id number] Date of Birth: 01-Apr-1924  Clinical Social Work is seeking post-discharge placement for this patient at the [place] level of care (*CSW will initial, date and re-position this form in  chart as items are completed):  Yes   Patient/family provided with [facility] Work Department's list of facilities offering this level of care within the geographic area requested by the patient (or if unable, by the patient's family).  Yes   Patient/family informed of their freedom to choose among providers that offer the needed level of care, that participate in Medicare, Medicaid or managed care program needed by the patient, have an available bed and are willing to accept the patient.  Yes   Patient/family informed of 's ownership interest in [facility] and [facility], as well as of the fact that they are under no obligation to receive care at these facilities.  PASRR submitted to EDS on       PASRR number received on       Existing PASRR number confirmed on 03/01/15     FL2 transmitted to all facilities in geographic area requested by pt/family on 03/01/15     FL2 transmitted to all facilities within larger geographic area on       Patient informed that his/her managed care company has contracts with or will negotiate with certain facilities, including the following:            Patient/family informed of bed offers received.  Patient chooses bed at [facility]     Physician recommends and patient chooses bed at      Patient to be transferred to [facility] on 03/01/15.  Patient to be transferred to facility by [facility]     Patient family notified on 03/01/15 of transfer.  Name of family member notified:  Reino Kent, [phone]     PHYSICIAN Please sign FL2, Please prepare priority discharge summary, including  medications     Additional Comment:    _______________________________________________ [name], LCSWA 03/01/2015, 3:06 PM

## 2015-03-01 NOTE — Care Management Important Message (Signed)
Important Message  Patient Details  Name: [name] MRN: [id number] Date of Birth: 31-Jul-1924   Medicare Important Message Given:  Yes    Nathen May 03/01/2015, 12:29 PM

## 2015-03-01 NOTE — Progress Notes (Signed)
Report given to nursing supervisor at facility. All questions answered.

## 2015-03-01 NOTE — Discharge Summary (Signed)
Name: [name] MRN: [id number] DOB: 1924-10-28 81 y.o. PCP: [name], DO  Date of Admission: 02/26/2015  5:29 PM Date of Discharge: 03/01/2015 Attending Physician: Axel Filler, MD  Discharge Diagnosis: Active Problems:   Hematuria   Weakness   Acute cystitis with hematuria   Hypernatremia   UTI (lower urinary tract infection)   E. coli urinary tract infection  Discharge Medications:   Medication List    TAKE these medications        amLODipine 5 MG tablet  Commonly known as:  NORVASC  Take 1 tablet (5 mg total) by mouth daily.     atorvastatin 20 MG tablet  Commonly known as:  LIPITOR  Take 1 tablet (20 mg total) by mouth daily at 6 PM.     cephALEXin 250 MG capsule  Commonly known as:  KEFLEX  Take 1 capsule (250 mg total) by mouth 4 (four) times daily.     irbesartan 300 MG tablet  Commonly known as:  AVAPRO  Take 1 tablet (300 mg total) by mouth daily.     metoprolol tartrate 25 MG tablet  Commonly known as:  LOPRESSOR  Take 1 tablet (25 mg total) by mouth 2 (two) times daily.     Rivaroxaban 15 MG Tabs tablet  Commonly known as:  XARELTO  take 1 tablet by mouth once daily WITH SUPPER        Disposition and follow-up:   JimmyJess D Arroyo was discharged from [facility] in Good condition.  At the hospital follow up visit please address:  1. UTI with acute encephalopathy - Will finish Keflex course on 12/29. AAOx2 (not time). Please assess for any further signs/symptoms of infection.  HTN- Continued home dose metoprolol and irbesartan. Added amlodipine 5 mg daily. SBP 120-140.   2.  Labs / imaging needed at time of follow-up: None  3.  Pending labs/ test needing follow-up: Blood and urine cultures  Follow-up Appointments:   Discharge Instructions:   Consultations:    Procedures Performed:  Ct Head Wo Contrast  02/27/2015  CLINICAL DATA:  Initial evaluation for worsening weakness and instability with  staining for past 4-5 days. EXAM: CT HEAD WITHOUT CONTRAST TECHNIQUE: Contiguous axial images were obtained from the base of the skull through the vertex without intravenous contrast. COMPARISON:  Prior CT from 07/09/2014. FINDINGS: Moderate diffuse cerebral atrophy is present, overall not significantly changed relative to previous exam. Extensive hypodensity within the periventricular and deep white matter of both cerebral hemispheres most consistent with chronic small vessel ischemic disease, moderate to advanced in nature. Remote lacunar infarct present within the right thalamus. Additional large remote right cerebellar infarct, with smaller left cerebellar infarcts. No evidence for new acute large vessel territory infarct. No acute intracranial hemorrhage. Partially calcified meningioma along the left sphenoid wing is not significantly changed measuring 2.5 x 2.6 cm on today's study. No significant edema. No other mass lesion. Ventricular prominence related to global parenchymal volume loss present without hydrocephalus. No extra-axial fluid collection. Scalp soft tissues within normal limits. No acute abnormality about the orbits. Paranasal sinuses are clear. Remote defect present at the left lamina papyracea. No significant mastoid effusion. Calvarium intact. IMPRESSION: 1. No acute intracranial process. 2. Stable left sphenoid wing meningioma without associated edema. 3. Advanced age-related cerebral atrophy with chronic small vessel ischemic disease and multiple remote infarcts as above, stable. Electronically Signed   By: Jeannine Boga M.D.   On: 02/27/2015 01:06  Admission HPI: [name] is an 81 year old gentleman with a past medical history of cerebellar stroke, HTN, DVT on xarelto, BPH s/p TURP presented to clinic today with complaints of worsening weakness and gain instability as well as hematuria. His daughter is his primary care giver and provided most of the history. She reports that  over the past 4 days he is having more increased difficulty with his gait and stability. He suffered a cerebellar stroke in 2013 and has baseline gait instability but is usually able to walk on his own with the aid of a walker. Over the past 4 days he has been wheelchair bound due to instability but has had no falls. The daughter also reports he has more slurring of his speech that usual. She also reports he had an episode of hematuria this morning. He has had hematuria in the past associated with his anticoagulation and TURP done in 2015. She reports that he has had UTI in the past that have caused him to act in a similar manner. He was seen in the ED in April for a UTI. She reports she believes he has felt feverish but has not taken his temperature. The patient denies any symptoms. No dysuria, fevers or chills. He is alert and oriented to person and place.   Hospital Course by problem list: Hematuria and acute encephalopathy secondary to Urinary Tract Infection: Patient presented with acute on chronic weakness with reported more slurring of speech per daughter who is primary care giver. No signs of CVA on CT head. No focal deficits on exam. He has a history of BPH s/p TURP and on Xarelto for recurrent DVTs with a history of hematuria. UA was positive for a UTI with urine cultures growing E. Coli. He was started on Ceftriaxone for 3 days and transitioned to Keflex for a 14 day total course. Blood cultures grew coag negative staph in 1/2, most likely a contaminant. PT/OT eval recommended SNF placement.   Hypernatremia, Free water deficit: Patient with a Na of 148 on admission and a free water deficit of 2.06L. Resolved following replacement with D5W. Patient toleratedPO intake. Will continue to have ready access to water for patient.   Acute on chronic anemia, acute blood loss anemia: Acute blood loss due to hematuria. Baseline hemoglobin around 11-12. Hgb on presentation 11.9. Hgb was stable around 10.5-11  during hospitalization.   Hypertension: BP initially elevated on admission to 168/94-180/94. Home meds initially held to rule out CVA. Restarted home metoprolol tartrate 25mg  BID and  irbesartan at 150mg  daily. BPs still elevated and amlodpine 5mg  daily was added. BP improved and stable 0000000 systolic.   DVT on rivaroxaban, IVC filter: Patient with IVF filter placed > year ago. IR recommend leaving filter in place at this time given patient's age and that it has been there for over a year. Continued rivaroxaban.  HLD: Continued home Atorvastain 20 mg daily.  Discharge Vitals:   BP 114/70 mmHg  Pulse 84  Temp(Src) 98.4 F (36.9 C) (Oral)  Resp 20  Ht 5\' 11"  (1.803 m)  Wt 164 lb 4.8 oz (74.526 kg)  BMI 22.93 kg/m2  SpO2 96%  Discharge Labs:  Results for orders placed or performed during the hospital encounter of 02/26/15 (from the past 24 hour(s))  Basic metabolic panel     Status: Abnormal   Collection Time: 03/01/15  6:07 AM  Result Value Ref Range   Sodium 141 135 - 145 mmol/L   Potassium 4.3 3.5 -  5.1 mmol/L   Chloride 108 101 - 111 mmol/L   CO2 27 22 - 32 mmol/L   Glucose, Bld 103 (H) 65 - 99 mg/dL   BUN 15 6 - 20 mg/dL   Creatinine, Ser 1.05 0.61 - 1.24 mg/dL   Calcium 9.7 8.9 - 10.3 mg/dL   GFR calc non Af Amer >60 >60 mL/min   GFR calc Af Amer >60 >60 mL/min   Anion gap 6 5 - 15  CBC     Status: Abnormal   Collection Time: 03/01/15  6:07 AM  Result Value Ref Range   WBC 8.2 4.0 - 10.5 K/uL   RBC 3.63 (L) 4.22 - 5.81 MIL/uL   Hemoglobin 10.7 (L) 13.0 - 17.0 g/dL   HCT 34.0 (L) 39.0 - 52.0 %   MCV 93.7 78.0 - 100.0 fL   MCH 29.5 26.0 - 34.0 pg   MCHC 31.5 30.0 - 36.0 g/dL   RDW 13.4 11.5 - 15.5 %   Platelets 184 150 - 400 K/uL  Magnesium     Status: None   Collection Time: 03/01/15  6:07 AM  Result Value Ref Range   Magnesium 1.8 1.7 - 2.4 mg/dL    Signed: Maryellen Pile, MD 03/01/2015, 11:32 AM

## 2015-03-02 ENCOUNTER — Non-Acute Institutional Stay (SKILLED_NURSING_FACILITY): Payer: Medicare Other | Admitting: Adult Health

## 2015-03-02 ENCOUNTER — Telehealth: Payer: Self-pay | Admitting: Licensed Clinical Social Worker

## 2015-03-02 DIAGNOSIS — I10 Essential (primary) hypertension: Secondary | ICD-10-CM | POA: Diagnosis not present

## 2015-03-02 DIAGNOSIS — E43 Unspecified severe protein-calorie malnutrition: Secondary | ICD-10-CM

## 2015-03-02 DIAGNOSIS — R5381 Other malaise: Secondary | ICD-10-CM | POA: Diagnosis not present

## 2015-03-02 DIAGNOSIS — E785 Hyperlipidemia, unspecified: Secondary | ICD-10-CM

## 2015-03-02 DIAGNOSIS — I82409 Acute embolism and thrombosis of unspecified deep veins of unspecified lower extremity: Secondary | ICD-10-CM | POA: Diagnosis not present

## 2015-03-02 DIAGNOSIS — N390 Urinary tract infection, site not specified: Secondary | ICD-10-CM

## 2015-03-02 DIAGNOSIS — D62 Acute posthemorrhagic anemia: Secondary | ICD-10-CM | POA: Diagnosis not present

## 2015-03-02 NOTE — Telephone Encounter (Signed)
CSW returned call to Ms. [employer], pt's daughter and HCPOA.  Daughter states she had difficulty when updating pt's address for his [facility] Medicare policy.  Ms. Eston Esters attempted to resolve the issue through [facility] but was informed she would need to become Mr. Bazil Representative Payee to update address and handle pt's SSA funds.  Daughter states she is in need of a letter from pt's PCP listing pt's diagnosis and noting daughter, [name] as primary caregiver.  CSW informed Ms. Rembert, this note would be forwarded to Dr. Heber [place].  Ms. Eston Esters requesting letter prior to the end of December to prevent any disturbance in pt's insurance status.

## 2015-03-02 NOTE — Progress Notes (Signed)
Internal Medicine Clinic Attending  I saw and evaluated the patient.  I personally confirmed the key portions of the history and exam documented by Dr. Rice and I reviewed pertinent patient test results.  The assessment, diagnosis, and plan were formulated together and I agree with the documentation in the resident's note.  

## 2015-03-03 ENCOUNTER — Encounter: Payer: Self-pay | Admitting: Adult Health

## 2015-03-03 LAB — CULTURE, BLOOD (ROUTINE X 2): Culture: NO GROWTH

## 2015-03-03 NOTE — Progress Notes (Signed)
Patient ID: [name], male   DOB: 10/25/1924, 81 y.o.   MRN: [id number]    DATE:  03/02/15  MRN:  [id number]  BIRTHDAY: [birth year]  Facility:  Nursing Home Location:  [place] Room Number: [id number]  LEVEL OF CARE:  SNF [phone])  Contact Information    Name Relation Home Work Mobile   [place] Grandaughter   [phone]   [place] Daughter [phone]       Chief Complaint  Patient presents with  . Hospitalization Follow-up    Physical deconditioning, UTI, Anemia, DVT, hyperlipidemia and protein calorie malnutrition    HISTORY OF PRESENT ILLNESS:  This is an 81 year old male who has been admitted to [facility] on 03/01/15 from [facility]. He has PMH of cerebellar stroke, hypertension, DVT on Xarelto, BPH S/P TURP. He was having complaints of worsening weakness, instability and hematuria. He suffered a cerebellar stroke  in 2013, although he has gait instability, he is able to walk with walker. He is on Xarelto for recurrent DVTs. No signs of CVA on CT head. He was diagnosed with UTI - urine culture grew E. Coli. He was started on Ceftriaxone X 3 days and transitioned to Keflex for a total course of 14 days.  He has been admitted for a short-term rehabilitation.   PAST MEDICAL HISTORY:  Past Medical History  Diagnosis Date  . Hypertension   . Difficulty hearing, right     BILATERAL HEARING LOSS - BEST TO TRY TO SPEAK INTO LEFT EAR  . Meningioma ([place])   . Frequent falls   . BPH (benign prostatic hypertrophy)     TURP 05/19/13  . Chronic kidney disease     CHRONIC KIDNEY DISEASE, 2  . Anemia   . Thrombocytopenia ([place])   . Stroke ([place])     Cerebellar, 2013; WALKS WITH WALKER, ABLE TO DRESS AND BATHE HIMSELF BUT FAMILY TRIES TO PROVIDE SUPERVISION BECAUSE OF HIS HX OF FALL AND WEAKNESS LEGS, ARMS   . Incontinence of urine     SOME INCONTINENCE  . Hyperlipidemia   . DVT (deep venous  thrombosis) ([facility])      CURRENT MEDICATIONS: Reviewed  Patient's Medications  New Prescriptions   No medications on file  Previous Medications   AMLODIPINE (NORVASC) 5 MG TABLET    Take 1 tablet (5 mg total) by mouth daily.   ATORVASTATIN (LIPITOR) 20 MG TABLET    Take 1 tablet (20 mg total) by mouth daily at 6 PM.   CEPHALEXIN (KEFLEX) 250 MG CAPSULE    Take 1 capsule (250 mg total) by mouth 4 (four) times daily.   IRBESARTAN (AVAPRO) 300 MG TABLET    Take 1 tablet (300 mg total) by mouth daily.   METOPROLOL TARTRATE (LOPRESSOR) 25 MG TABLET    Take 1 tablet (25 mg total) by mouth 2 (two) times daily.   PROTEIN (PROCEL PO)    Take 2 scoop by mouth 2 (two) times daily.   RIVAROXABAN (XARELTO) 15 MG TABS TABLET    take 1 tablet by mouth once daily WITH SUPPER  Modified Medications   No medications on file  Discontinued Medications   No medications on file     No Known Allergies   REVIEW OF SYSTEMS:  GENERAL: no change in appetite, no fatigue, no weight changes, no fever, chills or weakness EYES: Denies change in vision, dry eyes, eye pain, itching or discharge EARS: Denies change in hearing, ringing in  ears, or earache NOSE: Denies nasal congestion or epistaxis MOUTH and THROAT: Denies oral discomfort, gingival pain or bleeding, pain from teeth or hoarseness   RESPIRATORY: no cough, SOB, DOE, wheezing, hemoptysis CARDIAC: no chest pain, edema or palpitations GI: no abdominal pain, diarrhea, constipation, heart burn, nausea or vomiting GU: Denies dysuria, frequency, hematuria, incontinence, or discharge PSYCHIATRIC: Denies feeling of depression or anxiety. No report of hallucinations, insomnia, paranoia, or agitation   PHYSICAL EXAMINATION  GENERAL APPEARANCE: Well nourished. In no acute distress. Normal body habitus HEAD: Normal in size and contour. No evidence of trauma EYES: Lids open and close normally. No blepharitis, entropion or ectropion. PERRL. Conjunctivae are clear  and sclerae are white. Lenses are without opacity EARS: Pinnae are normal. Patient hears normal voice tunes of the examiner MOUTH and THROAT: Lips are without lesions. Oral mucosa is moist and without lesions. Tongue is normal in shape, size, and color and without lesions NECK: supple, trachea midline, no neck masses, no thyroid tenderness, no thyromegaly LYMPHATICS: no LAN in the neck, no supraclavicular LAN RESPIRATORY: breathing is even & unlabored, BS CTAB CARDIAC: RRR, no murmur,no extra heart sounds, no edema GI: abdomen soft, normal BS, no masses, no tenderness, no hepatomegaly, no splenomegaly GU:  Has foley catheter draining to urine bag with dark orange colored urine EXTREMITIES:  Able to move X 4 extremities PSYCHIATRIC: Alert and oriented X 3. Affect and behavior are appropriate  LABS/RADIOLOGY: Labs reviewed: Basic Metabolic Panel:  Recent Labs  02/26/15 1912  02/27/15 1810 02/28/15 0617 03/01/15 0607  NA 148*  < > 143 139 141  K 3.9  < > 3.7 3.6 4.3  CL 114*  < > 107 106 108  CO2 26  < > 30 27 27   GLUCOSE 119*  < > 118* 110* 103*  BUN 18  < > 17 13 15   CREATININE 1.26*  < > 1.22 1.16 1.05  CALCIUM 10.3  < > 9.8 9.4 9.7  MG 1.9  --   --   --  1.8  PHOS 2.6  --   --   --   --   < > = values in this interval not displayed. Liver Function Tests:  Recent Labs  07/09/14 1306 02/26/15 1912  AST 25 28  ALT 16 19  ALKPHOS 61 51  BILITOT 0.9 1.1  PROT 6.8 6.9  ALBUMIN 3.3* 3.0*   CBC:  Recent Labs  07/09/14 1306 02/26/15 1912  02/27/15 1950 02/28/15 0617 03/01/15 0607  WBC 9.2 9.4  < > 9.0 8.5 8.2  NEUTROABS 7.0 7.1  --   --   --   --   HGB 12.8* 11.9*  < > 11.5* 10.1* 10.7*  HCT 38.7* 37.4*  < > 36.5* 31.0* 34.0*  MCV 92.1 97.9  < > 96.1 93.9 93.7  PLT 97* 169  < > 169 157 184  < > = values in this interval not displayed.   CBG:  Recent Labs  02/27/15 0756  GLUCAP 107*    Ct Head Wo Contrast  02/27/2015  CLINICAL DATA:  Initial evaluation  for worsening weakness and instability with staining for past 4-5 days. EXAM: CT HEAD WITHOUT CONTRAST TECHNIQUE: Contiguous axial images were obtained from the base of the skull through the vertex without intravenous contrast. COMPARISON:  Prior CT from 07/09/2014. FINDINGS: Moderate diffuse cerebral atrophy is present, overall not significantly changed relative to previous exam. Extensive hypodensity within the periventricular and deep white matter of both cerebral hemispheres  most consistent with chronic small vessel ischemic disease, moderate to advanced in nature. Remote lacunar infarct present within the right thalamus. Additional large remote right cerebellar infarct, with smaller left cerebellar infarcts. No evidence for new acute large vessel territory infarct. No acute intracranial hemorrhage. Partially calcified meningioma along the left sphenoid wing is not significantly changed measuring 2.5 x 2.6 cm on today's study. No significant edema. No other mass lesion. Ventricular prominence related to global parenchymal volume loss present without hydrocephalus. No extra-axial fluid collection. Scalp soft tissues within normal limits. No acute abnormality about the orbits. Paranasal sinuses are clear. Remote defect present at the left lamina papyracea. No significant mastoid effusion. Calvarium intact. IMPRESSION: 1. No acute intracranial process. 2. Stable left sphenoid wing meningioma without associated edema. 3. Advanced age-related cerebral atrophy with chronic small vessel ischemic disease and multiple remote infarcts as above, stable. Electronically Signed   By: Jeannine Boga M.D.   On: 02/27/2015 01:06    ASSESSMENT/PLAN:  Physical deconditioning - for rehabilitation  UTI - continue Keflex 250 mg capsule QID  Anemia, acute blood loss - hgb 10.7; check CBC  HTN - continue Metoprolol tartrate 25 mg BID, Irbesartan 150 mg daily and Amlodipine 5 mg daily; check BMP  DVT - has IVC filter  placed a year ago; continue Xarelto 15 mg daily  Hyperlipidemia -  Continue Atorvastatin 20 mg daily  Protein-calorie malnutrition, severe - albumin 3.0; start Procel 2 scoops BID    Goals of care:  Short-term rehabilitation     [facility], NP [place] [phone]

## 2015-03-04 ENCOUNTER — Encounter: Payer: Self-pay | Admitting: Internal Medicine

## 2015-03-04 ENCOUNTER — Non-Acute Institutional Stay (SKILLED_NURSING_FACILITY): Payer: Medicare Other | Admitting: Internal Medicine

## 2015-03-04 ENCOUNTER — Encounter: Payer: Medicare Other | Admitting: Internal Medicine

## 2015-03-04 DIAGNOSIS — N390 Urinary tract infection, site not specified: Secondary | ICD-10-CM

## 2015-03-04 DIAGNOSIS — B9620 Unspecified Escherichia coli [E. coli] as the cause of diseases classified elsewhere: Secondary | ICD-10-CM

## 2015-03-04 DIAGNOSIS — E46 Unspecified protein-calorie malnutrition: Secondary | ICD-10-CM

## 2015-03-04 DIAGNOSIS — E870 Hyperosmolality and hypernatremia: Secondary | ICD-10-CM | POA: Diagnosis not present

## 2015-03-04 DIAGNOSIS — I10 Essential (primary) hypertension: Secondary | ICD-10-CM | POA: Diagnosis not present

## 2015-03-04 DIAGNOSIS — R5381 Other malaise: Secondary | ICD-10-CM

## 2015-03-04 DIAGNOSIS — D638 Anemia in other chronic diseases classified elsewhere: Secondary | ICD-10-CM

## 2015-03-04 DIAGNOSIS — I82509 Chronic embolism and thrombosis of unspecified deep veins of unspecified lower extremity: Secondary | ICD-10-CM

## 2015-03-04 DIAGNOSIS — E785 Hyperlipidemia, unspecified: Secondary | ICD-10-CM

## 2015-03-04 NOTE — Progress Notes (Signed)
Patient ID: [name], male   DOB: January 31, 1925, 81 y.o.   MRN: [id number]     [facility]   PCP: [name], DO  Code Status: full code  No Known Allergies  Chief Complaint  Patient presents with  . New Admit To SNF     HPI:  81 y.o. patient is here for short term rehabilitation post hospital admission from 02/26/15-03/01/15 with acute encephalopathy and hematuria from E.coli UTI. He was started on antibiotics. He also had hypernatremia with free water deficit and resolved post D5W. He has PMH of cerebellar stroke, hypertension, DVT on Xarelto, BPH S/P TURP among others. He is seen in his room today. He is very hard of hearing. He denies any concerns. No new concern from nursing.    Review of Systems:  Constitutional: Negative for fever, chills, diaphoresis.  HENT: Negative for headache, congestion, nasal discharge Eyes: Negative for eye pain, blurred vision, double vision and discharge.  Respiratory: Negative for cough, shortness of breath and wheezing.   Cardiovascular: Negative for chest pain, palpitations, leg swelling.  Gastrointestinal: Negative for heartburn, nausea, vomiting, abdominal pain Genitourinary: Negative for dysuria  Musculoskeletal: Negative for back pain, falls Skin: Negative for itching, rash.  Neurological: Negative for dizziness, tingling, focal weakness Psychiatric/Behavioral: Negative for depression   Past Medical History  Diagnosis Date  . Hypertension   . Difficulty hearing, right     BILATERAL HEARING LOSS - BEST TO TRY TO SPEAK INTO LEFT EAR  . Meningioma ([place])   . Frequent falls   . BPH (benign prostatic hypertrophy)     TURP 05/19/13  . Chronic kidney disease     CHRONIC KIDNEY DISEASE, 2  . Anemia   . Thrombocytopenia ([place])   . Stroke ([place])     Cerebellar, 2013; WALKS WITH WALKER, ABLE TO DRESS AND BATHE HIMSELF BUT FAMILY TRIES TO PROVIDE SUPERVISION BECAUSE OF HIS HX OF FALL AND WEAKNESS LEGS,  ARMS   . Incontinence of urine     SOME INCONTINENCE  . Hyperlipidemia   . DVT (deep venous thrombosis) [facility])    Past Surgical History  Procedure Laterality Date  . Myringotomy with tube placement Bilateral   . Transurethral resection of prostate N/A 05/19/2013    Procedure: TRANSURETHRAL RESECTION OF THE PROSTATE WITH GYRUS INSTRUMENTS;  Surgeon: Ailene Rud, MD;  Location: [facility];  Service: Urology;  Laterality: N/A;  . Cystoscopy N/A 06/13/2013    Procedure: CYSTOSCOPY FLEXIBLE BEDSIDE;  Surgeon: Ardis Hughs, MD;  Location: [place];  Service: Urology;  Laterality: N/A;   Social History:   reports that he has never smoked. He has never used smokeless tobacco. He reports that he does not drink alcohol or use illicit drugs.  Family History  Problem Relation Age of Onset  . Diabetes Mother   . Heart disease Mother   . Hypertension Mother   . Heart disease Father   . Hypertension Father   . Hypertension Daughter     Medications:   Medication List       This list is accurate as of: 03/04/15  4:23 PM.  Always use your most recent med list.               amLODipine 5 MG tablet  Commonly known as:  NORVASC  Take 1 tablet (5 mg total) by mouth daily.     atorvastatin 20 MG tablet  Commonly known as:  LIPITOR  Take 1 tablet (20  mg total) by mouth daily at 6 PM.     cephALEXin 250 MG capsule  Commonly known as:  KEFLEX  Take 1 capsule (250 mg total) by mouth 4 (four) times daily.     irbesartan 300 MG tablet  Commonly known as:  AVAPRO  Take 1 tablet (300 mg total) by mouth daily.     metoprolol tartrate 25 MG tablet  Commonly known as:  LOPRESSOR  Take 1 tablet (25 mg total) by mouth 2 (two) times daily.     PROCEL PO  Take 2 scoop by mouth 2 (two) times daily.     Rivaroxaban 15 MG Tabs tablet  Commonly known as:  XARELTO  take 1 tablet by mouth once daily WITH SUPPER         Physical Exam Filed Vitals:   03/04/15 1623  BP: 133/80  Pulse: 71   Temp: 97 F (36.1 C)  Resp: 18  SpO2: 99%    General- elderly male, well built, in no acute distress Head- normocephalic, atraumatic Nose- normal nasal mucosa Throat- moist mucus membrane  Eyes- PERRLA, EOMI, no pallor, no icterus, no discharge, normal conjunctiva, normal sclera Neck- no cervical lymphadenopathy Cardiovascular- normal s1,s2, no murmurs, palpable dorsalis pedis and radial pulses, trace leg edema Respiratory- bilateral clear to auscultation, no wheeze, no rhonchi, no crackles, no use of accessory muscles Abdomen- bowel sounds present, soft, non tender Musculoskeletal- able to move all 4 extremities, generalized weakness Neurological- no focal deficit, alert and oriented Skin- warm and dry Psychiatry- normal mood and affect    Labs reviewed: Basic Metabolic Panel:  Recent Labs  02/26/15 1912  02/27/15 1810 02/28/15 0617 03/01/15 0607  NA 148*  < > 143 139 141  K 3.9  < > 3.7 3.6 4.3  CL 114*  < > 107 106 108  CO2 26  < > 30 27 27   GLUCOSE 119*  < > 118* 110* 103*  BUN 18  < > 17 13 15   CREATININE 1.26*  < > 1.22 1.16 1.05  CALCIUM 10.3  < > 9.8 9.4 9.7  MG 1.9  --   --   --  1.8  PHOS 2.6  --   --   --   --   < > = values in this interval not displayed. Liver Function Tests:  Recent Labs  07/09/14 1306 02/26/15 1912  AST 25 28  ALT 16 19  ALKPHOS 61 51  BILITOT 0.9 1.1  PROT 6.8 6.9  ALBUMIN 3.3* 3.0*   No results for input(s): LIPASE, AMYLASE in the last 8760 hours. No results for input(s): AMMONIA in the last 8760 hours. CBC:  Recent Labs  07/09/14 1306 02/26/15 1912  02/27/15 1950 02/28/15 0617 03/01/15 0607  WBC 9.2 9.4  < > 9.0 8.5 8.2  NEUTROABS 7.0 7.1  --   --   --   --   HGB 12.8* 11.9*  < > 11.5* 10.1* 10.7*  HCT 38.7* 37.4*  < > 36.5* 31.0* 34.0*  MCV 92.1 97.9  < > 96.1 93.9 93.7  PLT 97* 169  < > 169 157 184  < > = values in this interval not displayed. Cardiac Enzymes: No results for input(s): CKTOTAL, CKMB,  CKMBINDEX, TROPONINI in the last 8760 hours. BNP: Invalid input(s): POCBNP CBG:  Recent Labs  02/27/15 0756  GLUCAP 107*    Radiological Exams: Ct Head Wo Contrast  02/27/2015  CLINICAL DATA:  Initial evaluation for worsening weakness and instability with  staining for past 4-5 days. EXAM: CT HEAD WITHOUT CONTRAST TECHNIQUE: Contiguous axial images were obtained from the base of the skull through the vertex without intravenous contrast. COMPARISON:  Prior CT from 07/09/2014. FINDINGS: Moderate diffuse cerebral atrophy is present, overall not significantly changed relative to previous exam. Extensive hypodensity within the periventricular and deep white matter of both cerebral hemispheres most consistent with chronic small vessel ischemic disease, moderate to advanced in nature. Remote lacunar infarct present within the right thalamus. Additional large remote right cerebellar infarct, with smaller left cerebellar infarcts. No evidence for new acute large vessel territory infarct. No acute intracranial hemorrhage. Partially calcified meningioma along the left sphenoid wing is not significantly changed measuring 2.5 x 2.6 cm on today's study. No significant edema. No other mass lesion. Ventricular prominence related to global parenchymal volume loss present without hydrocephalus. No extra-axial fluid collection. Scalp soft tissues within normal limits. No acute abnormality about the orbits. Paranasal sinuses are clear. Remote defect present at the left lamina papyracea. No significant mastoid effusion. Calvarium intact. IMPRESSION: 1. No acute intracranial process. 2. Stable left sphenoid wing meningioma without associated edema. 3. Advanced age-related cerebral atrophy with chronic small vessel ischemic disease and multiple remote infarcts as above, stable. Electronically Signed   By: Jeannine Boga M.D.   On: 02/27/2015 01:06     Assessment/Plan  Physical deconditioning Will have him work  with physical therapy and occupational therapy team to help with gait training and muscle strengthening exercises.fall precautions. Skin care. Encourage to be out of bed.   E.coli UTI Currently asymptomatic. Maintain hydration. continue Keflex 250 mg qid for total 2 weeks  HTN Stable, monitor BP, continue amlodipine 5 mg daily, irbesartan 150 mg daily and metoprolol tartrate 25 mg bid for now  Hypernatremia S/p D5W in hospital, monitor BMP  Anemia of chronic disease Monitor cbc  Protein calorie malnutrition Monitor po intake, dietary consult and monitor weight. Continue procel supplement  DVT  S/p IVC filter placed a year ago. continue Xarelto 15 mg daily  Hyperlipidemia  Continue Atorvastatin 20 mg daily   Goals of care: short term rehabilitation   Labs/tests ordered: cbc, cmp  Family/ staff Communication: reviewed care plan with patient and nursing supervisor    Blanchie Serve, MD  [facility] [phone] (Monday-Friday 8 am - 5 pm) [phone] (afterhours)

## 2015-03-04 NOTE — Telephone Encounter (Signed)
Daughter notified. PCP completed letter and is available for pick up or mailing.  Daughter, [name], requesting letter to be left for pick up.  Letter at front office.

## 2015-03-06 LAB — CULTURE, BLOOD (ROUTINE X 2)
Culture: NO GROWTH
Culture: NO GROWTH

## 2015-03-18 ENCOUNTER — Encounter: Payer: Self-pay | Admitting: Adult Health

## 2015-03-18 ENCOUNTER — Non-Acute Institutional Stay (SKILLED_NURSING_FACILITY): Payer: Medicare Other | Admitting: Adult Health

## 2015-03-18 DIAGNOSIS — D638 Anemia in other chronic diseases classified elsewhere: Secondary | ICD-10-CM

## 2015-03-18 DIAGNOSIS — I82509 Chronic embolism and thrombosis of unspecified deep veins of unspecified lower extremity: Secondary | ICD-10-CM

## 2015-03-18 DIAGNOSIS — R5381 Other malaise: Secondary | ICD-10-CM

## 2015-03-18 DIAGNOSIS — E43 Unspecified severe protein-calorie malnutrition: Secondary | ICD-10-CM

## 2015-03-18 DIAGNOSIS — E785 Hyperlipidemia, unspecified: Secondary | ICD-10-CM

## 2015-03-18 DIAGNOSIS — I10 Essential (primary) hypertension: Secondary | ICD-10-CM | POA: Diagnosis not present

## 2015-03-18 NOTE — Progress Notes (Signed)
Patient ID: [name], male   DOB: [birth year], 82 y.o.   MRN: [id number]    DATE:  03/18/15  MRN:  [id number]  BIRTHDAY: 1924-09-06  Facility:  [facility] Home Location:  [place] Room Number: [id number]  LEVEL OF CARE:  SNF [phone])  Contact Information    Name Relation Home Work Mobile   [place] Grandaughter   [phone]   [place] Daughter [phone]       Chief Complaint  Patient presents with  . Discharge Note    Physical deconditioning, Anemia, DVT, hyperlipidemia and protein calorie malnutrition    HISTORY OF PRESENT ILLNESS:  This is an 82 year old male who is for discharge home with home health PT, OT, CNA and nursing. He has been admitted to [facility] on 03/01/15 from [facility]. He has PMH of cerebellar stroke, hypertension, DVT on Xarelto, BPH S/P TURP. He was having complaints of worsening weakness, instability and hematuria. He suffered a cerebellar stroke in 2013, although he has gait instability, he is able to walk with walker. He is on Xarelto for recurrent DVTs. No signs of CVA on CT head. He was diagnosed with UTI - urine culture grew E. Coli. He was started on Ceftriaxone X 3 days and transitioned to Keflex for a total course of 14 days. UTI is now resolved.  Patient was admitted to this facility for short-term rehabilitation after the patient's recent hospitalization.  Patient has completed SNF rehabilitation and therapy has cleared the patient for discharge.  PAST MEDICAL HISTORY:  Past Medical History  Diagnosis Date  . Hypertension   . Difficulty hearing, right     BILATERAL HEARING LOSS - BEST TO TRY TO SPEAK INTO LEFT EAR  . Meningioma ([place])   . Frequent falls   . BPH (benign prostatic hypertrophy)     TURP 05/19/13  . Chronic kidney disease     CHRONIC KIDNEY DISEASE, 2  . Anemia   . Thrombocytopenia ([place])   . Stroke ([place])     Cerebellar, 2013; WALKS WITH  WALKER, ABLE TO DRESS AND BATHE HIMSELF BUT FAMILY TRIES TO PROVIDE SUPERVISION BECAUSE OF HIS HX OF FALL AND WEAKNESS LEGS, ARMS   . Incontinence of urine     SOME INCONTINENCE  . Hyperlipidemia   . DVT (deep venous thrombosis) ([facility])      CURRENT MEDICATIONS: Reviewed  Patient's Medications  New Prescriptions   No medications on file  Previous Medications   AMLODIPINE (NORVASC) 5 MG TABLET    Take 1 tablet (5 mg total) by mouth daily.   ATORVASTATIN (LIPITOR) 20 MG TABLET    Take 1 tablet (20 mg total) by mouth daily at 6 PM.   IRBESARTAN (AVAPRO) 300 MG TABLET    Take 1 tablet (300 mg total) by mouth daily.   METOPROLOL TARTRATE (LOPRESSOR) 25 MG TABLET    Take 1 tablet (25 mg total) by mouth 2 (two) times daily.   PROTEIN (PROCEL PO)    Take 2 scoop by mouth 2 (two) times daily.   RIVAROXABAN (XARELTO) 15 MG TABS TABLET    take 1 tablet by mouth once daily WITH SUPPER  Modified Medications   No medications on file  Discontinued Medications   CEPHALEXIN (KEFLEX) 250 MG CAPSULE    Take 1 capsule (250 mg total) by mouth 4 (four) times daily.     No Known Allergies   REVIEW OF SYSTEMS:  GENERAL: no change in appetite, no  fatigue, no weight changes, no fever, chills or weakness EYES: Denies change in vision, dry eyes, eye pain, itching or discharge EARS: Denies change in hearing, ringing in ears, or earache NOSE: Denies nasal congestion or epistaxis MOUTH and THROAT: Denies oral discomfort, gingival pain or bleeding, pain from teeth or hoarseness   RESPIRATORY: no cough, SOB, DOE, wheezing, hemoptysis CARDIAC: no chest pain, or palpitations GI: no abdominal pain, diarrhea, constipation, heart burn, nausea or vomiting GU: Denies dysuria, frequency, hematuria, incontinence, or discharge PSYCHIATRIC: Denies feeling of depression or anxiety. No report of hallucinations, insomnia, paranoia, or agitation   PHYSICAL EXAMINATION  GENERAL APPEARANCE: Well nourished. In no acute  distress. Normal body habitus HEAD: Normal in size and contour. No evidence of trauma EYES: Lids open and close normally. No blepharitis, entropion or ectropion. PERRL. Conjunctivae are clear and sclerae are white. Lenses are without opacity EARS: Pinnae are normal. Patient hears normal voice tunes of the examiner MOUTH and THROAT: Lips are without lesions. Oral mucosa is moist and without lesions. Tongue is normal in shape, size, and color and without lesions NECK: supple, trachea midline, no neck masses, no thyroid tenderness, no thyromegaly LYMPHATICS: no LAN in the neck, no supraclavicular LAN RESPIRATORY: breathing is even & unlabored, BS CTAB CARDIAC: RRR, no murmur,no extra heart sounds, BLE edema 1+ GI: abdomen soft, normal BS, no masses, no tenderness, no hepatomegaly, no splenomegaly GU:  Has foley catheter draining to urine bag with dark orange colored urine EXTREMITIES:  Able to move X 4 extremities NEURO:  Slurred speech PSYCHIATRIC: Alert and oriented X 3. Affect and behavior are appropriate  LABS/RADIOLOGY: Labs reviewed: 03/09/15  WBC 7.9 hemoglobin 10.5 hematocrit 33.7 MCV 95.5 platelet 234 sodium 143 potassium 3.9 glucose 107 BUN 16 creatinine 0.97 calcium 9.5 total protein 5.7 albumin 3.13 total bilirubin 0.49 alkaline phosphatase 61 SGOT 19 SGPT 18 03/03/15  WBC 7.1 hemoglobin 10.8 hematocrit 33.9 MCV 94.0 platelet 210 sodium 145 potassium 4.5 glucose 90 BUN 19 creatinine 1.18 calcium [id number] Basic Metabolic Panel:  Recent Labs  02/26/15 1912  02/27/15 1810 02/28/15 0617 03/01/15 0607  NA 148*  < > 143 139 141  K 3.9  < > 3.7 3.6 4.3  CL 114*  < > 107 106 108  CO2 26  < > 30 27 27   GLUCOSE 119*  < > 118* 110* 103*  BUN 18  < > 17 13 15   CREATININE 1.26*  < > 1.22 1.16 1.05  CALCIUM 10.3  < > 9.8 9.4 9.7  MG 1.9  --   --   --  1.8  PHOS 2.6  --   --   --   --   < > = values in this interval not displayed. Liver Function Tests:  Recent Labs  07/09/14 1306  02/26/15 1912  AST 25 28  ALT 16 19  ALKPHOS 61 51  BILITOT 0.9 1.1  PROT 6.8 6.9  ALBUMIN 3.3* 3.0*   CBC:  Recent Labs  07/09/14 1306 02/26/15 1912  02/27/15 1950 02/28/15 0617 03/01/15 0607  WBC 9.2 9.4  < > 9.0 8.5 8.2  NEUTROABS 7.0 7.1  --   --   --   --   HGB 12.8* 11.9*  < > 11.5* 10.1* 10.7*  HCT 38.7* 37.4*  < > 36.5* 31.0* 34.0*  MCV 92.1 97.9  < > 96.1 93.9 93.7  PLT 97* 169  < > 169 157 184  < > = values in this interval not displayed.  CBG:  Recent Labs  02/27/15 0756  GLUCAP 107*    Ct Head Wo Contrast  02/27/2015  CLINICAL DATA:  Initial evaluation for worsening weakness and instability with staining for past 4-5 days. EXAM: CT HEAD WITHOUT CONTRAST TECHNIQUE: Contiguous axial images were obtained from the base of the skull through the vertex without intravenous contrast. COMPARISON:  Prior CT from 07/09/2014. FINDINGS: Moderate diffuse cerebral atrophy is present, overall not significantly changed relative to previous exam. Extensive hypodensity within the periventricular and deep white matter of both cerebral hemispheres most consistent with chronic small vessel ischemic disease, moderate to advanced in nature. Remote lacunar infarct present within the right thalamus. Additional large remote right cerebellar infarct, with smaller left cerebellar infarcts. No evidence for new acute large vessel territory infarct. No acute intracranial hemorrhage. Partially calcified meningioma along the left sphenoid wing is not significantly changed measuring 2.5 x 2.6 cm on today's study. No significant edema. No other mass lesion. Ventricular prominence related to global parenchymal volume loss present without hydrocephalus. No extra-axial fluid collection. Scalp soft tissues within normal limits. No acute abnormality about the orbits. Paranasal sinuses are clear. Remote defect present at the left lamina papyracea. No significant mastoid effusion. Calvarium intact.  IMPRESSION: 1. No acute intracranial process. 2. Stable left sphenoid wing meningioma without associated edema. 3. Advanced age-related cerebral atrophy with chronic small vessel ischemic disease and multiple remote infarcts as above, stable. Electronically Signed   By: Jeannine Boga M.D.   On: 02/27/2015 01:06    ASSESSMENT/PLAN:  Physical deconditioning - for home health PT, OT, CNA and Nursing  UTI - Resolved  Anemia of chronic disease - hgb 10.7; recheck 10.5, stable  HTN - continue Metoprolol tartrate 25 mg BID, Irbesartan 150 mg daily and Amlodipine 5 mg daily  DVT - has IVC filter placed a year ago; continue Xarelto 15 mg daily  Hyperlipidemia -  Continue Atorvastatin 20 mg daily  Protein-calorie malnutrition, severe - albumin 3.0; continue Procel 2 scoops BID     I have filled out patient's discharge paperwork and written prescriptions.  Patient will receive home health PT, OT, Nursing and CNA.  Total discharge time: Greater than 30 minutes  Discharge time involved coordination of the discharge process with social worker, nursing staff and therapy department. Medical justification for home health services verified.    [facility], NP [employer] [phone]

## 2015-03-19 ENCOUNTER — Telehealth: Payer: Self-pay

## 2015-03-19 NOTE — Telephone Encounter (Signed)
Pt is discharging form [facility] today- they have recommended HH RN, PT, OT, and aid for patient at home.  Is VO for the initial eval (Tuesday next week) OK with you?  If so, they will call back with there formal treatment requests.

## 2015-03-19 NOTE — Telephone Encounter (Signed)
I think that should be fine.  [place] usually has good recommendations.  Thanks.

## 2015-03-22 ENCOUNTER — Encounter: Payer: Self-pay | Admitting: *Deleted

## 2015-03-22 DIAGNOSIS — I69328 Other speech and language deficits following cerebral infarction: Secondary | ICD-10-CM | POA: Diagnosis not present

## 2015-03-22 DIAGNOSIS — I10 Essential (primary) hypertension: Secondary | ICD-10-CM | POA: Diagnosis not present

## 2015-03-22 DIAGNOSIS — I69393 Ataxia following cerebral infarction: Secondary | ICD-10-CM | POA: Diagnosis not present

## 2015-03-22 DIAGNOSIS — I69351 Hemiplegia and hemiparesis following cerebral infarction affecting right dominant side: Secondary | ICD-10-CM | POA: Diagnosis not present

## 2015-03-23 DIAGNOSIS — I69351 Hemiplegia and hemiparesis following cerebral infarction affecting right dominant side: Secondary | ICD-10-CM | POA: Diagnosis not present

## 2015-03-23 DIAGNOSIS — I69393 Ataxia following cerebral infarction: Secondary | ICD-10-CM | POA: Diagnosis not present

## 2015-03-23 DIAGNOSIS — I69328 Other speech and language deficits following cerebral infarction: Secondary | ICD-10-CM | POA: Diagnosis not present

## 2015-03-23 DIAGNOSIS — I10 Essential (primary) hypertension: Secondary | ICD-10-CM | POA: Diagnosis not present

## 2015-03-25 ENCOUNTER — Telehealth: Payer: Self-pay | Admitting: Internal Medicine

## 2015-03-25 DIAGNOSIS — I10 Essential (primary) hypertension: Secondary | ICD-10-CM | POA: Diagnosis not present

## 2015-03-25 DIAGNOSIS — I69328 Other speech and language deficits following cerebral infarction: Secondary | ICD-10-CM | POA: Diagnosis not present

## 2015-03-25 DIAGNOSIS — I69393 Ataxia following cerebral infarction: Secondary | ICD-10-CM | POA: Diagnosis not present

## 2015-03-25 DIAGNOSIS — I69351 Hemiplegia and hemiparesis following cerebral infarction affecting right dominant side: Secondary | ICD-10-CM | POA: Diagnosis not present

## 2015-03-25 NOTE — Telephone Encounter (Signed)
Calling wanting orders 1 x week for 2 week and 2 x for 1 week for adl tansfers exercise instructions in pain control prevention of pressure ulcers and okay to take O2 saturation levels. Okay to discharges when goals met or when at max potential  °

## 2015-03-25 NOTE — Telephone Encounter (Signed)
Dr. Heber [place], VO for OT OK as follows?  2x week for 1 week and then 1x week for 2 weeks?  Discharge when goals complete?  OT to focus on ADLs, and exercise to help with pain.

## 2015-03-28 NOTE — Telephone Encounter (Signed)
Leigh, that sounds great.  Agree with OT plan as outlined.

## 2015-03-30 DIAGNOSIS — I10 Essential (primary) hypertension: Secondary | ICD-10-CM | POA: Diagnosis not present

## 2015-03-30 DIAGNOSIS — I69351 Hemiplegia and hemiparesis following cerebral infarction affecting right dominant side: Secondary | ICD-10-CM | POA: Diagnosis not present

## 2015-03-30 DIAGNOSIS — I69393 Ataxia following cerebral infarction: Secondary | ICD-10-CM | POA: Diagnosis not present

## 2015-03-30 DIAGNOSIS — I69328 Other speech and language deficits following cerebral infarction: Secondary | ICD-10-CM | POA: Diagnosis not present

## 2015-03-30 NOTE — Telephone Encounter (Signed)
VO given to [facility]

## 2015-03-31 DIAGNOSIS — I69351 Hemiplegia and hemiparesis following cerebral infarction affecting right dominant side: Secondary | ICD-10-CM | POA: Diagnosis not present

## 2015-03-31 DIAGNOSIS — I10 Essential (primary) hypertension: Secondary | ICD-10-CM | POA: Diagnosis not present

## 2015-03-31 DIAGNOSIS — I69328 Other speech and language deficits following cerebral infarction: Secondary | ICD-10-CM | POA: Diagnosis not present

## 2015-03-31 DIAGNOSIS — I69393 Ataxia following cerebral infarction: Secondary | ICD-10-CM | POA: Diagnosis not present

## 2015-04-01 DIAGNOSIS — I10 Essential (primary) hypertension: Secondary | ICD-10-CM | POA: Diagnosis not present

## 2015-04-01 DIAGNOSIS — I69351 Hemiplegia and hemiparesis following cerebral infarction affecting right dominant side: Secondary | ICD-10-CM | POA: Diagnosis not present

## 2015-04-01 DIAGNOSIS — I69328 Other speech and language deficits following cerebral infarction: Secondary | ICD-10-CM | POA: Diagnosis not present

## 2015-04-01 DIAGNOSIS — I69393 Ataxia following cerebral infarction: Secondary | ICD-10-CM | POA: Diagnosis not present

## 2015-04-02 DIAGNOSIS — I69393 Ataxia following cerebral infarction: Secondary | ICD-10-CM | POA: Diagnosis not present

## 2015-04-02 DIAGNOSIS — I10 Essential (primary) hypertension: Secondary | ICD-10-CM | POA: Diagnosis not present

## 2015-04-02 DIAGNOSIS — I69328 Other speech and language deficits following cerebral infarction: Secondary | ICD-10-CM | POA: Diagnosis not present

## 2015-04-02 DIAGNOSIS — I69351 Hemiplegia and hemiparesis following cerebral infarction affecting right dominant side: Secondary | ICD-10-CM | POA: Diagnosis not present

## 2015-04-05 DIAGNOSIS — I10 Essential (primary) hypertension: Secondary | ICD-10-CM | POA: Diagnosis not present

## 2015-04-05 DIAGNOSIS — I69393 Ataxia following cerebral infarction: Secondary | ICD-10-CM | POA: Diagnosis not present

## 2015-04-05 DIAGNOSIS — I69328 Other speech and language deficits following cerebral infarction: Secondary | ICD-10-CM | POA: Diagnosis not present

## 2015-04-05 DIAGNOSIS — I69351 Hemiplegia and hemiparesis following cerebral infarction affecting right dominant side: Secondary | ICD-10-CM | POA: Diagnosis not present

## 2015-04-07 DIAGNOSIS — I69351 Hemiplegia and hemiparesis following cerebral infarction affecting right dominant side: Secondary | ICD-10-CM | POA: Diagnosis not present

## 2015-04-07 DIAGNOSIS — I69393 Ataxia following cerebral infarction: Secondary | ICD-10-CM | POA: Diagnosis not present

## 2015-04-07 DIAGNOSIS — I69328 Other speech and language deficits following cerebral infarction: Secondary | ICD-10-CM | POA: Diagnosis not present

## 2015-04-07 DIAGNOSIS — I10 Essential (primary) hypertension: Secondary | ICD-10-CM | POA: Diagnosis not present

## 2015-04-09 DIAGNOSIS — I69351 Hemiplegia and hemiparesis following cerebral infarction affecting right dominant side: Secondary | ICD-10-CM | POA: Diagnosis not present

## 2015-04-09 DIAGNOSIS — I69328 Other speech and language deficits following cerebral infarction: Secondary | ICD-10-CM | POA: Diagnosis not present

## 2015-04-09 DIAGNOSIS — I10 Essential (primary) hypertension: Secondary | ICD-10-CM | POA: Diagnosis not present

## 2015-04-09 DIAGNOSIS — I69393 Ataxia following cerebral infarction: Secondary | ICD-10-CM | POA: Diagnosis not present

## 2015-04-12 DIAGNOSIS — I10 Essential (primary) hypertension: Secondary | ICD-10-CM | POA: Diagnosis not present

## 2015-04-12 DIAGNOSIS — I69351 Hemiplegia and hemiparesis following cerebral infarction affecting right dominant side: Secondary | ICD-10-CM | POA: Diagnosis not present

## 2015-04-12 DIAGNOSIS — I69328 Other speech and language deficits following cerebral infarction: Secondary | ICD-10-CM | POA: Diagnosis not present

## 2015-04-12 DIAGNOSIS — I69393 Ataxia following cerebral infarction: Secondary | ICD-10-CM | POA: Diagnosis not present

## 2015-04-14 DIAGNOSIS — I10 Essential (primary) hypertension: Secondary | ICD-10-CM | POA: Diagnosis not present

## 2015-04-14 DIAGNOSIS — I69393 Ataxia following cerebral infarction: Secondary | ICD-10-CM | POA: Diagnosis not present

## 2015-04-14 DIAGNOSIS — I69328 Other speech and language deficits following cerebral infarction: Secondary | ICD-10-CM | POA: Diagnosis not present

## 2015-04-14 DIAGNOSIS — I82509 Chronic embolism and thrombosis of unspecified deep veins of unspecified lower extremity: Secondary | ICD-10-CM | POA: Diagnosis not present

## 2015-04-14 DIAGNOSIS — I69351 Hemiplegia and hemiparesis following cerebral infarction affecting right dominant side: Secondary | ICD-10-CM | POA: Diagnosis not present

## 2015-04-15 DIAGNOSIS — I69328 Other speech and language deficits following cerebral infarction: Secondary | ICD-10-CM | POA: Diagnosis not present

## 2015-04-15 DIAGNOSIS — I69393 Ataxia following cerebral infarction: Secondary | ICD-10-CM | POA: Diagnosis not present

## 2015-04-15 DIAGNOSIS — I10 Essential (primary) hypertension: Secondary | ICD-10-CM | POA: Diagnosis not present

## 2015-04-15 DIAGNOSIS — I82509 Chronic embolism and thrombosis of unspecified deep veins of unspecified lower extremity: Secondary | ICD-10-CM | POA: Diagnosis not present

## 2015-04-15 DIAGNOSIS — I69351 Hemiplegia and hemiparesis following cerebral infarction affecting right dominant side: Secondary | ICD-10-CM | POA: Diagnosis not present

## 2015-04-19 ENCOUNTER — Ambulatory Visit: Payer: Medicare Other | Admitting: Family Medicine

## 2015-04-19 DIAGNOSIS — I82509 Chronic embolism and thrombosis of unspecified deep veins of unspecified lower extremity: Secondary | ICD-10-CM | POA: Diagnosis not present

## 2015-04-19 DIAGNOSIS — I69328 Other speech and language deficits following cerebral infarction: Secondary | ICD-10-CM | POA: Diagnosis not present

## 2015-04-19 DIAGNOSIS — I10 Essential (primary) hypertension: Secondary | ICD-10-CM | POA: Diagnosis not present

## 2015-04-19 DIAGNOSIS — I69393 Ataxia following cerebral infarction: Secondary | ICD-10-CM | POA: Diagnosis not present

## 2015-04-19 DIAGNOSIS — I69351 Hemiplegia and hemiparesis following cerebral infarction affecting right dominant side: Secondary | ICD-10-CM | POA: Diagnosis not present

## 2015-04-22 DIAGNOSIS — I69328 Other speech and language deficits following cerebral infarction: Secondary | ICD-10-CM | POA: Diagnosis not present

## 2015-04-22 DIAGNOSIS — I69393 Ataxia following cerebral infarction: Secondary | ICD-10-CM | POA: Diagnosis not present

## 2015-04-22 DIAGNOSIS — I10 Essential (primary) hypertension: Secondary | ICD-10-CM | POA: Diagnosis not present

## 2015-04-22 DIAGNOSIS — I82509 Chronic embolism and thrombosis of unspecified deep veins of unspecified lower extremity: Secondary | ICD-10-CM | POA: Diagnosis not present

## 2015-04-22 DIAGNOSIS — I69351 Hemiplegia and hemiparesis following cerebral infarction affecting right dominant side: Secondary | ICD-10-CM | POA: Diagnosis not present

## 2015-04-26 DIAGNOSIS — I10 Essential (primary) hypertension: Secondary | ICD-10-CM | POA: Diagnosis not present

## 2015-04-26 DIAGNOSIS — I69328 Other speech and language deficits following cerebral infarction: Secondary | ICD-10-CM | POA: Diagnosis not present

## 2015-04-26 DIAGNOSIS — I69393 Ataxia following cerebral infarction: Secondary | ICD-10-CM | POA: Diagnosis not present

## 2015-04-26 DIAGNOSIS — I82509 Chronic embolism and thrombosis of unspecified deep veins of unspecified lower extremity: Secondary | ICD-10-CM | POA: Diagnosis not present

## 2015-04-26 DIAGNOSIS — I69351 Hemiplegia and hemiparesis following cerebral infarction affecting right dominant side: Secondary | ICD-10-CM | POA: Diagnosis not present

## 2015-04-28 DIAGNOSIS — I82509 Chronic embolism and thrombosis of unspecified deep veins of unspecified lower extremity: Secondary | ICD-10-CM | POA: Diagnosis not present

## 2015-04-28 DIAGNOSIS — I10 Essential (primary) hypertension: Secondary | ICD-10-CM | POA: Diagnosis not present

## 2015-04-28 DIAGNOSIS — I69351 Hemiplegia and hemiparesis following cerebral infarction affecting right dominant side: Secondary | ICD-10-CM | POA: Diagnosis not present

## 2015-04-28 DIAGNOSIS — I69328 Other speech and language deficits following cerebral infarction: Secondary | ICD-10-CM | POA: Diagnosis not present

## 2015-04-28 DIAGNOSIS — I69393 Ataxia following cerebral infarction: Secondary | ICD-10-CM | POA: Diagnosis not present

## 2015-06-03 ENCOUNTER — Other Ambulatory Visit: Payer: Self-pay | Admitting: *Deleted

## 2015-06-03 MED ORDER — AMLODIPINE BESYLATE 5 MG PO TABS: 5.0000 mg | ORAL_TABLET | Freq: Every day | ORAL | Status: DC

## 2015-06-03 NOTE — Telephone Encounter (Signed)
Pt is out °

## 2015-06-03 NOTE — Telephone Encounter (Signed)
What is he out of?

## 2015-06-04 ENCOUNTER — Telehealth: Payer: Self-pay | Admitting: Internal Medicine

## 2015-06-04 NOTE — Telephone Encounter (Signed)
APPT. REMINDER CALL, LMTCB °

## 2015-06-07 ENCOUNTER — Encounter: Payer: Self-pay | Admitting: Internal Medicine

## 2015-06-07 ENCOUNTER — Ambulatory Visit (INDEPENDENT_AMBULATORY_CARE_PROVIDER_SITE_OTHER): Payer: Medicare Other | Admitting: Internal Medicine

## 2015-06-07 VITALS — BP 144/84 | HR 63 | Temp 98.2°F | Ht 69.0 in | Wt 175.0 lb

## 2015-06-07 DIAGNOSIS — I10 Essential (primary) hypertension: Secondary | ICD-10-CM | POA: Diagnosis not present

## 2015-06-07 DIAGNOSIS — Z79899 Other long term (current) drug therapy: Secondary | ICD-10-CM

## 2015-06-07 DIAGNOSIS — R5381 Other malaise: Secondary | ICD-10-CM

## 2015-06-07 DIAGNOSIS — Z86718 Personal history of other venous thrombosis and embolism: Secondary | ICD-10-CM

## 2015-06-07 DIAGNOSIS — Z86711 Personal history of pulmonary embolism: Secondary | ICD-10-CM

## 2015-06-07 DIAGNOSIS — Z7901 Long term (current) use of anticoagulants: Secondary | ICD-10-CM

## 2015-06-07 DIAGNOSIS — B351 Tinea unguium: Secondary | ICD-10-CM | POA: Diagnosis not present

## 2015-06-07 MED ORDER — ATORVASTATIN CALCIUM 20 MG PO TABS: 20.0000 mg | ORAL_TABLET | Freq: Every day | ORAL | Status: DC

## 2015-06-07 NOTE — Assessment & Plan Note (Signed)
A: Physical deconditioning  P: He has not returned to an independent functional level but is managing very well with the assistance of his daughter and assistive devices.  He is able to move around the house, call for help and get things like water when needed.

## 2015-06-07 NOTE — Progress Notes (Signed)
[facility] Subjective:   Patient ID: [name] male   DOB: 08/13/1924 82 y.o.   MRN: [id number]  HPI: [name] is a 82 y.o. male with a PMH detailed below who presents for follow up after rehab.  He was admitted in December for weakness and found to have a UTI he subsequently went to rehab and then has had some home health rehab.  He is doing very well under the care of his daughter.  She assists him in all of his ADLs but tries to get him to do as much as possible.  She has him get out the the wheelchair and do laps around the house every day.  She puts out his clothes and sometimes has to get them started but then he is able to do the rest.  They have plans to follow up with urology.    Past Medical History  Diagnosis Date  . Hypertension   . Difficulty hearing, right     BILATERAL HEARING LOSS - BEST TO TRY TO SPEAK INTO LEFT EAR  . Meningioma ([place])   . Frequent falls   . BPH (benign prostatic hypertrophy)     TURP 05/19/13  . Chronic kidney disease     CHRONIC KIDNEY DISEASE, 2  . Anemia   . Thrombocytopenia ([place])   . Stroke ([place])     Cerebellar, 2013; WALKS WITH WALKER, ABLE TO DRESS AND BATHE HIMSELF BUT FAMILY TRIES TO PROVIDE SUPERVISION BECAUSE OF HIS HX OF FALL AND WEAKNESS LEGS, ARMS   . Incontinence of urine     SOME INCONTINENCE  . Hyperlipidemia   . DVT (deep venous thrombosis) ([place])    Current Outpatient Prescriptions  Medication Sig Dispense Refill  . amLODipine (NORVASC) 5 MG tablet Take 1 tablet (5 mg total) by mouth daily. 90 tablet 3  . atorvastatin (LIPITOR) 20 MG tablet Take 1 tablet (20 mg total) by mouth daily at 6 PM. 90 tablet 2  . irbesartan (AVAPRO) 300 MG tablet Take 1 tablet (300 mg total) by mouth daily. 90 tablet 3  . metoprolol tartrate (LOPRESSOR) 25 MG tablet Take 1 tablet (25 mg total) by mouth 2 (two) times daily. 180 tablet 3  . Protein (PROCEL PO) Take 2 scoop by mouth 2 (two) times daily.    .  Rivaroxaban (XARELTO) 15 MG TABS tablet take 1 tablet by mouth once daily WITH SUPPER 90 tablet 3   No current facility-administered medications for this visit.   Family History  Problem Relation Age of Onset  . Diabetes Mother   . Heart disease Mother   . Hypertension Mother   . Heart disease Father   . Hypertension Father   . Hypertension Daughter    Social History   Social History  . Marital Status: Widowed    Spouse Name: N/A  . Number of Children: N/A  . Years of Education: N/A   Social History Main Topics  . Smoking status: Never Smoker   . Smokeless tobacco: Never Used  . Alcohol Use: No  . Drug Use: No  . Sexual Activity: No   Other Topics Concern  . None   Social History Narrative   Lives with daughter. Ambulates w walker. Can make basic meals (cereal). Very hard of hearing.    Review of Systems: Review of Systems  Constitutional: Negative for fever.  Respiratory: Negative for cough.   Cardiovascular: Negative for chest pain.  Genitourinary: Negative for dysuria, frequency and hematuria.  Musculoskeletal: Negative for myalgias.  Endo/Heme/Allergies: Negative for polydipsia.     Objective:  Physical Exam: Filed Vitals:   06/07/15 1421  BP: 144/84  Pulse: 63  Temp: 98.2 F (36.8 C)  TempSrc: Oral  Height: 5\' 9"  (1.753 m)  Weight: 175 lb (79.379 kg)  SpO2: 100%  Physical Exam  Constitutional: He is well-developed, well-nourished, and in no distress.  Eyes: Conjunctivae are normal.  Cardiovascular: Normal rate, regular rhythm and normal heart sounds.   Pulmonary/Chest: Effort normal and breath sounds normal.  Abdominal: Soft. Bowel sounds are normal.  Musculoskeletal: He exhibits no edema.  Nursing note and vitals reviewed.   Assessment & Plan:  Case discussed with Dr. 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  He is able to move around the house, call for help and get things like water when needed.    Medications Ordered Meds ordered this encounter  Medications  . atorvastatin (LIPITOR) 20 MG tablet    Sig: Take 1 tablet (20 mg total) by mouth daily at 6 PM.    Dispense:  90 tablet    Refill:  2   Other Orders Orders Placed This Encounter  Procedures  . Ambulatory referral to Podiatry    Referral Priority:  Routine    Referral Type:  Consultation    Referral Reason:  Specialty Services Required    Requested Specialty:  Podiatry    Number of Visits Requested:  1   Follow Up: Return in about 6 months (around 12/08/2015), or if symptoms worsen or fail to improve.

## 2015-06-07 NOTE — Patient Instructions (Signed)
General Instructions:  I am referring you to the podiatrist for toenail clipping.  Please bring your medicines with you each time you come to clinic.  Medicines may include prescription medications, over-the-counter medications, herbal remedies, eye drops, vitamins, or other pills.   Progress Toward Treatment Goals:  Treatment Goal 02/11/2015  Blood pressure deteriorated    Self Care Goals & Plans:  Self Care Goal 02/11/2015  Manage my medications take my medicines as prescribed; bring my medications to every visit; refill my medications on time  Monitor my health keep track of my blood pressure  Eat healthy foods drink diet soda or water instead of juice or soda; eat more vegetables; eat foods that are low in salt; eat baked foods instead of fried foods; eat fruit for snacks and desserts  Be physically active find an activity I enjoy  Meeting treatment goals maintain the current self-care plan    No flowsheet data found.   Care Management & Community Referrals:  Referral 02/11/2015  Referrals made for care management support none needed

## 2015-06-07 NOTE — Assessment & Plan Note (Signed)
HPI: No bleeding  A: Long term use of anticoagulant therapy post recurrent DVT/PE  P: Continue Xarelto 15mg  daily

## 2015-06-07 NOTE — Assessment & Plan Note (Signed)
A: Severe onychomycosis  P: Referral to podiatry for toenail clipping

## 2015-06-07 NOTE — Assessment & Plan Note (Signed)
A: Essential HTN at goal  P: Continue Amlodipine 5mg  dialy Irbesartan 300mg  daily Metoprolol 25mg  BID

## 2015-06-09 NOTE — Progress Notes (Signed)
Internal Medicine Clinic Attending  Case discussed with Dr. Hoffman at the time of the visit.  We reviewed the resident's history and exam and pertinent patient test results.  I agree with the assessment, diagnosis, and plan of care documented in the resident's note.  

## 2015-07-12 ENCOUNTER — Ambulatory Visit: Payer: Medicare Other | Admitting: Podiatry

## 2015-07-26 ENCOUNTER — Encounter: Payer: Self-pay | Admitting: Podiatry

## 2015-07-26 ENCOUNTER — Ambulatory Visit (INDEPENDENT_AMBULATORY_CARE_PROVIDER_SITE_OTHER): Payer: Medicare Other | Admitting: Podiatry

## 2015-07-26 VITALS — BP 162/101 | HR 60 | Resp 16 | Ht 67.0 in | Wt 170.0 lb

## 2015-07-26 DIAGNOSIS — M79675 Pain in left toe(s): Secondary | ICD-10-CM

## 2015-07-26 DIAGNOSIS — M79604 Pain in right leg: Secondary | ICD-10-CM

## 2015-07-26 DIAGNOSIS — M79605 Pain in left leg: Secondary | ICD-10-CM

## 2015-07-26 DIAGNOSIS — M79674 Pain in right toe(s): Secondary | ICD-10-CM

## 2015-07-26 DIAGNOSIS — B351 Tinea unguium: Secondary | ICD-10-CM | POA: Diagnosis not present

## 2015-07-26 NOTE — Progress Notes (Signed)
   Subjective:    Patient ID: [name], male    DOB: December 29, 1924, 82 y.o.   MRN: [id number]  HPI Chief Complaint  Patient presents with  . Nail Problem    Bilateral; nail discoloration & thickened nails; x2 yrs  . Debridement    Bilateral nail trim      Review of Systems  All other systems reviewed and are negative.      Objective:   Physical Exam        Assessment & Plan:

## 2015-07-28 NOTE — Progress Notes (Signed)
Subjective:     Patient ID: [name], male   DOB: 27-Mar-1924, 82 y.o.   MRN: [id number]  HPI patient presents stating he cannot cut his toenails and he presents with caregiver Collie Siad cannot cut them either they're thick yellow brittle and the patient is in poor health and they're painful   Review of Systems  All other systems reviewed and are negative.      Objective:   Physical Exam  Constitutional: He is oriented to person, place, and time.  Neurological: He is oriented to person, place, and time.  Skin: Skin is warm and dry.  Nursing note and vitals reviewed.  patient is found to have diminishment of pulses PT and DP bilateral with dry skin formation diminished hair growth and diminished sharp Dole vibratory. All nailbeds are thick and incurvated and elongated with pain upon palpation to the dorsal surface. Patient is diminished Fill time and diminished orientation but is relatively normal for his advanced age     Assessment:     At risk patient with mycotic nail infections 1-5 both feet that are very painful and he long gaited    Plan:     H&P performed and regular care discussed with patient. Debrided nailbeds 1-5 both feet with no iatrogenic bleeding and reappoint to recheck

## 2015-08-31 ENCOUNTER — Telehealth: Payer: Self-pay

## 2015-09-01 NOTE — Telephone Encounter (Signed)
[name] is a 82 y.o. male who was contacted via telephone for monitoring of rivaroxaban (Xarelto) therapy.    ASSESSMENT Indication(s): Hx DVT Duration: indefinite  Labs:    Component Value Date/Time   AST 28 02/26/2015 1912   ALT 19 02/26/2015 1912   NA 141 03/01/2015 0607   K 4.3 03/01/2015 0607   CL 108 03/01/2015 0607   CO2 27 03/01/2015 0607   GLUCOSE 103* 03/01/2015 0607   HGBA1C 5.8* 12/19/2013 0427   BUN 15 03/01/2015 0607   CREATININE 1.05 03/01/2015 0607   CREATININE 0.96 01/15/2014 1648   CALCIUM 9.7 03/01/2015 0607   GFRNONAA >60 03/01/2015 0607   GFRNONAA 71 01/15/2014 1648   GFRAA >60 03/01/2015 0607   GFRAA 82 01/15/2014 1648   WBC 8.2 03/01/2015 0607   HGB 10.7* 03/01/2015 0607   HCT 34.0* 03/01/2015 0607   PLT 184 03/01/2015 0607    rivaroxaban (Xarelto) Dose: 15 mg daily  Safety: Patient has not had recent bleeding/thromboembolic events. Patient reports no recent signs or symptoms of bleeding, no signs of symptoms of thromboembolism. Medication changes: no.  Adherence: Patient reports no known adherence challenges. Patient does correctly recite the dose. Contacted pharmacy and records indicate refills are consistent. Last fills (3 month supply): 2/19, 5/26.  Patient Instructions: Patient advised to contact clinic or seek medical attention if signs/symptoms of bleeding or thromboembolism occur. Patient verbalized understanding by repeating back information.  Follow-up No follow up appointment scheduled.  Angelena Form PharmD Candidate  09/01/2015, 12:45 PM

## 2015-10-26 ENCOUNTER — Ambulatory Visit: Payer: Medicare Other | Admitting: Podiatry

## 2016-01-26 ENCOUNTER — Observation Stay (HOSPITAL_COMMUNITY): Payer: Medicare Other

## 2016-01-26 ENCOUNTER — Observation Stay (HOSPITAL_COMMUNITY)
Admission: AD | Admit: 2016-01-26 | Discharge: 2016-01-28 | Disposition: A | Discharge status: Skilled Nursing Facility | Payer: Medicare Other | Source: Ambulatory Visit | Attending: Student in an Organized Health Care Education/Training Program | Admitting: Student in an Organized Health Care Education/Training Program

## 2016-01-26 ENCOUNTER — Encounter (HOSPITAL_COMMUNITY): Payer: Self-pay | Admitting: Internal Medicine

## 2016-01-26 ENCOUNTER — Ambulatory Visit: Payer: Medicare Other | Admitting: Internal Medicine

## 2016-01-26 VITALS — BP 174/90 | HR 69 | Temp 99.7°F | Ht 72.0 in | Wt 179.4 lb

## 2016-01-26 DIAGNOSIS — N138 Other obstructive and reflux uropathy: Secondary | ICD-10-CM | POA: Diagnosis not present

## 2016-01-26 DIAGNOSIS — R531 Weakness: Secondary | ICD-10-CM

## 2016-01-26 DIAGNOSIS — R32 Unspecified urinary incontinence: Secondary | ICD-10-CM | POA: Diagnosis not present

## 2016-01-26 DIAGNOSIS — Z87440 Personal history of urinary (tract) infections: Secondary | ICD-10-CM | POA: Insufficient documentation

## 2016-01-26 DIAGNOSIS — Z79899 Other long term (current) drug therapy: Secondary | ICD-10-CM | POA: Diagnosis not present

## 2016-01-26 DIAGNOSIS — R059 Cough, unspecified: Secondary | ICD-10-CM

## 2016-01-26 DIAGNOSIS — R338 Other retention of urine: Secondary | ICD-10-CM | POA: Diagnosis not present

## 2016-01-26 DIAGNOSIS — I129 Hypertensive chronic kidney disease with stage 1 through stage 4 chronic kidney disease, or unspecified chronic kidney disease: Secondary | ICD-10-CM | POA: Insufficient documentation

## 2016-01-26 DIAGNOSIS — I69319 Unspecified symptoms and signs involving cognitive functions following cerebral infarction: Secondary | ICD-10-CM | POA: Insufficient documentation

## 2016-01-26 DIAGNOSIS — Z7901 Long term (current) use of anticoagulants: Secondary | ICD-10-CM | POA: Diagnosis not present

## 2016-01-26 DIAGNOSIS — N390 Urinary tract infection, site not specified: Secondary | ICD-10-CM | POA: Diagnosis not present

## 2016-01-26 DIAGNOSIS — N401 Enlarged prostate with lower urinary tract symptoms: Secondary | ICD-10-CM | POA: Insufficient documentation

## 2016-01-26 DIAGNOSIS — I6782 Cerebral ischemia: Secondary | ICD-10-CM | POA: Insufficient documentation

## 2016-01-26 DIAGNOSIS — F0390 Unspecified dementia without behavioral disturbance: Secondary | ICD-10-CM

## 2016-01-26 DIAGNOSIS — I69351 Hemiplegia and hemiparesis following cerebral infarction affecting right dominant side: Secondary | ICD-10-CM | POA: Diagnosis not present

## 2016-01-26 DIAGNOSIS — N183 Chronic kidney disease, stage 3 unspecified: Secondary | ICD-10-CM | POA: Diagnosis present

## 2016-01-26 DIAGNOSIS — R2681 Unsteadiness on feet: Secondary | ICD-10-CM

## 2016-01-26 DIAGNOSIS — B9620 Unspecified Escherichia coli [E. coli] as the cause of diseases classified elsewhere: Secondary | ICD-10-CM | POA: Diagnosis not present

## 2016-01-26 DIAGNOSIS — H9190 Unspecified hearing loss, unspecified ear: Secondary | ICD-10-CM

## 2016-01-26 DIAGNOSIS — Z86718 Personal history of other venous thrombosis and embolism: Secondary | ICD-10-CM | POA: Diagnosis not present

## 2016-01-26 DIAGNOSIS — E785 Hyperlipidemia, unspecified: Secondary | ICD-10-CM | POA: Diagnosis not present

## 2016-01-26 DIAGNOSIS — I10 Essential (primary) hypertension: Secondary | ICD-10-CM | POA: Diagnosis present

## 2016-01-26 DIAGNOSIS — Z95828 Presence of other vascular implants and grafts: Secondary | ICD-10-CM

## 2016-01-26 DIAGNOSIS — G9389 Other specified disorders of brain: Secondary | ICD-10-CM | POA: Diagnosis not present

## 2016-01-26 DIAGNOSIS — F0150 Vascular dementia without behavioral disturbance: Secondary | ICD-10-CM

## 2016-01-26 DIAGNOSIS — R05 Cough: Secondary | ICD-10-CM

## 2016-01-26 DIAGNOSIS — R339 Retention of urine, unspecified: Secondary | ICD-10-CM | POA: Diagnosis present

## 2016-01-26 DIAGNOSIS — R270 Ataxia, unspecified: Secondary | ICD-10-CM

## 2016-01-26 DIAGNOSIS — D329 Benign neoplasm of meninges, unspecified: Secondary | ICD-10-CM | POA: Diagnosis present

## 2016-01-26 LAB — CBC
HCT: 37.6 % — ABNORMAL LOW (ref 39.0–52.0)
Hemoglobin: 12.1 g/dL — ABNORMAL LOW (ref 13.0–17.0)
MCH: 29.2 pg (ref 26.0–34.0)
MCHC: 32.2 g/dL (ref 30.0–36.0)
MCV: 90.6 fL (ref 78.0–100.0)
Platelets: 132 10*3/uL — ABNORMAL LOW (ref 150–400)
RBC: 4.15 MIL/uL — ABNORMAL LOW (ref 4.22–5.81)
RDW: 14.1 % (ref 11.5–15.5)
WBC: 11.2 10*3/uL — ABNORMAL HIGH (ref 4.0–10.5)

## 2016-01-26 LAB — BASIC METABOLIC PANEL
Anion gap: 8 (ref 5–15)
BUN: 11 mg/dL (ref 6–20)
CO2: 26 mmol/L (ref 22–32)
Calcium: 10.2 mg/dL (ref 8.9–10.3)
Chloride: 105 mmol/L (ref 101–111)
Creatinine, Ser: 1.15 mg/dL (ref 0.61–1.24)
GFR calc Af Amer: >60 mL/min (ref >60)
GFR calc non Af Amer: 54 mL/min — ABNORMAL LOW (ref >60)
Glucose, Bld: 109 mg/dL — ABNORMAL HIGH (ref 65–99)
Potassium: 4.2 mmol/L (ref 3.5–5.1)
Sodium: 139 mmol/L (ref 135–145)

## 2016-01-26 MED ORDER — ACETAMINOPHEN 650 MG RE SUPP
650.0000 mg | Freq: Four times a day (QID) | RECTAL | Status: DC | PRN
Start: 2016-01-26 — End: 2016-01-29
  Administered 2016-01-27: 650 mg via RECTAL
  Filled 2016-01-26: qty 1

## 2016-01-26 MED ORDER — ATORVASTATIN CALCIUM 10 MG PO TABS
20.0000 mg | ORAL_TABLET | Freq: Every day | ORAL | Status: DC
  Administered 2016-01-27 – 2016-01-28 (×2): 20 mg via ORAL
  Filled 2016-01-26 (×2): qty 2

## 2016-01-26 MED ORDER — RIVAROXABAN 15 MG PO TABS
15.0000 mg | ORAL_TABLET | Freq: Every day | ORAL | Status: DC
  Administered 2016-01-27 – 2016-01-28 (×2): 15 mg via ORAL
  Filled 2016-01-26 (×2): qty 1

## 2016-01-26 MED ORDER — ACETAMINOPHEN 325 MG PO TABS: 650.0000 mg | ORAL_TABLET | Freq: Four times a day (QID) | ORAL | Status: DC | PRN

## 2016-01-26 NOTE — Progress Notes (Signed)
Patient arrived to [phone] direct admitting from doctor office. Dr. Reesa Chew paged and MD will round as soon as possible. VSS. Family at bedside. Will continue to monitor.  Ave Filter, RN

## 2016-01-26 NOTE — Assessment & Plan Note (Addendum)
Assessment: Gait instability  Patient has a 3 day history of leaning to the right side that became more pronounced this morning. Patient has a history of a stroke.  On exam patient was leaning to the right when using his walker and had a hard time picking up his right foot. I am concerned for a recurrent stroke and patient will need a stat CT.  Patient may also have an infectious process that could explain his gait instability. Would recommend a UA, urine culture, CBC and CMP. He may also benefit from PT evaluation inpatient.  Spoke with Dr. Benjamine Mola and patient will be a direct admit to the teaching service.  Plan -Direct admit to internal medicine teaching service

## 2016-01-26 NOTE — H&P (Signed)
Date: 01/26/2016               Patient Name:  [name] MRN: [id number]  DOB: 1924-06-21 Age / Sex: 82 y.o., male   PCP: [name], DO         Medical Service: Internal Medicine Teaching Service         Attending Physician: Dr. Axel Filler, MD    First Contact: Dr. Inda Castle, MD Pager: [phone]  Second Contact: Dr. Benjamine Mola, MD Pager: [phone]       After Hours (After 5p/  First Contact Pager: [phone]  weekends / holidays): Second Contact Pager: [phone]   Chief Complaint: progressive leaning towards right side  History of Present Illness:  This is an 82 y/o M who was directly admitted from clinic for evaluation of progressive leaning towards the right side while walking. He has MHx significant for dementia, hearing loss, CVA with residual right-sided weakness, meningioma, chronic LE DVT on a/c with IVF and CKD-3. He presents with his daughter, his primary care giver, who provided most of the history. She noted the patient subtly leaning towards the right 3 days ago however this morning there was an obvious difference from baseline. Daughter is concerned he may have a UTI as he's had similar presentations with prior UTI's. Daughter also reports he's had a stable non-productive cough x1 week. Pt reports he feels "fine" and has no complaints. Unsure of why he is in the hospital. He denies any fevers, chills, chest pain or abdominal pain.   Meds:  Current Meds  Medication Sig  . amLODipine (NORVASC) 5 MG tablet Take 1 tablet (5 mg total) by mouth daily.  Marland Kitchen atorvastatin (LIPITOR) 20 MG tablet Take 1 tablet (20 mg total) by mouth daily at 6 PM.  . irbesartan (AVAPRO) 300 MG tablet Take 1 tablet (300 mg total) by mouth daily.  . metoprolol tartrate (LOPRESSOR) 25 MG tablet Take 1 tablet (25 mg total) by mouth 2 (two) times daily.  . Rivaroxaban (XARELTO) 15 MG TABS tablet take 1 tablet by mouth once daily WITH SUPPER   Allergies: Allergies as of 01/26/2016  . (No  Known Allergies)   Past Medical History:  Diagnosis Date  . Anemia   . BPH (benign prostatic hypertrophy)    TURP 05/19/13  . Chronic kidney disease    CHRONIC KIDNEY DISEASE, 2  . Difficulty hearing, right    BILATERAL HEARING LOSS - BEST TO TRY TO SPEAK INTO LEFT EAR  . DVT (deep venous thrombosis) ([place])   . Frequent falls   . Hyperlipidemia   . Hypertension   . Incontinence of urine    SOME INCONTINENCE  . Meningioma ([place])   . Stroke ([place])    Cerebellar, 2013; WALKS WITH WALKER, ABLE TO DRESS AND BATHE HIMSELF BUT FAMILY TRIES TO PROVIDE SUPERVISION BECAUSE OF HIS HX OF FALL AND WEAKNESS LEGS, ARMS   . Thrombocytopenia ([place])    Family History:  Mother: DM, heart disease, HTN Father: Heart disease, HTN Daughter: HTN  Social History: Never smoked. Does not drink alcohol or use recreational drugs. Lives at home with his daughter, [name], who is his primary care giver.   Review of Systems: A complete ROS was limited due to patients dementia.   Physical Exam: There were no vitals taken for this visit. General: Chronically-ill appearing elderly african [place] male resting comfortably in bed. Watching TV. In no acute distress. Exceptionally hard of hearing HENT: PERRL. Tongue protrusion symmetric.  Facial muscles symmetric. Oropharynx clear.  Cardiovascular: Regular rate and rhythm. No murmur or rub appreciated. Pulmonary: CTA BL however poor inspiratory effort secondary to mental status. No wheezes or crackles appreciated.  Abdomen: Soft, non-tender and not distended. No rigidity. Normoactive bowel sounds appreciated throughout.  Extremities: BL LE with some chronic hypopigmentation. No peripheral edema. Peripheral pulses equal and symmetric bilaterally. Neuro: Strength grossly intact throughout, 5/5 BL. EOMI except patient noted to have left-ward nystagmus and tracking difficulty through eval of EOM. Abnormal finger to nose testing showing ataxia. Neuro exam otherwise limited  due to patients non-compliance.  Skin: warm, dry.   CXR: Stable cardiomegaly with mild vascular congestion. No consolidation or effusion.  CT Head w/o contrast: No acute abnormality identified. Chronic right cerebellar infarct with encephalomalacia. Atrophy. Stable left sphenoid wing meningioma.   CBC Latest Ref Rng & Units February 12, 2016 03/01/2015 02/28/2015  WBC 4.0 - 10.5 K/uL 11.2(H) 8.2 8.5  Hemoglobin 13.0 - 17.0 g/dL 12.1(L) 10.7(L) 10.1(L)  Hematocrit 39.0 - 52.0 % 37.6(L) 34.0(L) 31.0(L)  Platelets 150 - 400 K/uL 132(L) 184 157   BMP Latest Ref Rng & Units 12-Feb-2016 03/01/2015 02/28/2015  Glucose 65 - 99 mg/dL 109(H) 103(H) 110(H)  BUN 6 - 20 mg/dL 11 15 13   Creatinine 0.61 - 1.24 mg/dL 1.15 1.05 1.16  Sodium 135 - 145 mmol/L 139 141 139  Potassium 3.5 - 5.1 mmol/L 4.2 4.3 3.6  Chloride 101 - 111 mmol/L 105 108 106  CO2 22 - 32 mmol/L 26 27 27   Calcium 8.9 - 10.3 mg/dL 10.2 9.7 9.4   URINALYSIS: Many bacteria, large leukocytes, positive nitrate, TNTC WBCs  Assessment & Plan by Problem: Principal Problem:   Gait instability Active Problems:   HTN (hypertension)   Hearing loss   Meningioma (HCC)   Leg DVT (deep venous thromboembolism), chronic (HCC)   CKD (chronic kidney disease) stage 3, GFR 30-59 ml/min   Cerebral embolism with cerebral infarction (HCC)   Long term current use of anticoagulant therapy- Xarelto   UTI (urinary tract infection)   Presence of IVC filter   Weakness  Urinary Tract Infection 82 y/o M here with several day history of worsening gait instability. Pt has baseline right-sided gait instability secondary to CVA in 2013 however his daughter has noticed worsening leaning to his right side, most pronounced the morning of 02/12/23. Daughter reports this has happened before when he's had UTI's and resolves with abx treatment. UA positive for infection. Fever of 100.7*. CBC with mild leukocytosis of 11.2 and head CT was negative for acute process. Patients  worsening gait instability likely secondary to UTI.  -IV Ceftriaxone -Gentle hydration with ns @ 75 mL/hr -Rectal Tylenol prn Fever -Repeat AM CBC shows resolution of leukocytosis  Gait Instability Patient has baseline right-sided weakness and gait instability however is more pronounced than usual, with patient apparently leaning more towards the right than usual. ?CVA vs metabolic cause. Daughter reports he has these syx when he has UTIs -CT without acute process, can consider MRI  History of CVA Right cerebellar infarction, 2013. Currently has baseline right-sided weakness and gait instability. Uses walker. On Atorvastatin 20mg  at home. Does not appear to currently be on anti-plt therapy. Chart review indicates he has been on ASA and plavix before. Discharge sum 10/15 d/c'd plavix however did not mention starting ASA. -Consider starting ASA  Chronic LE DVT, IVF Filter, on Long-term anticoagulation-Xarelto Recurrent, most recently noted in 2015. IVF placed at that time as pt developed hematuria on a/c.  On IVF filter, pt continued to develop significant clots per PCP visit 6/15 so he was started on Xarelto. -Will continue Xarelto  HTN On Metoprolol 25 mg BID, Amlodipine 5 mg daily and Irbesartan 300 mg daily.  -Holding home antihypertensives. BP stable at this time. Consider adding as needed.   Meningioma Incidental finding first noted in 2002. Appears stable.   CKD Stage 3 Appears stable and at baseline. No AKI.  Diet: NPO, failed initial stroke swallow screen IVF: NS @ 75 ml/hr Code Status: FULL DVT Prophylaxis: NOAx  Dispo: Admit patient to Observation with expected length of stay less than 2 midnights.  SignedEinar Gip, DO 01/26/2016, 8:12 PM  Pager: [phone]

## 2016-01-26 NOTE — Progress Notes (Signed)
   CC: Leaning to the right side when using a walker  HPI:  [name] is a 82 y.o. gentleman with history noted below that presents to the internal medicine clinic for progressive leaning to the right side when walking.  Daughter is present and provides most of the history.  She is the primary care giver.  Daughter first noticed the patient leaning to the right 3 days ago. She said it was very subtle at that time. This morning she states it was an obvious difference from his baseline.  Daughter states that he has some right-sided deficits from his previous CVA. She is also concerned that this could be a UTI as he has presented like this with previous UTIs. She also states the patient has had a nonproductive cough for the past week that has been stable.  Patient states he feels "fine" but that he's been coughing more than usual.. He denies any pain, dysuria, nausea vomiting, or fevers.     Past Medical History:  Diagnosis Date  . Anemia   . BPH (benign prostatic hypertrophy)    TURP 05/19/13  . Chronic kidney disease    CHRONIC KIDNEY DISEASE, 2  . Difficulty hearing, right    BILATERAL HEARING LOSS - BEST TO TRY TO SPEAK INTO LEFT EAR  . DVT (deep venous thrombosis) ([place])   . Frequent falls   . Hyperlipidemia   . Hypertension   . Incontinence of urine    SOME INCONTINENCE  . Meningioma ([place])   . Stroke ([place])    Cerebellar, 2013; WALKS WITH WALKER, ABLE TO DRESS AND BATHE HIMSELF BUT FAMILY TRIES TO PROVIDE SUPERVISION BECAUSE OF HIS HX OF FALL AND WEAKNESS LEGS, ARMS   . Thrombocytopenia ([place])     Review of Systems:  As noted in HPI  Physical Exam:  Vitals:   01/26/16 1545  BP: (!) 174/90  Pulse: 69  Temp: 99.7 F (37.6 C)  TempSrc: Oral  SpO2: 99%  Weight: 179 lb 6.4 oz (81.4 kg)  Height: 6' (1.829 m)   Physical Exam  Constitutional: He is well-developed, well-nourished, and in no distress.  Cardiovascular: Normal rate, regular rhythm and normal heart sounds.   Exam reveals no gallop and no friction rub.   No murmur heard. Pulmonary/Chest: Effort normal and breath sounds normal. No respiratory distress. He has no wheezes. He has no rales.  Musculoskeletal: He exhibits no edema.  Neurological:  5/5 motor strength and upper and lower extremities bilaterally Cranial nerves II-XII intact Patient could not complete the nose to finger exam. He had difficulty with his right arm When observing gait patient had right foot magnetic gait and was leaning to the right side.  Skin: Skin is warm and dry.    Assessment & Plan:   See encounters tab for problem based medical decision making.   Patient seen with Dr. Daryll Drown

## 2016-01-27 DIAGNOSIS — B9620 Unspecified Escherichia coli [E. coli] as the cause of diseases classified elsewhere: Secondary | ICD-10-CM | POA: Diagnosis not present

## 2016-01-27 DIAGNOSIS — N390 Urinary tract infection, site not specified: Secondary | ICD-10-CM | POA: Diagnosis not present

## 2016-01-27 DIAGNOSIS — N400 Enlarged prostate without lower urinary tract symptoms: Secondary | ICD-10-CM

## 2016-01-27 DIAGNOSIS — I69393 Ataxia following cerebral infarction: Secondary | ICD-10-CM

## 2016-01-27 DIAGNOSIS — R338 Other retention of urine: Secondary | ICD-10-CM | POA: Diagnosis not present

## 2016-01-27 DIAGNOSIS — I129 Hypertensive chronic kidney disease with stage 1 through stage 4 chronic kidney disease, or unspecified chronic kidney disease: Secondary | ICD-10-CM

## 2016-01-27 DIAGNOSIS — Z95828 Presence of other vascular implants and grafts: Secondary | ICD-10-CM

## 2016-01-27 DIAGNOSIS — Z87440 Personal history of urinary (tract) infections: Secondary | ICD-10-CM

## 2016-01-27 DIAGNOSIS — N183 Chronic kidney disease, stage 3 (moderate): Secondary | ICD-10-CM

## 2016-01-27 DIAGNOSIS — N401 Enlarged prostate with lower urinary tract symptoms: Secondary | ICD-10-CM | POA: Diagnosis not present

## 2016-01-27 DIAGNOSIS — B9689 Other specified bacterial agents as the cause of diseases classified elsewhere: Secondary | ICD-10-CM

## 2016-01-27 DIAGNOSIS — I82509 Chronic embolism and thrombosis of unspecified deep veins of unspecified lower extremity: Secondary | ICD-10-CM

## 2016-01-27 DIAGNOSIS — Z7901 Long term (current) use of anticoagulants: Secondary | ICD-10-CM

## 2016-01-27 DIAGNOSIS — I69351 Hemiplegia and hemiparesis following cerebral infarction affecting right dominant side: Secondary | ICD-10-CM

## 2016-01-27 DIAGNOSIS — F0150 Vascular dementia without behavioral disturbance: Secondary | ICD-10-CM

## 2016-01-27 DIAGNOSIS — D320 Benign neoplasm of cerebral meninges: Secondary | ICD-10-CM

## 2016-01-27 LAB — BASIC METABOLIC PANEL
Anion gap: 8 (ref 5–15)
BUN: 13 mg/dL (ref 6–20)
CO2: 25 mmol/L (ref 22–32)
Calcium: 10.1 mg/dL (ref 8.9–10.3)
Chloride: 106 mmol/L (ref 101–111)
Creatinine, Ser: 1.23 mg/dL (ref 0.61–1.24)
GFR calc Af Amer: 58 mL/min — ABNORMAL LOW (ref >60)
GFR calc non Af Amer: 50 mL/min — ABNORMAL LOW (ref >60)
Glucose, Bld: 108 mg/dL — ABNORMAL HIGH (ref 65–99)
Potassium: 3.7 mmol/L (ref 3.5–5.1)
Sodium: 139 mmol/L (ref 135–145)

## 2016-01-27 LAB — HEPATIC FUNCTION PANEL
ALT: 11 U/L — ABNORMAL LOW (ref 17–63)
AST: 18 U/L (ref 15–41)
Albumin: 3.1 g/dL — ABNORMAL LOW (ref 3.5–5.0)
Alkaline Phosphatase: 53 U/L (ref 38–126)
Bilirubin, Direct: 0.2 mg/dL (ref 0.1–0.5)
Indirect Bilirubin: 0.8 mg/dL (ref 0.3–0.9)
Total Bilirubin: 1 mg/dL (ref 0.3–1.2)
Total Protein: 6.6 g/dL (ref 6.5–8.1)

## 2016-01-27 LAB — CBC WITH DIFFERENTIAL/PLATELET
Basophils Absolute: 0 10*3/uL (ref 0.0–0.1)
Basophils Relative: 0 %
Eosinophils Absolute: 0 10*3/uL (ref 0.0–0.7)
Eosinophils Relative: 0 %
HCT: 35.2 % — ABNORMAL LOW (ref 39.0–52.0)
Hemoglobin: 11.6 g/dL — ABNORMAL LOW (ref 13.0–17.0)
Lymphocytes Relative: 13 %
Lymphs Abs: 1.2 10*3/uL (ref 0.7–4.0)
MCH: 29.7 pg (ref 26.0–34.0)
MCHC: 33 g/dL (ref 30.0–36.0)
MCV: 90 fL (ref 78.0–100.0)
Monocytes Absolute: 1.1 10*3/uL — ABNORMAL HIGH (ref 0.1–1.0)
Monocytes Relative: 12 %
Neutro Abs: 6.7 10*3/uL (ref 1.7–7.7)
Neutrophils Relative %: 75 %
Platelets: 122 10*3/uL — ABNORMAL LOW (ref 150–400)
RBC: 3.91 MIL/uL — ABNORMAL LOW (ref 4.22–5.81)
RDW: 14.2 % (ref 11.5–15.5)
WBC: 9.1 10*3/uL (ref 4.0–10.5)

## 2016-01-27 LAB — URINALYSIS, ROUTINE W REFLEX MICROSCOPIC
Bilirubin Urine: NEGATIVE
Glucose, UA: NEGATIVE mg/dL
Ketones, ur: NEGATIVE mg/dL
Nitrite: POSITIVE — AB
Protein, ur: 30 mg/dL — AB
Specific Gravity, Urine: 1.017 (ref 1.005–1.030)
pH: 6.5 (ref 5.0–8.0)

## 2016-01-27 LAB — URINE MICROSCOPIC-ADD ON

## 2016-01-27 LAB — PROTIME-INR
INR: 1.41
Prothrombin Time: 17.3 seconds — ABNORMAL HIGH (ref 11.4–15.2)

## 2016-01-27 MED ORDER — SODIUM CHLORIDE 0.9 % IV BOLUS (SEPSIS)
1000.0000 mL | Freq: Once | INTRAVENOUS | Status: AC
  Administered 2016-01-27: 1000 mL via INTRAVENOUS

## 2016-01-27 MED ORDER — DEXTROSE 5 % IV SOLN
1.0000 g | INTRAVENOUS | Status: DC
  Administered 2016-01-27: 1 g via INTRAVENOUS
  Filled 2016-01-27 (×2): qty 10

## 2016-01-27 MED ORDER — CEPHALEXIN 500 MG PO CAPS
500.0000 mg | ORAL_CAPSULE | Freq: Two times a day (BID) | ORAL | Status: DC
  Administered 2016-01-28: 500 mg via ORAL
  Filled 2016-01-27: qty 1

## 2016-01-27 MED ORDER — SODIUM CHLORIDE 0.9 % IV SOLN
INTRAVENOUS | Status: AC
  Administered 2016-01-27: 04:00:00 via INTRAVENOUS

## 2016-01-27 NOTE — Clinical Social Work Placement (Signed)
   CLINICAL SOCIAL WORK PLACEMENT  NOTE  Date:  01/27/2016  Patient Details  Name: [name] MRN: [id number] Date of Birth: 1925-03-06  Clinical Social Work is seeking post-discharge placement for this patient at the [place] level of care (*CSW will initial, date and re-position this form in  chart as items are completed):  Yes   Patient/family provided with [facility] Work Department's list of facilities offering this level of care within the geographic area requested by the patient (or if unable, by the patient's family).  Yes   Patient/family informed of their freedom to choose among providers that offer the needed level of care, that participate in Medicare, Medicaid or managed care program needed by the patient, have an available bed and are willing to accept the patient.  Yes   Patient/family informed of [facility]'s ownership interest in [facility] and [facility], as well as of the fact that they are under no obligation to receive care at these facilities.  PASRR submitted to EDS on       PASRR number received on       Existing PASRR number confirmed on 01/27/16     FL2 transmitted to all facilities in geographic area requested by pt/family on 01/27/16     FL2 transmitted to all facilities within larger geographic area on       Patient informed that his/her managed care company has contracts with or will negotiate with certain facilities, including the following:            Patient/family informed of bed offers received.  Patient chooses bed at       Physician recommends and patient chooses bed at      Patient to be transferred to   on  .  Patient to be transferred to facility by       Patient family notified on   of transfer.  Name of family member notified:        PHYSICIAN Please sign FL2     Additional Comment:    _______________________________________________ Lilly [place], LCSW 01/27/2016, 3:10  PM

## 2016-01-27 NOTE — Care Management Note (Signed)
Case Management Note  Patient Details  Name: [name] MRN: [id number] Date of Birth: 1925/03/07  Subjective/Objective:  Pt in with gait instability. He is from home with his daughter.                   Action/Plan: PT recommending SNF. CM spoke to patients daughter and she is in agreement [facility] for rehab. She is asking for [facility]. CSW informed. CM following for further d/c needs.   Expected Discharge Date:                  Expected Discharge Plan:  [place]  In-House Referral:     Discharge planning Services     Post Acute Care Choice:    Choice offered to:     DME Arranged:    DME Agency:     HH Arranged:    [place] Agency:     Status of Service:  In process, will continue to follow  If discussed at Long Length of Stay Meetings, dates discussed:    Additional Comments:  Pollie Friar, RN 01/27/2016, 3:53 PM

## 2016-01-27 NOTE — Evaluation (Signed)
Physical Therapy Evaluation Patient Details Name: [name] MRN: [id number] DOB: Apr 07, 1924 Today's Date: 01/27/2016   History of Present Illness  82 y/o admitted from clinic for evaluation of progressive leaning towards the right side while walking. PMHx significant for dementia, hearing loss, CVA with residual right-sided weakness, meningioma, chronic LE DVT,  and CKD-3. CT chronic right cerebellar infarct with encephalomalacia. +UTI    Clinical Impression  Pt admitted with above diagnosis. Patient with signficant decline in his functional status (OT was able to talk to pt's daughter for prior functional level). Currently requires 2 person assist to attempt ambulation. Pt currently with functional limitations due to the deficits listed below (see PT Problem List).  Pt will benefit from skilled PT to increase their independence and safety with mobility to allow discharge to the venue listed below.       Follow Up Recommendations SNF    Equipment Recommendations  None recommended by PT    Recommendations for Other Services       Precautions / Restrictions Precautions Precautions: Fall      Mobility  Bed Mobility               General bed mobility comments: Pt OOB in chair  Transfers Overall transfer level: Needs assistance Equipment used: Rolling walker (2 wheeled) Transfers: Sit to/from Stand Sit to Stand: Max assist (posteiror lean)         General transfer comment: used pt's sneakers at his request; strong retropulsion with heavy chair pushed backwards 6" as pt ascended with assist  Ambulation/Gait             General Gait Details: unsafe to attempt due to posterior lean/LOB; will need 2 people  Stairs            Wheelchair Mobility    Modified Rankin (Stroke Patients Only)       Balance Overall balance assessment: Needs assistance   Sitting balance-Leahy Scale: Fair Sitting balance - Comments: R bias   Standing balance support:  Bilateral upper extremity supported Standing balance-Leahy Scale: Zero                               Pertinent Vitals/Pain Pain Assessment: No/denies pain    Home Living Family/patient expects to be discharged to:: Skilled nursing facility                      Prior Function Level of Independence: Needs assistance   Gait / Transfers Assistance Needed: family assisted with mobility. Primarily used w/c and stood at sink for ADL. Pt assisted with transfers  ADL's / Homemaking Assistance Needed: family assists with bathing and dressing. Pt able to wash his upper body and groin area        Hand Dominance   Dominant Hand: Right    Extremity/Trunk Assessment   Upper Extremity Assessment: Defer to OT evaluation           Lower Extremity Assessment: Generalized weakness (AROM WfL; difficulty understanding strenght testing)      Cervical / Trunk Assessment: Kyphotic  Communication   Communication: HOH  Cognition Arousal/Alertness: Awake/alert Behavior During Therapy: WFL for tasks assessed/performed Overall Cognitive Status: Difficult to assess                      General Comments      Exercises     Assessment/Plan    PT Assessment  Patient needs continued PT services  PT Problem List Decreased strength;Decreased balance;Decreased mobility;Decreased cognition;Decreased knowledge of use of DME;Obesity          PT Treatment Interventions DME instruction;Gait training;Functional mobility training;Therapeutic activities;Therapeutic exercise;Balance training;Cognitive remediation;Patient/family education    PT Goals (Current goals can be found in the Care Plan section)  Acute Rehab PT Goals Patient Stated Goal: family goal is for pt to return to PLOF PT Goal Formulation: Patient unable to participate in goal setting Time For Goal Achievement: 02/10/16 Potential to Achieve Goals: Fair    Frequency Min 2X/week   Barriers to discharge         Co-evaluation               End of Session Equipment Utilized During Treatment: Gait belt Activity Tolerance: Patient tolerated treatment well Patient left: in chair;with call bell/phone within reach;with chair alarm set;with nursing/sitter in room Nurse Communication: Mobility status    Functional Assessment Tool Used: clinical judgement Functional Limitation: Mobility: Walking and moving around Mobility: Walking and Moving Around Current Status [phone]): At least 60 percent but less than 80 percent impaired, limited or restricted Mobility: Walking and Moving Around Goal Status [phone]): At least 20 percent but less than 40 percent impaired, limited or restricted    Time: 1150-1204 PT Time Calculation (min) (ACUTE ONLY): 14 min   Charges:   PT Evaluation $PT Eval Low Complexity: 1 Procedure     PT G Codes:   PT G-Codes **NOT FOR INPATIENT CLASS** Functional Assessment Tool Used: clinical judgement Functional Limitation: Mobility: Walking and moving around Mobility: Walking and Moving Around Current Status [id number]): At least 60 percent but less than 80 percent impaired, limited or restricted Mobility: Walking and Moving Around Goal Status [phone]): At least 20 percent but less than 40 percent impaired, limited or restricted    Rexanne Mano 01/27/2016, 2:20 PM Pager [phone]

## 2016-01-27 NOTE — Progress Notes (Signed)
Initial Nutrition Assessment  DOCUMENTATION CODES:   Not applicable  INTERVENTION:   - Encouraged PO intake. - Will monitor meal completion and supplement as appropriate.  NUTRITION DIAGNOSIS:   Predicted suboptimal nutrient intake related to lethargy/confusion as evidenced by per patient/family report.  GOAL:   Patient will meet greater than or equal to 90% of their needs  MONITOR:   PO intake, Labs, Skin  REASON FOR ASSESSMENT:   Low Braden   ASSESSMENT:   82 y/o M who was directly admitted from clinic for evaluation of progressive leaning towards the right side while walking. He has MHx significant for dementia, hearing loss, CVA with residual right-sided weakness, meningioma, chronic LE DVT on a/c with IVF and CKD-3. Daughter noted the patient subtly leaning towards the right 3 days ago however this morning there was an obvious difference from baseline. Daughter is concerned he may have a UTI as he's had similar presentations with prior UTI's. Daughter also reports he's had a stable non-productive cough x1 week.  Spoke with pt at bedside who was very pleasant but disoriented. Pt states his daughter brings him meals at the hospital. No meal completion information in chart.  Spoke with pt's daughter via phone. Pt's daughter reports pt has "very good" appetite and enjoys beef, chicken, and fish at dinner. Pt's daughter states that pt has difficulty chewing and cannot have tough cuts of meat. Pt might consume a sandwich and chips for lunch. Per pt's daughter, pt eats 2-3 meals per day depending on when he wakes up in the morning; pt loves desserts. Pt does not consume oral nutrition supplements.  Pt ambulates using a walker and also has a wheelchair at home. Per pt's daughter, pt usually ambulates well but hasn't recently due to unsteadiness.  Pt's daughter reports pt's UBW is 170-180# and that his weight has been stable over time.  Medications reviewed and include 20 mg  Lipitor daily.  Labs reviewed and include elevated glucose (108 mg/dL).  Diet Order:  Diet regular Room service appropriate? Yes; Fluid consistency: Thin  Skin:  Reviewed, no issues  Last BM:  01/26/16  Height:   Ht Readings from Last 1 Encounters:  01/26/16 6' (1.829 m)    Weight:   Wt Readings from Last 1 Encounters:  01/26/16 179 lb 6.4 oz (81.4 kg)    Ideal Body Weight:  80.9 kg  BMI:  24.3 kg/m^2  Estimated Nutritional Needs:   Kcal:  1600-1800  Protein:  85-65 grams  Fluid:  1.6-1.8 L/day  EDUCATION NEEDS:   No education needs identified at this time  Jeb Levering Dietetic Intern Pager Number: [phone]

## 2016-01-27 NOTE — Progress Notes (Signed)
Occupational Therapy Evaluation Patient Details Name: [name] MRN: [id number] DOB: 06-Mar-1925 Today's Date: 01/27/2016    History of Present Illness 82 y/o admitted from clinic for evaluation of progressive leaning towards the right side while walking. PMHx significant for dementia, hearing loss, CVA with residual right-sided weakness, meningioma, chronic LE DVT,  and CKD-3. CT chronic right cerebellar infarct with encephalomalacia. +UTI   Clinical Impression   PTA, pt lived at home with his daughter, who assisted with mobility and ADL. Per daughter, pt is not at his baseline functionally due to below deficits. Feel pt will benefit from rehab at SNF to return to PLOF at home living with daughter and reduce burden of care. Family prefers [name] for rehab if possible. Will follow acutely.    Follow Up Recommendations  SNF;Supervision/Assistance - 24 hour    Equipment Recommendations  None recommended by OT    Recommendations for Other Services       Precautions / Restrictions Precautions Precautions: Fall      Mobility Bed Mobility               General bed mobility comments: Pt OOB in chair  Transfers Overall transfer level: Needs assistance   Transfers: Sit to/from Stand Sit to Stand: Max assist (posteiror lean)              Balance Overall balance assessment: Needs assistance   Sitting balance-Leahy Scale: Fair Sitting balance - Comments: R bias     Standing balance-Leahy Scale: Poor                              ADL Overall ADL's : Needs assistance/impaired Eating/Feeding: Set up   Grooming: Set up;Supervision/safety;Cueing for sequencing   Upper Body Bathing: Minimal assitance;Sitting   Lower Body Bathing: Moderate assistance;Sit to/from stand   Upper Body Dressing : Minimal assistance;Sitting   Lower Body Dressing: Maximal assistance   Toilet Transfer: Maximal assistance Toilet Transfer Details (indicate cue  type and reason): sit - stand Toileting- Clothing Manipulation and Hygiene: Maximal assistance Toileting - Clothing Manipulation Details (indicate cue type and reason): at baseline pt wears depends and has frequent incontinenet episodes   Tub/Shower Transfer Details (indicate cue type and reason): at baseline, pt steps over tub with family assisting Functional mobility during ADLs: Maximal assistance (sit - stand) General ADL Comments: Pt requries assistnace at baseline, but demonstrates a decline.     Vision     Perception     Praxis      Pertinent Vitals/Pain       Hand Dominance Right   Extremity/Trunk Assessment Upper Extremity Assessment Upper Extremity Assessment: Generalized weakness (but functional)   Lower Extremity Assessment Lower Extremity Assessment: Defer to PT evaluation   Cervical / Trunk Assessment Cervical / Trunk Assessment: Kyphotic   Communication Communication Communication: HOH   Cognition Arousal/Alertness: Awake/alert Behavior During Therapy: WFL for tasks assessed/performed Overall Cognitive Status: History of cognitive impairments - at baseline                     General Comments       Exercises       Shoulder Instructions      Home Living Family/patient expects to be discharged to:: Skilled nursing facility  Prior Functioning/Environment Level of Independence: Needs assistance  Gait / Transfers Assistance Needed: family assisted with mobility. Primarily used w/c and stood at sink for ADL. Pt assisted with transfers ADL's / Homemaking Assistance Needed: family assists with bathing and dressing. Pt able to wash his upper body and groin area Communication / Swallowing Assistance Needed: HOH          OT Problem List: Decreased strength;Decreased activity tolerance;Impaired balance (sitting and/or standing);Decreased cognition;Decreased safety awareness;Decreased  knowledge of use of DME or AE   OT Treatment/Interventions: Self-care/ADL training;Therapeutic exercise;DME and/or AE instruction;Therapeutic activities;Cognitive remediation/compensation;Patient/family education;Balance training    OT Goals(Current goals can be found in the care plan section) Acute Rehab OT Goals Patient Stated Goal: family goal is for pt to return to PLOF OT Goal Formulation: With patient/family Time For Goal Achievement: 02/10/16 Potential to Achieve Goals: Good  OT Frequency: Min 2X/week   Barriers to D/C:            Co-evaluation              End of Session Nurse Communication: Mobility status  Activity Tolerance: Patient tolerated treatment well Patient left: in chair;with call bell/phone within reach;with family/visitor present   Time: 1240-1257 OT Time Calculation (min): 17 min Charges:  OT General Charges $OT Visit: 1 Procedure OT Evaluation $OT Eval Moderate Complexity: 1 Procedure G-Codes: OT G-codes **NOT FOR INPATIENT CLASS** Functional Assessment Tool Used: clinical judgement Functional Limitation: Self care Self Care Current Status [id number]): At least 60 percent but less than 80 percent impaired, limited or restricted Self Care Goal Status [id number]): At least 20 percent but less than 40 percent impaired, limited or restricted  Matina Rodier,HILLARY 01/27/2016, 2:11 PM  [facility], OTR/L  [phone] 01/27/2016

## 2016-01-27 NOTE — Progress Notes (Signed)
Subjective: He denies any pain, dizziness, dyspnea, or any other symptoms.  Per daughter [name], his mental status is near baseline but he is still more unsteady on his feet than normal.  Objective:  Vital signs in last 24 hours: Vitals:   01/27/16 0415 01/27/16 0606 01/27/16 1036 01/27/16 1356  BP: (!) 141/59 (!) 128/59 (!) 153/97 117/68  Pulse: 79 75 71 80  Resp: 19 18 19 19   Temp: 100.1 F (37.8 C) 98.6 F (37 C) 97.5 F (36.4 C) 97.7 F (36.5 C)  TempSrc: Oral Oral Oral Oral  SpO2: 93% 97%     Physical Exam  Constitutional: He appears well-developed and well-nourished. No distress.  Cardiovascular: Normal rate and regular rhythm.   Pulmonary/Chest: Effort normal and breath sounds normal.  Abdominal: Soft. He exhibits no distension. There is no tenderness.  Musculoskeletal: He exhibits no edema.  Neurological:  Alert, calm, pleasant, cooperative  Oriented only to person   CBC Latest Ref Rng & Units 01/27/2016 01/26/2016 03/01/2015  WBC 4.0 - 10.5 K/uL 9.1 11.2(H) 8.2  Hemoglobin 13.0 - 17.0 g/dL 11.6(L) 12.1(L) 10.7(L)  Hematocrit 39.0 - 52.0 % 35.2(L) 37.6(L) 34.0(L)  Platelets 150 - 400 K/uL 122(L) 132(L) 184   BMP Latest Ref Rng & Units 01/27/2016 01/26/2016 03/01/2015  Glucose 65 - 99 mg/dL 108(H) 109(H) 103(H)  BUN 6 - 20 mg/dL 13 11 15   Creatinine 0.61 - 1.24 mg/dL 1.23 1.15 1.05  Sodium 135 - 145 mmol/L 139 139 141  Potassium 3.5 - 5.1 mmol/L 3.7 4.2 4.3  Chloride 101 - 111 mmol/L 106 105 108  CO2 22 - 32 mmol/L 25 26 27   Calcium 8.9 - 10.3 mg/dL 10.1 10.2 9.7   Urinalysis    Component Value Date/Time   COLORURINE YELLOW 01/27/2016 0211   APPEARANCEUR CLOUDY (A) 01/27/2016 0211   LABSPEC 1.017 01/27/2016 0211   PHURINE 6.5 01/27/2016 0211   GLUCOSEU NEGATIVE 01/27/2016 0211   HGBUR LARGE (A) 01/27/2016 0211   BILIRUBINUR NEGATIVE 01/27/2016 0211   KETONESUR NEGATIVE 01/27/2016 0211   PROTEINUR 30 (A) 01/27/2016 0211   UROBILINOGEN 1.0  07/09/2014 1503   NITRITE POSITIVE (A) 01/27/2016 0211   LEUKOCYTESUR LARGE (A) 01/27/2016 0211   Head CT without contrast 01/26/2016 IMPRESSION: Chronic right cerebellar infarct with encephalomalacia. Chronic moderate to marked periventricular and subcortical white matter small vessel ischemic disease.  Superficial and central atrophy.  Stable left sphenoid wing meningioma.  No acute CT abnormality identified.  Assessment/Plan:  Principal Problem:   Gait instability Active Problems:   HTN (hypertension)   Hearing loss   Meningioma (HCC)   Leg DVT (deep venous thromboembolism), chronic (HCC)   CKD (chronic kidney disease) stage 3, GFR 30-59 ml/min   Cerebral embolism with cerebral infarction ([place])   Long term current use of anticoagulant therapy- Xarelto   UTI (urinary tract infection)   Presence of IVC filter   Weakness  #Urinary Tract Infection Improving.  Presented febrile with urinalysis with pyuria and bacteruria.  Urine grew E Coli resistant to ampicillin and unasyn in 02/2015.  Hemodynamically stable, responding well to treatment. -Switch antibiotics to cephalexin 500 mg PO BID to complete 7 days treatment (end 11/22) -F/u blood and urine cultures  #Gait Instability #History of CVA His gait instability is consistent is likely due to unmasking of chronic deficts from his prior cerebellar stroke due to his infection.  Improving with antibiotic treatment, normal head CT, and no new deficits to suggest new stroke. -PT/OT recommend SNF  #  HTN On Metoprolol 25 mg BID, Amlodipine 5 mg daily and Irbesartan 300 mg daily.  -Holding home antihypertensives -Restart as BP normalizes  Dispo: Anticipated discharge in approximately 1 day(s).   Minus Liberty, MD 01/27/2016, 2:15 PM Pager: [phone]

## 2016-01-27 NOTE — Progress Notes (Signed)
Patient stand and pivot to chair with RN and NT assistance. Patient is max assist with 2 people, was not able to follow instructions to pivot and sit.

## 2016-01-27 NOTE — Care Management Obs Status (Signed)
[place] NOTIFICATION   Patient Details  Name: EHSAN GELLERT MRN: [id number] Date of Birth: 03-22-1924   Medicare Observation Status Notification Given:  Yes    Pollie Friar, RN 01/27/2016, 2:55 PM

## 2016-01-27 NOTE — Clinical Social Work Note (Signed)
Clinical Social Work Assessment  Patient Details  Name: [name] MRN: [id number] Date of Birth: 03-24-1924  Date of referral:  01/27/16               Reason for consult:  Facility Placement, Discharge Planning                Permission sought to share information with:  Case Manager, Customer service manager, Family Supports Permission granted to share information::  Yes, Verbal Permission Granted  Name::        Agency::  SNF [facility]  Relationship::  Daughter  Contact Information:     Housing/Transportation Living arrangements for the past 2 months:  [place] of Information:  Patient, Medical Team, Case Manager, Adult Children Patient Interpreter Needed:  None Criminal Activity/Legal Involvement Pertinent to Current Situation/Hospitalization:  No - Comment as needed Significant Relationships:  Adult Children, Other Family Members Lives with:  Self, Adult Children Do you feel safe going back to the place where you live?  No Need for family participation in patient care:  Yes (Comment)  Care giving concerns:  Patient was assessed by CM, however unable to obtain information due to barriers with hearing and speaking.  LCSW spoke with daughter along with CM. Daughter reports patient lives alone and daughter stays with him at times. Interested in SNF for short term to regain strengthen.  Reports [facility] the past [facility].  Would like to return if bed available.   Social Worker assessment / plan:  SNF workup has been completed. Daughter is helping patient make decisions and agreeable to ST SNF. LCSW will follow up with bed offers and continue to assist with discharge planning. Plan: SNF  Pending bed offers.    Employment status:  Retired Nurse, adult PT Recommendations:  [place] / Referral to community resources:  [place]  Patient/Family's Response to care:   Agreeable to plan  Patient/Family's Understanding of and Emotional Response to Diagnosis, Current Treatment, and Prognosis:  Daughter voices understanding and concern for patient. Understands level of care needed at dc is too much for her to handle at home. Agreeable with recommendations and hopeful for short time at SNF.  Emotional Assessment Appearance:  Appears stated age Attitude/Demeanor/Rapport:  Other (cooperative, but difficult to understand) Affect (typically observed):  Accepting, Adaptable, Pleasant Orientation:  Oriented to Self, Oriented to Situation Alcohol / Substance use:  Not Applicable Psych involvement (Current and /or in the community):  No (Comment)  Discharge Needs  Concerns to be addressed:  No discharge needs identified Readmission within the last 30 days:  No Current discharge risk:  None Barriers to Discharge:  Continued Medical Work up   [place], LCSW 01/27/2016, 3:15 PM

## 2016-01-27 NOTE — Evaluation (Addendum)
Clinical/Bedside Swallow Evaluation Patient Details  Name: [name] MRN: [id number] Date of Birth: 1924-10-30  Today's Date: 01/27/2016 Time: SLP Start Time (ACUTE ONLY): 91 SLP Stop Time (ACUTE ONLY): 1021 SLP Time Calculation (min) (ACUTE ONLY): 14 min  Past Medical History:  Past Medical History:  Diagnosis Date  . Anemia   . BPH (benign prostatic hypertrophy)    TURP 05/19/13  . Chronic kidney disease    CHRONIC KIDNEY DISEASE, 2  . Difficulty hearing, right    BILATERAL HEARING LOSS - BEST TO TRY TO SPEAK INTO LEFT EAR  . DVT (deep venous thrombosis) ([place])   . Frequent falls   . Hyperlipidemia   . Hypertension   . Incontinence of urine    SOME INCONTINENCE  . Meningioma ([place])   . Stroke ([place])    Cerebellar, 2013; WALKS WITH WALKER, ABLE TO DRESS AND BATHE HIMSELF BUT FAMILY TRIES TO PROVIDE SUPERVISION BECAUSE OF HIS HX OF FALL AND WEAKNESS LEGS, ARMS   . Thrombocytopenia ([place])    Past Surgical History:  Past Surgical History:  Procedure Laterality Date  . CYSTOSCOPY N/A 06/13/2013   Procedure: CYSTOSCOPY FLEXIBLE BEDSIDE;  Surgeon: Ardis Hughs, MD;  Location: [facility];  Service: Urology;  Laterality: N/A;  . MYRINGOTOMY WITH TUBE PLACEMENT Bilateral   . TRANSURETHRAL RESECTION OF PROSTATE N/A 05/19/2013   Procedure: TRANSURETHRAL RESECTION OF THE PROSTATE WITH GYRUS INSTRUMENTS;  Surgeon: Ailene Rud, MD;  Location: [facility];  Service: Urology;  Laterality: N/A;   HPI:  82 y/o admitted from clinic for evaluation of progressive leaning towards the right side while walking. PMHx significant for dementia, hearing loss,CVA with residual right-sided weakness, meningioma, chronic LE DVT,  and CKD-3. CT chronic right cerebellar infarct with encephalomalacia. Chronic moderate to marked periventricular and subcortical white matter small vessel ischemic disease, no acute abnormality identified. Received ST services on CIR in 2013.   Assessment / Plan /  Recommendation Clinical Impression  Oral prep, mastication and transit WFL's. Swallow initiation appeared timely without indications of a pharyngeal dysphagia across consistencies and with cup and straw presentation. Recommend regular texture and thin liquids, pills whole with applesauce, intermittent supervision, no further ST required for swallow.    Aspiration Risk  Mild aspiration risk    Diet Recommendation Regular;Thin liquid   Liquid Administration via: Cup;Straw Medication Administration: Whole meds with liquid Supervision: Patient able to self feed;Intermittent supervision to cue for compensatory strategies Compensations: Slow rate;Small sips/bites Postural Changes: Seated upright at 90 degrees    Other  Recommendations Oral Care Recommendations: Oral care BID   Follow up Recommendations None      Frequency and Duration            Prognosis        Swallow Study   General HPI: 82 y/o admitted from clinic for evaluation of progressive leaning towards the right side while walking. PMHx significant for dementia, hearing loss,CVA with residual right-sided weakness, meningioma, chronic LE DVT,  and CKD-3. CT chronic right cerebellar infarct with encephalomalacia. Chronic moderate to marked periventricular and subcortical white matter small vessel ischemic disease, no acute abnormality identified. Received ST services on CIR in 2013. Type of Study: Bedside Swallow Evaluation Previous Swallow Assessment:  (documentation for SLE (no swallow)) Diet Prior to this Study: NPO Temperature Spikes Noted: No Respiratory Status: Room air History of Recent Intubation: No Behavior/Cognition: Alert;Cooperative;Pleasant mood;Requires cueing [facility]) Oral Cavity Assessment: Within Functional Limits Oral Care Completed by SLP: No Oral Cavity - Dentition:  Dentures, top (natural lower) Vision: Functional for self-feeding Self-Feeding Abilities: Able to feed self;Needs set up Patient  Positioning: Upright in chair Baseline Vocal Quality: Normal Volitional Cough: Strong Volitional Swallow: Able to elicit    Oral/Motor/Sensory Function Overall Oral Motor/Sensory Function: Within functional limits   Ice Chips Ice chips: Not tested   Thin Liquid Thin Liquid: Within functional limits Presentation: Cup;Straw    Nectar Thick Nectar Thick Liquid: Not tested   Honey Thick Honey Thick Liquid: Not tested   Puree Puree: Within functional limits   Solid   GO   Solid: Within functional limits        [name], [name] 01/27/2016,10:38 AM  [name] [name].[employer] [phone]

## 2016-01-27 NOTE — NC FL2 (Signed)
[place] LEVEL OF CARE SCREENING TOOL     IDENTIFICATION  Patient Name: [name] Birthdate: 07/10/1924 Sex: male Admission Date (Current Location): 01/26/2016  [facility] and [place] Number:  Herbalist and Address:  [facility], [place] [address] [place], [place] [address]      Provider Number: [id number]  Attending Physician Name and Address:  Axel Filler, MD  Relative Name and Phone Number:       Current Level of Care: Hospital Recommended Level of Care: [place] Prior Approval Number:    Date Approved/Denied:   PASRR Number: [id number]  Discharge Plan: SNF    Current Diagnoses: Patient Active Problem List   Diagnosis Date Noted  . Gait instability 01/26/2016  . Hypernatremia   . Hematuria 02/26/2015  . Weakness 02/26/2015  . UTI (urinary tract infection) 07/17/2014  . Presence of IVC filter 07/17/2014  . Right knee pain 05/25/2014  . Long term current use of anticoagulant therapy- Xarelto 03/26/2014  . Onychomycosis 01/15/2014  . Vitreous hemorrhage ([place]) 12/21/2013  . Cerebral embolism with cerebral infarction ([place]) 12/19/2013  . Gait abnormality 12/18/2013  . Left adrenal mass ([place]) 09/11/2013  . Orthostatic hypotension 07/10/2013  . CKD (chronic kidney disease) stage 3, GFR 30-59 ml/min 06/23/2013  . Hyperlipidemia   . Normocytic anemia 06/11/2013  . Leg DVT (deep venous thromboembolism), chronic ([place]) 06/10/2013  . Physical deconditioning 05/30/2013  . BPH (benign prostatic hyperplasia) 04/24/2013  . Preoperative examination 04/24/2013  . Primary hyperparathyroidism ([place]) 09/02/2012  . Health care maintenance 08/30/2012  . Frequent falls 06/23/2012  . Thrombotic stroke involving right cerebellar artery ([place]) 09/15/2011  . 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 Hearing loss 07/20/2011  . Meningioma ([place]) 07/20/2011  . HTN (hypertension) 07/19/2011  .  CVA (cerebral infarction) 07/19/2011    Orientation RESPIRATION BLADDER Height & Weight     Self, Place  Normal Continent Weight:   Height:     BEHAVIORAL SYMPTOMS/MOOD NEUROLOGICAL BOWEL NUTRITION STATUS      Continent Diet ( Regular;Thin liquid )   Liquid Administration via: Cup;Straw Medication Administration: Whole meds with liquid Supervision: Patient able to self feed;Intermittent supervision to cue for compensatory strategies Compensations: Slow rate;Small sips/bites Postural Changes: Seated upright at 90 degrees   AMBULATORY STATUS COMMUNICATION OF NEEDS Skin   Extensive Assist Verbally Normal                       Personal Care Assistance Level of Assistance  Bathing, Feeding, Dressing Bathing Assistance: Limited assistance Feeding assistance: Limited assistance Dressing Assistance: Limited assistance     Functional Limitations Info  Sight, Hearing, Speech Sight Info: Adequate Hearing Info: Impaired Speech Info: Impaired    SPECIAL CARE FACTORS FREQUENCY  PT (By licensed PT), OT (By licensed OT)     PT Frequency: 5x OT Frequency: 5x            Contractures Contractures Info: Not present    Additional Factors Info  Code Status, Allergies Code Status Info: Full Code Allergies Info: NKA           Current Medications (01/27/2016):  This is the current hospital active medication list Current Facility-Administered Medications  Medication Dose Route Frequency Provider Last Rate Last Dose  . acetaminophen (TYLENOL) tablet 650 mg  650 mg Oral Q6H PRN Collier Salina, MD       Or  . acetaminophen (TYLENOL) suppository 650  mg  650 mg Rectal Q6H PRN Collier Salina, MD   650 mg at 01/27/16 0255  . atorvastatin (LIPITOR) tablet 20 mg  20 mg Oral q1800 Jule Ser, DO      . [START ON 01/28/2016] cephALEXin (KEFLEX) capsule 500 mg  500 mg Oral Q12H Minus Liberty, MD      . Rivaroxaban Alveda Reasons) tablet 15 mg  15 mg Oral Q supper Jule Ser, DO         Discharge Medications: Please see discharge summary for a list of discharge medications.  Relevant Imaging Results:  Relevant Lab Results:   Additional Information SSN: [id number]  [place], LCSW

## 2016-01-27 NOTE — Progress Notes (Signed)
Patient bladder scanned, bladder scan shows 450cc. Per MD, do not in and out at this time. Recheck in 3-4 hrs.

## 2016-01-27 NOTE — Progress Notes (Signed)
Patient voided 300cc prior to foley insertion. PVR showed 300. MD notified. Per MD, place foley cath. Foley cath placed, patient tolerated well, drain 300 cc dark yellow urine.

## 2016-01-28 DIAGNOSIS — R2681 Unsteadiness on feet: Secondary | ICD-10-CM | POA: Diagnosis not present

## 2016-01-28 DIAGNOSIS — R339 Retention of urine, unspecified: Secondary | ICD-10-CM | POA: Diagnosis not present

## 2016-01-28 DIAGNOSIS — I82509 Chronic embolism and thrombosis of unspecified deep veins of unspecified lower extremity: Secondary | ICD-10-CM | POA: Diagnosis not present

## 2016-01-28 DIAGNOSIS — N390 Urinary tract infection, site not specified: Secondary | ICD-10-CM | POA: Diagnosis not present

## 2016-01-28 DIAGNOSIS — N401 Enlarged prostate with lower urinary tract symptoms: Secondary | ICD-10-CM

## 2016-01-28 DIAGNOSIS — R531 Weakness: Secondary | ICD-10-CM | POA: Diagnosis not present

## 2016-01-28 DIAGNOSIS — R338 Other retention of urine: Secondary | ICD-10-CM

## 2016-01-28 DIAGNOSIS — I10 Essential (primary) hypertension: Secondary | ICD-10-CM | POA: Diagnosis not present

## 2016-01-28 DIAGNOSIS — B9620 Unspecified Escherichia coli [E. coli] as the cause of diseases classified elsewhere: Secondary | ICD-10-CM | POA: Diagnosis not present

## 2016-01-28 DIAGNOSIS — F0150 Vascular dementia without behavioral disturbance: Secondary | ICD-10-CM

## 2016-01-28 DIAGNOSIS — M6281 Muscle weakness (generalized): Secondary | ICD-10-CM | POA: Diagnosis not present

## 2016-01-28 DIAGNOSIS — N3001 Acute cystitis with hematuria: Secondary | ICD-10-CM | POA: Diagnosis not present

## 2016-01-28 DIAGNOSIS — F0390 Unspecified dementia without behavioral disturbance: Secondary | ICD-10-CM

## 2016-01-28 DIAGNOSIS — E785 Hyperlipidemia, unspecified: Secondary | ICD-10-CM | POA: Diagnosis not present

## 2016-01-28 MED ORDER — IRBESARTAN 300 MG PO TABS
300.0000 mg | ORAL_TABLET | Freq: Every day | ORAL | Status: DC
  Administered 2016-01-28: 300 mg via ORAL
  Filled 2016-01-28: qty 1

## 2016-01-28 MED ORDER — CEPHALEXIN 500 MG PO CAPS: 500.0000 mg | ORAL_CAPSULE | Freq: Two times a day (BID) | ORAL | 0 refills | Status: AC

## 2016-01-28 MED ORDER — AMLODIPINE BESYLATE 5 MG PO TABS
5.0000 mg | ORAL_TABLET | Freq: Every day | ORAL | Status: DC
  Administered 2016-01-28: 5 mg via ORAL
  Filled 2016-01-28: qty 1

## 2016-01-28 MED ORDER — METOPROLOL TARTRATE 25 MG PO TABS
25.0000 mg | ORAL_TABLET | Freq: Two times a day (BID) | ORAL | Status: DC
  Administered 2016-01-28: 25 mg via ORAL
  Filled 2016-01-28: qty 1

## 2016-01-28 NOTE — Progress Notes (Signed)
MD is aware of blood in urine.

## 2016-01-28 NOTE — Progress Notes (Signed)
Attempted report 

## 2016-01-28 NOTE — Clinical Social Work Placement (Signed)
   CLINICAL SOCIAL WORK PLACEMENT  NOTE  Date:  01/28/2016  Patient Details  Name: [name] MRN: [id number] Date of Birth: Jul 15, 1924  Clinical Social Work is seeking post-discharge placement for this patient at the [place] level of care (*CSW will initial, date and re-position this form in  chart as items are completed):  Yes   Patient/family provided with [place] Work Department's list of facilities offering this level of care within the geographic area requested by the patient (or if unable, by the patient's family).  Yes   Patient/family informed of their freedom to choose among providers that offer the needed level of care, that participate in Medicare, Medicaid or managed care program needed by the patient, have an available bed and are willing to accept the patient.  Yes   Patient/family informed of [facility]'s ownership interest in [facility] and [facility], as well as of the fact that they are under no obligation to receive care at these facilities.  PASRR submitted to EDS on       PASRR number received on       Existing PASRR number confirmed on 01/27/16     FL2 transmitted to all facilities in geographic area requested by pt/family on 01/27/16     FL2 transmitted to all facilities within larger geographic area on       Patient informed that his/her managed care company has contracts with or will negotiate with certain facilities, including the following:        Yes   Patient/family informed of bed offers received.  Patient chooses bed at [facility]     Physician recommends and patient chooses bed at      Patient to be transferred to [facility] on 01/28/16.  Patient to be transferred to facility by Ambulance     Patient family notified on 01/28/16 of transfer.  Name of family member notified:  [place].     PHYSICIAN Please sign FL2, Please prepare priority discharge summary, including  medications, Please prepare prescriptions     Additional Comment:  Per MD patient ready for DC to [facility]. RN, patient, patient's family, and facility notified of DC. RN given number for report. DC packet on chart. Ambulance transport will be requested for the patient by RN once facility rep notifies her that grandson Tonette Bihari has completed necessary paperwork. CSW signing off.   _______________________________________________ Rigoberto Noel, LCSW 01/28/2016, 5:03 PM

## 2016-01-28 NOTE — Progress Notes (Signed)
Report attempted x2

## 2016-01-28 NOTE — Discharge Instructions (Addendum)
You were admitted to the hospital with a urinary tract infection.  We started treating the UTI with antibiotics, which you will need to continue for 5 days after discharge.  We think you have probably been having urinary tract infections because of difficulties emptying your bladder, so it will be important to follow up with your urologist Dr Gaynelle Arabian.  I have made an appointment for you with Jiles Crocker, NP, who works with Dr Gaynelle Arabian, in 3 weeks.   Urinary Tract Infection, Adult Introduction A urinary tract infection (UTI) is an infection of any part of the urinary tract. The urinary tract includes the:  Kidneys.  Ureters.  Bladder.  Urethra. These organs make, store, and get rid of pee (urine) in the body. Follow these instructions at home:  Take over-the-counter and prescription medicines only as told by your doctor.  If you were prescribed an antibiotic medicine, take it as told by your doctor. Do not stop taking the antibiotic even if you start to feel better.  Avoid the following drinks:  Alcohol.  Caffeine.  Tea.  Carbonated drinks.  Drink enough fluid to keep your pee clear or pale yellow.  Keep all follow-up visits as told by your doctor. This is important.  Make sure to:  Empty your bladder often and completely. Do not to hold pee for long periods of time.  Empty your bladder before and after sex.  Wipe from front to back after a bowel movement if you are male. Use each tissue one time when you wipe. Contact a doctor if:  You have back pain.  You have a fever.  You feel sick to your stomach (nauseous).  You throw up (vomit).  Your symptoms do not get better after 3 days.  Your symptoms go away and then come back. Get help right away if:  You have very bad back pain.  You have very bad lower belly (abdominal) pain.  You are throwing up and cannot keep down any medicines or water. This information is not intended to replace advice given to  you by your health care provider. Make sure you discuss any questions you have with your health care provider. Document Released: 08/16/2007 Document Revised: 08/05/2015 Document Reviewed: 01/18/2015  2017 Elsevier      Information on my medicine - XARELTO (rivaroxaban)  This medication education was reviewed with me or my healthcare representative as part of my discharge preparation.  The pharmacist that spoke with me during my hospital stay was:  Saundra Shelling, [place]? Xarelto was prescribed to treat blood clots that may have been found in the veins of your legs (deep vein thrombosis) or in your lungs (pulmonary embolism) and to reduce the risk of them occurring again.  What do you need to know about Xarelto? The dose is one 20 mg tablet taken ONCE A DAY with your evening meal.  DO NOT stop taking Xarelto without talking to the health care provider who prescribed the medication.  Refill your prescription for 20 mg tablets before you run out.  After discharge, you should have regular check-up appointments with your healthcare provider that is prescribing your Xarelto.  In the future your dose may need to be changed if your kidney function changes by a significant amount.  What do you do if you miss a dose? If you are taking Xarelto TWICE DAILY and you miss a dose, take it as soon as you remember. You may take two 15  mg tablets (total 30 mg) at the same time then resume your regularly scheduled 15 mg twice daily the next day.  If you are taking Xarelto ONCE DAILY and you miss a dose, take it as soon as you remember on the same day then continue your regularly scheduled once daily regimen the next day. Do not take two doses of Xarelto at the same time.   Important Safety Information Xarelto is a blood thinner medicine that can cause bleeding. You should call your healthcare provider right away if you experience any of the following: ? Bleeding  from an injury or your nose that does not stop. ? Unusual colored urine (red or dark brown) or unusual colored stools (red or black). ? Unusual bruising for unknown reasons. ? A serious fall or if you hit your head (even if there is no bleeding).  Some medicines may interact with Xarelto and might increase your risk of bleeding while on Xarelto. To help avoid this, consult your healthcare provider or pharmacist prior to using any new prescription or non-prescription medications, including herbals, vitamins, non-steroidal anti-inflammatory drugs (NSAIDs) and supplements.  This website has more information on Xarelto: [email or web address]-benson.[email or web address]

## 2016-01-28 NOTE — Progress Notes (Signed)
Subjective: He denies any pain and is not bothered by the foley catheter.  No events overnight.  Foley catheter placed yesterday afternoon for urinary retention.  Mental status at baseline per daughter yesterday.  Objective:  Vital signs in last 24 hours: Vitals:   01/27/16 1649 01/27/16 2110 01/28/16 0140 01/28/16 0518  BP: (!) 142/78 (!) 158/86 (!) 142/69 132/78  Pulse: 79 80 84 78  Resp: 16 18 18 18   Temp: 98.4 F (36.9 C) 99.1 F (37.3 C) 98.9 F (37.2 C) 97.9 F (36.6 C)  TempSrc: Oral Oral Oral Oral  SpO2: 95% 95% 93% 94%   Physical Exam  Constitutional: He appears well-developed and well-nourished. No distress.  Cardiovascular: Normal rate and regular rhythm.   Pulmonary/Chest: Effort normal and breath sounds normal.  Abdominal: Soft. He exhibits no distension. There is no tenderness.  Musculoskeletal: He exhibits no edema.  Neurological:  Alert, calm, pleasant, cooperative  Oriented only to person   CBC Latest Ref Rng & Units 01/27/2016 01/26/2016 03/01/2015  WBC 4.0 - 10.5 K/uL 9.1 11.2(H) 8.2  Hemoglobin 13.0 - 17.0 g/dL 11.6(L) 12.1(L) 10.7(L)  Hematocrit 39.0 - 52.0 % 35.2(L) 37.6(L) 34.0(L)  Platelets 150 - 400 K/uL 122(L) 132(L) 184   BMP Latest Ref Rng & Units 01/27/2016 01/26/2016 03/01/2015  Glucose 65 - 99 mg/dL 108(H) 109(H) 103(H)  BUN 6 - 20 mg/dL 13 11 15   Creatinine 0.61 - 1.24 mg/dL 1.23 1.15 1.05  Sodium 135 - 145 mmol/L 139 139 141  Potassium 3.5 - 5.1 mmol/L 3.7 4.2 4.3  Chloride 101 - 111 mmol/L 106 105 108  CO2 22 - 32 mmol/L 25 26 27   Calcium 8.9 - 10.3 mg/dL 10.1 10.2 9.7   Urinalysis    Component Value Date/Time   COLORURINE YELLOW 01/27/2016 0211   APPEARANCEUR CLOUDY (A) 01/27/2016 0211   LABSPEC 1.017 01/27/2016 0211   PHURINE 6.5 01/27/2016 0211   GLUCOSEU NEGATIVE 01/27/2016 0211   HGBUR LARGE (A) 01/27/2016 0211   BILIRUBINUR NEGATIVE 01/27/2016 0211   KETONESUR NEGATIVE 01/27/2016 0211   PROTEINUR 30 (A) 01/27/2016  0211   UROBILINOGEN 1.0 07/09/2014 1503   NITRITE POSITIVE (A) 01/27/2016 0211   LEUKOCYTESUR LARGE (A) 01/27/2016 0211   Head CT without contrast 01/26/2016 IMPRESSION: Chronic right cerebellar infarct with encephalomalacia. Chronic moderate to marked periventricular and subcortical white matter small vessel ischemic disease.  Superficial and central atrophy.  Stable left sphenoid wing meningioma.  No acute CT abnormality identified.  Assessment/Plan:  Principal Problem:   Gait instability Active Problems:   HTN (hypertension)   Hearing loss   Meningioma (HCC)   Leg DVT (deep venous thromboembolism), chronic (HCC)   CKD (chronic kidney disease) stage 3, GFR 30-59 ml/min   Cerebral embolism with cerebral infarction ([place])   Long term current use of anticoagulant therapy- Xarelto   UTI (urinary tract infection)   Presence of IVC filter   Weakness  #Urinary Tract Infection Improving.  Presented febrile with urinalysis with pyuria and bacteruria.  Urine grew E Coli resistant to ampicillin and unasyn in 02/2015.  Hemodynamically stable, responding well to treatment, afebrile and leukocytosis has resolved.  Mental status at baseline. -Complete 7 days treatment with cephalexin 500 mg BID (end 11/22) -F/u blood and urine cultures  #BPH Random bladder scan with 400 mL yesterday 11/16, produced ~300 mL urine into condom cath over next couple of hours but additional 300 mL in bladder when catheterized.  Placed foley catheter with concern for urinary retention as  cause of his UTIs.  He has had a TURP ~2 years ago, but has not been any meds for BPH symptoms.  His retention is probably chronic rather than acute, and he is able to pass some urine.  Both indwelling catheter and chronic retention are risk factors for further UTIs, but with close urology follow-up the catheter may be there higher risk. -Remove foley  #HTN On Metoprolol 25 mg BID, Amlodipine 5 mg daily and Irbesartan 300  mg daily, held at admission. -Restart home meds  #Gait Instability #History of CVA His gait instability is consistent is likely due to unmasking of chronic deficts from his prior cerebellar stroke due to his infection.  Improving with antibiotic treatment, normal head CT, and no new deficits to suggest new stroke. -PT/OT -Will need SNF  #Chronic LE DVT w/ IVC Filter -Continue home rivaroxaban 15 mg daily  Dispo: Anticipated discharge today.  Minus Liberty, MD 01/28/2016, 7:15 AM Pager: [phone]

## 2016-01-28 NOTE — Discharge Summary (Signed)
Name: [name] MRN: [id number] DOB: 1924-06-26 82 y.o. PCP: [name], DO  Date of Admission: 01/26/2016  6:31 PM Date of Discharge: 01/28/2016 Attending Physician: Axel Filler, MD  Discharge Diagnosis: 1. Urinary Tract Infection 2. Urinary Retention 3. History of CVA 4. HTN  Principal Problem:   UTI (urinary tract infection) Active Problems:   HTN (hypertension)   Hearing loss   Meningioma (HCC)   Leg DVT (deep venous thromboembolism), chronic (HCC)   Urinary retention   CKD (chronic kidney disease) stage 3, GFR 30-59 ml/min   Long term current use of anticoagulant therapy- Xarelto   Presence of IVC filter   Weakness   Gait instability   Dementia   Discharge Medications:   Medication List    TAKE these medications   amLODipine 5 MG tablet Commonly known as:  NORVASC Take 1 tablet (5 mg total) by mouth daily.   atorvastatin 20 MG tablet Commonly known as:  LIPITOR Take 1 tablet (20 mg total) by mouth daily at 6 PM.   cephALEXin 500 MG capsule Commonly known as:  KEFLEX Take 1 capsule (500 mg total) by mouth every 12 (twelve) hours.   irbesartan 300 MG tablet Commonly known as:  AVAPRO Take 1 tablet (300 mg total) by mouth daily.   metoprolol tartrate 25 MG tablet Commonly known as:  LOPRESSOR Take 1 tablet (25 mg total) by mouth 2 (two) times daily.   Rivaroxaban 15 MG Tabs tablet Commonly known as:  XARELTO take 1 tablet by mouth once daily WITH SUPPER       Disposition and follow-up:   [name] was discharged from [facility] in Stable condition.  At the hospital follow up visit please address:  1.  UTI.  Verify resolution of symptoms of infection.  May be unable to report dysuria; mental status and gait were perturbed by his UTI.  2.  BPH with Chronic Urinary Retention.  His multiple UTIs are probably due to stasis and retention from BPH.  Urology follow-up arranged.  If he is not able to void,  may require intermittent or indwelling catheterization again until urology can see him.  3.  Labs / imaging needed at time of follow-up: none  4.  Pending labs/ test needing follow-up: blood cultures, urine culture  Follow-up Appointments: Follow-up Information    Jimmy Kays, NP Follow up in 20 day(s).   Specialty:  Nurse Practitioner Why:  at 11 am Contact information: [address] [place] [address] [place] [facility] Course by problem list: Principal Problem:   UTI (urinary tract infection) Active Problems:   HTN (hypertension)   Hearing loss   Meningioma (HCC)   Leg DVT (deep venous thromboembolism), chronic (HCC)   Urinary retention   CKD (chronic kidney disease) stage 3, GFR 30-59 ml/min   Long term current use of anticoagulant therapy- Xarelto   Presence of IVC filter   Weakness   Gait instability   Dementia   1. Urinary Tract Infection [name] presented to the [facility] with gait instability and mild worsening confusion for several days.  He has dementia of unspecified type, and his daughter, who is his primary caregiver, was concerned that he had a UTI because with prior UTIs he had these symptoms.  He was febrile, had mild leukocytosis, and was found to have pyuria and bacteruria.  He was treated with  one dose of ceftriaxone before being transitioned to cephalexin to complete 7 days of antibiosis (end 11/22).  He most recently had a E Coli (resistent to amp and amp/sulbactam) UTI in 02/2015.  Per his daughter, his mental status was at baseline.  Chronic urinary retention secondary to BPH is the most likely explanation for UTIs in this elderly man with history of BPH.  2. Urinary Retention He has a history of BPH s/p TURP in 2015 by Dr [name] and 3 UTIs in past 2 years.  With concern for chronic urinary retention, he was bladder scanned after admission and found to have 450 mL bladder volume.  He was  able to avoid 300 mL over the following hour into a condom catheter, but foley was placed and drained an additional 300 mL.  Foley was removed before discharged and follow-up with urology arranged.  3. Gait Instability He has a history of R cerebellar stroke 2 years ago, and head CT showed no new abnormalities at admission.  His gait instability with rightward leaning localize well to the right cerebellar hemisphere, and his symptoms were deemed to be due to exacerbation of chronic neurologic deficits due to his infection.  PT and OT recommended SNF for further therapy.  4. HTN Home antihypertensives were held on admission, but resumed Metoprolol 25 mg BID, Amlodipine 5 mg daily and Irbesartan 300 mg daily on day of discharge after he became normotensive and infection was treated.  5. Chronic LE DVT w/ IVC Filter No bleeding or clotting, continued on home rivaroxaban 15 mg daily  Discharge Vitals:   BP 137/87 (BP Location: Right Arm)   Pulse 78   Temp 97.8 F (36.6 C) (Oral)   Resp 18   SpO2 94%   Pertinent Labs, Studies, and Procedures:   CBC Latest Ref Rng & Units 01/27/2016 01/26/2016 03/01/2015  WBC 4.0 - 10.5 K/uL 9.1 11.2(H) 8.2  Hemoglobin 13.0 - 17.0 g/dL 11.6(L) 12.1(L) 10.7(L)  Hematocrit 39.0 - 52.0 % 35.2(L) 37.6(L) 34.0(L)  Platelets 150 - 400 K/uL 122(L) 132(L) 184   BMP Latest Ref Rng & Units 01/27/2016 01/26/2016 03/01/2015  Glucose 65 - 99 mg/dL 108(H) 109(H) 103(H)  BUN 6 - 20 mg/dL 13 11 15   Creatinine 0.61 - 1.24 mg/dL 1.23 1.15 1.05  Sodium 135 - 145 mmol/L 139 139 141  Potassium 3.5 - 5.1 mmol/L 3.7 4.2 4.3  Chloride 101 - 111 mmol/L 106 105 108  CO2 22 - 32 mmol/L 25 26 27   Calcium 8.9 - 10.3 mg/dL 10.1 10.2 9.7   Urinalysis    Component Value Date/Time   COLORURINE YELLOW 01/27/2016 0211   APPEARANCEUR CLOUDY (A) 01/27/2016 0211   LABSPEC 1.017 01/27/2016 0211   PHURINE 6.5 01/27/2016 0211   GLUCOSEU NEGATIVE 01/27/2016 0211   HGBUR LARGE (A)  01/27/2016 0211   BILIRUBINUR NEGATIVE 01/27/2016 0211   KETONESUR NEGATIVE 01/27/2016 0211   PROTEINUR 30 (A) 01/27/2016 0211   UROBILINOGEN 1.0 07/09/2014 1503   NITRITE POSITIVE (A) 01/27/2016 0211   LEUKOCYTESUR LARGE (A) 01/27/2016 0211   CT Head without Contrast 01/26/2016 IMPRESSION: Chronic right cerebellar infarct with encephalomalacia. Chronic moderate to marked periventricular and subcortical white matter small vessel ischemic disease.  Superficial and central atrophy.  Stable left sphenoid wing meningioma.  No acute CT abnormality identified.  Bladder scan 01/27/16 450 mL  Blood Cultures x2 01/27/16 Pending  Urine Culture 01/27/16 Pending  Discharge Instructions: Discharge Instructions    Diet - low sodium heart healthy  Complete by:  As directed    Increase activity slowly    Complete by:  As directed     You were admitted to the hospital with a urinary tract infection.  We started treating the UTI with antibiotics, which you will need to continue for 5 days after discharge.  We think you have probably been having urinary tract infections because of difficulties emptying your bladder, so we placed foley catheter.  It will be important to follow up with your urologist Dr [name].  I have made an appointment for you with Jiles Crocker, NP, who works with Dr [name], in 3 weeks.  Signed: Minus Liberty, MD 01/28/2016, 10:19 AM   Pager: [phone]

## 2016-01-28 NOTE — Progress Notes (Signed)
Transport called for patient  °

## 2016-01-29 LAB — URINE CULTURE
Culture: >=100000 — AB
Special Requests: NORMAL

## 2016-01-31 ENCOUNTER — Telehealth: Payer: Self-pay

## 2016-01-31 ENCOUNTER — Encounter: Payer: Self-pay | Admitting: Adult Health

## 2016-01-31 ENCOUNTER — Non-Acute Institutional Stay (SKILLED_NURSING_FACILITY): Payer: Medicare Other | Admitting: Adult Health

## 2016-01-31 DIAGNOSIS — I82509 Chronic embolism and thrombosis of unspecified deep veins of unspecified lower extremity: Secondary | ICD-10-CM

## 2016-01-31 DIAGNOSIS — D638 Anemia in other chronic diseases classified elsewhere: Secondary | ICD-10-CM | POA: Diagnosis not present

## 2016-01-31 DIAGNOSIS — I10 Essential (primary) hypertension: Secondary | ICD-10-CM | POA: Diagnosis not present

## 2016-01-31 DIAGNOSIS — N183 Chronic kidney disease, stage 3 unspecified: Secondary | ICD-10-CM

## 2016-01-31 DIAGNOSIS — E785 Hyperlipidemia, unspecified: Secondary | ICD-10-CM

## 2016-01-31 DIAGNOSIS — R2681 Unsteadiness on feet: Secondary | ICD-10-CM

## 2016-01-31 DIAGNOSIS — R338 Other retention of urine: Secondary | ICD-10-CM

## 2016-01-31 DIAGNOSIS — N3001 Acute cystitis with hematuria: Secondary | ICD-10-CM | POA: Diagnosis not present

## 2016-01-31 DIAGNOSIS — N401 Enlarged prostate with lower urinary tract symptoms: Secondary | ICD-10-CM

## 2016-01-31 NOTE — Progress Notes (Signed)
Internal Medicine Clinic Attending  I saw and evaluated the patient.  I personally confirmed the key portions of the history and exam documented by Dr. Hoffman and I reviewed pertinent patient test results.  The assessment, diagnosis, and plan were formulated together and I agree with the documentation in the resident's note.      

## 2016-01-31 NOTE — Progress Notes (Signed)
DATE:  01/31/2016   MRN:  [id number]  BIRTHDAY: 05/12/1924  Facility:  [facility]ursing Home Location:  [place] Room Number: 1101-A  LEVEL OF CARE:  SNF (31)  Contact Information    Name Relation Home Work Mobile   [place] Grandaughter   [phone]   [place] Daughter [phone]       Code Status History    Date Active Date Inactive Code Status Order ID Comments User Context   01/26/2016  6:58 PM 01/29/2016 12:46 AM Full Code [id number]  Collier Salina, MD Inpatient   02/26/2015  5:44 PM 03/01/2015  7:02 PM Full Code [id number]  Milagros Loll, MD Inpatient   12/18/2013 11:44 PM 12/22/2013  6:42 PM Full Code [id number]  Jones Bales, MD Inpatient   07/08/2013  5:56 PM 07/10/2013  8:24 PM Full Code [id number]  Otho Bellows, MD Inpatient   06/16/2013 10:47 AM 06/20/2013  7:25 PM Full Code [id number]  Azzie Roup, MD Inpatient   06/10/2013  8:20 PM 06/16/2013 10:47 AM Full Code [id number]  Otho Bellows, MD Inpatient   05/19/2013  4:03 PM 05/21/2013  4:38 PM Full Code [id number]  Marcia Brash, MD Inpatient   06/24/2012  1:17 AM 06/26/2012  9:26 PM Full Code [id number]  Orvan Falconer, MD Inpatient       Chief Complaint  Patient presents with  . Hospitalization Follow-up    HISTORY OF PRESENT ILLNESS:  This is an 82 year old male who has been admitted to [facility] on 01/28/16 from [facility] hospitalization 01/26/16 to 01/28/16.  He has PMH of history of CVA, recurrent UTIs due to bladder outlet obstruction caused by BPH and S/P TURP in 2015. He was treated in the hospital for UTI with 1 dose of ceftriaxone then transition to cephalexin.  He has been admitted for a short-term rehabilitation.  PAST MEDICAL HISTORY:  Past Medical History:  Diagnosis Date  . Anemia   . BPH (benign prostatic hypertrophy)    TURP 05/19/13  . Chronic kidney disease    CHRONIC KIDNEY DISEASE, 2  . Difficulty  hearing, right    BILATERAL HEARING LOSS - BEST TO TRY TO SPEAK INTO LEFT EAR  . DVT (deep venous thrombosis) ([place])   . Frequent falls   . Hyperlipidemia   . Hypertension   . Incontinence of urine    SOME INCONTINENCE  . Meningioma ([place])   . Stroke ([place])    Cerebellar, 2013; WALKS WITH WALKER, ABLE TO DRESS AND BATHE HIMSELF BUT FAMILY TRIES TO PROVIDE SUPERVISION BECAUSE OF HIS HX OF FALL AND WEAKNESS LEGS, ARMS   . Thrombocytopenia (HCC)      CURRENT MEDICATIONS: Reviewed  Patient's Medications  New Prescriptions   No medications on file  Previous Medications   AMLODIPINE (NORVASC) 5 MG TABLET    Take 1 tablet (5 mg total) by mouth daily.   ATORVASTATIN (LIPITOR) 20 MG TABLET    Take 1 tablet (20 mg total) by mouth daily at 6 PM.   CEPHALEXIN (KEFLEX) 500 MG CAPSULE    Take 1 capsule (500 mg total) by mouth every 12 (twelve) hours.   LOSARTAN (COZAAR) 100 MG TABLET    Take 100 mg by mouth daily.   METOPROLOL TARTRATE (LOPRESSOR) 25 MG TABLET    Take 1 tablet (25 mg total) by mouth 2 (two) times daily.   RIVAROXABAN (XARELTO) 15 MG TABS TABLET    take 1 tablet  by mouth once daily WITH SUPPER  Modified Medications   No medications on file  Discontinued Medications   IRBESARTAN (AVAPRO) 300 MG TABLET    Take 1 tablet (300 mg total) by mouth daily.     No Known Allergies   REVIEW OF SYSTEMS:  GENERAL: no change in appetite, no fatigue, no weight changes, no fever, chills or weakness EYES: Denies change in vision, dry eyes, eye pain, itching or discharge EARS: Denies change in hearing, ringing in ears, or earache NOSE: Denies nasal congestion or epistaxis MOUTH and THROAT: Denies oral discomfort, gingival pain or bleeding, pain from teeth or hoarseness   RESPIRATORY: no cough, SOB, DOE, wheezing, hemoptysis CARDIAC: no chest pain, edema or palpitations GI: no abdominal pain, diarrhea, constipation, heart burn, nausea or vomiting GU: Denies dysuria, frequency, hematuria,  incontinence, or discharge PSYCHIATRIC: Denies feeling of depression or anxiety. No report of hallucinations, insomnia, paranoia, or agitation    PHYSICAL EXAMINATION  GENERAL APPEARANCE: Well nourished. In no acute distress. Normal body habitus SKIN:  Skin is warm and dry.  HEAD: Normal in size and contour. No evidence of trauma EYES: Lids open and close normally. No blepharitis, entropion or ectropion. PERRL. Conjunctivae are clear and sclerae are white. Lenses are without opacity EARS: Pinnae are normal. Has healing aide on left ear MOUTH and THROAT: Lips are without lesions. Oral mucosa is moist and without lesions. Tongue is normal in shape, size, and color and without lesions NECK: supple, trachea midline, no neck masses, no thyroid tenderness, no thyromegaly LYMPHATICS: no LAN in the neck, no supraclavicular LAN RESPIRATORY: breathing is even & unlabored, BS CTAB CARDIAC: RRR, no murmur,no extra heart sounds, no edema GI: abdomen soft, normal BS, no masses, no tenderness, no hepatomegaly, no splenomegaly PSYCHIATRIC: Alert to person and place, disoriented to time. Affect and behavior are appropriate  LABS/RADIOLOGY: Labs reviewed: Basic Metabolic Panel:  Recent Labs  02/26/15 1912  03/01/15 0607 01/26/16 2030 01/27/16 0311  NA 148*  < > 141 139 139  K 3.9  < > 4.3 4.2 3.7  CL 114*  < > 108 105 106  CO2 26  < > 27 26 25   GLUCOSE 119*  < > 103* 109* 108*  BUN 18  < > 15 11 13   CREATININE 1.26*  < > 1.05 1.15 1.23  CALCIUM 10.3  < > 9.7 10.2 10.1  MG 1.9  --  1.8  --   --   PHOS 2.6  --   --   --   --   < > = values in this interval not displayed. Liver Function Tests:  Recent Labs  02/26/15 1912 01/26/16 2352  AST 28 18  ALT 19 11*  ALKPHOS 51 53  BILITOT 1.1 1.0  PROT 6.9 6.6  ALBUMIN 3.0* 3.1*   CBC:  Recent Labs  02/26/15 1912  03/01/15 0607 01/26/16 2030 01/27/16 0311  WBC 9.4  < > 8.2 11.2* 9.1  NEUTROABS 7.1  --   --   --  6.7  HGB 11.9*  < >  10.7* 12.1* 11.6*  HCT 37.4*  < > 34.0* 37.6* 35.2*  MCV 97.9  < > 93.7 90.6 90.0  PLT 169  < > 184 132* 122*  < > = values in this interval not displayed.  CBG:  Recent Labs  02/27/15 0756  GLUCAP 107*      Dg Chest 2 View  Result Date: 01/26/2016 CLINICAL DATA:  Cough EXAM: CHEST  2 VIEW  COMPARISON:  07/09/2014 FINDINGS: There is stable cardiomegaly with tortuous appearing thoracic aorta. Mild pulmonary vascular congestion. No pneumonic consolidation, effusion or pneumothorax. IMPRESSION: Stable cardiomegaly with mild vascular congestion. Electronically Signed   By: Ashley Royalty M.D.   On: 01/26/2016 22:25   Ct Head Wo Contrast  Result Date: 01/26/2016 CLINICAL DATA:  Ataxia, 3 day history of leaning to the right side that became more pronounced this morning. EXAM: CT HEAD WITHOUT CONTRAST TECHNIQUE: Contiguous axial images were obtained from the base of the skull through the vertex without intravenous contrast. COMPARISON:  02/27/2015 CT head, 12/19/2013 MRI of the head FINDINGS: Brain: There is moderate ventricular and mild sulcal prominence as before consistent with superficial and central atrophy. Extensive periventricular and subcortical white matter hypodensity consistent with chronic small vessel ischemic disease. Remote lacunar infarct is noted at the right thalamus. Confluent area of encephalomalacia involving the right cerebellar hemisphere consistent with remote cerebellar infarct. No evidence for new acute large vascular territory infarction. No acute intracranial hemorrhage. No adjacent edema. No new mass lesions can extra-axial fluid collections. Vascular: No hyperdense vessels. Skull: No fracture or bone destruction. Large calcified left sphenoid wing meningioma estimated at 2.5 x 2.8 cm, without significant change. Sinuses/Orbits: The mastoids are clear as are the visualized paranasal sinuses. The globes are symmetric in appearance. Other: The scalp and soft tissues are  unremarkable IMPRESSION: Chronic right cerebellar infarct with encephalomalacia. Chronic moderate to marked periventricular and subcortical white matter small vessel ischemic disease. Superficial and central atrophy. Stable left sphenoid wing meningioma. No acute CT abnormality identified. Electronically Signed   By: Ashley Royalty M.D.   On: 01/26/2016 22:32    ASSESSMENT/PLAN:  Gait instability - has history of cerebellar stroke 2 years ago, head CT showed no new abnormalities; will have rehabilitation, PT and OT, for therapeutic strengthening exercises; fall precaution  UTI - was given 1 dose of ceftriaxone and then transition to cephalexin  BPH with urinary retention - S/P TURP in 2015; Foley catheter was removed before discharge; follow-up with urology, Dr. Gaynelle Arabian, in 3 weeks; monitory for urinary retention  Hypertension - continue metoprolol 25 mg twice a day, amlodipine 5 mg daily and losartan 100 mg 1 tab by mouth daily; check BMP  Chronic lower extremity DVT - with IVF filter; continue Xarelto 15 mg daily  Hyperlipidemia - continue atorvastatin 20 mg 1 tab by mouth every 6 p.m.  Anemia of chronic disease -  recheck CBC Lab Results  Component Value Date   HGB 11.6 (L) 01/27/2016   Chronic kidney disease, stage III - GFR 50 ; will monitor Lab Results  Component Value Date   CREATININE 1.23 01/27/2016         Durenda Age - NP [employer] [phone]

## 2016-02-01 ENCOUNTER — Telehealth: Payer: Self-pay

## 2016-02-01 ENCOUNTER — Encounter: Payer: Self-pay | Admitting: Internal Medicine

## 2016-02-01 ENCOUNTER — Non-Acute Institutional Stay (SKILLED_NURSING_FACILITY): Payer: Medicare Other | Admitting: Internal Medicine

## 2016-02-01 DIAGNOSIS — N390 Urinary tract infection, site not specified: Secondary | ICD-10-CM

## 2016-02-01 DIAGNOSIS — F0150 Vascular dementia without behavioral disturbance: Secondary | ICD-10-CM | POA: Diagnosis not present

## 2016-02-01 DIAGNOSIS — N183 Chronic kidney disease, stage 3 unspecified: Secondary | ICD-10-CM

## 2016-02-01 DIAGNOSIS — I10 Essential (primary) hypertension: Secondary | ICD-10-CM | POA: Diagnosis not present

## 2016-02-01 DIAGNOSIS — E785 Hyperlipidemia, unspecified: Secondary | ICD-10-CM | POA: Diagnosis not present

## 2016-02-01 DIAGNOSIS — R3914 Feeling of incomplete bladder emptying: Secondary | ICD-10-CM

## 2016-02-01 DIAGNOSIS — Z8673 Personal history of transient ischemic attack (TIA), and cerebral infarction without residual deficits: Secondary | ICD-10-CM

## 2016-02-01 DIAGNOSIS — D696 Thrombocytopenia, unspecified: Secondary | ICD-10-CM

## 2016-02-01 DIAGNOSIS — I82509 Chronic embolism and thrombosis of unspecified deep veins of unspecified lower extremity: Secondary | ICD-10-CM

## 2016-02-01 DIAGNOSIS — I69993 Ataxia following unspecified cerebrovascular disease: Secondary | ICD-10-CM | POA: Diagnosis not present

## 2016-02-01 DIAGNOSIS — N401 Enlarged prostate with lower urinary tract symptoms: Secondary | ICD-10-CM

## 2016-02-01 DIAGNOSIS — B9620 Unspecified Escherichia coli [E. coli] as the cause of diseases classified elsewhere: Secondary | ICD-10-CM

## 2016-02-01 DIAGNOSIS — R531 Weakness: Secondary | ICD-10-CM

## 2016-02-01 DIAGNOSIS — D638 Anemia in other chronic diseases classified elsewhere: Secondary | ICD-10-CM

## 2016-02-01 LAB — CULTURE, BLOOD (ROUTINE X 2)
Culture: NO GROWTH
Culture: NO GROWTH

## 2016-02-01 NOTE — Progress Notes (Signed)
LOCATION: [place]  PCP: [name], DO   Code Status: Full Code  Goals of care: Advanced Directive information Advanced Directives 01/26/2016  Does Patient Have a Medical Advance Directive? Yes  Type of Paramedic of [place];Living will  Does patient want to make changes to medical advance directive? No - Patient declined  Copy of [place] in Chart? No - copy requested  Would patient like information on creating a medical advance directive? -  Pre-existing out of facility DNR order (yellow form or pink MOST form) -       Extended Emergency Contact Information Primary Emergency Contact: Rembert,Raestarsha Address: [address]          [place], [place] [address] [place] of [employer] Phone: [phone] Relation: [place] Secondary Emergency Contact: Rembert,[name] Address: [address]          [place], [place] [address] Johnnette Litter of [place] Phone: [phone] Work Phone: [phone] Mobile Phone: [phone] Relation: Daughter   No Known Allergies  Chief Complaint  Patient presents with  . New Admit To SNF    New Admission Visit     HPI:  Patient is a 82 y.o. male seen today for short term rehabilitation post hospital admission from 01/26/16-01/28/16 with E.coli UTI. He was started on antibiotic. His foley catheter was removed and he is pending outpatient urology follow up.  He has PMH of chronic urinary retention with BPH, stroke, HTN, HLD, CKD among others. He is seen in his room today.   Review of Systems:  Constitutional: Negative for fever, chills, diaphoresis.  HENT: Negative for headache, congestion, nasal discharge, difficulty swallowing.   Eyes: Negative for double vision and discharge.  Respiratory: Negative for cough, shortness of breath and wheezing.   Cardiovascular: Negative for chest pain, palpitations, leg swelling.  Gastrointestinal: Negative for heartburn, nausea,  vomiting, abdominal pain. Genitourinary: Negative for dysuria. Musculoskeletal: Negative for fall.  Skin: Negative for itching Neurological: Negative for dizziness. Psychiatric/Behavioral: Negative for depression   Past Medical History:  Diagnosis Date  . Anemia   . BPH (benign prostatic hypertrophy)    TURP 05/19/13  . Chronic kidney disease    CHRONIC KIDNEY DISEASE, 2  . Difficulty hearing, right    BILATERAL HEARING LOSS - BEST TO TRY TO SPEAK INTO LEFT EAR  . DVT (deep venous thrombosis) ([place])   . Frequent falls   . Hyperlipidemia   . Hypertension   . Incontinence of urine    SOME INCONTINENCE  . Meningioma ([place])   . Stroke ([place])    Cerebellar, 2013; WALKS WITH WALKER, ABLE TO DRESS AND BATHE HIMSELF BUT FAMILY TRIES TO PROVIDE SUPERVISION BECAUSE OF HIS HX OF FALL AND WEAKNESS LEGS, ARMS   . Thrombocytopenia ([place])    Past Surgical History:  Procedure Laterality Date  . CYSTOSCOPY N/A 06/13/2013   Procedure: CYSTOSCOPY FLEXIBLE BEDSIDE;  Surgeon: Ardis Hughs, MD;  Location: [facility];  Service: Urology;  Laterality: N/A;  . MYRINGOTOMY WITH TUBE PLACEMENT Bilateral   . TRANSURETHRAL RESECTION OF PROSTATE N/A 05/19/2013   Procedure: TRANSURETHRAL RESECTION OF THE PROSTATE WITH GYRUS INSTRUMENTS;  Surgeon: Ailene Rud, MD;  Location: [facility];  Service: Urology;  Laterality: N/A;   Social History:   reports that he has never smoked. He has never used smokeless tobacco. He reports that he does not drink alcohol or use drugs.  Family History  Problem Relation Age of Onset  . Diabetes Mother   .  Heart disease Mother   . Hypertension Mother   . Heart disease Father   . Hypertension Father   . Hypertension Daughter     Medications:   Medication List       Accurate as of 02/01/16  1:07 PM. Always use your most recent med list.          amLODipine 5 MG tablet Commonly known as:  NORVASC Take 1 tablet (5 mg total) by mouth daily.   atorvastatin 20 MG  tablet Commonly known as:  LIPITOR Take 1 tablet (20 mg total) by mouth daily at 6 PM.   cephALEXin 500 MG capsule Commonly known as:  KEFLEX Take 1 capsule (500 mg total) by mouth every 12 (twelve) hours.   losartan 100 MG tablet Commonly known as:  COZAAR Take 100 mg by mouth daily.   metoprolol tartrate 25 MG tablet Commonly known as:  LOPRESSOR Take 1 tablet (25 mg total) by mouth 2 (two) times daily.   Rivaroxaban 15 MG Tabs tablet Commonly known as:  XARELTO take 1 tablet by mouth once daily WITH SUPPER       Immunizations: Immunization History  Administered Date(s) Administered  . Influenza,inj,Quad PF,36+ Mos 02/03/2013, 01/15/2014  . PPD Test 01/28/2016  . Pneumococcal Polysaccharide-23 02/03/2013     Physical Exam:  Vitals:   02/01/16 1305  BP: 116/75  Pulse: 89  Resp: 18  Temp: 97.3 F (36.3 C)  TempSrc: Oral  SpO2: 97%  Weight: 179 lb 6.4 oz (81.4 kg)  Height: 6' (1.829 m)   Body mass index is 24.33 kg/m.  General- elderly male, well built, in no acute distress Head- normocephalic, atraumatic Nose- no maxillary or frontal sinus tenderness, no nasal discharge Throat- moist mucus membrane, missing teeth Eyes- PERRLA, EOMI, no pallor, no icterus Neck- no cervical lymphadenopathy Cardiovascular- normal s1,s2, no murmur Respiratory- bilateral clear to auscultation, no wheeze, no rhonchi, no crackles, no use of accessory muscles Abdomen- bowel sounds present, soft, non tender Musculoskeletal- able to move all 4 extremities, generalized weakness most prominent in LUE.  Neurological- alert and oriented to person and place but not to time, dysarthria and has hearing loss Skin- warm and dry Psychiatry- normal mood and affect    Labs reviewed: Basic Metabolic Panel:  Recent Labs  02/26/15 1912  03/01/15 0607 01/26/16 2030 01/27/16 0311  NA 148*  < > 141 139 139  K 3.9  < > 4.3 4.2 3.7  CL 114*  < > 108 105 106  CO2 26  < > 27 26 25     GLUCOSE 119*  < > 103* 109* 108*  BUN 18  < > 15 11 13   CREATININE 1.26*  < > 1.05 1.15 1.23  CALCIUM 10.3  < > 9.7 10.2 10.1  MG 1.9  --  1.8  --   --   PHOS 2.6  --   --   --   --   < > = values in this interval not displayed. Liver Function Tests:  Recent Labs  02/26/15 1912 01/26/16 2352  AST 28 18  ALT 19 11*  ALKPHOS 51 53  BILITOT 1.1 1.0  PROT 6.9 6.6  ALBUMIN 3.0* 3.1*   No results for input(s): LIPASE, AMYLASE in the last 8760 hours. No results for input(s): AMMONIA in the last 8760 hours. CBC:  Recent Labs  02/26/15 1912  03/01/15 0607 01/26/16 2030 01/27/16 0311  WBC 9.4  < > 8.2 11.2* 9.1  NEUTROABS 7.1  --   --   --  6.7  HGB 11.9*  < > 10.7* 12.1* 11.6*  HCT 37.4*  < > 34.0* 37.6* 35.2*  MCV 97.9  < > 93.7 90.6 90.0  PLT 169  < > 184 132* 122*  < > = values in this interval not displayed. Cardiac Enzymes: No results for input(s): CKTOTAL, CKMB, CKMBINDEX, TROPONINI in the last 8760 hours. BNP: Invalid input(s): POCBNP CBG:  Recent Labs  02/27/15 0756  GLUCAP 107*    Radiological Exams: Dg Chest 2 View  Result Date: 01/26/2016 CLINICAL DATA:  Cough EXAM: CHEST  2 VIEW COMPARISON:  07/09/2014 FINDINGS: There is stable cardiomegaly with tortuous appearing thoracic aorta. Mild pulmonary vascular congestion. No pneumonic consolidation, effusion or pneumothorax. IMPRESSION: Stable cardiomegaly with mild vascular congestion. Electronically Signed   By: Ashley Royalty M.D.   On: 01/26/2016 22:25   Ct Head Wo Contrast  Result Date: 01/26/2016 CLINICAL DATA:  Ataxia, 3 day history of leaning to the right side that became more pronounced this morning. EXAM: CT HEAD WITHOUT CONTRAST TECHNIQUE: Contiguous axial images were obtained from the base of the skull through the vertex without intravenous contrast. COMPARISON:  02/27/2015 CT head, 12/19/2013 MRI of the head FINDINGS: Brain: There is moderate ventricular and mild sulcal prominence as before consistent  with superficial and central atrophy. Extensive periventricular and subcortical white matter hypodensity consistent with chronic small vessel ischemic disease. Remote lacunar infarct is noted at the right thalamus. Confluent area of encephalomalacia involving the right cerebellar hemisphere consistent with remote cerebellar infarct. No evidence for new acute large vascular territory infarction. No acute intracranial hemorrhage. No adjacent edema. No new mass lesions can extra-axial fluid collections. Vascular: No hyperdense vessels. Skull: No fracture or bone destruction. Large calcified left sphenoid wing meningioma estimated at 2.5 x 2.8 cm, without significant change. Sinuses/Orbits: The mastoids are clear as are the visualized paranasal sinuses. The globes are symmetric in appearance. Other: The scalp and soft tissues are unremarkable IMPRESSION: Chronic right cerebellar infarct with encephalomalacia. Chronic moderate to marked periventricular and subcortical white matter small vessel ischemic disease. Superficial and central atrophy. Stable left sphenoid wing meningioma. No acute CT abnormality identified. Electronically Signed   By: Ashley Royalty M.D.   On: 01/26/2016 22:32    Assessment/Plan  Generalized weakness Will have him work with physical therapy and occupational therapy team to help with gait training and muscle strengthening exercises.fall precautions. Skin care. Encourage to be out of bed.   E.coli UTI Continue and complete course of keflex 500 mg q12h on 02/02/16. Hydration to be maintained  Ataxia Late effect of cva, fall precautions. Will have patient work with PT/OT as tolerated to regain strength and restore function.  Fall precautions are in place.  ckd stage 3 Monitor bmp  Vascular dementia Stable  Provide supportive care. SLP consult  Anemia of chronic disease Monitor cbc  Thrombocytopenia No bleed reported, monitor cbc  Hypertension Stable, monitor BP. Continue  losartan 100 mg daily, metoprolol 25 mg bid and amlodipine 5 mg daily  BPH with urinary retention Able to void at present, will need urology follow up to evaluate further. Has history of TURP.   Chronic DVT S/p IVC filter. Continue rivaroxaban  HLD Continue atorvastatin  History of cva Continue statin and bp medication with xarelto    Goals of care: short term rehabilitation   Labs/tests ordered: cbc, bmp  Family/ staff Communication: reviewed care plan with patient and nursing supervisor    Blanchie Serve, MD Internal Medicine [facility] [place], [place] [address] Cell Phone (Monday-Friday 8 am - 5 pm): [phone] On Call: [phone] and follow prompts after 5 pm and on weekends Office Phone: [phone] Office Fax: [phone]

## 2016-02-01 NOTE — Telephone Encounter (Signed)
appt made for HFU and check up w/ dr Heber Alton in march, she wants to speak to a dr about the dementia diag

## 2016-02-01 NOTE — Telephone Encounter (Signed)
Needs to speak with a nurse regarding pt.

## 2016-02-02 NOTE — Telephone Encounter (Signed)
Dr. Heber  will return from vacation on Monday and can address this issue at that time.

## 2016-02-07 LAB — BASIC METABOLIC PANEL
BUN: 18 mg/dL (ref 4–21)
Creatinine: 1.1 mg/dL (ref 0.6–1.3)
Glucose: 95 mg/dL
Potassium: 4.7 mmol/L (ref 3.4–5.3)
Sodium: 144 mmol/L (ref 137–147)

## 2016-02-07 LAB — CBC AND DIFFERENTIAL
HCT: 33 % — AB (ref 41–53)
Hemoglobin: 10.6 g/dL — AB (ref 13.5–17.5)
Neutrophils Absolute: 5 /uL
Platelets: 179 10*3/uL (ref 150–399)
WBC: 8 10^3/mL

## 2016-02-09 ENCOUNTER — Encounter: Payer: Self-pay | Admitting: Adult Health

## 2016-02-09 ENCOUNTER — Non-Acute Institutional Stay (SKILLED_NURSING_FACILITY): Payer: Medicare Other | Admitting: Adult Health

## 2016-02-09 DIAGNOSIS — I10 Essential (primary) hypertension: Secondary | ICD-10-CM | POA: Diagnosis not present

## 2016-02-09 DIAGNOSIS — D638 Anemia in other chronic diseases classified elsewhere: Secondary | ICD-10-CM

## 2016-02-09 DIAGNOSIS — R531 Weakness: Secondary | ICD-10-CM | POA: Diagnosis not present

## 2016-02-09 DIAGNOSIS — R3914 Feeling of incomplete bladder emptying: Secondary | ICD-10-CM | POA: Diagnosis not present

## 2016-02-09 DIAGNOSIS — N401 Enlarged prostate with lower urinary tract symptoms: Secondary | ICD-10-CM | POA: Diagnosis not present

## 2016-02-09 DIAGNOSIS — E785 Hyperlipidemia, unspecified: Secondary | ICD-10-CM | POA: Diagnosis not present

## 2016-02-09 DIAGNOSIS — N390 Urinary tract infection, site not specified: Secondary | ICD-10-CM | POA: Diagnosis not present

## 2016-02-09 DIAGNOSIS — R2681 Unsteadiness on feet: Secondary | ICD-10-CM

## 2016-02-09 DIAGNOSIS — I82509 Chronic embolism and thrombosis of unspecified deep veins of unspecified lower extremity: Secondary | ICD-10-CM | POA: Diagnosis not present

## 2016-02-09 DIAGNOSIS — B9620 Unspecified Escherichia coli [E. coli] as the cause of diseases classified elsewhere: Secondary | ICD-10-CM

## 2016-02-09 NOTE — Progress Notes (Signed)
DATE:  02/09/2016   MRN:  [id number]  BIRTHDAY: May 22, 1924  Facility:  Nursing Home Location:  [facility] [place] Room Number: 1101-A  LEVEL OF CARE:  SNF (31)  Contact Information    Name Relation Home Work Mobile   [place] Grandaughter   [phone]   [place] Daughter [phone]       Code Status History    Date Active Date Inactive Code Status Order ID Comments User Context   01/26/2016  6:58 PM 01/29/2016 12:46 AM Full Code [id number]  Collier Salina, MD Inpatient   02/26/2015  5:44 PM 03/01/2015  7:02 PM Full Code [id number]  Milagros Loll, MD Inpatient   12/18/2013 11:44 PM 12/22/2013  6:42 PM Full Code [id number]  Jones Bales, MD Inpatient   07/08/2013  5:56 PM 07/10/2013  8:24 PM Full Code [id number]  Otho Bellows, MD Inpatient   06/16/2013 10:47 AM 06/20/2013  7:25 PM Full Code [id number]  Azzie Roup, MD Inpatient   06/10/2013  8:20 PM 06/16/2013 10:47 AM Full Code [id number]  Otho Bellows, MD Inpatient   05/19/2013  4:03 PM 05/21/2013  4:38 PM Full Code [id number]  Marcia Brash, MD Inpatient   06/24/2012  1:17 AM 06/26/2012  9:26 PM Full Code [id number]  Orvan Falconer, MD Inpatient       Chief Complaint  Patient presents with  . Discharge Note    HISTORY OF PRESENT ILLNESS:  This is an 82 year old male who Is for discharge home with home health PT, OT, CNA, Education officer, museum and Nursing.   She has been admitted to [facility] on 01/28/16 from [facility] hospitalization 01/26/16 to 01/28/16.  He has PMH of history of CVA, recurrent UTIs due to bladder outlet obstruction caused by BPH and S/P TURP in 2015. He was treated in the hospital for UTI with 1 dose of ceftriaxone then transition to cephalexin.  Patient was admitted to this facility for short-term rehabilitation after the patient's recent hospitalization.  Patient has completed SNF rehabilitation and therapy has cleared the  patient for discharge.  PAST MEDICAL HISTORY:  Past Medical History:  Diagnosis Date  . Anemia   . BPH (benign prostatic hypertrophy)    TURP 05/19/13  . Chronic kidney disease    CHRONIC KIDNEY DISEASE, 2  . Difficulty hearing, right    BILATERAL HEARING LOSS - BEST TO TRY TO SPEAK INTO LEFT EAR  . DVT (deep venous thrombosis) ([place])   . Frequent falls   . Hyperlipidemia   . Hypertension   . Incontinence of urine    SOME INCONTINENCE  . Meningioma ([place])   . Stroke ([place])    Cerebellar, 2013; WALKS WITH WALKER, ABLE TO DRESS AND BATHE HIMSELF BUT FAMILY TRIES TO PROVIDE SUPERVISION BECAUSE OF HIS HX OF FALL AND WEAKNESS LEGS, ARMS   . Thrombocytopenia ([facility])      CURRENT MEDICATIONS: Reviewed  Patient's Medications  New Prescriptions   No medications on file  Previous Medications   AMLODIPINE (NORVASC) 5 MG TABLET    Take 1 tablet (5 mg total) by mouth daily.   ATORVASTATIN (LIPITOR) 20 MG TABLET    Take 1 tablet (20 mg total) by mouth daily at 6 PM.   LOSARTAN (COZAAR) 100 MG TABLET    Take 100 mg by mouth daily.   METOPROLOL TARTRATE (LOPRESSOR) 25 MG TABLET    Take 1 tablet (25 mg total) by mouth 2 (two) times  daily.   RIVAROXABAN (XARELTO) 15 MG TABS TABLET    take 1 tablet by mouth once daily WITH SUPPER  Modified Medications   No medications on file  Discontinued Medications   No medications on file     No Known Allergies   REVIEW OF SYSTEMS:  GENERAL: no change in appetite, no fatigue, no weight changes, no fever, chills or weakness EYES: Denies change in vision, dry eyes, eye pain, itching or discharge EARS: Denies change in hearing, ringing in ears, or earache NOSE: Denies nasal congestion or epistaxis MOUTH and THROAT: Denies oral discomfort, gingival pain or bleeding, pain from teeth or hoarseness   RESPIRATORY: no cough, SOB, DOE, wheezing, hemoptysis CARDIAC: no chest pain, edema or palpitations GI: no abdominal pain, diarrhea, constipation, heart burn,  nausea or vomiting GU: Denies dysuria, frequency, hematuria, incontinence, or discharge PSYCHIATRIC: Denies feeling of depression or anxiety. No report of hallucinations, insomnia, paranoia, or agitation    PHYSICAL EXAMINATION  GENERAL APPEARANCE: Well nourished. In no acute distress. Normal body habitus SKIN:  Skin is warm and dry.  HEAD: Normal in size and contour. No evidence of trauma EYES: Lids open and close normally. No blepharitis, entropion or ectropion. PERRL. Conjunctivae are clear and sclerae are white. Lenses are without opacity EARS: Pinnae are normal. Has healing aide on left ear MOUTH and THROAT: Lips are without lesions. Oral mucosa is moist and without lesions. Tongue is normal in shape, size, and color and without lesions NECK: supple, trachea midline, no neck masses, no thyroid tenderness, no thyromegaly LYMPHATICS: no LAN in the neck, no supraclavicular LAN RESPIRATORY: breathing is even & unlabored, BS CTAB CARDIAC: RRR, no murmur,no extra heart sounds, no edema GI: abdomen soft, normal BS, no masses, no tenderness, no hepatomegaly, no splenomegaly PSYCHIATRIC: Alert to person and place, disoriented to time. Affect and behavior are appropriate  LABS/RADIOLOGY: Labs reviewed: Basic Metabolic Panel:  Recent Labs  02/26/15 1912  03/01/15 0607 01/26/16 2030 01/27/16 0311 02/07/16  NA 148*  < > 141 139 139 144  K 3.9  < > 4.3 4.2 3.7 4.7  CL 114*  < > 108 105 106  --   CO2 26  < > 27 26 25   --   GLUCOSE 119*  < > 103* 109* 108*  --   BUN 18  < > 15 11 13 18   CREATININE 1.26*  < > 1.05 1.15 1.23 1.1  CALCIUM 10.3  < > 9.7 10.2 10.1  --   MG 1.9  --  1.8  --   --   --   PHOS 2.6  --   --   --   --   --   < > = values in this interval not displayed. Liver Function Tests:  Recent Labs  02/26/15 1912 01/26/16 2352  AST 28 18  ALT 19 11*  ALKPHOS 51 53  BILITOT 1.1 1.0  PROT 6.9 6.6  ALBUMIN 3.0* 3.1*   CBC:  Recent Labs  02/26/15 1912   03/01/15 0607 01/26/16 2030 01/27/16 0311 02/07/16  WBC 9.4  < > 8.2 11.2* 9.1 8.0  NEUTROABS 7.1  --   --   --  6.7 5  HGB 11.9*  < > 10.7* 12.1* 11.6* 10.6*  HCT 37.4*  < > 34.0* 37.6* 35.2* 33*  MCV 97.9  < > 93.7 90.6 90.0  --   PLT 169  < > 184 132* 122* 179  < > = values in this interval not  displayed.  CBG:  Recent Labs  02/27/15 0756  GLUCAP 107*      Dg Chest 2 View  Result Date: 01/26/2016 CLINICAL DATA:  Cough EXAM: CHEST  2 VIEW COMPARISON:  07/09/2014 FINDINGS: There is stable cardiomegaly with tortuous appearing thoracic aorta. Mild pulmonary vascular congestion. No pneumonic consolidation, effusion or pneumothorax. IMPRESSION: Stable cardiomegaly with mild vascular congestion. Electronically Signed   By: Ashley Royalty M.D.   On: 01/26/2016 22:25   Ct Head Wo Contrast  Result Date: 01/26/2016 CLINICAL DATA:  Ataxia, 3 day history of leaning to the right side that became more pronounced this morning. EXAM: CT HEAD WITHOUT CONTRAST TECHNIQUE: Contiguous axial images were obtained from the base of the skull through the vertex without intravenous contrast. COMPARISON:  02/27/2015 CT head, 12/19/2013 MRI of the head FINDINGS: Brain: There is moderate ventricular and mild sulcal prominence as before consistent with superficial and central atrophy. Extensive periventricular and subcortical white matter hypodensity consistent with chronic small vessel ischemic disease. Remote lacunar infarct is noted at the right thalamus. Confluent area of encephalomalacia involving the right cerebellar hemisphere consistent with remote cerebellar infarct. No evidence for new acute large vascular territory infarction. No acute intracranial hemorrhage. No adjacent edema. No new mass lesions can extra-axial fluid collections. Vascular: No hyperdense vessels. Skull: No fracture or bone destruction. Large calcified left sphenoid wing meningioma estimated at 2.5 x 2.8 cm, without significant change.  Sinuses/Orbits: The mastoids are clear as are the visualized paranasal sinuses. The globes are symmetric in appearance. Other: The scalp and soft tissues are unremarkable IMPRESSION: Chronic right cerebellar infarct with encephalomalacia. Chronic moderate to marked periventricular and subcortical white matter small vessel ischemic disease. Superficial and central atrophy. Stable left sphenoid wing meningioma. No acute CT abnormality identified. Electronically Signed   By: Ashley Royalty M.D.   On: 01/26/2016 22:32    ASSESSMENT/PLAN:  Generalized weakness - has history of cerebellar stroke 2 years ago, head CT showed no new abnormalities; will have Home health SW, Nursing, CNA, PT and OT, for therapeutic strengthening exercises; fall precaution  Gait instability - will have Home health SW, Nursing, CNA, PT and OT, for therapeutic strengthening exercises; observe fall precautions  UTI - recently finished course of antibiotics  BPH with urinary retention - S/P TURP in 2015;follow-up with urology  Hypertension - well-controlled;  continue metoprolol 25 mg twice a day, amlodipine 5 mg daily and losartan 100 mg 1 tab by mouth daily  Chronic lower extremity DVT - with IVC  filter; continue Xarelto 15 mg daily  Hyperlipidemia - continue atorvastatin 20 mg 1 tab by mouth every 6 p.m.  Anemia of chronic disease -  stable Lab Results  Component Value Date   HGB 10.6 (A) 02/07/2016   Dementia - continue supportive care; observe fall precautions   I have filled out patient's discharge paperwork and written prescriptions.  Patient will receive home health PT, OT, SW, Nursing and CNA.  DME provided: None  Total discharge time: Greater than 30 minutes Greater than 50% was spent in counseling and coordination of care with the patient.   Discharge time involved coordination of the discharge process with social worker, nursing staff and therapy department. Medical justification for home health services  verified.     Elroy [employer] [phone]

## 2016-02-10 NOTE — Telephone Encounter (Signed)
I called back [name], she is concerned that she was told in the hospital that her father has dementia, she is not sure about this as he is very hard of hearing and he did not pass some of the tests before about his memory but she thinks it is because he could not hear well.  I told her that there is nothing urgent about this, he may have some dementia or may not.  We will try to tease this out further in future visits.

## 2016-02-14 ENCOUNTER — Telehealth: Payer: Self-pay

## 2016-02-14 ENCOUNTER — Ambulatory Visit: Payer: Medicare Other

## 2016-02-14 DIAGNOSIS — M6281 Muscle weakness (generalized): Secondary | ICD-10-CM | POA: Diagnosis not present

## 2016-02-14 NOTE — Telephone Encounter (Signed)
Denyse Amass from [facility] needs to speak with a nurse regarding meds.

## 2016-02-15 ENCOUNTER — Encounter: Payer: Self-pay | Admitting: Internal Medicine

## 2016-02-15 ENCOUNTER — Ambulatory Visit (INDEPENDENT_AMBULATORY_CARE_PROVIDER_SITE_OTHER): Payer: Medicare Other | Admitting: Internal Medicine

## 2016-02-15 VITALS — BP 136/69 | HR 63 | Temp 97.7°F | Ht 67.0 in | Wt 185.6 lb

## 2016-02-15 DIAGNOSIS — R339 Retention of urine, unspecified: Secondary | ICD-10-CM

## 2016-02-15 DIAGNOSIS — Z09 Encounter for follow-up examination after completed treatment for conditions other than malignant neoplasm: Secondary | ICD-10-CM

## 2016-02-15 DIAGNOSIS — N3001 Acute cystitis with hematuria: Secondary | ICD-10-CM

## 2016-02-15 DIAGNOSIS — Z87440 Personal history of urinary (tract) infections: Secondary | ICD-10-CM | POA: Diagnosis not present

## 2016-02-15 MED ORDER — RIVAROXABAN 15 MG PO TABS: ORAL_TABLET | ORAL | 3 refills | Status: DC

## 2016-02-15 MED ORDER — IRBESARTAN 300 MG PO TABS: 300.0000 mg | ORAL_TABLET | Freq: Every day | ORAL | 2 refills | Status: DC

## 2016-02-15 NOTE — Telephone Encounter (Signed)
[facility] realized pt had finished abx course, no questions

## 2016-02-15 NOTE — Progress Notes (Signed)
   CC: Hospital follow up, UTI and urinary retention  HPI:  [name] is a 82 y.o. male with PMHx detailed below presenting for follow up of hospitalization for UTI secondary to urinary retention. He returned home from [facility] two days ago and has been doing well with no symptom recurrence.  See problem based assessment and plan below for additional details.  Past Medical History:  Diagnosis Date  . Anemia   . BPH (benign prostatic hypertrophy)    TURP 05/19/13  . Chronic kidney disease    CHRONIC KIDNEY DISEASE, 2  . Difficulty hearing, right    BILATERAL HEARING LOSS - BEST TO TRY TO SPEAK INTO LEFT EAR  . DVT (deep venous thrombosis) ([place])   . Frequent falls   . Hyperlipidemia   . Hypertension   . Incontinence of urine    SOME INCONTINENCE  . Meningioma ([place])   . Stroke ([place])    Cerebellar, 2013; WALKS WITH WALKER, ABLE TO DRESS AND BATHE HIMSELF BUT FAMILY TRIES TO PROVIDE SUPERVISION BECAUSE OF HIS HX OF FALL AND WEAKNESS LEGS, ARMS   . Thrombocytopenia ([place])     Review of Systems: Review of Systems  Constitutional: Negative for chills, fever, malaise/fatigue and weight loss.  HENT: Positive for hearing loss.   Respiratory: Negative for cough and shortness of breath.   Cardiovascular: Negative for chest pain and palpitations.  Gastrointestinal: Negative for abdominal pain, nausea and vomiting.  Genitourinary: Negative for dysuria and flank pain.  All other systems reviewed and are negative.   Physical Exam: Vitals:   02/15/16 1512  BP: 136/69  Pulse: 63  Temp: 97.7 F (36.5 C)  TempSrc: Oral  Weight: 185 lb 9.6 oz (84.2 kg)   Body mass index is 29.96 kg/m. GENERAL- Elderly gentleman sitting comfortably in wheelchair, alert, in no distress, very hard of hearing HEENT- Atraumatic, EOMI, moist mucous membranes, dentures in place, hearing aid over left ear making high-pitched noise CARDIAC- Regular rate and rhythm, no murmurs, rubs or  gallops. RESP- Clear to ascultation bilaterally, no wheezing or crackles, normal work of breathing ABDOMEN- Soft, nontender, nondistended BACK- Normal curvature, no paraspinal tenderness, no CVA tenderness. EXTREMITIES- Muscle atrophy over forearms/hands bilaterally, otherwise normal bulk and range of motion, trace lower leg edema, 1+ peripheral pulses SKIN- Warm, dry, intact, without visible rash PSYCH- Positive affect  Assessment & Plan:   See encounters tab for problem based medical decision making.  Patient seen with Dr. Evette Doffing

## 2016-02-15 NOTE — Patient Instructions (Addendum)
Please continue to take your medications as prescribed.   Watch for symptoms of UTI such as fever, excess sleepiness, confusion, dry diapers.   We will try and call Dr. Arlyn Leak office to expedite the urology visit process.  Thank you visiting today!

## 2016-02-15 NOTE — Assessment & Plan Note (Signed)
[facility] / Dr. Arlyn Leak office and they report that [name] has an appointment tomorrow at [place] his daughter to relay this.

## 2016-02-15 NOTE — Assessment & Plan Note (Addendum)
Recent brief hospitalization 11/15-11/17 for E. Coli UTI, presented with confusion and gait instability, received IV Rocephin then transitioned to oral Cephalexin and finished 7 day course on 11/22. Has been recovering at [facility] place SNF until 12/2 and has been living at home under the care of his daughter. He is at his mental and physical baseline and has had no new issues. Home nursing care visited yesterday and he will be receiving home PT, OT, nursing care. He continues to wear adult diapers, changed 3 times daily. He has been seen by [facility]/Dr. Gaynelle Arabian in the past but his daughter reports that they could not get an appointment until some medical payments were taken care of. Benign exam with no abdominal pain, unable to evaluate level of urinary retention.  Plan: - Follow up with [facility] (called their clinic today and they reported that Mr Himmelman has an appointment tomorrow at [place], called his daughter's cell phone and left a message about this) - Continue to monitor for UTI symptoms

## 2016-02-16 DIAGNOSIS — M6281 Muscle weakness (generalized): Secondary | ICD-10-CM | POA: Diagnosis not present

## 2016-02-16 NOTE — Progress Notes (Signed)
Internal Medicine Clinic Attending  I saw and evaluated the patient.  I personally confirmed the key portions of the history and exam documented by Dr. Johnson and I reviewed pertinent patient test results.  The assessment, diagnosis, and plan were formulated together and I agree with the documentation in the resident's note.  

## 2016-02-17 DIAGNOSIS — R3914 Feeling of incomplete bladder emptying: Secondary | ICD-10-CM | POA: Diagnosis not present

## 2016-02-17 DIAGNOSIS — N401 Enlarged prostate with lower urinary tract symptoms: Secondary | ICD-10-CM | POA: Diagnosis not present

## 2016-02-17 DIAGNOSIS — M6281 Muscle weakness (generalized): Secondary | ICD-10-CM | POA: Diagnosis not present

## 2016-02-22 DIAGNOSIS — M6281 Muscle weakness (generalized): Secondary | ICD-10-CM | POA: Diagnosis not present

## 2016-02-23 DIAGNOSIS — M6281 Muscle weakness (generalized): Secondary | ICD-10-CM | POA: Diagnosis not present

## 2016-02-24 DIAGNOSIS — M6281 Muscle weakness (generalized): Secondary | ICD-10-CM | POA: Diagnosis not present

## 2016-02-28 DIAGNOSIS — M6281 Muscle weakness (generalized): Secondary | ICD-10-CM | POA: Diagnosis not present

## 2016-03-01 DIAGNOSIS — M6281 Muscle weakness (generalized): Secondary | ICD-10-CM | POA: Diagnosis not present

## 2016-03-02 DIAGNOSIS — M6281 Muscle weakness (generalized): Secondary | ICD-10-CM | POA: Diagnosis not present

## 2016-03-07 DIAGNOSIS — M6281 Muscle weakness (generalized): Secondary | ICD-10-CM | POA: Diagnosis not present

## 2016-03-09 DIAGNOSIS — M6281 Muscle weakness (generalized): Secondary | ICD-10-CM | POA: Diagnosis not present

## 2016-03-14 DIAGNOSIS — M6281 Muscle weakness (generalized): Secondary | ICD-10-CM | POA: Diagnosis not present

## 2016-03-16 DIAGNOSIS — M6281 Muscle weakness (generalized): Secondary | ICD-10-CM | POA: Diagnosis not present

## 2016-04-27 ENCOUNTER — Ambulatory Visit (INDEPENDENT_AMBULATORY_CARE_PROVIDER_SITE_OTHER): Payer: Medicare Other | Admitting: Internal Medicine

## 2016-04-27 ENCOUNTER — Encounter: Payer: Self-pay | Admitting: Internal Medicine

## 2016-04-27 VITALS — BP 150/79 | HR 55 | Temp 98.0°F | Ht 69.0 in | Wt 186.9 lb

## 2016-04-27 DIAGNOSIS — Z8673 Personal history of transient ischemic attack (TIA), and cerebral infarction without residual deficits: Secondary | ICD-10-CM | POA: Diagnosis not present

## 2016-04-27 DIAGNOSIS — Z23 Encounter for immunization: Secondary | ICD-10-CM

## 2016-04-27 DIAGNOSIS — I10 Essential (primary) hypertension: Secondary | ICD-10-CM | POA: Diagnosis not present

## 2016-04-27 DIAGNOSIS — R32 Unspecified urinary incontinence: Secondary | ICD-10-CM | POA: Diagnosis not present

## 2016-04-27 DIAGNOSIS — F0390 Unspecified dementia without behavioral disturbance: Secondary | ICD-10-CM | POA: Diagnosis not present

## 2016-04-27 DIAGNOSIS — Z86718 Personal history of other venous thrombosis and embolism: Secondary | ICD-10-CM

## 2016-04-27 DIAGNOSIS — Z79899 Other long term (current) drug therapy: Secondary | ICD-10-CM

## 2016-04-27 DIAGNOSIS — N401 Enlarged prostate with lower urinary tract symptoms: Secondary | ICD-10-CM | POA: Diagnosis not present

## 2016-04-27 DIAGNOSIS — D329 Benign neoplasm of meninges, unspecified: Secondary | ICD-10-CM

## 2016-04-27 DIAGNOSIS — Z993 Dependence on wheelchair: Secondary | ICD-10-CM | POA: Diagnosis not present

## 2016-04-27 DIAGNOSIS — R5381 Other malaise: Secondary | ICD-10-CM

## 2016-04-27 DIAGNOSIS — E210 Primary hyperparathyroidism: Secondary | ICD-10-CM | POA: Diagnosis not present

## 2016-04-27 DIAGNOSIS — Z7901 Long term (current) use of anticoagulants: Secondary | ICD-10-CM

## 2016-04-27 MED ORDER — AMLODIPINE BESYLATE 5 MG PO TABS: 5.0000 mg | ORAL_TABLET | Freq: Every day | ORAL | 3 refills | Status: DC

## 2016-04-27 MED ORDER — ATORVASTATIN CALCIUM 20 MG PO TABS: 20.0000 mg | ORAL_TABLET | Freq: Every day | ORAL | 3 refills | Status: DC

## 2016-04-27 MED ORDER — METOPROLOL TARTRATE 25 MG PO TABS: 25.0000 mg | ORAL_TABLET | Freq: Two times a day (BID) | ORAL | 3 refills | Status: DC

## 2016-04-27 NOTE — Patient Instructions (Signed)
I want you to give [facility] a call to find out about that program.    Keep up the good work.  I will try to find out if you are eligible for more physical therapy.  I will try to get the records from Urology.

## 2016-04-27 NOTE — Assessment & Plan Note (Signed)
[name] reports that after her last visit here she did see Dr. Gaynelle Arabian at [facility] urology. They did some test but she never heard any results. She asked me if I knew what the results were I informed her that I did not have any records from that visit. She thinks he is overall doing well however anytime he is a little unsteady on his feet she's worried that a urinary tract infection may be coming back. He does occasionally urinate on himself.  Assessment BPH  Plan Will try to obtain records from Dr. Arlyn Leak office. I'll clear does appear that he does have a visit with [facility] urology next Monday hopefully records will clarify why this is.

## 2016-04-27 NOTE — Progress Notes (Addendum)
[facility] Subjective:  HPI: [name] is a 83 y.o. male who presents for follow up of UTI as well as HTN.  Please see Problem based charting below for the status of his chronic medical problems.  He is accompanied by his daughter [name] who is his primary caregiver she also provides all of the history due to his dementia.    Review of Systems: Per HPI/Daughter Objective:  Physical Exam: Vitals:   04/27/16 1048 04/27/16 1141  BP: (!) 162/87 (!) 150/79  Pulse: 64 (!) 55  Temp: 98 F (36.7 C)   TempSrc: Oral   SpO2: 96%   Weight: 186 lb 14.4 oz (84.8 kg)   Height: 5\' 9"  (1.753 m)    Physical Exam  Constitutional: He is well-developed, well-nourished, and in no distress.  Pleasantly demented sitting in wheelchair only response to questions is" doing fine"  HENT:  Head: Normocephalic and atraumatic.  Cardiovascular: Normal rate and regular rhythm.   Pulmonary/Chest: Effort normal and breath sounds normal. He has no wheezes.  Abdominal: Soft. Bowel sounds are normal. There is no tenderness.  Musculoskeletal: He exhibits no edema.  Lower extremity and upper extremity muscle strength grossly intact but unable to stand out of wheelchair without 2 person assist     Assessment & Plan:  HTN (hypertension) History of present illness: [name] reports he is doing very well he is taking metoprolol 25 mg twice a day and amlodipine 5 mg daily, irbesartan 300mg  daily. On this regimen he has not had further orthostatic hypotension.  Assessment essential hypertension  Plan Repeat blood pressure measurement is 150/79 which is pretty much at goal for him I will not change his medications today  Primary hyperparathyroidism History of present illness: Calcium seems to be remaining at the upper limit of normal, he has not had any kidney stones alterations in his psyche or other complaints.  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 He has not had any bleeding and is doing well  Assessment long-term use of anticoagulant therapy  Plan continue his Xarelto 15mg  daily  Physical deconditioning [name] reports he has remained physically deconditioned after his last hospitalization. He was doing better when home health is coming out however they finished up in January. She has a hard time getting him up and to move. He will do some limited exercise while seated. However this but puts a big strain on [place].  Assessment physical deconditioning  Plan I will place a repeat referral for home health physical therapy I do think he would benefit from further therapy but I'm not sure if he will qualify per his insurance. I did spend some time today also talking to [name] about the [facility] program as I think they would be excellent candidates. She will give [facility] a call to discuss this further.  Meningioma ([place]) -Remains asymptomatic.  BPH (benign prostatic hyperplasia) [name] reports that after her last visit here she did see Dr. Gaynelle Arabian at [facility] urology. They did some test but she never heard any results. She asked me if I knew what the results were I informed her that I did not have any records from that visit. She thinks he is overall doing well however anytime he is a little unsteady on his feet she's worried that a urinary tract infection  may be coming back. He does occasionally urinate on himself.  Assessment BPH  Plan Will try to obtain records from Dr. Arlyn Leak office. I'll clear does appear that he does have a visit with [facility] urology next Monday hopefully records will clarify why this is.   Medications Ordered Meds ordered this encounter  Medications  . metoprolol tartrate (LOPRESSOR) 25 MG tablet    Sig: Take 1  tablet (25 mg total) by mouth 2 (two) times daily.    Dispense:  180 tablet    Refill:  3  . amLODipine (NORVASC) 5 MG tablet    Sig: Take 1 tablet (5 mg total) by mouth daily.    Dispense:  90 tablet    Refill:  3  . atorvastatin (LIPITOR) 20 MG tablet    Sig: Take 1 tablet (20 mg total) by mouth daily at 6 PM.    Dispense:  90 tablet    Refill:  3   Other Orders Orders Placed This Encounter  Procedures  . Flu Vaccine QUAD 36+ mos IM  . Pneumococcal conjugate vaccine 13-valent  . Home Health    Order Specific Question:   To provide the following care/treatments    Answer:   PT  . Face-to-face encounter (required for Medicare/Medicaid patients)    I [name] certify that this patient is under my care and that I, or a nurse practitioner or physician's assistant working with me, had a face-to-face encounter that meets the physician face-to-face encounter requirements with this patient on 04/27/2016. The encounter with the patient was in whole, or in part for the following medical condition(s) which is the primary reason for home health care (List medical condition): physical deconditioning, unsteady gait, frequent falls    Order Specific Question:   The encounter with the patient was in whole, or in part, for the following medical condition, which is the primary reason for home health care    Answer:   physical decondtioning, instability of gait, dementia    Order Specific Question:   I certify that, based on my findings, the following services are medically necessary home health services    Answer:   Physical therapy    Order Specific Question:   Reason for Medically Necessary Home Health Services    Answer:   Therapy- Therapeutic Exercises to Increase Strength and Endurance    Order Specific Question:   My clinical findings support the need for the above services    Answer:   Unsafe ambulation due to balance issues    Order Specific Question:   Further, I certify that my clinical  findings support that this patient is homebound due to:    Answer:   Unable to leave home safely without assistance   Follow Up: Return in about 6 months (around 10/25/2016).

## 2016-04-27 NOTE — Assessment & Plan Note (Signed)
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 He has not had any bleeding and is doing well  Assessment long-term use of anticoagulant therapy  Plan continue his Xarelto 15mg  daily

## 2016-04-27 NOTE — Assessment & Plan Note (Signed)
History of present illness: Calcium seems to be remaining at the upper limit of normal, he has not had any kidney stones alterations in his psyche or other complaints.  Assessment primary hyperparathyroidism  Plan continue to monitor

## 2016-04-27 NOTE — Assessment & Plan Note (Signed)
Remains asymptomatic.

## 2016-04-27 NOTE — Assessment & Plan Note (Signed)
History of present illness: [name] reports he is doing very well he is taking metoprolol 25 mg twice a day and amlodipine 5 mg daily, irbesartan 300mg  daily. On this regimen he has not had further orthostatic hypotension.  Assessment essential hypertension  Plan Repeat blood pressure measurement is 150/79 which is pretty much at goal for him I will not change his medications today

## 2016-04-27 NOTE — Assessment & Plan Note (Signed)
[name] reports he has remained physically deconditioned after his last hospitalization. He was doing better when home health is coming out however they finished up in January. She has a hard time getting him up and to move. He will do some limited exercise while seated. However this but puts a big strain on [place].  Assessment physical deconditioning  Plan I will place a repeat referral for home health physical therapy I do think he would benefit from further therapy but I'm not sure if he will qualify per his insurance. I did spend some time today also talking to [place] about the [facility] program as I think they would be excellent candidates. She will give [facility] a call to discuss this further.

## 2016-05-11 ENCOUNTER — Encounter: Payer: Medicare Other | Admitting: Internal Medicine

## 2016-06-12 ENCOUNTER — Telehealth: Payer: Self-pay

## 2016-06-12 NOTE — Telephone Encounter (Signed)
Need to speak with a nurse about Rivaroxaban (XARELTO) 15 MG TABS tablet. Please call back.

## 2016-06-13 NOTE — Telephone Encounter (Signed)
Pt daughter need to speak with a nurse about med. Please call back.

## 2016-06-13 NOTE — Telephone Encounter (Signed)
Pt cannot afford medication, called pharm, copay is over 300.00, will send to jayg and pcp

## 2016-06-14 NOTE — Telephone Encounter (Signed)
Pt's daughter states she has call several times in  Reference to her father's med Rivaroxaban (XARELTO) 15 MG TABS tablet   he has been out of.  She would like a call back as soon as possible. She is very worried about her father being without this medication for a couple of days.

## 2016-06-14 NOTE — Telephone Encounter (Signed)
Pt's daughter called again and apologized her phone is low on batteries but still try her @ [phone] and you can call [phone] which is her daughter's Hiram Comber) to reach her.  You can call either number.

## 2016-06-14 NOTE — Telephone Encounter (Signed)
I have called several times also and get vmail, will page Ulice Dash and see if he can get them some of the xarelto

## 2016-06-16 ENCOUNTER — Encounter: Payer: Self-pay | Admitting: Internal Medicine

## 2016-06-16 ENCOUNTER — Ambulatory Visit (INDEPENDENT_AMBULATORY_CARE_PROVIDER_SITE_OTHER): Payer: Medicare Other | Admitting: Internal Medicine

## 2016-06-16 DIAGNOSIS — Z95828 Presence of other vascular implants and grafts: Secondary | ICD-10-CM | POA: Diagnosis not present

## 2016-06-16 DIAGNOSIS — I82501 Chronic embolism and thrombosis of unspecified deep veins of right lower extremity: Secondary | ICD-10-CM | POA: Diagnosis not present

## 2016-06-16 DIAGNOSIS — I82509 Chronic embolism and thrombosis of unspecified deep veins of unspecified lower extremity: Secondary | ICD-10-CM

## 2016-06-16 DIAGNOSIS — Z7189 Other specified counseling: Secondary | ICD-10-CM | POA: Diagnosis not present

## 2016-06-16 NOTE — Progress Notes (Signed)
   CC: Medication difficulty  HPI:  [name] is a 83 y.o. man with past medical history as noted below who presents today for a medication difficulty.  Patient is accompanied by his grandson who states the patient is having trouble affording his Xarelto. [name] states he and his mother were told the Xarelto would be over $300 due to them needing to meet a deductible. Patient has been on Xarelto for a history of a provoked DVT in May 2015 after TURP surgery. He was initially on Lovenox and Coumadin, but then this was stopped due to hematuria and an IVC filter was placed on 06/16/2013. Anticoagulation was then resumed once the hematuria stopped. He then had a bleeding complication and anticoagulation was interrupted. Coumadin was restarted on 08/18/2013. Patient then had an acute right leg DVT diagnosed on 09/15/2013 and he was transitioned to Xarelto. Patient has been off the Xarelto for 3 days now. He denies any leg pain, swelling, or erythema.  I discussed this matter with the patient's daughter, [name], on the phone after the patient had left the office. We discussed that anticoagulation can be discontinued given the patient has an IVC filter and his initial DVT was provoked by surgery. He likely has a greater risk of bleeding than benefit from Xarelto. [name] is in agreement with discontinuation of Xarelto.  Past Medical History:  Diagnosis Date  . Anemia   . BPH (benign prostatic hypertrophy)    TURP 05/19/13  . Chronic kidney disease    CHRONIC KIDNEY DISEASE, 2  . Difficulty hearing, right    BILATERAL HEARING LOSS - BEST TO TRY TO SPEAK INTO LEFT EAR  . DVT (deep venous thrombosis) ([place])   . Frequent falls   . Hyperlipidemia   . Hypertension   . Incontinence of urine    SOME INCONTINENCE  . Meningioma ([place])   . Stroke ([place])    Cerebellar, 2013; WALKS WITH WALKER, ABLE TO DRESS AND BATHE HIMSELF BUT FAMILY TRIES TO PROVIDE SUPERVISION BECAUSE OF HIS HX OF FALL AND  WEAKNESS LEGS, ARMS   . Thrombocytopenia ([place])     Review of Systems:   General: Denies fever, chills, night sweats, changes in weight, changes in appetite HEENT: Denies headaches, ear pain, changes in vision, rhinorrhea, sore throat CV: Denies CP, palpitations, SOB, orthopnea Pulm: Denies SOB, cough, wheezing GI: Denies abdominal pain, nausea, vomiting, diarrhea, constipation, melena, hematochezia GU: Denies dysuria, hematuria, frequency Msk: Denies muscle cramps, joint pains Neuro: Denies weakness, numbness, tingling Skin: Denies rashes, bruising Psych: Denies depression, anxiety, hallucinations  Physical Exam:  Vitals:   06/16/16 1514  BP: (!) 154/88  Pulse: (!) 59  Temp: 98.4 F (36.9 C)  TempSrc: Oral  SpO2: 100%  Weight: 187 lb 14.4 oz (85.2 kg)  Height: 5\' 9"  (1.753 m)   General: Elderly man with decreased hearing, NAD, pleasant  CV: RRR, no m/g/r Pulm: CTA bilaterally, breaths non-labored Ext: warm, no peripheral edema. No evidence of DVT.  Assessment & Plan:   See Encounters Tab for problem based charting.  Patient discussed with Dr. Angelia Mould

## 2016-06-16 NOTE — Assessment & Plan Note (Signed)
Patient having difficulty affording Xarelto due to needing to meet a deductible. He likely does not need to be on Xarelto anymore given his initial DVT was provoked by TURP surgery and for his second DVT in 2015 he had an IVC filter placed. He has had issues with bleeding complications due to anticoagulation in the past. Anticoagulation likely provides more risk than benefit in his case. Patient was given a 7 day supply of Xarelto 10 mg daily before he left the office and before I was able to speak to his daughter [name]. [name] is in agreement to stop Xarelto completely and will notify the patient that he can hold off on taking Xarelto he was given today. I discussed this matter with Dr. Heber [place], who is the patient's PCP, and he is in agreement with stopping Xarelto. A message was sent to our clinic pharmacist, Dr. Maudie Mercury, to look into the patient's deductible and insurance situation to ensure he will not have difficulty affording his other medications.

## 2016-06-16 NOTE — Telephone Encounter (Signed)
Per patient's daughter- patient has been out of Xarelto since Sunday. Unable to afford $300+ charged by pharmacy. She called the insurance company & was told that the patient may need to be seen to meet deductible or lower the cost of this medicine. Holding appt w/ acc @ 3:15 today (dtr will call back to confirm).

## 2016-06-16 NOTE — Patient Instructions (Addendum)
General Instructions: - Take Xarelto 10 mg daily - 7 day supply given - Will discuss with patient's daughter that this medication is not necessary any more. He had a clot after surgery which can happen, this was a provoked clot.  - We will also discuss with our clinic pharmacist in regards to affordability of medications next week  Please bring your medicines with you each time you come to clinic.  Medicines may include prescription medications, over-the-counter medications, herbal remedies, eye drops, vitamins, or other pills.   Progress Toward Treatment Goals:  Treatment Goal 02/11/2015  Blood pressure deteriorated  Prevent falls -    Self Care Goals & Plans:  Self Care Goal 02/11/2015  Manage my medications take my medicines as prescribed; bring my medications to every visit; refill my medications on time  Monitor my health keep track of my blood pressure  Eat healthy foods drink diet soda or water instead of juice or soda; eat more vegetables; eat foods that are low in salt; eat baked foods instead of fried foods; eat fruit for snacks and desserts  Be physically active find an activity I enjoy  Meeting treatment goals maintain the current self-care plan    No flowsheet data found.   Care Management & Community Referrals:  Referral 02/11/2015  Referrals made for care management support none needed  Referrals made to community resources -

## 2016-06-16 NOTE — Telephone Encounter (Signed)
Had a long discussion with patient's daughter, [name]. She is in agreement with stopping Xarelto completely.  Patient's initial DVT was provoked by TURP surgery in May 2015. He had a second DVT in July 2015 and had an IVC filter placed. I think there is more harm with him continued on Xarelto than benefit. Will forward a message to Dr. Maudie Mercury in regards to patient's high deductible issue to see if patient will have difficulty affording other medications and how we can help this issue.

## 2016-06-19 ENCOUNTER — Encounter: Payer: Self-pay | Admitting: Pharmacist

## 2016-06-19 NOTE — Progress Notes (Signed)
Patient is navigating through his deductible.  Medication Samples have been provided to the patient.  Drug: rivaroxaban (Xarelto) Strength: 15 mg Qty: 28 LOT: [id number] Exp.Date: 4/19 Dosing instructions: 1 tablet daily with supper  The patient has been instructed regarding the correct time, dose, and frequency of taking this medication, including desired effects and most common side effects.   If unable to afford, advised patient will need to look into transitioning to warfarin (spoke to patient's daughter, [name] and she verbalized understanding by repeating back information discussed).  Flossie Dibble 5:09 PM 06/19/2016

## 2016-06-20 NOTE — Progress Notes (Signed)
Internal Medicine Clinic Attending  Case discussed with Dr. Arcelia Jew at the time of the visit.  We reviewed the resident's history and exam and pertinent patient test results.  I agree with the assessment, diagnosis, and plan of care documented in the resident's note. [name] did have an extensive DVT that was provoked after his TURP procedure, he did respond well to Xarelto however we had been continuing the A/C per family wishes with ongoing discussions,  Given that they are now having trouble affording the medication I do think that it is OK to discontinue it.

## 2016-08-02 ENCOUNTER — Ambulatory Visit: Payer: Medicare Other

## 2016-08-03 ENCOUNTER — Encounter: Payer: Self-pay | Admitting: Internal Medicine

## 2016-08-03 ENCOUNTER — Ambulatory Visit (INDEPENDENT_AMBULATORY_CARE_PROVIDER_SITE_OTHER): Payer: Medicare Other | Admitting: Internal Medicine

## 2016-08-03 ENCOUNTER — Ambulatory Visit (HOSPITAL_COMMUNITY)
Admission: RE | Admit: 2016-08-03 | Discharge: 2016-08-03 | Disposition: A | Discharge status: Home / Self Care | Payer: Medicare Other | Source: Ambulatory Visit | Attending: Internal Medicine | Admitting: Internal Medicine

## 2016-08-03 DIAGNOSIS — Z7901 Long term (current) use of anticoagulants: Secondary | ICD-10-CM | POA: Diagnosis not present

## 2016-08-03 DIAGNOSIS — M25472 Effusion, left ankle: Secondary | ICD-10-CM | POA: Insufficient documentation

## 2016-08-03 DIAGNOSIS — Z86718 Personal history of other venous thrombosis and embolism: Secondary | ICD-10-CM | POA: Diagnosis not present

## 2016-08-03 DIAGNOSIS — M7989 Other specified soft tissue disorders: Secondary | ICD-10-CM

## 2016-08-03 NOTE — Assessment & Plan Note (Addendum)
Patient is accompanied by his grandson today who helped provide history. Per grandson, family noticed that his left ankle became swollen about two weeks ago. The swelling has persisted. The patient denies pain. He denies any injury or inciting event to the swelling. On exam today, his strength in intact and he is able to tolerate active and passive ROM without pain. The swelling is circumferential and appears to be limited to the soft tissue (see picture under progress note or media tab). He has some pigment loss and a mild rash over his bilateral lower extremities, possibly venous stasis dermatitis. He complained that the rash was itchy and there are some excoriations where he has been scratching. Etiology for this unilateral ankle swelling is unclear. The fact that the ankle joint does not seem to be involved and he is pain free is reassuring. He does have a history of DVT in his left leg but he is anticoagulated on xarelto. My suspicion for recurrent DVT is low. It is possible that he has developed unilateral venous insufficiency given the swelling has occured in the same leg of his prior DVT. However, it is unclear why this has come on so acutely. We will obtain plain films today to rule out a fracture despite him not having pain or reporting injury. If xrays are negative, we will simply monitor for now. If swelling persists patient may benefit from compression stockings.  -- F/u Left Ankle X-ray -- Monitor for now   ADDENDUM: Left ankle xrays with severe circumferential soft tissue swelling, greatest at the medial malleolus. No evidence of acute fracture or dislocation. Called patient's daughter [name] and discussed results. Instructed her to have him keep his leg elevated while resting and to monitor for now. Return to clinic if he develops pain or swelling worsens or persists.

## 2016-08-03 NOTE — Progress Notes (Signed)
   CC: Left ankle swelling   HPI:  [name] is a 83 y.o. male with past medical history outlined below here with complaint of left ankle swelling. For the details of today's visit, please refer to the assessment and plan.  Past Medical History:  Diagnosis Date  . Anemia   . BPH (benign prostatic hypertrophy)    TURP 05/19/13  . Chronic kidney disease    CHRONIC KIDNEY DISEASE, 2  . Difficulty hearing, right    BILATERAL HEARING LOSS - BEST TO TRY TO SPEAK INTO LEFT EAR  . DVT (deep venous thrombosis) ([place])   . Frequent falls   . Hyperlipidemia   . Hypertension   . Incontinence of urine    SOME INCONTINENCE  . Meningioma ([place])   . Stroke ([place])    Cerebellar, 2013; WALKS WITH WALKER, ABLE TO DRESS AND BATHE HIMSELF BUT FAMILY TRIES TO PROVIDE SUPERVISION BECAUSE OF HIS HX OF FALL AND WEAKNESS LEGS, ARMS   . Thrombocytopenia ([place])     Review of Systems:  All pertinents listed in HPI, otherwise negative  Physical Exam:  Vitals:   08/03/16 1522  BP: (!) 170/80  Pulse: 63  Temp: 97.9 F (36.6 C)  TempSrc: Oral  SpO2: 98%  Weight: 191 lb (86.6 kg)    Constitutional: NAD, appears comfortable  Cardiovascular: RRR, no murmurs, rubs, or gallops.  Pulmonary/Chest: CTAB, no wheezes, rales, or rhonchi. No chest wall abnormalities.  Abdominal: Soft, non tender, non distended. +BS.  Extremities: Warm and well perfused. Distal pulses intact. Trace 1+ pitting edema to the mid shins bilaterally. Left ankle with diffuse circumferential soft tissue swelling. ROM and strength intact. No pain with passive or active ROM.  Neurological: A&Ox3, CN II - XII grossly intact.      Assessment & Plan:   See Encounters Tab for problem based charting.  Patient seen with Dr. Eppie Gibson

## 2016-08-03 NOTE — Progress Notes (Signed)
I saw and evaluated the patient. I personally confirmed the key portions of Dr. Rivka Safer history and exam and reviewed pertinent patient test results. The assessment, diagnosis, and plan were formulated together and I agree with the documentation in the resident's note.  We also stressed the importance of elevating the left leg when sitting and not doing his leg exercises.

## 2016-08-03 NOTE — Patient Instructions (Signed)
Mr. Tout,  It was a pleasure to see you today. Please go to the first floor radiology department to have your x-rays done. I will call you tomorrow with the results. In the meantime, please keep your leg elevated while you are resting to help reducing the swelling in your ankle. If you have any questions or concerns, call our clinic at [phone] or after hours call [phone] and ask for the internal medicine resident on call. Thank you!  - Dr. Philipp Ovens

## 2016-09-14 ENCOUNTER — Ambulatory Visit: Payer: Medicare Other | Admitting: Pharmacist

## 2016-09-18 ENCOUNTER — Telehealth: Payer: Self-pay

## 2016-09-18 NOTE — Telephone Encounter (Signed)
Needs to speak with a nurse about meds. Please call back.  

## 2016-09-20 ENCOUNTER — Telehealth: Payer: Self-pay | Admitting: *Deleted

## 2016-09-20 NOTE — Telephone Encounter (Signed)
Patient's dtr here for samples on Xarelto. Was given 2 wks supply by Dr. Evette Doffing. No follow up appt scheduled yet.

## 2016-09-28 DIAGNOSIS — H903 Sensorineural hearing loss, bilateral: Secondary | ICD-10-CM | POA: Diagnosis not present

## 2016-09-28 DIAGNOSIS — H6122 Impacted cerumen, left ear: Secondary | ICD-10-CM | POA: Diagnosis not present

## 2016-10-23 DIAGNOSIS — R8290 Unspecified abnormal findings in urine: Secondary | ICD-10-CM | POA: Diagnosis not present

## 2016-10-23 DIAGNOSIS — N319 Neuromuscular dysfunction of bladder, unspecified: Secondary | ICD-10-CM | POA: Diagnosis not present

## 2016-10-23 DIAGNOSIS — N3020 Other chronic cystitis without hematuria: Secondary | ICD-10-CM | POA: Diagnosis not present

## 2016-11-01 ENCOUNTER — Telehealth: Payer: Self-pay

## 2016-11-01 NOTE — Telephone Encounter (Signed)
Needs to speak with a nurse about Xarelto. Please call back.

## 2016-11-02 NOTE — Telephone Encounter (Signed)
Pt daughter is calling back to speak with a nurse about getting a sample for Xarelto.

## 2016-11-02 NOTE — Telephone Encounter (Signed)
[name], please look into this also, he's out as of friday

## 2016-11-03 NOTE — Telephone Encounter (Signed)
[name] has resolved

## 2016-11-03 NOTE — Telephone Encounter (Signed)
Have tried to call [place]

## 2016-11-06 ENCOUNTER — Ambulatory Visit: Payer: Medicare Other

## 2016-11-06 ENCOUNTER — Telehealth: Payer: Self-pay | Admitting: Internal Medicine

## 2016-11-06 ENCOUNTER — Telehealth: Payer: Self-pay | Admitting: *Deleted

## 2016-11-06 ENCOUNTER — Ambulatory Visit (INDEPENDENT_AMBULATORY_CARE_PROVIDER_SITE_OTHER): Payer: Medicare Other | Admitting: Internal Medicine

## 2016-11-06 ENCOUNTER — Encounter: Payer: Self-pay | Admitting: Internal Medicine

## 2016-11-06 VITALS — BP 134/89 | HR 65 | Temp 98.0°F | Ht 69.0 in | Wt 192.0 lb

## 2016-11-06 DIAGNOSIS — Z659 Problem related to unspecified psychosocial circumstances: Secondary | ICD-10-CM | POA: Diagnosis not present

## 2016-11-06 DIAGNOSIS — N3001 Acute cystitis with hematuria: Secondary | ICD-10-CM

## 2016-11-06 NOTE — Telephone Encounter (Signed)
Daughter is going to try to get pt to come today, if not tomorrow

## 2016-11-06 NOTE — Telephone Encounter (Signed)
Pls call pt daughter back; Pt may have UTI. Pt had a appt, pt is refusing to come.

## 2016-11-06 NOTE — Telephone Encounter (Signed)
Have spoken, please see other notes 8/27

## 2016-11-06 NOTE — Telephone Encounter (Signed)
Reviewed, was able to find note in care everywhere, was seen 8/13 found to have E coli UTI and was called in Augmentin Rx (urine culture was susceptible)  on 8/16.  Agree with appointment if symptoms did not resolve or new symptoms have developed.

## 2016-11-06 NOTE — Telephone Encounter (Signed)
Spoke to pt's daughter, she states pt has been on amoxicillin recently, prescribed by a dr Rosana Hoes at [facility] urology, checked care everywhere and do not see notes, she estimates it was appr 2 weeks ago that pt was seen at [facility]. She states pt needs xarelto and is having some symptoms that are worrisome for bladder infection. appt is made for this pm in [facility] but she is encouraged to call dr davis's office and speak w/ the staff as to pt seeing dr Rosana Hoes this pm, she states she will do so and call back to tell triage what dr Rosana Hoes advises.

## 2016-11-07 ENCOUNTER — Ambulatory Visit: Payer: Medicare Other

## 2016-11-07 DIAGNOSIS — Z659 Problem related to unspecified psychosocial circumstances: Secondary | ICD-10-CM | POA: Insufficient documentation

## 2016-11-07 LAB — URINALYSIS, COMPLETE
Bilirubin, UA: NEGATIVE
Glucose, UA: NEGATIVE
Ketones, UA: NEGATIVE
Leukocytes, UA: NEGATIVE
Nitrite, UA: NEGATIVE
Protein, UA: NEGATIVE
RBC, UA: NEGATIVE
Specific Gravity, UA: 1.012 (ref 1.005–1.030)
Urobilinogen, Ur: 0.2 mg/dL (ref 0.2–1.0)
pH, UA: 6.5 (ref 5.0–7.5)

## 2016-11-07 LAB — MICROSCOPIC EXAMINATION
Casts: NONE SEEN /lpf
Epithelial Cells (non renal): NONE SEEN /hpf (ref 0–10)

## 2016-11-07 NOTE — Assessment & Plan Note (Addendum)
UTI (resolved). Urology at [facility] recently diagnosed the patient with Escherichia coli UTI susceptible to Augmentin on 10/23/2016. He was prescribed a 1 week course of Augmentin 500 mg 3 times a day. Patient denies having any dysuria or abdominal pain. Denies having any fevers or chills. He is chronically incontinent (wears diapers) and continues to have nocturia. UA checked at this visit is not suggestive of an infection.  Plan -Urology following -No acute intervention needed at this time.

## 2016-11-07 NOTE — Progress Notes (Signed)
Internal Medicine Clinic Attending  I saw and evaluated the patient.  I personally confirmed the key portions of the history and exam documented by Dr. Rathore and I reviewed pertinent patient test results.  The assessment, diagnosis, and plan were formulated together and I agree with the documentation in the resident's note.  

## 2016-11-07 NOTE — Progress Notes (Addendum)
   CC: Patient's daughter is here with the patient to discuss housing options for him.  HPI:  [name] is a 83 y.o. male here with his daughter to discuss housing options. Please see problem based charting for the status of the patient's current and chronic medical conditions.   Past Medical History:  Diagnosis Date  . Anemia   . BPH (benign prostatic hypertrophy)    TURP 05/19/13  . Chronic kidney disease    CHRONIC KIDNEY DISEASE, 2  . Difficulty hearing, right    BILATERAL HEARING LOSS - BEST TO TRY TO SPEAK INTO LEFT EAR  . DVT (deep venous thrombosis) ([place])   . Frequent falls   . Hyperlipidemia   . Hypertension   . Incontinence of urine    SOME INCONTINENCE  . Meningioma ([place])   . Stroke ([place])    Cerebellar, 2013; WALKS WITH WALKER, ABLE TO DRESS AND BATHE HIMSELF BUT FAMILY TRIES TO PROVIDE SUPERVISION BECAUSE OF HIS HX OF FALL AND WEAKNESS LEGS, ARMS   . Thrombocytopenia ([place])    Review of Systems: Pertinent positives mentioned in HPI. Remainder of all ROS negative.   Physical Exam:  Vitals:   11/06/16 1607  BP: 134/89  Pulse: 65  Temp: 98 F (36.7 C)  TempSrc: Oral  SpO2: 95%  Weight: 192 lb (87.1 kg)  Height: 5\' 9"  (1.753 m)   Physical Exam  Constitutional: He appears well-developed and well-nourished. No distress.  HENT:  Head: Normocephalic and atraumatic.  Mouth/Throat: Oropharynx is clear and moist.  Eyes: Right eye exhibits no discharge. Left eye exhibits no discharge.  Cardiovascular: Normal rate, regular rhythm and intact distal pulses.   Pulmonary/Chest: Effort normal and breath sounds normal. No respiratory distress. He has no wheezes. He has no rales.  Abdominal: Soft. Bowel sounds are normal. He exhibits no distension. There is no tenderness.  Musculoskeletal: He exhibits no edema.  Neurological: He is alert.  Oriented to person and place. He knows his birthday. He knows this is August but does not recall the year or date.  Skin: Skin  is warm and dry.  Psychiatric: His behavior is normal.    Assessment & Plan:   See Encounters Tab for problem based charting.  Patient seen with Dr. Angelia Mould

## 2016-11-07 NOTE — Assessment & Plan Note (Addendum)
Patient was accompanied by his daughter to this visit. The daughter stated patient expresses his desire to go to a nursing home whenever he is angry/ upset. Stated she is his healthcare power of attorney and primary caregiver. She was concerned that she does not have the help she needs to take care of him 24 hours a day as the patient does not qualify for home health services due to an insurance problem. Stated the patient is stubborn and will not listen to her. I spoke to the patient and he stated he did not want to go to a nursing home unless if he had to. Daughter then mention that she had heard about [facility] through the patient's PCP earlier and was interested in the program as it would still allow him to stay home with her.  Plan -She has been provided with a brochure for [facility] and advised to contact them. -Explained to her that our clinic is available to answer any questions or provide any further assistance she may need.

## 2016-11-14 ENCOUNTER — Encounter: Payer: Self-pay | Admitting: Pharmacist

## 2016-11-14 ENCOUNTER — Ambulatory Visit (INDEPENDENT_AMBULATORY_CARE_PROVIDER_SITE_OTHER): Payer: Medicare Other | Admitting: Pharmacist

## 2016-11-14 DIAGNOSIS — I82509 Chronic embolism and thrombosis of unspecified deep veins of unspecified lower extremity: Secondary | ICD-10-CM | POA: Diagnosis not present

## 2016-11-14 DIAGNOSIS — Z7901 Long term (current) use of anticoagulants: Secondary | ICD-10-CM | POA: Diagnosis not present

## 2016-11-14 MED ORDER — RIVAROXABAN 10 MG PO TABS: 10.0000 mg | ORAL_TABLET | Freq: Every day | ORAL | 3 refills | Status: DC

## 2016-11-14 NOTE — Patient Instructions (Signed)
Patient educated about medication as defined in this encounter and verbalized understanding by repeating back instructions provided.   

## 2016-11-14 NOTE — Progress Notes (Signed)
[name] is a 83 y.o. male whose daughter reports to the clinic for help with rivaroxaban (Xarelto) therapy.  She has power of attorney for patient's care.  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  atorvastatin (LIPITOR) 20 MG tablet Take 1 tablet (20 mg total) by mouth daily at 6 PM.  irbesartan (AVAPRO) 300 MG tablet Take 1 tablet (300 mg total) by mouth daily.  metoprolol tartrate (LOPRESSOR) 25 MG tablet Take 1 tablet (25 mg total) by mouth 2 (two) times daily.  rivaroxaban (XARELTO) 10 MG TABS tablet Take 1 tablet (10 mg total) by mouth daily. PHARMACIST VOID XARELTO 15 MG PRESCRIPTION   Past Medical History:  Diagnosis Date  . Anemia   . BPH (benign prostatic hypertrophy)    TURP 05/19/13  . Chronic kidney disease    CHRONIC KIDNEY DISEASE, 2  . Difficulty hearing, right    BILATERAL HEARING LOSS - BEST TO TRY TO SPEAK INTO LEFT EAR  . DVT (deep venous thrombosis) ([place])   . Frequent falls   . Hyperlipidemia   . Hypertension   . Incontinence of urine    SOME INCONTINENCE  . Meningioma ([place])   . Stroke ([place])    Cerebellar, 2013; WALKS WITH WALKER, ABLE TO DRESS AND BATHE HIMSELF BUT FAMILY TRIES TO PROVIDE SUPERVISION BECAUSE OF HIS HX OF FALL AND WEAKNESS LEGS, ARMS   . Thrombocytopenia [facility])    Social History   Social History  . Marital status: Widowed    Spouse name: N/A  . Number of children: N/A  . Years of education: N/A   Social History Main Topics  . Smoking status: Never Smoker  . Smokeless tobacco: Never Used  . Alcohol use No  . Drug use: No  . Sexual activity: No   Other Topics Concern  . Not on file   Social History Narrative   Lives with daughter. Ambulates w walker. Can make basic meals (cereal). Very hard of hearing.    Family History  Problem  Relation Age of Onset  . Diabetes Mother   . Heart disease Mother   . Hypertension Mother   . Heart disease Father   . Hypertension Father   . Hypertension Daughter    Labs: Component Value Date/Time   AST 18 01/26/2016 2352   ALT 11 (L) 01/26/2016 2352   NA 144 02/07/2016   K 4.7 02/07/2016   CL 106 01/27/2016 0311   CO2 25 01/27/2016 0311   GLUCOSE 108 (H) 01/27/2016 0311   HGBA1C 5.8 (H) 12/19/2013 0427   BUN 18 02/07/2016   CREATININE 1.1 02/07/2016   CREATININE 1.23 01/27/2016 0311   CREATININE 0.96 01/15/2014 1648   CALCIUM 10.1 01/27/2016 0311   GFRAA 58 (L) 01/27/2016 0311   GFRAA 82 01/15/2014 1648   WBC 8.0 02/07/2016   WBC 9.1 01/27/2016 0311   HGB 10.6 (A) 02/07/2016   HCT 33 (A) 02/07/2016   PLT 179 02/07/2016   A/P Reduce rivaroxaban to 10 mg daily (discussed w/ PCP and approved)  Safety: Patient reports no recent signs or symptoms of bleeding, no signs of symptoms of thromboembolism. Medication changes: no.  Renal/hepatic/drug interaction concerns: no.  Adherence: Patient's daughter does correctly recite the dose. Patient reports needing help with medication cost. Applied for Medicare Extra Help, tier exception, and also submitted  a letter of medical necessity.   Patient Instructions: Patient advised to contact clinic or seek medical attention if signs/symptoms of bleeding or thromboembolism occur. Patient verbalized understanding by repeating back information.  Follow-up Next appointment due with PCP around 12/04/16  [place] Pharmacist  11/14/2016, 3:40 PM

## 2016-11-15 ENCOUNTER — Ambulatory Visit: Payer: Medicare Other | Admitting: Pharmacist

## 2016-11-16 DIAGNOSIS — R8290 Unspecified abnormal findings in urine: Secondary | ICD-10-CM | POA: Diagnosis not present

## 2017-01-19 DIAGNOSIS — N3020 Other chronic cystitis without hematuria: Secondary | ICD-10-CM | POA: Diagnosis not present

## 2017-01-19 DIAGNOSIS — N319 Neuromuscular dysfunction of bladder, unspecified: Secondary | ICD-10-CM | POA: Diagnosis not present

## 2017-01-24 ENCOUNTER — Other Ambulatory Visit: Payer: Self-pay

## 2017-01-24 ENCOUNTER — Emergency Department (HOSPITAL_COMMUNITY)
Admission: EM | Admit: 2017-01-24 | Discharge: 2017-01-24 | Disposition: A | Discharge status: Home / Self Care | Payer: Medicare Other | Attending: Emergency Medicine | Admitting: Emergency Medicine

## 2017-01-24 ENCOUNTER — Encounter (HOSPITAL_COMMUNITY): Payer: Self-pay

## 2017-01-24 DIAGNOSIS — F0390 Unspecified dementia without behavioral disturbance: Secondary | ICD-10-CM | POA: Diagnosis not present

## 2017-01-24 DIAGNOSIS — I129 Hypertensive chronic kidney disease with stage 1 through stage 4 chronic kidney disease, or unspecified chronic kidney disease: Secondary | ICD-10-CM | POA: Insufficient documentation

## 2017-01-24 DIAGNOSIS — R310 Gross hematuria: Secondary | ICD-10-CM

## 2017-01-24 DIAGNOSIS — R319 Hematuria, unspecified: Secondary | ICD-10-CM | POA: Diagnosis present

## 2017-01-24 DIAGNOSIS — T83091A Other mechanical complication of indwelling urethral catheter, initial encounter: Secondary | ICD-10-CM | POA: Diagnosis not present

## 2017-01-24 DIAGNOSIS — N183 Chronic kidney disease, stage 3 (moderate): Secondary | ICD-10-CM | POA: Insufficient documentation

## 2017-01-24 DIAGNOSIS — Z79899 Other long term (current) drug therapy: Secondary | ICD-10-CM | POA: Diagnosis not present

## 2017-01-24 DIAGNOSIS — R102 Pelvic and perineal pain: Secondary | ICD-10-CM | POA: Diagnosis not present

## 2017-01-24 LAB — URINALYSIS, ROUTINE W REFLEX MICROSCOPIC
Bilirubin Urine: NEGATIVE
Glucose, UA: NEGATIVE mg/dL
Ketones, ur: NEGATIVE mg/dL
Nitrite: POSITIVE — AB
Protein, ur: NEGATIVE mg/dL
Specific Gravity, Urine: 1.009 (ref 1.005–1.030)
Squamous Epithelial / LPF: NONE SEEN
pH: 7 (ref 5.0–8.0)

## 2017-01-24 MED ORDER — CEPHALEXIN 500 MG PO CAPS: 500.0000 mg | ORAL_CAPSULE | Freq: Two times a day (BID) | ORAL | 0 refills | Status: AC

## 2017-01-24 NOTE — ED Notes (Signed)
ED Provider at bedside. 

## 2017-01-24 NOTE — ED Provider Notes (Signed)
[facility] DEPT Provider Note   CSN: [id number] Arrival date & time: 01/24/17  1336     History   Chief Complaint Chief Complaint  Patient presents with  . Hematuria    HPI [name] is a 83 y.o. male with a h/o of urinary incontinence and enuresis with an indwelling catheter placed on 11/9 and recurrent DVT on Xarelto who presents to the emergency department with a chief complaint of hematuria that began this morning after the patient was cleaning the area with a wash cloth.  She states she wrapped the area with a T-shirt because it continued to bleed.  No other treatment prior to arrival.  She called 911 because she became concerned, and the bleeding stopped after EMS was present.  She denies of recent fever, chills, back pain, abdominal pain, hematuria prior to today's episode, or other new symptoms.   The Foley catheter was placed at the urology office visit on 01/19/2017, patient's urinalysis grew greater than 100,000 colonies of E. Coli, but after speaking with staff at the office, it appears that an antibiotic was not called in.  The patient was recently on a course of Augmentin in September for UTI.  He is scheduled to follow-up with his urologist around December 9.  The history is provided by a relative. No language interpreter was used.  Hematuria  This is a new problem. The current episode started 1 to 2 hours ago. The problem occurs constantly. The problem has been resolved. Pertinent negatives include no chest pain, no abdominal pain, no headaches and no shortness of breath. Exacerbated by: tugging on his foley. Nothing relieves the symptoms. Treatments tried: applying direct pressure. The treatment provided significant relief.    Past Medical History:  Diagnosis Date  . Anemia   . BPH (benign prostatic hypertrophy)    TURP 05/19/13  . Chronic kidney disease    CHRONIC KIDNEY DISEASE, 2  . Difficulty hearing, right    BILATERAL HEARING  LOSS - BEST TO TRY TO SPEAK INTO LEFT EAR  . DVT (deep venous thrombosis) ([place])   . Frequent falls   . Hyperlipidemia   . Hypertension   . Incontinence of urine    SOME INCONTINENCE  . Meningioma ([place])   . Stroke ([place])    Cerebellar, 2013; WALKS WITH WALKER, ABLE TO DRESS AND BATHE HIMSELF BUT FAMILY TRIES TO PROVIDE SUPERVISION BECAUSE OF HIS HX OF FALL AND WEAKNESS LEGS, ARMS   . Thrombocytopenia [facility])     Patient Active Problem List   Diagnosis Date Noted  . Social problem 11/07/2016  . Left ankle swelling 08/03/2016  . Dementia 01/28/2016  . Gait instability 01/26/2016  . Hypernatremia   . Hematuria 02/26/2015  . Weakness 02/26/2015  . UTI (urinary tract infection) 07/17/2014  . Presence of IVC filter 07/17/2014  . Right knee pain 05/25/2014  . Long term current use of anticoagulant therapy- Xarelto 03/26/2014  . Onychomycosis 01/15/2014  . Vitreous hemorrhage ([place]) 12/21/2013  . Cerebral embolism with cerebral infarction ([place]) 12/19/2013  . Gait abnormality 12/18/2013  . Left adrenal mass ([place]) 09/11/2013  . Orthostatic hypotension 07/10/2013  . CKD (chronic kidney disease) stage 3, GFR 30-59 ml/min (HCC) 06/23/2013  . Hyperlipidemia   . Urinary retention 06/15/2013  . Normocytic anemia 06/11/2013  . Leg DVT (deep venous thromboembolism), chronic ([place]) 06/10/2013  . Physical deconditioning 05/30/2013  . BPH (benign prostatic hyperplasia) 04/24/2013  . Preoperative examination 04/24/2013  . Primary hyperparathyroidism ([place]) 09/02/2012  .  Health care maintenance 08/30/2012  . Frequent falls 06/23/2012  . 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 Hearing loss 07/20/2011  . Meningioma ([place]) 07/20/2011  . HTN (hypertension) 07/19/2011  . CVA (cerebral infarction) 07/19/2011    Past Surgical History:  Procedure Laterality Date  . MYRINGOTOMY WITH TUBE PLACEMENT Bilateral         Home Medications    Prior to Admission medications   Medication Sig Start Date End Date Taking? Authorizing Provider  amLODipine (NORVASC) 5 MG tablet Take 1 tablet (5 mg total) by mouth daily. 04/27/16  Yes [name], DO  atorvastatin (LIPITOR) 20 MG tablet Take 1 tablet (20 mg total) by mouth daily at 6 PM. 04/27/16  Yes Hoffman, Rachel Moulds, DO  irbesartan (AVAPRO) 300 MG tablet Take 1 tablet (300 mg total) by mouth daily. 02/15/16  Yes Asencion Partridge, MD  metoprolol tartrate (LOPRESSOR) 25 MG tablet Take 1 tablet (25 mg total) by mouth 2 (two) times daily. 04/27/16  Yes [name], DO  rivaroxaban (XARELTO) 10 MG TABS tablet Take 1 tablet (10 mg total) by mouth daily. PHARMACIST VOID XARELTO 15 MG PRESCRIPTION 11/14/16  Yes [name], DO  cephALEXin (KEFLEX) 500 MG capsule Take 1 capsule (500 mg total) 2 (two) times daily for 7 days by mouth. 01/24/17 01/31/17  Becki Mccaskill A, PA-C    Family History Family History  Problem Relation Age of Onset  . Diabetes Mother   . Heart disease Mother   . Hypertension Mother   . Heart disease Father   . Hypertension Father   . Hypertension Daughter     Social History Social History   Tobacco Use  . Smoking status: Never Smoker  . Smokeless tobacco: Never Used  Substance Use Topics  . Alcohol use: No    Alcohol/week: 0.0 oz  . Drug use: No     Allergies   Patient has no known allergies.   Review of Systems Review of Systems  Constitutional: Negative for activity change and chills.  Eyes: Negative for visual disturbance.  Respiratory: Negative for shortness of breath.   Cardiovascular: Negative for chest pain.  Gastrointestinal: Negative for abdominal pain, diarrhea, nausea and vomiting.  Genitourinary: Positive for hematuria and penile pain. Negative for difficulty urinating, dysuria, flank pain, frequency and urgency.  Musculoskeletal: Negative for back pain.  Skin: Negative for rash.  Neurological: Negative  for weakness, numbness and headaches.  Psychiatric/Behavioral: Negative for confusion.   Physical Exam Updated Vital Signs BP (!) 163/97   Pulse 65   Temp 98.7 F (37.1 C) (Oral)   Resp 20   SpO2 96%   Physical Exam  Constitutional: He appears well-developed.  HENT:  Head: Normocephalic.  Eyes: Conjunctivae are normal.  Neck: Neck supple.  Cardiovascular: Normal rate and regular rhythm.  No murmur heard. Pulmonary/Chest: Effort normal and breath sounds normal. No stridor. No respiratory distress. He has no wheezes. He has no rales.  Abdominal: Soft. He exhibits no distension.  Genitourinary:  Genitourinary Comments: Foley catheter is in place.  No gross blood noted in the collection bag.  A small amount of blood is noted along the length of the catheter.  No active bleeding at the urethral opening. There is a 2 mm skin tear at around 3 o'clocl on the glans penis. No other obvious injury or active bleeding. Mild TTP over the glans penis. No edema, erythema, or warmth.  Neurological: He is alert.  Skin: Skin is warm and dry.  Psychiatric: His behavior is normal.  Nursing note and vitals reviewed.    ED Treatments / Results  Labs (all labs ordered are listed, but only abnormal results are displayed) Labs Reviewed  URINALYSIS, ROUTINE W REFLEX MICROSCOPIC - Abnormal; Notable for the following components:      Result Value   Color, Urine STRAW (*)    Hgb urine dipstick MODERATE (*)    Nitrite POSITIVE (*)    Leukocytes, UA LARGE (*)    Bacteria, UA RARE (*)    All other components within normal limits    EKG  EKG Interpretation None       Radiology No results found.  Procedures Procedures (including critical care time)  Medications Ordered in ED Medications - No data to display   Initial Impression / Assessment and Plan / ED Course  I have reviewed the triage vital signs and the nursing notes.  Pertinent labs & imaging results that were available during my  care of the patient were reviewed by me and considered in my medical decision making (see chart for details).     66-year-old male with a history of urinary incontinence s/p Foley catheter and recurrent DVT on Xarelto presenting with gross hematuria that began this morning after he washed himself with a washcloth.  The patient was seen and evaluated with Dr. Lorretta Harp, attending physician.  The bleeding has since resolved.  Urinalysis consistent with UTI.  Review the patient's record.  Patient was seen by urology on November 9 and a urinalysis and culture was obtained, but antibiotics were not initiated.  We will start the patient on a course of Keflex based on culture and sensitivities from urology visit.  Nursing staff in the ED provided Foley care and education with the patient's daughter, who is his primary caregiver.  Recommended following up with urology if she continues to have additional questions about Foley catheter care.  Doubt pyelonephritis, sepsis, or anemia at this time.  At this time, I feel that no further emergent workup is indicated.  Answered all questions.  Strict return precautions given.  No acute distress.  The patient is safe for discharge at this time.  Final Clinical Impressions(s) / ED Diagnoses   Final diagnoses:  Gross hematuria    ED Discharge Orders        Ordered    cephALEXin (KEFLEX) 500 MG capsule  2 times daily     01/24/17 1616       Khady Vandenberg A, PA-C 01/24/17 1628    Isla Pence, MD 01/24/17 1651

## 2017-01-24 NOTE — ED Triage Notes (Signed)
He comes to [place] from home with report of having "pulled on" his foley catheter whilst cleaning himself, after which he experienced some hematuria. He and his family were concernesd as he is on Xarelto. He arrives here in no distress, with the hematuria having ceased.

## 2017-01-24 NOTE — ED Notes (Signed)
Bed: QP61 Expected date:  Expected time:  Means of arrival:  Comments: EMS 90YM cath bleeding

## 2017-01-24 NOTE — Discharge Instructions (Signed)
Take 1 tablet of Keflex every 12 hours for the next 7 days.  Keflex as an antibiotic that is used to treat infection in the urine.  I have attached written instructions on how to care for a foley catheter.  If you have additional questions, call your urologist. The nursing staff at the office should be able to assist with questions.   If the patient develops new symptoms, including new bleeding from the penis, blood in his urine, fever, chills, or new, severe back pain, please return to the emergency department for re-evaluation.

## 2017-02-05 ENCOUNTER — Other Ambulatory Visit: Payer: Self-pay | Admitting: *Deleted

## 2017-02-05 ENCOUNTER — Other Ambulatory Visit: Payer: Self-pay

## 2017-02-05 MED ORDER — IRBESARTAN 300 MG PO TABS: 300.0000 mg | ORAL_TABLET | Freq: Every day | ORAL | 2 refills | Status: DC

## 2017-02-05 NOTE — Telephone Encounter (Signed)
Call made both patient and pharmacy-she does NOT need atorvastatin or xarelto refilled.  She was calling about irbesartan refill-which was filled earlier today.  No further action needed-phone call complete.Despina Hidden Cassady11/26/20182:48 PM

## 2017-02-05 NOTE — Telephone Encounter (Signed)
rivaroxaban (XARELTO) 10 MG TABS tablet   atorvastatin (LIPITOR) 20 MG tablet, refill request. Please call pt back.

## 2017-02-07 ENCOUNTER — Encounter (HOSPITAL_COMMUNITY): Payer: Self-pay | Admitting: Emergency Medicine

## 2017-02-07 ENCOUNTER — Emergency Department (HOSPITAL_COMMUNITY): Payer: Medicare Other

## 2017-02-07 ENCOUNTER — Emergency Department (HOSPITAL_COMMUNITY)
Admission: EM | Admit: 2017-02-07 | Discharge: 2017-02-07 | Disposition: A | Discharge status: Home / Self Care | Payer: Medicare Other | Attending: Emergency Medicine | Admitting: Emergency Medicine

## 2017-02-07 ENCOUNTER — Other Ambulatory Visit: Payer: Self-pay

## 2017-02-07 DIAGNOSIS — R4781 Slurred speech: Secondary | ICD-10-CM | POA: Diagnosis not present

## 2017-02-07 DIAGNOSIS — R531 Weakness: Secondary | ICD-10-CM | POA: Diagnosis present

## 2017-02-07 DIAGNOSIS — Z79899 Other long term (current) drug therapy: Secondary | ICD-10-CM | POA: Diagnosis not present

## 2017-02-07 DIAGNOSIS — I129 Hypertensive chronic kidney disease with stage 1 through stage 4 chronic kidney disease, or unspecified chronic kidney disease: Secondary | ICD-10-CM | POA: Diagnosis not present

## 2017-02-07 DIAGNOSIS — N182 Chronic kidney disease, stage 2 (mild): Secondary | ICD-10-CM | POA: Diagnosis not present

## 2017-02-07 DIAGNOSIS — Z8673 Personal history of transient ischemic attack (TIA), and cerebral infarction without residual deficits: Secondary | ICD-10-CM | POA: Diagnosis not present

## 2017-02-07 LAB — URINALYSIS, ROUTINE W REFLEX MICROSCOPIC
Bilirubin Urine: NEGATIVE
Glucose, UA: NEGATIVE mg/dL
Ketones, ur: NEGATIVE mg/dL
Nitrite: NEGATIVE
Protein, ur: 30 mg/dL — AB
Specific Gravity, Urine: 1.012 (ref 1.005–1.030)
pH: 8 (ref 5.0–8.0)

## 2017-02-07 LAB — I-STAT CHEM 8, ED
BUN: 18 mg/dL (ref 6–20)
Calcium, Ion: 1.33 mmol/L (ref 1.15–1.40)
Chloride: 101 mmol/L (ref 101–111)
Creatinine, Ser: 1.2 mg/dL (ref 0.61–1.24)
Glucose, Bld: 125 mg/dL — ABNORMAL HIGH (ref 65–99)
HCT: 37 % — ABNORMAL LOW (ref 39.0–52.0)
Hemoglobin: 12.6 g/dL — ABNORMAL LOW (ref 13.0–17.0)
Potassium: 3.8 mmol/L (ref 3.5–5.1)
Sodium: 142 mmol/L (ref 135–145)
TCO2: 29 mmol/L (ref 22–32)

## 2017-02-07 LAB — DIFFERENTIAL
Basophils Absolute: 0 10*3/uL (ref 0.0–0.1)
Basophils Relative: 0 %
Eosinophils Absolute: 0.3 10*3/uL (ref 0.0–0.7)
Eosinophils Relative: 3 %
Lymphocytes Relative: 21 %
Lymphs Abs: 1.8 10*3/uL (ref 0.7–4.0)
Monocytes Absolute: 0.8 10*3/uL (ref 0.1–1.0)
Monocytes Relative: 9 %
Neutro Abs: 5.6 10*3/uL (ref 1.7–7.7)
Neutrophils Relative %: 67 %

## 2017-02-07 LAB — CBC
HCT: 37.1 % — ABNORMAL LOW (ref 39.0–52.0)
Hemoglobin: 11.8 g/dL — ABNORMAL LOW (ref 13.0–17.0)
MCH: 29.3 pg (ref 26.0–34.0)
MCHC: 31.8 g/dL (ref 30.0–36.0)
MCV: 92.1 fL (ref 78.0–100.0)
Platelets: 170 10*3/uL (ref 150–400)
RBC: 4.03 MIL/uL — ABNORMAL LOW (ref 4.22–5.81)
RDW: 14.8 % (ref 11.5–15.5)
WBC: 8.4 10*3/uL (ref 4.0–10.5)

## 2017-02-07 LAB — COMPREHENSIVE METABOLIC PANEL
ALT: 11 U/L — ABNORMAL LOW (ref 17–63)
AST: 19 U/L (ref 15–41)
Albumin: 3.3 g/dL — ABNORMAL LOW (ref 3.5–5.0)
Alkaline Phosphatase: 63 U/L (ref 38–126)
Anion gap: 6 (ref 5–15)
BUN: 16 mg/dL (ref 6–20)
CO2: 28 mmol/L (ref 22–32)
Calcium: 10.2 mg/dL (ref 8.9–10.3)
Chloride: 104 mmol/L (ref 101–111)
Creatinine, Ser: 1.24 mg/dL (ref 0.61–1.24)
GFR calc Af Amer: 57 mL/min — ABNORMAL LOW (ref >60)
GFR calc non Af Amer: 49 mL/min — ABNORMAL LOW (ref >60)
Glucose, Bld: 127 mg/dL — ABNORMAL HIGH (ref 65–99)
Potassium: 3.8 mmol/L (ref 3.5–5.1)
Sodium: 138 mmol/L (ref 135–145)
Total Bilirubin: 0.4 mg/dL (ref 0.3–1.2)
Total Protein: 6.9 g/dL (ref 6.5–8.1)

## 2017-02-07 LAB — PROTIME-INR
INR: 1.45
Prothrombin Time: 17.5 seconds — ABNORMAL HIGH (ref 11.4–15.2)

## 2017-02-07 LAB — I-STAT TROPONIN, ED: Troponin i, poc: 0.02 ng/mL (ref 0.00–0.08)

## 2017-02-07 LAB — APTT: aPTT: 41 seconds — ABNORMAL HIGH (ref 24–36)

## 2017-02-07 NOTE — ED Provider Notes (Signed)
MSE was initiated and I personally evaluated the patient and placed orders (if any) at  12:42 AM on February 07, 2017.  The patient appears stable so that the remainder of the MSE may be completed by another provider.   Patient is a 83 year old male with previous stroke.  Presents with his daughter with difficulty getting up tonight and walking when he was trying to get into bed.  The last time she saw him able to walk normally was earlier this morning.  She states that he was sitting down around 9 PM and states that he "looks normal".  Will order labs, urine, head CT to evaluate for possible stroke but he does not meet code stroke criteria at this time.  Not a TPA candidate.   Atziri Zubiate, Delice Bison, DO 02/07/17 [phone]

## 2017-02-07 NOTE — ED Notes (Signed)
PT states understanding of care given, follow up care. PT ambulated from ED to car with a steady gait.  

## 2017-02-07 NOTE — ED Notes (Signed)
Pt's daughter had to run home but says she will be back.

## 2017-02-07 NOTE — ED Triage Notes (Signed)
Per daughter pt was last seen normal yesterday at 2130 and at 2300 pt started feeling dizzy and unable to stand. Pt usually able to stand and walk from a chair to his bed and unable to do this tonight.

## 2017-02-07 NOTE — ED Notes (Signed)
Stood pt at bedside and took a couple steps, pt was a little unsteady, but did okay.

## 2017-02-08 LAB — URINE CULTURE

## 2017-02-09 DIAGNOSIS — R531 Weakness: Secondary | ICD-10-CM | POA: Diagnosis not present

## 2017-02-09 DIAGNOSIS — R339 Retention of urine, unspecified: Secondary | ICD-10-CM | POA: Diagnosis not present

## 2017-02-09 DIAGNOSIS — N319 Neuromuscular dysfunction of bladder, unspecified: Secondary | ICD-10-CM | POA: Diagnosis not present

## 2017-02-12 ENCOUNTER — Ambulatory Visit: Payer: Medicare Other

## 2017-02-13 ENCOUNTER — Other Ambulatory Visit: Payer: Self-pay

## 2017-02-13 ENCOUNTER — Ambulatory Visit (INDEPENDENT_AMBULATORY_CARE_PROVIDER_SITE_OTHER): Payer: Medicare Other | Admitting: Internal Medicine

## 2017-02-13 VITALS — BP 155/84 | HR 62 | Temp 97.8°F | Wt 197.8 lb

## 2017-02-13 DIAGNOSIS — Z9181 History of falling: Secondary | ICD-10-CM | POA: Diagnosis not present

## 2017-02-13 DIAGNOSIS — Z86718 Personal history of other venous thrombosis and embolism: Secondary | ICD-10-CM | POA: Diagnosis not present

## 2017-02-13 DIAGNOSIS — Z8673 Personal history of transient ischemic attack (TIA), and cerebral infarction without residual deficits: Secondary | ICD-10-CM

## 2017-02-13 DIAGNOSIS — Z87440 Personal history of urinary (tract) infections: Secondary | ICD-10-CM

## 2017-02-13 DIAGNOSIS — E785 Hyperlipidemia, unspecified: Secondary | ICD-10-CM | POA: Diagnosis not present

## 2017-02-13 DIAGNOSIS — H5500 Unspecified nystagmus: Secondary | ICD-10-CM

## 2017-02-13 DIAGNOSIS — R32 Unspecified urinary incontinence: Secondary | ICD-10-CM | POA: Diagnosis not present

## 2017-02-13 DIAGNOSIS — R42 Dizziness and giddiness: Secondary | ICD-10-CM

## 2017-02-13 DIAGNOSIS — I63341 Cerebral infarction due to thrombosis of right cerebellar artery: Secondary | ICD-10-CM

## 2017-02-13 DIAGNOSIS — G3281 Cerebellar ataxia in diseases classified elsewhere: Secondary | ICD-10-CM | POA: Insufficient documentation

## 2017-02-13 DIAGNOSIS — R2689 Other abnormalities of gait and mobility: Secondary | ICD-10-CM

## 2017-02-13 DIAGNOSIS — R339 Retention of urine, unspecified: Secondary | ICD-10-CM | POA: Diagnosis not present

## 2017-02-13 DIAGNOSIS — I10 Essential (primary) hypertension: Secondary | ICD-10-CM | POA: Diagnosis not present

## 2017-02-13 NOTE — Patient Instructions (Addendum)
FOLLOW-UP INSTRUCTIONS When: 1 week in [facility] clinic For: Dizziness follow up  What to bring:   Thank you for seeing [place] in the clinic today!  You were evaluated for dizziness. We ordered some tests during our visit today. You will be called about a scan for your brain. Please go to this test whenever they call you for an appointment. I will call you to discuss the results of your tests later this week.  Please return to the clinic in 1 week for follow up of your dizziness.  If you have any questions or concerns, please call our clinic at [phone] between 9am-5pm and after hours call [phone] and ask for the internal medicine resident on call. If you feel you are having a medical emergency please call 911.   Dr. Thomasene Ripple

## 2017-02-13 NOTE — Assessment & Plan Note (Addendum)
Patient recently had indwelling catheter removed on 02/11/2017 by urology. Patient is incontinent of urine at baseline but denies urinary symptoms of urgency, dysuria, and foul smelling urine. Patient has had similar constellation of symptoms as a result of UTI in the past, but was recently treated for infection in ED and his symptoms of dizziness, gait instability have not improved. Will check urinalysis and culture now that catheter has been removed to ensure recent UTI cleared.  Plan: -Urinalysis and culture, I&O cath required  Addendum:  Patient's urine culture returned >100,000 Proteus that is resistant to Macrobid but sensitive to Bactrim. Prescribed 7 days of antibiotic therapy. Called patient with news, and daughter suspected UTI was cause of his symptoms. Patient's daughter describes worsening of his symptoms of dizziness. His MRI was not able to be scheduled until 02/22/17. I called facility and verified that there are no openings until this time, even though MRI ordered STAT. I recommended to patient's daughter that she bring patient to emergency room if his symptoms continue to worsen or if new worrisome symptoms, such as headache, focal weakness, or difficulty with speech develop. Patient's daughter described reluctance to go to ED because she doesn't want father to end up in [place] home, but was agreeable to bring him in for evaluation if symptoms continue to worsen.

## 2017-02-13 NOTE — Assessment & Plan Note (Addendum)
Patient recently evaluated for this constellation of symptoms in ED. BMP showed no electrolyte abnormalities. Urinalysis did not show new UTI. Patient's head CT negative for acute intracranial process at that time. Patient has prior history of stroke involving R cerebellar artery per chart review. Patient's daughter states current symptoms are similar to his previous stroke. Patient's complaints of dizziness, visible gait instability during exam, and nystagmus are concerning for posterior stroke. Will plan to obtain MRI to evaluate for new stroke as a cause of his symptoms. If new stroke did occur, plan to increase statin to reduce stroke risk and obtain home health PT/OT/Speech for recovery. Patient would significantly benefit from these services, as his acute change in gait instability and dizziness have resulted in significant change in ability to walk/transfer from bed to wheelchair with walker per daughter report.  Plan: -Brain MRI to evaluate for posterior stroke -Consider medication changes, including statin therapy, to reduce stroke risk pending results of above study -Consider home health services/evaluation for acute change in gait instability pending results of above study

## 2017-02-13 NOTE — Progress Notes (Signed)
CC: Dizziness  HPI:  [name] is a 83 y.o. with PMH of HTN, HLD, frequent falls, DVT, and prior CVA who presents for evaluation of worsening dizziness and gait instability for the past week or so. Patient's daughter was present for interview and provided much of the history. Per chart review patient has been to the ED twice in past month for evaluation of weakness, hematuria, and dizziness. On first ED visit he was treated with Keflex x 7 days for UTI 2/2 indwelling catheter use, which was subsequently removed two weeks after treatment. On seconds ED visit patient reported increased dizziness, nausea, vomiting, and weakness. Workup at that time did not show new UTI and head CT was negative for acute intracranial process. Patient reports continuation of the above symptoms today, which is why he sought care. His daughter also notes that he has had new episodes of leaning to his right side while sitting in wheelchair or standing from seated position. She also noticed that over the past few days the patient's eyes look like "they are dancing around" and that he seems to have trouble focusing. Patient states his dizziness is worse with standing but also occurs while sitting. Patient endorses headaches in addition to dizziness. Patient states that closing his eyes helps with dizziness. No focal numbness or weakness noted.   Past Medical History: Past Medical History:  Diagnosis Date  . Anemia   . BPH (benign prostatic hypertrophy)    TURP 05/19/13  . Chronic kidney disease    CHRONIC KIDNEY DISEASE, 2  . Difficulty hearing, right    BILATERAL HEARING LOSS - BEST TO TRY TO SPEAK INTO LEFT EAR  . DVT (deep venous thrombosis) ([place])   . Frequent falls   . Hyperlipidemia   . Hypertension   . Incontinence of urine    SOME INCONTINENCE  . Meningioma ([place])   . Stroke ([place])    Cerebellar, 2013; WALKS WITH WALKER, ABLE TO DRESS AND BATHE HIMSELF BUT FAMILY TRIES TO PROVIDE SUPERVISION BECAUSE OF  HIS HX OF FALL AND WEAKNESS LEGS, ARMS   . Thrombocytopenia ([place])    Review of Systems:   Patient endorses dizziness and worsening gait instability, as per HPI Patient denies chest pain, shortness of breath, abdominal pain, diaphoresis, nausea/vomiting, lower extremity swelling, and change in bowel/bladder habits.  Physical Exam:  Vitals:   02/13/17 1502  BP: (!) 155/84  Pulse: 62  Temp: 97.8 F (36.6 C)  TempSrc: Oral  SpO2: 98%  Weight: 197 lb 12.8 oz (89.7 kg)   Physical Exam  Constitutional:  Elderly gentleman sitting comfortably in wheelchair in no acute distress  HENT:  Mouth/Throat: No oropharyngeal exudate.  Eyes: EOM are normal. Pupils are equal, round, and reactive to light.  Horizontal nystagmus noted bilaterally  Cardiovascular: Normal rate, regular rhythm and intact distal pulses. Exam reveals no friction rub.  No murmur heard. Respiratory: Effort normal. No respiratory distress. He has no wheezes. He has no rales.  GI: Soft. Bowel sounds are normal. He exhibits no distension. There is no tenderness.  Lymphadenopathy:    He has no cervical adenopathy.  Neurological:  Patient not oriented to setting or time. Face strength and sensation intact bilaterally. Tongue midline. 5/5 bicep, tricep, and grip strength bilaterally. 5/5 hip flexion and plantarflexion bilaterally. Gross sensation to light touch of upper and lower extremities intact bilaterally. Difficulty completing RAM 2/2 confusion during exam.   Skin: Skin is warm and dry. No rash noted. No erythema.  Assessment & Plan:   See Encounters Tab for problem based charting.  Patient seen with Dr. Lynnae January.

## 2017-02-14 LAB — URINALYSIS, ROUTINE W REFLEX MICROSCOPIC
Bilirubin, UA: NEGATIVE
Glucose, UA: NEGATIVE
Ketones, UA: NEGATIVE
Nitrite, UA: NEGATIVE
RBC, UA: NEGATIVE
Specific Gravity, UA: 1.014 (ref 1.005–1.030)
Urobilinogen, Ur: 1 mg/dL (ref 0.2–1.0)
pH, UA: >=9 — AB (ref 5.0–7.5)

## 2017-02-14 LAB — MICROSCOPIC EXAMINATION
Casts: NONE SEEN /lpf
Epithelial Cells (non renal): NONE SEEN /hpf (ref 0–10)
RBC, UA: NONE SEEN /hpf (ref 0–?)

## 2017-02-14 NOTE — Progress Notes (Signed)
Internal Medicine Clinic Attending  I saw and evaluated the patient.  I personally confirmed the key portions of the history and exam documented by Dr. Berneice Gandy and I reviewed pertinent patient test results.  The assessment, diagnosis, and plan were formulated together and I agree with the documentation in the resident's note. Pt tx be ED for UTI on 11/16. Urology checked urine cx on 12/2 at time of D/C'ing foley and + Ecoli and Morganella. No tx. I am not convinced he has recurrent or resistant UTI (12/2 bugs sens to the 11/16 ABX) so will recheck cx - clean catch or I/O cath.

## 2017-02-15 LAB — URINE CULTURE

## 2017-02-16 MED ORDER — SULFAMETHOXAZOLE-TRIMETHOPRIM 400-80 MG PO TABS: 1.0000 | ORAL_TABLET | Freq: Two times a day (BID) | ORAL | 0 refills | Status: DC

## 2017-02-16 NOTE — Addendum Note (Signed)
Addended by: Thomasene Ripple A on: 02/16/2017 12:13 PM   Modules accepted: Orders

## 2017-02-17 NOTE — ED Provider Notes (Signed)
[facility] EMERGENCY DEPARTMENT Provider Note   CSN: [id number] Arrival date & time: 02/07/17  0018     History   Chief Complaint Chief Complaint  Patient presents with  . Weakness    HPI [name] is a 83 y.o. male.  The history is provided by the patient.  Patient is a 83 year old male was brought to the emergency department by his family member.  He had more difficulty ambulating today.  Symptoms began earlier this evening.  She last saw him many seem normal.  He began having more difficulty ambulating and therefore is brought to the emergency department.  She feels like he has returned close to baseline at this time.  No difficulty with speech.  No abnormal facial droop.  No headache at this time.  Patient without focus complaint  Past Medical History:  Diagnosis Date  . Anemia   . BPH (benign prostatic hypertrophy)    TURP 05/19/13  . Chronic kidney disease    CHRONIC KIDNEY DISEASE, 2  . Difficulty hearing, right    BILATERAL HEARING LOSS - BEST TO TRY TO SPEAK INTO LEFT EAR  . DVT (deep venous thrombosis) ([place])   . Frequent falls   . Hyperlipidemia   . Hypertension   . Incontinence of urine    SOME INCONTINENCE  . Meningioma ([place])   . Stroke ([place])    Cerebellar, 2013; WALKS WITH WALKER, ABLE TO DRESS AND BATHE HIMSELF BUT FAMILY TRIES TO PROVIDE SUPERVISION BECAUSE OF HIS HX OF FALL AND WEAKNESS LEGS, ARMS   . Thrombocytopenia [facility])     Patient Active Problem List   Diagnosis Date Noted  . 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 Social problem 11/07/2016  . Left ankle swelling 08/03/2016  . Dementia 01/28/2016  . Gait instability 01/26/2016  . Hypernatremia   . Hematuria 02/26/2015  . Weakness 02/26/2015  . UTI (urinary tract infection) 07/17/2014  . Presence of IVC filter 07/17/2014  . Right knee pain 05/25/2014  . Long term current use of anticoagulant therapy- Xarelto 03/26/2014  . Onychomycosis  01/15/2014  . Vitreous hemorrhage ([place]) 12/21/2013  . Cerebral embolism with cerebral infarction ([place]) 12/19/2013  . Gait abnormality 12/18/2013  . Left adrenal mass ([place]) 09/11/2013  . Orthostatic hypotension 07/10/2013  . CKD (chronic kidney disease) stage 3, GFR 30-59 ml/min ([facility]) 06/23/2013  . Hyperlipidemia   . Urinary retention 06/15/2013  . Normocytic anemia 06/11/2013  . Leg DVT (deep venous thromboembolism), chronic ([place]) 06/10/2013  . Physical deconditioning 05/30/2013  . BPH (benign prostatic hyperplasia) 04/24/2013  . Preoperative examination 04/24/2013  . Primary hyperparathyroidism ([place]) 09/02/2012  . Health care maintenance 08/30/2012  . Frequent falls 06/23/2012  . Thrombotic stroke involving right cerebellar artery ([place]) 09/15/2011  . 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 Hearing loss 07/20/2011  . Meningioma ([place]) 07/20/2011  . HTN (hypertension) 07/19/2011  . CVA (cerebral infarction) 07/19/2011    Past Surgical History:  Procedure Laterality Date  . CYSTOSCOPY N/A 06/13/2013   Procedure: CYSTOSCOPY FLEXIBLE BEDSIDE;  Surgeon: Ardis Hughs, MD;  Location: [facility];  Service: Urology;  Laterality: N/A;  . MYRINGOTOMY WITH TUBE PLACEMENT Bilateral   . TRANSURETHRAL RESECTION OF PROSTATE N/A 05/19/2013   Procedure: TRANSURETHRAL RESECTION OF THE PROSTATE WITH GYRUS INSTRUMENTS;  Surgeon: Ailene Rud, MD;  Location: [facility];  Service: Urology;  Laterality: N/A;       Home Medications    Prior to Admission  medications   Medication Sig Start Date End Date Taking? Authorizing Provider  amLODipine (NORVASC) 5 MG tablet Take 1 tablet (5 mg total) by mouth daily. 04/27/16   [name], DO  atorvastatin (LIPITOR) 20 MG tablet Take 1 tablet (20 mg total) by mouth daily at 6 PM. 04/27/16   Heber St. Landry, Rachel Moulds, DO  irbesartan (AVAPRO) 300 MG tablet Take 1 tablet (300 mg total) by mouth daily. 02/05/17   [name], DO  metoprolol  tartrate (LOPRESSOR) 25 MG tablet Take 1 tablet (25 mg total) by mouth 2 (two) times daily. 04/27/16   [name], DO  rivaroxaban (XARELTO) 10 MG TABS tablet Take 1 tablet (10 mg total) by mouth daily. PHARMACIST VOID XARELTO 15 MG PRESCRIPTION 11/14/16   [name], DO  sulfamethoxazole-trimethoprim (BACTRIM,SEPTRA) 400-80 MG tablet Take 1 tablet by mouth 2 (two) times daily for 7 days. 02/16/17 02/23/17  Thomasene Ripple, MD    Family History Family History  Problem Relation Age of Onset  . Diabetes Mother   . Heart disease Mother   . Hypertension Mother   . Heart disease Father   . Hypertension Father   . Hypertension Daughter     Social History Social History   Tobacco Use  . Smoking status: Never Smoker  . Smokeless tobacco: Never Used  Substance Use Topics  . Alcohol use: No    Alcohol/week: 0.0 oz  . Drug use: No     Allergies   Patient has no known allergies.   Review of Systems Review of Systems  All other systems reviewed and are negative.    Physical Exam Updated Vital Signs BP (!) 154/101   Pulse 66   Temp 98.1 F (36.7 C) (Oral)   Resp (!) 22   Ht 5\' 9"  (1.753 m)   Wt 87.1 kg (192 lb)   SpO2 95%   BMI 28.35 kg/m   Physical Exam  Constitutional: He is oriented to person, place, and time. He appears well-developed and well-nourished.  HENT:  Head: Normocephalic and atraumatic.  Eyes: EOM are normal. Pupils are equal, round, and reactive to light.  Neck: Normal range of motion.  Cardiovascular: Normal rate, regular rhythm and intact distal pulses.  Pulmonary/Chest: Effort normal and breath sounds normal. No respiratory distress.  Abdominal: Soft. He exhibits no distension. There is no tenderness.  Musculoskeletal: Normal range of motion.  Neurological: He is alert and oriented to person, place, and time.  5/5 strength in major muscle groups of  bilateral upper and lower extremities. Speech normal. No facial asymetry.   Skin: Skin is  warm and dry.  Nursing note and vitals reviewed.    ED Treatments / Results  Labs (all labs ordered are listed, but only abnormal results are displayed) Labs Reviewed  URINE CULTURE - Abnormal; Notable for the following components:      Result Value        All other components within normal limits  PROTIME-INR - Abnormal; Notable for the following components:   Prothrombin Time 17.5 (*)    All other components within normal limits  APTT - Abnormal; Notable for the following components:   aPTT 41 (*)    All other components within normal limits  CBC - Abnormal; Notable for the following components:   RBC 4.03 (*)    Hemoglobin 11.8 (*)    HCT 37.1 (*)    All other components within normal limits  COMPREHENSIVE METABOLIC PANEL - Abnormal; Notable for the  following components:   Glucose, Bld 127 (*)    Albumin 3.3 (*)    ALT 11 (*)    GFR calc non Af Amer 49 (*)    GFR calc Af Amer 57 (*)    All other components within normal limits  URINALYSIS, ROUTINE W REFLEX MICROSCOPIC - Abnormal; Notable for the following components:   APPearance CLOUDY (*)    Hgb urine dipstick MODERATE (*)    Protein, ur 30 (*)    Leukocytes, UA LARGE (*)    Bacteria, UA RARE (*)    Squamous Epithelial / LPF 0-5 (*)    All other components within normal limits  I-STAT CHEM 8, ED - Abnormal; Notable for the following components:   Glucose, Bld 125 (*)    Hemoglobin 12.6 (*)    HCT 37.0 (*)    All other components within normal limits  DIFFERENTIAL  I-STAT TROPONIN, ED    EKG  EKG Interpretation  Date/Time:  Wednesday February 07 2017 00:23:40 EST Ventricular Rate:  67 PR Interval:  198 QRS Duration: 110 QT Interval:  392 QTC Calculation: 414 R Axis:   -20 Text Interpretation:  Sinus rhythm with occasional Premature ventricular complexes Left ventricular hypertrophy Cannot rule out Septal infarct , age undetermined Abnormal ECG T wave inversion Inferior leads New since previous tracing  Confirmed by Orlie Dakin [phone]) on 02/08/2017 1:30:42 PM       Radiology No results found.  Procedures Procedures (including critical care time)  Medications Ordered in ED Medications - No data to display   Initial Impression / Assessment and Plan / ED Course  I have reviewed the triage vital signs and the nursing notes.  Pertinent labs & imaging results that were available during my care of the patient were reviewed by me and considered in my medical decision making (see chart for details).    CT head without acute pathology.  Overall well-appearing.  Nonfocal neurologic exam.  I do not think he needs admission the hospital at this time.  Close outpatient neurology follow-up.  Patient understands return to the ER for new or worsening symptoms   Final Clinical Impressions(s) / ED Diagnoses   Final diagnoses:  Weakness    ED Discharge Orders    None       Jola Schmidt, MD 02/17/17 2351

## 2017-02-19 ENCOUNTER — Other Ambulatory Visit: Payer: Self-pay

## 2017-02-19 ENCOUNTER — Encounter (HOSPITAL_COMMUNITY): Payer: Self-pay | Admitting: Oncology

## 2017-02-19 ENCOUNTER — Inpatient Hospital Stay (HOSPITAL_COMMUNITY): Payer: Medicare Other

## 2017-02-19 ENCOUNTER — Emergency Department (HOSPITAL_COMMUNITY): Payer: Medicare Other

## 2017-02-19 ENCOUNTER — Inpatient Hospital Stay (HOSPITAL_COMMUNITY)
Admission: EM | Admit: 2017-02-19 | Discharge: 2017-02-21 | DRG: 194 | Disposition: A | Discharge status: Skilled Nursing Facility | Payer: Medicare Other | Attending: Internal Medicine | Admitting: Internal Medicine

## 2017-02-19 DIAGNOSIS — R531 Weakness: Secondary | ICD-10-CM

## 2017-02-19 DIAGNOSIS — R296 Repeated falls: Secondary | ICD-10-CM

## 2017-02-19 DIAGNOSIS — N401 Enlarged prostate with lower urinary tract symptoms: Secondary | ICD-10-CM | POA: Diagnosis present

## 2017-02-19 DIAGNOSIS — Z993 Dependence on wheelchair: Secondary | ICD-10-CM

## 2017-02-19 DIAGNOSIS — N39498 Other specified urinary incontinence: Secondary | ICD-10-CM

## 2017-02-19 DIAGNOSIS — Z8673 Personal history of transient ischemic attack (TIA), and cerebral infarction without residual deficits: Secondary | ICD-10-CM | POA: Diagnosis not present

## 2017-02-19 DIAGNOSIS — Z95828 Presence of other vascular implants and grafts: Secondary | ICD-10-CM | POA: Diagnosis not present

## 2017-02-19 DIAGNOSIS — R488 Other symbolic dysfunctions: Secondary | ICD-10-CM | POA: Diagnosis not present

## 2017-02-19 DIAGNOSIS — Z960 Presence of urogenital implants: Secondary | ICD-10-CM

## 2017-02-19 DIAGNOSIS — Z8249 Family history of ischemic heart disease and other diseases of the circulatory system: Secondary | ICD-10-CM

## 2017-02-19 DIAGNOSIS — R339 Retention of urine, unspecified: Secondary | ICD-10-CM | POA: Diagnosis not present

## 2017-02-19 DIAGNOSIS — Z86718 Personal history of other venous thrombosis and embolism: Secondary | ICD-10-CM

## 2017-02-19 DIAGNOSIS — D329 Benign neoplasm of meninges, unspecified: Secondary | ICD-10-CM | POA: Diagnosis present

## 2017-02-19 DIAGNOSIS — R32 Unspecified urinary incontinence: Secondary | ICD-10-CM | POA: Diagnosis present

## 2017-02-19 DIAGNOSIS — H9193 Unspecified hearing loss, bilateral: Secondary | ICD-10-CM | POA: Diagnosis present

## 2017-02-19 DIAGNOSIS — R338 Other retention of urine: Secondary | ICD-10-CM

## 2017-02-19 DIAGNOSIS — J181 Lobar pneumonia, unspecified organism: Secondary | ICD-10-CM

## 2017-02-19 DIAGNOSIS — I10 Essential (primary) hypertension: Secondary | ICD-10-CM | POA: Diagnosis not present

## 2017-02-19 DIAGNOSIS — R509 Fever, unspecified: Secondary | ICD-10-CM

## 2017-02-19 DIAGNOSIS — I129 Hypertensive chronic kidney disease with stage 1 through stage 4 chronic kidney disease, or unspecified chronic kidney disease: Secondary | ICD-10-CM | POA: Diagnosis present

## 2017-02-19 DIAGNOSIS — J189 Pneumonia, unspecified organism: Principal | ICD-10-CM

## 2017-02-19 DIAGNOSIS — N390 Urinary tract infection, site not specified: Secondary | ICD-10-CM | POA: Diagnosis present

## 2017-02-19 DIAGNOSIS — Z23 Encounter for immunization: Secondary | ICD-10-CM | POA: Diagnosis present

## 2017-02-19 DIAGNOSIS — Z87440 Personal history of urinary (tract) infections: Secondary | ICD-10-CM

## 2017-02-19 DIAGNOSIS — Z7901 Long term (current) use of anticoagulants: Secondary | ICD-10-CM | POA: Diagnosis not present

## 2017-02-19 DIAGNOSIS — R2689 Other abnormalities of gait and mobility: Secondary | ICD-10-CM | POA: Diagnosis not present

## 2017-02-19 DIAGNOSIS — R918 Other nonspecific abnormal finding of lung field: Secondary | ICD-10-CM | POA: Diagnosis not present

## 2017-02-19 DIAGNOSIS — E785 Hyperlipidemia, unspecified: Secondary | ICD-10-CM | POA: Diagnosis present

## 2017-02-19 DIAGNOSIS — N182 Chronic kidney disease, stage 2 (mild): Secondary | ICD-10-CM | POA: Diagnosis present

## 2017-02-19 DIAGNOSIS — Z79899 Other long term (current) drug therapy: Secondary | ICD-10-CM | POA: Diagnosis not present

## 2017-02-19 DIAGNOSIS — H9190 Unspecified hearing loss, unspecified ear: Secondary | ICD-10-CM | POA: Diagnosis not present

## 2017-02-19 DIAGNOSIS — Z974 Presence of external hearing-aid: Secondary | ICD-10-CM | POA: Diagnosis not present

## 2017-02-19 DIAGNOSIS — M6281 Muscle weakness (generalized): Secondary | ICD-10-CM | POA: Diagnosis not present

## 2017-02-19 DIAGNOSIS — R42 Dizziness and giddiness: Secondary | ICD-10-CM | POA: Diagnosis not present

## 2017-02-19 DIAGNOSIS — R404 Transient alteration of awareness: Secondary | ICD-10-CM | POA: Diagnosis not present

## 2017-02-19 DIAGNOSIS — J80 Acute respiratory distress syndrome: Secondary | ICD-10-CM | POA: Diagnosis not present

## 2017-02-19 LAB — COMPREHENSIVE METABOLIC PANEL
ALT: 12 U/L — ABNORMAL LOW (ref 17–63)
AST: 22 U/L (ref 15–41)
Albumin: 3.2 g/dL — ABNORMAL LOW (ref 3.5–5.0)
Alkaline Phosphatase: 53 U/L (ref 38–126)
Anion gap: 8 (ref 5–15)
BUN: 16 mg/dL (ref 6–20)
CO2: 22 mmol/L (ref 22–32)
Calcium: 9.5 mg/dL (ref 8.9–10.3)
Chloride: 105 mmol/L (ref 101–111)
Creatinine, Ser: 1.33 mg/dL — ABNORMAL HIGH (ref 0.61–1.24)
GFR calc Af Amer: 53 mL/min — ABNORMAL LOW (ref >60)
GFR calc non Af Amer: 45 mL/min — ABNORMAL LOW (ref >60)
Glucose, Bld: 112 mg/dL — ABNORMAL HIGH (ref 65–99)
Potassium: 3.9 mmol/L (ref 3.5–5.1)
Sodium: 135 mmol/L (ref 135–145)
Total Bilirubin: 0.5 mg/dL (ref 0.3–1.2)
Total Protein: 6.9 g/dL (ref 6.5–8.1)

## 2017-02-19 LAB — URINALYSIS, ROUTINE W REFLEX MICROSCOPIC
Bacteria, UA: NONE SEEN
Bilirubin Urine: NEGATIVE
Glucose, UA: NEGATIVE mg/dL
Ketones, ur: NEGATIVE mg/dL
Nitrite: NEGATIVE
Protein, ur: NEGATIVE mg/dL
Specific Gravity, Urine: 1.014 (ref 1.005–1.030)
Squamous Epithelial / LPF: NONE SEEN
pH: 6 (ref 5.0–8.0)

## 2017-02-19 LAB — CBC WITH DIFFERENTIAL/PLATELET
Basophils Absolute: 0 10*3/uL (ref 0.0–0.1)
Basophils Relative: 0 %
Eosinophils Absolute: 0.1 10*3/uL (ref 0.0–0.7)
Eosinophils Relative: 1 %
HCT: 31.8 % — ABNORMAL LOW (ref 39.0–52.0)
Hemoglobin: 10.2 g/dL — ABNORMAL LOW (ref 13.0–17.0)
Lymphocytes Relative: 5 %
Lymphs Abs: 0.6 10*3/uL — ABNORMAL LOW (ref 0.7–4.0)
MCH: 28.6 pg (ref 26.0–34.0)
MCHC: 32.1 g/dL (ref 30.0–36.0)
MCV: 89.1 fL (ref 78.0–100.0)
Monocytes Absolute: 1 10*3/uL (ref 0.1–1.0)
Monocytes Relative: 9 %
Neutro Abs: 9.6 10*3/uL — ABNORMAL HIGH (ref 1.7–7.7)
Neutrophils Relative %: 85 %
Platelets: 177 10*3/uL (ref 150–400)
RBC: 3.57 MIL/uL — ABNORMAL LOW (ref 4.22–5.81)
RDW: 14.4 % (ref 11.5–15.5)
WBC: 11.4 10*3/uL — ABNORMAL HIGH (ref 4.0–10.5)

## 2017-02-19 LAB — I-STAT CG4 LACTIC ACID, ED: Lactic Acid, Venous: 1.51 mmol/L (ref 0.5–1.9)

## 2017-02-19 LAB — PROCALCITONIN: Procalcitonin: 0.27 ng/mL

## 2017-02-19 LAB — I-STAT TROPONIN, ED: Troponin i, poc: 0.02 ng/mL (ref 0.00–0.08)

## 2017-02-19 MED ORDER — DEXTROSE 5 % IV SOLN
1.0000 g | Freq: Once | INTRAVENOUS | Status: AC
  Administered 2017-02-19: 1 g via INTRAVENOUS
  Filled 2017-02-19: qty 10

## 2017-02-19 MED ORDER — IRBESARTAN 300 MG PO TABS
300.0000 mg | ORAL_TABLET | Freq: Every day | ORAL | Status: DC
  Administered 2017-02-19 – 2017-02-21 (×3): 300 mg via ORAL
  Filled 2017-02-19 (×3): qty 1

## 2017-02-19 MED ORDER — INFLUENZA VAC SPLIT HIGH-DOSE 0.5 ML IM SUSY
0.5000 mL | PREFILLED_SYRINGE | INTRAMUSCULAR | Status: AC
Start: 2017-02-20 — End: 2017-02-20
  Administered 2017-02-20: 0.5 mL via INTRAMUSCULAR
  Filled 2017-02-19: qty 0.5

## 2017-02-19 MED ORDER — ATORVASTATIN CALCIUM 20 MG PO TABS
20.0000 mg | ORAL_TABLET | Freq: Every day | ORAL | Status: DC
  Administered 2017-02-19 – 2017-02-20 (×2): 20 mg via ORAL
  Filled 2017-02-19 (×3): qty 1

## 2017-02-19 MED ORDER — AZITHROMYCIN 500 MG PO TABS
250.0000 mg | ORAL_TABLET | Freq: Every day | ORAL | Status: DC
  Administered 2017-02-19 – 2017-02-21 (×3): 250 mg via ORAL
  Filled 2017-02-19 (×3): qty 1

## 2017-02-19 MED ORDER — SODIUM CHLORIDE 0.9 % IV BOLUS (SEPSIS)
1000.0000 mL | Freq: Once | INTRAVENOUS | Status: AC
  Administered 2017-02-19: 1000 mL via INTRAVENOUS

## 2017-02-19 MED ORDER — ACETAMINOPHEN 500 MG PO TABS
500.0000 mg | ORAL_TABLET | Freq: Once | ORAL | Status: AC
  Administered 2017-02-19: 500 mg via ORAL
  Filled 2017-02-19: qty 1

## 2017-02-19 MED ORDER — RIVAROXABAN 10 MG PO TABS
10.0000 mg | ORAL_TABLET | Freq: Every day | ORAL | Status: DC
  Administered 2017-02-19 – 2017-02-21 (×3): 10 mg via ORAL
  Filled 2017-02-19 (×3): qty 1

## 2017-02-19 MED ORDER — METOPROLOL TARTRATE 25 MG PO TABS
25.0000 mg | ORAL_TABLET | Freq: Two times a day (BID) | ORAL | Status: DC
  Administered 2017-02-19 – 2017-02-21 (×5): 25 mg via ORAL
  Filled 2017-02-19 (×5): qty 1

## 2017-02-19 MED ORDER — DEXTROSE 5 % IV SOLN
1.0000 g | INTRAVENOUS | Status: DC
  Administered 2017-02-20 – 2017-02-21 (×2): 1 g via INTRAVENOUS
  Filled 2017-02-19 (×2): qty 10

## 2017-02-19 MED ORDER — AMLODIPINE BESYLATE 5 MG PO TABS
5.0000 mg | ORAL_TABLET | Freq: Every day | ORAL | Status: DC
  Administered 2017-02-19 – 2017-02-21 (×3): 5 mg via ORAL
  Filled 2017-02-19 (×3): qty 1

## 2017-02-19 MED ORDER — DEXTROSE 5 % IV SOLN
500.0000 mg | Freq: Once | INTRAVENOUS | Status: AC
  Administered 2017-02-19: 500 mg via INTRAVENOUS
  Filled 2017-02-19: qty 500

## 2017-02-19 MED ORDER — ACETAMINOPHEN 650 MG RE SUPP: 650.0000 mg | Freq: Four times a day (QID) | RECTAL | Status: DC | PRN

## 2017-02-19 MED ORDER — ACETAMINOPHEN 325 MG PO TABS
650.0000 mg | ORAL_TABLET | Freq: Four times a day (QID) | ORAL | Status: DC | PRN
  Administered 2017-02-19: 650 mg via ORAL
  Filled 2017-02-19: qty 2

## 2017-02-19 NOTE — ED Notes (Signed)
Delay in 2nd set of blood cultures,   Pt currently in xray.

## 2017-02-19 NOTE — ED Notes (Signed)
Placed pt on [place] at 2Lpm d/t decr'd SpO2 in the low 90s.

## 2017-02-19 NOTE — ED Notes (Signed)
Temp taken at 15:26 was 99.12F.

## 2017-02-19 NOTE — H&P (Signed)
Date: 02/19/2017               Patient Name:  [name] MRN: [id number]  DOB: [birth year] Age / Sex: 83 y.o., male   PCP: [name], DO         Medical Service: Internal Medicine Teaching Service         Attending Physician: Dr. Deno Etienne, DO    First Contact: Dr. Berline Lopes Pager: [phone]  Second Contact: Dr. Heber Dateland Pager: [phone]       After Hours (After 5p/  First Contact Pager: [phone]  weekends / holidays): Second Contact Pager: [phone]   Chief Complaint: Weakness  History of Present Illness: Mr. Clauss is a 83 yo M with a past medical history of HTN, HLD, falls, urinary incontinence and UTIs, urinary retention, DVT, CVA, who presents to the ED with complaints of generalized weakness. The majority of the history was obtained from the daughter.   They report around 2-3 weeks of generalized weakness, dizziness which has lead to balance issues with standing or walking. At baseline, the pt is primarily in a wheelchair, can stand with a walker and walk with assistance. Daughter provides assistance with ADLs. He has a history of UTIs which presented with similar sx. He has been evaluated and treated for UTI, initially on 11/14 with Keflex, and subsequently on 12/4 with bactrim after urine cx revealed >100k CFUs of Proteus. He did have an indwelling [place] urinary catheter placed 11/9 for incontinence and retention which was then removed 11/30. His daughter reports that she has not noticed improvement despite these abx treatments. He was also evaluated 11/28 for weakness, along with n/v which has since resolved. Does note recent non-productive cough for several days. Denies urinary sx, fever at home.      In the ED, febrile to 101.2, HR 82, BP 170/84, 94% on RA. Lbas showed WBC 11.4, Hgb 10.2. U/A with small leukocytes, negative nitrites, no bacteria on micro. CXR showed mild L basilar opacity. He received 1 L NS, azithromycin and ceftriaxone. He was admitted for further  management.   Meds:  Current Meds  Medication Sig  . amLODipine (NORVASC) 5 MG tablet Take 1 tablet (5 mg total) by mouth daily.  Marland Kitchen atorvastatin (LIPITOR) 20 MG tablet Take 1 tablet (20 mg total) by mouth daily at 6 PM.  . irbesartan (AVAPRO) 300 MG tablet Take 1 tablet (300 mg total) by mouth daily.  . metoprolol tartrate (LOPRESSOR) 25 MG tablet Take 1 tablet (25 mg total) by mouth 2 (two) times daily.  . rivaroxaban (XARELTO) 10 MG TABS tablet Take 1 tablet (10 mg total) by mouth daily. PHARMACIST VOID XARELTO 15 MG PRESCRIPTION  . sulfamethoxazole-trimethoprim (BACTRIM,SEPTRA) 400-80 MG tablet Take 1 tablet by mouth 2 (two) times daily for 7 days.     Allergies: Allergies as of 02/19/2017  . (No Known Allergies)   Past Medical History:  Diagnosis Date  . Anemia   . BPH (benign prostatic hypertrophy)    TURP 05/19/13  . Chronic kidney disease    CHRONIC KIDNEY DISEASE, 2  . Difficulty hearing, right    BILATERAL HEARING LOSS - BEST TO TRY TO SPEAK INTO LEFT EAR  . DVT (deep venous thrombosis) ([place])   . Frequent falls   . Hyperlipidemia   . Hypertension   . Incontinence of urine    SOME INCONTINENCE  . Meningioma ([place])   . Stroke [facility])    Cerebellar, 2013; WALKS WITH WALKER,  ABLE TO DRESS AND BATHE HIMSELF BUT FAMILY TRIES TO PROVIDE SUPERVISION BECAUSE OF HIS HX OF FALL AND WEAKNESS LEGS, ARMS   . Thrombocytopenia ([place])     Family History:  Family History  Problem Relation Age of Onset  . Diabetes Mother   . Heart disease Mother   . Hypertension Mother   . Heart disease Father   . Hypertension Father   . Hypertension Daughter      Social History:  Social History   Tobacco Use  . Smoking status: Never Smoker  . Smokeless tobacco: Never Used  Substance Use Topics  . Alcohol use: No    Alcohol/week: 0.0 oz  . Drug use: No     Review of Systems: A complete ROS was unable to be obtained due to patient mental status.   Physical Exam: Blood pressure (!)  170/89, pulse 82, temperature (!) 101.2 F (38.4 C), temperature source Oral, resp. rate 15, SpO2 94 %. General: Elderly male resting in bed comfortably, no acute distress Head: Normocephalic, atraumatic Eyes: EOMI, PERRL ENT: Moist mucus membranes, dentures  CV: Regular rate and rhythm, no murmur appreciated  Resp: Clear breath sounds bilaterally, normal work of breathing, no distress  Abd: Soft, +BS, Non-tender to palpation  Extr: Minimal LE edema  Neuro: Alert and oriented to person, place, and month (not year/day), 5/5 bilateral UE strength, 4/5 bilateral LE strength, inaccuracy with finger to nose bilaterally with no dysmetria  Skin: Warm, dry, patchy hypopigmentation to LEs       EKG: personally reviewed my interpretation is artifact, sinus rhythm though with irregularity, no ischemic changes.   CXR: personally reviewed my interpretation is no effusions, no significant opacity/consolidation, question of L basilar atelectasis vs developing infection.   Assessment & Plan by Problem:  Fever, Generalized weakness, H/o Recurrent UTIs He has a history of recurrent UTIs and was recently prescribed bactrim for treatment of Proteus on urine culture from 12/4. During prior UTIs, he was noted to have gait instability/weakness while denying urinary sx, though he has not clinically improved as expected despite U/A appearing improved. His CXR on admission notes a potential area of infection to L base and has received empiric CAP coverage- which would also cover any residual UTI. Blood cultures collected in the ED.  --Tele, monitor vital signs, fever curve, Tylenol prn    --Cont Ceftriaxone  --Procalcitonin --F/u blood cx, urine cx   --CBC --PT eval   Urinary Retention, Incontinence  He has a history of urinary retention due to BPH, s/p TURP. He is also incontinent with enuresis, wears diapers at home. He recently had a trial of an indwelling catheter which was removed on 11/30 due to pt  discomfort and not tolerating well, did have above sx with catheter. Daughter reports he is wetting diapers regularly, though may be overflow incontinence and some degree of retention not excluded. His retention and incontinence are likely contributing to his infection risk.  --Bladder scan    H/o CVA  The pt has a history of R cerebellar artery CVA. On recent clinic evaluation noted to have nystagmus, along with complaints of dizziness and gait instability. MRI ordered from the clinic has not yet been performed. He does not currently exhibit nystagmus and neuro exam is non-focal. Will order MRI to facilitate prior workup, though feel an acute process at this point is unlikely.   --MRI Brain   H/o Hypertension BP elevated to 170 on arrival. --Cont home amlodipine, irbesartan, metoprolol    H/o  DVT  --Cont home Xarelto 10 mg daily   Dispo: Admit patient to Inpatient with expected length of stay greater than 2 midnights.  Signed: Tawny Asal, MD 02/19/2017, 4:28 AM  Pager: [phone]

## 2017-02-19 NOTE — Evaluation (Signed)
Physical Therapy Evaluation Patient Details Name: [name] MRN: [id number] DOB: Oct 30, 1924 Today's Date: 02/19/2017   History of Present Illness  Pt is a 83 y/o male admitted secondary to generalized weakness. PMH includes dementia, hearing loss, DVT, CVA with R sided weakness, meningioma, CKD, and meningioma. MRI negative for acute abnormality and revealed stable L sphenoid wing meningioma.   Clinical Impression  Pt admitted secondary to problem above with deficits below. PLOF information obtained from chart, as pt family unavailable. Per chart, pt used WC for primary mobility, however, daughter assisted with ambulation with RW. Upon eval, pt limited by weakness and decreased balance. Only safe to attempt sit<>Stand at EOB as pt with heavy posterior lean. Required max A for mobility this session. Feel pt would benefit from SNF at d/c to increase independence and safety with functional mobility and transfers. Will continue to follow acutely to progress mobility to ensure safety with mobility.     Follow Up Recommendations SNF;Supervision/Assistance - 24 hour    Equipment Recommendations       Recommendations for Other Services       Precautions / Restrictions Precautions Precautions: Fall Restrictions Weight Bearing Restrictions: No      Mobility  Bed Mobility Overal bed mobility: Needs Assistance Bed Mobility: Supine to Sit;Sit to Supine     Supine to sit: Max assist Sit to supine: Mod assist   General bed mobility comments: Max a for assist with scooting hips to EOB and for trunk elevation. Cues for sequencing required. Assist required for LE lift assist to return to supine.   Transfers Overall transfer level: Needs assistance Equipment used: 1 person hand held assist Transfers: Sit to/from Stand Sit to Stand: Max assist         General transfer comment: Heavy max A to stand. Pt with heavy posterior lean, so unable to achieve full standing. Required manual  blocking of feet to keep feet in place.   Ambulation/Gait             General Gait Details: Unsafe to attempt at this time.   Stairs            Wheelchair Mobility    Modified Rankin (Stroke Patients Only)       Balance Overall balance assessment: Needs assistance Sitting-balance support: Bilateral upper extremity supported;Feet supported Sitting balance-Leahy Scale: Poor Sitting balance - Comments: Reliant on UE support to maintain sitting balance.  Postural control: Posterior lean Standing balance support: Single extremity supported Standing balance-Leahy Scale: Poor Standing balance comment: Reliant on UE and external support to stand.                              Pertinent Vitals/Pain Pain Assessment: No/denies pain    Home Living Family/patient expects to be discharged to:: Private residence Living Arrangements: Children Available Help at Discharge: Family Type of Home: House Home Access: Ramped entrance     [place]: One level Home Equipment: Environmental consultant - 2 wheels;Wheelchair - manual Additional Comments: Some information from previous encounter. Unsure of assist provided at home as pt with difficulties with communication and hx of dementia.     Prior Function Level of Independence: Needs assistance   Gait / Transfers Assistance Needed: Per chart, reports daughter assisted with short ambulation distance with RW, however, mainly used WC. Unsure of assist that needed to be provided for transfers.   ADL's / Homemaking Assistance Needed: Unsure of assist required for ADLs.  Comments: No family present to confirm PLOF.      Hand Dominance        Extremity/Trunk Assessment   Upper Extremity Assessment Upper Extremity Assessment: Generalized weakness    Lower Extremity Assessment Lower Extremity Assessment: Generalized weakness    Cervical / Trunk Assessment Cervical / Trunk Assessment: Kyphotic  Communication   Communication:  HOH;Other (comment)(slurred speech )  Cognition Arousal/Alertness: Awake/alert Behavior During Therapy: WFL for tasks assessed/performed Overall Cognitive Status: History of cognitive impairments - at baseline                                 General Comments: Per chart, history of dementia.       General Comments General comments (skin integrity, edema, etc.): No family present during session.     Exercises     Assessment/Plan    PT Assessment Patient needs continued PT services  PT Problem List Decreased strength;Decreased balance;Decreased mobility;Decreased cognition;Decreased knowledge of use of DME       PT Treatment Interventions DME instruction;Gait training;Stair training;Functional mobility training;Therapeutic activities;Therapeutic exercise;Balance training;Neuromuscular re-education;Patient/family education;Wheelchair mobility training    PT Goals (Current goals can be found in the Care Plan section)  Acute Rehab PT Goals Patient Stated Goal: none stated  PT Goal Formulation: With patient Time For Goal Achievement: 03/05/17 Potential to Achieve Goals: Fair    Frequency Min 2X/week   Barriers to discharge        Co-evaluation               AM-PAC PT "6 Clicks" Daily Activity  Outcome Measure Difficulty turning over in bed (including adjusting bedclothes, sheets and blankets)?: A Lot Difficulty moving from lying on back to sitting on the side of the bed? : Unable Difficulty sitting down on and standing up from a chair with arms (e.g., wheelchair, bedside commode, etc,.)?: Unable Help needed moving to and from a bed to chair (including a wheelchair)?: Total Help needed walking in hospital room?: Total Help needed climbing 3-5 steps with a railing? : Total 6 Click Score: 7    End of Session Equipment Utilized During Treatment: Gait belt Activity Tolerance: Patient tolerated treatment well Patient left: in bed;with call bell/phone within  reach Nurse Communication: Mobility status PT Visit Diagnosis: Unsteadiness on feet (R26.81);Muscle weakness (generalized) (M62.81)    Time: 1316-1330 PT Time Calculation (min) (ACUTE ONLY): 14 min   Charges:   PT Evaluation $PT Eval Moderate Complexity: 1 Mod     PT G Codes:        Leighton Ruff, PT, DPT  Acute Rehabilitation Services  Pager: [phone]   Rudean Hitt 02/19/2017, 1:47 PM

## 2017-02-19 NOTE — ED Triage Notes (Signed)
Pt bib [facility] from home d/t weakness.  Pt dx w/ a UTI on Friday, has taken 3 of his antibiotics.  Pt denies c/o to EMS.  Stroke scale negative in the field.  Pt is A&O x 4.  Pt is HOH.  Per EMS, pt's daughter stated pt got a, "Little wobbly" today prompting her to call EMS.

## 2017-02-19 NOTE — ED Notes (Signed)
Patient transported to MRI 

## 2017-02-19 NOTE — ED Provider Notes (Signed)
[facility] EMERGENCY DEPARTMENT Provider Note   CSN: [id number] Arrival date & time: 02/19/17  0111     History   Chief Complaint Chief Complaint  Patient presents with  . Weakness    HPI [name] is a 83 y.o. male.  HPI   Level V caveat due to confusion.  [name] is a 83 y.o. male, with a history of hearing loss, DVT, HTN, urinary incontinence, cerebellar stroke, and thrombocytopenia, presenting to the ED with reported generalized weakness.  Patient presents alone via EMS. Per EMS, patient's daughter called EMS due to the patient becoming "a little wobbly" today. Patient mumbles incoherent response to all questions.       Past Medical History:  Diagnosis Date  . Anemia   . BPH (benign prostatic hypertrophy)    TURP 05/19/13  . Chronic kidney disease    CHRONIC KIDNEY DISEASE, 2  . Difficulty hearing, right    BILATERAL HEARING LOSS - BEST TO TRY TO SPEAK INTO LEFT EAR  . DVT (deep venous thrombosis) ([place])   . Frequent falls   . Hyperlipidemia   . Hypertension   . Incontinence of urine    SOME INCONTINENCE  . Meningioma ([place])   . Stroke ([place])    Cerebellar, 2013; WALKS WITH WALKER, ABLE TO DRESS AND BATHE HIMSELF BUT FAMILY TRIES TO PROVIDE SUPERVISION BECAUSE OF HIS HX OF FALL AND WEAKNESS LEGS, ARMS   . Thrombocytopenia [facility])     Patient Active Problem List   Diagnosis Date Noted  . Cerebellar ataxia in diseases classified elsewhere ([place]) 02/13/2017  . Social problem 11/07/2016  . Left ankle swelling 08/03/2016  . Dementia 01/28/2016  . Gait instability 01/26/2016  . Hypernatremia   . Hematuria 02/26/2015  . Weakness 02/26/2015  . UTI (urinary tract infection) 07/17/2014  . Presence of IVC filter 07/17/2014  . Right knee pain 05/25/2014  . Long term current use of anticoagulant therapy- Xarelto 03/26/2014  . Onychomycosis 01/15/2014  . Vitreous hemorrhage ([place]) 12/21/2013  . Cerebral embolism with cerebral  infarction ([place]) 12/19/2013  . Gait abnormality 12/18/2013  . Left adrenal mass ([place]) 09/11/2013  . Orthostatic hypotension 07/10/2013  . CKD (chronic kidney disease) stage 3, GFR 30-59 ml/min ([facility]) 06/23/2013  . Hyperlipidemia   . Urinary retention 06/15/2013  . Normocytic anemia 06/11/2013  . Leg DVT (deep venous thromboembolism), chronic ([place]) 06/10/2013  . Physical deconditioning 05/30/2013  . BPH (benign prostatic hyperplasia) 04/24/2013  . Preoperative examination 04/24/2013  . Primary hyperparathyroidism ([place]) 09/02/2012  . Health care maintenance 08/30/2012  . Frequent falls 06/23/2012  . Thrombotic stroke involving right cerebellar artery ([place]) 09/15/2011  . 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 Hearing loss 07/20/2011  . Meningioma ([place]) 07/20/2011  . HTN (hypertension) 07/19/2011  . CVA (cerebral infarction) 07/19/2011    Past Surgical History:  Procedure Laterality Date  . CYSTOSCOPY N/A 06/13/2013   Procedure: CYSTOSCOPY FLEXIBLE BEDSIDE;  Surgeon: Ardis Hughs, MD;  Location: [facility];  Service: Urology;  Laterality: N/A;  . MYRINGOTOMY WITH TUBE PLACEMENT Bilateral   . TRANSURETHRAL RESECTION OF PROSTATE N/A 05/19/2013   Procedure: TRANSURETHRAL RESECTION OF THE PROSTATE WITH GYRUS INSTRUMENTS;  Surgeon: Ailene Rud, MD;  Location: [facility];  Service: Urology;  Laterality: N/A;       Home Medications    Prior to Admission medications   Medication Sig Start Date End Date Taking? Authorizing Provider  amLODipine (NORVASC) 5 MG tablet Take 1  tablet (5 mg total) by mouth daily. 04/27/16  Yes [name], DO  atorvastatin (LIPITOR) 20 MG tablet Take 1 tablet (20 mg total) by mouth daily at 6 PM. 04/27/16  Yes Hoffman, Rachel Moulds, DO  irbesartan (AVAPRO) 300 MG tablet Take 1 tablet (300 mg total) by mouth daily. 02/05/17  Yes [name], DO  metoprolol tartrate (LOPRESSOR) 25 MG tablet Take 1 tablet (25 mg total) by mouth 2 (two)  times daily. 04/27/16  Yes [name], DO  rivaroxaban (XARELTO) 10 MG TABS tablet Take 1 tablet (10 mg total) by mouth daily. PHARMACIST VOID XARELTO 15 MG PRESCRIPTION 11/14/16  Yes [name], DO  sulfamethoxazole-trimethoprim (BACTRIM,SEPTRA) 400-80 MG tablet Take 1 tablet by mouth 2 (two) times daily for 7 days. 02/16/17 02/23/17 Yes Thomasene Ripple, MD    Family History Family History  Problem Relation Age of Onset  . Diabetes Mother   . Heart disease Mother   . Hypertension Mother   . Heart disease Father   . Hypertension Father   . Hypertension Daughter     Social History Social History   Tobacco Use  . Smoking status: Never Smoker  . Smokeless tobacco: Never Used  Substance Use Topics  . Alcohol use: No    Alcohol/week: 0.0 oz  . Drug use: No     Allergies   Patient has no known allergies.   Review of Systems Review of Systems  Unable to perform ROS: Mental status change     Physical Exam Updated Vital Signs BP (!) 170/89   Pulse 82   Temp (!) 101.2 F (38.4 C) (Oral)   Resp 15   SpO2 94%   Physical Exam  Constitutional: He appears well-developed and well-nourished. No distress.  HENT:  Head: Normocephalic and atraumatic.  Eyes: Conjunctivae are normal.  Neck: Neck supple.  Cardiovascular: Normal rate, regular rhythm, normal heart sounds and intact distal pulses.  Pulmonary/Chest: Effort normal and breath sounds normal. No respiratory distress.  Abdominal: Soft. There is no tenderness. There is no guarding.  Musculoskeletal: He exhibits no edema.  Lymphadenopathy:    He has no cervical adenopathy.  Neurological: He is alert.  Patient is very hard of hearing, making communication difficult.  Patient able to raise both arms out in front of him. Grip strength equal. Dorsiflexion and plantar flexion at the ankles 5/5 bilaterally. No noted facial droop. Patient appears to handle oral secretions without difficulty.  No noted swallow  dysfunction. Patient would not follow commands for the remainder of neurologic testing, either because he does not understand or because he cannot adequately hear me.    Skin: Skin is warm and dry. Capillary refill takes less than 2 seconds. He is not diaphoretic.  Psychiatric: He has a normal mood and affect. His behavior is normal.  Nursing note and vitals reviewed.    ED Treatments / Results  Labs (all labs ordered are listed, but only abnormal results are displayed) Labs Reviewed  COMPREHENSIVE METABOLIC PANEL - Abnormal; Notable for the following components:      Result Value   Glucose, Bld 112 (*)    Creatinine, Ser 1.33 (*)    Albumin 3.2 (*)    ALT 12 (*)    GFR calc non Af Amer 45 (*)    GFR calc Af Amer 53 (*)    All other components within normal limits  CBC WITH DIFFERENTIAL/PLATELET - Abnormal; Notable for the following components:   WBC 11.4 (*)  RBC 3.57 (*)    Hemoglobin 10.2 (*)    HCT 31.8 (*)    Neutro Abs 9.6 (*)    Lymphs Abs 0.6 (*)    All other components within normal limits  URINALYSIS, ROUTINE W REFLEX MICROSCOPIC - Abnormal; Notable for the following components:   Hgb urine dipstick MODERATE (*)    Leukocytes, UA SMALL (*)    All other components within normal limits  CULTURE, BLOOD (ROUTINE X 2)  CULTURE, BLOOD (ROUTINE X 2)  I-STAT CG4 LACTIC ACID, ED  I-STAT TROPONIN, ED  I-STAT CG4 LACTIC ACID, ED   BUN  Date Value Ref Range Status  02/19/2017 16 6 - 20 mg/dL Final  02/07/2017 18 6 - 20 mg/dL Final  02/07/2017 16 6 - 20 mg/dL Final  02/07/2016 18 4 - 21 mg/dL Final  01/27/2016 13 6 - 20 mg/dL Final   Creatinine  Date Value Ref Range Status  02/07/2016 1.1 0.6 - 1.3 mg/dL Final   Creat  Date Value Ref Range Status  01/15/2014 0.96 0.50 - 1.35 mg/dL Final  09/10/2013 1.06 0.50 - 1.35 mg/dL Final  08/15/2013 1.02 0.50 - 1.35 mg/dL Final  04/24/2013 0.94 0.50 - 1.35 mg/dL Final   Creatinine, Ser  Date Value Ref Range Status    02/19/2017 1.33 (H) 0.61 - 1.24 mg/dL Final  02/07/2017 1.20 0.61 - 1.24 mg/dL Final  02/07/2017 1.24 0.61 - 1.24 mg/dL Final  01/27/2016 1.23 0.61 - 1.24 mg/dL Final     EKG  EKG Interpretation  Date/Time:  Monday February 19 2017 02:31:28 EST Ventricular Rate:  86 PR Interval:    QRS Duration: 116 QT Interval:  361 QTC Calculation: 432 R Axis:   -16 Text Interpretation:  Sinus rhythm Supraventricular bigeminy Short PR interval Left atrial enlargement Nonspecific intraventricular conduction delay No significant change since last tracing Confirmed by Deno Etienne [phone]) on 02/19/2017 3:03:12 AM       Radiology Dg Chest 2 View  Result Date: 02/19/2017 CLINICAL DATA:  Acute onset of generalized weakness. EXAM: CHEST  2 VIEW COMPARISON:  Chest radiograph performed 01/26/2016 FINDINGS: The lungs are well-aerated. Mild left basilar opacity could reflect atelectasis or possibly mild infection. There is no evidence of pleural effusion or pneumothorax. The heart is borderline enlarged. No acute osseous abnormalities are seen. An IVC filter is noted. IMPRESSION: Mild left basilar opacity could reflect atelectasis or mild infection. Borderline cardiomegaly Electronically Signed   By: Garald Balding M.D.   On: 02/19/2017 02:56    Procedures Procedures (including critical care time)  Medications Ordered in ED Medications  cefTRIAXone (ROCEPHIN) 1 g in dextrose 5 % 50 mL IVPB (not administered)  azithromycin (ZITHROMAX) 500 mg in dextrose 5 % 250 mL IVPB (not administered)  sodium chloride 0.9 % bolus 1,000 mL (1,000 mLs Intravenous New Bag/Given 02/19/17 0257)  acetaminophen (TYLENOL) tablet 500 mg (500 mg Oral Given 02/19/17 0255)     Initial Impression / Assessment and Plan / ED Course  I have reviewed the triage vital signs and the nursing notes.  Pertinent labs & imaging results that were available during my care of the patient were reviewed by me and considered in my medical  decision making (see chart for details).  Clinical Course as of Feb 20 356  Mon Feb 19, 2017  2585 Spoke with Dr. Marlowe Sax, IM resident. Agrees to admit patient.  [SJ]    Clinical Course User Index [SJ] Lorayne Bender, PA-C    Patient presents with  reported generalized weakness.  Possible confusion noted during exam, unknown if this is patient's baseline.  Patient is febrile with opacity suspicious for pneumonia on chest x-ray. Evidence of AKI with small increase in creatinine.  Admission for IV antibiotics and IV fluids.   Dec 4: Seen by Dr. Berneice Gandy and Dr. Lynnae January with internal medicine during office visit on 12/4.  Urine culture grew out Proteus mirabilis, resistant to nitrofurantoin, but sensitive to Bactrim.  Patient was prescribed 7 days of 400-80mg  Bactrim BID. Nov 28: Seen twice in the ED for generalized weakness. CT without acute abnormality. Nonfocal neuro exam. Strength 5/5 noted in all extremities. A&Ox4. Neurology follow up recommended at discharge.  Nov 14: patient seen in ED for hematuria Nov 9: Foley catheter placed in urology office  Findings and plan of care discussed with Deno Etienne, DO. Dr. Tyrone Nine personally evaluated and examined this patient.     Final Clinical Impressions(s) / ED Diagnoses   Final diagnoses:  Community acquired pneumonia of left lower lobe of lung [facility])    ED Discharge Orders    None       Layla Maw 02/19/17 [place], [place], DO 02/19/17 1624

## 2017-02-19 NOTE — ED Notes (Signed)
ED Provider at bedside. 

## 2017-02-19 NOTE — Progress Notes (Signed)
   Subjective: The patient was lying in the bed upon entering the room today with the lights off.  He denies pain intermittently but is unable to adequately respond to verbal stimuli and stated to think short uncomplicated phrases.  Patient is also hard of hearing displays difficulty assessing your statements.  Patient denied pain but stated that he was here pain pointing to he penis. He denied chest pain, abdominal pain, nausea, shortness of breath, or leg pain.  Objective:  Vital signs in last 24 hours: Vitals:   02/19/17 0900 02/19/17 0930 02/19/17 1000 02/19/17 1137  BP: (!) 144/75 126/77 140/82 (!) 148/92  Pulse: 71 65 70 76  Resp: (!) 27  13 16   Temp:      TempSrc:      SpO2: 98% 97% 99% 97%   ROS negative except as per HPI.  Physical Exam  Constitutional: He appears well-developed and well-nourished. No distress.  Cardiovascular: Normal rate and regular rhythm.  Murmur heard. Pulmonary/Chest: Effort normal and breath sounds normal. No respiratory distress.  Abdominal: Soft. Bowel sounds are normal. He exhibits no distension.  Genitourinary: Penis normal. No penile tenderness (Wearing condom catheter ).  Musculoskeletal: He exhibits no edema or tenderness.   Assessment/Plan:  Active Problems:   Generalized weakness  Generalized weakness: Patient presented with what appeared to be acute versus chronic encephalopathy with gait instability weakness,  Urinary Retention, Incontinence: Is a history of urinary retention due to BPH status post TURP.  Patient is incontinent enuresis, wears diapers at home.  He will trial of indwelling catheter which was removed on 11/30 due to discomfort.  Patient has had multiple UTIs secondary to urinary retention and catheterization. -Bladder scan as indicated -Condom catheter, monitor output -Pro-calcitonin not concerning for active bacterial infection at 0.27, based on the algorithm, decreased antibiotic use at this time is encouraged. -We  will continue ceftriaxone for UTI until urine culture results return -CBC-indicated a mild leukocytosis of 11.4 hemoglobin of 10.2 -Follow-up blood cultures, urine cultures which resulted -Physical therapy evaluation pending  H/O CVA: Patient has a history of right cerebellar artery CVA.  Patient has current complaints of dizziness, gait instability, and nystagmus was observed on a recent clinical evaluation.  MRI was ordered. -MRI of the brain is negative for acute intracranial processes indicating only chronic developments.  Hx of Hypertension: BP elevated to 170 on arrival. Currently 148/92.  -Continue home amlodipine, metoprolol, irbesartan  Hx DVT: -Continue home Xarelto 10 mg daily -Monitor for signs of GI bleeding  Dispo: Anticipated discharge in approximately 1-2 day(s).   Kathi Ludwig, MD 02/19/2017, 11:48 AM Pager: Pager# [phone]

## 2017-02-20 DIAGNOSIS — J181 Lobar pneumonia, unspecified organism: Secondary | ICD-10-CM

## 2017-02-20 DIAGNOSIS — J189 Pneumonia, unspecified organism: Principal | ICD-10-CM

## 2017-02-20 LAB — URINE CULTURE: Culture: NO GROWTH

## 2017-02-20 NOTE — NC FL2 (Signed)
[place] LEVEL OF CARE SCREENING TOOL     IDENTIFICATION  Patient Name: [name] Birthdate: [birth year] Sex: male Admission Date (Current Location): 02/19/2017  [facility] and [place] Number:  Herbalist and Address:  [facility] [place]. [facility], [place] [address] [place], [place] [address]      Provider Number: [id number]  Attending Physician Name and Address:  Sid Falcon, MD  Relative Name and Phone Number:       Current Level of Care: Hospital Recommended Level of Care: [place] Prior Approval Number:    Date Approved/Denied:   PASRR Number: [id number]  Discharge Plan: SNF    Current Diagnoses: Patient Active Problem List   Diagnosis Date Noted  . Generalized weakness 02/19/2017  . Cerebellar ataxia in diseases classified elsewhere ([place]) 02/13/2017  . Social problem 11/07/2016  . Left ankle swelling 08/03/2016  . Dementia 01/28/2016  . Gait instability 01/26/2016  . Hypernatremia   . Hematuria 02/26/2015  . Weakness 02/26/2015  . UTI (urinary tract infection) 07/17/2014  . Presence of IVC filter 07/17/2014  . Right knee pain 05/25/2014  . Long term current use of anticoagulant therapy- Xarelto 03/26/2014  . Onychomycosis 01/15/2014  . Vitreous hemorrhage ([place]) 12/21/2013  . Cerebral embolism with cerebral infarction ([place]) 12/19/2013  . Gait abnormality 12/18/2013  . Left adrenal mass ([place]) 09/11/2013  . Orthostatic hypotension 07/10/2013  . CKD (chronic kidney disease) stage 3, GFR 30-59 ml/min (HCC) 06/23/2013  . Hyperlipidemia   . Urinary retention 06/15/2013  . Normocytic anemia 06/11/2013  . Leg DVT (deep venous thromboembolism), chronic ([place]) 06/10/2013  . Physical deconditioning 05/30/2013  . BPH (benign prostatic hyperplasia) 04/24/2013  . Preoperative examination 04/24/2013  . Primary hyperparathyroidism ([place]) 09/02/2012  . Health care maintenance 08/30/2012  . Frequent falls 06/23/2012   . Ataxia 09/15/2011  . 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 Hearing loss 07/20/2011  . Meningioma ([place]) 07/20/2011  . HTN (hypertension) 07/19/2011  . CVA (cerebral infarction) 07/19/2011    Orientation RESPIRATION BLADDER Height & Weight     Self, Place  O2(Nasal cannula 5L) Incontinent, External catheter Weight: 91.2 kg (201 lb 1 oz) Height:  5' 9.02" (175.3 cm)  BEHAVIORAL SYMPTOMS/MOOD NEUROLOGICAL BOWEL NUTRITION STATUS      Continent Diet(Please see DC Summary)  AMBULATORY STATUS COMMUNICATION OF NEEDS Skin   Limited Assist Verbally Normal                       Personal Care Assistance Level of Assistance  Bathing, Feeding, Dressing Bathing Assistance: Maximum assistance Feeding assistance: Limited assistance Dressing Assistance: Limited assistance     Functional Limitations Info             SPECIAL CARE FACTORS FREQUENCY  PT (By licensed PT)     PT Frequency: 5x/week              Contractures      Additional Factors Info  Code Status, Allergies Code Status Info: Full Allergies Info: NKA           Current Medications (02/20/2017):  This is the current hospital active medication list Current Facility-Administered Medications  Medication Dose Route Frequency Provider Last Rate Last Dose  . acetaminophen (TYLENOL) tablet 650 mg  650 mg Oral Q6H PRN Shela Leff, MD   650 mg at 02/19/17 1622   Or  . acetaminophen (TYLENOL) suppository 650 mg  650 mg Rectal Q6H PRN  Shela Leff, MD      . amLODipine (NORVASC) tablet 5 mg  5 mg Oral Daily Shela Leff, MD   5 mg at 02/20/17 8088  . atorvastatin (LIPITOR) tablet 20 mg  20 mg Oral q1800 Shela Leff, MD   20 mg at 02/19/17 1737  . azithromycin (ZITHROMAX) tablet 250 mg  250 mg Oral Daily Shela Leff, MD   250 mg at 02/20/17 0920  . cefTRIAXone (ROCEPHIN) 1 g in dextrose 5 % 50 mL IVPB  1 g Intravenous Q24H Sid Falcon, MD   Stopped at  02/20/17 [phone]  . irbesartan (AVAPRO) tablet 300 mg  300 mg Oral Daily Shela Leff, MD   300 mg at 02/20/17 1594  . metoprolol tartrate (LOPRESSOR) tablet 25 mg  25 mg Oral BID Shela Leff, MD   25 mg at 02/20/17 5859  . rivaroxaban (XARELTO) tablet 10 mg  10 mg Oral Daily Shela Leff, MD   10 mg at 02/20/17 2924     Discharge Medications: Please see discharge summary for a list of discharge medications.  Relevant Imaging Results:  Relevant Lab Results:   Additional Information SSN: [id number]  [name], LCSWA

## 2017-02-20 NOTE — Progress Notes (Addendum)
   Subjective: Patient was lying in his bed today for entering the room he is particularly hard of hearing and unable to answer most of her questions is laughing instead.  The patient stated that he was in agreement to going to a short-term rehabilitation facility as he discussed with his daughter but this would be less than 2 weeks.  The patient is oriented to date and self but is unable to adequately answer the majority of questions to to the level of his hearing impairment.  Patient denied pain or acute complaints or questions at this time.  Objective:  Vital signs in last 24 hours: Vitals:   02/19/17 1614 02/19/17 1931 02/19/17 2057 02/20/17 0537  BP: (!) 145/76  (!) 123/59 (!) 147/91  Pulse:   65 75  Resp: (!) 22  20 20   Temp: (!) 101.1 F (38.4 C) 100.2 F (37.9 C) 99 F (37.2 C) 98.5 F (36.9 C)  TempSrc: Oral Oral Oral Oral  SpO2: 96%  98% 100%  Weight: 201 lb 1 oz (91.2 kg)     Height: 5' 9.02" (1.753 m)      ROS negative except as per HPI.  Physical Exam  Constitutional: He appears well-developed and well-nourished. No distress.  Cardiovascular: Normal rate and intact distal pulses.  Pulmonary/Chest: Effort normal and breath sounds normal. No respiratory distress.  Abdominal: Bowel sounds are normal. He exhibits no distension.  Musculoskeletal: He exhibits no edema or tenderness.  Vitals reviewed.  Assessment/Plan:  Active Problems:   HTN (hypertension)   Frequent falls   UTI (urinary tract infection)   Generalized weakness   Generalized weakness: Patient presented with what appeared to be acute versus chronic encephalopathy with gait instability and weakness that had worsened when compared to his baseline.  The patient is medically cleared for discharge at this time.  Urinary Retention, Incontinence: Is a history of urinary retention due to BPH status post TURP.  Patient is incontinent enuresis, wears diapers at home.  He will trial of indwelling catheter which  was removed on 11/30 due to discomfort.  Patient has had multiple UTIs secondary to urinary retention and catheterization. -Bladder scan as indicated -Condom catheter, monitor output -Pro-calcitonin not concerning for active bacterial infection at 0.27, based on the algorithm, decreased antibiotic use at this time is encouraged. -We will continue ceftriaxone until discharge and switch to cefdinir at that time -CBC-indicated a mild leukocytosis of 11.4 hemoglobin of 10.2 -Follow-up blood cultures pending, urine cultures negative -Physical therapy evaluation recommending SNF, family in agreement, Social work Holiday representative  -We appreciate social works effort on Catering manager.  H/O CVA: Patient has a history of right cerebellar artery CVA.  Patient has current complaints of dizziness, gait instability, with nystagmus being observed on a recent clinical evaluation in the outpatient setting.  MRI was ordered. -MRI of the brain is negative for acute intracranial processes indicating only chronic developments.  Hx of Hypertension: BP elevated to 170 on arrival. Currently 14/91.  -Continue home amlodipine, metoprolol, irbesartan as pressures have varied greatly  Hx DVT: -Continue home Xarelto 10 mg daily at this time -Continue to monitor for signs of GI bleeding  Dispo: Anticipated discharge in approximately 1-2 day(s).   Kathi Ludwig, MD 02/20/2017, 7:53 AM Pager: Pager# [phone]

## 2017-02-20 NOTE — Progress Notes (Signed)
  Date: 02/20/2017  Patient name: [name]  Medical record number: [id number]  Date of birth: [birth year]   I have seen and evaluated this patient and I have discussed the plan of care with the house staff. Please see Dr. Nelma Rothman note for complete details. I concur with his findings with the following additions/corrections:   Rocephin/Cefdinir will be continued for a 5 day course due for concern of a mild pneumonia seen on CXR, not for urinary issues.  UC was negative for any bacteria and he has been emptying his bladder.  He will go to short term rehab at SNF with plan to return home.  D/C once SNF bed available.    Sid Falcon, MD 02/20/2017, 12:06 PM

## 2017-02-20 NOTE — NC FL2 (Signed)
[place] LEVEL OF CARE SCREENING TOOL     IDENTIFICATION  Patient Name: [name] Birthdate: [birth year] Sex: male Admission Date (Current Location): 02/19/2017  [facility] and [place] Number:  Herbalist and Address:  [facility] [place]. [facility], [place] [address] [place], [place] [address]      Provider Number: [id number]  Attending Physician Name and Address:  Sid Falcon, MD  Relative Name and Phone Number:       Current Level of Care: Hospital Recommended Level of Care: [place] Prior Approval Number:    Date Approved/Denied:   PASRR Number: [id number]  Discharge Plan: SNF    Current Diagnoses: Patient Active Problem List   Diagnosis Date Noted  . Generalized weakness 02/19/2017  . Cerebellar ataxia in diseases classified elsewhere ([place]) 02/13/2017  . Social problem 11/07/2016  . Left ankle swelling 08/03/2016  . Dementia 01/28/2016  . Gait instability 01/26/2016  . Hypernatremia   . Hematuria 02/26/2015  . Weakness 02/26/2015  . UTI (urinary tract infection) 07/17/2014  . Presence of IVC filter 07/17/2014  . Right knee pain 05/25/2014  . Long term current use of anticoagulant therapy- Xarelto 03/26/2014  . Onychomycosis 01/15/2014  . Vitreous hemorrhage ([place]) 12/21/2013  . Cerebral embolism with cerebral infarction ([place]) 12/19/2013  . Gait abnormality 12/18/2013  . Left adrenal mass ([place]) 09/11/2013  . Orthostatic hypotension 07/10/2013  . CKD (chronic kidney disease) stage 3, GFR 30-59 ml/min (HCC) 06/23/2013  . Hyperlipidemia   . Urinary retention 06/15/2013  . Normocytic anemia 06/11/2013  . Leg DVT (deep venous thromboembolism), chronic ([place]) 06/10/2013  . Physical deconditioning 05/30/2013  . BPH (benign prostatic hyperplasia) 04/24/2013  . Preoperative examination 04/24/2013  . Primary hyperparathyroidism ([place]) 09/02/2012  . Health care maintenance 08/30/2012  . Frequent falls 06/23/2012   . Ataxia 09/15/2011  . 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 Hearing loss 07/20/2011  . Meningioma ([place]) 07/20/2011  . HTN (hypertension) 07/19/2011  . CVA (cerebral infarction) 07/19/2011    Orientation RESPIRATION BLADDER Height & Weight     Self, Place  O2(Nasal cannula 5L) Incontinent, External catheter Weight: 201 lb 1 oz (91.2 kg) Height:  5' 9.02" (175.3 cm)  BEHAVIORAL SYMPTOMS/MOOD NEUROLOGICAL BOWEL NUTRITION STATUS      Continent Diet(Please see DC Summary)  AMBULATORY STATUS COMMUNICATION OF NEEDS Skin   Limited Assist Verbally Normal                       Personal Care Assistance Level of Assistance  Bathing, Feeding, Dressing Bathing Assistance: Maximum assistance Feeding assistance: Limited assistance Dressing Assistance: Limited assistance     Functional Limitations Info             SPECIAL CARE FACTORS FREQUENCY  OT (By licensed OT)     PT Frequency: 5x/week OT Frequency: 5/wk            Contractures      Additional Factors Info  Code Status, Allergies Code Status Info: Full Allergies Info: NKA           Current Medications (02/20/2017):  This is the current hospital active medication list Current Facility-Administered Medications  Medication Dose Route Frequency Provider Last Rate Last Dose  . acetaminophen (TYLENOL) tablet 650 mg  650 mg Oral Q6H PRN Shela Leff, MD   650 mg at 02/19/17 1622   Or  . acetaminophen (TYLENOL) suppository 650 mg  650 mg Rectal Q6H  PRN Shela Leff, MD      . amLODipine (NORVASC) tablet 5 mg  5 mg Oral Daily Shela Leff, MD   5 mg at 02/20/17 8115  . atorvastatin (LIPITOR) tablet 20 mg  20 mg Oral q1800 Shela Leff, MD   20 mg at 02/19/17 1737  . azithromycin (ZITHROMAX) tablet 250 mg  250 mg Oral Daily Shela Leff, MD   250 mg at 02/20/17 0920  . cefTRIAXone (ROCEPHIN) 1 g in dextrose 5 % 50 mL IVPB  1 g Intravenous Q24H Sid Falcon, MD    Stopped at 02/20/17 [phone]  . irbesartan (AVAPRO) tablet 300 mg  300 mg Oral Daily Shela Leff, MD   300 mg at 02/20/17 0355  . metoprolol tartrate (LOPRESSOR) tablet 25 mg  25 mg Oral BID Shela Leff, MD   25 mg at 02/20/17 9741  . rivaroxaban (XARELTO) tablet 10 mg  10 mg Oral Daily Shela Leff, MD   10 mg at 02/20/17 6384     Discharge Medications: Please see discharge summary for a list of discharge medications.  Relevant Imaging Results:  Relevant Lab Results:   Additional Information SSN: [id number]  Jorge Ny, LCSW

## 2017-02-20 NOTE — Discharge Summary (Signed)
Name: [name] MRN: [id number] DOB: 02-Mar-1925 83 y.o. PCP: [name], DO  Date of Admission: 02/19/2017  1:11 AM Date of Discharge: 02/21/2017 Attending Physician: Sid Falcon, MD  Discharge Diagnosis: Active Problems:   HTN (hypertension)   Frequent falls   UTI (urinary tract infection)   Generalized weakness   Community acquired pneumonia of left lower lobe of lung [facility])   Discharge Medications: Allergies as of 02/21/2017   No Known Allergies     Medication List    STOP taking these medications   sulfamethoxazole-trimethoprim 400-80 MG tablet Commonly known as:  BACTRIM,SEPTRA     TAKE these medications   amLODipine 5 MG tablet Commonly known as:  NORVASC Take 1 tablet (5 mg total) by mouth daily.   atorvastatin 20 MG tablet Commonly known as:  LIPITOR Take 1 tablet (20 mg total) by mouth daily at 6 PM.   cefdinir 300 MG capsule Commonly known as:  OMNICEF Take 1 capsule (300 mg total) by mouth 2 (two) times daily.   irbesartan 300 MG tablet Commonly known as:  AVAPRO Take 1 tablet (300 mg total) by mouth daily.   metoprolol tartrate 25 MG tablet Commonly known as:  LOPRESSOR Take 1 tablet (25 mg total) by mouth 2 (two) times daily.   rivaroxaban 10 MG Tabs tablet Commonly known as:  XARELTO Take 1 tablet (10 mg total) by mouth daily. PHARMACIST VOID XARELTO 15 MG PRESCRIPTION       Disposition and follow-up:   [name] was discharged from [facility] in Stable condition.  At the hospital follow up visit please address:  1.  Patient confusion and generalized weakness for complete resolution.       Please assess the patient for UTI symptoms as these are a frequent occurrence.       CAP- please assess patients respiratory status and need for additional antibiotics   2.  Labs / imaging needed at time of follow-up: n/a  3.  Pending labs/ test needing follow-up: n/a  Follow-up Appointments:  Contact  information for follow-up providers    Joni Reining C, DO Follow up in 1 week(s).   Specialty:  Internal Medicine Contact information: [place] [address] [phone]            Contact information for after-discharge care    Destination    [facility] Follow up.   Service:  Skilled Nursing Contact information: [place] [facility] Course by problem list: Active Problems:   HTN (hypertension)   Frequent falls   UTI (urinary tract infection)   Generalized weakness   Community acquired pneumonia of left lower lobe of lung ([place])   1. Community acquired pneumonia of left lower lobe of lung Patient initial x-ray revealed mild left basilar opacity which could reflect atelectasis versus mild infection or pneumonia.  Given the alteration in his mentation he was treated with ceftriaxone to cover for UTI given dysuria and to treat possible pneumonia.  Patient improved rapidly following his admission and returned to his mental baseline per daughter.  Patient remained afebrile, normotensive, with mild leukocytosis 11.4 and hemoglobin of 10.2(near baseline). The patients procalcitonin was 0.27, lower enough to recommend against continued antibiotics but given the patient improvement with the relatively singular therapy antibiotic treatment was continued for a total course  of five days of treatment.   2. Frequent falls: Patient has a history of falls in the past most likely secondary to his previous CVA. He did not experience an event during his stay but was recommended to undergo short term rehabilitation in a SNF upon discharge. As such, social work was consulted to assist with this discharge. Please assist patient with ambulation.  3. HTN: Patient's blood pressure was moderately well controlled during his admission on his home medication regimen which was not changed during this stay.   He was discharged on irbesartan 300 mg daily, metoprolol tartrate 25 mg twice daily, and amlodipine 5 mg daily.   4. UTI(frequent reoccurrences)  Although the patient a documented history of frequent UTIs, urine cultures were negative for growth at 24 hours during this admission.  Patient was concerned with dysuria, said that he had penile pain while urinating upon admission but denied this this is subsequent day.  Given the concern for UTIs patient's mentation was discharged with cefdinir to complete a 5-day course of antibiotics. Negative urine culture.   Discharge Vitals:   BP 136/78 (BP Location: Right Arm)   Pulse 65   Temp 98.3 F (36.8 C) (Oral)   Resp 18   Ht 5' 9.02" (1.753 m)   Wt 201 lb 1 oz (91.2 kg)   SpO2 100%   BMI 29.68 kg/m   Pertinent Labs, Studies, and Procedures:  CBC Latest Ref Rng & Units 02/19/2017 02/07/2017 02/07/2017  WBC 4.0 - 10.5 K/uL 11.4(H) - 8.4  Hemoglobin 13.0 - 17.0 g/dL 10.2(L) 12.6(L) 11.8(L)  Hematocrit 39.0 - 52.0 % 31.8(L) 37.0(L) 37.1(L)  Platelets 150 - 400 K/uL 177 - 170   CMP Latest Ref Rng & Units 02/19/2017 02/07/2017 02/07/2017  Glucose 65 - 99 mg/dL 112(H) 125(H) 127(H)  BUN 6 - 20 mg/dL 16 18 16   Creatinine 0.61 - 1.24 mg/dL 1.33(H) 1.20 1.24  Sodium 135 - 145 mmol/L 135 142 138  Potassium 3.5 - 5.1 mmol/L 3.9 3.8 3.8  Chloride 101 - 111 mmol/L 105 101 104  CO2 22 - 32 mmol/L 22 - 28  Calcium 8.9 - 10.3 mg/dL 9.5 - 10.2  Total Protein 6.5 - 8.1 g/dL 6.9 - 6.9  Total Bilirubin 0.3 - 1.2 mg/dL 0.5 - 0.4  Alkaline Phos 38 - 126 U/L 53 - 63  AST 15 - 41 U/L 22 - 19  ALT 17 - 63 U/L 12(L) - 11(L)   MRI Head: IMPRESSION: 1.  No acute intracranial abnormality. 2. Chronic severe ischemic and small vessel disease with progression in the bilateral deep gray matter nuclei, and increased number of scattered chronic micro-hemorrhages since 2015. Extensive chronic cerebellar involvement. 3. 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  CT Head: IMPRESSION: 1. No intracranial hemorrhage or CT findings of acute ischemia. 2. Advanced atrophy and chronic small vessel ischemia. Multifocal remote infarcts. 3. Unchanged calcified meningioma arising from the left sphenoid Wing.  CXR: IMPRESSION: Mild left basilar opacity could reflect atelectasis or mild infection. Borderline cardiomegaly  Discharge Instructions: Discharge Instructions    Diet - low sodium heart healthy   Complete by:  As directed    Increase activity slowly   Complete by:  As directed       Signed: Kathi Ludwig, MD 02/21/2017, 12:44 PM   Pager: Pager# [phone]

## 2017-02-20 NOTE — Clinical Social Work Note (Signed)
Clinical Social Work Assessment  Patient Details  Name: [name] MRN: [id number] Date of Birth: Nov 27, 1924  Date of referral:  02/20/17               Reason for consult:  Facility Placement                Permission sought to share information with:  Facility Sport and exercise psychologist, Family Supports Permission granted to share information::  Yes, Verbal Permission Granted  Name::     Soil scientist::  SNFs  Relationship::  Daughter  Contact Information:  [phone]  Housing/Transportation Living arrangements for the past 2 months:  [place] of Information:  Adult Children, Patient Patient Interpreter Needed:  None Criminal Activity/Legal Involvement Pertinent to Current Situation/Hospitalization:  No - Comment as needed Significant Relationships:  Adult Children Lives with:  Self Do you feel safe going back to the place where you live?  No Need for family participation in patient care:  Yes (Comment)  Care giving concerns:  CSW received consult for possible SNF placement at time of discharge. CSW spoke with patient and his daughter at bedside regarding PT recommendation of SNF placement at time of discharge. Patient's daughter reported that she is currently unable to care for patient at his home given patient's current physical needs and fall risk. Patient expressed understanding of PT recommendation and is agreeable to SNF placement at time of discharge. CSW to continue to follow and assist with discharge planning needs.   Social Worker assessment / plan:  CSW spoke with patient's daughter concerning possibility of rehab at [facility] before returning home.  Employment status:  Retired Nurse, adult PT Recommendations:  [place] / Referral to community resources:  [place]  Patient/Family's Response to care:  Patient's daughter recognizes need for rehab before returning home and is agreeable  to a SNF in [place]. Patient's daughter reported preference for [facility].  Patient/Family's Understanding of and Emotional Response to Diagnosis, Current Treatment, and Prognosis:  Patient/family is realistic regarding therapy needs and expressed being hopeful for SNF placement. Patient's daughter expressed understanding of CSW role and discharge process as well as medical condition. Patient pleasant but not able to participate in conversation much. No questions/concerns about plan or treatment.    Emotional Assessment Appearance:  Appears stated age Attitude/Demeanor/Rapport:  Other(Appropriate) Affect (typically observed):  Accepting, Appropriate Orientation:  Oriented to Self Alcohol / Substance use:  Not Applicable Psych involvement (Current and /or in the community):  No (Comment)  Discharge Needs  Concerns to be addressed:  Care Coordination Readmission within the last 30 days:  No Current discharge risk:  None Barriers to Discharge:  Continued Medical Work up   [employer], [place] 02/20/2017, 5:45 PM

## 2017-02-21 DIAGNOSIS — R488 Other symbolic dysfunctions: Secondary | ICD-10-CM | POA: Diagnosis not present

## 2017-02-21 DIAGNOSIS — R32 Unspecified urinary incontinence: Secondary | ICD-10-CM

## 2017-02-21 DIAGNOSIS — Z8673 Personal history of transient ischemic attack (TIA), and cerebral infarction without residual deficits: Secondary | ICD-10-CM | POA: Diagnosis not present

## 2017-02-21 DIAGNOSIS — Z974 Presence of external hearing-aid: Secondary | ICD-10-CM

## 2017-02-21 DIAGNOSIS — R531 Weakness: Secondary | ICD-10-CM | POA: Diagnosis not present

## 2017-02-21 DIAGNOSIS — N3090 Cystitis, unspecified without hematuria: Secondary | ICD-10-CM | POA: Diagnosis not present

## 2017-02-21 DIAGNOSIS — M6281 Muscle weakness (generalized): Secondary | ICD-10-CM | POA: Diagnosis not present

## 2017-02-21 DIAGNOSIS — R2689 Other abnormalities of gait and mobility: Secondary | ICD-10-CM | POA: Diagnosis not present

## 2017-02-21 DIAGNOSIS — J189 Pneumonia, unspecified organism: Secondary | ICD-10-CM | POA: Diagnosis not present

## 2017-02-21 DIAGNOSIS — R339 Retention of urine, unspecified: Secondary | ICD-10-CM | POA: Diagnosis not present

## 2017-02-21 DIAGNOSIS — J181 Lobar pneumonia, unspecified organism: Secondary | ICD-10-CM | POA: Diagnosis not present

## 2017-02-21 DIAGNOSIS — Z23 Encounter for immunization: Secondary | ICD-10-CM | POA: Diagnosis not present

## 2017-02-21 DIAGNOSIS — E785 Hyperlipidemia, unspecified: Secondary | ICD-10-CM | POA: Diagnosis not present

## 2017-02-21 DIAGNOSIS — J80 Acute respiratory distress syndrome: Secondary | ICD-10-CM | POA: Diagnosis not present

## 2017-02-21 MED ORDER — CEFDINIR 300 MG PO CAPS: 300.0000 mg | ORAL_CAPSULE | Freq: Two times a day (BID) | ORAL | 0 refills | Status: DC

## 2017-02-21 NOTE — Discharge Instructions (Signed)
FOLLOW-UP INSTRUCTIONS When: Within one week For: Hospital follow-up for confusion and possible mild pneumonia What to bring: all of your medications  Thank you for your visit to the [facility]. If your symptoms occur again or you have any questions please do not hesitate to call you primary care doctor.   You no longer need to complete the MRI of the brain on December 13th as one was performed during this admission.

## 2017-02-21 NOTE — Progress Notes (Signed)
  Date: 02/21/2017  Patient name: [name]  Medical record number: [id number]  Date of birth: November 13, 1924   I have seen and evaluated this patient and I have discussed the plan of care with the house staff. Please see Dr. Nelma Rothman note for complete details. I concur with his findings.  Plan for discharge to [facility] today.   Sid Falcon, MD 02/21/2017, 1:40 PM

## 2017-02-21 NOTE — Progress Notes (Signed)
[name] to be D/C'd to [facility] per MD order. Report given to [place] at [facility].  Allergies as of 02/21/2017   No Known Allergies     Medication List    STOP taking these medications   sulfamethoxazole-trimethoprim 400-80 MG tablet Commonly known as:  BACTRIM,SEPTRA     TAKE these medications   amLODipine 5 MG tablet Commonly known as:  NORVASC Take 1 tablet (5 mg total) by mouth daily.   atorvastatin 20 MG tablet Commonly known as:  LIPITOR Take 1 tablet (20 mg total) by mouth daily at 6 PM.   cefdinir 300 MG capsule Commonly known as:  OMNICEF Take 1 capsule (300 mg total) by mouth 2 (two) times daily.   irbesartan 300 MG tablet Commonly known as:  AVAPRO Take 1 tablet (300 mg total) by mouth daily.   metoprolol tartrate 25 MG tablet Commonly known as:  LOPRESSOR Take 1 tablet (25 mg total) by mouth 2 (two) times daily.   rivaroxaban 10 MG Tabs tablet Commonly known as:  XARELTO Take 1 tablet (10 mg total) by mouth daily. PHARMACIST VOID XARELTO 15 MG PRESCRIPTION       VVS, Skin clean, dry and intact without evidence of skin break down, no evidence of skin tears noted.  IV catheter discontinued intact. Site without signs and symptoms of complications. Dressing and pressure applied.  An After Visit Summary was printed and given to the patient.  Patient escorted via stretcher, and D/C to [facility] via [facility].  [name]  02/21/2017 2:47 PM

## 2017-02-21 NOTE — Clinical Social Work Placement (Signed)
   CLINICAL SOCIAL WORK PLACEMENT  NOTE  Date:  02/21/2017  Patient Details  Name: [name] MRN: [id number] Date of Birth: 1924-12-30  Clinical Social Work is seeking post-discharge placement for this patient at the [address] level of care (*CSW will initial, date and re-position this form in  chart as items are completed):  Yes   Patient/family provided with [place] Work Department's list of facilities offering this level of care within the geographic area requested by the patient (or if unable, by the patient's family).  Yes   Patient/family informed of their freedom to choose among providers that offer the needed level of care, that participate in Medicare, Medicaid or managed care program needed by the patient, have an available bed and are willing to accept the patient.  Yes   Patient/family informed of [place]'s ownership interest in [facility] and [facility], as well as of the fact that they are under no obligation to receive care at these facilities.  PASRR submitted to EDS on       PASRR number received on       Existing PASRR number confirmed on 02/20/17     FL2 transmitted to all facilities in geographic area requested by pt/family on 02/20/17     FL2 transmitted to all facilities within larger geographic area on       Patient informed that his/her managed care company has contracts with or will negotiate with certain facilities, including the following:        Yes   Patient/family informed of bed offers received.  Patient chooses bed at [facility]     Physician recommends and patient chooses bed at      Patient to be transferred to [facility] on 02/21/17.  Patient to be transferred to facility by [facility]     Patient family notified on 02/21/17 of transfer.  Name of family member notified:  Daughter     PHYSICIAN Please sign FL2     Additional Comment:     _______________________________________________ [name], [place] 02/21/2017, 1:29 PM

## 2017-02-21 NOTE — Consult Note (Signed)
            [facility] CM Primary Care Navigator  02/21/2017  [name] February 07, 1925 [id number]   Went to see patient at the bedside to identify possible discharge needs but he is getting ready to be transported by two [facility] staff.    Patient is being discharged to skilled nursing facility (SNF- [facility]) per therapy recommendation, for rehabilitation prior to returning home.  Patient letter (with PCP'scontact number) was provided to patient as a reminder to call primary care provider's office whenhe returnsback home, for a post dischargefollow-up appointment within 1-2weeksor sooner if needs arise.   [facility] care management information provided for future needs thathemay have.  Patient letter was brought by [facility] staff with them along with patient's belongings.  For additional questions please contact:  Edwena Felty A. Emonii Wienke, BSN, RN-BC [facility] PRIMARY CARE Navigator Cell: [phone]

## 2017-02-21 NOTE — Progress Notes (Signed)
Patient will DC to: [facility] Anticipated DC date: 02/21/17 Family notified: Daughter Transport by: Corey Harold   Per MD patient ready for DC to [facility]. RN, patient, patient's family, and facility notified of DC. Discharge Summary sent to facility. RN given number for report [phone]). DC packet on chart. Ambulance transport requested for patient.   CSW signing off.  Cedric Fishman, [place] Social Worker [phone]

## 2017-02-21 NOTE — Progress Notes (Addendum)
   Subjective: The patient was alert and oriented today.  He was lying in bed upon entering the room today denied acute complaints.  The patient's hearing aid is working better today with complaints the majority of her questions without significant sternal delay.  The patient was laughing appropriately to questions stated that he is understood the plan to go to rehab facility short-term for returning home.  Patient denied pain, or concerns at this time.  Objective:  Vital signs in last 24 hours: Vitals:   02/20/17 0922 02/20/17 1306 02/20/17 2137 02/21/17 0512  BP:  138/82 (!) 142/71 136/78  Pulse: 98 74 74 65  Resp:  20 18 18   Temp:  98 F (36.7 C) 98.9 F (37.2 C) 98.3 F (36.8 C)  TempSrc:  Oral Oral Oral  SpO2:  100% 97% 100%  Weight:      Height:       ROS negative except as per HPI.  Physical Exam  Constitutional: He appears well-developed and well-nourished. No distress.  Cardiovascular: Normal rate and regular rhythm.  No murmur heard. Pulmonary/Chest: Effort normal and breath sounds normal. No respiratory distress.  Abdominal: Soft. Bowel sounds are normal. He exhibits no distension.  Musculoskeletal: He exhibits no edema or tenderness.  Vitals reviewed.  Assessment/Plan:  Active Problems:   HTN (hypertension)   Frequent falls   UTI (urinary tract infection)   Generalized weakness   Community acquired pneumonia of left lower lobe of lung ([place])  Patient is medically cleared for discharge at this time.  Generalized weakness: Patient has improved dramatically since admission but given continued generalized weakness will warrant SNF placement for rehab prior to being returned home.  At this time we are attributing worsening weakness mostly to a mild case of pneumonia.  We will continue antibiotic treatment on discharge with Cefdinir PO to complete a 5-day course of antibiotics.  Urinary Retention, Incontinence: Patient is maintaining adequate urinary output condom  catheter since admission. -Social work is placing the patient at a SNF facility for rehab, hopefully today.  We appreciate their assistance with this matter -Given patient's improvement with antibiotics at this time concern for.   H/O CVA: Patient has documented history of CVA in the past.  With multiple chronic changes on MRI.  There are no acute changes on MRI at this time to indicate worsening of any previous condition or new CVA.  Hx of HTN: Patient's blood pressure is moderately well controlled at 136/78 most recently.  We will continue his home amlodipine, metoprolol, irbesartan, on discharge.  History of DVT: -Continue home Xarelto 10 mg daily at this time -Continue to monitor for signs of GI bleeding   Dispo: Anticipated discharge in approximately 0-1 day(s).   Kathi Ludwig, MD 02/21/2017, 7:00 AM Pager: Pager# [phone]

## 2017-02-22 ENCOUNTER — Ambulatory Visit (HOSPITAL_COMMUNITY): Admission: RE | Admit: 2017-02-22 | Payer: Medicare Other | Source: Ambulatory Visit

## 2017-02-23 DIAGNOSIS — N3090 Cystitis, unspecified without hematuria: Secondary | ICD-10-CM | POA: Diagnosis not present

## 2017-02-23 DIAGNOSIS — E785 Hyperlipidemia, unspecified: Secondary | ICD-10-CM | POA: Diagnosis not present

## 2017-02-23 DIAGNOSIS — Z8673 Personal history of transient ischemic attack (TIA), and cerebral infarction without residual deficits: Secondary | ICD-10-CM | POA: Diagnosis not present

## 2017-02-23 DIAGNOSIS — J181 Lobar pneumonia, unspecified organism: Secondary | ICD-10-CM | POA: Diagnosis not present

## 2017-02-24 LAB — CULTURE, BLOOD (ROUTINE X 2)
Culture: NO GROWTH
Culture: NO GROWTH
Special Requests: ADEQUATE
Special Requests: ADEQUATE

## 2017-03-19 DIAGNOSIS — Z87440 Personal history of urinary (tract) infections: Secondary | ICD-10-CM | POA: Diagnosis not present

## 2017-03-19 DIAGNOSIS — Z7901 Long term (current) use of anticoagulants: Secondary | ICD-10-CM | POA: Diagnosis not present

## 2017-03-19 DIAGNOSIS — Z9181 History of falling: Secondary | ICD-10-CM | POA: Diagnosis not present

## 2017-03-19 DIAGNOSIS — R270 Ataxia, unspecified: Secondary | ICD-10-CM | POA: Diagnosis not present

## 2017-03-19 DIAGNOSIS — M6281 Muscle weakness (generalized): Secondary | ICD-10-CM | POA: Diagnosis not present

## 2017-03-19 DIAGNOSIS — R296 Repeated falls: Secondary | ICD-10-CM | POA: Diagnosis not present

## 2017-03-19 DIAGNOSIS — Z8673 Personal history of transient ischemic attack (TIA), and cerebral infarction without residual deficits: Secondary | ICD-10-CM | POA: Diagnosis not present

## 2017-03-19 DIAGNOSIS — R54 Age-related physical debility: Secondary | ICD-10-CM | POA: Diagnosis not present

## 2017-03-19 DIAGNOSIS — Z8701 Personal history of pneumonia (recurrent): Secondary | ICD-10-CM | POA: Diagnosis not present

## 2017-03-20 ENCOUNTER — Telehealth: Payer: Self-pay

## 2017-03-20 DIAGNOSIS — R296 Repeated falls: Secondary | ICD-10-CM | POA: Diagnosis not present

## 2017-03-20 DIAGNOSIS — Z8673 Personal history of transient ischemic attack (TIA), and cerebral infarction without residual deficits: Secondary | ICD-10-CM | POA: Diagnosis not present

## 2017-03-20 DIAGNOSIS — Z7901 Long term (current) use of anticoagulants: Secondary | ICD-10-CM | POA: Diagnosis not present

## 2017-03-20 DIAGNOSIS — R270 Ataxia, unspecified: Secondary | ICD-10-CM | POA: Diagnosis not present

## 2017-03-20 DIAGNOSIS — Z87440 Personal history of urinary (tract) infections: Secondary | ICD-10-CM | POA: Diagnosis not present

## 2017-03-20 DIAGNOSIS — Z9181 History of falling: Secondary | ICD-10-CM | POA: Diagnosis not present

## 2017-03-20 DIAGNOSIS — M6281 Muscle weakness (generalized): Secondary | ICD-10-CM | POA: Diagnosis not present

## 2017-03-20 DIAGNOSIS — R54 Age-related physical debility: Secondary | ICD-10-CM | POA: Diagnosis not present

## 2017-03-20 DIAGNOSIS — Z8701 Personal history of pneumonia (recurrent): Secondary | ICD-10-CM | POA: Diagnosis not present

## 2017-03-20 NOTE — Telephone Encounter (Signed)
Return call made to [facility] w/[facility] Pt was discharged from hospital to SNF [facility]) on 02/21/17- Pt now home since  03/13/2017- and could benefit from Home PT/ OT eval, Social Work eval, and Home Health aide.  Nurse is requesting a verbal order-veral order given, will send to pcp to make sure he is in agreement.Marland KitchenMarland KitchenDespina Hidden Cassady1/8/20193:57 PM

## 2017-03-20 NOTE — Telephone Encounter (Signed)
Jenni with [facility] requesting VO. Please call back.

## 2017-03-21 ENCOUNTER — Telehealth: Payer: Self-pay

## 2017-03-21 NOTE — Telephone Encounter (Signed)
[name] with [facility] requesting VO. Please call pt back.

## 2017-03-22 ENCOUNTER — Telehealth: Payer: Self-pay

## 2017-03-22 NOTE — Telephone Encounter (Signed)
Agree with VO

## 2017-03-22 NOTE — Telephone Encounter (Signed)
Home Health is calling back waiting on verbal order

## 2017-03-22 NOTE — Telephone Encounter (Signed)
Michelle with [facility] requesting VO. Please call back.

## 2017-03-23 ENCOUNTER — Telehealth: Payer: Self-pay | Admitting: *Deleted

## 2017-03-23 NOTE — Telephone Encounter (Signed)
Call from [place], [facility] - stated medical social work eval was postponed until next week per family's request; daughter unable to be at the home today but wants to be there when eval is done. Told her I would inform Dr Heber [place].

## 2017-03-23 NOTE — Telephone Encounter (Signed)
Ok thank you 

## 2017-03-23 NOTE — Telephone Encounter (Signed)
PT calls and states daughter has postponed the start of PT today because pt is overwhelmed with all the home visits going on, will start Monday. Just an [place]

## 2017-03-27 DIAGNOSIS — Z87440 Personal history of urinary (tract) infections: Secondary | ICD-10-CM | POA: Diagnosis not present

## 2017-03-27 DIAGNOSIS — Z8673 Personal history of transient ischemic attack (TIA), and cerebral infarction without residual deficits: Secondary | ICD-10-CM | POA: Diagnosis not present

## 2017-03-27 DIAGNOSIS — Z8701 Personal history of pneumonia (recurrent): Secondary | ICD-10-CM | POA: Diagnosis not present

## 2017-03-27 DIAGNOSIS — Z9181 History of falling: Secondary | ICD-10-CM | POA: Diagnosis not present

## 2017-03-27 DIAGNOSIS — R296 Repeated falls: Secondary | ICD-10-CM | POA: Diagnosis not present

## 2017-03-27 DIAGNOSIS — Z7901 Long term (current) use of anticoagulants: Secondary | ICD-10-CM | POA: Diagnosis not present

## 2017-03-27 DIAGNOSIS — M6281 Muscle weakness (generalized): Secondary | ICD-10-CM | POA: Diagnosis not present

## 2017-03-27 DIAGNOSIS — R270 Ataxia, unspecified: Secondary | ICD-10-CM | POA: Diagnosis not present

## 2017-03-27 DIAGNOSIS — R54 Age-related physical debility: Secondary | ICD-10-CM | POA: Diagnosis not present

## 2017-03-29 ENCOUNTER — Telehealth: Payer: Self-pay | Admitting: Internal Medicine

## 2017-03-29 DIAGNOSIS — R270 Ataxia, unspecified: Secondary | ICD-10-CM | POA: Diagnosis not present

## 2017-03-29 DIAGNOSIS — R296 Repeated falls: Secondary | ICD-10-CM | POA: Diagnosis not present

## 2017-03-29 DIAGNOSIS — Z8673 Personal history of transient ischemic attack (TIA), and cerebral infarction without residual deficits: Secondary | ICD-10-CM | POA: Diagnosis not present

## 2017-03-29 DIAGNOSIS — Z7901 Long term (current) use of anticoagulants: Secondary | ICD-10-CM | POA: Diagnosis not present

## 2017-03-29 DIAGNOSIS — Z8701 Personal history of pneumonia (recurrent): Secondary | ICD-10-CM | POA: Diagnosis not present

## 2017-03-29 DIAGNOSIS — Z9181 History of falling: Secondary | ICD-10-CM | POA: Diagnosis not present

## 2017-03-29 DIAGNOSIS — R54 Age-related physical debility: Secondary | ICD-10-CM | POA: Diagnosis not present

## 2017-03-29 DIAGNOSIS — Z87440 Personal history of urinary (tract) infections: Secondary | ICD-10-CM | POA: Diagnosis not present

## 2017-03-29 DIAGNOSIS — M6281 Muscle weakness (generalized): Secondary | ICD-10-CM | POA: Diagnosis not present

## 2017-03-29 NOTE — Telephone Encounter (Signed)
TOC HFU after SNF D/C 03/13/2017 per patient's caregiver's ACC Appt on 04/05/2017.

## 2017-03-30 DIAGNOSIS — R296 Repeated falls: Secondary | ICD-10-CM | POA: Diagnosis not present

## 2017-03-30 DIAGNOSIS — Z9181 History of falling: Secondary | ICD-10-CM | POA: Diagnosis not present

## 2017-03-30 DIAGNOSIS — M6281 Muscle weakness (generalized): Secondary | ICD-10-CM | POA: Diagnosis not present

## 2017-03-30 DIAGNOSIS — Z8673 Personal history of transient ischemic attack (TIA), and cerebral infarction without residual deficits: Secondary | ICD-10-CM | POA: Diagnosis not present

## 2017-03-30 DIAGNOSIS — R270 Ataxia, unspecified: Secondary | ICD-10-CM | POA: Diagnosis not present

## 2017-03-30 DIAGNOSIS — R54 Age-related physical debility: Secondary | ICD-10-CM | POA: Diagnosis not present

## 2017-03-30 DIAGNOSIS — Z7901 Long term (current) use of anticoagulants: Secondary | ICD-10-CM | POA: Diagnosis not present

## 2017-03-30 DIAGNOSIS — Z8701 Personal history of pneumonia (recurrent): Secondary | ICD-10-CM | POA: Diagnosis not present

## 2017-03-30 DIAGNOSIS — Z87440 Personal history of urinary (tract) infections: Secondary | ICD-10-CM | POA: Diagnosis not present

## 2017-04-03 NOTE — Telephone Encounter (Signed)
Transition Care Management Follow-up Telephone Call   Date discharged? 02/21/17 from hosp. SNF d/c 03/13/17   How have you been since you were released from the hospital? (Spoke to patient's dtr & caretaker) Patient is doing well    Do you understand why you were in the hospital? Yes   Do you understand the discharge instructions? yes   Where were you discharged to? [facility] (assisted living facility)   Items Reviewed:  Medications reviewed: yes  Allergies reviewed: no  Dietary changes reviewed: yes  Referrals reviewed: no   Functional Questionnaire:   Activities of Daily Living (ADLs):   He states they are independent in the following: per daughter, patient is independent on some level States they require assistance with the following: assistance with transfers   Any transportation issues/concerns?: no   Any patient concerns? Per dtr, patient was a little weak last night but is now better   Confirmed importance and date/time of follow-up visits scheduled. yes  Provider Appointment booked with PCP 04/05/17 @ 10:45 (dtr aware & will bring patient)  Confirmed with patient if condition begins to worsen call PCP or go to the ER.  Patient was given the office number and encouraged to call back with question or concerns.  : yes

## 2017-04-04 ENCOUNTER — Encounter: Payer: Self-pay | Admitting: Internal Medicine

## 2017-04-04 NOTE — Progress Notes (Signed)
Subjective:  HPI: [name] is a 84 y.o. male who presents for f/u of HTN post discharge from SNF  Please see Assessment and Plan below for the status of his chronic medical problems.  Review of Systems: Review of Systems  Constitutional: Negative for fever.  Respiratory: Negative for cough and shortness of breath.   Cardiovascular: Negative for chest pain.  Gastrointestinal: Negative for abdominal pain.  Neurological: Negative for dizziness and focal weakness.    Objective:  Physical Exam: Vitals:   04/05/17 1114  BP: (!) 150/87  Pulse: (!) 59  Temp: 98 F (36.7 C)  TempSrc: Oral  SpO2: 97%  Weight: 203 lb 9.6 oz (92.4 kg)  Height: 5\' 9"  (1.753 m)   Physical Exam  Constitutional: He is well-developed, well-nourished, and in no distress. No distress.  In wheelchair  HENT:  Head: Normocephalic and atraumatic.  HOH  Eyes: Conjunctivae are normal.  Cardiovascular: Normal rate, regular rhythm, normal heart sounds and intact distal pulses.  No murmur heard. Pulmonary/Chest: Effort normal and breath sounds normal. No respiratory distress. He has no wheezes. He has no rales.  Abdominal: Soft. Bowel sounds are normal. He exhibits no distension. There is no tenderness.  Musculoskeletal: He exhibits no edema.  Neurological:  Unsteady standing from wheelchair, gait or Romberg no assessed due to safety concerns. Strength in b/l upper and lower extremities grossly 5/5  Skin: Skin is warm and dry. He is not diaphoretic.  Psychiatric: Affect and judgment normal.  Nursing note and vitals reviewed.  Assessment & Plan:  HTN (hypertension) HPI: no complaints.  A: Essential HTN, goal <150.90  P: resonably controlled continue: Irbesartan 300mg  daily Amlodipine5 mg daily Metoprolol> change to succinate 50mg  daily (if not affordable will continue tartrate at 25mg  BID)  Cerebellar ataxia in diseases classified elsewhere [facility]) HPI: Gait unsteady, requires assistance from  daughter for ADL: dressing, bathing, ambulating. 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 rivaroxaban (XARELTO) 10 MG TABS tablet    Sig: Take 1 tablet (10 mg total) by mouth daily.    Dispense:  90 tablet    Refill:  3  . irbesartan (AVAPRO) 300 MG tablet    Sig: Take 1 tablet (300 mg total) by mouth daily.    Dispense:  90 tablet    Refill:  3  . atorvastatin (LIPITOR) 20 MG tablet    Sig: Take 1 tablet (20 mg total) by mouth daily at 6 PM.    Dispense:  90 tablet    Refill:  3  . amLODipine (NORVASC) 5 MG tablet    Sig: Take 1 tablet (5 mg total) by mouth daily.    Dispense:  90 tablet    Refill:  3  . metoprolol succinate (TOPROL XL) 50 MG 24 hr tablet    Sig: Take 1 tablet (50 mg total) by mouth daily. Take with or immediately following a meal.    Dispense:  90 tablet    Refill:  3   Other Orders Orders Placed This Encounter  Procedures  . For home use only DME Tub bench  . For home use only DME standard manual wheelchair with seat cushion    Patient suffers from ataxia due to history of CVA which impairs their ability to perform daily activities like  bathing, dressing, grooming and toileting in the home.  A walker will not resolve  issue with performing activities of daily living. A wheelchair will allow patient to safely perform daily activities. Patient can safely propel the wheelchair in the home or has a caregiver who can provide assistance.  Accessories: elevating leg rests (ELRs), wheel locks, extensions and anti-tippers.  . Ambulatory referral to [place]    Referral Priority:   Routine    Referral Type:   [place]    Referral Reason:   Specialty Services Required    Requested Specialty:   [place]    Number of Visits Requested:   1   Follow  Up: Return in about 3 months (around 07/04/2017).

## 2017-04-05 ENCOUNTER — Ambulatory Visit (INDEPENDENT_AMBULATORY_CARE_PROVIDER_SITE_OTHER): Payer: Medicare HMO | Admitting: Internal Medicine

## 2017-04-05 ENCOUNTER — Encounter: Payer: Self-pay | Admitting: Internal Medicine

## 2017-04-05 ENCOUNTER — Other Ambulatory Visit: Payer: Self-pay

## 2017-04-05 VITALS — BP 150/87 | HR 59 | Temp 98.0°F | Ht 69.0 in | Wt 203.6 lb

## 2017-04-05 DIAGNOSIS — R269 Unspecified abnormalities of gait and mobility: Secondary | ICD-10-CM

## 2017-04-05 DIAGNOSIS — I69393 Ataxia following cerebral infarction: Secondary | ICD-10-CM

## 2017-04-05 DIAGNOSIS — I10 Essential (primary) hypertension: Secondary | ICD-10-CM | POA: Diagnosis not present

## 2017-04-05 DIAGNOSIS — Z86718 Personal history of other venous thrombosis and embolism: Secondary | ICD-10-CM

## 2017-04-05 DIAGNOSIS — Z79899 Other long term (current) drug therapy: Secondary | ICD-10-CM | POA: Diagnosis not present

## 2017-04-05 DIAGNOSIS — D329 Benign neoplasm of meninges, unspecified: Secondary | ICD-10-CM

## 2017-04-05 DIAGNOSIS — G3281 Cerebellar ataxia in diseases classified elsewhere: Secondary | ICD-10-CM

## 2017-04-05 DIAGNOSIS — I69993 Ataxia following unspecified cerebrovascular disease: Secondary | ICD-10-CM

## 2017-04-05 MED ORDER — ATORVASTATIN CALCIUM 20 MG PO TABS: 20.0000 mg | ORAL_TABLET | Freq: Every day | ORAL | 3 refills | Status: DC

## 2017-04-05 MED ORDER — IRBESARTAN 300 MG PO TABS: 300.0000 mg | ORAL_TABLET | Freq: Every day | ORAL | 3 refills | Status: DC

## 2017-04-05 MED ORDER — AMLODIPINE BESYLATE 5 MG PO TABS: 5.0000 mg | ORAL_TABLET | Freq: Every day | ORAL | 3 refills | Status: DC

## 2017-04-05 MED ORDER — RIVAROXABAN 10 MG PO TABS: 10.0000 mg | ORAL_TABLET | Freq: Every day | ORAL | 3 refills | Status: DC

## 2017-04-05 MED ORDER — METOPROLOL SUCCINATE ER 50 MG PO TB24: 50.0000 mg | ORAL_TABLET | Freq: Every day | ORAL | 3 refills | Status: DC

## 2017-04-06 NOTE — Assessment & Plan Note (Signed)
HPI: no complaints.  A: Essential HTN, goal <150.90  P: resonably controlled continue: Irbesartan 300mg  daily Amlodipine5 mg daily Metoprolol> change to succinate 50mg  daily (if not affordable will continue tartrate at 25mg  BID)

## 2017-04-06 NOTE — Assessment & Plan Note (Signed)
HPI: Gait unsteady, requires assistance from daughter for ADL: dressing, bathing, ambulating. 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

## 2017-04-10 ENCOUNTER — Telehealth: Payer: Self-pay

## 2017-04-10 ENCOUNTER — Telehealth: Payer: Self-pay | Admitting: *Deleted

## 2017-04-10 DIAGNOSIS — I69393 Ataxia following cerebral infarction: Secondary | ICD-10-CM | POA: Diagnosis not present

## 2017-04-10 DIAGNOSIS — I10 Essential (primary) hypertension: Secondary | ICD-10-CM | POA: Diagnosis not present

## 2017-04-10 DIAGNOSIS — I739 Peripheral vascular disease, unspecified: Secondary | ICD-10-CM | POA: Diagnosis not present

## 2017-04-10 DIAGNOSIS — D329 Benign neoplasm of meninges, unspecified: Secondary | ICD-10-CM | POA: Diagnosis not present

## 2017-04-10 DIAGNOSIS — Z7901 Long term (current) use of anticoagulants: Secondary | ICD-10-CM | POA: Diagnosis not present

## 2017-04-10 DIAGNOSIS — F0390 Unspecified dementia without behavioral disturbance: Secondary | ICD-10-CM | POA: Diagnosis not present

## 2017-04-10 NOTE — Telephone Encounter (Signed)
Sree with [facility] requesting VO for PT and Home health aid. Please call back .

## 2017-04-10 NOTE — Telephone Encounter (Signed)
Call from pt's dtr-patient now using  mail order pharmacy and have not received medications yet.  Pt going on 4 days without atorvastatin  (all other meds are ok).  I contacted [facility] and was informed that medications were shipped out on 01/25 and it usually takes 3-5 business days for delivery.  Pt's family would like to know if its ok to wait for medication to arrive or if they need to have rx sent to local pharmacy and pay for it out of pocket.  Pt dtr concerned about missing doses causing harm to patient.  Will send info to pcp for review, please advise.Regenia Skeeter, Ida Uppal Cassady1/29/201911:17 AM

## 2017-04-13 NOTE — Telephone Encounter (Signed)
Ok to wait for meds to be delivered.

## 2017-04-16 DIAGNOSIS — D329 Benign neoplasm of meninges, unspecified: Secondary | ICD-10-CM | POA: Diagnosis not present

## 2017-04-16 DIAGNOSIS — F0390 Unspecified dementia without behavioral disturbance: Secondary | ICD-10-CM | POA: Diagnosis not present

## 2017-04-16 DIAGNOSIS — I739 Peripheral vascular disease, unspecified: Secondary | ICD-10-CM | POA: Diagnosis not present

## 2017-04-16 DIAGNOSIS — Z7901 Long term (current) use of anticoagulants: Secondary | ICD-10-CM | POA: Diagnosis not present

## 2017-04-16 DIAGNOSIS — I10 Essential (primary) hypertension: Secondary | ICD-10-CM | POA: Diagnosis not present

## 2017-04-16 DIAGNOSIS — I69393 Ataxia following cerebral infarction: Secondary | ICD-10-CM | POA: Diagnosis not present

## 2017-04-19 DIAGNOSIS — I69393 Ataxia following cerebral infarction: Secondary | ICD-10-CM | POA: Diagnosis not present

## 2017-04-19 DIAGNOSIS — D329 Benign neoplasm of meninges, unspecified: Secondary | ICD-10-CM | POA: Diagnosis not present

## 2017-04-19 DIAGNOSIS — Z7901 Long term (current) use of anticoagulants: Secondary | ICD-10-CM | POA: Diagnosis not present

## 2017-04-19 DIAGNOSIS — F0390 Unspecified dementia without behavioral disturbance: Secondary | ICD-10-CM | POA: Diagnosis not present

## 2017-04-19 DIAGNOSIS — I739 Peripheral vascular disease, unspecified: Secondary | ICD-10-CM | POA: Diagnosis not present

## 2017-04-19 DIAGNOSIS — I10 Essential (primary) hypertension: Secondary | ICD-10-CM | POA: Diagnosis not present

## 2017-04-21 DIAGNOSIS — D329 Benign neoplasm of meninges, unspecified: Secondary | ICD-10-CM | POA: Diagnosis not present

## 2017-04-21 DIAGNOSIS — F0390 Unspecified dementia without behavioral disturbance: Secondary | ICD-10-CM | POA: Diagnosis not present

## 2017-04-21 DIAGNOSIS — Z7901 Long term (current) use of anticoagulants: Secondary | ICD-10-CM | POA: Diagnosis not present

## 2017-04-21 DIAGNOSIS — I739 Peripheral vascular disease, unspecified: Secondary | ICD-10-CM | POA: Diagnosis not present

## 2017-04-21 DIAGNOSIS — I69393 Ataxia following cerebral infarction: Secondary | ICD-10-CM | POA: Diagnosis not present

## 2017-04-21 DIAGNOSIS — I10 Essential (primary) hypertension: Secondary | ICD-10-CM | POA: Diagnosis not present

## 2017-04-23 DIAGNOSIS — F0390 Unspecified dementia without behavioral disturbance: Secondary | ICD-10-CM | POA: Diagnosis not present

## 2017-04-23 DIAGNOSIS — Z7901 Long term (current) use of anticoagulants: Secondary | ICD-10-CM | POA: Diagnosis not present

## 2017-04-23 DIAGNOSIS — I10 Essential (primary) hypertension: Secondary | ICD-10-CM | POA: Diagnosis not present

## 2017-04-23 DIAGNOSIS — I69393 Ataxia following cerebral infarction: Secondary | ICD-10-CM | POA: Diagnosis not present

## 2017-04-23 DIAGNOSIS — I739 Peripheral vascular disease, unspecified: Secondary | ICD-10-CM | POA: Diagnosis not present

## 2017-04-23 DIAGNOSIS — D329 Benign neoplasm of meninges, unspecified: Secondary | ICD-10-CM | POA: Diagnosis not present

## 2017-04-25 DIAGNOSIS — Z7901 Long term (current) use of anticoagulants: Secondary | ICD-10-CM | POA: Diagnosis not present

## 2017-04-25 DIAGNOSIS — D329 Benign neoplasm of meninges, unspecified: Secondary | ICD-10-CM | POA: Diagnosis not present

## 2017-04-25 DIAGNOSIS — I69393 Ataxia following cerebral infarction: Secondary | ICD-10-CM | POA: Diagnosis not present

## 2017-04-25 DIAGNOSIS — I739 Peripheral vascular disease, unspecified: Secondary | ICD-10-CM | POA: Diagnosis not present

## 2017-04-25 DIAGNOSIS — I10 Essential (primary) hypertension: Secondary | ICD-10-CM | POA: Diagnosis not present

## 2017-04-25 DIAGNOSIS — F0390 Unspecified dementia without behavioral disturbance: Secondary | ICD-10-CM | POA: Diagnosis not present

## 2017-05-01 DIAGNOSIS — F0390 Unspecified dementia without behavioral disturbance: Secondary | ICD-10-CM | POA: Diagnosis not present

## 2017-05-01 DIAGNOSIS — Z7901 Long term (current) use of anticoagulants: Secondary | ICD-10-CM | POA: Diagnosis not present

## 2017-05-01 DIAGNOSIS — D329 Benign neoplasm of meninges, unspecified: Secondary | ICD-10-CM | POA: Diagnosis not present

## 2017-05-01 DIAGNOSIS — I10 Essential (primary) hypertension: Secondary | ICD-10-CM | POA: Diagnosis not present

## 2017-05-01 DIAGNOSIS — I739 Peripheral vascular disease, unspecified: Secondary | ICD-10-CM | POA: Diagnosis not present

## 2017-05-01 DIAGNOSIS — I69393 Ataxia following cerebral infarction: Secondary | ICD-10-CM | POA: Diagnosis not present

## 2017-05-03 DIAGNOSIS — F0390 Unspecified dementia without behavioral disturbance: Secondary | ICD-10-CM | POA: Diagnosis not present

## 2017-05-03 DIAGNOSIS — I69393 Ataxia following cerebral infarction: Secondary | ICD-10-CM | POA: Diagnosis not present

## 2017-05-03 DIAGNOSIS — D329 Benign neoplasm of meninges, unspecified: Secondary | ICD-10-CM | POA: Diagnosis not present

## 2017-05-03 DIAGNOSIS — I10 Essential (primary) hypertension: Secondary | ICD-10-CM | POA: Diagnosis not present

## 2017-05-03 DIAGNOSIS — Z7901 Long term (current) use of anticoagulants: Secondary | ICD-10-CM | POA: Diagnosis not present

## 2017-05-03 DIAGNOSIS — I739 Peripheral vascular disease, unspecified: Secondary | ICD-10-CM | POA: Diagnosis not present

## 2017-05-04 DIAGNOSIS — I10 Essential (primary) hypertension: Secondary | ICD-10-CM | POA: Diagnosis not present

## 2017-05-04 DIAGNOSIS — D329 Benign neoplasm of meninges, unspecified: Secondary | ICD-10-CM | POA: Diagnosis not present

## 2017-05-04 DIAGNOSIS — F0390 Unspecified dementia without behavioral disturbance: Secondary | ICD-10-CM | POA: Diagnosis not present

## 2017-05-04 DIAGNOSIS — I739 Peripheral vascular disease, unspecified: Secondary | ICD-10-CM | POA: Diagnosis not present

## 2017-05-04 DIAGNOSIS — I69393 Ataxia following cerebral infarction: Secondary | ICD-10-CM | POA: Diagnosis not present

## 2017-05-04 DIAGNOSIS — Z7901 Long term (current) use of anticoagulants: Secondary | ICD-10-CM | POA: Diagnosis not present

## 2017-05-09 DIAGNOSIS — D329 Benign neoplasm of meninges, unspecified: Secondary | ICD-10-CM | POA: Diagnosis not present

## 2017-05-09 DIAGNOSIS — F0390 Unspecified dementia without behavioral disturbance: Secondary | ICD-10-CM | POA: Diagnosis not present

## 2017-05-09 DIAGNOSIS — I10 Essential (primary) hypertension: Secondary | ICD-10-CM | POA: Diagnosis not present

## 2017-05-09 DIAGNOSIS — I739 Peripheral vascular disease, unspecified: Secondary | ICD-10-CM | POA: Diagnosis not present

## 2017-05-09 DIAGNOSIS — Z7901 Long term (current) use of anticoagulants: Secondary | ICD-10-CM | POA: Diagnosis not present

## 2017-05-09 DIAGNOSIS — I69393 Ataxia following cerebral infarction: Secondary | ICD-10-CM | POA: Diagnosis not present

## 2017-05-16 DIAGNOSIS — D329 Benign neoplasm of meninges, unspecified: Secondary | ICD-10-CM | POA: Diagnosis not present

## 2017-05-16 DIAGNOSIS — I10 Essential (primary) hypertension: Secondary | ICD-10-CM | POA: Diagnosis not present

## 2017-05-16 DIAGNOSIS — I739 Peripheral vascular disease, unspecified: Secondary | ICD-10-CM | POA: Diagnosis not present

## 2017-05-16 DIAGNOSIS — I69393 Ataxia following cerebral infarction: Secondary | ICD-10-CM | POA: Diagnosis not present

## 2017-05-16 DIAGNOSIS — Z7901 Long term (current) use of anticoagulants: Secondary | ICD-10-CM | POA: Diagnosis not present

## 2017-05-16 DIAGNOSIS — F0390 Unspecified dementia without behavioral disturbance: Secondary | ICD-10-CM | POA: Diagnosis not present

## 2017-05-24 DIAGNOSIS — I69393 Ataxia following cerebral infarction: Secondary | ICD-10-CM | POA: Diagnosis not present

## 2017-05-24 DIAGNOSIS — Z7901 Long term (current) use of anticoagulants: Secondary | ICD-10-CM | POA: Diagnosis not present

## 2017-05-24 DIAGNOSIS — F0390 Unspecified dementia without behavioral disturbance: Secondary | ICD-10-CM | POA: Diagnosis not present

## 2017-05-24 DIAGNOSIS — I739 Peripheral vascular disease, unspecified: Secondary | ICD-10-CM | POA: Diagnosis not present

## 2017-05-24 DIAGNOSIS — I10 Essential (primary) hypertension: Secondary | ICD-10-CM | POA: Diagnosis not present

## 2017-05-24 DIAGNOSIS — D329 Benign neoplasm of meninges, unspecified: Secondary | ICD-10-CM | POA: Diagnosis not present

## 2017-05-30 DIAGNOSIS — Z7901 Long term (current) use of anticoagulants: Secondary | ICD-10-CM | POA: Diagnosis not present

## 2017-05-30 DIAGNOSIS — D329 Benign neoplasm of meninges, unspecified: Secondary | ICD-10-CM | POA: Diagnosis not present

## 2017-05-30 DIAGNOSIS — I739 Peripheral vascular disease, unspecified: Secondary | ICD-10-CM | POA: Diagnosis not present

## 2017-05-30 DIAGNOSIS — F0390 Unspecified dementia without behavioral disturbance: Secondary | ICD-10-CM | POA: Diagnosis not present

## 2017-05-30 DIAGNOSIS — I10 Essential (primary) hypertension: Secondary | ICD-10-CM | POA: Diagnosis not present

## 2017-05-30 DIAGNOSIS — I69393 Ataxia following cerebral infarction: Secondary | ICD-10-CM | POA: Diagnosis not present

## 2017-10-03 ENCOUNTER — Encounter: Payer: Self-pay | Admitting: Internal Medicine

## 2017-10-03 ENCOUNTER — Other Ambulatory Visit: Payer: Self-pay

## 2017-10-03 ENCOUNTER — Ambulatory Visit (INDEPENDENT_AMBULATORY_CARE_PROVIDER_SITE_OTHER): Payer: Medicare HMO | Admitting: Internal Medicine

## 2017-10-03 ENCOUNTER — Ambulatory Visit (HOSPITAL_COMMUNITY)
Admission: RE | Admit: 2017-10-03 | Discharge: 2017-10-03 | Disposition: A | Discharge status: Home / Self Care | Payer: Medicare HMO | Source: Ambulatory Visit | Attending: Internal Medicine | Admitting: Internal Medicine

## 2017-10-03 VITALS — BP 146/76 | HR 72 | Temp 97.6°F | Wt 210.0 lb

## 2017-10-03 DIAGNOSIS — R05 Cough: Secondary | ICD-10-CM | POA: Diagnosis not present

## 2017-10-03 DIAGNOSIS — Z79899 Other long term (current) drug therapy: Secondary | ICD-10-CM

## 2017-10-03 DIAGNOSIS — L509 Urticaria, unspecified: Secondary | ICD-10-CM | POA: Diagnosis not present

## 2017-10-03 DIAGNOSIS — Z8249 Family history of ischemic heart disease and other diseases of the circulatory system: Secondary | ICD-10-CM | POA: Diagnosis not present

## 2017-10-03 DIAGNOSIS — I10 Essential (primary) hypertension: Secondary | ICD-10-CM | POA: Diagnosis not present

## 2017-10-03 DIAGNOSIS — R0602 Shortness of breath: Secondary | ICD-10-CM | POA: Diagnosis not present

## 2017-10-03 DIAGNOSIS — I517 Cardiomegaly: Secondary | ICD-10-CM | POA: Insufficient documentation

## 2017-10-03 DIAGNOSIS — Z87891 Personal history of nicotine dependence: Secondary | ICD-10-CM | POA: Diagnosis not present

## 2017-10-03 DIAGNOSIS — J9811 Atelectasis: Secondary | ICD-10-CM | POA: Diagnosis not present

## 2017-10-03 DIAGNOSIS — R0601 Orthopnea: Secondary | ICD-10-CM

## 2017-10-03 DIAGNOSIS — H9193 Unspecified hearing loss, bilateral: Secondary | ICD-10-CM

## 2017-10-03 LAB — BASIC METABOLIC PANEL
Anion gap: 6 (ref 5–15)
BUN: 16 mg/dL (ref 8–23)
CO2: 32 mmol/L (ref 22–32)
Calcium: 10 mg/dL (ref 8.9–10.3)
Chloride: 106 mmol/L (ref 98–111)
Creatinine, Ser: 1.26 mg/dL — ABNORMAL HIGH (ref 0.61–1.24)
GFR calc Af Amer: 56 mL/min — ABNORMAL LOW (ref >60)
GFR calc non Af Amer: 48 mL/min — ABNORMAL LOW (ref >60)
Glucose, Bld: 139 mg/dL — ABNORMAL HIGH (ref 70–99)
Potassium: 4.3 mmol/L (ref 3.5–5.1)
Sodium: 144 mmol/L (ref 135–145)

## 2017-10-03 LAB — BRAIN NATRIURETIC PEPTIDE: B Natriuretic Peptide: 130.2 pg/mL — ABNORMAL HIGH (ref 0.0–100.0)

## 2017-10-03 MED ORDER — TRIAMCINOLONE ACETONIDE 0.5 % EX OINT: 1.0000 "application " | TOPICAL_OINTMENT | Freq: Two times a day (BID) | CUTANEOUS | 0 refills | Status: DC

## 2017-10-03 NOTE — Assessment & Plan Note (Addendum)
BP Readings from Last 3 Encounters:  10/03/17 (!) 146/76  04/05/17 (!) 150/87  02/21/17 136/78   - Patient's bp elevated but within goal of 150/90 (has hx of orthostatic hypotension) - Currently on irbesartan 300mg , amlodipine 5mg , metoprolol succinate 50mg  daily - With new symptoms of shortness of breath on exertion. Also has trace pitting edema - Will check bmp today - C/w current regimen

## 2017-10-03 NOTE — Assessment & Plan Note (Signed)
-   Two 3-cm wheals on both shins with pruritus - Both located right above the sock line - Patient spends time outside on his wheelchair - No history of trauma, new detergent, new socks or pants  - Most likely 2/2 bug bites - Start Kenalog ointment for symptom relief

## 2017-10-03 NOTE — Patient Instructions (Signed)
[name]  We saw and evaluated you for shortness of breath and rash on your legs. We are sending an ointment for the leg. Please use as directed and don't touch your face. Also we are sending you for some chest X-ray and labs to determine the cause of your shortness of breath. We will call you with the results of the test after they are completed. Thank you for visiting the clinic.  -Gilberto Better, MD

## 2017-10-03 NOTE — Assessment & Plan Note (Signed)
-   Patient with worsening shortness of breath on exertion more severe during last 2~3 months - Minimal exertion, such as bending over to put on his shoes, cause symptomatic shortness of breath - Also endorsing dry cough - Satting 94% on room air today - Last Echo in 2015: Moderate LVH with EF 35-82% grade 1 diastolic dysfunction - No history of asthma or COPD - 10 pack year smoking history but quit over 60 years ago - Mild wheezing on exam but no obvious crackles or rales  - Shortness of breath on exertion 2/2 asthma, COPD, Congestive heart failure - CHF most likely as patient also has family hx of heart disease and had LVH on echo in 2015 - Will send for Chest X-ray - BNP on blood test today - If positive, will need repeat echo and possibly start diuretics

## 2017-10-03 NOTE — Progress Notes (Signed)
CC: Shortness of breath  HPI: [name] is a 84 y.o.M presenting to the clinic with his daughter([name]), who is his primary caretaker, for shortness of breath on exertion and new lesions on his legs. Patient has bilateral hearing loss and most of his history was taken from his daughter. She states over the last 2~3 months she has noticed that patient becomes fatigued and short of breath with minimal exertion, including low effort activity such as bending over to tie his shoes. She states he can stand and walk on his own but he grows quickly short of breath when he attempts. He has also developed intermittent dry cough without sputum production. He has a medical bed which she keeps inclined for his comfort but she cannot confirm if he is intolerant to lying flat. She has not noticed any increase in lower extremity swelling. Denies any chest pain / palpitations / loss of consciousness.She expresses concerns as patient had prior hospitalizations for new pneumonia. Denies any F/N/V/D/C.  [name] also has new onset lesions on both of his legs. Both are located on his mid shin. His daughter does not recall any inciting event or trauma but has noticed them since 2 days ago. Patient expresses pruritus and irritation but denies any bleeding, purulent drainage, vesicles or ulcers.  Past Medical History:  Diagnosis Date  . Anemia   . BPH (benign prostatic hypertrophy)    TURP 05/19/13  . Cerebral embolism with cerebral infarction ([place]) 12/19/2013  . Chronic kidney disease    CHRONIC KIDNEY DISEASE, 2  . Difficulty hearing, right    BILATERAL HEARING LOSS - BEST TO TRY TO SPEAK INTO LEFT EAR  . DVT (deep venous thrombosis) ([place])   . Frequent falls   . Hyperlipidemia   . Hypertension   . Incontinence of urine    SOME INCONTINENCE  . Left adrenal mass ([place]) 09/11/2013  . Meningioma ([place])   . Stroke ([place])    Cerebellar, 2013; WALKS WITH WALKER, ABLE TO DRESS AND BATHE HIMSELF BUT  FAMILY TRIES TO PROVIDE SUPERVISION BECAUSE OF HIS HX OF FALL AND WEAKNESS LEGS, ARMS   . Thrombocytopenia ([place])   . Vitreous hemorrhage ([place]) 12/21/2013   OS 12/18/13 CT /MRI brain done for ataxia    Review of Systems: Review of Systems  Constitutional: Positive for malaise/fatigue. Negative for chills, fever and weight loss.  Respiratory: Positive for cough and shortness of breath. Negative for hemoptysis and sputum production.   Cardiovascular: Positive for orthopnea. Negative for chest pain, palpitations and leg swelling.  Gastrointestinal: Negative for constipation, diarrhea, nausea and vomiting.  Neurological: Negative for dizziness, tremors, sensory change, weakness and headaches.    Physical Exam: Vitals:   10/03/17 1421  BP: (!) 146/76  Pulse: 72  Temp: 97.6 F (36.4 C)  TempSrc: Oral  SpO2: 94%  Weight: 210 lb (95.3 kg)   Physical Exam  Constitutional: He appears well-developed and well-nourished. No distress.  Cardiovascular: Normal rate, regular rhythm, normal heart sounds and intact distal pulses. Exam reveals no gallop.  No murmur heard. Respiratory: Effort normal. No respiratory distress. He has wheezes. He has no rales.  Musculoskeletal: Normal range of motion. He exhibits edema (trace pitting edema). He exhibits no tenderness or deformity.  Skin: Skin is warm and dry. No pallor.  ~3cm diameter wheals on bilateral mid-shins with erythema and warmth. No bleeding, purulent drainage, ulcers      Assessment & Plan:   See Encounters Tab for problem based charting.  Patient seen with Dr. Daryll Drown   -Gilberto Better, PGY1

## 2017-10-04 ENCOUNTER — Telehealth: Payer: Self-pay | Admitting: Internal Medicine

## 2017-10-04 DIAGNOSIS — I10 Essential (primary) hypertension: Secondary | ICD-10-CM

## 2017-10-04 MED ORDER — FUROSEMIDE 20 MG PO TABS: 20.0000 mg | ORAL_TABLET | Freq: Two times a day (BID) | ORAL | 0 refills | Status: DC

## 2017-10-04 NOTE — Telephone Encounter (Signed)
Spoke with patient's daughter on her mobile number about patient's Chest X-ray and BNP results. Informed that prescription for Lasix will be sent over to his local pharmacy. Asked pt's daughter to schedule a follow up visit as soon as possible to assess his tolerance to Lasix, his shortness of breath, and setting up time for echo.

## 2017-10-11 NOTE — Progress Notes (Signed)
Internal Medicine Clinic Attending  I saw and evaluated the patient.  I personally confirmed the key portions of the history and exam documented by Dr. Lee and I reviewed pertinent patient test results.  The assessment, diagnosis, and plan were formulated together and I agree with the documentation in the resident's note.  

## 2017-10-25 ENCOUNTER — Ambulatory Visit (INDEPENDENT_AMBULATORY_CARE_PROVIDER_SITE_OTHER): Payer: Medicare HMO | Admitting: Internal Medicine

## 2017-10-25 ENCOUNTER — Encounter: Payer: Self-pay | Admitting: Internal Medicine

## 2017-10-25 ENCOUNTER — Other Ambulatory Visit: Payer: Self-pay

## 2017-10-25 VITALS — BP 171/89 | HR 61 | Temp 97.8°F | Ht 69.0 in | Wt 213.3 lb

## 2017-10-25 DIAGNOSIS — I110 Hypertensive heart disease with heart failure: Secondary | ICD-10-CM

## 2017-10-25 DIAGNOSIS — N281 Cyst of kidney, acquired: Secondary | ICD-10-CM

## 2017-10-25 DIAGNOSIS — Z86718 Personal history of other venous thrombosis and embolism: Secondary | ICD-10-CM

## 2017-10-25 DIAGNOSIS — Z79899 Other long term (current) drug therapy: Secondary | ICD-10-CM | POA: Diagnosis not present

## 2017-10-25 DIAGNOSIS — Z7901 Long term (current) use of anticoagulants: Secondary | ICD-10-CM | POA: Diagnosis not present

## 2017-10-25 DIAGNOSIS — I10 Essential (primary) hypertension: Secondary | ICD-10-CM

## 2017-10-25 DIAGNOSIS — I509 Heart failure, unspecified: Secondary | ICD-10-CM | POA: Diagnosis not present

## 2017-10-25 LAB — CBC WITH DIFFERENTIAL/PLATELET
Abs Immature Granulocytes: 0 10*3/uL (ref 0.0–0.1)
Basophils Absolute: 0 10*3/uL (ref 0.0–0.1)
Basophils Relative: 0 %
Eosinophils Absolute: 0.4 10*3/uL (ref 0.0–0.7)
Eosinophils Relative: 5 %
HCT: 39 % (ref 39.0–52.0)
Hemoglobin: 11.8 g/dL — ABNORMAL LOW (ref 13.0–17.0)
Immature Granulocytes: 0 %
Lymphocytes Relative: 23 %
Lymphs Abs: 1.6 10*3/uL (ref 0.7–4.0)
MCH: 27.9 pg (ref 26.0–34.0)
MCHC: 30.3 g/dL (ref 30.0–36.0)
MCV: 92.2 fL (ref 78.0–100.0)
Monocytes Absolute: 0.6 10*3/uL (ref 0.1–1.0)
Monocytes Relative: 9 %
Neutro Abs: 4.4 10*3/uL (ref 1.7–7.7)
Neutrophils Relative %: 63 %
Platelets: 146 10*3/uL — ABNORMAL LOW (ref 150–400)
RBC: 4.23 MIL/uL (ref 4.22–5.81)
RDW: 14.9 % (ref 11.5–15.5)
WBC: 6.9 10*3/uL (ref 4.0–10.5)

## 2017-10-25 LAB — BASIC METABOLIC PANEL
Anion gap: 4 — ABNORMAL LOW (ref 5–15)
BUN: 16 mg/dL (ref 8–23)
CO2: 31 mmol/L (ref 22–32)
Calcium: 10 mg/dL (ref 8.9–10.3)
Chloride: 104 mmol/L (ref 98–111)
Creatinine, Ser: 1.3 mg/dL — ABNORMAL HIGH (ref 0.61–1.24)
GFR calc Af Amer: 54 mL/min — ABNORMAL LOW (ref >60)
GFR calc non Af Amer: 47 mL/min — ABNORMAL LOW (ref >60)
Glucose, Bld: 87 mg/dL (ref 70–99)
Potassium: 4.6 mmol/L (ref 3.5–5.1)
Sodium: 139 mmol/L (ref 135–145)

## 2017-10-25 LAB — BRAIN NATRIURETIC PEPTIDE: B Natriuretic Peptide: 229.8 pg/mL — ABNORMAL HIGH (ref 0.0–100.0)

## 2017-10-25 MED ORDER — RIVAROXABAN 10 MG PO TABS: 10.0000 mg | ORAL_TABLET | Freq: Every day | ORAL | 3 refills | Status: DC

## 2017-10-25 MED ORDER — FUROSEMIDE 40 MG PO TABS: 40.0000 mg | ORAL_TABLET | Freq: Two times a day (BID) | ORAL | 1 refills | Status: DC

## 2017-10-25 NOTE — Patient Instructions (Signed)
I want you to go back to taking Lasix however I am increasing to 40mg  twice a day for 2 weeks, after that you can decrease to once a day unless instructed otherwise in the follow up appointment.  I also want you to obtain an Echocardiogram of your heart

## 2017-10-25 NOTE — Assessment & Plan Note (Signed)
HPI: No current complaints, taking irbesartan and amlodipine.  A: Essential HTN not at goal  P: Irbesartan 300mg  daily Amlodipine 5mg  daily Restart lasix at 40mg  BID for 2 weeks then decrease to 40mg  daily

## 2017-10-25 NOTE — Progress Notes (Signed)
Subjective:  HPI: [name] is a 84 y.o. male who presents for f/u of HTN and possible CHF  Please see Assessment and Plan below for the status of his chronic medical problems.  Review of Systems: Review of Systems  Constitutional: Negative for fever.  Respiratory: Negative for cough and shortness of breath.   Cardiovascular: Positive for leg swelling. Negative for chest pain and orthopnea (sleeping at an incline in hospital bed).  Gastrointestinal: Negative for abdominal pain.  Genitourinary: Negative for dysuria and hematuria.  Neurological: Negative for dizziness and focal weakness.    Objective:  Physical Exam: Vitals:   10/25/17 1555  BP: (!) 171/89  Pulse: 61  Temp: 97.8 F (36.6 C)  TempSrc: Oral  SpO2: 94%  Weight: 213 lb 4.8 oz (96.8 kg)  Height: 5\' 9"  (1.753 m)  Body mass index is 31.5 kg/m. Wt Readings from Last 5 Encounters:  10/25/17 213 lb 4.8 oz (96.8 kg)  10/03/17 210 lb (95.3 kg)  04/05/17 203 lb 9.6 oz (92.4 kg)  02/19/17 201 lb 1 oz (91.2 kg)  02/13/17 197 lb 12.8 oz (89.7 kg)    Physical Exam  Constitutional: He is well-developed, well-nourished, and in no distress. No distress.  In wheelchair  HENT:  Head: Normocephalic and atraumatic.  HOH  Eyes: Conjunctivae are normal.  Cardiovascular: Normal rate, regular rhythm, normal heart sounds and intact distal pulses.  No murmur heard. Pulmonary/Chest: Effort normal. No respiratory distress. He has no wheezes. He has rales (bibasilar).  Abdominal: Soft. Bowel sounds are normal. He exhibits no distension. There is no tenderness.  Musculoskeletal: He exhibits edema (1+ bilaterally).  Skin: Skin is warm and dry. He is not diaphoretic.  Psychiatric: Affect and judgment normal.  Nursing note and vitals reviewed. POCUS: Lung exam:No pleural effusions, A lines present, minimal B lines.  During exam noted cysts present on bilateral kidneys. Assessment & Plan:  HTN (hypertension) HPI: No current  complaints, taking irbesartan and amlodipine.  A: Essential HTN not at goal  P: Irbesartan 300mg  daily Amlodipine 5mg  daily Restart lasix at 40mg  BID for 2 weeks then decrease to 40mg  daily  Long term current use of anticoagulant therapy- Xarelto No bleeding, doing well on low dose xarelto.  Will continue Xarelto 10mg  daily  Congestive heart failure (HCC) HPI: Seen in our clinic a few weeks ago, increased DOE and wegiht gain, BNP elevated, was started on Lasix, daughter reports he was taking this and peeing more however never got all the way back to baseline.  He has run out of the lasix and not taken in the last week.  She reports he has some increased swelling of his legs, denies orthopnea however stays in hospital bed at an incline.  Otherwise has had no complaints.  A: Acute on Chronic CHF unspecified is systolic or diastolic  P: I do suspect CHF as the cause, weight is up 3 additional lbs from his last visit suggesting he is underdiuresed.  Will start lasix 40mg  BID for 1-2 weeks and have him follow up in clinic in 1-2 weeks.   Obtain TTE. Check BNP Check BMP  Renal cyst HPI: Renal cyst noted on POCUS exam.  A:  Bilateral Renal cysts  P: Obtain formal Renal Ultrasound for evaluation   Medications Ordered Meds ordered this encounter  Medications  . rivaroxaban (XARELTO) 10 MG TABS tablet    Sig: Take 1 tablet (10 mg total) by mouth daily.    Dispense:  90 tablet  Refill:  3  . furosemide (LASIX) 40 MG tablet    Sig: Take 1 tablet (40 mg total) by mouth 2 (two) times daily.    Dispense:  60 tablet    Refill:  1   Other Orders Orders Placed This Encounter  Procedures  . US Renal    Standing Status:   Future    Standing Expiration Date:   12/26/2018    Order Specific Question:   Reason for Exam (SYMPTOM  OR DIAGNOSIS REQUIRED)    Answer:   chacterize renal cyst that were visualzied on POCUS    Order Specific Question:   Preferred imaging location?    Answer:    [facility] natriuretic peptide  . CBC with Diff  . BMP w Anion Gap (STAT/Sunquest-performed on-site)  . ECHOCARDIOGRAM COMPLETE    Standing Status:   Future    Standing Expiration Date:   01/26/2019    Order Specific Question:   Where should this test be performed    Answer:   [facility]    Order Specific Question:   Perflutren DEFINITY (image enhancing agent) should be administered unless hypersensitivity or allergy exist    Answer:   Administer Perflutren    Order Specific Question:   Expected Date:    Answer:   1 week   Follow Up: Return f/u 2-3 months with Dr Heber [place], 2 weeks in [facility]  for follow up CHF.

## 2017-10-28 DIAGNOSIS — N281 Cyst of kidney, acquired: Secondary | ICD-10-CM | POA: Insufficient documentation

## 2017-10-28 DIAGNOSIS — I5032 Chronic diastolic (congestive) heart failure: Secondary | ICD-10-CM | POA: Insufficient documentation

## 2017-10-28 NOTE — Assessment & Plan Note (Signed)
No bleeding, doing well on low dose xarelto.  Will continue Xarelto 10mg  daily

## 2017-10-28 NOTE — Assessment & Plan Note (Signed)
HPI: Seen in our clinic a few weeks ago, increased DOE and wegiht gain, BNP elevated, was started on Lasix, daughter reports he was taking this and peeing more however never got all the way back to baseline.  He has run out of the lasix and not taken in the last week.  She reports he has some increased swelling of his legs, denies orthopnea however stays in hospital bed at an incline.  Otherwise has had no complaints.  A: Acute on Chronic CHF unspecified is systolic or diastolic  P: I do suspect CHF as the cause, weight is up 3 additional lbs from his last visit suggesting he is underdiuresed.  Will start lasix 40mg  BID for 1-2 weeks and have him follow up in clinic in 1-2 weeks.   Obtain TTE. Check BNP Check BMP

## 2017-10-28 NOTE — Assessment & Plan Note (Signed)
HPI: Renal cyst noted on POCUS exam.  A:  Bilateral Renal cysts  P: Obtain formal Renal Ultrasound for evaluation

## 2017-10-29 ENCOUNTER — Telehealth: Payer: Self-pay | Admitting: Internal Medicine

## 2017-10-29 NOTE — Telephone Encounter (Signed)
Received a miss call, pt is calling about lab results

## 2017-10-29 NOTE — Telephone Encounter (Signed)
I called, daughter answered, she knee the results. Did not need anything

## 2017-10-29 NOTE — Telephone Encounter (Signed)
Labs nl for pt. BNP indeterminate but Dr Heber [place] started and has F/U appt 2 weeks

## 2017-11-02 ENCOUNTER — Ambulatory Visit (HOSPITAL_COMMUNITY)
Admission: RE | Admit: 2017-11-02 | Discharge: 2017-11-02 | Disposition: A | Discharge status: Home / Self Care | Payer: Medicare HMO | Source: Ambulatory Visit | Attending: Internal Medicine | Admitting: Internal Medicine

## 2017-11-02 DIAGNOSIS — E785 Hyperlipidemia, unspecified: Secondary | ICD-10-CM | POA: Insufficient documentation

## 2017-11-02 DIAGNOSIS — N189 Chronic kidney disease, unspecified: Secondary | ICD-10-CM | POA: Diagnosis not present

## 2017-11-02 DIAGNOSIS — I509 Heart failure, unspecified: Secondary | ICD-10-CM | POA: Insufficient documentation

## 2017-11-02 DIAGNOSIS — Z8673 Personal history of transient ischemic attack (TIA), and cerebral infarction without residual deficits: Secondary | ICD-10-CM | POA: Insufficient documentation

## 2017-11-02 DIAGNOSIS — I340 Nonrheumatic mitral (valve) insufficiency: Secondary | ICD-10-CM | POA: Diagnosis not present

## 2017-11-02 DIAGNOSIS — I130 Hypertensive heart and chronic kidney disease with heart failure and stage 1 through stage 4 chronic kidney disease, or unspecified chronic kidney disease: Secondary | ICD-10-CM | POA: Diagnosis not present

## 2017-11-02 NOTE — Progress Notes (Signed)
  Echocardiogram 2D Echocardiogram has been performed.  Technically difficult study due to poor patient compliance. Patient in wheelchair during length of exam.  [name] L Androw 11/02/2017, 2:41 PM

## 2017-11-08 ENCOUNTER — Ambulatory Visit (INDEPENDENT_AMBULATORY_CARE_PROVIDER_SITE_OTHER): Payer: Medicare HMO | Admitting: Internal Medicine

## 2017-11-08 ENCOUNTER — Ambulatory Visit (HOSPITAL_COMMUNITY)
Admission: RE | Admit: 2017-11-08 | Discharge: 2017-11-08 | Disposition: A | Discharge status: Home / Self Care | Payer: Medicare HMO | Source: Ambulatory Visit | Attending: Internal Medicine | Admitting: Internal Medicine

## 2017-11-08 ENCOUNTER — Other Ambulatory Visit: Payer: Self-pay

## 2017-11-08 ENCOUNTER — Encounter: Payer: Self-pay | Admitting: Internal Medicine

## 2017-11-08 VITALS — BP 134/90 | HR 64 | Temp 98.0°F | Ht 68.0 in | Wt 210.9 lb

## 2017-11-08 DIAGNOSIS — N261 Atrophy of kidney (terminal): Secondary | ICD-10-CM | POA: Insufficient documentation

## 2017-11-08 DIAGNOSIS — N281 Cyst of kidney, acquired: Secondary | ICD-10-CM | POA: Diagnosis not present

## 2017-11-08 DIAGNOSIS — Z79899 Other long term (current) drug therapy: Secondary | ICD-10-CM

## 2017-11-08 DIAGNOSIS — I5032 Chronic diastolic (congestive) heart failure: Secondary | ICD-10-CM | POA: Diagnosis not present

## 2017-11-08 DIAGNOSIS — Z23 Encounter for immunization: Secondary | ICD-10-CM

## 2017-11-08 DIAGNOSIS — I129 Hypertensive chronic kidney disease with stage 1 through stage 4 chronic kidney disease, or unspecified chronic kidney disease: Secondary | ICD-10-CM | POA: Diagnosis not present

## 2017-11-08 DIAGNOSIS — I509 Heart failure, unspecified: Secondary | ICD-10-CM | POA: Diagnosis not present

## 2017-11-08 NOTE — Progress Notes (Signed)
CC: CHF follow up  HPI:  [name] is a 84 y.o. male with PMH listed below who presents to clinic for CHF follow up.   Chronic diastolic CHF: [name] was seen in [facility] 82 month ago at which time he reported DOE and weight gain and he was found to have an elevated BNP.  He was started on Lasix 20 mg BID, but was noted to remain volume overload on exam when he followed up with his PCP 2 weeks later and his Lasix was increased to 40 mg BID. A TTE was ordered at the time which showed EF 65%, no regional wall motion abnormalities, mild AS, G1DD, LA dilation, and normal RV function. BNP 229.  He presents today for follow-up with his daughter who is his caretaker.  She reports patient is doing better and has noticed marked improvement in dyspnea and lower extremity swelling.  Denies orthopnea and PND.  Patient does not have any complaints today.  I noticed patient drinking from a 1.5 L water bottle and daughter reports he drinks 2-1/2 of these every day. He appears euvolemic on exam and is 3 pounds down.  Will therefore decrease Lasix as below.  Also educated patient importance of fluid restriction and salt intake. -Educated patient on fluid restriction, salt intake, and daily weights.  Advised daughter to buy a scale at her pharmacy as they do not have one at home. -Decrease Lasix to 40 mg QD - BMP to check renal function --> stable renal function  -Follow-up in 2 weeks to ensure he is responding appropriately to decrease Lasix dose -Currently on EBT (metoprolol and irbesartan)  Past Medical History:  Diagnosis Date  . Anemia   . BPH (benign prostatic hypertrophy)    TURP 05/19/13  . Cerebral embolism with cerebral infarction ([place]) 12/19/2013  . Chronic kidney disease    CHRONIC KIDNEY DISEASE, 2  . Difficulty hearing, right    BILATERAL HEARING LOSS - BEST TO TRY TO SPEAK INTO LEFT EAR  . DVT (deep venous thrombosis) ([place])   . Frequent falls   . Hyperlipidemia   . Hypertension   .  Incontinence of urine    SOME INCONTINENCE  . Left adrenal mass ([place]) 09/11/2013  . Meningioma ([place])   . Stroke ([place])    Cerebellar, 2013; WALKS WITH WALKER, ABLE TO DRESS AND BATHE HIMSELF BUT FAMILY TRIES TO PROVIDE SUPERVISION BECAUSE OF HIS HX OF FALL AND WEAKNESS LEGS, ARMS   . Thrombocytopenia ([place])   . Vitreous hemorrhage ([place]) 12/21/2013   OS 12/18/13 CT /MRI brain done for ataxia    Review of Systems:   Review of Systems  Constitutional: Positive for malaise/fatigue. Negative for chills and fever.  Respiratory: Positive for shortness of breath. Negative for cough.   Cardiovascular: Negative for chest pain, palpitations and leg swelling.  Gastrointestinal: Negative for nausea and vomiting.  Neurological: Negative for dizziness and headaches.   Physical Exam: Vitals:   11/08/17 1359  BP: 134/90  Pulse: 64  Temp: 98 F (36.7 C)  SpO2: 94%  Weight: 210 lb 14.4 oz (95.7 kg)  Height: 5\' 8"  (1.727 m)   General: Well-appearing elderly male, hard of hearing, sitting up in no acute distress Mouth: dry MM  CV: RRR, nl S1/S2, no mrg, no JVD  Pulm: CTAB, no wheezes or crackles, no increased WOB on RA  Ext: Warm and well-perfused without cyanosis or edema  Assessment & Plan:   See Encounters Tab for problem based charting.  Patient discussed with Dr. Rebeca Alert

## 2017-11-08 NOTE — Patient Instructions (Addendum)
[name],   As of tomorrow, please start taking Lasix 40 mg once a day only.   You can purchase a scale at any pharmacy for a low price.  It is important to weigh yourself daily to keep track of the fluid in your body.  Your water intake to 2 Liters  Per day.  You should also limit your salt intake to less than 2 g of sodium per day.  Please make a follow-up appointment with [place] in 2 weeks to check up with your fluid status and to see if we need to make more adjustments to your Lasix dose.  In the meantime call us if you have any questions or concerns.  - Dr. Frederico Hamman

## 2017-11-09 ENCOUNTER — Encounter: Payer: Self-pay | Admitting: Internal Medicine

## 2017-11-09 LAB — BMP8+ANION GAP
Anion Gap: 21 mmol/L — ABNORMAL HIGH (ref 10.0–18.0)
BUN/Creatinine Ratio: 18 (ref 10–24)
BUN: 23 mg/dL (ref 10–36)
CO2: 22 mmol/L (ref 20–29)
Calcium: 10.1 mg/dL (ref 8.6–10.2)
Chloride: 101 mmol/L (ref 96–106)
Creatinine, Ser: 1.28 mg/dL — ABNORMAL HIGH (ref 0.76–1.27)
GFR calc Af Amer: 57 mL/min/{1.73_m2} — ABNORMAL LOW (ref >59)
GFR calc non Af Amer: 49 mL/min/{1.73_m2} — ABNORMAL LOW (ref >59)
Glucose: 90 mg/dL (ref 65–99)
Potassium: 4 mmol/L (ref 3.5–5.2)
Sodium: 144 mmol/L (ref 134–144)

## 2017-11-09 NOTE — Assessment & Plan Note (Addendum)
Chronic diastolic CHF: [name] was seen in [facility] 1 month ago at which time he reported DOE and weight gain and he was found to have an elevated BNP.  He was started on Lasix 20 mg BID, but was noted to remain volume overload on exam when he followed up with his PCP 2 weeks later and his Lasix was increased to 40 mg BID. A TTE was ordered at the time which showed EF 65%, no regional wall motion abnormalities, mild AS, G1DD, LA dilation, and normal RV function. BNP 229.  He presents today for follow-up with his daughter who is his caretaker.  She reports patient is doing better and has noticed marked improvement in dyspnea and lower extremity swelling.  Denies orthopnea and PND.  Patient does not have any complaints today.  I noticed patient drinking from a 1.5 L water bottle and daughter reports he drinks 2-1/2 of these every day. He appears euvolemic on exam and is 3 pounds down.  Will therefore decrease Lasix as below.  Also educated patient importance of fluid restriction and salt intake. -Educated patient on fluid restriction, salt intake, and daily weights.  Advised daughter to buy a scale at her pharmacy as they do not have one at home. -Decrease Lasix to 40 mg QD - BMP to check renal function --> stable renal function  -Follow-up in 2 weeks to ensure he is responding appropriately to decrease Lasix dose -Currently on EBT (metoprolol and irbesartan)

## 2017-11-11 NOTE — Progress Notes (Signed)
Internal Medicine Clinic Attending  Case discussed with Dr. Santos-Sanchez at the time of the visit.  We reviewed the resident's history and exam and pertinent patient test results.  I agree with the assessment, diagnosis, and plan of care documented in the resident's note.  Alexander Raines, M.D., Ph.D.  

## 2017-11-22 ENCOUNTER — Ambulatory Visit (INDEPENDENT_AMBULATORY_CARE_PROVIDER_SITE_OTHER): Payer: Medicare HMO | Admitting: Internal Medicine

## 2017-11-22 ENCOUNTER — Encounter: Payer: Self-pay | Admitting: Internal Medicine

## 2017-11-22 ENCOUNTER — Other Ambulatory Visit: Payer: Self-pay

## 2017-11-22 DIAGNOSIS — N281 Cyst of kidney, acquired: Secondary | ICD-10-CM

## 2017-11-22 DIAGNOSIS — N261 Atrophy of kidney (terminal): Secondary | ICD-10-CM | POA: Diagnosis not present

## 2017-11-22 DIAGNOSIS — Z79899 Other long term (current) drug therapy: Secondary | ICD-10-CM | POA: Diagnosis not present

## 2017-11-22 DIAGNOSIS — H9190 Unspecified hearing loss, unspecified ear: Secondary | ICD-10-CM | POA: Diagnosis not present

## 2017-11-22 DIAGNOSIS — I5030 Unspecified diastolic (congestive) heart failure: Secondary | ICD-10-CM | POA: Diagnosis not present

## 2017-11-22 MED ORDER — FUROSEMIDE 40 MG PO TABS: 40.0000 mg | ORAL_TABLET | ORAL | 5 refills | Status: DC

## 2017-11-22 NOTE — Assessment & Plan Note (Signed)
Patient has been doing well. No shortness of breath or leg swelling at home. Has lost 10 lb since last visit (Today weight 200 lb). Euvulemic on exam. We decrease the Lasix dose to 40 mg every other day. -Follow up in clinic in 2 weeks to decide about Lasix dose -Continue Metoprolol and Irbesartan

## 2017-11-22 NOTE — Assessment & Plan Note (Addendum)
Renal Ultra sound showed multiple renal cyst and renal atrophy.  Asymptomatic. No intervention at this time but recommend to follow up with Dr. Heber [place] to be evaluated for further imaging to characterize the cysts.

## 2017-11-22 NOTE — Progress Notes (Signed)
   CC: F/U of diastolic heart failure  HPI:  [name] is a 84 y.o. is here in clinic today to be evaluated for response to decrease Lasix dose.  Please see assessment and plan form for further details.  Past Medical History:  Diagnosis Date  . Anemia   . BPH (benign prostatic hypertrophy)    TURP 05/19/13  . Cerebral embolism with cerebral infarction ([place]) 12/19/2013  . Chronic kidney disease    CHRONIC KIDNEY DISEASE, 2  . Difficulty hearing, right    BILATERAL HEARING LOSS - BEST TO TRY TO SPEAK INTO LEFT EAR  . DVT (deep venous thrombosis) ([place])   . Frequent falls   . Hyperlipidemia   . Hypertension   . Incontinence of urine    SOME INCONTINENCE  . Left adrenal mass ([place]) 09/11/2013  . Meningioma ([place])   . Stroke ([place])    Cerebellar, 2013; WALKS WITH WALKER, ABLE TO DRESS AND BATHE HIMSELF BUT FAMILY TRIES TO PROVIDE SUPERVISION BECAUSE OF HIS HX OF FALL AND WEAKNESS LEGS, ARMS   . Thrombocytopenia ([place])   . Vitreous hemorrhage ([place]) 12/21/2013   OS 12/18/13 CT /MRI brain done for ataxia   Social Hx: Lives with his daughter who is his caregiver Nonsmoker No alcohol No drug  Review of Systems:Review of Systems  Constitutional: Negative for chills and fever.  HENT: Positive for hearing loss.   Respiratory: Negative for cough, shortness of breath and wheezing.   Cardiovascular: Negative for chest pain, palpitations, orthopnea, claudication, leg swelling and PND.  Neurological: Negative for dizziness.    Physical Exam:  Vitals:   11/22/17 1435  BP: 122/74  Pulse: 66  Temp: 98.1 F (36.7 C)  TempSrc: Oral  SpO2: 95%  Weight: 200 lb (90.7 kg)  Height: 5\' 9"  (1.753 m)   Physical Exam  Constitutional: He is alert and oriented to , place but not time. He appears well-developed and well-nourished.  HENT:  Head: Normocephalic and atraumatic.  Cardiovascular: Normal rate, regular rhythm and normal heart sounds. Exam reveals no gallop and no friction rub.  No  murmur heard. Pulmonary/Chest: Effort normal and breath sounds normal. No respiratory distress. He has no wheezes. He has no rales.  Abdominal: Soft. Bowel sounds are normal. He exhibits no distension. There is no tenderness.  Musculoskeletal: He exhibits 1 + pitting edema.  Neurological: He is alert and oriented to person, place, and time.  Skin: Skin is warm and dry.    Assessment & Plan:   See Encounters Tab for problem based charting.  Patient seen with Dr. Beryle Beams

## 2017-11-22 NOTE — Patient Instructions (Addendum)
It was our pleasure taking care of you in our clinic today. You do not have extra fluid in your legs or lungs as we examined you, so we decrease your Lasix to 40 mg every other day. Please come back to clinic in 2 weeks to be evaluated. Your renal Ultrasound showed some renal cysts that was present in previous study 4 years ago. We just observe that and it does not need intervention at this time. Please continue your medications as instructed and let us know if you have any question or concern.  Thanks Dr. Linna Hoff

## 2017-11-22 NOTE — Progress Notes (Signed)
Medicine attending: I personally interviewed and briefly examined this patient on the day of the patient visit and reviewed pertinent clinical laboratory data  with resident physician Dr. Linna Hoff and we discussed a management plan. 84 y/o ma recently rxd for fluid overload presumed related to diastolic cardiac dysfunction. Weight 10 lbs lower today c/w 2 wks ago. Lungs clear, regular rhythm, no S3 gallop, no peripheral edema. We will decrease his diuretic further to lasix 40 mg M/W/F.

## 2017-12-10 ENCOUNTER — Other Ambulatory Visit: Payer: Self-pay

## 2017-12-10 ENCOUNTER — Encounter: Payer: Self-pay | Admitting: Internal Medicine

## 2017-12-10 ENCOUNTER — Ambulatory Visit (INDEPENDENT_AMBULATORY_CARE_PROVIDER_SITE_OTHER): Payer: Medicare HMO | Admitting: Internal Medicine

## 2017-12-10 VITALS — BP 143/70 | HR 66 | Temp 97.7°F | Wt 214.2 lb

## 2017-12-10 DIAGNOSIS — I130 Hypertensive heart and chronic kidney disease with heart failure and stage 1 through stage 4 chronic kidney disease, or unspecified chronic kidney disease: Secondary | ICD-10-CM | POA: Diagnosis not present

## 2017-12-10 DIAGNOSIS — G119 Hereditary ataxia, unspecified: Secondary | ICD-10-CM | POA: Diagnosis not present

## 2017-12-10 DIAGNOSIS — N183 Chronic kidney disease, stage 3 (moderate): Secondary | ICD-10-CM | POA: Diagnosis not present

## 2017-12-10 DIAGNOSIS — I5032 Chronic diastolic (congestive) heart failure: Secondary | ICD-10-CM

## 2017-12-10 DIAGNOSIS — D329 Benign neoplasm of meninges, unspecified: Secondary | ICD-10-CM

## 2017-12-10 DIAGNOSIS — Z79899 Other long term (current) drug therapy: Secondary | ICD-10-CM

## 2017-12-10 DIAGNOSIS — F0390 Unspecified dementia without behavioral disturbance: Secondary | ICD-10-CM

## 2017-12-10 DIAGNOSIS — I5030 Unspecified diastolic (congestive) heart failure: Secondary | ICD-10-CM

## 2017-12-10 NOTE — Progress Notes (Signed)
   CC: diastolic heart failure  HPI:  [name] is a 84 y.o. with a PMH of dCHF, HTN, cerebellar ataxia, dementia, meningioma, CKD 3, presenting to clinic for follow up on dCHF.  Patient with recent diagnosis of dCHF after daughter noticed newer onset bendopnea. He was started on lasix daily which was then increased to twice a day (daughter states only took for 3 days), then decreased to three times a week after 10lb weight loss. Daughter states that she thinks his symptoms might all have been from a URI as he was having a mild, nonproductive cough (still ongoing) and several family members with same symptoms. She has noticed improvement in bendopnea. She denies noticing LE edema; he has not complained of shortness of breath, chest pain and has not had to have his bed inclined more than usual.   Patient denies chest pain or shortness of breath.  Please see problem based Assessment and Plan for status of patients chronic conditions.  Past Medical History:  Diagnosis Date  . Anemia   . BPH (benign prostatic hypertrophy)    TURP 05/19/13  . Cerebral embolism with cerebral infarction ([place]) 12/19/2013  . Chronic kidney disease    CHRONIC KIDNEY DISEASE, 2  . Difficulty hearing, right    BILATERAL HEARING LOSS - BEST TO TRY TO SPEAK INTO LEFT EAR  . DVT (deep venous thrombosis) ([place])   . Frequent falls   . Hyperlipidemia   . Hypertension   . Incontinence of urine    SOME INCONTINENCE  . Left adrenal mass ([place]) 09/11/2013  . Meningioma ([place])   . Stroke ([place])    Cerebellar, 2013; WALKS WITH WALKER, ABLE TO DRESS AND BATHE HIMSELF BUT FAMILY TRIES TO PROVIDE SUPERVISION BECAUSE OF HIS HX OF FALL AND WEAKNESS LEGS, ARMS   . Thrombocytopenia ([place])   . Vitreous hemorrhage ([place]) 12/21/2013   OS 12/18/13 CT /MRI brain done for ataxia     Review of Systems:   Per HPI  Physical Exam:  Vitals:   12/10/17 1452  BP: (!) 143/70  Pulse: 66  Temp: 97.7 F (36.5 C)  TempSrc: Oral  SpO2:  93%  Weight: 214 lb 3.2 oz (97.2 kg)   GENERAL- alert, co-operative, appears as stated age, not in any distress. CARDIAC- RRR, no murmurs, rubs or gallops. RESP- Moving equal volumes of air, and clear to auscultation bilaterally, mild right sided rales ABDOMEN- Soft, nontender, bowel sounds present. EXTREMITIES- pulse 2+ PT/DP, symmetric. Mild pitting edema to ankles bilaterally SKIN- Warm, dry.  Assessment & Plan:   See Encounters Tab for problem based charting.   Patient discussed with Dr. Angelia Mould   Alphonzo Grieve, MD Internal Medicine PGY-3

## 2017-12-10 NOTE — Progress Notes (Signed)
Internal Medicine Clinic Attending  Case discussed with Dr. [place]  at the time of the visit.  We reviewed the resident's history and exam and pertinent patient test results.  I agree with the assessment, diagnosis, and plan of care documented in the resident's note.  

## 2017-12-10 NOTE — Assessment & Plan Note (Signed)
Patient with 14lb weight gain since last visit after switching to M/W/F dosing of lasix. He is only mildly volume overloaded on exam and overall his symptoms seem to be improving. In setting of his functional status and co-morbidities, we will keep current regimen and have him follow up in a few weeks; his daughter will let us know if his symptoms are worsening (I showed her how to check for LE pitting edema).   Plan: --continue lasix 40mg  M/W/F --f/u in Nov with Dr. Heber [place] --daughter to let us know if new symptoms arise or current symptoms are worsening

## 2017-12-10 NOTE — Patient Instructions (Signed)
Continue the Monday, Wednesday, Friday fluid pill. If you notice worsening cough, shortness of breath, or swelling in his legs, let us know. Please make an appointment to see Dr. Heber [place] in November

## 2018-01-24 ENCOUNTER — Ambulatory Visit (INDEPENDENT_AMBULATORY_CARE_PROVIDER_SITE_OTHER): Payer: Medicare HMO | Admitting: Internal Medicine

## 2018-01-24 ENCOUNTER — Other Ambulatory Visit: Payer: Self-pay

## 2018-01-24 ENCOUNTER — Encounter: Payer: Self-pay | Admitting: Internal Medicine

## 2018-01-24 VITALS — BP 162/82 | HR 58 | Temp 97.9°F | Wt 210.0 lb

## 2018-01-24 DIAGNOSIS — L509 Urticaria, unspecified: Secondary | ICD-10-CM

## 2018-01-24 DIAGNOSIS — Z7901 Long term (current) use of anticoagulants: Secondary | ICD-10-CM

## 2018-01-24 DIAGNOSIS — I10 Essential (primary) hypertension: Secondary | ICD-10-CM

## 2018-01-24 DIAGNOSIS — Z86718 Personal history of other venous thrombosis and embolism: Secondary | ICD-10-CM

## 2018-01-24 DIAGNOSIS — R252 Cramp and spasm: Secondary | ICD-10-CM | POA: Diagnosis not present

## 2018-01-24 DIAGNOSIS — I5032 Chronic diastolic (congestive) heart failure: Secondary | ICD-10-CM

## 2018-01-24 DIAGNOSIS — I110 Hypertensive heart disease with heart failure: Secondary | ICD-10-CM | POA: Diagnosis not present

## 2018-01-24 DIAGNOSIS — Z79899 Other long term (current) drug therapy: Secondary | ICD-10-CM

## 2018-01-24 MED ORDER — TRIAMCINOLONE ACETONIDE 0.5 % EX OINT: 1.0000 "application " | TOPICAL_OINTMENT | Freq: Two times a day (BID) | CUTANEOUS | 2 refills | Status: DC

## 2018-01-24 MED ORDER — RIVAROXABAN 10 MG PO TABS: 10.0000 mg | ORAL_TABLET | Freq: Every day | ORAL | 3 refills | Status: DC

## 2018-01-24 MED ORDER — FUROSEMIDE 40 MG PO TABS: 40.0000 mg | ORAL_TABLET | ORAL | 4 refills | Status: DC

## 2018-01-24 NOTE — Progress Notes (Signed)
  Subjective:  HPI: [name] is a 84 y.o. male who presents for f/u of CHF  Please see Assessment and Plan below for the status of his chronic medical problems.  Review of Systems: Review of Systems  Constitutional: Negative for fever.  Respiratory: Negative for cough and shortness of breath.   Cardiovascular: Negative for chest pain, orthopnea and leg swelling.  Gastrointestinal: Negative for abdominal pain and constipation.  Genitourinary: Negative for dysuria and urgency.    Objective:  Physical Exam: Vitals:   01/24/18 1107  BP: (!) 162/82  Pulse: (!) 58  Temp: 97.9 F (36.6 C)  TempSrc: Oral  SpO2: 98%  Weight: 210 lb (95.3 kg)   Body mass index is 31.01 kg/m. Physical Exam  Constitutional: He appears well-developed and well-nourished.  HOH  Cardiovascular: Normal rate, regular rhythm and normal heart sounds.  Pulmonary/Chest: Effort normal and breath sounds normal.  Abdominal: Soft. Bowel sounds are normal.  Musculoskeletal: He exhibits edema (trace bilateral at shin).  Skin:  Mildly raise elliptical area of dry skin on bilateral lower legs.  Nursing note and vitals reviewed.  Assessment & Plan:  Chronic diastolic (congestive) heart failure (HCC) HPI: [name] reports he has done well with leg swelling and SOB with MWF lasix, she struggles with diaper changes as he sometimes soaks through after taking lasix.  He has intermittently screamed out about leg cramps (as not associated it yet with particular days), has given extra bananas to him for potassium.  Overall she feels he is doing better.  A: Chronic dCHF  P: COntinue lasix 40mg  MWF, and PRN for weight gain Check BMP>> stable, K+ normal. BP elevated slightly, will monitor  HTN (hypertension) HPI: no complaints  A: Essential HTN above goal  P: Continue irbesartan 300mg  daily Continue metoprol succinate 50mg  daily Continue amlodipine 5mg  daily  [place] HPI: Occasional skin wheals have  returned, [name] asking about steroid cream again  A: Skin rash  P: Reorder kenalog cream.   Medications Ordered Meds ordered this encounter  Medications  . triamcinolone ointment (KENALOG) 0.5 %    Sig: Apply 1 application topically 2 (two) times daily.    Dispense:  30 g    Refill:  2  . rivaroxaban (XARELTO) 10 MG TABS tablet    Sig: Take 1 tablet (10 mg total) by mouth daily.    Dispense:  90 tablet    Refill:  3  . furosemide (LASIX) 40 MG tablet    Sig: Take 1 tablet (40 mg total) by mouth every Monday, Wednesday, and Friday.    Dispense:  30 tablet    Refill:  4   Other Orders Orders Placed This Encounter  Procedures  . BMP8+Anion Gap   Follow Up: Return 4-6 months.

## 2018-01-25 LAB — BMP8+ANION GAP
Anion Gap: 14 mmol/L (ref 10.0–18.0)
BUN/Creatinine Ratio: 14 (ref 10–24)
BUN: 18 mg/dL (ref 10–36)
CO2: 25 mmol/L (ref 20–29)
Calcium: 10.1 mg/dL (ref 8.6–10.2)
Chloride: 103 mmol/L (ref 96–106)
Creatinine, Ser: 1.27 mg/dL (ref 0.76–1.27)
GFR calc Af Amer: 57 mL/min/{1.73_m2} — ABNORMAL LOW (ref >59)
GFR calc non Af Amer: 49 mL/min/{1.73_m2} — ABNORMAL LOW (ref >59)
Glucose: 93 mg/dL (ref 65–99)
Potassium: 4.5 mmol/L (ref 3.5–5.2)
Sodium: 142 mmol/L (ref 134–144)

## 2018-01-29 NOTE — Assessment & Plan Note (Signed)
HPI: Occasional skin wheals have returned, [name] asking about steroid cream again  A: Skin rash  P: Reorder kenalog cream.

## 2018-01-29 NOTE — Assessment & Plan Note (Addendum)
HPI: [name] reports he has done well with leg swelling and SOB with MWF lasix, she struggles with diaper changes as he sometimes soaks through after taking lasix.  He has intermittently screamed out about leg cramps (as not associated it yet with particular days), has given extra bananas to him for potassium.  Overall she feels he is doing better.  A: Chronic dCHF  P: COntinue lasix 40mg  MWF, and PRN for weight gain Check BMP>> stable, K+ normal. If leg cramps continue [name] will call and we can obtain additional labs BP elevated slightly, will monitor

## 2018-01-29 NOTE — Assessment & Plan Note (Signed)
-  Continue low dose Xarelto 10mg  daily

## 2018-01-29 NOTE — Assessment & Plan Note (Signed)
HPI: no complaints  A: Essential HTN above goal  P: Continue irbesartan 300mg  daily Continue metoprol succinate 50mg  daily Continue amlodipine 5mg  daily

## 2018-04-26 ENCOUNTER — Other Ambulatory Visit: Payer: Self-pay | Admitting: *Deleted

## 2018-04-26 MED ORDER — ATORVASTATIN CALCIUM 20 MG PO TABS: 20.0000 mg | ORAL_TABLET | Freq: Every day | ORAL | 3 refills | Status: DC

## 2018-04-26 MED ORDER — METOPROLOL SUCCINATE ER 50 MG PO TB24: 50.0000 mg | ORAL_TABLET | Freq: Every day | ORAL | 3 refills | Status: DC

## 2018-04-26 MED ORDER — IRBESARTAN 300 MG PO TABS: 300.0000 mg | ORAL_TABLET | Freq: Every day | ORAL | 3 refills | Status: DC

## 2018-04-26 MED ORDER — AMLODIPINE BESYLATE 5 MG PO TABS: 5.0000 mg | ORAL_TABLET | Freq: Every day | ORAL | 3 refills | Status: DC

## 2018-04-26 NOTE — Telephone Encounter (Signed)
Next appt scheduled 05/23/18 with PCP. 

## 2018-05-23 ENCOUNTER — Ambulatory Visit: Payer: Medicare HMO | Admitting: Internal Medicine

## 2018-06-17 ENCOUNTER — Telehealth: Payer: Self-pay | Admitting: Internal Medicine

## 2018-06-17 NOTE — Telephone Encounter (Signed)
Pt daughter has notice pt urine to be darker than normal, she want to make pt is peeing blood; pt daughter [phone]

## 2018-06-17 NOTE — Telephone Encounter (Signed)
Attempted to call caretaker back, lm for rtc

## 2018-06-17 NOTE — Telephone Encounter (Signed)
If he is not complaining at all (no urinary symptoms and mental status at baseline) I would recommend just increasing water, if he has complaints would be fine to obtain U/A and urine culture.

## 2018-06-17 NOTE — Telephone Encounter (Signed)
Dr Heber Lopeno, do you want [facility] to do a urine?

## 2018-06-20 NOTE — Telephone Encounter (Signed)
Lm for rtc 

## 2018-06-27 NOTE — Telephone Encounter (Signed)
lm

## 2018-07-18 ENCOUNTER — Ambulatory Visit (INDEPENDENT_AMBULATORY_CARE_PROVIDER_SITE_OTHER): Payer: Medicare HMO | Admitting: Internal Medicine

## 2018-07-18 ENCOUNTER — Other Ambulatory Visit: Payer: Self-pay

## 2018-07-18 DIAGNOSIS — Z86718 Personal history of other venous thrombosis and embolism: Secondary | ICD-10-CM | POA: Diagnosis not present

## 2018-07-18 DIAGNOSIS — L509 Urticaria, unspecified: Secondary | ICD-10-CM | POA: Diagnosis not present

## 2018-07-18 DIAGNOSIS — I10 Essential (primary) hypertension: Secondary | ICD-10-CM

## 2018-07-18 DIAGNOSIS — Z993 Dependence on wheelchair: Secondary | ICD-10-CM

## 2018-07-18 DIAGNOSIS — Z79899 Other long term (current) drug therapy: Secondary | ICD-10-CM | POA: Diagnosis not present

## 2018-07-18 DIAGNOSIS — I69393 Ataxia following cerebral infarction: Secondary | ICD-10-CM

## 2018-07-18 DIAGNOSIS — F0390 Unspecified dementia without behavioral disturbance: Secondary | ICD-10-CM | POA: Diagnosis not present

## 2018-07-18 DIAGNOSIS — I69993 Ataxia following unspecified cerebrovascular disease: Secondary | ICD-10-CM

## 2018-07-18 DIAGNOSIS — I110 Hypertensive heart disease with heart failure: Secondary | ICD-10-CM

## 2018-07-18 DIAGNOSIS — I5032 Chronic diastolic (congestive) heart failure: Secondary | ICD-10-CM | POA: Diagnosis not present

## 2018-07-18 MED ORDER — TRIAMCINOLONE ACETONIDE 0.5 % EX OINT: 1.0000 "application " | TOPICAL_OINTMENT | Freq: Two times a day (BID) | CUTANEOUS | 2 refills | Status: DC

## 2018-07-18 NOTE — Assessment & Plan Note (Signed)
Patient reports that he occasionally has his lower extremity rashes returned.  The steroid cream worked well previously and she is asking for refill.  Assessment skin rash  Plan refill Kenalog ointment.

## 2018-07-18 NOTE — Assessment & Plan Note (Signed)
[name] reports that he is mostly been staying inside he has had no issues no complaints.  He is taking all of his blood pressure medications without issue.  A: HTN at goal  P: Continue current medications.

## 2018-07-18 NOTE — Progress Notes (Addendum)
  [facility] Telephone Encounter Continuity Care Appointment  HPI:   This telephone encounter was created for [name] on 07/18/2018 for the following purpose/cc HTN, Hx of DVT, CVA.   Past Medical History:  Past Medical History:  Diagnosis Date  . Anemia   . BPH (benign prostatic hypertrophy)    TURP 05/19/13  . Cerebral embolism with cerebral infarction ([place]) 12/19/2013  . Chronic kidney disease    CHRONIC KIDNEY DISEASE, 2  . Difficulty hearing, right    BILATERAL HEARING LOSS - BEST TO TRY TO SPEAK INTO LEFT EAR  . DVT (deep venous thrombosis) ([place])   . Frequent falls   . Hyperlipidemia   . Hypertension   . Incontinence of urine    SOME INCONTINENCE  . Left adrenal mass ([place]) 09/11/2013  . Meningioma ([place])   . Stroke ([place])    Cerebellar, 2013; WALKS WITH WALKER, ABLE TO DRESS AND BATHE HIMSELF BUT FAMILY TRIES TO PROVIDE SUPERVISION BECAUSE OF HIS HX OF FALL AND WEAKNESS LEGS, ARMS   . Thrombocytopenia ([place])   . Vitreous hemorrhage ([place]) 12/21/2013   OS 12/18/13 CT /MRI brain done for ataxia       ROS:  Review of Systems  Constitutional: Negative for fever.  Respiratory: Negative for cough.   Genitourinary: Negative for dysuria.  Skin: Positive for rash.  Neurological: Negative for dizziness.  Endo/Heme/Allergies: Negative for polydipsia.       Assessment / Plan / Recommendations:   Please see A&P under problem oriented charting for assessment of the patient's acute and chronic medical conditions.     Consent and Medical Decision Making:    This is a telephone encounter between [name] and [name] on 07/18/2018 for f/u. The visit was conducted with the patient located at home and [name] at [facility]. The patient's identity was confirmed using their DOB and current address. The his/her legal guardian has consented to being evaluated through a telephone encounter and understands the associated risks (an  examination cannot be done and the patient may need to come in for an appointment) / benefits (allows the patient to remain at home, decreasing exposure to coronavirus). I personally spent 16 minutes on medical discussion.   Limited involvement of patient for history due to dementia and HOH, most of interview was conducted with [name], his daughter and caregiver.    ADDENDUM: In addition [name] called back that she has struggled more of late in caring for her father, he does have multiple comorbid illness and our goal for his is symptomatic reflief.

## 2018-07-18 NOTE — Assessment & Plan Note (Signed)
He continues to use a hospital bed at home as well as a wheelchair for mobility after a stroke.  Rachelle reports that she may need a new mattress for the bed she is [facility] she thinks the bed is about 83 years old.  She also wants them to look at his wheelchair as the armrest is loose and she wants to make sure that this is not a problem.

## 2018-08-13 ENCOUNTER — Telehealth: Payer: Self-pay | Admitting: *Deleted

## 2018-08-13 DIAGNOSIS — N183 Chronic kidney disease, stage 3 unspecified: Secondary | ICD-10-CM

## 2018-08-13 DIAGNOSIS — G301 Alzheimer's disease with late onset: Secondary | ICD-10-CM

## 2018-08-13 DIAGNOSIS — I5032 Chronic diastolic (congestive) heart failure: Secondary | ICD-10-CM

## 2018-08-13 DIAGNOSIS — F0280 Dementia in other diseases classified elsewhere without behavioral disturbance: Secondary | ICD-10-CM

## 2018-08-13 DIAGNOSIS — G3281 Cerebellar ataxia in diseases classified elsewhere: Secondary | ICD-10-CM

## 2018-08-13 NOTE — Telephone Encounter (Signed)
[facility] would be a great idea, helen can you orchestrate the referral.  For the diarrhea I would recommend just to continue to monitor, if needed she could give imodium

## 2018-08-13 NOTE — Telephone Encounter (Signed)
Pt's daughter calls and states, a diaper last night and this am has brownish liquid in it. Pt not c/o pain, discomfort of any kind. He is eating and drinking as usual. Not having his usual characteristics of uti but she is concerned. Also she would like to start having Rolla come in. Maybe [facility] pallative would/ could be of help to her Please advise

## 2018-08-14 NOTE — Addendum Note (Signed)
Addended by: [name] on: 08/14/2018 03:52 PM   Modules accepted: Orders

## 2018-08-14 NOTE — Addendum Note (Signed)
Addended by: Joni Reining C on: 08/14/2018 03:51 PM   Modules accepted: Orders

## 2018-08-14 NOTE — Assessment & Plan Note (Signed)
Daughter called back, needs new order for hospital bed mattress as previous mattress is no longer viable. Will place DME order.

## 2018-08-14 NOTE — Telephone Encounter (Signed)
Will need referral to Palliative Care. This can then be sent to [facility] for [facility]. [name], RN, BSN

## 2018-08-14 NOTE — Telephone Encounter (Signed)
Dr Heber , im sorry, daughter thought he had another uti because this was urine color, that's why in note stated that he did not have his usual uti symptoms, sorry for misunderstanding

## 2018-08-21 ENCOUNTER — Encounter (HOSPITAL_COMMUNITY): Payer: Self-pay | Admitting: Emergency Medicine

## 2018-08-21 ENCOUNTER — Other Ambulatory Visit: Payer: Self-pay

## 2018-08-21 ENCOUNTER — Inpatient Hospital Stay (HOSPITAL_COMMUNITY)
Admission: EM | Admit: 2018-08-21 | Discharge: 2018-08-25 | DRG: 378 | Disposition: A | Discharge status: Home Health | Payer: Medicare HMO | Attending: Internal Medicine | Admitting: Internal Medicine

## 2018-08-21 DIAGNOSIS — Z20828 Contact with and (suspected) exposure to other viral communicable diseases: Secondary | ICD-10-CM | POA: Diagnosis present

## 2018-08-21 DIAGNOSIS — I5032 Chronic diastolic (congestive) heart failure: Secondary | ICD-10-CM | POA: Diagnosis present

## 2018-08-21 DIAGNOSIS — Z9981 Dependence on supplemental oxygen: Secondary | ICD-10-CM | POA: Diagnosis not present

## 2018-08-21 DIAGNOSIS — N183 Chronic kidney disease, stage 3 (moderate): Secondary | ICD-10-CM | POA: Diagnosis present

## 2018-08-21 DIAGNOSIS — R471 Dysarthria and anarthria: Secondary | ICD-10-CM | POA: Diagnosis not present

## 2018-08-21 DIAGNOSIS — Z86011 Personal history of benign neoplasm of the brain: Secondary | ICD-10-CM

## 2018-08-21 DIAGNOSIS — R58 Hemorrhage, not elsewhere classified: Secondary | ICD-10-CM | POA: Diagnosis not present

## 2018-08-21 DIAGNOSIS — Z79899 Other long term (current) drug therapy: Secondary | ICD-10-CM | POA: Diagnosis not present

## 2018-08-21 DIAGNOSIS — Z9181 History of falling: Secondary | ICD-10-CM

## 2018-08-21 DIAGNOSIS — H9193 Unspecified hearing loss, bilateral: Secondary | ICD-10-CM | POA: Diagnosis present

## 2018-08-21 DIAGNOSIS — I350 Nonrheumatic aortic (valve) stenosis: Secondary | ICD-10-CM | POA: Diagnosis present

## 2018-08-21 DIAGNOSIS — K922 Gastrointestinal hemorrhage, unspecified: Secondary | ICD-10-CM | POA: Diagnosis present

## 2018-08-21 DIAGNOSIS — D62 Acute posthemorrhagic anemia: Secondary | ICD-10-CM | POA: Diagnosis present

## 2018-08-21 DIAGNOSIS — Z95828 Presence of other vascular implants and grafts: Secondary | ICD-10-CM

## 2018-08-21 DIAGNOSIS — Z86718 Personal history of other venous thrombosis and embolism: Secondary | ICD-10-CM | POA: Diagnosis not present

## 2018-08-21 DIAGNOSIS — I130 Hypertensive heart and chronic kidney disease with heart failure and stage 1 through stage 4 chronic kidney disease, or unspecified chronic kidney disease: Secondary | ICD-10-CM | POA: Diagnosis not present

## 2018-08-21 DIAGNOSIS — I69322 Dysarthria following cerebral infarction: Secondary | ICD-10-CM

## 2018-08-21 DIAGNOSIS — H18419 Arcus senilis, unspecified eye: Secondary | ICD-10-CM | POA: Diagnosis present

## 2018-08-21 DIAGNOSIS — G119 Hereditary ataxia, unspecified: Secondary | ICD-10-CM | POA: Diagnosis not present

## 2018-08-21 DIAGNOSIS — Z03818 Encounter for observation for suspected exposure to other biological agents ruled out: Secondary | ICD-10-CM | POA: Diagnosis not present

## 2018-08-21 DIAGNOSIS — R001 Bradycardia, unspecified: Secondary | ICD-10-CM | POA: Diagnosis not present

## 2018-08-21 DIAGNOSIS — D329 Benign neoplasm of meninges, unspecified: Secondary | ICD-10-CM | POA: Diagnosis not present

## 2018-08-21 DIAGNOSIS — Z7901 Long term (current) use of anticoagulants: Secondary | ICD-10-CM

## 2018-08-21 DIAGNOSIS — F0390 Unspecified dementia without behavioral disturbance: Secondary | ICD-10-CM | POA: Diagnosis present

## 2018-08-21 DIAGNOSIS — E785 Hyperlipidemia, unspecified: Secondary | ICD-10-CM | POA: Diagnosis present

## 2018-08-21 DIAGNOSIS — K5791 Diverticulosis of intestine, part unspecified, without perforation or abscess with bleeding: Secondary | ICD-10-CM | POA: Diagnosis not present

## 2018-08-21 DIAGNOSIS — I69393 Ataxia following cerebral infarction: Secondary | ICD-10-CM | POA: Diagnosis not present

## 2018-08-21 DIAGNOSIS — D649 Anemia, unspecified: Secondary | ICD-10-CM | POA: Diagnosis not present

## 2018-08-21 DIAGNOSIS — D689 Coagulation defect, unspecified: Secondary | ICD-10-CM | POA: Diagnosis not present

## 2018-08-21 DIAGNOSIS — K921 Melena: Secondary | ICD-10-CM | POA: Diagnosis not present

## 2018-08-21 DIAGNOSIS — R21 Rash and other nonspecific skin eruption: Secondary | ICD-10-CM | POA: Diagnosis present

## 2018-08-21 DIAGNOSIS — Z9079 Acquired absence of other genital organ(s): Secondary | ICD-10-CM | POA: Diagnosis not present

## 2018-08-21 DIAGNOSIS — K625 Hemorrhage of anus and rectum: Secondary | ICD-10-CM | POA: Diagnosis not present

## 2018-08-21 LAB — COMPREHENSIVE METABOLIC PANEL
ALT: 13 U/L (ref 0–44)
AST: 19 U/L (ref 15–41)
Albumin: 3.1 g/dL — ABNORMAL LOW (ref 3.5–5.0)
Alkaline Phosphatase: 50 U/L (ref 38–126)
Anion gap: 7 (ref 5–15)
BUN: 25 mg/dL — ABNORMAL HIGH (ref 8–23)
CO2: 26 mmol/L (ref 22–32)
Calcium: 9.5 mg/dL (ref 8.9–10.3)
Chloride: 109 mmol/L (ref 98–111)
Creatinine, Ser: 1.32 mg/dL — ABNORMAL HIGH (ref 0.61–1.24)
GFR calc Af Amer: 54 mL/min — ABNORMAL LOW (ref >60)
GFR calc non Af Amer: 47 mL/min — ABNORMAL LOW (ref >60)
Glucose, Bld: 143 mg/dL — ABNORMAL HIGH (ref 70–99)
Potassium: 4.2 mmol/L (ref 3.5–5.1)
Sodium: 142 mmol/L (ref 135–145)
Total Bilirubin: 0.6 mg/dL (ref 0.3–1.2)
Total Protein: 6.1 g/dL — ABNORMAL LOW (ref 6.5–8.1)

## 2018-08-21 LAB — CBC
HCT: 37 % — ABNORMAL LOW (ref 39.0–52.0)
Hemoglobin: 11.2 g/dL — ABNORMAL LOW (ref 13.0–17.0)
MCH: 28.6 pg (ref 26.0–34.0)
MCHC: 30.3 g/dL (ref 30.0–36.0)
MCV: 94.6 fL (ref 80.0–100.0)
Platelets: 146 10*3/uL — ABNORMAL LOW (ref 150–400)
RBC: 3.91 MIL/uL — ABNORMAL LOW (ref 4.22–5.81)
RDW: 14.7 % (ref 11.5–15.5)
WBC: 7.5 10*3/uL (ref 4.0–10.5)
nRBC: 0 % (ref 0.0–0.2)

## 2018-08-21 LAB — TYPE AND SCREEN
ABO/RH(D): A POS
Antibody Screen: NEGATIVE

## 2018-08-21 MED ORDER — POTASSIUM CHLORIDE IN NACL 20-0.9 MEQ/L-% IV SOLN
INTRAVENOUS | Status: DC
  Administered 2018-08-22: 03:00:00 via INTRAVENOUS
  Filled 2018-08-21: qty 1000

## 2018-08-21 MED ORDER — ONDANSETRON HCL 4 MG PO TABS: 4.0000 mg | ORAL_TABLET | Freq: Four times a day (QID) | ORAL | Status: DC | PRN

## 2018-08-21 MED ORDER — METOPROLOL SUCCINATE ER 25 MG PO TB24
50.0000 mg | ORAL_TABLET | Freq: Every day | ORAL | Status: DC
  Administered 2018-08-22 – 2018-08-25 (×4): 50 mg via ORAL
  Filled 2018-08-21 (×4): qty 2

## 2018-08-21 MED ORDER — ATORVASTATIN CALCIUM 10 MG PO TABS
20.0000 mg | ORAL_TABLET | Freq: Every day | ORAL | Status: DC
  Administered 2018-08-22 – 2018-08-24 (×3): 20 mg via ORAL
  Filled 2018-08-21 (×3): qty 2

## 2018-08-21 MED ORDER — ACETAMINOPHEN 325 MG PO TABS: 650.0000 mg | ORAL_TABLET | Freq: Four times a day (QID) | ORAL | Status: DC | PRN

## 2018-08-21 MED ORDER — ONDANSETRON HCL 4 MG/2ML IJ SOLN: 4.0000 mg | Freq: Four times a day (QID) | INTRAMUSCULAR | Status: DC | PRN

## 2018-08-21 MED ORDER — ACETAMINOPHEN 650 MG RE SUPP: 650.0000 mg | Freq: Four times a day (QID) | RECTAL | Status: DC | PRN

## 2018-08-21 NOTE — ED Provider Notes (Signed)
[facility] EMERGENCY DEPARTMENT Provider Note   CSN: [id number] Arrival date & time: 08/21/18  2141    History   Chief Complaint Chief Complaint  Patient presents with  . Rectal Bleeding    HPI GARRET TEALE is a 85 y.o. male.     Patient with a history of embolic CVA, DVT, HLD, HTN presents from home where he is cared for by his daughter [name]) after she found his diaper full of blood and clots this evening. She is not certain when the bleeding started today. She noticed red specs in his stool this morning but thought it appeared like undigested food particles. He did not let her know he soiled another diaper until this evening when she found it full. The patient cannot contribute to significant portions of history. He denies abdominal pain, nausea, vomiting, which daughter confirms. No known fever or respiratory symptoms. No history of GI bleeding in the past.  The history is provided by the patient and a caregiver. No language interpreter was used.  Rectal Bleeding  Associated symptoms: no abdominal pain, no fever and no vomiting     Past Medical History:  Diagnosis Date  . Anemia   . BPH (benign prostatic hypertrophy)    TURP 05/19/13  . Cerebral embolism with cerebral infarction ([place]) 12/19/2013  . Chronic kidney disease    CHRONIC KIDNEY DISEASE, 2  . Difficulty hearing, right    BILATERAL HEARING LOSS - BEST TO TRY TO SPEAK INTO LEFT EAR  . DVT (deep venous thrombosis) ([place])   . Frequent falls   . Hyperlipidemia   . Hypertension   . Incontinence of urine    SOME INCONTINENCE  . Left adrenal mass ([place]) 09/11/2013  . Meningioma ([place])   . Stroke ([place])    Cerebellar, 2013; WALKS WITH WALKER, ABLE TO DRESS AND BATHE HIMSELF BUT FAMILY TRIES TO PROVIDE SUPERVISION BECAUSE OF HIS HX OF FALL AND WEAKNESS LEGS, ARMS   . Thrombocytopenia ([place])   . Vitreous hemorrhage ([place]) 12/21/2013   OS 12/18/13 CT /MRI brain done for ataxia     Patient Active  Problem List   Diagnosis Date Noted  . Chronic diastolic (congestive) heart failure ([place]) 10/28/2017  . Renal cyst 10/28/2017  . Shortness of breath on exertion 10/03/2017  . [place] 10/03/2017  . Generalized weakness 02/19/2017  . 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 Social problem 11/07/2016  . Left ankle swelling 08/03/2016  . Dementia ([place]) 01/28/2016  . Gait instability 01/26/2016  . Hypernatremia   . Hematuria 02/26/2015  . Weakness 02/26/2015  . UTI (urinary tract infection) 07/17/2014  . Presence of IVC filter 07/17/2014  . Right knee pain 05/25/2014  . Long term current use of anticoagulant therapy- Xarelto 03/26/2014  . Onychomycosis 01/15/2014  . Gait abnormality 12/18/2013  . Orthostatic hypotension 07/10/2013  . CKD (chronic kidney disease) stage 3, GFR 30-59 ml/min (HCC) 06/23/2013  . Hyperlipidemia   . Urinary retention 06/15/2013  . Normocytic anemia 06/11/2013  . History of DVT (deep vein thrombosis) 06/10/2013  . Physical deconditioning 05/30/2013  . BPH (benign prostatic hyperplasia) 04/24/2013  . Preoperative examination 04/24/2013  . Health care maintenance 08/30/2012  . Frequent falls 06/23/2012  . Ataxia 09/15/2011  . 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 Hearing loss 07/20/2011  . Meningioma ([place]) 07/20/2011  . HTN (hypertension) 07/19/2011  . CVA (cerebral infarction) 07/19/2011    Past Surgical History:  Procedure Laterality Date  .  CYSTOSCOPY N/A 06/13/2013   Procedure: CYSTOSCOPY FLEXIBLE BEDSIDE;  Surgeon: Ardis Hughs, MD;  Location: [facility];  Service: Urology;  Laterality: N/A;  . MYRINGOTOMY WITH TUBE PLACEMENT Bilateral   . TRANSURETHRAL RESECTION OF PROSTATE N/A 05/19/2013   Procedure: TRANSURETHRAL RESECTION OF THE PROSTATE WITH GYRUS INSTRUMENTS;  Surgeon: Ailene Rud, MD;  Location: [facility];  Service: Urology;  Laterality: N/A;        Home Medications    Prior to  Admission medications   Medication Sig Start Date End Date Taking? Authorizing Provider  amLODipine (NORVASC) 5 MG tablet Take 1 tablet (5 mg total) by mouth daily. 04/26/18   [name], DO  atorvastatin (LIPITOR) 20 MG tablet Take 1 tablet (20 mg total) by mouth daily at 6 PM. 04/26/18   [name], DO  furosemide (LASIX) 40 MG tablet Take 1 tablet (40 mg total) by mouth every Monday, Wednesday, and Friday. 01/25/18 01/20/19  [name], DO  irbesartan (AVAPRO) 300 MG tablet Take 1 tablet (300 mg total) by mouth daily. 04/26/18   [name], DO  metoprolol succinate (TOPROL XL) 50 MG 24 hr tablet Take 1 tablet (50 mg total) by mouth daily. Take with or immediately following a meal. 04/26/18 04/26/19  [name], DO  rivaroxaban (XARELTO) 10 MG TABS tablet Take 1 tablet (10 mg total) by mouth daily. 01/24/18   [name], DO  triamcinolone ointment (KENALOG) 0.5 % Apply 1 application topically 2 (two) times daily. 07/18/18   [name], DO    Family History Family History  Problem Relation Age of Onset  . Diabetes Mother   . Heart disease Mother   . Hypertension Mother   . Heart disease Father   . Hypertension Father   . Hypertension Daughter     Social History Social History   Tobacco Use  . Smoking status: Never Smoker  . Smokeless tobacco: Never Used  Substance Use Topics  . Alcohol use: No    Alcohol/week: 0.0 standard drinks  . Drug use: No     Allergies   Patient has no known allergies.   Review of Systems Review of Systems  Constitutional: Negative for chills and fever.  HENT: Negative.   Respiratory: Negative.   Cardiovascular: Negative.   Gastrointestinal: Positive for blood in stool and hematochezia. Negative for abdominal pain and vomiting.  Musculoskeletal: Negative.   Skin: Negative.  Negative for pallor.  Neurological: Negative.  Negative for syncope and weakness.     Physical Exam Updated Vital Signs BP (!) 158/97 (BP  Location: Right Arm)   Pulse 66   Temp 98.7 F (37.1 C) (Oral)   Resp 18   SpO2 98%   Physical Exam Vitals signs and nursing note reviewed.  Constitutional:      Appearance: He is well-developed.  HENT:     Head: Normocephalic.  Neck:     Musculoskeletal: Normal range of motion and neck supple.  Cardiovascular:     Rate and Rhythm: Normal rate and regular rhythm.  Pulmonary:     Effort: Pulmonary effort is normal.     Breath sounds: Normal breath sounds.  Abdominal:     General: Bowel sounds are normal.     Palpations: Abdomen is soft.     Tenderness: There is no abdominal tenderness. There is no guarding or rebound.  Genitourinary:    Comments: Red blood found at the rectum mixed with normal color stool.  Musculoskeletal: Normal  range of motion.  Skin:    General: Skin is warm and dry.     Coloration: Skin is not pale.  Neurological:     Mental Status: He is alert.     Comments: Speech is slurred - baseline per daughter.      ED Treatments / Results  Labs (all labs ordered are listed, but only abnormal results are displayed) Labs Reviewed  CBC - Abnormal; Notable for the following components:      Result Value   RBC 3.91 (*)    Hemoglobin 11.2 (*)    HCT 37.0 (*)    Platelets 146 (*)    All other components within normal limits  SARS CORONAVIRUS 2 (HOSPITAL ORDER, [facility])  COMPREHENSIVE METABOLIC PANEL  POC OCCULT BLOOD, ED  TYPE AND SCREEN    EKG None  Radiology No results found.  Procedures Procedures (including critical care time)  Medications Ordered in ED Medications - No data to display   Initial Impression / Assessment and Plan / ED Course  I have reviewed the triage vital signs and the nursing notes.  Pertinent labs & imaging results that were available during my care of the patient were reviewed by me and considered in my medical decision making (see chart for details).        Patient to ED after his  daughter/caregiver found his diaper full of blood and clots this evening. Per her history, the patient has had no history of GI bleeding, no complaint today of pain, no vomiting or fever, has had a normal appetite. Patient is on Xarelto for history of DVT.  The patient is very pleasant, in NAD. He is found to have blood present at the rectum with normal brown stool present as well.   VSS, he is hemodynamically stable. Hgb 11.2. Discussed his condition with Internal Medicine admitting resident who accepts for admission.   COVID-19 Cepheid ordered as colonoscopy is anticipated tomorrow.   Final Clinical Impressions(s) / ED Diagnoses   Final diagnoses:  None   1. Lower GI bleed 2. Coagulopathy  ED Discharge Orders    None       Charlann Lange, Hershal Coria 08/21/18 2254    Margette Fast, MD 08/22/18 1154

## 2018-08-21 NOTE — H&P (Signed)
Date: 08/21/2018               Patient Name:  [name] MRN: [id number]  DOB: Jul 13, 1924 Age / Sex: 85 y.o., male   PCP: [name], DO         Medical Service: Internal Medicine Teaching Service         Attending Physician: Dr. Laverta Baltimore Wonda Olds, MD    First Contact: Dr. Truman Hayward Pager: [phone]  Second Contact: Dr. Berline Lopes Pager: [phone]       After Hours (After 5p/  First Contact Pager: [phone]  weekends / holidays): Second Contact Pager: [phone]   Chief Complaint: Blood in his diaper  History of Present Illness: [name] is a 85 y.o male with a history of prior DVT on Xarelto, HTN, dCHF and cerebellar ataxia, dementia, meningioma, and CKD III who presented to the ED with complaints of bright red blood per rectum. History was obtained from the patient, the patient's daughter [name]), and the chart.   Daughter states she noticed a "speck of fecal matter" on the floor and asked the patient if he soiled himself. He said he had and when she went to change him she noticed his diaper was full of blood all the way up to his back and clots. At that point she realized the speck on the floor was blood. She states that he denied any abdominal discomfort or pain. No history of GI bleeds and she is not sure about his prior colonoscopies. At baseline, since his stroke in 2013 the patient relies on the daughter for everything except he is able to feed himself. She states she is able to understand him but most people have a difficult time understanding his speech. He has bilateral hearing loss.   Meds:  No current facility-administered medications on file prior to encounter.    Current Outpatient Medications on File Prior to Encounter  Medication Sig Dispense Refill  . amLODipine (NORVASC) 5 MG tablet Take 1 tablet (5 mg total) by mouth daily. 90 tablet 3  . atorvastatin (LIPITOR) 20 MG tablet Take 1 tablet (20 mg total) by mouth daily at 6 PM. 90 tablet 3  . furosemide (LASIX)  40 MG tablet Take 1 tablet (40 mg total) by mouth every Monday, Wednesday, and Friday. 30 tablet 4  . irbesartan (AVAPRO) 300 MG tablet Take 1 tablet (300 mg total) by mouth daily. 90 tablet 3  . metoprolol succinate (TOPROL XL) 50 MG 24 hr tablet Take 1 tablet (50 mg total) by mouth daily. Take with or immediately following a meal. 90 tablet 3  . rivaroxaban (XARELTO) 10 MG TABS tablet Take 1 tablet (10 mg total) by mouth daily. 90 tablet 3  . triamcinolone ointment (KENALOG) 0.5 % Apply 1 application topically 2 (two) times daily. 30 g 2   Allergies: Allergies as of 08/21/2018  . (No Known Allergies)   Past Medical History:  Diagnosis Date  . Anemia   . BPH (benign prostatic hypertrophy)    TURP 05/19/13  . Cerebral embolism with cerebral infarction ([place]) 12/19/2013  . Chronic kidney disease    CHRONIC KIDNEY DISEASE, 2  . Difficulty hearing, right    BILATERAL HEARING LOSS - BEST TO TRY TO SPEAK INTO LEFT EAR  . DVT (deep venous thrombosis) ([place])   . Frequent falls   . Hyperlipidemia   . Hypertension   . Incontinence of urine    SOME INCONTINENCE  . Left adrenal mass (HCC)  09/11/2013  . Meningioma ([place])   . Stroke ([place])    Cerebellar, 2013; WALKS WITH WALKER, ABLE TO DRESS AND BATHE HIMSELF BUT FAMILY TRIES TO PROVIDE SUPERVISION BECAUSE OF HIS HX OF FALL AND WEAKNESS LEGS, ARMS   . Thrombocytopenia ([place])   . Vitreous hemorrhage ([place]) 12/21/2013   OS 12/18/13 CT /MRI brain done for ataxia    Family History:  Family History  Problem Relation Age of Onset  . Diabetes Mother   . Heart disease Mother   . Hypertension Mother   . Heart disease Father   . Hypertension Father   . Hypertension Daughter     Social History:  Social History   Socioeconomic History  . Marital status: Widowed    Spouse name: Not on file  . Number of children: Not on file  . Years of education: Not on file  . Highest education level: Not on file  Occupational History  . Not on file  Social Needs   . Financial resource strain: Not on file  . Food insecurity:    Worry: Not on file    Inability: Not on file  . Transportation needs:    Medical: Not on file    Non-medical: Not on file  Tobacco Use  . Smoking status: Never Smoker  . Smokeless tobacco: Never Used  Substance and Sexual Activity  . Alcohol use: No    Alcohol/week: 0.0 standard drinks  . Drug use: No  . Sexual activity: Never  Lifestyle  . Physical activity:    Days per week: Not on file    Minutes per session: Not on file  . Stress: Not on file  Relationships  . Social connections:    Talks on phone: Not on file    Gets together: Not on file    Attends religious service: Not on file    Active member of club or organization: Not on file    Attends meetings of clubs or organizations: Not on file    Relationship status: Not on file  . Intimate partner violence:    Fear of current or ex partner: Not on file    Emotionally abused: Not on file    Physically abused: Not on file    Forced sexual activity: Not on file  Other Topics Concern  . Not on file  Social History Narrative   Lives with daughter. Ambulates w walker. Can make basic meals (cereal). Very hard of hearing.     Review of Systems: A complete ROS was negative except as per HPI.   Physical Exam: Blood pressure (!) 158/97, pulse 66, temperature 98.7 F (37.1 C), temperature source Oral, resp. rate 18, SpO2 98 %.  Physical Exam  Constitutional: He is oriented to person, place, and time and well-developed, well-nourished, and in no distress.  Eyes: Conjunctivae are normal.  Arcus senilis  Cardiovascular: Normal rate, regular rhythm and normal heart sounds. Exam reveals no gallop and no friction rub.  No murmur heard. Pulmonary/Chest: Effort normal and breath sounds normal. No respiratory distress. He has no wheezes. He has no rales.  Abdominal: Soft. Bowel sounds are normal. He exhibits no distension. There is no abdominal tenderness. There is  no rebound.  Musculoskeletal:        General: No tenderness or edema.  Neurological: He is alert and oriented to person, place, and time.  Skin: Skin is warm and dry. Rash noted. There is erythema.  Erythematous, non-blanching petechiae on bilateral LE up to knees  EKG: n/a  CXR: n/a  Assessment & Plan by Problem: Active Problems:   Lower GI bleed  [name] is a 85 y.o male with a history of prior DVT on Xarelto, HTN, dCHF and cerebellar ataxia, dementia, meningioma, and CKD III who presented to the ED with complaints of bright red blood per rectum.  Lower GI Bleed: Presented with bright red blood per rectum. Per daughter, patient denied any abdominal pain or discomfort. Hgb 11.2. No history of GI bleeds. Unknown colonoscopy history. Likely a diverticular bleed. - H&H q6h  - Follow up FOBT  - IVF at 75 ml/hr  - Consult GI in the am   Hx of DVT: hold xarelto for now in the setting of GI bleed  - IVC filter in place since 2015  HTN: Home meds include amlodipine 5 mg, irbesartan 300 mg qd and metoprolol 50 mg qd  - Continued amlodipine 5 mg   CKD III: Cr 1.3 on admission near baseline  - Avoid nephrotoxic agents  - Trend BMP   HFpEF: on furosemide 40 mg MWF, hold for now   Hx of CVA: cerebellar stroke in 2013  Diet: NPO VTE ppx: SCDs FULL CODE   Dispo: Admit patient to Inpatient with expected length of stay greater than 2 midnights.  Signed: Mike Craze, DO 08/21/2018, 11:24 PM

## 2018-08-21 NOTE — ED Notes (Signed)
ED TO INPATIENT HANDOFF REPORT   ED Nurse Name and Phone #: [phone]  S Name/Age/Gender [name] 85 y.o. male Room/Bed: 031C/031C  Code Status   Code Status: Prior  Home/SNF/Other Home AO x 4   Triage Complete: Triage complete  Chief Complaint Rectal Bleed   Triage Note BIB EMS from home. Per family rectal bleeding since 1830, has bled through entire brief with large clots. Pt does take Xarelto. Denies abd pain, VSS.    Allergies No Known Allergies  Level of Care/Admitting Diagnosis ED Disposition    ED Disposition Condition Comment   Admit  The patient appears reasonably stabilized for admission considering the current resources, flow, and capabilities available in the ED at this time, and I doubt any other [facility] requiring further screening and/or treatment in the ED prior to admission is  present.       B Medical/Surgery History Past Medical History:  Diagnosis Date  . Anemia   . BPH (benign prostatic hypertrophy)    TURP 05/19/13  . Cerebral embolism with cerebral infarction ([place]) 12/19/2013  . Chronic kidney disease    CHRONIC KIDNEY DISEASE, 2  . Difficulty hearing, right    BILATERAL HEARING LOSS - BEST TO TRY TO SPEAK INTO LEFT EAR  . DVT (deep venous thrombosis) ([place])   . Frequent falls   . Hyperlipidemia   . Hypertension   . Incontinence of urine    SOME INCONTINENCE  . Left adrenal mass ([place]) 09/11/2013  . Meningioma ([place])   . Stroke ([place])    Cerebellar, 2013; WALKS WITH WALKER, ABLE TO DRESS AND BATHE HIMSELF BUT FAMILY TRIES TO PROVIDE SUPERVISION BECAUSE OF HIS HX OF FALL AND WEAKNESS LEGS, ARMS   . Thrombocytopenia ([place])   . Vitreous hemorrhage ([place]) 12/21/2013   OS 12/18/13 CT /MRI brain done for ataxia    Past Surgical History:  Procedure Laterality Date  . CYSTOSCOPY N/A 06/13/2013   Procedure: CYSTOSCOPY FLEXIBLE BEDSIDE;  Surgeon: Ardis Hughs, MD;  Location: [place];  Service: Urology;  Laterality: N/A;  . MYRINGOTOMY WITH TUBE  PLACEMENT Bilateral   . TRANSURETHRAL RESECTION OF PROSTATE N/A 05/19/2013   Procedure: TRANSURETHRAL RESECTION OF THE PROSTATE WITH GYRUS INSTRUMENTS;  Surgeon: Ailene Rud, MD;  Location: [facility];  Service: Urology;  Laterality: N/A;     A IV Location/Drains/Wounds Patient Lines/Drains/Airways Status   Active Line/Drains/Airways    Name:   Placement date:   Placement time:   Site:   Days:   Peripheral IV 08/21/18 Right Hand   08/21/18    2218    Hand   less than 1          Intake/Output Last 24 hours No intake or output data in the 24 hours ending 08/21/18 2314  Labs/Imaging Results for orders placed or performed during the hospital encounter of 08/21/18 (from the past 48 hour(s))  Comprehensive metabolic panel     Status: Abnormal   Collection Time: 08/21/18 10:05 PM  Result Value Ref Range   Sodium 142 135 - 145 mmol/L   Potassium 4.2 3.5 - 5.1 mmol/L   Chloride 109 98 - 111 mmol/L   CO2 26 22 - 32 mmol/L   Glucose, Bld 143 (H) 70 - 99 mg/dL   BUN 25 (H) 8 - 23 mg/dL   Creatinine, Ser 1.32 (H) 0.61 - 1.24 mg/dL   Calcium 9.5 8.9 - 10.3 mg/dL   Total Protein 6.1 (L) 6.5 - 8.1 g/dL   Albumin  3.1 (L) 3.5 - 5.0 g/dL   AST 19 15 - 41 U/L   ALT 13 0 - 44 U/L   Alkaline Phosphatase 50 38 - 126 U/L   Total Bilirubin 0.6 0.3 - 1.2 mg/dL   GFR calc non Af Amer 47 (L) >60 mL/min   GFR calc Af Amer 54 (L) >60 mL/min   Anion gap 7 5 - 15    Comment: Performed at [facility] [address] [place], [place] [address]  CBC     Status: Abnormal   Collection Time: 08/21/18 10:05 PM  Result Value Ref Range   WBC 7.5 4.0 - 10.5 K/uL   RBC 3.91 (L) 4.22 - 5.81 MIL/uL   Hemoglobin 11.2 (L) 13.0 - 17.0 g/dL   HCT 37.0 (L) 39.0 - 52.0 %   MCV 94.6 80.0 - 100.0 fL   MCH 28.6 26.0 - 34.0 pg   MCHC 30.3 30.0 - 36.0 g/dL   RDW 14.7 11.5 - 15.5 %   Platelets 146 (L) 150 - 400 K/uL   nRBC 0.0 0.0 - 0.2 %    Comment: Performed at [facility], [place] [address]  [place], [place] [address]  Type and screen [place]     Status: None (Preliminary result)   Collection Time: 08/21/18 10:05 PM  Result Value Ref Range   ABO/RH(D) PENDING    Antibody Screen PENDING    Sample Expiration      08/24/2018,2359 Performed at [facility], [place] [address], [place], [place] [address]    No results found.  Pending Labs [employer] (From admission, onward)    Start     Ordered   08/21/18 2240  SARS Coronavirus 2 (CEPHEID - Performed in [place] hospital lab), Hosp Order  (Asymptomatic Patients Labs)  Once,   R    Question:  Rule Out  Answer:  Yes   08/21/18 2240          Vitals/Pain Today's Vitals   08/21/18 2152 08/21/18 2157  BP:  (!) 158/97  Pulse:  66  Resp:  18  Temp:  98.7 F (37.1 C)  TempSrc:  Oral  SpO2:  98%  PainSc: 0-No pain     Isolation Precautions No active isolations  Medications Medications - No data to display  Mobility Complete care High fall risk   Focused Assessments Bright red blood clots on stool, pt on Xarelto, pt AO x 4, SR on Monitor no NAD noticed.  R Recommendations: See Admitting Provider Note  Report given to:   Additional Notes:

## 2018-08-21 NOTE — ED Triage Notes (Signed)
BIB EMS from home. Per family rectal bleeding since 1830, has bled through entire brief with large clots. Pt does take Xarelto. Denies abd pain, VSS.

## 2018-08-21 NOTE — ED Notes (Signed)
Bright clots present on pt's diaper. Up to his lower back.

## 2018-08-22 DIAGNOSIS — Z95828 Presence of other vascular implants and grafts: Secondary | ICD-10-CM

## 2018-08-22 DIAGNOSIS — Z79899 Other long term (current) drug therapy: Secondary | ICD-10-CM

## 2018-08-22 DIAGNOSIS — R471 Dysarthria and anarthria: Secondary | ICD-10-CM

## 2018-08-22 DIAGNOSIS — D62 Acute posthemorrhagic anemia: Secondary | ICD-10-CM

## 2018-08-22 DIAGNOSIS — Z86718 Personal history of other venous thrombosis and embolism: Secondary | ICD-10-CM

## 2018-08-22 DIAGNOSIS — N183 Chronic kidney disease, stage 3 (moderate): Secondary | ICD-10-CM

## 2018-08-22 DIAGNOSIS — F0390 Unspecified dementia without behavioral disturbance: Secondary | ICD-10-CM

## 2018-08-22 DIAGNOSIS — K922 Gastrointestinal hemorrhage, unspecified: Secondary | ICD-10-CM

## 2018-08-22 DIAGNOSIS — Z7901 Long term (current) use of anticoagulants: Secondary | ICD-10-CM

## 2018-08-22 DIAGNOSIS — G119 Hereditary ataxia, unspecified: Secondary | ICD-10-CM

## 2018-08-22 DIAGNOSIS — I5032 Chronic diastolic (congestive) heart failure: Secondary | ICD-10-CM

## 2018-08-22 DIAGNOSIS — D329 Benign neoplasm of meninges, unspecified: Secondary | ICD-10-CM

## 2018-08-22 DIAGNOSIS — I130 Hypertensive heart and chronic kidney disease with heart failure and stage 1 through stage 4 chronic kidney disease, or unspecified chronic kidney disease: Secondary | ICD-10-CM

## 2018-08-22 LAB — BASIC METABOLIC PANEL WITH GFR
Anion gap: 6 (ref 5–15)
BUN: 22 mg/dL (ref 8–23)
CO2: 27 mmol/L (ref 22–32)
Calcium: 9.2 mg/dL (ref 8.9–10.3)
Chloride: 110 mmol/L (ref 98–111)
Creatinine, Ser: 1.32 mg/dL — ABNORMAL HIGH (ref 0.61–1.24)
GFR calc Af Amer: 54 mL/min — ABNORMAL LOW
GFR calc non Af Amer: 47 mL/min — ABNORMAL LOW
Glucose, Bld: 149 mg/dL — ABNORMAL HIGH (ref 70–99)
Potassium: 4.7 mmol/L (ref 3.5–5.1)
Sodium: 143 mmol/L (ref 135–145)

## 2018-08-22 LAB — HEMOGLOBIN AND HEMATOCRIT, BLOOD
HCT: 32.3 % — ABNORMAL LOW (ref 39.0–52.0)
HCT: 30.3 % — ABNORMAL LOW (ref 39.0–52.0)
HCT: 29.9 % — ABNORMAL LOW (ref 39.0–52.0)
HCT: 27.9 % — ABNORMAL LOW (ref 39.0–52.0)
Hemoglobin: 9.9 g/dL — ABNORMAL LOW (ref 13.0–17.0)
Hemoglobin: 9.1 g/dL — ABNORMAL LOW (ref 13.0–17.0)
Hemoglobin: 8.9 g/dL — ABNORMAL LOW (ref 13.0–17.0)
Hemoglobin: 8.5 g/dL — ABNORMAL LOW (ref 13.0–17.0)

## 2018-08-22 LAB — SARS CORONAVIRUS 2 BY RT PCR (HOSPITAL ORDER, PERFORMED IN ~~LOC~~ HOSPITAL LAB): SARS Coronavirus 2: NEGATIVE

## 2018-08-22 MED ORDER — PANTOPRAZOLE SODIUM 40 MG IV SOLR
40.0000 mg | Freq: Two times a day (BID) | INTRAVENOUS | Status: DC
  Administered 2018-08-22 – 2018-08-25 (×7): 40 mg via INTRAVENOUS
  Filled 2018-08-22 (×7): qty 40

## 2018-08-22 NOTE — Progress Notes (Signed)
   Subjective:  [name] is a 85 y.o. M with PMH of prior DVT on Xarelto, HTN, dCHF and cerebellar ataxia, dementia, meningioma, and CKD III admit for BRBPR on hospital day 1  [name] was examined at bedside this morning and states he is doing well. He denies abdominal pain. He is unsure if he has had a bowel movement or if he has had any further episodes of hematochezia but denies having used the bathroom since admission. He is quite a pleasant gentleman. We made him aware that we will contact GI regarding his recent onset hematochezia/BRBPR   Objective:  Vital signs in last 24 hours: Vitals:   08/21/18 2315 08/22/18 0256 08/22/18 0629 08/22/18 0947  BP: (!) 150/92 (!) 148/87 (!) 152/88 140/85  Pulse: 64 (!) 59 (!) 59 62  Resp: (!) 22 18 20 16   Temp:  98.2 F (36.8 C) 98.1 F (36.7 C) 97.9 F (36.6 C)  TempSrc:  Oral Oral Oral  SpO2: 96% 97% 94% 97%  Weight:  100.7 kg    Height:  6' (1.829 m)     Gen: Well-developed, well nourished, NAD HEENT: EOMI, PERRL, Hard of hearing, MMM Neck: supple, ROM intact CV: RRR, S1, S2 normal, No rubs, no murmurs, no gallops Pulm: CTAB, No rales, no wheezes Abd: Soft, BS+, NTND, No rebound, no guarding Extm: ROM intact, Peripheral pulses intact, No peripheral edema GU: No obvious further bleeding from rectum Skin: Dry, Warm, normal turgor, no pallor, bilateral lower extremity blanching rash pictured below Neuro: AAOx3, Known dysarthria       Assessment/Plan:  Active Problems:   Lower GI bleed   [name] is a 85 y.o. M with PMH of prior DVT on Xarelto, HTN, dCHF and cerebellar ataxia, dementia, meningioma, and CKD III admit for BRBPR. Unclear etiology of bleed but could be diverticular bleed vs colonic mass while on Xarelto. No history of colonoscopy noted on chart review. No prior abdominal imaging on review. Despite his age, appears to be in good health with no significant cardiac or pulmonary disease. He may tolerate  colonoscopy.  Anemia 2/2 Lower GI Bleed BRBPR noted on ED exam and per family report. Hgb trending down 11.2->9.9->9.1. Did receive gentle fluid resuscitation while NPO. No hx of GI work-up in the past. - GI consult - Trend H&H - C/w gentle fluid resuscitation: NS 75cc/hr - Transfuse if hgb < 7  Chronic diastolic heart failure  TTE 10/2017 EF 65-70 w/ grade 1 diastolic dysfunction, mild aortic stenosis. Euvolemic on exam. - Hold home lasix in active bleed. - C/w home meds: metoprolol succinate 50mg  daily, atorvastatin 20mg  daily  Hx of DVT Had IVC filter palced in 2015 - Hold home xarelto in setting of bleed  HTN Am bp 140/85 - C/w home meds: amlodipine 5mg    DVT prophx: SCDs Diet: NPO Code: Full  Dispo: Anticipated discharge in approximately 2-3 day(s).   Jimmy Anis, MD 08/22/2018, 12:12 PM Pager: [phone]

## 2018-08-22 NOTE — Consult Note (Signed)
[facility] Consultation Note  Referring Provider:  Internal Medicine Teaching Service Primary Care Physician:  [name], DO  Reason for Consultation:  GI bleeding  HPI: [name] is a 85 y.o. male admitted with GI bleeding.  Patient can provide very little history; bulk of history obtained after telephone conversation with his daughter, [name].  Patient reportedly started having hematochezia with red blood and clots yesterday late evening.  Is on rivaroxaban for history of DVT, last dose late yesterday evening right before EMS was called to take patient to hospital.  No reportedly hematemesis or abdominal pain or NSAID use.  [name] states her dad has never had GI bleeding to her knowledge, and doesn't think he's ever had an endoscopy or colonoscopy.  Per nursing, patient had a couple bloody stools since admission overnight and one large bloody stool this morning.  Past Medical History:  Diagnosis Date  . Anemia   . BPH (benign prostatic hypertrophy)    TURP 05/19/13  . Cerebral embolism with cerebral infarction ([place]) 12/19/2013  . Chronic kidney disease    CHRONIC KIDNEY DISEASE, 2  . Difficulty hearing, right    BILATERAL HEARING LOSS - BEST TO TRY TO SPEAK INTO LEFT EAR  . DVT (deep venous thrombosis) ([place])   . Frequent falls   . Hyperlipidemia   . Hypertension   . Incontinence of urine    SOME INCONTINENCE  . Left adrenal mass ([place]) 09/11/2013  . Meningioma ([place])   . Stroke ([place])    Cerebellar, 2013; WALKS WITH WALKER, ABLE TO DRESS AND BATHE HIMSELF BUT FAMILY TRIES TO PROVIDE SUPERVISION BECAUSE OF HIS HX OF FALL AND WEAKNESS LEGS, ARMS   . Thrombocytopenia ([place])   . Vitreous hemorrhage ([place]) 12/21/2013   OS 12/18/13 CT /MRI brain done for ataxia     Past Surgical History:  Procedure Laterality Date  . CYSTOSCOPY N/A 06/13/2013   Procedure: CYSTOSCOPY FLEXIBLE BEDSIDE;  Surgeon: Ardis Hughs, MD;  Location: [place] [facility];  Service: Urology;  Laterality:  N/A;  . MYRINGOTOMY WITH TUBE PLACEMENT Bilateral   . TRANSURETHRAL RESECTION OF PROSTATE N/A 05/19/2013   Procedure: TRANSURETHRAL RESECTION OF THE PROSTATE WITH GYRUS INSTRUMENTS;  Surgeon: Ailene Rud, MD;  [facility];  Service: Urology;  Laterality: N/A;    Prior to Admission medications   Medication Sig Start Date End Date Taking? Authorizing Provider  amLODipine (NORVASC) 5 MG tablet Take 1 tablet (5 mg total) by mouth daily. 04/26/18   [name], DO  atorvastatin (LIPITOR) 20 MG tablet Take 1 tablet (20 mg total) by mouth daily at 6 PM. 04/26/18   [name], DO  furosemide (LASIX) 40 MG tablet Take 1 tablet (40 mg total) by mouth every Monday, Wednesday, and Friday. 01/25/18 01/20/19  [name], DO  irbesartan (AVAPRO) 300 MG tablet Take 1 tablet (300 mg total) by mouth daily. 04/26/18   [name], DO  metoprolol succinate (TOPROL XL) 50 MG 24 hr tablet Take 1 tablet (50 mg total) by mouth daily. Take with or immediately following a meal. 04/26/18 04/26/19  [name], DO  rivaroxaban (XARELTO) 10 MG TABS tablet Take 1 tablet (10 mg total) by mouth daily. 01/24/18   [name], DO  triamcinolone ointment (KENALOG) 0.5 % Apply 1 application topically 2 (two) times daily. 07/18/18   [name], DO    Current Facility-Administered Medications  Medication Dose Route Frequency Provider Last Rate Last Dose  .  0.9 % NaCl with KCl 20 mEq/ L  infusion   Intravenous Continuous Ina Homes, MD 75 mL/hr at 08/22/18 0323    . acetaminophen (TYLENOL) tablet 650 mg  650 mg Oral Q6H PRN Ina Homes, MD       Or  . acetaminophen (TYLENOL) suppository 650 mg  650 mg Rectal Q6H PRN Ina Homes, MD      . atorvastatin (LIPITOR) tablet 20 mg  20 mg Oral q1800 Helberg, Justin, MD      . metoprolol succinate (TOPROL-XL) 24 hr tablet 50 mg  50 mg Oral Daily Ina Homes, MD   50 mg at 08/22/18 0956  . ondansetron (ZOFRAN) tablet 4 mg  4 mg Oral Q6H  PRN Ina Homes, MD       Or  . ondansetron (ZOFRAN) injection 4 mg  4 mg Intravenous Q6H PRN Ina Homes, MD        Allergies as of 08/21/2018  . (No Known Allergies)    Family History  Problem Relation Age of Onset  . Diabetes Mother   . Heart disease Mother   . Hypertension Mother   . Heart disease Father   . Hypertension Father   . Hypertension Daughter     Social History   Socioeconomic History  . Marital status: Widowed    Spouse name: Not on file  . Number of children: Not on file  . Years of education: Not on file  . Highest education level: Not on file  Occupational History  . Not on file  Social Needs  . Financial resource strain: Not on file  . Food insecurity    Worry: Not on file    Inability: Not on file  . Transportation needs    Medical: Not on file    Non-medical: Not on file  Tobacco Use  . Smoking status: Never Smoker  . Smokeless tobacco: Never Used  Substance and Sexual Activity  . Alcohol use: No    Alcohol/week: 0.0 standard drinks  . Drug use: No  . Sexual activity: Never  Lifestyle  . Physical activity    Days per week: Not on file    Minutes per session: Not on file  . Stress: Not on file  Relationships  . Social Herbalist on phone: Not on file    Gets together: Not on file    Attends religious service: Not on file    Active member of club or organization: Not on file    Attends meetings of clubs or organizations: Not on file    Relationship status: Not on file  . Intimate partner violence    Fear of current or ex partner: Not on file    Emotionally abused: Not on file    Physically abused: Not on file    Forced sexual activity: Not on file  Other Topics Concern  . Not on file  Social History Narrative   Lives with daughter. Ambulates w walker. Can make basic meals (cereal). Very hard of hearing.     Review of Systems: Unable to obtain due to patient's demented state  Physical Exam: Vital signs in  last 24 hours: Temp:  [97.9 F (36.6 C)-98.7 F (37.1 C)] 97.9 F (36.6 C) (06/11 0947) Pulse Rate:  [59-66] 62 (06/11 0947) Resp:  [16-22] 16 (06/11 0947) BP: (140-158)/(85-97) 140/85 (06/11 0947) SpO2:  [89 %-98 %] 97 % (06/11 0947) Weight:  [100.7 kg] 100.7 kg (06/11 0256) Last BM Date: 08/21/18 General:  Alert,  Unable to answer many questions Head:  Normocephalic and atraumatic. Eyes:  Sclera clear, no icterus.   Conjunctiva pale Ears:  Normal auditory acuity. Nose:  No deformity, discharge,  or lesions. Mouth:  No deformity or lesions.  Oropharynx pale and dry Neck:  Supple; no masses or thyromegaly. Lungs:  Clear throughout to auscultation.   No wheezes, crackles, or rhonchi. No acute distress. Heart:  Regular rate and rhythm; no murmurs, clicks, rubs,  or gallops. Abdomen:  Soft, protuberant, nontender and nondistended. No masses, hepatosplenomegaly or hernias noted. Normal bowel sounds, without guarding, and without rebound.     Msk:  Symmetrical without gross deformities. Normal posture. Pulses:  Normal pulses noted. Extremities:  Without clubbing or edema. Neurologic: Demented, but grossly normal neurologically. Skin:  Intact without significant lesions or rashes. Psych:  Demented   Lab Results: Recent Labs    08/21/18 2205 08/22/18 0311 08/22/18 1059  WBC 7.5  --   --   HGB 11.2* 9.9* 9.1*  HCT 37.0* 32.3* 30.3*  PLT 146*  --   --    BMET Recent Labs    08/21/18 2205 08/22/18 0311  NA 142 143  K 4.2 4.7  CL 109 110  CO2 26 27  GLUCOSE 143* 149*  BUN 25* 22  CREATININE 1.32* 1.32*  CALCIUM 9.5 9.2   LFT Recent Labs    08/21/18 2205  PROT 6.1*  ALBUMIN 3.1*  AST 19  ALT 13  ALKPHOS 50  BILITOT 0.6   PT/INR No results for input(s): LABPROT, INR in the last 72 hours.  Studies/Results: No results found.  Impression:  1.  Hematochezia.  Suspect diverticular.  Less likely upper GI source, but mindful of slight elevation of BUN.   Hemodynamics ok (SBP 140-150s, HR 60s but on BB at home). 2.  Anemia, acute on chronic, with clear blood loss component. 3.  Multiple medical problems (stroke, chronic kidney disease). 4.  Dementia. 5.  History DVT, on rivaroxaban last dose yesterday late evening (<24 hours ago).  Plan:  1.  Rivaroxaban on hold. 2.  Serial CBCs, blood transfusions as needed, volume repletion as needed. 3.  Will start PPI now. 4.  If bleeding persists overnight, consider tagged RBC study, which might help localize source of bleeding (upper versus lower), but regardless of findings (unless he has destabilizing bleeding, which he currently does not have) we would not pursue any type of endoscopy or IR-angiogram type evaluations until at least tomorrow, when it would have been at least 36 hours since his last dose of rivaroxaban. 5.  Sips clear liquids OK for now. 6.  Case discussed at length over telephone with daughter, [name]. 7.  [facility] will follow.   LOS: 1 day   Madisson Kulaga M  08/22/2018, 1:39 PM  Cell [phone] If no answer or after 5 PM call [phone]

## 2018-08-22 NOTE — Progress Notes (Signed)
  Date: 08/22/2018  Patient name: [name]  Medical record number: [id number]  Date of birth: 12-Aug-1924   I have seen and evaluated this patient and I have discussed the plan of care with the house staff. Please see their note for complete details. I concur with their findings with the following additions/corrections:   85 year old man admitted with hematochezia.  Seen and examined on rounds with the team this morning.  He was lying in bed and appeared comfortable in no distress.  His speech is dysarthric and difficult to understand, but he was able to communicate that he feels well with no complaints.  He is not aware of any further bowel movements since admission.  Hgb has dropped from 11 to 9.9 to 9.1.  Vitals remain normal.  We have held his rivaroxaban and consulted GI.  Appreciate the recommendations, any further bleeding may prompt tagged RBC scan or angiography to help localize bleeding, but no plan for endoscopy until at least 36 hours after his last rivaroxaban dose.  Lenice Pressman, M.D., Ph.D. 08/22/2018, 3:07 PM

## 2018-08-22 NOTE — Addendum Note (Signed)
Addended by: Yvonna Alanis E on: 08/22/2018 06:30 PM   Modules accepted: Orders

## 2018-08-22 NOTE — Plan of Care (Signed)

## 2018-08-22 NOTE — Plan of Care (Signed)
  Problem: Clinical Measurements: Goal: Diagnostic test results will improve Outcome: Progressing   Problem: Activity: Goal: Risk for activity intolerance will decrease Outcome: Progressing   Problem: Nutrition: Goal: Adequate nutrition will be maintained Outcome: Progressing   Problem: Elimination: Goal: Will not experience complications related to bowel motility Outcome: Progressing   Problem: Pain Managment: Goal: General experience of comfort will improve Outcome: Progressing   Problem: Skin Integrity: Goal: Risk for impaired skin integrity will decrease Outcome: Progressing

## 2018-08-23 LAB — BASIC METABOLIC PANEL
Anion gap: 5 (ref 5–15)
BUN: 22 mg/dL (ref 8–23)
CO2: 28 mmol/L (ref 22–32)
Calcium: 8.7 mg/dL — ABNORMAL LOW (ref 8.9–10.3)
Chloride: 110 mmol/L (ref 98–111)
Creatinine, Ser: 1.25 mg/dL — ABNORMAL HIGH (ref 0.61–1.24)
GFR calc Af Amer: 58 mL/min — ABNORMAL LOW (ref >60)
GFR calc non Af Amer: 50 mL/min — ABNORMAL LOW (ref >60)
Glucose, Bld: 115 mg/dL — ABNORMAL HIGH (ref 70–99)
Potassium: 4.6 mmol/L (ref 3.5–5.1)
Sodium: 143 mmol/L (ref 135–145)

## 2018-08-23 LAB — CBC
HCT: 27.1 % — ABNORMAL LOW (ref 39.0–52.0)
Hemoglobin: 8.1 g/dL — ABNORMAL LOW (ref 13.0–17.0)
MCH: 28.5 pg (ref 26.0–34.0)
MCHC: 29.9 g/dL — ABNORMAL LOW (ref 30.0–36.0)
MCV: 95.4 fL (ref 80.0–100.0)
Platelets: 115 10*3/uL — ABNORMAL LOW (ref 150–400)
RBC: 2.84 MIL/uL — ABNORMAL LOW (ref 4.22–5.81)
RDW: 15 % (ref 11.5–15.5)
WBC: 8.2 10*3/uL (ref 4.0–10.5)
nRBC: 0 % (ref 0.0–0.2)

## 2018-08-23 LAB — HEMOGLOBIN AND HEMATOCRIT, BLOOD
HCT: 28.6 % — ABNORMAL LOW (ref 39.0–52.0)
HCT: 28.1 % — ABNORMAL LOW (ref 39.0–52.0)
Hemoglobin: 8.5 g/dL — ABNORMAL LOW (ref 13.0–17.0)
Hemoglobin: 8.2 g/dL — ABNORMAL LOW (ref 13.0–17.0)

## 2018-08-23 NOTE — Progress Notes (Signed)
Subjective: No complaints.  Objective: Vital signs in last 24 hours: Temp:  [98 F (36.7 C)-98.2 F (36.8 C)] 98 F (36.7 C) (06/12 0441) Pulse Rate:  [59-64] 64 (06/12 0441) Resp:  [16-17] 16 (06/12 0441) BP: (120-129)/(74-79) 120/74 (06/12 0441) SpO2:  [99 %-100 %] 99 % (06/12 0441) Weight change:  Last BM Date: 08/22/18  PE: GEN:  Awake, NAD NEURO:  Dementia ABD:  Soft, non-tender  Lab Results: CBC    Component Value Date/Time   WBC 8.2 08/23/2018 0357   RBC 2.84 (L) 08/23/2018 0357   HGB 8.1 (L) 08/23/2018 0357   HCT 27.1 (L) 08/23/2018 0357   PLT 115 (L) 08/23/2018 0357   MCV 95.4 08/23/2018 0357   MCH 28.5 08/23/2018 0357   MCHC 29.9 (L) 08/23/2018 0357   RDW 15.0 08/23/2018 0357   LYMPHSABS 1.6 10/25/2017 1637   MONOABS 0.6 10/25/2017 1637   EOSABS 0.4 10/25/2017 1637   BASOSABS 0.0 10/25/2017 1637   CMP     Component Value Date/Time   NA 143 08/23/2018 0357   NA 142 01/24/2018 1153   K 4.6 08/23/2018 0357   CL 110 08/23/2018 0357   CO2 28 08/23/2018 0357   GLUCOSE 115 (H) 08/23/2018 0357   BUN 22 08/23/2018 0357   BUN 18 01/24/2018 1153   CREATININE 1.25 (H) 08/23/2018 0357   CREATININE 0.96 01/15/2014 1648   CALCIUM 8.7 (L) 08/23/2018 0357   PROT 6.1 (L) 08/21/2018 2205   ALBUMIN 3.1 (L) 08/21/2018 2205   AST 19 08/21/2018 2205   ALT 13 08/21/2018 2205   ALKPHOS 50 08/21/2018 2205   BILITOT 0.6 08/21/2018 2205   GFRNONAA 50 (L) 08/23/2018 0357   GFRNONAA 71 01/15/2014 1648   GFRAA 58 (L) 08/23/2018 0357   GFRAA 82 01/15/2014 1648   Assessment:  1.  Hematochezia.  Suspect diverticular.  Less likely upper GI source, but mindful of slight elevation of BUN.  Hemodynamics ok (SBP 140-150s, HR 60s but on BB at home).  No bleeding per nursing last night or today thus far. 2.  Anemia, acute on chronic, with clear blood loss component. 3.  Multiple medical problems (stroke, chronic kidney disease). 4.  Dementia. 5.  History DVT, on rivaroxaban  last dose yesterday late evening (<24 hours ago).  Plan:  1.  Rivaroxaban on hold. 2.  Serial CBCs, transfusion as needed 3.  Advance diet. 4.  In absence of recurrent bleeding, no further GI testing is planned. 5.  When was patient's DVT?  Does he need to be back on anticoagulation down-the-road.  If he does need to be back on anticoagulation, would suggest holding rivaroxaban for 1-2 weeks prior to resuming. 6.  [facility] will follow; if does well with advancement of diet and no further bleeding, might consider discharge tomorrow.   Landry Dyke 08/23/2018, 10:56 AM   Cell [phone] If no answer or after 5 PM call [phone]

## 2018-08-23 NOTE — Progress Notes (Signed)
  Date: 08/23/2018  Patient name: [name]  Medical record number: [id number]  Date of birth: 06/19/1924   I have seen and evaluated this patient and I have discussed the plan of care with the house staff. Please see their note for complete details. I concur with their findings with the following additions/corrections:   85 year old man admitted with hematochezia.  He is on rivaroxaban for DVT, which was held at admission.  He has reportedly continued to have hematochezia since admission, but hemoglobin has remained stable at 8.1 today.  Appreciate GI consultation, they recommend conservative management for now given his age and frailty.  We will continue holding his rivaroxaban and trend his H/H closely.  If it remains stable tomorrow, likely discharge off of rivaroxaban with discussion with PCP regarding whether or not he would resume in the future.  Lenice Pressman, M.D., Ph.D. 08/23/2018, 5:10 PM

## 2018-08-23 NOTE — Discharge Summary (Signed)
Name: [name] MRN: [id number] DOB: May 28, 1924 85 y.o. PCP: [name], DO  Date of Admission: 08/21/2018  9:41 PM Date of Discharge: 08/25/2018 Attending Physician: Dr. Evette Doffing  Discharge Diagnosis: 1. Gastrointestinal bleed likely 2/2 to diverticula while on rivaroxaban for recurrent DVT 2. Hx of DVT  Discharge Medications: Allergies as of 08/25/2018   No Known Allergies     Medication List    STOP taking these medications   irbesartan 300 MG tablet Commonly known as: AVAPRO   rivaroxaban 10 MG Tabs tablet Commonly known as: Xarelto     TAKE these medications   amLODipine 5 MG tablet Commonly known as: NORVASC Take 1 tablet (5 mg total) by mouth daily. Notes to patient: Per home dose   atorvastatin 20 MG tablet Commonly known as: LIPITOR Take 1 tablet (20 mg total) by mouth daily at 6 PM. Notes to patient: tonight   metoprolol succinate 50 MG 24 hr tablet Commonly known as: Toprol XL Take 1 tablet (50 mg total) by mouth daily. Take with or immediately following a meal. Notes to patient: tomorrow       Disposition and follow-up:   [name] was discharged from [facility] in Stable condition.  At the hospital follow up visit please address:  1.  GI bleed and Hx of DVT: - Check cbc to ensure his hemoglobin is stable - Hold Xarelto for at least 2 weeks from discharge and consider longer if indicated - Discuss with family and patient concerning risks and benefits of resuming anticoagulation for his recurrent DVTs and come to a shared decision making on whether to resume Xarelto New supplemental Oxygen requirement: - Repeat Pulse oximetry with ambulation and consider discontinuing if able  2.  Labs / imaging needed at time of follow-up: CBC  3.  Pending labs/ test needing follow-up: N/A  Follow-up Appointments: Follow-up Information    [place]. Go on 08/28/2018.   Why: 10:15 AM to see  Dr.Hoffman Contact information: [address] [place] [phone]         Hospital Course by problem list: 1.  GI bleed: Mr.Caravello is a 85 yo M w/ PMh of history of prior DVT on Xarelto, HTN, dCHF and cerebellar ataxia, dementia, meningioma, and CKD III presenting with bright red blood from his rectum. He was noted to have bloody stool in rectal vault on exam. His hgb was noted to be at 11.2. His Xarelto was held. After admission, it began to trend down with multiple bloody bowel movements. GI was consulted who recommended trending hemoglobin and observation. Hgb dropped down to 8.1 but he was noted to no longer be experiencing bloody bowel movements so no further testing was recommended by GI. His hgb began to trend up and at discharge was noted to be 8.7. He is advised to hold Xarelto for at least two weeks before considering restarting this.   DVT: Initial following TURPS and prolonged bed rest in March 2015 of the LLE. RLE DVT in 09/2013 again associated with prolonged bed rest but with recurrence felt to need prolonged anticoagulation. Holding for two weeks given recent bleed, consider discontinuation if indicated and family in agreement.   New supplemental Oxygen requirement: Likely more chronic than aware. Overall appeared fatigued likely 2/2 to acute blood loss and deconditioning. Discharged on supplemental O2. Please consider discontinuing if appropriate.   Discharge Vitals:   BP 115/71 (BP Location: Left Arm)  Pulse 66   Temp 98.9 F (37.2 C) (Oral)   Resp 18   Ht 6' (1.829 m)   Wt 100.7 kg   SpO2 100%   BMI 30.11 kg/m   Pertinent Labs, Studies, and Procedures:  CBC Latest Ref Rng & Units 08/25/2018 08/24/2018 08/23/2018  WBC 4.0 - 10.5 K/uL 9.1 8.7 -  Hemoglobin 13.0 - 17.0 g/dL 8.7(L) 8.2(L) 8.2(L)  Hematocrit 39.0 - 52.0 % 29.4(L) 28.3(L) 28.1(L)  Platelets 150 - 400 K/uL 108(L) 117(L) -    Discharge Instructions: Discharge Instructions     Call MD for:  difficulty breathing, headache or visual disturbances   Complete by: As directed    Call MD for:  extreme fatigue   Complete by: As directed    Call MD for:  persistant dizziness or light-headedness   Complete by: As directed    Diet - low sodium heart healthy   Complete by: As directed    Discharge instructions   Complete by: As directed    Please stop taking your xarelto and irbesartan until you see your primary care doctor. You should then discuss wether or not to continue these medications.  Please call the clinic with any questions or concerns.   Increase activity slowly   Complete by: As directed       Signed: Kathi Ludwig, MD 08/26/2018, 10:35 AM   Pager: [phone]

## 2018-08-23 NOTE — Progress Notes (Signed)
   Subjective:  [name] is a 85 y.o. M with PMH of prior DVT on Xarelto, HTN,dCHFand cerebellar ataxia, dementia, meningioma, and CKD III admit for BRBPR  on hospital day 2  [name] was examined at bedside this morning and states he is in "no pain." Denies any chest pain, palpitations, dyspnea, light-headedness or abdominal pain. Unable to describe his recent bowel movements.   Objective:  Vital signs in last 24 hours: Vitals:   08/22/18 0947 08/22/18 1507 08/22/18 2131 08/23/18 0441  BP: 140/85 129/74 122/79 120/74  Pulse: 62 (!) 59 61 64  Resp: 16 17 17 16   Temp: 97.9 F (36.6 C) 98 F (36.7 C) 98.2 F (36.8 C) 98 F (36.7 C)  TempSrc: Oral Oral Oral Oral  SpO2: 97% 100% 100% 99%  Weight:      Height:       Physical Exam  Constitutional: He is oriented to person, place, and time and well-developed, well-nourished, and in no distress.  Neck: Normal range of motion. Neck supple.  Cardiovascular: Normal rate, regular rhythm, normal heart sounds and intact distal pulses.  No murmur heard. Pulmonary/Chest: Effort normal and breath sounds normal. He has no wheezes. He has no rales.  Abdominal: Soft. Bowel sounds are normal. He exhibits no distension. There is no abdominal tenderness.  Neurological: He is alert and oriented to person, place, and time.  Stable dysarthria  Skin: Skin is warm and dry.   Assessment/Plan:  Principal Problem:   Lower GI bleed  [name] is a 84 y.o. M with PMH of prior DVT on Xarelto, HTN,dCHFand cerebellar ataxia, dementia, meningioma, and CKD III admit for BRBPR. His hemoglobin continues to trend down but rate of decline appears to have slowed down. No further GI bleed overnight per nursing. Vitals stable w/ bp in 120/70. Appreciate GI recommendations. Will continue to monitor hgb and transfuse as needed.  Anemia 2/2 Lower GI Bleed BRBPR noted on ED exam and per family report. Hgb trending down 11.2->9.9->9.1->8.9->8.5->8.1.  No hx of GI work-up in the past. - Appreciate GI recs: c/w monitor, hold xarelto, recommend d/c tomorrow if stable - Advance diet per GI - Trend H&H - Transfuse if hgb < 7  Hx of recurrent DVT Had IVC filter placed in 2015. On xarelto for history of recurrent DVTs. - Hold home xarelto in setting of bleed - Recommend holding home xarelto at discharge and having f/u with PCP and daughter for shared decision making on whether to resume anticoagulation therapy.  Chronic diastolic heart failure  TTE 10/2017 EF 65-70 w/ grade 1 diastolic dysfunction, mild aortic stenosis. Euvolemic on exam. - Hold home lasix in active bleed. - C/w home meds: metoprolol succinate 50mg  daily, atorvastatin 20mg  daily  HTN Am bp 140/85 - C/w home meds: amlodipine 5mg   DVT prophx: SCDs Diet: Clears Code: Full  Dispo: Anticipated discharge in approximately 1-2 day(s).   Jimmy Anis, MD 08/23/2018, 6:44 AM Pager: [phone]

## 2018-08-23 NOTE — Discharge Instructions (Signed)
Gastrointestinal Bleeding ° °Gastrointestinal bleeding is bleeding somewhere along the path food travels through the body (digestive tract). This path is anywhere between the mouth and the opening of the butt (anus). You may have blood in your poop (stools) or have black poop. If you throw up (vomit), there may be blood in it. °This condition can be mild, serious, or even life-threatening. If you have a lot of bleeding, you may need to stay in the hospital. °Follow these instructions at home: °· Take over-the-counter and prescription medicines only as told by your doctor. °· Eat foods that have a lot of fiber in them. These foods include whole grains, fruits, and vegetables. You can also try eating 1-3 prunes each day. °· Drink enough fluid to keep your pee (urine) clear or pale yellow. °· Keep all follow-up visits as told by your doctor. This is important. °Contact a doctor if: °· Your symptoms do not get better. °Get help right away if: °· Your bleeding gets worse. °· You feel dizzy or you pass out (faint). °· You feel weak. °· You have very bad cramps in your back or belly (abdomen). °· You pass large clumps of blood (clots) in your poop. °· Your symptoms are getting worse. °This information is not intended to replace advice given to you by your health care provider. Make sure you discuss any questions you have with your health care provider. °Document Released: 12/07/2007 Document Revised: 08/05/2015 Document Reviewed: 08/17/2014 °Elsevier Interactive Patient Education © 2019 [employer] ° °

## 2018-08-24 LAB — CBC
HCT: 28.3 % — ABNORMAL LOW (ref 39.0–52.0)
Hemoglobin: 8.2 g/dL — ABNORMAL LOW (ref 13.0–17.0)
MCH: 28.2 pg (ref 26.0–34.0)
MCHC: 29 g/dL — ABNORMAL LOW (ref 30.0–36.0)
MCV: 97.3 fL (ref 80.0–100.0)
Platelets: 117 10*3/uL — ABNORMAL LOW (ref 150–400)
RBC: 2.91 MIL/uL — ABNORMAL LOW (ref 4.22–5.81)
RDW: 15 % (ref 11.5–15.5)
WBC: 8.7 10*3/uL (ref 4.0–10.5)
nRBC: 0 % (ref 0.0–0.2)

## 2018-08-24 MED ORDER — PSYLLIUM 95 % PO PACK
1.0000 | PACK | Freq: Every day | ORAL | Status: DC
  Administered 2018-08-24 – 2018-08-25 (×2): 1 via ORAL
  Filled 2018-08-24 (×2): qty 1

## 2018-08-24 NOTE — Plan of Care (Signed)
  Problem: Clinical Measurements: Goal: Ability to maintain clinical measurements within normal limits will improve Outcome: Progressing   Problem: Coping: Goal: Level of anxiety will decrease Outcome: Progressing   Problem: Pain Managment: Goal: General experience of comfort will improve Outcome: Progressing   Problem: Safety: Goal: Ability to remain free from injury will improve Outcome: Progressing   

## 2018-08-24 NOTE — Progress Notes (Signed)
  Date: 08/24/2018  Patient name: [name]  Medical record number: [id number]  Date of birth: 28-Jan-1925   I have seen and evaluated this patient and I have discussed the plan of care with the house staff. Please see their note for complete details. I concur with their findings with the following additions/corrections:   85 year old man with a history of DVT on rivaroxaban, CKD stage III, and HFpEF admitted with hematochezia.  Hgb dropped from 11-8, but has since stabilized with stopping rivaroxaban.  Appreciate GI consult, continuing conservative management.  On exam today, he is more fatigued and confused, speech is dysarthric at baseline but very difficult to understand today.  We will continue to hold his anticoagulation and work towards discharge for him.  Agree with ongoing conservative management given his advanced age.  Will defer to PCP, Dr. Heber Golconda, the difficult decision of whether or not to resume anticoagulation in the future.  Lenice Pressman, M.D., Ph.D. 08/24/2018, 6:11 PM

## 2018-08-24 NOTE — Progress Notes (Signed)
Daughter made aware of status.

## 2018-08-24 NOTE — Progress Notes (Signed)
   Subjective: Patient says he feels great today.  He denies fever, chills, nausea, vomiting, weakness, dizziness, muscle aches or pains or chest pain.  He is in agreement with the plan to go home today and hold his Xarelto for 2 weeks.  Objective:  Vital signs in last 24 hours: Vitals:   08/23/18 0441 08/23/18 1320 08/23/18 2122 08/24/18 0529  BP: 120/74 118/67 110/63 118/67  Pulse: 64 63 (!) 59 65  Resp: 16 18 18 18   Temp: 98 F (36.7 C) 98.8 F (37.1 C) 98.6 F (37 C) 98.3 F (36.8 C)  TempSrc: Oral Oral Oral Oral  SpO2: 99% 100% 97% 99%  Weight:      Height:       General: A/O x4, in no acute distress, afebrile, nondiaphoretic Cardio: RRR, no mrg's  Pulmonary: CTA bilaterally, no wheezing or crackles  Abdomen: Bowel sounds normal, soft, nontender  Psych: Appropriate affect, not depressed in appearance, engages well  Assessment/Plan:  Principal Problem:   Lower GI bleed  [name] a 85 y.o.Mwith PMH ofprior DVT on Xarelto, HTN,dCHFand cerebellar ataxia, dementia, meningioma, and CKD IIIadmit for BRBPR. His hemoglobin continues to trend down but rate of decline appears to have slowed down. No further GI bleed overnight per nursing. Vitals stable w/ bp in 120/70. Appreciate GI recommendations. Patient appears stable for discharge home today. Patient kept on supplemental oxygen with pulse oximetry readings greater than recommended. He now appears to be dependent to some degree on supplemental oxygen based on nursing report. We will consult PT/OT to evaluate.    Anemia 2/2 Lower GI Bleed BRBPR noted on ED exam and per family report. Hgb trending down 11.2->9.9->9.1->8.9->8.5->8.1->8.2. No hx of GI work-up in the past. Prior hematuria on anticoagulation. - Appreciate GI recs: c/w monitor, hold xarelto - Advance diet per GI, if tolerated, will discharge - Trend H&H stable - Transfuse if hgb < 7 - We will consult PT/OT to evaluate.  Hx of recurrent DVT Had IVC  filter placed in March 2015 for LLE DVT with IVC filter placed. RLE DVT on 09/2013. On xarelto for history of recurrent DVTs. IVC filter in place. - Hold home xarelto in setting of bleed, agree with two week holiday from anticoagulation prior to resumption.  - Recommend holding home xarelto at discharge and having f/u with PCP and daughter for shared decision making on whether to resume anticoagulation therapy.  Chronic diastolic heart failure  TTE 10/2017 EF 65-70 w/grade 1 diastolic dysfunction, mild aortic stenosis. Euvolemic on exam. - Hold home lasix in active bleed. - C/w home meds: metoprolol succinate 50mg  daily, atorvastatin 20mg  daily  HTN Am bp 140/85 - C/w home meds: amlodipine 5mg   DVT prophx:SCDs Diet:Clears Code:Full Dispo: Anticipated discharge in approximately 0-1 day(s).   Kathi Ludwig, MD 08/24/2018, 8:25 AM Pager# [phone]

## 2018-08-24 NOTE — Evaluation (Signed)
Occupational Therapy Evaluation Patient Details Name: [name] MRN: [id number] DOB: September 08, 1924 Today's Date: 08/24/2018    History of Present Illness This 85 y.o. male admitted with anemia due to GI bleed.  PMH includes: h/o DVT, HTN, diastolic heart failure, cerebellar ataxia, h/o CVA, meningioma, CKD stage III    Clinical Impression   Pt admitted with above. He demonstrates the below listed deficits and will benefit from continued OT to maximize safety and independence with BADLs.  Pt seen with PT.  Pt required mod A to move to EOB, mod A +2 to stand, and min A for functional transfers.  He requires min A for UB ADLs and max A for LB ADLs.   Spoke with daughter, and pt required min A for ADLs (and seems to be very dependent on current modifications he has implemented at home.   He requires supervision for ambulation with RW, and is mod I with propelling w/c.  Daughter reports she can provide 24 hour assist at discharge.  She is requesting a new w/c and hospital bed as his current DME is > 55 years old and not functioning as well as it should be.  Recommend HHOT.       Follow Up Recommendations  Home health OT;Supervision/Assistance - 24 hour    Equipment Recommendations  Wheelchair (measurements OT);Wheelchair cushion (measurements OT);Hospital bed    Recommendations for Other Services       Precautions / Restrictions Precautions Precautions: Fall      Mobility Bed Mobility Overal bed mobility: Needs Assistance Bed Mobility: Supine to Sit     Supine to sit: Mod assist     General bed mobility comments: Pt requires assist to move LEs off EOB and to scoot hips forward to EOB   Transfers Overall transfer level: Needs assistance   Transfers: Sit to/from Stand;Stand Pivot Transfers Sit to Stand: Mod assist;+2 physical assistance;+2 safety/equipment Stand pivot transfers: Min assist;+2 safety/equipment       General transfer comment: Pt required mod A +2 to boost  into standing (pt's hips too far back on the bed.  When therapist's attempted to help him scoot forward he stood)    Balance Overall balance assessment: Needs assistance Sitting-balance support: Feet supported Sitting balance-Leahy Scale: Fair Sitting balance - Comments: able to maintain static sitting with min guard assist    Standing balance support: Bilateral upper extremity supported Standing balance-Leahy Scale: Poor Standing balance comment: requires UE support and min A for static standing                            ADL either performed or assessed with clinical judgement   ADL Overall ADL's : Needs assistance/impaired Eating/Feeding: Set up;Sitting   Grooming: Wash/dry hands;Wash/dry face;Oral care;Brushing hair;Minimal assistance;Sitting   Upper Body Bathing: Set up;Supervision/ safety;Sitting   Lower Body Bathing: Moderate assistance;Sit to/from stand   Upper Body Dressing : Minimal assistance;Sitting   Lower Body Dressing: Maximal assistance;Sit to/from stand Lower Body Dressing Details (indicate cue type and reason): Pt unable to access feet EOB.  Daughter reports pt dresses sitting in w/c  Toilet Transfer: Minimal assistance;+2 for safety/equipment;Ambulation;Comfort height toilet;BSC;RW   Toileting- Clothing Manipulation and Hygiene: Moderate assistance;Sit to/from stand       Functional mobility during ADLs: Minimal assistance;Rolling walker;+2 for safety/equipment       Vision Patient Visual Report: No change from baseline       Perception     Praxis  Pertinent Vitals/Pain Pain Assessment: No/denies pain     Hand Dominance Right   Extremity/Trunk Assessment Upper Extremity Assessment Upper Extremity Assessment: RUE deficits/detail;LUE deficits/detail RUE Deficits / Details: ataxia noted  RUE Coordination: decreased fine motor;decreased gross motor LUE Deficits / Details: mild ataxia noted  LUE Coordination: decreased fine  motor;decreased gross motor   Lower Extremity Assessment Lower Extremity Assessment: Defer to PT evaluation       Communication Communication Communication: Expressive difficulties;HOH(dysarthria )   Cognition Arousal/Alertness: Awake/alert Behavior During Therapy: WFL for tasks assessed/performed Overall Cognitive Status: Difficult to assess                                 General Comments: Pt follows gestural commands well    General Comments  BP 98/49 supine, HR 63; sitting 108/68, HR 69.  02 sats 95% on 2L supplemental 02. phone daughter to update her on current status and to get home environment info as well as PLOF     Exercises     Shoulder Instructions      Home Living Family/patient expects to be discharged to:: Private residence Living Arrangements: Children Available Help at Discharge: Family;Available 24 hours/day Type of Home: House Home Access: Ramped entrance     Home Layout: One level     Bathroom Shower/Tub: Tub/shower unit;Curtain   Biochemist, clinical: Standard Bathroom Accessibility: Yes How Accessible: Accessible via walker Home Equipment: [place] - 2 wheels;Bedside commode;Shower seat;Wheelchair - manual;Hospital bed(daughter reports the w/c and hospital bed are > 75 years old )          Prior Functioning/Environment Level of Independence: Needs assistance  Gait / Transfers Assistance Needed: pt ambulates with RW and close supervision.  He is mod I at w/c level  ADL's / Homemaking Assistance Needed: Pt requires assist to pull pants over hips, and for bathing  Communication / Swallowing Assistance Needed: HOH, dysarthric           OT Problem List: Decreased strength;Decreased activity tolerance;Impaired balance (sitting and/or standing);Decreased coordination;Decreased safety awareness;Cardiopulmonary status limiting activity      OT Treatment/Interventions: Self-care/ADL training;Neuromuscular education;DME and/or AE  instruction;Therapeutic activities;Cognitive remediation/compensation;Patient/family education;Balance training    OT Goals(Current goals can be found in the care plan section) Acute Rehab OT Goals Patient Stated Goal: for pt to return home ASAP  OT Goal Formulation: With patient/family Time For Goal Achievement: 09/07/18 Potential to Achieve Goals: Good ADL Goals Pt Will Perform Grooming: (P) with min assist;standing Pt Will Perform Upper Body Bathing: (P) with set-up;sitting Pt Will Perform Lower Body Bathing: (P) with min assist;sit to/from stand Pt Will Perform Upper Body Dressing: (P) with set-up;with supervision;sitting Pt Will Perform Lower Body Dressing: (P) with min assist;sit to/from stand Pt Will Transfer to Toilet: (P) with min guard assist;ambulating;regular height toilet;bedside commode;grab bars  OT Frequency: Min 2X/week   Barriers to D/C:            Co-evaluation              AM-PAC OT "6 Clicks" Daily Activity     Outcome Measure Help from another person eating meals?: None Help from another person taking care of personal grooming?: A Little Help from another person toileting, which includes using toliet, bedpan, or urinal?: A Lot Help from another person bathing (including washing, rinsing, drying)?: A Lot Help from another person to put on and taking off regular upper body clothing?: A Little Help from another  person to put on and taking off regular lower body clothing?: A Lot 6 Click Score: 16   End of Session Equipment Utilized During Treatment: Gait belt;Rolling walker;Oxygen Nurse Communication: Mobility status  Activity Tolerance: Patient tolerated treatment well Patient left: in chair;with call bell/phone within reach;with chair alarm set;with nursing/sitter in room  OT Visit Diagnosis: Unsteadiness on feet (R26.81);Ataxia, unspecified (R27.0)                Time: 8102-5486 OT Time Calculation (min): 24 min Charges:  OT General Charges $OT  Visit: 1 Visit OT Evaluation $OT Eval Moderate Complexity: 1 Mod  Lucille Passy, OTR/L Acute Rehabilitation Services Pager [phone] Office [phone]   Lucille Passy M 08/24/2018, 5:14 PM

## 2018-08-24 NOTE — Progress Notes (Signed)
MD made aware of patient low BP and oxygen saturation at rest .

## 2018-08-24 NOTE — Progress Notes (Signed)
Subjective: No further rectal bleeding since Thursday night as per patient's nurse.  Objective: Vital signs in last 24 hours: Temp:  [98.3 F (36.8 C)-98.8 F (37.1 C)] 98.3 F (36.8 C) (06/13 0529) Pulse Rate:  [59-65] 65 (06/13 0529) Resp:  [18] 18 (06/13 0529) BP: (110-118)/(63-67) 118/67 (06/13 0529) SpO2:  [97 %-100 %] 99 % (06/13 0529) Weight change:  Last BM Date: 08/22/18  PE: Elderly, awake GENERAL: Mild pallor, not in distress ABDOMEN: Soft, nontender EXTREMITIES: no Deformity  Lab Results: Results for orders placed or performed during the hospital encounter of 08/21/18 (from the past 48 hour(s))  Hemoglobin and hematocrit, blood     Status: Abnormal   Collection Time: 08/22/18  6:10 PM  Result Value Ref Range   Hemoglobin 8.9 (L) 13.0 - 17.0 g/dL   HCT 29.9 (L) 39.0 - 52.0 %    Comment: Performed at [facility], [address], [place], [place] [address]  Hemoglobin and hematocrit, blood     Status: Abnormal   Collection Time: 08/22/18 11:26 PM  Result Value Ref Range   Hemoglobin 8.5 (L) 13.0 - 17.0 g/dL   HCT 27.9 (L) 39.0 - 52.0 %    Comment: Performed at [facility] [address], [place], [place] [address]  CBC     Status: Abnormal   Collection Time: 08/23/18  3:57 AM  Result Value Ref Range   WBC 8.2 4.0 - 10.5 K/uL   RBC 2.84 (L) 4.22 - 5.81 MIL/uL   Hemoglobin 8.1 (L) 13.0 - 17.0 g/dL   HCT 27.1 (L) 39.0 - 52.0 %   MCV 95.4 80.0 - 100.0 fL   MCH 28.5 26.0 - 34.0 pg   MCHC 29.9 (L) 30.0 - 36.0 g/dL   RDW 15.0 11.5 - 15.5 %   Platelets 115 (L) 150 - 400 K/uL    Comment: REPEATED TO VERIFY PLATELET COUNT CONFIRMED BY SMEAR Immature Platelet Fraction may be clinically indicated, consider ordering this additional test [id number]    nRBC 0.0 0.0 - 0.2 %    Comment: Performed at [facility], [place] [address], [place], [place] [address]  Basic metabolic panel     Status: Abnormal   Collection Time: 08/23/18  3:57 AM  Result Value  Ref Range   Sodium 143 135 - 145 mmol/L   Potassium 4.6 3.5 - 5.1 mmol/L   Chloride 110 98 - 111 mmol/L   CO2 28 22 - 32 mmol/L   Glucose, Bld 115 (H) 70 - 99 mg/dL   BUN 22 8 - 23 mg/dL   Creatinine, Ser 1.25 (H) 0.61 - 1.24 mg/dL   Calcium 8.7 (L) 8.9 - 10.3 mg/dL   GFR calc non Af Amer 50 (L) >60 mL/min   GFR calc Af Amer 58 (L) >60 mL/min   Anion gap 5 5 - 15    Comment: Performed at [facility] [address], [place], [place] [address]  Hemoglobin and hematocrit, blood     Status: Abnormal   Collection Time: 08/23/18  2:13 PM  Result Value Ref Range   Hemoglobin 8.5 (L) 13.0 - 17.0 g/dL   HCT 28.6 (L) 39.0 - 52.0 %    Comment: Performed at [facility] [address] [place], [place] [address]  Hemoglobin and hematocrit, blood     Status: Abnormal   Collection Time: 08/23/18 10:13 PM  Result Value Ref Range   Hemoglobin 8.2 (L) 13.0 - 17.0  g/dL   HCT 28.1 (L) 39.0 - 52.0 %    Comment: Performed at [facility], [place] [address] [place], [place] [address]  CBC     Status: Abnormal   Collection Time: 08/24/18  2:20 AM  Result Value Ref Range   WBC 8.7 4.0 - 10.5 K/uL   RBC 2.91 (L) 4.22 - 5.81 MIL/uL   Hemoglobin 8.2 (L) 13.0 - 17.0 g/dL   HCT 28.3 (L) 39.0 - 52.0 %   MCV 97.3 80.0 - 100.0 fL   MCH 28.2 26.0 - 34.0 pg   MCHC 29.0 (L) 30.0 - 36.0 g/dL   RDW 15.0 11.5 - 15.5 %   Platelets 117 (L) 150 - 400 K/uL    Comment: REPEATED TO VERIFY SPECIMEN CHECKED FOR CLOTS Immature Platelet Fraction may be clinically indicated, consider ordering this additional test [id number] CONSISTENT WITH PREVIOUS RESULT    nRBC 0.0 0.0 - 0.2 %    Comment: Performed at [facility], [place] [address], [place], [place] [address]    Studies/Results: No results found.  Medications: I have reviewed the patient's current medications.  Assessment: Painless hematochezia, likely diverticular in origin was on rivaroxaban for DVT, held since admission Hemoglobin  stable at 8.5/8.1/8.5/8.2/8.2 Dementia Chronic kidney disease, GFR 58, BUN/creatinine 22/1.25  Plan: Okay to start patient on regular diet, recommend using bulk forming laxative such as psyllium husk/Metamucil on a regular basis. As recommended earlier anticoagulation with rivaroxaban should likely be held for 1 to 2 weeks prior to resuming or if needs to be continued. If no further bleeding in the next 24 hours recommended DC home. Please recall if needed.   Ronnette Juniper 08/24/2018, 12:11 PM   Pager [phone] If no answer or after 5 PM call [phone]

## 2018-08-24 NOTE — Evaluation (Signed)
Physical Therapy Evaluation Patient Details Name: [name] MRN: [id number] DOB: [birth year] Today's Date: 08/24/2018   History of Present Illness  This 85 y.o. male admitted with anemia due to GI bleed.  PMH includes: h/o DVT, HTN, diastolic heart failure, cerebellar ataxia, h/o CVA, meningioma, CKD stage III   Clinical Impression  Pt admitted with above. Prior to admission, pt lives at home with 24/7 assistance from daughter. He is supervision for ambulation using walker and modI for propelling w/c. On PT evaluation, pt requiring modA to stand and taking pivotal steps over to recliner with walker and minA. See below for equipment recommendations, recommending HHPT to maximize functional mobility and decrease caregiver burden.     Follow Up Recommendations Home health PT;Supervision/Assistance - 24 hour    Equipment Recommendations  Wheelchair (measurements PT);Hospital bed    Recommendations for Other Services       Precautions / Restrictions Precautions Precautions: Fall Restrictions Weight Bearing Restrictions: No      Mobility  Bed Mobility Overal bed mobility: Needs Assistance Bed Mobility: Supine to Sit     Supine to sit: Mod assist     General bed mobility comments: Pt requires assist to move LEs off EOB and to scoot hips forward to EOB   Transfers Overall transfer level: Needs assistance   Transfers: Sit to/from Stand;Stand Pivot Transfers Sit to Stand: Mod assist;+2 physical assistance;+2 safety/equipment Stand pivot transfers: Min assist;+2 safety/equipment       General transfer comment: Pt required mod A +2 to boost into standing (pt's hips too far back on the bed.  When therapist's attempted to help him scoot forward he stood)  Ambulation/Gait                Stairs            Wheelchair Mobility    Modified Rankin (Stroke Patients Only)       Balance Overall balance assessment: Needs assistance Sitting-balance support: Feet  supported Sitting balance-Leahy Scale: Fair Sitting balance - Comments: able to maintain static sitting with min guard assist    Standing balance support: Bilateral upper extremity supported Standing balance-Leahy Scale: Poor Standing balance comment: requires UE support and min A for static standing                              Pertinent Vitals/Pain Pain Assessment: No/denies pain    Home Living Family/patient expects to be discharged to:: Private residence Living Arrangements: Children Available Help at Discharge: Family;Available 24 hours/day Type of Home: House Home Access: Ramped entrance     Home Layout: One level Home Equipment: Walker - 2 wheels;Bedside commode;Shower seat;Wheelchair - manual;Hospital bed      Prior Function Level of Independence: Needs assistance   Gait / Transfers Assistance Needed: pt ambulates with RW and close supervision.  He is mod I at w/c level   ADL's / Homemaking Assistance Needed: Pt requires assist to pull pants over hips, and for bathing         Hand Dominance   Dominant Hand: Right    Extremity/Trunk Assessment   Upper Extremity Assessment Upper Extremity Assessment: RUE deficits/detail RUE Deficits / Details: ataxia noted  RUE Coordination: decreased fine motor;decreased gross motor LUE Deficits / Details: mild ataxia noted  LUE Coordination: decreased fine motor;decreased gross motor    Lower Extremity Assessment Lower Extremity Assessment: Generalized weakness       Communication   Communication: Expressive  difficulties;HOH(dysarthric)  Cognition Arousal/Alertness: Awake/alert Behavior During Therapy: WFL for tasks assessed/performed Overall Cognitive Status: Difficult to assess                                 General Comments: Pt follows gestural commands well       General Comments General comments (skin integrity, edema, etc.): BP 98/49 supine, HR 63; sitting 108/68, HR 69.  02 sats  95% on 2L supplemental 02. phone daughter to update her on current status and to get home environment info as well as PLOF     Exercises     Assessment/Plan    PT Assessment Patient needs continued PT services  PT Problem List Decreased strength;Decreased activity tolerance;Decreased balance;Decreased mobility;Decreased coordination;Decreased cognition       PT Treatment Interventions DME instruction;Gait training;Functional mobility training;Therapeutic activities;Therapeutic exercise;Balance training;Patient/family education    PT Goals (Current goals can be found in the Care Plan section)  Acute Rehab PT Goals Patient Stated Goal: for pt to return home ASAP  PT Goal Formulation: With patient Time For Goal Achievement: 09/07/18 Potential to Achieve Goals: Good    Frequency Min 3X/week   Barriers to discharge        Co-evaluation PT/OT/SLP Co-Evaluation/Treatment: Yes Reason for Co-Treatment: Complexity of the patient's impairments (multi-system involvement);Necessary to address cognition/behavior during functional activity;For patient/therapist safety;To address functional/ADL transfers PT goals addressed during session: Mobility/safety with mobility         AM-PAC PT "6 Clicks" Mobility  Outcome Measure Help needed turning from your back to your side while in a flat bed without using bedrails?: A Little Help needed moving from lying on your back to sitting on the side of a flat bed without using bedrails?: A Lot Help needed moving to and from a bed to a chair (including a wheelchair)?: A Little Help needed standing up from a chair using your arms (e.g., wheelchair or bedside chair)?: A Lot Help needed to walk in hospital room?: A Little Help needed climbing 3-5 steps with a railing? : A Lot 6 Click Score: 15    End of Session Equipment Utilized During Treatment: Gait belt Activity Tolerance: Patient tolerated treatment well Patient left: in chair;with call  bell/phone within reach;with chair alarm set Nurse Communication: Mobility status PT Visit Diagnosis: Unsteadiness on feet (R26.81);Muscle weakness (generalized) (M62.81);Difficulty in walking, not elsewhere classified (R26.2)    Time: 8119-1478 PT Time Calculation (min) (ACUTE ONLY): 19 min   Charges:   PT Evaluation $PT Eval Moderate Complexity: 1 Mod          Ellamae Sia, [place], DPT Acute Rehabilitation Services Pager [phone] Office [phone]   Willy Eddy 08/24/2018, 5:20 PM

## 2018-08-25 LAB — CBC
HCT: 29.4 % — ABNORMAL LOW (ref 39.0–52.0)
Hemoglobin: 8.7 g/dL — ABNORMAL LOW (ref 13.0–17.0)
MCH: 28.7 pg (ref 26.0–34.0)
MCHC: 29.6 g/dL — ABNORMAL LOW (ref 30.0–36.0)
MCV: 97 fL (ref 80.0–100.0)
Platelets: 108 10*3/uL — ABNORMAL LOW (ref 150–400)
RBC: 3.03 MIL/uL — ABNORMAL LOW (ref 4.22–5.81)
RDW: 14.6 % (ref 11.5–15.5)
WBC: 9.1 10*3/uL (ref 4.0–10.5)
nRBC: 0 % (ref 0.0–0.2)

## 2018-08-25 NOTE — Plan of Care (Signed)

## 2018-08-25 NOTE — TOC Transition Note (Signed)
Transition of Care [facility]) - CM/SW Discharge Note   Patient Details  Name: [name] MRN: [id number] Date of Birth: Aug 10, 1924  Transition of Care [facility]) CM/SW Contact:  Claudie Leach, RN Phone Number: 08/25/2018, 2:35 PM   Clinical Narrative:    Pt to d/c home with daughter.  Daughter states patient has multiple DME including bed, wheelchair, 3n1 (>83 yrs old), etc.  They would like another 3n1 and need oxygen.  They have used [employer] and [employer] in the past and would like to use either again. Patient requests a portable concentrator- supplied by [employer].    Final next level of care: [place] Barriers to Discharge: No Barriers Identified   Patient Goals and CMS Choice Patient states their goals for this hospitalization and ongoing recovery are:: to return home CMS Medicare.gov Compare Post Acute Care list provided to:: Patient Represenative (must comment)(daughter) Choice offered to / list presented to : Adult Children  Discharge Plan and Services                DME Arranged: 3-N-1, Oxygen DME Agency: [facility], [employer](3n1 from [place], O2 from [place]) Date DME Agency Contacted: 08/25/18 Time DME Agency Contacted: [phone] Representative spoke with at DME Agency: Caryl Pina from [place] from [place]: PT, OT, Nurse's Aide [facility] Agency: [place] ([place]) Date [place]: 08/25/18 Time [place]: [phone] Representative spoke with at [place]: Floydene Flock

## 2018-08-25 NOTE — Progress Notes (Signed)
SATURATION QUALIFICATIONS: (This note is used to comply with regulatory documentation for home oxygen)  Patient Saturations on Room Air at Rest =80%  Patient Saturations on Room Air while Ambulating =n/a%  Patient Saturations on 0 Liters of oxygen while Ambulating = n/a%  Please briefly explain why patient needs home oxygen: Oxygen saturation at rest drops to low 80's.

## 2018-08-25 NOTE — Progress Notes (Signed)
   Subjective: [name] was examined at bedside and states that he wanted to go home. He reports that he is hungry. Nurse reports that he had an episode of desaturation to the 80s yesterday when he was sleeping and another episode this morning when he was awake.   Objective:  Vital signs in last 24 hours: Vitals:   08/25/18 0030 08/25/18 0404 08/25/18 0430 08/25/18 0624  BP:  120/70    Pulse:  64    Resp:      Temp:  98.6 F (37 C)    TempSrc:  Oral    SpO2: (!) 84% 100% (!) 87% (!) 85%  Weight:      Height:       General: Alert and in no acute distress, afebrile, nondiaphoretic Cardio: RRR, no mrg's  Pulmonary: CTA bilaterally, no wheezing or crackles  Abdomen: Bowel sounds normal, soft, nontender  MSK: BLE nontender, nonedematous Psych: Appropriate affect, not depressed in appearance, engages well, dysarthric   Assessment/Plan:  Principal Problem:   Lower GI bleed  [name] a 85 y.o.Mwith PMH ofprior DVT on Xarelto, HTN,dCHFand cerebellar ataxia, dementia, meningioma, and CKD IIIadmit for BRBPR.His hemoglobin continues to trend down but rate of decline appears to have slowed down. No further GI bleed overnight per nursing. Vitals stable w/ bp in 120/70. Appreciate GI recommendations. Patient appears stable for discharge home today. Patient kept on supplemental oxygen with pulse oximetry readings greater than recommended. He now appears to be dependent to some degree on supplemental oxygen based on nursing report. PT/OT recommending home health. Home DME for oxygen ordered. Daughter in agreement with discharge to home today.  Anemia 2/2 Lower GI Bleed BRBPR noted on ED exam and per family report. Hgb trending down 11.2->9.9->9.1->8.9->8.5->8.1->8.2->8.7. No hx of GI work-up in the past. Prior hematuria on anticoagulation. -Appreciate GI recs: c/w monitor, hold xarelto x 2 weeks - Trend of H&H stable - Transfuse if hgb < 7  Hx ofrecurrentDVT Had IVC  filterplaced in March 2015 for LLE DVT with IVC filter placed. RLE DVT on 09/2013. On xarelto for history of recurrent DVTs. IVC filter in place. - Hold home xarelto in setting of bleed, agree with two week holiday from anticoagulation prior to resumption.  - Recommend holding home xarelto at discharge and having f/u with PCP and daughter for shared decision making on whether to resume anticoagulation therapy.  Chronic diastolic heart failure  TTE 10/2017 EF 65-70 w/grade 1 diastolic dysfunction, mild aortic stenosis. Euvolemic on exam. - Hold home lasix in active bleed. - C/w home meds: metoprolol succinate 50mg  daily, atorvastatin 20mg  daily  HTN Am bp 140/85 - C/w home meds: amlodipine 5mg   DVT prophx:SCDs Diet:Clears Code:Full Dispo: Anticipated discharge in approximately 0 day(s).   Jimmy Ludwig, MD 08/25/2018, 7:04 AM Pager: Pager# [phone]

## 2018-08-25 NOTE — Progress Notes (Signed)
Discharged to home. Daughter called and discussed discharge summary and instructions, no questions verbalized.

## 2018-08-26 ENCOUNTER — Telehealth: Payer: Self-pay | Admitting: *Deleted

## 2018-08-26 ENCOUNTER — Telehealth: Payer: Self-pay | Admitting: Internal Medicine

## 2018-08-26 NOTE — Telephone Encounter (Signed)
Pls contact pt daughter [phone], ASAP

## 2018-08-26 NOTE — Telephone Encounter (Signed)
Return call to daughter, [name] - concern about change of pt's baseline since discharged from the hospital yesterday. Stated last night pt had difficulty standing; hard to get pt ready for bed - she knows PT suppose to be seeing pt. Mainly concern about pt's outbursts during the night "hollering out" ?hallucinations; not on any new meds. She wants to know what's going on; stated prior to admission, pt did not have any signs of dementia. Her phone # [phone]

## 2018-08-26 NOTE — Telephone Encounter (Addendum)
Received call from [place] with [employer]. States patient is receiving home oxygen through them post hospital d/c yesterday. He currently has C tanks which are the size of a 2L bottles but lighter. They have educated the daughter on this as well. Currently getting patient qualified for portable concentrator. [name], RN, BSN

## 2018-08-26 NOTE — Telephone Encounter (Signed)
Spoke with daughter, [name], about concerns about agitation last night w/ difficulty orientating himself stating 'I want to go home' despite being at home and yelling at night time and rambling. Daughter states concerns about altered mental status as she has not seen this in the past. She also mentions [name] being tangled up in the oxygen tubes. States episode only occurred last night. Back to baseline mental status in the morning. Denies any evidence of focal weakness or facial droop. Denies any further evidence of rectal bleeding or respiratory distress. Discussed most likely diagnosis of sun-downing due to abrupt changes in setting from hospital to home, exacerbated by additional lines w/ supplemental oxygen. Discussed possibility of taking couple days before he becomes re-acclimated to home setting and making sure to f/u with hospital f/u visit in clinic to assess anemia. [name] expressed understanding.

## 2018-08-27 ENCOUNTER — Telehealth: Payer: Self-pay

## 2018-08-27 DIAGNOSIS — C709 Malignant neoplasm of meninges, unspecified: Secondary | ICD-10-CM | POA: Diagnosis not present

## 2018-08-27 DIAGNOSIS — I69393 Ataxia following cerebral infarction: Secondary | ICD-10-CM | POA: Diagnosis not present

## 2018-08-27 DIAGNOSIS — D631 Anemia in chronic kidney disease: Secondary | ICD-10-CM | POA: Diagnosis not present

## 2018-08-27 DIAGNOSIS — I130 Hypertensive heart and chronic kidney disease with heart failure and stage 1 through stage 4 chronic kidney disease, or unspecified chronic kidney disease: Secondary | ICD-10-CM | POA: Diagnosis not present

## 2018-08-27 DIAGNOSIS — F0390 Unspecified dementia without behavioral disturbance: Secondary | ICD-10-CM | POA: Diagnosis not present

## 2018-08-27 DIAGNOSIS — N183 Chronic kidney disease, stage 3 (moderate): Secondary | ICD-10-CM | POA: Diagnosis not present

## 2018-08-27 DIAGNOSIS — I5030 Unspecified diastolic (congestive) heart failure: Secondary | ICD-10-CM | POA: Diagnosis not present

## 2018-08-27 DIAGNOSIS — D696 Thrombocytopenia, unspecified: Secondary | ICD-10-CM | POA: Diagnosis not present

## 2018-08-27 DIAGNOSIS — K922 Gastrointestinal hemorrhage, unspecified: Secondary | ICD-10-CM | POA: Diagnosis not present

## 2018-08-27 NOTE — Telephone Encounter (Signed)
Rtc, lm for rtc 

## 2018-08-27 NOTE — Telephone Encounter (Signed)
[name] with [facility] requesting to speak with a nurse. Please call back.

## 2018-08-27 NOTE — Telephone Encounter (Signed)
VO for PT 1x week for 8 weeks for safety, transfers, strengthening, balance Do you agree?

## 2018-08-28 ENCOUNTER — Ambulatory Visit (INDEPENDENT_AMBULATORY_CARE_PROVIDER_SITE_OTHER): Payer: Medicare HMO | Admitting: Internal Medicine

## 2018-08-28 ENCOUNTER — Other Ambulatory Visit: Payer: Self-pay

## 2018-08-28 ENCOUNTER — Encounter: Payer: Self-pay | Admitting: Internal Medicine

## 2018-08-28 VITALS — BP 144/80 | HR 64 | Temp 98.3°F | Ht 67.0 in | Wt 218.8 lb

## 2018-08-28 DIAGNOSIS — I10 Essential (primary) hypertension: Secondary | ICD-10-CM

## 2018-08-28 DIAGNOSIS — Z79899 Other long term (current) drug therapy: Secondary | ICD-10-CM

## 2018-08-28 DIAGNOSIS — I110 Hypertensive heart disease with heart failure: Secondary | ICD-10-CM

## 2018-08-28 DIAGNOSIS — Z86718 Personal history of other venous thrombosis and embolism: Secondary | ICD-10-CM

## 2018-08-28 DIAGNOSIS — K922 Gastrointestinal hemorrhage, unspecified: Secondary | ICD-10-CM

## 2018-08-28 DIAGNOSIS — I5032 Chronic diastolic (congestive) heart failure: Secondary | ICD-10-CM

## 2018-08-28 NOTE — Patient Instructions (Addendum)
Thank you for allowing [place] to care for you  For your recent GI Bleed - Recheck blood counts today - Recommend staying off Xarelto due to risk of repeat bleeding  For your blood pressure - Do not take Irbesartan for now  Continue to work with physical therapy and have them check oxygenation with activity

## 2018-08-28 NOTE — Assessment & Plan Note (Signed)
Patient presenting to follow up after admission for GI bleed in the setting of chronic Xarelto use for history of DVT. During his admission Hgb downtrended from 11.2 to 8.1. He was seen by GI who recommended observation and his bleed stopped without intervention. Hgb was up to 8.7 by discharge. His Xarelto was held at discharge and upon discussion with his daughter today in conjunction with his PCP, Xarelto will be discontinued due to risk of bleeding outweighing the benefits.  He was also started on home oxygen at night on discharge. He has had good sats after upper body work with [facility] PT recently and is 93% on room air here. He has not gotten back to walking much. Will allow patient to continue with home oxygen at night and with activity and have PT evaluate pulse ox with his exercises. If saturations remain >92% he will be able to stop oxygen use. - Will recheck CBC today - Discontinue Xarelto - Monitor pulse ox with HHPT

## 2018-08-28 NOTE — Telephone Encounter (Signed)
agree

## 2018-08-28 NOTE — Progress Notes (Signed)
   CC: Hospital follow up for GI bleed, Hypertension  HPI:  [name] is a 85 y.o. M with PMHx listed below presenting for Hospital follow up for GI bleed, Hypertension. Please see the A&P for the status of the patient's chronic medical problems.  Past Medical History:  Diagnosis Date  . Anemia   . BPH (benign prostatic hypertrophy)    TURP 05/19/13  . Cerebral embolism with cerebral infarction ([place]) 12/19/2013  . Chronic kidney disease    CHRONIC KIDNEY DISEASE, 2  . Difficulty hearing, right    BILATERAL HEARING LOSS - BEST TO TRY TO SPEAK INTO LEFT EAR  . DVT (deep venous thrombosis) ([place])   . Frequent falls   . Hyperlipidemia   . Hypertension   . Incontinence of urine    SOME INCONTINENCE  . Left adrenal mass ([place]) 09/11/2013  . Meningioma ([place])   . Stroke ([place])    Cerebellar, 2013; WALKS WITH WALKER, ABLE TO DRESS AND BATHE HIMSELF BUT FAMILY TRIES TO PROVIDE SUPERVISION BECAUSE OF HIS HX OF FALL AND WEAKNESS LEGS, ARMS   . Thrombocytopenia ([place])   . Vitreous hemorrhage ([place]) 12/21/2013   OS 12/18/13 CT /MRI brain done for ataxia    Review of Systems: Performed and all others negative.  Physical Exam:  Vitals:   08/28/18 1028  BP: (!) 144/80  Pulse: 64  Temp: 98.3 F (36.8 C)  TempSrc: Oral  SpO2: 93%  Weight: 218 lb 12.8 oz (99.2 kg)  Height: 5\' 7"  (1.702 m)   Physical Exam Constitutional:      General: He is not in acute distress.    Appearance: Normal appearance.  Cardiovascular:     Rate and Rhythm: Normal rate and regular rhythm.     Pulses: Normal pulses.     Heart sounds: Normal heart sounds.  Pulmonary:     Effort: Pulmonary effort is normal. No respiratory distress.     Breath sounds: Normal breath sounds.  Abdominal:     General: Bowel sounds are normal. There is no distension.     Palpations: Abdomen is soft.     Tenderness: There is no abdominal tenderness.  Musculoskeletal:        General: No swelling or deformity.     Right lower  leg: Edema present.     Left lower leg: Edema present.     Comments: 1+ bilateral pitting edema to calf  Skin:    General: Skin is warm and dry.  Neurological:     General: No focal deficit present.     Mental Status: Mental status is at baseline.    Assessment & Plan:   See Encounters Tab for problem based charting.  Patient seen with Dr. Angelia Mould

## 2018-08-28 NOTE — Assessment & Plan Note (Addendum)
Patient has history of Hypertension. His Irbesartan was discontinued on discharge after recent admission for GI bleed due to adequate control on amlodipine and metoprolol alone. Will tolerate higher BP goal in this 85 yo patient. BP will be reassessed at next PCP follow up. - Amlodipine 5mg  Daily - Metoprolol 50mg  Daily

## 2018-08-29 DIAGNOSIS — K922 Gastrointestinal hemorrhage, unspecified: Secondary | ICD-10-CM | POA: Diagnosis not present

## 2018-08-29 DIAGNOSIS — I130 Hypertensive heart and chronic kidney disease with heart failure and stage 1 through stage 4 chronic kidney disease, or unspecified chronic kidney disease: Secondary | ICD-10-CM | POA: Diagnosis not present

## 2018-08-29 DIAGNOSIS — I69393 Ataxia following cerebral infarction: Secondary | ICD-10-CM | POA: Diagnosis not present

## 2018-08-29 DIAGNOSIS — F0390 Unspecified dementia without behavioral disturbance: Secondary | ICD-10-CM | POA: Diagnosis not present

## 2018-08-29 DIAGNOSIS — I5030 Unspecified diastolic (congestive) heart failure: Secondary | ICD-10-CM | POA: Diagnosis not present

## 2018-08-29 DIAGNOSIS — D696 Thrombocytopenia, unspecified: Secondary | ICD-10-CM | POA: Diagnosis not present

## 2018-08-29 DIAGNOSIS — D631 Anemia in chronic kidney disease: Secondary | ICD-10-CM | POA: Diagnosis not present

## 2018-08-29 DIAGNOSIS — C709 Malignant neoplasm of meninges, unspecified: Secondary | ICD-10-CM | POA: Diagnosis not present

## 2018-08-29 DIAGNOSIS — N183 Chronic kidney disease, stage 3 (moderate): Secondary | ICD-10-CM | POA: Diagnosis not present

## 2018-08-29 LAB — CBC
Hematocrit: 25.3 % — ABNORMAL LOW (ref 37.5–51.0)
Hemoglobin: 7.9 g/dL — ABNORMAL LOW (ref 13.0–17.7)
MCH: 28.5 pg (ref 26.6–33.0)
MCHC: 31.2 g/dL — ABNORMAL LOW (ref 31.5–35.7)
MCV: 91 fL (ref 79–97)
Platelets: 156 10*3/uL (ref 150–450)
RBC: 2.77 x10E6/uL — ABNORMAL LOW (ref 4.14–5.80)
RDW: 13.2 % (ref 11.6–15.4)
WBC: 6.7 10*3/uL (ref 3.4–10.8)

## 2018-08-29 MED ORDER — FUROSEMIDE 40 MG PO TABS: 40.0000 mg | ORAL_TABLET | Freq: Every day | ORAL | 2 refills | Status: DC | PRN

## 2018-08-29 NOTE — Assessment & Plan Note (Addendum)
Patient's Lasix was discontinued during hospitalization for unclear reason. Discussed with daughter over the phone who states he as no longer been taking this on a regular basis, but doe take it as needed for weight gain or edema.  - Lasix 40mg  PRN for weight gain or swelling

## 2018-09-02 ENCOUNTER — Telehealth: Payer: Self-pay | Admitting: Internal Medicine

## 2018-09-02 DIAGNOSIS — C709 Malignant neoplasm of meninges, unspecified: Secondary | ICD-10-CM | POA: Diagnosis not present

## 2018-09-02 DIAGNOSIS — G3281 Cerebellar ataxia in diseases classified elsewhere: Secondary | ICD-10-CM

## 2018-09-02 DIAGNOSIS — N183 Chronic kidney disease, stage 3 (moderate): Secondary | ICD-10-CM | POA: Diagnosis not present

## 2018-09-02 DIAGNOSIS — K922 Gastrointestinal hemorrhage, unspecified: Secondary | ICD-10-CM | POA: Diagnosis not present

## 2018-09-02 DIAGNOSIS — I69393 Ataxia following cerebral infarction: Secondary | ICD-10-CM | POA: Diagnosis not present

## 2018-09-02 DIAGNOSIS — I130 Hypertensive heart and chronic kidney disease with heart failure and stage 1 through stage 4 chronic kidney disease, or unspecified chronic kidney disease: Secondary | ICD-10-CM | POA: Diagnosis not present

## 2018-09-02 DIAGNOSIS — I5030 Unspecified diastolic (congestive) heart failure: Secondary | ICD-10-CM | POA: Diagnosis not present

## 2018-09-02 DIAGNOSIS — F0390 Unspecified dementia without behavioral disturbance: Secondary | ICD-10-CM | POA: Diagnosis not present

## 2018-09-02 DIAGNOSIS — D631 Anemia in chronic kidney disease: Secondary | ICD-10-CM | POA: Diagnosis not present

## 2018-09-02 DIAGNOSIS — I5032 Chronic diastolic (congestive) heart failure: Secondary | ICD-10-CM

## 2018-09-02 DIAGNOSIS — D696 Thrombocytopenia, unspecified: Secondary | ICD-10-CM | POA: Diagnosis not present

## 2018-09-02 NOTE — Telephone Encounter (Signed)
[name] needs VO per [facility] [phone]

## 2018-09-02 NOTE — Telephone Encounter (Signed)
Pt's daughter calling back to f/u with orders on W/C and and [place].  Pt's daughter has spoken to the [facility] and the order are not there.  Please call back and speak with [name] (daughter).

## 2018-09-02 NOTE — Progress Notes (Signed)
Internal Medicine Clinic Attending  I saw and evaluated the patient.  I personally confirmed the key portions of the history and exam documented by Dr. Melvin and I reviewed pertinent patient test results.  The assessment, diagnosis, and plan were formulated together and I agree with the documentation in the resident's note.  

## 2018-09-02 NOTE — Telephone Encounter (Signed)
Received call from [place] with [place] regarding CMN for home oxygen. States CMN needs to be completed and signed by discharge provider as soon as possible. Placed in Dr. Nelma Rothman box. [name], RN, BSN

## 2018-09-02 NOTE — Telephone Encounter (Signed)
Returned call to [name], OT with [facility]. Verbal auth given for [facility] OT 1 week 1, 2 week 2, and 1 week 1. Will route to PCP for agreement/denial. Also, discussed with [name] orders that have been placed for new hospital bed mattress and w/c repairs placed on 08/14/2018. [name] will discuss this with daughter and let us know if these items are still needed. States manual w/c is old and without good brakes. May need new one. [name], RN, BSN

## 2018-09-02 NOTE — Telephone Encounter (Signed)
Agree, thank you

## 2018-09-02 NOTE — Telephone Encounter (Signed)
Returned call to patient's daughter. States it's been > 5 years since patient received manual w/c and mattress for hospital bed. Requesting new manual w/c and hospital tray/overbed table. Will route requests to PCP. [name], RN, BSN

## 2018-09-02 NOTE — Telephone Encounter (Signed)
Contacted Patient's daughter regarding lab work. Informed her that CBC showed stable Hgb of 7.9. While this is lower than the 8.7 he had when discharged, it is similar to the 8.2 he had the day before. She was instructed to bring him in for a recheck if he has recurrent bleeding or other concerning symptoms such as significant fatigue.  She states BMs are returning to normal now and he is working well with PT (Oxygen levels are doing well on RA).

## 2018-09-04 DIAGNOSIS — D631 Anemia in chronic kidney disease: Secondary | ICD-10-CM | POA: Diagnosis not present

## 2018-09-04 DIAGNOSIS — F0390 Unspecified dementia without behavioral disturbance: Secondary | ICD-10-CM | POA: Diagnosis not present

## 2018-09-04 DIAGNOSIS — C709 Malignant neoplasm of meninges, unspecified: Secondary | ICD-10-CM | POA: Diagnosis not present

## 2018-09-04 DIAGNOSIS — K922 Gastrointestinal hemorrhage, unspecified: Secondary | ICD-10-CM | POA: Diagnosis not present

## 2018-09-04 DIAGNOSIS — I130 Hypertensive heart and chronic kidney disease with heart failure and stage 1 through stage 4 chronic kidney disease, or unspecified chronic kidney disease: Secondary | ICD-10-CM | POA: Diagnosis not present

## 2018-09-04 DIAGNOSIS — I69393 Ataxia following cerebral infarction: Secondary | ICD-10-CM | POA: Diagnosis not present

## 2018-09-04 DIAGNOSIS — I5030 Unspecified diastolic (congestive) heart failure: Secondary | ICD-10-CM | POA: Diagnosis not present

## 2018-09-04 DIAGNOSIS — D696 Thrombocytopenia, unspecified: Secondary | ICD-10-CM | POA: Diagnosis not present

## 2018-09-04 DIAGNOSIS — N183 Chronic kidney disease, stage 3 (moderate): Secondary | ICD-10-CM | POA: Diagnosis not present

## 2018-09-04 NOTE — Telephone Encounter (Addendum)
Following community message received from Jolee Ewing at [place]:  I only see the order for the semi bed and LALM. The over the bed table is private pay and is approximately $150.   In order for him to qualify for the bed we need the notes to be addended with the bed narrative information below.  "Due to (insert DX here) patient requires frequent changes in body position AND/OR has an immediate need for a change in body position"  AND ONE (OR MORE) OF THE FOLLOWING:  . "The patient has a medical condition which requires positioning of the body in ways not feasible with an ordinary bed", or . "The patient requires positioning of the body in ways not feasible with an ordinary bed in order to alleviate pain", or . "The patient requires the head of the bed to be elevated more than 30 degrees most of the time due to congestive heart failure, chronic pulmonary disease, or problems with aspiration"   For the Low Air Loss Mattress the notes must indicate the below   Patient has multiple stage II pressure ulcers located on the trunk or pelvis, which have failed to improve in the past month, while also being on a comprehensive ulcer treatment  program including each of the following:  a. Use of an appropriate group 1 support surface, and  b. Regular assessment by a nurse, physician, or other licensed healthcare practitioner, and  c. Appropriate turning and positioning, and  d. Appropriate wound care, and  e. Appropriate management of moisture/incontinence, and  1- Nutritional assessment and intervention consistent with the overall plan of care   Criteria 2) Patient has large or multiple stage III or stage IV pressure ulcers on the trunk or  pelvis   Please note wound documentation must include date of onset, measurements (length, width, depth), stage, location, treatment, and treatment plan.

## 2018-09-04 NOTE — Telephone Encounter (Signed)
[name] called back states now the hospital bed has stopped working. Requesting new hospital bed along with new mattress and new manual w/c. Order for new w/c has been faxed to [facility] and Andria Rhein at [facility]. [name], RN, BSN

## 2018-09-04 NOTE — Telephone Encounter (Signed)
Community message sent to Jolee Ewing with [facility] for DME orders: Hospital bed and mattress and hospital tray/over the bed table. [name], RN, BSN

## 2018-09-04 NOTE — Addendum Note (Signed)
Addended by: [name] on: 09/04/2018 03:18 PM   Modules accepted: Orders

## 2018-09-04 NOTE — Telephone Encounter (Signed)
Order placed for hospital bed

## 2018-09-05 ENCOUNTER — Other Ambulatory Visit: Payer: Self-pay

## 2018-09-05 ENCOUNTER — Ambulatory Visit (INDEPENDENT_AMBULATORY_CARE_PROVIDER_SITE_OTHER): Payer: Medicare HMO | Admitting: Internal Medicine

## 2018-09-05 DIAGNOSIS — D329 Benign neoplasm of meninges, unspecified: Secondary | ICD-10-CM

## 2018-09-05 DIAGNOSIS — I5032 Chronic diastolic (congestive) heart failure: Secondary | ICD-10-CM

## 2018-09-05 DIAGNOSIS — F0390 Unspecified dementia without behavioral disturbance: Secondary | ICD-10-CM | POA: Diagnosis not present

## 2018-09-05 DIAGNOSIS — I69393 Ataxia following cerebral infarction: Secondary | ICD-10-CM

## 2018-09-05 DIAGNOSIS — G3281 Cerebellar ataxia in diseases classified elsewhere: Secondary | ICD-10-CM | POA: Diagnosis not present

## 2018-09-05 DIAGNOSIS — R531 Weakness: Secondary | ICD-10-CM | POA: Diagnosis not present

## 2018-09-05 DIAGNOSIS — I69993 Ataxia following unspecified cerebrovascular disease: Secondary | ICD-10-CM | POA: Diagnosis not present

## 2018-09-05 NOTE — Progress Notes (Addendum)
Subjective:  HPI: [name] is a 85 y.o. male who presents for CHF, hospital bed.  I connected with  [name] on 09/10/18 by telephone.  He quickly passed the phone over to his daughter [name] who is his primary care giver.  This visit was conducted via telehealth due to the coronavirus pandemic.   I discussed the limitations of evaluation and management by telemedicine. The patient expressed understanding and agreed to proceed.  Please see Assessment and Plan below for the status of his chronic medical problems.  Review of Systems: Review of Systems  Constitutional: Negative for chills and fever.  Respiratory: Negative for cough and shortness of breath.   Cardiovascular: Positive for orthopnea. Negative for leg swelling.  Genitourinary: Negative for dysuria.  Musculoskeletal: Negative for myalgias.  Psychiatric/Behavioral: Negative for depression.    Objective:  Physical Exam: There were no vitals filed for this visit. There is no height or weight on file to calculate BMI. Physical Exam Constitutional:      General: He is not in acute distress.   Assessment & Plan:  Chronic diastolic (congestive) heart failure (HCC) HPI: [name] reports [name] has been doing well lately, however a few nights ago the lift mechanizm broke on his hospital bed. He has been unable to lift the bed, she had to try to place extra pillows under him to get him comfortable.  No increased lower extremity edema. Otherwise is doing well  A: Chronic diastolic CHF  P: Order new DME hospital bed  Cerebellar ataxia in diseases classified elsewhere [facility]) HPI: Still having balance issues, manual wheelchair is broken. Needs new one.    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  Will add shower chair order.  Due to Congestive heart failure and incontinence of urine patient requires frequent changes in body position   [name] requires the head of the bed to be elevated more than 30 degrees most of the time due to congestive heart failure.  Medications Ordered No orders of the defined types were placed in this encounter.  Other Orders Orders Placed This Encounter  Procedures  . For home use only DME Hospital bed    Order Specific Question:   Length of Need    Answer:   Lifetime    Order Specific Question:   The above medical condition requires:    Answer:   Patient requires the ability to reposition frequently    Order Specific Question:   Head must be elevated greater than:    Answer:   30 degrees    Order Specific Question:   Bed type    Answer:   Semi-electric  . For home use only DME Shower stool    Length of need 99 months, balance impairment due to cerebellar ataxia.  . For home use only DME standard manual wheelchair with seat cushion    Patient suffers from cerebellar ataxia which impairs their ability to perform daily activities like bathing, dressing, feeding and grooming in the home.  A cane will not resolve issue with performing activities of daily living. A wheelchair will allow patient to safely perform daily activities. Patient can safely propel the wheelchair in the home or has a caregiver who can provide assistance. Length of need Lifetime. Accessories: elevating  leg rests (ELRs), wheel locks, extensions and anti-tippers.      Durable Medical Equipment  (From admission, onward)         Start     Ordered   09/10/18 0000  For home use only DME standard manual wheelchair with seat cushion    Comments: Patient suffers from cerebellar ataxia which impairs their ability to perform daily activities like bathing, dressing, feeding and grooming in the home.  A cane will not resolve issue with performing activities of daily living. A  wheelchair will allow patient to safely perform daily activities. Patient can safely propel the wheelchair in the home or has a caregiver who can provide assistance. Length of need Lifetime. Accessories: elevating leg rests (ELRs), wheel locks, extensions and anti-tippers.   09/10/18 1230   09/06/18 0000  For home use only DME Shower stool    Comments: Length of need 99 months, balance impairment due to cerebellar ataxia.   09/06/18 1149   09/05/18 0000  For home use only DME Hospital bed    Question Answer Comment  Length of Need Lifetime   The above medical condition requires: Patient requires the ability to reposition frequently   Head must be elevated greater than: 30 degrees   Bed type Semi-electric      09/05/18 1054          Follow Up: Return in about 3 months (around 12/06/2018).

## 2018-09-05 NOTE — Addendum Note (Signed)
Addended by: Joni Reining C on: 09/05/2018 11:11 AM   Modules accepted: Orders

## 2018-09-05 NOTE — Assessment & Plan Note (Signed)
HPI: [name] reports [name] has been doing well lately, however a few nights ago the lift mechanizm broke on his hospital bed. He has been unable to lift the bed, she had to try to place extra pillows under him to get him comfortable.  No increased lower extremity edema. Otherwise is doing well  A: Chronic diastolic CHF  P: Order new DME hospital bed

## 2018-09-05 NOTE — Assessment & Plan Note (Signed)
HPI: Still having balance issues, manual wheelchair is broken. Needs new one.    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  Will add shower chair order.

## 2018-09-06 DIAGNOSIS — N183 Chronic kidney disease, stage 3 (moderate): Secondary | ICD-10-CM | POA: Diagnosis not present

## 2018-09-06 DIAGNOSIS — I130 Hypertensive heart and chronic kidney disease with heart failure and stage 1 through stage 4 chronic kidney disease, or unspecified chronic kidney disease: Secondary | ICD-10-CM | POA: Diagnosis not present

## 2018-09-06 DIAGNOSIS — I69393 Ataxia following cerebral infarction: Secondary | ICD-10-CM | POA: Diagnosis not present

## 2018-09-06 DIAGNOSIS — K922 Gastrointestinal hemorrhage, unspecified: Secondary | ICD-10-CM | POA: Diagnosis not present

## 2018-09-06 DIAGNOSIS — D631 Anemia in chronic kidney disease: Secondary | ICD-10-CM | POA: Diagnosis not present

## 2018-09-06 DIAGNOSIS — F0390 Unspecified dementia without behavioral disturbance: Secondary | ICD-10-CM | POA: Diagnosis not present

## 2018-09-06 DIAGNOSIS — I5030 Unspecified diastolic (congestive) heart failure: Secondary | ICD-10-CM | POA: Diagnosis not present

## 2018-09-06 DIAGNOSIS — C709 Malignant neoplasm of meninges, unspecified: Secondary | ICD-10-CM | POA: Diagnosis not present

## 2018-09-06 DIAGNOSIS — D696 Thrombocytopenia, unspecified: Secondary | ICD-10-CM | POA: Diagnosis not present

## 2018-09-06 NOTE — Addendum Note (Signed)
Addended by: Joni Reining C on: 09/06/2018 11:47 AM   Modules accepted: Orders

## 2018-09-06 NOTE — Addendum Note (Signed)
Addended by: Joni Reining C on: 09/06/2018 11:50 AM   Modules accepted: Orders

## 2018-09-06 NOTE — Telephone Encounter (Signed)
Following message received from Jolee Ewing at [place]:  Wheel chair order needs the length of need on it, and we need the narrative note showing on the order to be moved to the office notes. We will also need a new order for a tub seat instead of the tub transfer bench. We are being advised by the daughter that he needs a tub seat not a tub bench. [name], RN, BSN

## 2018-09-10 ENCOUNTER — Telehealth: Payer: Self-pay | Admitting: *Deleted

## 2018-09-10 DIAGNOSIS — F0390 Unspecified dementia without behavioral disturbance: Secondary | ICD-10-CM | POA: Diagnosis not present

## 2018-09-10 DIAGNOSIS — I130 Hypertensive heart and chronic kidney disease with heart failure and stage 1 through stage 4 chronic kidney disease, or unspecified chronic kidney disease: Secondary | ICD-10-CM | POA: Diagnosis not present

## 2018-09-10 DIAGNOSIS — D631 Anemia in chronic kidney disease: Secondary | ICD-10-CM | POA: Diagnosis not present

## 2018-09-10 DIAGNOSIS — K922 Gastrointestinal hemorrhage, unspecified: Secondary | ICD-10-CM | POA: Diagnosis not present

## 2018-09-10 DIAGNOSIS — I69393 Ataxia following cerebral infarction: Secondary | ICD-10-CM | POA: Diagnosis not present

## 2018-09-10 DIAGNOSIS — C709 Malignant neoplasm of meninges, unspecified: Secondary | ICD-10-CM | POA: Diagnosis not present

## 2018-09-10 DIAGNOSIS — D696 Thrombocytopenia, unspecified: Secondary | ICD-10-CM | POA: Diagnosis not present

## 2018-09-10 DIAGNOSIS — I5032 Chronic diastolic (congestive) heart failure: Secondary | ICD-10-CM

## 2018-09-10 DIAGNOSIS — I5030 Unspecified diastolic (congestive) heart failure: Secondary | ICD-10-CM | POA: Diagnosis not present

## 2018-09-10 DIAGNOSIS — N183 Chronic kidney disease, stage 3 (moderate): Secondary | ICD-10-CM | POA: Diagnosis not present

## 2018-09-10 NOTE — Addendum Note (Signed)
Addended by: Joni Reining C on: 09/10/2018 12:30 PM   Modules accepted: Orders

## 2018-09-10 NOTE — Telephone Encounter (Signed)
I have placed order

## 2018-09-10 NOTE — Telephone Encounter (Signed)
Received call from [place] with [employer]. States per daughter patient is not using home oxygen. Caryl Pina recommends overnight pulse oximetry to see if patient may only need oxygen at night. Will route to PCP for consideration. [name], RN, BSN

## 2018-09-11 ENCOUNTER — Telehealth: Payer: Self-pay

## 2018-09-11 DIAGNOSIS — I10 Essential (primary) hypertension: Secondary | ICD-10-CM | POA: Diagnosis not present

## 2018-09-11 DIAGNOSIS — I639 Cerebral infarction, unspecified: Secondary | ICD-10-CM | POA: Diagnosis not present

## 2018-09-11 DIAGNOSIS — D631 Anemia in chronic kidney disease: Secondary | ICD-10-CM | POA: Diagnosis not present

## 2018-09-11 DIAGNOSIS — D696 Thrombocytopenia, unspecified: Secondary | ICD-10-CM | POA: Diagnosis not present

## 2018-09-11 DIAGNOSIS — F0390 Unspecified dementia without behavioral disturbance: Secondary | ICD-10-CM | POA: Diagnosis not present

## 2018-09-11 DIAGNOSIS — I5032 Chronic diastolic (congestive) heart failure: Secondary | ICD-10-CM

## 2018-09-11 DIAGNOSIS — I130 Hypertensive heart and chronic kidney disease with heart failure and stage 1 through stage 4 chronic kidney disease, or unspecified chronic kidney disease: Secondary | ICD-10-CM | POA: Diagnosis not present

## 2018-09-11 DIAGNOSIS — I5030 Unspecified diastolic (congestive) heart failure: Secondary | ICD-10-CM | POA: Diagnosis not present

## 2018-09-11 DIAGNOSIS — N183 Chronic kidney disease, stage 3 (moderate): Secondary | ICD-10-CM | POA: Diagnosis not present

## 2018-09-11 DIAGNOSIS — C709 Malignant neoplasm of meninges, unspecified: Secondary | ICD-10-CM | POA: Diagnosis not present

## 2018-09-11 DIAGNOSIS — K922 Gastrointestinal hemorrhage, unspecified: Secondary | ICD-10-CM | POA: Diagnosis not present

## 2018-09-11 DIAGNOSIS — I69393 Ataxia following cerebral infarction: Secondary | ICD-10-CM | POA: Diagnosis not present

## 2018-09-11 NOTE — Telephone Encounter (Signed)
Returned call to daughter. States patient is back to his baseline and is refusing to wear oxygen even at night. Does not want to do overnight oximetry, just wants oxygen removed from home as she doesn't want to pay for something that patient won't use. Left message for Caryl Pina with [employer] and spoke with Iona Beard at [facility] with this information. [name], RN, BSN

## 2018-09-11 NOTE — Telephone Encounter (Signed)
Returned call to daughter. States patient is back to his baseline and is refusing to wear oxygen even at night. Does not want to do overnight oximetry, just wants oxygen removed from home as she doesn't want to pay for something that patient won't use. Left message for Caryl Pina with [employer] and spoke with Iona Beard at [facility] with this information. [name], RN, BSN

## 2018-09-11 NOTE — Telephone Encounter (Signed)
Pt's daughter states her father do not need oxygen, oxygen level is great per home health nurse. Requesting the doctor to write a note so [facility] can pick up the oxygen. Please call back.

## 2018-09-12 DIAGNOSIS — N183 Chronic kidney disease, stage 3 (moderate): Secondary | ICD-10-CM | POA: Diagnosis not present

## 2018-09-12 DIAGNOSIS — D696 Thrombocytopenia, unspecified: Secondary | ICD-10-CM | POA: Diagnosis not present

## 2018-09-12 DIAGNOSIS — I130 Hypertensive heart and chronic kidney disease with heart failure and stage 1 through stage 4 chronic kidney disease, or unspecified chronic kidney disease: Secondary | ICD-10-CM | POA: Diagnosis not present

## 2018-09-12 DIAGNOSIS — C709 Malignant neoplasm of meninges, unspecified: Secondary | ICD-10-CM | POA: Diagnosis not present

## 2018-09-12 DIAGNOSIS — I69393 Ataxia following cerebral infarction: Secondary | ICD-10-CM | POA: Diagnosis not present

## 2018-09-12 DIAGNOSIS — G3281 Cerebellar ataxia in diseases classified elsewhere: Secondary | ICD-10-CM | POA: Diagnosis not present

## 2018-09-12 DIAGNOSIS — K922 Gastrointestinal hemorrhage, unspecified: Secondary | ICD-10-CM | POA: Diagnosis not present

## 2018-09-12 DIAGNOSIS — I5030 Unspecified diastolic (congestive) heart failure: Secondary | ICD-10-CM | POA: Diagnosis not present

## 2018-09-12 DIAGNOSIS — F0390 Unspecified dementia without behavioral disturbance: Secondary | ICD-10-CM | POA: Diagnosis not present

## 2018-09-12 DIAGNOSIS — I5032 Chronic diastolic (congestive) heart failure: Secondary | ICD-10-CM | POA: Diagnosis not present

## 2018-09-12 DIAGNOSIS — D631 Anemia in chronic kidney disease: Secondary | ICD-10-CM | POA: Diagnosis not present

## 2018-09-18 DIAGNOSIS — C709 Malignant neoplasm of meninges, unspecified: Secondary | ICD-10-CM | POA: Diagnosis not present

## 2018-09-18 DIAGNOSIS — N183 Chronic kidney disease, stage 3 (moderate): Secondary | ICD-10-CM | POA: Diagnosis not present

## 2018-09-18 DIAGNOSIS — I69393 Ataxia following cerebral infarction: Secondary | ICD-10-CM | POA: Diagnosis not present

## 2018-09-18 DIAGNOSIS — D631 Anemia in chronic kidney disease: Secondary | ICD-10-CM | POA: Diagnosis not present

## 2018-09-18 DIAGNOSIS — I5030 Unspecified diastolic (congestive) heart failure: Secondary | ICD-10-CM | POA: Diagnosis not present

## 2018-09-18 DIAGNOSIS — K922 Gastrointestinal hemorrhage, unspecified: Secondary | ICD-10-CM | POA: Diagnosis not present

## 2018-09-18 DIAGNOSIS — D696 Thrombocytopenia, unspecified: Secondary | ICD-10-CM | POA: Diagnosis not present

## 2018-09-18 DIAGNOSIS — F0390 Unspecified dementia without behavioral disturbance: Secondary | ICD-10-CM | POA: Diagnosis not present

## 2018-09-18 DIAGNOSIS — I130 Hypertensive heart and chronic kidney disease with heart failure and stage 1 through stage 4 chronic kidney disease, or unspecified chronic kidney disease: Secondary | ICD-10-CM | POA: Diagnosis not present

## 2018-09-19 DIAGNOSIS — C709 Malignant neoplasm of meninges, unspecified: Secondary | ICD-10-CM | POA: Diagnosis not present

## 2018-09-19 DIAGNOSIS — D631 Anemia in chronic kidney disease: Secondary | ICD-10-CM | POA: Diagnosis not present

## 2018-09-19 DIAGNOSIS — N183 Chronic kidney disease, stage 3 (moderate): Secondary | ICD-10-CM | POA: Diagnosis not present

## 2018-09-19 DIAGNOSIS — D696 Thrombocytopenia, unspecified: Secondary | ICD-10-CM | POA: Diagnosis not present

## 2018-09-19 DIAGNOSIS — F0390 Unspecified dementia without behavioral disturbance: Secondary | ICD-10-CM | POA: Diagnosis not present

## 2018-09-19 DIAGNOSIS — I69393 Ataxia following cerebral infarction: Secondary | ICD-10-CM | POA: Diagnosis not present

## 2018-09-19 DIAGNOSIS — I5030 Unspecified diastolic (congestive) heart failure: Secondary | ICD-10-CM | POA: Diagnosis not present

## 2018-09-19 DIAGNOSIS — K922 Gastrointestinal hemorrhage, unspecified: Secondary | ICD-10-CM | POA: Diagnosis not present

## 2018-09-19 DIAGNOSIS — I130 Hypertensive heart and chronic kidney disease with heart failure and stage 1 through stage 4 chronic kidney disease, or unspecified chronic kidney disease: Secondary | ICD-10-CM | POA: Diagnosis not present

## 2018-09-20 NOTE — Addendum Note (Signed)
Addended by: Hulan Fray on: 09/20/2018 02:59 PM   Modules accepted: Orders

## 2018-09-24 ENCOUNTER — Telehealth: Payer: Self-pay | Admitting: Internal Medicine

## 2018-09-24 DIAGNOSIS — C709 Malignant neoplasm of meninges, unspecified: Secondary | ICD-10-CM | POA: Diagnosis not present

## 2018-09-24 DIAGNOSIS — D631 Anemia in chronic kidney disease: Secondary | ICD-10-CM | POA: Diagnosis not present

## 2018-09-24 DIAGNOSIS — F0390 Unspecified dementia without behavioral disturbance: Secondary | ICD-10-CM | POA: Diagnosis not present

## 2018-09-24 DIAGNOSIS — I69393 Ataxia following cerebral infarction: Secondary | ICD-10-CM | POA: Diagnosis not present

## 2018-09-24 DIAGNOSIS — N183 Chronic kidney disease, stage 3 (moderate): Secondary | ICD-10-CM | POA: Diagnosis not present

## 2018-09-24 DIAGNOSIS — I130 Hypertensive heart and chronic kidney disease with heart failure and stage 1 through stage 4 chronic kidney disease, or unspecified chronic kidney disease: Secondary | ICD-10-CM | POA: Diagnosis not present

## 2018-09-24 DIAGNOSIS — K922 Gastrointestinal hemorrhage, unspecified: Secondary | ICD-10-CM | POA: Diagnosis not present

## 2018-09-24 DIAGNOSIS — I5030 Unspecified diastolic (congestive) heart failure: Secondary | ICD-10-CM | POA: Diagnosis not present

## 2018-09-24 DIAGNOSIS — D696 Thrombocytopenia, unspecified: Secondary | ICD-10-CM | POA: Diagnosis not present

## 2018-09-24 NOTE — Telephone Encounter (Addendum)
Agree with social worker eval

## 2018-09-24 NOTE — Telephone Encounter (Signed)
[name] needs VO for [facility] [phone]

## 2018-09-24 NOTE — Telephone Encounter (Signed)
Returned call to [place]. Verbal auth given for [facility] SW eval to assist patient with community resources and medicaid eligibility. Will route to PCP for agreement/denial. [name], RN, BSN

## 2018-09-26 DIAGNOSIS — D631 Anemia in chronic kidney disease: Secondary | ICD-10-CM | POA: Diagnosis not present

## 2018-09-26 DIAGNOSIS — N183 Chronic kidney disease, stage 3 (moderate): Secondary | ICD-10-CM | POA: Diagnosis not present

## 2018-09-26 DIAGNOSIS — C709 Malignant neoplasm of meninges, unspecified: Secondary | ICD-10-CM | POA: Diagnosis not present

## 2018-09-26 DIAGNOSIS — I130 Hypertensive heart and chronic kidney disease with heart failure and stage 1 through stage 4 chronic kidney disease, or unspecified chronic kidney disease: Secondary | ICD-10-CM | POA: Diagnosis not present

## 2018-09-26 DIAGNOSIS — F0390 Unspecified dementia without behavioral disturbance: Secondary | ICD-10-CM | POA: Diagnosis not present

## 2018-09-26 DIAGNOSIS — D696 Thrombocytopenia, unspecified: Secondary | ICD-10-CM | POA: Diagnosis not present

## 2018-09-26 DIAGNOSIS — K922 Gastrointestinal hemorrhage, unspecified: Secondary | ICD-10-CM | POA: Diagnosis not present

## 2018-09-26 DIAGNOSIS — I5030 Unspecified diastolic (congestive) heart failure: Secondary | ICD-10-CM | POA: Diagnosis not present

## 2018-09-26 DIAGNOSIS — I69393 Ataxia following cerebral infarction: Secondary | ICD-10-CM | POA: Diagnosis not present

## 2018-09-30 ENCOUNTER — Telehealth: Payer: Self-pay

## 2018-09-30 DIAGNOSIS — I69393 Ataxia following cerebral infarction: Secondary | ICD-10-CM | POA: Diagnosis not present

## 2018-09-30 DIAGNOSIS — D631 Anemia in chronic kidney disease: Secondary | ICD-10-CM | POA: Diagnosis not present

## 2018-09-30 DIAGNOSIS — I130 Hypertensive heart and chronic kidney disease with heart failure and stage 1 through stage 4 chronic kidney disease, or unspecified chronic kidney disease: Secondary | ICD-10-CM | POA: Diagnosis not present

## 2018-09-30 DIAGNOSIS — K922 Gastrointestinal hemorrhage, unspecified: Secondary | ICD-10-CM | POA: Diagnosis not present

## 2018-09-30 DIAGNOSIS — C709 Malignant neoplasm of meninges, unspecified: Secondary | ICD-10-CM | POA: Diagnosis not present

## 2018-09-30 DIAGNOSIS — D696 Thrombocytopenia, unspecified: Secondary | ICD-10-CM | POA: Diagnosis not present

## 2018-09-30 DIAGNOSIS — F0390 Unspecified dementia without behavioral disturbance: Secondary | ICD-10-CM | POA: Diagnosis not present

## 2018-09-30 DIAGNOSIS — I5030 Unspecified diastolic (congestive) heart failure: Secondary | ICD-10-CM | POA: Diagnosis not present

## 2018-09-30 DIAGNOSIS — N183 Chronic kidney disease, stage 3 (moderate): Secondary | ICD-10-CM | POA: Diagnosis not present

## 2018-09-30 NOTE — Telephone Encounter (Signed)
[name] with [facility] requesting VO for OT. Please call back.

## 2018-09-30 NOTE — Telephone Encounter (Signed)
ot VO 1x week for 4 weeks for continued strengthening Do you agree?

## 2018-09-30 NOTE — Telephone Encounter (Signed)
Will leave to Dr. Heber [place].

## 2018-10-01 NOTE — Telephone Encounter (Signed)
Agree with VO for 4 weeks continued strengthening

## 2018-10-02 ENCOUNTER — Telehealth: Payer: Self-pay | Admitting: *Deleted

## 2018-10-02 ENCOUNTER — Other Ambulatory Visit: Payer: Self-pay

## 2018-10-02 ENCOUNTER — Encounter: Payer: Self-pay | Admitting: Internal Medicine

## 2018-10-02 ENCOUNTER — Ambulatory Visit (INDEPENDENT_AMBULATORY_CARE_PROVIDER_SITE_OTHER): Payer: Medicare HMO | Admitting: Internal Medicine

## 2018-10-02 VITALS — BP 143/76 | HR 64 | Temp 98.0°F | Ht 67.0 in

## 2018-10-02 DIAGNOSIS — R32 Unspecified urinary incontinence: Secondary | ICD-10-CM

## 2018-10-02 DIAGNOSIS — Z8669 Personal history of other diseases of the nervous system and sense organs: Secondary | ICD-10-CM

## 2018-10-02 DIAGNOSIS — I5032 Chronic diastolic (congestive) heart failure: Secondary | ICD-10-CM

## 2018-10-02 DIAGNOSIS — I10 Essential (primary) hypertension: Secondary | ICD-10-CM

## 2018-10-02 DIAGNOSIS — I130 Hypertensive heart and chronic kidney disease with heart failure and stage 1 through stage 4 chronic kidney disease, or unspecified chronic kidney disease: Secondary | ICD-10-CM | POA: Diagnosis not present

## 2018-10-02 DIAGNOSIS — N3001 Acute cystitis with hematuria: Secondary | ICD-10-CM

## 2018-10-02 DIAGNOSIS — N183 Chronic kidney disease, stage 3 (moderate): Secondary | ICD-10-CM

## 2018-10-02 DIAGNOSIS — R4182 Altered mental status, unspecified: Secondary | ICD-10-CM

## 2018-10-02 DIAGNOSIS — Z79899 Other long term (current) drug therapy: Secondary | ICD-10-CM | POA: Diagnosis not present

## 2018-10-02 DIAGNOSIS — Z8673 Personal history of transient ischemic attack (TIA), and cerebral infarction without residual deficits: Secondary | ICD-10-CM | POA: Diagnosis not present

## 2018-10-02 MED ORDER — FUROSEMIDE 40 MG PO TABS: 40.0000 mg | ORAL_TABLET | Freq: Every day | ORAL | 2 refills | Status: DC | PRN

## 2018-10-02 MED ORDER — CEPHALEXIN 500 MG PO CAPS: 500.0000 mg | ORAL_CAPSULE | Freq: Two times a day (BID) | ORAL | 0 refills | Status: DC

## 2018-10-02 NOTE — Patient Instructions (Addendum)
I sent lasix to your pharmacy. This should help with the fluid in his legs. We will also try to collect a urine sample to look for infection.

## 2018-10-02 NOTE — Assessment & Plan Note (Addendum)
Daughter notes that the patient has been having an altered mental status lately she feels like this is rather consistent with prior urinary tract infections.  Plan: Keflex twice daily x5 days.  Unable to obtain UA during office visit. If daughter does not feel like symptoms improve in the next week, we can consider straight cath for UA in order to make sure abx have coverage.

## 2018-10-02 NOTE — Telephone Encounter (Signed)
Thank you, definitely worried about difficulty breathing and asymmetric foot swelling. Will need evaluation today.

## 2018-10-02 NOTE — Telephone Encounter (Signed)
Call from pt's daughter, [name] - pt c/o "having trouble breathing". Not wearing oxygen esp at night. Also notice right foot is swollen. He does have some wheezes. She states he usually does not complain. Also she stated he may have UTI; stated he had them before. She's willing to bring pt to the office. Appt schedule today @ 1315 PM.

## 2018-10-02 NOTE — Assessment & Plan Note (Signed)
Patient presents today for shortness of breath that developed this morning.  Daughter reports that he was not complaining of this in the past few weeks.  Patient has fairly remarkable lower extremity edema today.  Is been off Lasix for about a month now. O2 sats were initially 78% on room air when patient came in to the clinic.  He was subsequently placed on 4 L O2 via an nasal cannula.  His O2 sats responded appropriately and he was able to be weaned down to room air with O2 sats maintaining greater than 90%.  Given that the patient did not really tolerate a nasal cannula at home, I encouraged the daughter just to continue to watch and do spot checks of his O2 sats.  This desaturation was likely secondary to this fluid overload and should improve with diuresis.  Plan: Restart Lasix 40 mg.  Given that the patient is incontinent and at higher risk for complications of diuresis and urinary tract infections, encouraged the daughter to use Lasix daily for the first couple days at least and then they can go back to their old schedule of Monday Wednesday Friday or as needed.  We will have him follow-up in a couple weeks here in the office for repeat labs and reevaluation of the peripheral edema.

## 2018-10-02 NOTE — Progress Notes (Signed)
CC: leg swelling, shortness of breath  HPI:  [name] is a 85 y.o. male with past medical history significant for hypertension, chronic diastolic heart failure, meningioma, CVA, age, CKD 3.  Off lasix a little over a month He presents today for leg swelling and shortness of breath.  He is accompanied by his daughter who is primary historian during this visit.  Notes that after his last hospital discharge, he was initially supplemental oxygen at home.  However the patient continued to tear this off and further oxygen saturation test at home revealed normal O2 sats.  Subsequently, this was canceled.  He was also placed on Lasix following his last hospitalization.  Due to his frequent incontinence, the daughter chose to hold this.  He has been taking Lasix for about a month now.  This morning, the patient complained of shortness of breath.  The daughter denies him having any issues with orthopnea.  The daughter also feels like he has been having a more altered mental status as of late.  This is consistent with prior symptoms with urinary tract infections.  The patient is incontinent and requires depends at all times.  Past Medical History:  Diagnosis Date  . Anemia   . BPH (benign prostatic hypertrophy)    TURP 05/19/13  . Cerebral embolism with cerebral infarction ([place]) 12/19/2013  . Chronic kidney disease    CHRONIC KIDNEY DISEASE, 2  . Difficulty hearing, right    BILATERAL HEARING LOSS - BEST TO TRY TO SPEAK INTO LEFT EAR  . DVT (deep venous thrombosis) ([place])   . Frequent falls   . History of DVT (deep vein thrombosis) 06/10/2013   Provoked s/p TURP. Dx per doppler 06/10/13. Complicated by hematuria while on anticoagulation s/p TURP. Initially on lovenox and coumadin, then stopped, then IVC filter placed 06/16/13. Anticoagulation restarted after hematuria stopped. Lovenox was discontinued apparently on 07/29/13 with comment on high risk for falls.   Total ~1.5 months of anticoagulation  but interrupted with bleeding complication.  . Hyperlipidemia   . Hypertension   . Incontinence of urine    SOME INCONTINENCE  . Left adrenal mass ([place]) 09/11/2013  . Meningioma ([place])   . Stroke ([place])    Cerebellar, 2013; WALKS WITH WALKER, ABLE TO DRESS AND BATHE HIMSELF BUT FAMILY TRIES TO PROVIDE SUPERVISION BECAUSE OF HIS HX OF FALL AND WEAKNESS LEGS, ARMS   . Thrombocytopenia ([place])   . Vitreous hemorrhage ([place]) 12/21/2013   OS 12/18/13 CT /MRI brain done for ataxia    Review of Systems: No recent fever, chills, sore throat, shortness of breath prior to today.  No change in cough.  No nausea, vomiting, diarrhea, constipation.  No dysuria or change in urine color.  Remainder of review of systems was negative unless stated in the HPI.  Physical Exam:  Vitals:   10/02/18 1336  BP: (!) 143/76  Pulse: 64  Temp: 98 F (36.7 C)  TempSrc: Oral  SpO2: (!) 78%  Height: 5\' 7"  (1.702 m)    GENERAL: well appearing, in no apparent distress CARDIAC: heart regular rate and rhythm.  +3 pitting edema.  Elevated JVP. PULMONARY: Scattered rhonchi. No crackles.     Assessment & Plan:   See Encounters Tab for problem based charting.  Pertinent labs & imaging results that were available during my care of the patient were reviewed by me and considered in my medical decision making  Patient is in agreement with the plan and endorses no further questions at  this time.  Patient seen with Dr. Angelia Mould  Mitzi Hansen, MD Internal Medicine Resident-PGY1 10/02/18

## 2018-10-02 NOTE — Assessment & Plan Note (Signed)
Pressure is somewhat elevated at today's visit.  Likely contributable at least in part to his fluid overload.  We will continue to monitor at subsequent visits.

## 2018-10-03 ENCOUNTER — Emergency Department (HOSPITAL_COMMUNITY): Payer: Medicare HMO

## 2018-10-03 ENCOUNTER — Encounter (HOSPITAL_COMMUNITY): Payer: Self-pay | Admitting: Emergency Medicine

## 2018-10-03 ENCOUNTER — Other Ambulatory Visit: Payer: Self-pay

## 2018-10-03 ENCOUNTER — Inpatient Hospital Stay (HOSPITAL_COMMUNITY)
Admission: EM | Admit: 2018-10-03 | Discharge: 2018-10-11 | DRG: 208 | Disposition: A | Discharge status: Home Health | Payer: Medicare HMO | Attending: Internal Medicine | Admitting: Internal Medicine

## 2018-10-03 DIAGNOSIS — Z66 Do not resuscitate: Secondary | ICD-10-CM | POA: Diagnosis not present

## 2018-10-03 DIAGNOSIS — E1165 Type 2 diabetes mellitus with hyperglycemia: Secondary | ICD-10-CM | POA: Diagnosis not present

## 2018-10-03 DIAGNOSIS — R319 Hematuria, unspecified: Secondary | ICD-10-CM | POA: Diagnosis present

## 2018-10-03 DIAGNOSIS — R0902 Hypoxemia: Secondary | ICD-10-CM | POA: Diagnosis not present

## 2018-10-03 DIAGNOSIS — D638 Anemia in other chronic diseases classified elsewhere: Secondary | ICD-10-CM | POA: Diagnosis present

## 2018-10-03 DIAGNOSIS — R410 Disorientation, unspecified: Secondary | ICD-10-CM | POA: Diagnosis not present

## 2018-10-03 DIAGNOSIS — E870 Hyperosmolality and hypernatremia: Secondary | ICD-10-CM | POA: Diagnosis not present

## 2018-10-03 DIAGNOSIS — N179 Acute kidney failure, unspecified: Secondary | ICD-10-CM | POA: Diagnosis present

## 2018-10-03 DIAGNOSIS — R531 Weakness: Secondary | ICD-10-CM | POA: Diagnosis not present

## 2018-10-03 DIAGNOSIS — J9690 Respiratory failure, unspecified, unspecified whether with hypoxia or hypercapnia: Secondary | ICD-10-CM | POA: Diagnosis not present

## 2018-10-03 DIAGNOSIS — J811 Chronic pulmonary edema: Secondary | ICD-10-CM | POA: Diagnosis not present

## 2018-10-03 DIAGNOSIS — Z833 Family history of diabetes mellitus: Secondary | ICD-10-CM

## 2018-10-03 DIAGNOSIS — J9621 Acute and chronic respiratory failure with hypoxia: Secondary | ICD-10-CM | POA: Diagnosis not present

## 2018-10-03 DIAGNOSIS — I361 Nonrheumatic tricuspid (valve) insufficiency: Secondary | ICD-10-CM | POA: Diagnosis not present

## 2018-10-03 DIAGNOSIS — J9692 Respiratory failure, unspecified with hypercapnia: Secondary | ICD-10-CM

## 2018-10-03 DIAGNOSIS — N183 Chronic kidney disease, stage 3 (moderate): Secondary | ICD-10-CM | POA: Diagnosis present

## 2018-10-03 DIAGNOSIS — Z86718 Personal history of other venous thrombosis and embolism: Secondary | ICD-10-CM

## 2018-10-03 DIAGNOSIS — I130 Hypertensive heart and chronic kidney disease with heart failure and stage 1 through stage 4 chronic kidney disease, or unspecified chronic kidney disease: Secondary | ICD-10-CM | POA: Diagnosis present

## 2018-10-03 DIAGNOSIS — E875 Hyperkalemia: Secondary | ICD-10-CM | POA: Diagnosis not present

## 2018-10-03 DIAGNOSIS — N390 Urinary tract infection, site not specified: Secondary | ICD-10-CM | POA: Diagnosis present

## 2018-10-03 DIAGNOSIS — G9341 Metabolic encephalopathy: Secondary | ICD-10-CM | POA: Diagnosis not present

## 2018-10-03 DIAGNOSIS — J9602 Acute respiratory failure with hypercapnia: Secondary | ICD-10-CM | POA: Diagnosis not present

## 2018-10-03 DIAGNOSIS — E669 Obesity, unspecified: Secondary | ICD-10-CM | POA: Diagnosis present

## 2018-10-03 DIAGNOSIS — I5032 Chronic diastolic (congestive) heart failure: Secondary | ICD-10-CM | POA: Diagnosis present

## 2018-10-03 DIAGNOSIS — D329 Benign neoplasm of meninges, unspecified: Secondary | ICD-10-CM | POA: Diagnosis present

## 2018-10-03 DIAGNOSIS — Z79899 Other long term (current) drug therapy: Secondary | ICD-10-CM

## 2018-10-03 DIAGNOSIS — Z978 Presence of other specified devices: Secondary | ICD-10-CM

## 2018-10-03 DIAGNOSIS — Z6833 Body mass index (BMI) 33.0-33.9, adult: Secondary | ICD-10-CM | POA: Diagnosis not present

## 2018-10-03 DIAGNOSIS — I5033 Acute on chronic diastolic (congestive) heart failure: Secondary | ICD-10-CM | POA: Diagnosis present

## 2018-10-03 DIAGNOSIS — R402442 Other coma, without documented Glasgow coma scale score, or with partial score reported, at arrival to emergency department: Secondary | ICD-10-CM | POA: Diagnosis not present

## 2018-10-03 DIAGNOSIS — T501X6A Underdosing of loop [high-ceiling] diuretics, initial encounter: Secondary | ICD-10-CM | POA: Diagnosis present

## 2018-10-03 DIAGNOSIS — R4182 Altered mental status, unspecified: Secondary | ICD-10-CM | POA: Diagnosis not present

## 2018-10-03 DIAGNOSIS — Z20828 Contact with and (suspected) exposure to other viral communicable diseases: Secondary | ICD-10-CM | POA: Diagnosis present

## 2018-10-03 DIAGNOSIS — Z4682 Encounter for fitting and adjustment of non-vascular catheter: Secondary | ICD-10-CM | POA: Diagnosis not present

## 2018-10-03 DIAGNOSIS — N189 Chronic kidney disease, unspecified: Secondary | ICD-10-CM | POA: Diagnosis not present

## 2018-10-03 DIAGNOSIS — I491 Atrial premature depolarization: Secondary | ICD-10-CM | POA: Diagnosis not present

## 2018-10-03 DIAGNOSIS — J90 Pleural effusion, not elsewhere classified: Secondary | ICD-10-CM | POA: Diagnosis not present

## 2018-10-03 DIAGNOSIS — Z8249 Family history of ischemic heart disease and other diseases of the circulatory system: Secondary | ICD-10-CM

## 2018-10-03 DIAGNOSIS — D320 Benign neoplasm of cerebral meninges: Secondary | ICD-10-CM | POA: Diagnosis not present

## 2018-10-03 DIAGNOSIS — J9811 Atelectasis: Secondary | ICD-10-CM | POA: Diagnosis not present

## 2018-10-03 DIAGNOSIS — E876 Hypokalemia: Secondary | ICD-10-CM | POA: Diagnosis not present

## 2018-10-03 DIAGNOSIS — H9190 Unspecified hearing loss, unspecified ear: Secondary | ICD-10-CM | POA: Diagnosis present

## 2018-10-03 DIAGNOSIS — Z452 Encounter for adjustment and management of vascular access device: Secondary | ICD-10-CM | POA: Diagnosis not present

## 2018-10-03 DIAGNOSIS — I69993 Ataxia following unspecified cerebrovascular disease: Secondary | ICD-10-CM | POA: Diagnosis not present

## 2018-10-03 DIAGNOSIS — J9691 Respiratory failure, unspecified with hypoxia: Secondary | ICD-10-CM

## 2018-10-03 DIAGNOSIS — I6782 Cerebral ischemia: Secondary | ICD-10-CM | POA: Diagnosis not present

## 2018-10-03 DIAGNOSIS — F0390 Unspecified dementia without behavioral disturbance: Secondary | ICD-10-CM | POA: Diagnosis not present

## 2018-10-03 DIAGNOSIS — Z9181 History of falling: Secondary | ICD-10-CM

## 2018-10-03 DIAGNOSIS — Z9911 Dependence on respirator [ventilator] status: Secondary | ICD-10-CM | POA: Diagnosis not present

## 2018-10-03 DIAGNOSIS — E785 Hyperlipidemia, unspecified: Secondary | ICD-10-CM | POA: Diagnosis present

## 2018-10-03 DIAGNOSIS — J9601 Acute respiratory failure with hypoxia: Principal | ICD-10-CM | POA: Diagnosis present

## 2018-10-03 DIAGNOSIS — G3281 Cerebellar ataxia in diseases classified elsewhere: Secondary | ICD-10-CM | POA: Diagnosis not present

## 2018-10-03 DIAGNOSIS — Z91138 Patient's unintentional underdosing of medication regimen for other reason: Secondary | ICD-10-CM

## 2018-10-03 DIAGNOSIS — J810 Acute pulmonary edema: Secondary | ICD-10-CM | POA: Diagnosis not present

## 2018-10-03 DIAGNOSIS — Z9079 Acquired absence of other genital organ(s): Secondary | ICD-10-CM

## 2018-10-03 DIAGNOSIS — R404 Transient alteration of awareness: Secondary | ICD-10-CM | POA: Diagnosis not present

## 2018-10-03 DIAGNOSIS — R0602 Shortness of breath: Secondary | ICD-10-CM | POA: Diagnosis not present

## 2018-10-03 DIAGNOSIS — J9600 Acute respiratory failure, unspecified whether with hypoxia or hypercapnia: Secondary | ICD-10-CM | POA: Diagnosis not present

## 2018-10-03 DIAGNOSIS — R0600 Dyspnea, unspecified: Secondary | ICD-10-CM

## 2018-10-03 LAB — BRAIN NATRIURETIC PEPTIDE: B Natriuretic Peptide: 389.3 pg/mL — ABNORMAL HIGH (ref 0.0–100.0)

## 2018-10-03 LAB — CBC WITH DIFFERENTIAL/PLATELET
Abs Immature Granulocytes: 0.08 10*3/uL — ABNORMAL HIGH (ref 0.00–0.07)
Basophils Absolute: 0 10*3/uL (ref 0.0–0.1)
Basophils Relative: 0 %
Eosinophils Absolute: 0.1 10*3/uL (ref 0.0–0.5)
Eosinophils Relative: 1 %
HCT: 29.7 % — ABNORMAL LOW (ref 39.0–52.0)
Hemoglobin: 8.3 g/dL — ABNORMAL LOW (ref 13.0–17.0)
Immature Granulocytes: 1 %
Lymphocytes Relative: 16 %
Lymphs Abs: 1.4 10*3/uL (ref 0.7–4.0)
MCH: 25.9 pg — ABNORMAL LOW (ref 26.0–34.0)
MCHC: 27.9 g/dL — ABNORMAL LOW (ref 30.0–36.0)
MCV: 92.5 fL (ref 80.0–100.0)
Monocytes Absolute: 0.5 10*3/uL (ref 0.1–1.0)
Monocytes Relative: 6 %
Neutro Abs: 6.5 10*3/uL (ref 1.7–7.7)
Neutrophils Relative %: 76 %
Platelets: 241 10*3/uL (ref 150–400)
RBC: 3.21 MIL/uL — ABNORMAL LOW (ref 4.22–5.81)
RDW: 15.7 % — ABNORMAL HIGH (ref 11.5–15.5)
WBC: 8.6 10*3/uL (ref 4.0–10.5)
nRBC: 0 % (ref 0.0–0.2)

## 2018-10-03 LAB — COMPREHENSIVE METABOLIC PANEL
ALT: 21 U/L (ref 0–44)
AST: 35 U/L (ref 15–41)
Albumin: 3.2 g/dL — ABNORMAL LOW (ref 3.5–5.0)
Alkaline Phosphatase: 48 U/L (ref 38–126)
Anion gap: 7 (ref 5–15)
BUN: 19 mg/dL (ref 8–23)
CO2: 29 mmol/L (ref 22–32)
Calcium: 9.3 mg/dL (ref 8.9–10.3)
Chloride: 108 mmol/L (ref 98–111)
Creatinine, Ser: 1.21 mg/dL (ref 0.61–1.24)
GFR calc Af Amer: >60 mL/min (ref >60)
GFR calc non Af Amer: 52 mL/min — ABNORMAL LOW (ref >60)
Glucose, Bld: 177 mg/dL — ABNORMAL HIGH (ref 70–99)
Potassium: 4.4 mmol/L (ref 3.5–5.1)
Sodium: 144 mmol/L (ref 135–145)
Total Bilirubin: 0.6 mg/dL (ref 0.3–1.2)
Total Protein: 7 g/dL (ref 6.5–8.1)

## 2018-10-03 LAB — URINALYSIS, ROUTINE W REFLEX MICROSCOPIC
Bilirubin Urine: NEGATIVE
Glucose, UA: NEGATIVE mg/dL
Ketones, ur: NEGATIVE mg/dL
Nitrite: NEGATIVE
Protein, ur: 100 mg/dL — AB
Specific Gravity, Urine: 1.015 (ref 1.005–1.030)
WBC, UA: >50 WBC/hpf — ABNORMAL HIGH (ref 0–5)
pH: 5 (ref 5.0–8.0)

## 2018-10-03 LAB — POCT I-STAT 7, (LYTES, BLD GAS, ICA,H+H)
Acid-Base Excess: 6 mmol/L — ABNORMAL HIGH (ref 0.0–2.0)
Bicarbonate: 31.9 mmol/L — ABNORMAL HIGH (ref 20.0–28.0)
Calcium, Ion: 1.34 mmol/L (ref 1.15–1.40)
HCT: 25 % — ABNORMAL LOW (ref 39.0–52.0)
Hemoglobin: 8.5 g/dL — ABNORMAL LOW (ref 13.0–17.0)
O2 Saturation: 99 %
Patient temperature: 90.3
Potassium: 4.6 mmol/L (ref 3.5–5.1)
Sodium: 146 mmol/L — ABNORMAL HIGH (ref 135–145)
TCO2: 34 mmol/L — ABNORMAL HIGH (ref 22–32)
pCO2 arterial: 46.5 mmHg (ref 32.0–48.0)
pH, Arterial: 7.424 (ref 7.350–7.450)
pO2, Arterial: 111 mmHg — ABNORMAL HIGH (ref 83.0–108.0)

## 2018-10-03 LAB — MAGNESIUM: Magnesium: 1.9 mg/dL (ref 1.7–2.4)

## 2018-10-03 LAB — MRSA PCR SCREENING: MRSA by PCR: POSITIVE — AB

## 2018-10-03 LAB — LACTIC ACID, PLASMA
Lactic Acid, Venous: 0.8 mmol/L (ref 0.5–1.9)
Lactic Acid, Venous: 0.7 mmol/L (ref 0.5–1.9)

## 2018-10-03 LAB — TROPONIN I (HIGH SENSITIVITY)
Troponin I (High Sensitivity): 15 ng/L (ref <18)
Troponin I (High Sensitivity): 16 ng/L (ref <18)

## 2018-10-03 LAB — SARS CORONAVIRUS 2 BY RT PCR (HOSPITAL ORDER, PERFORMED IN ~~LOC~~ HOSPITAL LAB): SARS Coronavirus 2: NEGATIVE

## 2018-10-03 MED ORDER — ETOMIDATE 2 MG/ML IV SOLN
INTRAVENOUS | Status: AC | PRN
  Administered 2018-10-03: 30 mg via INTRAVENOUS

## 2018-10-03 MED ORDER — MIDAZOLAM HCL 2 MG/2ML IJ SOLN: 1.0000 mg | INTRAMUSCULAR | Status: DC | PRN

## 2018-10-03 MED ORDER — SODIUM CHLORIDE 0.9 % IV SOLN
2.0000 g | Freq: Once | INTRAVENOUS | Status: AC
  Administered 2018-10-03: 2 g via INTRAVENOUS
  Filled 2018-10-03: qty 2

## 2018-10-03 MED ORDER — ORAL CARE MOUTH RINSE
15.0000 mL | OROMUCOSAL | Status: DC
  Administered 2018-10-03 – 2018-10-06 (×26): 15 mL via OROMUCOSAL

## 2018-10-03 MED ORDER — CHLORHEXIDINE GLUCONATE CLOTH 2 % EX PADS
6.0000 | MEDICATED_PAD | Freq: Every day | CUTANEOUS | Status: DC
  Administered 2018-10-04 – 2018-10-11 (×6): 6 via TOPICAL

## 2018-10-03 MED ORDER — SODIUM CHLORIDE 0.9 % IV SOLN
2.0000 g | Freq: Two times a day (BID) | INTRAVENOUS | Status: DC
  Administered 2018-10-04 – 2018-10-05 (×3): 2 g via INTRAVENOUS
  Filled 2018-10-03 (×4): qty 2

## 2018-10-03 MED ORDER — ROCURONIUM BROMIDE 50 MG/5ML IV SOLN
INTRAVENOUS | Status: AC | PRN
  Administered 2018-10-03: 80 mg via INTRAVENOUS

## 2018-10-03 MED ORDER — FENTANYL CITRATE (PF) 100 MCG/2ML IJ SOLN
25.0000 ug | INTRAMUSCULAR | Status: DC | PRN
  Administered 2018-10-03: 25 ug via INTRAVENOUS
  Administered 2018-10-04: 50 ug via INTRAVENOUS
  Administered 2018-10-04: 25 ug via INTRAVENOUS
  Administered 2018-10-05: 50 ug via INTRAVENOUS
  Administered 2018-10-05: 25 ug via INTRAVENOUS
  Administered 2018-10-05 (×2): 50 ug via INTRAVENOUS
  Administered 2018-10-05: 25 ug via INTRAVENOUS
  Administered 2018-10-06 (×2): 100 ug via INTRAVENOUS
  Filled 2018-10-03 (×10): qty 2

## 2018-10-03 MED ORDER — FENTANYL CITRATE (PF) 100 MCG/2ML IJ SOLN
25.0000 ug | INTRAMUSCULAR | Status: DC | PRN
  Administered 2018-10-04: 25 ug via INTRAVENOUS
  Filled 2018-10-03: qty 2

## 2018-10-03 MED ORDER — CHLORHEXIDINE GLUCONATE 0.12% ORAL RINSE (MEDLINE KIT)
15.0000 mL | Freq: Two times a day (BID) | OROMUCOSAL | Status: DC
  Administered 2018-10-03 – 2018-10-06 (×6): 15 mL via OROMUCOSAL

## 2018-10-03 MED ORDER — FENTANYL CITRATE (PF) 100 MCG/2ML IJ SOLN
50.0000 ug | Freq: Once | INTRAMUSCULAR | Status: AC
  Administered 2018-10-03: 50 ug via INTRAVENOUS
  Filled 2018-10-03: qty 2

## 2018-10-03 MED ORDER — VANCOMYCIN HCL 10 G IV SOLR
1750.0000 mg | Freq: Once | INTRAVENOUS | Status: AC
  Administered 2018-10-03: 1750 mg via INTRAVENOUS
  Filled 2018-10-03: qty 1750

## 2018-10-03 MED ORDER — HEPARIN SODIUM (PORCINE) 5000 UNIT/ML IJ SOLN
5000.0000 [IU] | Freq: Three times a day (TID) | INTRAMUSCULAR | Status: DC
  Administered 2018-10-03 – 2018-10-09 (×17): 5000 [IU] via SUBCUTANEOUS
  Filled 2018-10-03 (×17): qty 1

## 2018-10-03 MED ORDER — FUROSEMIDE 10 MG/ML IJ SOLN
40.0000 mg | Freq: Once | INTRAMUSCULAR | Status: AC
  Administered 2018-10-03: 40 mg via INTRAVENOUS
  Filled 2018-10-03: qty 4

## 2018-10-03 MED ORDER — VANCOMYCIN HCL 10 G IV SOLR
1750.0000 mg | INTRAVENOUS | Status: DC
  Filled 2018-10-03: qty 1750

## 2018-10-03 MED ORDER — HYDRALAZINE HCL 20 MG/ML IJ SOLN: 10.0000 mg | INTRAMUSCULAR | Status: DC | PRN

## 2018-10-03 MED ORDER — IOPAMIDOL (ISOVUE-300) INJECTION 61%
100.0000 mL | Freq: Once | INTRAVENOUS | Status: AC | PRN
  Administered 2018-10-03: 100 mL via INTRAVENOUS

## 2018-10-03 MED ORDER — PANTOPRAZOLE SODIUM 40 MG IV SOLR
40.0000 mg | Freq: Every day | INTRAVENOUS | Status: DC
  Administered 2018-10-03 – 2018-10-06 (×4): 40 mg via INTRAVENOUS
  Filled 2018-10-03 (×4): qty 40

## 2018-10-03 MED ORDER — MIDAZOLAM HCL 2 MG/2ML IJ SOLN
1.0000 mg | INTRAMUSCULAR | Status: DC | PRN
  Administered 2018-10-06: 1 mg via INTRAVENOUS
  Filled 2018-10-03: qty 2

## 2018-10-03 NOTE — ED Provider Notes (Signed)
Procedure Name: Intubation Date/Time: 10/03/2018 3:40 PM Performed by: Jefm Petty, MD Pre-anesthesia Checklist: Patient identified, Emergency Drugs available, Suction available, Patient being monitored and Timeout performed Preoxygenation: Pre-oxygenation with 100% oxygen Induction Type: Rapid sequence Laryngoscope Size: Glidescope Grade View: Grade II Tube type: Subglottic suction tube Tube size: 8.0 mm Number of attempts: 1 Airway Equipment and Method: Video-laryngoscopy and Rigid stylet Placement Confirmation: ETT inserted through vocal cords under direct vision,  CO2 detector and Breath sounds checked- equal and bilateral Secured at: 24 (teeth) cm Tube secured with: ETT holder Difficulty Due To: Difficulty was anticipated Comments: Rapid Sequence Induction directly from BiPAP device without BVM ventilations between. Glottis with narrow opening and grade 2a view created minor difficulty in advancing ETT to appropriate depth. Otherwise, intubated on 1st pass attempt without desaturation. Attending physician, Dr. Virgel Manifold at the bedside for the entirety of the procedure.       Manar Smalling A. Jimmye Norman, MD Resident Physician, PGY-3 Emergency Medicine [facility]    Jefm Petty, MD 10/03/18 1558    Virgel Manifold, MD 10/04/18 [phone]

## 2018-10-03 NOTE — Sedation Documentation (Signed)
Pt sats 88% on bipap and lower at times, MD aware. Pt having brady dips into 40's. Preparing to intubate.

## 2018-10-03 NOTE — Progress Notes (Signed)
Pharmacy Antibiotic Note  [name] is a 85 y.o. male with PMH of HTN, diastolic HF, meningoma, CVA, and CKD. He presents to the ED on 10/03/2018 for AMS. Pt was found to be hypoxic with oxygen saturation of 75% on RA. PT was being treated for UTI outpatient with Keflex prior to arrival. Pharmacy has been consulted for vancomycin/cefepime dosing due to concern for sepsis.  Plan: Vancomycin 1750 mg IV X 1 Cefepime 2g IV X 1  Waiting for BMP to result prior to selecting maintenance doses.  F/U on WBC, S.cr, BP, and clinical status of the patient. BCs, Urine analysis, and covid-19 test have been collected.   Temp (24hrs), Avg:98 F (36.7 C), Min:98 F (36.7 C), Max:98 F (36.7 C)  No results for input(s): WBC, CREATININE, LATICACIDVEN, VANCOTROUGH, VANCOPEAK, VANCORANDOM, GENTTROUGH, GENTPEAK, GENTRANDOM, TOBRATROUGH, TOBRAPEAK, TOBRARND, AMIKACINPEAK, AMIKACINTROU, AMIKACIN in the last 168 hours.  CrCl cannot be calculated (Patient's most recent lab result is older than the maximum 21 days allowed.).    No Known Allergies  Antimicrobials this admission: Vancomycin 07/23 >>  Cefepime 07/23 >>  Microbiology results: 07/23 BCx: collected 07/23 UCx: collected  Thank you for allowing pharmacy to be a part of this patient's care.  Sherren Kerns, PharmD PGY1 Acute Care Pharmacy Resident [phone] 10/03/2018 11:57 AM

## 2018-10-03 NOTE — ED Notes (Signed)
Gave pt's personal belonging bag with dentures, watch and shoes to pt's dtr.

## 2018-10-03 NOTE — ED Notes (Signed)
bair Hugger applied

## 2018-10-03 NOTE — H&P (Signed)
NAME:  [name], MRN:  [id number], DOB:  [birth year], LOS: 0 ADMISSION DATE:  10/03/2018, CONSULTATION DATE:  10/03/18 REFERRING MD:  Wilson Singer  CHIEF COMPLAINT:  AMS   Brief History   [name] is a 85 y.o. male who was admitted 7/23 with AMS felt to be due to UTI and hypercapnic / hypoxic respiratory failure.  Required intubation after failing BiPAP.  History of present illness   Pt is encephelopathic; therefore, this HPI is obtained from chart review. [name] is a 85 y.o. male who has a PMH including but not limited to CVA, meningioma, HTN, HLD, DVT, anemia.  He presented to [facility] ED 7/23 with AMS.  On EMS arrival, he had sats 75% which improved to 100% on NRB.  ABG demonstrated pCO2 in the 90s.  He was placed on BiPAP but failed and had increasing O2 requirements; therefore, required intubation.  UA also suggestive of UTI.  He had been seen by PCP 7/22 for leg swelling, dyspnea, AMS.  Found to be hypoxic.  Symptoms felt to be due to fluid overload 2/2 pt not taking lasix for about a month (stopped due to incontinence).  Also started on keflex x 5 days for possible UTI (per daughter, pt has frequent UTI's with similar presentation of AMS).  Past Medical History  CVA 2013, meningioma, HTN, HLD, DVT 2015, BPH, anemia.  Significant Hospital Events   7/23 > admit.  Consults:  PCCM.  Procedures:  ETT 7/23 >   Significant Diagnostic Tests:  CXR 7/23 > edema. CT head 7/23 > no acute findings, chronic ischemic changes, chronic 2.8cm partially calcified meningioma of left sphenoid wing.  Micro Data:  Blood 7/23 >  Sputum 7/23 >  Urine 7/23 >  SARS CoV2 7/23 > negative.  Antimicrobials:  Vanc 7/23 >  Cefepime 7/23 >    Interim history/subjective:  Comfortable on vent.  Not responsive.  Objective:  Blood pressure 117/74, pulse (!) 57, temperature (!) 90 F (32.2 C), resp. rate (!) 22, height 5\' 7"  (1.702 m), weight 99 kg, SpO2 100 %.    Vent Mode: PRVC FiO2 (%):   [50 %-100 %] 100 % Set Rate:  [16 bmp-22 bmp] 22 bmp Vt Set:  [520 mL] 520 mL PEEP:  [5 cmH20] 5 cmH20 Plateau Pressure:  [19 cmH20] 19 cmH20   Intake/Output Summary (Last 24 hours) at 10/03/2018 1611 Last data filed at 10/03/2018 1603 Gross per 24 hour  Intake -  Output 350 ml  Net -350 ml   Filed Weights   10/03/18 1201  Weight: 99 kg    Examination: General: Elderly male, chronically ill appearing, in NAD. Neuro: Non-responsive. HEENT: [place]/AT. Sclerae anicteric.  ETT in place. Cardiovascular: RRR, no M/R/G.  Lungs: Respirations even and unlabored.  CTA bilaterally, No W/R/R.  Abdomen: BS x 4, soft, NT/ND.  Musculoskeletal: No gross deformities,1+ edema.  Skin: Skin changes noted to bilateral LE's.  Skin warm, no rashes.  Assessment & Plan:   Acute hypoxic and hypercapnic respiratory failure - s/p intubation after failing BiPAP. - Full vent support. - Assess ABG. - Wean as able. - Daily SBT. - Bronchial hygiene. - Follow CXR.  Acute pulmonary edema - s/p 40mg  lasix. Echo from Aug 2019 with EF 65 - 70%, G1DD). - Follow I/O's and CXR.  Altered Mental Status - 2/2 above + UTI. - Continue vent support as above. - Empiric vanc / cefepime for now and narrow as able.  Anemia of chronic disease. -  Transfuse for Hgb < 7.  Hx HTN, HLD. - Hold preadmission amlodipine, atorvastatin, furosemide, toprol-xl.  Best Practice:  Diet: NPO. Pain/Anxiety/Delirium protocol (if indicated): Fentanyl PRN / Midazolam PRN.  RASS goal 0. VAP protocol (if indicated): In place. DVT prophylaxis: SCD's / Heparin. GI prophylaxis: PPI. Glucose control: N/A. Mobility: Bedrest. Code Status: DNR. Family Communication: Updated daughter [name] over the phone who states to continue full supportive care; however, in the event of arrest, to not perform CPR / ACLS. Disposition: ICU.  Labs   CBC: Recent Labs  Lab 10/03/18 1136  WBC 8.6  NEUTROABS 6.5  HGB 8.3*  HCT 29.7*  MCV 92.5  PLT  433   Basic Metabolic Panel: Recent Labs  Lab 10/03/18 1136  NA 144  K 4.4  CL 108  CO2 29  GLUCOSE 177*  BUN 19  CREATININE 1.21  CALCIUM 9.3  MG 1.9   GFR: Estimated Creatinine Clearance: 44.6 mL/min (by C-G formula based on SCr of 1.21 mg/dL). Recent Labs  Lab 10/03/18 1136 10/03/18 1336  WBC 8.6  --   LATICACIDVEN 0.8 0.7   Liver Function Tests: Recent Labs  Lab 10/03/18 1136  AST 35  ALT 21  ALKPHOS 48  BILITOT 0.6  PROT 7.0  ALBUMIN 3.2*   No results for input(s): LIPASE, AMYLASE in the last 168 hours. No results for input(s): AMMONIA in the last 168 hours. ABG    Component Value Date/Time   TCO2 29 02/07/2017 0054    Coagulation Profile: No results for input(s): INR, PROTIME in the last 168 hours. Cardiac Enzymes: No results for input(s): CKTOTAL, CKMB, CKMBINDEX, TROPONINI in the last 168 hours. HbA1C: Hgb A1c MFr Bld  Date/Time Value Ref Range Status  12/19/2013 04:27 AM 5.8 (H) <5.7 % Final    Comment:    (NOTE)                                                                       According to the ADA Clinical Practice Recommendations for 2011, when HbA1c is used as a screening test:  >=6.5%   Diagnostic of Diabetes Mellitus           (if abnormal result is confirmed) 5.7-6.4%   Increased risk of developing Diabetes Mellitus References:Diagnosis and Classification of Diabetes Mellitus,Diabetes Care,2011,34(Suppl 1):S62-S69 and Standards of Medical Care in         Diabetes - 2011,Diabetes IRJJ,8841,66 (Suppl 1):S11-S61.  07/20/2011 05:10 AM 5.8 (H) <5.7 % Final    Comment:    (NOTE)                                                                       According to the ADA Clinical Practice Recommendations for 2011, when HbA1c is used as a screening test:  >=6.5%   Diagnostic of Diabetes Mellitus           (if abnormal result is confirmed) 5.7-6.4%   Increased risk of developing Diabetes Mellitus References:Diagnosis and Classification of  Diabetes Mellitus,Diabetes ZSWF,0932,35(TDDUK 1):S62-S69 and Standards of Medical Care in         Diabetes - 2011,Diabetes GURK,2706,23 (Suppl 1):S11-S61.   CBG: No results for input(s): GLUCAP in the last 168 hours.  Review of Systems:   Unable to obtain as pt is encephalopathic.  Past medical history  He,  has a past medical history of Anemia, BPH (benign prostatic hypertrophy), Cerebral embolism with cerebral infarction ([place]) (12/19/2013), Chronic kidney disease, Difficulty hearing, right, DVT (deep venous thrombosis) ([place]), Frequent falls, History of DVT (deep vein thrombosis) (06/10/2013), Hyperlipidemia, Hypertension, Incontinence of urine, Left adrenal mass ([place]) (09/11/2013), Meningioma ([place]), Stroke ([place]), Thrombocytopenia ([place]), and Vitreous hemorrhage ([place]) (12/21/2013).   Surgical History    Past Surgical History:  Procedure Laterality Date  . CYSTOSCOPY N/A 06/13/2013   Procedure: CYSTOSCOPY FLEXIBLE BEDSIDE;  Surgeon: Ardis Hughs, MD;  Location: [facility];  Service: Urology;  Laterality: N/A;  . MYRINGOTOMY WITH TUBE PLACEMENT Bilateral   . TRANSURETHRAL RESECTION OF PROSTATE N/A 05/19/2013   Procedure: TRANSURETHRAL RESECTION OF THE PROSTATE WITH GYRUS INSTRUMENTS;  Surgeon: Ailene Rud, MD;  Location: [facility];  Service: Urology;  Laterality: N/A;     Social History   reports that he has never smoked. He has never used smokeless tobacco. He reports that he does not drink alcohol or use drugs.   Family history   His family history includes Diabetes in his mother; Heart disease in his father and mother; Hypertension in his daughter, father, and mother.   Allergies No Known Allergies   Home meds  Prior to Admission medications   Medication Sig Start Date End Date Taking? Authorizing Provider  amLODipine (NORVASC) 5 MG tablet Take 1 tablet (5 mg total) by mouth daily. Patient taking differently: Take 5 mg by mouth at bedtime.  04/26/18  Yes [name], DO   atorvastatin (LIPITOR) 20 MG tablet Take 1 tablet (20 mg total) by mouth daily at 6 PM. 04/26/18  Yes Hoffman, Rachel Moulds, DO  cephALEXin (KEFLEX) 500 MG capsule Take 1 capsule (500 mg total) by mouth 2 (two) times daily for 5 days. 10/02/18 10/07/18 Yes Christian, Rylee, MD  furosemide (LASIX) 40 MG tablet Take 1 tablet (40 mg total) by mouth daily as needed for fluid or edema (Weight Gain of 5lbs in a week). 10/02/18  Yes Christian, Rylee, MD  metoprolol succinate (TOPROL XL) 50 MG 24 hr tablet Take 1 tablet (50 mg total) by mouth daily. Take with or immediately following a meal. 04/26/18 04/26/19 Yes Hoffman, Rachel Moulds, DO  triamcinolone ointment (KENALOG) 0.5 % Apply 1 application topically as needed (rash). For rash on legs 09/06/18  Yes [provider]    Critical care time: 45 min.    Montey Hora, [place] [facility] Pager: [phone].  If no answer, [phone] 10/03/2018, 4:11 PM

## 2018-10-03 NOTE — ED Notes (Signed)
Called main lab to run urine culture

## 2018-10-03 NOTE — ED Notes (Signed)
Pt's false teeth, silver watch and shoes placed in a pt belonging bag and labeled.

## 2018-10-03 NOTE — ED Provider Notes (Addendum)
[facility] EMERGENCY DEPARTMENT Provider Note   CSN: [id number] Arrival date & time: 10/03/18  1126     History   Chief Complaint Chief Complaint  Patient presents with   Altered Mental Status    HPI [name] is a 85 y.o. male.  HPI   85 year old male brought in by EMS from home.  They were called out for worsening mental status.  Patient was seen in his PCPs office yesterday for similar complaints.  He was brought to the doctor by his daughter.  Apparently has been complaining of shortness of breath recently.  He is incontinent of urine as of his daughter was not regularly given him a Lasix.  This was restarted for fluid overload.  He was initially hypoxic in the clinic on room air and briefly on supplemental oxygen but able to be quickly weaned down in the office.  Daughter was advised to do spot checks of his O2 sat at home.  There was also concern for possible UTI as etiology of his increased confusion but it appears that they were unable to obtain a sample during his visit. Started on keflex.   Currently he is essentially nonverbal. Does attempt to answer but incomprehensible. He is not following commands. Seems to have R side gaze preference to me but EMS reports observing movement past midline to L on several occasions. He does move all extremities although L more than R. On occasion reaches up to NRB with L hand.   Past Medical History:  Diagnosis Date   Anemia    BPH (benign prostatic hypertrophy)    TURP 05/19/13   Cerebral embolism with cerebral infarction ([place]) 12/19/2013   Chronic kidney disease    CHRONIC KIDNEY DISEASE, 2   Difficulty hearing, right    BILATERAL HEARING LOSS - BEST TO TRY TO SPEAK INTO LEFT EAR   DVT (deep venous thrombosis) (HCC)    Frequent falls    History of DVT (deep vein thrombosis) 06/10/2013   Provoked s/p TURP. Dx per doppler 06/10/13. Complicated by hematuria while on anticoagulation s/p TURP. Initially on  lovenox and coumadin, then stopped, then IVC filter placed 06/16/13. Anticoagulation restarted after hematuria stopped. Lovenox was discontinued apparently on 07/29/13 with comment on high risk for falls.   Total ~1.5 months of anticoagulation but interrupted with bleeding complication.   Hyperlipidemia    Hypertension    Incontinence of urine    SOME INCONTINENCE   Left adrenal mass ([place]) 09/11/2013   Meningioma ([place])    Stroke ([place])    Cerebellar, 2013; WALKS WITH WALKER, ABLE TO DRESS AND BATHE HIMSELF BUT FAMILY TRIES TO PROVIDE SUPERVISION BECAUSE OF HIS HX OF FALL AND WEAKNESS LEGS, ARMS    Thrombocytopenia ([place])    Vitreous hemorrhage ([place]) 12/21/2013   OS 12/18/13 CT /MRI brain done for ataxia     Patient Active Problem List   Diagnosis Date Noted   Chronic diastolic (congestive) heart failure ([place]) 10/28/2017   Renal cyst 10/28/2017   Shortness of breath on exertion 10/03/2017   Generalized weakness 02/19/2017   Cerebellar ataxia in diseases classified elsewhere ([place]) 02/13/2017   Left ankle swelling 08/03/2016   Dementia ([place]) 01/28/2016   Hypernatremia    UTI (urinary tract infection) 07/17/2014   Presence of IVC filter 07/17/2014   Onychomycosis 01/15/2014   Orthostatic hypotension 07/10/2013   CKD (chronic kidney disease) stage 3, GFR 30-59 ml/min ([place]) 06/23/2013   Hyperlipidemia    Urinary retention 06/15/2013  Normocytic anemia 06/11/2013   Physical deconditioning 05/30/2013   BPH (benign prostatic hyperplasia) 04/24/2013   Frequent falls 06/23/2012   Ataxia, 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   Hearing loss 07/20/2011   Meningioma ([place]) 07/20/2011   HTN (hypertension) 07/19/2011   CVA (cerebral infarction) 07/19/2011    Past Surgical History:  Procedure Laterality Date   CYSTOSCOPY N/A 06/13/2013   Procedure: CYSTOSCOPY FLEXIBLE BEDSIDE;  Surgeon: Ardis Hughs, MD;  Location: [place];  Service: Urology;   Laterality: N/A;   MYRINGOTOMY WITH TUBE PLACEMENT Bilateral    TRANSURETHRAL RESECTION OF PROSTATE N/A 05/19/2013   Procedure: TRANSURETHRAL RESECTION OF THE PROSTATE WITH GYRUS INSTRUMENTS;  Surgeon: Ailene Rud, MD;  Location: [facility];  Service: Urology;  Laterality: N/A;        Home Medications    Prior to Admission medications   Medication Sig Start Date End Date Taking? Authorizing Provider  amLODipine (NORVASC) 5 MG tablet Take 1 tablet (5 mg total) by mouth daily. 04/26/18   [name], DO  atorvastatin (LIPITOR) 20 MG tablet Take 1 tablet (20 mg total) by mouth daily at 6 PM. 04/26/18   [facility], Rachel Moulds, DO  cephALEXin (KEFLEX) 500 MG capsule Take 1 capsule (500 mg total) by mouth 2 (two) times daily for 5 days. 10/02/18 10/07/18  Mitzi Hansen, MD  furosemide (LASIX) 40 MG tablet Take 1 tablet (40 mg total) by mouth daily as needed for fluid or edema (Weight Gain of 5lbs in a week). 10/02/18   Mitzi Hansen, MD  metoprolol succinate (TOPROL XL) 50 MG 24 hr tablet Take 1 tablet (50 mg total) by mouth daily. Take with or immediately following a meal. 04/26/18 04/26/19  [name], DO    Family History Family History  Problem Relation Age of Onset   Diabetes Mother    Heart disease Mother    Hypertension Mother    Heart disease Father    Hypertension Father    Hypertension Daughter     Social History Social History   Tobacco Use   Smoking status: Never Smoker   Smokeless tobacco: Never Used  Substance Use Topics   Alcohol use: No    Alcohol/week: 0.0 standard drinks   Drug use: No     Allergies   Patient has no known allergies.   Review of Systems Review of Systems Level 5 caveat because of confusion.   Physical Exam Updated Vital Signs BP (!) 146/82 (BP Location: Right Arm)    Pulse (!) 58    Resp (!) 28    SpO2 100%   Physical Exam Vitals signs and nursing note reviewed.  Constitutional:      Appearance: He is  well-developed.     Comments: Laying in bed with eyes open.   HENT:     Head: Normocephalic and atraumatic.  Eyes:     General:        Right eye: No discharge.        Left eye: No discharge.     Conjunctiva/sclera: Conjunctivae normal.     Pupils: Pupils are equal, round, and reactive to light.  Neck:     Musculoskeletal: Neck supple.  Cardiovascular:     Rate and Rhythm: Normal rate and regular rhythm.     Heart sounds: Normal heart sounds. No murmur. No friction rub. No gallop.   Pulmonary:     Effort: Pulmonary effort is normal. No respiratory distress.     Breath sounds: Normal breath  sounds.  Abdominal:     General: There is no distension.     Palpations: Abdomen is soft.     Tenderness: There is no abdominal tenderness.  Musculoskeletal:        General: No tenderness.     Right lower leg: Edema present.     Left lower leg: Edema present.     Comments: Symmetric pitting LE edema  Skin:    General: Skin is warm and dry.  Neurological:     Mental Status: He is alert.     Comments: Moves mouth seemingly to respond but incomprehensible. (?) R gaze preference. No obvious facial asymmetry. Withdrawals all extremities to pain. Not following commands. Reached for NRB with L hand.       ED Treatments / Results  Labs (all labs ordered are listed, but only abnormal results are displayed) Labs Reviewed  MRSA PCR SCREENING - Abnormal; Notable for the following components:      Result Value   MRSA by PCR POSITIVE (*)    All other components within normal limits  CBC WITH DIFFERENTIAL/PLATELET - Abnormal; Notable for the following components:   RBC 3.21 (*)    Hemoglobin 8.3 (*)    HCT 29.7 (*)    MCH 25.9 (*)    MCHC 27.9 (*)    RDW 15.7 (*)    Abs Immature Granulocytes 0.08 (*)    All other components within normal limits  COMPREHENSIVE METABOLIC PANEL - Abnormal; Notable for the following components:   Glucose, Bld 177 (*)    Albumin 3.2 (*)    GFR calc non Af Amer 52  (*)    All other components within normal limits  URINALYSIS, ROUTINE W REFLEX MICROSCOPIC - Abnormal; Notable for the following components:   APPearance HAZY (*)    Hgb urine dipstick MODERATE (*)    Protein, ur 100 (*)    Leukocytes,Ua MODERATE (*)    WBC, UA >50 (*)    Bacteria, UA RARE (*)    All other components within normal limits  BRAIN NATRIURETIC PEPTIDE - Abnormal; Notable for the following components:   B Natriuretic Peptide 389.3 (*)    All other components within normal limits  CBC - Abnormal; Notable for the following components:   RBC 2.89 (*)    Hemoglobin 7.4 (*)    HCT 25.3 (*)    MCH 25.6 (*)    MCHC 29.2 (*)    RDW 15.7 (*)    All other components within normal limits  BASIC METABOLIC PANEL - Abnormal; Notable for the following components:   Sodium 147 (*)    Creatinine, Ser 1.52 (*)    GFR calc non Af Amer 39 (*)    GFR calc Af Amer 46 (*)    All other components within normal limits  PHOSPHORUS - Abnormal; Notable for the following components:   Phosphorus 1.5 (*)    All other components within normal limits  POCT I-STAT 7, (LYTES, BLD GAS, ICA,H+H) - Abnormal; Notable for the following components:   pO2, Arterial 111.0 (*)    Bicarbonate 31.9 (*)    TCO2 34 (*)    Acid-Base Excess 6.0 (*)    Sodium 146 (*)    HCT 25.0 (*)    Hemoglobin 8.5 (*)    All other components within normal limits  POCT I-STAT 7, (LYTES, BLD GAS, ICA,H+H) - Abnormal; Notable for the following components:   pH, Arterial 7.642 (*)    pCO2 arterial 25.5 (*)  pO2, Arterial 67.0 (*)    Bicarbonate 28.4 (*)    Acid-Base Excess 6.0 (*)    Sodium 148 (*)    Potassium 3.4 (*)    HCT 22.0 (*)    Hemoglobin 7.5 (*)    All other components within normal limits  SARS CORONAVIRUS 2 (HOSPITAL ORDER, PERFORMED IN [facility])  CULTURE, BLOOD (ROUTINE X 2)  CULTURE, BLOOD (ROUTINE X 2)  URINE CULTURE  CULTURE, RESPIRATORY  LACTIC ACID, PLASMA  LACTIC ACID, PLASMA    MAGNESIUM  MAGNESIUM  I-STAT ARTERIAL BLOOD GAS, ED  I-STAT ARTERIAL BLOOD GAS, ED  TROPONIN I (HIGH SENSITIVITY)  TROPONIN I (HIGH SENSITIVITY)    EKG EKG Interpretation  Date/Time:  Thursday October 03 2018 11:29:51 EDT Ventricular Rate:  63 PR Interval:    QRS Duration: 126 QT Interval:  465 QTC Calculation: 476 R Axis:   -13 Text Interpretation:  Sinus rhythm Prolonged PR interval Probable left ventricular hypertrophy Borderline prolonged QT interval Baseline wander in lead(s) V1 Confirmed by Virgel Manifold [phone]) on 10/03/2018 12:11:18 PM   Radiology Ct Head W & Wo Contrast  Result Date: 10/03/2018 CLINICAL DATA:  Altered level of consciousness. Hypoxia. History of meningioma. EXAM: CT HEAD WITHOUT AND WITH CONTRAST TECHNIQUE: Contiguous axial images were obtained from the base of the skull through the vertex without and with intravenous contrast CONTRAST:  16mL ISOVUE-300 IOPAMIDOL (ISOVUE-300) INJECTION 61% COMPARISON:  MRI 02/19/2017.  CT 02/07/2017. FINDINGS: Brain: No acute finding. Extensive old infarctions in the cerebellum right more than left. Chronic small-vessel ischemic changes of the pons. Widespread chronic small-vessel ischemic change throughout both cerebral hemispheres. Old right occipital infarction. 2.8 cm partially calcified meningioma of the left sphenoid wing as seen previously, indenting the under surface of the brain but without significant mass-effect. No sign of hemorrhage, hydrocephalus or extra-axial collection. Vascular: . Major vessels at the base of the brain show flow. There is atherosclerotic calcification of the major vessels at the base of the brain. Skull: Negative Sinuses/Orbits: Clear/normal Other: None IMPRESSION: No acute finding. Extensive chronic ischemic changes throughout the brain as outlined above. Chronic 2.8 cm partially calcified meningioma of the left sphenoid wing without apparent growth or change. Electronically Signed   By: Nelson Chimes M.D.   On: 10/03/2018 13:44   Dg Chest Port 1 View  Result Date: 10/03/2018 CLINICAL DATA:  Check endotracheal tube placement EXAM: PORTABLE CHEST 1 VIEW COMPARISON:  Film from earlier in the same day. FINDINGS: Endotracheal tube is now seen approximately 3.8 cm above the carina. Gastric catheter extends into the stomach. Increasing density in the right base is noted consistent with a combination of atelectasis and effusion. Vascular congestion is again seen. No pneumothorax is noted. No other focal abnormality is seen. IMPRESSION: Stable edema. Increasing right-sided effusion and basilar atelectasis. Tubes and lines as described in satisfactory position. Electronically Signed   By: Inez Catalina M.D.   On: 10/03/2018 16:42   Dg Chest Portable 1 View  Result Date: 10/03/2018 CLINICAL DATA:  Hypoxemia.  History of hypertension and stroke. EXAM: PORTABLE CHEST 1 VIEW COMPARISON:  10/03/2017 FINDINGS: Cardiac silhouette is mildly enlarged. No mediastinal or hilar masses. There is vascular congestion with hazy bilateral airspace lung opacities, most evident in the bases. There is additional lung base opacity, more evident on the left, likely due to small effusions. No pneumothorax. Skeletal structures are grossly intact. IMPRESSION: Findings consistent with mild congestive heart failure. Electronically Signed   By: Shanon Brow  Ormond M.D.   On: 10/03/2018 12:31   Dg Abd Portable 1 View  Result Date: 10/03/2018 CLINICAL DATA:  Check gastric catheter placement EXAM: PORTABLE ABDOMEN - 1 VIEW COMPARISON:  None. FINDINGS: Gastric catheter is noted within the stomach. Proximal side port lies at the gastroesophageal junction. This could be advanced further into the stomach as necessary. IVC filter is noted in satisfactory position. Scattered large and small bowel gas is noted. IMPRESSION: Gastric catheter as described. This could be advanced further into the stomach. Electronically Signed   By: Inez Catalina M.D.    On: 10/03/2018 16:43    Procedures Procedures (including critical care time)  INTUBATION Performed by: Virgel Manifold  Required items: required blood products, implants, devices, and special equipment available Patient identity confirmed: provided demographic data and hospital-assigned identification number Time out: Immediately prior to procedure a "time out" was called to verify the correct patient, procedure, equipment, support staff and site/side marked as required.  Indications: respiratory failure  Intubation method: Glidescope Laryngoscopy   Preoxygenation: BVM  Sedatives: Etomidate Paralytic: Rocuronium  Tube Size: 8.0 cuffed  Post-procedure assessment: chest rise and ETCO2 monitor Breath sounds: equal and absent over the epigastrium Tube secured with: ETT holder Chest x-ray interpreted by radiologist and me.  Chest x-ray findings: endotracheal tube in appropriate position  Patient tolerated the procedure well with no immediate complications.  CRITICAL CARE Performed by: Virgel Manifold Total critical care time: 80 minutes Critical care time was exclusive of separately billable procedures and treating other patients. Critical care was necessary to treat or prevent imminent or life-threatening deterioration. Critical care was time spent personally by me on the following activities: development of treatment plan with patient and/or surrogate as well as nursing, discussions with consultants, evaluation of patient's response to treatment, examination of patient, obtaining history from patient or surrogate, ordering and performing treatments and interventions, ordering and review of laboratory studies, ordering and review of radiographic studies, pulse oximetry and re-evaluation of patient's condition.   Medications Ordered in ED Medications  vancomycin (VANCOCIN) 1,750 mg in sodium chloride 0.9 % 500 mL IVPB (has no administration in time range)  ceFEPIme (MAXIPIME) 2 g  in sodium chloride 0.9 % 100 mL IVPB (has no administration in time range)  fentaNYL (SUBLIMAZE) injection 50 mcg (has no administration in time range)  vancomycin (VANCOCIN) 1,750 mg in sodium chloride 0.9 % 500 mL IVPB (0 mg Intravenous Stopped 10/03/18 1533)  ceFEPIme (MAXIPIME) 2 g in sodium chloride 0.9 % 100 mL IVPB (0 g Intravenous Stopped 10/03/18 1304)  furosemide (LASIX) injection 40 mg (40 mg Intravenous Given 10/03/18 1317)  iopamidol (ISOVUE-300) 61 % injection 100 mL (100 mLs Intravenous Contrast Given 10/03/18 1302)  etomidate (AMIDATE) injection (30 mg Intravenous Given 10/03/18 1539)  rocuronium (ZEMURON) injection (80 mg Intravenous Given 10/03/18 1539)     Initial Impression / Assessment and Plan / ED Course  I have reviewed the triage vital signs and the nursing notes.  Pertinent labs & imaging results that were available during my care of the patient were reviewed by me and considered in my medical decision making (see chart for details).  85 year old male with confusion hypoxemia.  This appears to be encephalopathy more than anything else.  Question of possible UTI by PCP yesterday.  Started on Keflex but unsure how many doses he may have had at this point.  Will empirically treat with antibiotics at this time.  Check rectal temperature.  He arrived with an O2 sat of 100%  on a nonrebreather.  By the time I left the room he was titrated down to 4 L on nasal cannula and O2 sat remained in the high 90s.  Check CXR, COVID. Does have peripheral edema and restarted on lasix yesterday by PCP. Check BNP. He is not reliably following commands.  Questionable right-sided gaze and moving left-sided extremities more than the right?  Listed hx of prior CVA and I'm not sure what baseline is nor exact timeline when a change may have occurred. CT head.   3:58 PM Pt initially admitted to IM teaching service. Initial ABG with pCO2 in 90s. Placed on BiPAP. Even more somnolent and increasing o2  requirement despite this. Repeat discussion with daughter. Re-iterates that pt is Full Code. Intubated. Discussed with CCM.    [name] (Daughter# [phone]   Final Clinical Impressions(s) / ED Diagnoses   Final diagnoses:  Altered mental status, unspecified altered mental status type  Urinary tract infection with hematuria, site unspecified  Respiratory failure with hypoxia and hypercapnia, unspecified chronicity [facility])    ED Discharge Orders    None       Virgel Manifold, MD 10/03/18 1414    Virgel Manifold, MD 10/04/18 [phone]

## 2018-10-03 NOTE — Progress Notes (Signed)
Pharmacy Antibiotic Note  [name] is a 85 y.o. male with PMH of HTN, diastolic HF, meningoma, CVA, and CKD. He presents to the ED on 10/03/2018 for AMS. Pt was found to be hypoxic with oxygen saturation of 75% on RA. PT was being treated for UTI outpatient with Keflex prior to arrival. Pharmacy has been consulted for vancomycin/cefepime dosing due to concern for sepsis.  Plan: Vancomycin 1750 mg IV X 1 Cefepime 2g IV X 1  Waiting for BMP to result prior to selecting maintenance doses.  F/U on WBC, S.cr, BP, and clinical status of the patient. BCs, Urine analysis, and covid-19 test have been collected.  ADDENDUM: Scr 1.21 on admit.   Plan: Continue Vancomycin 1750 mg IV Q 48 hrs. Goal AUC 400-550. Expected AUC: 501 SCr used: 1.21 Cefepime 2g IV q12h Monitor clinical progress, c/s, renal function F/u de-escalation plan/LOT, vancomycin levels as indicated    Temp (24hrs), Avg:96.3 F (35.7 C), Min:94.5 F (34.7 C), Max:98 F (36.7 C)  Recent Labs  Lab 10/03/18 1136  WBC 8.6  CREATININE 1.21  LATICACIDVEN 0.8    CrCl cannot be calculated (Unknown ideal weight.).    No Known Allergies  Antimicrobials this admission: Vancomycin 07/23 >>  Cefepime 07/23 >>  Microbiology results: 07/23 BCx: collected 07/23 UCx: collected  Elicia Lamp, PharmD, BCPS Please check AMION for all [place] contact numbers Clinical Pharmacist 10/03/2018 12:54 PM

## 2018-10-03 NOTE — ED Notes (Signed)
Successful intubation. Color change. 24 at the lip. Port x ray at on the way

## 2018-10-03 NOTE — ED Triage Notes (Signed)
Pt arrives via EMS for altered mental status, pt found to be hypoxic when EMS arrived. sats 75% on room air. Improved to 100% with nonrebreather. Pt followed commands, but had very mumbled speech. Right sided deficits from prior stroke. Pt lives at home with family. Pt treated for UTI yesterday.

## 2018-10-04 ENCOUNTER — Inpatient Hospital Stay (HOSPITAL_COMMUNITY): Payer: Medicare HMO

## 2018-10-04 DIAGNOSIS — E876 Hypokalemia: Secondary | ICD-10-CM

## 2018-10-04 DIAGNOSIS — N189 Chronic kidney disease, unspecified: Secondary | ICD-10-CM

## 2018-10-04 DIAGNOSIS — R4182 Altered mental status, unspecified: Secondary | ICD-10-CM

## 2018-10-04 DIAGNOSIS — N179 Acute kidney failure, unspecified: Secondary | ICD-10-CM

## 2018-10-04 DIAGNOSIS — J9602 Acute respiratory failure with hypercapnia: Secondary | ICD-10-CM

## 2018-10-04 LAB — POCT I-STAT 7, (LYTES, BLD GAS, ICA,H+H)
Acid-Base Excess: 6 mmol/L — ABNORMAL HIGH (ref 0.0–2.0)
Bicarbonate: 28.4 mmol/L — ABNORMAL HIGH (ref 20.0–28.0)
Calcium, Ion: 1.27 mmol/L (ref 1.15–1.40)
HCT: 22 % — ABNORMAL LOW (ref 39.0–52.0)
Hemoglobin: 7.5 g/dL — ABNORMAL LOW (ref 13.0–17.0)
O2 Saturation: 98 %
Patient temperature: 91.1
Potassium: 3.4 mmol/L — ABNORMAL LOW (ref 3.5–5.1)
Sodium: 148 mmol/L — ABNORMAL HIGH (ref 135–145)
TCO2: 29 mmol/L (ref 22–32)
pCO2 arterial: 25.5 mmHg — ABNORMAL LOW (ref 32.0–48.0)
pH, Arterial: 7.642 (ref 7.350–7.450)
pO2, Arterial: 67 mmHg — ABNORMAL LOW (ref 83.0–108.0)

## 2018-10-04 LAB — BASIC METABOLIC PANEL
Anion gap: 11 (ref 5–15)
Anion gap: 7 (ref 5–15)
BUN: 21 mg/dL (ref 8–23)
BUN: 24 mg/dL — ABNORMAL HIGH (ref 8–23)
CO2: 27 mmol/L (ref 22–32)
CO2: 28 mmol/L (ref 22–32)
Calcium: 9.3 mg/dL (ref 8.9–10.3)
Calcium: 9.2 mg/dL (ref 8.9–10.3)
Chloride: 109 mmol/L (ref 98–111)
Chloride: 113 mmol/L — ABNORMAL HIGH (ref 98–111)
Creatinine, Ser: 1.52 mg/dL — ABNORMAL HIGH (ref 0.61–1.24)
Creatinine, Ser: 1.8 mg/dL — ABNORMAL HIGH (ref 0.61–1.24)
GFR calc Af Amer: 46 mL/min — ABNORMAL LOW (ref >60)
GFR calc Af Amer: 37 mL/min — ABNORMAL LOW (ref >60)
GFR calc non Af Amer: 39 mL/min — ABNORMAL LOW (ref >60)
GFR calc non Af Amer: 32 mL/min — ABNORMAL LOW (ref >60)
Glucose, Bld: 86 mg/dL (ref 70–99)
Glucose, Bld: 113 mg/dL — ABNORMAL HIGH (ref 70–99)
Potassium: 3.9 mmol/L (ref 3.5–5.1)
Potassium: 4.5 mmol/L (ref 3.5–5.1)
Sodium: 147 mmol/L — ABNORMAL HIGH (ref 135–145)
Sodium: 148 mmol/L — ABNORMAL HIGH (ref 135–145)

## 2018-10-04 LAB — URINE CULTURE: Culture: NO GROWTH

## 2018-10-04 LAB — CBC
HCT: 25.3 % — ABNORMAL LOW (ref 39.0–52.0)
Hemoglobin: 7.4 g/dL — ABNORMAL LOW (ref 13.0–17.0)
MCH: 25.6 pg — ABNORMAL LOW (ref 26.0–34.0)
MCHC: 29.2 g/dL — ABNORMAL LOW (ref 30.0–36.0)
MCV: 87.5 fL (ref 80.0–100.0)
Platelets: 198 10*3/uL (ref 150–400)
RBC: 2.89 MIL/uL — ABNORMAL LOW (ref 4.22–5.81)
RDW: 15.7 % — ABNORMAL HIGH (ref 11.5–15.5)
WBC: 10.4 10*3/uL (ref 4.0–10.5)
nRBC: 0.2 % (ref 0.0–0.2)

## 2018-10-04 LAB — PHOSPHORUS: Phosphorus: 1.5 mg/dL — ABNORMAL LOW (ref 2.5–4.6)

## 2018-10-04 LAB — MAGNESIUM: Magnesium: 1.9 mg/dL (ref 1.7–2.4)

## 2018-10-04 MED ORDER — MAGNESIUM SULFATE 2 GM/50ML IV SOLN
2.0000 g | Freq: Once | INTRAVENOUS | Status: AC
  Administered 2018-10-04: 2 g via INTRAVENOUS
  Filled 2018-10-04: qty 50

## 2018-10-04 MED ORDER — POTASSIUM CHLORIDE 20 MEQ/15ML (10%) PO SOLN
40.0000 meq | Freq: Once | ORAL | Status: AC
  Administered 2018-10-04: 40 meq
  Filled 2018-10-04: qty 30

## 2018-10-04 MED ORDER — FUROSEMIDE 10 MG/ML IJ SOLN
40.0000 mg | Freq: Once | INTRAMUSCULAR | Status: AC
  Administered 2018-10-04: 40 mg via INTRAVENOUS
  Filled 2018-10-04: qty 4

## 2018-10-04 MED ORDER — POTASSIUM PHOSPHATES 15 MMOLE/5ML IV SOLN
30.0000 mmol | Freq: Once | INTRAVENOUS | Status: AC
  Administered 2018-10-04: 30 mmol via INTRAVENOUS
  Filled 2018-10-04: qty 10

## 2018-10-04 MED ORDER — FUROSEMIDE 10 MG/ML IJ SOLN
40.0000 mg | Freq: Three times a day (TID) | INTRAMUSCULAR | Status: AC
  Administered 2018-10-04 – 2018-10-05 (×3): 40 mg via INTRAVENOUS
  Filled 2018-10-04 (×3): qty 4

## 2018-10-04 MED ORDER — MUPIROCIN 2 % EX OINT
1.0000 "application " | TOPICAL_OINTMENT | Freq: Two times a day (BID) | CUTANEOUS | Status: AC
  Administered 2018-10-04 – 2018-10-08 (×10): 1 via NASAL
  Filled 2018-10-04: qty 22

## 2018-10-04 MED ORDER — POTASSIUM CHLORIDE 20 MEQ/15ML (10%) PO SOLN
40.0000 meq | Freq: Once | ORAL | Status: AC
  Administered 2018-10-04: 40 meq via ORAL
  Filled 2018-10-04: qty 30

## 2018-10-04 NOTE — Progress Notes (Signed)
eLink Physician-Brief Progress Note Patient Name: [name] DOB: 1924-08-11 MRN: [id number]   Date of Service  10/04/2018  HPI/Events of Note  Respiratory alkalosis  eICU Interventions  RR reduced from 22 to 18        [name] [name] 10/04/2018, 5:08 AM

## 2018-10-04 NOTE — Progress Notes (Signed)
Initial Nutrition Assessment  INTERVENTION:   If expected to remain intubated beyond 24-48 hours: Initiate Vital HP @ 40 ml/hr with 30 ml Prostat TID. This will provide 1260 kcal, 129g protein and 802 ml H2O.  NUTRITION DIAGNOSIS:   Inadequate oral intake related to inability to eat as evidenced by NPO status.  GOAL:   Provide needs based on ASPEN/SCCM guidelines  MONITOR:   Vent status, Labs, Weight trends, I & O's  REASON FOR ASSESSMENT:   Ventilator    ASSESSMENT:   85 year old with CVA, meningioma, hypertension, hyperlipidemia, DVT presenting with altered mental status.  Failed BiPAP in the ED and intubatedHas history of frequent UTIs and was recently started on Keflex as an outpatient.  Has stopped taking Lasix for 1 month due to incontinence  **RD working remotely**  Patient is currently intubated on ventilator support MV: 9.1 L/min Temp (24hrs), Avg:92 F (33.3 C), Min:89.8 F (32.1 C), Max:100.9 F (38.3 C)  Tube feeding recommendations provided above in case pt cannot be extubated. Per weight records, pt with increasing weights. Suspect this is related to fluid as pt has not been taking prescribed Lasix this past month. Pt with severe LE edema per nursing documentation. Used admission weight of 99 kg for estimated needs below. UOP x 24 hrs: 2L  Medications: IV Mg sulfate, IV K Phos   Labs reviewed: Elevated Na Low K, Phos Mg WNL  NUTRITION - FOCUSED PHYSICAL EXAM:  Unable to perform -working remotely.  Diet Order:   Diet Order            Diet NPO time specified  Diet effective now              EDUCATION NEEDS:   Not appropriate for education at this time  Skin:  Skin Assessment: Reviewed RN Assessment  Last BM:  PTA  Height:   Ht Readings from Last 1 Encounters:  10/03/18 5\' 7"  (1.702 m)    Weight:   Wt Readings from Last 1 Encounters:  10/04/18 108.1 kg    Ideal Body Weight:  67.2 kg  BMI:  Body mass index is 37.33  kg/m.  Estimated Nutritional Needs:   Kcal:  7824-2353  Protein:  134g  Fluid:  1.5L/day  Clayton Bibles, MS, RD, LDN [place] Dietitian Pager: [phone] After Hours Pager: [phone]

## 2018-10-04 NOTE — Progress Notes (Addendum)
NAME:  [name], MRN:  [id number], DOB:  1924-10-02, LOS: 1 ADMISSION DATE:  10/03/2018, CONSULTATION DATE:  10/03/18 REFERRING MD:  Wilson Singer  CHIEF COMPLAINT:  AMS   Brief History   [name] is a 85 y.o. male who was admitted 7/23 with AMS felt to be due to UTI and hypercapnic / hypoxic respiratory failure.  Required intubation after failing BiPAP.  History of present illness   Pt is encephelopathic; therefore, this HPI is obtained from chart review. [name] is a 85 y.o. male who has a PMH including but not limited to CVA, meningioma, HTN, HLD, DVT, anemia.  He presented to [facility] ED 7/23 with AMS.  On EMS arrival, he had sats 75% which improved to 100% on NRB.  ABG demonstrated pCO2 in the 90s.  He was placed on BiPAP but failed and had increasing O2 requirements; therefore, required intubation.  UA also suggestive of UTI.  He had been seen by PCP 7/22 for leg swelling, dyspnea, AMS.  Found to be hypoxic.  Symptoms felt to be due to fluid overload 2/2 pt not taking lasix for about a month (stopped due to incontinence).  Also started on keflex x 5 days for possible UTI (per daughter, pt has frequent UTI's with similar presentation of AMS).  Past Medical History  CVA 2013, meningioma, HTN, HLD, DVT 2015, BPH, anemia.  Significant Hospital Events   7/23 > admit.  Consults:  PCCM.  Procedures:  ETT 7/23 >   Significant Diagnostic Tests:  CXR 7/23 > edema. CT head 7/23 > no acute findings, chronic ischemic changes, chronic 2.8cm partially calcified meningioma of left sphenoid wing. CXR 7/24 > worsened edema, bilateral pleural effusions.  Micro Data:  Blood 7/23 >  Sputum 7/23 >  Urine 7/23 >  SARS CoV2 7/23 > negative.  Antimicrobials:  Vanc 7/23 >  Cefepime 7/23 >    Interim history/subjective:  Comfortable.  Follows basic commands. Did not tolerate PSV wean due to low effort (though had received 47mcg fentanyl a few minutes prior to trial).  Objective:   Blood pressure (!) 115/58, pulse 73, temperature (!) 91.7 F (33.2 C), resp. rate 18, height 5\' 7"  (1.702 m), weight 108.1 kg, SpO2 100 %.    Vent Mode: PRVC FiO2 (%):  [50 %-100 %] 70 % Set Rate:  [16 bmp-22 bmp] 18 bmp Vt Set:  [520 mL] 520 mL PEEP:  [5 cmH20] 5 cmH20 Plateau Pressure:  [18 cmH20-19 cmH20] 18 cmH20   Intake/Output Summary (Last 24 hours) at 10/04/2018 0840 Last data filed at 10/04/2018 0300 Gross per 24 hour  Intake -  Output 2000 ml  Net -2000 ml   Filed Weights   10/03/18 1201 10/04/18 0500  Weight: 99 kg 108.1 kg    Examination: General: Elderly male, chronically ill appearing, in NAD. Neuro: Opens eyes and follows basic commands. HEENT: [place]/AT. Sclerae anicteric.  ETT in place.  Hard of hearing. Cardiovascular: RRR, no M/R/G.  Lungs: Respirations even and unlabored.  Faint crackles in bases.  Abdomen: BS x 4, soft, NT/ND.  Musculoskeletal: No gross deformities,1+ edema.  Skin: Skin changes noted to bilateral LE's.  Skin warm, no rashes.  Assessment & Plan:   Acute hypoxic and hypercapnic respiratory failure - s/p intubation after failing BiPAP. Greatly improved with mechanical ventilation. - Full vent support (failed PSV wean this AM due to low effort / MV; though, did receive 60mcg fentanyl just prior to trial). - Repeat SBT later this  morning. - Bronchial hygiene. - Follow CXR.  Acute pulmonary edema - s/p 40mg  lasix. Echo from Aug 2019 with EF 65 - 70%, G1DD). Bilateral effusions. - Additional 40mg  lasix now. - Follow I/O's and CXR.  - Will perform pleural POCUS to assess whether would benefit from thoracentesis.  Altered Mental Status - 2/2 above + UTI.  Improving. - Continue vent support as above and push for extubation if tolerates SBT. - Empiric vanc / cefepime for now and narrow as able.  Anemia of chronic disease. - Transfuse for Hgb < 7.  Hx HTN, HLD. - Hold preadmission amlodipine, atorvastatin, furosemide, toprol-xl.   Hypokalemia. Hypophosphatemia. Hypomagnesemia. - 40 mEq K. - 5mmol K phos. - 2g Mag. - Repeat BMP at noon.  Best Practice:  Diet: NPO. Pain/Anxiety/Delirium protocol (if indicated): Fentanyl PRN / Midazolam PRN.  RASS goal 0. VAP protocol (if indicated): In place. DVT prophylaxis: SCD's / Heparin. GI prophylaxis: PPI. Glucose control: N/A. Mobility: Bedrest. Code Status: DNR. Family Communication: Updated daughter [name] over the phone 7/23 on admission who states to continue full supportive care; however, in the event of arrest, to not perform CPR / ACLS. Disposition: ICU.   Critical care time: 30 min.    Montey Hora, [facility] Pager: [phone].  If no answer, [phone] 10/04/2018, 8:40 AM    PCCM:  This is a 85 year old gentleman presented with concern of urinary tract infection, hypoxemic, hypercarbic was on a nonrebreather ultimately failed BiPAP requiring intubation and mechanical ventilation.  Initial chest x-ray imaging revealed bilateral pulmonary edema bilateral effusion.  Echo with grade 1 diastolic dysfunction.  Patient remains critically ill intubated mechanical life support.  BP 119/71   Pulse 83   Temp (!) 100.9 F (38.3 C) (Core (Comment))   Resp 16   Ht 5\' 7"  (1.702 m)   Wt 108.1 kg   SpO2 100%   BMI 37.33 kg/m   General: Elderly male, intubated, on mechanical life support. Neck: Endotracheal tube in place Heart: Regular rate rhythm, S1-S2 Lungs: Bilateral ventilated breath sounds Abdomen: Soft nontender nondistended Extremities: No significant edema  Arterial blood gas alkalotic, vent changes made Sodium 148 creatinine 1.8, baseline 1.25 MRSA PCR positive  Chest x-ray: Bilateral airspace disease, bilateral pulmonary edema, left pleural effusion Possible infiltrate within the right upper lobe  Echocardiogram normal ejection fraction grade 1 diastolic function  Assessment: Acute  hypoxemic respiratory failure requiring intubation mechanical ventilation.  Evidence of bilateral pulmonary edema bilateral pleural effusions AKI on CKD Acute on chronic diastolic heart failure Volume overload Possible, UTI, pneumonia, infiltrate within the right upper lobe, question of right upper lobe collapse  Plan: Patient remains intensive care unit on close hemodynamic respiratory support Continue patient on full mechanical life support at this time. Agree with DNR status it was discussed with patient's family yesterday. Continue attempts at fluid removal with ongoing diuresis. Replacement of potassium with diuresis. Continue antibiotics at this time Continue vancomycin and cefepime, MRSA PCR positive De-escalate once cultures speciate.  This patient is critically ill with multiple organ system failure; which, requires frequent high complexity decision making, assessment, support, evaluation, and titration of therapies. This was completed through the application of advanced monitoring technologies and extensive interpretation of multiple databases. During this encounter critical care time was devoted to patient care services described in this note for 32 minutes.   Garner Nash, DO [facility] 10/04/2018 1:32 PM  Personal pager: #  [phone] If unanswered, please page CCM On-call: [phone]

## 2018-10-04 NOTE — Progress Notes (Signed)
Internal Medicine Clinic Attending  I saw and evaluated the patient.  I personally confirmed the key portions of the history and exam documented by Dr. Darrick Meigs and I reviewed pertinent patient test results.  The assessment, diagnosis, and plan were formulated together and I agree with the documentation in the resident's note.    As noted we suspect a UTI and Rx a course of keflex, additionally it seems lasix which he was taking 3 times weekly has fallen off his medication list and he is no longer taking, he is clearly volume up and had some O2 desaturation.  He unfortunately did not do well with supplemental O2 so in discussion with rochelle, we opted to no Rx supplemental O2 again and will try home diuresis, unfortunately it appears he worsened overnight and has now presented to the ED and has been admitted for additional diuresis.

## 2018-10-04 NOTE — Progress Notes (Signed)
RT obtained ABG on pt with the following results. RT reported critical pH to [facility] MD Ogan who adjusted pt rate from 22 to 18 per ABG results. RT will make change and get a gas in one hour per MD. RT will continue to monitor.   Results for CROSBY, ORIORDAN (MRN [id number]) as of 10/04/2018 05:04  Ref. Range 10/04/2018 04:47  Sample type Unknown ARTERIAL  pH, Arterial Latest Ref Range: 7.350 - 7.450  7.642 (HH)  pCO2 arterial Latest Ref Range: 32.0 - 48.0 mmHg 25.5 (L)  pO2, Arterial Latest Ref Range: 83.0 - 108.0 mmHg 67.0 (L)  TCO2 Latest Ref Range: 22 - 32 mmol/L 29  Acid-Base Excess Latest Ref Range: 0.0 - 2.0 mmol/L 6.0 (H)  Bicarbonate Latest Ref Range: 20.0 - 28.0 mmol/L 28.4 (H)  O2 Saturation Latest Units: % 98.0  Patient temperature Unknown 91.1 F  Collection site Unknown RADIAL, ALLEN'S TEST ACCEPTABLE

## 2018-10-05 ENCOUNTER — Inpatient Hospital Stay (HOSPITAL_COMMUNITY): Payer: Medicare HMO

## 2018-10-05 DIAGNOSIS — Z9911 Dependence on respirator [ventilator] status: Secondary | ICD-10-CM

## 2018-10-05 DIAGNOSIS — G9341 Metabolic encephalopathy: Secondary | ICD-10-CM

## 2018-10-05 DIAGNOSIS — E875 Hyperkalemia: Secondary | ICD-10-CM

## 2018-10-05 LAB — BASIC METABOLIC PANEL
Anion gap: 12 (ref 5–15)
Anion gap: 13 (ref 5–15)
BUN: 29 mg/dL — ABNORMAL HIGH (ref 8–23)
BUN: 33 mg/dL — ABNORMAL HIGH (ref 8–23)
CO2: 24 mmol/L (ref 22–32)
CO2: 24 mmol/L (ref 22–32)
Calcium: 9 mg/dL (ref 8.9–10.3)
Calcium: 9 mg/dL (ref 8.9–10.3)
Chloride: 109 mmol/L (ref 98–111)
Chloride: 111 mmol/L (ref 98–111)
Creatinine, Ser: 2.14 mg/dL — ABNORMAL HIGH (ref 0.61–1.24)
Creatinine, Ser: 2.2 mg/dL — ABNORMAL HIGH (ref 0.61–1.24)
GFR calc Af Amer: 30 mL/min — ABNORMAL LOW (ref >60)
GFR calc Af Amer: 29 mL/min — ABNORMAL LOW (ref >60)
GFR calc non Af Amer: 26 mL/min — ABNORMAL LOW (ref >60)
GFR calc non Af Amer: 25 mL/min — ABNORMAL LOW (ref >60)
Glucose, Bld: 90 mg/dL (ref 70–99)
Glucose, Bld: 108 mg/dL — ABNORMAL HIGH (ref 70–99)
Potassium: 6.2 mmol/L — ABNORMAL HIGH (ref 3.5–5.1)
Potassium: 3.6 mmol/L (ref 3.5–5.1)
Sodium: 145 mmol/L (ref 135–145)
Sodium: 148 mmol/L — ABNORMAL HIGH (ref 135–145)

## 2018-10-05 LAB — CBC
HCT: 25.5 % — ABNORMAL LOW (ref 39.0–52.0)
Hemoglobin: 7.7 g/dL — ABNORMAL LOW (ref 13.0–17.0)
MCH: 25.6 pg — ABNORMAL LOW (ref 26.0–34.0)
MCHC: 30.2 g/dL (ref 30.0–36.0)
MCV: 84.7 fL (ref 80.0–100.0)
Platelets: 197 10*3/uL (ref 150–400)
RBC: 3.01 MIL/uL — ABNORMAL LOW (ref 4.22–5.81)
RDW: 16.3 % — ABNORMAL HIGH (ref 11.5–15.5)
WBC: 10.3 10*3/uL (ref 4.0–10.5)
nRBC: 0 % (ref 0.0–0.2)

## 2018-10-05 LAB — PHOSPHORUS: Phosphorus: 2.6 mg/dL (ref 2.5–4.6)

## 2018-10-05 LAB — MAGNESIUM: Magnesium: 2.2 mg/dL (ref 1.7–2.4)

## 2018-10-05 MED ORDER — FREE WATER
200.0000 mL | Freq: Three times a day (TID) | Status: DC
  Administered 2018-10-05 – 2018-10-06 (×3): 200 mL

## 2018-10-05 MED ORDER — SODIUM ZIRCONIUM CYCLOSILICATE 5 G PO PACK
5.0000 g | PACK | Freq: Once | ORAL | Status: AC
  Administered 2018-10-05: 5 g via ORAL
  Filled 2018-10-05: qty 1

## 2018-10-05 MED ORDER — VITAL HIGH PROTEIN PO LIQD
1000.0000 mL | ORAL | Status: DC
  Administered 2018-10-05: 1000 mL

## 2018-10-05 MED ORDER — PRO-STAT SUGAR FREE PO LIQD
30.0000 mL | Freq: Three times a day (TID) | ORAL | Status: DC
  Administered 2018-10-05 (×3): 30 mL
  Filled 2018-10-05 (×3): qty 30

## 2018-10-05 NOTE — Progress Notes (Signed)
NAME:  [name], MRN:  [id number], DOB:  09-11-1924, LOS: 2 ADMISSION DATE:  10/03/2018, CONSULTATION DATE:  10/03/18 REFERRING MD:  Wilson Singer  CHIEF COMPLAINT:  AMS   Brief History   [name] is a 85 y.o. male who was admitted 7/23 with AMS felt to be due to UTI and hypercapnic / hypoxic respiratory failure.  Required intubation after failing BiPAP.  History of present illness   Pt is encephelopathic; therefore, this HPI is obtained from chart review. [name] is a 85 y.o. male who has a PMH including but not limited to CVA, meningioma, HTN, HLD, DVT, anemia.  He presented to [facility] ED 7/23 with AMS.  On EMS arrival, he had sats 75% which improved to 100% on NRB.  ABG demonstrated pCO2 in the 90s.  He was placed on BiPAP but failed and had increasing O2 requirements; therefore, required intubation.  UA also suggestive of UTI.  He had been seen by PCP 7/22 for leg swelling, dyspnea, AMS.  Found to be hypoxic.  Symptoms felt to be due to fluid overload 2/2 pt not taking lasix for about a month (stopped due to incontinence).  Also started on keflex x 5 days for possible UTI (per daughter, pt has frequent UTI's with similar presentation of AMS).  Past Medical History  CVA 2013, meningioma, HTN, HLD, DVT 2015, BPH, anemia.  Significant Hospital Events   7/23 > admit.  Consults:  PCCM.  Procedures:  ETT 7/23 >   Significant Diagnostic Tests:  CXR 7/23 > edema. CT head 7/23 > no acute findings, chronic ischemic changes, chronic 2.8cm partially calcified meningioma of left sphenoid wing. CXR 7/24 > worsened edema, bilateral pleural effusions.  Micro Data:  Blood 7/23 >  Sputum 7/23 >  Urine 7/23 >  SARS CoV2 7/23 > negative.  Antimicrobials:  Vanc 7/23 > 10/05/2018 Cefepime 7/23 > 10/05/2018  Interim history/subjective:  Failed SBT this morning.  Is hard of hearing but will follow some basic commands, when stimulated flails his arms up in the air.  Did have good urine  output with diuresis.  Objective:  Blood pressure 125/77, pulse 60, temperature 99.5 F (37.5 C), temperature source Bladder, resp. rate 18, height 5\' 7"  (1.702 m), weight 108.1 kg, SpO2 100 %.    Vent Mode: PRVC FiO2 (%):  [40 %] 40 % Set Rate:  [18 bmp] 18 bmp Vt Set:  [50 mL-520 mL] 50 mL PEEP:  [5 cmH20] 5 cmH20 Pressure Support:  [15 cmH20] 15 cmH20 Plateau Pressure:  [17 cmH20-30 cmH20] 17 cmH20   Intake/Output Summary (Last 24 hours) at 10/05/2018 1011 Last data filed at 10/05/2018 0600 Gross per 24 hour  Intake 370 ml  Output 3830 ml  Net -3460 ml   Filed Weights   10/03/18 1201 10/04/18 0500  Weight: 99 kg 108.1 kg    Examination: General: Elderly male, intubated, on mechanical ventilation critically ill Neuro: Opens eyes, follows basic commands, moves all 4 extremities HEENT: NCAT, endotracheal tube in place, patient is definitely hard of hearing Cardiovascular: Regular rate and rhythm, S1-S2, no RG Lungs: Lateral ventilated breath sounds Abdomen: Bowel sounds present, soft nontender nondistended Musculoskeletal: No significant edema Skin: No rash   Assessment & Plan:   Acute hypoxic and hypercapnic respiratory failure - s/p intubation after failing BiPAP.  -Patient remains on full mechanical ventilatory support. -He has failed his SBT this morning.  He becomes apneic and pressure support. We will continue to hold sedating medications  to see if this improves throughout the day. -Consider liberation from ventilator later this afternoon.  Or early mid morning.  Acute pulmonary edema - s/p 40mg  lasix. Echo from Aug 2019 with EF 65 - 70%, G1DD). Bilateral effusions. -Holding additional doses of Lasix at this time. -Follow I's and O's -Repeat chest x-ray with improving pleural effusion.  AKI on CKD -We will continue to observe Hold additional diuretics at this time  Altered Mental Status  -Likely secondary to above. -There was concern for possible sepsis  from urinary tract infection however at this time we will withhold antibiotics all cultures are no growth to date.  Anemia of chronic disease. -Conservative transfusion threshold goal hemoglobin greater than 7  Hx HTN, HLD. - H holding preadmission amlodipine, atorvastatin - Holding any additional doses of Lasix at this time. - Holding beta-blocker, current heart rate 60s.  Hyperkalemia One-time dose Lokelma  Hypophosphatemia. Hypomagnesemia. -Replete as needed to maintain potassium greater than 4, magnesium greater than 2  Best Practice:  Diet: NPO. Pain/Anxiety/Delirium protocol (if indicated): Fentanyl PRN / Midazolam PRN.  RASS goal 0. VAP protocol (if indicated): In place. DVT prophylaxis: SCD's / Heparin. GI prophylaxis: PPI. Glucose control: N/A. Mobility: Bedrest. Code Status: DNR. Family Communication: Updated daughter [name] over the phone 7/23 on admission who states to continue full supportive care; however, in the event of arrest, to not perform CPR / ACLS. Disposition: ICU.  This patient is critically ill with multiple organ system failure; which, requires frequent high complexity decision making, assessment, support, evaluation, and titration of therapies. This was completed through the application of advanced monitoring technologies and extensive interpretation of multiple databases. During this encounter critical care time was devoted to patient care services described in this note for 32 minutes.   Garner Nash, DO [facility] 10/05/2018 10:18 AM  Personal pager: [phone] If unanswered, please page CCM On-call: [phone]

## 2018-10-06 MED ORDER — ORAL CARE MOUTH RINSE
15.0000 mL | Freq: Two times a day (BID) | OROMUCOSAL | Status: DC
  Administered 2018-10-06 – 2018-10-07 (×4): 15 mL via OROMUCOSAL

## 2018-10-06 MED ORDER — CHLORHEXIDINE GLUCONATE 0.12 % MT SOLN
15.0000 mL | Freq: Two times a day (BID) | OROMUCOSAL | Status: DC
  Administered 2018-10-06 – 2018-10-07 (×2): 15 mL via OROMUCOSAL

## 2018-10-06 NOTE — Procedures (Signed)
Extubation Procedure Note  Patient Details:   Name: [name] DOB: 10-Nov-1924 MRN: [id number]   Airway Documentation:  Airway 8 mm (Active)  Secured at (cm) 25 cm 10/06/18 0806  Measured From Lips 10/06/18 0806  Secured Location Right 10/06/18 0806  Secured By [employer] 10/06/18 0806  Tube Holder Repositioned Yes 10/06/18 0806  Cuff Pressure (cm H2O) 30 cm H2O 10/05/18 1939  Site Condition Cool;Dry 10/06/18 0806   Vent end date: (not recorded) Vent end time: (not recorded)   Evaluation  O2 sats: stable throughout Complications: No apparent complications Patient did tolerate procedure well. Bilateral Breath Sounds: Clear, Diminished   Yes  No stridor noted.   Akemi Overholser 10/06/2018, 11:49 AM

## 2018-10-06 NOTE — Progress Notes (Signed)
NAME:  [name], MRN:  [id number], DOB:  1924/12/26, LOS: 3 ADMISSION DATE:  10/03/2018, CONSULTATION DATE:  10/03/18 REFERRING MD:  Wilson Singer  CHIEF COMPLAINT:  AMS   Brief History   [name] is a 85 y.o. male who was admitted 7/23 with AMS felt to be due to UTI and hypercapnic / hypoxic respiratory failure.  Required intubation after failing BiPAP.  History of present illness   Pt is encephelopathic; therefore, this HPI is obtained from chart review. [name] is a 85 y.o. male who has a PMH including but not limited to CVA, meningioma, HTN, HLD, DVT, anemia.  He presented to [facility] ED 7/23 with AMS.  On EMS arrival, he had sats 75% which improved to 100% on NRB.  ABG demonstrated pCO2 in the 90s.  He was placed on BiPAP but failed and had increasing O2 requirements; therefore, required intubation.  UA also suggestive of UTI.  He had been seen by PCP 7/22 for leg swelling, dyspnea, AMS.  Found to be hypoxic.  Symptoms felt to be due to fluid overload 2/2 pt not taking lasix for about a month (stopped due to incontinence).  Also started on keflex x 5 days for possible UTI (per daughter, pt has frequent UTI's with similar presentation of AMS).  Past Medical History  CVA 2013, meningioma, HTN, HLD, DVT 2015, BPH, anemia.  Significant Hospital Events   7/23 > admit.  Consults:  PCCM.  Procedures:  ETT 7/23 >   Significant Diagnostic Tests:  CXR 7/23 > edema. CT head 7/23 > no acute findings, chronic ischemic changes, chronic 2.8cm partially calcified meningioma of left sphenoid wing. CXR 7/24 > worsened edema, bilateral pleural effusions.  Micro Data:  Blood 7/23 >  Sputum 7/23 >  Urine 7/23 >  SARS CoV2 7/23 > negative.  Antimicrobials:  Vanc 7/23 > 10/05/2018 Cefepime 7/23 > 10/05/2018  Interim history/subjective:  Tolerating SBT this morning.  Still with good urine output.  Following commands.  Remains intubated on mechanical life support.  Objective:  Blood  pressure 134/67, pulse 76, temperature 98.8 F (37.1 C), temperature source Bladder, resp. rate 13, height 5\' 7"  (1.702 m), weight 99.7 kg, SpO2 100 %.    Vent Mode: PSV;CPAP FiO2 (%):  [40 %] 40 % Set Rate:  [18 bmp] 18 bmp Vt Set:  [520 mL] 520 mL PEEP:  [5 cmH20] 5 cmH20 Pressure Support:  [8 cmH20] 8 cmH20 Plateau Pressure:  [16 cmH20-17 cmH20] 16 cmH20   Intake/Output Summary (Last 24 hours) at 10/06/2018 1104 Last data filed at 10/06/2018 0800 Gross per 24 hour  Intake 840 ml  Output 1600 ml  Net -760 ml   Filed Weights   10/03/18 1201 10/04/18 0500 10/06/18 0500  Weight: 99 kg 108.1 kg 99.7 kg    Examination: General: Elderly male, intubated on mechanical ventilation critically ill Neuro: Opens eyes to commands, follows commands, moves all 4 extremities able to nod head yes and no.  He is very hard of hearing HEENT: NCAT, endotracheal tube in place Cardiovascular: Regular rate and rhythm, S1-S2 Lungs: Bilateral ventilated breath sounds Abdomen: Bowel sounds present present, soft nontender nondistended Musculoskeletal: No significant edema Skin: No rash   Assessment & Plan:   Acute hypoxic and hypercapnic respiratory failure - s/p intubation after failing BiPAP.  -Patient remains on full mechanical ventilatory support at this time. -Tolerating SBT this morning. - We will make plans for liberation from mechanical ventilator. -Continues her in the ICU  post extubation.  Acute pulmonary edema - s/p 40mg  lasix. Echo from Aug 2019 with EF 65 - 70%, G1DD). Bilateral effusions. -Holding additional doses of Lasix at that time Follow I's and O's Repeat chest x-ray with improving pleural effusions -Continue to observe  AKI on CKD Continue to observe. -Continue to follow urine output -Avoid nephrotoxic agents  Altered Mental Status  -Altered mental status likely secondary above. -Holding additional antimicrobials  Anemia of chronic disease. -Continue conservative  blood transfusion, hemoglobin greater than 7  Hx HTN, HLD. -Continue holding preadmission amlodipine, atorvastatin, no additional Lasix at this time, holding beta-blocker due to heart rate in the 60s.  Hyperkalemia One-time dose Lokelma  Hypophosphatemia. Hypomagnesemia. Replete as needed to maintain potassium greater than 4, magnesium greater than 2  Best Practice:  Diet: NPO. Pain/Anxiety/Delirium protocol (if indicated): Fentanyl PRN / Midazolam PRN.  RASS goal 0. VAP protocol (if indicated): In place. DVT prophylaxis: SCD's / Heparin. GI prophylaxis: PPI. Glucose control: N/A. Mobility: Bedrest. Code Status: DNR. Family Communication: Updated daughter [name] over the phone 7/23 on admission who states to continue full supportive care; however, in the event of arrest, to not perform CPR / ACLS. Disposition: ICU.  This patient is critically ill with multiple organ system failure; which, requires frequent high complexity decision making, assessment, support, evaluation, and titration of therapies. This was completed through the application of advanced monitoring technologies and extensive interpretation of multiple databases. During this encounter critical care time was devoted to patient care services described in this note for 32 minutes.   Garner Nash, DO [facility] 10/06/2018 11:04 AM  Personal pager: [phone] If unanswered, please page CCM On-call: [phone]

## 2018-10-07 DIAGNOSIS — J9601 Acute respiratory failure with hypoxia: Principal | ICD-10-CM

## 2018-10-07 LAB — CBC
HCT: 28 % — ABNORMAL LOW (ref 39.0–52.0)
Hemoglobin: 8.1 g/dL — ABNORMAL LOW (ref 13.0–17.0)
MCH: 25.6 pg — ABNORMAL LOW (ref 26.0–34.0)
MCHC: 28.9 g/dL — ABNORMAL LOW (ref 30.0–36.0)
MCV: 88.6 fL (ref 80.0–100.0)
Platelets: 206 10*3/uL (ref 150–400)
RBC: 3.16 MIL/uL — ABNORMAL LOW (ref 4.22–5.81)
RDW: 16.1 % — ABNORMAL HIGH (ref 11.5–15.5)
WBC: 7.2 10*3/uL (ref 4.0–10.5)
nRBC: 0 % (ref 0.0–0.2)

## 2018-10-07 LAB — RENAL FUNCTION PANEL
Albumin: 2.8 g/dL — ABNORMAL LOW (ref 3.5–5.0)
Anion gap: 10 (ref 5–15)
BUN: 35 mg/dL — ABNORMAL HIGH (ref 8–23)
CO2: 32 mmol/L (ref 22–32)
Calcium: 9.5 mg/dL (ref 8.9–10.3)
Chloride: 113 mmol/L — ABNORMAL HIGH (ref 98–111)
Creatinine, Ser: 1.96 mg/dL — ABNORMAL HIGH (ref 0.61–1.24)
GFR calc Af Amer: 34 mL/min — ABNORMAL LOW (ref >60)
GFR calc non Af Amer: 29 mL/min — ABNORMAL LOW (ref >60)
Glucose, Bld: 99 mg/dL (ref 70–99)
Phosphorus: 4.2 mg/dL (ref 2.5–4.6)
Potassium: 4.4 mmol/L (ref 3.5–5.1)
Sodium: 155 mmol/L — ABNORMAL HIGH (ref 135–145)

## 2018-10-07 LAB — MAGNESIUM: Magnesium: 2.5 mg/dL — ABNORMAL HIGH (ref 1.7–2.4)

## 2018-10-07 MED ORDER — FUROSEMIDE 40 MG PO TABS: 40.0000 mg | ORAL_TABLET | Freq: Every day | ORAL | Status: DC | PRN

## 2018-10-07 MED ORDER — ALBUTEROL SULFATE (2.5 MG/3ML) 0.083% IN NEBU: 2.5000 mg | INHALATION_SOLUTION | Freq: Four times a day (QID) | RESPIRATORY_TRACT | Status: DC | PRN

## 2018-10-07 MED ORDER — AMLODIPINE BESYLATE 5 MG PO TABS
5.0000 mg | ORAL_TABLET | Freq: Every day | ORAL | Status: DC
  Administered 2018-10-07 – 2018-10-11 (×5): 5 mg via ORAL
  Filled 2018-10-07 (×5): qty 1

## 2018-10-07 MED ORDER — ORAL CARE MOUTH RINSE
15.0000 mL | Freq: Two times a day (BID) | OROMUCOSAL | Status: DC
  Administered 2018-10-08 – 2018-10-11 (×6): 15 mL via OROMUCOSAL

## 2018-10-07 MED ORDER — ATORVASTATIN CALCIUM 10 MG PO TABS
20.0000 mg | ORAL_TABLET | Freq: Every day | ORAL | Status: DC
  Administered 2018-10-07 – 2018-10-11 (×4): 20 mg via ORAL
  Filled 2018-10-07 (×4): qty 2

## 2018-10-07 NOTE — Progress Notes (Signed)
NAME:  [name], MRN:  [id number], DOB:  Sep 11, 1924, LOS: 4 ADMISSION DATE:  10/03/2018, CONSULTATION DATE:  10/03/18 REFERRING MD:  Wilson Singer  CHIEF COMPLAINT:  AMS   Brief History   [name] is a 85 y.o. male who was admitted 7/23 with AMS felt to be due to UTI and hypercapnic / hypoxic respiratory failure.  Required intubation after failing BiPAP.  History of present illness   Pt is encephelopathic; therefore, this HPI is obtained from chart review. [name] is a 85 y.o. male who has a PMH including but not limited to CVA, meningioma, HTN, HLD, DVT, anemia.  He presented to [facility] ED 7/23 with AMS.  On EMS arrival, he had sats 75% which improved to 100% on NRB.  ABG demonstrated pCO2 in the 90s.  He was placed on BiPAP but failed and had increasing O2 requirements; therefore, required intubation.  UA also suggestive of UTI.  He had been seen by PCP 7/22 for leg swelling, dyspnea, AMS.  Found to be hypoxic.  Symptoms felt to be due to fluid overload 2/2 pt not taking lasix for about a month (stopped due to incontinence).  Also started on keflex x 5 days for possible UTI (per daughter, pt has frequent UTI's with similar presentation of AMS).  Lives at with daughter, [name].  Able feed and dress himself.  Uses walker and wheelchair at home.   Past Medical History  CVA 2013, meningioma, HTN, HLD, DVT 2015, BPH, anemia.  Significant Hospital Events   7/23 > admit 7/26 > extubated  Consults:  PCCM.  Procedures:  ETT 7/23 >   Significant Diagnostic Tests:  CXR 7/23 > edema. CT head 7/23 > no acute findings, chronic ischemic changes, chronic 2.8cm partially calcified meningioma of left sphenoid wing. CXR 7/24 > worsened edema, bilateral pleural effusions.  Micro Data:  Blood 7/23 >  ngtd x 4 Sputum 7/23 >  Urine 7/23 > negative SARS CoV2 7/23 > negative.  Antimicrobials:  Vanc 7/23 > 10/05/2018 Cefepime 7/23 > 10/05/2018  Interim history/subjective:  No events  overnight Remains on 3L  Afebrile No am labs   Objective:  Blood pressure 98/64, pulse 77, temperature 97.7 F (36.5 C), resp. rate 18, height 5\' 7"  (1.702 m), weight 99.7 kg, SpO2 97 %.        Intake/Output Summary (Last 24 hours) at 10/07/2018 1118 Last data filed at 10/07/2018 0500 Gross per 24 hour  Intake -  Output 975 ml  Net -975 ml   Filed Weights   10/03/18 1201 10/04/18 0500 10/06/18 0500  Weight: 99 kg 108.1 kg 99.7 kg    Examination: General:  Elderly male sitting upright in bed in NAD HEENT: MM pink/moist, pupil Neuro: Alert, verbalizes name, MAE- very hard of hearing- difficult to get patient to follow commands  CV: rr, NSR, no mumur PULM: even/non-labored, lungs bilaterally exp wheeze JQ:BHAL, non-tender, bs active, foley present Extremities: warm/dry, no LE edema  Skin: no rashes  Assessment & Plan:   Acute hypoxic and hypercapnic respiratory failure - failed bipap, extubated 7/26 - aggressive pulm hygiene- PT/OT, IS - SLP pending, remains NPO- no hx dysphagia - wean supplemental O2 for goal >92% - albuterol prn   Acute pulmonary edema - resolved. Echo from Aug 2019 with EF 65 - 70%, G1DD). Bilateral effusions-  Improving  - neg 895 ml, net -7.2 L - trend I/O   AKI on CKD - d/c foley, monitor for retention - BMP today  Altered Mental Status  Extremely hard of hearing limits neuro exam - Altered mental status likely secondary above. -Holding additional antimicrobials, following culture data, monitor clinically    Anemia of chronic disease. - Continue conservative blood transfusion, hemoglobin greater than 7 - CBC today    Hx HTN, HLD. - resume home norvasc, toprol, and lipitor when able to take PRN - holding home lasix till BMP, currently negative  I/O balance    Hyperkalemia Hypophosphatemia. Hypomagnesemia. - renal/ mag now - goal K > 4; Mag > 2   Best Practice:  Diet: NPO pending SLP Pain/Anxiety/Delirium protocol (if  indicated): n/a VAP protocol (if indicated): n/a DVT prophylaxis: SCD's / Heparin. GI prophylaxis: PPI d/c Glucose control: N/A. Mobility: PT/OT Code Status: DNR Family Communication: Updated daughter [name] 7/27 by phone Disposition: transfer to Nelson Chimes, MSN, AGACNP-BC [place] Pgr: [phone] or if no answer [phone] 10/07/2018, 11:18 AM

## 2018-10-07 NOTE — Evaluation (Signed)
Clinical/Bedside Swallow Evaluation Patient Details  Name: [name] MRN: [id number] Date of Birth: 1924/06/23  Today's Date: 10/07/2018 Time: SLP Start Time (ACUTE ONLY): 1206 SLP Stop Time (ACUTE ONLY): 1230 SLP Time Calculation (min) (ACUTE ONLY): 24 min  Past Medical History:  Past Medical History:  Diagnosis Date  . Anemia   . BPH (benign prostatic hypertrophy)    TURP 05/19/13  . Cerebral embolism with cerebral infarction ([place]) 12/19/2013  . Chronic kidney disease    CHRONIC KIDNEY DISEASE, 2  . Difficulty hearing, right    BILATERAL HEARING LOSS - BEST TO TRY TO SPEAK INTO LEFT EAR  . DVT (deep venous thrombosis) ([place])   . Frequent falls   . History of DVT (deep vein thrombosis) 06/10/2013   Provoked s/p TURP. Dx per doppler 06/10/13. Complicated by hematuria while on anticoagulation s/p TURP. Initially on lovenox and coumadin, then stopped, then IVC filter placed 06/16/13. Anticoagulation restarted after hematuria stopped. Lovenox was discontinued apparently on 07/29/13 with comment on high risk for falls.   Total ~1.5 months of anticoagulation but interrupted with bleeding complication.  . Hyperlipidemia   . Hypertension   . Incontinence of urine    SOME INCONTINENCE  . Left adrenal mass ([place]) 09/11/2013  . Meningioma ([place])   . Stroke ([place])    Cerebellar, 2013; WALKS WITH WALKER, ABLE TO DRESS AND BATHE HIMSELF BUT FAMILY TRIES TO PROVIDE SUPERVISION BECAUSE OF HIS HX OF FALL AND WEAKNESS LEGS, ARMS   . Thrombocytopenia ([place])   . Vitreous hemorrhage ([place]) 12/21/2013   OS 12/18/13 CT /MRI brain done for ataxia    Past Surgical History:  Past Surgical History:  Procedure Laterality Date  . CYSTOSCOPY N/A 06/13/2013   Procedure: CYSTOSCOPY FLEXIBLE BEDSIDE;  Surgeon: Ardis Hughs, MD;  Location: [place];  Service: Urology;  Laterality: N/A;  . MYRINGOTOMY WITH TUBE PLACEMENT Bilateral   . TRANSURETHRAL RESECTION OF PROSTATE N/A 05/19/2013   Procedure: TRANSURETHRAL RESECTION  OF THE PROSTATE WITH GYRUS INSTRUMENTS;  Surgeon: Ailene Rud, MD;  Location: [facility];  Service: Urology;  Laterality: N/A;   HPI:  [name] is a 85 y.o. male who was admitted 7/23 with AMS felt to be due to UTI and hypercapnic / hypoxic respiratory failure. Intubated 7/23-7/26. PMH: CVA (2013), meningioma, HTN, HLD, DVT, anemia. CT head no acute findings, chronic ischemic changes, chronic 2.8cm partially calcified meningioma of left sphenoid wing. CXR Pulmonary vascular congestion and mild bibasilar atelectasis again noted. Probable small effusions again identified and may be decreased.   Assessment / Plan / Recommendation Clinical Impression  Pt exhibiting a suspected oral dysphagia primarily with larger liquid volumes and duel consistencies. Immediate cough x 2 when consuming thin liquid after solid trial or when bolus still in oral cavity. No cough with sequential cup sips or straw sips until end of eval with straw (? secondary to straw or prior suspected penetration/aspiration event). Upper denture plate not present likley contributing to slower mastication. Pt should sit upright with meals, take small sips via cup, no straws and avoid having solids and liquids in mouth simultaneously. Given history of prior stroke and age, transient penetration/aspiration intermittently not unlikely. Recommend Dys 1 (puree), thin, no straws, crush pills and will need feeding assist and supervision for small sips. ST will follow while here.     SLP Visit Diagnosis: Dysphagia, unspecified (R13.10)    Aspiration Risk  Moderate aspiration risk    Diet Recommendation Dysphagia 1 (Puree);Thin liquid  Liquid Administration via: Cup;No straw Medication Administration: Crushed with puree Supervision: Full supervision/cueing for compensatory strategies;Patient able to self feed;Staff to assist with self feeding Compensations: Minimize environmental distractions;Slow rate;Small sips/bites Postural  Changes: Seated upright at 90 degrees    Other  Recommendations Oral Care Recommendations: Oral care BID   Follow up Recommendations        Frequency and Duration min 2x/week  2 weeks       Prognosis Barriers to Reach Goals: Cognitive deficits      Swallow Study   General HPI: [name] is a 85 y.o. male who was admitted 7/23 with AMS felt to be due to UTI and hypercapnic / hypoxic respiratory failure. Intubated 7/23-7/26. PMH: CVA (2013), meningioma, HTN, HLD, DVT, anemia. CT head no acute findings, chronic ischemic changes, chronic 2.8cm partially calcified meningioma of left sphenoid wing. CXR Pulmonary vascular congestion and mild bibasilar atelectasis again noted. Probable small effusions again identified and may be decreased. Type of Study: Bedside Swallow Evaluation Previous Swallow Assessment: (none) Diet Prior to this Study: NPO Temperature Spikes Noted: No Respiratory Status: Nasal cannula History of Recent Intubation: Yes Length of Intubations (days): 4 days Date extubated: 10/06/18 Behavior/Cognition: Alert;Cooperative;Pleasant mood;Requires cueing(HOH) Oral Cavity Assessment: Within Functional Limits Oral Care Completed by SLP: Yes Oral Cavity - Dentition: Dentures, bottom;Other (Comment)(lower partial, upper plate not present) Vision: Functional for self-feeding Self-Feeding Abilities: Needs assist;Needs set up Patient Positioning: Upright in bed Baseline Vocal Quality: Normal Volitional Cough: Strong Volitional Swallow: Able to elicit    Oral/Motor/Sensory Function Overall Oral Motor/Sensory Function: Within functional limits   Ice Chips Ice chips: Not tested   Thin Liquid Thin Liquid: Impaired Presentation: Cup;Straw Oral Phase Functional Implications: Other (comment)(anterior spill) Pharyngeal  Phase Impairments: Cough - Immediate    Nectar Thick Nectar Thick Liquid: Not tested   Honey Thick Honey Thick Liquid: Not tested   Puree Puree: Within  functional limits   Solid     Solid: Impaired Oral Phase Impairments: Impaired mastication Pharyngeal Phase Impairments: (none)      Bekah Igoe, [name] 10/07/2018,2:05 PM   [name] [place].Ed Risk analyst [phone] Office [phone]

## 2018-10-08 ENCOUNTER — Inpatient Hospital Stay (HOSPITAL_COMMUNITY): Payer: Medicare HMO

## 2018-10-08 DIAGNOSIS — I361 Nonrheumatic tricuspid (valve) insufficiency: Secondary | ICD-10-CM

## 2018-10-08 LAB — BASIC METABOLIC PANEL
Anion gap: 9 (ref 5–15)
Anion gap: 8 (ref 5–15)
BUN: 38 mg/dL — ABNORMAL HIGH (ref 8–23)
BUN: 37 mg/dL — ABNORMAL HIGH (ref 8–23)
CO2: 32 mmol/L (ref 22–32)
CO2: 31 mmol/L (ref 22–32)
Calcium: 9.2 mg/dL (ref 8.9–10.3)
Calcium: 9.2 mg/dL (ref 8.9–10.3)
Chloride: 109 mmol/L (ref 98–111)
Chloride: 109 mmol/L (ref 98–111)
Creatinine, Ser: 1.84 mg/dL — ABNORMAL HIGH (ref 0.61–1.24)
Creatinine, Ser: 1.75 mg/dL — ABNORMAL HIGH (ref 0.61–1.24)
GFR calc Af Amer: 36 mL/min — ABNORMAL LOW (ref >60)
GFR calc Af Amer: 39 mL/min — ABNORMAL LOW (ref >60)
GFR calc non Af Amer: 31 mL/min — ABNORMAL LOW (ref >60)
GFR calc non Af Amer: 33 mL/min — ABNORMAL LOW (ref >60)
Glucose, Bld: 111 mg/dL — ABNORMAL HIGH (ref 70–99)
Glucose, Bld: 128 mg/dL — ABNORMAL HIGH (ref 70–99)
Potassium: 4.1 mmol/L (ref 3.5–5.1)
Potassium: 3.9 mmol/L (ref 3.5–5.1)
Sodium: 150 mmol/L — ABNORMAL HIGH (ref 135–145)
Sodium: 148 mmol/L — ABNORMAL HIGH (ref 135–145)

## 2018-10-08 LAB — ECHOCARDIOGRAM COMPLETE
Height: 67 in
Weight: 3350.99 oz

## 2018-10-08 LAB — CBC
HCT: 26.6 % — ABNORMAL LOW (ref 39.0–52.0)
Hemoglobin: 7.7 g/dL — ABNORMAL LOW (ref 13.0–17.0)
MCH: 25.6 pg — ABNORMAL LOW (ref 26.0–34.0)
MCHC: 28.9 g/dL — ABNORMAL LOW (ref 30.0–36.0)
MCV: 88.4 fL (ref 80.0–100.0)
Platelets: 185 10*3/uL (ref 150–400)
RBC: 3.01 MIL/uL — ABNORMAL LOW (ref 4.22–5.81)
RDW: 16.2 % — ABNORMAL HIGH (ref 11.5–15.5)
WBC: 8.1 10*3/uL (ref 4.0–10.5)
nRBC: 0 % (ref 0.0–0.2)

## 2018-10-08 LAB — CULTURE, BLOOD (ROUTINE X 2)
Culture: NO GROWTH
Culture: NO GROWTH

## 2018-10-08 MED ORDER — DEXTROSE 5 % IV SOLN
INTRAVENOUS | Status: DC
  Administered 2018-10-08: 18:00:00 via INTRAVENOUS

## 2018-10-08 NOTE — Progress Notes (Signed)
Occupational Therapy Evaluation Patient Details Name: [name] MRN: [id number] DOB: 10-10-1924 Today's Date: 10/08/2018    History of Present Illness 85 y.o. male who has a PMH including but not limited to CVA, meningioma, HTN, HLD, DVT, anemia.  He presented to [facility] ED 7/23 with AMS.  On EMS arrival, he had sats 75% which improved to 100% on NRB.  ABG demonstrated pCO2 in the 90s.  He was placed on BiPAP but failed and had increasing O2 requirements; therefore, required intubation.  UA also suggestive of UTI. Extubated7/26   Clinical Impression   Called pt's daughter [name] to get PLOF. PTA, pt receiving HHOT/PT, daughter assisted as needed with ADL and pt was able to ambulate with a RW after assistance to stand. Daughter states she will be able to assist as needed after DC. Pt will need to resume HHOT/PT at DC. Pt will also need to go home on O2. At rest on RA - SpO2 82; on 4L SpO2 95. Will follow acutley to facilitate safe DC home.     Follow Up Recommendations  Home health OT;Supervision/Assistance - 24 hour    Equipment Recommendations  None recommended by OT    Recommendations for Other Services       Precautions / Restrictions Precautions Precautions: Fall Precaution Comments: pushes posteriorly      Mobility Bed Mobility Overal bed mobility: Needs Assistance Bed Mobility: Supine to Sit;Sit to Supine     Supine to sit: HOB elevated;Supervision Sit to supine: Supervision      Transfers Overall transfer level: Needs assistance Equipment used: Rolling walker (2 wheeled) Transfers: Sit to/from [name] Sit to Stand: Mod assist;+2 physical assistance Stand pivot transfers: Mod assist       General transfer comment: pushes posteriorly; once moving, able to maintain balance with minguard A    Balance Overall balance assessment: Needs assistance   Sitting balance-Leahy Scale: Good       Standing balance-Leahy Scale: Poor                              ADL either performed or assessed with clinical judgement   ADL Overall ADL's : Needs assistance/impaired Eating/Feeding: Set up;Supervision/ safety   Grooming: Standing;Minimal assistance   Upper Body Bathing: Minimal assistance;Sitting   Lower Body Bathing: Moderate assistance;Sit to/from stand   Upper Body Dressing : Moderate assistance;Sitting   Lower Body Dressing: Moderate assistance;Sit to/from stand   Toilet Transfer: Moderate assistance;+2 for physical assistance;RW   Toileting- Clothing Manipulation and Hygiene: Moderate assistance Toileting - Clothing Manipulation Details (indicate cue type and reason): incontinenet of BM at baseline     Functional mobility during ADLs: +2 for safety/equipment;Moderate assistance General ADL Comments: Initially Mod A +2 due to posterior lean; once up, pt able to ambulate with min A     Vision Baseline Vision/History: Wears glasses Additional Comments: Unable to read written bold large print. daughter states he is unable to read without his glasses     Perception     Praxis      Pertinent Vitals/Pain Pain Assessment: Faces Faces Pain Scale: No hurt     Hand Dominance Left   Extremity/Trunk Assessment Upper Extremity Assessment Upper Extremity Assessment: RUE deficits/detail RUE Deficits / Details: residual R hemiparesis; uses as functional assist; impaired tone RUE Coordination: decreased gross motor;decreased fine motor   Lower Extremity Assessment Lower Extremity Assessment: Defer to PT evaluation   Cervical / Trunk Assessment  Cervical / Trunk Assessment: Other exceptions(kyphotic; forward head)   Communication Communication Communication: HOH;Expressive difficulties   Cognition Arousal/Alertness: Awake/alert Behavior During Therapy: WFL for tasks assessed/performed Overall Cognitive Status: Difficult to assess                                 General Comments: most  likely close to baseline; able to follow gestural cues  Encouraged daughter to primarily use the wc for mobility until HHOT/PT can assist with mobility to reduce risk of falls.    General Comments       Exercises Exercises: Other exercises Other Exercises Other Exercises: chair marches x 20 Other Exercises: LAQ x 20   Shoulder Instructions      Home Living Family/patient expects to be discharged to:: Private residence Living Arrangements: Children Available Help at Discharge: Family;Available 24 hours/day Type of Home: House Home Access: Ramped entrance     Home Layout: One level     Bathroom Shower/Tub: Tub/shower unit;Curtain   Biochemist, clinical: Standard Bathroom Accessibility: Yes How Accessible: Accessible via walker Home Equipment: [place] - 2 wheels;Bedside commode;Shower seat;Wheelchair - manual;Hospital bed          Prior Functioning/Environment Level of Independence: Needs assistance  Gait / Transfers Assistance Needed: sometimes needed A with sit - stand ADL's / Homemaking Assistance Needed: could feed himself; A with bathing dressing Communication / Swallowing Assistance Needed: HOH, dysarthric  Comments: Difficulty with vision at baseline        OT Problem List: Decreased strength;Decreased activity tolerance;Decreased range of motion;Impaired balance (sitting and/or standing);Impaired vision/perception;Decreased coordination;Decreased cognition;Decreased safety awareness;Impaired tone;Impaired UE functional use      OT Treatment/Interventions: Self-care/ADL training;Therapeutic exercise;Neuromuscular education;DME and/or AE instruction;Therapeutic activities;Visual/perceptual remediation/compensation;Patient/family education;Balance training    OT Goals(Current goals can be found in the care plan section) Acute Rehab OT Goals Patient Stated Goal: daughter is for pt to return home OT Goal Formulation: With patient/family Time For Goal Achievement:  10/22/18 Potential to Achieve Goals: Good  OT Frequency: Min 2X/week   Barriers to D/C:            Co-evaluation PT/OT/SLP Co-Evaluation/Treatment: Yes Reason for Co-Treatment: For patient/therapist safety;To address functional/ADL transfers   OT goals addressed during session: ADL's and self-care;Strengthening/ROM      AM-PAC OT "6 Clicks" Daily Activity     Outcome Measure Help from another person eating meals?: A Little Help from another person taking care of personal grooming?: A Little Help from another person toileting, which includes using toliet, bedpan, or urinal?: A Lot Help from another person bathing (including washing, rinsing, drying)?: A Lot Help from another person to put on and taking off regular upper body clothing?: A Little Help from another person to put on and taking off regular lower body clothing?: A Lot 6 Click Score: 15   End of Session Equipment Utilized During Treatment: Gait belt;Rolling walker;Oxygen(4L) Nurse Communication: Mobility status;Other (comment)(pt able to feed self with set up/S)  Activity Tolerance: Patient tolerated treatment well Patient left: in bed;with call bell/phone within reach;with bed alarm set(unable to leave in chair due to no chair alarm)  OT Visit Diagnosis: Other abnormalities of gait and mobility (R26.89);Muscle weakness (generalized) (M62.81);Low vision, both eyes (H54.2);Cognitive communication deficit (R41.841);Hemiplegia and hemiparesis Symptoms and signs involving cognitive functions: (previous CVA) Hemiplegia - Right/Left: Right Hemiplegia - dominant/non-dominant: Dominant Hemiplegia - caused by: Cerebral infarction  Time: 3532-9924 OT Time Calculation (min): 39 min Charges:  OT General Charges $OT Visit: 1 Visit OT Evaluation $OT Eval Moderate Complexity: 1 Mod OT Treatments $Self Care/Home Management : 8-22 mins  Maurie Boettcher, OT/L   Acute OT Clinical Specialist Acute Rehabilitation  Services Pager [phone] Office [phone]   [facility] 10/08/2018, 9:49 AM

## 2018-10-08 NOTE — Progress Notes (Signed)
SLP Cancellation Note  Patient Details Name: [name] MRN: [id number] DOB: 07-21-1924   Cancelled treatment:       Reason Eval/Treat Not Completed: (pt working with PT and OT)  Will continue efforts.  Luanna Salk, MS [facility] SLP Acute Rehab Services Pager [phone] Office [phone]    Macario Golds 10/08/2018, 9:52 AM

## 2018-10-08 NOTE — Progress Notes (Signed)
  Echocardiogram 2D Echocardiogram has been performed.  Jennette Dubin 10/08/2018, 11:51 AM

## 2018-10-08 NOTE — Care Management Important Message (Signed)
Important Message  Patient Details  Name: [name] MRN: [id number] Date of Birth: 02-14-1925   Medicare Important Message Given:  Yes     [name] 10/08/2018, 12:56 PM

## 2018-10-08 NOTE — Progress Notes (Signed)
SLP Cancellation Note  Patient Details Name: [name] MRN: [id number] DOB: June 21, 1924   Cancelled treatment:       Reason Eval/Treat Not Completed: Patient at procedure or test/unavailable. Will attempt next date.   [name] 10/08/2018, 3:04 PM   Sonia Baller, MA, CCC-SLP Speech Therapy [facility] Acute Rehab Pager: [phone]

## 2018-10-08 NOTE — Evaluation (Signed)
Physical Therapy Evaluation Patient Details Name: [name] MRN: [id number] DOB: [birth year] Today's Date: 10/08/2018   History of Present Illness  85 y.o. male who has a PMH including but not limited to CVA, meningioma, HTN, HLD, DVT, anemia.  He presented to [facility] ED 7/23 with AMS.  On EMS arrival, he had sats 75% which improved to 100% on NRB.  ABG demonstrated pCO2 in the 90s.  He was placed on BiPAP but failed and had increasing O2 requirements; therefore, required intubation.  UA also suggestive of UTI. Extubated7/26    Clinical Impression  Patient admitted with the above. Patient very [place] with daughter providing PLOF. Patient today requiring use of 4L O2 at rest and with mobility with SpO2 95%, on RA at rest SpO2 82%. Patient requiring Mod A +2 for functional mobility with noted posterior lean requiring verbal and tactile cueing for upright posturing. Will recommend HHPT and 24/hr supervision at discharge. PT to follow acutely.     Follow Up Recommendations Home health PT;Supervision/Assistance - 24 hour    Equipment Recommendations  None recommended by PT    Recommendations for Other Services       Precautions / Restrictions Precautions Precautions: Fall Precaution Comments: pushes posteriorly Restrictions Weight Bearing Restrictions: No      Mobility  Bed Mobility Overal bed mobility: Needs Assistance Bed Mobility: Supine to Sit;Sit to Supine     Supine to sit: HOB elevated;Supervision Sit to supine: Supervision      Transfers Overall transfer level: Needs assistance Equipment used: Rolling walker (2 wheeled) Transfers: Sit to/from [name] Sit to Stand: Mod assist;+2 physical assistance Stand pivot transfers: Mod assist       General transfer comment: pushes posteriorly; once moving, able to maintain balance with minguard A  Ambulation/Gait Ambulation/Gait assistance: Min assist;Mod assist;+2 physical assistance Gait Distance (Feet):  10 Feet(x2) Assistive device: Rolling walker (2 wheeled) Gait Pattern/deviations: Step-to pattern;Step-through pattern;Decreased stride length;Trunk flexed Gait velocity: decreased   General Gait Details: posterior lean throughout mobility; unsteady gait pattern  Stairs            Wheelchair Mobility    Modified Rankin (Stroke Patients Only)       Balance Overall balance assessment: Needs assistance   Sitting balance-Leahy Scale: Good       Standing balance-Leahy Scale: Poor                               Pertinent Vitals/Pain Pain Assessment: Faces Faces Pain Scale: No hurt    Home Living Family/patient expects to be discharged to:: Private residence Living Arrangements: Children Available Help at Discharge: Family;Available 24 hours/day Type of Home: House Home Access: Ramped entrance     Home Layout: One level Home Equipment: Walker - 2 wheels;Bedside commode;Shower seat;Wheelchair - manual;Hospital bed      Prior Function Level of Independence: Needs assistance   Gait / Transfers Assistance Needed: sometimes needed A with sit - stand  ADL's / Homemaking Assistance Needed: could feed himself; A with bathing dressing  Comments: Difficulty with vision at baseline     Hand Dominance   Dominant Hand: Left    Extremity/Trunk Assessment   Upper Extremity Assessment Upper Extremity Assessment: RUE deficits/detail RUE Deficits / Details: residual R hemiparesis; uses as functional assist; impaired tone RUE Coordination: decreased gross motor;decreased fine motor    Lower Extremity Assessment Lower Extremity Assessment: Generalized weakness;RLE deficits/detail RLE Deficits / Details: with weight  bearing patient with posterior lean RLE Coordination: decreased fine motor;decreased gross motor    Cervical / Trunk Assessment Cervical / Trunk Assessment: Other exceptions(kyphotic; forward head)  Communication   Communication: HOH;Expressive  difficulties  Cognition Arousal/Alertness: Awake/alert Behavior During Therapy: WFL for tasks assessed/performed Overall Cognitive Status: Difficult to assess                                 General Comments: most likely close to baseline; able to follow gestural cues      General Comments      Exercises Other Exercises Other Exercises: chair marches x 20 Other Exercises: LAQ x 20   Assessment/Plan    PT Assessment Patient needs continued PT services  PT Problem List Decreased strength;Decreased activity tolerance;Decreased balance;Decreased mobility;Decreased knowledge of use of DME;Decreased safety awareness       PT Treatment Interventions DME instruction;Gait training;Functional mobility training;Therapeutic activities;Therapeutic exercise;Balance training;Neuromuscular re-education;Patient/family education    PT Goals (Current goals can be found in the Care Plan section)  Acute Rehab PT Goals Patient Stated Goal: daughter is for pt to return home PT Goal Formulation: With patient/family Time For Goal Achievement: 10/22/18 Potential to Achieve Goals: Good    Frequency Min 3X/week   Barriers to discharge        Co-evaluation PT/OT/SLP Co-Evaluation/Treatment: Yes Reason for Co-Treatment: For patient/therapist safety;To address functional/ADL transfers PT goals addressed during session: Mobility/safety with mobility;Balance;Proper use of DME OT goals addressed during session: ADL's and self-care;Strengthening/ROM       AM-PAC PT "6 Clicks" Mobility  Outcome Measure Help needed turning from your back to your side while in a flat bed without using bedrails?: A Little Help needed moving from lying on your back to sitting on the side of a flat bed without using bedrails?: A Little Help needed moving to and from a bed to a chair (including a wheelchair)?: A Lot Help needed standing up from a chair using your arms (e.g., wheelchair or bedside chair)?: A  Lot Help needed to walk in hospital room?: A Lot Help needed climbing 3-5 steps with a railing? : A Lot 6 Click Score: 14    End of Session Equipment Utilized During Treatment: Gait belt;Oxygen Activity Tolerance: Patient tolerated treatment well Patient left: in bed;with call bell/phone within reach;with bed alarm set Nurse Communication: Mobility status PT Visit Diagnosis: Unsteadiness on feet (R26.81);Other abnormalities of gait and mobility (R26.89);Muscle weakness (generalized) (M62.81)    Time: 7494-4967 PT Time Calculation (min) (ACUTE ONLY): 39 min   Charges:   PT Evaluation $PT Eval Moderate Complexity: 1 Mod          Lanney Gins, PT, DPT Supplemental Physical Therapist 10/08/18 10:05 AM Pager: [phone] Office: [phone]

## 2018-10-08 NOTE — Progress Notes (Addendum)
Transfer Summary:  [name] is a 85 year old gentleman with CVA, meningioma, HTN, HLD, DVT, anemia who was admitted on 723 due to acute encephalopathy and shortness of breath.  CT head on admission did not show any acute intracranial abnormalities there was a chronic 2.8 cm calcified meningioma seen in the left sphenoid wing.  The patient was under critical care service from 7/23-7/27 due to acute hypoxic and hypercapnic respiratory failure requiring intubation after failing BiPAP.  Chest x-ray on admission showed bilateral pleural effusions. Patient got a total of 3 doses of iv lasix 40mg  on 7/24 & 7/25. He was was extubated on 7/26 and has been stable since.   Prior to admission, patient's daughter had stopped the patient's Lasix approximately 1 month ago due to the patient having urinary incontinence.  The patient was also started on Keflex for UTI outpatient.  Subjective:   [name] was seen laying in his bed this morning doing well. He is hard of hearing and he was not able to hear what the provider was asking.   Objective:  Vital signs in last 24 hours: Vitals:   10/07/18 2331 10/08/18 0421 10/08/18 0500 10/08/18 0735  BP: 130/79 133/77  134/84  Pulse: 76 70  73  Resp: 17 18    Temp: 98.7 F (37.1 C) 98 F (36.7 C)  98.5 F (36.9 C)  TempSrc: Oral Oral  Oral  SpO2: 98% 99%  95%  Weight:   95 kg   Height:       Physical Exam  Constitutional: He appears well-developed and well-nourished. No distress.  HENT:  Head: Normocephalic and atraumatic.  Eyes: Conjunctivae are normal.  Cardiovascular: Normal rate, regular rhythm and normal heart sounds.  Respiratory: Effort normal and breath sounds normal. No respiratory distress. He has no wheezes.  GI: Soft. Bowel sounds are normal. He exhibits no distension. There is no abdominal tenderness.  Musculoskeletal:        General: No edema.  Neurological: He is alert.  Skin: He is not diaphoretic. No erythema.  Psychiatric:  He has a normal mood and affect. His behavior is normal. Judgment and thought content normal.   Assessment/Plan:  Acute hypoxic and hypercapnic respiratory failure secondary to acute pulmonary edema Patient was extubated on 7/26.  His last chest x-ray done on 10/05/2018 showed decreasing pleural effusions with otherwise unchanged appearance of the chest.  The patient currently weighs 95kg down from 99kg on admission. Review of patient's weights in EMR shows that the patient's weight usually is around 95kg.  Last echo was done in August 2018 which showed EF 65-70%, G1DD.  -Repeat TTE to evaluate for structural changes and changes in EF -Monitor I/O -Repeat chest xray -Continue incentive spirometry -SLP evaluation recommended dysphagia 1 diet  Acute on chronic kidney disease (CKD 3) The patient's creatinine is 1.84 which is down from 1.96 yesterday.  Baseline appears to range between 1.2-1.3.  Patient has put out 300 mL over the past 24 hours  -Monitor I/Os  Hypernatremia The patient sodium is 150 today which is down from 155 yesterday.   -D5W 154ml for 10hrs -bmp q8hrs   Acute encephalopathy Meningioma Cerebellar ataxia Altered mental status was thought to be due to decreased hearing and possibly as a result of sepsis from uti.  Patient on vancomycin and cefepime from 7/23-7/25.  Anemia of chronic disease Patient's hemoglobin has been stable at 7.7 today.  We will continue to monitor for transfusion needs and order iron studies.   -  iron studies pending   Hypertension Patient's blood pressure over the past 24 hours has ranged 112-130s/70s-80s. Pulse has ranged 70-80s.   -Continue amlodipine 5 mg daily -Resumed home metoprolol 50mg  qd  Hyperlipidemia  -Continue Lipitor 20 mg daily  Hyperkalemia Hypophosphatemia Hypomagnesemia Potassium 4.4.    -Ordered magnesium and phosphorus.   Dispo: Anticipated discharge in approximately 1-2 day(s).   Lars Mage, MD  10/08/2018, 9:42 AM Pager: [phone]

## 2018-10-09 DIAGNOSIS — J810 Acute pulmonary edema: Secondary | ICD-10-CM

## 2018-10-09 DIAGNOSIS — H9190 Unspecified hearing loss, unspecified ear: Secondary | ICD-10-CM

## 2018-10-09 DIAGNOSIS — I5032 Chronic diastolic (congestive) heart failure: Secondary | ICD-10-CM

## 2018-10-09 DIAGNOSIS — E870 Hyperosmolality and hypernatremia: Secondary | ICD-10-CM

## 2018-10-09 DIAGNOSIS — J9600 Acute respiratory failure, unspecified whether with hypoxia or hypercapnia: Secondary | ICD-10-CM

## 2018-10-09 DIAGNOSIS — N183 Chronic kidney disease, stage 3 (moderate): Secondary | ICD-10-CM

## 2018-10-09 LAB — FERRITIN: Ferritin: 16 ng/mL — ABNORMAL LOW (ref 24–336)

## 2018-10-09 LAB — CBC WITH DIFFERENTIAL/PLATELET
Abs Immature Granulocytes: 0.04 10*3/uL (ref 0.00–0.07)
Basophils Absolute: 0 10*3/uL (ref 0.0–0.1)
Basophils Relative: 0 %
Eosinophils Absolute: 0.3 10*3/uL (ref 0.0–0.5)
Eosinophils Relative: 4 %
HCT: 25.4 % — ABNORMAL LOW (ref 39.0–52.0)
Hemoglobin: 7.2 g/dL — ABNORMAL LOW (ref 13.0–17.0)
Immature Granulocytes: 1 %
Lymphocytes Relative: 20 %
Lymphs Abs: 1.4 10*3/uL (ref 0.7–4.0)
MCH: 25.2 pg — ABNORMAL LOW (ref 26.0–34.0)
MCHC: 28.3 g/dL — ABNORMAL LOW (ref 30.0–36.0)
MCV: 88.8 fL (ref 80.0–100.0)
Monocytes Absolute: 0.6 10*3/uL (ref 0.1–1.0)
Monocytes Relative: 9 %
Neutro Abs: 4.6 10*3/uL (ref 1.7–7.7)
Neutrophils Relative %: 66 %
Platelets: 177 10*3/uL (ref 150–400)
RBC: 2.86 MIL/uL — ABNORMAL LOW (ref 4.22–5.81)
RDW: 16.1 % — ABNORMAL HIGH (ref 11.5–15.5)
WBC: 6.9 10*3/uL (ref 4.0–10.5)
nRBC: 0 % (ref 0.0–0.2)

## 2018-10-09 LAB — BASIC METABOLIC PANEL
Anion gap: 7 (ref 5–15)
Anion gap: 9 (ref 5–15)
Anion gap: 6 (ref 5–15)
Anion gap: 6 (ref 5–15)
BUN: 38 mg/dL — ABNORMAL HIGH (ref 8–23)
BUN: 35 mg/dL — ABNORMAL HIGH (ref 8–23)
BUN: 36 mg/dL — ABNORMAL HIGH (ref 8–23)
BUN: 38 mg/dL — ABNORMAL HIGH (ref 8–23)
CO2: 31 mmol/L (ref 22–32)
CO2: 32 mmol/L (ref 22–32)
CO2: 31 mmol/L (ref 22–32)
CO2: 33 mmol/L — ABNORMAL HIGH (ref 22–32)
Calcium: 8.8 mg/dL — ABNORMAL LOW (ref 8.9–10.3)
Calcium: 8.9 mg/dL (ref 8.9–10.3)
Calcium: 9.1 mg/dL (ref 8.9–10.3)
Calcium: 9.3 mg/dL (ref 8.9–10.3)
Chloride: 106 mmol/L (ref 98–111)
Chloride: 107 mmol/L (ref 98–111)
Chloride: 108 mmol/L (ref 98–111)
Chloride: 106 mmol/L (ref 98–111)
Creatinine, Ser: 1.73 mg/dL — ABNORMAL HIGH (ref 0.61–1.24)
Creatinine, Ser: 1.75 mg/dL — ABNORMAL HIGH (ref 0.61–1.24)
Creatinine, Ser: 1.92 mg/dL — ABNORMAL HIGH (ref 0.61–1.24)
Creatinine, Ser: 1.81 mg/dL — ABNORMAL HIGH (ref 0.61–1.24)
GFR calc Af Amer: 39 mL/min — ABNORMAL LOW (ref >60)
GFR calc Af Amer: 39 mL/min — ABNORMAL LOW (ref >60)
GFR calc Af Amer: 35 mL/min — ABNORMAL LOW (ref >60)
GFR calc Af Amer: 37 mL/min — ABNORMAL LOW (ref >60)
GFR calc non Af Amer: 34 mL/min — ABNORMAL LOW (ref >60)
GFR calc non Af Amer: 33 mL/min — ABNORMAL LOW (ref >60)
GFR calc non Af Amer: 30 mL/min — ABNORMAL LOW (ref >60)
GFR calc non Af Amer: 32 mL/min — ABNORMAL LOW (ref >60)
Glucose, Bld: 118 mg/dL — ABNORMAL HIGH (ref 70–99)
Glucose, Bld: 107 mg/dL — ABNORMAL HIGH (ref 70–99)
Glucose, Bld: 151 mg/dL — ABNORMAL HIGH (ref 70–99)
Glucose, Bld: 110 mg/dL — ABNORMAL HIGH (ref 70–99)
Potassium: 4.2 mmol/L (ref 3.5–5.1)
Potassium: 3.9 mmol/L (ref 3.5–5.1)
Potassium: 3.7 mmol/L (ref 3.5–5.1)
Potassium: 4.2 mmol/L (ref 3.5–5.1)
Sodium: 144 mmol/L (ref 135–145)
Sodium: 148 mmol/L — ABNORMAL HIGH (ref 135–145)
Sodium: 145 mmol/L (ref 135–145)
Sodium: 145 mmol/L (ref 135–145)

## 2018-10-09 LAB — IRON AND TIBC
Iron: 17 ug/dL — ABNORMAL LOW (ref 45–182)
Saturation Ratios: 5 % — ABNORMAL LOW (ref 17.9–39.5)
TIBC: 377 ug/dL (ref 250–450)
UIBC: 360 ug/dL

## 2018-10-09 MED ORDER — SODIUM CHLORIDE 0.9 % IV SOLN
510.0000 mg | Freq: Once | INTRAVENOUS | Status: AC
  Administered 2018-10-09: 510 mg via INTRAVENOUS
  Filled 2018-10-09: qty 17

## 2018-10-09 MED ORDER — DEXTROSE 5 % IV SOLN
INTRAVENOUS | Status: DC
  Administered 2018-10-09: 15:00:00 via INTRAVENOUS

## 2018-10-09 MED ORDER — FUROSEMIDE 10 MG/ML IJ SOLN
40.0000 mg | Freq: Once | INTRAMUSCULAR | Status: AC
  Administered 2018-10-09: 40 mg via INTRAVENOUS
  Filled 2018-10-09: qty 4

## 2018-10-09 MED ORDER — ENOXAPARIN SODIUM 30 MG/0.3ML ~~LOC~~ SOLN
30.0000 mg | SUBCUTANEOUS | Status: DC
  Administered 2018-10-09 – 2018-10-11 (×3): 30 mg via SUBCUTANEOUS
  Filled 2018-10-09 (×3): qty 0.3

## 2018-10-09 NOTE — Progress Notes (Signed)
  Date: 10/09/2018  Patient name: [name]  Medical record number: [id number]  Date of birth: 24-Jan-1925   I have seen and evaluated this patient and I have discussed the plan of care with the house staff. Please see their note for complete details. I concur with their findings with the following additions/corrections: [name] was seen this morning on team rounds.  He is hard of hearing so history was limited.  He does have crackles in the bases bilaterally.  We took off his oxygen and his O2 sat on room air remained between 93 and 97% while lying in bed.  Dry weight in September 2019 was 90.7 kg.  Currently, he is at 94.1 kg.  I agree with Lasix 40 p.o. to treat his pulmonary edema.  His creatinine is 1.75 which has trended down since admission but up from his baseline of 1.2.  If we can get diuresis and he does not desaturate on ambulation, he might be able to be discharged tomorrow to home.   Bartholomew Crews, MD 10/09/2018, 1:06 PM

## 2018-10-09 NOTE — TOC Initial Note (Signed)
Transition of Care [facility]) - Initial/Assessment Note    Patient Details  Name: [name] MRN: [id number] Date of Birth: 10/09/1924  Transition of Care [facility] CM/SW Contact:    Geralynn Ochs, LCSW Phone Number: 10/09/2018, 10:25 AM  Clinical Narrative:  CSW spoke with patient's daughter, [name], over the phone to discuss transition back home. [name] agreeable for patient to return home, hopeful that he can come back home soon. Patient is active with [place] is hopeful that an RN can be added to come out and check on him after he gets out of the hospital. Patient has all equipment needed.                  Expected Discharge Plan: [place] Barriers to Discharge: Continued Medical Work up   Patient Goals and CMS Choice Patient states their goals for this hospitalization and ongoing recovery are:: patient unable to state goals at this time CMS Medicare.gov Compare Post Acute Care list provided to:: Patient Represenative (must comment) Choice offered to / list presented to : Adult Children  Expected Discharge Plan and Services Expected Discharge Plan: [place] Choice: [place] arrangements for the past 2 months: Single Family Home                           HH Arranged: RN, PT, OT, Nurse's Aide [place] Agency: [place] ([facility])        Prior Living Arrangements/Services Living arrangements for the past 2 months: Single Family Home Lives with:: Adult Children Patient language and need for interpreter reviewed:: No Do you feel safe going back to the place where you live?: Yes      Need for Family Participation in Patient Care: Yes (Comment) Care giver support system in place?: Yes (comment) Current home services: DME, Home OT, Home PT Criminal Activity/Legal Involvement Pertinent to Current Situation/Hospitalization: No - Comment as needed  Activities of Daily  Living      Permission Sought/Granted Permission sought to share information with : Facility Sport and exercise psychologist, Family Supports Permission granted to share information with : Yes, Verbal Permission Granted  Share Information with NAME: [name]  Permission granted to share info w AGENCY: [place] granted to share info w Relationship: Daughter     Emotional Assessment   Attitude/Demeanor/Rapport: Unable to Assess Affect (typically observed): Unable to Assess Orientation: : Oriented to Self Alcohol / Substance Use: Not Applicable Psych Involvement: No (comment)  Admission diagnosis:  Endotracheally intubated [Z97.8] Urinary tract infection with hematuria, site unspecified [N39.0, R31.9] Altered mental status, unspecified altered mental status type [R41.82] Respiratory failure with hypoxia and hypercapnia, unspecified chronicity ([place]) [J96.91, J96.92] Patient Active Problem List   Diagnosis Date Noted  . Altered mental status 10/03/2018  . Chronic diastolic (congestive) heart failure ([place]) 10/28/2017  . Renal cyst 10/28/2017  . Shortness of breath on exertion 10/03/2017  . Generalized weakness 02/19/2017  . Cerebellar ataxia in diseases classified elsewhere ([place]) 02/13/2017  . Left ankle swelling 08/03/2016  . Dementia ([place]) 01/28/2016  . Hypernatremia   . UTI (urinary tract infection) 07/17/2014  . Presence of IVC filter 07/17/2014  . Onychomycosis 01/15/2014  . Orthostatic hypotension 07/10/2013  . CKD (chronic kidney disease) stage 3, GFR 30-59 ml/min (HCC) 06/23/2013  . Hyperlipidemia   . Urinary retention 06/15/2013  . Normocytic anemia 06/11/2013  .  Physical deconditioning 05/30/2013  . BPH (benign prostatic hyperplasia) 04/24/2013  . Frequent falls 06/23/2012  . 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 Hearing loss 07/20/2011  . Meningioma ([place]) 07/20/2011  . HTN (hypertension) 07/19/2011  . CVA (cerebral  infarction) 07/19/2011   PCP:  [name], DO Pharmacy:   [facility] [place], [place] [place]  [place] [address] Phone: [phone] Fax: [phone]  [facility] - [place], [place] [address] Phone: [phone] Fax: [phone]     Social Determinants of Health (SDOH) Interventions    Readmission Risk Interventions No flowsheet data found.

## 2018-10-09 NOTE — Progress Notes (Addendum)
   Subjective:   [name] has no acute complaints today. He is able to drink water with small sips during our discussion this morning. He denies any pain at this time.   Objective:  Vital signs in last 24 hours: Vitals:   10/09/18 0306 10/09/18 0850 10/09/18 1200 10/09/18 1232  BP: 109/63 118/70 118/70 120/72  Pulse: 72 79 70 70  Resp: 18 18 18 20   Temp: 97.9 F (36.6 C) 98 F (36.7 C) 98.3 F (36.8 C) 98.2 F (36.8 C)  TempSrc: Oral Oral Oral Oral  SpO2: 97%  95% 94%  Weight: 94.1 kg     Height:       Physical Exam Constitutional:      General: He is not in acute distress.    Appearance: He is obese. He is not ill-appearing, toxic-appearing or diaphoretic.  HENT:     Head: Normocephalic and atraumatic.  Cardiovascular:     Rate and Rhythm: Normal rate and regular rhythm.     Heart sounds: No murmur. No gallop.   Pulmonary:     Effort: Pulmonary effort is normal. No respiratory distress.     Breath sounds: Rales (bilateral, bibasilar) present. No wheezing.  Abdominal:     General: There is no distension.     Palpations: Abdomen is soft.     Tenderness: There is no abdominal tenderness. There is no guarding.  Musculoskeletal:     Right lower leg: No edema.     Left lower leg: No edema.  Skin:    General: Skin is warm and dry.  Neurological:     General: No focal deficit present.     Mental Status: He is alert. Mental status is at baseline.     Assessment/Plan:  Active Problems:   Hypernatremia   Chronic diastolic (congestive) heart failure (HCC)   Altered mental status   #Chronic Diastolic heart failure #Acute Respiratory Failure #Acute Pulmonary Edema Patient continues to have some interstitial edema on Xray taken yesterday and examination is consistent with bibasilar crackles. Echo showed preserved EF of 60-65% with LAE. [name] is down from his weight yesterday by 0.9 kg despite receiving light rehydration for hypernatremia. At this time, we will  restart Lasix 40mg  QD PO for gentle diuresis.   - Lasix 40mg  PO QD - Re-evaluate volume status tomorrow   #Hypernatremia:  Resolved with D5 overnight. Sodium is WNL again at 144. Will continue to monitor but d/c fluids due to CHF.   - BMP tomorrow AM  #CKD3:  Creatinine is stable. Since under 2, subcut. Heparin was switched to Lovenox. Will continue to monitor.     Dispo: Anticipated discharge tomorrow if clinical improvement.  Dr. Jose Persia Internal Medicine PGY-1  Pager: [phone] 10/09/2018, 12:37 PM

## 2018-10-09 NOTE — Progress Notes (Signed)
Student Pharmacist rounding with the IMTS-B1 service observed a patient who was a candidate for medication optimization. The patient is a 85 year old male on day 6 of hospital admission for altered mental status. He was being managed on subcutaneous heparin 5000 units every 8 hours for DVT prophylaxis. I have reviewed the patient's chart and found the creatinine clearance listed as 30.1 mL/min. A recommendation to transition subcutaneous heparin to enoxaparin was made and accepted by the medicine team. He is now on enoxaparin 30mg  once daily for DVT prophylaxis. Continue to monitor platelets and symptoms of bleeding.   Youlanda Roys, 4th year Stage manager

## 2018-10-09 NOTE — Progress Notes (Signed)
  Speech Language Pathology Treatment: Dysphagia  Patient Details Name: [name] MRN: [id number] DOB: [birth year] Today's Date: 10/09/2018 Time: 4235-3614 SLP Time Calculation (min) (ACUTE ONLY): 12 min  Assessment / Plan / Recommendation Clinical Impression  Pt self-fed cup sips of thin liquids with no coughing or throat clearing noted, but he did have frequent eructation that appeared to increase in intensity after also taking boluses of puree. Question a possible esophageal component. RN present and attempted to administer pill whole in puree per SLP suggestion with decreased posterior propulsion noted, requiring additional boluses of puree to try to clear. RN says that although he has some audible rhonchi in his lungs, it has been unchanged before/after meals. Recommend continuing current diet with additional SLP f/u warranted for safety. Will see if solids can also be advanced.   HPI HPI: [name] is a 85 y.o. male who was admitted 7/23 with AMS felt to be due to UTI and hypercapnic / hypoxic respiratory failure. Intubated 7/23-7/26. PMH: CVA (2013), meningioma, HTN, HLD, DVT, anemia. CT head no acute findings, chronic ischemic changes, chronic 2.8cm partially calcified meningioma of left sphenoid wing. CXR Pulmonary vascular congestion and mild bibasilar atelectasis again noted. Probable small effusions again identified and may be decreased.      SLP Plan  Continue with current plan of care       Recommendations  Diet recommendations: Dysphagia 1 (puree);Thin liquid Liquids provided via: Cup;No straw Medication Administration: Crushed with puree Supervision: Patient able to self feed;Full supervision/cueing for compensatory strategies Compensations: Minimize environmental distractions;Slow rate;Small sips/bites Postural Changes and/or Swallow Maneuvers: Seated upright 90 degrees;Upright 30-60 min after meal                Oral Care Recommendations: Oral care  BID Follow up Recommendations: Home health SLP;24 hour supervision/assistance SLP Visit Diagnosis: Dysphagia, unspecified (R13.10) Plan: Continue with current plan of care       [place] [name] 10/09/2018, 10:46 AM  [name], M.A. [place] Acute Environmental education officer [phone] Office [phone]

## 2018-10-09 NOTE — Progress Notes (Signed)
Nutrition Follow-up   RD working remotely.  DOCUMENTATION CODES:   Not applicable  INTERVENTION:  Provide Magic cup TID with meals, each supplement provides 290 kcal and 9 grams of protein  Encourage adequate PO intake.   NUTRITION DIAGNOSIS:   Inadequate oral intake related to inability to eat as evidenced by NPO status.; diet advanced; improving  GOAL:   Patient will meet greater than or equal to 90% of their needs; met  MONITOR:   PO intake, Supplement acceptance, Diet advancement, Skin, Weight trends, Labs, I & O's  REASON FOR ASSESSMENT:   Ventilator    ASSESSMENT:   85 year old with CVA, meningioma, hypertension, hyperlipidemia, DVT presenting with altered mental status.  Failed BiPAP in the ED and intubatedHas history of frequent UTIs and was recently started on Keflex as an outpatient.  Has stopped taking Lasix for 1 month due to incontinence Extubated 7/26.   Pt currently on dysphagia 1 diet with thin liquids. Meal completion has been 50-100%. SLP following for po safety and possible diet advancement. RD to order magic cup at meals to aid in caloric and protein needs. Per MD, pt continues to have interstitial edema related to chronic diastolic heart failure and currently on PO lasix for diuresis. Possible plans for discharge home tomorrow.   Labs and medications reviewed.   Diet Order:   Diet Order            DIET - DYS 1 Room service appropriate? Yes; Fluid consistency: Thin  Diet effective now              EDUCATION NEEDS:   Not appropriate for education at this time  Skin:  Skin Assessment: Reviewed RN Assessment  Last BM:  Unknown  Height:   Ht Readings from Last 1 Encounters:  10/03/18 _0  (1.702 m)    Weight:   Wt Readings from Last 1 Encounters:  10/09/18 94.1 kg    Ideal Body Weight:  67.2 kg  BMI:  Body mass index is 32.49 kg/m.  Estimated Nutritional Needs:   Kcal:  1700-1850  Protein:  80-90 grams  Fluid:  Per  MD    Corrin Parker, MS, RD, LDN Pager # [phone] After hours/ weekend pager # [phone]

## 2018-10-10 LAB — BASIC METABOLIC PANEL
Anion gap: 8 (ref 5–15)
BUN: 38 mg/dL — ABNORMAL HIGH (ref 8–23)
CO2: 31 mmol/L (ref 22–32)
Calcium: 9.2 mg/dL (ref 8.9–10.3)
Chloride: 105 mmol/L (ref 98–111)
Creatinine, Ser: 1.81 mg/dL — ABNORMAL HIGH (ref 0.61–1.24)
GFR calc Af Amer: 37 mL/min — ABNORMAL LOW (ref >60)
GFR calc non Af Amer: 32 mL/min — ABNORMAL LOW (ref >60)
Glucose, Bld: 151 mg/dL — ABNORMAL HIGH (ref 70–99)
Potassium: 3.9 mmol/L (ref 3.5–5.1)
Sodium: 144 mmol/L (ref 135–145)

## 2018-10-10 LAB — CBC WITH DIFFERENTIAL/PLATELET
Abs Immature Granulocytes: 0.14 10*3/uL — ABNORMAL HIGH (ref 0.00–0.07)
Basophils Absolute: 0 10*3/uL (ref 0.0–0.1)
Basophils Relative: 0 %
Eosinophils Absolute: 0.2 10*3/uL (ref 0.0–0.5)
Eosinophils Relative: 3 %
HCT: 26.2 % — ABNORMAL LOW (ref 39.0–52.0)
Hemoglobin: 7.4 g/dL — ABNORMAL LOW (ref 13.0–17.0)
Immature Granulocytes: 2 %
Lymphocytes Relative: 21 %
Lymphs Abs: 1.7 10*3/uL (ref 0.7–4.0)
MCH: 25 pg — ABNORMAL LOW (ref 26.0–34.0)
MCHC: 28.2 g/dL — ABNORMAL LOW (ref 30.0–36.0)
MCV: 88.5 fL (ref 80.0–100.0)
Monocytes Absolute: 0.6 10*3/uL (ref 0.1–1.0)
Monocytes Relative: 7 %
Neutro Abs: 5.4 10*3/uL (ref 1.7–7.7)
Neutrophils Relative %: 67 %
Platelets: 174 10*3/uL (ref 150–400)
RBC: 2.96 MIL/uL — ABNORMAL LOW (ref 4.22–5.81)
RDW: 16.3 % — ABNORMAL HIGH (ref 11.5–15.5)
WBC: 8.1 10*3/uL (ref 4.0–10.5)
nRBC: 0 % (ref 0.0–0.2)

## 2018-10-10 MED ORDER — FUROSEMIDE 10 MG/ML IJ SOLN
80.0000 mg | Freq: Once | INTRAMUSCULAR | Status: AC
  Administered 2018-10-10: 80 mg via INTRAVENOUS
  Filled 2018-10-10: qty 8

## 2018-10-10 NOTE — Progress Notes (Signed)
Physical Therapy Treatment Patient Details Name: [name] MRN: [id number] DOB: 13-Nov-1924 Today's Date: 10/10/2018    History of Present Illness 85 y.o. male who has a PMH including but not limited to CVA, meningioma, HTN, HLD, DVT, anemia.  He presented to [facility] ED 7/23 with AMS.  On EMS arrival, he had sats 75% which improved to 100% on NRB.  ABG demonstrated pCO2 in the 90s.  He was placed on BiPAP but failed and had increasing O2 requirements; therefore, required intubation.  UA also suggestive of UTI. Extubated7/26    PT Comments    Pt performed transfer training to edge of bed and from bed to recliner chair. He required moderate to max assistance to mobilize.  Daughter plans for patient to return home.  Based on this preference will recommend HHPT.     Follow Up Recommendations  Home health PT;Supervision/Assistance - 24 hour     Equipment Recommendations  None recommended by PT    Recommendations for Other Services       Precautions / Restrictions Precautions Precautions: Fall Precaution Comments: Strong R lateral lean. Restrictions Weight Bearing Restrictions: No    Mobility  Bed Mobility Overal bed mobility: Needs Assistance Bed Mobility: Supine to Sit     Supine to sit: HOB elevated;Mod assist     General bed mobility comments: Increased time and effort to scoot to edge of bed.  Pt required moderate assistance to elevate trunk and advance LEs to edge of bed.  Transfers Overall transfer level: Needs assistance Equipment used: Rolling walker (2 wheeled) Transfers: Sit to/from [name] Sit to Stand: Mod assist Stand pivot transfers: Max assist       General transfer comment: Cues for hand placement to and from seated surface.  Pt with R posterolateral lean in standing.  Ambulation/Gait Ambulation/Gait assistance: Max assist Gait Distance (Feet): 4 Feet Assistive device: Rolling walker (2 wheeled) Gait Pattern/deviations: Step-to  pattern;Step-through pattern;Decreased stride length;Trunk flexed Gait velocity: decreased   General Gait Details: Posterolateral lean to R.  Shuffling steps with assistance to maintain stance and elevate trunk into sitting.   Stairs             Wheelchair Mobility    Modified Rankin (Stroke Patients Only)       Balance Overall balance assessment: Needs assistance   Sitting balance-Leahy Scale: Good       Standing balance-Leahy Scale: Poor                              Cognition Arousal/Alertness: Awake/alert Behavior During Therapy: WFL for tasks assessed/performed Overall Cognitive Status: Difficult to assess                                 General Comments: most likely close to baseline; able to follow gestural cues      Exercises      General Comments        Pertinent Vitals/Pain Pain Assessment: No/denies pain Faces Pain Scale: No hurt    Home Living                      Prior Function            PT Goals (current goals can now be found in the care plan section) Acute Rehab PT Goals Patient Stated Goal: daughter is for pt to return home  Potential to Achieve Goals: Fair Progress towards PT goals: Progressing toward goals    Frequency    Min 3X/week      PT Plan Current plan remains appropriate    Co-evaluation              AM-PAC PT "6 Clicks" Mobility   Outcome Measure  Help needed turning from your back to your side while in a flat bed without using bedrails?: A Little Help needed moving from lying on your back to sitting on the side of a flat bed without using bedrails?: A Little Help needed moving to and from a bed to a chair (including a wheelchair)?: A Lot Help needed standing up from a chair using your arms (e.g., wheelchair or bedside chair)?: A Lot Help needed to walk in hospital room?: A Lot Help needed climbing 3-5 steps with a railing? : A Lot 6 Click Score: 14    End of  Session Equipment Utilized During Treatment: Gait belt;Oxygen Activity Tolerance: Patient tolerated treatment well Patient left: in bed;with call bell/phone within reach;with bed alarm set Nurse Communication: Mobility status PT Visit Diagnosis: Unsteadiness on feet (R26.81);Other abnormalities of gait and mobility (R26.89);Muscle weakness (generalized) (M62.81)     Time: 1497-0263 PT Time Calculation (min) (ACUTE ONLY): 32 min  Charges:  $Therapeutic Activity: 23-37 mins                     Governor Rooks, PTA Acute Rehabilitation Services Pager [phone] Office [phone]     Damani Rando Eli Hose 10/10/2018, 1:55 PM

## 2018-10-10 NOTE — Progress Notes (Signed)
   Subjective:   No acute complaints today.  However on examination noted that oxygen is back up.  Patient is not able to verbalize if having shortness of breath due to severe hard of hearing.  Objective:  Vital signs in last 24 hours: Vitals:   10/09/18 2049 10/09/18 2302 10/10/18 0311 10/10/18 0730  BP:  (!) 145/84 133/62 136/69  Pulse: 84 83 85 82  Resp:  18 18 18   Temp:  98.3 F (36.8 C) 97.8 F (36.6 C) 98.1 F (36.7 C)  TempSrc:  Oral Oral Oral  SpO2: 91% 92% 98% 95%  Weight:   95.3 kg   Height:       Physical Exam Vitals signs and nursing note reviewed.  Constitutional:      Appearance: He is obese.  HENT:     Head: Normocephalic and atraumatic.     Mouth/Throat:     Mouth: Mucous membranes are moist.  Cardiovascular:     Rate and Rhythm: Normal rate and regular rhythm.     Heart sounds: Murmur present.  Pulmonary:     Effort: Pulmonary effort is normal. No respiratory distress.     Breath sounds: Wheezing and rales present. No rhonchi.  Abdominal:     General: Bowel sounds are normal. There is no distension.     Palpations: Abdomen is soft.     Tenderness: There is no abdominal tenderness. There is no guarding.  Musculoskeletal:     Right lower leg: No edema.     Left lower leg: No edema.  Skin:    General: Skin is warm and dry.  Neurological:     Mental Status: He is alert. Mental status is at baseline. He is disoriented.     Assessment/Plan:  Active Problems:   Hypernatremia   Chronic diastolic (congestive) heart failure (HCC)   Altered mental status  #Chronic diastolic Heart Failure:  #Acute Respiratory failure #Acute pulmonary edema Patient was back on 2 L oxygen today and so during examination we attempted to wean.  His O2 sats decreased to 82% when he was moving around in the bed.  Patient's weight is increased from yesterday by 1 kg. Likely secondary to receiving D5W to improve hypernatremia, as well as insufficient diuresis.  Physical exam  is not significantly changed from yesterday, he continues to have crackles.  It seems he is up by 1 L today. Will increase diuresis.  Spoke to the nurse regarding very strict ins and outs.  - Lasix 80mg  IV - Bladder scans every 8  #Hypernatremia:  Stable overnight and sodium levels consistently around 145. Due to volume overload, we will not try to correct sodium with D5W today.  - BMP tomorrow AM  #CKD3:  Creatinine is stable without improvement or worsening for the past 2 days.  Baseline appears to be in the 1.2-1.3s.  We will continue with diuresis while watching creatinine closely.  Given his recent history of acute pulmonary edema requiring intubation, diuresis takes precedence at this time.     Dispo: Anticipated discharge pending improved oxygenation and diuresis.   Dr. Jose Persia Internal Medicine PGY-1  Pager: [phone] 10/10/2018, 9:33 AM

## 2018-10-10 NOTE — Progress Notes (Signed)
  Date: 10/10/2018  Patient name: [name]  Medical record number: [id number]  Date of birth: [birth year]   I have seen and evaluated this patient and I have discussed the plan of care with the house staff. Please see their note for complete details. I concur with their findings with the following additions/corrections: [name] was seen this morning on team rounds.  He became hypoxic on room air while at rest to the mid 80s.  He is +1 L and output 1.2 kg.  He has crackles in the right base.  We will increase diuresis today and reassess in the morning.  Bartholomew Crews, MD 10/10/2018, 4:40 PM

## 2018-10-10 NOTE — Progress Notes (Signed)
Occupational Therapy Treatment Patient Details Name: [name] MRN: [id number] DOB: [birth year] Today's Date: 10/10/2018    History of present illness 85 y.o. male who has a PMH including but not limited to CVA, meningioma, HTN, HLD, DVT, anemia.  He presented to [facility] ED 7/23 with AMS.  On EMS arrival, he had sats 75% which improved to 100% on NRB.  ABG demonstrated pCO2 in the 90s.  He was placed on BiPAP but failed and had increasing O2 requirements; therefore, required intubation.  UA also suggestive of UTI. Extubated7/26   OT comments  Pt progressing well with therapy. Pt is a heavy maxA for initial standing balance to reduce posteriolateral R lean and then once pt recovers mod/maxA for stand pivot transfers. Pt performing set-upA for grooming in sitting. Pt gesturing due to [facility]. Pt would greatly benefit from continued OT skilled services. OT following acutely.    Follow Up Recommendations  Home health OT;Supervision/Assistance - 24 hour    Equipment Recommendations  None recommended by OT    Recommendations for Other Services      Precautions / Restrictions Precautions Precautions: Fall Precaution Comments: Strong R lateral lean. Restrictions Weight Bearing Restrictions: No       Mobility Bed Mobility Overal bed mobility: Needs Assistance Bed Mobility: Supine to Sit     Supine to sit: HOB elevated;Mod assist Sit to supine: Min assist   General bed mobility comments: gesturing to come to R side before lying on back  Transfers Overall transfer level: Needs assistance Equipment used: Rolling walker (2 wheeled) Transfers: Sit to/from [name] Sit to Stand: Mod assist Stand pivot transfers: Max assist       General transfer comment: Cues for hand placement to and from seated surface.  Pt with R posterolateral lean in standing.    Balance Overall balance assessment: Needs assistance Sitting-balance support: Feet supported Sitting  balance-Leahy Scale: Fair     Standing balance support: Bilateral upper extremity supported Standing balance-Leahy Scale: Poor                             ADL either performed or assessed with clinical judgement   ADL Overall ADL's : Needs assistance/impaired     Grooming: Set up;Sitting;Wash/dry hands;Wash/dry face;Oral care Grooming Details (indicate cue type and reason): after set-upA, pt took over task                             Functional mobility during ADLs: Maximal assistance;Rolling walker;Cueing for sequencing;Cueing for safety General ADL Comments: Posterior lean sitting EOB and for transfer     Vision   Vision Assessment?: No apparent visual deficits   Perception     Praxis      Cognition Arousal/Alertness: Awake/alert Behavior During Therapy: WFL for tasks assessed/performed Overall Cognitive Status: Difficult to assess                                 General Comments: most likely close to baseline; able to follow gestural cues        Exercises     Shoulder Instructions       General Comments      Pertinent Vitals/ Pain       Pain Assessment: Faces Faces Pain Scale: No hurt  Home Living  Prior Functioning/Environment              Frequency  Min 2X/week        Progress Toward Goals  OT Goals(current goals can now be found in the care plan section)  Progress towards OT goals: Progressing toward goals  Acute Rehab OT Goals Patient Stated Goal: daughter is for pt to return home OT Goal Formulation: With patient/family Time For Goal Achievement: 10/22/18 Potential to Achieve Goals: Good ADL Goals Pt Will Perform Upper Body Bathing: with set-up;sitting Pt Will Perform Lower Body Bathing: with min guard assist;sit to/from stand Pt Will Transfer to Toilet: with min guard assist;bedside commode;ambulating  Plan Discharge plan remains  appropriate    Co-evaluation                 AM-PAC OT "6 Clicks" Daily Activity     Outcome Measure   Help from another person eating meals?: A Little Help from another person taking care of personal grooming?: A Little Help from another person toileting, which includes using toliet, bedpan, or urinal?: A Lot Help from another person bathing (including washing, rinsing, drying)?: A Lot Help from another person to put on and taking off regular upper body clothing?: A Little Help from another person to put on and taking off regular lower body clothing?: A Lot 6 Click Score: 15    End of Session Equipment Utilized During Treatment: Gait belt;Rolling walker  OT Visit Diagnosis: Other abnormalities of gait and mobility (R26.89);Muscle weakness (generalized) (M62.81);Low vision, both eyes (H54.2);Cognitive communication deficit (R41.841);Hemiplegia and hemiparesis Symptoms and signs involving cognitive functions: Cerebral infarction Hemiplegia - Right/Left: Right Hemiplegia - dominant/non-dominant: Dominant Hemiplegia - caused by: Cerebral infarction   Activity Tolerance Patient tolerated treatment well   Patient Left in bed;with call bell/phone within reach;with bed alarm set   Nurse Communication Mobility status        Time: 0093-8182 OT Time Calculation (min): 26 min  Charges: OT General Charges $OT Visit: 1 Visit OT Treatments $Self Care/Home Management : 23-37 mins  Ebony Hail Harold Hedge) Marsa Aris OTR/L Acute Rehabilitation Services Pager: [phone] Office: [place] 10/10/2018, 4:33 PM

## 2018-10-11 ENCOUNTER — Encounter (HOSPITAL_COMMUNITY): Payer: Self-pay | Admitting: *Deleted

## 2018-10-11 DIAGNOSIS — I5033 Acute on chronic diastolic (congestive) heart failure: Secondary | ICD-10-CM

## 2018-10-11 LAB — CBC WITH DIFFERENTIAL/PLATELET
Abs Immature Granulocytes: 0.34 10*3/uL — ABNORMAL HIGH (ref 0.00–0.07)
Basophils Absolute: 0 10*3/uL (ref 0.0–0.1)
Basophils Relative: 0 %
Eosinophils Absolute: 0.3 10*3/uL (ref 0.0–0.5)
Eosinophils Relative: 3 %
HCT: 27.3 % — ABNORMAL LOW (ref 39.0–52.0)
Hemoglobin: 7.5 g/dL — ABNORMAL LOW (ref 13.0–17.0)
Immature Granulocytes: 4 %
Lymphocytes Relative: 22 %
Lymphs Abs: 1.7 10*3/uL (ref 0.7–4.0)
MCH: 25 pg — ABNORMAL LOW (ref 26.0–34.0)
MCHC: 27.5 g/dL — ABNORMAL LOW (ref 30.0–36.0)
MCV: 91 fL (ref 80.0–100.0)
Monocytes Absolute: 0.8 10*3/uL (ref 0.1–1.0)
Monocytes Relative: 10 %
Neutro Abs: 4.9 10*3/uL (ref 1.7–7.7)
Neutrophils Relative %: 61 %
Platelets: 188 10*3/uL (ref 150–400)
RBC: 3 MIL/uL — ABNORMAL LOW (ref 4.22–5.81)
RDW: 16.5 % — ABNORMAL HIGH (ref 11.5–15.5)
WBC: 8 10*3/uL (ref 4.0–10.5)
nRBC: 0 % (ref 0.0–0.2)

## 2018-10-11 LAB — BASIC METABOLIC PANEL
Anion gap: 6 (ref 5–15)
BUN: 33 mg/dL — ABNORMAL HIGH (ref 8–23)
CO2: 34 mmol/L — ABNORMAL HIGH (ref 22–32)
Calcium: 9.3 mg/dL (ref 8.9–10.3)
Chloride: 104 mmol/L (ref 98–111)
Creatinine, Ser: 1.61 mg/dL — ABNORMAL HIGH (ref 0.61–1.24)
GFR calc Af Amer: 43 mL/min — ABNORMAL LOW (ref >60)
GFR calc non Af Amer: 37 mL/min — ABNORMAL LOW (ref >60)
Glucose, Bld: 121 mg/dL — ABNORMAL HIGH (ref 70–99)
Potassium: 4 mmol/L (ref 3.5–5.1)
Sodium: 144 mmol/L (ref 135–145)

## 2018-10-11 MED ORDER — FUROSEMIDE 40 MG PO TABS: 80.0000 mg | ORAL_TABLET | Freq: Every day | ORAL | 2 refills | Status: DC

## 2018-10-11 NOTE — Progress Notes (Addendum)
SATURATION QUALIFICATIONS: (This note is used to comply with regulatory documentation for home oxygen)  Patient Saturations on Room Air at Rest = 89%  Patient Saturations on Room Air while Ambulating = 86%  Patient Saturations on  2 Liters of oxygen while Ambulating = 96%  Please briefly explain why patient needs home oxygen: 96% on 2 Liters.  Desats off O2 and with ambulation.

## 2018-10-11 NOTE — Progress Notes (Signed)
At 0850, after removing O2 at 0820, he had desatted to 80% then went right back up to 96% after O2 was replaced at 2L/.  No signs of distress.

## 2018-10-11 NOTE — Progress Notes (Signed)
   Subjective:   No acute complaints today.  Continues to deny chest pain, shortness of breath.  Objective:  Vital signs in last 24 hours: Vitals:   10/11/18 0406 10/11/18 0850 10/11/18 0851 10/11/18 0855  BP:   129/76   Pulse:   74   Resp:   (!) 21   Temp:   98 F (36.7 C)   TempSrc:   Oral   SpO2:  (!) 80% 96% 97%  Weight: 97.5 kg     Height:       Physical Exam Constitutional:      General: He is not in acute distress.    Appearance: He is obese. He is not toxic-appearing.  HENT:     Head: Normocephalic and atraumatic.  Cardiovascular:     Rate and Rhythm: Normal rate and regular rhythm.     Heart sounds: Murmur (systolic) present.  Pulmonary:     Effort: Pulmonary effort is normal. No respiratory distress.     Breath sounds: Rales (bibasilar, improving) present. No wheezing.  Abdominal:     General: Bowel sounds are normal.     Palpations: Abdomen is soft.  Musculoskeletal:        General: No tenderness.     Right lower leg: No edema.     Left lower leg: No edema.  Skin:    General: Skin is warm and dry.  Neurological:     General: No focal deficit present.     Mental Status: He is alert. Mental status is at baseline.  Psychiatric:        Mood and Affect: Mood normal.     Assessment/Plan:  Active Problems:   Hypernatremia   Chronic diastolic (congestive) heart failure (HCC)   Altered mental status  #Chronic diastolic Heart Failure:  #Acute Respiratory failure #Acute pulmonary edema Successful diuresis overnight with increased dose of Lasix.  Currently -2 L in the morning.  On examination, crackles are improved.  We will attempt to wean off oxygen again today and see how he does.  If unsuccessful, will send home with home O2.  Discussed this plan with the daughter who is in agreement.  We also discussed getting a home scale and daily weights.  -Attempt to wean off oxygen -Discharged with Lasix 80 mg daily  #Hypernatremia:  Resolved.  #CKD3:   Creatinine has actually improved overnight despite diuresis.  No interventions required.     Dispo: Anticipated discharge day.   Dr. Jose Persia Internal Medicine PGY-1  Pager: [phone] 10/11/2018, 9:41 AM

## 2018-10-11 NOTE — Progress Notes (Signed)
  Date: 10/11/2018  Patient name: [name]  Medical record number: [id number]  Date of birth: 1924/06/01   I have seen and evaluated this patient and I have discussed the plan of care with the house staff. Please see their note for complete details. I concur with their findings with the following additions/corrections: [name] was seen this morning on team rounds.  His weight is not accurate today.  With 80 IV Lasix yesterday, he had a net -1.8 L.  Lungs show persistent crackles in the bases but decreasing.  We were able to remove oxygen without desaturation even during our examination.  When the nurse checked him off oxygen, she reported he desatted to 80%.  His creatinine was 1.2 earlier this month, increased to 2.2 while in the ICU and has trended down to 1.6.  Even though he has crackles and possibly some desaturation, I am concerned about further aggressive diuresis.  As he is basically asymptomatic, I would favor [name] him with goal of checking his weight daily and following up closely in the clinic.  Bartholomew Crews, MD 10/11/2018, 3:00 PM

## 2018-10-11 NOTE — TOC Transition Note (Signed)
Transition of Care [facility]) - CM/SW Discharge Note   Patient Details  Name: [name] MRN: [id number] Date of Birth: [birth year]  Transition of Care [facility] CM/SW Contact:  Pollie Friar, RN Phone Number: 10/11/2018, 4:14 PM   Clinical Narrative:    Pt is discharging back home with his daughter and resumption of [facility] services through [facility]. [place] with [facility] aware of the discharge and resumption of services.  Pt with orders for home oxygen. CM spoke to patients daughter: [name] and provided her choice. [facility] selected. Oxygen will be delivered to the room for transport and to the home.  [name] to provide transport home. Bedside RN updated.    Final next level of care: [place] Barriers to Discharge: No Barriers Identified   Patient Goals and CMS Choice Patient states their goals for this hospitalization and ongoing recovery are:: patient unable to state goals at this time CMS Medicare.gov Compare Post Acute Care list provided to:: Patient Represenative (must comment) Choice offered to / list presented to : Adult Children  Discharge Placement                       Discharge Plan and Services     Post Acute Care Choice: Home Health          DME Arranged: Oxygen DME Agency: [facility] Date DME Agency Contacted: 10/11/18   Representative spoke with at DME Agency: [place]: RN, PT, OT, Nurse's Aide [place] Agency: [place] ([facility])        Social Determinants of Health (SDOH) Interventions     Readmission Risk Interventions No flowsheet data found.

## 2018-10-11 NOTE — Care Management Important Message (Signed)
Important Message  Patient Details  Name: [name] MRN: [id number] Date of Birth: July 01, 1924   Medicare Important Message Given:  Yes     [name] 10/11/2018, 2:58 PM

## 2018-10-11 NOTE — Discharge Summary (Signed)
Name: [name] MRN: [id number] DOB: 1924/11/11 85 y.o. PCP: [name], DO  Date of Admission: 10/03/2018 11:26 AM Date of Discharge: 10/11/2018 Attending Physician: Larey Dresser, MD  Discharge Diagnosis: 1. Acute Hypoxic Respiratory Failure 2/2 Acute on Chronic Diastolic Heart Failure 2. Hypernatremia  3. CKD Stage III  Discharge Medications: Allergies as of 10/11/2018   No Known Allergies     Medication List    TAKE these medications   amLODipine 5 MG tablet Commonly known as: NORVASC Take 1 tablet (5 mg total) by mouth daily. What changed: when to take this   atorvastatin 20 MG tablet Commonly known as: LIPITOR Take 1 tablet (20 mg total) by mouth daily at 6 PM.   furosemide 40 MG tablet Commonly known as: LASIX Take 2 tablets (80 mg total) by mouth daily. What changed:   how much to take  when to take this  reasons to take this   metoprolol succinate 50 MG 24 hr tablet Commonly known as: Toprol XL Take 1 tablet (50 mg total) by mouth daily. Take with or immediately following a meal.   triamcinolone ointment 0.5 % Commonly known as: KENALOG Apply 1 application topically as needed (rash). For rash on legs       Disposition and follow-up:   [name] was discharged from [facility] in Stable condition.  At the hospital follow up visit please address:  1.  Acute Hypoxic Respiratory Failure 2/2 Acute on Chronic Diastolic Heart Failure. Assess whether the patient needs continued home oxygen. Assess the patient's volume status and discuss a heart failure action plan.   2.  Labs / imaging needed at time of follow-up: BMP  3.  Pending labs/ test needing follow-up: None  Follow-up Appointments: Follow-up Information    [name], DO Follow up.   Specialty: Internal Medicine Why: Will call you with an appontment on Monday Contact information: [place] [address] [phone]        [facility]   Why: The home health agency will contact you for the next visit. Contact information: [place] by problem list:  1. Acute Hypoxic Respiratory Failure 2/2 Acute on Chronic Diastolic Heart Failure. Patient is a 85 year old male with a history of heart failure preserved ejection fraction who presented to the emergency department via EMS with altered mental status. He was initially found to be hypoxic and placed on hundred percent on rebreather. However he continued to desaturate was subsequently placed on BiPAP. His O2 requirement continued to increase and he was subsequently intubated. He was aggressively diuresis well in the ICU and subsequently extubated on 7/27. After moving out of the ICU he was continued on aggressive diuresis. He subsequently diuresis 8 L for discharge. When looking for the cause of his acute heart failure exacerbation it was determined that his daughter had stopped his Lasix due to polyuria. We discussed a heart failure action plan. He was discharged on furosemide 80 mg daily and will follow-up with her clinic within the next seven days.  2. Hypernatremia. After leaving the ICU the patient was found to be mildly hypernatremic. He was given D5W and with increased PO intake this subsequently resolved.   3. CKD Stage III. Patient with known CKD stage III. Creatinine increased initially but with diureses returned to baseline.  Discharge Vitals:   BP 129/81   Pulse 87   Temp  98.6 F (37 C) (Oral)   Resp (!) 22   Ht 5\' 7"  (1.702 m)   Wt 97.5 kg   SpO2 97%   BMI 33.67 kg/m   Pertinent Labs, Studies, and Procedures:   TTE 7/28  1. The left ventricle has normal systolic function with an ejection fraction of 60-65%. The cavity size was normal. There is mildly increased left ventricular wall thickness. Left ventricular diastolic Doppler parameters are consistent with impaired  relaxation. Indeterminate filling  pressures The E/e' is 8-15. No evidence of left ventricular regional wall motion abnormalities.  2. The right ventricle has normal systolic function. The cavity was normal. There is no increase in right ventricular wall thickness.  3. Left atrial size was severely dilated.  4. The mitral valve is grossly normal.  5. The tricuspid valve is grossly normal.  6. The aortic valve is tricuspid. Mild calcification of the aortic valve. No stenosis of the aortic valve.  7. The aorta is abnormal in size and structure.  8. There is moderate dilatation of the ascending aorta measuring 45 mm.  9. The inferior vena cava was dilated in size with >50% respiratory variability.  Discharge Instructions: Discharge Instructions    (HEART FAILURE PATIENTS) Call MD:  Anytime you have any of the following symptoms: 1) 3 pound weight gain in 24 hours or 5 pounds in 1 week 2) shortness of breath, with or without a dry hacking cough 3) swelling in the hands, feet or stomach 4) if you have to sleep on extra pillows at night in order to breathe.   Complete by: As directed    Diet - low sodium heart healthy   Complete by: As directed    Increase activity slowly   Complete by: As directed    Increase activity slowly   Complete by: As directed      Signed: Ina Homes, MD 10/12/2018, 6:46 PM

## 2018-10-13 DIAGNOSIS — I5032 Chronic diastolic (congestive) heart failure: Secondary | ICD-10-CM | POA: Diagnosis not present

## 2018-10-13 DIAGNOSIS — G3281 Cerebellar ataxia in diseases classified elsewhere: Secondary | ICD-10-CM | POA: Diagnosis not present

## 2018-10-14 ENCOUNTER — Telehealth: Payer: Self-pay | Admitting: *Deleted

## 2018-10-14 ENCOUNTER — Telehealth: Payer: Self-pay

## 2018-10-14 DIAGNOSIS — F0390 Unspecified dementia without behavioral disturbance: Secondary | ICD-10-CM | POA: Diagnosis not present

## 2018-10-14 DIAGNOSIS — D696 Thrombocytopenia, unspecified: Secondary | ICD-10-CM | POA: Diagnosis not present

## 2018-10-14 DIAGNOSIS — N183 Chronic kidney disease, stage 3 (moderate): Secondary | ICD-10-CM | POA: Diagnosis not present

## 2018-10-14 DIAGNOSIS — I5030 Unspecified diastolic (congestive) heart failure: Secondary | ICD-10-CM | POA: Diagnosis not present

## 2018-10-14 DIAGNOSIS — I69393 Ataxia following cerebral infarction: Secondary | ICD-10-CM | POA: Diagnosis not present

## 2018-10-14 DIAGNOSIS — D631 Anemia in chronic kidney disease: Secondary | ICD-10-CM | POA: Diagnosis not present

## 2018-10-14 DIAGNOSIS — K922 Gastrointestinal hemorrhage, unspecified: Secondary | ICD-10-CM | POA: Diagnosis not present

## 2018-10-14 DIAGNOSIS — I130 Hypertensive heart and chronic kidney disease with heart failure and stage 1 through stage 4 chronic kidney disease, or unspecified chronic kidney disease: Secondary | ICD-10-CM | POA: Diagnosis not present

## 2018-10-14 DIAGNOSIS — C709 Malignant neoplasm of meninges, unspecified: Secondary | ICD-10-CM | POA: Diagnosis not present

## 2018-10-14 NOTE — Telephone Encounter (Signed)
Call from [facility], [facility] - stated pt was recently discharged from the hospital d/t resp failure; requesting verbal order for "Continue PT for 2 times a week x 2 weeks". VO given - let me know if not appropriate.

## 2018-10-14 NOTE — Telephone Encounter (Signed)
Pt's daughter requesting to speak with a nurse about oxygen. Please call back.

## 2018-10-15 DIAGNOSIS — I130 Hypertensive heart and chronic kidney disease with heart failure and stage 1 through stage 4 chronic kidney disease, or unspecified chronic kidney disease: Secondary | ICD-10-CM | POA: Diagnosis not present

## 2018-10-15 DIAGNOSIS — D631 Anemia in chronic kidney disease: Secondary | ICD-10-CM | POA: Diagnosis not present

## 2018-10-15 DIAGNOSIS — K922 Gastrointestinal hemorrhage, unspecified: Secondary | ICD-10-CM | POA: Diagnosis not present

## 2018-10-15 DIAGNOSIS — I5030 Unspecified diastolic (congestive) heart failure: Secondary | ICD-10-CM | POA: Diagnosis not present

## 2018-10-15 DIAGNOSIS — F0390 Unspecified dementia without behavioral disturbance: Secondary | ICD-10-CM | POA: Diagnosis not present

## 2018-10-15 DIAGNOSIS — N183 Chronic kidney disease, stage 3 (moderate): Secondary | ICD-10-CM | POA: Diagnosis not present

## 2018-10-15 DIAGNOSIS — D696 Thrombocytopenia, unspecified: Secondary | ICD-10-CM | POA: Diagnosis not present

## 2018-10-15 DIAGNOSIS — C709 Malignant neoplasm of meninges, unspecified: Secondary | ICD-10-CM | POA: Diagnosis not present

## 2018-10-15 DIAGNOSIS — I69393 Ataxia following cerebral infarction: Secondary | ICD-10-CM | POA: Diagnosis not present

## 2018-10-15 NOTE — Telephone Encounter (Signed)
Agree with VO

## 2018-10-15 NOTE — Telephone Encounter (Signed)
I returned phone call to patients daughter,she wanted to speak to the case-workers that had talked with her while her father was in the hospital. I gave her the number to [facility] to see if they could help her Silverio Decamp C8/4/202011:11 AM

## 2018-10-16 DIAGNOSIS — I69393 Ataxia following cerebral infarction: Secondary | ICD-10-CM | POA: Diagnosis not present

## 2018-10-16 DIAGNOSIS — I5030 Unspecified diastolic (congestive) heart failure: Secondary | ICD-10-CM | POA: Diagnosis not present

## 2018-10-16 DIAGNOSIS — K922 Gastrointestinal hemorrhage, unspecified: Secondary | ICD-10-CM | POA: Diagnosis not present

## 2018-10-16 DIAGNOSIS — D631 Anemia in chronic kidney disease: Secondary | ICD-10-CM | POA: Diagnosis not present

## 2018-10-16 DIAGNOSIS — F0390 Unspecified dementia without behavioral disturbance: Secondary | ICD-10-CM | POA: Diagnosis not present

## 2018-10-16 DIAGNOSIS — C709 Malignant neoplasm of meninges, unspecified: Secondary | ICD-10-CM | POA: Diagnosis not present

## 2018-10-16 DIAGNOSIS — N183 Chronic kidney disease, stage 3 (moderate): Secondary | ICD-10-CM | POA: Diagnosis not present

## 2018-10-16 DIAGNOSIS — I130 Hypertensive heart and chronic kidney disease with heart failure and stage 1 through stage 4 chronic kidney disease, or unspecified chronic kidney disease: Secondary | ICD-10-CM | POA: Diagnosis not present

## 2018-10-16 DIAGNOSIS — D696 Thrombocytopenia, unspecified: Secondary | ICD-10-CM | POA: Diagnosis not present

## 2018-10-17 ENCOUNTER — Encounter (HOSPITAL_COMMUNITY): Payer: Self-pay | Admitting: Emergency Medicine

## 2018-10-17 ENCOUNTER — Observation Stay (HOSPITAL_COMMUNITY)
Admission: EM | Admit: 2018-10-17 | Discharge: 2018-10-19 | Disposition: A | Discharge status: Home / Self Care | Payer: Medicare HMO | Attending: Internal Medicine | Admitting: Internal Medicine

## 2018-10-17 ENCOUNTER — Other Ambulatory Visit: Payer: Self-pay

## 2018-10-17 ENCOUNTER — Emergency Department (HOSPITAL_COMMUNITY): Payer: Medicare HMO

## 2018-10-17 DIAGNOSIS — I130 Hypertensive heart and chronic kidney disease with heart failure and stage 1 through stage 4 chronic kidney disease, or unspecified chronic kidney disease: Secondary | ICD-10-CM | POA: Diagnosis not present

## 2018-10-17 DIAGNOSIS — Z8249 Family history of ischemic heart disease and other diseases of the circulatory system: Secondary | ICD-10-CM | POA: Insufficient documentation

## 2018-10-17 DIAGNOSIS — I5032 Chronic diastolic (congestive) heart failure: Secondary | ICD-10-CM | POA: Diagnosis present

## 2018-10-17 DIAGNOSIS — I4430 Unspecified atrioventricular block: Secondary | ICD-10-CM | POA: Diagnosis not present

## 2018-10-17 DIAGNOSIS — R55 Syncope and collapse: Principal | ICD-10-CM | POA: Diagnosis present

## 2018-10-17 DIAGNOSIS — I440 Atrioventricular block, first degree: Secondary | ICD-10-CM | POA: Diagnosis not present

## 2018-10-17 DIAGNOSIS — F0390 Unspecified dementia without behavioral disturbance: Secondary | ICD-10-CM | POA: Diagnosis not present

## 2018-10-17 DIAGNOSIS — E785 Hyperlipidemia, unspecified: Secondary | ICD-10-CM | POA: Diagnosis not present

## 2018-10-17 DIAGNOSIS — I959 Hypotension, unspecified: Secondary | ICD-10-CM | POA: Diagnosis not present

## 2018-10-17 DIAGNOSIS — D72829 Elevated white blood cell count, unspecified: Secondary | ICD-10-CM | POA: Insufficient documentation

## 2018-10-17 DIAGNOSIS — R001 Bradycardia, unspecified: Secondary | ICD-10-CM

## 2018-10-17 DIAGNOSIS — N183 Chronic kidney disease, stage 3 unspecified: Secondary | ICD-10-CM | POA: Diagnosis present

## 2018-10-17 DIAGNOSIS — Z8673 Personal history of transient ischemic attack (TIA), and cerebral infarction without residual deficits: Secondary | ICD-10-CM | POA: Diagnosis not present

## 2018-10-17 DIAGNOSIS — Z86718 Personal history of other venous thrombosis and embolism: Secondary | ICD-10-CM | POA: Diagnosis not present

## 2018-10-17 DIAGNOSIS — Z20828 Contact with and (suspected) exposure to other viral communicable diseases: Secondary | ICD-10-CM | POA: Diagnosis not present

## 2018-10-17 DIAGNOSIS — R319 Hematuria, unspecified: Secondary | ICD-10-CM | POA: Insufficient documentation

## 2018-10-17 DIAGNOSIS — N400 Enlarged prostate without lower urinary tract symptoms: Secondary | ICD-10-CM | POA: Insufficient documentation

## 2018-10-17 DIAGNOSIS — I10 Essential (primary) hypertension: Secondary | ICD-10-CM | POA: Diagnosis present

## 2018-10-17 DIAGNOSIS — R4189 Other symptoms and signs involving cognitive functions and awareness: Secondary | ICD-10-CM

## 2018-10-17 DIAGNOSIS — Z79899 Other long term (current) drug therapy: Secondary | ICD-10-CM | POA: Diagnosis not present

## 2018-10-17 DIAGNOSIS — D631 Anemia in chronic kidney disease: Secondary | ICD-10-CM | POA: Insufficient documentation

## 2018-10-17 DIAGNOSIS — Z03818 Encounter for observation for suspected exposure to other biological agents ruled out: Secondary | ICD-10-CM | POA: Diagnosis not present

## 2018-10-17 DIAGNOSIS — R4020 Unspecified coma: Secondary | ICD-10-CM | POA: Diagnosis not present

## 2018-10-17 DIAGNOSIS — Z66 Do not resuscitate: Secondary | ICD-10-CM | POA: Insufficient documentation

## 2018-10-17 DIAGNOSIS — R404 Transient alteration of awareness: Secondary | ICD-10-CM | POA: Diagnosis not present

## 2018-10-17 LAB — URINALYSIS, ROUTINE W REFLEX MICROSCOPIC
Bacteria, UA: NONE SEEN
Bilirubin Urine: NEGATIVE
Bilirubin Urine: NEGATIVE
Glucose, UA: NEGATIVE mg/dL
Glucose, UA: NEGATIVE mg/dL
Ketones, ur: NEGATIVE mg/dL
Ketones, ur: NEGATIVE mg/dL
Leukocytes,Ua: NEGATIVE
Nitrite: NEGATIVE
Nitrite: NEGATIVE
Protein, ur: NEGATIVE mg/dL
Protein, ur: NEGATIVE mg/dL
Specific Gravity, Urine: 1.004 — ABNORMAL LOW (ref 1.005–1.030)
Specific Gravity, Urine: 1.01 (ref 1.005–1.030)
pH: 8 (ref 5.0–8.0)
pH: 5 (ref 5.0–8.0)

## 2018-10-17 LAB — CBC WITH DIFFERENTIAL/PLATELET
Abs Immature Granulocytes: 0.09 10*3/uL — ABNORMAL HIGH (ref 0.00–0.07)
Basophils Absolute: 0 10*3/uL (ref 0.0–0.1)
Basophils Relative: 0 %
Eosinophils Absolute: 0.2 10*3/uL (ref 0.0–0.5)
Eosinophils Relative: 1 %
HCT: 31.3 % — ABNORMAL LOW (ref 39.0–52.0)
Hemoglobin: 9 g/dL — ABNORMAL LOW (ref 13.0–17.0)
Immature Granulocytes: 1 %
Lymphocytes Relative: 7 %
Lymphs Abs: 1.1 10*3/uL (ref 0.7–4.0)
MCH: 26.5 pg (ref 26.0–34.0)
MCHC: 28.8 g/dL — ABNORMAL LOW (ref 30.0–36.0)
MCV: 92.1 fL (ref 80.0–100.0)
Monocytes Absolute: 1.3 10*3/uL — ABNORMAL HIGH (ref 0.1–1.0)
Monocytes Relative: 8 %
Neutro Abs: 13.2 10*3/uL — ABNORMAL HIGH (ref 1.7–7.7)
Neutrophils Relative %: 83 %
Platelets: 225 10*3/uL (ref 150–400)
RBC: 3.4 MIL/uL — ABNORMAL LOW (ref 4.22–5.81)
RDW: 19.9 % — ABNORMAL HIGH (ref 11.5–15.5)
WBC: 15.8 10*3/uL — ABNORMAL HIGH (ref 4.0–10.5)
nRBC: 0 % (ref 0.0–0.2)

## 2018-10-17 LAB — COMPREHENSIVE METABOLIC PANEL
ALT: 15 U/L (ref 0–44)
AST: 38 U/L (ref 15–41)
Albumin: 3.1 g/dL — ABNORMAL LOW (ref 3.5–5.0)
Alkaline Phosphatase: 45 U/L (ref 38–126)
Anion gap: 10 (ref 5–15)
BUN: 25 mg/dL — ABNORMAL HIGH (ref 8–23)
CO2: 30 mmol/L (ref 22–32)
Calcium: 9.2 mg/dL (ref 8.9–10.3)
Chloride: 99 mmol/L (ref 98–111)
Creatinine, Ser: 1.63 mg/dL — ABNORMAL HIGH (ref 0.61–1.24)
GFR calc Af Amer: 42 mL/min — ABNORMAL LOW (ref >60)
GFR calc non Af Amer: 36 mL/min — ABNORMAL LOW (ref >60)
Glucose, Bld: 103 mg/dL — ABNORMAL HIGH (ref 70–99)
Potassium: 5.3 mmol/L — ABNORMAL HIGH (ref 3.5–5.1)
Sodium: 139 mmol/L (ref 135–145)
Total Bilirubin: 1.4 mg/dL — ABNORMAL HIGH (ref 0.3–1.2)
Total Protein: 6.7 g/dL (ref 6.5–8.1)

## 2018-10-17 LAB — TROPONIN I (HIGH SENSITIVITY)
Troponin I (High Sensitivity): 16 ng/L (ref <18)
Troponin I (High Sensitivity): 24 ng/L — ABNORMAL HIGH (ref <18)

## 2018-10-17 MED ORDER — AMLODIPINE BESYLATE 5 MG PO TABS
5.0000 mg | ORAL_TABLET | Freq: Every day | ORAL | Status: DC
  Administered 2018-10-18 – 2018-10-19 (×2): 5 mg via ORAL
  Filled 2018-10-17 (×2): qty 1

## 2018-10-17 MED ORDER — ACETAMINOPHEN 650 MG RE SUPP: 650.0000 mg | Freq: Four times a day (QID) | RECTAL | Status: DC | PRN

## 2018-10-17 MED ORDER — ATORVASTATIN CALCIUM 10 MG PO TABS
20.0000 mg | ORAL_TABLET | Freq: Every day | ORAL | Status: DC
  Administered 2018-10-18: 19:00:00 20 mg via ORAL
  Filled 2018-10-17: qty 2

## 2018-10-17 MED ORDER — ENOXAPARIN SODIUM 40 MG/0.4ML ~~LOC~~ SOLN: 40.0000 mg | SUBCUTANEOUS | Status: DC

## 2018-10-17 MED ORDER — METOPROLOL SUCCINATE ER 50 MG PO TB24: 50.0000 mg | ORAL_TABLET | Freq: Every day | ORAL | Status: DC

## 2018-10-17 MED ORDER — ACETAMINOPHEN 325 MG PO TABS
650.0000 mg | ORAL_TABLET | Freq: Four times a day (QID) | ORAL | Status: DC | PRN
  Filled 2018-10-17: qty 2

## 2018-10-17 MED ORDER — SODIUM CHLORIDE 0.9% FLUSH
3.0000 mL | Freq: Two times a day (BID) | INTRAVENOUS | Status: DC
  Administered 2018-10-18 (×3): 3 mL via INTRAVENOUS

## 2018-10-17 MED ORDER — ENOXAPARIN SODIUM 40 MG/0.4ML ~~LOC~~ SOLN
40.0000 mg | Freq: Every day | SUBCUTANEOUS | Status: DC
  Administered 2018-10-18 (×2): 40 mg via SUBCUTANEOUS
  Filled 2018-10-17 (×2): qty 0.4

## 2018-10-17 NOTE — H&P (Addendum)
Date: 10/17/2018               Patient Name:  [name] MRN: [id number]  DOB: 1925-02-17 Age / Sex: 85 y.o., male   PCP: [name], DO         Medical Service: Internal Medicine Teaching Service         Attending Physician: Dr. [name], DO    First Contact: Dr. Charleen Kirks  Pager: [phone]  Second Contact: Dr. Tarri Abernethy  Pager: [phone]       After Hours (After 5p/  First Contact Pager: [phone]  weekends / holidays): Second Contact Pager: [phone]   Chief Complaint: unresponsive/syncope  History of Present Illness: This is a 85 year old male with a past medical history significant for hypertension, heart failure with preserved ejection fraction, CVA, and CKD 3 who presents to the ED via EMS for brief episode of unresponsiveness.  Primary historian was his daughter with whom he resides.  Daughter notes that he was sitting out on the porch and patient's grandson went to go check on him after a couple of hours at which time he was found unresponsive.  He was breathing at that time.  Due to inability to awaken the patient, EMS was called.  When EMS arrived they were unable to palpate a radial pulse however the patient was still breathing.  Patient was subsequently transferred to the the ED. Patient's daughter denies any recent changes in his health since hospital discharge on 7/31 for acute hypoxic respiratory failure secondary to chronic diastolic heart failure.  Denies any recent fevers or chills.  Denies any issues with orthopnea.  She does endorse decreased fluid intake over the last week or so.  Daughter notes that patient has been using his oxygen during the day as well as taking his medications appropriately.  ED course: Slight bump in troponin.  Slight increase in ST elevation from prior EKG.  CBC revealed a white count of 15.8 however in the setting of his other CBC counts, this appears to at least be in part, hemo-concentration related.  UA essentially unremarkable  aside from large amount of hemoglobin present.  Chest x-ray revealed a mildly enlarged cardiac silhouette.  No effusions or pneumothorax are appreciated.  Meds:  Amlodipine 5 mg daily Atorvastatin 20 mg daily Lasix 80 mg daily Metoprolol 50 mg daily   Allergies: Allergies as of 10/17/2018   (No Known Allergies)   Past Medical History:  Diagnosis Date   Anemia    BPH (benign prostatic hypertrophy)    TURP 05/19/13   Cerebral embolism with cerebral infarction ([place]) 12/19/2013   Chronic kidney disease    CHRONIC KIDNEY DISEASE, 2   Difficulty hearing, right    BILATERAL HEARING LOSS - BEST TO TRY TO SPEAK INTO LEFT EAR   DVT (deep venous thrombosis) (HCC)    Frequent falls    History of DVT (deep vein thrombosis) 06/10/2013   Provoked s/p TURP. Dx per doppler 06/10/13. Complicated by hematuria while on anticoagulation s/p TURP. Initially on lovenox and coumadin, then stopped, then IVC filter placed 06/16/13. Anticoagulation restarted after hematuria stopped. Lovenox was discontinued apparently on 07/29/13 with comment on high risk for falls.   Total ~1.5 months of anticoagulation but interrupted with bleeding complication.   Hyperlipidemia    Hypertension    Incontinence of urine    SOME INCONTINENCE   Left adrenal mass ([place]) 09/11/2013   Meningioma ([place])    Stroke ([place])  Cerebellar, 2013; WALKS WITH WALKER, ABLE TO DRESS AND BATHE HIMSELF BUT FAMILY TRIES TO PROVIDE SUPERVISION BECAUSE OF HIS HX OF FALL AND WEAKNESS LEGS, ARMS    Thrombocytopenia ([place])    Vitreous hemorrhage ([place]) 12/21/2013   OS 12/18/13 CT /MRI brain done for ataxia     Family History: Mother: Diabetes, heart disease, hypertension.  Father: Heart disease, hypertension  Social History: Patient currently resides with daughter.  Review of Systems: Review of Systems - General ROS: negative for - chills or fever Psychological ROS: negative for - disorientation or irritability ENT ROS: negative for - nasal discharge,  sinus pain, sore throat or visual changes Hematological and Lymphatic ROS: negative for - bruising Endocrine ROS: negative for - palpitations or polydipsia/polyuria Respiratory ROS: no cough, shortness of breath, or wheezing Cardiovascular ROS: negative for - chest pain or edema Gastrointestinal ROS: no abdominal pain, change in bowel habits, or black or bloody stools Genito-Urinary ROS: no dysuria, trouble voiding, or hematuria Musculoskeletal ROS: negative for - muscle pain Neurological ROS: negative for - bowel and bladder control changes or visual changes Dermatological ROS: negative for dry skin and rash  Physical Exam: Blood pressure 125/66, pulse 66, temperature (!) 97 F (36.1 C), temperature source Temporal, resp. rate 13, weight 98 kg, SpO2 100 %.  GENERAL: in no acute distress.  Awake and alert HEENT: head atraumatic. No conjunctival injection. Nares patent. Oropharynx clear, no concerning dental lesions CARDIAC: heart RRR.  +1 pitting edema to the bilateral lower extremities.  No JVD appreciated PULMONARY: acyanotic. Lung sounds clear to auscultation.no wheezing ronci or rales on full bilateral lung examination ABDOMEN: soft. Nontender to palpation.  Nondistended. No organomegaly appreciated. NEURO: CN II-XII grossly intact.with exception of hearing which is decreased bilaterally.  Strength of upper and lower extremities intact.  Sensation intact.  Pupils equal and round SKIN: no rash or lesions no sacral errythema or ulceration, no errythema or lesions on lower extremities or feet PSYCH: A/Ox3. Normal affect  EKG: personally reviewed my interpretation is unchanged from previous EKG  CXR: personally reviewed my interpretation is neg for effusion or pneumothorax  Assessment & Plan by Problem: Principal Problem:   Syncope Active Problems:   HTN (hypertension)   CKD (chronic kidney disease) stage 3, GFR 30-59 ml/min (HCC)   Hyperlipidemia   Chronic diastolic (congestive)  heart failure (HCC)  In summary, this is a 85 year old male with past medical history of heart failure with preserved ejection fraction, hypertension, hyperlipidemia, and CKD 3 who presented to the ED via EMS after a brief episode of unresponsiveness.  #1 syncope/unresponsiveness Patient awake and alert when assessed in the ED. troponin flat.  Patient denies any chest pain Potentially an increased ST elevation from prior EKG normal varient not meeting criteria for true ST elevation Echo from October 08, 2018 revealed findings consistent with diastolic heart failureEF 60 to 65%  Repeat EKG  BNP -Mg  Continuous telemetry monitor  2.  Leukocytosis.  WBC is 15 on admission. CXR did not reveal any infiltrate. UA without bacteria/leukocytes. I suspect that this elevated WBC is at least in part due to some hemoconcentration when taking into relation with the rest of his labs. Repeat CBC in AM unclear cause of leukocytosis I do not feel that this is explained by hemoconcentration alone. UA reassuing overall, CXR and lung exam clear, no obvious skin source of infection.  Will obtain additional history from daughter rochelle, consider additional night of monitoring versus close follow up, no obvious indication for  antibiotics at this time  3.  Chronic diastolic heart failure.  Up about 2 pounds.  Note JVD or pulmonary congestion/crackles noted.  BNP pending.  Daily weights.  Strict I/O.  Fluid restriction.  4.  Hypertension.  Continue amlodipine 5 mg daily Continue metoprolol 50 mg daily  We will hold Lasix for now until BNP results  5.  CKD 3.  Patient appears stable from this standpoint.  No significant changes noted on labs.  Renal diet.  CMP in a.m.  #6 Hyperlipidemia  Continue atorvastatin 20 mg daily   CODE STATUS: Modified DNR no CPR, yes intubation Diet: Renal IV fluids: None DVT for prophylaxis: Lovenox Social considerations: Patient resides with his daughter  Dispo: Admit patient to  Observation with expected length of stay less than 2 midnights.   Signed: Mitzi Hansen, MD 10/17/2018, 11:32 PM  Pager: [phone] @

## 2018-10-17 NOTE — ED Notes (Signed)
Pt is difficult stick, still no iv access, prior RN placed order for IV team consult

## 2018-10-17 NOTE — ED Notes (Signed)
IV team at bedside 

## 2018-10-17 NOTE — ED Provider Notes (Signed)
[facility] EMERGENCY DEPARTMENT Provider Note   CSN: [id number] Arrival date & time: 10/17/18  1704    History   Chief Complaint Chief Complaint  Patient presents with  . Loss of Consciousness    HPI [name] is a 85 y.o. male.    Level 5 caveat due to some difficulty speaking.  Difficulty getting history from the patient. HPI Patient found unresponsive on his porch.  Reportedly been sitting on the chair.  CBG checked with no response of the patient.  Reportedly did not have a radial pulse but was breathing.  Once EMS transferred to stretcher apparently patient became more aware.  Patient really cannot tell me what happened.  Recently started on Lasix.  Denies chest pain or trouble breathing but very difficult to get history from. Past Medical History:  Diagnosis Date  . Anemia   . BPH (benign prostatic hypertrophy)    TURP 05/19/13  . Cerebral embolism with cerebral infarction ([place]) 12/19/2013  . Chronic kidney disease    CHRONIC KIDNEY DISEASE, 2  . Difficulty hearing, right    BILATERAL HEARING LOSS - BEST TO TRY TO SPEAK INTO LEFT EAR  . DVT (deep venous thrombosis) ([place])   . Frequent falls   . History of DVT (deep vein thrombosis) 06/10/2013   Provoked s/p TURP. Dx per doppler 06/10/13. Complicated by hematuria while on anticoagulation s/p TURP. Initially on lovenox and coumadin, then stopped, then IVC filter placed 06/16/13. Anticoagulation restarted after hematuria stopped. Lovenox was discontinued apparently on 07/29/13 with comment on high risk for falls.   Total ~1.5 months of anticoagulation but interrupted with bleeding complication.  . Hyperlipidemia   . Hypertension   . Incontinence of urine    SOME INCONTINENCE  . Left adrenal mass ([place]) 09/11/2013  . Meningioma ([place])   . Stroke ([place])    Cerebellar, 2013; WALKS WITH WALKER, ABLE TO DRESS AND BATHE HIMSELF BUT FAMILY TRIES TO PROVIDE SUPERVISION BECAUSE OF HIS HX OF FALL AND WEAKNESS LEGS, ARMS    . Thrombocytopenia ([place])   . Vitreous hemorrhage ([place]) 12/21/2013   OS 12/18/13 CT /MRI brain done for ataxia     Patient Active Problem List   Diagnosis Date Noted  . Altered mental status 10/03/2018  . Chronic diastolic (congestive) heart failure ([place]) 10/28/2017  . Renal cyst 10/28/2017  . Shortness of breath on exertion 10/03/2017  . Generalized weakness 02/19/2017  . 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 Left ankle swelling 08/03/2016  . Dementia ([place]) 01/28/2016  . Hypernatremia   . UTI (urinary tract infection) 07/17/2014  . Presence of IVC filter 07/17/2014  . Onychomycosis 01/15/2014  . Orthostatic hypotension 07/10/2013  . CKD (chronic kidney disease) stage 3, GFR 30-59 ml/min (HCC) 06/23/2013  . Hyperlipidemia   . Urinary retention 06/15/2013  . Normocytic anemia 06/11/2013  . Physical deconditioning 05/30/2013  . BPH (benign prostatic hyperplasia) 04/24/2013  . Frequent falls 06/23/2012  . 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 Hearing loss 07/20/2011  . Meningioma ([place]) 07/20/2011  . HTN (hypertension) 07/19/2011  . CVA (cerebral infarction) 07/19/2011    Past Surgical History:  Procedure Laterality Date  . CYSTOSCOPY N/A 06/13/2013   Procedure: CYSTOSCOPY FLEXIBLE BEDSIDE;  Surgeon: Ardis Hughs, MD;  Location: [facility];  Service: Urology;  Laterality: N/A;  . MYRINGOTOMY WITH TUBE PLACEMENT Bilateral   . TRANSURETHRAL RESECTION OF PROSTATE N/A 05/19/2013   Procedure: TRANSURETHRAL RESECTION OF THE PROSTATE WITH  GYRUS INSTRUMENTS;  Surgeon: Ailene Rud, MD;  Location: [facility];  Service: Urology;  Laterality: N/A;        Home Medications    Prior to Admission medications   Medication Sig Start Date End Date Taking? Authorizing Provider  amLODipine (NORVASC) 5 MG tablet Take 1 tablet (5 mg total) by mouth daily. Patient taking differently: Take 5 mg by mouth at bedtime.  04/26/18   [name], DO  atorvastatin (LIPITOR) 20 MG tablet Take 1 tablet (20 mg total) by mouth daily at 6 PM. 04/26/18   [name], DO  furosemide (LASIX) 40 MG tablet Take 2 tablets (80 mg total) by mouth daily. 10/11/18   Jose Persia, MD  metoprolol succinate (TOPROL XL) 50 MG 24 hr tablet Take 1 tablet (50 mg total) by mouth daily. Take with or immediately following a meal. 04/26/18 04/26/19  [name], DO  triamcinolone ointment (KENALOG) 0.5 % Apply 1 application topically as needed (rash). For rash on legs 09/06/18   [provider]    Family History Family History  Problem Relation Age of Onset  . Diabetes Mother   . Heart disease Mother   . Hypertension Mother   . Heart disease Father   . Hypertension Father   . Hypertension Daughter     Social History Social History   Tobacco Use  . Smoking status: Never Smoker  . Smokeless tobacco: Never Used  Substance Use Topics  . Alcohol use: No    Alcohol/week: 0.0 standard drinks  . Drug use: No     Allergies   Patient has no known allergies.   Review of Systems Review of Systems  Unable to perform ROS: Mental status change     Physical Exam Updated Vital Signs BP 130/82 (BP Location: Left Arm)   Pulse 81   Temp (!) 97 F (36.1 C) (Temporal)   Resp 13   Wt 98 kg   SpO2 99%   BMI 33.83 kg/m   Physical Exam Vitals signs and nursing note reviewed.  HENT:     Head: Atraumatic.  Neck:     Musculoskeletal: Neck supple.  Cardiovascular:     Rate and Rhythm: Normal rate and regular rhythm.  Pulmonary:     Breath sounds: No wheezing, rhonchi or rales.  Abdominal:     Tenderness: There is no abdominal tenderness.  Musculoskeletal:        General: No tenderness.  Skin:    General: Skin is warm.     Capillary Refill: Capillary refill takes less than 2 seconds.  Neurological:     Mental Status: He is alert.     Comments: Awake and answers questions, but difficulty to get history from.       ED  Treatments / Results  Labs (all labs ordered are listed, but only abnormal results are displayed) Labs Reviewed  COMPREHENSIVE METABOLIC PANEL - Abnormal; Notable for the following components:      Result Value   Potassium 5.3 (*)    Glucose, Bld 103 (*)    BUN 25 (*)    Creatinine, Ser 1.63 (*)    Albumin 3.1 (*)    Total Bilirubin 1.4 (*)    GFR calc non Af Amer 36 (*)    GFR calc Af Amer 42 (*)    All other components within normal limits  CBC WITH DIFFERENTIAL/PLATELET - Abnormal; Notable for the following components:   WBC 15.8 (*)    RBC  3.40 (*)    Hemoglobin 9.0 (*)    HCT 31.3 (*)    MCHC 28.8 (*)    RDW 19.9 (*)    Neutro Abs 13.2 (*)    Monocytes Absolute 1.3 (*)    Abs Immature Granulocytes 0.09 (*)    All other components within normal limits  URINALYSIS, ROUTINE W REFLEX MICROSCOPIC - Abnormal; Notable for the following components:   Color, Urine STRAW (*)    Specific Gravity, Urine 1.004 (*)    Hgb urine dipstick LARGE (*)    All other components within normal limits  TROPONIN I (HIGH SENSITIVITY)  TROPONIN I (HIGH SENSITIVITY)    EKG EKG Interpretation  Date/Time:  Thursday October 17 2018 17:17:46 EDT Ventricular Rate:  74 PR Interval:    QRS Duration: 125 QT Interval:  390 QTC Calculation: 433 R Axis:   -28 Text Interpretation:  Sinus rhythm Nonspecific intraventricular conduction delay Minimal ST elevation, anterior leads No significant change since last tracing Confirmed by Davonna Belling [phone]) on 10/17/2018 8:01:19 PM   Radiology Dg Chest Portable 1 View  Result Date: 10/17/2018 CLINICAL DATA:  Syncopal episode. EXAM: PORTABLE CHEST 1 VIEW COMPARISON:  October 08, 2018 FINDINGS: The cardiac silhouette is mildly enlarged. Prominence of the contour of the ascending aorta, similar to prior radiographs in a patient with known ascending aortic aneurysm. There is no evidence of focal airspace consolidation, pleural effusion or pneumothorax. Osseous  structures are without acute abnormality. Soft tissues are grossly normal. IMPRESSION: 1. Mildly enlarged cardiac silhouette. 2. Prominence of the contour of the ascending aorta, similar to prior radiographs in a patient with known ascending aortic aneurysm. Electronically Signed   By: Fidela Salisbury M.D.   On: 10/17/2018 18:11    Procedures Procedures (including critical care time)  Medications Ordered in ED Medications - No data to display   Initial Impression / Assessment and Plan / ED Course  I have reviewed the triage vital signs and the nursing notes.  Pertinent labs & imaging results that were available during my care of the patient were reviewed by me and considered in my medical decision making (see chart for details).       Patient presented after syncopal episode.  Reportedly was unresponsive on the porch of his house.  Did arouse when transferred EMS stretcher.  Work-up reassuring overall.  Reportedly back at baseline.  Discussed with internal medicine residents, who will see the patient.  Final Clinical Impressions(s) / ED Diagnoses   Final diagnoses:  Unresponsive episode    ED Discharge Orders    None       Davonna Belling, MD 10/17/18 2002

## 2018-10-17 NOTE — ED Triage Notes (Signed)
Pt here from home found on porch sitting in a chair unresponsive , pt woke up once he was placed on a stretcher , pt recently started on lasix , cbg 165

## 2018-10-18 DIAGNOSIS — R319 Hematuria, unspecified: Secondary | ICD-10-CM | POA: Diagnosis not present

## 2018-10-18 DIAGNOSIS — R55 Syncope and collapse: Secondary | ICD-10-CM | POA: Diagnosis not present

## 2018-10-18 DIAGNOSIS — D631 Anemia in chronic kidney disease: Secondary | ICD-10-CM | POA: Diagnosis not present

## 2018-10-18 DIAGNOSIS — D72829 Elevated white blood cell count, unspecified: Secondary | ICD-10-CM | POA: Diagnosis not present

## 2018-10-18 DIAGNOSIS — N401 Enlarged prostate with lower urinary tract symptoms: Secondary | ICD-10-CM

## 2018-10-18 DIAGNOSIS — Z8673 Personal history of transient ischemic attack (TIA), and cerebral infarction without residual deficits: Secondary | ICD-10-CM

## 2018-10-18 DIAGNOSIS — Z66 Do not resuscitate: Secondary | ICD-10-CM | POA: Diagnosis not present

## 2018-10-18 DIAGNOSIS — I5032 Chronic diastolic (congestive) heart failure: Secondary | ICD-10-CM | POA: Diagnosis not present

## 2018-10-18 DIAGNOSIS — Z87440 Personal history of urinary (tract) infections: Secondary | ICD-10-CM

## 2018-10-18 DIAGNOSIS — N39498 Other specified urinary incontinence: Secondary | ICD-10-CM | POA: Diagnosis not present

## 2018-10-18 DIAGNOSIS — Z20828 Contact with and (suspected) exposure to other viral communicable diseases: Secondary | ICD-10-CM | POA: Diagnosis not present

## 2018-10-18 DIAGNOSIS — I130 Hypertensive heart and chronic kidney disease with heart failure and stage 1 through stage 4 chronic kidney disease, or unspecified chronic kidney disease: Secondary | ICD-10-CM | POA: Diagnosis not present

## 2018-10-18 DIAGNOSIS — N183 Chronic kidney disease, stage 3 (moderate): Secondary | ICD-10-CM | POA: Diagnosis not present

## 2018-10-18 DIAGNOSIS — Z79899 Other long term (current) drug therapy: Secondary | ICD-10-CM

## 2018-10-18 DIAGNOSIS — E785 Hyperlipidemia, unspecified: Secondary | ICD-10-CM

## 2018-10-18 DIAGNOSIS — R8281 Pyuria: Secondary | ICD-10-CM | POA: Diagnosis not present

## 2018-10-18 DIAGNOSIS — H9193 Unspecified hearing loss, bilateral: Secondary | ICD-10-CM

## 2018-10-18 LAB — COMPREHENSIVE METABOLIC PANEL
ALT: 13 U/L (ref 0–44)
AST: 16 U/L (ref 15–41)
Albumin: 2.6 g/dL — ABNORMAL LOW (ref 3.5–5.0)
Alkaline Phosphatase: 39 U/L (ref 38–126)
Anion gap: 9 (ref 5–15)
BUN: 25 mg/dL — ABNORMAL HIGH (ref 8–23)
CO2: 32 mmol/L (ref 22–32)
Calcium: 9 mg/dL (ref 8.9–10.3)
Chloride: 99 mmol/L (ref 98–111)
Creatinine, Ser: 1.58 mg/dL — ABNORMAL HIGH (ref 0.61–1.24)
GFR calc Af Amer: 44 mL/min — ABNORMAL LOW (ref >60)
GFR calc non Af Amer: 38 mL/min — ABNORMAL LOW (ref >60)
Glucose, Bld: 131 mg/dL — ABNORMAL HIGH (ref 70–99)
Potassium: 3.7 mmol/L (ref 3.5–5.1)
Sodium: 140 mmol/L (ref 135–145)
Total Bilirubin: 0.6 mg/dL (ref 0.3–1.2)
Total Protein: 6.1 g/dL — ABNORMAL LOW (ref 6.5–8.1)

## 2018-10-18 LAB — CBC
HCT: 27.4 % — ABNORMAL LOW (ref 39.0–52.0)
Hemoglobin: 8 g/dL — ABNORMAL LOW (ref 13.0–17.0)
MCH: 26.1 pg (ref 26.0–34.0)
MCHC: 29.2 g/dL — ABNORMAL LOW (ref 30.0–36.0)
MCV: 89.3 fL (ref 80.0–100.0)
Platelets: 195 10*3/uL (ref 150–400)
RBC: 3.07 MIL/uL — ABNORMAL LOW (ref 4.22–5.81)
RDW: 19.3 % — ABNORMAL HIGH (ref 11.5–15.5)
WBC: 15.1 10*3/uL — ABNORMAL HIGH (ref 4.0–10.5)
nRBC: 0 % (ref 0.0–0.2)

## 2018-10-18 LAB — SARS CORONAVIRUS 2 (TAT 6-24 HRS): SARS Coronavirus 2: NEGATIVE

## 2018-10-18 LAB — MAGNESIUM: Magnesium: 1.8 mg/dL (ref 1.7–2.4)

## 2018-10-18 LAB — BRAIN NATRIURETIC PEPTIDE: B Natriuretic Peptide: 226 pg/mL — ABNORMAL HIGH (ref 0.0–100.0)

## 2018-10-18 MED ORDER — ORAL CARE MOUTH RINSE: 15.0000 mL | Freq: Two times a day (BID) | OROMUCOSAL | Status: DC

## 2018-10-18 NOTE — Plan of Care (Signed)

## 2018-10-18 NOTE — TOC Initial Note (Addendum)
Transition of Care [facility]) - Initial/Assessment Note    Patient Details  Name: [name] MRN: [id number] Date of Birth: [birth year]  [facility] of [facility]) CM/SW Contact:    Marilu Favre, RN Phone Number: 10/18/2018, 2:35 PM  Clinical Narrative:                  Patient alert but not oriented. Spoke to patient's daughter  [name] [phone] , patient from home lives with her has  [place], PT and home oxygen with [employer] . [name] wants to continue with [facility], and has a portable oxygen tank for discharge. Patient also has walker and 3 in 1 .  Valerie with [facility] aware patient in hospital under observation status, if moved to inpatient status will have to have resumption of care orders. Expected Discharge Plan: [place]     Patient Goals and CMS Choice        Expected Discharge Plan and Services Expected Discharge Plan: [place] arrangements for the past 2 months: Single Family Home                                      Prior Living Arrangements/Services Living arrangements for the past 2 months: Single Family Home Lives with:: Adult Children                   Activities of Daily Living      Permission Sought/Granted                  Emotional Assessment              Admission diagnosis:  Unresponsive episode [R41.89] Patient Active Problem List   Diagnosis Date Noted  . Syncope 10/17/2018  . Chronic diastolic (congestive) heart failure ([place]) 10/28/2017  . Renal cyst 10/28/2017  . Shortness of breath on exertion 10/03/2017  . Generalized weakness 02/19/2017  . 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 Dementia ([place]) 01/28/2016  . Hypernatremia   . UTI (urinary tract infection) 07/17/2014  . Presence of IVC filter 07/17/2014  . Onychomycosis 01/15/2014  . Orthostatic hypotension 07/10/2013  . CKD (chronic  kidney disease) stage 3, GFR 30-59 ml/min (HCC) 06/23/2013  . Hyperlipidemia   . Urinary retention 06/15/2013  . Normocytic anemia 06/11/2013  . Physical deconditioning 05/30/2013  . BPH (benign prostatic hyperplasia) 04/24/2013  . Frequent falls 06/23/2012  . 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 Hearing loss 07/20/2011  . Meningioma ([place]) 07/20/2011  . HTN (hypertension) 07/19/2011  . CVA (cerebral infarction) 07/19/2011   PCP:  [name], DO Pharmacy:   [facility] [place] [place] [address] Phone: [phone] Fax: [phone]  [facility] [place] [place] [address] Phone: [phone] Fax: [phone]     Social Determinants of Health (SDOH) Interventions    Readmission Risk Interventions No flowsheet data found.

## 2018-10-18 NOTE — Progress Notes (Signed)
Unable to complete admission questions due to cognitive and verbal limitations. Will pass along to day shift for family involvement.

## 2018-10-18 NOTE — Plan of Care (Signed)
°  Problem: Education: °Goal: Knowledge of General Education information will improve °Description: Including pain rating scale, medication(s)/side effects and non-pharmacologic comfort measures °Outcome: Progressing °  °Problem: Health Behavior/Discharge Planning: °Goal: Ability to manage health-related needs will improve °Outcome: Progressing °  °Problem: Clinical Measurements: °Goal: Ability to maintain clinical measurements within normal limits will improve °Outcome: Progressing °  °Problem: Clinical Measurements: °Goal: Respiratory complications will improve °Outcome: Progressing °  °Problem: Activity: °Goal: Risk for activity intolerance will decrease °Outcome: Progressing °  °

## 2018-10-18 NOTE — Care Management Obs Status (Addendum)
[facility] NOTIFICATION   Patient Details  Name: MAUDE GLOOR MRN: [id number] Date of Birth: 1924-08-11   Medicare Observation Status Notification Given:  Yes  Called daughter [name] [phone] and left message , [name] returned call and discussed   Marilu Favre, RN 10/18/2018, 2:34 PM

## 2018-10-18 NOTE — Progress Notes (Addendum)
Subjective:   [name] reports he is doing well this AM. He does report feeling cold but no other acute complaints at this time. Denies any pain, including chest pain and SOB.   Objective:  Vital signs in last 24 hours: Vitals:   10/18/18 0503 10/18/18 0505 10/18/18 0507 10/18/18 0510  BP: 110/63 121/73 118/73 129/76  Pulse: 62 66 68 62  Resp: 16     Temp: 98 F (36.7 C)     TempSrc: Oral     SpO2: 95%     Weight: 94.2 kg      Physical Exam Constitutional:      Appearance: He is obese.  HENT:     Head: Normocephalic and atraumatic.  Cardiovascular:     Rate and Rhythm: Normal rate and regular rhythm.     Heart sounds: No murmur. No gallop.   Pulmonary:     Effort: Pulmonary effort is normal. No respiratory distress.     Breath sounds: Wheezing (Intermittent and diffuse) present. No rales.  Abdominal:     General: There is no distension.     Palpations: Abdomen is soft.     Tenderness: There is no abdominal tenderness. There is no guarding.  Musculoskeletal:        General: No deformity or signs of injury.     Right lower leg: No edema.     Left lower leg: No edema.  Skin:    General: Skin is warm and dry.     Findings: No bruising or lesion (No evidence of cellulitis or decubitus ulcers).  Neurological:     General: No focal deficit present.     Mental Status: He is alert. Mental status is at baseline.    Assessment/Plan:  Principal Problem:   Syncope Active Problems:   HTN (hypertension)   CKD (chronic kidney disease) stage 3, GFR 30-59 ml/min (HCC)   Hyperlipidemia   Chronic diastolic (congestive) heart failure (HCC)  #Loss of Consciousness: Per admission history, patient was found to be unresponsive by his family after sitting outside for an extended period of time. EMS was able to detect breathing but not a radial pulse. No CPR was done. On presentation to the ED, no acute deficits or complaints. Patient appeared at baseline at that time. Blood  glucose at the ED was unremarkable. No focal deficits to suggest stroke. Per EMS, he aroused quickly once placed on stretcher, so seizure is low on the differential.   EKG did show some abnormalities including PACs with possible AV block as well. Patient does not have a hx of av block. We will evaluate further for arrthymias.    - Telemetry - d/c Metoprolol to assess rhythm - Repeat EKG later this AM.  - Orthostatics ordered  #Leukocytosis:   Increased WBC noted on admission labs without obvious source of infection or inflammation. CXR was unremarkable for sources of infection and respiratory exam is stable from previous visits. No signs of skin lesions on exam. He was recently treated for UTI. He had two UAs since arriving to the ED; with the 1st, no evidence of UTI recurrence, however 2nd UA shows increased WBCs and bacteria. Unclear if UTI is recurrent. No febrile episodes. Will consider blood cultures given recent history of hospitalization, as well as repeat UA tomorrow.  - Repeat CBC tomorrow AM - Monitor for febrile episodes   #Chronic Diastolic Heart Failure:  Up by 2 pounds per history. His daughter reports compliance with Lasix. On examination, no lower  extremity edema present. Overall, he does not appear volume overloaded.   - Continue home Lasix  #Hematuria: Patient was recently treated for UTI, however initial UA demonstrates that has resolved. Hematuria has persisted though. Pt does have a history of BPH that may be contributing, as well as hx of a renal cyst. Given advanced age, will not pursue this while in patient. Outpatient follow up recommended.     Dispo: Anticipated discharge pending further medical evaluation.    Dr. Jose Persia Internal Medicine PGY-1  Pager: [phone] 10/18/2018, 6:40 AM

## 2018-10-19 DIAGNOSIS — Z79899 Other long term (current) drug therapy: Secondary | ICD-10-CM | POA: Diagnosis not present

## 2018-10-19 DIAGNOSIS — R319 Hematuria, unspecified: Secondary | ICD-10-CM | POA: Diagnosis not present

## 2018-10-19 DIAGNOSIS — B965 Pseudomonas (aeruginosa) (mallei) (pseudomallei) as the cause of diseases classified elsewhere: Secondary | ICD-10-CM

## 2018-10-19 DIAGNOSIS — Z8673 Personal history of transient ischemic attack (TIA), and cerebral infarction without residual deficits: Secondary | ICD-10-CM | POA: Diagnosis not present

## 2018-10-19 DIAGNOSIS — R001 Bradycardia, unspecified: Secondary | ICD-10-CM

## 2018-10-19 DIAGNOSIS — I130 Hypertensive heart and chronic kidney disease with heart failure and stage 1 through stage 4 chronic kidney disease, or unspecified chronic kidney disease: Secondary | ICD-10-CM | POA: Diagnosis not present

## 2018-10-19 DIAGNOSIS — I5032 Chronic diastolic (congestive) heart failure: Secondary | ICD-10-CM | POA: Diagnosis not present

## 2018-10-19 DIAGNOSIS — N183 Chronic kidney disease, stage 3 (moderate): Secondary | ICD-10-CM | POA: Diagnosis not present

## 2018-10-19 DIAGNOSIS — R55 Syncope and collapse: Secondary | ICD-10-CM | POA: Diagnosis not present

## 2018-10-19 DIAGNOSIS — N390 Urinary tract infection, site not specified: Secondary | ICD-10-CM

## 2018-10-19 LAB — CBC WITH DIFFERENTIAL/PLATELET
Abs Immature Granulocytes: 0.06 10*3/uL (ref 0.00–0.07)
Basophils Absolute: 0 10*3/uL (ref 0.0–0.1)
Basophils Relative: 0 %
Eosinophils Absolute: 0.2 10*3/uL (ref 0.0–0.5)
Eosinophils Relative: 2 %
HCT: 27.6 % — ABNORMAL LOW (ref 39.0–52.0)
Hemoglobin: 7.9 g/dL — ABNORMAL LOW (ref 13.0–17.0)
Immature Granulocytes: 1 %
Lymphocytes Relative: 15 %
Lymphs Abs: 1.5 10*3/uL (ref 0.7–4.0)
MCH: 25.9 pg — ABNORMAL LOW (ref 26.0–34.0)
MCHC: 28.6 g/dL — ABNORMAL LOW (ref 30.0–36.0)
MCV: 90.5 fL (ref 80.0–100.0)
Monocytes Absolute: 0.8 10*3/uL (ref 0.1–1.0)
Monocytes Relative: 8 %
Neutro Abs: 7.2 10*3/uL (ref 1.7–7.7)
Neutrophils Relative %: 74 %
Platelets: 197 10*3/uL (ref 150–400)
RBC: 3.05 MIL/uL — ABNORMAL LOW (ref 4.22–5.81)
RDW: 19.2 % — ABNORMAL HIGH (ref 11.5–15.5)
WBC: 9.8 10*3/uL (ref 4.0–10.5)
nRBC: 0 % (ref 0.0–0.2)

## 2018-10-19 MED ORDER — FUROSEMIDE 80 MG PO TABS
80.0000 mg | ORAL_TABLET | Freq: Every day | ORAL | Status: DC
  Administered 2018-10-19: 80 mg via ORAL
  Filled 2018-10-19: qty 1

## 2018-10-19 NOTE — Progress Notes (Signed)
   Subjective: Patient doing well this AM. He is not having any SHOB or chest pain. Anxious to go home. Discussed the plan for discharge today. I have called his daughter and updated her about the hospital course and plans for discharge. All questions and concerns addressed.   Objective: Vital signs in last 24 hours: Vitals:   10/18/18 1832 10/18/18 1942 10/19/18 0026 10/19/18 0439  BP: 120/78 133/67 127/60 137/64  Pulse: 79 75 74 70  Resp: 18 20 18 18   Temp: 98.9 F (37.2 C) 98.6 F (37 C) 98.4 F (36.9 C) 98.5 F (36.9 C)  TempSrc: Oral Oral Oral Oral  SpO2: 91% 98% 97% 94%  Weight:    94.8 kg   General: Elderly male in no acute distress Pulm: Good air movement with no wheezing or crackles  CV: Irregular, no murmurs, no rubs   Assessment/Plan:  Loss of Consciousness: - No further episodes since admission  - Telemetry reviewed frequent PAC but no further heart block or bradycardia. Will discontinue metoprolol on discharge  - Have called the patient's daughter to let her know the patient will be discharged today. All questions and concerns addressed.  Chronic Diastolic Heart Failure:  - Volume status appears stable - Continue home Lasix  Hematuria: - 2 urine analysis done in the ED 1 with hematuria and 1 without  - Urine cultures are pending  - Will need to repeat as an outpatient.   Leukocytosis. Resolved  Dispo: Anticipated discharge today.   Ina Homes, MD 10/19/2018, 9:33 AM

## 2018-10-19 NOTE — Progress Notes (Signed)
Internal Medicine Attending:   I saw and examined the patient. I reviewed the resident's note and I agree with the resident's findings and plan as documented in the resident's note. Overall back to his baseline.  Telemetry reviewed as noted frequent P ACEs but no bradycardia.  We will discontinue his metoprolol on discharge.  For his microscopic hematuria we have a urine culture pending I will follow this up.  His leukocytosis did resolve.  I think he is stable for discharge home, he is pleased with this news.

## 2018-10-19 NOTE — Discharge Summary (Signed)
Name: [name] MRN: [id number] DOB: November 11, 1924 85 y.o. PCP: [name], DO  Date of Admission: 10/17/2018  5:04 PM Date of Discharge: 10/19/2018 Attending Physician: [name], DO  Discharge Diagnosis: 1. Syncope  2. Hematuria   Discharge Medications: Allergies as of 10/19/2018   No Known Allergies     Medication List    STOP taking these medications   metoprolol succinate 50 MG 24 hr tablet Commonly known as: Toprol XL     TAKE these medications   amLODipine 5 MG tablet Commonly known as: NORVASC Take 1 tablet (5 mg total) by mouth daily.   atorvastatin 20 MG tablet Commonly known as: LIPITOR Take 1 tablet (20 mg total) by mouth daily at 6 PM.   furosemide 40 MG tablet Commonly known as: LASIX Take 2 tablets (80 mg total) by mouth daily.   triamcinolone ointment 0.5 % Commonly known as: KENALOG Apply 1 application topically as needed (rash). For rash on legs     Disposition and follow-up:   [name] was discharged from [facility] in Stable condition.  At the hospital follow up visit please address:  1.   Syncope  - Ensure there has been no further events.   Hematuria  - Follow-up the urine culture  - Repeat a UA with microscopy   2.  Labs / imaging needed at time of follow-up: UA  3.  Pending labs/ test needing follow-up: Urine Culture  Follow-up Appointments: Follow-up Information    [name], DO Follow up.   Specialty: Internal Medicine Why: They will call you to schedule an appointment.  Contact information: [place] [id number] [place] [facility] Course by problem list:  1. Syncope. Patient is a 85 year old gentleman with a history of hypertension, heart failure with preserved ejection fraction, prior CVA, and CKD stage III who presented to the emergency department with a transient loss of consciousness. Initial workup was unremarkable aside from a leukocytosis  however no source of infection was identified. The following morning after admission and EKG was obtained that illustrated a 2nd type II heart block and bradycardia. The patient's metoprolol was discontinued and he was monitored on telemetry for the next 24 hours. He had no further events. After discussion with his daughter his metoprolol was discontinued and he was discharged home in stable condition.  2. Hematuria. Patient was evaluated for UTI in the clinic on 7/22. At that time he was noted to have a urinary tract infection and started on antibiotics. Repeat UA in the emergency department initially showed hematuria with no pyuria; however repeat showed pyuria with no hematuria. Urine culture was obtained and is still pending. Please repeat urine analysis and follow-up his urine culture at his follow-up visit.  Discharge Vitals:   BP 137/64 (BP Location: Right Arm)    Pulse 70    Temp 98.5 F (36.9 C) (Oral)    Resp 18    Wt 94.8 kg Comment: scale a   SpO2 94%    BMI 32.75 kg/m   Discharge Instructions: Discharge Instructions    (HEART FAILURE PATIENTS) Call MD:  Anytime you have any of the following symptoms: 1) 3 pound weight gain in 24 hours or 5 pounds in 1 week 2) shortness of breath, with or without a dry hacking cough 3) swelling in the hands, feet or stomach 4) if you have to sleep on extra pillows  at night in order to breathe.   Complete by: As directed    Call MD for:  persistant dizziness or light-headedness   Complete by: As directed    Diet - low sodium heart healthy   Complete by: As directed    Increase activity slowly   Complete by: As directed     Signed: Ina Homes, MD 10/19/2018, 9:43 AM

## 2018-10-19 NOTE — TOC Transition Note (Signed)
Transition of Care [facility]) - CM/SW Discharge Note   Patient Details  Name: [name] MRN: [id number] Date of Birth: November 04, 1924  [facility] CM/SW Contact:  Sharin Mons, RN Phone Number: 10/19/2018, 10:27 AM   Clinical Narrative:    Pt will transition to home today with the resumption of home health services ( PT,OT,NA) provided by [facility]. Daughter to provide transportation to home, and will bring portable oxygen tank for ride to home Daughter states has no problems affording Rx Med.  [name] (Daughter)      [phone]       Final next level of care: [place] Barriers to Discharge: No Barriers Identified   Patient Goals and CMS Choice        Discharge Placement                       Discharge Plan and Services                                     Social Determinants of Health (SDOH) Interventions     Readmission Risk Interventions No flowsheet data found.

## 2018-10-20 ENCOUNTER — Telehealth: Payer: Self-pay | Admitting: Internal Medicine

## 2018-10-20 LAB — URINE CULTURE: Culture: 40000 — AB

## 2018-10-20 MED ORDER — CIPROFLOXACIN HCL 500 MG PO TABS: 500.0000 mg | ORAL_TABLET | Freq: Two times a day (BID) | ORAL | 0 refills | Status: AC

## 2018-10-20 NOTE — Telephone Encounter (Signed)
UCx growning Pseudomonas, sensitive to Cipro, called [name], no answer but left VM that I am sending him antibiotic.  Cipro 500mg  BID x 5 days.

## 2018-10-21 ENCOUNTER — Telehealth: Payer: Self-pay

## 2018-10-21 NOTE — Telephone Encounter (Signed)
Hospital TOC per Dr. Tarri Abernethy, discharge 10/19/2018, appt 10/28/2018.

## 2018-10-24 NOTE — Telephone Encounter (Signed)
Transition Care Management Follow-up Telephone Call: Done with daughter, [name]  Date of discharge and from where: 10/19/2018 from [facility]  How have you been since you were released from the hospital? Daughter states, "he's doing good, just waiting on OT/PT to restart." Denies, SHOB, dizziness, unresponsive episodes. States cough has decreased. Only edema present is in hands; none in abdomen or legs/feet. Daughter keeps the [facility] elevated since patient is not keeping oxygen on all night long. [name] bought a scale but is unable to get accurate weight as patient is not confident about stepping up on scale. States he's using the incentive spirometer and can get it up to 1000.  Any questions or concerns? No   Items Reviewed:  Did the pt receive and understand the discharge instructions provided? Yes   Medications obtained and verified? Yes , [name] stopped the metoprolol and picked up the cipro. Today will be his last dose.  Any new allergies since your discharge? No   Dietary orders reviewed? Yes, [name] knows to have patient on low sodium, heart healthy; eats only grilled foods.  Do you have support at home? Yes , patient lives with daughter and 2 grandsons.  Functional Questionnaire: (I = Independent and D = Dependent) ADLs:   Bathing/Dressing- daughter assists with bathing but patient dresses self  Meal Prep- D  Eating- I  Maintaining continence- D, wears diapers  Transferring/Ambulation- I, patient able to stand from w/c and walk with rolling walker.  Managing Meds- D  Follow up appointments reviewed:   PCP Hospital f/u appt confirmed? Yes  Scheduled to see [facility] on 10/28/2018 @ 1345.  [facility] [place] f/u appt confirmed? NA   Are transportation arrangements needed? No daughter drives patient to appts.  If their condition worsens, is the pt aware to call PCP or go to the Emergency Dept.? Yes Was the patient provided with contact information for the PCP's office  or ED? Yes. Also, given main hospital number [phone]) for after hours with instructions to ask for [facility] Resident on call.  Was to pt encouraged to call back with questions or concerns? Yes  L. Silvano Rusk, RN, BSN

## 2018-10-24 NOTE — Telephone Encounter (Signed)
Great, thanks

## 2018-10-25 ENCOUNTER — Telehealth: Payer: Self-pay

## 2018-10-25 NOTE — Telephone Encounter (Signed)
[facility] [phone] VO

## 2018-10-25 NOTE — Telephone Encounter (Signed)
Spoke with patient's daughter this morning to remind her of his appt this coming Monday.  States the occupational therapist, [facility] Newcomer, who came out yesterday states Mr Kulinski had a low grade fever.  Would like a call back at [phone] and her name is [place].

## 2018-10-25 NOTE — Telephone Encounter (Signed)
Jerilynn with [facility] requesting VO for PT. Please call back.

## 2018-10-28 ENCOUNTER — Other Ambulatory Visit: Payer: Self-pay

## 2018-10-28 ENCOUNTER — Ambulatory Visit (INDEPENDENT_AMBULATORY_CARE_PROVIDER_SITE_OTHER): Payer: Medicare HMO | Admitting: Internal Medicine

## 2018-10-28 ENCOUNTER — Encounter: Payer: Self-pay | Admitting: Internal Medicine

## 2018-10-28 VITALS — BP 121/77 | HR 76 | Temp 98.0°F | Ht 67.0 in | Wt 214.9 lb

## 2018-10-28 DIAGNOSIS — I5032 Chronic diastolic (congestive) heart failure: Secondary | ICD-10-CM

## 2018-10-28 DIAGNOSIS — Z87448 Personal history of other diseases of urinary system: Secondary | ICD-10-CM | POA: Diagnosis not present

## 2018-10-28 DIAGNOSIS — Z9079 Acquired absence of other genital organ(s): Secondary | ICD-10-CM

## 2018-10-28 DIAGNOSIS — Z79899 Other long term (current) drug therapy: Secondary | ICD-10-CM | POA: Diagnosis not present

## 2018-10-28 DIAGNOSIS — N3942 Incontinence without sensory awareness: Secondary | ICD-10-CM

## 2018-10-28 DIAGNOSIS — Z9981 Dependence on supplemental oxygen: Secondary | ICD-10-CM | POA: Diagnosis not present

## 2018-10-28 DIAGNOSIS — R32 Unspecified urinary incontinence: Secondary | ICD-10-CM | POA: Insufficient documentation

## 2018-10-28 DIAGNOSIS — N3945 Continuous leakage: Secondary | ICD-10-CM

## 2018-10-28 NOTE — Telephone Encounter (Signed)
VO for 1x week for 4 weeks for ADL's, strengthening, balance Do you agree? Lm for daughter for temp

## 2018-10-28 NOTE — Patient Instructions (Addendum)
  Thank you, Mr.Rico D Strange for allowing [place] to provide your care today. Today we discussed just of heart failure, urinary incontinence, and referral for palliative care.    I have ordered none today labs for you. I will call if any are abnormal.    I have place a referrals to Palliative care for further management of his congestive heart failure.  I have ordered the following tests: Postvoid residual to assess his incontinence.  We changes the following medications: None today  Please follow-up in 1 month with PCP.    Should you have any questions or concerns please call the internal medicine clinic at [phone].    Marianna Payment, D.O. [facility]

## 2018-10-28 NOTE — Assessment & Plan Note (Addendum)
Daughter admits to daily urinary incontinence.  Patient uses a adult diaper due to continuous urinary leaking throughout the day. The patient does not seem to be aware of his incontinence and does not signal his daughter to void his urine. Patient does have a history of BPH with a TURP several years ago.   Plan: - We will perform a post-void residual ultrasound today to make sure the he is not retaining urine.

## 2018-10-28 NOTE — Assessment & Plan Note (Addendum)
[name] continues to be volume overloaded due to congestive heart failure and is roughly 5 to 10 pounds over his baseline weight.  Patient has coarse breath sounds, 1+ pitting edema and JVD.  He requires 2 L of O2 continuously. He denies shortness of breath, chest pain, and is in no apparent distress. Patient will likely require aggressive treatment with loop diuretics to treat decrease his volume overload. Considering the patient has had recent hospital admissions for this issue, we came to conclusion that the patient would benefit from palliative care to better manage his symptoms and try to keep him out of the hospital.  Plan:  -Refer patient up for palliative care - Continue Lasix at current dose.

## 2018-10-28 NOTE — Telephone Encounter (Signed)
Agree with VO

## 2018-10-28 NOTE — Progress Notes (Signed)
   CC: Hospital follow up  HPI:  [name] is a 85 y.o. male with a past medical history stated below and presents today for hospital follow up from 10/19/2018. Please see problem based assessment and plan for additional details.   Past Medical History:  Diagnosis Date  . Anemia   . BPH (benign prostatic hypertrophy)    TURP 05/19/13  . Cerebral embolism with cerebral infarction ([place]) 12/19/2013  . Chronic kidney disease    CHRONIC KIDNEY DISEASE, 2  . Difficulty hearing, right    BILATERAL HEARING LOSS - BEST TO TRY TO SPEAK INTO LEFT EAR  . DVT (deep venous thrombosis) ([place])   . Frequent falls   . History of DVT (deep vein thrombosis) 06/10/2013   Provoked s/p TURP. Dx per doppler 06/10/13. Complicated by hematuria while on anticoagulation s/p TURP. Initially on lovenox and coumadin, then stopped, then IVC filter placed 06/16/13. Anticoagulation restarted after hematuria stopped. Lovenox was discontinued apparently on 07/29/13 with comment on high risk for falls.   Total ~1.5 months of anticoagulation but interrupted with bleeding complication.  . Hyperlipidemia   . Hypertension   . Incontinence of urine    SOME INCONTINENCE  . Left adrenal mass ([place]) 09/11/2013  . Meningioma ([place])   . Stroke ([place])    Cerebellar, 2013; WALKS WITH WALKER, ABLE TO DRESS AND BATHE HIMSELF BUT FAMILY TRIES TO PROVIDE SUPERVISION BECAUSE OF HIS HX OF FALL AND WEAKNESS LEGS, ARMS   . Thrombocytopenia ([place])   . Vitreous hemorrhage ([place]) 12/21/2013   OS 12/18/13 CT /MRI brain done for ataxia      Review of Systems: Review of Systems  Constitutional: Negative for chills, fever, malaise/fatigue and weight loss.  Respiratory: Negative for cough, shortness of breath and wheezing.   Cardiovascular: Positive for leg swelling. Negative for chest pain and orthopnea.  Gastrointestinal: Negative for abdominal pain.  Genitourinary: Negative for dysuria and frequency.       Incontinence     Vitals:   10/28/18 1356  BP: 121/77  Pulse: 76  Temp: 98 F (36.7 C)  SpO2: 100%  Weight: 214 lb 14.4 oz (97.5 kg)  Height: 5\' 7"  (1.702 m)     Physical Exam: Physical Exam  Constitutional: He is well-developed, well-nourished, and in no distress.  Cardiovascular: Normal rate and intact distal pulses. Exam reveals no gallop and no friction rub.  No murmur heard. Pulmonary/Chest: No accessory muscle usage. No tachypnea. No respiratory distress.      Abdominal: Soft. There is no abdominal tenderness.  Musculoskeletal:        General: Edema (+1 pitting edema) present. No tenderness.  Skin: Skin is warm and dry.     Assessment & Plan:   See Encounters Tab for problem based charting.  Patient seen with Dr. Lynnae January

## 2018-10-28 NOTE — Telephone Encounter (Signed)
Lm for rtc 

## 2018-10-29 NOTE — Progress Notes (Signed)
Internal Medicine Clinic Attending  I saw and evaluated the patient.  I personally confirmed the key portions of the history and exam documented by Dr. Marianna Payment and I reviewed pertinent patient test results.  The assessment, diagnosis, and plan were formulated together and I agree with the documentation in the resident's note.   [name] is over his dry weight despite using his 80 mg of Lasix a day.  The daughter states she bought scales but he is he is not steady enough to stand on the scales so the weights are inaccurate.  She also states she cannot detect early changes consistent with volume overload in her father.  He has had 2 recent hospitalizations for volume overload.  The daughter is already having difficulty with urinary incontinence.  We discussed that he would need much more aggressive diuresis as an outpatient which would worsen his incontinence.  We also discussed taking more of a palliative approach since he currently has no respiratory distress despite being volume overloaded.  Her mother was on hospice for metastatic cancer and they had a good experience.  She understands palliative care is difference even though it is under the same organization of hospice.  After discussion, she was in agreement to take a palliative care approach.  I am hoping palliative care can offer more frequent in-home assessments and IV lasix PRN.

## 2018-10-30 DIAGNOSIS — D509 Iron deficiency anemia, unspecified: Secondary | ICD-10-CM | POA: Diagnosis not present

## 2018-10-30 DIAGNOSIS — D631 Anemia in chronic kidney disease: Secondary | ICD-10-CM | POA: Diagnosis not present

## 2018-10-30 DIAGNOSIS — N183 Chronic kidney disease, stage 3 (moderate): Secondary | ICD-10-CM | POA: Diagnosis not present

## 2018-10-30 DIAGNOSIS — F0390 Unspecified dementia without behavioral disturbance: Secondary | ICD-10-CM | POA: Diagnosis not present

## 2018-10-30 DIAGNOSIS — I5032 Chronic diastolic (congestive) heart failure: Secondary | ICD-10-CM | POA: Diagnosis not present

## 2018-10-30 DIAGNOSIS — I130 Hypertensive heart and chronic kidney disease with heart failure and stage 1 through stage 4 chronic kidney disease, or unspecified chronic kidney disease: Secondary | ICD-10-CM | POA: Diagnosis not present

## 2018-10-30 DIAGNOSIS — I69393 Ataxia following cerebral infarction: Secondary | ICD-10-CM | POA: Diagnosis not present

## 2018-10-30 DIAGNOSIS — D329 Benign neoplasm of meninges, unspecified: Secondary | ICD-10-CM | POA: Diagnosis not present

## 2018-10-30 DIAGNOSIS — I350 Nonrheumatic aortic (valve) stenosis: Secondary | ICD-10-CM | POA: Diagnosis not present

## 2018-10-31 ENCOUNTER — Telehealth: Payer: Self-pay | Admitting: *Deleted

## 2018-10-31 NOTE — Telephone Encounter (Signed)
Call from [place] with [facility] - requesting verbal orders for " To extend PT for 2 times a week x 4 weeks then once a week x 4 weeks". Stated pt has slight set back from recent hospitalization but using a walker and working on sit to stand. VO given - if not appropriate, let me know.  thanks

## 2018-10-31 NOTE — Telephone Encounter (Signed)
Following Staff Message received from [facility], RN at [facility]:  We don't have one specifically for heart failure patients. We tend to refer our patients to the [facility] home based palliative care program through [facility]. Their number is [phone]. Let me know if you have any other questions. I would be more than glad to assist.   Call placed to Margret Chance, Admissions nurse at [facility]. Message left on VM requesting return call to discuss referral to palliative care. [name], RN, BSN

## 2018-10-31 NOTE — Telephone Encounter (Signed)
[name] returned call and will be able to take patient for palliative care services. She will call back with SOC date.  [name], RN, BSN

## 2018-10-31 NOTE — Telephone Encounter (Signed)
agree

## 2018-11-01 DIAGNOSIS — D509 Iron deficiency anemia, unspecified: Secondary | ICD-10-CM | POA: Diagnosis not present

## 2018-11-01 DIAGNOSIS — F0390 Unspecified dementia without behavioral disturbance: Secondary | ICD-10-CM | POA: Diagnosis not present

## 2018-11-01 DIAGNOSIS — I130 Hypertensive heart and chronic kidney disease with heart failure and stage 1 through stage 4 chronic kidney disease, or unspecified chronic kidney disease: Secondary | ICD-10-CM | POA: Diagnosis not present

## 2018-11-01 DIAGNOSIS — I69393 Ataxia following cerebral infarction: Secondary | ICD-10-CM | POA: Diagnosis not present

## 2018-11-01 DIAGNOSIS — I350 Nonrheumatic aortic (valve) stenosis: Secondary | ICD-10-CM | POA: Diagnosis not present

## 2018-11-01 DIAGNOSIS — N183 Chronic kidney disease, stage 3 (moderate): Secondary | ICD-10-CM | POA: Diagnosis not present

## 2018-11-01 DIAGNOSIS — D329 Benign neoplasm of meninges, unspecified: Secondary | ICD-10-CM | POA: Diagnosis not present

## 2018-11-01 DIAGNOSIS — D631 Anemia in chronic kidney disease: Secondary | ICD-10-CM | POA: Diagnosis not present

## 2018-11-01 DIAGNOSIS — I5032 Chronic diastolic (congestive) heart failure: Secondary | ICD-10-CM | POA: Diagnosis not present

## 2018-11-01 NOTE — Telephone Encounter (Signed)
Manus Gunning with [facility] called to inform us they will enroll patient in their Palliative Care Services on Tuesday, 11/05/2018. [name], RN, BSN

## 2018-11-02 ENCOUNTER — Other Ambulatory Visit: Payer: Self-pay

## 2018-11-02 ENCOUNTER — Telehealth: Payer: Self-pay | Admitting: Internal Medicine

## 2018-11-02 ENCOUNTER — Encounter (HOSPITAL_COMMUNITY): Payer: Self-pay | Admitting: *Deleted

## 2018-11-02 ENCOUNTER — Inpatient Hospital Stay (HOSPITAL_COMMUNITY): Payer: Medicare HMO

## 2018-11-02 ENCOUNTER — Emergency Department (HOSPITAL_COMMUNITY): Payer: Medicare HMO

## 2018-11-02 ENCOUNTER — Inpatient Hospital Stay (HOSPITAL_COMMUNITY)
Admission: EM | Admit: 2018-11-02 | Discharge: 2018-11-04 | DRG: 065 | Disposition: A | Discharge status: Home Health | Payer: Medicare HMO | Attending: Internal Medicine | Admitting: Internal Medicine

## 2018-11-02 DIAGNOSIS — Z8249 Family history of ischemic heart disease and other diseases of the circulatory system: Secondary | ICD-10-CM

## 2018-11-02 DIAGNOSIS — I6340 Cerebral infarction due to embolism of unspecified cerebral artery: Secondary | ICD-10-CM | POA: Diagnosis not present

## 2018-11-02 DIAGNOSIS — G9389 Other specified disorders of brain: Secondary | ICD-10-CM | POA: Diagnosis present

## 2018-11-02 DIAGNOSIS — I82551 Chronic embolism and thrombosis of right peroneal vein: Secondary | ICD-10-CM | POA: Diagnosis present

## 2018-11-02 DIAGNOSIS — I358 Other nonrheumatic aortic valve disorders: Secondary | ICD-10-CM | POA: Diagnosis present

## 2018-11-02 DIAGNOSIS — D696 Thrombocytopenia, unspecified: Secondary | ICD-10-CM | POA: Diagnosis present

## 2018-11-02 DIAGNOSIS — I63531 Cerebral infarction due to unspecified occlusion or stenosis of right posterior cerebral artery: Secondary | ICD-10-CM | POA: Diagnosis not present

## 2018-11-02 DIAGNOSIS — E669 Obesity, unspecified: Secondary | ICD-10-CM | POA: Diagnosis present

## 2018-11-02 DIAGNOSIS — I10 Essential (primary) hypertension: Secondary | ICD-10-CM | POA: Diagnosis not present

## 2018-11-02 DIAGNOSIS — I77810 Thoracic aortic ectasia: Secondary | ICD-10-CM | POA: Diagnosis present

## 2018-11-02 DIAGNOSIS — R296 Repeated falls: Secondary | ICD-10-CM | POA: Diagnosis present

## 2018-11-02 DIAGNOSIS — I82511 Chronic embolism and thrombosis of right femoral vein: Secondary | ICD-10-CM | POA: Diagnosis not present

## 2018-11-02 DIAGNOSIS — E785 Hyperlipidemia, unspecified: Secondary | ICD-10-CM | POA: Diagnosis present

## 2018-11-02 DIAGNOSIS — D509 Iron deficiency anemia, unspecified: Secondary | ICD-10-CM | POA: Diagnosis not present

## 2018-11-02 DIAGNOSIS — R531 Weakness: Secondary | ICD-10-CM | POA: Diagnosis not present

## 2018-11-02 DIAGNOSIS — Z79899 Other long term (current) drug therapy: Secondary | ICD-10-CM

## 2018-11-02 DIAGNOSIS — R471 Dysarthria and anarthria: Secondary | ICD-10-CM | POA: Diagnosis present

## 2018-11-02 DIAGNOSIS — G8194 Hemiplegia, unspecified affecting left nondominant side: Secondary | ICD-10-CM | POA: Diagnosis not present

## 2018-11-02 DIAGNOSIS — H9193 Unspecified hearing loss, bilateral: Secondary | ICD-10-CM | POA: Diagnosis present

## 2018-11-02 DIAGNOSIS — D500 Iron deficiency anemia secondary to blood loss (chronic): Secondary | ICD-10-CM

## 2018-11-02 DIAGNOSIS — N183 Chronic kidney disease, stage 3 (moderate): Secondary | ICD-10-CM | POA: Diagnosis present

## 2018-11-02 DIAGNOSIS — I69351 Hemiplegia and hemiparesis following cerebral infarction affecting right dominant side: Secondary | ICD-10-CM

## 2018-11-02 DIAGNOSIS — R29818 Other symptoms and signs involving the nervous system: Secondary | ICD-10-CM | POA: Diagnosis present

## 2018-11-02 DIAGNOSIS — Z8673 Personal history of transient ischemic attack (TIA), and cerebral infarction without residual deficits: Secondary | ICD-10-CM | POA: Diagnosis not present

## 2018-11-02 DIAGNOSIS — Z20828 Contact with and (suspected) exposure to other viral communicable diseases: Secondary | ICD-10-CM | POA: Diagnosis present

## 2018-11-02 DIAGNOSIS — I5030 Unspecified diastolic (congestive) heart failure: Secondary | ICD-10-CM | POA: Diagnosis not present

## 2018-11-02 DIAGNOSIS — I5032 Chronic diastolic (congestive) heart failure: Secondary | ICD-10-CM | POA: Diagnosis present

## 2018-11-02 DIAGNOSIS — R29702 NIHSS score 2: Secondary | ICD-10-CM | POA: Diagnosis present

## 2018-11-02 DIAGNOSIS — Z86718 Personal history of other venous thrombosis and embolism: Secondary | ICD-10-CM | POA: Diagnosis not present

## 2018-11-02 DIAGNOSIS — Z95828 Presence of other vascular implants and grafts: Secondary | ICD-10-CM

## 2018-11-02 DIAGNOSIS — R269 Unspecified abnormalities of gait and mobility: Secondary | ICD-10-CM | POA: Diagnosis not present

## 2018-11-02 DIAGNOSIS — Z833 Family history of diabetes mellitus: Secondary | ICD-10-CM

## 2018-11-02 DIAGNOSIS — D631 Anemia in chronic kidney disease: Secondary | ICD-10-CM | POA: Diagnosis not present

## 2018-11-02 DIAGNOSIS — D649 Anemia, unspecified: Secondary | ICD-10-CM | POA: Diagnosis not present

## 2018-11-02 DIAGNOSIS — I130 Hypertensive heart and chronic kidney disease with heart failure and stage 1 through stage 4 chronic kidney disease, or unspecified chronic kidney disease: Secondary | ICD-10-CM | POA: Diagnosis not present

## 2018-11-02 DIAGNOSIS — D329 Benign neoplasm of meninges, unspecified: Secondary | ICD-10-CM | POA: Diagnosis present

## 2018-11-02 DIAGNOSIS — R270 Ataxia, unspecified: Secondary | ICD-10-CM | POA: Diagnosis not present

## 2018-11-02 DIAGNOSIS — Z66 Do not resuscitate: Secondary | ICD-10-CM | POA: Diagnosis present

## 2018-11-02 DIAGNOSIS — Z7982 Long term (current) use of aspirin: Secondary | ICD-10-CM | POA: Diagnosis not present

## 2018-11-02 DIAGNOSIS — I639 Cerebral infarction, unspecified: Secondary | ICD-10-CM | POA: Diagnosis not present

## 2018-11-02 DIAGNOSIS — N400 Enlarged prostate without lower urinary tract symptoms: Secondary | ICD-10-CM | POA: Diagnosis present

## 2018-11-02 DIAGNOSIS — I82541 Chronic embolism and thrombosis of right tibial vein: Secondary | ICD-10-CM | POA: Diagnosis not present

## 2018-11-02 DIAGNOSIS — Z6833 Body mass index (BMI) 33.0-33.9, adult: Secondary | ICD-10-CM

## 2018-11-02 DIAGNOSIS — Z8719 Personal history of other diseases of the digestive system: Secondary | ICD-10-CM | POA: Diagnosis not present

## 2018-11-02 DIAGNOSIS — I82501 Chronic embolism and thrombosis of unspecified deep veins of right lower extremity: Secondary | ICD-10-CM | POA: Diagnosis not present

## 2018-11-02 DIAGNOSIS — Z9181 History of falling: Secondary | ICD-10-CM

## 2018-11-02 DIAGNOSIS — Z7902 Long term (current) use of antithrombotics/antiplatelets: Secondary | ICD-10-CM | POA: Diagnosis not present

## 2018-11-02 DIAGNOSIS — R319 Hematuria, unspecified: Secondary | ICD-10-CM | POA: Diagnosis not present

## 2018-11-02 DIAGNOSIS — Z9079 Acquired absence of other genital organ(s): Secondary | ICD-10-CM

## 2018-11-02 LAB — COMPREHENSIVE METABOLIC PANEL
ALT: 14 U/L (ref 0–44)
AST: 21 U/L (ref 15–41)
Albumin: 3.2 g/dL — ABNORMAL LOW (ref 3.5–5.0)
Alkaline Phosphatase: 50 U/L (ref 38–126)
Anion gap: 9 (ref 5–15)
BUN: 17 mg/dL (ref 8–23)
CO2: 31 mmol/L (ref 22–32)
Calcium: 9.4 mg/dL (ref 8.9–10.3)
Chloride: 100 mmol/L (ref 98–111)
Creatinine, Ser: 1.43 mg/dL — ABNORMAL HIGH (ref 0.61–1.24)
GFR calc Af Amer: 49 mL/min — ABNORMAL LOW (ref >60)
GFR calc non Af Amer: 43 mL/min — ABNORMAL LOW (ref >60)
Glucose, Bld: 98 mg/dL (ref 70–99)
Potassium: 3.7 mmol/L (ref 3.5–5.1)
Sodium: 140 mmol/L (ref 135–145)
Total Bilirubin: 0.9 mg/dL (ref 0.3–1.2)
Total Protein: 6.6 g/dL (ref 6.5–8.1)

## 2018-11-02 LAB — DIFFERENTIAL
Abs Immature Granulocytes: 0.03 10*3/uL (ref 0.00–0.07)
Basophils Absolute: 0 10*3/uL (ref 0.0–0.1)
Basophils Relative: 0 %
Eosinophils Absolute: 0.3 10*3/uL (ref 0.0–0.5)
Eosinophils Relative: 4 %
Immature Granulocytes: 0 %
Lymphocytes Relative: 16 %
Lymphs Abs: 1.3 10*3/uL (ref 0.7–4.0)
Monocytes Absolute: 0.5 10*3/uL (ref 0.1–1.0)
Monocytes Relative: 6 %
Neutro Abs: 6.1 10*3/uL (ref 1.7–7.7)
Neutrophils Relative %: 74 %

## 2018-11-02 LAB — PROTIME-INR
INR: 1.1 (ref 0.8–1.2)
Prothrombin Time: 14.1 seconds (ref 11.4–15.2)

## 2018-11-02 LAB — CBC
HCT: 34.5 % — ABNORMAL LOW (ref 39.0–52.0)
Hemoglobin: 10 g/dL — ABNORMAL LOW (ref 13.0–17.0)
MCH: 26.3 pg (ref 26.0–34.0)
MCHC: 29 g/dL — ABNORMAL LOW (ref 30.0–36.0)
MCV: 90.8 fL (ref 80.0–100.0)
Platelets: 160 10*3/uL (ref 150–400)
RBC: 3.8 MIL/uL — ABNORMAL LOW (ref 4.22–5.81)
RDW: 19.9 % — ABNORMAL HIGH (ref 11.5–15.5)
WBC: 8.4 10*3/uL (ref 4.0–10.5)
nRBC: 0 % (ref 0.0–0.2)

## 2018-11-02 LAB — SARS CORONAVIRUS 2 (TAT 6-24 HRS): SARS Coronavirus 2: NEGATIVE

## 2018-11-02 LAB — CBG MONITORING, ED: Glucose-Capillary: 88 mg/dL (ref 70–99)

## 2018-11-02 LAB — APTT: aPTT: 32 seconds (ref 24–36)

## 2018-11-02 MED ORDER — ACETAMINOPHEN 325 MG PO TABS: 650.0000 mg | ORAL_TABLET | ORAL | Status: DC | PRN

## 2018-11-02 MED ORDER — STROKE: EARLY STAGES OF RECOVERY BOOK
Freq: Once | Status: AC
  Administered 2018-11-03: 1
  Filled 2018-11-02: qty 1

## 2018-11-02 MED ORDER — SENNOSIDES-DOCUSATE SODIUM 8.6-50 MG PO TABS: 1.0000 | ORAL_TABLET | Freq: Every evening | ORAL | Status: DC | PRN

## 2018-11-02 MED ORDER — ACETAMINOPHEN 160 MG/5ML PO SOLN: 650.0000 mg | ORAL | Status: DC | PRN

## 2018-11-02 MED ORDER — SODIUM CHLORIDE 0.9 % IV SOLN
INTRAVENOUS | Status: DC
  Administered 2018-11-02 (×2): via INTRAVENOUS

## 2018-11-02 MED ORDER — ENOXAPARIN SODIUM 40 MG/0.4ML ~~LOC~~ SOLN
40.0000 mg | SUBCUTANEOUS | Status: DC
  Administered 2018-11-02 – 2018-11-03 (×2): 40 mg via SUBCUTANEOUS
  Filled 2018-11-02 (×2): qty 0.4

## 2018-11-02 MED ORDER — ASPIRIN EC 81 MG PO TBEC
81.0000 mg | DELAYED_RELEASE_TABLET | Freq: Every day | ORAL | Status: DC
  Administered 2018-11-03 – 2018-11-04 (×2): 81 mg via ORAL
  Filled 2018-11-02 (×2): qty 1

## 2018-11-02 MED ORDER — SODIUM CHLORIDE 0.9% FLUSH
3.0000 mL | Freq: Once | INTRAVENOUS | Status: AC
  Administered 2018-11-02: 3 mL via INTRAVENOUS

## 2018-11-02 MED ORDER — ROSUVASTATIN CALCIUM 20 MG PO TABS
20.0000 mg | ORAL_TABLET | Freq: Every day | ORAL | Status: DC
  Administered 2018-11-02 – 2018-11-03 (×2): 20 mg via ORAL
  Filled 2018-11-02 (×2): qty 1

## 2018-11-02 MED ORDER — ACETAMINOPHEN 650 MG RE SUPP: 650.0000 mg | RECTAL | Status: DC | PRN

## 2018-11-02 MED ORDER — GADOBUTROL 1 MMOL/ML IV SOLN
10.0000 mL | Freq: Once | INTRAVENOUS | Status: AC | PRN
  Administered 2018-11-02: 10 mL via INTRAVENOUS

## 2018-11-02 MED ORDER — ASPIRIN EC 325 MG PO TBEC: 325.0000 mg | DELAYED_RELEASE_TABLET | Freq: Once | ORAL | Status: DC

## 2018-11-02 NOTE — ED Notes (Signed)
Patient going to mri at this time

## 2018-11-02 NOTE — ED Provider Notes (Signed)
[facility] EMERGENCY DEPARTMENT Provider Note   CSN: [id number] Arrival date & time: 11/02/18  1305     History   Chief Complaint Chief Complaint  Patient presents with   Weakness    HPI [name] is a 85 y.o. male.     The history is provided by the patient, a relative and medical records. No language interpreter was used.  Neurologic Problem This is a new problem. The current episode started 12 to 24 hours ago. The problem occurs constantly. The problem has been gradually improving. Pertinent negatives include no chest pain, no abdominal pain, no headaches and no shortness of breath. Nothing aggravates the symptoms. Nothing relieves the symptoms. He has tried nothing for the symptoms. The treatment provided no relief.    Past Medical History:  Diagnosis Date   Anemia    BPH (benign prostatic hypertrophy)    TURP 05/19/13   Cerebral embolism with cerebral infarction ([place]) 12/19/2013   Chronic kidney disease    CHRONIC KIDNEY DISEASE, 2   Difficulty hearing, right    BILATERAL HEARING LOSS - BEST TO TRY TO SPEAK INTO LEFT EAR   DVT (deep venous thrombosis) (HCC)    Frequent falls    History of DVT (deep vein thrombosis) 06/10/2013   Provoked s/p TURP. Dx per doppler 06/10/13. Complicated by hematuria while on anticoagulation s/p TURP. Initially on lovenox and coumadin, then stopped, then IVC filter placed 06/16/13. Anticoagulation restarted after hematuria stopped. Lovenox was discontinued apparently on 07/29/13 with comment on high risk for falls.   Total ~1.5 months of anticoagulation but interrupted with bleeding complication.   Hyperlipidemia    Hypertension    Incontinence of urine    SOME INCONTINENCE   Left adrenal mass ([place]) 09/11/2013   Meningioma ([place])    Stroke ([place])    Cerebellar, 2013; WALKS WITH WALKER, ABLE TO DRESS AND BATHE HIMSELF BUT FAMILY TRIES TO PROVIDE SUPERVISION BECAUSE OF HIS HX OF FALL AND WEAKNESS LEGS, ARMS     Thrombocytopenia ([place])    Vitreous hemorrhage ([place]) 12/21/2013   OS 12/18/13 CT /MRI brain done for ataxia     Patient Active Problem List   Diagnosis Date Noted   Incontinence 10/28/2018   Bradycardia, sinus    Syncope 10/17/2018   Chronic diastolic (congestive) heart failure ([place]) 10/28/2017   Renal cyst 10/28/2017   Shortness of breath on exertion 10/03/2017   Generalized weakness 02/19/2017   Cerebellar ataxia in diseases classified elsewhere ([place]) 02/13/2017   Dementia ([place]) 01/28/2016   Hypernatremia    UTI (urinary tract infection) 07/17/2014   Presence of IVC filter 07/17/2014   Onychomycosis 01/15/2014   Orthostatic hypotension 07/10/2013   CKD (chronic kidney disease) stage 3, GFR 30-59 ml/min (HCC) 06/23/2013   Hyperlipidemia    Urinary retention 06/15/2013   Normocytic anemia 06/11/2013   Physical deconditioning 05/30/2013   BPH (benign prostatic hyperplasia) 04/24/2013   Frequent falls 06/23/2012   Ataxia, 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   Hearing loss 07/20/2011   Meningioma ([place]) 07/20/2011   HTN (hypertension) 07/19/2011   CVA (cerebral infarction) 07/19/2011    Past Surgical History:  Procedure Laterality Date   CYSTOSCOPY N/A 06/13/2013   Procedure: CYSTOSCOPY FLEXIBLE BEDSIDE;  Surgeon: Ardis Hughs, MD;  Location: [place];  Service: Urology;  Laterality: N/A;   MYRINGOTOMY WITH TUBE PLACEMENT Bilateral    TRANSURETHRAL RESECTION OF PROSTATE N/A 05/19/2013   Procedure: TRANSURETHRAL RESECTION OF THE PROSTATE WITH GYRUS INSTRUMENTS;  Surgeon: Ailene Rud, MD;  Location: [facility];  Service: Urology;  Laterality: N/A;        Home Medications    Prior to Admission medications   Medication Sig Start Date End Date Taking? Authorizing Provider  amLODipine (NORVASC) 5 MG tablet Take 1 tablet (5 mg total) by mouth daily. 04/26/18   [name], DO  atorvastatin (LIPITOR) 20 MG tablet Take 1 tablet  (20 mg total) by mouth daily at 6 PM. 04/26/18   [name], DO  furosemide (LASIX) 40 MG tablet Take 2 tablets (80 mg total) by mouth daily. 10/11/18   Jose Persia, MD  triamcinolone ointment (KENALOG) 0.5 % Apply 1 application topically as needed (rash). For rash on legs 09/06/18   [provider]    Family History Family History  Problem Relation Age of Onset   Diabetes Mother    Heart disease Mother    Hypertension Mother    Heart disease Father    Hypertension Father    Hypertension Daughter     Social History Social History   Tobacco Use   Smoking status: Never Smoker   Smokeless tobacco: Never Used  Substance Use Topics   Alcohol use: No    Alcohol/week: 0.0 standard drinks   Drug use: No     Allergies   Patient has no known allergies.   Review of Systems Review of Systems  Constitutional: Negative for chills, diaphoresis, fatigue and fever.  HENT: Negative for congestion.   Eyes: Negative for visual disturbance.  Respiratory: Negative for cough, chest tightness, shortness of breath and wheezing.   Cardiovascular: Negative for chest pain.  Gastrointestinal: Negative for abdominal pain, constipation, diarrhea, nausea and vomiting.  Genitourinary: Negative for dysuria and enuresis.  Musculoskeletal: Negative for back pain, neck pain and neck stiffness.  Neurological: Positive for tremors and weakness (L arm weaknessnad coordination problem). Negative for dizziness, seizures, facial asymmetry, speech difficulty, light-headedness, numbness and headaches.  Psychiatric/Behavioral: Negative for agitation and confusion.  All other systems reviewed and are negative.    Physical Exam Updated Vital Signs BP 134/88 (BP Location: Left Arm)    Pulse 86    Temp 98.8 F (37.1 C) (Oral)    Resp 20    SpO2 99%   Physical Exam Vitals signs and nursing note reviewed.  Constitutional:      General: He is not in acute distress.    Appearance: He is  well-developed. He is not ill-appearing, toxic-appearing or diaphoretic.  HENT:     Head: Normocephalic and atraumatic.     Nose: No congestion or rhinorrhea.     Mouth/Throat:     Mouth: Mucous membranes are moist.     Pharynx: No oropharyngeal exudate or posterior oropharyngeal erythema.  Eyes:     Extraocular Movements: Extraocular movements intact.     Conjunctiva/sclera: Conjunctivae normal.     Pupils: Pupils are equal, round, and reactive to light.  Neck:     Musculoskeletal: Neck supple. No muscular tenderness.  Cardiovascular:     Rate and Rhythm: Normal rate and regular rhythm.     Pulses: Normal pulses.     Heart sounds: No murmur.  Pulmonary:     Effort: Pulmonary effort is normal. No respiratory distress.     Breath sounds: Normal breath sounds. No wheezing, rhonchi or rales.  Chest:     Chest wall: No tenderness.  Abdominal:     Palpations: Abdomen is soft.     Tenderness: There is  no abdominal tenderness. There is no right CVA tenderness or left CVA tenderness.  Musculoskeletal:        General: No tenderness.     Right lower leg: No edema.     Left lower leg: No edema.  Skin:    General: Skin is warm and dry.     Capillary Refill: Capillary refill takes less than 2 seconds.     Findings: No erythema.  Neurological:     Mental Status: He is alert.     GCS: GCS eye subscore is 4. GCS verbal subscore is 5. GCS motor subscore is 6.     Cranial Nerves: No cranial nerve deficit, dysarthria or facial asymmetry.     Sensory: No sensory deficit.     Motor: Weakness present.     Coordination: Coordination abnormal. Finger-Nose-Finger Test abnormal.     Comments: Weakness in left grip strength compared to right.  Ataxia with left finger-nose-finger testing compared to right.  Normal sensation throughout.  Clear speech.  Normal extraocular movements.  Normal pulses in extremities.  Psychiatric:        Mood and Affect: Mood normal.      ED Treatments / Results   Labs (all labs ordered are listed, but only abnormal results are displayed) Labs Reviewed  CBC - Abnormal; Notable for the following components:      Result Value   RBC 3.80 (*)    Hemoglobin 10.0 (*)    HCT 34.5 (*)    MCHC 29.0 (*)    RDW 19.9 (*)    All other components within normal limits  COMPREHENSIVE METABOLIC PANEL - Abnormal; Notable for the following components:   Creatinine, Ser 1.43 (*)    Albumin 3.2 (*)    GFR calc non Af Amer 43 (*)    GFR calc Af Amer 49 (*)    All other components within normal limits  SARS CORONAVIRUS 2  PROTIME-INR  APTT  DIFFERENTIAL  LIPID PANEL  HEMOGLOBIN A1C  I-STAT CHEM 8, ED  CBG MONITORING, ED    EKG EKG Interpretation  Date/Time:  Saturday November 02 2018 16:48:46 EDT Ventricular Rate:  77 PR Interval:    QRS Duration: 123 QT Interval:  413 QTC Calculation: 468 R Axis:   -25 Text Interpretation:  Sinus rhythm Atrial premature complexes Nonspecific intraventricular conduction delay When compared to prior, no significant changes seen.  No STEMI Confirmed by Antony Blackbird [phone]) on 11/02/2018 7:50:09 PM     Radiology Ct Head Wo Contrast  Result Date: 11/02/2018 CLINICAL DATA:  Left arm weakness. EXAM: CT HEAD WITHOUT CONTRAST TECHNIQUE: Contiguous axial images were obtained from the base of the skull through the vertex without intravenous contrast. COMPARISON:  None. FINDINGS: Brain: Similar appearance of diffuse atrophy and advanced chronic small vessel white matter disease. Calcified left frontal mass measured previously at 2.8 cm is stable, measuring 2.7 cm today. Imaging features remain compatible with calcified meningioma. Encephalomalacia in the right cerebellum is consistent with old infarct. The could or infarcts noted in the left cerebellum and basal ganglia. Vascular: No hyperdense vessel or unexpected calcification. Skull: No evidence for fracture. No worrisome lytic or sclerotic lesion. Sinuses/Orbits: The visualized  paranasal sinuses and mastoid air cells are clear. Visualized portions of the globes and intraorbital fat are unremarkable. Other: None. IMPRESSION: 1. Stable exam without new or acute interval finding. 2. Stable 2.7 cm calcified meningioma of the left sphenoid wing. 3. Atrophy with advanced chronic small vessel white matter ischemic disease.  4. Prominent encephalomalacia in the right cerebellum consistent with old infarct. Lacunar infarcts are seen in the cerebellar hemispheres bilaterally. Electronically Signed   By: Misty Stanley M.D.   On: 11/02/2018 14:23    Procedures Procedures (including critical care time)  Medications Ordered in ED Medications  sodium chloride flush (NS) 0.9 % injection 3 mL (3 mLs Intravenous Given 11/02/18 1348)     Initial Impression / Assessment and Plan / ED Course  I have reviewed the triage vital signs and the nursing notes.  Pertinent labs & imaging results that were available during my care of the patient were reviewed by me and considered in my medical decision making (see chart for details).        [name] is a 85 y.o. male with a past medical history significant for hypertension, CHF, prior cerebellar stroke with residual right-sided deficits, CKD, dementia, DVT and prior IVC filter who presents with new left-sided coordination and problems and weakness.  Patient accompanied by daughter who reports that this morning when she was helping the patient get ready, his left arm was not working like normal and he reported it was feeling weak and had difficulty with coordination of the left arm.  Daughter reports that normally it is his right arm and right leg but has coordination difficulties from prior stroke but is the first time he is ever had left-sided difficulty.  She reports has had no recent trauma, no fevers, chills, cough, urinary symptoms or GI symptoms.  Patient is at his mental status baseline is having no difficulty with vision or  speech.  On my exam, patient does have ataxia with left finger-nose-finger testing.  He has abnormality with right finger-nose-finger testing but they report this is at his baseline.  Normal sensation and strength in left face and left leg.  Patient did have slightly decreased grip strength on the left compared to right.  Lungs were clear and chest and abdomen were nontender.  Patient is alert.  Pupil exam unremarkable and patient has symmetric smile.  Clinical I am concerned patient may have had a stroke versus TIA causing the left upper extremity symptoms.  We will get a CT initially and then will touch base with neurology.  Will get rest of work-up.  Anticipate admission for likely stroke versus TIA.  3:32 PM CT scan shows no new abnormality.  Neurology was called who recommended MRI/MRA of the brain and admission to internal medicine teaching service for further management.  They will see the patient in consultation.  Patient to be admitted.  Final Clinical Impressions(s) / ED Diagnoses   Final diagnoses:  Ataxia    ED Discharge Orders    None     Clinical Impression: 1. Ataxia     Disposition: Admit  This note was prepared with assistance of Dragon voice recognition software. Occasional wrong-word or sound-a-like substitutions may have occurred due to the inherent limitations of voice recognition software.     Dua Mehler, Gwenyth Allegra, MD 11/02/18 1950

## 2018-11-02 NOTE — H&P (Signed)
Date: 11/02/2018               Patient Name:  [name] MRN: [id number]  DOB: 1924/07/18 Age / Sex: 85 y.o., male   PCP: [name], DO         Medical Service: Internal Medicine Teaching Service         Attending Physician: Dr. Rebeca Alert, Raynaldo Opitz, MD    First Contact: Charleen Kirks, MD, Albertine Grates Pager: [facility] [phone])  Second Contact: Tarri Abernethy, MD, Larkin Ina  Pager: [facility] [phone])       After Hours (After 5p/  First Contact Pager: [phone]  weekends / holidays): Second Contact Pager: [phone]   Chief Complaint: left arm weakness   History of Present Illness: 85 y.o. yo male w/ PMH significant for HTN, HFpEF, CVA with residual right sided deficits, DVT and prior IVC filter and CKD 3 presenting with <12 hours of left arm and leg weakness that patient reports "feels like a stroke". History obtained by patient's daughter at bedside. He has had trouble walking with his walker due to the left sided weakness. No facial droop noted by daughter. No headache, dizziness, slurred speech, changes in vision, difficulty swallowing or urinary/fecal incontinence.   In the ED, patient noted to have left upper extremity ataxia with decreased grip strength on left compared to right. CT head w/o contrast without new or acute interval findings. MRI/MRA ordered and patient admitted for further stroke work up.     Meds:  Current Meds  Medication Sig  . amLODipine (NORVASC) 5 MG tablet Take 1 tablet (5 mg total) by mouth daily.  Marland Kitchen atorvastatin (LIPITOR) 20 MG tablet Take 1 tablet (20 mg total) by mouth daily at 6 PM. (Patient taking differently: Take 20 mg by mouth daily. )  . furosemide (LASIX) 40 MG tablet Take 2 tablets (80 mg total) by mouth daily.    Allergies: Allergies as of 11/02/2018  . (No Known Allergies)   Past Medical History:  Diagnosis Date  . Anemia   . BPH (benign prostatic hypertrophy)    TURP 05/19/13  . Cerebral embolism with cerebral infarction ([place]) 12/19/2013  . Chronic  kidney disease    CHRONIC KIDNEY DISEASE, 2  . Difficulty hearing, right    BILATERAL HEARING LOSS - BEST TO TRY TO SPEAK INTO LEFT EAR  . DVT (deep venous thrombosis) ([place])   . Frequent falls   . History of DVT (deep vein thrombosis) 06/10/2013   Provoked s/p TURP. Dx per doppler 06/10/13. Complicated by hematuria while on anticoagulation s/p TURP. Initially on lovenox and coumadin, then stopped, then IVC filter placed 06/16/13. Anticoagulation restarted after hematuria stopped. Lovenox was discontinued apparently on 07/29/13 with comment on high risk for falls.   Total ~1.5 months of anticoagulation but interrupted with bleeding complication.  . Hyperlipidemia   . Hypertension   . Incontinence of urine    SOME INCONTINENCE  . Left adrenal mass ([place]) 09/11/2013  . Meningioma ([place])   . Stroke ([place])    Cerebellar, 2013; WALKS WITH WALKER, ABLE TO DRESS AND BATHE HIMSELF BUT FAMILY TRIES TO PROVIDE SUPERVISION BECAUSE OF HIS HX OF FALL AND WEAKNESS LEGS, ARMS   . Thrombocytopenia ([place])   . Vitreous hemorrhage ([place]) 12/21/2013   OS 12/18/13 CT /MRI brain done for ataxia     Family History:  Family History  Problem Relation Age of Onset  . Diabetes Mother   . Heart disease Mother   . Hypertension Mother   .  Heart disease Father   . Hypertension Father   . Hypertension Daughter      Social History:  Social History   Tobacco Use  . Smoking status: Never Smoker  . Smokeless tobacco: Never Used  Substance Use Topics  . Alcohol use: No    Alcohol/week: 0.0 standard drinks  . Drug use: No     Review of Systems: A complete ROS was negative except as per HPI.  Physical Exam: Blood pressure (!) 143/87, pulse 71, temperature 98.8 F (37.1 C), temperature source Oral, resp. rate 20, height 5\' 7"  (1.702 m), weight 97.5 kg, SpO2 96 %.   Physical Exam Constitutional:      General: He is not in acute distress.    Appearance: Normal appearance.  HENT:     Head: Normocephalic and  atraumatic.     Mouth/Throat:     Mouth: Mucous membranes are moist.     Pharynx: Oropharynx is clear.  Eyes:     General: No scleral icterus.    Extraocular Movements: Extraocular movements intact.     Conjunctiva/sclera: Conjunctivae normal.     Pupils: Pupils are equal, round, and reactive to light.  Cardiovascular:     Rate and Rhythm: Normal rate and regular rhythm.     Heart sounds: No murmur. No friction rub. No gallop.   Pulmonary:     Effort: Pulmonary effort is normal. No respiratory distress.     Breath sounds: Normal breath sounds. No wheezing, rhonchi or rales.  Chest:     Chest wall: No tenderness.  Abdominal:     General: Bowel sounds are normal. There is no distension.     Palpations: Abdomen is soft.     Tenderness: There is no abdominal tenderness. There is no guarding.  Musculoskeletal: Normal range of motion.        General: No swelling, tenderness, deformity or signs of injury.  Skin:    General: Skin is warm and dry.     Findings: No bruising, erythema or lesion.  Neurological:     Mental Status: He is alert and oriented to person, place, and time. Mental status is at baseline.     Cranial Nerves: No cranial nerve deficit or facial asymmetry.     Sensory: Sensation is intact. No sensory deficit.     Motor: Motor function is intact. No weakness, tremor, atrophy or abnormal muscle tone.     Coordination: Finger-Nose-Finger Test abnormal.     Comments: strength 4/5 bilateral upper extremity Abnormal finger to nose test of left arm       EKG: personally reviewed my interpretation is sinus rhythm with nonspecific intraventricular conduction delay unchanged from previous exam.   CT HEAD WO CONTRAST: IMPRESSION: 1. Stable exam without new or acute interval finding. 2. Stable 2.7 cm calcified meningioma of the left sphenoid wing. 3. Atrophy with advanced chronic small vessel white matter ischemic disease. 4. Prominent encephalomalacia in the right cerebellum  consistent with old infarct. Lacunar infarcts are seen in the cerebellar hemispheres bilaterally.  MRI HEAD AND NECK WO CONTRAST:  IMPRESSION: Brain MRI: 1. Small acute or subacute infarcts along the right posterior frontal and right occipital cortex. 2. Severe chronic ischemic injury with multiple remote micro hemorrhages in a hypertensive pattern. 3. 2.7 cm meningioma in the left anterior cranial fossa with chronic vasogenic edema. 4. Remote bilateral cerebellar infarction, extensive on the right.  Intracranial MRA: Negative  Neck MRA: Generalized vessel tortuosity without stenosis or ulceration.  Assessment & Plan  by Problem: Active Problems:   Focal neurological deficit  Patient is 85yo male with PMHx of HTN and CVA with right sided residual deficits presenting with left sided weakness that patient reported felt like prior stroke. MRI brain consistent with small acute/subacute infarcts along right posterior frontal and occipital cortex.   L sided weakness 2/2 stroke:  Patient presenting with <12 hours of left arm and leg weakness that patient reports "feels like a stroke".He has had trouble walking with his walker due to the left sided weakness. No facial droop noted by daughter. On examination, abnormal finger to nose test of left arm with mild left sided weakness, equivalent to strength on right side. MRI brain with small acute/subacute infarcts of right posterior frontal and right occipital cortex. Neurology consulted.   - Permissive hypertension up to 220/110 - NPO pending stroke evaluation  - PT/OT evaluation - Speech evaluation - Cardiac monitoring and frequent neuro checks  - ASA, Statin - Echo  Hypertension/HFpEF:  Patient on amlodipine 5mg  and Lasix 40mg  qd - Holding home meds   Hyperlipidemia: Patient on atorvastatin 20mg  qd.  - Continue Rosuvastatin 20mg  qd  - Lipid panel  CKD3:  BUN/Cr 17/1.43 with GFR 49 on admission. Stable. No significant changes noted  on labs. CMP in AM.  - Continue to monitor   Anemia:  Hb 10, MCV 90.8. Iron profile 7/29: Fe 17, TIBC 377, Ferritin 16 consistent with iron deficiency anemia.  CBC in AM. - Continue to monitor    DVT Prophylaxis: Lovenox Diet: NPO pending stroke eval Code: DNR  Dispo: Admit patient to Inpatient with expected length of stay greater than 2 midnights.  Signed: Harvie Heck, MD  Internal Medicine, PGY-1 11/02/2018, 7:19 PM  Pager: [phone]

## 2018-11-02 NOTE — ED Triage Notes (Signed)
Pt accompanied by family member, states he had some trouble using his left arm this morning when getting ready. Speech normal per family. Pt alert, oriented at baseline.

## 2018-11-02 NOTE — Telephone Encounter (Signed)
This morning daughter getting pt dressed and he began complaining of acute left arm weakness with arm drop could not hold objects and worsened left leg weakness, she reports no facial droop, no vision changes.  Pt stating to her "I've had another stroke" Able to walk with a walker still but more difficult due to left leg weakness.  They are currently on their way to the ED for evaluation.

## 2018-11-02 NOTE — Consult Note (Addendum)
NEURO HOSPITALIST  CONSULT   Requesting Physician: Dr. Rebeca Alert    Chief Complaint: left arm weakness  History obtained from:  Patient     HPI:                                                                                                                                         BIGE BARRIENTOS is an 85 y.o. 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 He had sudden left arm and leg weakness.patient is very hard of hearing ( with hearing aid) and he is unreliable historian.  Denies slurred speech or blurred vision. Had trouble walking with walker d/t weakness.  ED course:  CTH: no hemorrhage, stable 2.7cm meningioma, lacunar infarcts in bilateral cerebellum.   BP : 133/74 BG: 88  12/2013: left occipital pole, left PCA territory chronic infarct 2013: cerebellar stroke with residual imbalance  Date last known well:11/02/18 Time last known well: 0930 tPA Given:no; outside of window Modified Rankin: Rankin Score=3 NIHSS:2; ataxia, questions   Past Medical History:  Diagnosis Date  . Anemia   . BPH (benign prostatic hypertrophy)    TURP 05/19/13  . Cerebral embolism with cerebral infarction ([place]) 12/19/2013  . Chronic kidney disease    CHRONIC KIDNEY DISEASE, 2  . Difficulty hearing, right    BILATERAL HEARING LOSS - BEST TO TRY TO SPEAK INTO LEFT EAR  . DVT (deep venous thrombosis) ([place])   . Frequent falls   . History of DVT (deep vein thrombosis) 06/10/2013   Provoked s/p TURP. Dx per doppler 06/10/13. Complicated by hematuria while on anticoagulation s/p TURP. Initially on lovenox and coumadin, then stopped, then IVC filter placed 06/16/13. Anticoagulation restarted after hematuria stopped. Lovenox was discontinued apparently on 07/29/13 with comment on high risk for falls.   Total ~1.5 months of anticoagulation but interrupted with bleeding  complication.  . Hyperlipidemia   . Hypertension   . Incontinence of urine    SOME INCONTINENCE  . Left adrenal mass ([place]) 09/11/2013  . Meningioma ([place])   . Stroke ([place])    Cerebellar, 2013; WALKS WITH WALKER, ABLE TO DRESS AND BATHE HIMSELF BUT FAMILY TRIES TO PROVIDE SUPERVISION BECAUSE OF HIS HX OF FALL AND WEAKNESS LEGS, ARMS   . Thrombocytopenia ([place])   . Vitreous hemorrhage ([place]) 12/21/2013   OS 12/18/13 CT /MRI brain done for ataxia     Past Surgical History:  Procedure Laterality Date  . CYSTOSCOPY N/A 06/13/2013   Procedure: CYSTOSCOPY FLEXIBLE BEDSIDE;  Surgeon: Ardis Hughs, MD;  Location: [facility]  OR;  Service: Urology;  Laterality: N/A;  . MYRINGOTOMY WITH TUBE PLACEMENT Bilateral   . TRANSURETHRAL RESECTION OF PROSTATE N/A 05/19/2013   Procedure: TRANSURETHRAL RESECTION OF THE PROSTATE WITH GYRUS INSTRUMENTS;  Surgeon: Ailene Rud, MD;  Location: [facility] ORS;  Service: Urology;  Laterality: N/A;    Family History  Problem Relation Age of Onset  . Diabetes Mother   . Heart disease Mother   . Hypertension Mother   . Heart disease Father   . Hypertension Father   . Hypertension Daughter          Social History:  reports that he has never smoked. He has never used smokeless tobacco. He reports that he does not drink alcohol or use drugs.  Allergies: No Known Allergies  Medications:                                                                                                                           Current Facility-Administered Medications  Medication Dose Route Frequency Provider Last Rate Last Dose  .  stroke: mapping our early stages of recovery book   Does not apply Once Guadlupe Spanish B, MD      . 0.9 %  sodium chloride infusion   Intravenous Continuous Katherine Roan, MD      . acetaminophen (TYLENOL) tablet 650 mg  650 mg Oral Q4H PRN Katherine Roan, MD       Or  . acetaminophen (TYLENOL) solution 650 mg  650 mg Per Tube Q4H PRN Katherine Roan, MD       Or  . acetaminophen (TYLENOL) suppository 650 mg  650 mg Rectal Q4H PRN Katherine Roan, MD      . enoxaparin (LOVENOX) injection 40 mg  40 mg Subcutaneous Q24H Katherine Roan, MD      . senna-docusate (Senokot-S) tablet 1 tablet  1 tablet Oral QHS PRN Katherine Roan, MD       Current Outpatient Medications  Medication Sig Dispense Refill  . amLODipine (NORVASC) 5 MG tablet Take 1 tablet (5 mg total) by mouth daily. 90 tablet 3  . atorvastatin (LIPITOR) 20 MG tablet Take 1 tablet (20 mg total) by mouth daily at 6 PM. (Patient taking differently: Take 20 mg by mouth daily. ) 90 tablet 3  . furosemide (LASIX) 40 MG tablet Take 2 tablets (80 mg total) by mouth daily. 30 tablet 2     ROS:  ROS was performed and is negative except as noted in HPI    General Examination:                                                                                                      Blood pressure 132/86, pulse 81, temperature 98.8 F (37.1 C), temperature source Oral, resp. rate (!) 21, height 5\' 7"  (1.702 m), weight 97.5 kg, SpO2 98 %.  HEENT-  Normocephalic, no lesions, without obvious abnormality.  Normal external eye and conjunctiva. Cardiovascular- , pulses palpable throughout  Lungs-no rhonchi or wheezing noted, no excessive working breathing.  Saturations within normal limits on [place] Extremities- Warm, dry and intact Musculoskeletal-no joint tenderness, deformity or swelling Skin-warm and dry, no hyperpigmentation, vitiligo, or suspicious lesions  Neurological Examination Mental Status: Alert, oriented, thought content appropriate.  Speech fluent without evidence of aphasia.  Able to follow commands without difficulty. Cranial Nerves: II:  Visual fields grossly normal,  III,IV, VI: ptosis not present, extra-ocular motions intact bilaterally,  pupils equal, round, reactive to light and accommodation V,VII: smile symmetric, facial light touch sensation normal bilaterally VIII: bilateral hearing loss; hard of hearing with hearing aid. IX,X: uvula rises midline XI: bilateral shoulder shrug XII: midline tongue extension Motor: Right : Upper extremity   5/5  Left:     Upper extremity   5/5  Lower extremity   5/5   Lower extremity   5/5 Tone and bulk:normal tone throughout; no atrophy noted Sensory: Pinprick and light touch intact throughout, bilaterally Deep Tendon Reflexes: 2+ and symmetric biceps and patella Cerebellar: Ataxic FNF RUE.   Gait: deferred   Lab Results: Basic Metabolic Panel: Recent Labs  Lab 11/02/18 1327  NA 140  K 3.7  CL 100  CO2 31  GLUCOSE 98  BUN 17  CREATININE 1.43*  CALCIUM 9.4    CBC: Recent Labs  Lab 11/02/18 1327  WBC 8.4  NEUTROABS 6.1  HGB 10.0*  HCT 34.5*  MCV 90.8  PLT 160   CBG: Recent Labs  Lab 11/02/18 1347  GLUCAP 88    Imaging: Ct Head Wo Contrast  Result Date: 11/02/2018 CLINICAL DATA:  Left arm weakness. EXAM: CT HEAD WITHOUT CONTRAST TECHNIQUE: Contiguous axial images were obtained from the base of the skull through the vertex without intravenous contrast. COMPARISON:  None. FINDINGS: Brain: Similar appearance of diffuse atrophy and advanced chronic small vessel white matter disease. Calcified left frontal mass measured previously at 2.8 cm is stable, measuring 2.7 cm today. Imaging features remain compatible with calcified meningioma. Encephalomalacia in the right cerebellum is consistent with old infarct. The could or infarcts noted in the left cerebellum and basal ganglia. Vascular: No hyperdense vessel or unexpected calcification. Skull: No evidence for fracture. No worrisome lytic or sclerotic lesion. Sinuses/Orbits: The visualized paranasal sinuses and mastoid air cells are clear. Visualized portions of the globes and intraorbital fat are unremarkable. Other:  None. IMPRESSION: 1. Stable exam without new or acute interval finding. 2. Stable 2.7 cm calcified meningioma of the left sphenoid wing. 3. Atrophy with advanced chronic small vessel white  matter ischemic disease. 4. Prominent encephalomalacia in the right cerebellum consistent with old infarct. Lacunar infarcts are seen in the cerebellar hemispheres bilaterally. Electronically Signed   By: Misty Stanley M.D.   On: 11/02/2018 14:23       Laurey Morale, MSN, NP-C [facility] [phone]  11/02/2018, 4:03 PM   Attending physician note to follow with Assessment and plan .   Assessment: 85 y.o. male is an 85 y.o. male with PMH, CVA( 2013 cerebellar; residual balance problems), meningioma, HTN, HLD, DVT, cerebral embolism ( 12/2013). CTH: no hemorrhage. MRI/MRA: pending. TPA not given d/t presenting outside the window. Admit for stroke work-up.   Stroke Risk Factors - hyperlipidemia and hypertension    Recommendations: -- BP goal : Permissive HTN upto 220/110 mmHg --MRI Brain  --MRA of the head w/o and neck with contrast  --Echocardiogram -- ASA -- High intensity Statin  -- HgbA1c, fasting lipid panel -- PT consult, OT consult, Speech consult --Telemetry monitoring --Frequent neuro checks --Stroke swallow screen      --please page stroke NP  Or  PA  Or MD from 8am -4 pm  as this patient from this time will be  followed by the stroke.   You can look them up on [email or web address]  Password [email or web address]   NEUROHOSPITALIST ADDENDUM Performed a face to face diagnostic evaluation.   I have reviewed the contents of history and physical exam as documented by PA/ARNP/Resident and agree with above documentation.  I have discussed and formulated the above plan as documented. Edits to the note have been made as needed.  Patient presented with left-sided weakness.  Has prior CVA with right residual deficits (hardly noticeable) and now presents with sudden onset left arm and leg weakness.   MRI brain performed shows small subacute infarct in the right posterior frontal and right occipital lobe "cortex.  Also has old bilateral cerebellar infarctions.  MR angiogram negative for any intracranial stenosis/occlusion.  Etiology of stroke is ESUS (embolic strokes of undetermined source): Suspect cardioembolic due to paroxysmal A. fib given his age.  Continue aspirin and Plavix 3 weeks and then aspirin alone, obtain remaining stroke work-up including echo. discharge on 30 Day Loop monitor.  Stroke team to follow: May be a candidate for Remi Haggard although given his old age not an ideal trial candidate.   Karena Addison Aroor MD [facility] [id number]   If 7pm to 7am, please call on call as listed on AMION.

## 2018-11-03 ENCOUNTER — Inpatient Hospital Stay (HOSPITAL_COMMUNITY): Payer: Medicare HMO

## 2018-11-03 DIAGNOSIS — I69351 Hemiplegia and hemiparesis following cerebral infarction affecting right dominant side: Secondary | ICD-10-CM

## 2018-11-03 DIAGNOSIS — Z8673 Personal history of transient ischemic attack (TIA), and cerebral infarction without residual deficits: Secondary | ICD-10-CM

## 2018-11-03 DIAGNOSIS — I10 Essential (primary) hypertension: Secondary | ICD-10-CM

## 2018-11-03 DIAGNOSIS — I639 Cerebral infarction, unspecified: Secondary | ICD-10-CM

## 2018-11-03 DIAGNOSIS — Z79899 Other long term (current) drug therapy: Secondary | ICD-10-CM

## 2018-11-03 DIAGNOSIS — I6340 Cerebral infarction due to embolism of unspecified cerebral artery: Secondary | ICD-10-CM | POA: Diagnosis present

## 2018-11-03 DIAGNOSIS — I63531 Cerebral infarction due to unspecified occlusion or stenosis of right posterior cerebral artery: Secondary | ICD-10-CM

## 2018-11-03 DIAGNOSIS — E785 Hyperlipidemia, unspecified: Secondary | ICD-10-CM

## 2018-11-03 DIAGNOSIS — I5032 Chronic diastolic (congestive) heart failure: Secondary | ICD-10-CM

## 2018-11-03 DIAGNOSIS — I82501 Chronic embolism and thrombosis of unspecified deep veins of right lower extremity: Secondary | ICD-10-CM

## 2018-11-03 DIAGNOSIS — I130 Hypertensive heart and chronic kidney disease with heart failure and stage 1 through stage 4 chronic kidney disease, or unspecified chronic kidney disease: Secondary | ICD-10-CM

## 2018-11-03 DIAGNOSIS — G8194 Hemiplegia, unspecified affecting left nondominant side: Secondary | ICD-10-CM

## 2018-11-03 DIAGNOSIS — D649 Anemia, unspecified: Secondary | ICD-10-CM

## 2018-11-03 DIAGNOSIS — R319 Hematuria, unspecified: Secondary | ICD-10-CM

## 2018-11-03 DIAGNOSIS — D631 Anemia in chronic kidney disease: Secondary | ICD-10-CM

## 2018-11-03 DIAGNOSIS — N183 Chronic kidney disease, stage 3 (moderate): Secondary | ICD-10-CM

## 2018-11-03 DIAGNOSIS — R269 Unspecified abnormalities of gait and mobility: Secondary | ICD-10-CM

## 2018-11-03 DIAGNOSIS — I5030 Unspecified diastolic (congestive) heart failure: Secondary | ICD-10-CM

## 2018-11-03 LAB — LIPID PANEL
Cholesterol: 109 mg/dL (ref 0–200)
HDL: 46 mg/dL (ref >40)
LDL Cholesterol: 54 mg/dL (ref 0–99)
Total CHOL/HDL Ratio: 2.4 RATIO
Triglycerides: 44 mg/dL (ref <150)
VLDL: 9 mg/dL (ref 0–40)

## 2018-11-03 LAB — COMPREHENSIVE METABOLIC PANEL
ALT: 12 U/L (ref 0–44)
AST: 18 U/L (ref 15–41)
Albumin: 2.8 g/dL — ABNORMAL LOW (ref 3.5–5.0)
Alkaline Phosphatase: 43 U/L (ref 38–126)
Anion gap: 7 (ref 5–15)
BUN: 19 mg/dL (ref 8–23)
CO2: 32 mmol/L (ref 22–32)
Calcium: 9.2 mg/dL (ref 8.9–10.3)
Chloride: 99 mmol/L (ref 98–111)
Creatinine, Ser: 1.56 mg/dL — ABNORMAL HIGH (ref 0.61–1.24)
GFR calc Af Amer: 44 mL/min — ABNORMAL LOW (ref >60)
GFR calc non Af Amer: 38 mL/min — ABNORMAL LOW (ref >60)
Glucose, Bld: 104 mg/dL — ABNORMAL HIGH (ref 70–99)
Potassium: 3.5 mmol/L (ref 3.5–5.1)
Sodium: 138 mmol/L (ref 135–145)
Total Bilirubin: 0.5 mg/dL (ref 0.3–1.2)
Total Protein: 5.9 g/dL — ABNORMAL LOW (ref 6.5–8.1)

## 2018-11-03 LAB — HEMOGLOBIN A1C
Hgb A1c MFr Bld: 5.5 % (ref 4.8–5.6)
Mean Plasma Glucose: 111.15 mg/dL

## 2018-11-03 LAB — CBC
HCT: 31.4 % — ABNORMAL LOW (ref 39.0–52.0)
Hemoglobin: 9.1 g/dL — ABNORMAL LOW (ref 13.0–17.0)
MCH: 26.2 pg (ref 26.0–34.0)
MCHC: 29 g/dL — ABNORMAL LOW (ref 30.0–36.0)
MCV: 90.5 fL (ref 80.0–100.0)
Platelets: 147 10*3/uL — ABNORMAL LOW (ref 150–400)
RBC: 3.47 MIL/uL — ABNORMAL LOW (ref 4.22–5.81)
RDW: 20 % — ABNORMAL HIGH (ref 11.5–15.5)
WBC: 6.7 10*3/uL (ref 4.0–10.5)
nRBC: 0 % (ref 0.0–0.2)

## 2018-11-03 MED ORDER — ATORVASTATIN CALCIUM 10 MG PO TABS: 20.0000 mg | ORAL_TABLET | Freq: Every day | ORAL | Status: DC

## 2018-11-03 MED ORDER — CLOPIDOGREL BISULFATE 75 MG PO TABS
75.0000 mg | ORAL_TABLET | Freq: Every day | ORAL | Status: DC
  Administered 2018-11-03 – 2018-11-04 (×2): 75 mg via ORAL
  Filled 2018-11-03 (×2): qty 1

## 2018-11-03 NOTE — Progress Notes (Signed)
  Date: 11/03/2018  Patient name: [name]  Medical record number: [id number]  Date of birth: [birth year]   I have seen and evaluated this patient and I have discussed the plan of care with the house staff. Please see their note for complete details. I concur with their findings.  Lenice Pressman, M.D., Ph.D. 11/03/2018, 5:50 PM

## 2018-11-03 NOTE — Evaluation (Addendum)
Physical Therapy Evaluation Patient Details Name: [name] MRN: [id number] DOB: [birth year] Today's Date: 11/03/2018   History of Present Illness   85 y.o. 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  Clinical Impression  Orders received for PT evaluation. Patient demonstrates deficits in functional mobility as indicated below. Will benefit from continued skilled PT to address deficits and maximize function. Will see as indicated and progress as tolerated.  At this time, patient requires min assist for mobility and demonstrates good insight into deficits while mobilizing. Patient is cautious and intention. Anticipate patient would be safe for d/c home with family assist. Recommend HHPT upon discharge.    Follow Up Recommendations Home health PT;Supervision/Assistance - 24 hour    Equipment Recommendations  None recommended by PT    Recommendations for Other Services       Precautions / Restrictions Precautions Precautions: Fall Precaution Comments: Strong R lateral lean. Restrictions Weight Bearing Restrictions: No      Mobility  Bed Mobility Overal bed mobility: Needs Assistance Bed Mobility: Supine to Sit     Supine to sit: HOB elevated;Min guard     General bed mobility comments: No physical assist required for mobility or movement to EOB, increased time and effort. Cues to scoot and reposition   Transfers Overall transfer level: Needs assistance Equipment used: Rolling walker (2 wheeled) Transfers: Sit to/from [name] Sit to Stand: Min assist;+2 physical assistance         General transfer comment: +2 physical assist to initiate power up from lower heaight surface. Increased time and effort with initial posterior list. Tactile cues to reposition and correct COG over BOS. [place] on UE support. Well controlled descent into  chair.  Ambulation/Gait Ambulation/Gait assistance: Min assist Gait Distance (Feet): 8 Feet Assistive device: Rolling walker (2 wheeled) Gait Pattern/deviations: Step-to pattern;Step-through pattern;Decreased stride length;Trunk flexed Gait velocity: decreased   General Gait Details: Posterolateral lean to R.  Shuffling steps with assistance to maintain stance. Increased tiem and effort. Min assist for stability. Good overall awareness of safety and positioning despite proprioceptive deficits.  Stairs            Wheelchair Mobility    Modified Rankin (Stroke Patients Only)       Balance Overall balance assessment: Needs assistance Sitting-balance support: Feet supported Sitting balance-Leahy Scale: Fair     Standing balance support: Bilateral upper extremity supported;During functional activity Standing balance-Leahy Scale: Poor Standing balance comment: Initial posterior list requiring tactile cues to center BOS.                             Pertinent Vitals/Pain Faces Pain Scale: No hurt    Home Living Family/patient expects to be discharged to:: Private residence Living Arrangements: Children Available Help at Discharge: Family;Available 24 hours/day Type of Home: House Home Access: Ramped entrance     Home Layout: One level Home Equipment: Walker - 2 wheels;Bedside commode;Shower seat;Wheelchair - manual;Hospital bed      Prior Function Level of Independence: Needs assistance   Gait / Transfers Assistance Needed: sometimes needed A with sit - stand  ADL's / Homemaking Assistance Needed: could feed himself; A with bathing dressing  Comments: Difficulty with vision at baseline     Hand Dominance   Dominant Hand: Right    Extremity/Trunk Assessment   Upper Extremity Assessment Upper Extremity Assessment: Defer to OT  evaluation RUE Coordination: decreased gross motor;decreased fine motor    Lower Extremity Assessment Lower Extremity  Assessment: RLE deficits/detail;LLE deficits/detail RLE Deficits / Details: Noted RLE deficits in functional task performance, modest assymetrical weakness, RLE lag during ambulation, with limited ability to move and place limb. RLE Sensation: decreased light touch;decreased proprioception RLE Coordination: decreased fine motor;decreased gross motor    Cervical / Trunk Assessment Cervical / Trunk Assessment: Other exceptions(forward head)  Communication   Communication: HOH;Expressive difficulties  Cognition Arousal/Alertness: Awake/alert Behavior During Therapy: WFL for tasks assessed/performed Overall Cognitive Status: Difficult to assess                                 General Comments: most likely close to baseline; able to follow gestural cues      General Comments      Exercises     Assessment/Plan    PT Assessment Patient needs continued PT services  PT Problem List Decreased strength;Decreased activity tolerance;Decreased balance;Decreased mobility;Decreased knowledge of use of DME;Decreased safety awareness       PT Treatment Interventions DME instruction;Gait training;Functional mobility training;Therapeutic activities;Therapeutic exercise;Balance training;Neuromuscular re-education;Patient/family education    PT Goals (Current goals can be found in the Care Plan section)  Acute Rehab PT Goals Patient Stated Goal: to go home PT Goal Formulation: With patient/family Time For Goal Achievement: 10/22/18 Potential to Achieve Goals: Fair    Frequency Min 3X/week   Barriers to discharge        Co-evaluation PT/OT/SLP Co-Evaluation/Treatment: Yes Reason for Co-Treatment: For patient/therapist safety PT goals addressed during session: Mobility/safety with mobility OT goals addressed during session: ADL's and self-care       AM-PAC PT "6 Clicks" Mobility  Outcome Measure Help needed turning from your back to your side while in a flat bed without  using bedrails?: A Little Help needed moving from lying on your back to sitting on the side of a flat bed without using bedrails?: A Little Help needed moving to and from a bed to a chair (including a wheelchair)?: A Lot Help needed standing up from a chair using your arms (e.g., wheelchair or bedside chair)?: A Lot Help needed to walk in hospital room?: A Lot Help needed climbing 3-5 steps with a railing? : A Lot 6 Click Score: 14    End of Session Equipment Utilized During Treatment: Gait belt;Oxygen Activity Tolerance: Patient tolerated treatment well Patient left: in chair;with call bell/phone within reach;with chair alarm set Nurse Communication: Mobility status PT Visit Diagnosis: Unsteadiness on feet (R26.81);Other abnormalities of gait and mobility (R26.89);Muscle weakness (generalized) (M62.81)    Time: [id number] PT Time Calculation (min) (ACUTE ONLY): 20 min  Charges:   PT Evaluation $PT Eval Moderate Complexity: 1 Mod          Alben Deeds, PT DPT  Board Certified Neurologic Specialist Acute Rehabilitation Services Pager [phone] Office [phone]   [name] 11/03/2018, 12:48 PM

## 2018-11-03 NOTE — Plan of Care (Signed)
Patient consumed 100% of breakfast meal, tolerated it well.

## 2018-11-03 NOTE — Progress Notes (Signed)
   Subjective: [name] is doing well this morning. He feels that his weakness is significantly improved. He was able to eat this morning. He is asking when he can go home.   We discussed the results of his MRI and that we will need to obtain some more tests. We will also have him work with PT/OT. He voices understanding. All questions and concerns addressed.  Objective: Vital signs in last 24 hours: Vitals:   11/02/18 2330 11/03/18 0130 11/03/18 0429 11/03/18 0819  BP: 113/65 106/67 108/63 (!) 96/53  Pulse: 71 82 78 62  Resp: 18 18 17 17   Temp: 98.7 F (37.1 C) 98.9 F (37.2 C) 98.7 F (37.1 C) 99.1 F (37.3 C)  TempSrc: Oral Oral Oral Oral  SpO2: 97% 96% 95% 94%  Weight:      Height:       General: Well nourished male in no acute distress CV: Irregular rhythm, no murmurs, no rubs  Neuro: Alert and oriented, cranial nerves intact bilaterally, gross strength 4/5 in the bilateral upper extremities and lower extremities.  Assessment/Plan:  Active Problems:   Focal neurological deficit  #Small acute/subacute infarcts of right posterior frontal and right occipital cortex:  Patient is 85yo male with PMHx of HTN and CVA with right sided residual deficits presenting with left sided weakness that patient reported felt like prior stroke. MRI brain consistent with small acute/subacute infarcts along right posterior frontal and occipital cortex. He continues to have left arm and leg weakness. Neurology is onboard and recommending stroke work-up with echocardiogram and loop recorder on discharge.   - ASA + Plavix for 3 weeks then ASA alone per neurology recs - Echocardiogram with bubble study  - Loop recorder on discharge  - PT / OT / Speech eval and treat  - Allow for permissive HTN ( <220/110) - Appreciate neurology recs  # Hypertension/HFpEF:  Patient presenting with new acute CVA. Will allow for permissive HTN over next 24-48 hours (<220/110). He was on amlodipine 5mg  and Lasix  40mg  qd as an outpatient.   - Holding home meds   # Hyperlipidemia: Patient on atorvastatin 20mg  qd.   - Continue Rosuvastatin 20mg  qd   # CKD3:  Renal function stable. No significant changes noted on labs.   - Continue to monitor   #Anemia:  Hgb stable compared to prior. Previous iron panel: Fe 17, TIBC 377, Ferritin 16 consistent with iron deficiency anemia.   - Continue to monitor   Dispo: Anticipated discharge once stroke work-up complete and patient has been evaluated by PT/OT.   Ina Homes, MD 11/03/2018, 10:18 AM

## 2018-11-03 NOTE — Progress Notes (Signed)
VASCULAR LAB PRELIMINARY  PRELIMINARY  PRELIMINARY  PRELIMINARY  Bilateral lower extremity venous duplex completed.    Preliminary report:  See CV proc of preliminary results.   Elenor Wildes, RVT 11/03/2018, 1:34 PM

## 2018-11-03 NOTE — Evaluation (Signed)
Occupational Therapy Evaluation Patient Details Name: [name] MRN: [id number] DOB: January 03, 1925 Today's Date: 11/03/2018    History of Present Illness  85 y.o. 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 MRI reveals "Small acute or subacute infarcts along the right posterior frontal and right occipital cortex".    Clinical Impression   Patient admitted for above and limited by problem list below, including impaired balance, decreased coordination, generalized weakness, and dysarthria.  Patient history retrieved from chart review, confirmed by patient (see below); he reports using RW for mobility and needing some assist with ADLs, his daughter assisting 24/7.  He currently requires min assist +2 for transfers, mod assist for UB ADLs, max-total assist for LB ADLs and setup to min assist for grooming.  Noted baseline R sided coordination deficits and impaired vision.  He is HOH, but able to follow 1 step commands given increased time.  He will benefit from continued OT services while admitted and after dc at [facility] level in order to maximize independence with ADLs/transfesr in order to decrease burden of care.     Follow Up Recommendations  Home health OT;Supervision/Assistance - 24 hour    Equipment Recommendations  None recommended by OT    Recommendations for Other Services       Precautions / Restrictions Precautions Precautions: Fall Precaution Comments: Strong R lateral lean. Restrictions Weight Bearing Restrictions: No      Mobility Bed Mobility Overal bed mobility: Needs Assistance Bed Mobility: Supine to Sit     Supine to sit: HOB elevated;Min guard     General bed mobility comments: No physical assist required for mobility or movement to EOB, increased time and effort. Cues to scoot and reposition   Transfers Overall transfer level: Needs assistance Equipment used:  Rolling walker (2 wheeled) Transfers: Sit to/from [name] Sit to Stand: Min assist;+2 physical assistance         General transfer comment: +2 physical assist to initiate power up from lower height surface. Increased time and effort with initial posterior list. Tactile cues to reposition and correct COG over BOS. [place] on UE support. Well controlled descent into chair.    Balance Overall balance assessment: Needs assistance Sitting-balance support: Feet supported Sitting balance-Leahy Scale: Fair     Standing balance support: Bilateral upper extremity supported;During functional activity Standing balance-Leahy Scale: Poor Standing balance comment: Initial posterior list requiring tactile cues to center BOS.; relaint on BUE support in standing                            ADL either performed or assessed with clinical judgement   ADL Overall ADL's : Needs assistance/impaired     Grooming: Wash/dry hands;Wash/dry face;Set up;Sitting   Upper Body Bathing: Minimal assistance;Sitting   Lower Body Bathing: Moderate assistance;Sit to/from stand   Upper Body Dressing : Moderate assistance;Sitting   Lower Body Dressing: Moderate assistance;Sit to/from stand   Toilet Transfer: +2 for safety/equipment;+2 for physical assistance;Minimal assistance;Ambulation;RW   Toileting- Clothing Manipulation and Hygiene: Moderate assistance;Sit to/from stand       Functional mobility during ADLs: Minimal assistance;+2 for physical assistance;+2 for safety/equipment;Rolling walker General ADL Comments: pt limited by balance, weakness      Vision   Additional Comments: further assessment required, noted visual deficits at baseline      Perception     Praxis  Pertinent Vitals/Pain Pain Assessment: Faces Faces Pain Scale: No hurt     Hand Dominance Right   Extremity/Trunk Assessment Upper Extremity Assessment Upper Extremity Assessment: RUE  deficits/detail;LUE deficits/detail RUE Deficits / Details: residual R hemiparesis, grossly functional during ADLs but poor coordination  RUE Coordination: decreased gross motor;decreased fine motor LUE Deficits / Details: grossly 3+/5, functional use but poor coordination  LUE Coordination: decreased fine motor;decreased gross motor   Lower Extremity Assessment Lower Extremity Assessment: Defer to PT evaluation RLE Deficits / Details: Noted RLE deficits in functional task performance, modest assymetrical weakness, RLE lag during ambulation, with limited ability to move and place limb. RLE Sensation: decreased light touch;decreased proprioception RLE Coordination: decreased fine motor;decreased gross motor   Cervical / Trunk Assessment Cervical / Trunk Assessment: Other exceptions(forward head)   Communication Communication Communication: HOH;Expressive difficulties   Cognition Arousal/Alertness: Awake/alert Behavior During Therapy: WFL for tasks assessed/performed Overall Cognitive Status: Difficult to assess                                 General Comments: most likely close to baseline; able to follow verbal and gestural cueing   General Comments       Exercises     Shoulder Instructions      Home Living Family/patient expects to be discharged to:: Private residence Living Arrangements: Children Available Help at Discharge: Family;Available 24 hours/day Type of Home: House Home Access: Ramped entrance     Home Layout: One level     Bathroom Shower/Tub: Tub/shower unit;Curtain   Bathroom Toilet: Standard Bathroom Accessibility: Yes   Home Equipment: Environmental consultant - 2 wheels;Bedside commode;Shower seat;Wheelchair - manual;Hospital bed          Prior Functioning/Environment Level of Independence: Needs assistance  Gait / Transfers Assistance Needed: sometimes needed A with sit - stand ADL's / Homemaking Assistance Needed: could feed himself; A with  bathing dressing Communication / Swallowing Assistance Needed: HOH, dysarthric  Comments: Difficulty with vision at baseline        OT Problem List: Decreased strength;Decreased activity tolerance;Decreased range of motion;Impaired balance (sitting and/or standing);Impaired vision/perception;Decreased coordination;Decreased cognition;Decreased safety awareness;Impaired tone;Impaired UE functional use      OT Treatment/Interventions: Self-care/ADL training;Therapeutic exercise;Neuromuscular education;DME and/or AE instruction;Therapeutic activities;Visual/perceptual remediation/compensation;Patient/family education;Balance training    OT Goals(Current goals can be found in the care plan section) Acute Rehab OT Goals Patient Stated Goal: to go home OT Goal Formulation: With patient Time For Goal Achievement: 11/17/18 Potential to Achieve Goals: Good  OT Frequency: Min 2X/week   Barriers to D/C:            Co-evaluation PT/OT/SLP Co-Evaluation/Treatment: Yes Reason for Co-Treatment: For patient/therapist safety;To address functional/ADL transfers PT goals addressed during session: Mobility/safety with mobility OT goals addressed during session: ADL's and self-care      AM-PAC OT "6 Clicks" Daily Activity     Outcome Measure Help from another person eating meals?: A Little Help from another person taking care of personal grooming?: A Little Help from another person toileting, which includes using toliet, bedpan, or urinal?: A Lot Help from another person bathing (including washing, rinsing, drying)?: A Lot Help from another person to put on and taking off regular upper body clothing?: A Lot Help from another person to put on and taking off regular lower body clothing?: A Lot 6 Click Score: 14   End of Session Equipment Utilized During Treatment: Gait belt;Rolling walker Nurse Communication: Mobility status  Activity Tolerance: Patient tolerated treatment well Patient left: in  chair;with call bell/phone within reach;with chair alarm set  OT Visit Diagnosis: Other abnormalities of gait and mobility (R26.89);Muscle weakness (generalized) (M62.81);Low vision, both eyes (H54.2);Cognitive communication deficit (R41.841);Other symptoms and signs involving cognitive function Symptoms and signs involving cognitive functions: Cerebral infarction(old/new?) Hemiplegia - Right/Left: Right Hemiplegia - dominant/non-dominant: Dominant Hemiplegia - caused by: Cerebral infarction                Time: 1147-1207 OT Time Calculation (min): 20 min Charges:  OT General Charges $OT Visit: 1 Visit OT Evaluation $OT Eval Moderate Complexity: 1 Mod  Delight Stare, OT Acute Rehabilitation Services Pager [phone] Office [phone]   Delight Stare 11/03/2018, 1:02 PM

## 2018-11-03 NOTE — Progress Notes (Signed)
STROKE TEAM PROGRESS NOTE   INTERVAL HISTORY No family is at the bedside.  Severe dysarthria but no obvious left sided weakness. Had recent admission for hematuria and anemia, but current admission hemoglobin is in good range. Discussed with primary team, pt seems not a good candidate for anticoagulation.   OBJECTIVE Vitals:   11/02/18 2330 11/03/18 0130 11/03/18 0429 11/03/18 0819  BP: 113/65 106/67 108/63 (!) 96/53  Pulse: 71 82 78 62  Resp: 18 18 17 17   Temp: 98.7 F (37.1 C) 98.9 F (37.2 C) 98.7 F (37.1 C) 99.1 F (37.3 C)  TempSrc: Oral Oral Oral Oral  SpO2: 97% 96% 95% 94%  Weight:      Height:        CBC:  Recent Labs  Lab 11/02/18 1327 11/03/18 0613  WBC 8.4 6.7  NEUTROABS 6.1  --   HGB 10.0* 9.1*  HCT 34.5* 31.4*  MCV 90.8 90.5  PLT 160 147*    Basic Metabolic Panel:  Recent Labs  Lab 11/02/18 1327 11/03/18 0613  NA 140 138  K 3.7 3.5  CL 100 99  CO2 31 32  GLUCOSE 98 104*  BUN 17 19  CREATININE 1.43* 1.56*  CALCIUM 9.4 9.2    Lipid Panel:     Component Value Date/Time   CHOL 109 11/03/2018 0613   TRIG 44 11/03/2018 0613   HDL 46 11/03/2018 0613   CHOLHDL 2.4 11/03/2018 0613   VLDL 9 11/03/2018 0613   LDLCALC 54 11/03/2018 0613   HgbA1c:  Lab Results  Component Value Date   HGBA1C 5.5 11/03/2018   Urine Drug Screen:     Component Value Date/Time   LABOPIA NONE DETECTED 12/18/2013 1904   COCAINSCRNUR NONE DETECTED 12/18/2013 1904   LABBENZ NONE DETECTED 12/18/2013 1904   AMPHETMU NONE DETECTED 12/18/2013 1904   THCU NONE DETECTED 12/18/2013 1904   LABBARB NONE DETECTED 12/18/2013 1904    Alcohol Level     Component Value Date/Time   ETH <11 12/18/2013 1732    IMAGING  Ct Head Wo Contrast 11/02/2018 IMPRESSION:  1. Stable exam without new or acute interval finding.  2. Stable 2.7 cm calcified meningioma of the left sphenoid wing.  3. Atrophy with advanced chronic small vessel white matter ischemic disease.  4. Prominent  encephalomalacia in the right cerebellum consistent with old infarct. Lacunar infarcts are seen in the cerebellar hemispheres bilaterally.   Mr Angio Head Wo Contrast Mr Angio Neck W Wo Contrast 11/02/2018 IMPRESSION:   Brain MRI:  1. Small acute or subacute infarcts along the right posterior frontal and right occipital cortex.  2. Severe chronic ischemic injury with multiple remote micro hemorrhages in a hypertensive pattern.  3. 2.7 cm meningioma in the left anterior cranial fossa with chronic vasogenic edema.  4. Remote bilateral cerebellar infarction, extensive on the right.   Intracranial MRA:   Negative Neck MRA: Generalized vessel tortuosity without stenosis or ulceration.    Mr Brain Wo Contrast 11/02/2018 IMPRESSION:  Brain MRI:  1. Small acute or subacute infarcts along the right posterior frontal and right occipital cortex.  2. Severe chronic ischemic injury with multiple remote micro hemorrhages in a hypertensive pattern.  3. 2.7 cm meningioma in the left anterior cranial fossa with chronic vasogenic edema.  4. Remote bilateral cerebellar infarction, extensive on the right.  Intracranial MRA:  Negative Neck MRA: Generalized vessel tortuosity without stenosis or ulceration.    Vas [place] Lower Extremity Venous (dvt) 11/03/2018 Summary:   Right:  Findings consistent with age indeterminate deep vein thrombosis involving the right popliteal vein. Findings consistent with chronic deep vein thrombosis involving the right common femoral vein, right femoral vein, right posterior tibial veins, and right peroneal veins. Findings appear improved from previous examination.   Left:  Findings appear improved from previous examination. There is no evidence of deep vein thrombosis in the lower extremity.    Transthoracic Echocardiogram  10/08/2018 IMPRESSIONS  1. The left ventricle has normal systolic function with an ejection fraction of 60-65%. The cavity size was normal. There is  mildly increased left ventricular wall thickness. Left ventricular diastolic Doppler parameters are consistent with impaired  relaxation. Indeterminate filling pressures The E/e' is 8-15. No evidence of left ventricular regional wall motion abnormalities.  2. The right ventricle has normal systolic function. The cavity was normal. There is no increase in right ventricular wall thickness.  3. Left atrial size was severely dilated.  4. The mitral valve is grossly normal.  5. The tricuspid valve is grossly normal.  6. The aortic valve is tricuspid. Mild calcification of the aortic valve. No stenosis of the aortic valve.  7. The aorta is abnormal in size and structure.  8. There is moderate dilatation of the ascending aorta measuring 45 mm.  9. The inferior vena cava was dilated in size with >50% respiratory variability. SUMMARY LVEF 60-65%, mild LVH, normal wall motion, grade 1 DD, indeterminate LV filling pressure, severe LAE, mildly calcified aortic valve without stenosis, dilated ascending aorta to 4.5 cm, dilated IVC that collapses   ECG - SR rate 77 BPM. (See cardiology reading for complete details)   PHYSICAL EXAM  Temp:  [97.7 F (36.5 C)-99.1 F (37.3 C)] 99.1 F (37.3 C) (08/23 0819) Pulse Rate:  [62-82] 62 (08/23 0819) Resp:  [17-18] 17 (08/23 0819) BP: (96-130)/(53-89) 96/53 (08/23 0819) SpO2:  [94 %-100 %] 94 % (08/23 0819)  General - Well nourished, well developed, in no apparent distress.  Ophthalmologic - fundi not visualized due to noncooperation.  Cardiovascular - Regular rate and rhythm.  Mental Status -  Level of arousal and orientation to month, year of birth and person were intact, however, not oriented to current year or place. Language exam showed paucity of speech but answering questions appropriately, following all simple commands, naming 1/4 and not able to repeat likely due to hard of hearing. Severe dysarthria.   Cranial Nerves II - XII - II - Visual  field intact OU. III, IV, VI - Extraocular movements intact. V - Facial sensation intact bilaterally. VII - Facial movement intact bilaterally. VIII - Hard of hearing & vestibular intact bilaterally. X - Palate elevates symmetrically. XI - Chin turning & shoulder shrug intact bilaterally. XII - Tongue protrusion intact.  Motor Strength - The patient's strength was symmetrical in all extremities and pronator drift was absent, no focal weakness seen on exam.  Bulk was normal except atrophy of left interosseous muscle between thumb and index finger and fasciculations were absent.   Motor Tone - Muscle tone was assessed at the neck and appendages and was normal.  Reflexes - The patient's reflexes were 1+ in all extremities and he had no pathological reflexes.  Sensory - Light touch, temperature/pinprick were assessed and were symmetrical.    Coordination - The patient had normal movements in the hands with no ataxia or dysmetria although slow motion.  Tremor was absent.  Gait and Station - deferred.   ASSESSMENT/PLAN [name] is a 85 y.o. 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  He did not receive IV t-PA due to late presentation (>4.5 hours from time of onset)  Stroke: Small acute or subacute infarcts along the right posterior frontal and right occipital cortex - embolic - concerning for occult afib vs. Hypercoagulable state given recurrent DVTs  CT head - Stable 2.7 cm calcified meningioma of the left sphenoid wing. Prominent encephalomalacia in the right cerebellum consistent with old infarct. Lacunar infarcts are seen in the cerebellar hemispheres bilaterally.   MRI head - Small acute or subacute infarcts along the right posterior frontal and right occipital cortex. Severe chronic ischemic injury with multiple remote micro hemorrhages in a  hypertensive pattern. Remote bilateral CVAs  MRA head and Neck - unremarkable.  LE Venous Dopplers - RLE chronic DVT  2D Echo - EF 60 - 65%. No cardiac source of emboli identified. However, severe LA enlargement  [employer] Virus 2 - negative  LDL - 54  HgbA1c - 5.5  VTE prophylaxis - Lovenox  No antithrombotic prior to admission, now on aspirin 81 mg daily and clopidogrel 75 mg daily. Continue DAPT for 3 weeks and then ASA 325mg  alone. Pt not AC candidate due to advanced age, frequent falls, recent hematuria with anemia.  Patient counseled to be compliant with his antithrombotic medications  Ongoing aggressive stroke risk factor management  Therapy recommendations:  HHOT  Disposition:  Pending  Hx of stroke  2013 right cerebellar infarct with residual gait disorder, frequent falls  12/2013 admitted for unsteadiness, slurred speech.  MRI showed left PCA small infarcts.  MRA and carotid Doppler negative.  LDL 54 and A1c 5.8.  Recommend Xarelto for stroke prevention.  Recurrent DVTs  DVT 20 and 15 seem to be provoked by prostate procedure  LE venous Doppler showed chronic right extensive DVT  However, patient not candidate for [facility] due to advanced age, frequent falls, anemia  Hematuria Anemia   Admitted on 10/17/18 with hematuria, anemia and syncope  Hb 7.2->7.5->7.9->10.0->9.1  Close CBC monitoring  Continue DAPT but not AC candidate  Hypertension  Home BP meds: Norvasc 5 mg daily ;  Lasix 80 mg daily  Current BP meds: none  Blood pressure somewhat low at times . Permissive hypertension (OK if < 180/105) but gradually normalize in 5-7 days . Long-term BP goal normotensive  Hyperlipidemia  Home Lipid lowering medication:  Lipitor 20 mg daily  LDL 54, goal < 70  Current lipid lowering medication: lipitor 20 mg daily  Continue statin at discharge  Other Stroke Risk Factors  Advanced ag  Obesity, Body mass index is 33.67 kg/m., recommend weight  loss, diet and exercise as appropriate   Hx stroke/TIA  Other Active Problems  CKD - creatinine - 1.43->1.56  Mild thrombocytopenia - 147  Hospital day # 1  Neurology will sign off. Please call with questions. Pt will follow up with stroke clinic NP at [facility] in about 4 weeks. Thanks for the consult.  Rosalin Hawking, MD PhD Stroke Neurology 11/03/2018 6:32 PM   To contact Stroke Continuity provider, please refer to [email or web address] After hours, contact General Neurology

## 2018-11-04 ENCOUNTER — Ambulatory Visit: Payer: Medicare HMO

## 2018-11-04 DIAGNOSIS — D500 Iron deficiency anemia secondary to blood loss (chronic): Secondary | ICD-10-CM

## 2018-11-04 DIAGNOSIS — D509 Iron deficiency anemia, unspecified: Secondary | ICD-10-CM

## 2018-11-04 DIAGNOSIS — Z7902 Long term (current) use of antithrombotics/antiplatelets: Secondary | ICD-10-CM

## 2018-11-04 DIAGNOSIS — Z7982 Long term (current) use of aspirin: Secondary | ICD-10-CM

## 2018-11-04 DIAGNOSIS — Z86718 Personal history of other venous thrombosis and embolism: Secondary | ICD-10-CM

## 2018-11-04 DIAGNOSIS — Z8719 Personal history of other diseases of the digestive system: Secondary | ICD-10-CM

## 2018-11-04 MED ORDER — SODIUM CHLORIDE 0.9 % IV SOLN
510.0000 mg | Freq: Once | INTRAVENOUS | Status: AC
  Administered 2018-11-04: 510 mg via INTRAVENOUS
  Filled 2018-11-04: qty 17

## 2018-11-04 MED ORDER — ASPIRIN 81 MG PO TBEC: 81.0000 mg | DELAYED_RELEASE_TABLET | Freq: Every day | ORAL | 1 refills | Status: DC

## 2018-11-04 MED ORDER — CLOPIDOGREL BISULFATE 75 MG PO TABS: 75.0000 mg | ORAL_TABLET | Freq: Every day | ORAL | 0 refills | Status: DC

## 2018-11-04 NOTE — Discharge Summary (Signed)
Name: [name] MRN: [id number] DOB: 07-Apr-1924 85 y.o. PCP: [name], DO  Date of Admission: 11/02/2018  1:09 PM Date of Discharge: 11/04/2018 Attending Physician: [name], DO  Discharge Diagnosis: 1. Acute CVA 2. Iron Deficiency Anemia  Discharge Medications: Allergies as of 11/04/2018   No Known Allergies     Medication List    TAKE these medications   amLODipine 5 MG tablet Commonly known as: NORVASC Take 1 tablet (5 mg total) by mouth daily.   aspirin 81 MG EC tablet Take 1 tablet (81 mg total) by mouth daily.   atorvastatin 20 MG tablet Commonly known as: LIPITOR Take 1 tablet (20 mg total) by mouth daily at 6 PM. What changed: when to take this   clopidogrel 75 MG tablet Commonly known as: PLAVIX Take 1 tablet (75 mg total) by mouth daily. Start taking on: November 05, 2018   furosemide 40 MG tablet Commonly known as: LASIX Take 2 tablets (80 mg total) by mouth daily.       Disposition and follow-up:   [name] was discharged from [facility] in Stable condition.  At the hospital follow up visit please address:  1.  Acute CVA. Neurology has recommended that he take aspirin and Plavix for three weeks followed by aspirin alone. They of also switched him from rosuvastatin to atorvastatin.  2.  Labs / imaging needed at time of follow-up: None  3.  Pending labs/ test needing follow-up: None  Follow-up Appointments: Follow-up Information    [name]. Schedule an appointment as soon as possible for a visit in 4 week(s).   Specialty: Neurology Contact information: [address] [place] [phone]       Joni Reining C, DO Follow up in 1 week(s).   Specialty: Internal Medicine Why: The clinic will call you to make the appointment. Contact information: [place] [address] [place] Hospital Course by problem  list:  1. Acute CVA. [name] is a 85 year old male with a history of hypertension, heart failure preserved ejection fraction, prior CVA with residual right sided deficits, DVT status post IVC filter, prior diverticular bleed, prior hematuria, and CKD stage III who presented with acute left arm weakness and left leg weakness. Code stroke was initiated in the ED. The patient was evaluated by neurology. Initial head CT was negative however follow-up MRI illustrated an acute right posterior frontal and right occipital cortex CVA. The patient was started on dual antiplatelet therapy and switch to atorvastatin. His CVA was felt to be of embolic source however given his prior hematuria and G.I. bleed on Xarelto he is not considered a candidate for anticoagulation. Physical therapy and occupational therapy evaluated the patient and recommended home health. Of note the patient does have a palliative care visit at his home tomorrow.  2. Iron Deficiency Anemia. Patient noted to have iron deficiency anemia. He was given a transfusion of IV iron prior to discharge.  Discharge Vitals:   BP 127/72 (BP Location: Left Arm)   Pulse 82   Temp 98.4 F (36.9 C) (Oral)   Resp 17   Ht 5\' 7"  (1.702 m)   Wt 97.5 kg   SpO2 93%   BMI 33.67 kg/m   Pertinent Labs, Studies, and Procedures:   CT Head w/out Contrast 11/02/2018 1. Stable exam without new or acute interval finding. 2. Stable 2.7  cm calcified meningioma of the left sphenoid wing. 3. Atrophy with advanced chronic small vessel white matter ischemic disease. 4. Prominent encephalomalacia in the right cerebellum consistent with old infarct. Lacunar infarcts are seen in the cerebellar hemispheres bilaterally.  MRI / MRA 11/02/2018 Brain MRI:  1. Small acute or subacute infarcts along the right posterior frontal and right occipital cortex. 2. Severe chronic ischemic injury with multiple remote micro hemorrhages in a hypertensive pattern. 3. 2.7 cm  meningioma in the left anterior cranial fossa with chronic vasogenic edema. 4. Remote bilateral cerebellar infarction, extensive on the right.  Intracranial MRA:  Negative  Neck MRA:  Generalized vessel tortuosity without stenosis or ulceration.  Discharge Instructions: Discharge Instructions    (HEART FAILURE PATIENTS) Call MD:  Anytime you have any of the following symptoms: 1) 3 pound weight gain in 24 hours or 5 pounds in 1 week 2) shortness of breath, with or without a dry hacking cough 3) swelling in the hands, feet or stomach 4) if you have to sleep on extra pillows at night in order to breathe.   Complete by: As directed    Ambulatory referral to Neurology   Complete by: As directed    Follow up with stroke clinic NP (Jessica Vanschaick or Cecille Rubin, if both not available, consider Zachery Dauer, or Ahern) at [facility] in about 4 weeks. Thanks.   Call MD for:  difficulty breathing, headache or visual disturbances   Complete by: As directed    Call MD for:  persistant dizziness or light-headedness   Complete by: As directed    Diet - low sodium heart healthy   Complete by: As directed    Discharge instructions   Complete by: As directed    Thank you for allowing [place] to care for you during her admission.  You were admitted for an acute stroke.  Neurology has evaluated and have made the following recommendations: Continue taking aspirin 81 mg every day.  Take Plavix 75 mg every day for 3 weeks, then stop.  Continue taking Lipitor 20 mg.  Follow up with your Neurologist in 4 weeks.   Increase activity slowly   Complete by: As directed     Signed: Ina Homes, MD 11/04/2018, 10:37 AM

## 2018-11-04 NOTE — Progress Notes (Signed)
   Subjective:   [name] continues to do well today.  He proudly demonstrated his left upper extremity strength with pulling himself up without assistance.  He denies any pain, including chest pain and headache.  Denies shortness of breath, dizziness.  He feels ready to go home.  Objective: Vital signs in last 24 hours: Vitals:   11/03/18 1725 11/03/18 2013 11/03/18 2316 11/04/18 0325  BP: (!) 137/95 125/77 117/75 120/60  Pulse: 65 78 79 78  Resp: 18 17 14 15   Temp: 99 F (37.2 C) 98.2 F (36.8 C) 98.9 F (37.2 C) 98.5 F (36.9 C)  TempSrc: Oral Oral Oral Oral  SpO2: 96% 90% 96% 97%  Weight:      Height:       Physical Exam Constitutional:      Appearance: He is obese.  Cardiovascular:     Rate and Rhythm: Normal rate and regular rhythm.     Heart sounds: No murmur.  Pulmonary:     Effort: Pulmonary effort is normal. No respiratory distress.  Abdominal:     Palpations: Abdomen is soft.     Tenderness: There is no abdominal tenderness.  Skin:    General: Skin is warm and dry.  Neurological:     General: No focal deficit present.     Mental Status: He is alert.     Motor: No weakness.     Comments: Upper extremities: 5/5 strength    Assessment/Plan:  Principal Problem:   Cerebral embolism with cerebral infarction Active Problems:   HTN (hypertension)   Chronic diastolic (congestive) heart failure (HCC)  #Small acute/subacute infarcts of right posterior frontal and right occipital cortex:  Patient is 86yo male with PMHx of HTN and CVA with right sided residual deficits presenting with left sided weakness that patient reported felt like prior stroke. MRI brain consistent with small acute/subacute infarcts along right posterior frontal and occipital cortex. Neurology is onboard and we appreciate their recommendations. No loop recorder or Echo, given patient's age and medical history. Daughter is in agreement to de-escalate. He has a palliative consult tomorrow at  home.   - ASA + Plavix for 3 weeks then ASA alone per neurology recs  # Hypertension/HFpEF:  Plan to resume home meds on discharge.  # Hyperlipidemia:  - Continue Rosuvastatin 20mg  qd   #Anemia:  Hgb stable compared to prior. Previous iron panel: Fe 17, TIBC 377, Ferritin 16 consistent with iron deficiency anemia.   - IV Ferraheme prior to discharge.   Dispo: Anticipated discharge today.   Dr. Jose Persia Internal Medicine PGY-1  Pager: [phone] 11/04/2018, 6:54 AM

## 2018-11-04 NOTE — TOC Transition Note (Addendum)
Transition of Care [facility]) - CM/SW Discharge Note   Patient Details  Name: [name] MRN: [id number] Date of Birth: [birth year]  [facility]) CM/SW Contact:  Pollie Friar, RN Phone Number: 11/04/2018, 10:55 AM   Clinical Narrative:    Pt discharging home with resumption of [place] services through [facility]. CM notified Melissa with [facility] of d/c and resumption orders.  Daughter: [name] aware of d/c and will provide transportation home. Will update bedside RN. Pt active with [facility] for home palliative care.    Final next level of care: Home w Home Health Services Barriers to Discharge: No Barriers Identified   Patient Goals and CMS Choice   CMS Medicare.gov Compare Post Acute Care list provided to:: Patient Represenative (must comment) Choice offered to / list presented to : Adult Children(daughter)  Discharge Placement                       Discharge Plan and Services                          HH Arranged: PT, OT [place] Agency: [facility] ([facility])(resumption of services) Date HH Agency Contacted: 11/04/18      Social Determinants of Health (SDOH) Interventions     Readmission Risk Interventions No flowsheet data found.

## 2018-11-04 NOTE — Consult Note (Signed)
   [facility] CM Inpatient Consult   11/04/2018  [name] 05/04/1924 [id number]     Patient screened for high risk score for unplanned readmissions [22%], with 30 day readmission and 4 hospitalizations in the past 6 months, and to check if potential [place] Management services are needed for patient with [facility].   Chart review reveals per MD history & physical on 11/02/2018 as follows: 85 y.o. yo male w/ PMH significant for HTN, HFpEF, CVA with residual right sided deficits, DVT and prior IVC filter and CKD 3 presenting with <12 hours of left arm and leg weakness that patient reports "feels like a stroke". History obtained by patient's daughter at bedside. He has had trouble walking with his walker due to the left sided weakness. No facial droop noted by daughter. No headache, dizziness, slurred speech, changes in vision, difficulty swallowing or urinary/fecal incontinence.  In the ED, patient noted to have left upper extremity ataxia with decreased grip strength on left compared to right. CT head w/o contrast without new or acute interval findings. MRI/MRA ordered and patient admitted for further stroke.  (Acute CVA, Iron Deficiency Anemia)   Primary Care provider is [name], MD with [facility].  Called and spoke with transition of care CM stating that patient is transitioning to home with resumption of [place] services through [facility] and has a referral to Palliative Care ([facility]) through PCP office. Confirmed referral with (Nadine/ Jerald Kief) [facility] Admitting and was informed that patient has a schedule to be seen today after discharge. No barriers or further needs identified per transition of care CM.  Patient will be followed by [facility], no other [facility] Management follow-up needs at this time.   For questions and referral, please call:  Edwena Felty A. Jamine Highfill, BSN, RN-BC [facility] Liaison  Cell: [phone]

## 2018-11-04 NOTE — Progress Notes (Addendum)
Pt. Discharged home. Alert & oriented x4. Discharge instructions done. Pt. Verbalizes understanding. Called [name] (daughter) and reviewed discharge information. Pt. Will be transported out of hospital by family.   [name] A Shelisa Fern

## 2018-11-05 DIAGNOSIS — I5032 Chronic diastolic (congestive) heart failure: Secondary | ICD-10-CM | POA: Diagnosis not present

## 2018-11-05 DIAGNOSIS — F0390 Unspecified dementia without behavioral disturbance: Secondary | ICD-10-CM | POA: Diagnosis not present

## 2018-11-05 DIAGNOSIS — R531 Weakness: Secondary | ICD-10-CM | POA: Diagnosis not present

## 2018-11-05 DIAGNOSIS — G3281 Cerebellar ataxia in diseases classified elsewhere: Secondary | ICD-10-CM | POA: Diagnosis not present

## 2018-11-05 DIAGNOSIS — I69993 Ataxia following unspecified cerebrovascular disease: Secondary | ICD-10-CM | POA: Diagnosis not present

## 2018-11-06 ENCOUNTER — Telehealth: Payer: Self-pay | Admitting: *Deleted

## 2018-11-06 DIAGNOSIS — I130 Hypertensive heart and chronic kidney disease with heart failure and stage 1 through stage 4 chronic kidney disease, or unspecified chronic kidney disease: Secondary | ICD-10-CM | POA: Diagnosis not present

## 2018-11-06 DIAGNOSIS — N183 Chronic kidney disease, stage 3 (moderate): Secondary | ICD-10-CM | POA: Diagnosis not present

## 2018-11-06 DIAGNOSIS — I69393 Ataxia following cerebral infarction: Secondary | ICD-10-CM | POA: Diagnosis not present

## 2018-11-06 DIAGNOSIS — D329 Benign neoplasm of meninges, unspecified: Secondary | ICD-10-CM | POA: Diagnosis not present

## 2018-11-06 DIAGNOSIS — D509 Iron deficiency anemia, unspecified: Secondary | ICD-10-CM | POA: Diagnosis not present

## 2018-11-06 DIAGNOSIS — I5032 Chronic diastolic (congestive) heart failure: Secondary | ICD-10-CM | POA: Diagnosis not present

## 2018-11-06 DIAGNOSIS — F0390 Unspecified dementia without behavioral disturbance: Secondary | ICD-10-CM | POA: Diagnosis not present

## 2018-11-06 DIAGNOSIS — D631 Anemia in chronic kidney disease: Secondary | ICD-10-CM | POA: Diagnosis not present

## 2018-11-06 DIAGNOSIS — I350 Nonrheumatic aortic (valve) stenosis: Secondary | ICD-10-CM | POA: Diagnosis not present

## 2018-11-06 NOTE — Telephone Encounter (Signed)
Received call from [place], [place] with [facility] requesting VO to resume care following hosp d/c. Verbal auth given. Will route to PCP for agreement/denial. Also, patient was d/c without oxygen. States SpO2 92% at rest and 95% with ambulation. [name], RN, BSN

## 2018-11-07 DIAGNOSIS — I350 Nonrheumatic aortic (valve) stenosis: Secondary | ICD-10-CM | POA: Diagnosis not present

## 2018-11-07 DIAGNOSIS — I130 Hypertensive heart and chronic kidney disease with heart failure and stage 1 through stage 4 chronic kidney disease, or unspecified chronic kidney disease: Secondary | ICD-10-CM | POA: Diagnosis not present

## 2018-11-07 DIAGNOSIS — I5032 Chronic diastolic (congestive) heart failure: Secondary | ICD-10-CM | POA: Diagnosis not present

## 2018-11-07 DIAGNOSIS — F0390 Unspecified dementia without behavioral disturbance: Secondary | ICD-10-CM | POA: Diagnosis not present

## 2018-11-07 DIAGNOSIS — N183 Chronic kidney disease, stage 3 (moderate): Secondary | ICD-10-CM | POA: Diagnosis not present

## 2018-11-07 DIAGNOSIS — D509 Iron deficiency anemia, unspecified: Secondary | ICD-10-CM | POA: Diagnosis not present

## 2018-11-07 DIAGNOSIS — D631 Anemia in chronic kidney disease: Secondary | ICD-10-CM | POA: Diagnosis not present

## 2018-11-07 DIAGNOSIS — I69393 Ataxia following cerebral infarction: Secondary | ICD-10-CM | POA: Diagnosis not present

## 2018-11-07 DIAGNOSIS — D329 Benign neoplasm of meninges, unspecified: Secondary | ICD-10-CM | POA: Diagnosis not present

## 2018-11-07 NOTE — Telephone Encounter (Signed)
Agree with VO, thanks

## 2018-11-08 DIAGNOSIS — D329 Benign neoplasm of meninges, unspecified: Secondary | ICD-10-CM | POA: Diagnosis not present

## 2018-11-08 DIAGNOSIS — F0390 Unspecified dementia without behavioral disturbance: Secondary | ICD-10-CM | POA: Diagnosis not present

## 2018-11-08 DIAGNOSIS — D631 Anemia in chronic kidney disease: Secondary | ICD-10-CM | POA: Diagnosis not present

## 2018-11-08 DIAGNOSIS — I350 Nonrheumatic aortic (valve) stenosis: Secondary | ICD-10-CM | POA: Diagnosis not present

## 2018-11-08 DIAGNOSIS — I130 Hypertensive heart and chronic kidney disease with heart failure and stage 1 through stage 4 chronic kidney disease, or unspecified chronic kidney disease: Secondary | ICD-10-CM | POA: Diagnosis not present

## 2018-11-08 DIAGNOSIS — I69393 Ataxia following cerebral infarction: Secondary | ICD-10-CM | POA: Diagnosis not present

## 2018-11-08 DIAGNOSIS — D509 Iron deficiency anemia, unspecified: Secondary | ICD-10-CM | POA: Diagnosis not present

## 2018-11-08 DIAGNOSIS — N183 Chronic kidney disease, stage 3 (moderate): Secondary | ICD-10-CM | POA: Diagnosis not present

## 2018-11-08 DIAGNOSIS — I5032 Chronic diastolic (congestive) heart failure: Secondary | ICD-10-CM | POA: Diagnosis not present

## 2018-11-11 ENCOUNTER — Other Ambulatory Visit: Payer: Self-pay | Admitting: *Deleted

## 2018-11-11 DIAGNOSIS — I69393 Ataxia following cerebral infarction: Secondary | ICD-10-CM | POA: Diagnosis not present

## 2018-11-11 DIAGNOSIS — D509 Iron deficiency anemia, unspecified: Secondary | ICD-10-CM | POA: Diagnosis not present

## 2018-11-11 DIAGNOSIS — I350 Nonrheumatic aortic (valve) stenosis: Secondary | ICD-10-CM | POA: Diagnosis not present

## 2018-11-11 DIAGNOSIS — I130 Hypertensive heart and chronic kidney disease with heart failure and stage 1 through stage 4 chronic kidney disease, or unspecified chronic kidney disease: Secondary | ICD-10-CM | POA: Diagnosis not present

## 2018-11-11 DIAGNOSIS — D329 Benign neoplasm of meninges, unspecified: Secondary | ICD-10-CM | POA: Diagnosis not present

## 2018-11-11 DIAGNOSIS — F0390 Unspecified dementia without behavioral disturbance: Secondary | ICD-10-CM | POA: Diagnosis not present

## 2018-11-11 DIAGNOSIS — N183 Chronic kidney disease, stage 3 (moderate): Secondary | ICD-10-CM | POA: Diagnosis not present

## 2018-11-11 DIAGNOSIS — D631 Anemia in chronic kidney disease: Secondary | ICD-10-CM | POA: Diagnosis not present

## 2018-11-11 DIAGNOSIS — I5032 Chronic diastolic (congestive) heart failure: Secondary | ICD-10-CM | POA: Diagnosis not present

## 2018-11-11 NOTE — Patient Outreach (Addendum)
[facility]) Care Management  11/11/2018  [name] 11/25/1924 [id number]   Subjective: Telephone call to patient's home / mobile number, spoke with patient's daughter/ power of attorney/ designated party release  [employer]), stated patient's name, date of birth, and address. Discussed [place] Management [employer] Medicare EMMI Red Flag Alert follow up, daughter voiced understanding, and is in agreement to follow up on patient's behalf.  Daughter states patient is partially deaf, she has been receiving EMMI automated calls, and system did not capture the correct answers.   States patient does not have any problems or questions with medications and is taking them as prescribed.   States patient is doing well, is receiving home health services via [facility] (RN, physical therapy, occupational therapy) and palliative care services via [facility].   Daughter states patient has the following durable medical equipment: manual wheelchair, scale, and oxygen.   Daughter is looking for a bigger scale since patient feet are larger than scale, not secure standing on the scale, has discussed this issue with home health nurse, and may use patient's over the counter benefit to obtain new scale in the near future.  States she received congestive heart failure disease management education in the past from various resources.   States patient has a follow up appointment with primary MD on 9/14/20020 (appointment was rescheduled from 11/13/2018 due to schedule conflict), waiting for call from neurologist office regarding 4 week follow up appointment, and daughter is planning to call neurologist office regarding follow up appointment if no call received by 11/13/2018.   States she is accessing patient's [employer] Medicare benefits on patient's behalf as needed via member services number on back of card.   Daughter states patient is able to manage some self care and has assistance as needed.     Daughter states patient has 24 hours supervision and assistance as needed.  Daughter voices understanding of patient's medical diagnosis and treatment plan.   States she is very appreciative of the follow up and is in agreement to receive [place] [facility] EMMI follow up calls as needed on patient's behalf.   Daughter states she is getting another call on the other line, unable to complete assessment at this time, and requested call back.   Telephone call to patient's home  / mobile number, no answer, left HIPAA compliant voicemail message for patient's daughter/ power of attorney [facility]), and requested call back.     Objective: Per KPN (Knowledge Performance Now, point of care tool) and chart review, patient hospitalized 11/02/2018 - 11/04/2018 for Acute CVA, 10/17/2018  - 10/19/2018 for syncope, 10/03/2018 - 10/11/2018 for Acute Hypoxic Respiratory Failure secondary to  Acute on Chronic Diastolic Heart Failure, 0000000 - 08/25/2018 for Gastrointestinal bleed likely secondary to diverticula while on rivaroxaban for recurrent DVT.  Patient also has a history of chronic kidney disease stage 3, hypertension, hyperlipidemia, and CVA with residual right weakness.       Assessment:  Received [employer] Medicare EMMI Stroke Red Flag Alert follow up referral on 11/11/2018.    Red Flag Alert Trigger, Day # 3, patient answered yes to the following question: Questions/problems with meds?   EMMI follow up completed and will follow to assess for further care management needs.      Plan: RNCM will call patient's daughter for 2nd telephone outreach attempt within 4 business days, to assess for further CM needs, and proceed with case closure, within 10 business days if no return call.  Meigan Pates H. Annia Friendly, BSN, [place] Management [facility] Telephonic CM Phone: [phone] Fax: [phone]

## 2018-11-12 ENCOUNTER — Telehealth: Payer: Self-pay | Admitting: Internal Medicine

## 2018-11-12 ENCOUNTER — Other Ambulatory Visit: Payer: Self-pay | Admitting: *Deleted

## 2018-11-12 NOTE — Patient Outreach (Signed)
[facility]) Care Management  11/12/2018  [name] [birth year] [id number]   Subjective: Telephone call to patient's home  / mobile number, no answer, left HIPAA compliant voicemail message for [name] and [name] (patient's daughter/ designated party release/ power of attorney), and requested call back.    Objective: Per KPN (Knowledge Performance Now, point of care tool) and chart review, patient hospitalized 11/02/2018 - 11/04/2018 for Acute CVA, 10/17/2018  - 10/19/2018 for syncope, 10/03/2018 - 10/11/2018 for Acute Hypoxic Respiratory Failure secondary to  Acute on Chronic Diastolic Heart Failure, 0000000 - 08/25/2018 for Gastrointestinalbleed likely secondary to diverticula while on rivaroxaban for recurrent DVT.  Patient also has a history of chronic kidney disease stage 3, hypertension, hyperlipidemia, and CVA with residual right weakness.       Assessment:  Received [employer] Medicare EMMI Stroke Red Flag Alert follow up referral on 11/11/2018. Red Flag Alert Trigger, Day # 3, patient answered yes to the following question: Questions/problems with meds?   EMMI follow up completed and will follow to assess for further care management needs.      Plan: RNCM will send unsuccessful outreach letter, [facility] pamphlet, handout: Know Before You Go, will call patient's daughter for 3rd telephone outreach attempt within 4 business days, to assess for further CM needs, and proceed with case closure, within 10 business days if no return call.      Andreka Stucki H. Annia Friendly, BSN, [place] Management [facility] Telephonic CM Phone: [phone] Fax: [phone]

## 2018-11-12 NOTE — Telephone Encounter (Signed)
agree

## 2018-11-12 NOTE — Telephone Encounter (Signed)
Please call [facility] Nurse [place] Newcomer back for VO request  @ [phone].

## 2018-11-12 NOTE — Telephone Encounter (Signed)
VO given for OT 1x week for 8 weeks for strength building. Do you agree?

## 2018-11-13 ENCOUNTER — Ambulatory Visit: Payer: Medicare HMO

## 2018-11-13 ENCOUNTER — Other Ambulatory Visit: Payer: Self-pay | Admitting: *Deleted

## 2018-11-13 DIAGNOSIS — D631 Anemia in chronic kidney disease: Secondary | ICD-10-CM | POA: Diagnosis not present

## 2018-11-13 DIAGNOSIS — N183 Chronic kidney disease, stage 3 (moderate): Secondary | ICD-10-CM | POA: Diagnosis not present

## 2018-11-13 DIAGNOSIS — I5032 Chronic diastolic (congestive) heart failure: Secondary | ICD-10-CM | POA: Diagnosis not present

## 2018-11-13 DIAGNOSIS — I350 Nonrheumatic aortic (valve) stenosis: Secondary | ICD-10-CM | POA: Diagnosis not present

## 2018-11-13 DIAGNOSIS — F0390 Unspecified dementia without behavioral disturbance: Secondary | ICD-10-CM | POA: Diagnosis not present

## 2018-11-13 DIAGNOSIS — D329 Benign neoplasm of meninges, unspecified: Secondary | ICD-10-CM | POA: Diagnosis not present

## 2018-11-13 DIAGNOSIS — D509 Iron deficiency anemia, unspecified: Secondary | ICD-10-CM | POA: Diagnosis not present

## 2018-11-13 DIAGNOSIS — I69393 Ataxia following cerebral infarction: Secondary | ICD-10-CM | POA: Diagnosis not present

## 2018-11-13 DIAGNOSIS — I130 Hypertensive heart and chronic kidney disease with heart failure and stage 1 through stage 4 chronic kidney disease, or unspecified chronic kidney disease: Secondary | ICD-10-CM | POA: Diagnosis not present

## 2018-11-13 DIAGNOSIS — G3281 Cerebellar ataxia in diseases classified elsewhere: Secondary | ICD-10-CM | POA: Diagnosis not present

## 2018-11-13 NOTE — Patient Outreach (Signed)
[facility]) Care Management  11/13/2018  [name] Oct 13, 1924 [id number]   Subjective: Telephone call to patient's home  / mobile number, no answer, left HIPAA compliant voicemail message for [name] and [name] (patient's daughter/ designated party release/ power of attorney), and requested call back.    Objective:Per KPN (Knowledge Performance Now, point of care tool) and chart review,patient hospitalized 11/02/2018 - 11/04/2018 for Acute CVA, 10/17/2018 - 10/19/2018 for syncope, 10/03/2018 - 10/11/2018 for Acute Hypoxic Respiratory Failuresecondary toAcute on Chronic Diastolic Heart Failure, 0000000 - 08/25/2018 forGastrointestinalbleed likelysecondaryto diverticula while on rivaroxaban for recurrent DVT.Patient also has a history of chronic kidney disease stage 3, hypertension, hyperlipidemia, and CVA with residual right weakness.      Assessment: Received [employer] Medicare EMMI Stroke Red Flag Alert follow up referral on 11/11/2018. Red Flag Alert Trigger, Day # 3, patient answered yes to the following question: Questions/problems with meds?EMMI follow up completed andwill follow to assess forfurther care management needs.     Plan:RNCM has sent unsuccessful outreach letter, [facility] pamphlet, handout: Know Before You Go, and will proceed with case closure, within 10 business days if no return call.      Amberlyn Martinezgarcia H. Annia Friendly, BSN, [place] Management [facility] Telephonic CM Phone: [phone] Fax: [phone]

## 2018-11-15 NOTE — Addendum Note (Signed)
Addended by: Orson Gear on: 11/15/2018 11:14 AM   Modules accepted: Orders

## 2018-11-19 ENCOUNTER — Other Ambulatory Visit: Payer: Self-pay | Admitting: *Deleted

## 2018-11-19 DIAGNOSIS — I130 Hypertensive heart and chronic kidney disease with heart failure and stage 1 through stage 4 chronic kidney disease, or unspecified chronic kidney disease: Secondary | ICD-10-CM | POA: Diagnosis not present

## 2018-11-19 DIAGNOSIS — N183 Chronic kidney disease, stage 3 (moderate): Secondary | ICD-10-CM | POA: Diagnosis not present

## 2018-11-19 DIAGNOSIS — I5032 Chronic diastolic (congestive) heart failure: Secondary | ICD-10-CM | POA: Diagnosis not present

## 2018-11-19 DIAGNOSIS — F0390 Unspecified dementia without behavioral disturbance: Secondary | ICD-10-CM | POA: Diagnosis not present

## 2018-11-19 DIAGNOSIS — D631 Anemia in chronic kidney disease: Secondary | ICD-10-CM | POA: Diagnosis not present

## 2018-11-19 DIAGNOSIS — I69393 Ataxia following cerebral infarction: Secondary | ICD-10-CM | POA: Diagnosis not present

## 2018-11-19 DIAGNOSIS — D509 Iron deficiency anemia, unspecified: Secondary | ICD-10-CM | POA: Diagnosis not present

## 2018-11-19 DIAGNOSIS — D329 Benign neoplasm of meninges, unspecified: Secondary | ICD-10-CM | POA: Diagnosis not present

## 2018-11-19 DIAGNOSIS — I350 Nonrheumatic aortic (valve) stenosis: Secondary | ICD-10-CM | POA: Diagnosis not present

## 2018-11-19 NOTE — Patient Outreach (Signed)
[facility]) Care Management  11/19/2018  [name] [birth year] [id number]   Subjective: Received voicemail from patient's daughter / designated party release/ power of attorney [facility]) , states she is returning call, and requested call back.  Telephone call to patient's home / mobile number, spoke with patient's daughter/ power of attorney/ designated party release  [employer]), stated patient's name, date of birth, and address.   Daughter states patient is doing well, getting ready to celebrate his 89nd birthday on 11/22/2016, remembers speaking with this RNCM in the past, and remembers receiving EMMI automated calls. Daughter states patient does not have any education material, EMMI follow up, care coordination, care management, disease monitoring, transportation, community resource, or pharmacy needs at this time.  States she is very appreciative of the follow up and is in agreement to receive [place] Management EMMI follow up calls if needed.  Patient will continue to receive palliative care services through  [facility] and home health services through [facility]     Objective:Per KPN (Knowledge Performance Now, point of care tool) and chart review,patient hospitalized 11/02/2018 - 11/04/2018 for Acute CVA, 10/17/2018 - 10/19/2018 for syncope, 10/03/2018 - 10/11/2018 for Acute Hypoxic Respiratory Failuresecondary toAcute on Chronic Diastolic Heart Failure, 0000000 - 08/25/2018 forGastrointestinalbleed likelysecondaryto diverticula while on rivaroxaban for recurrent DVT.Patient also has a history of chronic kidney disease stage 3, hypertension, hyperlipidemia, and CVA with residual right weakness.      Assessment: Received [employer] Medicare EMMI Stroke Red Flag Alert follow up referral on 11/11/2018. Red Flag Alert Trigger, Day # 3, patient answered yes to the following question: Questions/problems with  meds?EMMI follow up completed and no further care management needs.      Plan:RNCM will complete case closure due to follow up completed / no care management needs.       Dona Walby H. Annia Friendly, BSN, [place] Management [facility] Telephonic CM Phone: [phone] Fax: [phone]

## 2018-11-21 DIAGNOSIS — D631 Anemia in chronic kidney disease: Secondary | ICD-10-CM | POA: Diagnosis not present

## 2018-11-21 DIAGNOSIS — D509 Iron deficiency anemia, unspecified: Secondary | ICD-10-CM | POA: Diagnosis not present

## 2018-11-21 DIAGNOSIS — F0390 Unspecified dementia without behavioral disturbance: Secondary | ICD-10-CM | POA: Diagnosis not present

## 2018-11-21 DIAGNOSIS — N183 Chronic kidney disease, stage 3 (moderate): Secondary | ICD-10-CM | POA: Diagnosis not present

## 2018-11-21 DIAGNOSIS — I5032 Chronic diastolic (congestive) heart failure: Secondary | ICD-10-CM | POA: Diagnosis not present

## 2018-11-21 DIAGNOSIS — I130 Hypertensive heart and chronic kidney disease with heart failure and stage 1 through stage 4 chronic kidney disease, or unspecified chronic kidney disease: Secondary | ICD-10-CM | POA: Diagnosis not present

## 2018-11-21 DIAGNOSIS — I350 Nonrheumatic aortic (valve) stenosis: Secondary | ICD-10-CM | POA: Diagnosis not present

## 2018-11-21 DIAGNOSIS — I69393 Ataxia following cerebral infarction: Secondary | ICD-10-CM | POA: Diagnosis not present

## 2018-11-21 DIAGNOSIS — D329 Benign neoplasm of meninges, unspecified: Secondary | ICD-10-CM | POA: Diagnosis not present

## 2018-11-25 ENCOUNTER — Ambulatory Visit (INDEPENDENT_AMBULATORY_CARE_PROVIDER_SITE_OTHER): Payer: Medicare HMO | Admitting: Internal Medicine

## 2018-11-25 ENCOUNTER — Other Ambulatory Visit: Payer: Self-pay

## 2018-11-25 VITALS — BP 141/92 | HR 70 | Temp 98.2°F | Ht 69.0 in | Wt 209.0 lb

## 2018-11-25 DIAGNOSIS — Z8669 Personal history of other diseases of the nervous system and sense organs: Secondary | ICD-10-CM

## 2018-11-25 DIAGNOSIS — I6340 Cerebral infarction due to embolism of unspecified cerebral artery: Secondary | ICD-10-CM

## 2018-11-25 DIAGNOSIS — L80 Vitiligo: Secondary | ICD-10-CM | POA: Diagnosis not present

## 2018-11-25 DIAGNOSIS — Z95828 Presence of other vascular implants and grafts: Secondary | ICD-10-CM

## 2018-11-25 DIAGNOSIS — I5032 Chronic diastolic (congestive) heart failure: Secondary | ICD-10-CM | POA: Diagnosis not present

## 2018-11-25 DIAGNOSIS — D500 Iron deficiency anemia secondary to blood loss (chronic): Secondary | ICD-10-CM

## 2018-11-25 DIAGNOSIS — I130 Hypertensive heart and chronic kidney disease with heart failure and stage 1 through stage 4 chronic kidney disease, or unspecified chronic kidney disease: Secondary | ICD-10-CM | POA: Diagnosis not present

## 2018-11-25 DIAGNOSIS — Z23 Encounter for immunization: Secondary | ICD-10-CM

## 2018-11-25 DIAGNOSIS — N189 Chronic kidney disease, unspecified: Secondary | ICD-10-CM

## 2018-11-25 DIAGNOSIS — Z79899 Other long term (current) drug therapy: Secondary | ICD-10-CM

## 2018-11-25 DIAGNOSIS — Z7982 Long term (current) use of aspirin: Secondary | ICD-10-CM

## 2018-11-25 DIAGNOSIS — I69354 Hemiplegia and hemiparesis following cerebral infarction affecting left non-dominant side: Secondary | ICD-10-CM | POA: Diagnosis not present

## 2018-11-25 DIAGNOSIS — Z86718 Personal history of other venous thrombosis and embolism: Secondary | ICD-10-CM | POA: Diagnosis not present

## 2018-11-25 DIAGNOSIS — Z8673 Personal history of transient ischemic attack (TIA), and cerebral infarction without residual deficits: Secondary | ICD-10-CM

## 2018-11-25 MED ORDER — FUROSEMIDE 40 MG PO TABS: 80.0000 mg | ORAL_TABLET | Freq: Every day | ORAL | 2 refills | Status: DC

## 2018-11-25 NOTE — Progress Notes (Addendum)
CC: Acute CVA follow-up  HPI:  JimmyJimmy Arroyo is a 85 y.o. male with a past medical history of prior CVAs, meningioma, hypertension, DVT s/p IVC filter, CKD, who presents to the clinic for acute CVA follow-up. His daughter is at hospital follow up as she is his primary caretaker.   Since discharge, [name] states that he has been doing really well.  Patient's daughter concurs that he has been doing very well.  His left arm and leg weakness has significantly improved, and he has been up using his walker as he was prior to admission.  He has a follow-up visit with neurology in about 2 weeks.  He has finished his Plavix course and is continue to take aspirin every day. No evidence of bleeding since initiating DAPT.   In addition, he has been taking his Lasix as prescribed and not had any increased leg swelling or shortness of breath.   Past Medical History:  Diagnosis Date  . Anemia   . BPH (benign prostatic hypertrophy)    TURP 05/19/13  . Cerebral embolism with cerebral infarction ([place]) 12/19/2013  . Chronic kidney disease    CHRONIC KIDNEY DISEASE, 2  . Difficulty hearing, right    BILATERAL HEARING LOSS - BEST TO TRY TO SPEAK INTO LEFT EAR  . DVT (deep venous thrombosis) ([place])   . Frequent falls   . History of DVT (deep vein thrombosis) 06/10/2013   Provoked s/p TURP. Dx per doppler 06/10/13. Complicated by hematuria while on anticoagulation s/p TURP. Initially on lovenox and coumadin, then stopped, then IVC filter placed 06/16/13. Anticoagulation restarted after hematuria stopped. Lovenox was discontinued apparently on 07/29/13 with comment on high risk for falls.   Total ~1.5 months of anticoagulation but interrupted with bleeding complication.  . Hyperlipidemia   . Hypertension   . Incontinence of urine    SOME INCONTINENCE  . Left adrenal mass ([place]) 09/11/2013  . Meningioma ([place])   . Stroke ([place])    Cerebellar, 2013; WALKS WITH WALKER, ABLE TO DRESS AND BATHE HIMSELF BUT FAMILY  TRIES TO PROVIDE SUPERVISION BECAUSE OF HIS HX OF FALL AND WEAKNESS LEGS, ARMS   . Thrombocytopenia ([place])   . Vitreous hemorrhage ([place]) 12/21/2013   OS 12/18/13 CT /MRI brain done for ataxia    Review of Systems:   Review of Systems  Constitutional: Negative for chills and fever.  Respiratory: Negative for shortness of breath.   Cardiovascular: Negative for chest pain and leg swelling.  Gastrointestinal: Negative for abdominal pain, nausea and vomiting.  Genitourinary: Negative for dysuria and frequency.  Musculoskeletal: Negative for back pain and myalgias.  Neurological: Positive for focal weakness (improving).   Physical Exam:  Vitals:   11/25/18 1345  BP: (!) 141/92  Pulse: 70  Temp: 98.2 F (36.8 C)  TempSrc: Oral  SpO2: 97%  Weight: 209 lb (94.8 kg)  Height: 5\' 9"  (1.753 m)   Physical Exam Constitutional:      General: He is not in acute distress.    Appearance: He is obese. He is not toxic-appearing.  Cardiovascular:     Rate and Rhythm: Regular rhythm. Bradycardia present.     Heart sounds: No murmur. No gallop.   Pulmonary:     Effort: Pulmonary effort is normal. No respiratory distress.     Breath sounds: No wheezing, rhonchi or rales.  Musculoskeletal:     Right lower leg: Edema (trace) present.     Left lower leg: Edema (trace) present.  Skin:  General: Skin is warm and dry.     Comments: Chronic vitiligo  Neurological:     Mental Status: He is alert. Mental status is at baseline.  Psychiatric:        Mood and Affect: Mood normal.        Behavior: Behavior normal.    Assessment & Plan:   See Encounters Tab for problem based charting.  Patient seen with Dr. Angelia Mould

## 2018-11-25 NOTE — Patient Instructions (Signed)
It was nice seeing you today! Thank you for choosing [facility] for your Primary Care.    Today we talked about:   1. Follow up with Neurology on 9/28. Otherwise, no other changes at this time.    Have a good day!

## 2018-11-25 NOTE — Assessment & Plan Note (Signed)
[name] was admitted to the hospital with left-sided arm and leg weakness.  MRI found evidence of acute right posterior frontal and right occipital cortex CVA.  Neurology was on board at this time and started patient on DAPT with aspirin and Plavix.  They recommended outpatient follow-up in 4 weeks and continuing Plavix for 3 weeks.  Aspirin to be continued indefinitely.    Patient has finished his course of Plavix and continues aspirin daily.  He has his neurology follow-up in 2 weeks.  Plan: - Continue aspirin daily

## 2018-11-25 NOTE — Assessment & Plan Note (Signed)
Since hospital admission, patient is down 6 pounds.  No evidence of volume overload on examination.  He has been doing well on his current regimen of Lasix 80 mg daily.  His daughter is watching his lower extremity swelling closely to ensure he does not become volume overloaded.  Plan: -Continue with current regimen

## 2018-11-25 NOTE — Assessment & Plan Note (Signed)
Chronic iron deficiency anemia.  During hospitalization, patient received 1 dose of IV Feraheme.  Plan: -Outpatient follow-up of CBC as needed -Monitor for signs of bleeding

## 2018-11-27 NOTE — Progress Notes (Signed)
Internal Medicine Clinic Attending  I saw and evaluated the patient.  I personally confirmed the key portions of the history and exam documented by Dr. Basaraba and I reviewed pertinent patient test results.  The assessment, diagnosis, and plan were formulated together and I agree with the documentation in the resident's note.    

## 2018-12-04 DIAGNOSIS — N183 Chronic kidney disease, stage 3 (moderate): Secondary | ICD-10-CM | POA: Diagnosis not present

## 2018-12-04 DIAGNOSIS — D631 Anemia in chronic kidney disease: Secondary | ICD-10-CM | POA: Diagnosis not present

## 2018-12-04 DIAGNOSIS — I350 Nonrheumatic aortic (valve) stenosis: Secondary | ICD-10-CM | POA: Diagnosis not present

## 2018-12-04 DIAGNOSIS — I5032 Chronic diastolic (congestive) heart failure: Secondary | ICD-10-CM | POA: Diagnosis not present

## 2018-12-04 DIAGNOSIS — I69393 Ataxia following cerebral infarction: Secondary | ICD-10-CM | POA: Diagnosis not present

## 2018-12-04 DIAGNOSIS — D329 Benign neoplasm of meninges, unspecified: Secondary | ICD-10-CM | POA: Diagnosis not present

## 2018-12-04 DIAGNOSIS — I130 Hypertensive heart and chronic kidney disease with heart failure and stage 1 through stage 4 chronic kidney disease, or unspecified chronic kidney disease: Secondary | ICD-10-CM | POA: Diagnosis not present

## 2018-12-04 DIAGNOSIS — F0390 Unspecified dementia without behavioral disturbance: Secondary | ICD-10-CM | POA: Diagnosis not present

## 2018-12-04 DIAGNOSIS — D509 Iron deficiency anemia, unspecified: Secondary | ICD-10-CM | POA: Diagnosis not present

## 2018-12-05 DIAGNOSIS — D329 Benign neoplasm of meninges, unspecified: Secondary | ICD-10-CM | POA: Diagnosis not present

## 2018-12-05 DIAGNOSIS — I130 Hypertensive heart and chronic kidney disease with heart failure and stage 1 through stage 4 chronic kidney disease, or unspecified chronic kidney disease: Secondary | ICD-10-CM | POA: Diagnosis not present

## 2018-12-05 DIAGNOSIS — D631 Anemia in chronic kidney disease: Secondary | ICD-10-CM | POA: Diagnosis not present

## 2018-12-05 DIAGNOSIS — F0390 Unspecified dementia without behavioral disturbance: Secondary | ICD-10-CM | POA: Diagnosis not present

## 2018-12-05 DIAGNOSIS — I69393 Ataxia following cerebral infarction: Secondary | ICD-10-CM | POA: Diagnosis not present

## 2018-12-05 DIAGNOSIS — I350 Nonrheumatic aortic (valve) stenosis: Secondary | ICD-10-CM | POA: Diagnosis not present

## 2018-12-05 DIAGNOSIS — D509 Iron deficiency anemia, unspecified: Secondary | ICD-10-CM | POA: Diagnosis not present

## 2018-12-05 DIAGNOSIS — N183 Chronic kidney disease, stage 3 (moderate): Secondary | ICD-10-CM | POA: Diagnosis not present

## 2018-12-05 DIAGNOSIS — I5032 Chronic diastolic (congestive) heart failure: Secondary | ICD-10-CM | POA: Diagnosis not present

## 2018-12-06 DIAGNOSIS — G3281 Cerebellar ataxia in diseases classified elsewhere: Secondary | ICD-10-CM | POA: Diagnosis not present

## 2018-12-06 DIAGNOSIS — R531 Weakness: Secondary | ICD-10-CM | POA: Diagnosis not present

## 2018-12-06 DIAGNOSIS — F0390 Unspecified dementia without behavioral disturbance: Secondary | ICD-10-CM | POA: Diagnosis not present

## 2018-12-06 DIAGNOSIS — I69993 Ataxia following unspecified cerebrovascular disease: Secondary | ICD-10-CM | POA: Diagnosis not present

## 2018-12-06 DIAGNOSIS — I5032 Chronic diastolic (congestive) heart failure: Secondary | ICD-10-CM | POA: Diagnosis not present

## 2018-12-09 ENCOUNTER — Telehealth: Payer: Self-pay

## 2018-12-09 ENCOUNTER — Encounter: Payer: Self-pay | Admitting: Adult Health

## 2018-12-09 ENCOUNTER — Other Ambulatory Visit: Payer: Self-pay

## 2018-12-09 ENCOUNTER — Ambulatory Visit (INDEPENDENT_AMBULATORY_CARE_PROVIDER_SITE_OTHER): Payer: Medicare HMO | Admitting: Adult Health

## 2018-12-09 VITALS — BP 118/80 | HR 76 | Temp 98.0°F | Ht 69.0 in

## 2018-12-09 DIAGNOSIS — I63411 Cerebral infarction due to embolism of right middle cerebral artery: Secondary | ICD-10-CM | POA: Diagnosis not present

## 2018-12-09 DIAGNOSIS — I10 Essential (primary) hypertension: Secondary | ICD-10-CM

## 2018-12-09 DIAGNOSIS — R4189 Other symptoms and signs involving cognitive functions and awareness: Secondary | ICD-10-CM | POA: Diagnosis not present

## 2018-12-09 DIAGNOSIS — E785 Hyperlipidemia, unspecified: Secondary | ICD-10-CM

## 2018-12-09 NOTE — Progress Notes (Signed)
I agree with the above plan 

## 2018-12-09 NOTE — Progress Notes (Signed)
[name] [address] [place]. [name] [address] [phone]       HOSPITAL FOLLOW UP NOTE  [name] Date of Birth:  [birth year] Medical Record Number:  [id number]   Reason for Referral:  hospital stroke follow up    CHIEF COMPLAINT:  Chief Complaint  Patient presents with   Hospitalization Follow-up    Daughter present. Rm 9. No new concerns at this time.     HPI: [name] being seen today for in office hospital follow-up regarding small acute/subacute infarcts along the right posterior frontal and right occipital cortex on 11/02/2018.  History obtained from patient, daughter and chart review. Reviewed all radiology images and labs personally.  Mr. [name] is a 85 y.o. 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   Stroke work-up revealed small acute or subacute infarcts in the right posterior frontal and right occipital cortex embolic as evidenced on MRI concerning for occult A. fib versus hypercoagulable state given recurrent DVTs.  CT head showed stable 2.7 cm calcified meningioma.  MRI also showed severe chronic ischemic injury with multiple remote microhemorrhages in hypertensive pattern and remote bilateral CVAs.  Lower extremity venous Doppler RLE chronic DVT.  2D echo normal EF without cardiac source of embolus identified however severe LA enlargement.  LDL 54.  A1c 5.5.  No antithrombotic prior to admission and recommended DAPT for 3 weeks and aspirin alone.  No further cardioembolic work-up indicated as patient not a candidate for [facility] due to advanced age, frequent falls and recent hematuria with anemia.  Recent hospital admission on 10/17/2018 with hematuria, anemia and syncope with close CBC monitoring and continuation of DAPT but not AC candidate.  HTN stable.  Continuation of  atorvastatin 20 mg daily for HLD management.  Other stroke risk factors include prior stroke, chronic extensive DVT, advanced age, and obesity.  He was discharged home in stable condition with recommendation of home health therapy.  Mr. Standard is being seen today for hospital follow-up accompanied by his daughter.  Daughter provides majority of history.  Residual deficits of minimal left-sided weakness but does endorse great improvement.  Currently ambulating with rolling walker short distance and will use wheelchair for long distance.  Currently in the process of restarting home health therapy.  He continues to reside with his daughter which for the living arrangements prior to recent stroke.  Completed 3 weeks DAPT and continues on aspirin alone without bleeding or bruising.  Continues on atorvastatin 20 mg daily without myalgias.  Blood pressure today satisfactory at 118/80.  Denies new or worsening stroke/TIA symptoms.    ROS:   14 system review of systems performed and negative with exception of weakness  PMH:  Past Medical History:  Diagnosis Date   Anemia    BPH (benign prostatic hypertrophy)    TURP 05/19/13   Cerebral embolism with cerebral infarction ([facility] [place]) 12/19/2013   Chronic kidney disease    CHRONIC KIDNEY DISEASE, 2   Difficulty hearing, right    BILATERAL HEARING LOSS - BEST TO TRY TO SPEAK INTO LEFT EAR   DVT (deep venous thrombosis) ([facility])    Frequent falls    History of DVT (deep vein thrombosis) 06/10/2013   Provoked s/p TURP. Dx per doppler 06/10/13. Complicated by hematuria while on anticoagulation s/p TURP. Initially on lovenox and coumadin, then stopped, then  IVC filter placed 06/16/13. Anticoagulation restarted after hematuria stopped. Lovenox was discontinued apparently on 07/29/13 with comment on high risk for falls.   Total ~1.5 months of anticoagulation but interrupted with bleeding complication.   Hyperlipidemia    Hypertension    Incontinence of urine     SOME INCONTINENCE   Left adrenal mass ([place]) 09/11/2013   Meningioma ([place])    Stroke ([facility])    Cerebellar, 2013; WALKS WITH WALKER, ABLE TO DRESS AND BATHE HIMSELF BUT FAMILY TRIES TO PROVIDE SUPERVISION BECAUSE OF HIS HX OF FALL AND WEAKNESS LEGS, ARMS    Thrombocytopenia ([facility])    Vitreous hemorrhage ([facility]) 12/21/2013   OS 12/18/13 CT /MRI brain done for ataxia     PSH:  Past Surgical History:  Procedure Laterality Date   CYSTOSCOPY N/A 06/13/2013   Procedure: CYSTOSCOPY FLEXIBLE BEDSIDE;  Surgeon: Ardis Hughs, MD;  Location: [facility];  Service: Urology;  Laterality: N/A;   MYRINGOTOMY WITH TUBE PLACEMENT Bilateral    TRANSURETHRAL RESECTION OF PROSTATE N/A 05/19/2013   Procedure: TRANSURETHRAL RESECTION OF THE PROSTATE WITH GYRUS INSTRUMENTS;  Surgeon: Ailene Rud, MD;  Location: [facility];  Service: Urology;  Laterality: N/A;    Social History:  Social History   Socioeconomic History   Marital status: Widowed    Spouse name: Not on file   Number of children: Not on file   Years of education: Not on file   Highest education level: Not on file  Occupational History   Not on file  Social Needs   Financial resource strain: Not on file   Food insecurity    Worry: Not on file    Inability: Not on file   Transportation needs    Medical: Not on file    Non-medical: Not on file  Tobacco Use   Smoking status: Never Smoker   Smokeless tobacco: Never Used  Substance and Sexual Activity   Alcohol use: No    Alcohol/week: 0.0 standard drinks   Drug use: No   Sexual activity: Never  Lifestyle   Physical activity    Days per week: Not on file    Minutes per session: Not on file   Stress: Not on file  Relationships   Social connections    Talks on phone: Not on file    Gets together: Not on file    Attends religious service: Not on file    Active member of club or organization: Not on file    Attends meetings of clubs or organizations: Not on file      Relationship status: Not on file   Intimate partner violence    Fear of current or ex partner: Not on file    Emotionally abused: Not on file    Physically abused: Not on file    Forced sexual activity: Not on file  Other Topics Concern   Not on file  Social History Narrative   Lives with daughter. Ambulates w walker. Can make basic meals (cereal). Very hard of hearing.     Family History:  Family History  Problem Relation Age of Onset   Diabetes Mother    Heart disease Mother    Hypertension Mother    Heart disease Father    Hypertension Father    Hypertension Daughter     Medications:   Current Outpatient Medications on File Prior to Visit  Medication Sig Dispense Refill   amLODipine (NORVASC) 5 MG tablet Take 1 tablet (5 mg total) by mouth daily.  90 tablet 3   aspirin EC 81 MG EC tablet Take 1 tablet (81 mg total) by mouth daily. 30 tablet 1   atorvastatin (LIPITOR) 20 MG tablet Take 1 tablet (20 mg total) by mouth daily at 6 PM. (Patient taking differently: Take 20 mg by mouth daily. ) 90 tablet 3   furosemide (LASIX) 40 MG tablet Take 2 tablets (80 mg total) by mouth daily. 30 tablet 2   No current facility-administered medications on file prior to visit.     Allergies:  No Known Allergies   Physical Exam  Vitals:   12/09/18 1411  BP: 118/80  Pulse: 76  Temp: 98 F (36.7 C)  TempSrc: Oral  Height: 5\' 9"  (1.753 m)   Body mass index is 30.86 kg/m. No exam data present  Depression screen [facility] 2/9 12/10/2018  Decreased Interest 0  Down, Depressed, Hopeless 0  PHQ - 2 Score 0  Altered sleeping -  Tired, decreased energy -  Change in appetite -  Feeling bad or failure about yourself  -  Trouble concentrating -  Moving slowly or fidgety/restless -  Suicidal thoughts -  PHQ-9 Score -  Difficult doing work/chores -  Some recent data might be hidden     General: well developed, well nourished,  very pleasant elderly African-American male,  seated, in no evident distress Head: head normocephalic and atraumatic.   Neck: supple with no carotid or supraclavicular bruits Cardiovascular: regular rate and rhythm, no murmurs Musculoskeletal: no deformity Skin:  no rash/petichiae Vascular:  Normal pulses all extremities   Neurologic Exam Mental Status: Awake and fully alert. Oriented to place and time. Remote memory intact. Attention span, concentration and fund of knowledge diminished. Mood and affect appropriate.  Cranial Nerves: Fundoscopic exam reveals sharp disc margins. Pupils equal, briskly reactive to light. Extraocular movements full without nystagmus. Visual fields full to confrontation.  HOH bilaterally. Facial sensation intact. Face, tongue, palate moves normally and symmetrically.  Motor: Normal bulk and tone.  Negative pronator drift.  Minimal decreased left hand dexterity Sensory.: intact to touch , pinprick , position and vibratory sensation.  Coordination: Rapid alternating movements normal in all extremities except slightly decreased in left hand. Finger-to-nose and heel-to-shin performed accurately bilaterally. Gait and Station: Gait assessment deferred as RW not present at visit Reflexes: 1+ and symmetric. Toes downgoing.     NIHSS  0 Modified Rankin  2    Diagnostic Data (Labs, Imaging, Testing)  Ct Head Wo Contrast 11/02/2018 IMPRESSION:  1. Stable exam without new or acute interval finding.  2. Stable 2.7 cm calcified meningioma of the left sphenoid wing.  3. Atrophy with advanced chronic small vessel white matter ischemic disease.  4. Prominent encephalomalacia in the right cerebellum consistent with old infarct. Lacunar infarcts are seen in the cerebellar hemispheres bilaterally.   Mr Angio Head Wo Contrast Mr Angio Neck W Wo Contrast 11/02/2018 IMPRESSION:   Brain MRI:  1. Small acute or subacute infarcts along the right posterior frontal and right occipital cortex.  2. Severe chronic ischemic  injury with multiple remote micro hemorrhages in a hypertensive pattern.  3. 2.7 cm meningioma in the left anterior cranial fossa with chronic vasogenic edema.  4. Remote bilateral cerebellar infarction, extensive on the right.   Intracranial MRA:   Negative Neck MRA: Generalized vessel tortuosity without stenosis or ulceration.    Mr Brain Wo Contrast 11/02/2018 IMPRESSION:  Brain MRI:  1. Small acute or subacute infarcts along the right posterior frontal and right  occipital cortex.  2. Severe chronic ischemic injury with multiple remote micro hemorrhages in a hypertensive pattern.  3. 2.7 cm meningioma in the left anterior cranial fossa with chronic vasogenic edema.  4. Remote bilateral cerebellar infarction, extensive on the right.  Intracranial MRA:  Negative Neck MRA: Generalized vessel tortuosity without stenosis or ulceration.    Vas [place] Lower Extremity Venous (dvt) 11/03/2018 Summary:   Right:  Findings consistent with age indeterminate deep vein thrombosis involving the right popliteal vein. Findings consistent with chronic deep vein thrombosis involving the right common femoral vein, right femoral vein, right posterior tibial veins, and right peroneal veins. Findings appear improved from previous examination.   Left:  Findings appear improved from previous examination. There is no evidence of deep vein thrombosis in the lower extremity.    Transthoracic Echocardiogram  10/08/2018 IMPRESSIONS  1. The left ventricle has normal systolic function with an ejection fraction of 60-65%. The cavity size was normal. There is mildly increased left ventricular wall thickness. Left ventricular diastolic Doppler parameters are consistent with impaired  relaxation. Indeterminate filling pressures The E/e' is 8-15. No evidence of left ventricular regional wall motion abnormalities.  2. The right ventricle has normal systolic function. The cavity was normal. There is no increase in  right ventricular wall thickness.  3. Left atrial size was severely dilated.  4. The mitral valve is grossly normal.  5. The tricuspid valve is grossly normal.  6. The aortic valve is tricuspid. Mild calcification of the aortic valve. No stenosis of the aortic valve.  7. The aorta is abnormal in size and structure.  8. There is moderate dilatation of the ascending aorta measuring 45 mm.  9. The inferior vena cava was dilated in size with >50% respiratory variability. SUMMARY LVEF 60-65%, mild LVH, normal wall motion, grade 1 DD, indeterminate LV filling pressure, severe LAE, mildly calcified aortic valve without stenosis, dilated ascending aorta to 4.5 cm, dilated IVC that collapses   ECG - SR rate 77 BPM. (See cardiology reading for complete details)    ASSESSMENT: [name] is a 85 y.o. year old male presented with sudden left lower leg weakness on 11/02/2018 with stroke work-up revealing small acute or subacute infarcts along the right posterior frontal and right occipital cortex embolic concerning for occult A. fib versus hypercoagulable state given recurrent DVTs. Vascular risk factors include prior stroke 2013, known meningioma, HTN, HLD, recurrent DVTs, hematuria, and anemia.  Residual deficits of mild decreased LUE dexterity but overall recovering well    PLAN:  1. Recent stroke: Continue aspirin 81 mg daily  and atorvastatin for secondary stroke prevention. Maintain strict control of hypertension with blood pressure goal below 130/90, diabetes with hemoglobin A1c goal below 6.5% and cholesterol with LDL cholesterol (bad cholesterol) goal below 70 mg/dL.  I also advised the patient to eat a healthy diet with plenty of whole grains, cereals, fruits and vegetables, exercise regularly with at least 30 minutes of continuous activity daily and maintain ideal body weight.  Advised to follow-up with PCP who is in the process of placing orders for [facility] therapy 2. HTN: Advised to  continue current treatment regimen.  Today's BP stable.  Advised to continue to monitor at home along with continued follow-up with PCP for management 3. HLD: Advised to continue current treatment regimen along with continued follow-up with PCP for future prescribing and monitoring of lipid panel 4. Chronic RLE DVT: Patient not candidate for [facility] due to advanced age, frequent falls and anemia  Follow up in 3 months or call earlier if needed   Greater than 50% of time during this 45 minute visit was spent on counseling, explanation of diagnosis of recent stroke, reviewing risk factor management of chronic DVT, HTN and HLD, discussion regarding increased risk of anticoagulation use and not recommended, planning of further management along with potential future management, and discussion with patient and family answering all questions.    Frann Rider, AGNP-BC  [facility] [address] [place], [place] [address]  Phone [phone] Fax [phone] Note: This document was prepared with digital dictation and possible smart phrase technology. Any transcriptional errors that result from this process are unintentional.

## 2018-12-09 NOTE — Patient Instructions (Signed)
Continue aspirin 81 mg daily  and Lipitor for secondary stroke prevention  Continue to follow up with PCP regarding cholesterol and blood pressure management   Contact PCP regarding referral to home health therapies as this will greatly benefit you for continued symptoms  Continue to monitor blood pressure at home  Maintain strict control of hypertension with blood pressure goal below 130/90, diabetes with hemoglobin A1c goal below 6.5% and cholesterol with LDL cholesterol (bad cholesterol) goal below 70 mg/dL. I also advised the patient to eat a healthy diet with plenty of whole grains, cereals, fruits and vegetables, exercise regularly and maintain ideal body weight.  Followup in the future with me in 3 months or call earlier if needed       Thank you for coming to see [place] at [facility]. I hope we have been able to provide you high quality care today.  You may receive a patient satisfaction survey over the next few weeks. We would appreciate your feedback and comments so that we may continue to improve ourselves and the health of our patients.

## 2018-12-09 NOTE — Telephone Encounter (Signed)
Requesting the doctor to write a discontinued order for the oxygen, please call pt's daughter back.

## 2018-12-10 ENCOUNTER — Other Ambulatory Visit: Payer: Self-pay | Admitting: Internal Medicine

## 2018-12-10 ENCOUNTER — Encounter: Payer: Self-pay | Admitting: Adult Health

## 2018-12-10 DIAGNOSIS — G301 Alzheimer's disease with late onset: Secondary | ICD-10-CM

## 2018-12-10 DIAGNOSIS — F0280 Dementia in other diseases classified elsewhere without behavioral disturbance: Secondary | ICD-10-CM

## 2018-12-10 NOTE — Telephone Encounter (Signed)
You documented normal O2 sats on 826 in a telephone encounter both at rest and with ambulation.  Do we need more recent documentation before DC the oxygen?

## 2018-12-10 NOTE — Telephone Encounter (Signed)
Left VM for [name] with [employer] that order has been placed to d/c home oxygen. [name], BSN, RN-BC

## 2018-12-10 NOTE — Progress Notes (Signed)
Daughter states patient refuses oxygen and there is no reason to have it in the home and pay for it.

## 2018-12-11 ENCOUNTER — Telehealth: Payer: Self-pay | Admitting: Internal Medicine

## 2018-12-11 DIAGNOSIS — I5032 Chronic diastolic (congestive) heart failure: Secondary | ICD-10-CM | POA: Diagnosis not present

## 2018-12-11 NOTE — Telephone Encounter (Signed)
Return call made to patient's dtr-made her aware that request to d/c O2 was sent to [facility] in error.  CMA apologized for the delay and informed dtr that order will be received by [facility] today.     Call made to [facility] to D/C home oxygen due to pt refusal to wear due to late onset Alzheimer's disease without behavioral disturbance order was faxed to [phone].  Regenia Skeeter, Hosie Sharman Cassady9/30/202012:20 PM

## 2018-12-11 NOTE — Progress Notes (Signed)
I agree with the above plan 

## 2018-12-11 NOTE — Telephone Encounter (Signed)
Pt's daughter calling to state no one has picked up the Pt's Oxygen.  Pt's daughter states [employer] was not her father's  [place]. The pt uses [facility]/ADAP[facility].  Pt's daughter would like a call back as she has not heard back from anyone after leaving a message on 12/09/2018.

## 2018-12-12 ENCOUNTER — Other Ambulatory Visit: Payer: Self-pay | Admitting: *Deleted

## 2018-12-12 MED ORDER — TRIAMCINOLONE ACETONIDE 0.1 % EX OINT: 1.0000 "application " | TOPICAL_OINTMENT | Freq: Two times a day (BID) | CUTANEOUS | 0 refills | Status: DC

## 2018-12-12 NOTE — Telephone Encounter (Signed)
Fax from [employer] [facility] - pt is requesting a refill on Triamcinolone 0.5% ointment. Not on current med list. Thanks

## 2018-12-12 NOTE — Telephone Encounter (Signed)
Looks like he has intermittent rash on his legs that has been previously well treated with triamcinolone. I am going to reduce potency to 0.1% triamcinolone, can follow up in [facility] if rash gets worse.

## 2018-12-13 ENCOUNTER — Telehealth: Payer: Self-pay | Admitting: Internal Medicine

## 2018-12-13 DIAGNOSIS — I5032 Chronic diastolic (congestive) heart failure: Secondary | ICD-10-CM | POA: Diagnosis not present

## 2018-12-13 DIAGNOSIS — G3281 Cerebellar ataxia in diseases classified elsewhere: Secondary | ICD-10-CM | POA: Diagnosis not present

## 2018-12-13 NOTE — Telephone Encounter (Signed)
Pt daughter is checking on oxygen request, pls contact [phone]

## 2018-12-16 ENCOUNTER — Telehealth: Payer: Self-pay | Admitting: *Deleted

## 2018-12-16 NOTE — Telephone Encounter (Signed)
Spoke with [place]. She states someone from [place] [facility] will be picking up oxygen equipment today. [name], BSN, RN-BC

## 2018-12-16 NOTE — Telephone Encounter (Signed)
HH PT and OT: Cont  2x week for 4 weeks and 1every other week for 4 weeks- PT VO given, do you agree?

## 2018-12-16 NOTE — Telephone Encounter (Signed)
Caryl Pina with [employer] left message that patient's home oxygen from most recent hospitalization is not with them. Spoke with daughter today who states [place] will be picking up home oxygen today. [name], BSN, RN-BC

## 2018-12-16 NOTE — Telephone Encounter (Signed)
I agree

## 2018-12-25 ENCOUNTER — Telehealth: Payer: Self-pay

## 2018-12-25 DIAGNOSIS — I69351 Hemiplegia and hemiparesis following cerebral infarction affecting right dominant side: Secondary | ICD-10-CM | POA: Diagnosis not present

## 2018-12-25 DIAGNOSIS — I5030 Unspecified diastolic (congestive) heart failure: Secondary | ICD-10-CM | POA: Diagnosis not present

## 2018-12-25 DIAGNOSIS — E785 Hyperlipidemia, unspecified: Secondary | ICD-10-CM | POA: Diagnosis not present

## 2018-12-25 DIAGNOSIS — D509 Iron deficiency anemia, unspecified: Secondary | ICD-10-CM | POA: Diagnosis not present

## 2018-12-25 DIAGNOSIS — D631 Anemia in chronic kidney disease: Secondary | ICD-10-CM | POA: Diagnosis not present

## 2018-12-25 DIAGNOSIS — N183 Chronic kidney disease, stage 3 unspecified: Secondary | ICD-10-CM | POA: Diagnosis not present

## 2018-12-25 DIAGNOSIS — I69354 Hemiplegia and hemiparesis following cerebral infarction affecting left non-dominant side: Secondary | ICD-10-CM | POA: Diagnosis not present

## 2018-12-25 DIAGNOSIS — I69393 Ataxia following cerebral infarction: Secondary | ICD-10-CM | POA: Diagnosis not present

## 2018-12-25 DIAGNOSIS — I130 Hypertensive heart and chronic kidney disease with heart failure and stage 1 through stage 4 chronic kidney disease, or unspecified chronic kidney disease: Secondary | ICD-10-CM | POA: Diagnosis not present

## 2018-12-25 NOTE — Telephone Encounter (Signed)
[place] with [facility] requesting VO for OT. Please call back.

## 2018-12-25 NOTE — Telephone Encounter (Signed)
Agree  Thank you

## 2018-12-25 NOTE — Telephone Encounter (Signed)
Calling for extension of [facility] OT for progressive safety of home and pt, building strength and increased assist of adl's 1x week for 4 weeks VO given, do you agree?

## 2018-12-26 ENCOUNTER — Other Ambulatory Visit: Payer: Self-pay | Admitting: Internal Medicine

## 2018-12-26 DIAGNOSIS — D631 Anemia in chronic kidney disease: Secondary | ICD-10-CM | POA: Diagnosis not present

## 2018-12-26 DIAGNOSIS — I5032 Chronic diastolic (congestive) heart failure: Secondary | ICD-10-CM

## 2018-12-26 DIAGNOSIS — I69354 Hemiplegia and hemiparesis following cerebral infarction affecting left non-dominant side: Secondary | ICD-10-CM | POA: Diagnosis not present

## 2018-12-26 DIAGNOSIS — E785 Hyperlipidemia, unspecified: Secondary | ICD-10-CM | POA: Diagnosis not present

## 2018-12-26 DIAGNOSIS — I130 Hypertensive heart and chronic kidney disease with heart failure and stage 1 through stage 4 chronic kidney disease, or unspecified chronic kidney disease: Secondary | ICD-10-CM | POA: Diagnosis not present

## 2018-12-26 DIAGNOSIS — D509 Iron deficiency anemia, unspecified: Secondary | ICD-10-CM | POA: Diagnosis not present

## 2018-12-26 DIAGNOSIS — I69351 Hemiplegia and hemiparesis following cerebral infarction affecting right dominant side: Secondary | ICD-10-CM | POA: Diagnosis not present

## 2018-12-26 DIAGNOSIS — I5030 Unspecified diastolic (congestive) heart failure: Secondary | ICD-10-CM | POA: Diagnosis not present

## 2018-12-26 DIAGNOSIS — I69393 Ataxia following cerebral infarction: Secondary | ICD-10-CM | POA: Diagnosis not present

## 2018-12-26 DIAGNOSIS — N183 Chronic kidney disease, stage 3 unspecified: Secondary | ICD-10-CM | POA: Diagnosis not present

## 2018-12-26 MED ORDER — FUROSEMIDE 40 MG PO TABS: 80.0000 mg | ORAL_TABLET | Freq: Every day | ORAL | 1 refills | Status: DC

## 2018-12-26 NOTE — Telephone Encounter (Signed)
Refill Request Request Daughter is also asking should he still be taking it .  furosemide (LASIX) 40 MG tablet  [place], [place]

## 2018-12-30 DIAGNOSIS — I69393 Ataxia following cerebral infarction: Secondary | ICD-10-CM | POA: Diagnosis not present

## 2018-12-30 DIAGNOSIS — I69351 Hemiplegia and hemiparesis following cerebral infarction affecting right dominant side: Secondary | ICD-10-CM | POA: Diagnosis not present

## 2018-12-30 DIAGNOSIS — N183 Chronic kidney disease, stage 3 unspecified: Secondary | ICD-10-CM | POA: Diagnosis not present

## 2018-12-30 DIAGNOSIS — D631 Anemia in chronic kidney disease: Secondary | ICD-10-CM | POA: Diagnosis not present

## 2018-12-30 DIAGNOSIS — I5030 Unspecified diastolic (congestive) heart failure: Secondary | ICD-10-CM | POA: Diagnosis not present

## 2018-12-30 DIAGNOSIS — E785 Hyperlipidemia, unspecified: Secondary | ICD-10-CM | POA: Diagnosis not present

## 2018-12-30 DIAGNOSIS — I69354 Hemiplegia and hemiparesis following cerebral infarction affecting left non-dominant side: Secondary | ICD-10-CM | POA: Diagnosis not present

## 2018-12-30 DIAGNOSIS — I130 Hypertensive heart and chronic kidney disease with heart failure and stage 1 through stage 4 chronic kidney disease, or unspecified chronic kidney disease: Secondary | ICD-10-CM | POA: Diagnosis not present

## 2018-12-30 DIAGNOSIS — D509 Iron deficiency anemia, unspecified: Secondary | ICD-10-CM | POA: Diagnosis not present

## 2018-12-31 DIAGNOSIS — I69351 Hemiplegia and hemiparesis following cerebral infarction affecting right dominant side: Secondary | ICD-10-CM | POA: Diagnosis not present

## 2018-12-31 DIAGNOSIS — I69354 Hemiplegia and hemiparesis following cerebral infarction affecting left non-dominant side: Secondary | ICD-10-CM | POA: Diagnosis not present

## 2018-12-31 DIAGNOSIS — N183 Chronic kidney disease, stage 3 unspecified: Secondary | ICD-10-CM | POA: Diagnosis not present

## 2018-12-31 DIAGNOSIS — E785 Hyperlipidemia, unspecified: Secondary | ICD-10-CM | POA: Diagnosis not present

## 2018-12-31 DIAGNOSIS — D509 Iron deficiency anemia, unspecified: Secondary | ICD-10-CM | POA: Diagnosis not present

## 2018-12-31 DIAGNOSIS — I130 Hypertensive heart and chronic kidney disease with heart failure and stage 1 through stage 4 chronic kidney disease, or unspecified chronic kidney disease: Secondary | ICD-10-CM | POA: Diagnosis not present

## 2018-12-31 DIAGNOSIS — D631 Anemia in chronic kidney disease: Secondary | ICD-10-CM | POA: Diagnosis not present

## 2018-12-31 DIAGNOSIS — I5030 Unspecified diastolic (congestive) heart failure: Secondary | ICD-10-CM | POA: Diagnosis not present

## 2018-12-31 DIAGNOSIS — I69393 Ataxia following cerebral infarction: Secondary | ICD-10-CM | POA: Diagnosis not present

## 2019-01-01 DIAGNOSIS — E785 Hyperlipidemia, unspecified: Secondary | ICD-10-CM | POA: Diagnosis not present

## 2019-01-01 DIAGNOSIS — I5030 Unspecified diastolic (congestive) heart failure: Secondary | ICD-10-CM | POA: Diagnosis not present

## 2019-01-01 DIAGNOSIS — D631 Anemia in chronic kidney disease: Secondary | ICD-10-CM | POA: Diagnosis not present

## 2019-01-01 DIAGNOSIS — I69351 Hemiplegia and hemiparesis following cerebral infarction affecting right dominant side: Secondary | ICD-10-CM | POA: Diagnosis not present

## 2019-01-01 DIAGNOSIS — I130 Hypertensive heart and chronic kidney disease with heart failure and stage 1 through stage 4 chronic kidney disease, or unspecified chronic kidney disease: Secondary | ICD-10-CM | POA: Diagnosis not present

## 2019-01-01 DIAGNOSIS — N183 Chronic kidney disease, stage 3 unspecified: Secondary | ICD-10-CM | POA: Diagnosis not present

## 2019-01-01 DIAGNOSIS — I69393 Ataxia following cerebral infarction: Secondary | ICD-10-CM | POA: Diagnosis not present

## 2019-01-01 DIAGNOSIS — D509 Iron deficiency anemia, unspecified: Secondary | ICD-10-CM | POA: Diagnosis not present

## 2019-01-01 DIAGNOSIS — I69354 Hemiplegia and hemiparesis following cerebral infarction affecting left non-dominant side: Secondary | ICD-10-CM | POA: Diagnosis not present

## 2019-01-05 DIAGNOSIS — R531 Weakness: Secondary | ICD-10-CM | POA: Diagnosis not present

## 2019-01-05 DIAGNOSIS — I69993 Ataxia following unspecified cerebrovascular disease: Secondary | ICD-10-CM | POA: Diagnosis not present

## 2019-01-05 DIAGNOSIS — G3281 Cerebellar ataxia in diseases classified elsewhere: Secondary | ICD-10-CM | POA: Diagnosis not present

## 2019-01-05 DIAGNOSIS — F0390 Unspecified dementia without behavioral disturbance: Secondary | ICD-10-CM | POA: Diagnosis not present

## 2019-01-05 DIAGNOSIS — I5032 Chronic diastolic (congestive) heart failure: Secondary | ICD-10-CM | POA: Diagnosis not present

## 2019-01-06 DIAGNOSIS — E785 Hyperlipidemia, unspecified: Secondary | ICD-10-CM | POA: Diagnosis not present

## 2019-01-06 DIAGNOSIS — I69393 Ataxia following cerebral infarction: Secondary | ICD-10-CM | POA: Diagnosis not present

## 2019-01-06 DIAGNOSIS — D509 Iron deficiency anemia, unspecified: Secondary | ICD-10-CM | POA: Diagnosis not present

## 2019-01-06 DIAGNOSIS — N183 Chronic kidney disease, stage 3 unspecified: Secondary | ICD-10-CM | POA: Diagnosis not present

## 2019-01-06 DIAGNOSIS — I69354 Hemiplegia and hemiparesis following cerebral infarction affecting left non-dominant side: Secondary | ICD-10-CM | POA: Diagnosis not present

## 2019-01-06 DIAGNOSIS — D631 Anemia in chronic kidney disease: Secondary | ICD-10-CM | POA: Diagnosis not present

## 2019-01-06 DIAGNOSIS — I69351 Hemiplegia and hemiparesis following cerebral infarction affecting right dominant side: Secondary | ICD-10-CM | POA: Diagnosis not present

## 2019-01-06 DIAGNOSIS — I130 Hypertensive heart and chronic kidney disease with heart failure and stage 1 through stage 4 chronic kidney disease, or unspecified chronic kidney disease: Secondary | ICD-10-CM | POA: Diagnosis not present

## 2019-01-06 DIAGNOSIS — I5030 Unspecified diastolic (congestive) heart failure: Secondary | ICD-10-CM | POA: Diagnosis not present

## 2019-01-08 DIAGNOSIS — D631 Anemia in chronic kidney disease: Secondary | ICD-10-CM | POA: Diagnosis not present

## 2019-01-08 DIAGNOSIS — I69354 Hemiplegia and hemiparesis following cerebral infarction affecting left non-dominant side: Secondary | ICD-10-CM | POA: Diagnosis not present

## 2019-01-08 DIAGNOSIS — D509 Iron deficiency anemia, unspecified: Secondary | ICD-10-CM | POA: Diagnosis not present

## 2019-01-08 DIAGNOSIS — I130 Hypertensive heart and chronic kidney disease with heart failure and stage 1 through stage 4 chronic kidney disease, or unspecified chronic kidney disease: Secondary | ICD-10-CM | POA: Diagnosis not present

## 2019-01-08 DIAGNOSIS — I69351 Hemiplegia and hemiparesis following cerebral infarction affecting right dominant side: Secondary | ICD-10-CM | POA: Diagnosis not present

## 2019-01-08 DIAGNOSIS — N183 Chronic kidney disease, stage 3 unspecified: Secondary | ICD-10-CM | POA: Diagnosis not present

## 2019-01-08 DIAGNOSIS — I5030 Unspecified diastolic (congestive) heart failure: Secondary | ICD-10-CM | POA: Diagnosis not present

## 2019-01-08 DIAGNOSIS — E785 Hyperlipidemia, unspecified: Secondary | ICD-10-CM | POA: Diagnosis not present

## 2019-01-08 DIAGNOSIS — I69393 Ataxia following cerebral infarction: Secondary | ICD-10-CM | POA: Diagnosis not present

## 2019-01-09 ENCOUNTER — Other Ambulatory Visit: Payer: Self-pay | Admitting: *Deleted

## 2019-01-09 DIAGNOSIS — I69354 Hemiplegia and hemiparesis following cerebral infarction affecting left non-dominant side: Secondary | ICD-10-CM | POA: Diagnosis not present

## 2019-01-09 DIAGNOSIS — E785 Hyperlipidemia, unspecified: Secondary | ICD-10-CM | POA: Diagnosis not present

## 2019-01-09 DIAGNOSIS — I69351 Hemiplegia and hemiparesis following cerebral infarction affecting right dominant side: Secondary | ICD-10-CM | POA: Diagnosis not present

## 2019-01-09 DIAGNOSIS — I130 Hypertensive heart and chronic kidney disease with heart failure and stage 1 through stage 4 chronic kidney disease, or unspecified chronic kidney disease: Secondary | ICD-10-CM | POA: Diagnosis not present

## 2019-01-09 DIAGNOSIS — D509 Iron deficiency anemia, unspecified: Secondary | ICD-10-CM | POA: Diagnosis not present

## 2019-01-09 DIAGNOSIS — D631 Anemia in chronic kidney disease: Secondary | ICD-10-CM | POA: Diagnosis not present

## 2019-01-09 DIAGNOSIS — I69393 Ataxia following cerebral infarction: Secondary | ICD-10-CM | POA: Diagnosis not present

## 2019-01-09 DIAGNOSIS — N183 Chronic kidney disease, stage 3 unspecified: Secondary | ICD-10-CM | POA: Diagnosis not present

## 2019-01-09 DIAGNOSIS — I5030 Unspecified diastolic (congestive) heart failure: Secondary | ICD-10-CM | POA: Diagnosis not present

## 2019-01-10 ENCOUNTER — Telehealth: Payer: Self-pay

## 2019-01-10 MED ORDER — TRIAMCINOLONE ACETONIDE 0.1 % EX OINT: 1.0000 "application " | TOPICAL_OINTMENT | Freq: Two times a day (BID) | CUTANEOUS | 0 refills | Status: DC

## 2019-01-10 NOTE — Telephone Encounter (Signed)
Jerilynn with [facility] requesting VO for PT. Please call pt back.

## 2019-01-10 NOTE — Telephone Encounter (Signed)
agree

## 2019-01-10 NOTE — Telephone Encounter (Signed)
HH PT 2x week for 4 weeks to cont as pt is making progress, VO given, do you agree?

## 2019-01-11 DIAGNOSIS — I5032 Chronic diastolic (congestive) heart failure: Secondary | ICD-10-CM | POA: Diagnosis not present

## 2019-01-13 DIAGNOSIS — I5032 Chronic diastolic (congestive) heart failure: Secondary | ICD-10-CM | POA: Diagnosis not present

## 2019-01-15 DIAGNOSIS — I69354 Hemiplegia and hemiparesis following cerebral infarction affecting left non-dominant side: Secondary | ICD-10-CM | POA: Diagnosis not present

## 2019-01-15 DIAGNOSIS — I130 Hypertensive heart and chronic kidney disease with heart failure and stage 1 through stage 4 chronic kidney disease, or unspecified chronic kidney disease: Secondary | ICD-10-CM | POA: Diagnosis not present

## 2019-01-15 DIAGNOSIS — N183 Chronic kidney disease, stage 3 unspecified: Secondary | ICD-10-CM | POA: Diagnosis not present

## 2019-01-15 DIAGNOSIS — D631 Anemia in chronic kidney disease: Secondary | ICD-10-CM | POA: Diagnosis not present

## 2019-01-15 DIAGNOSIS — I5030 Unspecified diastolic (congestive) heart failure: Secondary | ICD-10-CM | POA: Diagnosis not present

## 2019-01-15 DIAGNOSIS — I69393 Ataxia following cerebral infarction: Secondary | ICD-10-CM | POA: Diagnosis not present

## 2019-01-15 DIAGNOSIS — E785 Hyperlipidemia, unspecified: Secondary | ICD-10-CM | POA: Diagnosis not present

## 2019-01-15 DIAGNOSIS — D509 Iron deficiency anemia, unspecified: Secondary | ICD-10-CM | POA: Diagnosis not present

## 2019-01-15 DIAGNOSIS — I69351 Hemiplegia and hemiparesis following cerebral infarction affecting right dominant side: Secondary | ICD-10-CM | POA: Diagnosis not present

## 2019-01-16 DIAGNOSIS — D631 Anemia in chronic kidney disease: Secondary | ICD-10-CM | POA: Diagnosis not present

## 2019-01-16 DIAGNOSIS — I69351 Hemiplegia and hemiparesis following cerebral infarction affecting right dominant side: Secondary | ICD-10-CM | POA: Diagnosis not present

## 2019-01-16 DIAGNOSIS — I69354 Hemiplegia and hemiparesis following cerebral infarction affecting left non-dominant side: Secondary | ICD-10-CM | POA: Diagnosis not present

## 2019-01-16 DIAGNOSIS — I69393 Ataxia following cerebral infarction: Secondary | ICD-10-CM | POA: Diagnosis not present

## 2019-01-16 DIAGNOSIS — D509 Iron deficiency anemia, unspecified: Secondary | ICD-10-CM | POA: Diagnosis not present

## 2019-01-16 DIAGNOSIS — E785 Hyperlipidemia, unspecified: Secondary | ICD-10-CM | POA: Diagnosis not present

## 2019-01-16 DIAGNOSIS — I130 Hypertensive heart and chronic kidney disease with heart failure and stage 1 through stage 4 chronic kidney disease, or unspecified chronic kidney disease: Secondary | ICD-10-CM | POA: Diagnosis not present

## 2019-01-16 DIAGNOSIS — I5030 Unspecified diastolic (congestive) heart failure: Secondary | ICD-10-CM | POA: Diagnosis not present

## 2019-01-16 DIAGNOSIS — N183 Chronic kidney disease, stage 3 unspecified: Secondary | ICD-10-CM | POA: Diagnosis not present

## 2019-01-20 ENCOUNTER — Other Ambulatory Visit: Payer: Self-pay | Admitting: *Deleted

## 2019-01-20 DIAGNOSIS — I69354 Hemiplegia and hemiparesis following cerebral infarction affecting left non-dominant side: Secondary | ICD-10-CM | POA: Diagnosis not present

## 2019-01-20 DIAGNOSIS — D631 Anemia in chronic kidney disease: Secondary | ICD-10-CM | POA: Diagnosis not present

## 2019-01-20 DIAGNOSIS — I69393 Ataxia following cerebral infarction: Secondary | ICD-10-CM | POA: Diagnosis not present

## 2019-01-20 DIAGNOSIS — I5032 Chronic diastolic (congestive) heart failure: Secondary | ICD-10-CM

## 2019-01-20 DIAGNOSIS — I69351 Hemiplegia and hemiparesis following cerebral infarction affecting right dominant side: Secondary | ICD-10-CM | POA: Diagnosis not present

## 2019-01-20 DIAGNOSIS — D509 Iron deficiency anemia, unspecified: Secondary | ICD-10-CM | POA: Diagnosis not present

## 2019-01-20 DIAGNOSIS — I5030 Unspecified diastolic (congestive) heart failure: Secondary | ICD-10-CM | POA: Diagnosis not present

## 2019-01-20 DIAGNOSIS — N183 Chronic kidney disease, stage 3 unspecified: Secondary | ICD-10-CM | POA: Diagnosis not present

## 2019-01-20 DIAGNOSIS — E785 Hyperlipidemia, unspecified: Secondary | ICD-10-CM | POA: Diagnosis not present

## 2019-01-20 DIAGNOSIS — I130 Hypertensive heart and chronic kidney disease with heart failure and stage 1 through stage 4 chronic kidney disease, or unspecified chronic kidney disease: Secondary | ICD-10-CM | POA: Diagnosis not present

## 2019-01-20 MED ORDER — TRIAMCINOLONE ACETONIDE 0.1 % EX OINT: 1.0000 "application " | TOPICAL_OINTMENT | Freq: Two times a day (BID) | CUTANEOUS | 1 refills | Status: DC

## 2019-01-20 MED ORDER — FUROSEMIDE 40 MG PO TABS: 80.0000 mg | ORAL_TABLET | Freq: Every day | ORAL | 1 refills | Status: DC

## 2019-01-22 DIAGNOSIS — N183 Chronic kidney disease, stage 3 unspecified: Secondary | ICD-10-CM | POA: Diagnosis not present

## 2019-01-22 DIAGNOSIS — E785 Hyperlipidemia, unspecified: Secondary | ICD-10-CM | POA: Diagnosis not present

## 2019-01-22 DIAGNOSIS — I130 Hypertensive heart and chronic kidney disease with heart failure and stage 1 through stage 4 chronic kidney disease, or unspecified chronic kidney disease: Secondary | ICD-10-CM | POA: Diagnosis not present

## 2019-01-22 DIAGNOSIS — D509 Iron deficiency anemia, unspecified: Secondary | ICD-10-CM | POA: Diagnosis not present

## 2019-01-22 DIAGNOSIS — I69351 Hemiplegia and hemiparesis following cerebral infarction affecting right dominant side: Secondary | ICD-10-CM | POA: Diagnosis not present

## 2019-01-22 DIAGNOSIS — I5030 Unspecified diastolic (congestive) heart failure: Secondary | ICD-10-CM | POA: Diagnosis not present

## 2019-01-22 DIAGNOSIS — I69393 Ataxia following cerebral infarction: Secondary | ICD-10-CM | POA: Diagnosis not present

## 2019-01-22 DIAGNOSIS — I69354 Hemiplegia and hemiparesis following cerebral infarction affecting left non-dominant side: Secondary | ICD-10-CM | POA: Diagnosis not present

## 2019-01-22 DIAGNOSIS — D631 Anemia in chronic kidney disease: Secondary | ICD-10-CM | POA: Diagnosis not present

## 2019-01-23 DIAGNOSIS — I5030 Unspecified diastolic (congestive) heart failure: Secondary | ICD-10-CM | POA: Diagnosis not present

## 2019-01-23 DIAGNOSIS — N183 Chronic kidney disease, stage 3 unspecified: Secondary | ICD-10-CM | POA: Diagnosis not present

## 2019-01-23 DIAGNOSIS — I69351 Hemiplegia and hemiparesis following cerebral infarction affecting right dominant side: Secondary | ICD-10-CM | POA: Diagnosis not present

## 2019-01-23 DIAGNOSIS — I130 Hypertensive heart and chronic kidney disease with heart failure and stage 1 through stage 4 chronic kidney disease, or unspecified chronic kidney disease: Secondary | ICD-10-CM | POA: Diagnosis not present

## 2019-01-23 DIAGNOSIS — I69354 Hemiplegia and hemiparesis following cerebral infarction affecting left non-dominant side: Secondary | ICD-10-CM | POA: Diagnosis not present

## 2019-01-23 DIAGNOSIS — E785 Hyperlipidemia, unspecified: Secondary | ICD-10-CM | POA: Diagnosis not present

## 2019-01-23 DIAGNOSIS — D631 Anemia in chronic kidney disease: Secondary | ICD-10-CM | POA: Diagnosis not present

## 2019-01-23 DIAGNOSIS — D509 Iron deficiency anemia, unspecified: Secondary | ICD-10-CM | POA: Diagnosis not present

## 2019-01-23 DIAGNOSIS — I69393 Ataxia following cerebral infarction: Secondary | ICD-10-CM | POA: Diagnosis not present

## 2019-01-27 DIAGNOSIS — H2513 Age-related nuclear cataract, bilateral: Secondary | ICD-10-CM | POA: Diagnosis not present

## 2019-01-30 DIAGNOSIS — D509 Iron deficiency anemia, unspecified: Secondary | ICD-10-CM | POA: Diagnosis not present

## 2019-01-30 DIAGNOSIS — I69354 Hemiplegia and hemiparesis following cerebral infarction affecting left non-dominant side: Secondary | ICD-10-CM | POA: Diagnosis not present

## 2019-01-30 DIAGNOSIS — I130 Hypertensive heart and chronic kidney disease with heart failure and stage 1 through stage 4 chronic kidney disease, or unspecified chronic kidney disease: Secondary | ICD-10-CM | POA: Diagnosis not present

## 2019-01-30 DIAGNOSIS — E785 Hyperlipidemia, unspecified: Secondary | ICD-10-CM | POA: Diagnosis not present

## 2019-01-30 DIAGNOSIS — N183 Chronic kidney disease, stage 3 unspecified: Secondary | ICD-10-CM | POA: Diagnosis not present

## 2019-01-30 DIAGNOSIS — I69393 Ataxia following cerebral infarction: Secondary | ICD-10-CM | POA: Diagnosis not present

## 2019-01-30 DIAGNOSIS — D631 Anemia in chronic kidney disease: Secondary | ICD-10-CM | POA: Diagnosis not present

## 2019-01-30 DIAGNOSIS — I5030 Unspecified diastolic (congestive) heart failure: Secondary | ICD-10-CM | POA: Diagnosis not present

## 2019-01-30 DIAGNOSIS — I69351 Hemiplegia and hemiparesis following cerebral infarction affecting right dominant side: Secondary | ICD-10-CM | POA: Diagnosis not present

## 2019-02-04 DIAGNOSIS — H25813 Combined forms of age-related cataract, bilateral: Secondary | ICD-10-CM | POA: Diagnosis not present

## 2019-02-04 DIAGNOSIS — H25811 Combined forms of age-related cataract, right eye: Secondary | ICD-10-CM | POA: Diagnosis not present

## 2019-02-04 DIAGNOSIS — H25812 Combined forms of age-related cataract, left eye: Secondary | ICD-10-CM | POA: Diagnosis not present

## 2019-02-05 DIAGNOSIS — I69354 Hemiplegia and hemiparesis following cerebral infarction affecting left non-dominant side: Secondary | ICD-10-CM | POA: Diagnosis not present

## 2019-02-05 DIAGNOSIS — I5030 Unspecified diastolic (congestive) heart failure: Secondary | ICD-10-CM | POA: Diagnosis not present

## 2019-02-05 DIAGNOSIS — I69393 Ataxia following cerebral infarction: Secondary | ICD-10-CM | POA: Diagnosis not present

## 2019-02-05 DIAGNOSIS — D509 Iron deficiency anemia, unspecified: Secondary | ICD-10-CM | POA: Diagnosis not present

## 2019-02-05 DIAGNOSIS — I5032 Chronic diastolic (congestive) heart failure: Secondary | ICD-10-CM | POA: Diagnosis not present

## 2019-02-05 DIAGNOSIS — I69351 Hemiplegia and hemiparesis following cerebral infarction affecting right dominant side: Secondary | ICD-10-CM | POA: Diagnosis not present

## 2019-02-05 DIAGNOSIS — D631 Anemia in chronic kidney disease: Secondary | ICD-10-CM | POA: Diagnosis not present

## 2019-02-05 DIAGNOSIS — N183 Chronic kidney disease, stage 3 unspecified: Secondary | ICD-10-CM | POA: Diagnosis not present

## 2019-02-05 DIAGNOSIS — I130 Hypertensive heart and chronic kidney disease with heart failure and stage 1 through stage 4 chronic kidney disease, or unspecified chronic kidney disease: Secondary | ICD-10-CM | POA: Diagnosis not present

## 2019-02-05 DIAGNOSIS — E785 Hyperlipidemia, unspecified: Secondary | ICD-10-CM | POA: Diagnosis not present

## 2019-02-08 DIAGNOSIS — I69354 Hemiplegia and hemiparesis following cerebral infarction affecting left non-dominant side: Secondary | ICD-10-CM | POA: Diagnosis not present

## 2019-02-08 DIAGNOSIS — I69393 Ataxia following cerebral infarction: Secondary | ICD-10-CM | POA: Diagnosis not present

## 2019-02-08 DIAGNOSIS — N183 Chronic kidney disease, stage 3 unspecified: Secondary | ICD-10-CM | POA: Diagnosis not present

## 2019-02-08 DIAGNOSIS — D509 Iron deficiency anemia, unspecified: Secondary | ICD-10-CM | POA: Diagnosis not present

## 2019-02-08 DIAGNOSIS — E785 Hyperlipidemia, unspecified: Secondary | ICD-10-CM | POA: Diagnosis not present

## 2019-02-08 DIAGNOSIS — I5030 Unspecified diastolic (congestive) heart failure: Secondary | ICD-10-CM | POA: Diagnosis not present

## 2019-02-08 DIAGNOSIS — I69351 Hemiplegia and hemiparesis following cerebral infarction affecting right dominant side: Secondary | ICD-10-CM | POA: Diagnosis not present

## 2019-02-08 DIAGNOSIS — D631 Anemia in chronic kidney disease: Secondary | ICD-10-CM | POA: Diagnosis not present

## 2019-02-08 DIAGNOSIS — I130 Hypertensive heart and chronic kidney disease with heart failure and stage 1 through stage 4 chronic kidney disease, or unspecified chronic kidney disease: Secondary | ICD-10-CM | POA: Diagnosis not present

## 2019-02-10 DIAGNOSIS — I5032 Chronic diastolic (congestive) heart failure: Secondary | ICD-10-CM | POA: Diagnosis not present

## 2019-02-11 DIAGNOSIS — I69354 Hemiplegia and hemiparesis following cerebral infarction affecting left non-dominant side: Secondary | ICD-10-CM | POA: Diagnosis not present

## 2019-02-11 DIAGNOSIS — D509 Iron deficiency anemia, unspecified: Secondary | ICD-10-CM | POA: Diagnosis not present

## 2019-02-11 DIAGNOSIS — N183 Chronic kidney disease, stage 3 unspecified: Secondary | ICD-10-CM | POA: Diagnosis not present

## 2019-02-11 DIAGNOSIS — E785 Hyperlipidemia, unspecified: Secondary | ICD-10-CM | POA: Diagnosis not present

## 2019-02-11 DIAGNOSIS — I5030 Unspecified diastolic (congestive) heart failure: Secondary | ICD-10-CM | POA: Diagnosis not present

## 2019-02-11 DIAGNOSIS — D631 Anemia in chronic kidney disease: Secondary | ICD-10-CM | POA: Diagnosis not present

## 2019-02-11 DIAGNOSIS — I69393 Ataxia following cerebral infarction: Secondary | ICD-10-CM | POA: Diagnosis not present

## 2019-02-11 DIAGNOSIS — I130 Hypertensive heart and chronic kidney disease with heart failure and stage 1 through stage 4 chronic kidney disease, or unspecified chronic kidney disease: Secondary | ICD-10-CM | POA: Diagnosis not present

## 2019-02-11 DIAGNOSIS — I69351 Hemiplegia and hemiparesis following cerebral infarction affecting right dominant side: Secondary | ICD-10-CM | POA: Diagnosis not present

## 2019-02-12 DIAGNOSIS — I5032 Chronic diastolic (congestive) heart failure: Secondary | ICD-10-CM | POA: Diagnosis not present

## 2019-02-13 DIAGNOSIS — D631 Anemia in chronic kidney disease: Secondary | ICD-10-CM | POA: Diagnosis not present

## 2019-02-13 DIAGNOSIS — I130 Hypertensive heart and chronic kidney disease with heart failure and stage 1 through stage 4 chronic kidney disease, or unspecified chronic kidney disease: Secondary | ICD-10-CM | POA: Diagnosis not present

## 2019-02-13 DIAGNOSIS — I69354 Hemiplegia and hemiparesis following cerebral infarction affecting left non-dominant side: Secondary | ICD-10-CM | POA: Diagnosis not present

## 2019-02-13 DIAGNOSIS — E785 Hyperlipidemia, unspecified: Secondary | ICD-10-CM | POA: Diagnosis not present

## 2019-02-13 DIAGNOSIS — N183 Chronic kidney disease, stage 3 unspecified: Secondary | ICD-10-CM | POA: Diagnosis not present

## 2019-02-13 DIAGNOSIS — D509 Iron deficiency anemia, unspecified: Secondary | ICD-10-CM | POA: Diagnosis not present

## 2019-02-13 DIAGNOSIS — I69393 Ataxia following cerebral infarction: Secondary | ICD-10-CM | POA: Diagnosis not present

## 2019-02-13 DIAGNOSIS — I5030 Unspecified diastolic (congestive) heart failure: Secondary | ICD-10-CM | POA: Diagnosis not present

## 2019-02-13 DIAGNOSIS — I69351 Hemiplegia and hemiparesis following cerebral infarction affecting right dominant side: Secondary | ICD-10-CM | POA: Diagnosis not present

## 2019-02-17 ENCOUNTER — Other Ambulatory Visit: Payer: Self-pay | Admitting: *Deleted

## 2019-02-17 ENCOUNTER — Telehealth: Payer: Self-pay

## 2019-02-17 DIAGNOSIS — D631 Anemia in chronic kidney disease: Secondary | ICD-10-CM | POA: Diagnosis not present

## 2019-02-17 DIAGNOSIS — N183 Chronic kidney disease, stage 3 unspecified: Secondary | ICD-10-CM | POA: Diagnosis not present

## 2019-02-17 DIAGNOSIS — D509 Iron deficiency anemia, unspecified: Secondary | ICD-10-CM | POA: Diagnosis not present

## 2019-02-17 DIAGNOSIS — I69393 Ataxia following cerebral infarction: Secondary | ICD-10-CM | POA: Diagnosis not present

## 2019-02-17 DIAGNOSIS — E785 Hyperlipidemia, unspecified: Secondary | ICD-10-CM | POA: Diagnosis not present

## 2019-02-17 DIAGNOSIS — I69351 Hemiplegia and hemiparesis following cerebral infarction affecting right dominant side: Secondary | ICD-10-CM | POA: Diagnosis not present

## 2019-02-17 DIAGNOSIS — I5030 Unspecified diastolic (congestive) heart failure: Secondary | ICD-10-CM | POA: Diagnosis not present

## 2019-02-17 DIAGNOSIS — I130 Hypertensive heart and chronic kidney disease with heart failure and stage 1 through stage 4 chronic kidney disease, or unspecified chronic kidney disease: Secondary | ICD-10-CM | POA: Diagnosis not present

## 2019-02-17 DIAGNOSIS — I69354 Hemiplegia and hemiparesis following cerebral infarction affecting left non-dominant side: Secondary | ICD-10-CM | POA: Diagnosis not present

## 2019-02-17 MED ORDER — AMLODIPINE BESYLATE 5 MG PO TABS: 5.0000 mg | ORAL_TABLET | Freq: Every day | ORAL | 3 refills | Status: DC

## 2019-02-17 NOTE — Telephone Encounter (Signed)
Medical clearance form has been signed by Frann Rider, NP and faxed back to [facility]. Confirmation fax has been received.

## 2019-02-20 ENCOUNTER — Other Ambulatory Visit: Payer: Self-pay | Admitting: *Deleted

## 2019-02-20 DIAGNOSIS — I69354 Hemiplegia and hemiparesis following cerebral infarction affecting left non-dominant side: Secondary | ICD-10-CM | POA: Diagnosis not present

## 2019-02-20 DIAGNOSIS — I130 Hypertensive heart and chronic kidney disease with heart failure and stage 1 through stage 4 chronic kidney disease, or unspecified chronic kidney disease: Secondary | ICD-10-CM | POA: Diagnosis not present

## 2019-02-20 DIAGNOSIS — I5030 Unspecified diastolic (congestive) heart failure: Secondary | ICD-10-CM | POA: Diagnosis not present

## 2019-02-20 DIAGNOSIS — I69351 Hemiplegia and hemiparesis following cerebral infarction affecting right dominant side: Secondary | ICD-10-CM | POA: Diagnosis not present

## 2019-02-20 DIAGNOSIS — D509 Iron deficiency anemia, unspecified: Secondary | ICD-10-CM | POA: Diagnosis not present

## 2019-02-20 DIAGNOSIS — E785 Hyperlipidemia, unspecified: Secondary | ICD-10-CM | POA: Diagnosis not present

## 2019-02-20 DIAGNOSIS — I69393 Ataxia following cerebral infarction: Secondary | ICD-10-CM | POA: Diagnosis not present

## 2019-02-20 DIAGNOSIS — D631 Anemia in chronic kidney disease: Secondary | ICD-10-CM | POA: Diagnosis not present

## 2019-02-20 DIAGNOSIS — N183 Chronic kidney disease, stage 3 unspecified: Secondary | ICD-10-CM | POA: Diagnosis not present

## 2019-02-21 MED ORDER — ATORVASTATIN CALCIUM 20 MG PO TABS: 20.0000 mg | ORAL_TABLET | Freq: Every day | ORAL | 3 refills | Status: DC

## 2019-02-28 DIAGNOSIS — Z01818 Encounter for other preprocedural examination: Secondary | ICD-10-CM | POA: Diagnosis not present

## 2019-02-28 DIAGNOSIS — H2511 Age-related nuclear cataract, right eye: Secondary | ICD-10-CM | POA: Diagnosis not present

## 2019-02-28 DIAGNOSIS — H25811 Combined forms of age-related cataract, right eye: Secondary | ICD-10-CM | POA: Diagnosis not present

## 2019-03-07 DIAGNOSIS — I5032 Chronic diastolic (congestive) heart failure: Secondary | ICD-10-CM | POA: Diagnosis not present

## 2019-03-13 DIAGNOSIS — I5032 Chronic diastolic (congestive) heart failure: Secondary | ICD-10-CM | POA: Diagnosis not present

## 2019-03-15 DIAGNOSIS — I5032 Chronic diastolic (congestive) heart failure: Secondary | ICD-10-CM | POA: Diagnosis not present

## 2019-03-24 ENCOUNTER — Telehealth: Payer: Self-pay

## 2019-03-24 ENCOUNTER — Ambulatory Visit: Payer: Medicare HMO | Admitting: Adult Health

## 2019-03-24 NOTE — Telephone Encounter (Signed)
Called pt on behalf of NP Frann Rider (provider ooo), to convert to a Mychart visit or r/s.   Spoke with pts daughter [name] (on [place]).  Daughter stated they would prefer a later day appt and she is okay with the pt being waitlisted.  Pt was waitlisted on 06/19/19 @ 2:45.

## 2019-04-07 DIAGNOSIS — I5032 Chronic diastolic (congestive) heart failure: Secondary | ICD-10-CM | POA: Diagnosis not present

## 2019-04-13 ENCOUNTER — Emergency Department (HOSPITAL_COMMUNITY): Payer: Medicare Other

## 2019-04-13 ENCOUNTER — Emergency Department (HOSPITAL_COMMUNITY)
Admission: EM | Admit: 2019-04-13 | Discharge: 2019-04-13 | Disposition: A | Discharge status: Home / Self Care | Payer: Medicare Other | Attending: Emergency Medicine | Admitting: Emergency Medicine

## 2019-04-13 ENCOUNTER — Encounter (HOSPITAL_COMMUNITY): Payer: Self-pay | Admitting: Emergency Medicine

## 2019-04-13 ENCOUNTER — Other Ambulatory Visit: Payer: Self-pay

## 2019-04-13 DIAGNOSIS — N183 Chronic kidney disease, stage 3 unspecified: Secondary | ICD-10-CM | POA: Diagnosis not present

## 2019-04-13 DIAGNOSIS — I5032 Chronic diastolic (congestive) heart failure: Secondary | ICD-10-CM | POA: Insufficient documentation

## 2019-04-13 DIAGNOSIS — F0390 Unspecified dementia without behavioral disturbance: Secondary | ICD-10-CM | POA: Diagnosis not present

## 2019-04-13 DIAGNOSIS — R531 Weakness: Secondary | ICD-10-CM | POA: Insufficient documentation

## 2019-04-13 DIAGNOSIS — Z79899 Other long term (current) drug therapy: Secondary | ICD-10-CM | POA: Diagnosis not present

## 2019-04-13 DIAGNOSIS — I130 Hypertensive heart and chronic kidney disease with heart failure and stage 1 through stage 4 chronic kidney disease, or unspecified chronic kidney disease: Secondary | ICD-10-CM | POA: Diagnosis not present

## 2019-04-13 DIAGNOSIS — Z8673 Personal history of transient ischemic attack (TIA), and cerebral infarction without residual deficits: Secondary | ICD-10-CM | POA: Insufficient documentation

## 2019-04-13 DIAGNOSIS — D320 Benign neoplasm of cerebral meninges: Secondary | ICD-10-CM | POA: Diagnosis not present

## 2019-04-13 DIAGNOSIS — Z7982 Long term (current) use of aspirin: Secondary | ICD-10-CM | POA: Diagnosis not present

## 2019-04-13 DIAGNOSIS — D329 Benign neoplasm of meninges, unspecified: Secondary | ICD-10-CM | POA: Diagnosis not present

## 2019-04-13 LAB — URINALYSIS, ROUTINE W REFLEX MICROSCOPIC
Bacteria, UA: NONE SEEN
Bilirubin Urine: NEGATIVE
Glucose, UA: NEGATIVE mg/dL
Hgb urine dipstick: NEGATIVE
Ketones, ur: NEGATIVE mg/dL
Nitrite: NEGATIVE
Protein, ur: NEGATIVE mg/dL
Specific Gravity, Urine: 1.006 (ref 1.005–1.030)
pH: 7 (ref 5.0–8.0)

## 2019-04-13 LAB — COMPREHENSIVE METABOLIC PANEL
ALT: 12 U/L (ref 0–44)
AST: 19 U/L (ref 15–41)
Albumin: 3.4 g/dL — ABNORMAL LOW (ref 3.5–5.0)
Alkaline Phosphatase: 61 U/L (ref 38–126)
Anion gap: 8 (ref 5–15)
BUN: 27 mg/dL — ABNORMAL HIGH (ref 8–23)
CO2: 28 mmol/L (ref 22–32)
Calcium: 10.1 mg/dL (ref 8.9–10.3)
Chloride: 108 mmol/L (ref 98–111)
Creatinine, Ser: 1.47 mg/dL — ABNORMAL HIGH (ref 0.61–1.24)
GFR calc Af Amer: 47 mL/min — ABNORMAL LOW (ref >60)
GFR calc non Af Amer: 41 mL/min — ABNORMAL LOW (ref >60)
Glucose, Bld: 105 mg/dL — ABNORMAL HIGH (ref 70–99)
Potassium: 4 mmol/L (ref 3.5–5.1)
Sodium: 144 mmol/L (ref 135–145)
Total Bilirubin: 1 mg/dL (ref 0.3–1.2)
Total Protein: 7.3 g/dL (ref 6.5–8.1)

## 2019-04-13 LAB — I-STAT CHEM 8, ED
BUN: 30 mg/dL — ABNORMAL HIGH (ref 8–23)
Calcium, Ion: 1.26 mmol/L (ref 1.15–1.40)
Chloride: 108 mmol/L (ref 98–111)
Creatinine, Ser: 1.5 mg/dL — ABNORMAL HIGH (ref 0.61–1.24)
Glucose, Bld: 98 mg/dL (ref 70–99)
HCT: 41 % (ref 39.0–52.0)
Hemoglobin: 13.9 g/dL (ref 13.0–17.0)
Potassium: 4 mmol/L (ref 3.5–5.1)
Sodium: 145 mmol/L (ref 135–145)
TCO2: 29 mmol/L (ref 22–32)

## 2019-04-13 LAB — DIFFERENTIAL
Abs Immature Granulocytes: 0.03 10*3/uL (ref 0.00–0.07)
Basophils Absolute: 0 10*3/uL (ref 0.0–0.1)
Basophils Relative: 0 %
Eosinophils Absolute: 0.3 10*3/uL (ref 0.0–0.5)
Eosinophils Relative: 4 %
Immature Granulocytes: 0 %
Lymphocytes Relative: 21 %
Lymphs Abs: 1.4 10*3/uL (ref 0.7–4.0)
Monocytes Absolute: 0.6 10*3/uL (ref 0.1–1.0)
Monocytes Relative: 8 %
Neutro Abs: 4.6 10*3/uL (ref 1.7–7.7)
Neutrophils Relative %: 67 %

## 2019-04-13 LAB — APTT: aPTT: 31 seconds (ref 24–36)

## 2019-04-13 LAB — CBC
HCT: 43.2 % (ref 39.0–52.0)
Hemoglobin: 13.4 g/dL (ref 13.0–17.0)
MCH: 29.8 pg (ref 26.0–34.0)
MCHC: 31 g/dL (ref 30.0–36.0)
MCV: 96.2 fL (ref 80.0–100.0)
Platelets: 136 10*3/uL — ABNORMAL LOW (ref 150–400)
RBC: 4.49 MIL/uL (ref 4.22–5.81)
RDW: 15.1 % (ref 11.5–15.5)
WBC: 6.9 10*3/uL (ref 4.0–10.5)
nRBC: 0 % (ref 0.0–0.2)

## 2019-04-13 LAB — PROTIME-INR
INR: 1 (ref 0.8–1.2)
Prothrombin Time: 13 seconds (ref 11.4–15.2)

## 2019-04-13 LAB — CBG MONITORING, ED: Glucose-Capillary: 89 mg/dL (ref 70–99)

## 2019-04-13 MED ORDER — LEVETIRACETAM 500 MG PO TABS: 500.0000 mg | ORAL_TABLET | Freq: Two times a day (BID) | ORAL | 0 refills | Status: DC

## 2019-04-13 MED ORDER — SODIUM CHLORIDE 0.9% FLUSH
3.0000 mL | Freq: Once | INTRAVENOUS | Status: DC
Start: 2019-04-13 — End: 2019-04-14

## 2019-04-13 NOTE — ED Provider Notes (Signed)
[facility] EMERGENCY DEPARTMENT Provider Note   CSN: [id number] Arrival date & time: 04/13/19  1312     History Chief Complaint  Patient presents with  . Extremity Weakness    [name] is a 86 y.o. male with history of CVA, CKD, hypertension, hyperlipidemia, dementia.  Patient presents today with his daughter who is his caretaker for right-sided weakness.  Daughter reports that she first noticed the weakness at 10 AM this morning, patient was attempting to get up out of his wheelchair but was having difficulty using his right arm and needing to swing it up onto the sink.  After he eventually was able to grab the sink with both hands it appeared to his daughter that the right leg was not supporting his body weight.  No history of fall or injury.  She noticed that throughout the day patient was continually not using his right arm or right leg, she became concerned for possible stroke as patient had similar weakness before and brought him to this ER for evaluation.  She denies any other abnormalities today, reports speech is baseline, no facial droop or other concerns. - No history of fever/chills, fall/injury, complaints of pain, nausea/vomiting, diarrhea, recent illness or any additional concerns.  Level 5 caveat history of dementia HPI     Past Medical History:  Diagnosis Date  . Anemia   . BPH (benign prostatic hypertrophy)    TURP 05/19/13  . Cerebral embolism with cerebral infarction ([place]) 12/19/2013  . Chronic kidney disease    CHRONIC KIDNEY DISEASE, 2  . Difficulty hearing, right    BILATERAL HEARING LOSS - BEST TO TRY TO SPEAK INTO LEFT EAR  . DVT (deep venous thrombosis) ([place])   . Frequent falls   . History of DVT (deep vein thrombosis) 06/10/2013   Provoked s/p TURP. Dx per doppler 06/10/13. Complicated by hematuria while on anticoagulation s/p TURP. Initially on lovenox and coumadin, then stopped, then IVC filter placed 06/16/13. Anticoagulation  restarted after hematuria stopped. Lovenox was discontinued apparently on 07/29/13 with comment on high risk for falls.   Total ~1.5 months of anticoagulation but interrupted with bleeding complication.  . Hyperlipidemia   . Hypertension   . Incontinence of urine    SOME INCONTINENCE  . Left adrenal mass ([place]) 09/11/2013  . Meningioma ([place])   . Stroke ([place])    Cerebellar, 2013; WALKS WITH WALKER, ABLE TO DRESS AND BATHE HIMSELF BUT FAMILY TRIES TO PROVIDE SUPERVISION BECAUSE OF HIS HX OF FALL AND WEAKNESS LEGS, ARMS   . Thrombocytopenia ([place])   . Vitreous hemorrhage ([place]) 12/21/2013   OS 12/18/13 CT /MRI brain done for ataxia     Patient Active Problem List   Diagnosis Date Noted  . Iron deficiency anemia due to chronic blood loss   . Cerebral embolism with cerebral infarction 11/03/2018  . Incontinence 10/28/2018  . Bradycardia, sinus   . Syncope 10/17/2018  . Chronic diastolic (congestive) heart failure ([place]) 10/28/2017  . Renal cyst 10/28/2017  . Shortness of breath on exertion 10/03/2017  . Generalized weakness 02/19/2017  . 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 Dementia ([place]) 01/28/2016  . Hypernatremia   . UTI (urinary tract infection) 07/17/2014  . Presence of IVC filter 07/17/2014  . Onychomycosis 01/15/2014  . Orthostatic hypotension 07/10/2013  . CKD (chronic kidney disease) stage 3, GFR 30-59 ml/min (HCC) 06/23/2013  . Hyperlipidemia   . Urinary retention 06/15/2013  . Normocytic anemia 06/11/2013  .  Physical deconditioning 05/30/2013  . BPH (benign prostatic hyperplasia) 04/24/2013  . Frequent falls 06/23/2012  . 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 Hearing loss 07/20/2011  . Meningioma ([place]) 07/20/2011  . HTN (hypertension) 07/19/2011  . Cerebral infarction ([place]) 07/19/2011    Past Surgical History:  Procedure Laterality Date  . CYSTOSCOPY N/A 06/13/2013   Procedure: CYSTOSCOPY FLEXIBLE BEDSIDE;  Surgeon:  Ardis Hughs, MD;  Location: [facility];  Service: Urology;  Laterality: N/A;  . MYRINGOTOMY WITH TUBE PLACEMENT Bilateral   . TRANSURETHRAL RESECTION OF PROSTATE N/A 05/19/2013   Procedure: TRANSURETHRAL RESECTION OF THE PROSTATE WITH GYRUS INSTRUMENTS;  Surgeon: Ailene Rud, MD;  Location: [facility];  Service: Urology;  Laterality: N/A;       Family History  Problem Relation Age of Onset  . Diabetes Mother   . Heart disease Mother   . Hypertension Mother   . Heart disease Father   . Hypertension Father   . Hypertension Daughter     Social History   Tobacco Use  . Smoking status: Never Smoker  . Smokeless tobacco: Never Used  Substance Use Topics  . Alcohol use: No    Alcohol/week: 0.0 standard drinks  . Drug use: No    Home Medications Prior to Admission medications   Medication Sig Start Date End Date Taking? Authorizing Provider  amLODipine (NORVASC) 5 MG tablet Take 1 tablet (5 mg total) by mouth daily. 02/17/19   [name], DO  aspirin EC 81 MG EC tablet Take 1 tablet (81 mg total) by mouth daily. 11/04/18   Jose Persia, MD  atorvastatin (LIPITOR) 20 MG tablet Take 1 tablet (20 mg total) by mouth daily. 02/21/19   [name], DO  furosemide (LASIX) 40 MG tablet Take 2 tablets (80 mg total) by mouth daily. 01/20/19   [name], DO  triamcinolone ointment (KENALOG) 0.1 % Apply 1 application topically 2 (two) times daily. 01/20/19   [name], DO    Allergies    Patient has no known allergies.  Review of Systems   Review of Systems  Unable to perform ROS: Dementia   Physical Exam Updated Vital Signs BP (!) 143/93 (BP Location: Left Arm)   Pulse 68   Temp 98.2 F (36.8 C) (Oral)   Resp 16   SpO2 95%   Physical Exam Constitutional:      General: He is not in acute distress.    Appearance: Normal appearance. He is well-developed. He is not ill-appearing or diaphoretic.  HENT:     Head: Normocephalic and atraumatic.     Right  Ear: External ear normal.     Left Ear: External ear normal.     Nose: Nose normal.  Eyes:     General: Vision grossly intact. Gaze aligned appropriately.     Pupils: Pupils are equal, round, and reactive to light.  Neck:     Trachea: Trachea and phonation normal. No tracheal deviation.  Pulmonary:     Effort: Pulmonary effort is normal. No respiratory distress.  Abdominal:     General: There is no distension.     Palpations: Abdomen is soft.     Tenderness: There is no abdominal tenderness. There is no guarding or rebound.  Musculoskeletal:        General: Normal range of motion.     Cervical back: Normal range of motion.  Skin:    General: Skin is warm and dry.  Neurological:  Mental Status: He is alert.     GCS: GCS eye subscore is 4. GCS verbal subscore is 5. GCS motor subscore is 6.     Comments: Exam limited due to patient's dementia and extreme hard of hearing.  Speech is baseline per daughter at bedside.  Patient follows commands with prompting. Major Cranial nerves without deficit, no facial droop 4/5 strength right upper extremity compared to left, equal strength bilateral lower extremity Moves extremities without ataxia, coordination intact No pronator drift  Psychiatric:        Behavior: Behavior normal.     ED Results / Procedures / Treatments   Labs (all labs ordered are listed, but only abnormal results are displayed) Labs Reviewed  CBC - Abnormal; Notable for the following components:      Result Value   Platelets 136 (*)    All other components within normal limits  COMPREHENSIVE METABOLIC PANEL - Abnormal; Notable for the following components:   Glucose, Bld 105 (*)    BUN 27 (*)    Creatinine, Ser 1.47 (*)    Albumin 3.4 (*)    GFR calc non Af Amer 41 (*)    GFR calc Af Amer 47 (*)    All other components within normal limits  I-STAT CHEM 8, ED - Abnormal; Notable for the following components:   BUN 30 (*)    Creatinine, Ser 1.50 (*)    All  other components within normal limits  PROTIME-INR  APTT  DIFFERENTIAL  CBG MONITORING, ED    EKG None  Radiology No results found.  Procedures Procedures (including critical care time)  Medications Ordered in ED Medications  sodium chloride flush (NS) 0.9 % injection 3 mL (has no administration in time range)    ED Course  I have reviewed the triage vital signs and the nursing notes.  Pertinent labs & imaging results that were available during my care of the patient were reviewed by me and considered in my medical decision making (see chart for details).  Clinical Course as of Apr 12 1453  Sun Apr 13, 2019  1451 Dr. Rory Percy   [BM]    Clinical Course User Index [BM] Gari Crown   MDM Rules/Calculators/A&P                     2:35 PM: Initial evaluation patient resting comfortably no acute distress.  Daughter at bedside reports that she noticed her right arm weakness and possible right leg weakness beginning at 10 AM this morning.  No other abnormalities per daughter.  She reports that prior to 10 AM patient was in his normal state of health sitting in his wheelchair.  There is no history of recent infections or falls/injuries.  Patient has history of prior CVA affecting his right extremities and daughter is concerned he may be having a recurrent stroke.  Due to patient's extreme hard of hearing and history of dementia is somewhat limited but  examination there does not appear to be any cranial nerve deficits, speech is slightly slurred but daughter reports this as the patient's baseline.  He has 4/5 strength with movements of the right upper extremity compared to left and movements of the bilateral lower extremities appear equal.  He has no pronator drift on exam, no neglect or aphasia.  Vision appears intact bilaterally.  Case discussed with Dr. Johnney Killian, patient is outside of window at this time will consult neurology. - Initial lab work has resulted and is  unremarkable.  No leukocytosis or significant electrolyte abnormalities.  Creatinine appears baseline.  CT head is still pending at this time.  I have added chest x-ray and urinalysis to work-up to evaluate for infection which may be exacerbating old stroke symptoms.  2:51 PM: I discussed the case with neurologist Dr. Rory Percy who recommends MRI brain, call neurology back if positive.  No activation indicated at this time. - 3 PM: Patient reassessed resting comfortably no acute distress sitting up in bed interactive with his daughter. - Care handoff given to [facility] Albrizze PA-C at shift change.  Plan of care at this time is to follow-up on pending tests, reassess, disposition per oncoming team.  Note: Portions of this report may have been transcribed using voice recognition software. Every effort was made to ensure accuracy; however, inadvertent computerized transcription errors may still be present. Final Clinical Impression(s) / ED Diagnoses Final diagnoses:  None    Rx / DC Orders ED Discharge Orders    None       Gari Crown 04/13/19 1506    Charlesetta Shanks, MD 04/21/19 1457

## 2019-04-13 NOTE — ED Triage Notes (Signed)
Pt has history of strokes.  Family reports pt not using R arm this morning.  No arm drift at present.  Pt denies pain.

## 2019-04-13 NOTE — Plan of Care (Signed)
Called for transient rt sided weakness. Pt with significant dementia and strokes history, comes with transient right-sided weakness. Due to history of strokes, recommended getting an MRI.  MRI shows a right cortical punctate infarct. Symptoms not correlated with that. Of note, he has a left-sided meningioma which is chronic. Given the transient left-sided weakness, suspect seizures from that side. Given his advanced dementia, frail health, would not admit him at this time and would recommend the following because he has reasonable outpatient follow-up: -Start Keppra 500 twice daily -Follow-up with [facility] neurology in the next 2 weeks Plan discussed with the calling ED provider.  -- Amie Portland, MD [facility] Pager: [phone] If 7pm to 7am, please call on call as listed on AMION.

## 2019-04-13 NOTE — ED Provider Notes (Signed)
Care assumed from B. Morelli PA-C at shift change pending CT head, MRI, UA, chest xray.  See his note for full H&P.   Briefly this is a 86 year old male presenting with right arm and leg weakness since 10 AM this morning.  He is accompanied by his full-time caretaker.  Per previous provider patient has been negative, right upper extremity strength is 4 out of 5.  On-call neurologist Dr. Rory Percy was consulted and recommends MRI brain for further evaluation.    Physical Exam  BP (!) 138/92   Pulse 69   Temp 98.2 F (36.8 C) (Oral)   Resp 18   SpO2 95%   Physical Exam PE: Constitutional: well-developed, well-nourished, no apparent distress HENT: normocephalic, atraumatic. no cervical adenopathy Cardiovascular: normal rate and rhythm, distal pulses intact Pulmonary/Chest: effort normal; breath sounds clear and equal bilaterally; no wheezes or rales Abdominal: soft and nontender Musculoskeletal: full ROM, no edema Neurological: alert.  Follows simple commands.  Right upper extremity with 4/5 grip strength compared to the left upper extremity with 5/5 strength.  5/5 strength in bilateral lower extremities. Skin: warm and dry, no rash, no diaphoresis Psychiatric: normal mood and affect, normal behavior   ED Course/Procedures    Results for orders placed or performed during the hospital encounter of 04/13/19 (from the past 24 hour(s))  Protime-INR     Status: None   Collection Time: 04/13/19  1:23 PM  Result Value Ref Range   Prothrombin Time 13.0 11.4 - 15.2 seconds   INR 1.0 0.8 - 1.2  APTT     Status: None   Collection Time: 04/13/19  1:23 PM  Result Value Ref Range   aPTT 31 24 - 36 seconds  CBC     Status: Abnormal   Collection Time: 04/13/19  1:23 PM  Result Value Ref Range   WBC 6.9 4.0 - 10.5 K/uL   RBC 4.49 4.22 - 5.81 MIL/uL   Hemoglobin 13.4 13.0 - 17.0 g/dL   HCT 43.2 39.0 - 52.0 %   MCV 96.2 80.0 - 100.0 fL   MCH 29.8 26.0 - 34.0 pg   MCHC 31.0 30.0 - 36.0 g/dL    RDW 15.1 11.5 - 15.5 %   Platelets 136 (L) 150 - 400 K/uL   nRBC 0.0 0.0 - 0.2 %  Differential     Status: None   Collection Time: 04/13/19  1:23 PM  Result Value Ref Range   Neutrophils Relative % 67 %   Neutro Abs 4.6 1.7 - 7.7 K/uL   Lymphocytes Relative 21 %   Lymphs Abs 1.4 0.7 - 4.0 K/uL   Monocytes Relative 8 %   Monocytes Absolute 0.6 0.1 - 1.0 K/uL   Eosinophils Relative 4 %   Eosinophils Absolute 0.3 0.0 - 0.5 K/uL   Basophils Relative 0 %   Basophils Absolute 0.0 0.0 - 0.1 K/uL   Immature Granulocytes 0 %   Abs Immature Granulocytes 0.03 0.00 - 0.07 K/uL  Comprehensive metabolic panel     Status: Abnormal   Collection Time: 04/13/19  1:23 PM  Result Value Ref Range   Sodium 144 135 - 145 mmol/L   Potassium 4.0 3.5 - 5.1 mmol/L   Chloride 108 98 - 111 mmol/L   CO2 28 22 - 32 mmol/L   Glucose, Bld 105 (H) 70 - 99 mg/dL   BUN 27 (H) 8 - 23 mg/dL   Creatinine, Ser 1.47 (H) 0.61 - 1.24 mg/dL   Calcium 10.1 8.9 - 10.3  mg/dL   Total Protein 7.3 6.5 - 8.1 g/dL   Albumin 3.4 (L) 3.5 - 5.0 g/dL   AST 19 15 - 41 U/L   ALT 12 0 - 44 U/L   Alkaline Phosphatase 61 38 - 126 U/L   Total Bilirubin 1.0 0.3 - 1.2 mg/dL   GFR calc non Af Amer 41 (L) >60 mL/min   GFR calc Af Amer 47 (L) >60 mL/min   Anion gap 8 5 - 15  CBG monitoring, ED     Status: None   Collection Time: 04/13/19  1:33 PM  Result Value Ref Range   Glucose-Capillary 89 70 - 99 mg/dL  I-stat chem 8, ED     Status: Abnormal   Collection Time: 04/13/19  1:38 PM  Result Value Ref Range   Sodium 145 135 - 145 mmol/L   Potassium 4.0 3.5 - 5.1 mmol/L   Chloride 108 98 - 111 mmol/L   BUN 30 (H) 8 - 23 mg/dL   Creatinine, Ser 1.50 (H) 0.61 - 1.24 mg/dL   Glucose, Bld 98 70 - 99 mg/dL   Calcium, Ion 1.26 1.15 - 1.40 mmol/L   TCO2 29 22 - 32 mmol/L   Hemoglobin 13.9 13.0 - 17.0 g/dL   HCT 41.0 39.0 - 52.0 %  Urinalysis, Routine w reflex microscopic     Status: Abnormal   Collection Time: 04/13/19  3:11 PM  Result  Value Ref Range   Color, Urine COLORLESS (A) YELLOW   APPearance CLEAR CLEAR   Specific Gravity, Urine 1.006 1.005 - 1.030   pH 7.0 5.0 - 8.0   Glucose, UA NEGATIVE NEGATIVE mg/dL   Hgb urine dipstick NEGATIVE NEGATIVE   Bilirubin Urine NEGATIVE NEGATIVE   Ketones, ur NEGATIVE NEGATIVE mg/dL   Protein, ur NEGATIVE NEGATIVE mg/dL   Nitrite NEGATIVE NEGATIVE   Leukocytes,Ua TRACE (A) NEGATIVE   RBC / HPF 0-5 0 - 5 RBC/hpf   WBC, UA 0-5 0 - 5 WBC/hpf   Bacteria, UA NONE SEEN NONE SEEN   Squamous Epithelial / LPF 0-5 0 - 5   Mucus PRESENT    CT HEAD WITHOUT CONTRAST  TECHNIQUE: Contiguous axial images were obtained from the base of the skull through the vertex without intravenous contrast.  COMPARISON: CT scan November 02, 2018  FINDINGS: Brain: No subdural, epidural, or subarachnoid hemorrhage. There is a large infarct in the right cerebellar hemisphere which is stable with encephalomalacia. A small lacunar infarct is seen in the left cerebellar hemisphere, also stable. Brainstem and basal cisterns are unchanged unremarkable. Ventricles and sulci are prominent but stable. Severe white matter changes are unchanged. No new ischemia or infarct is seen on the left. An infarct in the right occipital lobe is stable. An infarct in the right frontal lobe on axial image 24 was not present in August of 2020. There is no sulcal effacement in this region to definitely suggest acute edema. No other new infarcts are seen on the right. No mass effect or midline shift. The left-sided meningioma off the left sphenoid wing is again identified and stable.  Vascular: No hyperdense vessel or unexpected calcification.  Skull: Normal. Negative for fracture or focal lesion.  Sinuses/Orbits: No acute finding.  Other: The right orbital lens is not well seen on this study. Has the patient had surgery in the interval. No other acute abnormalities.  IMPRESSION: 1. There is an infarct in the right  frontal lobe which was not present in August of 2020 but is  otherwise age indeterminate. No definite sulcal effacement to suggested is definitely acute. Recommend clinical correlation. MRI could further assess acuity if clinically warranted. 2. Multiple infarcts and severe white matter changes. 3. Left-sided meningioma. 4. The right orbital lens is not identified on this study. Has the patient had cataract surgery in the interval?   Electronically Signed By: Dorise Bullion III M.D On: 04/13/2019 14:11  MRI HEAD WITHOUT CONTRAST  TECHNIQUE: Multiplanar, multiecho pulse sequences of the brain and surrounding structures were obtained without intravenous contrast.  COMPARISON: Head CT earlier today. Brain MRI 11/02/2018.  FINDINGS: Brain: Area of cortical encephalomalacia in the right middle frontal gyrus by CT today is chronic encephalomalacia (series 6, image 42) and represents interval cortical infarct since 11/02/2018. There is associated hemosiderin which is new (series 12, image 45).  The only area of restricted diffusion today is in the lateral right parietal lobe on series 5, image 83. T2 and FLAIR hyperintensity with no associated hemorrhage or mass effect.  Severe chronic ischemia in the cerebellum (greater on the right), brainstem, bilateral cerebral white matter. Numerous chronic microhemorrhages, with increased microhemorrhage in both occipital poles (series 12, image 24) and the left parietal lobe white matter (image 40) since August.  Chronic left anterior clinoid process region meningioma is stable with mild regional mass effect and probable chronic left inferior frontal gyrus edema (series 9, image 12). Stable ventricle size and configuration. No new intracranial mass effect. No acute intracranial hemorrhage identified. Negative pituitary and cervicomedullary junction.  Vascular: Major intracranial vascular flow voids are stable since August and  preserved.  Skull and upper cervical spine: Stable visible cervical spine with interbody ankylosis at several levels. Visualized bone marrow signal is within normal limits.  Sinuses/Orbits: Postoperative changes to the right globe since August. Chronic left lamina papyracea fracture.  Other: Mastoids remain clear. Visible internal auditory structures appear normal.  IMPRESSION: 1. Tiny acute cortical infarct in the lateral right parietal lobe. No associated acute hemorrhage or mass effect.  2. No other acute intracranial abnormality. Chronic but new from the August MRI right middle frontal gyrus hemorrhagic cortical infarct. Interval micro-hemorrhages also in both occipital poles and the left parietal lobe.  3. Underlying severe chronic ischemic disease and extensive chronic microhemorrhage.  4. Stable chronic left anterior clinoid process meningioma with mild mass effect and frontal lobe edema.   Electronically Signed By: Genevie Ann M.D. On: 04/13/2019 16:35  PORTABLE CHEST 1 VIEW  COMPARISON: October 17, 2018  FINDINGS: The heart size and mediastinal contours are within normal limits. Both lungs are clear. The visualized skeletal structures are unremarkable.  IMPRESSION: No active disease.   Electronically Signed By: Dorise Bullion III M.D On: 04/13/2019 15:00   EKG Interpretation  Date/Time:  Sunday April 13 2019 14:34:42 EST Ventricular Rate:  75 PR Interval:    QRS Duration: 122 QT Interval:  416 QTC Calculation: 465 R Axis:   -30 Text Interpretation: Sinus rhythm Nonspecific intraventricular conduction delay Minimal ST elevation, anterior leads Confirmed by Madalyn Rob [id number]) on 04/13/2019 7:04:10 PM        MDM   3:05 PM Patient received in sign out pending CT head results, MRI, chest xray, and UA. Please see previous provider's note including MDM up to this point.  Patient CT scan has been resulted but did not crossover into the chart.  It  appears he has an infarct in the right frontal lobe that was not seen in August 2020, however is age-indeterminate.  MRI warranted.  Work-up completed by previous provider includes a negative urinalysis.  CMP without severe electrolyte derangement, slightly elevated BUN/creatinine is consistent with his baseline.  PTT and INR normal.  CBC with thrombocytopenia, has history of the same. Differential unremarkable.  6:08 PM MRI results discussed with on-call neurologist Dr. Rory Percy.  He extensively viewed the imaging as well as patient's history.  The finding on the MRI is an acute cortical infarct in the lateral right parietal lobe without midline shift or mass-effect.  Per neurology this is an incidental finding.  Given he has symptoms on the right side he would expect to see stroke on the left side of the brain. Neurology also recommends starting patient on Keppra 500 mg twice daily with outpatient [facility] neurology follow-up within 2 weeks.  Patient does not need a loading dose of Keppra here.  His presentation could have been a seizure. Family updated on plan of care.  They have no further questions at this time and are agreeable.  Prescription sent to pharmacy for [facility].  The patient appears reasonably screened and/or stabilized for discharge and I doubt any other medical condition or other [facility] requiring further screening, evaluation, or treatment in the ED at this time prior to discharge. The patient is safe for discharge with strict return precautions discussed.  Family appears reliableto arrange for outpatient neurology follow-up. The patient was discussed with and seen by Dr. Roslynn Amble who agrees with the treatment plan.     Portions of this note were generated with [employer]. Dictation errors may occur despite best attempts at proofreading.      Flint Melter 04/13/19 1904    Lucrezia Starch, MD 04/13/19 [phone]

## 2019-04-13 NOTE — Discharge Instructions (Addendum)
You have been seen today for right sided weakness. Please read and follow all provided instructions. Return to the emergency room for worsening condition or new concerning symptoms.    1. Medications:  Prescription to the pharmacy for [place].  This is a medication used to treat seizures.  Please take as prescribed.  Continue usual home medications Take medications as prescribed. Please review all of the medicines and only take them if you do not have an allergy to them.   2. Treatment: rest, drink plenty of fluids  3. Follow Up:  Please follow up with [facility] neurology within 2 weeks.  Call the office tomorrow to schedule an appointment.   It is also a possibility that you have an allergic reaction to any of the medicines that you have been prescribed - Everybody reacts differently to medications and while MOST people have no trouble with most medicines, you may have a reaction such as nausea, vomiting, rash, swelling, shortness of breath. If this is the case, please stop taking the medicine immediately and contact your physician.  ?

## 2019-04-13 NOTE — ED Notes (Signed)
Patient verbalizes understanding of discharge instructions. Opportunity for questioning and answers were provided. pt discharged from ED with caregiver.

## 2019-04-15 DIAGNOSIS — I5032 Chronic diastolic (congestive) heart failure: Secondary | ICD-10-CM | POA: Diagnosis not present

## 2019-04-16 ENCOUNTER — Institutional Professional Consult (permissible substitution): Payer: Medicare Other | Admitting: Neurology

## 2019-04-17 ENCOUNTER — Ambulatory Visit (INDEPENDENT_AMBULATORY_CARE_PROVIDER_SITE_OTHER): Payer: Medicare Other | Admitting: Neurology

## 2019-04-17 ENCOUNTER — Encounter: Payer: Self-pay | Admitting: Neurology

## 2019-04-17 ENCOUNTER — Other Ambulatory Visit: Payer: Self-pay

## 2019-04-17 VITALS — BP 118/68 | HR 78 | Temp 97.3°F

## 2019-04-17 DIAGNOSIS — G40209 Localization-related (focal) (partial) symptomatic epilepsy and epileptic syndromes with complex partial seizures, not intractable, without status epilepticus: Secondary | ICD-10-CM

## 2019-04-17 DIAGNOSIS — I639 Cerebral infarction, unspecified: Secondary | ICD-10-CM

## 2019-04-17 DIAGNOSIS — R4189 Other symptoms and signs involving cognitive functions and awareness: Secondary | ICD-10-CM

## 2019-04-17 HISTORY — DX: Cerebral infarction, unspecified: I63.9

## 2019-04-17 MED ORDER — LEVETIRACETAM 500 MG PO TABS: 500.0000 mg | ORAL_TABLET | Freq: Two times a day (BID) | ORAL | 4 refills | Status: DC

## 2019-04-17 NOTE — Progress Notes (Signed)
PATIENT: [name] DOB: 10-18-1924  Chief Complaint  Patient presents with  . Weakness/Hx of CVA    He is here with his daughter, [name]. Reports having CVA on 11/02/2018. He went back to the ED on 03/13/19 due to right arm weakness. He was worked up for another stroke episode. He underwent an abnormal brain MRI and they would like to review those results today to get a better understanding of his condition.      HISTORICAL  [name] is a 86 years old man,  seen in request by his primary care physician Dr. Heber Waldron for evaluation of abnormal MRI of the brain, seizure, he is accompanied by his daughter [name] at today's visit on April 17, 2019.  I have reviewed and summarized the referring note from the referring physician.  He had past medical history of dementia, hypertension, hyperlipidemia, large right cerebellum stroke in 2013.  Left frontal meningioma, DVT, status post IVC filter, prior diverticular bleeding, hematuria, chronic kidney disease,  His daughter has lived with him since he suffered stroke in 2013, he presented with confusion, right side difficulty at that time,  At baseline, his daughter and him have a good routine every day, he was able to wash himself with some help,  He was taken to the hospital on November 04, 2018, he was noted to have sudden onset right arm and leg weakness during their daily morning routine, was taken to the hospital, I personally reviewed MRI of the brain small acute infarction along the right posterior frontal and right occipital cortex, severe chronic ischemic injury with multiple remote microhemorrhage 2.7 cm meningioma at the left anterior frontal lobe with chronic vasogenic edema, remote bilateral lateral cerebellar infarction extensive on the right, MRA of the brain and neck showed no large vessel disease  His right side difficulty last for few hours, gradually resolved, he was started on dual antiplatelet therapy aspirin  and Plavix for 3 weeks, then aspirin 81 mg daily  He had similar recurrent sudden onset right arm weakness, increased language difficulty, confusion on April 13, 2019, lasting for few hours, there was no loss of consciousness, no clinical seizure activity noted,  Repeat MRI of the brain on April 13, 2019 showed tiny acute cortical infarction in the lateral right parietal lobe, no other acute abnormality, chronic changes as detailed above  He was seen by hospitalist Dr.Arora, consider possible partial seizure, was started on Keppra 500 mg twice a day, he was tolerating it well  Laboratory evaluations in January 2021, creatinine 1.5, hemoglobin of 13.9, REVIEW OF SYSTEMS: Full 14 system review of systems performed and notable only for as above All other review of systems were negative.  ALLERGIES: No Known Allergies  HOME MEDICATIONS: Current Outpatient Medications  Medication Sig Dispense Refill  . amLODipine (NORVASC) 5 MG tablet Take 1 tablet (5 mg total) by mouth daily. 90 tablet 3  . aspirin EC 81 MG EC tablet Take 1 tablet (81 mg total) by mouth daily. 30 tablet 1  . atorvastatin (LIPITOR) 20 MG tablet Take 1 tablet (20 mg total) by mouth daily. (Patient taking differently: Take 20 mg by mouth every evening. ) 90 tablet 3  . furosemide (LASIX) 40 MG tablet Take 2 tablets (80 mg total) by mouth daily. 90 tablet 1  . levETIRAcetam (KEPPRA) 500 MG tablet Take 1 tablet (500 mg total) by mouth 2 (two) times daily. 60 tablet 0  . triamcinolone ointment (KENALOG) 0.1 % Apply 1 application  topically 2 (two) times daily. (Patient taking differently: Apply 1 application topically daily as needed (For leg rash). ) 80 g 1   No current facility-administered medications for this visit.    PAST MEDICAL HISTORY: Past Medical History:  Diagnosis Date  . Anemia   . BPH (benign prostatic hypertrophy)    TURP 05/19/13  . Cerebral embolism with cerebral infarction ([place]) 12/19/2013  . Chronic  kidney disease    CHRONIC KIDNEY DISEASE, 2  . Difficulty hearing, right    BILATERAL HEARING LOSS - BEST TO TRY TO SPEAK INTO LEFT EAR  . DVT (deep venous thrombosis) ([place])   . Frequent falls   . History of DVT (deep vein thrombosis) 06/10/2013   Provoked s/p TURP. Dx per doppler 06/10/13. Complicated by hematuria while on anticoagulation s/p TURP. Initially on lovenox and coumadin, then stopped, then IVC filter placed 06/16/13. Anticoagulation restarted after hematuria stopped. Lovenox was discontinued apparently on 07/29/13 with comment on high risk for falls.   Total ~1.5 months of anticoagulation but interrupted with bleeding complication.  . Hyperlipidemia   . Hypertension   . Incontinence of urine    SOME INCONTINENCE  . Left adrenal mass ([place]) 09/11/2013  . Meningioma ([place])   . Stroke ([place])    Cerebellar, 2013; WALKS WITH WALKER, ABLE TO DRESS AND BATHE HIMSELF BUT FAMILY TRIES TO PROVIDE SUPERVISION BECAUSE OF HIS HX OF FALL AND WEAKNESS LEGS, ARMS   . Thrombocytopenia ([place])   . Vitreous hemorrhage ([place]) 12/21/2013   OS 12/18/13 CT /MRI brain done for ataxia     PAST SURGICAL HISTORY: Past Surgical History:  Procedure Laterality Date  . CYSTOSCOPY N/A 06/13/2013   Procedure: CYSTOSCOPY FLEXIBLE BEDSIDE;  Surgeon: Ardis Hughs, MD;  Location: [facility];  Service: Urology;  Laterality: N/A;  . MYRINGOTOMY WITH TUBE PLACEMENT Bilateral   . TRANSURETHRAL RESECTION OF PROSTATE N/A 05/19/2013   Procedure: TRANSURETHRAL RESECTION OF THE PROSTATE WITH GYRUS INSTRUMENTS;  Surgeon: Ailene Rud, MD;  Location: [facility];  Service: Urology;  Laterality: N/A;    FAMILY HISTORY: Family History  Problem Relation Age of Onset  . Diabetes Mother   . Heart disease Mother   . Hypertension Mother   . Heart disease Father   . Hypertension Father   . Hypertension Daughter     SOCIAL HISTORY: Social History   Socioeconomic History  . Marital status: Widowed    Spouse name: Not on file    . Number of children: Not on file  . Years of education: Not on file  . Highest education level: Not on file  Occupational History  . Not on file  Tobacco Use  . Smoking status: Never Smoker  . Smokeless tobacco: Never Used  Substance and Sexual Activity  . Alcohol use: No    Alcohol/week: 0.0 standard drinks  . Drug use: No  . Sexual activity: Never  Other Topics Concern  . Not on file  Social History Narrative   Lives with daughter. Ambulates w walker. Can make basic meals (cereal). Very hard of hearing.    Social Determinants of Health   Financial Resource Strain:   . Difficulty of Paying Living Expenses: Not on file  Food Insecurity:   . Worried About Charity fundraiser in the Last Year: Not on file  . Ran Out of Food in the Last Year: Not on file  Transportation Needs:   . Lack of Transportation (Medical): Not on file  . Lack of Transportation (Non-Medical):  Not on file  Physical Activity:   . Days of Exercise per Week: Not on file  . Minutes of Exercise per Session: Not on file  Stress:   . Feeling of Stress : Not on file  Social Connections:   . Frequency of Communication with Friends and Family: Not on file  . Frequency of Social Gatherings with Friends and Family: Not on file  . Attends Religious Services: Not on file  . Active Member of Clubs or Organizations: Not on file  . Attends Archivist Meetings: Not on file  . Marital Status: Not on file  Intimate Partner Violence:   . Fear of Current or Ex-Partner: Not on file  . Emotionally Abused: Not on file  . Physically Abused: Not on file  . Sexually Abused: Not on file     PHYSICAL EXAM   Vitals:   04/17/19 1558  Height: 5\' 9"  (1.753 m)    Not recorded      Body mass index is 30.86 kg/m.  PHYSICAL EXAMNIATION:  Gen: NAD, conversant, well nourised, well groomed                     Cardiovascular: Regular rate rhythm, no peripheral edema, warm, nontender. Eyes: Conjunctivae clear  without exudates or hemorrhage Neck: Supple, no carotid bruits. Pulmonary: Clear to auscultation bilaterally   NEUROLOGICAL EXAM:  MENTAL STATUS: Speech/cognition:    He has slow spastic slurred speech, aphasia, comprehensive expressive aphasia, following simple command   CRANIAL NERVES: CN II: Visual fields are full to confrontation. Pupils are round equal and briskly reactive to light. CN III, IV, VI: extraocular movement are normal. No ptosis. CN V: Facial sensation is intact to light touch CN VII: Face is symmetric with normal eye closure  CN VIII: Hearing is normal to causal conversation. CN IX, X: Phonation is normal. CN XI: Head turning and shoulder shrug are intact  MOTOR: He has mild fixation of right upper extremity upon rapid rotating movement, bilateral hand intrinsic muscle atrophy, moving bilateral upper and lower extremity against gravity, mild bilateral hands grip weakness. REFLEXES: Reflexes are 1 and symmetric at the biceps, triceps, knees, and ankles. Plantar responses are flexor.  SENSORY: Withdrawal to pain  COORDINATION: Mild bilateral finger-to-nose dysmetria  GAIT/STANCE: Deferred  DIAGNOSTIC DATA (LABS, IMAGING, TESTING) - I reviewed patient records, labs, notes, testing and imaging myself where available.   ASSESSMENT AND PLAN  COBEY CABALLEROS is a 86 y.o. male   History of stroke, Left frontal meningioma  Recurrent spells of sudden onset right-sided weakness,  Is consistent with probable partial seizure  Keep Keppra 500 mg twice a day  EEG  Gait abnormality  Multiple stroke in the past,  Home physical therapy  Marcial Pacas, M.D. Ph.D.  [facility] [address], [place], [place] [address] Ph: [phone] Fax: [phone]  CC: Referring Provider

## 2019-04-23 ENCOUNTER — Telehealth: Payer: Self-pay | Admitting: Internal Medicine

## 2019-04-23 NOTE — Telephone Encounter (Signed)
Is the 2/17 date to start [facility] PT ok with you dr Heber [place]?

## 2019-04-23 NOTE — Telephone Encounter (Signed)
2/17 is fine

## 2019-04-23 NOTE — Telephone Encounter (Signed)
Lm on restricted vmail , ok from dr Heber [place]

## 2019-04-23 NOTE — Telephone Encounter (Signed)
Rec'd Call from [facility] they can take this pt for PT services as requested but, [place] date will be 04/30/2019.  Please call back to verify if this [facility] will be okay.

## 2019-04-28 DIAGNOSIS — I5032 Chronic diastolic (congestive) heart failure: Secondary | ICD-10-CM | POA: Diagnosis not present

## 2019-04-28 DIAGNOSIS — H9193 Unspecified hearing loss, bilateral: Secondary | ICD-10-CM | POA: Diagnosis not present

## 2019-04-28 DIAGNOSIS — D696 Thrombocytopenia, unspecified: Secondary | ICD-10-CM | POA: Diagnosis not present

## 2019-04-28 DIAGNOSIS — Z7982 Long term (current) use of aspirin: Secondary | ICD-10-CM | POA: Diagnosis not present

## 2019-04-28 DIAGNOSIS — I69393 Ataxia following cerebral infarction: Secondary | ICD-10-CM | POA: Diagnosis not present

## 2019-04-28 DIAGNOSIS — Z95828 Presence of other vascular implants and grafts: Secondary | ICD-10-CM | POA: Diagnosis not present

## 2019-04-28 DIAGNOSIS — N183 Chronic kidney disease, stage 3 unspecified: Secondary | ICD-10-CM | POA: Diagnosis not present

## 2019-04-28 DIAGNOSIS — D500 Iron deficiency anemia secondary to blood loss (chronic): Secondary | ICD-10-CM | POA: Diagnosis not present

## 2019-04-28 DIAGNOSIS — D329 Benign neoplasm of meninges, unspecified: Secondary | ICD-10-CM | POA: Diagnosis not present

## 2019-04-28 DIAGNOSIS — E785 Hyperlipidemia, unspecified: Secondary | ICD-10-CM | POA: Diagnosis not present

## 2019-04-28 DIAGNOSIS — I130 Hypertensive heart and chronic kidney disease with heart failure and stage 1 through stage 4 chronic kidney disease, or unspecified chronic kidney disease: Secondary | ICD-10-CM | POA: Diagnosis not present

## 2019-04-28 DIAGNOSIS — Z9181 History of falling: Secondary | ICD-10-CM | POA: Diagnosis not present

## 2019-04-28 DIAGNOSIS — I69351 Hemiplegia and hemiparesis following cerebral infarction affecting right dominant side: Secondary | ICD-10-CM | POA: Diagnosis not present

## 2019-04-29 ENCOUNTER — Telehealth: Payer: Self-pay | Admitting: Neurology

## 2019-04-29 NOTE — Telephone Encounter (Signed)
[name]  With [facility] called to request [facility]  home health PT on hand 2 times a week for 6 weeks  1 time a week for 3 weeks

## 2019-04-29 NOTE — Telephone Encounter (Signed)
I spoke to Seibert at [facility]. They will initiate physical therapy as outlined below.

## 2019-05-02 DIAGNOSIS — I69393 Ataxia following cerebral infarction: Secondary | ICD-10-CM | POA: Diagnosis not present

## 2019-05-02 DIAGNOSIS — Z9181 History of falling: Secondary | ICD-10-CM | POA: Diagnosis not present

## 2019-05-02 DIAGNOSIS — I5032 Chronic diastolic (congestive) heart failure: Secondary | ICD-10-CM | POA: Diagnosis not present

## 2019-05-02 DIAGNOSIS — D329 Benign neoplasm of meninges, unspecified: Secondary | ICD-10-CM | POA: Diagnosis not present

## 2019-05-02 DIAGNOSIS — E785 Hyperlipidemia, unspecified: Secondary | ICD-10-CM | POA: Diagnosis not present

## 2019-05-02 DIAGNOSIS — D500 Iron deficiency anemia secondary to blood loss (chronic): Secondary | ICD-10-CM | POA: Diagnosis not present

## 2019-05-02 DIAGNOSIS — I130 Hypertensive heart and chronic kidney disease with heart failure and stage 1 through stage 4 chronic kidney disease, or unspecified chronic kidney disease: Secondary | ICD-10-CM | POA: Diagnosis not present

## 2019-05-02 DIAGNOSIS — Z7982 Long term (current) use of aspirin: Secondary | ICD-10-CM | POA: Diagnosis not present

## 2019-05-02 DIAGNOSIS — D696 Thrombocytopenia, unspecified: Secondary | ICD-10-CM | POA: Diagnosis not present

## 2019-05-02 DIAGNOSIS — I69351 Hemiplegia and hemiparesis following cerebral infarction affecting right dominant side: Secondary | ICD-10-CM | POA: Diagnosis not present

## 2019-05-02 DIAGNOSIS — Z95828 Presence of other vascular implants and grafts: Secondary | ICD-10-CM | POA: Diagnosis not present

## 2019-05-02 DIAGNOSIS — H9193 Unspecified hearing loss, bilateral: Secondary | ICD-10-CM | POA: Diagnosis not present

## 2019-05-02 DIAGNOSIS — N183 Chronic kidney disease, stage 3 unspecified: Secondary | ICD-10-CM | POA: Diagnosis not present

## 2019-05-05 ENCOUNTER — Ambulatory Visit (INDEPENDENT_AMBULATORY_CARE_PROVIDER_SITE_OTHER): Payer: Medicare Other | Admitting: Neurology

## 2019-05-05 ENCOUNTER — Other Ambulatory Visit: Payer: Medicare Other

## 2019-05-05 ENCOUNTER — Other Ambulatory Visit: Payer: Self-pay

## 2019-05-05 DIAGNOSIS — G40909 Epilepsy, unspecified, not intractable, without status epilepticus: Secondary | ICD-10-CM

## 2019-05-05 DIAGNOSIS — G40209 Localization-related (focal) (partial) symptomatic epilepsy and epileptic syndromes with complex partial seizures, not intractable, without status epilepticus: Secondary | ICD-10-CM

## 2019-05-05 DIAGNOSIS — I639 Cerebral infarction, unspecified: Secondary | ICD-10-CM

## 2019-05-05 DIAGNOSIS — R4189 Other symptoms and signs involving cognitive functions and awareness: Secondary | ICD-10-CM

## 2019-05-07 DIAGNOSIS — D329 Benign neoplasm of meninges, unspecified: Secondary | ICD-10-CM | POA: Diagnosis not present

## 2019-05-07 DIAGNOSIS — I69393 Ataxia following cerebral infarction: Secondary | ICD-10-CM | POA: Diagnosis not present

## 2019-05-07 DIAGNOSIS — Z7982 Long term (current) use of aspirin: Secondary | ICD-10-CM | POA: Diagnosis not present

## 2019-05-07 DIAGNOSIS — N183 Chronic kidney disease, stage 3 unspecified: Secondary | ICD-10-CM | POA: Diagnosis not present

## 2019-05-07 DIAGNOSIS — D696 Thrombocytopenia, unspecified: Secondary | ICD-10-CM | POA: Diagnosis not present

## 2019-05-07 DIAGNOSIS — I130 Hypertensive heart and chronic kidney disease with heart failure and stage 1 through stage 4 chronic kidney disease, or unspecified chronic kidney disease: Secondary | ICD-10-CM | POA: Diagnosis not present

## 2019-05-07 DIAGNOSIS — I5032 Chronic diastolic (congestive) heart failure: Secondary | ICD-10-CM | POA: Diagnosis not present

## 2019-05-07 DIAGNOSIS — Z9181 History of falling: Secondary | ICD-10-CM | POA: Diagnosis not present

## 2019-05-07 DIAGNOSIS — E785 Hyperlipidemia, unspecified: Secondary | ICD-10-CM | POA: Diagnosis not present

## 2019-05-07 DIAGNOSIS — I69351 Hemiplegia and hemiparesis following cerebral infarction affecting right dominant side: Secondary | ICD-10-CM | POA: Diagnosis not present

## 2019-05-07 DIAGNOSIS — H9193 Unspecified hearing loss, bilateral: Secondary | ICD-10-CM | POA: Diagnosis not present

## 2019-05-07 DIAGNOSIS — D500 Iron deficiency anemia secondary to blood loss (chronic): Secondary | ICD-10-CM | POA: Diagnosis not present

## 2019-05-07 DIAGNOSIS — Z95828 Presence of other vascular implants and grafts: Secondary | ICD-10-CM | POA: Diagnosis not present

## 2019-05-07 NOTE — Procedures (Signed)
   HISTORY: 86 years old male, with history of stroke, seizure  TECHNIQUE:  This is a routine 16 channel EEG recording with one channel devoted to a limited EKG recording.  It was performed during wakefulness, drowsiness and asleep. Photic stimulation were performed as activating procedures.  There are minimum muscle and movement artifact noted.  Upon maximum arousal, posterior dominant waking rhythm consistent of mildly dysrhythmic theta range activity, with frequency of 6-7 hz. Activities are symmetric over the bilateral posterior derivations and attenuated with eye opening.  Photic stimulation did not alter the tracing.  During EEG recording, patient developed drowsiness and no deeper stage of sleep was achieved During EEG recording, there was no epileptiform discharge noted.  EKG demonstrate sinus rhythm, with heart rate of 72 bpm  CONCLUSION: This is a mild abnormal EEG.  There is no electrodiagnostic evidence of epileptiform discharge.  There is evidence of mild background slowing, suggestive of mild bihemispheric malfunction, common etiology are metabolic toxic.  Marcial Pacas, M.D. Ph.D.  [facility] [place], [place] [address] Phone: [phone] Fax:      [phone]

## 2019-05-08 DIAGNOSIS — I5032 Chronic diastolic (congestive) heart failure: Secondary | ICD-10-CM | POA: Diagnosis not present

## 2019-05-08 DIAGNOSIS — Z95828 Presence of other vascular implants and grafts: Secondary | ICD-10-CM | POA: Diagnosis not present

## 2019-05-08 DIAGNOSIS — H9193 Unspecified hearing loss, bilateral: Secondary | ICD-10-CM | POA: Diagnosis not present

## 2019-05-08 DIAGNOSIS — D329 Benign neoplasm of meninges, unspecified: Secondary | ICD-10-CM | POA: Diagnosis not present

## 2019-05-08 DIAGNOSIS — D696 Thrombocytopenia, unspecified: Secondary | ICD-10-CM | POA: Diagnosis not present

## 2019-05-08 DIAGNOSIS — N183 Chronic kidney disease, stage 3 unspecified: Secondary | ICD-10-CM | POA: Diagnosis not present

## 2019-05-08 DIAGNOSIS — I69351 Hemiplegia and hemiparesis following cerebral infarction affecting right dominant side: Secondary | ICD-10-CM | POA: Diagnosis not present

## 2019-05-08 DIAGNOSIS — E785 Hyperlipidemia, unspecified: Secondary | ICD-10-CM | POA: Diagnosis not present

## 2019-05-08 DIAGNOSIS — Z9181 History of falling: Secondary | ICD-10-CM | POA: Diagnosis not present

## 2019-05-08 DIAGNOSIS — I69393 Ataxia following cerebral infarction: Secondary | ICD-10-CM | POA: Diagnosis not present

## 2019-05-08 DIAGNOSIS — D500 Iron deficiency anemia secondary to blood loss (chronic): Secondary | ICD-10-CM | POA: Diagnosis not present

## 2019-05-08 DIAGNOSIS — Z7982 Long term (current) use of aspirin: Secondary | ICD-10-CM | POA: Diagnosis not present

## 2019-05-08 DIAGNOSIS — I130 Hypertensive heart and chronic kidney disease with heart failure and stage 1 through stage 4 chronic kidney disease, or unspecified chronic kidney disease: Secondary | ICD-10-CM | POA: Diagnosis not present

## 2019-05-11 ENCOUNTER — Ambulatory Visit: Payer: Medicare Other | Attending: Internal Medicine

## 2019-05-11 DIAGNOSIS — Z23 Encounter for immunization: Secondary | ICD-10-CM | POA: Insufficient documentation

## 2019-05-11 NOTE — Progress Notes (Signed)
   Covid-19 Vaccination Clinic  Name:  [name]    MRN: [id number] DOB: 03-11-1925  05/11/2019  [name] was observed post Covid-19 immunization for 15 minutes without incidence. He was provided with Vaccine Information Sheet and instruction to access the V-Safe system.   [name] was instructed to call 911 with any severe reactions post vaccine: Marland Kitchen Difficulty breathing  . Swelling of your face and throat  . A fast heartbeat  . A bad rash all over your body  . Dizziness and weakness    Immunizations Administered    Name Date Dose VIS Date Route   Pfizer COVID-19 Vaccine 05/11/2019 12:09 PM 0.3 mL 02/21/2019 Intramuscular   Manufacturer: [place]   Lot: [id number]   [place]: [id number]

## 2019-06-10 ENCOUNTER — Ambulatory Visit: Payer: Medicare Other | Attending: Internal Medicine

## 2019-06-10 DIAGNOSIS — Z23 Encounter for immunization: Secondary | ICD-10-CM

## 2019-06-10 NOTE — Progress Notes (Signed)
   Covid-19 Vaccination Clinic  Name:  [name]    MRN: [id number] DOB: 1924-08-23  06/10/2019  Mr. Blee was observed post Covid-19 immunization for 15 minutes without incident. He was provided with Vaccine Information Sheet and instruction to access the V-Safe system.   Mr. Gogue was instructed to call 911 with any severe reactions post vaccine: Marland Kitchen Difficulty breathing  . Swelling of face and throat  . A fast heartbeat  . A bad rash all over body  . Dizziness and weakness   Immunizations Administered    Name Date Dose VIS Date Route   Pfizer COVID-19 Vaccine 06/10/2019  2:15 PM 0.3 mL 02/21/2019 Intramuscular   Manufacturer: [place]   Lot: [id number]   [place]: [id number]

## 2019-06-19 ENCOUNTER — Other Ambulatory Visit: Payer: Self-pay

## 2019-06-19 ENCOUNTER — Ambulatory Visit (INDEPENDENT_AMBULATORY_CARE_PROVIDER_SITE_OTHER): Payer: Medicare Other | Admitting: Adult Health

## 2019-06-19 ENCOUNTER — Encounter: Payer: Self-pay | Admitting: Adult Health

## 2019-06-19 VITALS — BP 127/74 | HR 73 | Temp 98.1°F | Ht 67.0 in | Wt 209.0 lb

## 2019-06-19 DIAGNOSIS — R569 Unspecified convulsions: Secondary | ICD-10-CM

## 2019-06-19 DIAGNOSIS — Z8673 Personal history of transient ischemic attack (TIA), and cerebral infarction without residual deficits: Secondary | ICD-10-CM | POA: Diagnosis not present

## 2019-06-19 DIAGNOSIS — E785 Hyperlipidemia, unspecified: Secondary | ICD-10-CM | POA: Diagnosis not present

## 2019-06-19 DIAGNOSIS — I10 Essential (primary) hypertension: Secondary | ICD-10-CM

## 2019-06-19 DIAGNOSIS — I69398 Other sequelae of cerebral infarction: Secondary | ICD-10-CM

## 2019-06-19 MED ORDER — LEVETIRACETAM ER 500 MG PO TB24: 1000.0000 mg | ORAL_TABLET | Freq: Every day | ORAL | 3 refills | Status: DC

## 2019-06-19 NOTE — Patient Instructions (Signed)
Due to possible day time fatigue, recommend changing keppra to 1000mg  XR (2 tabs) at night time  Continue to work with therapies for ongoing improving   Continue aspirin 81 mg daily  and atorvastatin for secondary stroke prevention  Continue to follow up with PCP regarding cholesterol and blood pressure management   Maintain strict control of hypertension with blood pressure goal below 130/90, diabetes with hemoglobin A1c goal below 6.5% and cholesterol with LDL cholesterol (bad cholesterol) goal below 70 mg/dL. I also advised the patient to eat a healthy diet with plenty of whole grains, cereals, fruits and vegetables, exercise regularly and maintain ideal body weight.  Followup in the future with me in 6 months or call earlier if needed       Thank you for coming to see [place] at [facility]. I hope we have been able to provide you high quality care today.  You may receive a patient satisfaction survey over the next few weeks. We would appreciate your feedback and comments so that we may continue to improve ourselves and the health of our patients.

## 2019-06-19 NOTE — Progress Notes (Signed)
[name] [address] [place]. [place] [address] [phone]       STROKE  FOLLOW UP NOTE  [name] Date of Birth:  04-11-1924 Medical Record Number:  [id number]   Reason for Referral: stroke follow up    CHIEF COMPLAINT:  Chief Complaint  Patient presents with  . Follow-up    rm 8, with daughter, hx of stroke    HPI:   Mr. View is a 86 year old male who is being seen today, 06/19/2019, for stroke with residual deficit and possible seizure follow-up accompanied by.  Underlying medical history includes stroke history 2013 and 2020, left frontal meningioma, chronic DVT, HTN, HLD, and cerebral embolism 2015.  He was previously seen in office by Dr. Krista Blue on 04/17/2019 for hospital follow-up on 04/13/2019 with possible seizure activity discharged on Keppra 500 mg twice daily.  EEG on 05/05/2019 showed mild abnormality with mild background slowing but otherwise no definite epileptiform discharge.  Remains on Keppra without seizure activity but daughter does report increased fatigue since initiating.  Gait impairment has been improving with ongoing participation with [facility] therapy.  he denies new or recurring stroke/TIA symptoms.  Continues on aspirin and atorvastatin without side effects.  Blood pressure today 127/74.  Prior documentation of "dementia" but daughter denies history of cognitive impairment and only more age related memory loss. No further concerns at this time.     History copied from prior notes for reference purposes only Hospital follow-up 12/01/2018: Mr. [name] is being seen today for hospital follow-up accompanied by his daughter.  Daughter provides majority of history.  Residual deficits of minimal left-sided weakness but does endorse great improvement.  Currently ambulating with rolling walker short distance and will use wheelchair for long distance.  Currently in the process of restarting home health therapy.  He continues to reside with his  daughter which for the living arrangements prior to recent stroke.  Completed 3 weeks DAPT and continues on aspirin alone without bleeding or bruising.  Continues on atorvastatin 20 mg daily without myalgias.  Blood pressure today satisfactory at 118/80.  Denies new or worsening stroke/TIA symptoms.  Stroke admission 11/02/2018: [name] is a 86 y.o. 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   Stroke work-up revealed small acute or subacute infarcts in the right posterior frontal and right occipital cortex embolic as evidenced on MRI concerning for occult A. fib versus hypercoagulable state given recurrent DVTs.  CT head showed stable 2.7 cm calcified meningioma.  MRI also showed severe chronic ischemic injury with multiple remote microhemorrhages in hypertensive pattern and remote bilateral CVAs.  Lower extremity venous Doppler RLE chronic DVT.  2D echo normal EF without cardiac source of embolus identified however severe LA enlargement.  LDL 54.  A1c 5.5.  No antithrombotic prior to admission and recommended DAPT for 3 weeks and aspirin alone.  No further cardioembolic work-up indicated as patient not a candidate for [facility] due to advanced age, frequent falls and recent hematuria with anemia.  Recent hospital admission on 10/17/2018 with hematuria, anemia and syncope with close CBC monitoring and continuation of DAPT but not AC candidate.  HTN stable.  Continuation of atorvastatin 20 mg daily for HLD management.  Other stroke risk factors include prior stroke, chronic extensive DVT, advanced age, and obesity.  He was discharged home in stable condition with  recommendation of home health therapy.   ROS:   14 system review of systems performed and negative with exception of gait impairment     PMH:  Past Medical History:  Diagnosis Date  .  Anemia   . BPH (benign prostatic hypertrophy)    TURP 05/19/13  . Cerebral embolism with cerebral infarction ([place]) 12/19/2013  . Chronic kidney disease    CHRONIC KIDNEY DISEASE, 2  . Difficulty hearing, right    BILATERAL HEARING LOSS - BEST TO TRY TO SPEAK INTO LEFT EAR  . DVT (deep venous thrombosis) ([place])   . Frequent falls   . History of DVT (deep vein thrombosis) 06/10/2013   Provoked s/p TURP. Dx per doppler 06/10/13. Complicated by hematuria while on anticoagulation s/p TURP. Initially on lovenox and coumadin, then stopped, then IVC filter placed 06/16/13. Anticoagulation restarted after hematuria stopped. Lovenox was discontinued apparently on 07/29/13 with comment on high risk for falls.   Total ~1.5 months of anticoagulation but interrupted with bleeding complication.  . Hyperlipidemia   . Hypertension   . Incontinence of urine    SOME INCONTINENCE  . Left adrenal mass ([place]) 09/11/2013  . Meningioma ([place])   . Stroke ([place])    Cerebellar, 2013; WALKS WITH WALKER, ABLE TO DRESS AND BATHE HIMSELF BUT FAMILY TRIES TO PROVIDE SUPERVISION BECAUSE OF HIS HX OF FALL AND WEAKNESS LEGS, ARMS   . Thrombocytopenia ([place])   . Vitreous hemorrhage ([place]) 12/21/2013   OS 12/18/13 CT /MRI brain done for ataxia     PSH:  Past Surgical History:  Procedure Laterality Date  . CYSTOSCOPY N/A 06/13/2013   Procedure: CYSTOSCOPY FLEXIBLE BEDSIDE;  Surgeon: Ardis Hughs, MD;  Location: [facility];  Service: Urology;  Laterality: N/A;  . MYRINGOTOMY WITH TUBE PLACEMENT Bilateral   . TRANSURETHRAL RESECTION OF PROSTATE N/A 05/19/2013   Procedure: TRANSURETHRAL RESECTION OF THE PROSTATE WITH GYRUS INSTRUMENTS;  Surgeon: Ailene Rud, MD;  Location: [facility];  Service: Urology;  Laterality: N/A;    Social History:  Social History   Socioeconomic History  . Marital status: Widowed    Spouse name: Not on file  . Number of children: 1  . Years of education: 10th grade  . Highest education level: Not on  file  Occupational History  . Occupation: Retired  Tobacco Use  . Smoking status: Never Smoker  . Smokeless tobacco: Never Used  Substance and Sexual Activity  . Alcohol use: No    Alcohol/week: 0.0 standard drinks  . Drug use: No  . Sexual activity: Never  Other Topics Concern  . Not on file  Social History Narrative   Lives with his daughter.   Right-handed.   No daily caffeine use.   Social Determinants of Health   Financial Resource Strain:   . Difficulty of Paying Living Expenses:   Food Insecurity:   . Worried About Charity fundraiser in the Last Year:   . Arboriculturist in the Last Year:   Transportation Needs:   . Film/video editor (Medical):   Marland Kitchen Lack of Transportation (Non-Medical):   Physical Activity:   . Days of Exercise per Week:   . Minutes of Exercise per Session:   Stress:   . Feeling of Stress :   Social Connections:   . Frequency of Communication with Friends and Family:   . Frequency of Social Gatherings with Friends and Family:   . Attends Religious Services:   . Active Member of Clubs or  Organizations:   . Attends Archivist Meetings:   Marland Kitchen Marital Status:   Intimate Partner Violence:   . Fear of Current or Ex-Partner:   . Emotionally Abused:   Marland Kitchen Physically Abused:   . Sexually Abused:     Family History:  Family History  Problem Relation Age of Onset  . Diabetes Mother   . Heart disease Mother   . Hypertension Mother   . Heart disease Father   . Hypertension Father   . Hypertension Daughter     Medications:   Current Outpatient Medications on File Prior to Visit  Medication Sig Dispense Refill  . amLODipine (NORVASC) 5 MG tablet Take 1 tablet (5 mg total) by mouth daily. 90 tablet 3  . aspirin EC 81 MG EC tablet Take 1 tablet (81 mg total) by mouth daily. 30 tablet 1  . atorvastatin (LIPITOR) 20 MG tablet Take 1 tablet (20 mg total) by mouth daily. (Patient taking differently: Take 20 mg by mouth every evening. ) 90  tablet 3  . furosemide (LASIX) 40 MG tablet Take 2 tablets (80 mg total) by mouth daily. 90 tablet 1  . triamcinolone ointment (KENALOG) 0.1 % Apply 1 application topically 2 (two) times daily. (Patient taking differently: Apply 1 application topically daily as needed (For leg rash). ) 80 g 1  . levETIRAcetam (KEPPRA) 500 MG tablet Take 1 tablet (500 mg total) by mouth 2 (two) times daily. 180 tablet 4   No current facility-administered medications on file prior to visit.    Allergies:  No Known Allergies   Physical Exam  Vitals:   06/19/19 1450  BP: 127/74  Pulse: 73  Temp: 98.1 F (36.7 C)  Weight: 209 lb (94.8 kg)  Height: 5\' 7"  (1.702 m)   Body mass index is 32.73 kg/m. No exam data present   General: well developed, well nourished, very pleasant elderly African-American male, seated, in no evident distress Head: head normocephalic and atraumatic.   Neck: supple with no carotid or supraclavicular bruits Cardiovascular: regular rate and rhythm, no murmurs Musculoskeletal: no deformity Skin:  no rash/petichiae Vascular:  Normal pulses all extremities   Neurologic Exam Mental Status: Awake and fully alert. Oriented to place and time. Remote memory intact. Attention span, concentration and fund of knowledge diminished. Mood and affect appropriate.  Cranial Nerves: Pupils equal, briskly reactive to light. Extraocular movements full without nystagmus. Visual fields full to confrontation.  HOH bilaterally. Facial sensation intact. Face, tongue, palate moves normally and symmetrically.  Motor: Normal bulk and tone.  Full strength in all tested extremities.  Sensory.: intact to touch , pinprick , position and vibratory sensation.  Coordination: Rapid alternating movements normal in all extremities. Finger-to-nose and heel-to-shin performed accurately bilaterally. Gait and Station: Gait assessment deferred as RW not present at visit Reflexes: 1+ and symmetric. Toes downgoing.         ASSESSMENT: NIMIT MELNIKOV is a 86 y.o. year old male presented with sudden left lower leg weakness on 11/02/2018 with stroke work-up revealing small acute or subacute infarcts along the right posterior frontal and right occipital cortex embolic concerning for occult A. fib versus hypercoagulable state given recurrent DVTs but no further work-up recommended as patient has not an anticoagulation candidate.  Evaluated in ED on 04/13/2019 with possible seizure activity and initiated Keppra for prophylaxis. Vascular risk factors include prior stroke 2013, known left frontal meningioma, HTN, HLD, and recurrent/chronic DVTs.   PLAN:  1. History of stroke: Continue aspirin 81  mg daily  and atorvastatin for secondary stroke prevention. Maintain strict control of hypertension with blood pressure goal below 130/90, diabetes with hemoglobin A1c goal below 6.5% and cholesterol with LDL cholesterol (bad cholesterol) goal below 70 mg/dL.  I also advised the patient to eat a healthy diet with plenty of whole grains, cereals, fruits and vegetables, exercise regularly with at least 30 minutes of continuous activity daily and maintain ideal body weight.  Advised to follow-up with PCP who is in the process of placing orders for [facility] therapy 2. Focal seizure post stroke: Concerns of increased fatigue with daytime Keppra therefore recommend transitioning to XR formulation 1000 mg nightly -new order will be placed 3. HTN: Stable.  Continue to follow with PCP 4. HLD: Stable.  Continue to follow with PCP   Follow-up in 6 months or call earlier if needed   I spent 26 minutes of face-to-face and non-face-to-face time with patient and daughter.  This included previsit chart review, lab review, study review, order entry, electronic health record documentation, patient education    Frann Rider, [facility]  [facility] [address] [place], [place] [address]  Phone [phone]  Fax [phone] Note: This document was prepared with digital dictation and possible smart phrase technology. Any transcriptional errors that result from this process are unintentional.

## 2019-06-19 NOTE — Progress Notes (Signed)
I agree with the above plan 

## 2019-06-26 ENCOUNTER — Telehealth: Payer: Self-pay | Admitting: Adult Health

## 2019-06-26 NOTE — Telephone Encounter (Signed)
I gave verbal order to [facility], LMVM for her ok to proceed with PT.  She is to call back if needed.

## 2019-06-26 NOTE — Telephone Encounter (Signed)
Noted! Thank you

## 2019-06-26 NOTE — Telephone Encounter (Signed)
Rep with [facility] called to request verbal orders for PT recertification for homehealth 2 week 4, 1 week 4

## 2019-07-01 ENCOUNTER — Telehealth: Payer: Self-pay | Admitting: Internal Medicine

## 2019-07-01 NOTE — Telephone Encounter (Signed)
Pls contact Olivia Mackie at [facility] [phone]

## 2019-07-01 NOTE — Telephone Encounter (Signed)
Rtc, no answer, no vmail pick up

## 2019-07-02 NOTE — Telephone Encounter (Signed)
I also returned a call to [name], agree with appointment in [facility] Monday, I wont be here but I discussed with her trying to take an additional 40mg  of lasix in the PM to see if that helped, additionally he may need to be fitted for a compression stocking at least on the right side

## 2019-07-02 NOTE — Telephone Encounter (Signed)
RTC to [facility] nurse, Linus Orn.  She states patient had 1+ pitting edema to RLE on Monday when she was there for her assessment.  [place] nurse states pt's daughter thought pt also was "carrying fluid in his abdomen, but it was difficult for her to assess this.  [place] nurse states pt's abdomen was a little asymmetrical, but she was unsure if this was positional.  Lung sounds clear, no SOB, abdomen non-tender,  pt taking diuretics as prescribed, voiding without difficulty".   TC to patient, spoke with his daughter, [name], and she states patient does have some swelling in his right leg and she thinks his abdomen is a little swollen.  Appt offered in [facility] today, daughter declines, states she works and is requesting an appt for Monday.  Appt in [facility] at 1045 for Monday, 07/07/19 made.  Daughter informed if patient develops any pain, increased fluid retention, or SOB to call back or call 911 if severe, she verbalized understanding.

## 2019-07-02 NOTE — Telephone Encounter (Signed)
Pls contact Olivia Mackie at [facility] [phone]

## 2019-07-07 ENCOUNTER — Encounter: Payer: Self-pay | Admitting: Internal Medicine

## 2019-07-07 ENCOUNTER — Other Ambulatory Visit: Payer: Self-pay

## 2019-07-07 ENCOUNTER — Ambulatory Visit (INDEPENDENT_AMBULATORY_CARE_PROVIDER_SITE_OTHER): Payer: Medicare Other | Admitting: Internal Medicine

## 2019-07-07 VITALS — BP 144/84 | HR 71 | Temp 97.8°F | Ht 67.0 in | Wt 203.1 lb

## 2019-07-07 DIAGNOSIS — L03115 Cellulitis of right lower limb: Secondary | ICD-10-CM

## 2019-07-07 DIAGNOSIS — I872 Venous insufficiency (chronic) (peripheral): Secondary | ICD-10-CM | POA: Diagnosis not present

## 2019-07-07 DIAGNOSIS — M7989 Other specified soft tissue disorders: Secondary | ICD-10-CM

## 2019-07-07 MED ORDER — CEPHALEXIN 500 MG PO CAPS: 500.0000 mg | ORAL_CAPSULE | Freq: Four times a day (QID) | ORAL | 0 refills | Status: AC

## 2019-07-07 NOTE — Patient Instructions (Addendum)
[name],  It was nice meeting you today! I believe your right leg swelling is due to a overlying skin infection (called cellulitis) and weakness in the veins of the leg (called venous insufficiency). Please take Keflex (an antibiotic) four times a day for five days. Additionally, begin compression on the right leg with a stocking that can get fitted at a medical supply store.  Please follow-up in our clinic on Friday, so I can reevaluate the leg after treatment with antibiotics.  Thanks for letting me be a part of your care!      Chronic Venous Insufficiency Chronic venous insufficiency is a condition where the leg veins cannot effectively pump blood from the legs to the heart. This happens when the vein walls are either stretched, weakened, or damaged, or when the valves inside the vein are damaged. With the right treatment, you should be able to continue with an active life. This condition is also called venous stasis. What are the causes? Common causes of this condition include:  High blood pressure inside the veins (venous hypertension).  Sitting or standing too long, causing increased blood pressure in the leg veins.  A blood clot that blocks blood flow in a vein (deep vein thrombosis, DVT).  Inflammation of a vein (phlebitis) that causes a blood clot to form.  Tumors in the pelvis that cause blood to back up. What increases the risk? The following factors may make you more likely to develop this condition:  Having a family history of this condition.  Obesity.  Pregnancy.  Living without enough regular physical activity or exercise (sedentary lifestyle).  Smoking.  Having a job that requires long periods of standing or sitting in one place.  Being a certain age. Women in their 61s and 85s and men in their 50s are more likely to develop this condition. What are the signs or symptoms? Symptoms of this condition include:  Veins that are enlarged, bulging, or twisted  (varicose veins).  Skin breakdown or ulcers.  Reddened skin or dark discoloration of skin on the leg between the knee and ankle.  Brown, smooth, tight, and painful skin just above the ankle, usually on the inside of the leg (lipodermatosclerosis).  Swelling of the legs. How is this diagnosed? This condition may be diagnosed based on:  Your medical history.  A physical exam.  Tests, such as: ? A procedure that creates an image of a blood vessel and nearby organs and provides information about blood flow through the blood vessel (duplex ultrasound). ? A procedure that tests blood flow (plethysmography). ? A procedure that looks at the veins using X-ray and dye (venogram). How is this treated? The goals of treatment are to help you return to an active life and to minimize pain or disability. Treatment depends on the severity of your condition, and it may include:  Wearing compression stockings. These can help relieve symptoms and help prevent your condition from getting worse. However, they do not cure the condition.  Sclerotherapy. This procedure involves an injection of a solution that shrinks damaged veins.  Surgery. This may involve: ? Removing a diseased vein (vein stripping). ? Cutting off blood flow through the vein (laser ablation surgery). ? Repairing or reconstructing a valve within the affected vein. Follow these instructions at home:      Wear compression stockings as told by your health care provider. These stockings help to prevent blood clots and reduce swelling in your legs.  Take over-the-counter and prescription medicines only as  told by your health care provider.  Stay active by exercising, walking, or doing different activities. Ask your health care provider what activities are safe for you and how much exercise you need.  Drink enough fluid to keep your urine pale yellow.  Do not use any products that contain nicotine or tobacco, such as cigarettes,  e-cigarettes, and chewing tobacco. If you need help quitting, ask your health care provider.  Keep all follow-up visits as told by your health care provider. This is important. Contact a health care provider if you:  Have redness, swelling, or more pain in the affected area.  See a red streak or line that goes up or down from the affected area.  Have skin breakdown or skin loss in the affected area, even if the breakdown is small.  Get an injury in the affected area. Get help right away if:  You get an injury and an open wound in the affected area.  You have: ? Severe pain that does not get better with medicine. ? Sudden numbness or weakness in the foot or ankle below the affected area. ? Trouble moving your foot or ankle. ? A fever. ? Worse or persistent symptoms. ? Chest pain. ? Shortness of breath. Summary  Chronic venous insufficiency is a condition where the leg veins cannot effectively pump blood from the legs to the heart.  Chronic venous insufficiency occurs when the vein walls become stretched, weakened, or damaged, or when valves within the vein are damaged.  Treatment depends on how severe your condition is. It often involves wearing compression stockings and may involve having a procedure.  Make sure you stay active by exercising, walking, or doing different activities. Ask your health care provider what activities are safe for you and how much exercise you need. This information is not intended to replace advice given to you by your health care provider. Make sure you discuss any questions you have with your health care provider. Document Revised: 11/20/2017 Document Reviewed: 11/20/2017 Elsevier Patient Education  [place].

## 2019-07-07 NOTE — Progress Notes (Signed)
   CC: right leg swelling  HPI:  [name] is a 86 y.o. M with significant PMH as outlined below, who presents for right leg swelling. Please see problem-based charting for additional information.  Past Medical History:  Diagnosis Date  . Anemia   . BPH (benign prostatic hypertrophy)    TURP 05/19/13  . Cerebral embolism with cerebral infarction ([place]) 12/19/2013  . Chronic kidney disease    CHRONIC KIDNEY DISEASE, 2  . Difficulty hearing, right    BILATERAL HEARING LOSS - BEST TO TRY TO SPEAK INTO LEFT EAR  . DVT (deep venous thrombosis) ([place])   . Frequent falls   . History of DVT (deep vein thrombosis) 06/10/2013   Provoked s/p TURP. Dx per doppler 06/10/13. Complicated by hematuria while on anticoagulation s/p TURP. Initially on lovenox and coumadin, then stopped, then IVC filter placed 06/16/13. Anticoagulation restarted after hematuria stopped. Lovenox was discontinued apparently on 07/29/13 with comment on high risk for falls.   Total ~1.5 months of anticoagulation but interrupted with bleeding complication.  . Hyperlipidemia   . Hypertension   . Incontinence of urine    SOME INCONTINENCE  . Left adrenal mass ([place]) 09/11/2013  . Meningioma ([place])   . Stroke ([place])    Cerebellar, 2013; WALKS WITH WALKER, ABLE TO DRESS AND BATHE HIMSELF BUT FAMILY TRIES TO PROVIDE SUPERVISION BECAUSE OF HIS HX OF FALL AND WEAKNESS LEGS, ARMS   . Thrombocytopenia ([place])   . Vitreous hemorrhage ([place]) 12/21/2013   OS 12/18/13 CT /MRI brain done for ataxia    Review of Systems:   Review of Systems  Constitutional: Negative for chills, fever and malaise/fatigue.       No weight gain  Respiratory: Negative for cough and shortness of breath.   Cardiovascular: Positive for leg swelling. Negative for chest pain and palpitations.  Gastrointestinal: Negative for abdominal pain, nausea and vomiting.  Skin:       Redness of RLE   Physical Exam:  Vitals:   07/07/19 1120  BP: (!) 144/84  Pulse: 71    Temp: 97.8 F (36.6 C)  TempSrc: Oral  SpO2: 93%  Weight: 203 lb 1.6 oz (92.1 kg)   Physical Exam Vitals and nursing note reviewed.  Constitutional:      General: He is not in acute distress.    Comments: Pt is an elderly gentlemen in a wheelchair. Daughter present in exam room.  Cardiovascular:     Rate and Rhythm: Normal rate and regular rhythm.     Heart sounds: Normal heart sounds.  Pulmonary:     Effort: Pulmonary effort is normal.     Breath sounds: Normal breath sounds.  Abdominal:     General: Abdomen is flat. Bowel sounds are normal.     Palpations: Abdomen is soft.     Tenderness: There is no abdominal tenderness. There is no guarding or rebound.  Musculoskeletal:     Comments: Trace pitting edema of RLE. No LLE edema. Normal ROM in bilateral LE. Negative Homan's sign. RLE non-tender to palpation.  Skin:    Comments: Erythema and warmth over RLE as pictured below. No areas of ulceration or skin breakdown.  Neurological:     Mental Status: He is alert.      Assessment & Plan:   See Encounters Tab for problem based charting.  Patient discussed with Dr. Lynnae January

## 2019-07-07 NOTE — Assessment & Plan Note (Addendum)
Pt presents for right leg swelling. Daughter, [name], present today and provides the history. Daughter believes the RLE is always slightly larger than the left side, but noticed her father developed worsening right sided leg swelling six days ago. Then states two days after that the right leg became erythematous. A home health nurse came to evaluate the patient and notified this office. PCP recommended pt add 40mg  lasix in the PM in addition to pt's daily 80mg  lasix. Daughter began elevating the pt's legs up at night to sleep, which seemed to improve the swelling, so she did not increase pt's daily lasix dosage. Today, daughter states swelling is improved from initial presentation, but the erythema is persistent and unchanged. On examination, pt's RLE is warm and erythematous with trace pitting edema. No areas of ulceration or skin breakdown. Pt states the right leg is not painful. Negative Homan's sign. Pt's weight is 6lbs lower than last appointment 2 weeks ago. No evidence of volume overload on examination as lungs clear and LLE without edema.  Assessment - RLE cellulitis superimposed on venous insufficiency  - 5 day course of Keflex - continue leg elevation at bedtime - DME order for RLE compression stocking printed for pt's daughter to take to medical supply store - follow-up in 4 days for assessment of antibiotic response - instructed to follow-up or call the office sooner if pt develops pain, fever, chills, nausea/vomiting, or shortness of breath

## 2019-07-08 NOTE — Progress Notes (Signed)
Internal Medicine Clinic Attending  Case discussed with Dr. Jones at the time of the visit.  We reviewed the resident's history and exam and pertinent patient test results.  I agree with the assessment, diagnosis, and plan of care documented in the resident's note.  

## 2019-07-10 ENCOUNTER — Other Ambulatory Visit: Payer: Self-pay

## 2019-07-10 ENCOUNTER — Ambulatory Visit (INDEPENDENT_AMBULATORY_CARE_PROVIDER_SITE_OTHER): Payer: Medicare Other | Admitting: Internal Medicine

## 2019-07-10 ENCOUNTER — Encounter: Payer: Self-pay | Admitting: Internal Medicine

## 2019-07-10 VITALS — BP 132/81 | HR 72 | Temp 97.9°F | Wt 202.3 lb

## 2019-07-10 DIAGNOSIS — M7989 Other specified soft tissue disorders: Secondary | ICD-10-CM

## 2019-07-10 NOTE — Progress Notes (Signed)
   CC: cellulitis follow-up  HPI:  [name] is a 86 y.o. M with significant PMH as outlined below, who presents for cellulitis follow-up. Please see problem-based charting for additional information.   Past Medical History:  Diagnosis Date  . Anemia   . BPH (benign prostatic hypertrophy)    TURP 05/19/13  . Cerebral embolism with cerebral infarction ([place]) 12/19/2013  . Chronic kidney disease    CHRONIC KIDNEY DISEASE, 2  . Difficulty hearing, right    BILATERAL HEARING LOSS - BEST TO TRY TO SPEAK INTO LEFT EAR  . DVT (deep venous thrombosis) ([place])   . Frequent falls   . History of DVT (deep vein thrombosis) 06/10/2013   Provoked s/p TURP. Dx per doppler 06/10/13. Complicated by hematuria while on anticoagulation s/p TURP. Initially on lovenox and coumadin, then stopped, then IVC filter placed 06/16/13. Anticoagulation restarted after hematuria stopped. Lovenox was discontinued apparently on 07/29/13 with comment on high risk for falls.   Total ~1.5 months of anticoagulation but interrupted with bleeding complication.  . Hyperlipidemia   . Hypertension   . Incontinence of urine    SOME INCONTINENCE  . Left adrenal mass ([place]) 09/11/2013  . Meningioma ([place])   . Stroke ([place])    Cerebellar, 2013; WALKS WITH WALKER, ABLE TO DRESS AND BATHE HIMSELF BUT FAMILY TRIES TO PROVIDE SUPERVISION BECAUSE OF HIS HX OF FALL AND WEAKNESS LEGS, ARMS   . Thrombocytopenia ([place])   . Vitreous hemorrhage ([place]) 12/21/2013   OS 12/18/13 CT /MRI brain done for ataxia    Review of Systems:   Review of Systems  Constitutional: Negative for chills, fever and malaise/fatigue.  Respiratory: Negative for shortness of breath.   Cardiovascular: Negative for chest pain and palpitations.  Gastrointestinal: Negative for abdominal pain, nausea and vomiting.  Skin:       Improving RLE erythema  Neurological: Negative for weakness.   Physical Exam:  Vitals:   07/10/19 1539  BP: 132/81  Pulse: 72  Temp: 97.9 F  (36.6 C)  TempSrc: Oral  SpO2: 93%  Weight: 202 lb 4.8 oz (91.8 kg)   Physical Exam Vitals and nursing note reviewed.  Constitutional:      General: He is not in acute distress.    Comments: Elderly male sitting up in wheelchair.   Pulmonary:     Comments: Normal respiratory effort on room air. Musculoskeletal:     Comments: No pitting edema bilaterally. RLE non-tender to palpation. Normal ROM in right and left lower extremity.   Skin:    General: Skin is warm and dry.     Comments: Erythema much improved from prior examination and pictures 4 days ago. RLE is no longer warm. Mild erythema remains when compared to left side.  Neurological:     Mental Status: He is alert.          Assessment & Plan:   See Encounters Tab for problem based charting.  Patient discussed with Dr. Lynnae January

## 2019-07-10 NOTE — Patient Instructions (Addendum)
[name],  It was nice seeing you again today! I am glad your leg is looking better!  Please complete the last two days of antibiotics (Keflex) at home. Also, begin wearing compression stockings on your right leg. This can get fitted at any medical supply store.  Lauren will call you for your Medicare Annual Wellness Visit at the next available Monday.  Please Dr. Heber Dalmatia on June 10th at 10:45AM.  Thank you for letting us be a part of your care!

## 2019-07-11 ENCOUNTER — Encounter: Payer: Self-pay | Admitting: *Deleted

## 2019-07-11 NOTE — Progress Notes (Unsigned)

## 2019-07-16 ENCOUNTER — Other Ambulatory Visit: Payer: Self-pay | Admitting: *Deleted

## 2019-07-16 DIAGNOSIS — I5032 Chronic diastolic (congestive) heart failure: Secondary | ICD-10-CM

## 2019-07-17 MED ORDER — FUROSEMIDE 40 MG PO TABS: 80.0000 mg | ORAL_TABLET | Freq: Every day | ORAL | 1 refills | Status: DC

## 2019-07-17 NOTE — Assessment & Plan Note (Signed)
Pt presents today for follow-up of RLE cellulitis. Seen four days ago for progressive RLE swelling with examination revealing asymmetric erythema and warm of right lower leg with trace pitting edema. Since then, pt has taken 3 days of Keflex course at home. Denies fevers, chills, nausea/vomiting, abdominal pain, diarrhea, RLE pain, or progressive LE swelling. Examination of right leg today much improved from prior with only mild erythema present on the RLE when compared to left. The right side is no longer warm and no edema is appreciated. Per daughter, pt has continued on 80mg  PO lasix daily. Wt down 1lb today from four days ago and pt euvolemic on exam.  Assessment - improving RLE cellulitis  - complete last 2 days of antibiotic course - pt's daughter to get compression stockings for RLE - no change in outpatient diuretic dose - PCP follow-up in 6 weeks

## 2019-07-18 NOTE — Progress Notes (Signed)
Internal Medicine Clinic Attending  Case discussed with Dr. Jones at the time of the visit.  We reviewed the resident's history and exam and pertinent patient test results.  I agree with the assessment, diagnosis, and plan of care documented in the resident's note.  

## 2019-07-23 NOTE — Progress Notes (Unsigned)
Things That May Be Affecting Your Health:  Alcohol x Hearing loss  Pain    Depression  Home Safety  Sexual Health   Diabetes x Lack of physical activity  Stress  x Difficulty with daily activities  Loneliness  Tiredness   Drug use  Medicines  Tobacco use  x Falls  Motor Vehicle Safety  Weight   Food choices  Oral Health  Other    YOUR PERSONALIZED HEALTH PLAN : 1. Schedule your next subsequent Medicare Wellness visit in one year 2. Attend all of your regular appointments to address your medical issues 3. Complete the preventative screenings and services   Annual Wellness Visit   Medicare Covered Preventative Screenings and [place] Men and Women Who How Often Need? Date of Last Service Action  Abdominal Aortic Aneurysm Adults with AAA risk factors Once no    Alcohol Misuse and Counseling All Adults Screening once a year if no alcohol misuse. Counseling up to 4 face to face sessions.     Bone Density Measurement  Adults at risk for osteoporosis Once every 2 yrs no    Lipid Panel Z13.6 All adults without CV disease Once every 5 yrs  2020   Colorectal Cancer   Stool sample or  Colonoscopy All adults 66 and older   Once every year  Every 10 years no    Depression All Adults Once a year  Today   Diabetes Screening Blood glucose, post glucose load, or GTT Z13.1  All adults at risk  Pre-diabetics  Once per year  Twice per year  03/2019   Diabetes  Self-Management Training All adults Diabetics 10 hrs first year; 2 hours subsequent years. Requires Copay     Glaucoma  Diabetics  Family history of glaucoma  African Americans 32 yrs +  Hispanic Americans 35 yrs + Annually - requires coppay yes    Hepatitis C Z72.89 or F19.20  High Risk for HCV  Born between 1945 and 1965  Annually  Once     HIV Z11.4 All adults based on risk  Annually btw ages 108 & 70 regardless of risk  Annually > 65 yrs if at increased risk     Lung Cancer Screening  Asymptomatic adults aged 16-77 with 30 pack yr history and current smoker OR quit within the last 15 yrs Annually Must have counseling and shared decision making documentation before first screen     Medical Nutrition Therapy Adults with   Diabetes  Renal disease  Kidney transplant within past 3 yrs 3 hours first year; 2 hours subsequent years no    Obesity and Counseling All adults Screening once a year Counseling if BMI 30 or higher no Today   Tobacco Use Counseling Adults who use tobacco  Up to 8 visits in one year     Vaccines Z23  Hepatitis B  Influenza   Pneumonia  Adults   Once  Once every flu season  Two different vaccines separated by one year     Next Annual Wellness Visit People with Medicare Every year  Today     Services & Screenings Women Who How Often Need  Date of Last Service Action  Mammogram  Z12.31 Women over 82 One baseline ages 84-39. Annually ager 40 yrs+     Pap tests All women Annually if high risk. Every 2 yrs for normal risk women     Screening for cervical cancer with   Pap (Z01.419 nl or Z01.411abnl) &  HPV Z11.51 Women aged 51 to 43 Once every 5 yrs     Screening pelvic and breast exams All women Annually if high risk. Every 2 yrs for normal risk women     Sexually Transmitted Diseases  Chlamydia  Gonorrhea  Syphilis All at risk adults Annually for non pregnant females at increased risk         [place] Men Who How Ofter Need  Date of Last Service Action  Prostate Cancer - DRE & PSA Men over 50 Annually.  DRE might require a copay.     Sexually Transmitted Diseases  Syphilis All at risk adults Annually for men at increased risk

## 2019-08-04 ENCOUNTER — Encounter: Payer: Self-pay | Admitting: Internal Medicine

## 2019-08-04 ENCOUNTER — Ambulatory Visit (INDEPENDENT_AMBULATORY_CARE_PROVIDER_SITE_OTHER): Payer: Medicare Other | Admitting: Internal Medicine

## 2019-08-04 ENCOUNTER — Other Ambulatory Visit: Payer: Self-pay

## 2019-08-04 DIAGNOSIS — Z0000 Encounter for general adult medical examination without abnormal findings: Secondary | ICD-10-CM

## 2019-08-04 NOTE — Patient Instructions (Addendum)
Annual Wellness Visit   Medicare Covered Preventative Screenings and Services  Services & Screenings Men and Women Who How Often Need? Date of Last Service Action  Abdominal Aortic Aneurysm Adults with AAA risk factors Once no    Alcohol Misuse and Counseling All Adults Screening once a year if no alcohol misuse. Counseling up to 4 face to face sessions.     Bone Density Measurement  Adults at risk for osteoporosis Once every 2 yrs no    Lipid Panel Z13.6 All adults without CV disease Once every 5 yrs  2020   Colorectal Cancer   Stool sample or  Colonoscopy All adults 49 and older   Once every year  Every 10 years no    Depression All Adults Once a year  Today   Score of 0 on PHQ9   Diabetes Screening Blood glucose, post glucose load, or GTT Z13.1  All adults at risk  Pre-diabetics  Once per year  Twice per year  03/2019   Diabetes  Self-Management Training All adults Diabetics 10 hrs first year; 2 hours subsequent years. Requires Copay     Glaucoma  Diabetics  Family history of glaucoma  African Americans 30 yrs +  Hispanic Americans 48 yrs + Annually - requires coppay  Yes  Follows up with Dr. Hassell Done every year  Hepatitis C Z72.89 or F19.20  High Risk for HCV  Born between 1945 and 1965  Annually  Once     HIV Z11.4 All adults based on risk  Annually btw ages 18 & 35 regardless of risk  Annually > 67 yrs if at increased risk     Lung Cancer Screening Asymptomatic adults aged 51-77 with 73 pack yr history and current smoker OR quit within the last 15 yrs Annually Must have counseling and shared decision making documentation before first screen     Medical Nutrition Therapy Adults with   Diabetes  Renal disease  Kidney transplant within past 3 yrs 3 hours first year; 2 hours subsequent years  no   Obesity and Counseling All adults Screening once a year Counseling if BMI 30 or higher  no   Tobacco Use Counseling Adults who use tobacco  Up to 8 visits in one  year     Vaccines Z23  Hepatitis B  Influenza   Pneumonia  Adults   Once  Once every flu season  Two different vaccines separated by one year    Yearly flu vaccine starting 11/12/2019  Next Annual Wellness Visit People with Medicare Every year  Today  Completed today, 08/04/2019    Services & Screenings Women Who How Often Need  Date of Last Service Action  Mammogram  Z12.31 Women over 83 One baseline ages 52-39. Annually ager 40 yrs+     Pap tests All women Annually if high risk. Every 2 yrs for normal risk women     Screening for cervical cancer with   Pap (Z01.419 nl or Z01.411abnl) &  HPV Z11.51 Women aged 59 to 95 Once every 5 yrs     Screening pelvic and breast exams All women Annually if high risk. Every 2 yrs for normal risk women     Sexually Transmitted Diseases  Chlamydia  Gonorrhea  Syphilis All at risk adults Annually for non pregnant females at increased risk         [place] Men Who How Ofter Need  Date of Last Service Action  Prostate Cancer - DRE & PSA Men over  50 Annually.  DRE might require a copay.     Sexually Transmitted Diseases  Syphilis All at risk adults Annually for men at increased risk         Things That May Be Affecting Your Health:  Alcohol X Hearing loss  Pain    Depression  Home Safety  Sexual Health   Diabetes  Lack of physical activity  Stress  X Difficulty with daily activities  Loneliness  Tiredness   Drug use  Medicines  Tobacco use   Falls  Motor Vehicle Safety  Weight   Food choices  Oral Health  Other    YOUR PERSONALIZED HEALTH PLAN : 1. Schedule your next subsequent Medicare Wellness visit in one year 2. Attend all of your regular appointments to address your medical issues 3. Complete the preventative screenings and services 4. Begin seated and standing exercises with exercise band. 5.  Keep appointment with Dr. Heber Kent on 08/21/19 @ 10:45  Fall Prevention in the Home, Adult Falls can cause  injuries. They can happen to people of all ages. There are many things you can do to make your home safe and to help prevent falls. Ask for help when making these changes, if needed. What actions can I take to prevent falls? General Instructions  Use good lighting in all rooms. Replace any light bulbs that burn out.  Turn on the lights when you go into a dark area. Use night-lights.  Keep items that you use often in easy-to-reach places. Lower the shelves around your home if necessary.  Set up your furniture so you have a clear path. Avoid moving your furniture around.  Do not have throw rugs and other things on the floor that can make you trip.  Avoid walking on wet floors.  If any of your floors are uneven, fix them.  Add color or contrast paint or tape to clearly mark and help you see: ? Any grab bars or handrails. ? First and last steps of stairways. ? Where the edge of each step is.  If you use a stepladder: ? Make sure that it is fully opened. Do not climb a closed stepladder. ? Make sure that both sides of the stepladder are locked into place. ? Ask someone to hold the stepladder for you while you use it.  If there are any pets around you, be aware of where they are. What can I do in the bathroom?      Keep the floor dry. Clean up any water that spills onto the floor as soon as it happens.  Remove soap buildup in the tub or shower regularly.  Use non-skid mats or decals on the floor of the tub or shower.  Attach bath mats securely with double-sided, non-slip rug tape.  If you need to sit down in the shower, use a plastic, non-slip stool.  Install grab bars by the toilet and in the tub and shower. Do not use towel bars as grab bars. What can I do in the bedroom?  Make sure that you have a light by your bed that is easy to reach.  Do not use any sheets or blankets that are too big for your bed. They should not hang down onto the floor.  Have a firm chair that  has side arms. You can use this for support while you get dressed. What can I do in the kitchen?  Clean up any spills right away.  If you need to reach something above you,  use a strong step stool that has a grab bar.  Keep electrical cords out of the way.  Do not use floor polish or wax that makes floors slippery. If you must use wax, use non-skid floor wax. What can I do with my stairs?  Do not leave any items on the stairs.  Make sure that you have a light switch at the top of the stairs and the bottom of the stairs. If you do not have them, ask someone to add them for you.  Make sure that there are handrails on both sides of the stairs, and use them. Fix handrails that are broken or loose. Make sure that handrails are as long as the stairways.  Install non-slip stair treads on all stairs in your home.  Avoid having throw rugs at the top or bottom of the stairs. If you do have throw rugs, attach them to the floor with carpet tape.  Choose a carpet that does not hide the edge of the steps on the stairway.  Check any carpeting to make sure that it is firmly attached to the stairs. Fix any carpet that is loose or worn. What can I do on the outside of my home?  Use bright outdoor lighting.  Regularly fix the edges of walkways and driveways and fix any cracks.  Remove anything that might make you trip as you walk through a door, such as a raised step or threshold.  Trim any bushes or trees on the path to your home.  Regularly check to see if handrails are loose or broken. Make sure that both sides of any steps have handrails.  Install guardrails along the edges of any raised decks and porches.  Clear walking paths of anything that might make someone trip, such as tools or rocks.  Have any leaves, snow, or ice cleared regularly.  Use sand or salt on walking paths during winter.  Clean up any spills in your garage right away. This includes grease or oil spills. What other  actions can I take?  Wear shoes that: ? Have a low heel. Do not wear high heels. ? Have rubber bottoms. ? Are comfortable and fit you well. ? Are closed at the toe. Do not wear open-toe sandals.  Use tools that help you move around (mobility aids) if they are needed. These include: ? Canes. ? Walkers. ? Scooters. ? Crutches.  Review your medicines with your doctor. Some medicines can make you feel dizzy. This can increase your chance of falling. Ask your doctor what other things you can do to help prevent falls. Where to find more information  Centers for Disease Control and Prevention, STEADI: https://garcia.biz/  [employer] on Aging: BrainJudge.co.uk Contact a doctor if:  You are afraid of falling at home.  You feel weak, drowsy, or dizzy at home.  You fall at home. Summary  There are many simple things that you can do to make your home safe and to help prevent falls.  Ways to make your home safe include removing tripping hazards and installing grab bars in the bathroom.  Ask for help when making these changes in your home. This information is not intended to replace advice given to you by your health care provider. Make sure you discuss any questions you have with your health care provider. Document Revised: 06/20/2018 Document Reviewed: 10/12/2016 Elsevier Patient Education  2020 [place] Maintenance, Male Adopting a healthy lifestyle and getting preventive care are important in promoting  health and wellness. Ask your health care provider about:  The right schedule for you to have regular tests and exams.  Things you can do on your own to prevent diseases and keep yourself healthy. What should I know about diet, weight, and exercise? Eat a healthy diet   Eat a diet that includes plenty of vegetables, fruits, low-fat dairy products, and lean protein.  Do not eat a lot of foods that are high in solid fats, added sugars, or  sodium. Maintain a healthy weight Body mass index (BMI) is a measurement that can be used to identify possible weight problems. It estimates body fat based on height and weight. Your health care provider can help determine your BMI and help you achieve or maintain a healthy weight. Get regular exercise Get regular exercise. This is one of the most important things you can do for your health. Most adults should:  Exercise for at least 150 minutes each week. The exercise should increase your heart rate and make you sweat (moderate-intensity exercise).  Do strengthening exercises at least twice a week. This is in addition to the moderate-intensity exercise.  Spend less time sitting. Even light physical activity can be beneficial. Watch cholesterol and blood lipids Have your blood tested for lipids and cholesterol at 84 years of age, then have this test every 5 years. You may need to have your cholesterol levels checked more often if:  Your lipid or cholesterol levels are high.  You are older than 84 years of age.  You are at high risk for heart disease. What should I know about cancer screening? Many types of cancers can be detected early and may often be prevented. Depending on your health history and family history, you may need to have cancer screening at various ages. This may include screening for:  Colorectal cancer.  Prostate cancer.  Skin cancer.  Lung cancer. What should I know about heart disease, diabetes, and high blood pressure? Blood pressure and heart disease  High blood pressure causes heart disease and increases the risk of stroke. This is more likely to develop in people who have high blood pressure readings, are of African descent, or are overweight.  Talk with your health care provider about your target blood pressure readings.  Have your blood pressure checked: ? Every 3-5 years if you are 34-57 years of age. ? Every year if you are 31 years old or  older.  If you are between the ages of 62 and 86 and are a current or former smoker, ask your health care provider if you should have a one-time screening for abdominal aortic aneurysm (AAA). Diabetes Have regular diabetes screenings. This checks your fasting blood sugar level. Have the screening done:  Once every three years after age 49 if you are at a normal weight and have a low risk for diabetes.  More often and at a younger age if you are overweight or have a high risk for diabetes. What should I know about preventing infection? Hepatitis B If you have a higher risk for hepatitis B, you should be screened for this virus. Talk with your health care provider to find out if you are at risk for hepatitis B infection. Hepatitis C Blood testing is recommended for:  Everyone born from [birth year] through 1963.  Anyone with known risk factors for hepatitis C. Sexually transmitted infections (STIs)  You should be screened each year for STIs, including gonorrhea and chlamydia, if: ? You are sexually active and are  younger than 84 years of age. ? You are older than 84 years of age and your health care provider tells you that you are at risk for this type of infection. ? Your sexual activity has changed since you were last screened, and you are at increased risk for chlamydia or gonorrhea. Ask your health care provider if you are at risk.  Ask your health care provider about whether you are at high risk for HIV. Your health care provider may recommend a prescription medicine to help prevent HIV infection. If you choose to take medicine to prevent HIV, you should first get tested for HIV. You should then be tested every 3 months for as long as you are taking the medicine. Follow these instructions at home: Lifestyle  Do not use any products that contain nicotine or tobacco, such as cigarettes, e-cigarettes, and chewing tobacco. If you need help quitting, ask your health care provider.  Do not use  street drugs.  Do not share needles.  Ask your health care provider for help if you need support or information about quitting drugs. Alcohol use  Do not drink alcohol if your health care provider tells you not to drink.  If you drink alcohol: ? Limit how much you have to 0-2 drinks a day. ? Be aware of how much alcohol is in your drink. In the [place] one drink equals one 12 oz bottle of beer (355 mL), one 5 oz glass of wine (148 mL), or one 1 oz glass of hard liquor (44 mL). General instructions  Schedule regular health, dental, and eye exams.  Stay current with your vaccines.  Tell your health care provider if: ? You often feel depressed. ? You have ever been abused or do not feel safe at home. Summary  Adopting a healthy lifestyle and getting preventive care are important in promoting health and wellness.  Follow your health care provider's instructions about healthy diet, exercising, and getting tested or screened for diseases.  Follow your health care provider's instructions on monitoring your cholesterol and blood pressure. This information is not intended to replace advice given to you by your health care provider. Make sure you discuss any questions you have with your health care provider. Document Revised: 02/20/2018 Document Reviewed: 02/20/2018 Elsevier Patient Education  2020 [employer].

## 2019-08-04 NOTE — Progress Notes (Signed)
This AWV is being conducted by [place] only. The patient was located at home with daughter and I was located in [facility]. The patient's identity was confirmed using their DOB and current address. The patient or his/her legal guardian has consented to being evaluated through a telephone encounter and understands the associated risks (an examination cannot be done and the patient may need to come in for an appointment) / benefits (allows the patient to remain at home, decreasing exposure to coronavirus). I personally spent 55  minutes conducting the AWV.  Subjective:   [name] is a 86 y.o. male who presents with daughter for a Medicare Annual Wellness Visit.  The following items have been reviewed and updated today in the appropriate area in the EMR.   Health Risk Assessment  Height, weight, BMI, and BP Visual acuity if needed Depression screen Fall risk / safety level Advance directive discussion Medical and family history were reviewed and updated Updating list of other providers & suppliers Medication reconciliation, including over the counter medicines Cognitive screen Written screening schedule Risk Factor list Personalized health advice, risky behaviors, and treatment advice  Social History   Social History Narrative   Current Social History 08/04/2019        Patient lives with his daughter, [name] in a home which is 1 story. There are steps up to the entrance with handrails and a ramp which the patient uses.       Patient's method of transportation is via his daughter.      The highest level of education was junior high (8th grade).      The patient is currently retired.      Identified important Relationships as his daughter, [name].       Pets : No       Interests / Fun: He enjoys sitting outside on the porch.       Current Stressors: Can't do what he used to be able to do, per his daughter       Religious / Personal Beliefs: Catholic       Stepney, RN,BSN                   Objective:    Vitals: There were no vitals taken for this visit. Vitals are unable to obtained due to [id number] public health emergency  Activities of Daily Living In your present state of health, do you have any difficulty performing the following activities: 08/04/2019 07/10/2019  Hearing? Y Y  Comment hearing aid in left ear -  Vision? N Y  Comment cataract left eye -  Difficulty concentrating or making decisions? N Y  Walking or climbing stairs? [name]  Comment taking his time, uses handrail -  Dressing or bathing? [name]  Comment daughter assist -  Doing errands, shopping? Y Y  Comment with daughter -  Some recent data might be hidden    Goals Goals    . Blood Pressure < 150/90    . Increase strength in legs     Daughter will assist patient begin seated and standing exercises with exercise band.       . Prevent Falls       Fall Risk Fall Risk  08/04/2019 07/10/2019 07/07/2019 11/25/2018 10/28/2018  Falls in the past year? 0 0 0 0 0  Number falls in past yr: - - - - -  Comment - - - - -  Injury with Fall? - - - - -  Risk Factor Category  - - - - -  Risk for fall due to : History of fall(s) Impaired balance/gait;Impaired mobility;Impaired vision Impaired balance/gait;Impaired mobility - Impaired balance/gait;Impaired mobility  Risk for fall due to: Comment - - - - -  Follow up Falls evaluation completed Falls prevention discussed Falls prevention discussed - Falls prevention discussed  CDC Handout on Fall Prevention and Handout on Home Exercise Program, Access codes [id number] and [id number] given/mailed to patient with exercise band.   Depression Screen PHQ 2/9 Scores 08/04/2019 07/10/2019 07/07/2019 12/10/2018  PHQ - 2 Score 0 0 0 0  PHQ- 9 Score 0 - - -     Cognitive Testing Six-Item Cognitive Screener   "I would like to ask you some questions that ask you to use your memory. I am going to name three objects. Please wait until I say all  three words, then repeat them. Remember what they are  because I am going to ask you to name them again in a few minutes. Please repeat these words for me: APPLE--TABLE--PENNY." (Interviewer may repeat names 3 times if necessary but repetition not scored.)  Did patient correctly repeat all three words? Yes  What year is this? Incorrect, 2001 What month is this? Incorrect, July What day of the week is this? Correct  What were the three objects I asked you to remember?  . Apple Unable to state . Table Unable to state . [name] Unable to state  Score one point for each incorrect answer.  A score of 2 or more points warrants additional investigation.  Patient's score 1     Assessment and Plan:    Patient has an appointment with PCP, Dr. Heber [place], on 08/21/19 @ 1045.  Patient's daughter, [name], will assist patient with seated exercises with exercise band.  During the course of the visit the patient was educated and counseled about appropriate screening and preventive services as documented in the assessment and plan.  The printed AVS was given to the patient and included an updated screening schedule, a list of risk factors, and personalized health advice.        Jimmy Roger, RN  08/04/2019

## 2019-08-04 NOTE — Progress Notes (Signed)
I discussed the AWV findings with the RN who conducted the visit. I was present in the office suite and immediately available to provide assistance and direction throughout the time the service was provided.  Gilberto Better, PGY-2  [place] Pager: [phone]

## 2019-08-06 MED ORDER — TRIAMCINOLONE ACETONIDE 0.1 % EX OINT: 1.0000 "application " | TOPICAL_OINTMENT | Freq: Two times a day (BID) | CUTANEOUS | 1 refills | Status: DC | PRN

## 2019-08-11 ENCOUNTER — Other Ambulatory Visit: Payer: Self-pay

## 2019-08-11 ENCOUNTER — Emergency Department (HOSPITAL_COMMUNITY): Payer: Medicare Other

## 2019-08-11 ENCOUNTER — Encounter (HOSPITAL_COMMUNITY): Payer: Self-pay

## 2019-08-11 ENCOUNTER — Observation Stay (HOSPITAL_COMMUNITY)
Admission: EM | Admit: 2019-08-11 | Discharge: 2019-08-13 | Disposition: A | Discharge status: Home / Self Care | Payer: Medicare Other | Attending: Internal Medicine | Admitting: Internal Medicine

## 2019-08-11 DIAGNOSIS — I5032 Chronic diastolic (congestive) heart failure: Secondary | ICD-10-CM | POA: Diagnosis present

## 2019-08-11 DIAGNOSIS — I130 Hypertensive heart and chronic kidney disease with heart failure and stage 1 through stage 4 chronic kidney disease, or unspecified chronic kidney disease: Secondary | ICD-10-CM | POA: Insufficient documentation

## 2019-08-11 DIAGNOSIS — F0150 Vascular dementia without behavioral disturbance: Secondary | ICD-10-CM | POA: Diagnosis not present

## 2019-08-11 DIAGNOSIS — I69393 Ataxia following cerebral infarction: Secondary | ICD-10-CM | POA: Insufficient documentation

## 2019-08-11 DIAGNOSIS — I10 Essential (primary) hypertension: Secondary | ICD-10-CM | POA: Diagnosis present

## 2019-08-11 DIAGNOSIS — I69993 Ataxia following unspecified cerebrovascular disease: Secondary | ICD-10-CM

## 2019-08-11 DIAGNOSIS — H9190 Unspecified hearing loss, unspecified ear: Secondary | ICD-10-CM

## 2019-08-11 DIAGNOSIS — Z7982 Long term (current) use of aspirin: Secondary | ICD-10-CM | POA: Insufficient documentation

## 2019-08-11 DIAGNOSIS — Z86718 Personal history of other venous thrombosis and embolism: Secondary | ICD-10-CM | POA: Diagnosis not present

## 2019-08-11 DIAGNOSIS — R29898 Other symptoms and signs involving the musculoskeletal system: Secondary | ICD-10-CM | POA: Insufficient documentation

## 2019-08-11 DIAGNOSIS — R4781 Slurred speech: Secondary | ICD-10-CM | POA: Diagnosis present

## 2019-08-11 DIAGNOSIS — Z79899 Other long term (current) drug therapy: Secondary | ICD-10-CM | POA: Insufficient documentation

## 2019-08-11 DIAGNOSIS — N183 Chronic kidney disease, stage 3 unspecified: Secondary | ICD-10-CM | POA: Diagnosis present

## 2019-08-11 DIAGNOSIS — Z20822 Contact with and (suspected) exposure to covid-19: Secondary | ICD-10-CM | POA: Diagnosis not present

## 2019-08-11 DIAGNOSIS — R531 Weakness: Secondary | ICD-10-CM

## 2019-08-11 DIAGNOSIS — R5381 Other malaise: Secondary | ICD-10-CM | POA: Diagnosis present

## 2019-08-11 DIAGNOSIS — D329 Benign neoplasm of meninges, unspecified: Secondary | ICD-10-CM | POA: Diagnosis present

## 2019-08-11 LAB — DIFFERENTIAL
Abs Immature Granulocytes: 0.02 10*3/uL (ref 0.00–0.07)
Basophils Absolute: 0 10*3/uL (ref 0.0–0.1)
Basophils Relative: 0 %
Eosinophils Absolute: 0.3 10*3/uL (ref 0.0–0.5)
Eosinophils Relative: 3 %
Immature Granulocytes: 0 %
Lymphocytes Relative: 18 %
Lymphs Abs: 1.5 10*3/uL (ref 0.7–4.0)
Monocytes Absolute: 0.7 10*3/uL (ref 0.1–1.0)
Monocytes Relative: 8 %
Neutro Abs: 5.7 10*3/uL (ref 1.7–7.7)
Neutrophils Relative %: 71 %

## 2019-08-11 LAB — COMPREHENSIVE METABOLIC PANEL
ALT: 11 U/L (ref 0–44)
AST: 18 U/L (ref 15–41)
Albumin: 3.3 g/dL — ABNORMAL LOW (ref 3.5–5.0)
Alkaline Phosphatase: 52 U/L (ref 38–126)
Anion gap: 10 (ref 5–15)
BUN: 22 mg/dL (ref 8–23)
CO2: 28 mmol/L (ref 22–32)
Calcium: 10 mg/dL (ref 8.9–10.3)
Chloride: 105 mmol/L (ref 98–111)
Creatinine, Ser: 1.39 mg/dL — ABNORMAL HIGH (ref 0.61–1.24)
GFR calc Af Amer: 51 mL/min — ABNORMAL LOW (ref >60)
GFR calc non Af Amer: 44 mL/min — ABNORMAL LOW (ref >60)
Glucose, Bld: 111 mg/dL — ABNORMAL HIGH (ref 70–99)
Potassium: 4 mmol/L (ref 3.5–5.1)
Sodium: 143 mmol/L (ref 135–145)
Total Bilirubin: 0.6 mg/dL (ref 0.3–1.2)
Total Protein: 7.4 g/dL (ref 6.5–8.1)

## 2019-08-11 LAB — SARS CORONAVIRUS 2 BY RT PCR (HOSPITAL ORDER, PERFORMED IN ~~LOC~~ HOSPITAL LAB): SARS Coronavirus 2: NEGATIVE

## 2019-08-11 LAB — CBC
HCT: 43 % (ref 39.0–52.0)
Hemoglobin: 13.4 g/dL (ref 13.0–17.0)
MCH: 29.6 pg (ref 26.0–34.0)
MCHC: 31.2 g/dL (ref 30.0–36.0)
MCV: 95.1 fL (ref 80.0–100.0)
Platelets: 168 10*3/uL (ref 150–400)
RBC: 4.52 MIL/uL (ref 4.22–5.81)
RDW: 13.8 % (ref 11.5–15.5)
WBC: 8.2 10*3/uL (ref 4.0–10.5)
nRBC: 0 % (ref 0.0–0.2)

## 2019-08-11 LAB — URINALYSIS, ROUTINE W REFLEX MICROSCOPIC
Bilirubin Urine: NEGATIVE
Glucose, UA: NEGATIVE mg/dL
Hgb urine dipstick: NEGATIVE
Ketones, ur: NEGATIVE mg/dL
Nitrite: NEGATIVE
Protein, ur: NEGATIVE mg/dL
Specific Gravity, Urine: 1.006 (ref 1.005–1.030)
pH: 6 (ref 5.0–8.0)

## 2019-08-11 LAB — PROTIME-INR
INR: 1.1 (ref 0.8–1.2)
Prothrombin Time: 13.6 seconds (ref 11.4–15.2)

## 2019-08-11 LAB — CBG MONITORING, ED: Glucose-Capillary: 94 mg/dL (ref 70–99)

## 2019-08-11 LAB — APTT: aPTT: 34 seconds (ref 24–36)

## 2019-08-11 MED ORDER — SODIUM CHLORIDE 0.9 % IV SOLN: 1.0000 g | Freq: Once | INTRAVENOUS | Status: DC

## 2019-08-11 MED ORDER — TRIAMCINOLONE ACETONIDE 0.1 % EX OINT
1.0000 "application " | TOPICAL_OINTMENT | Freq: Two times a day (BID) | CUTANEOUS | Status: DC | PRN
  Filled 2019-08-11: qty 15

## 2019-08-11 MED ORDER — FUROSEMIDE 80 MG PO TABS
80.0000 mg | ORAL_TABLET | Freq: Every day | ORAL | Status: DC
  Administered 2019-08-11 – 2019-08-13 (×3): 80 mg via ORAL
  Filled 2019-08-11 (×3): qty 1

## 2019-08-11 MED ORDER — ATORVASTATIN CALCIUM 10 MG PO TABS
20.0000 mg | ORAL_TABLET | Freq: Every day | ORAL | Status: DC
  Administered 2019-08-11 – 2019-08-13 (×3): 20 mg via ORAL
  Filled 2019-08-11 (×3): qty 2

## 2019-08-11 MED ORDER — CEPHALEXIN 250 MG PO CAPS: 500.0000 mg | ORAL_CAPSULE | Freq: Once | ORAL | Status: DC

## 2019-08-11 MED ORDER — SODIUM CHLORIDE 0.9% FLUSH
3.0000 mL | Freq: Once | INTRAVENOUS | Status: DC
Start: 2019-08-11 — End: 2019-08-13

## 2019-08-11 MED ORDER — LEVETIRACETAM ER 500 MG PO TB24
1000.0000 mg | ORAL_TABLET | Freq: Every day | ORAL | Status: DC
  Administered 2019-08-11 – 2019-08-12 (×2): 1000 mg via ORAL
  Filled 2019-08-11 (×4): qty 2

## 2019-08-11 MED ORDER — ASPIRIN EC 81 MG PO TBEC
81.0000 mg | DELAYED_RELEASE_TABLET | Freq: Every day | ORAL | Status: DC
  Administered 2019-08-11 – 2019-08-13 (×3): 81 mg via ORAL
  Filled 2019-08-11 (×3): qty 1

## 2019-08-11 MED ORDER — AMLODIPINE BESYLATE 5 MG PO TABS
5.0000 mg | ORAL_TABLET | Freq: Every day | ORAL | Status: DC
  Administered 2019-08-11 – 2019-08-13 (×3): 5 mg via ORAL
  Filled 2019-08-11 (×3): qty 1

## 2019-08-11 MED ORDER — ENOXAPARIN SODIUM 40 MG/0.4ML ~~LOC~~ SOLN
40.0000 mg | SUBCUTANEOUS | Status: DC
  Administered 2019-08-11 – 2019-08-12 (×2): 40 mg via SUBCUTANEOUS
  Filled 2019-08-11 (×2): qty 0.4

## 2019-08-11 NOTE — ED Notes (Signed)
Walked Pt with the walker  Pt was leaning toward the left more Pt walked very slow and did almost go down but the  pt had a person on each side

## 2019-08-11 NOTE — ED Provider Notes (Signed)
[facility] EMERGENCY DEPARTMENT Provider Note   CSN: [id number] Arrival date & time: 08/11/19  1246     History No chief complaint on file.   [name] is a 86 y.o. male.  HPI He presents for evaluation of trouble walking, slurred speech and weakness, that apparently started yesterday.  He came by EMS with last known normal reported to be 1800, yesterday.  Level 5 caveat-speech is unintelligible    Past Medical History:  Diagnosis Date  . Anemia   . BPH (benign prostatic hypertrophy)    TURP 05/19/13  . Cerebral embolism with cerebral infarction ([place]) 12/19/2013  . Chronic kidney disease    CHRONIC KIDNEY DISEASE, 2  . Difficulty hearing, right    BILATERAL HEARING LOSS - BEST TO TRY TO SPEAK INTO LEFT EAR  . DVT (deep venous thrombosis) ([place])   . Frequent falls   . History of DVT (deep vein thrombosis) 06/10/2013   Provoked s/p TURP. Dx per doppler 06/10/13. Complicated by hematuria while on anticoagulation s/p TURP. Initially on lovenox and coumadin, then stopped, then IVC filter placed 06/16/13. Anticoagulation restarted after hematuria stopped. Lovenox was discontinued apparently on 07/29/13 with comment on high risk for falls.   Total ~1.5 months of anticoagulation but interrupted with bleeding complication.  . Hyperlipidemia   . Hypertension   . Incontinence of urine    SOME INCONTINENCE  . Left adrenal mass ([place]) 09/11/2013  . Meningioma ([place])   . Stroke ([place])    Cerebellar, 2013; WALKS WITH WALKER, ABLE TO DRESS AND BATHE HIMSELF BUT FAMILY TRIES TO PROVIDE SUPERVISION BECAUSE OF HIS HX OF FALL AND WEAKNESS LEGS, ARMS   . Thrombocytopenia ([place])   . Vitreous hemorrhage ([place]) 12/21/2013   OS 12/18/13 CT /MRI brain done for ataxia     Patient Active Problem List   Diagnosis Date Noted  . Right leg swelling 07/07/2019  . Cerebrovascular accident (CVA) ([place]) 04/17/2019  . Cognitive impairment 04/17/2019  . Partial symptomatic epilepsy with complex  partial seizures, not intractable, without status epilepticus ([place]) 04/17/2019  . Iron deficiency anemia due to chronic blood loss   . Cerebral embolism with cerebral infarction 11/03/2018  . Incontinence 10/28/2018  . Bradycardia, sinus   . Syncope 10/17/2018  . Chronic diastolic (congestive) heart failure ([place]) 10/28/2017  . Renal cyst 10/28/2017  . Shortness of breath on exertion 10/03/2017  . Generalized weakness 02/19/2017  . 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 Dementia ([place]) 01/28/2016  . Hypernatremia   . UTI (urinary tract infection) 07/17/2014  . Presence of IVC filter 07/17/2014  . Onychomycosis 01/15/2014  . Orthostatic hypotension 07/10/2013  . CKD (chronic kidney disease) stage 3, GFR 30-59 ml/min (HCC) 06/23/2013  . Hyperlipidemia   . Urinary retention 06/15/2013  . Normocytic anemia 06/11/2013  . Physical deconditioning 05/30/2013  . BPH (benign prostatic hyperplasia) 04/24/2013  . Frequent falls 06/23/2012  . 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 Hearing loss 07/20/2011  . Meningioma ([place]) 07/20/2011  . HTN (hypertension) 07/19/2011  . Cerebral infarction ([place]) 07/19/2011    Past Surgical History:  Procedure Laterality Date  . CYSTOSCOPY N/A 06/13/2013   Procedure: CYSTOSCOPY FLEXIBLE BEDSIDE;  Surgeon: Ardis Hughs, MD;  Location: [facility];  Service: Urology;  Laterality: N/A;  . MYRINGOTOMY WITH TUBE PLACEMENT Bilateral   . TRANSURETHRAL RESECTION OF PROSTATE N/A 05/19/2013   Procedure: TRANSURETHRAL RESECTION OF THE PROSTATE WITH GYRUS INSTRUMENTS;  Surgeon: Ailene Rud,  MD;  Location: [facility];  Service: Urology;  Laterality: N/A;       Family History  Problem Relation Age of Onset  . Diabetes Mother   . Heart disease Mother   . Hypertension Mother   . Heart disease Father   . Hypertension Father   . Hypertension Daughter   . Thyroid disease Daughter     Social History   Tobacco  Use  . Smoking status: Never Smoker  . Smokeless tobacco: Never Used  Substance Use Topics  . Alcohol use: No    Alcohol/week: 0.0 standard drinks  . Drug use: No    Home Medications Prior to Admission medications   Medication Sig Start Date End Date Taking? Authorizing Provider  amLODipine (NORVASC) 5 MG tablet Take 1 tablet (5 mg total) by mouth daily. 02/17/19   [name], DO  aspirin EC 81 MG EC tablet Take 1 tablet (81 mg total) by mouth daily. 11/04/18   Jose Persia, MD  atorvastatin (LIPITOR) 20 MG tablet Take 1 tablet (20 mg total) by mouth daily. Patient taking differently: Take 20 mg by mouth every evening.  02/21/19   [name], DO  furosemide (LASIX) 40 MG tablet Take 2 tablets (80 mg total) by mouth daily. 07/17/19   [name], DO  levETIRAcetam (KEPPRA XR) 500 MG 24 hr tablet Take 2 tablets (1,000 mg total) by mouth at bedtime. 06/19/19   Frann Rider, NP  triamcinolone ointment (KENALOG) 0.1 % Apply 1 application topically 2 (two) times daily as needed (For leg rash). 08/06/19   [name], DO    Allergies    Patient has no known allergies.  Review of Systems   Review of Systems  Unable to perform ROS: Other    Physical Exam Updated Vital Signs BP (!) 143/90 (BP Location: Left Arm)   Pulse 80   Temp 98.6 F (37 C) (Oral)   Resp 17   SpO2 93%   Physical Exam Vitals and nursing note reviewed.  Constitutional:      General: He is not in acute distress.    Appearance: He is well-developed. He is not ill-appearing, toxic-appearing or diaphoretic.  HENT:     Head: Normocephalic and atraumatic.     Right Ear: External ear normal.     Left Ear: External ear normal.  Eyes:     Conjunctiva/sclera: Conjunctivae normal.     Pupils: Pupils are equal, round, and reactive to light.  Neck:     Trachea: Phonation normal.  Cardiovascular:     Rate and Rhythm: Normal rate and regular rhythm.     Heart sounds: Normal heart sounds.  Pulmonary:      Effort: Pulmonary effort is normal.     Breath sounds: Normal breath sounds.  Abdominal:     Palpations: Abdomen is soft.     Tenderness: There is no abdominal tenderness.  Musculoskeletal:        General: Normal range of motion.     Cervical back: Normal range of motion and neck supple.     Right lower leg: Edema present.     Left lower leg: Edema present.  Skin:    General: Skin is warm and dry.     Coloration: Skin is not jaundiced or pale.  Neurological:     Mental Status: He is alert.     Cranial Nerves: No cranial nerve deficit.     Sensory: No sensory deficit.     Motor: No  abnormal muscle tone.     Comments: Dysarthria present.  Plus/minus expressive aphasia.  No receptive aphasia.  Follows commands accurately.  Normal strength, arms and legs bilaterally.  No facial asymmetry.  Psychiatric:        Mood and Affect: Mood normal.        Behavior: Behavior normal.     ED Results / Procedures / Treatments   Labs (all labs ordered are listed, but only abnormal results are displayed) Labs Reviewed  COMPREHENSIVE METABOLIC PANEL - Abnormal; Notable for the following components:      Result Value   Glucose, Bld 111 (*)    Creatinine, Ser 1.39 (*)    Albumin 3.3 (*)    GFR calc non Af Amer 44 (*)    GFR calc Af Amer 51 (*)    All other components within normal limits  URINALYSIS, ROUTINE W REFLEX MICROSCOPIC - Abnormal; Notable for the following components:   Color, Urine STRAW (*)    Leukocytes,Ua MODERATE (*)    Bacteria, UA RARE (*)    All other components within normal limits  SARS CORONAVIRUS 2 BY RT PCR (HOSPITAL ORDER, [facility])  PROTIME-INR  APTT  CBC  DIFFERENTIAL  CBG MONITORING, ED    EKG EKG Interpretation  Date/Time:  Monday Aug 11 2019 12:54:37 EDT Ventricular Rate:  79 PR Interval:  176 QRS Duration: 116 QT Interval:  386 QTC Calculation: 442 R Axis:   -32 Text Interpretation: Sinus rhythm with occasional  Premature ventricular complexes Left axis deviation Since last tracing pvc new Otherwise no significant change Confirmed by Daleen Bo [phone]) on 08/11/2019 2:39:32 PM   Radiology CT HEAD WO CONTRAST  Result Date: 08/11/2019 CLINICAL DATA:  Acute neuro deficit.  Possible stroke. EXAM: CT HEAD WITHOUT CONTRAST TECHNIQUE: Contiguous axial images were obtained from the base of the skull through the vertex without intravenous contrast. COMPARISON:  CT head 04/13/2019 FINDINGS: Brain: Moderate atrophy. Large chronic infarct right cerebellum. Small chronic infarct left cerebellum. Chronic infarct in the occipital lobes bilaterally. Chronic infarct in the high right frontal lobe. Extensive chronic microvascular ischemic change in the white matter. 2.5 cm calcified mass along the left orbital roof and anterior clinoid compatible with meningioma. This is unchanged from the prior study. Negative for acute hemorrhage. Vascular: Negative for hyperdense vessel Skull: No focal skull lesion. Sinuses/Orbits: Chronic fracture left medial orbit. Mild mucosal edema paranasal sinuses. No orbital mass. Right cataract extraction. Other: None IMPRESSION: Negative for acute infarct or hemorrhage. Atrophy and extensive chronic ischemic change 2.5 cm calcified meningioma left anterior clinoid, unchanged from prior studies. These results were called by telephone at the time of interpretation on 08/11/2019 at 4:06 pm to provider Eulis Foster, who verbally acknowledged these results. Electronically Signed   By: Franchot Gallo M.D.   On: 08/11/2019 16:07    Procedures Procedures (including critical care time)  Medications Ordered in ED Medications  sodium chloride flush (NS) 0.9 % injection 3 mL (3 mLs Intravenous Not Given 08/11/19 1517)    ED Course  I have reviewed the triage vital signs and the nursing notes.  Pertinent labs & imaging results that were available during my care of the patient were reviewed by me and considered  in my medical decision making (see chart for details).  Clinical Course as of Aug 11 1655  Mon Aug 11, 2019  1539 Patient daughter is now here and able to give information about his clinical progression over the last  several days.  She has noticed that he has been urinating less, 3 and 4 days ago then yesterday he began to be more dysarthric than usual.  That lasted for short period of time then improved.  Today she was concerned because he was weaker on the left side, when usually his weakness is on the right side.  He had more trouble walking today while using his walker.  At that time she decided to have him transferred here for evaluation.  She feels like his speech is at his baseline at this time.   [EW]  1647 Glucose, UA: NEGATIVE [EW]  [id number] Per radiologist, no acute changes in left-sided meningioma or other acute abnormalities.  CT HEAD WO CONTRAST [EW]  1650 Normal except presence of leukocytes, and rare bacteria  Urinalysis, Routine w reflex microscopic(!) [EW]  1650 Normal  SARS Coronavirus 2 by RT PCR (hospital order, performed in [facility] hospital lab) Nasopharyngeal Nasopharyngeal Swab [EW]  1650 Normal except glucose high, creatinine high, albumin low, GFR low  Comprehensive metabolic panel(!) [EW]  [id number] Case was discussed with neuro hospitalist, regarding appearance of meningioma and possible acute changes.  He recommends MRI for possible stroke, and feels the meningioma is an unchanged calcified abnormality.   [EW]  1651 Normal  CBC [EW]  1653 Nitrite: NEGATIVE [EW]    Clinical Course User Index [EW] Daleen Bo, MD   MDM Rules/Calculators/A&P                       Patient Vitals for the past 24 hrs:  BP Temp Temp src Pulse Resp SpO2  08/11/19 1248 (!) 143/90 98.6 F (37 C) Oral 80 17 93 %     Medical Decision Making:  This patient is presenting for evaluation of weakness and trouble walking, which does require a range of treatment options, and is a complaint  that involves a high risk of morbidity and mortality. The differential diagnoses include stroke, intoxication, acute medical illness. I decided to review old records, and in summary Male with hearing dysfunction, history of meningioma left frontal, CHF and prior stroke presenting from home for evaluation of weakness and dysarthria..  I got additional historical information from his daughter at the bedside.  Clinical Laboratory Tests Ordered, included CBC, Metabolic panel and Urinalysis. Review indicates reassuring findings, no changes from baseline. Radiologic Tests Ordered, included CT head.  I independently Visualized: CT images, which show intraparenchymal bleed left frontal, with edema  Cardiac Monitor Tracing which shows normal sinus rhythm    Critical Interventions-clinical evaluation, laboratory testing, CT imaging, MRI imaging, observation, discussion with family member daughter.  After These Interventions, the Patient was reevaluated and was found to require MRI imaging, for diagnosis of acute brain injury/CVA  CRITICAL CARE-yes Performed by: Daleen Bo  Nursing Notes Reviewed/ Care Coordinated Applicable Imaging Reviewed Interpretation of Laboratory Data incorporated into ED treatment   Plan-disposition by Dr. Regenia Skeeter, after MRI imaging.  If MRI negative I would attempt ambulating then determine course of care.    Final Clinical Impression(s) / ED Diagnoses Final diagnoses:  Left-sided weakness    Rx / DC Orders ED Discharge Orders    None       Daleen Bo, MD 08/11/19 1657

## 2019-08-11 NOTE — ED Triage Notes (Signed)
Patient had trouble ambulating, slurred speech with weakness yesterday. LKW 1800 last night. On arrival no arm drift noted. Daughter reports that patient has had strokes in past that affected right side. MAE X 4. Patient extremely hard of hearing

## 2019-08-11 NOTE — ED Notes (Signed)
Urine culture (gray top) sent down with urine.

## 2019-08-11 NOTE — H&P (Signed)
Date: 08/12/2019               Patient Name:  [name] MRN: [id number]  DOB: Nov 04, 1924 Age / Sex: 86 y.o., male   PCP: [name], DO         Medical Service: Internal Medicine Teaching Service         Attending Physician: Dr. Rebeca Alert Raynaldo Opitz, MD    First Contact: Dr. Darrick Meigs Pager: [phone]  Second Contact: Dr. Sharon Seller Pager: [phone]       After Hours (After 5p/  First Contact Pager: [phone]  weekends / holidays): Second Contact Pager: [phone]   Chief Complaint: "walking different"  History of Present Illness: [name] is a 86 yo male w/ a PMHx notable for HTN, DVT, CKDIII, HLD, meningioma, CVA now off anticoagulation due to GI bleed, and anemia who presented for a change in his walking pattern initially concerning for CVA. Patients daughter with whom he lives noted that over the past four days he has been walking differently. He normally favors his left side dragging his right leg while walking with his walker. Of recent, he has relied more on his right leg and is using his left leg less. Per the patients daughter, his physical therapist noted a difference as well this past Thursday but could not quite quantify the change then. They denied LOC, new weakness or associated symptoms. Per the patient, he is fine and does not know why his daughter brought him here. Daughter notes that he uses his wheelchair at least half of the time and walks with a walker on occassion. He needs significant assistance with his ADL's and can not perform his IADL's at all.   He denied dysuria, hematuria, abdominal pain, chest pain, fever, chills, weakness, nausea, rash or joint pain.  In the ED, a CT head and MRI were negative for acute CVA and his symptoms appeared to have resolved although he needed maximal support to walk.   Meds:  Current Outpatient Medications  Medication Instructions  . amLODipine (NORVASC) 5 mg, Oral, Daily  . aspirin 81 mg, Oral, Daily  . atorvastatin  (LIPITOR) 20 mg, Oral, Daily  . furosemide (LASIX) 80 mg, Oral, Daily  . levETIRAcetam (KEPPRA XR) 1,000 mg, Oral, Daily at bedtime  . triamcinolone ointment (KENALOG) 0.1 % 1 application, Topical, 2 times daily PRN   Allergies: Allergies as of 08/11/2019  . (No Known Allergies)   Past Medical History:  Diagnosis Date  . Anemia   . BPH (benign prostatic hypertrophy)    TURP 05/19/13  . Cerebral embolism with cerebral infarction ([place]) 12/19/2013  . Chronic kidney disease    CHRONIC KIDNEY DISEASE, 2  . Difficulty hearing, right    BILATERAL HEARING LOSS - BEST TO TRY TO SPEAK INTO LEFT EAR  . DVT (deep venous thrombosis) ([place])   . Frequent falls   . History of DVT (deep vein thrombosis) 06/10/2013   Provoked s/p TURP. Dx per doppler 06/10/13. Complicated by hematuria while on anticoagulation s/p TURP. Initially on lovenox and coumadin, then stopped, then IVC filter placed 06/16/13. Anticoagulation restarted after hematuria stopped. Lovenox was discontinued apparently on 07/29/13 with comment on high risk for falls.   Total ~1.5 months of anticoagulation but interrupted with bleeding complication.  . Hyperlipidemia   . Hypertension   . Incontinence of urine    SOME INCONTINENCE  . Left adrenal mass ([place]) 09/11/2013  . Meningioma ([place])   . Stroke [facility])  Cerebellar, 2013; WALKS WITH WALKER, ABLE TO DRESS AND BATHE HIMSELF BUT FAMILY TRIES TO PROVIDE SUPERVISION BECAUSE OF HIS HX OF FALL AND WEAKNESS LEGS, ARMS   . Thrombocytopenia ([place])   . Vitreous hemorrhage ([place]) 12/21/2013   OS 12/18/13 CT /MRI brain done for ataxia    Family History:  Family History  Problem Relation Age of Onset  . Diabetes Mother   . Heart disease Mother   . Hypertension Mother   . Heart disease Father   . Hypertension Father   . Hypertension Daughter   . Thyroid disease Daughter    Social History:  Social History   Tobacco Use  . Smoking status: Never Smoker  . Smokeless tobacco: Never Used  Substance  Use Topics  . Alcohol use: No    Alcohol/week: 0.0 standard drinks  . Drug use: No   Review of Systems: A complete ROS was negative except as per HPI.   Physical Exam: Blood pressure (!) 147/100, pulse 96, temperature 98.2 F (36.8 C), temperature source Oral, resp. rate (!) 24, SpO2 95 %. Physical Exam Constitutional:      General: He is not in acute distress.    Appearance: He is well-developed. He is not diaphoretic.  HENT:     Head: Normocephalic and atraumatic.     Nose: Nose normal. No congestion.     Mouth/Throat:     Mouth: Mucous membranes are moist.     Pharynx: Oropharynx is clear.  Eyes:     Extraocular Movements: Extraocular movements intact.     Conjunctiva/sclera: Conjunctivae normal.     Pupils: Pupils are equal, round, and reactive to light.  Cardiovascular:     Rate and Rhythm: Normal rate and regular rhythm.     Heart sounds: No murmur.  Pulmonary:     Effort: Pulmonary effort is normal. No respiratory distress.     Breath sounds: Normal breath sounds. No stridor.  Abdominal:     General: Bowel sounds are normal. There is no distension.     Palpations: Abdomen is soft.  Musculoskeletal:        General: No swelling or tenderness.     Cervical back: Normal range of motion.  Skin:    General: Skin is warm and dry.     Capillary Refill: Capillary refill takes less than 2 seconds.  Neurological:     Mental Status: He is alert. Mental status is at baseline.     Cranial Nerves: Cranial nerves are intact.     Sensory: Sensation is intact.     Motor: Weakness and atrophy (Marked thenar muscle atrophy) present. No seizure activity.     Coordination: Coordination abnormal.     Deep Tendon Reflexes:     Reflex Scores:      Brachioradialis reflexes are 2+ on the right side and 2+ on the left side.      Patellar reflexes are 2+ on the right side and 2+ on the left side.    Comments: Baseline 4+/5 strength on the right and 5/5 on the left Reflexes 2+ bilaterally     Psychiatric:        Mood and Affect: Mood normal.        Thought Content: Thought content normal.    EKG: personally reviewed my interpretation is NSR w/ a PVC, no ST elevation or depression  CT Head: IMPRESSION: Negative for acute infarct or hemorrhage. Atrophy and extensive chronic ischemic change 2.5 cm calcified meningioma left anterior clinoid, unchanged from prior  studies. These results were called by telephone at the time of interpretation on 08/11/2019 at 4:06 pm to provider Eulis Foster, who verbally acknowledged these results.  MRI Brain: IMPRESSION: Negative for acute infarct. Extensive chronic ischemic changes as above. Numerous areas of chronic microhemorrhage. Findings suggestive of poorly controlled hypertension and possible other causes of microvascular ischemia. Calcified meningioma left anterior clinoid process unchanged from prior studies.  Assessment & Plan by Problem: Principal Problem:   Ataxia, late effect of cerebrovascular disease Active Problems:   HTN (hypertension)   Hearing loss   Meningioma (HCC)   Physical deconditioning   CKD (chronic kidney disease) stage 3, GFR 30-59 ml/min (HCC)   Vascular dementia (HCC)   Chronic diastolic (congestive) heart failure (HCC)  Summary: [name] is a 86 yo male w/ a PMHx notable for HTN, DVT, CKDIII, HLD, meningioma, CVA now off anticoagulation due to GI bleed, and anemia who presented for a change in his walking pattern initially concerning for CVA. MRI demonstrated extensive chronic ischemic changes and numerous areas of chronic microhemorrhage and his stable calcified meinigioma but no acute findings noted. Admission was requested as he could not walk, although he uses a wheelchair at least half of the time.   Assessment and Plan: Ataxia, 2/2 to CVA: Physical deconditioning: Vascular dementia: Based on imaging he appears to have diffuse ischemic changes which in the setting of severe dementia is consistent with a  vascular process. He has baseline ataxia from a prior larger CVA and physical deconditioning with notable thenar muscle atrophy bilaterally. He does have 4-5/5 strength of the proximal muscles in the upper extremities with 5/5 left, and almost 5/5 on the right. This is the same for the lower extremities. I do not see a clear change from his baseline, his family may benefit from respite care as he requires considerable assistance daily. He will likely need more intense physical therapy and may benefit from a SNF placement although this will depend on PT's evaluation.  -PT/OT eval requested -Consider TOC consult -May need to place in inpatient -Tele monitoring overnight, recommend discontinuing in the morning if unremarkable -Fall precautions ordered  CKDIII: GFR stable today at 51 with sCr of 1.39. Monitor while admitted as needed only. Caution nephrotoxic agent use.   Chronic diastolic heart failure: LVEF of 60-65% with mildly increased LV thickness and impaired relaxation of the ventricle consistent with G1DD. Dilated ascending aorta to 4.5cm. No overt signs of volume overload today.  -Continue lasix 80mg  daily  HTN: Patient BP 147/100 most recently. Stable. We will continue his home Amlodipine 5mg .  Hearing Loss: Patient with hearing aide device which appears to improve his ability to communicate although not correct it. He requires close proximity to his left ear and still often misunderstands the conversation making it even more difficult to assess his mental status.  Meningioma: Stable. Follow as an outpatient.  Diet: HH Code: DNR form provided by daughter at bedside DVT PPX: Enoxaparin Fluids: N/A,diet advanced Dispo: Admit patient to Observation with expected length of stay less than 2 midnights.  Signed: Kathi Ludwig, MD [facility] Internal Medicine, PGY-3  Please see Attending A/P and/or Addendum for final recommendations.

## 2019-08-11 NOTE — ED Provider Notes (Signed)
Care transferred to me.  MRI is negative for acute infarct.  However, he does still have difficulty walking, especially without left leg.  Even with a walker and assistance he looked like he was going to fall.  Does not really seem safe to go home.  Possibly has an acute urinary tract infection with moderate leukocytes.  Will admit to internal medicine teaching service. No back pain to suggest spinal cord cause.   Sherwood Gambler, MD 08/11/19 1946

## 2019-08-12 DIAGNOSIS — I69993 Ataxia following unspecified cerebrovascular disease: Secondary | ICD-10-CM

## 2019-08-12 LAB — CBC
HCT: 38.1 % — ABNORMAL LOW (ref 39.0–52.0)
Hemoglobin: 12.5 g/dL — ABNORMAL LOW (ref 13.0–17.0)
MCH: 30.8 pg (ref 26.0–34.0)
MCHC: 32.8 g/dL (ref 30.0–36.0)
MCV: 93.8 fL (ref 80.0–100.0)
Platelets: 163 10*3/uL (ref 150–400)
RBC: 4.06 MIL/uL — ABNORMAL LOW (ref 4.22–5.81)
RDW: 13.6 % (ref 11.5–15.5)
WBC: 7.7 10*3/uL (ref 4.0–10.5)
nRBC: 0 % (ref 0.0–0.2)

## 2019-08-12 LAB — BASIC METABOLIC PANEL
Anion gap: 9 (ref 5–15)
BUN: 23 mg/dL (ref 8–23)
CO2: 29 mmol/L (ref 22–32)
Calcium: 9.7 mg/dL (ref 8.9–10.3)
Chloride: 105 mmol/L (ref 98–111)
Creatinine, Ser: 1.43 mg/dL — ABNORMAL HIGH (ref 0.61–1.24)
GFR calc Af Amer: 49 mL/min — ABNORMAL LOW (ref >60)
GFR calc non Af Amer: 42 mL/min — ABNORMAL LOW (ref >60)
Glucose, Bld: 99 mg/dL (ref 70–99)
Potassium: 3.3 mmol/L — ABNORMAL LOW (ref 3.5–5.1)
Sodium: 143 mmol/L (ref 135–145)

## 2019-08-12 NOTE — NC FL2 (Signed)
[place] LEVEL OF CARE SCREENING TOOL     IDENTIFICATION  Patient Name: [name] Birthdate: 1925-01-31 Sex: male Admission Date (Current Location): 08/11/2019  [facility] and [place] Number:  Herbalist and Address:  [facility], [place] [address] [place], [place] [address]      Provider Number: [id number]  Attending Physician Name and Address:  Aldine Contes, MD  Relative Name and Phone Number:  [name], [phone]    Current Level of Care: Hospital Recommended Level of Care: [place] Prior Approval Number:    Date Approved/Denied:   PASRR Number: [id number]  Discharge Plan: SNF    Current Diagnoses: Patient Active Problem List   Diagnosis Date Noted  . Left leg weakness 08/11/2019  . Right leg swelling 07/07/2019  . Cerebrovascular accident (CVA) ([place]) 04/17/2019  . Cognitive impairment 04/17/2019  . Partial symptomatic epilepsy with complex partial seizures, not intractable, without status epilepticus ([place]) 04/17/2019  . Iron deficiency anemia due to chronic blood loss   . Cerebral embolism with cerebral infarction 11/03/2018  . Incontinence 10/28/2018  . Bradycardia, sinus   . Syncope 10/17/2018  . Chronic diastolic (congestive) heart failure ([place]) 10/28/2017  . Renal cyst 10/28/2017  . Shortness of breath on exertion 10/03/2017  . Generalized weakness 02/19/2017  . Cerebellar ataxia in diseases classified elsewhere ([employer]) 02/13/2017  . Vascular dementia ([place]) 01/28/2016  . Hypernatremia   . UTI (urinary tract infection) 07/17/2014  . Presence of IVC filter 07/17/2014  . Onychomycosis 01/15/2014  . Orthostatic hypotension 07/10/2013  . CKD (chronic kidney disease) stage 3, GFR 30-59 ml/min (HCC) 06/23/2013  . Hyperlipidemia   . Urinary retention 06/15/2013  . Normocytic anemia 06/11/2013  . Physical deconditioning 05/30/2013  . BPH (benign prostatic hyperplasia) 04/24/2013   . Frequent falls 06/23/2012  . 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 Hearing loss 07/20/2011  . Meningioma ([place]) 07/20/2011  . HTN (hypertension) 07/19/2011  . History of CVA (cerebrovascular accident) 07/19/2011    Orientation RESPIRATION BLADDER Height & Weight     Self, Time, Situation, Place  Normal Incontinent, External catheter Weight:   Height:     BEHAVIORAL SYMPTOMS/MOOD NEUROLOGICAL BOWEL NUTRITION STATUS      Incontinent Diet(See discharge Summary)  AMBULATORY STATUS COMMUNICATION OF NEEDS Skin   Extensive Assist Verbally Normal                       Personal Care Assistance Level of Assistance  Bathing, Feeding, Dressing Bathing Assistance: Maximum assistance Feeding assistance: Limited assistance Dressing Assistance: Maximum assistance     Functional Limitations Info  Sight, Hearing, Speech Sight Info: Adequate Hearing Info: Impaired Speech Info: Impaired(Slurred Speech)    SPECIAL CARE FACTORS FREQUENCY  PT (By licensed PT), OT (By licensed OT)     PT Frequency: 5x week OT Frequency: 5x week            Contractures Contractures Info: Not present    Additional Factors Info  Code Status, Allergies Code Status Info: DNR Allergies Info: NKA           Current Medications (08/12/2019):  This is the current hospital active medication list Current Facility-Administered Medications  Medication Dose Route Frequency Provider Last Rate Last Admin  . amLODipine (NORVASC) tablet 5 mg  5 mg Oral Daily Madalyn Rob, MD   5 mg at 08/12/19 0858  . aspirin EC tablet 81 mg  81 mg Oral  Daily Madalyn Rob, MD   81 mg at 08/12/19 0859  . atorvastatin (LIPITOR) tablet 20 mg  20 mg Oral Daily Madalyn Rob, MD   20 mg at 08/12/19 0858  . enoxaparin (LOVENOX) injection 40 mg  40 mg Subcutaneous Q24H Madalyn Rob, MD   40 mg at 08/11/19 2200  . furosemide (LASIX) tablet 80 mg  80 mg Oral Daily Madalyn Rob, MD   80 mg at 08/12/19 0858  .  levETIRAcetam (KEPPRA XR) 24 hr tablet 1,000 mg  1,000 mg Oral QHS Madalyn Rob, MD   1,000 mg at 08/11/19 2218  . sodium chloride flush (NS) 0.9 % injection 3 mL  3 mL Intravenous Once Madalyn Rob, MD      . triamcinolone ointment (KENALOG) 0.1 % 1 application  1 application Topical BID PRN Madalyn Rob, MD         Discharge Medications: Please see discharge summary for a list of discharge medications.  Relevant Imaging Results:  Relevant Lab Results:   Additional Information SS# [place], [place] Work

## 2019-08-12 NOTE — Evaluation (Signed)
Physical Therapy Evaluation Patient Details Name: [name] MRN: [id number] DOB: January 23, 1925 Today's Date: 08/12/2019   History of Present Illness  Pt is a 86 y/o male with PMH signficant for HTN, DVT, CKDIII, meningioma, cerebellar CVA, bil hearing loss (left ear better) now off anticogulation due to GI bleed, and anemia presenting for walking difficulties x 4 days. CT/MRI negative for acute abnormalities. MRI demonstrated extensive chronic ischemic changes and numerous areas of chronic microhemorrhage and his stable calcified meinigioma.   Clinical Impression   Pt admitted with above diagnosis. Admitted due to family reporting increased weakness during gait. No family present during evaluation. Patient was able to transfer to standing from EOB (lowest height) with +2 moderated assist and from [facility] with armrests +1 min assist. Walked 10 ft x 2 (seated rest) with min assist (2nd person for equipment and safety). Patient is very [place] and dysarthric speech with varying answers re: caregiver support and prior functional status. If family cannot provide 24/7 min assist on discharge, will need to consider SNF (cognitively he should not be left alone).  Pt currently with functional limitations due to the deficits listed below (see PT Problem List). Pt will benefit from skilled PT to increase their independence and safety with mobility to allow discharge to the venue listed below.       Follow Up Recommendations Home health PT;Supervision/Assistance - 24 hour(if family cannot provide 24/7 will need to consider SNF)    Equipment Recommendations  None recommended by PT    Recommendations for Other Services       Precautions / Restrictions Precautions Precautions: Fall      Mobility  Bed Mobility Overal bed mobility: Needs Assistance Bed Mobility: Supine to Sit;Sit to Supine     Supine to sit: Supervision;HOB elevated Sit to supine: Supervision;HOB elevated   General bed mobility comments:  incr time and effort to get to sit EOB with feet on the floor, however no assist needed; slight incr time to return to supine  Transfers Overall transfer level: Needs assistance Equipment used: Rolling walker (2 wheeled) Transfers: Sit to/from Stand Sit to Stand: Mod assist;Min assist;+2 physical assistance;+2 safety/equipment         General transfer comment: from EOB, +2 mod assist with pt preferring to pull on anchored RW; from [facility] with armrests min assist of 1; consistent posterior lean   Ambulation/Gait Ambulation/Gait assistance: Min assist Gait Distance (Feet): 10 Feet(seated rest, 10) Assistive device: Rolling walker (2 wheeled) Gait Pattern/deviations: Step-to pattern;Decreased step length - right;Decreased weight shift to left;Trunk flexed;Wide base of support Gait velocity: decr Gait velocity interpretation: <1.31 ft/sec, indicative of household ambulator General Gait Details: walks behind back legs of RW with wide BOS; initially barely clearing each foot and as fatigues, begins to drag/slide rt foot; decr awareness of increasing fatigue and need to sit and rest  Stairs            Wheelchair Mobility    Modified Rankin (Stroke Patients Only)       Balance Overall balance assessment: Needs assistance Sitting-balance support: No upper extremity supported;Feet supported Sitting balance-Leahy Scale: Fair     Standing balance support: Single extremity supported;During functional activity Standing balance-Leahy Scale: Poor Standing balance comment: required single UE support at sink; stood ~2 minutes                              Pertinent Vitals/Pain Pain Assessment: No/denies pain  Home Living Family/patient expects to be discharged to:: Private residence Living Arrangements: Children;Other relatives(daughter, grandson) Available Help at Discharge: Family;Available 24 hours/day Type of Home: House Home Access: Ramped entrance     Home  Layout: One level Home Equipment: Walker - 2 wheels;Bedside commode;Shower seat;Wheelchair - manual;Hospital bed Additional Comments: information from previous record    Prior Function Level of Independence: Needs assistance   Gait / Transfers Assistance Needed: sometimes walks with RW (with assist) vs using wheelchair (per H&P)  ADL's / Homemaking Assistance Needed: could feed himself; A with bathing dressing (per chart)  Comments: Difficulty with vision at baseline     Hand Dominance   Dominant Hand: Right    Extremity/Trunk Assessment   Upper Extremity Assessment Upper Extremity Assessment: Defer to OT evaluation    Lower Extremity Assessment Lower Extremity Assessment: Generalized weakness(RLE fatigues more quickly during gait)    Cervical / Trunk Assessment Cervical / Trunk Assessment: Kyphotic  Communication   Communication: HOH;Expressive difficulties  Cognition Arousal/Alertness: Awake/alert Behavior During Therapy: WFL for tasks assessed/performed Overall Cognitive Status: No family/caregiver present to determine baseline cognitive functioning Area of Impairment: Orientation;Awareness;Problem solving                 Orientation Level: Time         Awareness: Intellectual Problem Solving: Slow processing;Requires verbal cues;Requires tactile cues General Comments: very HOH; frequently contradicted himself when providing history (stays alone during day vs grandson stays with him; walks alone vs always has help)      General Comments General comments (skin integrity, edema, etc.): on 2L O2 on arrival; sats 98%; on RA during activity, sats 92-95%; returned to bed on room air with sats 93% and RN informed    Exercises     Assessment/Plan    PT Assessment Patient needs continued PT services  PT Problem List Decreased strength;Decreased activity tolerance;Decreased balance;Decreased mobility;Decreased cognition;Decreased knowledge of use of  DME;Decreased safety awareness       PT Treatment Interventions DME instruction;Gait training;Functional mobility training;Therapeutic activities;Therapeutic exercise;Balance training;Neuromuscular re-education;Cognitive remediation;Patient/family education    PT Goals (Current goals can be found in the Care Plan section)  Acute Rehab PT Goals Patient Stated Goal: agrees to get stronger and walk PT Goal Formulation: Patient unable to participate in goal setting Time For Goal Achievement: 08/26/19 Potential to Achieve Goals: Good    Frequency Min 3X/week   Barriers to discharge Decreased caregiver support unclear if family can provide level of assist 24/7    Co-evaluation PT/OT/SLP Co-Evaluation/Treatment: Yes Reason for Co-Treatment: Complexity of the patient's impairments (multi-system involvement);For patient/therapist safety;To address functional/ADL transfers PT goals addressed during session: Mobility/safety with mobility;Balance;Proper use of DME         AM-PAC PT "6 Clicks" Mobility  Outcome Measure Help needed turning from your back to your side while in a flat bed without using bedrails?: A Little Help needed moving from lying on your back to sitting on the side of a flat bed without using bedrails?: A Little Help needed moving to and from a bed to a chair (including a wheelchair)?: A Lot Help needed standing up from a chair using your arms (e.g., wheelchair or bedside chair)?: A Little Help needed to walk in hospital room?: A Lot Help needed climbing 3-5 steps with a railing? : Total 6 Click Score: 14    End of Session Equipment Utilized During Treatment: Gait belt Activity Tolerance: Patient limited by fatigue Patient left: in bed;with call bell/phone within reach;with bed alarm  set(no chair alarms available) Nurse Communication: Mobility status PT Visit Diagnosis: Other abnormalities of gait and mobility (R26.89);Muscle weakness (generalized) (M62.81)    Time:  [id number] PT Time Calculation (min) (ACUTE ONLY): 34 min   Charges:   PT Evaluation $PT Eval Moderate Complexity: 1 Mod           Arby Barrette, PT Pager [phone]   Rexanne Mano 08/12/2019, 9:04 AM

## 2019-08-12 NOTE — Evaluation (Signed)
Occupational Therapy Evaluation Patient Details Name: [name] MRN: [id number] DOB: [birth year] Today's Date: 08/12/2019    History of Present Illness Pt is a 86 y/o male with PMH signficant for HTN, DVT, CKDIII, meningioma, cerebellar CVA, bil hearing loss (left ear better) now off anticogulation due to GI bleed, and anemia presenting for walking difficulties x 4 days. CT/MRI negative for acute abnormalities. MRI demonstrated extensive chronic ischemic changes and numerous areas of chronic microhemorrhage and his stable calcified meinigioma.    Clinical Impression   Patient admitted for above and limited by problem list below, including generalized weakness, decreased activity tolerance, impaired balance, and impaired cognition. Patient reports living with his daughter, who works during the day, when his grandson stays with him; but inconsistent historian reporting at times being alone during the day and walking independently vs always having support.  Per chart requires significant assist with ADLs.  Currently requires min-mod assist +2 for transfers, min-total assist +2 for ADLs.  He follow simple commands with increased time, is very HOH and dysarthric speech. Based on performance today, recommend continued OT services while admitted and after dc at [facility] level given 24/7 support, but if family cannot provide 24/7 support (due to cognition) recommend SNF.  Will follow.     Follow Up Recommendations  Home health OT;Supervision/Assistance - 24 hour;SNF(if pt doesnt have 24/7 support with need SNF )    Equipment Recommendations  3 in 1 bedside commode    Recommendations for Other Services       Precautions / Restrictions Precautions Precautions: Fall Restrictions Weight Bearing Restrictions: No      Mobility Bed Mobility Overal bed mobility: Needs Assistance Bed Mobility: Supine to Sit;Sit to Supine     Supine to sit: Supervision;HOB elevated Sit to supine: Supervision;HOB  elevated   General bed mobility comments: incr time and effort to get to sit EOB with feet on the floor, however no assist needed; slight incr time to return to supine  Transfers Overall transfer level: Needs assistance Equipment used: Rolling walker (2 wheeled) Transfers: Sit to/from Stand Sit to Stand: Mod assist;Min assist;+2 physical assistance;+2 safety/equipment         General transfer comment: from EOB, +2 mod assist with pt preferring to pull on anchored RW; from [facility] with armrests min assist of 1; consistent posterior lean     Balance Overall balance assessment: Needs assistance Sitting-balance support: No upper extremity supported;Feet supported Sitting balance-Leahy Scale: Fair     Standing balance support: Single extremity supported;During functional activity Standing balance-Leahy Scale: Poor Standing balance comment: required single UE support at sink; stood ~2 minutes                            ADL either performed or assessed with clinical judgement   ADL Overall ADL's : Needs assistance/impaired     Grooming: Sitting;Minimal assistance;Standing   Upper Body Bathing: Moderate assistance;Sitting   Lower Body Bathing: Maximal assistance;+2 for physical assistance;+2 for safety/equipment;Sit to/from stand   Upper Body Dressing : Maximal assistance;Sitting   Lower Body Dressing: Total assistance;Sit to/from stand;+2 for physical assistance;+2 for safety/equipment   Toilet Transfer: Moderate assistance;Minimal assistance;+2 for physical assistance;+2 for safety/equipment;Ambulation;RW;BSC   Toileting- Clothing Manipulation and Hygiene: Total assistance;+2 for safety/equipment;+2 for physical assistance;Sit to/from stand       Functional mobility during ADLs: Minimal assistance;Moderate assistance;+2 for safety/equipment;+2 for physical assistance;Rolling walker;Cueing for safety;Cueing for sequencing General ADL Comments: pt limited by weakness,  impaired balance, cognition     Vision Baseline Vision/History: Wears glasses Wears Glasses: At all times Additional Comments: impaired vision at baseline      Perception     Praxis      Pertinent Vitals/Pain Pain Assessment: No/denies pain     Hand Dominance Right   Extremity/Trunk Assessment Upper Extremity Assessment Upper Extremity Assessment: Generalized weakness(BUE FF to 90 only)   Lower Extremity Assessment Lower Extremity Assessment: Defer to PT evaluation   Cervical / Trunk Assessment Cervical / Trunk Assessment: Kyphotic   Communication Communication Communication: HOH;Expressive difficulties   Cognition Arousal/Alertness: Awake/alert Behavior During Therapy: WFL for tasks assessed/performed Overall Cognitive Status: No family/caregiver present to determine baseline cognitive functioning Area of Impairment: Orientation;Awareness;Problem solving                 Orientation Level: Time;Disoriented to         Awareness: Intellectual Problem Solving: Slow processing;Requires verbal cues;Requires tactile cues;Difficulty sequencing General Comments: very HOH; frequently contradicted himself when providing history (stays alone during day vs grandson stays with him; walks alone vs always has help); follows simple commands with increased time for processing    General Comments  on 2L O2 on arrival; sats 98%; on RA during activity, sats 92-95%; returned to bed on room air with sats 93% and RN informed    Exercises     Shoulder Instructions      Home Living Family/patient expects to be discharged to:: Private residence Living Arrangements: Children;Other relatives(daughter, grandson) Available Help at Discharge: Family;Available 24 hours/day Type of Home: House Home Access: Ramped entrance     Home Layout: One level     Bathroom Shower/Tub: Tub/shower unit;Curtain   Bathroom Toilet: Standard Bathroom Accessibility: Yes   Home Equipment:  Environmental consultant - 2 wheels;Bedside commode;Shower seat;Wheelchair - manual;Hospital bed   Additional Comments: information from previous record      Prior Functioning/Environment Level of Independence: Needs assistance  Gait / Transfers Assistance Needed: sometimes walks with RW (with assist) vs using wheelchair (per H&P) ADL's / Homemaking Assistance Needed: could feed himself; A with bathing dressing (per chart) Communication / Swallowing Assistance Needed: HOH, dysarthric  Comments: Difficulty with vision at baseline        OT Problem List: Decreased range of motion;Decreased strength;Decreased activity tolerance;Impaired balance (sitting and/or standing);Impaired vision/perception;Decreased coordination;Decreased cognition;Decreased safety awareness;Decreased knowledge of use of DME or AE;Decreased knowledge of precautions      OT Treatment/Interventions: Self-care/ADL training;DME and/or AE instruction;Therapeutic exercise;Therapeutic activities;Patient/family education;Balance training    OT Goals(Current goals can be found in the care plan section) Acute Rehab OT Goals Patient Stated Goal: agrees to get stronger and walk OT Goal Formulation: With patient Time For Goal Achievement: 08/26/19 Potential to Achieve Goals: Good  OT Frequency: Min 2X/week   Barriers to D/C:            Co-evaluation PT/OT/SLP Co-Evaluation/Treatment: Yes Reason for Co-Treatment: Complexity of the patient's impairments (multi-system involvement);For patient/therapist safety;To address functional/ADL transfers;Necessary to address cognition/behavior during functional activity PT goals addressed during session: Mobility/safety with mobility;Balance;Proper use of DME OT goals addressed during session: ADL's and self-care      AM-PAC OT "6 Clicks" Daily Activity     Outcome Measure Help from another person eating meals?: A Little Help from another person taking care of personal grooming?: A Little Help  from another person toileting, which includes using toliet, bedpan, or urinal?: Total Help from another person bathing (including washing, rinsing, drying)?: A Lot Help from another  person to put on and taking off regular upper body clothing?: A Lot Help from another person to put on and taking off regular lower body clothing?: Total 6 Click Score: 12   End of Session Equipment Utilized During Treatment: Gait belt;Rolling walker Nurse Communication: Mobility status  Activity Tolerance: Patient tolerated treatment well Patient left: with call bell/phone within reach;in bed;with bed alarm set  OT Visit Diagnosis: Other abnormalities of gait and mobility (R26.89);Muscle weakness (generalized) (M62.81);Low vision, both eyes (H54.2)                Time: [id number] OT Time Calculation (min): 34 min Charges:  OT General Charges $OT Visit: 1 Visit OT Evaluation $OT Eval Moderate Complexity: 1 Mod  Jolaine Artist, OT Acute Rehabilitation Services Pager [phone] Office [phone]   Delight Stare 08/12/2019, 10:43 AM

## 2019-08-12 NOTE — TOC Initial Note (Addendum)
Transition of Care [facility]) - Initial/Assessment Note    Patient Details  Name: [name] MRN: [id number] Date of Birth: 1924-08-24  Transition of Care [facility]) CM/SW Contact:    Kirstie Peri, [place] Work Phone Number: 08/12/2019, 12:28 PM  Clinical Narrative:                 MSW Intern noted that [facility] was being recommended for this pt and called pt's daughter who is his primary care giver. She advised that they have been using [facility], but would like to see if they could get another company due to scheduling conflicts. MSW Intern called several companies, all were unable to take him due to insurance and staffing issues. [facility] was contacted to resume services.  MSW Intern spoke with [name] again and she advised that after speaking with Dr. She would like to pursue rehab for the pt. She stated that she would talk to pt about it. She said that [facility] was not an option, but liked [name]. FL2 and workup done, [facility] updated that pt will not need services for the moment. Will follow back up with pt's daughter with bed choices.  Addendum 08/12/19 1:18 Insurance started, clinicals faxed to [facility]. Pt's family has chosen bed at [facility]. Ronney Lion has been notified. Pt has had both Pfizer vaccines. Expected Discharge Plan: Skilled Nursing Facility Barriers to Discharge: SNF Pending bed offer, Unsafe home situation, Continued Medical Work up   Patient Goals and CMS Choice Patient states their goals for this hospitalization and ongoing recovery are:: Pt's daughter would like SNF due to safety concerns. CMS Medicare.gov Compare Post Acute Care list provided to:: Patient Represenative (must comment)(daughter, [name]) Choice offered to / list presented to : Adult Children  Expected Discharge Plan and Services Expected Discharge Plan: [place] In-house Referral: Clinical Social Work   Post Acute Care Choice: [place] Living arrangements for the past  2 months: [place]                                      Prior Living Arrangements/Services Living arrangements for the past 2 months: Single Family Home Lives with:: Adult Children Patient language and need for interpreter reviewed:: Yes Do you feel safe going back to the place where you live?: No   Pt is not at baseline and daughter is concerned for hers and the pt's safety with caregiving.  Need for Family Participation in Patient Care: Yes (Comment) Care giver support system in place?: Yes (comment) Current home services: DME, Home OT, Home PT Criminal Activity/Legal Involvement Pertinent to Current Situation/Hospitalization: No - Comment as needed  Activities of Daily Living      Permission Sought/Granted Permission sought to share information with : Family Supports Permission granted to share information with : Yes, Verbal Permission Granted  Share Information with NAME: [name]     Permission granted to share info w Relationship: Daughter  Permission granted to share info w Contact Information: [phone]  Emotional Assessment Appearance:: Other (Comment Required(Unable to Assess) Attitude/Demeanor/Rapport: Unable to Assess Affect (typically observed): Unable to Assess Orientation: : Oriented to Self, Oriented to Place, Oriented to  Time, Oriented to Situation Alcohol / Substance Use: Not Applicable Psych Involvement: No (comment)  Admission diagnosis:  Left leg weakness [R29.898] Left-sided weakness [R53.1] Patient Active Problem List   Diagnosis Date Noted  . Left leg weakness 08/11/2019  .  Right leg swelling 07/07/2019  . Cerebrovascular accident (CVA) ([place]) 04/17/2019  . Cognitive impairment 04/17/2019  . Partial symptomatic epilepsy with complex partial seizures, not intractable, without status epilepticus ([place]) 04/17/2019  . Iron deficiency anemia due to chronic blood loss   . Cerebral embolism with cerebral infarction 11/03/2018  .  Incontinence 10/28/2018  . Bradycardia, sinus   . Syncope 10/17/2018  . Chronic diastolic (congestive) heart failure ([place]) 10/28/2017  . Renal cyst 10/28/2017  . Shortness of breath on exertion 10/03/2017  . Generalized weakness 02/19/2017  . Cerebellar ataxia in diseases classified elsewhere ([place]) 02/13/2017  . Vascular dementia ([place]) 01/28/2016  . Hypernatremia   . UTI (urinary tract infection) 07/17/2014  . Presence of IVC filter 07/17/2014  . Onychomycosis 01/15/2014  . Orthostatic hypotension 07/10/2013  . CKD (chronic kidney disease) stage 3, GFR 30-59 ml/min (HCC) 06/23/2013  . Hyperlipidemia   . Urinary retention 06/15/2013  . Normocytic anemia 06/11/2013  . Physical deconditioning 05/30/2013  . BPH (benign prostatic hyperplasia) 04/24/2013  . Frequent falls 06/23/2012  . 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 Hearing loss 07/20/2011  . Meningioma ([place]) 07/20/2011  . HTN (hypertension) 07/19/2011  . History of CVA (cerebrovascular accident) 07/19/2011   PCP:  [name], DO Pharmacy:   [facility] [place], [place] [address] Phone: [phone] Fax: [place], [facility] [address] [place] [address] [place] [address] Phone: [phone] Fax: [phone]     Social Determinants of Health ([place]) Interventions    Readmission Risk Interventions No flowsheet data found.

## 2019-08-12 NOTE — Progress Notes (Signed)
HD#0 Subjective:  Overnight Events: admitted overnight  [name] is resting comfortably in bed. Patient is alert and oriented to himself and present events. I spoke to the patient's daughter and she states that she agrees that he needs PT as she has noticed a decline in his functional status at home.   Objective:  Vital signs in last 24 hours: Vitals:   08/11/19 2134 08/11/19 2349 08/12/19 0409 08/12/19 0850  BP: (!) 147/100 140/89 135/78 114/77  Pulse: 96 82 72 83  Resp: (!) 24 (!) 24 (!) 25 19  Temp: 98 F (36.7 C) 98.2 F (36.8 C) 98.3 F (36.8 C) 98.1 F (36.7 C)  TempSrc: Oral Oral Oral Oral  SpO2: 95% 96% 95% 91%   Supplemental O2: [place] SpO2: 91 % O2 Flow Rate (L/min): 2 L/min   Physical Exam:  Physical Exam  Constitutional: He is oriented to person, place, and time and well-developed, well-nourished, and in no distress.  HENT:  Head: Normocephalic and atraumatic.  Eyes: EOM are normal.  Cardiovascular: Normal rate and intact distal pulses.  Pulmonary/Chest: Effort normal and breath sounds normal. No respiratory distress.  Abdominal: Soft. Bowel sounds are normal. He exhibits no distension. There is no abdominal tenderness.  Musculoskeletal:        General: Edema (minimal) present. No tenderness. Normal range of motion.  Neurological: He is alert and oriented to person, place, and time.  Good strength (4-5/5) in all 4 extremities  Skin: Skin is warm and dry.    There were no vitals filed for this visit.   Intake/Output Summary (Last 24 hours) at 08/12/2019 1202 Last data filed at 08/12/2019 0416 Gross per 24 hour  Intake --  Output 700 ml  Net -700 ml   Net IO Since Admission: -700 mL [08/12/19 1202]  Pertinent Labs: CBC Latest Ref Rng & Units 08/12/2019 08/11/2019 04/13/2019  WBC 4.0 - 10.5 K/uL 7.7 8.2 -  Hemoglobin 13.0 - 17.0 g/dL 12.5(L) 13.4 13.9  Hematocrit 39.0 - 52.0 % 38.1(L) 43.0 41.0  Platelets 150 - 400 K/uL 163 168 -    CMP Latest  Ref Rng & Units 08/12/2019 08/11/2019 04/13/2019  Glucose 70 - 99 mg/dL 99 111(H) 98  BUN 8 - 23 mg/dL 23 22 30(H)  Creatinine 0.61 - 1.24 mg/dL 1.43(H) 1.39(H) 1.50(H)  Sodium 135 - 145 mmol/L 143 143 145  Potassium 3.5 - 5.1 mmol/L 3.3(L) 4.0 4.0  Chloride 98 - 111 mmol/L 105 105 108  CO2 22 - 32 mmol/L 29 28 -  Calcium 8.9 - 10.3 mg/dL 9.7 10.0 -  Total Protein 6.5 - 8.1 g/dL - 7.4 -  Total Bilirubin 0.3 - 1.2 mg/dL - 0.6 -  Alkaline Phos 38 - 126 U/L - 52 -  AST 15 - 41 U/L - 18 -  ALT 0 - 44 U/L - 11 -    Pending Labs: none  Imaging: none  Assessment/Plan:   Principal Problem:   Ataxia, late effect of cerebrovascular disease Active Problems:   HTN (hypertension)   Hearing loss   Meningioma (HCC)   Physical deconditioning   CKD (chronic kidney disease) stage 3, GFR 30-59 ml/min (HCC)   Vascular dementia (HCC)   Chronic diastolic (congestive) heart failure (HCC)    Patient Summary: [name] is a 86 yo male w/ a PMHx notable for HTN, DVT, CKDIII, HLD, meningioma, CVA now off anticoagulation due to GI bleed, and anemia who presented for a change in his walking pattern  initially concerning for CVA. MRI demonstrated extensive chronic ischemic changes and numerous areas of chronic microhemorrhage and his stable calcified meinigioma but no acute findings noted. Admission was requested as he could not walk, although he uses a wheelchair at least half of the time.    Ataxia, 2/2 to CVA: Physical deconditioning: Vascular dementia: - PT/OT evaluation and recommend SNF placement  - TOC working on placement  -Stroke work-up was negative, no signs of infection, patient is urinating well and has good appetite. -Patient is stable for SNF placement and discharge at this time.  CKDIII: -Stable renal function  Chronic diastolic heart failure: -Minimal signs of hypervolemia with some mild lower extremity swelling -Continue home dose of Lasix 80 mg daily  HTN: -Continue  amlodipine 5 mg daily   Diet: Heart Healthy IVF: None,None VTE: Enoxaparin Code: DNR PT/OT recs: SNF for Subacute PT TOC recs: [place] pending   Dispo: Anticipated discharge once SNF placement has been secured.  He is stable for discharge at this time.Marland Kitchen    Marianna Payment, D.O. [facility], PGY-1 Date 08/12/2019 Time 12:02 PM Pager: [phone]

## 2019-08-12 NOTE — Progress Notes (Signed)
[facility]--The home-based Palliative Care Division of [facility]--Pt is currently enrolled in services since Aug 2020 for his CHF and other co morbidities.  The [facility] SN and SW visit pt at home for support, monitoring and education.  Pt's DTR [name] is his primary CG.  Per [facility] records, pt does have the following DME in the home:   Walker, Wheelchair, BSC, OBT and  [employer] supplies--Hospital Bed and Portable oxygen.  Will plan to resume [place] when pt returns home.  Please call [facility] if we can be of assistance with d/c plan.  Office # -[phone] # [phone].  Thank Macie Burows, RN

## 2019-08-13 DIAGNOSIS — I69993 Ataxia following unspecified cerebrovascular disease: Secondary | ICD-10-CM | POA: Diagnosis not present

## 2019-08-13 NOTE — TOC Transition Note (Signed)
Transition of Care [facility]) - CM/SW Discharge Note   Patient Details  Name: [name] MRN: [id number] Date of Birth: 10-13-1924  Transition of Care [facility]) CM/SW Contact:  Geralynn Ochs, LCSW Phone Number: 08/13/2019, 11:56 AM   Clinical Narrative:   Nurse to call report to [phone], Room 606P.    Final next level of care: Skilled Nursing Facility Barriers to Discharge: Barriers Resolved   Patient Goals and CMS Choice Patient states their goals for this hospitalization and ongoing recovery are:: Pt's daughter would like SNF due to safety concerns. CMS Medicare.gov Compare Post Acute Care list provided to:: Patient Represenative (must comment)(daughter, [name]) Choice offered to / list presented to : Adult Children  Discharge Placement              Patient [facility] Patient to be transferred to facility by: [place] Name of family member notified: [name] Patient and family notified of of transfer: 08/13/19  Discharge Plan and Services In-house Referral: Clinical Social Work   Post Acute Care Choice: [place]                               Social Determinants of Health (SDOH) Interventions     Readmission Risk Interventions No flowsheet data found.

## 2019-08-13 NOTE — Discharge Summary (Signed)
Name: [name] MRN: [id number] DOB: 1924/06/30 86 y.o. PCP: [name], DO  Date of Admission: 08/11/2019 12:51 PM Date of Discharge:  Attending Physician: Aldine Contes, MD  Discharge Diagnosis: 1. Vascular Dementia 2. CVA with persistent ataxia 3. CKDIII 4. HFpEF 5. HTN 6. Hearing loss 7. Meningioma  Discharge Medications: Allergies as of 08/13/2019   No Known Allergies     Medication List    TAKE these medications   amLODipine 5 MG tablet Commonly known as: NORVASC Take 1 tablet (5 mg total) by mouth daily.   aspirin 81 MG EC tablet Take 1 tablet (81 mg total) by mouth daily.   atorvastatin 20 MG tablet Commonly known as: LIPITOR Take 1 tablet (20 mg total) by mouth daily. What changed: when to take this   furosemide 40 MG tablet Commonly known as: LASIX Take 2 tablets (80 mg total) by mouth daily.   levETIRAcetam 500 MG 24 hr tablet Commonly known as: KEPPRA XR Take 2 tablets (1,000 mg total) by mouth at bedtime.   triamcinolone ointment 0.1 % Commonly known as: KENALOG Apply 1 application topically 2 (two) times daily as needed (For leg rash).       Disposition and follow-up:   [name] was discharged from [facility] in Stable condition.  At the hospital follow up visit please address:  1.  Follow-up:  A. CKD - follow up on kidney function  B. Vascular dementia - please re-evaluate long term care needs.  2.  Labs / imaging needed at time of follow-up: BMP  3.  Pending labs/ test needing follow-up: none  Follow-up Appointments: Contact information for after-discharge care    Destination    [facility] Preferred SNF .   Service: Skilled Nursing Contact information: [place] [facility] Course by problem list: [name] is a 86 yo male w/ a PMHx notable for HTN, DVT, CKDIII, HLD, meningioma, CVA now off anticoagulation  due to GI bleed, and anemia who presented for a change in his walking pattern initially concerning for CVA. MRI demonstrated extensive chronic ischemic changes and numerous areas of chronic microhemorrhage and his stable calcified meinigioma but no acute findings noted. Admission was requested as he could not walk, although he uses a wheelchair at least half of the time.   No reversible medical causes have been identified during this patient.  Patient was evaluated by PT and determined to need SNF for subacute PT.  Discharge Vitals:   BP 111/72 (BP Location: Right Arm)    Pulse 80    Temp 98.3 F (36.8 C) (Axillary)    Resp 19    SpO2 93%   Pertinent Labs, Studies, and Procedures:  CBC Latest Ref Rng & Units 08/12/2019 08/11/2019 04/13/2019  WBC 4.0 - 10.5 K/uL 7.7 8.2 -  Hemoglobin 13.0 - 17.0 g/dL 12.5(L) 13.4 13.9  Hematocrit 39.0 - 52.0 % 38.1(L) 43.0 41.0  Platelets 150 - 400 K/uL 163 168 -   CMP Latest Ref Rng & Units 08/12/2019 08/11/2019 04/13/2019  Glucose 70 - 99 mg/dL 99 111(H) 98  BUN 8 - 23 mg/dL 23 22 30(H)  Creatinine 0.61 - 1.24 mg/dL 1.43(H) 1.39(H) 1.50(H)  Sodium 135 - 145 mmol/L 143 143 145  Potassium 3.5 - 5.1 mmol/L 3.3(L) 4.0 4.0  Chloride 98 - 111 mmol/L 105 105 108  CO2 22 - 32  mmol/L 29 28 -  Calcium 8.9 - 10.3 mg/dL 9.7 10.0 -  Total Protein 6.5 - 8.1 g/dL - 7.4 -  Total Bilirubin 0.3 - 1.2 mg/dL - 0.6 -  Alkaline Phos 38 - 126 U/L - 52 -  AST 15 - 41 U/L - 18 -  ALT 0 - 44 U/L - 11 -    Discharge Instructions: Discharge Instructions    Diet - low sodium heart healthy   Complete by: As directed    Discharge instructions   Complete by: As directed    Please make a follow up appointment with your PCP and continue taking your home medications.   Increase activity slowly   Complete by: As directed       Signed: Marianna Payment, MD 08/13/2019, 11:27 AM   Pager: [phone]

## 2019-08-13 NOTE — Progress Notes (Signed)
Report given to Blanch Media at [facility]

## 2019-08-13 NOTE — Plan of Care (Signed)
Adequate for discharge.

## 2019-08-13 NOTE — Progress Notes (Signed)
HD#0 Subjective:  Overnight Events: no events overnight  [name] is resting comfortably in bed. HE is eating his breakfast and denies new complaints at this time.   Objective:  Vital signs in last 24 hours: Vitals:   08/12/19 2042 08/12/19 2228 08/12/19 2328 08/13/19 0416  BP: 113/75 108/74 105/78 108/74  Pulse: (!) 104 (!) 102 95 87  Resp: (!) 29 (!) 25 (!) 22 (!) 23  Temp: 98.5 F (36.9 C) 97.9 F (36.6 C) 99.1 F (37.3 C) 98 F (36.7 C)  TempSrc: Oral Oral Oral Oral  SpO2: 90% (!) 88% 93% 92%   Supplemental O2: [place] SpO2: 92 % O2 Flow Rate (L/min): 2 L/min   Physical Exam:  Physical Exam  Constitutional: He is oriented to person, place, and time and well-developed, well-nourished, and in no distress.  HENT:  Head: Normocephalic and atraumatic.  Eyes: EOM are normal.  Cardiovascular: Normal rate and intact distal pulses.  Pulmonary/Chest: Effort normal and breath sounds normal. No respiratory distress.  Abdominal: Soft. Bowel sounds are normal. He exhibits no distension. There is no abdominal tenderness.  Musculoskeletal:        General: No tenderness or edema. Normal range of motion.  Neurological: He is alert and oriented to person, place, and time.  Skin: Skin is warm and dry.    There were no vitals filed for this visit.   Intake/Output Summary (Last 24 hours) at 08/13/2019 0634 Last data filed at 08/13/2019 0419 Gross per 24 hour  Intake --  Output 1225 ml  Net -1225 ml   Net IO Since Admission: -1,925 mL [08/13/19 0634]  Pertinent Labs: CBC Latest Ref Rng & Units 08/12/2019 08/11/2019 04/13/2019  WBC 4.0 - 10.5 K/uL 7.7 8.2 -  Hemoglobin 13.0 - 17.0 g/dL 12.5(L) 13.4 13.9  Hematocrit 39.0 - 52.0 % 38.1(L) 43.0 41.0  Platelets 150 - 400 K/uL 163 168 -    CMP Latest Ref Rng & Units 08/12/2019 08/11/2019 04/13/2019  Glucose 70 - 99 mg/dL 99 111(H) 98  BUN 8 - 23 mg/dL 23 22 30(H)  Creatinine 0.61 - 1.24 mg/dL 1.43(H) 1.39(H) 1.50(H)  Sodium 135 -  145 mmol/L 143 143 145  Potassium 3.5 - 5.1 mmol/L 3.3(L) 4.0 4.0  Chloride 98 - 111 mmol/L 105 105 108  CO2 22 - 32 mmol/L 29 28 -  Calcium 8.9 - 10.3 mg/dL 9.7 10.0 -  Total Protein 6.5 - 8.1 g/dL - 7.4 -  Total Bilirubin 0.3 - 1.2 mg/dL - 0.6 -  Alkaline Phos 38 - 126 U/L - 52 -  AST 15 - 41 U/L - 18 -  ALT 0 - 44 U/L - 11 -     Pending Labs: none  Imaging: No results found.   Assessment/Plan:   Principal Problem:   Ataxia, late effect of cerebrovascular disease Active Problems:   HTN (hypertension)   Hearing loss   Meningioma (HCC)   Physical deconditioning   CKD (chronic kidney disease) stage 3, GFR 30-59 ml/min (HCC)   Vascular dementia (HCC)   Chronic diastolic (congestive) heart failure (HCC)    has a past medical history of Anemia, BPH (benign prostatic hypertrophy), Cerebral embolism with cerebral infarction ([place]) (12/19/2013), Chronic kidney disease, Difficulty hearing, right, DVT (deep venous thrombosis) ([place]), Frequent falls, History of DVT (deep vein thrombosis) (06/10/2013), Hyperlipidemia, Hypertension, Incontinence of urine, Left adrenal mass ([place]) (09/11/2013), Meningioma ([place]), Stroke ([place]), Thrombocytopenia ([place]), and Vitreous hemorrhage ([place]) (12/21/2013).  Patient Summary: [name] is  a 86 yo male w/ a PMHx notable for HTN,DVT, CKDIII, HLD, meningioma, CVA now off anticoagulation due to GI bleed, and anemia who presented for a change in his walking pattern initially concerning for CVA.MRI demonstrated extensive chronic ischemic changes and numerous areas of chronic microhemorrhage and his stable calcified meinigioma but no acute findings noted. Admission was requested as he could not walk, although he uses a wheelchair at least half of the time.  Ataxia, 2/2 to CVA: Physical deconditioning: Vascular dementia: - Patient is at baseline cognitive status - Plan to transition to SNF today fpr subacute PT.    CKDIII: -Stable renal  function  Chronic diastolic heart failure: - Continue home medications   HTN: -Continue amlodipine 5 mg daily  Diet: Normal IVF: None,None VTE: Enoxaparin Code: DNR PT/OT recs: SNF for Subacute PT TOC recs: SNF today   Dispo: Anticipated discharge today.    Marianna Payment, D.O. MCIMTP, PGY-1 Date 08/13/2019 Time 6:34 AM Pager: [phone]

## 2019-08-13 NOTE — Progress Notes (Signed)
Physical Therapy Treatment Patient Details Name: [name] MRN: [id number] DOB: 1924/07/24 Today's Date: 08/13/2019    History of Present Illness Pt is a 86 y/o male with PMH signficant for HTN, DVT, CKDIII, meningioma, cerebellar CVA, bil hearing loss (left ear better) now off anticogulation due to GI bleed, and anemia presenting for walking difficulties x 4 days. CT/MRI negative for acute abnormalities. MRI demonstrated extensive chronic ischemic changes and numerous areas of chronic microhemorrhage and his stable calcified meinigioma.     PT Comments    Today's skilled session continued to focus on mobility progression with pt continuing to need assistance with bed mobility and transfers. Pt continues to need cues for safety with mobility as well. Discharge plan remains appropriate.     Follow Up Recommendations  Home health PT;Supervision/Assistance - 24 hour(if family can not provide 24 hours care consider SNF)     Equipment Recommendations  None recommended by PT       Precautions / Restrictions Precautions Precautions: Fall Restrictions Weight Bearing Restrictions: No    Mobility  Bed Mobility Overal bed mobility: Needs Assistance Bed Mobility: Supine to Sit;Sit to Supine     Supine to sit: Min assist;HOB elevated Sit to supine: Min guard   General bed mobility comments: pt needed increased time and effort for supine to sitting EOB, reaching with right hand to PTA to assist with trunk elevation into sitting. with return to supine needed assistance to clear bed surface with bil LE's. mod assist with pad under pt to scoot toward [place],.  Transfers Overall transfer level: Needs assistance Equipment used: Rolling walker (2 wheeled) Transfers: Sit to/from Stand           General transfer comment: sit<>stand performed 4 reps in session with min to mod assist. cues needed each time for hand placement along with cues/faciliation for anterior weight shifting. Pt with  incontent episode of bowels with 1st stand. Consecutive stands performed for pericare with min guard to min assist for static standing with RW support. on final stand attempted to have pt take side steps toward [facility]. Pt unable to advance foot despite multimodal cues. Upon sitting after this stand attempted to have pt to laterally scoot with out success.      Cognition Arousal/Alertness: Awake/alert Behavior During Therapy: WFL for tasks assessed/performed Overall Cognitive Status: No family/caregiver present to determine baseline cognitive functioning Area of Impairment: Orientation;Awareness;Problem solving                             Problem Solving: Slow processing;Requires verbal cues;Requires tactile cues;Difficulty sequencing General Comments: very HOH; frequently contradicted himself when providing history (stays alone during day vs grandson stays with him; walks alone vs always has help); follows simple commands with increased time for processing       Exercises General Exercises - Lower Extremity Long Arc Quad: AROM;Strengthening;Both;10 reps;Seated Toe Raises: AROM;Strengthening;Both;10 reps;Seated Heel Raises: AROM;Strengthening;Both;10 reps;Seated        Pertinent Vitals/Pain Pain Assessment: No/denies pain           PT Goals (current goals can now be found in the care plan section) Acute Rehab PT Goals Patient Stated Goal: agrees to get stronger and walk PT Goal Formulation: Patient unable to participate in goal setting Time For Goal Achievement: 08/26/19 Potential to Achieve Goals: Good Progress towards PT goals: Progressing toward goals    Frequency    Min 3X/week      PT Plan  Current plan remains appropriate       AM-PAC PT "6 Clicks" Mobility   Outcome Measure  Help needed turning from your back to your side while in a flat bed without using bedrails?: A Little Help needed moving from lying on your back to sitting on the side of a flat  bed without using bedrails?: A Little Help needed moving to and from a bed to a chair (including a wheelchair)?: A Lot Help needed standing up from a chair using your arms (e.g., wheelchair or bedside chair)?: A Little Help needed to walk in hospital room?: A Lot Help needed climbing 3-5 steps with a railing? : Total 6 Click Score: 14    End of Session Equipment Utilized During Treatment: Gait belt Activity Tolerance: Patient tolerated treatment well;Patient limited by fatigue Patient left: in bed;with call bell/phone within reach;with bed alarm set Nurse Communication: Mobility status;Other (comment)(recent bowel movement) PT Visit Diagnosis: Other abnormalities of gait and mobility (R26.89);Muscle weakness (generalized) (M62.81)     Time: [id number] PT Time Calculation (min) (ACUTE ONLY): 32 min  Charges:  $Therapeutic Exercise: 8-22 mins $Therapeutic Activity: 23-37 mins           Willow Ora, PTA, [facility] [employer] [phone] 08/13/19, 9:33 AM  Willow Ora 08/13/2019, 9:30 AM

## 2019-08-14 ENCOUNTER — Encounter: Payer: Self-pay | Admitting: Internal Medicine

## 2019-08-14 LAB — URINE CULTURE: Culture: >=100000 — AB

## 2019-08-14 NOTE — Progress Notes (Signed)
Patients urine culture noted to be positive for Pseudomonas after discharge to [facility] place, asymptomatic throughout admission. Discussed results and sensitivity with [facility]. They state he remains asymptomatic, alert and oriented, pleasant but will evaluate for whether he requires treatment.   Molli Hazard A, DO 08/14/2019, 12:36 PM Pager: [phone]

## 2019-08-21 ENCOUNTER — Encounter: Payer: Medicare Other | Admitting: Internal Medicine

## 2019-09-11 ENCOUNTER — Telehealth: Payer: Self-pay | Admitting: Internal Medicine

## 2019-09-11 NOTE — Telephone Encounter (Signed)
RTC, [name] states she just spoke to [place]. SChaplin, RN,BSN

## 2019-09-11 NOTE — Telephone Encounter (Signed)
PT D/C from [facility] 08/13/2019 by Dr. Marianna Payment. Pt has now been d/c from  [facility] for f/u on 09/16/2019 with Dr. Truman Hayward @ 2:15pm.

## 2019-09-11 NOTE — Telephone Encounter (Signed)
Pt would like to start care again with [facility], VO given, do you agree?

## 2019-09-11 NOTE — Telephone Encounter (Signed)
Pls contact Allison at [place] for VO [phone]

## 2019-09-12 NOTE — Telephone Encounter (Signed)
Transition Care Management Follow-up Telephone Call  Date of discharge and from where: Discharged from the hospital on 5/31 to Rehab; daughter stated he was there for 21 days then discharged home.  How have you been since you were released from the hospital? Daughter stated he' having difficulty standing esp when he first returned home. But stated now it's a little better.  Any questions or concerns? Concern about not standing well and left sided weakness and being off balance.  Items Reviewed:  Did the pt receive and understand the discharge instructions provided? Yes   Medications obtained and verified? Yes  Any new allergies since your discharge? No   Dietary orders reviewed? Yes  Do you have support at home? Yes , daughter stated she is the main caregiver and her sons/ family members do help.  Functional Questionnaire: (I = Independent and D = Dependent) ADLs: Pt needs help. Pt currently has [facility] aide.  Bathing/Dressing- Pt needs help. HH PT/OT.   Meal Prep- Dependent on daughter.  Eating- Independently.  Maintaining continence- Pt needs assistance to bathroom.  Transferring/Ambulation-  Pt needs assistance ; he has walker/w/c. HH PT.  Managing Meds- Dependent on daughter.  Follow up appointments reviewed:   PCP Hospital f/u appt confirmed? Yes  Scheduled to see Dr Truman Hayward on 09/16/19 @ 1415PM.  [facility] f/u appt confirmed? N/A  Are transportation arrangements needed? No per daughter.  If their condition worsens, is the pt aware to call PCP or go to the Emergency Dept.? Yes  Was the patient provided with contact information for the PCP's office or ED? Yes,the daughter was.  Was to pt encouraged to call back with questions or concerns? Yes

## 2019-09-12 NOTE — Telephone Encounter (Signed)
agree

## 2019-09-16 ENCOUNTER — Encounter: Payer: Self-pay | Admitting: Internal Medicine

## 2019-09-16 ENCOUNTER — Other Ambulatory Visit: Payer: Self-pay

## 2019-09-16 ENCOUNTER — Ambulatory Visit (INDEPENDENT_AMBULATORY_CARE_PROVIDER_SITE_OTHER): Payer: Medicare Other | Admitting: Internal Medicine

## 2019-09-16 VITALS — BP 130/81 | HR 73 | Temp 98.2°F | Ht 70.0 in | Wt 204.5 lb

## 2019-09-16 DIAGNOSIS — F0150 Vascular dementia without behavioral disturbance: Secondary | ICD-10-CM

## 2019-09-16 DIAGNOSIS — I110 Hypertensive heart disease with heart failure: Secondary | ICD-10-CM

## 2019-09-16 DIAGNOSIS — I10 Essential (primary) hypertension: Secondary | ICD-10-CM

## 2019-09-16 DIAGNOSIS — I5032 Chronic diastolic (congestive) heart failure: Secondary | ICD-10-CM

## 2019-09-16 DIAGNOSIS — N1831 Chronic kidney disease, stage 3a: Secondary | ICD-10-CM | POA: Diagnosis not present

## 2019-09-16 NOTE — Assessment & Plan Note (Signed)
Has hx of CKD3a at GFR of 50 at baseline. Will check bmp this visit to assess for progression.  - BMP

## 2019-09-16 NOTE — Assessment & Plan Note (Signed)
[name] presents w/ history of vascular dementia. He is AAOx2 and is able to follow directions and answer questions appropriately although somewhat disoriented. His ADLs are mostly performed by himself although dressing and cleaning is limited by his chronic weakness. IADLs are performed by [name], his daughter. They have no acute concerns at this time regarding his ability to care for himself at home.

## 2019-09-16 NOTE — Progress Notes (Addendum)
CC: Weight changes  HPI: JimmyJimmy Arroyo is a 86 y.o. with PMH listed below presenting with complaint of weight changes. Please see problem based assessment and plan for further details.  Past Medical History:  Diagnosis Date  . Anemia   . BPH (benign prostatic hypertrophy)    TURP 05/19/13  . Cerebral embolism with cerebral infarction ([place]) 12/19/2013  . Chronic kidney disease    CHRONIC KIDNEY DISEASE, 2  . Difficulty hearing, right    BILATERAL HEARING LOSS - BEST TO TRY TO SPEAK INTO LEFT EAR  . DVT (deep venous thrombosis) ([place])   . Frequent falls   . History of DVT (deep vein thrombosis) 06/10/2013   Provoked s/p TURP. Dx per doppler 06/10/13. Complicated by hematuria while on anticoagulation s/p TURP. Initially on lovenox and coumadin, then stopped, then IVC filter placed 06/16/13. Anticoagulation restarted after hematuria stopped. Lovenox was discontinued apparently on 07/29/13 with comment on high risk for falls.   Total ~1.5 months of anticoagulation but interrupted with bleeding complication.  . Hyperlipidemia   . Hypertension   . Incontinence of urine    SOME INCONTINENCE  . Left adrenal mass ([place]) 09/11/2013  . Meningioma ([place])   . Stroke ([place])    Cerebellar, 2013; WALKS WITH WALKER, ABLE TO DRESS AND BATHE HIMSELF BUT FAMILY TRIES TO PROVIDE SUPERVISION BECAUSE OF HIS HX OF FALL AND WEAKNESS LEGS, ARMS   . Thrombocytopenia ([place])   . Vitreous hemorrhage ([place]) 12/21/2013   OS 12/18/13 CT /MRI brain done for ataxia    Review of Systems: Review of Systems  Constitutional: Negative for chills, fever and malaise/fatigue.  Eyes: Negative for blurred vision.  Respiratory: Negative for shortness of breath.   Cardiovascular: Negative for chest pain.  Gastrointestinal: Negative for constipation, diarrhea, nausea and vomiting.  Neurological: Negative for dizziness and headaches.  All other systems reviewed and are negative.    Physical Exam: Vitals:   09/16/19 1422  BP:  130/81  Pulse: 73  Temp: 98.2 F (36.8 C)  TempSrc: Oral  Weight: 204 lb 8 oz (92.8 kg)  Height: 5\' 10"  (1.778 m)   Physical Exam Constitutional:      General: He is not in acute distress.    Appearance: He is normal weight.  HENT:     Head: Normocephalic and atraumatic.     Mouth/Throat:     Mouth: Mucous membranes are moist.     Pharynx: Oropharynx is clear.  Eyes:     Conjunctiva/sclera: Conjunctivae normal.  Cardiovascular:     Rate and Rhythm: Normal rate and regular rhythm.     Pulses: Normal pulses.     Heart sounds: Normal heart sounds.  Pulmonary:     Effort: Pulmonary effort is normal.     Breath sounds: Normal breath sounds. No wheezing or rales.  Abdominal:     General: Abdomen is flat. Bowel sounds are normal. There is no distension.     Tenderness: There is no abdominal tenderness.  Musculoskeletal:        General: Swelling (trace ankle pitting edema) present.  Skin:    General: Skin is warm and dry.  Neurological:     Mental Status: He is alert.     Comments: AAOx2  Psychiatric:        Mood and Affect: Mood normal.        Behavior: Behavior normal.      Assessment & Plan:   Chronic diastolic (congestive) heart failure (HCC) JimmyArroyo is a 86  yo M w/ PMH of vascular dementia, HTN, diastolic heart failure and CKD3 presenting to [facility] for post-hospital follow up. He is AAOx2 and unable to provide details on history taking. He was examined and evaluated with his daughter present who provides most of the history. He was recently admitted with lower extremity weakness with concerns for a new stroke. Brain imaging revealed no acute findings and his weakness was attributed to deconditioning and was discharged to a skilled nursing facility. She mentions that since his rehab, he has been able to get his strength back and has returned to being ambulatory with walker. She mentions that she had some confusion regarding his medications at home as he was previously  prescribed furosemide 80mg  daily but was switched to 40mg  daily at the SNF. She mentions that at the time of discharge from the SNF, he was 12 pounds over his dry weight and she had resumed his previously prescribed furosemide 80mg  dose. She also mentions confusion regarding whether he needs to continue his potassium supplementation.  A/p Chronic diastolic heart failure. Currently appears euvolemic. Last known dry weight 200lbs. Currently 204lbs. Discussed with [facility] regarding continuing 80mg  dose for now to reach dry weight and checking bmp this visit to assess electrolytes. Ms.[name] expressed understanding.  - C/w furosemide 80mg  daily - BMP  HTN (hypertension) BP Readings from Last 3 Encounters:  09/16/19 130/81  08/13/19 114/74  07/10/19 132/81   Currently at goal. On amlodipine 5mg  daily. Denies orthostatic symptoms. Has chronic lower extremity edema due to HFpEF although at this visit. Edema is minimal.  - C/w amlodipine 5mg   Vascular dementia (HCC) JimmyArroyo presents w/ history of vascular dementia. He is AAOx2 and is able to follow directions and answer questions appropriately although somewhat disoriented. His ADLs are mostly performed by himself although dressing and cleaning is limited by his chronic weakness. IADLs are performed by [name], his daughter. They have no acute concerns at this time regarding his ability to care for himself at home.  CKD (chronic kidney disease) stage 3, GFR 30-59 ml/min (HCC) Has hx of CKD3a at GFR of 50 at baseline. Will check bmp this visit to assess for progression.  - BMP   Patient discussed with Dr. Jimmye Norman  -Gilberto Better, [facility] Pager: [phone]

## 2019-09-16 NOTE — Assessment & Plan Note (Signed)
BP Readings from Last 3 Encounters:  09/16/19 130/81  08/13/19 114/74  07/10/19 132/81   Currently at goal. On amlodipine 5mg  daily. Denies orthostatic symptoms. Has chronic lower extremity edema due to HFpEF although at this visit. Edema is minimal.  - C/w amlodipine 5mg 

## 2019-09-16 NOTE — Assessment & Plan Note (Signed)
Mr.Dulac is a 86 yo M w/ PMH of vascular dementia, HTN, diastolic heart failure and CKD3 presenting to [facility] for post-hospital follow up. He is AAOx2 and unable to provide details on history taking. He was examined and evaluated with his daughter present who provides most of the history. He was recently admitted with lower extremity weakness with concerns for a new stroke. Brain imaging revealed no acute findings and his weakness was attributed to deconditioning and was discharged to a skilled nursing facility. She mentions that since his rehab, he has been able to get his strength back and has returned to being ambulatory with walker. She mentions that she had some confusion regarding his medications at home as he was previously prescribed furosemide 80mg  daily but was switched to 40mg  daily at the SNF. She mentions that at the time of discharge from the SNF, he was 12 pounds over his dry weight and she had resumed his previously prescribed furosemide 80mg  dose. She also mentions confusion regarding whether he needs to continue his potassium supplementation.  A/p Chronic diastolic heart failure. Currently appears euvolemic. Last known dry weight 200lbs. Currently 204lbs. Discussed with [facility] regarding continuing 80mg  dose for now to reach dry weight and checking bmp this visit to assess electrolytes. Ms.[name] expressed understanding.  - C/w furosemide 80mg  daily - BMP

## 2019-09-16 NOTE — Patient Instructions (Addendum)
Thank you for allowing [place] to provide your care today. Today we discussed your heart failure    I have ordered bmp labs for you. I will call if any are abnormal.    Today we made no changes to your medications.    Please follow-up in 3 months.    Should you have any questions or concerns please call the internal medicine clinic at [phone].

## 2019-09-17 LAB — BMP8+ANION GAP
Anion Gap: 12 mmol/L (ref 10.0–18.0)
BUN/Creatinine Ratio: 17 (ref 10–24)
BUN: 21 mg/dL (ref 10–36)
CO2: 31 mmol/L — ABNORMAL HIGH (ref 20–29)
Calcium: 10.3 mg/dL — ABNORMAL HIGH (ref 8.6–10.2)
Chloride: 101 mmol/L (ref 96–106)
Creatinine, Ser: 1.25 mg/dL (ref 0.76–1.27)
GFR calc Af Amer: 57 mL/min/{1.73_m2} — ABNORMAL LOW (ref >59)
GFR calc non Af Amer: 50 mL/min/{1.73_m2} — ABNORMAL LOW (ref >59)
Glucose: 94 mg/dL (ref 65–99)
Potassium: 4.8 mmol/L (ref 3.5–5.2)
Sodium: 144 mmol/L (ref 134–144)

## 2019-09-18 NOTE — Progress Notes (Signed)
.  attest

## 2019-09-19 NOTE — Progress Notes (Signed)
Internal Medicine Clinic Attending  Case discussed with Dr. Lee  At the time of the visit.  We reviewed the resident's history and exam and pertinent patient test results.  I agree with the assessment, diagnosis, and plan of care documented in the resident's note.    

## 2019-09-25 ENCOUNTER — Telehealth: Payer: Self-pay

## 2019-09-25 NOTE — Telephone Encounter (Signed)
Pt's daughter requesting different home health services, pt do not [facility] to [facility]. Please call pt's daughter back.

## 2019-09-26 NOTE — Telephone Encounter (Signed)
Returned call to [place]. She is happy with [facility] [place] Jeneen Rinks) but not with Aide as the Aide wants to do her routine and not the patient's. She would like to continue with the PT and stop Aide and RN services at this time. Spoke with Joen Laura at [facility]. States HH PT is scheduled to continue to 11/02/2019. She will stop Aide and RN as Therapist, sports is a duplicate service since patient also receives RN with [place]. [name] notified and is very Patent attorney. [name], BSN, RN-BC

## 2019-10-08 ENCOUNTER — Telehealth: Payer: Self-pay

## 2019-10-08 NOTE — Telephone Encounter (Signed)
Call from [facility] - stated daughter concerned pt having slight right sided weakness, leaning more than usual and slightly disoriented. Tracey requesting order for u/a to r/o UTI. Pt denied burning.  Talked to Dr Heber Anderson - verbal order given for U/A and Cx  VO for U/A and Cx given to [facility].

## 2019-10-08 NOTE — Telephone Encounter (Signed)
Tracey with [facility] requesting to speak with a nurse, please call back.

## 2019-10-09 NOTE — Telephone Encounter (Signed)
Did we hear anything back about that U/A?

## 2019-10-10 NOTE — Telephone Encounter (Signed)
OK thank you Lauren, no convincing evidence of UTI.

## 2019-10-10 NOTE — Telephone Encounter (Addendum)
Call placed to [facility] to make her aware. States patient doing better today. [name], RN, BSN

## 2019-10-10 NOTE — Telephone Encounter (Signed)
Received faxed results from [facility] on U/A and culture:   Protein: Trace Leukocyte Esterase: 1+ Nitrite: Negative Bacteria: None seen Hyaline cast: 0-5  Culture: Growth of mixed flora was isolated, suggesting probable contamination. No further testing will be performed.   Results placed in PCP's box. [name], BSN, RN-BC

## 2019-10-10 NOTE — Telephone Encounter (Signed)
I have not seen U/A report yet. I called Tracey, [facility] - no answer; left message to fax result and/or call the office.

## 2019-10-21 ENCOUNTER — Ambulatory Visit: Payer: Medicare Other | Admitting: Neurology

## 2019-10-23 ENCOUNTER — Other Ambulatory Visit: Payer: Self-pay | Admitting: *Deleted

## 2019-10-23 DIAGNOSIS — I5032 Chronic diastolic (congestive) heart failure: Secondary | ICD-10-CM

## 2019-10-23 MED ORDER — AMLODIPINE BESYLATE 5 MG PO TABS: 5.0000 mg | ORAL_TABLET | Freq: Every day | ORAL | 1 refills | Status: DC

## 2019-10-23 MED ORDER — FUROSEMIDE 40 MG PO TABS: 80.0000 mg | ORAL_TABLET | Freq: Every day | ORAL | 1 refills | Status: DC

## 2019-10-23 MED ORDER — ATORVASTATIN CALCIUM 20 MG PO TABS: 20.0000 mg | ORAL_TABLET | Freq: Every day | ORAL | 1 refills | Status: DC

## 2019-10-23 MED ORDER — LEVETIRACETAM ER 500 MG PO TB24: 1000.0000 mg | ORAL_TABLET | Freq: Every day | ORAL | 1 refills | Status: DC

## 2019-11-11 ENCOUNTER — Telehealth: Payer: Self-pay | Admitting: *Deleted

## 2019-11-11 NOTE — Telephone Encounter (Signed)
Pt's daughter called/ stated she had called several weeks ago to have pt's meds refilled and sent to [facility] instead of [facility]. And they have not received any meds and do not want to run out. Meds were refilled and sent to [facility] on 8/12. I called [facility] - I was told since Mr Tappan is a new pt, they need credit card info to have on file, ok to fill meds and other information. Given [phone] to call; which may call 24-7. I called pt's daughter back - informed of the above.

## 2019-11-19 ENCOUNTER — Telehealth: Payer: Self-pay | Admitting: *Deleted

## 2019-11-19 NOTE — Telephone Encounter (Signed)
[name] with [facility] called from patient's home. States patient is having right-sided weakness and leaning to the right. No other symptoms. A & O x 3. Speech is his usual. Denies pain, denies anything is wrong. BP 118/68 P 60 O2Sat 98% on RA R 24. Daughter is concerned. First available clinic appt is not till 11/21/2019 PM. Advised to take patient to [facility] or ED. Daughter and Jimmy Orn are in agreement. [name], BSN, RN-BC

## 2019-12-29 ENCOUNTER — Ambulatory Visit: Payer: Medicare Other | Admitting: Adult Health

## 2020-01-12 ENCOUNTER — Telehealth: Payer: Self-pay

## 2020-01-12 NOTE — Telephone Encounter (Signed)
Pls contact pt and daughter regarding urine, pt contact [phone] Pt is requesting Dr Heber Oden to call

## 2020-01-12 NOTE — Telephone Encounter (Signed)
RTC, Spoke with [place] and asked if RN could assist with anything. She states she wants to make sure she is doing everything she needs to do for her Dad.  Requesting an appt w/ PCP.  States patient has missed a couple of appt's with PCP d/t hospitalization or because he was in assisted living and she feels he needs to be seen by PCP.  Voices no problems with patient.  States patient "lights up when he see's PCP".   PCP has no openings the rest of the year.  [name] states if patient can't see PCP, she can schedule with resident, but would like PCP to "pop in" during visit if PCP is attending. Will forward to Dr. Heber [place] to advise. Thank you, SChaplin, RN,BSN

## 2020-01-12 NOTE — Telephone Encounter (Signed)
Yes that would be fine to schedule him one of the days I am attending in the clinic (which is actually everyday this week)

## 2020-01-15 ENCOUNTER — Other Ambulatory Visit: Payer: Self-pay | Admitting: *Deleted

## 2020-01-15 DIAGNOSIS — I5032 Chronic diastolic (congestive) heart failure: Secondary | ICD-10-CM

## 2020-01-16 MED ORDER — FUROSEMIDE 40 MG PO TABS: 80.0000 mg | ORAL_TABLET | Freq: Every day | ORAL | 1 refills | Status: DC

## 2020-02-02 ENCOUNTER — Telehealth: Payer: Self-pay

## 2020-02-02 NOTE — Telephone Encounter (Signed)
rtc to [place], lm for rtc

## 2020-02-02 NOTE — Telephone Encounter (Signed)
Agree with VO for Flu shot

## 2020-02-02 NOTE — Telephone Encounter (Signed)
Pls contact Al[facility] regarding providing the flu shot for patient [phone]

## 2020-02-02 NOTE — Telephone Encounter (Signed)
[name], pallative care [facility] nurse ask if dr Heber [place] will approve in home flu shot by her. [name] is good giving it but needs a VO for this. Are you agreeable? [name] [phone]

## 2020-02-11 ENCOUNTER — Telehealth: Payer: Self-pay | Admitting: *Deleted

## 2020-02-11 MED ORDER — INFLUENZA VAC SPLIT HIGH-DOSE 0.5 ML IM SUSY: 0.5000 mL | PREFILLED_SYRINGE | Freq: Once | INTRAMUSCULAR | 0 refills | Status: AC

## 2020-02-11 NOTE — Telephone Encounter (Signed)
Sent Rx for Fluzone High Dose.

## 2020-02-11 NOTE — Telephone Encounter (Signed)
Please order flu vaccine to be picked up by family for [facility] nurse to give in home upon her next visit. [place] Will need to go to [address]

## 2020-02-11 NOTE — Telephone Encounter (Signed)
Informed julie

## 2020-03-11 ENCOUNTER — Emergency Department (HOSPITAL_COMMUNITY): Payer: Medicare Other

## 2020-03-11 ENCOUNTER — Emergency Department (HOSPITAL_COMMUNITY)
Admission: EM | Admit: 2020-03-11 | Discharge: 2020-03-11 | Disposition: A | Discharge status: Home / Self Care | Payer: Medicare Other | Attending: Emergency Medicine | Admitting: Emergency Medicine

## 2020-03-11 ENCOUNTER — Encounter (HOSPITAL_COMMUNITY): Payer: Self-pay | Admitting: Emergency Medicine

## 2020-03-11 DIAGNOSIS — R269 Unspecified abnormalities of gait and mobility: Secondary | ICD-10-CM | POA: Diagnosis present

## 2020-03-11 DIAGNOSIS — I130 Hypertensive heart and chronic kidney disease with heart failure and stage 1 through stage 4 chronic kidney disease, or unspecified chronic kidney disease: Secondary | ICD-10-CM | POA: Insufficient documentation

## 2020-03-11 DIAGNOSIS — M6281 Muscle weakness (generalized): Secondary | ICD-10-CM | POA: Insufficient documentation

## 2020-03-11 DIAGNOSIS — I5032 Chronic diastolic (congestive) heart failure: Secondary | ICD-10-CM | POA: Insufficient documentation

## 2020-03-11 DIAGNOSIS — N183 Chronic kidney disease, stage 3 unspecified: Secondary | ICD-10-CM | POA: Insufficient documentation

## 2020-03-11 DIAGNOSIS — Z79899 Other long term (current) drug therapy: Secondary | ICD-10-CM | POA: Diagnosis not present

## 2020-03-11 DIAGNOSIS — Z7982 Long term (current) use of aspirin: Secondary | ICD-10-CM | POA: Diagnosis not present

## 2020-03-11 DIAGNOSIS — R531 Weakness: Secondary | ICD-10-CM

## 2020-03-11 LAB — URINALYSIS, ROUTINE W REFLEX MICROSCOPIC
Bacteria, UA: NONE SEEN
Bilirubin Urine: NEGATIVE
Glucose, UA: NEGATIVE mg/dL
Ketones, ur: NEGATIVE mg/dL
Leukocytes,Ua: NEGATIVE
Nitrite: NEGATIVE
Protein, ur: NEGATIVE mg/dL
Specific Gravity, Urine: 1.006 (ref 1.005–1.030)
pH: 6 (ref 5.0–8.0)

## 2020-03-11 LAB — COMPREHENSIVE METABOLIC PANEL
ALT: 12 U/L (ref 0–44)
AST: 18 U/L (ref 15–41)
Albumin: 3.5 g/dL (ref 3.5–5.0)
Alkaline Phosphatase: 62 U/L (ref 38–126)
Anion gap: 8 (ref 5–15)
BUN: 25 mg/dL — ABNORMAL HIGH (ref 8–23)
CO2: 28 mmol/L (ref 22–32)
Calcium: 10.5 mg/dL — ABNORMAL HIGH (ref 8.9–10.3)
Chloride: 110 mmol/L (ref 98–111)
Creatinine, Ser: 1.65 mg/dL — ABNORMAL HIGH (ref 0.61–1.24)
GFR, Estimated: 38 mL/min — ABNORMAL LOW (ref >60)
Glucose, Bld: 105 mg/dL — ABNORMAL HIGH (ref 70–99)
Potassium: 4.6 mmol/L (ref 3.5–5.1)
Sodium: 146 mmol/L — ABNORMAL HIGH (ref 135–145)
Total Bilirubin: 0.6 mg/dL (ref 0.3–1.2)
Total Protein: 7.1 g/dL (ref 6.5–8.1)

## 2020-03-11 LAB — DIFFERENTIAL
Abs Immature Granulocytes: 0.02 10*3/uL (ref 0.00–0.07)
Basophils Absolute: 0 10*3/uL (ref 0.0–0.1)
Basophils Relative: 0 %
Eosinophils Absolute: 0.3 10*3/uL (ref 0.0–0.5)
Eosinophils Relative: 4 %
Immature Granulocytes: 0 %
Lymphocytes Relative: 17 %
Lymphs Abs: 1.4 10*3/uL (ref 0.7–4.0)
Monocytes Absolute: 0.5 10*3/uL (ref 0.1–1.0)
Monocytes Relative: 7 %
Neutro Abs: 5.9 10*3/uL (ref 1.7–7.7)
Neutrophils Relative %: 72 %

## 2020-03-11 LAB — CBC
HCT: 44.5 % (ref 39.0–52.0)
Hemoglobin: 13.5 g/dL (ref 13.0–17.0)
MCH: 29.6 pg (ref 26.0–34.0)
MCHC: 30.3 g/dL (ref 30.0–36.0)
MCV: 97.6 fL (ref 80.0–100.0)
Platelets: 125 10*3/uL — ABNORMAL LOW (ref 150–400)
RBC: 4.56 MIL/uL (ref 4.22–5.81)
RDW: 13.8 % (ref 11.5–15.5)
WBC: 8.2 10*3/uL (ref 4.0–10.5)
nRBC: 0 % (ref 0.0–0.2)

## 2020-03-11 LAB — PROTIME-INR
INR: 1.1 (ref 0.8–1.2)
Prothrombin Time: 13.3 seconds (ref 11.4–15.2)

## 2020-03-11 LAB — APTT: aPTT: 32 seconds (ref 24–36)

## 2020-03-11 MED ORDER — GADOBUTROL 1 MMOL/ML IV SOLN
9.0000 mL | Freq: Once | INTRAVENOUS | Status: AC | PRN
  Administered 2020-03-11: 9 mL via INTRAVENOUS

## 2020-03-11 MED ORDER — SODIUM CHLORIDE 0.9 % IV BOLUS
500.0000 mL | Freq: Once | INTRAVENOUS | Status: AC
  Administered 2020-03-11: 500 mL via INTRAVENOUS

## 2020-03-11 MED ORDER — CLEVIDIPINE BUTYRATE 0.5 MG/ML IV EMUL: 0.0000 mg/h | INTRAVENOUS | Status: DC

## 2020-03-11 MED ORDER — LABETALOL HCL 5 MG/ML IV SOLN: 10.0000 mg | Freq: Once | INTRAVENOUS | Status: DC

## 2020-03-11 NOTE — ED Notes (Signed)
DC instructions reviewed with the family.  Family verbalized understanding.  Pt DC.

## 2020-03-11 NOTE — ED Notes (Signed)
Pt back from MRI 

## 2020-03-11 NOTE — ED Provider Notes (Signed)
[name] EMERGENCY DEPARTMENT Provider Note   CSN: [id number] Arrival date & time: 03/11/20  1343     History Chief Complaint  Patient presents with  . Gait Problem    [name] is a 86 y.o. male.  Patient with history of stroke, meningioma, DVT who presents the ED with gait imbalance. Slightly worsening gait balance last several days. Uses a walker and wheelchair at times. Mostly has issues with falling over to the right but over the last day or so having more issues with balance leaning more to the left. No new weakness or numbness or speech changes. Family concerned about may be dehydration versus UTI versus new stroke.  The history is provided by the patient and a caregiver.  Neurologic Problem This is a recurrent problem. The current episode started more than 2 days ago. The problem occurs daily. Progression since onset: waxing and waning. Pertinent negatives include no chest pain, no abdominal pain, no headaches and no shortness of breath. Nothing aggravates the symptoms. The treatment provided no relief.       Past Medical History:  Diagnosis Date  . Anemia   . BPH (benign prostatic hypertrophy)    TURP 05/19/13  . Cerebral embolism with cerebral infarction ([place]) 12/19/2013  . Chronic kidney disease    CHRONIC KIDNEY DISEASE, 2  . Difficulty hearing, right    BILATERAL HEARING LOSS - BEST TO TRY TO SPEAK INTO LEFT EAR  . DVT (deep venous thrombosis) ([place])   . Frequent falls   . History of DVT (deep vein thrombosis) 06/10/2013   Provoked s/p TURP. Dx per doppler 06/10/13. Complicated by hematuria while on anticoagulation s/p TURP. Initially on lovenox and coumadin, then stopped, then IVC filter placed 06/16/13. Anticoagulation restarted after hematuria stopped. Lovenox was discontinued apparently on 07/29/13 with comment on high risk for falls.   Total ~1.5 months of anticoagulation but interrupted with bleeding complication.  . Hyperlipidemia   .  Hypertension   . Incontinence of urine    SOME INCONTINENCE  . Left adrenal mass ([place]) 09/11/2013  . Meningioma ([place])   . Stroke ([place])    Cerebellar, 2013; WALKS WITH WALKER, ABLE TO DRESS AND BATHE HIMSELF BUT FAMILY TRIES TO PROVIDE SUPERVISION BECAUSE OF HIS HX OF FALL AND WEAKNESS LEGS, ARMS   . Thrombocytopenia ([place])   . Vitreous hemorrhage ([place]) 12/21/2013   OS 12/18/13 CT /MRI brain done for ataxia     Patient Active Problem List   Diagnosis Date Noted  . Cerebrovascular accident (CVA) ([place]) 04/17/2019  . Partial symptomatic epilepsy with complex partial seizures, not intractable, without status epilepticus ([place]) 04/17/2019  . Chronic diastolic (congestive) heart failure ([place]) 10/28/2017  . Cerebellar ataxia in diseases classified elsewhere ([place]) 02/13/2017  . Vascular dementia ([place]) 01/28/2016  . Presence of IVC filter 07/17/2014  . CKD (chronic kidney disease) stage 3, GFR 30-59 ml/min ([facility]) 06/23/2013  . Hyperlipidemia   . BPH (benign prostatic hyperplasia) 04/24/2013  . Hearing loss 07/20/2011  . Meningioma ([place]) 07/20/2011  . HTN (hypertension) 07/19/2011    Past Surgical History:  Procedure Laterality Date  . CYSTOSCOPY N/A 06/13/2013   Procedure: CYSTOSCOPY FLEXIBLE BEDSIDE;  Surgeon: Ardis Hughs, MD;  Location: [facility];  Service: Urology;  Laterality: N/A;  . MYRINGOTOMY WITH TUBE PLACEMENT Bilateral   . TRANSURETHRAL RESECTION OF PROSTATE N/A 05/19/2013   Procedure: TRANSURETHRAL RESECTION OF THE PROSTATE WITH GYRUS INSTRUMENTS;  Surgeon: Ailene Rud, MD;  Location: [facility];  Service: Urology;  Laterality: N/A;       Family History  Problem Relation Age of Onset  . Diabetes Mother   . Heart disease Mother   . Hypertension Mother   . Heart disease Father   . Hypertension Father   . Hypertension Daughter   . Thyroid disease Daughter     Social History   Tobacco Use  . Smoking status: Never Smoker  . Smokeless tobacco: Never Used  Vaping Use   . Vaping Use: Never used  Substance Use Topics  . Alcohol use: No    Alcohol/week: 0.0 standard drinks  . Drug use: No    Home Medications Prior to Admission medications   Medication Sig Start Date End Date Taking? Authorizing Provider  amLODipine (NORVASC) 5 MG tablet Take 1 tablet (5 mg total) by mouth daily. 10/23/19   [name], DO  aspirin EC 81 MG EC tablet Take 1 tablet (81 mg total) by mouth daily. 11/04/18   Jose Persia, MD  atorvastatin (LIPITOR) 20 MG tablet Take 1 tablet (20 mg total) by mouth daily. 10/23/19   [name], DO  furosemide (LASIX) 40 MG tablet Take 2 tablets (80 mg total) by mouth daily. 01/16/20   [name], DO  levETIRAcetam (KEPPRA XR) 500 MG 24 hr tablet Take 2 tablets (1,000 mg total) by mouth at bedtime. 10/23/19   [name], DO  triamcinolone ointment (KENALOG) 0.1 % Apply 1 application topically 2 (two) times daily as needed (For leg rash). 08/06/19   [name], DO    Allergies    Patient has no known allergies.  Review of Systems   Review of Systems  Constitutional: Negative for chills and fever.  HENT: Negative for ear pain and sore throat.   Eyes: Negative for pain and visual disturbance.  Respiratory: Negative for cough and shortness of breath.   Cardiovascular: Negative for chest pain and palpitations.  Gastrointestinal: Negative for abdominal distention, abdominal pain, diarrhea, nausea and vomiting.  Genitourinary: Negative for dysuria and hematuria.  Musculoskeletal: Positive for gait problem. Negative for arthralgias, back pain, joint swelling, myalgias, neck pain and neck stiffness.  Skin: Negative for color change and rash.  Neurological: Negative for dizziness, tremors, seizures, syncope, facial asymmetry, speech difficulty, weakness, light-headedness, numbness and headaches.  All other systems reviewed and are negative.   Physical Exam Updated Vital Signs  ED Triage Vitals [03/11/20 1401]  Enc Vitals  Group     BP (!) 143/94     Pulse Rate 69     Resp 16     Temp 98.6 F (37 C)     Temp Source Oral     SpO2 95 %     Weight      Height      Head Circumference      Peak Flow      Pain Score      Pain Loc      Pain Edu?      Excl. in [place]?     Physical Exam Vitals and nursing note reviewed.  Constitutional:      General: He is not in acute distress.    Appearance: He is well-developed and well-nourished. He is not ill-appearing.  HENT:     Head: Normocephalic and atraumatic.     Nose: Nose normal.     Mouth/Throat:     Mouth: Mucous membranes are moist.  Eyes:     Extraocular Movements: Extraocular movements intact.  Conjunctiva/sclera: Conjunctivae normal.     Pupils: Pupils are equal, round, and reactive to light.  Cardiovascular:     Rate and Rhythm: Normal rate and regular rhythm.     Pulses: Normal pulses.     Heart sounds: Normal heart sounds. No murmur heard.   Pulmonary:     Effort: Pulmonary effort is normal. No respiratory distress.     Breath sounds: Normal breath sounds.  Abdominal:     Palpations: Abdomen is soft.     Tenderness: There is no abdominal tenderness.  Musculoskeletal:        General: No tenderness or edema. Normal range of motion.     Cervical back: Normal range of motion and neck supple.  Skin:    General: Skin is warm and dry.  Neurological:     General: No focal deficit present.     Mental Status: He is alert.     Cranial Nerves: No cranial nerve deficit.     Sensory: No sensory deficit.     Motor: No weakness.     Coordination: Coordination normal.     Comments: Patient appears to have grossly normal strength and sensation throughout, normal speech, fairly normal finger-to-nose finger, no facial droop and cranial nerves overall appear intact.  Psychiatric:        Mood and Affect: Mood and affect normal.     ED Results / Procedures / Treatments   Labs (all labs ordered are listed, but only abnormal results are  displayed) Labs Reviewed  CBC - Abnormal; Notable for the following components:      Result Value   Platelets 125 (*)    All other components within normal limits  COMPREHENSIVE METABOLIC PANEL - Abnormal; Notable for the following components:   Sodium 146 (*)    Glucose, Bld 105 (*)    BUN 25 (*)    Creatinine, Ser 1.65 (*)    Calcium 10.5 (*)    GFR, Estimated 38 (*)    All other components within normal limits  URINALYSIS, ROUTINE W REFLEX MICROSCOPIC - Abnormal; Notable for the following components:   Color, Urine STRAW (*)    Hgb urine dipstick SMALL (*)    All other components within normal limits  RESP PANEL BY RT-PCR (FLU A&B, COVID) ARPGX2  PROTIME-INR  APTT  DIFFERENTIAL  CBG MONITORING, ED    EKG EKG Interpretation  Date/Time:  Thursday March 11 2020 14:03:07 EST Ventricular Rate:  76 PR Interval:  192 QRS Duration: 114 QT Interval:  390 QTC Calculation: 438 R Axis:   -14 Text Interpretation: Sinus rhythm with Premature atrial complexes Minimal voltage criteria for LVH, may be normal variant ( R in aVL ) Borderline ECG Confirmed by Lennice Sites [id number]) on 03/11/2020 3:55:43 PM Also confirmed by Lennice Sites [id number])  on 03/11/2020 4:32:42 PM   Radiology CT HEAD WO CONTRAST  Result Date: 03/11/2020 CLINICAL DATA:  Difficulty standing and walking. Leans to the right. Symptoms began on Tuesday. EXAM: CT HEAD WITHOUT CONTRAST TECHNIQUE: Contiguous axial images were obtained from the base of the skull through the vertex without intravenous contrast. COMPARISON:  CT head 08/11/2019.  MRI brain 08/10/2019 FINDINGS: Brain: Diffuse cerebral atrophy. Ventricular dilatation consistent with central atrophy. Low-attenuation changes throughout the deep white matter consistent with small vessel ischemia. Focal areas of encephalomalacia in the right frontal and occipital lobes as well as in the right cerebellum. No change in distribution since the previous study consistent  with old infarcts. Calcified meningioma along  the left sphenoid wing is unchanged. No mass-effect or midline shift. No abnormal extra-axial fluid collections. Basal cisterns are not effaced. No acute intracranial hemorrhage. Vascular: Mild intracranial arterial vascular calcifications. Skull: The calvarium appears intact. Sinuses/Orbits: Paranasal sinuses and mastoid air cells are clear. Other: None. IMPRESSION: 1. No acute intracranial abnormalities. 2. Chronic atrophy and small vessel ischemia. 3. Focal areas of encephalomalacia in the right frontal and occipital lobes as well as in the right cerebellum consistent with old infarcts. 4. Calcified meningioma along the left sphenoid wing is unchanged. Electronically Signed   By: Lucienne Capers M.D.   On: 03/11/2020 15:29   MR Brain W and Wo Contrast  Result Date: 03/11/2020 CLINICAL DATA:  Headache.  Intracranial hemorrhage suspected. EXAM: MRI HEAD WITHOUT AND WITH CONTRAST TECHNIQUE: Multiplanar, multiecho pulse sequences of the brain and surrounding structures were obtained without and with intravenous contrast. CONTRAST:  55mL GADAVIST GADOBUTROL 1 MMOL/ML IV SOLN COMPARISON:  Head CT March 11, 2020. FINDINGS: Brain: No acute infarction, hemorrhage, hydrocephalus or extra-axial collection. Large area of encephalomalacia and gliosis involving most of the right cerebellar hemisphere and right middle and superior cerebellar peduncles with volume loss of the pons and medulla. Area of encephalomalacia and gliosis also seen in the bilateral occipital lobes, right greater than left and right frontal lobe. Remote lacunar infarcts in the bilateral cerebellar hemispheres. Scattered hemosiderin deposits are seen within the left cerebellar hemisphere, pons and bilateral occipital lobes as well as right precentral sulcus. Extensive confluent T2 hyperintensity within the white matter of the cerebral hemispheres, nonspecific, most likely related to chronic  microangiopathic changes. Left frontal, partially calcified measuring approximately 2.4 by 1.8 by 2.5 cm with mass effect on the inferior left frontal lobe. No abnormal parenchymal contrast enhancement. Vascular: Normal flow voids. Skull and upper cervical spine: Normal marrow signal. Sinuses/Orbits: Right lens surgery. Trace mucosal thickening of the ethmoid cells. IMPRESSION: 1. No acute intracranial abnormality. 2. Large area of encephalomalacia and gliosis involving most of the right cerebellar hemisphere and right middle and superior cerebellar peduncles with volume loss of the pons and medulla. 3. Areas of encephalomalacia and gliosis in the bilateral occipital lobes, right greater than left and right frontal lobe. Remote lacunar infarcts in the bilateral cerebellar hemispheres. 4. Scattered hemosiderin deposits within the left cerebellar hemisphere, pons and bilateral occipital lobes as well as right precentral sulcus. 5. Advanced chronic white matter disease. Electronically Signed   By: Pedro Earls M.D.   On: 03/11/2020 19:16   DG Chest Portable 1 View  Result Date: 03/11/2020 CLINICAL DATA:  Weakness. EXAM: PORTABLE CHEST 1 VIEW COMPARISON:  Chest x-ray dated April 13, 2019. FINDINGS: The heart size and mediastinal contours are within normal limits. Both lungs are clear. The visualized skeletal structures are unremarkable. IMPRESSION: No active disease. Electronically Signed   By: Titus Dubin M.D.   On: 03/11/2020 16:19    Procedures Procedures (including critical care time)  Medications Ordered in ED Medications  sodium chloride 0.9 % bolus 500 mL (500 mLs Intravenous New Bag/Given 03/11/20 2002)  gadobutrol (GADAVIST) 1 MMOL/ML injection 9 mL (9 mLs Intravenous Contrast Given 03/11/20 1858)    ED Course  I have reviewed the triage vital signs and the nursing notes.  Pertinent labs & imaging results that were available during my care of the patient were reviewed  by me and considered in my medical decision making (see chart for details).    MDM Rules/Calculators/A&P  ROI JAFARI is a 86 year old male with history of frequent falls, meningioma, CKD, stroke who presents the ED with fall/gait problem. Patient with overall unremarkable vitals. No fever. Family concerned about possibly stroke versus dehydration versus UTI. Patient walks with a walker and sometimes a wheelchair. Having some increased issues with walking over the last week. Typically has some issues walking and leaning over to the right side but having some issues with leaning over more to the left side over the last day or so. Otherwise no new neurologic problems. Patient overall appears well. Appears to be grossly neurologically intact. Had a head CT while in the waiting room that was overall unremarkable. Has stable meningioma. There are no new ischemic findings or head bleed. Patient otherwise with no significant anemia or electrolyte abnormality. Creatinine at baseline. Chest x-ray without any signs of infection. Urinalysis negative for infection. Fluid hydration and get an MRI to further rule out stroke or other intracranial process. However overall suspect acute on chronic worsening of patient mobility issues.  MRI shows no stroke.  No UTI.  No chest infection.  Overall suspect patient mostly at his baseline.  Felt better after some IV fluids.  Discharged in ED in good condition.  Recommend follow-up with primary care doctor.  Recommend continued use of walker and wheelchair with fall precautions.  This chart was dictated using voice recognition software.  Despite best efforts to proofread,  errors can occur which can change the documentation meaning.    Final Clinical Impression(s) / ED Diagnoses Final diagnoses:  Generalized weakness    Rx / DC Orders ED Discharge Orders    None       Virgina Norfolk, DO 03/11/20 2033

## 2020-03-11 NOTE — ED Notes (Signed)
Patient transported to MRI 

## 2020-03-11 NOTE — ED Triage Notes (Signed)
pts daughter reports pt has hx of CVA and normally uses a walker but leans to the right, on Tuesday she noticed pt leaning even more to the right while trying to walk and having difficulty standing and walking on his own. She states last night he seemed a little better, this am upon waking and taking him to the restroom he started to stumble and almost fell. She states he is leaning more to the L than the right now. She denies decreased PO intake. Face symmetrical. Hand grips equal. Speech at baseline per daughter.

## 2020-03-11 NOTE — Progress Notes (Signed)
   Pt is active with [facility], the [facility] division of [facility].  He is followed by nurse and SW in the home for his CHF.  Will follow hospital course and plan to resume [facility] services upon d/c home.  Please call [facility] for assistance with d/c planning.  Office # [phone]  Mobile # [phone] Julius Bowels, RN

## 2020-03-19 ENCOUNTER — Inpatient Hospital Stay (HOSPITAL_COMMUNITY)
Admission: EM | Admit: 2020-03-19 | Discharge: 2020-03-26 | DRG: 178 | Disposition: A | Discharge status: Skilled Nursing Facility | Payer: Medicare HMO | Attending: Student in an Organized Health Care Education/Training Program | Admitting: Student in an Organized Health Care Education/Training Program

## 2020-03-19 ENCOUNTER — Emergency Department (HOSPITAL_COMMUNITY): Payer: Medicare HMO

## 2020-03-19 ENCOUNTER — Encounter (HOSPITAL_COMMUNITY): Payer: Self-pay | Admitting: Emergency Medicine

## 2020-03-19 ENCOUNTER — Other Ambulatory Visit: Payer: Self-pay

## 2020-03-19 DIAGNOSIS — H9193 Unspecified hearing loss, bilateral: Secondary | ICD-10-CM | POA: Diagnosis present

## 2020-03-19 DIAGNOSIS — I130 Hypertensive heart and chronic kidney disease with heart failure and stage 1 through stage 4 chronic kidney disease, or unspecified chronic kidney disease: Secondary | ICD-10-CM | POA: Diagnosis present

## 2020-03-19 DIAGNOSIS — Z7982 Long term (current) use of aspirin: Secondary | ICD-10-CM

## 2020-03-19 DIAGNOSIS — Z86718 Personal history of other venous thrombosis and embolism: Secondary | ICD-10-CM

## 2020-03-19 DIAGNOSIS — R32 Unspecified urinary incontinence: Secondary | ICD-10-CM | POA: Diagnosis present

## 2020-03-19 DIAGNOSIS — N179 Acute kidney failure, unspecified: Secondary | ICD-10-CM | POA: Diagnosis present

## 2020-03-19 DIAGNOSIS — Z9181 History of falling: Secondary | ICD-10-CM

## 2020-03-19 DIAGNOSIS — R569 Unspecified convulsions: Secondary | ICD-10-CM | POA: Diagnosis present

## 2020-03-19 DIAGNOSIS — Z79899 Other long term (current) drug therapy: Secondary | ICD-10-CM

## 2020-03-19 DIAGNOSIS — R531 Weakness: Secondary | ICD-10-CM

## 2020-03-19 DIAGNOSIS — Z7401 Bed confinement status: Secondary | ICD-10-CM

## 2020-03-19 DIAGNOSIS — D329 Benign neoplasm of meninges, unspecified: Secondary | ICD-10-CM | POA: Diagnosis present

## 2020-03-19 DIAGNOSIS — Z87440 Personal history of urinary (tract) infections: Secondary | ICD-10-CM

## 2020-03-19 DIAGNOSIS — Z9079 Acquired absence of other genital organ(s): Secondary | ICD-10-CM

## 2020-03-19 DIAGNOSIS — I5032 Chronic diastolic (congestive) heart failure: Secondary | ICD-10-CM | POA: Diagnosis present

## 2020-03-19 DIAGNOSIS — Z974 Presence of external hearing-aid: Secondary | ICD-10-CM

## 2020-03-19 DIAGNOSIS — Z8673 Personal history of transient ischemic attack (TIA), and cerebral infarction without residual deficits: Secondary | ICD-10-CM

## 2020-03-19 DIAGNOSIS — F0150 Vascular dementia without behavioral disturbance: Secondary | ICD-10-CM | POA: Diagnosis present

## 2020-03-19 DIAGNOSIS — E785 Hyperlipidemia, unspecified: Secondary | ICD-10-CM | POA: Diagnosis present

## 2020-03-19 DIAGNOSIS — Z8249 Family history of ischemic heart disease and other diseases of the circulatory system: Secondary | ICD-10-CM

## 2020-03-19 DIAGNOSIS — N183 Chronic kidney disease, stage 3 unspecified: Secondary | ICD-10-CM | POA: Diagnosis present

## 2020-03-19 DIAGNOSIS — U071 COVID-19: Secondary | ICD-10-CM

## 2020-03-19 DIAGNOSIS — Z66 Do not resuscitate: Secondary | ICD-10-CM | POA: Diagnosis present

## 2020-03-19 DIAGNOSIS — I10 Essential (primary) hypertension: Secondary | ICD-10-CM | POA: Diagnosis present

## 2020-03-19 DIAGNOSIS — K5900 Constipation, unspecified: Secondary | ICD-10-CM | POA: Diagnosis present

## 2020-03-19 HISTORY — DX: COVID-19: U07.1

## 2020-03-19 LAB — CBC WITH DIFFERENTIAL/PLATELET
Abs Immature Granulocytes: 0.03 10*3/uL (ref 0.00–0.07)
Basophils Absolute: 0 10*3/uL (ref 0.0–0.1)
Basophils Relative: 0 %
Eosinophils Absolute: 0 10*3/uL (ref 0.0–0.5)
Eosinophils Relative: 1 %
HCT: 41.8 % (ref 39.0–52.0)
Hemoglobin: 12.9 g/dL — ABNORMAL LOW (ref 13.0–17.0)
Immature Granulocytes: 1 %
Lymphocytes Relative: 19 %
Lymphs Abs: 1.2 10*3/uL (ref 0.7–4.0)
MCH: 29.7 pg (ref 26.0–34.0)
MCHC: 30.9 g/dL (ref 30.0–36.0)
MCV: 96.1 fL (ref 80.0–100.0)
Monocytes Absolute: 1.1 10*3/uL — ABNORMAL HIGH (ref 0.1–1.0)
Monocytes Relative: 19 %
Neutro Abs: 3.6 10*3/uL (ref 1.7–7.7)
Neutrophils Relative %: 60 %
Platelets: 121 10*3/uL — ABNORMAL LOW (ref 150–400)
RBC: 4.35 MIL/uL (ref 4.22–5.81)
RDW: 14.2 % (ref 11.5–15.5)
WBC: 6 10*3/uL (ref 4.0–10.5)
nRBC: 0 % (ref 0.0–0.2)

## 2020-03-19 LAB — RESP PANEL BY RT-PCR (FLU A&B, COVID) ARPGX2
Influenza A by PCR: NEGATIVE
Influenza B by PCR: NEGATIVE
SARS Coronavirus 2 by RT PCR: POSITIVE — AB

## 2020-03-19 LAB — BASIC METABOLIC PANEL
Anion gap: 8 (ref 5–15)
BUN: 20 mg/dL (ref 8–23)
CO2: 28 mmol/L (ref 22–32)
Calcium: 9.5 mg/dL (ref 8.9–10.3)
Chloride: 105 mmol/L (ref 98–111)
Creatinine, Ser: 1.45 mg/dL — ABNORMAL HIGH (ref 0.61–1.24)
GFR, Estimated: 45 mL/min — ABNORMAL LOW (ref >60)
Glucose, Bld: 112 mg/dL — ABNORMAL HIGH (ref 70–99)
Potassium: 3.6 mmol/L (ref 3.5–5.1)
Sodium: 141 mmol/L (ref 135–145)

## 2020-03-19 LAB — TROPONIN I (HIGH SENSITIVITY)
Troponin I (High Sensitivity): 40 ng/L — ABNORMAL HIGH (ref <18)
Troponin I (High Sensitivity): 38 ng/L — ABNORMAL HIGH (ref <18)

## 2020-03-19 LAB — BRAIN NATRIURETIC PEPTIDE: B Natriuretic Peptide: 115.3 pg/mL — ABNORMAL HIGH (ref 0.0–100.0)

## 2020-03-19 MED ORDER — HEPARIN SODIUM (PORCINE) 5000 UNIT/ML IJ SOLN
5000.0000 [IU] | Freq: Three times a day (TID) | INTRAMUSCULAR | Status: DC
  Administered 2020-03-19 – 2020-03-25 (×17): 5000 [IU] via SUBCUTANEOUS
  Filled 2020-03-19 (×16): qty 1

## 2020-03-19 MED ORDER — AMLODIPINE BESYLATE 5 MG PO TABS
5.0000 mg | ORAL_TABLET | Freq: Every day | ORAL | Status: DC
Start: 2020-03-19 — End: 2020-03-26
  Administered 2020-03-19 – 2020-03-26 (×8): 5 mg via ORAL
  Filled 2020-03-19 (×8): qty 1

## 2020-03-19 MED ORDER — ENSURE ENLIVE PO LIQD
237.0000 mL | Freq: Two times a day (BID) | ORAL | Status: DC
  Administered 2020-03-20 – 2020-03-26 (×14): 237 mL via ORAL
  Filled 2020-03-19: qty 237

## 2020-03-19 MED ORDER — LEVETIRACETAM ER 500 MG PO TB24
500.0000 mg | ORAL_TABLET | Freq: Every day | ORAL | Status: DC
  Administered 2020-03-19 – 2020-03-25 (×7): 500 mg via ORAL
  Filled 2020-03-19 (×8): qty 1

## 2020-03-19 MED ORDER — POLYETHYLENE GLYCOL 3350 17 G PO PACK: 17.0000 g | PACK | Freq: Every day | ORAL | Status: DC | PRN

## 2020-03-19 MED ORDER — DEXAMETHASONE 6 MG PO TABS
6.0000 mg | ORAL_TABLET | Freq: Every day | ORAL | Status: DC
  Administered 2020-03-19 – 2020-03-21 (×3): 6 mg via ORAL
  Filled 2020-03-19 (×2): qty 1
  Filled 2020-03-19: qty 2

## 2020-03-19 MED ORDER — ACETAMINOPHEN 500 MG PO TABS
500.0000 mg | ORAL_TABLET | Freq: Four times a day (QID) | ORAL | Status: DC | PRN
  Administered 2020-03-20 – 2020-03-21 (×2): 500 mg via ORAL
  Filled 2020-03-19 (×2): qty 1

## 2020-03-19 MED ORDER — ATORVASTATIN CALCIUM 10 MG PO TABS
20.0000 mg | ORAL_TABLET | Freq: Every day | ORAL | Status: DC
  Administered 2020-03-19 – 2020-03-25 (×7): 20 mg via ORAL
  Filled 2020-03-19 (×7): qty 2

## 2020-03-19 NOTE — ED Triage Notes (Signed)
Pt brought to ED by [facility] from home for fever, increase cough and fatigue for the past 2 days, recently exposed by family to covid. Pt is vaccinated. Initial SPO2 89% RA, up to 98% on 2L [place]. BP 160/90, HR 86, CBG 120.

## 2020-03-19 NOTE — ED Provider Notes (Signed)
[facility] EMERGENCY DEPARTMENT Provider Note   CSN: [id number] Arrival date & time: 03/19/20  1346     History CC:  Weakness  [name] is a 87 y.o. male with history of chronic kidney disease, recurrent UTIs, hypertension, hyperlipidemia, presenting to the emergency department with generalized fatigue and weakness.  The patient is very hard of hearing cannot provide reliable history.  History is provided by his daughter who is also Education officer, community.  She reports normally the patient does not have a dementia, and is very mentally sharp.  He says normally the patient is walking on his own.  However for the past 2 weeks he has had excessive fatigue and weakness.  He was seen in the emergency department on 03/11/20, had labs as well as CT scan and subsequent MRI of the brain.  There is no acute infarct noted on the MRI but he could have some chronic findings on the MRI scan.  This was not felt to be the cause of his symptoms.  His UA did not show signs of infection at that time, with negative leuks negative nitrites.  He was given some IV fluids with some mild improvement and discharged home.  His daughter reports that the patient has been essentially bedbound and too weak to walk on his own for the past week.  She reports subjective fevers and chills at home.  She reports cough and fatigue.  She says that her son tested positive for COVID several days ago.  The patient himself has been vaccinated x2 for covid, but not the booster.  HPI     Past Medical History:  Diagnosis Date  . Anemia   . BPH (benign prostatic hypertrophy)    TURP 05/19/13  . Cerebral embolism with cerebral infarction ([place]) 12/19/2013  . Chronic kidney disease    CHRONIC KIDNEY DISEASE, 2  . Difficulty hearing, right    BILATERAL HEARING LOSS - BEST TO TRY TO SPEAK INTO LEFT EAR  . DVT (deep venous thrombosis) ([place])   . Frequent falls   . History of DVT (deep vein thrombosis) 06/10/2013    Provoked s/p TURP. Dx per doppler 06/10/13. Complicated by hematuria while on anticoagulation s/p TURP. Initially on lovenox and coumadin, then stopped, then IVC filter placed 06/16/13. Anticoagulation restarted after hematuria stopped. Lovenox was discontinued apparently on 07/29/13 with comment on high risk for falls.   Total ~1.5 months of anticoagulation but interrupted with bleeding complication.  . Hyperlipidemia   . Hypertension   . Incontinence of urine    SOME INCONTINENCE  . Left adrenal mass ([place]) 09/11/2013  . Meningioma ([place])   . Stroke ([place])    Cerebellar, 2013; WALKS WITH WALKER, ABLE TO DRESS AND BATHE HIMSELF BUT FAMILY TRIES TO PROVIDE SUPERVISION BECAUSE OF HIS HX OF FALL AND WEAKNESS LEGS, ARMS   . Thrombocytopenia ([place])   . Vitreous hemorrhage ([place]) 12/21/2013   OS 12/18/13 CT /MRI brain done for ataxia     Patient Active Problem List   Diagnosis Date Noted  . Cerebrovascular accident (CVA) ([place]) 04/17/2019  . Partial symptomatic epilepsy with complex partial seizures, not intractable, without status epilepticus ([place]) 04/17/2019  . Chronic diastolic (congestive) heart failure ([place]) 10/28/2017  . Cerebellar ataxia in diseases classified elsewhere ([place]) 02/13/2017  . Vascular dementia ([place]) 01/28/2016  . Presence of IVC filter 07/17/2014  . CKD (chronic kidney disease) stage 3, GFR 30-59 ml/min (HCC) 06/23/2013  . Hyperlipidemia   . BPH (benign prostatic hyperplasia)  04/24/2013  . Hearing loss 07/20/2011  . Meningioma ([place]) 07/20/2011  . HTN (hypertension) 07/19/2011    Past Surgical History:  Procedure Laterality Date  . CYSTOSCOPY N/A 06/13/2013   Procedure: CYSTOSCOPY FLEXIBLE BEDSIDE;  Surgeon: Ardis Hughs, MD;  Location: [facility] [facility];  Service: Urology;  Laterality: N/A;  . MYRINGOTOMY WITH TUBE PLACEMENT Bilateral   . TRANSURETHRAL RESECTION OF PROSTATE N/A 05/19/2013   Procedure: TRANSURETHRAL RESECTION OF THE PROSTATE WITH GYRUS INSTRUMENTS;  Surgeon: Ailene Rud, MD;  Location: [facility];  Service: Urology;  Laterality: N/A;       Family History  Problem Relation Age of Onset  . Diabetes Mother   . Heart disease Mother   . Hypertension Mother   . Heart disease Father   . Hypertension Father   . Hypertension Daughter   . Thyroid disease Daughter     Social History   Tobacco Use  . Smoking status: Never Smoker  . Smokeless tobacco: Never Used  Vaping Use  . Vaping Use: Never used  Substance Use Topics  . Alcohol use: No    Alcohol/week: 0.0 standard drinks  . Drug use: No    Home Medications Prior to Admission medications   Medication Sig Start Date End Date Taking? Authorizing Provider  acetaminophen (TYLENOL) 500 MG tablet Take 500 mg by mouth every 6 (six) hours as needed for fever.   Yes [provider]  amLODipine (NORVASC) 5 MG tablet Take 1 tablet (5 mg total) by mouth daily. 10/23/19  Yes [name], DO  aspirin EC 81 MG EC tablet Take 1 tablet (81 mg total) by mouth daily. 11/04/18  Yes Jose Persia, MD  atorvastatin (LIPITOR) 20 MG tablet Take 1 tablet (20 mg total) by mouth daily. Patient taking differently: Take 20 mg by mouth at bedtime. 10/23/19  Yes [name], DO  furosemide (LASIX) 40 MG tablet Take 2 tablets (80 mg total) by mouth daily. Patient taking differently: Take 40 mg by mouth daily. 01/16/20  Yes [name], DO  levETIRAcetam (KEPPRA XR) 500 MG 24 hr tablet Take 2 tablets (1,000 mg total) by mouth at bedtime. Patient taking differently: Take 500 mg by mouth at bedtime. 10/23/19  Yes [name], DO  triamcinolone ointment (KENALOG) 0.1 % Apply 1 application topically 2 (two) times daily as needed (For leg rash). 08/06/19  Yes [name], DO    Allergies    Patient has no known allergies.  Review of Systems   Review of Systems  Unable to perform ROS: Age (level 5 caveat )    Physical Exam Updated Vital Signs BP (!) 160/67   Pulse 77   Temp 98.9 F (37.2 C)  (Oral)   Resp (!) 23   Ht 5\' 10"  (1.778 m)   Wt 92.8 kg   SpO2 98%   BMI 29.36 kg/m   Physical Exam Constitutional:      General: He is not in acute distress.    Comments: Hard of hearing  HENT:     Head: Normocephalic and atraumatic.  Eyes:     Conjunctiva/sclera: Conjunctivae normal.     Pupils: Pupils are equal, round, and reactive to light.  Cardiovascular:     Rate and Rhythm: Normal rate and regular rhythm.  Pulmonary:     Effort: Pulmonary effort is normal. No respiratory distress.  Abdominal:     General: There is no distension.     Tenderness: There is no abdominal tenderness.  Skin:    General: Skin is warm and dry.  Neurological:     General: No focal deficit present.     Mental Status: He is alert. Mental status is at baseline.     ED Results / Procedures / Treatments   Labs (all labs ordered are listed, but only abnormal results are displayed) Labs Reviewed  RESP PANEL BY RT-PCR (FLU A&B, COVID) ARPGX2 - Abnormal; Notable for the following components:      Result Value   SARS Coronavirus 2 by RT PCR POSITIVE (*)    All other components within normal limits  CBC WITH DIFFERENTIAL/PLATELET - Abnormal; Notable for the following components:   Hemoglobin 12.9 (*)    Platelets 121 (*)    Monocytes Absolute 1.1 (*)    All other components within normal limits  BRAIN NATRIURETIC PEPTIDE - Abnormal; Notable for the following components:   B Natriuretic Peptide 115.3 (*)    All other components within normal limits  BASIC METABOLIC PANEL - Abnormal; Notable for the following components:   Glucose, Bld 112 (*)    Creatinine, Ser 1.45 (*)    GFR, Estimated 45 (*)    All other components within normal limits  TROPONIN I (HIGH SENSITIVITY) - Abnormal; Notable for the following components:   Troponin I (High Sensitivity) 40 (*)    All other components within normal limits  URINALYSIS, ROUTINE W REFLEX MICROSCOPIC  TROPONIN I (HIGH SENSITIVITY)    EKG EKG  Interpretation  Date/Time:  Friday March 19 2020 14:41:28 EST Ventricular Rate:  84 PR Interval:    QRS Duration: 122 QT Interval:  382 QTC Calculation: 452 R Axis:   -35 Text Interpretation: Sinus rhythm Ventricular premature complex Prolonged PR interval Nonspecific intraventricular conduction delay No STEMI Confirmed by Octaviano Glow [phone]) on 03/19/2020 2:49:43 PM   Radiology DG Chest Portable 1 View  Result Date: 03/19/2020 CLINICAL DATA:  Per ED Triage Note: "Pt brought to ED by [facility] from home for fever, increase cough and fatigue for the past 2 days, recently exposed by family to covid. Pt is vaccinated."Hx of HTN EXAM: PORTABLE CHEST 1 VIEW COMPARISON:  03/11/2020. FINDINGS: Cardiac silhouette is mildly enlarged. No mediastinal or hilar masses. Lungs are clear.  No pleural effusion or pneumothorax. Skeletal structures are grossly intact. IMPRESSION: No active disease. Electronically Signed   By: Lajean Manes M.D.   On: 03/19/2020 14:38    Procedures .Critical Care Performed by: Wyvonnia Dusky, MD Authorized by: Wyvonnia Dusky, MD   Critical care provider statement:    Critical care time (minutes):  35   Critical care was necessary to treat or prevent imminent or life-threatening deterioration of the following conditions:  Respiratory failure   Critical care was time spent personally by me on the following activities:  Discussions with consultants, evaluation of patient's response to treatment, examination of patient, ordering and performing treatments and interventions, ordering and review of laboratory studies, ordering and review of radiographic studies, pulse oximetry, re-evaluation of patient's condition, obtaining history from patient or surrogate and review of old charts Comments:     Covid hypoxia requiring supplemental oxygen   (including critical care time)  Medications Ordered in ED Medications - No data to display  ED Course  I have reviewed the triage  vital signs and the nursing notes.  Pertinent labs & imaging results that were available during my care of the patient were reviewed by me and considered in my medical decision making (see chart  for details).  This patient complains of weakness, fever, cough. This involves an extensive number of treatment options, and is a complaint that carries with it a high risk of complications and morbidity.  The differential diagnosis includes infection vs covid vs acs vs anemia vs other  He is hypoxic mildly requiring 2L [place] here (89% on room air).  His daughter reports he is not on home O2 and has no significant hx of COPD or smoking.  I ordered, reviewed, and interpreted labs, which included Covid positive, Trop 40, BMP unremarkable (Cr near baseline levels at 1.45), BNP 115 near baseline, Hgb 12.9, WBC 6.0 I ordered medication oxygen for hypoxia I ordered imaging studies which included dg chest I independently visualized and interpreted imaging which showed no acute findings and the monitor tracing which showed regular rate Additional history was obtained from daughter by phone - she is PoA Previous records obtained and reviewed showing prior ED visit  After the interventions stated above, I reevaluated the patient and found hypoxia stable on 2L.  Plan for admission for Covid-19 illness.   Clinical Course as of 03/19/20 1839  Fri Mar 19, 2020  1648 Daughter updated by phone. [MT]  [id number] Signed out to Dr Johnney Killian with plan to admit for covid hypoxic resp failure requiring 2L [place] [MT]    Clinical Course User Index [MT] Corin Formisano, Carola Rhine, MD    Final Clinical Impression(s) / ED Diagnoses Final diagnoses:  None    Rx / DC Orders ED Discharge Orders    None       Wyvonnia Dusky, MD 03/19/20 [phone]

## 2020-03-19 NOTE — H&P (Addendum)
Date: 03/19/2020               Patient Name:  [name] MRN: [id number]  DOB: Dec 27, 1924 Age / Sex: 87 y.o., male   PCP: [name], DO         Medical Service: Internal Medicine Teaching Service         Attending Physician: Dr. Velna Ochs, MD    First Contact: Dr. Edison Simon Pager: [phone]  Second Contact: Dr. Harlow Ohms Pager: [phone]       After Hours (After 5p/  First Contact Pager: [phone]  weekends / holidays): Second Contact Pager: [phone]   Chief Complaint: Generalized fatigue  History of Present Illness:   87 year old male history of CKD 3, CVA, HEpEF, hypertension, hyperlipidemia, recurrent UTI, meningioma, who presented to the ED for 1 week of generalized fatigue.  History were obtained from patient's daughter, [name], via phone  She reports that patient was having fatigue and generalized weakness for 1 week. States that since last Thursday he was not able to stand and walk. Also report disorientation, states that " this is not my father". Patient had a fever of 101.7 last night measured at home. His daughter reports that patient denies chest pain or shortness of breath. He is not on oxygen at home. Also denies diarrhea and endorses constipation. His daughter say that her son was diagnosed with Covid last Wednesday.  Patient is alert, awake and oriented x 3 during evaluation.  States that he is feeling well.  He denies chest pain, shortness of breath and abdominal pain, or dysuria.  Patient was recently seen in the ED on 12/30 for similar symptoms. He had an MRI done which showed no acute CVA. Neuro exam was unremarkable. Creatinine at baseline. Urinalysis negative for infection. He was given fluid and discharged home.  In the ED: Initial oxygen 89% and was put on 2 L. CBC showed normal white count with stable hemoglobin. CMP showed no electrolyte abnormalities with creatinine at baseline. Troponin flat. BNP mildly elevated 115. Chest x-ray is  unremarkable.  Patient O2 sats in the high 90s not on oxygen supplement during evaluation.   Meds:  Current Meds  Medication Sig  . acetaminophen (TYLENOL) 500 MG tablet Take 500 mg by mouth every 6 (six) hours as needed for fever.  Marland Kitchen amLODipine (NORVASC) 5 MG tablet Take 1 tablet (5 mg total) by mouth daily.  Marland Kitchen aspirin EC 81 MG EC tablet Take 1 tablet (81 mg total) by mouth daily.  Marland Kitchen atorvastatin (LIPITOR) 20 MG tablet Take 1 tablet (20 mg total) by mouth daily. (Patient taking differently: Take 20 mg by mouth at bedtime.)  . furosemide (LASIX) 40 MG tablet Take 2 tablets (80 mg total) by mouth daily. (Patient taking differently: Take 40 mg by mouth daily.)  . levETIRAcetam (KEPPRA XR) 500 MG 24 hr tablet Take 2 tablets (1,000 mg total) by mouth at bedtime. (Patient taking differently: Take 500 mg by mouth at bedtime.)  . triamcinolone ointment (KENALOG) 0.1 % Apply 1 application topically 2 (two) times daily as needed (For leg rash).     Allergies: Allergies as of 03/19/2020  . (No Known Allergies)   Past Medical History:  Diagnosis Date  . Anemia   . BPH (benign prostatic hypertrophy)    TURP 05/19/13  . Cerebral embolism with cerebral infarction ([place]) 12/19/2013  . Chronic kidney disease    CHRONIC KIDNEY DISEASE, 2  . Difficulty hearing, right  BILATERAL HEARING LOSS - BEST TO TRY TO SPEAK INTO LEFT EAR  . DVT (deep venous thrombosis) ([place])   . Frequent falls   . History of DVT (deep vein thrombosis) 06/10/2013   Provoked s/p TURP. Dx per doppler 06/10/13. Complicated by hematuria while on anticoagulation s/p TURP. Initially on lovenox and coumadin, then stopped, then IVC filter placed 06/16/13. Anticoagulation restarted after hematuria stopped. Lovenox was discontinued apparently on 07/29/13 with comment on high risk for falls.   Total ~1.5 months of anticoagulation but interrupted with bleeding complication.  . Hyperlipidemia   . Hypertension   . Incontinence of urine    SOME  INCONTINENCE  . Left adrenal mass ([place]) 09/11/2013  . Meningioma ([place])   . Stroke ([place])    Cerebellar, 2013; WALKS WITH WALKER, ABLE TO DRESS AND BATHE HIMSELF BUT FAMILY TRIES TO PROVIDE SUPERVISION BECAUSE OF HIS HX OF FALL AND WEAKNESS LEGS, ARMS   . Thrombocytopenia ([place])   . Vitreous hemorrhage ([place]) 12/21/2013   OS 12/18/13 CT /MRI brain done for ataxia     Family History:  Family History  Problem Relation Age of Onset  . Diabetes Mother   . Heart disease Mother   . Hypertension Mother   . Heart disease Father   . Hypertension Father   . Hypertension Daughter   . Thyroid disease Daughter     Social History:  Social History   Socioeconomic History  . Marital status: Widowed    Spouse name: Not on file  . Number of children: 1  . Years of education: 10th grade  . Highest education level: Not on file  Occupational History  . Occupation: Retired  Tobacco Use  . Smoking status: Never Smoker  . Smokeless tobacco: Never Used  Vaping Use  . Vaping Use: Never used  Substance and Sexual Activity  . Alcohol use: No    Alcohol/week: 0.0 standard drinks  . Drug use: No  . Sexual activity: Never  Other Topics Concern  . Not on file  Social History Narrative   Current Social History 08/04/2019        Patient lives with his daughter, [name] in a home which is 1 story. There are steps up to the entrance with handrails and a ramp which the patient uses.       Patient's method of transportation is via his daughter.      The highest level of education was junior high (8th grade).      The patient is currently retired.      Identified important Relationships as his daughter, [name].       Pets : No       Interests / Fun: He enjoys sitting outside on the porch.       Current Stressors: Can't do what he used to be able to do, per his daughter       Religious / Personal Beliefs: Catholic       [place], RN,BSN             Social Determinants of Health    Financial Resource Strain: Not on file  Food Insecurity: Not on file  Transportation Needs: Not on file  Physical Activity: Not on file  Stress: Not on file  Social Connections: Not on file  Intimate Partner Violence: Not on file    Review of Systems: A complete ROS was negative except as per HPI.   Physical Exam: Blood pressure (!) 141/107, pulse 80, temperature 98.9 F (37.2 C), temperature  source Oral, resp. rate 19, height 5\' 10"  (1.778 m), weight 92.8 kg, SpO2 92 %. Physical Exam Constitutional:      General: He is not in acute distress.    Comments: Patient appears comfortable and in no acute distress.  He is awake, alert and oriented to person time and place.  He is able to answer question appropriately but sometimes mumbling.  HENT:     Head: Normocephalic.  Eyes:     General:        Right eye: No discharge.        Left eye: No discharge.  Cardiovascular:     Rate and Rhythm: Normal rate and regular rhythm.  Pulmonary:     Effort: No respiratory distress.     Breath sounds: Normal breath sounds. No wheezing.  Abdominal:     General: Bowel sounds are normal.     Palpations: Abdomen is soft.     Tenderness: There is no abdominal tenderness. There is no guarding.  Musculoskeletal:     Right lower leg: Edema (Trace) present.     Left lower leg: Edema (Trace) present.  Skin:    General: Skin is warm.  Neurological:     Mental Status: He is alert and oriented to person, place, and time.     Comments: PERRLA Cranial nerves no deficits Normal strength and sensation of all extremities.  Very subtle weakness of right UE, which was consistent with past physical exam.     EKG: personally reviewed my interpretation is sinus rhythm with PVC  CXR: personally reviewed my interpretation is no acute abnormality  Assessment & Plan by Problem: Principal Problem:   COVID-19 Active Problems:   HTN (hypertension)   Chronic diastolic (congestive) heart failure (HCC)    Weakness  87 year old male history of CKD 3, CVA, HEpEF, hypertension, hyperlipidemia, recurrent UTI, meningioma, who presented to the ED for 1 week of generalized fatigue, confusion likely secondary to COVID-19 infection. Patient received 2 shots of vaccines, no booster.  COVID-19 infection Generalized weakness Patient presents with 1 week of generalized weakness and altered mental status in the setting of COVID-19 infection. He is not on oxygen at home and on 2 L here satting well in the mid 90s.  -No remdesivir given none severe symptoms with history of vaccination -Start Decadron until discharge -Continue oxygen, wean oxygen as tolerable -Obtain UA -Patient may benefit from outpatient monoclonal antibody infusion due to his age and comorbidities   Altered mental status History of CVA Possible history of vascular dementia Stable meningioma Patient had a right posterior frontal and occipital cortex CVA on 11/02/2019.  He was also presented to the hospital 3 times this year for similar symptoms of weakness, most recent one was 03/11/20.  All brain imaging were negative for acute infarct.  Patient is alert and oriented x3 on exam.  He can answer questions appropriately but sometimes mumbling.  Patient is hard of hearing which can limit his ability to conversate.  Suspect this is his baseline.  Patient may have underlying dementia , possible vascular dementia.  Given unremarkable neuro exam, no brain imaging indicated at this time.   Heart failure with preserved ejection fraction Hypertension Lipidemia Echocardiogram 09/2018 showed EF 60-65% with impaired LV relaxation. LA size with severe dilated.  Patient is not overloaded on exam. BNP only mildly elevated We will hold Lasix today, and restart tomorrow if creatinine stable. -Resume amlodipine 5 mg daily -Resume atorvastatin 20 mg daily   CKD 3 Creatinine at  baseline. No fluid indicated at this time.  -Holding Lasix today.  -Monitor  creatinine   History of seizure -Resume Keppra 500 mg daily at bedtime   Constipation -Mi-raLAX  Code: DNR DVT: Heparin   Dispo: Admit patient to Observation with expected length of stay less than 2 midnights.  Signed: Gaylan Gerold, DO 03/19/2020, 9:18 PM  Pager: [phone] After 5pm on weekdays and 1pm on weekends: On Call pager: [phone]

## 2020-03-20 DIAGNOSIS — N183 Chronic kidney disease, stage 3 unspecified: Secondary | ICD-10-CM | POA: Diagnosis present

## 2020-03-20 DIAGNOSIS — Z66 Do not resuscitate: Secondary | ICD-10-CM | POA: Diagnosis present

## 2020-03-20 DIAGNOSIS — U071 COVID-19: Principal | ICD-10-CM

## 2020-03-20 DIAGNOSIS — Z974 Presence of external hearing-aid: Secondary | ICD-10-CM | POA: Diagnosis not present

## 2020-03-20 DIAGNOSIS — F0150 Vascular dementia without behavioral disturbance: Secondary | ICD-10-CM | POA: Diagnosis present

## 2020-03-20 DIAGNOSIS — D329 Benign neoplasm of meninges, unspecified: Secondary | ICD-10-CM | POA: Diagnosis present

## 2020-03-20 DIAGNOSIS — K5900 Constipation, unspecified: Secondary | ICD-10-CM | POA: Diagnosis present

## 2020-03-20 DIAGNOSIS — R531 Weakness: Secondary | ICD-10-CM | POA: Diagnosis present

## 2020-03-20 DIAGNOSIS — I5032 Chronic diastolic (congestive) heart failure: Secondary | ICD-10-CM | POA: Diagnosis present

## 2020-03-20 DIAGNOSIS — Z86718 Personal history of other venous thrombosis and embolism: Secondary | ICD-10-CM | POA: Diagnosis not present

## 2020-03-20 DIAGNOSIS — R569 Unspecified convulsions: Secondary | ICD-10-CM | POA: Diagnosis present

## 2020-03-20 DIAGNOSIS — N179 Acute kidney failure, unspecified: Secondary | ICD-10-CM | POA: Diagnosis present

## 2020-03-20 DIAGNOSIS — Z7982 Long term (current) use of aspirin: Secondary | ICD-10-CM | POA: Diagnosis not present

## 2020-03-20 DIAGNOSIS — Z79899 Other long term (current) drug therapy: Secondary | ICD-10-CM | POA: Diagnosis not present

## 2020-03-20 DIAGNOSIS — Z8673 Personal history of transient ischemic attack (TIA), and cerebral infarction without residual deficits: Secondary | ICD-10-CM | POA: Diagnosis not present

## 2020-03-20 DIAGNOSIS — Z8249 Family history of ischemic heart disease and other diseases of the circulatory system: Secondary | ICD-10-CM | POA: Diagnosis not present

## 2020-03-20 DIAGNOSIS — H9193 Unspecified hearing loss, bilateral: Secondary | ICD-10-CM | POA: Diagnosis present

## 2020-03-20 DIAGNOSIS — Z87440 Personal history of urinary (tract) infections: Secondary | ICD-10-CM | POA: Diagnosis not present

## 2020-03-20 DIAGNOSIS — Z9079 Acquired absence of other genital organ(s): Secondary | ICD-10-CM | POA: Diagnosis not present

## 2020-03-20 DIAGNOSIS — E785 Hyperlipidemia, unspecified: Secondary | ICD-10-CM | POA: Diagnosis present

## 2020-03-20 DIAGNOSIS — I130 Hypertensive heart and chronic kidney disease with heart failure and stage 1 through stage 4 chronic kidney disease, or unspecified chronic kidney disease: Secondary | ICD-10-CM | POA: Diagnosis present

## 2020-03-20 DIAGNOSIS — Z7401 Bed confinement status: Secondary | ICD-10-CM | POA: Diagnosis not present

## 2020-03-20 DIAGNOSIS — Z9181 History of falling: Secondary | ICD-10-CM | POA: Diagnosis not present

## 2020-03-20 DIAGNOSIS — R32 Unspecified urinary incontinence: Secondary | ICD-10-CM | POA: Diagnosis present

## 2020-03-20 LAB — COMPREHENSIVE METABOLIC PANEL
ALT: 22 U/L (ref 0–44)
AST: 31 U/L (ref 15–41)
Albumin: 3.2 g/dL — ABNORMAL LOW (ref 3.5–5.0)
Alkaline Phosphatase: 55 U/L (ref 38–126)
Anion gap: 10 (ref 5–15)
BUN: 19 mg/dL (ref 8–23)
CO2: 25 mmol/L (ref 22–32)
Calcium: 9.7 mg/dL (ref 8.9–10.3)
Chloride: 106 mmol/L (ref 98–111)
Creatinine, Ser: 1.29 mg/dL — ABNORMAL HIGH (ref 0.61–1.24)
GFR, Estimated: 52 mL/min — ABNORMAL LOW (ref >60)
Glucose, Bld: 133 mg/dL — ABNORMAL HIGH (ref 70–99)
Potassium: 4.2 mmol/L (ref 3.5–5.1)
Sodium: 141 mmol/L (ref 135–145)
Total Bilirubin: 0.5 mg/dL (ref 0.3–1.2)
Total Protein: 6.8 g/dL (ref 6.5–8.1)

## 2020-03-20 LAB — CBC WITH DIFFERENTIAL/PLATELET
Abs Immature Granulocytes: 0.03 10*3/uL (ref 0.00–0.07)
Basophils Absolute: 0 10*3/uL (ref 0.0–0.1)
Basophils Relative: 0 %
Eosinophils Absolute: 0 10*3/uL (ref 0.0–0.5)
Eosinophils Relative: 0 %
HCT: 42.4 % (ref 39.0–52.0)
Hemoglobin: 13 g/dL (ref 13.0–17.0)
Immature Granulocytes: 1 %
Lymphocytes Relative: 25 %
Lymphs Abs: 1 10*3/uL (ref 0.7–4.0)
MCH: 29.6 pg (ref 26.0–34.0)
MCHC: 30.7 g/dL (ref 30.0–36.0)
MCV: 96.6 fL (ref 80.0–100.0)
Monocytes Absolute: 0.2 10*3/uL (ref 0.1–1.0)
Monocytes Relative: 4 %
Neutro Abs: 2.9 10*3/uL (ref 1.7–7.7)
Neutrophils Relative %: 70 %
Platelets: 106 10*3/uL — ABNORMAL LOW (ref 150–400)
RBC: 4.39 MIL/uL (ref 4.22–5.81)
RDW: 13.9 % (ref 11.5–15.5)
WBC: 4.2 10*3/uL (ref 4.0–10.5)
nRBC: 0 % (ref 0.0–0.2)

## 2020-03-20 LAB — C-REACTIVE PROTEIN: CRP: 2.3 mg/dL — ABNORMAL HIGH (ref <1.0)

## 2020-03-20 MED ORDER — ALBUTEROL SULFATE HFA 108 (90 BASE) MCG/ACT IN AERS
1.0000 | INHALATION_SPRAY | Freq: Four times a day (QID) | RESPIRATORY_TRACT | Status: DC | PRN
  Filled 2020-03-20: qty 6.7

## 2020-03-20 MED ORDER — FUROSEMIDE 40 MG PO TABS
40.0000 mg | ORAL_TABLET | Freq: Every day | ORAL | Status: DC
  Administered 2020-03-20 – 2020-03-26 (×7): 40 mg via ORAL
  Filled 2020-03-20 (×7): qty 1

## 2020-03-20 NOTE — Progress Notes (Signed)
1215pm: Patient sitting up on the chair, oxygen saturation is 92% on room air. No distress noted at this time.

## 2020-03-20 NOTE — Evaluation (Addendum)
Occupational Therapy Evaluation Patient Details Name: [name] MRN: [id number] DOB: 08/26/1924 Today's Date: 03/20/2020    History of Present Illness Pt admitted with 1 week a fatigue, generalized weakness, disorientation and fever. COVID 19 positive. PMH: CKD 3, CVA, CHF, HTN, HLD, recurrent UTI, meningioma, BPH.   Clinical Impression   Pt was ambulating with a RW and assisted for washing his back, for LB bathing and dressing and all IADL. Pt presents with generalized weakness, posterior lean in sitting and standing, poor standing balance and impaired cognition, although difficult to accurately assess due to pt being severely HOH. Pt requires moderate assistance to stand from elevated surface and transfer, 2 person assist for safety with ambulation. He needs set up to total assist for ADL.Pt with Sp02 94% on RA with transfer which remained once seated in chair.  Recommending SNF, daughter is in agreement. Will follow acutely.    Follow Up Recommendations  SNF;Supervision/Assistance - 24 hour    Equipment Recommendations  3 in 1 bedside commode    Recommendations for Other Services       Precautions / Restrictions Precautions Precautions: Fall Restrictions Weight Bearing Restrictions: No      Mobility Bed Mobility Overal bed mobility: Needs Assistance Bed Mobility: Supine to Sit     Supine to sit: +2 for physical assistance;Max assist     General bed mobility comments: multimodal cues to sequence, assist for LEs over EOB, to raise trunk and to advance hips to EOB    Transfers Overall transfer level: Needs assistance Equipment used: Rolling walker (2 wheeled) Transfers: Sit to/from Stand Sit to Stand: Mod assist         General transfer comment: posterior lean, assist to rise and steady    Balance Overall balance assessment: Needs assistance   Sitting balance-Leahy Scale: Poor Sitting balance - Comments: min guard for static sitting Postural control:  Posterior lean Standing balance support: Bilateral upper extremity supported Standing balance-Leahy Scale: Poor Standing balance comment: mod assist, +2 for safety                           ADL either performed or assessed with clinical judgement   ADL Overall ADL's : Needs assistance/impaired Eating/Feeding: Set up;Sitting   Grooming: Set up;Sitting   Upper Body Bathing: Minimal assistance;Sitting   Lower Body Bathing: Total assistance;Sit to/from stand   Upper Body Dressing : Minimal assistance;Sitting   Lower Body Dressing: Total assistance;Sit to/from stand   Toilet Transfer: Minimal assistance;+2 for safety/equipment;Stand-pivot;RW   Toileting- Clothing Manipulation and Hygiene: Total assistance;Sit to/from stand               Vision Baseline Vision/History: Cataracts Patient Visual Report: No change from baseline Additional Comments: decreased visual acuity     Perception     Praxis      Pertinent Vitals/Pain Pain Assessment: No/denies pain     Hand Dominance Right   Extremity/Trunk Assessment Upper Extremity Assessment Upper Extremity Assessment: Generalized weakness (limited shoulder ROM on R)   Lower Extremity Assessment Lower Extremity Assessment: Defer to PT evaluation   Cervical / Trunk Assessment Cervical / Trunk Assessment: Other exceptions Cervical / Trunk Exceptions: weakness   Communication Communication Communication: HOH;Expressive difficulties   Cognition Arousal/Alertness: Awake/alert Behavior During Therapy: WFL for tasks assessed/performed Overall Cognitive Status: Impaired/Different from baseline Area of Impairment: Following commands;Problem solving;Memory;Orientation                 Orientation Level:  Disoriented to;Situation   Memory: Decreased short-term memory Following Commands: Follows one step commands with increased time;Follows one step commands inconsistently (needs multimodal cues)     Problem  Solving: Slow processing;Decreased initiation;Difficulty sequencing;Requires verbal cues;Requires tactile cues General Comments: significant hearing impairment, difficulty assessing cognition   General Comments       Exercises     Shoulder Instructions      Home Living Family/patient expects to be discharged to:: Private residence Living Arrangements: Children (daughter, grandson) Available Help at Discharge: Family;Available 24 hours/day Type of Home: House Home Access: Ramped entrance     Home Layout: One level     Bathroom Shower/Tub: Tub/shower unit;Curtain   Biochemist, clinical: Standard     Home Equipment: Environmental consultant - 2 wheels;Shower seat;Wheelchair - manual;Hospital bed;Grab bars - tub/shower   Additional Comments: per daughter      Prior Functioning/Environment Level of Independence: Needs assistance  Gait / Transfers Assistance Needed: walks with a RW ADL's / Homemaking Assistance Needed: assisted for washing his back, drying feet, LB dressing and all IADL            OT Problem List: Decreased strength;Decreased activity tolerance;Impaired balance (sitting and/or standing);Decreased knowledge of use of DME or AE;Decreased cognition;Decreased safety awareness      OT Treatment/Interventions: Self-care/ADL training;Therapeutic exercise;DME and/or AE instruction;Patient/family education;Balance training;Therapeutic activities;Cognitive remediation/compensation    OT Goals(Current goals can be found in the care plan section) Acute Rehab OT Goals Patient Stated Goal: Daughter is agreeable to SNF, wants to eventually have pt return home OT Goal Formulation: With family Time For Goal Achievement: 04/03/20 Potential to Achieve Goals: Good ADL Goals Pt Will Perform Grooming: with min guard assist;standing (2 activities) Pt Will Transfer to Toilet: with min guard assist;ambulating;bedside commode Pt Will Perform Toileting - Clothing Manipulation and hygiene: with min  assist;sit to/from stand Additional ADL Goal #1: Pt will perform bed mobility with moderate assistance.  OT Frequency: Min 3X/week   Barriers to D/C:            Co-evaluation PT/OT/SLP Co-Evaluation/Treatment: Yes Reason for Co-Treatment: For patient/therapist safety   OT goals addressed during session: ADL's and self-care;Proper use of Adaptive equipment and DME      AM-PAC OT "6 Clicks" Daily Activity     Outcome Measure Help from another person eating meals?: A Little Help from another person taking care of personal grooming?: A Little Help from another person toileting, which includes using toliet, bedpan, or urinal?: Total Help from another person bathing (including washing, rinsing, drying)?: A Lot Help from another person to put on and taking off regular upper body clothing?: A Little Help from another person to put on and taking off regular lower body clothing?: Total 6 Click Score: 13   End of Session Equipment Utilized During Treatment: Gait belt;Rolling walker Nurse Communication: Mobility status;Other (comment) (observed 02 level with exertion)  Activity Tolerance: Patient tolerated treatment well Patient left: in chair;with call bell/phone within reach  OT Visit Diagnosis: Unsteadiness on feet (R26.81);Other abnormalities of gait and mobility (R26.89);Muscle weakness (generalized) (M62.81);Other symptoms and signs involving cognitive function                Time: 0347-4259 OT Time Calculation (min): 21 min Charges:  OT General Charges $OT Visit: 1 Visit OT Evaluation $OT Eval Moderate Complexity: 1 Mod  Nestor Lewandowsky, OTR/L Acute Rehabilitation Services Pager: [phone] Office: [phone]  Malka So 03/20/2020, 11:15 AM

## 2020-03-20 NOTE — Progress Notes (Signed)
   Subjective:   Patient reports feeling well this morning. Denies any pain or difficulty breathing.  No complaints at this time.  Objective:  Vital signs in last 24 hours: Vitals:   03/20/20 0245 03/20/20 0415 03/20/20 0833 03/20/20 0840  BP: (!) 150/78 105/69 92/66 (!) 152/90  Pulse: 78 73 70   Resp: 20 19    Temp: 97.8 F (36.6 C) 97.7 F (36.5 C) (!) 97.4 F (36.3 C)   TempSrc: Oral Oral Oral   SpO2: 95% 96% 98% 96%  Weight:      Height:       General- sitting in bed comfortably eating breakfast, no acute distress Lungs- normal effort, appreciated wheezing in bilateral lung fields CV-normal rate and rhythm, holosystolic murmur Extremities- no lower extremity pitting edema, nontender to palpation Skin- warm and dry Neuro- patient is oriented x3  Assessment/Plan:  Principal Problem:   COVID-19 Active Problems:   HTN (hypertension)   Chronic diastolic (congestive) heart failure (HCC)   Weakness   87 year old male history of CKD 3, CVA, HEpEF, hypertension, hyperlipidemia, recurrent UTI, meningioma, who presented to the ED for 1 week of generalized fatigue, confusion likely secondary to COVID-19 infection. Patient received 2 shots of vaccines, no booster.  COVID-19 infection Generalized weakness Patient is oriented x3.  Saturating well on 2 L supplemental oxygen.   He worked with PT and OT today who recommend SNF.  Spoke with the patient's daughter this morning who reports at baseline the patient needs help with most of his ADLs however the weakness that brought him into the hospital is much worse than his baseline.  She is concerned that he will not recover well at home and may benefit from SNF however understands that this may take up to 3 weeks given his symptoms due to his COVID infection.  Discussed the option of home health PT.  Patient's daughter is going to call later today to confirm. -Start Decadron until discharge -Continue oxygen, wean oxygen as  tolerable -Follow-up UA -SNF versus home health PT  Heart failure with preserved ejection fraction Hypertension Lipidemia Echocardiogram 09/2018 showed EF 60-65% with impaired LV relaxation. LA size with severe dilated.  Patient is not overloaded on exam although there is bilateral wheezing on exam.  With a resume Lasix 40 mg daily today. -Continue amlodipine 5 mg and atorvastatin 20 mg daily -Resume furosemide 40 mg daily  CKD 3 Creatinine baseline.  Resume Lasix 40 mg daily. -Monitor creatinine -Avoid nephrotoxic medications   Prior to Admission Living Arrangement: Home with daughter Anticipated Discharge Location: SNF versus home with daughter Barriers to Discharge: Placement to SNF may be difficult due to COVID infection.  Patient will likely need to quarantine for 21 days before any SNF facility except the patient.  Patient's daughter is considering home health PT versus SNF at this time Dispo: Anticipated discharge pending placement, SNF versus home health PT.  Mike Craze, DO 03/20/2020, 12:25 PM Pager: [phone] After 5pm on weekdays and 1pm on weekends: On Call pager [phone]

## 2020-03-20 NOTE — Plan of Care (Addendum)
Patient arrived to unit stable. Patient very [place]. Will continue to monitor patient.   Problem: Education: Goal: Knowledge of risk factors and measures for prevention of condition will improve Outcome: Progressing   Problem: Coping: Goal: Psychosocial and spiritual needs will be supported Outcome: Progressing   Problem: Respiratory: Goal: Will maintain a patent airway Outcome: Progressing

## 2020-03-20 NOTE — Plan of Care (Signed)

## 2020-03-20 NOTE — Evaluation (Signed)
Physical Therapy Evaluation Patient Details Name: [name] MRN: [id number] DOB: Dec 03, 1924 Today's Date: 03/20/2020   History of Present Illness  Pt admitted with 1 week a fatigue, generalized weakness, disorientation and fever. COVID 19 positive. PMH: CKD 3, CVA, CHF, HTN, HLD, recurrent UTI, meningioma, BPH.  Clinical Impression  PTA, patient ambulating with RW and required assistance for ADLs. Patient presents with impaired balance with posterior lean in sitting and standing, decreased activity tolerance, impaired cognition, generalized weakness. Patient is severely HOH. Patient on 2L O2 [facility] upon arrival with spO2 96%, on RA spO2 maintained >94% at rest and >92% with activity. Patient requires heavy assist for bed mobility and transfers and +2 for ambulation for safety due to posterior lean. Patient will benefit from skilled PT services during acute stay to address listed deficits. Recommend SNF following discharge to maximize functional mobility and safety.      Follow Up Recommendations SNF;Supervision/Assistance - 24 hour    Equipment Recommendations       Recommendations for Other Services       Precautions / Restrictions Precautions Precautions: Fall Restrictions Weight Bearing Restrictions: No      Mobility  Bed Mobility Overal bed mobility: Needs Assistance Bed Mobility: Supine to Sit     Supine to sit: +2 for physical assistance;Max assist     General bed mobility comments: multimodal cues to sequence, assist for LEs over EOB, to raise trunk and to advance hips to EOB    Transfers Overall transfer level: Needs assistance Equipment used: Rolling Kalil Woessner (2 wheeled) Transfers: Sit to/from Stand Sit to Stand: Mod assist         General transfer comment: posterior lean, assist to rise and steady  Ambulation/Gait                Stairs            Wheelchair Mobility    Modified Rankin (Stroke Patients Only)       Balance Overall  balance assessment: Needs assistance   Sitting balance-Leahy Scale: Poor Sitting balance - Comments: min guard for static sitting Postural control: Posterior lean Standing balance support: Bilateral upper extremity supported Standing balance-Leahy Scale: Poor Standing balance comment: mod assist, +2 for safety                             Pertinent Vitals/Pain Pain Assessment: No/denies pain    Home Living Family/patient expects to be discharged to:: Private residence Living Arrangements: Children (daughter, grandson) Available Help at Discharge: Family;Available 24 hours/day Type of Home: House Home Access: Ramped entrance     Home Layout: One level Home Equipment: Damin Salido - 2 wheels;Shower seat;Wheelchair - manual;Hospital bed;Grab bars - tub/shower Additional Comments: per daughter    Prior Function Level of Independence: Needs assistance   Gait / Transfers Assistance Needed: walks with a RW  ADL's / Homemaking Assistance Needed: assisted for washing his back, drying feet, LB dressing and all IADL        Hand Dominance   Dominant Hand: Right    Extremity/Trunk Assessment   Upper Extremity Assessment Upper Extremity Assessment: Defer to OT evaluation    Lower Extremity Assessment Lower Extremity Assessment: Generalized weakness    Cervical / Trunk Assessment Cervical / Trunk Assessment: Other exceptions Cervical / Trunk Exceptions: weakness  Communication   Communication: HOH;Expressive difficulties  Cognition Arousal/Alertness: Awake/alert Behavior During Therapy: WFL for tasks assessed/performed Overall Cognitive Status: Impaired/Different from baseline Area of  Impairment: Following commands;Problem solving;Memory;Orientation                 Orientation Level: Disoriented to;Situation   Memory: Decreased short-term memory Following Commands: Follows one step commands with increased time;Follows one step commands inconsistently (needs  multimodal cues)     Problem Solving: Slow processing;Decreased initiation;Difficulty sequencing;Requires verbal cues;Requires tactile cues General Comments: significant hearing impairment, difficulty assessing cognition      General Comments General comments (skin integrity, edema, etc.): Upon arrival, patient on 2L O2 [place] with spO2 96%. On RA with mobility, patient spO2 maintained >92%. spO2 94% on RA upon departure    Exercises     Assessment/Plan    PT Assessment Patient needs continued PT services  PT Problem List Decreased strength;Decreased activity tolerance;Decreased balance;Decreased mobility;Decreased safety awareness;Cardiopulmonary status limiting activity       PT Treatment Interventions DME instruction;Gait training;Functional mobility training;Therapeutic activities;Therapeutic exercise;Balance training;Patient/family education    PT Goals (Current goals can be found in the Care Plan section)  Acute Rehab PT Goals Patient Stated Goal: Daughter is agreeable to SNF, wants to eventually have pt return home PT Goal Formulation: With patient/family Time For Goal Achievement: 04/03/20 Potential to Achieve Goals: Fair    Frequency Min 3X/week   Barriers to discharge        Co-evaluation PT/OT/SLP Co-Evaluation/Treatment: Yes Reason for Co-Treatment: For patient/therapist safety PT goals addressed during session: Mobility/safety with mobility;Balance;Proper use of DME OT goals addressed during session: ADL's and self-care;Proper use of Adaptive equipment and DME       AM-PAC PT "6 Clicks" Mobility  Outcome Measure Help needed turning from your back to your side while in a flat bed without using bedrails?: A Lot Help needed moving from lying on your back to sitting on the side of a flat bed without using bedrails?: A Lot Help needed moving to and from a bed to a chair (including a wheelchair)?: A Lot Help needed standing up from a chair using your arms (e.g.,  wheelchair or bedside chair)?: A Lot Help needed to walk in hospital room?: A Lot Help needed climbing 3-5 steps with a railing? : Total 6 Click Score: 11    End of Session Equipment Utilized During Treatment: Gait belt Activity Tolerance: Patient tolerated treatment well Patient left: in chair;with call bell/phone within reach Nurse Communication: Mobility status PT Visit Diagnosis: Unsteadiness on feet (R26.81);Other abnormalities of gait and mobility (R26.89);Muscle weakness (generalized) (M62.81);History of falling (Z91.81);Difficulty in walking, not elsewhere classified (R26.2)    Time: 1610-9604 PT Time Calculation (min) (ACUTE ONLY): 21 min   Charges:   PT Evaluation $PT Eval Moderate Complexity: 1 Mod          Kara Melching A. Gilford Rile PT, DPT Acute Rehabilitation Services Pager [phone] Office [phone]   Alda Lea 03/20/2020, 11:29 AM

## 2020-03-20 NOTE — Progress Notes (Addendum)
SATURATION QUALIFICATIONS: (This note is used to comply with regulatory documentation for home oxygen)  Patient Saturations on Room Air at Rest = 93%  Patient Saturations on Room Air while Ambulating = 78%  Patient Saturations on 2 Liters of oxygen while Ambulating = 94%  Patient's oxygen saturation drops down to <85% when he's asleep and goes >94% when awake at rest.

## 2020-03-21 LAB — CBC WITH DIFFERENTIAL/PLATELET
Abs Immature Granulocytes: 0.02 10*3/uL (ref 0.00–0.07)
Basophils Absolute: 0 10*3/uL (ref 0.0–0.1)
Basophils Relative: 0 %
Eosinophils Absolute: 0 10*3/uL (ref 0.0–0.5)
Eosinophils Relative: 0 %
HCT: 34.9 % — ABNORMAL LOW (ref 39.0–52.0)
Hemoglobin: 11.8 g/dL — ABNORMAL LOW (ref 13.0–17.0)
Immature Granulocytes: 0 %
Lymphocytes Relative: 16 %
Lymphs Abs: 1 10*3/uL (ref 0.7–4.0)
MCH: 31.5 pg (ref 26.0–34.0)
MCHC: 33.8 g/dL (ref 30.0–36.0)
MCV: 93.1 fL (ref 80.0–100.0)
Monocytes Absolute: 0.4 10*3/uL (ref 0.1–1.0)
Monocytes Relative: 6 %
Neutro Abs: 4.9 10*3/uL (ref 1.7–7.7)
Neutrophils Relative %: 78 %
Platelets: 127 10*3/uL — ABNORMAL LOW (ref 150–400)
RBC: 3.75 MIL/uL — ABNORMAL LOW (ref 4.22–5.81)
RDW: 13.6 % (ref 11.5–15.5)
WBC: 6.3 10*3/uL (ref 4.0–10.5)
nRBC: 0 % (ref 0.0–0.2)

## 2020-03-21 LAB — BASIC METABOLIC PANEL
Anion gap: 10 (ref 5–15)
BUN: 35 mg/dL — ABNORMAL HIGH (ref 8–23)
CO2: 25 mmol/L (ref 22–32)
Calcium: 9.6 mg/dL (ref 8.9–10.3)
Chloride: 102 mmol/L (ref 98–111)
Creatinine, Ser: 1.33 mg/dL — ABNORMAL HIGH (ref 0.61–1.24)
GFR, Estimated: 50 mL/min — ABNORMAL LOW (ref >60)
Glucose, Bld: 144 mg/dL — ABNORMAL HIGH (ref 70–99)
Potassium: 4.1 mmol/L (ref 3.5–5.1)
Sodium: 137 mmol/L (ref 135–145)

## 2020-03-21 LAB — C-REACTIVE PROTEIN: CRP: 1.3 mg/dL — ABNORMAL HIGH (ref <1.0)

## 2020-03-21 NOTE — Plan of Care (Signed)
?  Problem: Clinical Measurements: ?Goal: Ability to maintain clinical measurements within normal limits will improve ?Outcome: Progressing ?Goal: Will remain free from infection ?Outcome: Progressing ?Goal: Diagnostic test results will improve ?Outcome: Progressing ?  ?

## 2020-03-21 NOTE — Progress Notes (Signed)
   Subjective:   Patient evaluated at bedside.  States that he feels well this morning, no acute complaints.  Denies feeling weak, short of breath, or having pain anywhere.  Seems to be in good spirits, but unclear how much he understands.  Difficult to assess with his impaired hearing.  Objective:  Vital signs in last 24 hours: Vitals:   03/20/20 0840 03/20/20 1626 03/20/20 2100 03/21/20 0007  BP: (!) 152/90 138/80 125/86 (!) 145/96  Pulse:  84 78 79  Resp:  18 18 18   Temp:  97.8 F (36.6 C) 98 F (36.7 C)   TempSrc:  Oral Oral   SpO2: 96% 95% 94% 96%  Weight:      Height:        Physical Exam Constitutional: no acute distress, very hard of hearing Head: atraumatic ENT: external ears normal Eyes: EOMI Cardiovascular: regular rate and rhythm, normal heart sounds, no edema Pulmonary: effort normal, lungs clear to ascultation bilaterally Abdominal: flat, nontender, no rebound tenderness, bowel sounds normal Skin: warm and dry Neurological: alert, no focal deficit, oriented x3 Psychiatric: normal mood and affect  Assessment/Plan: BRACE WELTE is a 87 y.o. male with hx of CKD 3, CVA, HFpEF, hypertension, hyperlipidemia, recurrent UTIs, meningioma presenting with generalized fatigue and confusion.  Has a positive COVID-19 test, has had vaccination x2 and relatively mild symptoms.  Principal Problem:   COVID-19 Active Problems:   HTN (hypertension)   Chronic diastolic (congestive) heart failure (HCC)   Weakness  COVID-19 infection Generalized weakness COVID-19 test positive.  Chest x-ray without pneumonia. CRP 1.3 < 2.3.  No leukocytosis.  Symptoms started 1 week prior to admission.  O2 saturation in mid to upper 90s on 2 L by nasal cannula.  He at baseline requires assistance with most of his ADLs, but is reportedly weak beyond his baseline at this time.  PT recommended SNF, but discussed with daughter that this placement may take prolonged amount of time due to his Covid  infection.  Discussed the option of home health PT, which they are considering.  She is also concerned about catching COVID from him, and notes she has his sole caretaker. -Continue Decadron day 3 -Wean oxygen as tolerated -Urinalysis not obtained due to incontinence -SNF versus home health PT, follow-up with daughter about results of her Covid test  Chronic HFpEF Hypertension Hyperlipidemia Echo in July 2020 with EF 60 to 65% and impaired LV relaxation, LA size severely dilated.  No sign of acute exacerbation. -Continue home Lasix 40 mg daily -Continue home amlodipine 5 mg -Continue home atorvastatin 20 mg  CKD 3 Creatinine 1.45 on admission and now 1.33 which is near his baseline. -Daily BMP -Avoid nephrotoxins  Diet:  Heart healthy IVF:  none VTE:  heparin Prior to Admission Living Arrangement:  home Anticipated Discharge Location:  SNF vs home with [facility] PT Barriers to Discharge:  PT needs Dispo: Medically stable for discharge, pending patient and family decision about destination  Andrew Au, MD 03/21/2020, 6:12 AM Pager: [phone] After 5pm on weekdays and 1pm on weekends: On Call pager [phone]

## 2020-03-22 DIAGNOSIS — R531 Weakness: Secondary | ICD-10-CM

## 2020-03-22 DIAGNOSIS — U071 COVID-19: Secondary | ICD-10-CM | POA: Diagnosis not present

## 2020-03-22 LAB — CBC WITH DIFFERENTIAL/PLATELET
Abs Immature Granulocytes: 0.06 10*3/uL (ref 0.00–0.07)
Basophils Absolute: 0 10*3/uL (ref 0.0–0.1)
Basophils Relative: 0 %
Eosinophils Absolute: 0 10*3/uL (ref 0.0–0.5)
Eosinophils Relative: 0 %
HCT: 38.6 % — ABNORMAL LOW (ref 39.0–52.0)
Hemoglobin: 12.2 g/dL — ABNORMAL LOW (ref 13.0–17.0)
Immature Granulocytes: 1 %
Lymphocytes Relative: 15 %
Lymphs Abs: 1 10*3/uL (ref 0.7–4.0)
MCH: 29.5 pg (ref 26.0–34.0)
MCHC: 31.6 g/dL (ref 30.0–36.0)
MCV: 93.5 fL (ref 80.0–100.0)
Monocytes Absolute: 0.3 10*3/uL (ref 0.1–1.0)
Monocytes Relative: 4 %
Neutro Abs: 5.1 10*3/uL (ref 1.7–7.7)
Neutrophils Relative %: 80 %
Platelets: 127 10*3/uL — ABNORMAL LOW (ref 150–400)
RBC: 4.13 MIL/uL — ABNORMAL LOW (ref 4.22–5.81)
RDW: 13.6 % (ref 11.5–15.5)
WBC: 6.3 10*3/uL (ref 4.0–10.5)
nRBC: 0 % (ref 0.0–0.2)

## 2020-03-22 LAB — BASIC METABOLIC PANEL
Anion gap: 9 (ref 5–15)
BUN: 32 mg/dL — ABNORMAL HIGH (ref 8–23)
CO2: 27 mmol/L (ref 22–32)
Calcium: 9.8 mg/dL (ref 8.9–10.3)
Chloride: 100 mmol/L (ref 98–111)
Creatinine, Ser: 1.23 mg/dL (ref 0.61–1.24)
GFR, Estimated: 55 mL/min — ABNORMAL LOW (ref >60)
Glucose, Bld: 132 mg/dL — ABNORMAL HIGH (ref 70–99)
Potassium: 4.2 mmol/L (ref 3.5–5.1)
Sodium: 136 mmol/L (ref 135–145)

## 2020-03-22 LAB — C-REACTIVE PROTEIN: CRP: 0.7 mg/dL (ref <1.0)

## 2020-03-22 MED ORDER — ASPIRIN 81 MG PO TBEC: 81.0000 mg | DELAYED_RELEASE_TABLET | Freq: Every day | ORAL | Status: DC

## 2020-03-22 MED ORDER — ALBUTEROL SULFATE HFA 108 (90 BASE) MCG/ACT IN AERS
1.0000 | INHALATION_SPRAY | Freq: Four times a day (QID) | RESPIRATORY_TRACT | Status: DC
  Administered 2020-03-22 – 2020-03-23 (×5): 2 via RESPIRATORY_TRACT

## 2020-03-22 MED ORDER — DEXAMETHASONE 6 MG PO TABS
6.0000 mg | ORAL_TABLET | Freq: Every day | ORAL | Status: DC
  Administered 2020-03-22 – 2020-03-24 (×3): 6 mg via ORAL
  Filled 2020-03-22 (×3): qty 1

## 2020-03-22 NOTE — Plan of Care (Signed)

## 2020-03-22 NOTE — Plan of Care (Signed)

## 2020-03-22 NOTE — Progress Notes (Deleted)
   Subjective:   Patient reports feeling well today. Does not have any complaints. States he is eating well. Oriented x1.   Objective:  Vital signs in last 24 hours: Vitals:   03/21/20 0007 03/21/20 0500 03/21/20 0800 03/21/20 2007  BP: (!) 145/96 118/79 126/74 140/87  Pulse: 79 69 77 73  Resp: 18 17 17 17   Temp:  98.6 F (37 C) 97.7 F (36.5 C) 98.1 F (36.7 C)  TempSrc:  Oral Oral Oral  SpO2: 96% 97% 100% 97%  Weight:      Height:        Physical Exam Constitutional: no acute distress, very hard of hearing Head: atraumatic ENT: external ears normal Eyes: EOMI Cardiovascular: regular rate and rhythm, normal heart sounds, no edema Pulmonary: effort normal, diffuse wheezing to bilateral lungs Abdominal: flat, nontender, no rebound tenderness, bowel sounds normal Skin: warm and dry Neurological: alert, no focal deficit, oriented x1 Psychiatric: normal mood and affect  Assessment/Plan: [name] is a 87 y.o. male with hx of CKD 3, CVA, HFpEF, hypertension, hyperlipidemia, recurrent UTIs, meningioma presenting with generalized fatigue and confusion.  Has a positive COVID-19 test, has had vaccination x2 and relatively mild symptoms.  Principal Problem:   COVID-19 Active Problems:   HTN (hypertension)   Chronic diastolic (congestive) heart failure (HCC)   Weakness  COVID-19 infection Wheezing Generalized weakness COVID-19 test positive.  Chest x-ray without pneumonia. CRP 1.3 < 2.3.  No leukocytosis.  Symptoms started 1 week prior to admission.  O2 saturation in mid to upper 90s on 2 L by nasal cannula.  He at baseline requires assistance with most of his ADLs, but is reportedly weak beyond his baseline at this time.  PT recommended SNF, but discussed with daughter that this placement may take prolonged amount of time due to his Covid infection.  Discussed the option of home health PT, which they are considering.  She is also concerned about catching COVID from him,  and notes she has his sole caretaker. -continue Decadron, day 3 -start albuterol every 6 hours -now on room air, may restart O2 if needed -Urinalysis not obtained due to incontinence -SNF versus home health PT, follow-up with daughter about results of her Covid test  Chronic HFpEF Hypertension Echo in July 2020 with EF 60 to 65% and impaired LV relaxation, LA size severely dilated.  No sign of acute exacerbation. -Continue home Lasix 40 mg daily -Continue home amlodipine 5 mg  AKI on CKD 3 Creatinine 1.23 < 1.33, which is at his baseline. -Daily BMP -Avoid nephrotoxins  Hx CVA, vascular dementia HLD -Continue home lipitor 20mg  -BP control as above  Diet:  Heart healthy IVF:  none VTE:  heparin Prior to Admission Living Arrangement:  home Anticipated Discharge Location:  SNF vs home with [facility] PT Barriers to Discharge:  PT needs Dispo: Medically stable for discharge, pending patient and family decision about destination  Andrew Au, MD 03/22/2020, 6:52 AM Pager: [phone] After 5pm on weekdays and 1pm on weekends: On Call pager [phone]

## 2020-03-22 NOTE — TOC Initial Note (Signed)
Transition of Care [facility]) - Initial/Assessment Note    Patient Details  Name: [name] MRN: [id number] Date of Birth: January 29, 1925  [facility]) CM/SW Contact:    Sharin Mons, RN Phone Number: 03/22/2020, 11:31 AM  Clinical Narrative:     Admitted with generalized weakness/ confusion , COVID -19. Hx of CKD 3, CVA, HF, hypertension, hyperlipidemia, recurrent UTIs, meningioma. From home with daughter [name] and grand son          NCM received consult for possible SNF placement at time of discharge. NCM spoke with pt and patient's daughter regarding PT recommendation of SNF placement at time of discharge. Daughter is pt 's caretaker and  reports she  is currently unable to care for patient at their home given patient's current physical needs and fall risk. Pt and daughter agreeable to SNF placement at time of discharge. Daughter without preference for SNF. NCM discussed insurance authorization process and provided Medicare SNF ratings list. CM to continue to follow and assist with discharge planning needs.   Expected Discharge Plan: Skilled Nursing Facility Barriers to Discharge: Continued Medical Work up,No SNF bed   Patient Goals and CMS Choice        Expected Discharge Plan and Services Expected Discharge Plan: [place]                                              Prior Living Arrangements/Services                       Activities of Daily Living Home Assistive Devices/Equipment: Environmental consultant (specify type) ADL Screening (condition at time of admission) Patient's cognitive ability adequate to safely complete daily activities?: Yes Is the patient deaf or have difficulty hearing?: Yes Does the patient have difficulty seeing, even when wearing glasses/contacts?: No Does the patient have difficulty concentrating, remembering, or making decisions?: Yes Patient able to express need for assistance with ADLs?: Yes Does the  patient have difficulty dressing or bathing?: Yes Independently performs ADLs?: No Does the patient have difficulty walking or climbing stairs?: Yes Weakness of Legs: Both Weakness of Arms/Hands: Both  Permission Sought/Granted                  Emotional Assessment              Admission diagnosis:  Weakness [R53.1] Patient Active Problem List   Diagnosis Date Noted  . Weakness 03/19/2020  . COVID-19 03/19/2020  . Cerebrovascular accident (CVA) ([place]) 04/17/2019  . Partial symptomatic epilepsy with complex partial seizures, not intractable, without status epilepticus ([place]) 04/17/2019  . Chronic diastolic (congestive) heart failure ([place]) 10/28/2017  . Cerebellar ataxia in diseases classified elsewhere ([place]) 02/13/2017  . Vascular dementia ([place]) 01/28/2016  . Presence of IVC filter 07/17/2014  . CKD (chronic kidney disease) stage 3, GFR 30-59 ml/min (HCC) 06/23/2013  . Hyperlipidemia   . BPH (benign prostatic hyperplasia) 04/24/2013  . Hearing loss 07/20/2011  . Meningioma ([place]) 07/20/2011  . HTN (hypertension) 07/19/2011   PCP:  [name], DO Pharmacy:   [facility] [place], [place] [address] Phone: [phone] Fax: [place], [address] [place], [address]  [address] [place] [address] Phone: [phone] Fax: [phone]     Social Determinants of Health (SDOH) Interventions    Readmission Risk Interventions No flowsheet data found.

## 2020-03-22 NOTE — Progress Notes (Signed)
   Subjective:   Patient reports feeling well today. Does not have any complaints. States he is eating well. Oriented x1.   Objective:  Vital signs in last 24 hours: Vitals:   03/21/20 0800 03/21/20 2007 03/22/20 0715 03/22/20 0855  BP: 126/74 140/87  (!) 155/91  Pulse: 77 73  77  Resp: 17 17  18   Temp: 97.7 F (36.5 C) 98.1 F (36.7 C)  98.2 F (36.8 C)  TempSrc: Oral Oral  Oral  SpO2: 100% 97% 97% 93%  Weight:      Height:        Physical Exam Constitutional: no acute distress, very hard of hearing Head: atraumatic ENT: external ears normal Eyes: EOMI Cardiovascular: regular rate and rhythm, normal heart sounds, no edema Pulmonary: effort normal, diffuse wheezing to bilateral lungs Abdominal: flat, nontender, no rebound tenderness, bowel sounds normal Skin: warm and dry Neurological: alert, no focal deficit, oriented x1 Psychiatric: normal mood and affect  Assessment/Plan: [name] is a 87 y.o. male with hx of CKD 3, CVA, HFpEF, hypertension, hyperlipidemia, recurrent UTIs, meningioma presenting with generalized fatigue and confusion.  Has a positive COVID-19 test, has had vaccination x2 and relatively mild symptoms.  Principal Problem:   COVID-19 Active Problems:   HTN (hypertension)   Chronic diastolic (congestive) heart failure (HCC)   Weakness  COVID-19 infection Wheezing Generalized weakness COVID-19 test positive.  Chest x-ray without pneumonia. CRP 1.3 < 2.3.  No leukocytosis.  Symptoms started 1 week prior to admission.  O2 saturation in mid to upper 90s on 2 L by nasal cannula.  He at baseline requires assistance with most of his ADLs, but is reportedly weak beyond his baseline at this time.  PT recommended SNF, but discussed with daughter that this placement may take prolonged amount of time due to his Covid infection.  Discussed the option of home health PT, which they are considering.  She is also concerned about catching COVID from him, and notes  she has his sole caretaker. -continue Decadron, day 3 -start albuterol every 6 hours -now on room air, may restart O2 if needed -Urinalysis not obtained due to incontinence -SNF versus home health PT, follow-up with daughter about results of her Covid test  Chronic HFpEF Hypertension Echo in July 2020 with EF 60 to 65% and impaired LV relaxation, LA size severely dilated.  No sign of acute exacerbation. -Continue home Lasix 40 mg daily -Continue home amlodipine 5 mg  AKI on CKD 3 Creatinine 1.23 < 1.33, which is at his baseline. -Daily BMP -Avoid nephrotoxins  Hx CVA, vascular dementia HLD -Continue home lipitor 20mg  -BP control as above  Diet:  Heart healthy IVF:  none VTE:  heparin Prior to Admission Living Arrangement:  home Anticipated Discharge Location:  SNF vs home with [facility] PT Barriers to Discharge:  PT needs Dispo: Medically stable for discharge, pending SNF placement for this COVID positive patient  Andrew Au, MD 03/22/2020, 1:22 PM Pager: [phone] After 5pm on weekdays and 1pm on weekends: On Call pager [phone]

## 2020-03-22 NOTE — NC FL2 (Signed)
[place] LEVEL OF CARE SCREENING TOOL     IDENTIFICATION  Patient Name: STEPHANIE MCGLONE Birthdate: October 13, 1924 Sex: male Admission Date (Current Location): 03/19/2020  [place] and [place] Number:  Herbalist and Address:  [facility], [place] [address], [place], [place] [address]      Provider Number: [id number]  Attending Physician Name and Address:  Velna Ochs, MD  Relative Name and Phone Number:       Current Level of Care: SNF Recommended Level of Care: [place] Prior Approval Number:    Date Approved/Denied:   PASRR Number: [id number]  Discharge Plan: SNF    Current Diagnoses: Patient Active Problem List   Diagnosis Date Noted  . Weakness 03/19/2020  . COVID-19 03/19/2020  . Cerebrovascular accident (CVA) ([place]) 04/17/2019  . Partial symptomatic epilepsy with complex partial seizures, not intractable, without status epilepticus ([place]) 04/17/2019  . Chronic diastolic (congestive) heart failure ([place]) 10/28/2017  . Cerebellar ataxia in diseases classified elsewhere ([place]) 02/13/2017  . Vascular dementia ([place]) 01/28/2016  . Presence of IVC filter 07/17/2014  . CKD (chronic kidney disease) stage 3, GFR 30-59 ml/min (HCC) 06/23/2013  . Hyperlipidemia   . BPH (benign prostatic hyperplasia) 04/24/2013  . Hearing loss 07/20/2011  . Meningioma ([place]) 07/20/2011  . HTN (hypertension) 07/19/2011    Orientation RESPIRATION BLADDER Height & Weight     Self,Time,Situation,Place (Periods of confusion)  O2 Incontinent Weight: 92.8 kg Height:  5\' 10"  (177.8 cm)  BEHAVIORAL SYMPTOMS/MOOD NEUROLOGICAL BOWEL NUTRITION STATUS      Continent Diet (Refer to d/c summary)  AMBULATORY STATUS COMMUNICATION OF NEEDS Skin   Extensive Assist Verbally Normal                       Personal Care Assistance Level of Assistance  Bathing,Feeding,Dressing Bathing Assistance: Maximum assistance Feeding assistance:  Limited assistance Dressing Assistance: Maximum assistance     Functional Limitations Info  Sight,Hearing,Speech Sight Info: Adequate Hearing Info: Impaired (HOH) Speech Info: Adequate    SPECIAL CARE FACTORS FREQUENCY  PT (By licensed PT),OT (By licensed OT)     PT Frequency: 5x /per week , evaluate and treat OT Frequency: 5x /per week , evaluate and treat            Contractures Contractures Info: Not present    Additional Factors Info  Code Status,Allergies Code Status Info: DNR Allergies Info: No known drug allergy           Current Medications (03/22/2020):  This is the current hospital active medication list Current Facility-Administered Medications  Medication Dose Route Frequency Provider Last Rate Last Admin  . acetaminophen (TYLENOL) tablet 500 mg  500 mg Oral Q6H PRN Jean Rosenthal, MD   500 mg at 03/21/20 0951  . albuterol (VENTOLIN HFA) 108 (90 Base) MCG/ACT inhaler 1-2 puff  1-2 puff Inhalation Q6H Rehman, Areeg N, DO   2 puff at 03/22/20 1147  . amLODipine (NORVASC) tablet 5 mg  5 mg Oral Daily Jean Rosenthal, MD   5 mg at 03/22/20 0935  . atorvastatin (LIPITOR) tablet 20 mg  20 mg Oral QHS Agyei, Obed K, MD   20 mg at 03/21/20 2253  . dexamethasone (DECADRON) tablet 6 mg  6 mg Oral Daily Rehman, Areeg N, DO   6 mg at 03/22/20 1146  . feeding supplement (ENSURE ENLIVE / ENSURE PLUS) liquid 237 mL  237 mL Oral BID BM Agyei, Obed  K, MD   237 mL at 03/22/20 0934  . furosemide (LASIX) tablet 40 mg  40 mg Oral Daily Rehman, Areeg N, DO   40 mg at 03/22/20 0935  . heparin injection 5,000 Units  5,000 Units Subcutaneous Q8H Jean Rosenthal, MD   5,000 Units at 03/22/20 0559  . levETIRAcetam (KEPPRA XR) 24 hr tablet 500 mg  500 mg Oral QHS Agyei, Obed K, MD   500 mg at 03/21/20 2253  . polyethylene glycol (MIRALAX / GLYCOLAX) packet 17 g  17 g Oral Daily PRN Jean Rosenthal, MD         Discharge Medications: Please see discharge summary for a list of discharge  medications.  Relevant Imaging Results:  Relevant Lab Results:   Additional Information SS# [id number], COVID positive noted 1/7  Sharin Mons, [place]

## 2020-03-22 NOTE — Progress Notes (Signed)
   Pt is active with [facility].  Noted plan for d/c to SNF.  Will continue to follow hospital course.  Please call [facility] with questions or concerns.  Wynetta Fines, RN  Office [phone] Mobile [phone]

## 2020-03-22 NOTE — Progress Notes (Signed)
Physical Therapy Treatment Patient Details Name: [name] MRN: [id number] DOB: 03-25-1924 Today's Date: 03/22/2020    History of Present Illness Pt admitted with 1 week a fatigue, generalized weakness, disorientation and fever. COVID 19 positive. PMH: CKD 3, CVA, CHF, HTN, HLD, recurrent UTI, meningioma, BPH.    PT Comments    Continuing work on functional mobility and activity tolerance;  Session focused on transfer training, and monitoring O2 sats during activity; continues to require substantial assist for getting up to EOB and transferring OOB to recliner; Able to stand for hygeine (was incontinent of urine upon arrival) with mod assist to steady due to heavy posterior lean; Participates very well, and I am hopeful for good progress  Follow Up Recommendations  SNF;Supervision/Assistance - 24 hour     Equipment Recommendations  Rolling walker with 5" wheels;3in1 (PT);Wheelchair (measurements PT)    Recommendations for Other Services       Precautions / Restrictions Precautions Precautions: Fall    Mobility  Bed Mobility Overal bed mobility: Needs Assistance Bed Mobility: Supine to Sit     Supine to sit: +2 for physical assistance;Max assist     General bed mobility comments: multimodal cues to sequence, assist for LEs over EOB, to raise trunk and to advance hips to EOB  Transfers Overall transfer level: Needs assistance Equipment used: Rolling walker (2 wheeled) Transfers: Sit to/from [name] Sit to Stand: Mod assist Stand pivot transfers: +2 physical assistance;Mod assist       General transfer comment: posterior lean, assist to rise and steady; difficulty keeping weight anteriorly over his feet; opted to perform basic pivot transfer with +2 assist  Ambulation/Gait Ambulation/Gait assistance: Max assist   Assistive device: Rolling walker (2 wheeled)       General Gait Details: Attempted to take steps, however difficulty with weight  shifting, and keeping his weight anteriorly over his feet   Stairs             Wheelchair Mobility    Modified Rankin (Stroke Patients Only)       Balance     Sitting balance-Leahy Scale: Poor       Standing balance-Leahy Scale: Poor                              Cognition Arousal/Alertness: Awake/alert Behavior During Therapy: WFL for tasks assessed/performed Overall Cognitive Status: Impaired/Different from baseline Area of Impairment: Following commands;Problem solving;Memory;Orientation                 Orientation Level: Disoriented to;Situation   Memory: Decreased short-term memory Following Commands: Follows one step commands with increased time;Follows one step commands inconsistently (needs multimodal cues)     Problem Solving: Slow processing;Decreased initiation;Difficulty sequencing;Requires verbal cues;Requires tactile cues General Comments: significant hearing impairment, difficulty assessing cognition      Exercises      General Comments General comments (skin integrity, edema, etc.): Session conducted on Room Air; O2 sats ranged 87-94%; incontinent of urine upon arrival      Pertinent Vitals/Pain Pain Assessment: No/denies pain    Home Living                      Prior Function            PT Goals (current goals can now be found in the care plan section) Acute Rehab PT Goals Patient Stated Goal: Daughter is agreeable to SNF, wants to  eventually have pt return home PT Goal Formulation: With patient/family Time For Goal Achievement: 04/03/20 Potential to Achieve Goals: Fair Progress towards PT goals: Progressing toward goals (progressing slowly)    Frequency    Min 2X/week      PT Plan Current plan remains appropriate;Frequency needs to be updated (per dept protocol)    Co-evaluation              AM-PAC PT "6 Clicks" Mobility   Outcome Measure  Help needed turning from your back to your  side while in a flat bed without using bedrails?: A Lot Help needed moving from lying on your back to sitting on the side of a flat bed without using bedrails?: A Lot Help needed moving to and from a bed to a chair (including a wheelchair)?: A Lot Help needed standing up from a chair using your arms (e.g., wheelchair or bedside chair)?: A Lot Help needed to walk in hospital room?: A Lot Help needed climbing 3-5 steps with a railing? : Total 6 Click Score: 11    End of Session Equipment Utilized During Treatment: Gait belt Activity Tolerance: Patient tolerated treatment well Patient left: in chair;with call bell/phone within reach Nurse Communication: Mobility status;Need for lift equipment (consider Maximove versus stedy) PT Visit Diagnosis: Unsteadiness on feet (R26.81);Other abnormalities of gait and mobility (R26.89);Muscle weakness (generalized) (M62.81);History of falling (Z91.81);Difficulty in walking, not elsewhere classified (R26.2)     Time: 3361-2244 PT Time Calculation (min) (ACUTE ONLY): 32 min  Charges:  $Therapeutic Activity: 23-37 mins                     [name], PT  Acute Rehabilitation Services Pager [phone] Office [name] 03/22/2020, 6:38 PM

## 2020-03-23 DIAGNOSIS — U071 COVID-19: Secondary | ICD-10-CM | POA: Diagnosis not present

## 2020-03-23 LAB — CBC WITH DIFFERENTIAL/PLATELET
Abs Immature Granulocytes: 0.16 10*3/uL — ABNORMAL HIGH (ref 0.00–0.07)
Basophils Absolute: 0 10*3/uL (ref 0.0–0.1)
Basophils Relative: 0 %
Eosinophils Absolute: 0 10*3/uL (ref 0.0–0.5)
Eosinophils Relative: 0 %
HCT: 40.7 % (ref 39.0–52.0)
Hemoglobin: 12.9 g/dL — ABNORMAL LOW (ref 13.0–17.0)
Immature Granulocytes: 2 %
Lymphocytes Relative: 16 %
Lymphs Abs: 1.1 10*3/uL (ref 0.7–4.0)
MCH: 29.5 pg (ref 26.0–34.0)
MCHC: 31.7 g/dL (ref 30.0–36.0)
MCV: 93.1 fL (ref 80.0–100.0)
Monocytes Absolute: 0.7 10*3/uL (ref 0.1–1.0)
Monocytes Relative: 10 %
Neutro Abs: 4.8 10*3/uL (ref 1.7–7.7)
Neutrophils Relative %: 72 %
Platelets: 131 10*3/uL — ABNORMAL LOW (ref 150–400)
RBC: 4.37 MIL/uL (ref 4.22–5.81)
RDW: 13.4 % (ref 11.5–15.5)
WBC: 6.7 10*3/uL (ref 4.0–10.5)
nRBC: 0 % (ref 0.0–0.2)

## 2020-03-23 LAB — BASIC METABOLIC PANEL
Anion gap: 8 (ref 5–15)
BUN: 29 mg/dL — ABNORMAL HIGH (ref 8–23)
CO2: 30 mmol/L (ref 22–32)
Calcium: 10.4 mg/dL — ABNORMAL HIGH (ref 8.9–10.3)
Chloride: 101 mmol/L (ref 98–111)
Creatinine, Ser: 1.13 mg/dL (ref 0.61–1.24)
GFR, Estimated: >60 mL/min (ref >60)
Glucose, Bld: 118 mg/dL — ABNORMAL HIGH (ref 70–99)
Potassium: 4.2 mmol/L (ref 3.5–5.1)
Sodium: 139 mmol/L (ref 135–145)

## 2020-03-23 LAB — C-REACTIVE PROTEIN: CRP: 0.6 mg/dL (ref <1.0)

## 2020-03-23 MED ORDER — ALBUTEROL SULFATE HFA 108 (90 BASE) MCG/ACT IN AERS: 1.0000 | INHALATION_SPRAY | Freq: Four times a day (QID) | RESPIRATORY_TRACT | Status: DC | PRN

## 2020-03-23 NOTE — Plan of Care (Signed)

## 2020-03-23 NOTE — TOC Progression Note (Signed)
Transition of Care [facility]) - Progression Note    Patient Details  Name: [name] MRN: [id number] Date of Birth: 1925-01-17  Transition of Care [facility]) CM/SW Contact  Sharin Mons, RN Phone Number: 03/23/2020, 4:31 PM  Clinical Narrative:     Pt without SNF bed offers.... TOC team will continue to monitor and assist with TOC needs.   Expected Discharge Plan: Skilled Nursing Facility Barriers to Discharge: No SNF bed  Expected Discharge Plan and Services Expected Discharge Plan: [place]                                               Social Determinants of Health (SDOH) Interventions    Readmission Risk Interventions No flowsheet data found.

## 2020-03-23 NOTE — Progress Notes (Signed)
   Subjective:   Patient has no particular complaints this morning.  Denies shortness of breath, chest pain, difficulty breathing.  States he has a good appetite.  Objective:  Vital signs in last 24 hours: Vitals:   03/22/20 0715 03/22/20 0855 03/22/20 1500 03/22/20 2100  BP:  (!) 155/91 128/75 (!) 157/96  Pulse:  77 78 94  Resp:  18 18   Temp:  98.2 F (36.8 C) 98.4 F (36.9 C) 98.4 F (36.9 C)  TempSrc:  Oral Oral Oral  SpO2: 97% 93% 99% 94%  Weight:      Height:        Physical Exam Constitutional: no acute distress, very hard of hearing Head: atraumatic ENT: external ears normal Eyes: EOMI Cardiovascular: regular rate and rhythm, normal heart sounds, no edema Pulmonary: effort normal, mild expiratory wheezing to lower lung fields bilaterally, improved from yesterday  Abdominal: flat, nontender, no rebound tenderness, bowel sounds normal Skin: warm and dry Neurological: alert, no focal deficit, speech is difficult to understand but he is oriented x3 Psychiatric: normal mood and affect  Assessment/Plan: [name] is a 87 y.o. male with hx of CKD 3, CVA, HFpEF, hypertension, hyperlipidemia, recurrent UTIs, meningioma presenting with generalized fatigue and confusion.  Has a positive COVID-19 test, has had vaccination x2 and relatively mild symptoms.  Principal Problem:   COVID-19 Active Problems:   HTN (hypertension)   Chronic diastolic (congestive) heart failure (HCC)   Weakness  COVID-19 infection Wheezing Generalized weakness COVID-19 test positive.  Chest x-ray without pneumonia. CRP 1.3 < 2.3.  No leukocytosis.  Symptoms started 1 week prior to admission.  Now on room air saturating well.  He at baseline requires assistance with most of his ADLs, but is reportedly weak beyond his baseline at this time.  Pending placement to SNF which will accept COVID patients.   -continue Decadron, day 4 -continue albuterol every 6 hours -now on room air, may restart O2  if needed -Urinalysis not obtained due to incontinence -SNF versus home health PT, follow-up with daughter about results of her Covid test  Chronic HFpEF Hypertension Echo in July 2020 with EF 60 to 65% and impaired LV relaxation, LA size severely dilated.  No sign of acute exacerbation. -Continue home Lasix 40 mg daily -Continue home amlodipine 5 mg  AKI on CKD 3 Creatinine 1.13 < 1.23 < 1.33, which is at his baseline. -Daily BMP -Avoid nephrotoxins  Hx CVA, vascular dementia HLD -Continue home lipitor 20mg  -BP control as above  Diet:  Heart healthy IVF:  none VTE:  heparin Prior to Admission Living Arrangement:  home Anticipated Discharge Location:  SNF vs home with [facility] PT Barriers to Discharge:  PT needs Dispo: Medically stable for discharge, pending SNF placement for this COVID positive patient  Andrew Au, MD 03/23/2020, 6:36 AM Pager: [phone] After 5pm on weekdays and 1pm on weekends: On Call pager [phone]

## 2020-03-23 NOTE — Plan of Care (Signed)

## 2020-03-24 DIAGNOSIS — U071 COVID-19: Secondary | ICD-10-CM | POA: Diagnosis not present

## 2020-03-24 LAB — BASIC METABOLIC PANEL
Anion gap: 9 (ref 5–15)
BUN: 33 mg/dL — ABNORMAL HIGH (ref 8–23)
CO2: 29 mmol/L (ref 22–32)
Calcium: 10.3 mg/dL (ref 8.9–10.3)
Chloride: 101 mmol/L (ref 98–111)
Creatinine, Ser: 1.1 mg/dL (ref 0.61–1.24)
GFR, Estimated: >60 mL/min (ref >60)
Glucose, Bld: 120 mg/dL — ABNORMAL HIGH (ref 70–99)
Potassium: 4.4 mmol/L (ref 3.5–5.1)
Sodium: 139 mmol/L (ref 135–145)

## 2020-03-24 LAB — CBC WITH DIFFERENTIAL/PLATELET
Abs Immature Granulocytes: 0.28 10*3/uL — ABNORMAL HIGH (ref 0.00–0.07)
Basophils Absolute: 0 10*3/uL (ref 0.0–0.1)
Basophils Relative: 0 %
Eosinophils Absolute: 0 10*3/uL (ref 0.0–0.5)
Eosinophils Relative: 0 %
HCT: 39.1 % (ref 39.0–52.0)
Hemoglobin: 13.2 g/dL (ref 13.0–17.0)
Immature Granulocytes: 4 %
Lymphocytes Relative: 12 %
Lymphs Abs: 0.9 10*3/uL (ref 0.7–4.0)
MCH: 31 pg (ref 26.0–34.0)
MCHC: 33.8 g/dL (ref 30.0–36.0)
MCV: 91.8 fL (ref 80.0–100.0)
Monocytes Absolute: 0.8 10*3/uL (ref 0.1–1.0)
Monocytes Relative: 10 %
Neutro Abs: 5.5 10*3/uL (ref 1.7–7.7)
Neutrophils Relative %: 74 %
Platelets: 141 10*3/uL — ABNORMAL LOW (ref 150–400)
RBC: 4.26 MIL/uL (ref 4.22–5.81)
RDW: 13.6 % (ref 11.5–15.5)
WBC: 7.4 10*3/uL (ref 4.0–10.5)
nRBC: 0 % (ref 0.0–0.2)

## 2020-03-24 LAB — C-REACTIVE PROTEIN: CRP: 0.6 mg/dL (ref <1.0)

## 2020-03-24 MED ORDER — ALBUTEROL SULFATE HFA 108 (90 BASE) MCG/ACT IN AERS
1.0000 | INHALATION_SPRAY | Freq: Three times a day (TID) | RESPIRATORY_TRACT | Status: DC
  Administered 2020-03-24: 2 via RESPIRATORY_TRACT

## 2020-03-24 NOTE — Plan of Care (Signed)

## 2020-03-24 NOTE — Progress Notes (Signed)
   Subjective:   Patient's hearing aid battery has run out, so he is more difficult to communicate this morning. States that his breathing feels fine. Denies pain. Does not seem to endorse any other acute complaints.   Objective:  Vital signs in last 24 hours: Vitals:   03/23/20 0828 03/23/20 1702 03/23/20 2144 03/24/20 0600  BP: (!) 150/93 (!) 166/97 (!) 150/90 (!) 145/91  Pulse: 70 71 63 69  Resp: 18 18 17 19   Temp: 98.2 F (36.8 C) 98.4 F (36.9 C) 98.3 F (36.8 C) 97.6 F (36.4 C)  TempSrc: Oral Oral Oral Oral  SpO2: 94% 99% 97% 96%  Weight:      Height:        Physical Exam Constitutional: no acute distress, very hard of hearing Head: atraumatic ENT: external ears normal Eyes: EOMI Cardiovascular: regular rate and rhythm, normal heart sounds, no edema Pulmonary: effort normal, mild expiratory wheezing to lower lung fields bilaterally, improved from yesterday  Abdominal: flat, nontender, no rebound tenderness, bowel sounds normal Skin: warm and dry Neurological: alert, no focal deficit, speech is difficult to understand but he is oriented x3 Psychiatric: normal mood and affect  Assessment/Plan: [name] is a 87 y.o. male with hx of CKD 3, CVA, HFpEF, hypertension, hyperlipidemia, recurrent UTIs, meningioma presenting with generalized fatigue and confusion.  Has a positive COVID-19 test, has had vaccination x2 and relatively mild symptoms.  Principal Problem:   COVID-19 Active Problems:   HTN (hypertension)   Chronic diastolic (congestive) heart failure (HCC)   Weakness  COVID-19 infection Wheezing Generalized weakness COVID-19 test positive.  Chest x-ray without pneumonia. CRP 1.3 < 2.3.  No leukocytosis.  Symptoms started 1 week prior to admission.  Now on room air and saturating well.  He at baseline requires assistance with most of his ADLs, but is reportedly weak beyond his baseline at this time.  Pending placement to SNF which will accept COVID  patients.   -continue Decadron, day 5 -continue albuterol TID -Urinalysis not obtained due to incontinence -SNF versus home health PT, follow-up with daughter about results of her Covid test  Chronic HFpEF Hypertension Echo in July 2020 with EF 60 to 65% and impaired LV relaxation, LA size severely dilated.  No sign of acute exacerbation. -Continue home Lasix 40 mg daily -Continue home amlodipine 5 mg  AKI on CKD 3 - resolved Creatinine 0.96 < 1.13 < 1.23 < 1.33. BUN has remained mildly elevated at 33 today despite improvement in creatinine. -Daily BMP -Avoid nephrotoxins  Hx CVA, vascular dementia HLD -Continue home lipitor 20mg  -BP control as above  Diet:  Heart healthy IVF:  none VTE:  heparin Prior to Admission Living Arrangement:  home Anticipated Discharge Location:  SNF Barriers to Discharge:  SNF placement Dispo: Medically stable for discharge, pending SNF placement for this COVID positive patient  Andrew Au, MD 03/24/2020, 6:49 AM Pager: [phone] After 5pm on weekdays and 1pm on weekends: On Call pager [phone]

## 2020-03-24 NOTE — Discharge Instructions (Signed)
[name], [name] were hospitalized due to generalized weakness likely due to your COVID 19 infection. We recommend that you go to a skilled nursing facility to work on increasing your strength. I want you to continue taking your medications as prescribed. Please follow up with your PCP in 1-2 weeks.   Thank you for allowing [place] to be a part of your care!

## 2020-03-24 NOTE — Care Management Important Message (Signed)
Important Message  Patient Details  Name: [name] MRN: [id number] Date of Birth: Mar 13, 1925   Medicare Important Message Given:  Yes - Important Message mailed due to current National Emergency   Verbal consent obtained due to current National Emergency  Relationship to patient: Self Contact Name: Monterius Rolf Call Date: 03/24/20  Time: 1428 Phone: [phone] Outcome: No Answer/Busy Important Message mailed to: Patient address on file    Delorse Lek 03/24/2020, 2:28 PM

## 2020-03-24 NOTE — Discharge Summary (Signed)
Name: [name] MRN: [id number] DOB: [birth year] 87 y.o. PCP: [name], DO  Date of Admission: 03/19/2020  1:46 PM Date of Discharge:  03/26/20 Attending Physician: Axel Filler, *  Discharge Diagnosis: 1. Generalized weakness 2. COVID-19  Discharge Medications: Allergies as of 03/26/2020   No Known Allergies     Medication List    TAKE these medications   acetaminophen 500 MG tablet Commonly known as: TYLENOL Take 500 mg by mouth every 6 (six) hours as needed for fever.   amLODipine 5 MG tablet Commonly known as: NORVASC Take 1 tablet (5 mg total) by mouth daily.   aspirin 81 MG EC tablet Take 1 tablet (81 mg total) by mouth daily.   atorvastatin 20 MG tablet Commonly known as: LIPITOR Take 1 tablet (20 mg total) by mouth daily. What changed: when to take this   furosemide 40 MG tablet Commonly known as: LASIX Take 2 tablets (80 mg total) by mouth daily. What changed: how much to take   levETIRAcetam 500 MG 24 hr tablet Commonly known as: KEPPRA XR Take 2 tablets (1,000 mg total) by mouth at bedtime. What changed: how much to take   triamcinolone ointment 0.1 % Commonly known as: KENALOG Apply 1 application topically 2 (two) times daily as needed (For leg rash).       Disposition and follow-up:   [name] was discharged from [facility] in Stable condition.  At the hospital follow up visit please address:  1.  Follow-up on:  Deconditioning- patient presented with generalized weakness due to his covid infection. Discharged to SNF to help improve this.   2.  Labs / imaging needed at time of follow-up: none  3.  Pending labs/ test needing follow-up: none  Follow-up Appointments:  Contact information for after-discharge care    Destination    [facility] Preferred SNF .   Service: Skilled Nursing Contact information: [address]  [place] [facility] Course by problem list:  Generalized weakness COVID-19 Patient presented to the hospital due to generalized weakness 2/2 covid 19. He tested positive on 03/19/2020. He initially required 2L supplemental oxygen however was transitioned quickly to room air, saturating well. He was treated with dexamethasone during his hospital stay and some albuterol. PT/OT recommended SNF to increase patient's strength and balance.   Discharge Vitals:   BP 104/72 (BP Location: Left Arm)   Pulse (!) 103   Temp 99.2 F (37.3 C) (Oral)   Resp 18   Ht 5\' 10"  (1.778 m)   Wt 92.8 kg   SpO2 93%   BMI 29.36 kg/m   Pertinent Labs, Studies, and Procedures:   CBC Latest Ref Rng & Units 03/25/2020 03/24/2020 03/23/2020  WBC 4.0 - 10.5 K/uL 8.2 7.4 6.7  Hemoglobin 13.0 - 17.0 g/dL 13.1 13.2 12.9(L)  Hematocrit 39.0 - 52.0 % 41.7 39.1 40.7  Platelets 150 - 400 K/uL 137(L) 141(L) 131(L)   BMP Latest Ref Rng & Units 03/25/2020 03/24/2020 03/23/2020  Glucose 70 - 99 mg/dL 167(H) 120(H) 118(H)  BUN 8 - 23 mg/dL 31(H) 33(H) 29(H)  Creatinine 0.61 - 1.24 mg/dL 1.17 1.10 1.13  BUN/Creat Ratio 10 - 24 - - -  Sodium 135 - 145 mmol/L 139 139 139  Potassium 3.5 - 5.1 mmol/L 4.9 4.4 4.2  Chloride 98 - 111 mmol/L  101 101 101  CO2 22 - 32 mmol/L 29 29 30   Calcium 8.9 - 10.3 mg/dL 10.3 10.3 10.4(H)    CT HEAD WO CONTRAST  Result Date: 03/11/2020 CLINICAL DATA:  Difficulty standing and walking. Leans to the right. Symptoms began on Tuesday. EXAM: CT HEAD WITHOUT CONTRAST TECHNIQUE: Contiguous axial images were obtained from the base of the skull through the vertex without intravenous contrast. COMPARISON:  CT head 08/11/2019.  MRI brain 08/10/2019 FINDINGS: Brain: Diffuse cerebral atrophy. Ventricular dilatation consistent with central atrophy. Low-attenuation changes throughout the deep white matter consistent with small vessel ischemia. Focal areas of encephalomalacia in  the right frontal and occipital lobes as well as in the right cerebellum. No change in distribution since the previous study consistent with old infarcts. Calcified meningioma along the left sphenoid wing is unchanged. No mass-effect or midline shift. No abnormal extra-axial fluid collections. Basal cisterns are not effaced. No acute intracranial hemorrhage. Vascular: Mild intracranial arterial vascular calcifications. Skull: The calvarium appears intact. Sinuses/Orbits: Paranasal sinuses and mastoid air cells are clear. Other: None. IMPRESSION: 1. No acute intracranial abnormalities. 2. Chronic atrophy and small vessel ischemia. 3. Focal areas of encephalomalacia in the right frontal and occipital lobes as well as in the right cerebellum consistent with old infarcts. 4. Calcified meningioma along the left sphenoid wing is unchanged. Electronically Signed   By: Lucienne Capers M.D.   On: 03/11/2020 15:29   MR Brain W and Wo Contrast  Result Date: 03/11/2020 CLINICAL DATA:  Headache.  Intracranial hemorrhage suspected. EXAM: MRI HEAD WITHOUT AND WITH CONTRAST TECHNIQUE: Multiplanar, multiecho pulse sequences of the brain and surrounding structures were obtained without and with intravenous contrast. CONTRAST:  40mL GADAVIST GADOBUTROL 1 MMOL/ML IV SOLN COMPARISON:  Head CT March 11, 2020. FINDINGS: Brain: No acute infarction, hemorrhage, hydrocephalus or extra-axial collection. Large area of encephalomalacia and gliosis involving most of the right cerebellar hemisphere and right middle and superior cerebellar peduncles with volume loss of the pons and medulla. Area of encephalomalacia and gliosis also seen in the bilateral occipital lobes, right greater than left and right frontal lobe. Remote lacunar infarcts in the bilateral cerebellar hemispheres. Scattered hemosiderin deposits are seen within the left cerebellar hemisphere, pons and bilateral occipital lobes as well as right precentral sulcus. Extensive  confluent T2 hyperintensity within the white matter of the cerebral hemispheres, nonspecific, most likely related to chronic microangiopathic changes. Left frontal, partially calcified measuring approximately 2.4 by 1.8 by 2.5 cm with mass effect on the inferior left frontal lobe. No abnormal parenchymal contrast enhancement. Vascular: Normal flow voids. Skull and upper cervical spine: Normal marrow signal. Sinuses/Orbits: Right lens surgery. Trace mucosal thickening of the ethmoid cells. IMPRESSION: 1. No acute intracranial abnormality. 2. Large area of encephalomalacia and gliosis involving most of the right cerebellar hemisphere and right middle and superior cerebellar peduncles with volume loss of the pons and medulla. 3. Areas of encephalomalacia and gliosis in the bilateral occipital lobes, right greater than left and right frontal lobe. Remote lacunar infarcts in the bilateral cerebellar hemispheres. 4. Scattered hemosiderin deposits within the left cerebellar hemisphere, pons and bilateral occipital lobes as well as right precentral sulcus. 5. Advanced chronic white matter disease. Electronically Signed   By: Pedro Earls M.D.   On: 03/11/2020 19:16   DG Chest Portable 1 View  Result Date: 03/19/2020 CLINICAL DATA:  Per ED Triage Note: "Pt brought to ED by [facility] from home for fever, increase cough and fatigue for the past  2 days, recently exposed by family to covid. Pt is vaccinated."Hx of HTN EXAM: PORTABLE CHEST 1 VIEW COMPARISON:  03/11/2020. FINDINGS: Cardiac silhouette is mildly enlarged. No mediastinal or hilar masses. Lungs are clear.  No pleural effusion or pneumothorax. Skeletal structures are grossly intact. IMPRESSION: No active disease. Electronically Signed   By: Lajean Manes M.D.   On: 03/19/2020 14:38   DG Chest Portable 1 View  Result Date: 03/11/2020 CLINICAL DATA:  Weakness. EXAM: PORTABLE CHEST 1 VIEW COMPARISON:  Chest x-ray dated April 13, 2019. FINDINGS: The  heart size and mediastinal contours are within normal limits. Both lungs are clear. The visualized skeletal structures are unremarkable. IMPRESSION: No active disease. Electronically Signed   By: Titus Dubin M.D.   On: 03/11/2020 16:19     Discharge Instructions: Discharge Instructions    Call MD for:  difficulty breathing, headache or visual disturbances   Complete by: As directed    Call MD for:  extreme fatigue   Complete by: As directed    Call MD for:  persistant dizziness or light-headedness   Complete by: As directed    Call MD for:  persistant nausea and vomiting   Complete by: As directed    Call MD for:  redness, tenderness, or signs of infection (pain, swelling, redness, odor or green/yellow discharge around incision site)   Complete by: As directed    Call MD for:  temperature >100.4   Complete by: As directed    Diet - low sodium heart healthy   Complete by: As directed    Increase activity slowly   Complete by: As directed      Mr. Neziah, Mathieu were hospitalized due to generalized weakness likely due to your COVID 19 infection. We recommend that you go to a skilled nursing facility to work on increasing your strength. I want you to continue taking your medications as prescribed. Please follow up with your PCP in 1-2 weeks.   Thank you for allowing [place] to be a part of your care!  Signed: Andrew Au, MD 03/26/2020, 3:43 PM

## 2020-03-24 NOTE — TOC Progression Note (Signed)
Transition of Care [facility]) - Progression Note    Patient Details  Name: SPYRIDON HORNSTEIN MRN: [id number] Date of Birth: 03/18/1924  Transition of Care [facility] CM/SW Contact  Sharin Mons, RN Phone Number: 03/24/2020, 1:17 PM  Clinical Narrative:    SNF bed offers noted and shared with daughter Roechelle. Roechelle selected [facility]. Per [facility] admission lliaison bed will bed available on tomorrow. NCM made daughter, nurse and MD aware.  TOC team will continue to monitor and follow for needs.....   Expected Discharge Plan: Skilled Nursing Facility Barriers to Discharge: No SNF bed  Expected Discharge Plan and Services Expected Discharge Plan: [place]                                               Social Determinants of Health (SDOH) Interventions    Readmission Risk Interventions No flowsheet data found.

## 2020-03-25 LAB — CBC
HCT: 41.7 % (ref 39.0–52.0)
Hemoglobin: 13.1 g/dL (ref 13.0–17.0)
MCH: 29.2 pg (ref 26.0–34.0)
MCHC: 31.4 g/dL (ref 30.0–36.0)
MCV: 93.1 fL (ref 80.0–100.0)
Platelets: 137 10*3/uL — ABNORMAL LOW (ref 150–400)
RBC: 4.48 MIL/uL (ref 4.22–5.81)
RDW: 13.7 % (ref 11.5–15.5)
WBC: 8.2 10*3/uL (ref 4.0–10.5)
nRBC: 0 % (ref 0.0–0.2)

## 2020-03-25 LAB — BASIC METABOLIC PANEL
Anion gap: 9 (ref 5–15)
BUN: 31 mg/dL — ABNORMAL HIGH (ref 8–23)
CO2: 29 mmol/L (ref 22–32)
Calcium: 10.3 mg/dL (ref 8.9–10.3)
Chloride: 101 mmol/L (ref 98–111)
Creatinine, Ser: 1.17 mg/dL (ref 0.61–1.24)
GFR, Estimated: 58 mL/min — ABNORMAL LOW (ref >60)
Glucose, Bld: 167 mg/dL — ABNORMAL HIGH (ref 70–99)
Potassium: 4.9 mmol/L (ref 3.5–5.1)
Sodium: 139 mmol/L (ref 135–145)

## 2020-03-25 MED ORDER — ALBUTEROL SULFATE HFA 108 (90 BASE) MCG/ACT IN AERS: 1.0000 | INHALATION_SPRAY | RESPIRATORY_TRACT | Status: DC | PRN

## 2020-03-25 MED ORDER — ENOXAPARIN SODIUM 40 MG/0.4ML ~~LOC~~ SOLN
40.0000 mg | SUBCUTANEOUS | Status: DC
  Administered 2020-03-25 – 2020-03-26 (×2): 40 mg via SUBCUTANEOUS
  Filled 2020-03-25 (×2): qty 0.4

## 2020-03-25 NOTE — Progress Notes (Signed)
   Subjective:   Patient's hearing aid battery has run out, so he is more difficult to communicate this morning. Seems to deny pain or difficulty breathing. Asks for his milk which he is drinking without aspiration.  Objective:  Vital signs in last 24 hours: Vitals:   03/24/20 1500 03/24/20 2024 03/24/20 2104 03/25/20 0300  BP: (!) 163/94 136/87  125/80  Pulse: 74 80  77  Resp: 18 18  19   Temp: 98.1 F (36.7 C) 98.3 F (36.8 C)  98.1 F (36.7 C)  TempSrc: Oral Oral  Oral  SpO2: 98% 98% 98% 91%  Weight:      Height:        Physical Exam Constitutional: no acute distress, very hard of hearing Head: atraumatic ENT: external ears normal Eyes: EOMI Cardiovascular: regular rate and rhythm, normal heart sounds, no edema Pulmonary: effort normal, clear to ascultation bilaterally Abdominal: flat, nontender, no rebound tenderness, bowel sounds normal Skin: warm and dry Neurological: alert, no focal deficit, speech is difficult to understand but he is oriented x3 Psychiatric: normal mood and affect  Assessment/Plan: [name] is a 87 y.o. male with hx of CKD 3, CVA, HFpEF, hypertension, hyperlipidemia, recurrent UTIs, meningioma presenting with generalized fatigue and confusion.  Has a positive COVID-19 test, has had vaccination x2 and relatively mild symptoms.  Principal Problem:   COVID-19 Active Problems:   HTN (hypertension)   Chronic diastolic (congestive) heart failure (HCC)   Weakness  COVID-19 infection Wheezing - resolved Generalized weakness COVID-19 test positive.  Chest x-ray without pneumonia. CRP 1.3 < 2.3.  No leukocytosis.  Symptoms started 1 week prior to admission.  Now on room air and saturating well.  He at baseline requires assistance with most of his ADLs, but is reportedly weak beyond his baseline at this time.  Pending placement to SNF which will accept COVID patients.   -completed 5 days of Decadron -switch albuterol to prn -pending  SNF  Chronic HFpEF Hypertension Echo in July 2020 with EF 60 to 65% and impaired LV relaxation, LA size severely dilated.  No sign of acute exacerbation. -Continue home Lasix 40 mg daily -Continue home amlodipine 5 mg  Hx CVA, vascular dementia HLD -Continue home lipitor 20mg  -BP control as above  CKD 3, AKI resolved   Diet:  Heart healthy IVF:  none VTE:  lovenox Prior to Admission Living Arrangement:  home Anticipated Discharge Location:  SNF Barriers to Discharge:  SNF placement Dispo: Medically stable for discharge, pending SNF placement for this COVID positive patient  Andrew Au, MD 03/25/2020, 6:39 AM Pager: [phone] After 5pm on weekdays and 1pm on weekends: On Call pager [phone]

## 2020-03-25 NOTE — Progress Notes (Signed)
Occupational Therapy Treatment Patient Details Name: [name] MRN: [id number] DOB: 09-13-1924 Today's Date: 03/25/2020    History of present illness Pt admitted with 1 week a fatigue, generalized weakness, disorientation and fever. COVID 19 positive. PMH: CKD 3, CVA, CHF, HTN, HLD, recurrent UTI, meningioma, BPH.   OT comments  Pt making progress with functional goals. Pt HOH (provided new battery for hearing aid) and difficult to follow commands vs cognition. Pt required min A +2 sup - sit to EOB with verbal and visual cues to use bed rail, mod - min A to scoot hips to EOB using pads underneath pt. Pt washed hands and face seated at EOB with min guard A. Pt stood from EOB with RW max A +2 with posterior lean and LEs against bed. Unable to transfer to [facility] but was able to SPT to recliner max - total A +2 with cues for safety and sequencing. Pt set up with dinner tray seated in recliner. Notified RN to assist pt back to with [facility] or two person A using RW. OT will continue to follow acutely to maximize level of function and safety  Follow Up Recommendations  SNF;Supervision/Assistance - 24 hour    Equipment Recommendations  3 in 1 bedside commode    Recommendations for Other Services      Precautions / Restrictions Precautions Precautions: Fall Restrictions Weight Bearing Restrictions: No       Mobility Bed Mobility Overal bed mobility: Needs Assistance Bed Mobility: Supine to Sit     Supine to sit: Min assist;+2 for physical assistance     General bed mobility comments: multimodal cues to sequence, assist for LEs over EOB and to elevate trunk with pt using bed rail ,mod - min A  to advance hips to EOB  Transfers Overall transfer level: Needs assistance Equipment used: Rolling walker (2 wheeled) Transfers: Sit to/from [name] Sit to Stand: Max assist;+2 physical assistance Stand pivot transfers: Total assist;+2 physical assistance       General  transfer comment: posterior lean, assist to rise and steady; difficulty keeping weight anteriorly over his feet; opted to perform basic pivot transfer with +2 assist    Balance Overall balance assessment: Needs assistance   Sitting balance-Leahy Scale: Fair   Postural control: Posterior lean Standing balance support: Bilateral upper extremity supported Standing balance-Leahy Scale: Poor                             ADL either performed or assessed with clinical judgement   ADL Overall ADL's : Needs assistance/impaired Eating/Feeding: Set up;Sitting Eating/Feeding Details (indicate cue type and reason): seated in recliner Grooming: Wash/dry hands;Wash/dry face;Min Systems developer: +2 for safety/equipment;Stand-pivot;RW;Maximal assistance;Cueing for safety;Cueing for sequencing Armed forces technical officer Details (indicate cue type and reason): simulated to recliner Toileting- Clothing Manipulation and Hygiene: Total assistance;Sit to/from stand       Functional mobility during ADLs: Maximal assistance;Total assistance;+2 for physical assistance;Cueing for safety;Cueing for sequencing;Rolling walker       Vision Baseline Vision/History: Cataracts;Wears glasses Patient Visual Report: No change from baseline     Perception     Praxis      Cognition Arousal/Alertness: Awake/alert Behavior During Therapy: WFL for tasks assessed/performed Overall Cognitive Status: Impaired/Different from baseline Area of Impairment: Following commands;Problem solving;Memory;Orientation  Following Commands: Follows one step commands with increased time;Follows one step commands inconsistently     Problem Solving: Slow processing;Decreased initiation;Difficulty sequencing;Requires verbal cues;Requires tactile cues General Comments: significant hearing impairment, difficulty assessing cognition        Exercises      Shoulder Instructions       General Comments      Pertinent Vitals/ Pain       Pain Assessment: Faces Faces Pain Scale: No hurt  Home Living Family/patient expects to be discharged to:: Private residence Living Arrangements: Children Available Help at Discharge: Family;Available 24 hours/day Type of Home: House Home Access: Ramped entrance     Home Layout: One level     Bathroom Shower/Tub: Tub/shower unit;Curtain                    Prior Functioning/Environment              Frequency  Min 3X/week        Progress Toward Goals  OT Goals(current goals can now be found in the care plan section)  Progress towards OT goals: Progressing toward goals     Plan Discharge plan remains appropriate    Co-evaluation    PT/OT/SLP Co-Evaluation/Treatment: Yes Reason for Co-Treatment: For patient/therapist safety;To address functional/ADL transfers   OT goals addressed during session: ADL's and self-care;Proper use of Adaptive equipment and DME      AM-PAC OT "6 Clicks" Daily Activity     Outcome Measure   Help from another person eating meals?: None Help from another person taking care of personal grooming?: A Little Help from another person toileting, which includes using toliet, bedpan, or urinal?: Total Help from another person bathing (including washing, rinsing, drying)?: Total Help from another person to put on and taking off regular upper body clothing?: A Little Help from another person to put on and taking off regular lower body clothing?: Total 6 Click Score: 13    End of Session Equipment Utilized During Treatment: Gait belt;Rolling walker  OT Visit Diagnosis: Unsteadiness on feet (R26.81);Other abnormalities of gait and mobility (R26.89);Muscle weakness (generalized) (M62.81);Other symptoms and signs involving cognitive function   Activity Tolerance Patient tolerated treatment well   Patient Left in chair;with call bell/phone within  reach;with chair alarm set   Nurse Communication Mobility status        Time: 9476-5465 OT Time Calculation (min): 27 min  Charges: OT General Charges $OT Visit: 1 Visit OT Treatments $Self Care/Home Management : 8-22 mins    Britt Bottom 03/25/2020, 5:09 PM

## 2020-03-25 NOTE — TOC Progression Note (Signed)
Transition of Care [facility]) - Progression Note    Patient Details  Name: YUNIS VOORHEIS MRN: [id number] Date of Birth: 21-Nov-1924  Transition of Care [facility]) CM/SW Contact  Sharin Mons, RN Phone Number: 03/25/2020, 11:31 AM  Clinical Narrative:    Received text from Perrin Smack / [facility] admission liaison. Kitty informed NCM SNF bed will be available on tomorrow...  TOC team will continue to monitor and assist with needs.   Expected Discharge Plan: [place] [facility] SNF) Barriers to Discharge: No SNF bed  Expected Discharge Plan and Services Expected Discharge Plan: [place] [facility] SNF)                                               Social Determinants of Health (SDOH) Interventions    Readmission Risk Interventions No flowsheet data found.

## 2020-03-25 NOTE — Progress Notes (Signed)
Physical Therapy Treatment Patient Details Name: [name] MRN: [id number] DOB: Jan 03, 1925 Today's Date: 03/25/2020    History of Present Illness Pt admitted with 1 week a fatigue, generalized weakness, disorientation and fever. COVID 19 positive. PMH: CKD 3, CVA, CHF, HTN, HLD, recurrent UTI, meningioma, BPH.    PT Comments    Pt supine on arrival, pleasantly agreeable to therapy session, with good participation and tolerance for mobility. Hearing aide battery obtained for pt prior to OOB and pt followed 1-step commands with increased time and multimodal cues needed due to cognitive vs hearing deficit. Pt performed bed mobility with decreased assist this session compared with previous session (minA +2) but needs mod/maxA for seated scooting, and +69maxA for transfers, up to [place] for dynamic standing tasks. Pt HR elevated up to 125 bpm during transfers but SpO2 reading WNL per dinamap. Pt performed supine/seated BLE A/AAROM therapeutic exercises with good tolerance as detailed below. Pt will continue to benefit from skilled rehab in a post acute setting to maximize functional gains before returning home.   Follow Up Recommendations  SNF;Supervision/Assistance - 24 hour     Equipment Recommendations  Rolling walker with 5" wheels;3in1 (PT);Wheelchair (measurements PT) (if home instead of SNF, consider mechanical lift and hospital bed)    Recommendations for Other Services       Precautions / Restrictions Precautions Precautions: Fall Precaution Comments: HoH Restrictions Weight Bearing Restrictions: No    Mobility  Bed Mobility Overal bed mobility: Needs Assistance Bed Mobility: Supine to Sit     Supine to sit: +2 for physical assistance;Min assist     General bed mobility comments: multimodal cues to sequence, assist for LEs over EOB, to raise trunk; needs mod/maxA for anterior scooting to EOB  Transfers Overall transfer level: Needs assistance Equipment used: Rolling  walker (2 wheeled) Transfers: Sit to/from [name] Sit to Stand: Mod assist;Max assist;+2 physical assistance;From elevated surface Stand pivot transfers: +2 physical assistance;Max assist       General transfer comment: posterior lean, assist to rise and steady; difficulty keeping weight anteriorly over his feet; opted to perform basic pivot transfer with +2 assist, pt trying to sit prior to BLE proximity to chair  Ambulation/Gait Ambulation/Gait assistance: Max assist;+2 physical assistance;+2 safety/equipment Gait Distance (Feet): 3 Feet Assistive device: Rolling walker (2 wheeled) Gait Pattern/deviations: Wide base of support;Trunk flexed;Leaning posteriorly;Shuffle;Step-to pattern     General Gait Details: Attempted to take steps, however difficulty with weight shifting, and keeping his weight anteriorly over his feet; 3-4 steps only while pivoting   Stairs             Wheelchair Mobility    Modified Rankin (Stroke Patients Only)       Balance Overall balance assessment: Needs assistance   Sitting balance-Leahy Scale: Fair Sitting balance - Comments: min guard for static sitting Postural control: Posterior lean Standing balance support: Bilateral upper extremity supported Standing balance-Leahy Scale: Zero Standing balance comment: modA for static standing, max/totalA for dynamic standing                            Cognition Arousal/Alertness: Awake/alert Behavior During Therapy: WFL for tasks assessed/performed Overall Cognitive Status: Impaired/Different from baseline Area of Impairment: Following commands;Problem solving;Memory;Orientation;Safety/judgement                     Memory: Decreased short-term memory Following Commands: Follows one step commands with increased time;Follows one step commands inconsistently (  needs multimodal cues) Safety/Judgement: Decreased awareness of deficits;Decreased awareness of  safety   Problem Solving: Slow processing;Decreased initiation;Difficulty sequencing;Requires verbal cues;Requires tactile cues General Comments: significant hearing impairment, difficulty assessing cognition; hearing aide battery obtained for pt prior to mobility      Exercises General Exercises - Lower Extremity Ankle Circles/Pumps: AROM;Strengthening;Both;10 reps;Seated Long Arc Quad: AROM;Strengthening;Both;10 reps;Seated Heel Slides: AROM;Strengthening;Both;10 reps;Supine Hip ABduction/ADduction: AROM;Strengthening;Both;10 reps;Supine Hip Flexion/Marching: AROM;Strengthening;Both;5 reps;Seated    General Comments General comments (skin integrity, edema, etc.): SpO2 monitored throughout, reading 90-95% on RA during mobility and HR elevated 100-125 bpm during pivot transfer      Pertinent Vitals/Pain Pain Assessment: Faces Faces Pain Scale: No hurt Pain Intervention(s): Monitored during session;Repositioned    Home Living Family/patient expects to be discharged to:: Private residence Living Arrangements: Children Available Help at Discharge: Family;Available 24 hours/day Type of Home: House Home Access: Ramped entrance   Home Layout: One level        Prior Function            PT Goals (current goals can now be found in the care plan section) Acute Rehab PT Goals Patient Stated Goal: Daughter is agreeable to SNF, wants to eventually have pt return home PT Goal Formulation: With patient/family Time For Goal Achievement: 04/03/20 Potential to Achieve Goals: Fair Progress towards PT goals: Progressing toward goals    Frequency    Min 2X/week      PT Plan Current plan remains appropriate;Frequency needs to be updated (per dept protocol)    Co-evaluation   Reason for Co-Treatment: For patient/therapist safety;To address functional/ADL transfers   OT goals addressed during session: ADL's and self-care;Proper use of Adaptive equipment and DME      AM-PAC PT  "6 Clicks" Mobility   Outcome Measure  Help needed turning from your back to your side while in a flat bed without using bedrails?: A Little Help needed moving from lying on your back to sitting on the side of a flat bed without using bedrails?: A Little Help needed moving to and from a bed to a chair (including a wheelchair)?: A Lot Help needed standing up from a chair using your arms (e.g., wheelchair or bedside chair)?: A Lot Help needed to walk in hospital room?: Total Help needed climbing 3-5 steps with a railing? : Total 6 Click Score: 12    End of Session Equipment Utilized During Treatment: Gait belt Activity Tolerance: Patient tolerated treatment well Patient left: in chair;with call bell/phone within reach;with chair alarm set Nurse Communication: Mobility status;Need for lift equipment (consider Maximove versus stedy with +2) PT Visit Diagnosis: Unsteadiness on feet (R26.81);Other abnormalities of gait and mobility (R26.89);Muscle weakness (generalized) (M62.81);History of falling (Z91.81);Difficulty in walking, not elsewhere classified (R26.2)     Time: 1191-4782 PT Time Calculation (min) (ACUTE ONLY): 26 min  Charges:  $Therapeutic Exercise: 8-22 mins                     Ngan Qualls P., PTA Acute Rehabilitation Services Pager: [phone] Office: [place] 03/25/2020, 5:21 PM

## 2020-03-26 DIAGNOSIS — U071 COVID-19: Secondary | ICD-10-CM | POA: Diagnosis not present

## 2020-03-26 NOTE — Progress Notes (Signed)
   Subjective:   Patient examined at bedside. He is very hard of hearing, worse since his hearing aid battery ran out. Does not endorse any acute complaints, but difficult to understand.  Objective:  Vital signs in last 24 hours: Vitals:   03/25/20 0804 03/25/20 1500 03/25/20 1900 03/26/20 0236  BP: (!) 141/91 (!) 142/85 (!) 142/91 126/81  Pulse: 76 100  81  Resp: 18 18 19 15   Temp: 98.1 F (36.7 C) 98.6 F (37 C) 98.6 F (37 C) 98.8 F (37.1 C)  TempSrc: Oral Oral Oral Oral  SpO2: 91% 91% 97% 94%  Weight:      Height:        Physical Exam Constitutional: no acute distress, very hard of hearing Head: atraumatic ENT: external ears normal Eyes: EOMI Cardiovascular: regular rate and rhythm, normal heart sounds, no edema Pulmonary: effort normal, clear to ascultation bilaterally Abdominal: flat, nontender, no rebound tenderness, bowel sounds normal Skin: warm and dry Neurological: alert, no focal deficit, speech is difficult to understand Psychiatric: normal mood and affect  Assessment/Plan: [name] is a 87 y.o. male with hx of CKD 3, CVA, HFpEF, hypertension, hyperlipidemia, recurrent UTIs, meningioma presenting with generalized fatigue and confusion.  Has a positive COVID-19 test, has had vaccination x2 and relatively mild symptoms.  Principal Problem:   COVID-19 Active Problems:   HTN (hypertension)   Chronic diastolic (congestive) heart failure (HCC)   Weakness  COVID-19 infection Wheezing - resolved Generalized weakness COVID-19 test positive.  Chest x-ray without pneumonia. CRP normalized.  No leukocytosis.  Symptoms started 1 week prior to admission.  Now on room air and saturating well.  He at baseline requires assistance with most of his ADLs, but is reportedly weak beyond his baseline at this time.  Pending placement to SNF which will accept COVID patients.   -completed 5 days of Decadron -albuterol prn -pending SNF  Chronic  HFpEF Hypertension Echo in July 2020 with EF 60 to 65% and impaired LV relaxation, LA size severely dilated.  No sign of acute exacerbation. -Continue home Lasix 40 mg daily -Continue home amlodipine 5 mg  Hx CVA, vascular dementia HLD -Continue home lipitor 20mg  -BP control as above  CKD 3, AKI resolved   Diet:  Heart healthy IVF:  none VTE:  lovenox Prior to Admission Living Arrangement:  home Anticipated Discharge Location:  SNF Barriers to Discharge:  SNF placement Dispo: Medically stable for discharge, pending SNF placement for this COVID positive patient  Andrew Au, MD 03/26/2020, 6:42 AM Pager: [phone] After 5pm on weekdays and 1pm on weekends: On Call pager [phone]

## 2020-03-26 NOTE — TOC Transition Note (Signed)
Transition of Care [facility]) - CM/SW Discharge Note   Patient Details  Name: LOGON UTTECH MRN: [id number] Date of Birth: June 13, 1924  [facility]) CM/SW Contact:  Sharin Mons, RN Phone Number: 03/26/2020, 3:46 PM   Clinical Narrative:    Patient will DC to: [facility] SNF Anticipated DC date: 03/26/2020 Family notified: Roechelle ( daughter) Transport by: Corey Harold   Per MD patient ready for DC today . RN, patient, patient's daughter, and facility notified of DC. Discharge Summary and FL2 sent to facility. RN to call report prior to discharge [phone]). DC packet on chart. Ambulance transport requested for patient.   RNCM will sign off for now as intervention is no longer needed. Please consult [place] again if new needs arise.    Final next level of care: Skilled Nursing Facility Barriers to Discharge: No Barriers Identified   Patient Goals and CMS Choice        Discharge Placement                       Discharge Plan and Services                                     Social Determinants of Health (SDOH) Interventions     Readmission Risk Interventions No flowsheet data found.

## 2020-03-26 NOTE — Plan of Care (Signed)
  Problem: Clinical Measurements: Goal: Respiratory complications will improve Outcome: Progressing   Problem: Activity: Goal: Risk for activity intolerance will decrease Outcome: Progressing   

## 2020-03-26 NOTE — Progress Notes (Signed)
Report given to [facility]. All questions answered. Pt belongings gathered to be sent with him.

## 2020-03-28 NOTE — Progress Notes (Signed)
   Discharge Summary: [facility]  [facility] is the home-based palliative care program of [facility] Discharge Reason: Discharged from hospital to SNF Admission Date: 09/16/2019 Discharge Date: 03/26/2020 Summary of [facility] Services: [name] was admitted to [place] with a diagnosis of Chronic Diastolic CHF Other medical history includes: Hypertension, Bradycardia, Dementia (mild), CVA late effect; ataxia, BPH, CKD III, UTI's Nursing and SW services were provided including goals of care discussions, teaching/assessment of medical conditions, discussion of available resources, medication teaching, coordination of care with medical providers and supportive counseling. CC nurse facilitated urine specimen collection when daughter had concerns about possible UTI. [name] presented to ED on 03/11/2020 with generalized weakness and was discharged after receiving IV fluids.  He was admitted to the hospital on 03/20/2020 with COVID and discharged to a SNF on 03/26/2020. [facility] does not provide services in SNF or ALF facilities. If [name] returns home, he would be eligible to be re-enrolled in the [facility] program. Advance Directives: DNR Jeanne Ivan, RN, MSN, [facility] [phone]

## 2020-03-30 ENCOUNTER — Encounter: Payer: Self-pay | Admitting: Internal Medicine

## 2020-03-30 ENCOUNTER — Non-Acute Institutional Stay (SKILLED_NURSING_FACILITY): Payer: Medicare Other | Admitting: Internal Medicine

## 2020-03-30 DIAGNOSIS — D696 Thrombocytopenia, unspecified: Secondary | ICD-10-CM | POA: Diagnosis not present

## 2020-03-30 DIAGNOSIS — N1831 Chronic kidney disease, stage 3a: Secondary | ICD-10-CM | POA: Diagnosis not present

## 2020-03-30 DIAGNOSIS — U071 COVID-19: Secondary | ICD-10-CM | POA: Diagnosis not present

## 2020-03-30 DIAGNOSIS — I10 Essential (primary) hypertension: Secondary | ICD-10-CM | POA: Diagnosis not present

## 2020-03-30 DIAGNOSIS — F0150 Vascular dementia without behavioral disturbance: Secondary | ICD-10-CM

## 2020-03-30 NOTE — Assessment & Plan Note (Signed)
03/24/2020 BIMS MMSE score 0/15. He can provide no meaningful history and denied any active symptoms.

## 2020-03-30 NOTE — Patient Instructions (Signed)
See assessment and plan under each diagnosis in the problem list and acutely for this visit 

## 2020-03-30 NOTE — Assessment & Plan Note (Signed)
Platelet count stable in the range of approximately 140,000.  No bleeding dyscrasias reported.  Patient does have past history of DVT.

## 2020-03-30 NOTE — Assessment & Plan Note (Addendum)
Blood pressure is actually soft; the antihypertensive regimen may need to be reduced if this persists.

## 2020-03-30 NOTE — Progress Notes (Signed)
NURSING HOME LOCATION:  [facility] ROOM NUMBER:  [id number]  CODE STATUS:  DNR  PCP: Joni Reining, DO  This is a comprehensive admission note to [place] performed on this date less than 30 days from date of admission. Included are preadmission medical/surgical history; reconciled medication list; family history; social history and comprehensive review of systems.  Corrections and additions to the records were documented. Comprehensive physical exam was also performed. Additionally a clinical summary was entered for each active diagnosis pertinent to this admission in the Problem List to enhance continuity of care.  HPI: Patient was hospitalized 1/7 - 03/26/2020 presenting with generalized weakness secondary to [place].  He exhibited difficulty standing or walking.   CT and MRI revealed diffuse cerebral atrophy with ventricular dilatation consistent with central atrophy. Deep white matter changes were consistent with small vessel ischemia.  There was also focal areas encephalomalacia in the right frontal and occipital lobes as well as in the right cerebellum.  Calcified meningioma along the left sphenoid wing was stable.   PCR testing was positive on 1/7.  Initially required 2 L supplemental oxygen but was quickly transitioned to room air with good saturations.  He received dexamethasone as well as bronchodilators.   Lab abnormalities included mild thrombocytopenia; platelet count was 137,000 at discharge.  He also had azotemia; BUN was 31 at discharge. Discharge vitals revealed a blood pressure 104/72, pulse 103, and temperature 99.2. Because of generalized weakness PT/OT recommended SNF placement for rehab.  Past medical and surgical history: Includes essential hypertension, dyslipidemia, seizure disorder, history of cerebellar stroke, history of left adrenal adrenal mass, history of DVT, CKD, and BPH.  Social history: Nondrinker, never smoked.  Family history: Is strongly  positive for hypertension and heart disease; it is noncontributory as he is 87 years old.   Review of systems:  Could not be completed due to dementia.  BIMS score 0 out of 15.  Therapist who completed the mental status testing stated that his answers were totally unrelated to the questions.  He told me  "I am all right".  He denied any symptoms especially infectious or pulmonary despite an intermittent cough.  He could not tell me whether he had been vaccinated for COVID.  Constitutional: No fever, significant weight change, fatigue  Eyes: No redness, discharge, pain, vision change ENT/mouth: No nasal congestion, purulent discharge, earache, change in hearing, sore throat  Cardiovascular: No chest pain, palpitations, paroxysmal nocturnal dyspnea, claudication, edema  Respiratory: No sputum production, hemoptysis, DOE, significant snoring, apnea  Gastrointestinal: No heartburn, dysphagia, abdominal pain, nausea /vomiting, rectal bleeding, melena, change in bowels Genitourinary: No dysuria, hematuria, pyuria, incontinence, nocturia Musculoskeletal: No joint stiffness, joint swelling, weakness, pain Dermatologic: No rash, pruritus, change in appearance of skin Neurologic: No dizziness, headache, syncope, seizures, numbness, tingling Psychiatric: No significant anxiety, depression, insomnia, anorexia Hematologic/lymphatic: No significant bruising, lymphadenopathy, abnormal bleeding  Physical exam:  Pertinent or positive findings: He was sitting in the wheelchair staring blankly ahead.  He is wearing nasal oxygen.  He has extensive alopecia.  Voice is hyponasal in quality.  He has a hearing aid on the left, yet has profound hearing loss. Proptosis is present, greater on the left.  He has dense arcus senilis.  He has intermittent exotropia of the left eye.  He is Geneticist, molecular.  S4 is present.  He has extensive, homogenous wet rhonchi in all lung fields.  He has cough was rhonchorous but nonproductive.   Pedal pulses are decreased.  When I  attempted to check strength; he grasped my hand.  When I palpated the feet for pulses; the left toe was upgoing.  He has slight contractures of fingers on the left.  Facial keratoses are present.  He has splotchy irregular hypopigmentation over the shins.  General appearance: Appears age; no acute distress, increased work of breathing is present despite cough.   Lymphatic: No lymphadenopathy about the head, neck, axilla. Eyes: No conjunctival inflammation or lid edema is present. There is no scleral icterus. Ears:  External ear exam shows no significant lesions or deformities.   Nose:  External nasal examination shows no deformity or inflammation. Nasal mucosa are pink and moist without lesions, exudates Oral exam: Lips and gums are healthy appearing.There is no oropharyngeal erythema or exudate. Neck:  No thyromegaly, masses, tenderness noted.    Heart:  No gallop, murmur, click, rub.  Lungs:  without wheezes,  rales, rubs. Abdomen: Bowel sounds are normal.  Abdomen is soft and nontender with no organomegaly, hernias, masses. GU: Deferred  Extremities:  No cyanosis, clubbing, edema. Neurologic exam: Balance, Rhomberg, finger to nose testing could not be completed due to clinical state Skin: Warm & dry w/o tenting.  See clinical summary under each active problem in the Problem List with associated updated therapeutic plan

## 2020-03-30 NOTE — Assessment & Plan Note (Addendum)
He exhibits diffuse rhonchi and nonproductive, juicy cough.  In the context of his vascular dementia he denies any infectious or pulmonary symptoms. Monitor O2 sats

## 2020-03-30 NOTE — Assessment & Plan Note (Addendum)
Creatinine 1.17 & GFR 58.  Avoid nephrotoxic drugs.

## 2020-04-06 ENCOUNTER — Non-Acute Institutional Stay (SKILLED_NURSING_FACILITY): Payer: Medicare HMO | Admitting: Adult Health

## 2020-04-06 ENCOUNTER — Encounter: Payer: Self-pay | Admitting: Adult Health

## 2020-04-06 DIAGNOSIS — I10 Essential (primary) hypertension: Secondary | ICD-10-CM

## 2020-04-06 DIAGNOSIS — R569 Unspecified convulsions: Secondary | ICD-10-CM | POA: Diagnosis not present

## 2020-04-06 DIAGNOSIS — F0150 Vascular dementia without behavioral disturbance: Secondary | ICD-10-CM

## 2020-04-06 DIAGNOSIS — I5032 Chronic diastolic (congestive) heart failure: Secondary | ICD-10-CM

## 2020-04-06 DIAGNOSIS — U071 COVID-19: Secondary | ICD-10-CM

## 2020-04-06 NOTE — Progress Notes (Signed)
Location:  [place] Room Number: [id number] Place of Service:  SNF (31) Provider:  Durenda Age, DNP, FNP-BC  Patient Care Team: [name], DO as PCP - General (Internal Medicine)  Extended Emergency Contact Information Primary Emergency Contact: Rembert,[name] Address: [address]          [place], [place] [address] Johnnette Litter of [place] Phone: [phone] Mobile Phone: [phone] Relation: Daughter Secondary Emergency Contact: Rembert,Raestarsha Address: [address]          [place], [place] [address] Johnnette Litter of [place] Phone: [phone] Mobile Phone: [phone] Relation: Granddaughter  Code Status:  DNR  Goals of care: Advanced Directive information Advanced Directives 03/30/2020  Does Patient Have a Medical Advance Directive? Yes  Type of Advance Directive Out of facility DNR (pink MOST or yellow form)  Does patient want to make changes to medical advance directive? No - Patient declined  Copy of [place] in Chart? -  Would patient like information on creating a medical advance directive? -  Pre-existing out of facility DNR order (yellow form or pink MOST form) -     Chief Complaint  Patient presents with  . Acute Visit    Patient seen a short-term rehabilitation visit.     HPI:  Pt is a 87 y.o. male seen today for a short-term rehabilitation visit.  He is a short-term rehabilitation resident of [facility].  He has a PMH of essential hypertension, dyslipidemia, seizure disorder, history of cerebellar stroke, history of left adrenal mass, history of DVT, chronic kidney disease and BPH. SBPs  ranging from 110 to 128. He takes  Amlodipine 5 mg daily for hypertension. No edema noted. He is on O2 @ 2L/min via [place] continuously. He takes Furosemide 80 mg daily for chronic diastolic heart failure. No seizure episode. He takes Levetiracetam 1,000 mg at bedtime for focal seizure  post stroke. He scored 0/15 on his BIMS which is ranging in severe cognitive impairment.  He was admitted to [place] on 03/26/20 post [facility] hospitalization 03/19/20 to 03/26/20 for generalized weakness due to COVID-19. He tested positive on 03/19/20. Chest x-ray was negative for pneumonia. He completed 5 days of Decadron.   Past Medical History:  Diagnosis Date  . Anemia   . BPH (benign prostatic hypertrophy)    TURP 05/19/13  . Cerebral embolism with cerebral infarction ([place]) 12/19/2013  . Chronic kidney disease    CHRONIC KIDNEY DISEASE, 2  . Difficulty hearing, right    BILATERAL HEARING LOSS - BEST TO TRY TO SPEAK INTO LEFT EAR  . DVT (deep venous thrombosis) ([place])   . Frequent falls   . History of DVT (deep vein thrombosis) 06/10/2013   Provoked s/p TURP. Dx per doppler 06/10/13. Complicated by hematuria while on anticoagulation s/p TURP. Initially on lovenox and coumadin, then stopped, then IVC filter placed 06/16/13. Anticoagulation restarted after hematuria stopped. Lovenox was discontinued apparently on 07/29/13 with comment on high risk for falls.   Total ~1.5 months of anticoagulation but interrupted with bleeding complication.  . Hyperlipidemia   . Hypertension   . Incontinence of urine    SOME INCONTINENCE  . Left adrenal mass ([place]) 09/11/2013  . Meningioma ([place])   . Stroke ([place])    Cerebellar, 2013; WALKS WITH WALKER, ABLE TO DRESS AND BATHE HIMSELF BUT FAMILY TRIES TO PROVIDE SUPERVISION BECAUSE OF HIS HX OF FALL AND WEAKNESS LEGS, ARMS   . Thrombocytopenia ([place])   .  Vitreous hemorrhage ([place]) 12/21/2013   OS 12/18/13 CT /MRI brain done for ataxia    Past Surgical History:  Procedure Laterality Date  . CYSTOSCOPY N/A 06/13/2013   Procedure: CYSTOSCOPY FLEXIBLE BEDSIDE;  Surgeon: Ardis Hughs, MD;  Location: [facility];  Service: Urology;  Laterality: N/A;  . MYRINGOTOMY WITH TUBE PLACEMENT Bilateral   . TRANSURETHRAL RESECTION OF PROSTATE N/A 05/19/2013    Procedure: TRANSURETHRAL RESECTION OF THE PROSTATE WITH GYRUS INSTRUMENTS;  Surgeon: Ailene Rud, MD;  Location: [facility];  Service: Urology;  Laterality: N/A;    No Known Allergies  Outpatient Encounter Medications as of 04/06/2020  Medication Sig  . acetaminophen (TYLENOL) 500 MG tablet Take 500 mg by mouth every 6 (six) hours as needed for fever.  Marland Kitchen amLODipine (NORVASC) 5 MG tablet Take 1 tablet (5 mg total) by mouth daily.  Marland Kitchen aspirin EC 81 MG EC tablet Take 1 tablet (81 mg total) by mouth daily.  Marland Kitchen atorvastatin (LIPITOR) 20 MG tablet Take 1 tablet (20 mg total) by mouth daily.  . furosemide (LASIX) 40 MG tablet Take 2 tablets (80 mg total) by mouth daily.  Marland Kitchen levETIRAcetam (KEPPRA XR) 500 MG 24 hr tablet Take 2 tablets (1,000 mg total) by mouth at bedtime.  . triamcinolone ointment (KENALOG) 0.1 % Apply 1 application topically 2 (two) times daily as needed (For leg rash).   No facility-administered encounter medications on file as of 04/06/2020.    Review of Systems  Unable to obtain due to dementia    Immunization History  Administered Date(s) Administered  . Influenza, High Dose Seasonal PF 02/20/2017  . Influenza,inj,Quad PF,6+ Mos 02/03/2013, 01/15/2014, 04/27/2016, 11/08/2017, 11/25/2018  . PFIZER(Purple Top)SARS-COV-2 Vaccination 05/11/2019, 06/10/2019  . PPD Test 01/28/2016  . Pneumococcal Conjugate-13 04/27/2016  . Pneumococcal Polysaccharide-23 02/03/2013   Pertinent  Health Maintenance Due  Topic Date Due  . INFLUENZA VACCINE  10/12/2019  . PNA vac Low Risk Adult  Completed   Fall Risk  09/16/2019 08/04/2019 07/10/2019 07/07/2019 11/25/2018  Falls in the past year? 0 0 0 0 0  Number falls in past yr: - - - - -  Comment - - - - -  Injury with Fall? - - - - -  Risk Factor Category  - - - - -  Risk for fall due to : - History of fall(s) Impaired balance/gait;Impaired mobility;Impaired vision Impaired balance/gait;Impaired mobility -  Risk for fall due to: Comment -  - - - -  Follow up - Falls evaluation completed Falls prevention discussed Falls prevention discussed -     Vitals:   04/06/20 1531  BP: 128/86  Pulse: 86  Resp: 17  Temp: (!) 97.1 F (36.2 C)  TempSrc: Oral  Weight: 193 lb (87.5 kg)  Height: 5\' 10"  (1.778 m)   Body mass index is 27.69 kg/m.  Physical Exam  GENERAL APPEARANCE: Well nourished. In no acute distress. Normal body habitus SKIN:  Skin is warm and dry.  MOUTH and THROAT: Lips are without lesions. Oral mucosa is moist and without lesions.  RESPIRATORY: Breathing is even & unlabored, BS CTAB CARDIAC: RRR, no murmur,no extra heart sounds, no edema GI: Abdomen soft, normal BS, no masses, no tenderness NEUROLOGICAL: There is no tremor. Speech is clear. Alert to self, disoriented to time and place. PSYCHIATRIC:  Affect and behavior are appropriate  Labs reviewed: Recent Labs    03/23/20 0828 03/24/20 0159 03/25/20 0254  NA 139 139 139  K 4.2 4.4 4.9  CL  101 101 101  CO2 30 29 29   GLUCOSE 118* 120* 167*  BUN 29* 33* 31*  CREATININE 1.13 1.10 1.17  CALCIUM 10.4* 10.3 10.3   Recent Labs    08/11/19 1300 03/11/20 1416 03/20/20 0316  AST 18 18 31   ALT 11 12 22   ALKPHOS 52 62 55  BILITOT 0.6 0.6 0.5  PROT 7.4 7.1 6.8  ALBUMIN 3.3* 3.5 3.2*   Recent Labs    03/22/20 0250 03/23/20 0828 03/24/20 0159 03/25/20 0254  WBC 6.3 6.7 7.4 8.2  NEUTROABS 5.1 4.8 5.5  --   HGB 12.2* 12.9* 13.2 13.1  HCT 38.6* 40.7 39.1 41.7  MCV 93.5 93.1 91.8 93.1  PLT 127* 131* 141* 137*   Lab Results  Component Value Date   TSH 1.507 02/26/2015   Lab Results  Component Value Date   HGBA1C 5.5 11/03/2018   Lab Results  Component Value Date   CHOL 109 11/03/2018   HDL 46 11/03/2018   LDLCALC 54 11/03/2018   TRIG 44 11/03/2018   CHOLHDL 2.4 11/03/2018    Significant Diagnostic Results in last 30 days:  CT HEAD WO CONTRAST  Result Date: 03/11/2020 CLINICAL DATA:  Difficulty standing and walking. Leans to the  right. Symptoms began on Tuesday. EXAM: CT HEAD WITHOUT CONTRAST TECHNIQUE: Contiguous axial images were obtained from the base of the skull through the vertex without intravenous contrast. COMPARISON:  CT head 08/11/2019.  MRI brain 08/10/2019 FINDINGS: Brain: Diffuse cerebral atrophy. Ventricular dilatation consistent with central atrophy. Low-attenuation changes throughout the deep white matter consistent with small vessel ischemia. Focal areas of encephalomalacia in the right frontal and occipital lobes as well as in the right cerebellum. No change in distribution since the previous study consistent with old infarcts. Calcified meningioma along the left sphenoid wing is unchanged. No mass-effect or midline shift. No abnormal extra-axial fluid collections. Basal cisterns are not effaced. No acute intracranial hemorrhage. Vascular: Mild intracranial arterial vascular calcifications. Skull: The calvarium appears intact. Sinuses/Orbits: Paranasal sinuses and mastoid air cells are clear. Other: None. IMPRESSION: 1. No acute intracranial abnormalities. 2. Chronic atrophy and small vessel ischemia. 3. Focal areas of encephalomalacia in the right frontal and occipital lobes as well as in the right cerebellum consistent with old infarcts. 4. Calcified meningioma along the left sphenoid wing is unchanged. Electronically Signed   By: Lucienne Capers M.D.   On: 03/11/2020 15:29   MR Brain W and Wo Contrast  Result Date: 03/11/2020 CLINICAL DATA:  Headache.  Intracranial hemorrhage suspected. EXAM: MRI HEAD WITHOUT AND WITH CONTRAST TECHNIQUE: Multiplanar, multiecho pulse sequences of the brain and surrounding structures were obtained without and with intravenous contrast. CONTRAST:  66mL GADAVIST GADOBUTROL 1 MMOL/ML IV SOLN COMPARISON:  Head CT March 11, 2020. FINDINGS: Brain: No acute infarction, hemorrhage, hydrocephalus or extra-axial collection. Large area of encephalomalacia and gliosis involving most of the  right cerebellar hemisphere and right middle and superior cerebellar peduncles with volume loss of the pons and medulla. Area of encephalomalacia and gliosis also seen in the bilateral occipital lobes, right greater than left and right frontal lobe. Remote lacunar infarcts in the bilateral cerebellar hemispheres. Scattered hemosiderin deposits are seen within the left cerebellar hemisphere, pons and bilateral occipital lobes as well as right precentral sulcus. Extensive confluent T2 hyperintensity within the white matter of the cerebral hemispheres, nonspecific, most likely related to chronic microangiopathic changes. Left frontal, partially calcified measuring approximately 2.4 by 1.8 by 2.5 cm with mass effect on the inferior  left frontal lobe. No abnormal parenchymal contrast enhancement. Vascular: Normal flow voids. Skull and upper cervical spine: Normal marrow signal. Sinuses/Orbits: Right lens surgery. Trace mucosal thickening of the ethmoid cells. IMPRESSION: 1. No acute intracranial abnormality. 2. Large area of encephalomalacia and gliosis involving most of the right cerebellar hemisphere and right middle and superior cerebellar peduncles with volume loss of the pons and medulla. 3. Areas of encephalomalacia and gliosis in the bilateral occipital lobes, right greater than left and right frontal lobe. Remote lacunar infarcts in the bilateral cerebellar hemispheres. 4. Scattered hemosiderin deposits within the left cerebellar hemisphere, pons and bilateral occipital lobes as well as right precentral sulcus. 5. Advanced chronic white matter disease. Electronically Signed   By: Pedro Earls M.D.   On: 03/11/2020 19:16   DG Chest Portable 1 View  Result Date: 03/19/2020 CLINICAL DATA:  Per ED Triage Note: "Pt brought to ED by [facility] from home for fever, increase cough and fatigue for the past 2 days, recently exposed by family to covid. Pt is vaccinated."Hx of HTN EXAM: PORTABLE CHEST 1 VIEW  COMPARISON:  03/11/2020. FINDINGS: Cardiac silhouette is mildly enlarged. No mediastinal or hilar masses. Lungs are clear.  No pleural effusion or pneumothorax. Skeletal structures are grossly intact. IMPRESSION: No active disease. Electronically Signed   By: Lajean Manes M.D.   On: 03/19/2020 14:38   DG Chest Portable 1 View  Result Date: 03/11/2020 CLINICAL DATA:  Weakness. EXAM: PORTABLE CHEST 1 VIEW COMPARISON:  Chest x-ray dated April 13, 2019. FINDINGS: The heart size and mediastinal contours are within normal limits. Both lungs are clear. The visualized skeletal structures are unremarkable. IMPRESSION: No active disease. Electronically Signed   By: Titus Dubin M.D.   On: 03/11/2020 16:19    Assessment/Plan  1. Chronic diastolic (congestive) heart failure (HCC) -  No SOB, continue Furosemide  2. Focal seizure ([place]) -  No seizure episode, continue Levetiracetam  3. Primary hypertension -  BPs stable, continue Amlodipine  4. COVID-19 -  No SOB, completed Decadron X 5 days in the hospital  5. Vascular dementia without behavioral disturbance ([place]) -  BIMS 0/15, continue supportive care     Family/ staff Communication:  Discussed plan of care with charge nurse.  Labs/tests ordered:  None  Goals of care:   Short-term care    Durenda Age, DNP, MSN, FNP-BC [facility] [phone] (Monday-Friday 8:00 a.m. - 5:00 p.m.) [phone] (after hours)

## 2020-04-12 ENCOUNTER — Encounter: Payer: Self-pay | Admitting: Adult Health

## 2020-04-12 ENCOUNTER — Non-Acute Institutional Stay (SKILLED_NURSING_FACILITY): Payer: Medicare HMO | Admitting: Adult Health

## 2020-04-12 DIAGNOSIS — R531 Weakness: Secondary | ICD-10-CM

## 2020-04-12 DIAGNOSIS — I10 Essential (primary) hypertension: Secondary | ICD-10-CM | POA: Diagnosis not present

## 2020-04-12 DIAGNOSIS — U071 COVID-19: Secondary | ICD-10-CM

## 2020-04-12 DIAGNOSIS — R569 Unspecified convulsions: Secondary | ICD-10-CM

## 2020-04-12 DIAGNOSIS — F0150 Vascular dementia without behavioral disturbance: Secondary | ICD-10-CM

## 2020-04-12 DIAGNOSIS — I5032 Chronic diastolic (congestive) heart failure: Secondary | ICD-10-CM | POA: Diagnosis not present

## 2020-04-12 MED ORDER — FUROSEMIDE 40 MG PO TABS: 80.0000 mg | ORAL_TABLET | Freq: Every day | ORAL | 0 refills | Status: DC

## 2020-04-12 MED ORDER — ATORVASTATIN CALCIUM 20 MG PO TABS: 20.0000 mg | ORAL_TABLET | Freq: Every day | ORAL | 0 refills | Status: DC

## 2020-04-12 MED ORDER — AMLODIPINE BESYLATE 5 MG PO TABS: 5.0000 mg | ORAL_TABLET | Freq: Every day | ORAL | 0 refills | Status: DC

## 2020-04-12 MED ORDER — TRIAMCINOLONE ACETONIDE 0.1 % EX OINT: 1.0000 "application " | TOPICAL_OINTMENT | Freq: Two times a day (BID) | CUTANEOUS | 0 refills | Status: DC | PRN

## 2020-04-12 MED ORDER — LEVETIRACETAM ER 500 MG PO TB24
1000.0000 mg | ORAL_TABLET | Freq: Every day | ORAL | 0 refills | Status: DC
Start: 2020-04-12 — End: 2020-04-15

## 2020-04-12 NOTE — Progress Notes (Addendum)
Location:  [facility] Room Number: 227-A Place of Service:  SNF (31) Provider:  Kenard Gower, DNP, FNP-BC  Patient Care Team: Gust Rung, DO as PCP - General (Internal Medicine)  Extended Emergency Contact Information Primary Emergency Contact: Rembert,[name] Address: [address]          [place], [place] [address] Darden Amber of [place] Home Phone: [phone] Mobile Phone: [phone] Relation: Daughter Secondary Emergency Contact: Rembert,Raestarsha Address: [address]          [place], [place] [address] Darden Amber of [place] Home Phone: [phone] Mobile Phone: [phone] Relation: Granddaughter  Code Status:  DNR  Goals of care: Advanced Directive information Advanced Directives 04/12/2020  Does Patient Have a Medical Advance Directive? Yes  Type of Advance Directive Out of facility DNR (pink MOST or yellow form)  Does patient want to make changes to medical advance directive? No - Patient declined  Copy of Healthcare Power of Attorney in Chart? -  Would patient like information on creating a medical advance directive? -  Pre-existing out of facility DNR order (yellow form or pink MOST form) -     Chief Complaint  Patient presents with  . Discharge Note    Patient seen for discharge from SNF    HPI:  Pt is a 87 y.o. male seen today for discharge from [facility] with Home Health PT and OT.  He was admitted to [facility] on 03/26/20 post [facility] hospitalization 03/19/20 to 1/14//22 for generalized weakness due to COVID-19. He completed 5 day course of Decadron. He has a PMH of essential hypertension, dyslipidemia, seizure disorder, history of cerebellar stroke, history of left adrenal mass, history of DVT, CKD and BPH.  Patient was admitted to this facility for short-term rehabilitation after the patient's recent hospitalization.  Patient has completed SNF rehabilitation and  therapy has cleared the patient for discharge.   Past Medical History:  Diagnosis Date  . Anemia   . BPH (benign prostatic hypertrophy)    TURP 05/19/13  . Cerebral embolism with cerebral infarction (HCC) 12/19/2013  . Chronic kidney disease    CHRONIC KIDNEY DISEASE, 2  . Difficulty hearing, right    BILATERAL HEARING LOSS - BEST TO TRY TO SPEAK INTO LEFT EAR  . DVT (deep venous thrombosis) (HCC)   . Frequent falls   . History of DVT (deep vein thrombosis) 06/10/2013   Provoked s/p TURP. Dx per doppler 06/10/13. Complicated by hematuria while on anticoagulation s/p TURP. Initially on lovenox and coumadin, then stopped, then IVC filter placed 06/16/13. Anticoagulation restarted after hematuria stopped. Lovenox was discontinued apparently on 07/29/13 with comment on high risk for falls.   Total ~1.5 months of anticoagulation but interrupted with bleeding complication.  . Hyperlipidemia   . Hypertension   . Incontinence of urine    SOME INCONTINENCE  . Left adrenal mass (HCC) 09/11/2013  . Meningioma (HCC)   . Stroke (HCC)    Cerebellar, 2013; WALKS WITH WALKER, ABLE TO DRESS AND BATHE HIMSELF BUT FAMILY TRIES TO PROVIDE SUPERVISION BECAUSE OF HIS HX OF FALL AND WEAKNESS LEGS, ARMS   . Thrombocytopenia (HCC)   . Vitreous hemorrhage (HCC) 12/21/2013   OS 12/18/13 CT /MRI brain done for ataxia    Past Surgical History:  Procedure Laterality Date  . CYSTOSCOPY N/A 06/13/2013   Procedure: CYSTOSCOPY FLEXIBLE BEDSIDE;  Surgeon: Crist Fat, MD;  Location: [facility];  Service: Urology;  Laterality: N/A;  . MYRINGOTOMY WITH  TUBE PLACEMENT Bilateral   . TRANSURETHRAL RESECTION OF PROSTATE N/A 05/19/2013   Procedure: TRANSURETHRAL RESECTION OF THE PROSTATE WITH GYRUS INSTRUMENTS;  Surgeon: Ailene Rud, MD;  [facility];  Service: Urology;  Laterality: N/A;    No Known Allergies  Outpatient Encounter Medications as of 04/12/2020  Medication Sig  . acetaminophen (TYLENOL) 500 MG tablet  Take 500 mg by mouth every 6 (six) hours as needed for fever.  Marland Kitchen amLODipine (NORVASC) 5 MG tablet Take 1 tablet (5 mg total) by mouth daily.  Marland Kitchen aspirin EC 81 MG EC tablet Take 1 tablet (81 mg total) by mouth daily.  Marland Kitchen atorvastatin (LIPITOR) 20 MG tablet Take 1 tablet (20 mg total) by mouth daily.  . furosemide (LASIX) 40 MG tablet Take 2 tablets (80 mg total) by mouth daily.  Marland Kitchen levETIRAcetam (KEPPRA XR) 500 MG 24 hr tablet Take 2 tablets (1,000 mg total) by mouth at bedtime.  . triamcinolone ointment (KENALOG) 0.1 % Apply 1 application topically 2 (two) times daily as needed (For leg rash).   No facility-administered encounter medications on file as of 04/12/2020.    Review of Systems  Unable to obtain due to dementia    Immunization History  Administered Date(s) Administered  . Influenza, High Dose Seasonal PF 02/20/2017  . Influenza,inj,Quad PF,6+ Mos 02/03/2013, 01/15/2014, 04/27/2016, 11/08/2017, 11/25/2018  . PFIZER(Purple Top)SARS-COV-2 Vaccination 05/11/2019, 06/10/2019  . PPD Test 01/28/2016  . Pneumococcal Conjugate-13 04/27/2016  . Pneumococcal Polysaccharide-23 02/03/2013   Pertinent  Health Maintenance Due  Topic Date Due  . INFLUENZA VACCINE  10/12/2019  . PNA vac Low Risk Adult  Completed   Fall Risk  09/16/2019 08/04/2019 07/10/2019 07/07/2019 11/25/2018  Falls in the past year? 0 0 0 0 0  Number falls in past yr: - - - - -  Comment - - - - -  Injury with Fall? - - - - -  Risk Factor Category  - - - - -  Risk for fall due to : - History of fall(s) Impaired balance/gait;Impaired mobility;Impaired vision Impaired balance/gait;Impaired mobility -  Risk for fall due to: Comment - - - - -  Follow up - Falls evaluation completed Falls prevention discussed Falls prevention discussed -     Vitals:   04/12/20 1228  BP: 120/85  Pulse: (!) 51  Resp: 20  Temp: 98.3 F (36.8 C)  TempSrc: Oral  Weight: 193 lb (87.5 kg)  Height: 5\' 10"  (1.778 m)   Body mass index is 27.69  kg/m.  Physical Exam  GENERAL APPEARANCE: Well nourished. In no acute distress. Normal body habitus SKIN:  Skin is warm and dry. EARS:  Has hearing aid on left ear  MOUTH and THROAT: Lips are without lesions. Oral mucosa is moist and without lesions.  RESPIRATORY: Breathing is even & unlabored, BS CTAB CARDIAC: RRR, no murmur,no extra heart sounds, no edema GI: Abdomen soft, normal BS, no masses, no tenderness EXTREMITIES:  Able to move X 4 exremities NEUROLOGICAL: There is no tremor. Speech is clear. Alert to self, disoriented to time and place. PSYCHIATRIC:  Affect and behavior are appropriate  Labs reviewed: Recent Labs    03/23/20 0828 03/24/20 0159 03/25/20 0254  NA 139 139 139  K 4.2 4.4 4.9  CL 101 101 101  CO2 30 29 29   GLUCOSE 118* 120* 167*  BUN 29* 33* 31*  CREATININE 1.13 1.10 1.17  CALCIUM 10.4* 10.3 10.3   Recent Labs    08/11/19 1300 03/11/20 1416  03/20/20 0316  AST 18 18 31   ALT 11 12 22   ALKPHOS 52 62 55  BILITOT 0.6 0.6 0.5  PROT 7.4 7.1 6.8  ALBUMIN 3.3* 3.5 3.2*   Recent Labs    03/22/20 0250 03/23/20 0828 03/24/20 0159 03/25/20 0254  WBC 6.3 6.7 7.4 8.2  NEUTROABS 5.1 4.8 5.5  --   HGB 12.2* 12.9* 13.2 13.1  HCT 38.6* 40.7 39.1 41.7  MCV 93.5 93.1 91.8 93.1  PLT 127* 131* 141* 137*   Lab Results  Component Value Date   TSH 1.507 02/26/2015   Lab Results  Component Value Date   HGBA1C 5.5 11/03/2018   Lab Results  Component Value Date   CHOL 109 11/03/2018   HDL 46 11/03/2018   LDLCALC 54 11/03/2018   TRIG 44 11/03/2018   CHOLHDL 2.4 11/03/2018    Significant Diagnostic Results in last 30 days:  DG Chest Portable 1 View  Result Date: 03/19/2020 CLINICAL DATA:  Per ED Triage Note: "Pt brought to ED by [facility] from home for fever, increase cough and fatigue for the past 2 days, recently exposed by family to covid. Pt is vaccinated."Hx of HTN EXAM: PORTABLE CHEST 1 VIEW COMPARISON:  03/11/2020. FINDINGS: Cardiac silhouette is  mildly enlarged. No mediastinal or hilar masses. Lungs are clear.  No pleural effusion or pneumothorax. Skeletal structures are grossly intact. IMPRESSION: No active disease. Electronically Signed   By: Lajean Manes M.D.   On: 03/19/2020 14:38    Assessment/Plan  1. COVID-19 -  Completed Decadron X 5 days in the hospital  2. Generalized weakness -  For Home health PT and OT, for therapeutic strengthening exercises  3. Chronic diastolic (congestive) heart failure (HCC) - furosemide (LASIX) 40 MG tablet; Take 2 tablets (80 mg total) by mouth daily.  Dispense: 60 tablet; Refill: 0  4. Primary hypertension - amLODipine (NORVASC) 5 MG tablet; Take 1 tablet (5 mg total) by mouth daily.  Dispense: 30 tablet; Refill: 0  5. Focal seizure (HCC) - levETIRAcetam (KEPPRA XR) 500 MG 24 hr tablet; Take 2 tablets (1,000 mg total) by mouth at bedtime.  Dispense: 60 tablet; Refill: 0  6. Vascular dementia without behavioral disturbance ([place]) -  Continue supportive care and fall precautions      I have filled out patient's discharge paperwork and e-prescribed medications. Patient will have home health PT and OT.  DME provided:   Bedside commode, wheelchair, hoyer lift and walker  Wheelchair  - Patient suffers from generalized weakness due to COVID-19 and has focal seizure which impairs his ability to perform daily activities like toileting, feeding, dressing, grooming and bathing in the home. A cane or walker will not resolve issue with performing activities of daily living. A wheelchair will allow patient to safely perform daily activities. Patient has a caregiver who can provide assistance with wheelchair.  Total discharge time: Greater/less than 30 minutes Greater that 50% was spent in counseling and coordination of care.   Discharge time involved coordination of the discharge process with social worker, nursing staff and therapy department. Medical justification for home health services/DME  verified.    Durenda Age, DNP, MSN, FNP-BC [facility] [phone] (Monday-Friday 8:00 a.m. - 5:00 p.m.) [phone] (after hours)

## 2020-04-15 ENCOUNTER — Other Ambulatory Visit: Payer: Self-pay

## 2020-04-15 ENCOUNTER — Ambulatory Visit (INDEPENDENT_AMBULATORY_CARE_PROVIDER_SITE_OTHER): Payer: Medicare HMO | Admitting: Internal Medicine

## 2020-04-15 VITALS — BP 135/84 | HR 85 | Temp 98.2°F

## 2020-04-15 DIAGNOSIS — R569 Unspecified convulsions: Secondary | ICD-10-CM | POA: Diagnosis not present

## 2020-04-15 DIAGNOSIS — F0150 Vascular dementia without behavioral disturbance: Secondary | ICD-10-CM | POA: Diagnosis not present

## 2020-04-15 DIAGNOSIS — D329 Benign neoplasm of meninges, unspecified: Secondary | ICD-10-CM | POA: Diagnosis not present

## 2020-04-15 DIAGNOSIS — G40209 Localization-related (focal) (partial) symptomatic epilepsy and epileptic syndromes with complex partial seizures, not intractable, without status epilepticus: Secondary | ICD-10-CM | POA: Diagnosis not present

## 2020-04-15 DIAGNOSIS — G3281 Cerebellar ataxia in diseases classified elsewhere: Secondary | ICD-10-CM | POA: Diagnosis not present

## 2020-04-15 DIAGNOSIS — I5032 Chronic diastolic (congestive) heart failure: Secondary | ICD-10-CM | POA: Diagnosis not present

## 2020-04-15 DIAGNOSIS — I10 Essential (primary) hypertension: Secondary | ICD-10-CM

## 2020-04-15 DIAGNOSIS — I110 Hypertensive heart disease with heart failure: Secondary | ICD-10-CM

## 2020-04-15 MED ORDER — LEVETIRACETAM ER 500 MG PO TB24: 1000.0000 mg | ORAL_TABLET | Freq: Every day | ORAL | 1 refills | Status: DC

## 2020-04-15 MED ORDER — FUROSEMIDE 40 MG PO TABS
80.0000 mg | ORAL_TABLET | Freq: Every day | ORAL | 1 refills | Status: DC | PRN
Start: 2020-04-15 — End: 2020-05-25

## 2020-04-15 MED ORDER — ATORVASTATIN CALCIUM 20 MG PO TABS: 20.0000 mg | ORAL_TABLET | Freq: Every day | ORAL | 3 refills | Status: DC

## 2020-04-15 MED ORDER — AMLODIPINE BESYLATE 5 MG PO TABS: 5.0000 mg | ORAL_TABLET | Freq: Every day | ORAL | 3 refills | Status: DC

## 2020-04-20 ENCOUNTER — Encounter: Payer: Self-pay | Admitting: Internal Medicine

## 2020-04-20 DIAGNOSIS — U071 COVID-19: Secondary | ICD-10-CM | POA: Diagnosis not present

## 2020-04-20 NOTE — Assessment & Plan Note (Signed)
No new seizure activity remains on Keppra XR 1000 mg daily.

## 2020-04-20 NOTE — Assessment & Plan Note (Signed)
HPI: He is accompanied again by his daughter [name].  She notes he continues to have great difficulty with transfers and mobility.  She reports he is further deconditioned after his recent hospitalization for COVID-19.  She notes that he was discharge initially to [facility] but feels he did not get much therapy partially because of lack of effort when she could not visit.  She then change nursing homes but still he appeared depressed and did not participate much with therapy.  Assessment ataxia as late effect of CVA, deconditioning secondary to [place] notes that PT and OT were supposed to be ordered on discharge from the nursing home I do think he would benefit from home health PT she has not yet been contacted and so I will reorder home health PT and OT.

## 2020-04-20 NOTE — Assessment & Plan Note (Signed)
Chronic and stable.   

## 2020-04-20 NOTE — Assessment & Plan Note (Signed)
HPI: No shortness of breath.  [name] has been holding his Lasix for the last few days due to him not drinking much water.  Assessment chronic diastolic congestive heart failure  Plan Discussed with [name] that she is doing well and taking care of her father if he is not taking much fluid intake she rightfully should hold Lasix.

## 2020-04-20 NOTE — Assessment & Plan Note (Signed)
HPI: No issues  Assessment hypertension controlled  Plan Continue 5 mg amlodipine.

## 2020-04-20 NOTE — Progress Notes (Signed)
Subjective:  HPI: [name] is a 87 y.o. male who presents for f/u ataxia following CVA, recent COVID-19 infection  Please see Assessment and Plan below for the status of his chronic medical problems.  Objective:  Physical Exam: Vitals:   04/15/20 1044  BP: 135/84  Pulse: 85  Temp: 98.2 F (36.8 C)  TempSrc: Oral  SpO2: 97%   There is no height or weight on file to calculate BMI. Physical Exam Constitutional:      Appearance: Normal appearance. He is not ill-appearing.     Comments: In wheelchair  HENT:     Head:     Comments: HOH bilateral Cardiovascular:     Rate and Rhythm: Normal rate and regular rhythm.  Pulmonary:     Effort: Pulmonary effort is normal.  Abdominal:     General: Abdomen is flat.     Palpations: Abdomen is soft.  Musculoskeletal:     Right lower leg: No edema.     Left lower leg: No edema.  Neurological:     General: No focal deficit present.     Mental Status: He is alert. Mental status is at baseline.     Comments: Strength 5/5 in bilateral upper extremities, strength 5/5 in hip flexion and extension bialterally.  Gait not assessed  Psychiatric:        Mood and Affect: Mood normal.    Assessment & Plan:  See Encounters Tab for problem based charting.  Medications Ordered Meds ordered this encounter  Medications  . furosemide (LASIX) 40 MG tablet    Sig: Take 2 tablets (80 mg total) by mouth daily as needed for fluid.    Dispense:  180 tablet    Refill:  1  . atorvastatin (LIPITOR) 20 MG tablet    Sig: Take 1 tablet (20 mg total) by mouth daily.    Dispense:  90 tablet    Refill:  3  . amLODipine (NORVASC) 5 MG tablet    Sig: Take 1 tablet (5 mg total) by mouth daily.    Dispense:  90 tablet    Refill:  3  . levETIRAcetam (KEPPRA XR) 500 MG 24 hr tablet    Sig: Take 2 tablets (1,000 mg total) by mouth at bedtime.    Dispense:  180 tablet    Refill:  1   Other Orders Orders Placed This Encounter  Procedures  . Home  Health    Order Specific Question:   To provide the following care/treatments    Answer:   PT    Order Specific Question:   To provide the following care/treatments    Answer:   OT    Order Specific Question:   To provide the following care/treatments    Answer:   RN  . Face-to-face encounter (required for Medicare/Medicaid patients)    I [name] certify that this patient is under my care and that I, or a nurse practitioner or physician's assistant working with me, had a face-to-face encounter that meets the physician face-to-face encounter requirements with this patient on 04/15/2020. The encounter with the patient was in whole, or in part for the following medical condition(s) which is the primary reason for home health care (List medical condition): Weakness, Decondioning, Recent Covid-19 infection. Dementia, Ataxia as late affect of CVA    Order Specific Question:   The encounter with the patient was in whole, or in part, for the following medical condition, which is the primary reason for home health care  Answer:   Weakness/Deconditing secondary to Covid-19    Order Specific Question:   I certify that, based on my findings, the following services are medically necessary home health services    Answer:   Physical therapy    Order Specific Question:   I certify that, based on my findings, the following services are medically necessary home health services    Answer:   Nursing    Order Specific Question:   Reason for Medically [place]    Answer:   Therapy- Personnel officer, Adult nurse Specific Question:   Reason for Medically Necessary Home Health Services    Answer:   Therapy- Therapeutic Exercises to Increase Strength and Endurance    Order Specific Question:   Reason for Medically Necessary Home Health Services    Answer:   Skilled Nursing- Skilled Assessment/Observation    Order Specific Question:   My clinical findings support  the need for the above services    Answer:   Cognitive impairments, dementia, or mental confusion  that make it unsafe to leave home    Order Specific Question:   My clinical findings support the need for the above services    Answer:   Unsafe ambulation due to balance issues    Order Specific Question:   Further, I certify that my clinical findings support that this patient is homebound due to:    Answer:   Unsafe ambulation due to balance issues   Follow Up: Return in about 3 months (around 07/13/2020).

## 2020-04-26 ENCOUNTER — Telehealth: Payer: Self-pay | Admitting: *Deleted

## 2020-04-26 NOTE — Telephone Encounter (Signed)
Did I not place a referral during the office visit?

## 2020-04-26 NOTE — Addendum Note (Signed)
Addended by: [name] on: 04/26/2020 04:12 PM   Modules accepted: Orders

## 2020-04-26 NOTE — Telephone Encounter (Signed)
Jinny Blossom, RN Case Manager with [employer] called in with patient's daughter. States they haven't heard anything about [facility] PT/OT that was discussed at [facility] on 04/15/2020. [name] would like to use [facility] if possible. Will route to PCP for referral L. Jurnee Nakayama, BSN, RN-BC

## 2020-04-26 NOTE — Telephone Encounter (Signed)
Sorry about that, order placed

## 2020-04-26 NOTE — Telephone Encounter (Signed)
Yes, but it needs to be an Ambulatory Referral to [facility] so that it goes into a work queue. Please order as Ambulatory Referral. Thank you.

## 2020-04-27 ENCOUNTER — Telehealth: Payer: Self-pay

## 2020-04-27 NOTE — Telephone Encounter (Signed)
[name] notified that [facility] has accepted referral for [facility] needs. She is very Patent attorney. [name], BSN, RN-BC

## 2020-04-27 NOTE — Telephone Encounter (Signed)
Pt's daughter requesting to speak with Lauren regarding home health. Please call back.

## 2020-04-27 NOTE — Telephone Encounter (Signed)
CM sent to Adela Lank with [facility] to see if he can accept patient for [facility] RN/PT/Aide. [name], BSN, RN-BC

## 2020-04-27 NOTE — Telephone Encounter (Signed)
Returned call to [place]. Explained message has been sent to Rep at [facility] for [facility]. States she received a call from [facility] who wants to come out today but she did not like their reviews. [facility] has not contacted [facility] for [facility]. Will call [name] back once response is received from [place]. [name], BSN, RN-BC

## 2020-04-27 NOTE — Telephone Encounter (Signed)
Adela Lank  Kenadi Miltner, Orvis Brill, RN  Yes we can. Thank you

## 2020-04-29 DIAGNOSIS — L89622 Pressure ulcer of left heel, stage 2: Secondary | ICD-10-CM | POA: Diagnosis not present

## 2020-04-29 DIAGNOSIS — I130 Hypertensive heart and chronic kidney disease with heart failure and stage 1 through stage 4 chronic kidney disease, or unspecified chronic kidney disease: Secondary | ICD-10-CM | POA: Diagnosis not present

## 2020-04-29 DIAGNOSIS — I69393 Ataxia following cerebral infarction: Secondary | ICD-10-CM | POA: Diagnosis not present

## 2020-04-29 DIAGNOSIS — G40209 Localization-related (focal) (partial) symptomatic epilepsy and epileptic syndromes with complex partial seizures, not intractable, without status epilepticus: Secondary | ICD-10-CM | POA: Diagnosis not present

## 2020-04-29 DIAGNOSIS — F0390 Unspecified dementia without behavioral disturbance: Secondary | ICD-10-CM | POA: Diagnosis not present

## 2020-04-29 DIAGNOSIS — N400 Enlarged prostate without lower urinary tract symptoms: Secondary | ICD-10-CM | POA: Diagnosis not present

## 2020-04-29 DIAGNOSIS — N183 Chronic kidney disease, stage 3 unspecified: Secondary | ICD-10-CM | POA: Diagnosis not present

## 2020-04-29 DIAGNOSIS — I5032 Chronic diastolic (congestive) heart failure: Secondary | ICD-10-CM | POA: Diagnosis not present

## 2020-04-29 DIAGNOSIS — D329 Benign neoplasm of meninges, unspecified: Secondary | ICD-10-CM | POA: Diagnosis not present

## 2020-04-30 ENCOUNTER — Telehealth: Payer: Self-pay

## 2020-04-30 DIAGNOSIS — L89622 Pressure ulcer of left heel, stage 2: Secondary | ICD-10-CM | POA: Diagnosis not present

## 2020-04-30 DIAGNOSIS — G40209 Localization-related (focal) (partial) symptomatic epilepsy and epileptic syndromes with complex partial seizures, not intractable, without status epilepticus: Secondary | ICD-10-CM | POA: Diagnosis not present

## 2020-04-30 DIAGNOSIS — N400 Enlarged prostate without lower urinary tract symptoms: Secondary | ICD-10-CM | POA: Diagnosis not present

## 2020-04-30 DIAGNOSIS — D329 Benign neoplasm of meninges, unspecified: Secondary | ICD-10-CM | POA: Diagnosis not present

## 2020-04-30 DIAGNOSIS — N183 Chronic kidney disease, stage 3 unspecified: Secondary | ICD-10-CM | POA: Diagnosis not present

## 2020-04-30 DIAGNOSIS — I5032 Chronic diastolic (congestive) heart failure: Secondary | ICD-10-CM | POA: Diagnosis not present

## 2020-04-30 DIAGNOSIS — I130 Hypertensive heart and chronic kidney disease with heart failure and stage 1 through stage 4 chronic kidney disease, or unspecified chronic kidney disease: Secondary | ICD-10-CM | POA: Diagnosis not present

## 2020-04-30 DIAGNOSIS — I69393 Ataxia following cerebral infarction: Secondary | ICD-10-CM | POA: Diagnosis not present

## 2020-04-30 DIAGNOSIS — F0390 Unspecified dementia without behavioral disturbance: Secondary | ICD-10-CM | POA: Diagnosis not present

## 2020-04-30 NOTE — Telephone Encounter (Signed)
[name], RN with [name] called stating SOC was today. Requesting VO for left heel wound calcium alginate with dry dressing 2 week 2 and 1 week 5. Verbal auth given. Will route to PCP for agreement/denial. [name], BSN, RN-BC

## 2020-04-30 NOTE — Telephone Encounter (Signed)
Returned call to [facility] with [facility]. No answer and, "call cannot be completed at this time." L. Flecia Shutter, BSN, RN-BC

## 2020-04-30 NOTE — Telephone Encounter (Signed)
RTC, VM obtained "We are sorry but your call cannot be completed at this time, please hang up and try your call later". SChaplin, RN,BSN

## 2020-04-30 NOTE — Telephone Encounter (Signed)
Pls contact [facility] [phone] need VO

## 2020-05-04 DIAGNOSIS — G40209 Localization-related (focal) (partial) symptomatic epilepsy and epileptic syndromes with complex partial seizures, not intractable, without status epilepticus: Secondary | ICD-10-CM | POA: Diagnosis not present

## 2020-05-04 DIAGNOSIS — I5032 Chronic diastolic (congestive) heart failure: Secondary | ICD-10-CM | POA: Diagnosis not present

## 2020-05-04 DIAGNOSIS — I130 Hypertensive heart and chronic kidney disease with heart failure and stage 1 through stage 4 chronic kidney disease, or unspecified chronic kidney disease: Secondary | ICD-10-CM | POA: Diagnosis not present

## 2020-05-04 DIAGNOSIS — N183 Chronic kidney disease, stage 3 unspecified: Secondary | ICD-10-CM | POA: Diagnosis not present

## 2020-05-04 DIAGNOSIS — D329 Benign neoplasm of meninges, unspecified: Secondary | ICD-10-CM | POA: Diagnosis not present

## 2020-05-04 DIAGNOSIS — L89622 Pressure ulcer of left heel, stage 2: Secondary | ICD-10-CM | POA: Diagnosis not present

## 2020-05-04 DIAGNOSIS — F0390 Unspecified dementia without behavioral disturbance: Secondary | ICD-10-CM | POA: Diagnosis not present

## 2020-05-04 DIAGNOSIS — I69393 Ataxia following cerebral infarction: Secondary | ICD-10-CM | POA: Diagnosis not present

## 2020-05-04 DIAGNOSIS — N400 Enlarged prostate without lower urinary tract symptoms: Secondary | ICD-10-CM | POA: Diagnosis not present

## 2020-05-05 DIAGNOSIS — M6281 Muscle weakness (generalized): Secondary | ICD-10-CM | POA: Diagnosis not present

## 2020-05-05 DIAGNOSIS — N183 Chronic kidney disease, stage 3 unspecified: Secondary | ICD-10-CM | POA: Diagnosis not present

## 2020-05-05 DIAGNOSIS — F0390 Unspecified dementia without behavioral disturbance: Secondary | ICD-10-CM | POA: Diagnosis not present

## 2020-05-05 DIAGNOSIS — Z9181 History of falling: Secondary | ICD-10-CM | POA: Diagnosis not present

## 2020-05-05 DIAGNOSIS — D329 Benign neoplasm of meninges, unspecified: Secondary | ICD-10-CM | POA: Diagnosis not present

## 2020-05-05 DIAGNOSIS — L89622 Pressure ulcer of left heel, stage 2: Secondary | ICD-10-CM | POA: Diagnosis not present

## 2020-05-05 DIAGNOSIS — G4089 Other seizures: Secondary | ICD-10-CM | POA: Diagnosis not present

## 2020-05-05 DIAGNOSIS — G40209 Localization-related (focal) (partial) symptomatic epilepsy and epileptic syndromes with complex partial seizures, not intractable, without status epilepticus: Secondary | ICD-10-CM | POA: Diagnosis not present

## 2020-05-05 DIAGNOSIS — N400 Enlarged prostate without lower urinary tract symptoms: Secondary | ICD-10-CM | POA: Diagnosis not present

## 2020-05-05 DIAGNOSIS — I130 Hypertensive heart and chronic kidney disease with heart failure and stage 1 through stage 4 chronic kidney disease, or unspecified chronic kidney disease: Secondary | ICD-10-CM | POA: Diagnosis not present

## 2020-05-05 DIAGNOSIS — I69393 Ataxia following cerebral infarction: Secondary | ICD-10-CM | POA: Diagnosis not present

## 2020-05-05 DIAGNOSIS — U071 COVID-19: Secondary | ICD-10-CM | POA: Diagnosis not present

## 2020-05-05 DIAGNOSIS — F0150 Vascular dementia without behavioral disturbance: Secondary | ICD-10-CM | POA: Diagnosis not present

## 2020-05-05 DIAGNOSIS — I5032 Chronic diastolic (congestive) heart failure: Secondary | ICD-10-CM | POA: Diagnosis not present

## 2020-05-05 IMAGING — DX PORTABLE CHEST - 1 VIEW
1 series · 1 of 1 positions shown · non-contrast
Comparison: October 08, 2018

CLINICAL DATA: Syncopal episode.

EXAM:
PORTABLE CHEST 1 VIEW

[chest ap]
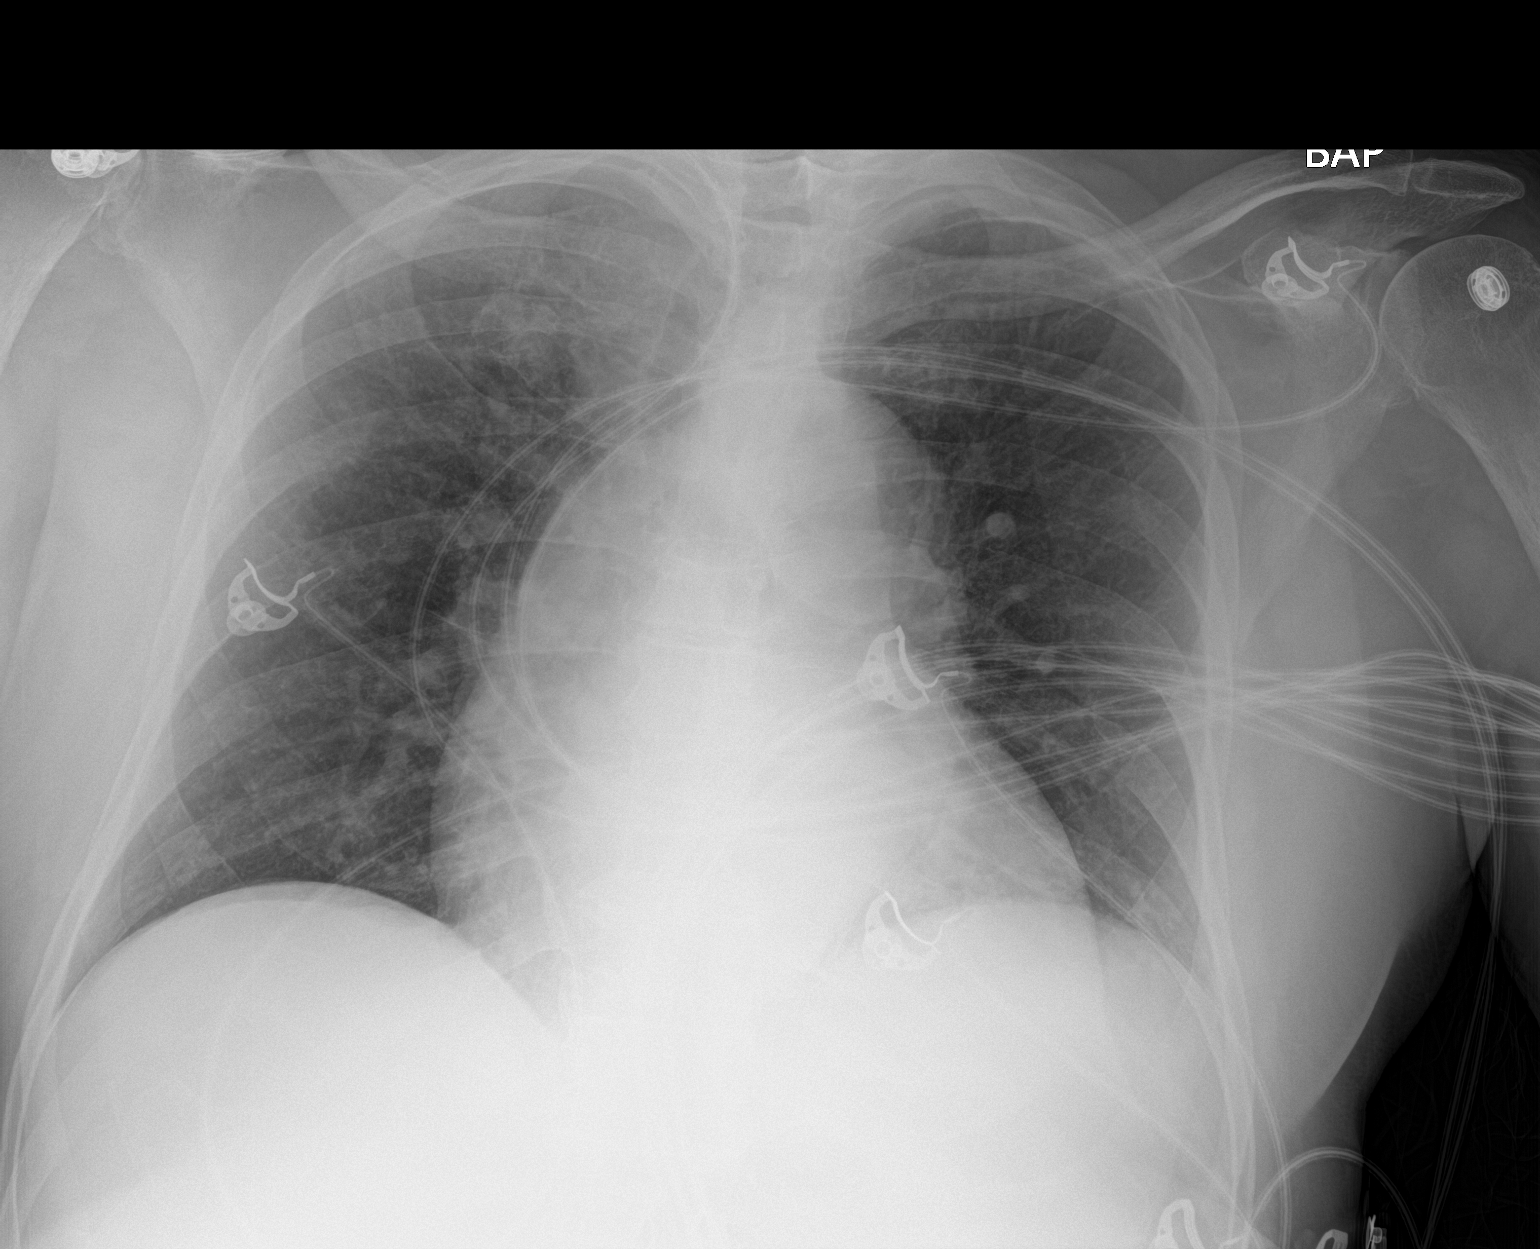

[1 of 1 positions shown; findings below may reference images not displayed]

FINDINGS: The cardiac silhouette is mildly enlarged. Prominence of the contour
of the ascending aorta, similar to prior radiographs in a patient
with known ascending aortic aneurysm.

There is no evidence of focal airspace consolidation, pleural
effusion or pneumothorax.

Osseous structures are without acute abnormality. Soft tissues are
grossly normal.
IMPRESSION: 1. Mildly enlarged cardiac silhouette.
2. Prominence of the contour of the ascending aorta, similar to
prior radiographs in a patient with known ascending aortic aneurysm.

## 2020-05-07 ENCOUNTER — Telehealth: Payer: Self-pay | Admitting: *Deleted

## 2020-05-07 DIAGNOSIS — I130 Hypertensive heart and chronic kidney disease with heart failure and stage 1 through stage 4 chronic kidney disease, or unspecified chronic kidney disease: Secondary | ICD-10-CM | POA: Diagnosis not present

## 2020-05-07 DIAGNOSIS — D329 Benign neoplasm of meninges, unspecified: Secondary | ICD-10-CM | POA: Diagnosis not present

## 2020-05-07 DIAGNOSIS — F0390 Unspecified dementia without behavioral disturbance: Secondary | ICD-10-CM | POA: Diagnosis not present

## 2020-05-07 DIAGNOSIS — N183 Chronic kidney disease, stage 3 unspecified: Secondary | ICD-10-CM | POA: Diagnosis not present

## 2020-05-07 DIAGNOSIS — I5032 Chronic diastolic (congestive) heart failure: Secondary | ICD-10-CM | POA: Diagnosis not present

## 2020-05-07 DIAGNOSIS — L89622 Pressure ulcer of left heel, stage 2: Secondary | ICD-10-CM | POA: Diagnosis not present

## 2020-05-07 DIAGNOSIS — G40209 Localization-related (focal) (partial) symptomatic epilepsy and epileptic syndromes with complex partial seizures, not intractable, without status epilepticus: Secondary | ICD-10-CM | POA: Diagnosis not present

## 2020-05-07 DIAGNOSIS — I69393 Ataxia following cerebral infarction: Secondary | ICD-10-CM | POA: Diagnosis not present

## 2020-05-07 DIAGNOSIS — N400 Enlarged prostate without lower urinary tract symptoms: Secondary | ICD-10-CM | POA: Diagnosis not present

## 2020-05-07 NOTE — Telephone Encounter (Signed)
agree

## 2020-05-07 NOTE — Telephone Encounter (Signed)
Call from [facility], [place] -requesting verbal orders for "OT once a week x 2 week, then zero time x 1 week , then once a week x 1 week for ADL's, transfers, and exercise." VO given - if not appropriate, let me know. Thanks

## 2020-05-10 DIAGNOSIS — I5032 Chronic diastolic (congestive) heart failure: Secondary | ICD-10-CM | POA: Diagnosis not present

## 2020-05-10 DIAGNOSIS — D329 Benign neoplasm of meninges, unspecified: Secondary | ICD-10-CM | POA: Diagnosis not present

## 2020-05-10 DIAGNOSIS — L89622 Pressure ulcer of left heel, stage 2: Secondary | ICD-10-CM | POA: Diagnosis not present

## 2020-05-10 DIAGNOSIS — I130 Hypertensive heart and chronic kidney disease with heart failure and stage 1 through stage 4 chronic kidney disease, or unspecified chronic kidney disease: Secondary | ICD-10-CM | POA: Diagnosis not present

## 2020-05-10 DIAGNOSIS — F0390 Unspecified dementia without behavioral disturbance: Secondary | ICD-10-CM | POA: Diagnosis not present

## 2020-05-10 DIAGNOSIS — N183 Chronic kidney disease, stage 3 unspecified: Secondary | ICD-10-CM | POA: Diagnosis not present

## 2020-05-10 DIAGNOSIS — N400 Enlarged prostate without lower urinary tract symptoms: Secondary | ICD-10-CM | POA: Diagnosis not present

## 2020-05-10 DIAGNOSIS — I69393 Ataxia following cerebral infarction: Secondary | ICD-10-CM | POA: Diagnosis not present

## 2020-05-10 DIAGNOSIS — G40209 Localization-related (focal) (partial) symptomatic epilepsy and epileptic syndromes with complex partial seizures, not intractable, without status epilepticus: Secondary | ICD-10-CM | POA: Diagnosis not present

## 2020-05-11 DIAGNOSIS — D329 Benign neoplasm of meninges, unspecified: Secondary | ICD-10-CM | POA: Diagnosis not present

## 2020-05-11 DIAGNOSIS — N400 Enlarged prostate without lower urinary tract symptoms: Secondary | ICD-10-CM | POA: Diagnosis not present

## 2020-05-11 DIAGNOSIS — I69393 Ataxia following cerebral infarction: Secondary | ICD-10-CM | POA: Diagnosis not present

## 2020-05-11 DIAGNOSIS — I5032 Chronic diastolic (congestive) heart failure: Secondary | ICD-10-CM | POA: Diagnosis not present

## 2020-05-11 DIAGNOSIS — N183 Chronic kidney disease, stage 3 unspecified: Secondary | ICD-10-CM | POA: Diagnosis not present

## 2020-05-11 DIAGNOSIS — F0390 Unspecified dementia without behavioral disturbance: Secondary | ICD-10-CM | POA: Diagnosis not present

## 2020-05-11 DIAGNOSIS — G40209 Localization-related (focal) (partial) symptomatic epilepsy and epileptic syndromes with complex partial seizures, not intractable, without status epilepticus: Secondary | ICD-10-CM | POA: Diagnosis not present

## 2020-05-11 DIAGNOSIS — L89622 Pressure ulcer of left heel, stage 2: Secondary | ICD-10-CM | POA: Diagnosis not present

## 2020-05-11 DIAGNOSIS — I130 Hypertensive heart and chronic kidney disease with heart failure and stage 1 through stage 4 chronic kidney disease, or unspecified chronic kidney disease: Secondary | ICD-10-CM | POA: Diagnosis not present

## 2020-05-12 DIAGNOSIS — G40209 Localization-related (focal) (partial) symptomatic epilepsy and epileptic syndromes with complex partial seizures, not intractable, without status epilepticus: Secondary | ICD-10-CM | POA: Diagnosis not present

## 2020-05-12 DIAGNOSIS — F0390 Unspecified dementia without behavioral disturbance: Secondary | ICD-10-CM | POA: Diagnosis not present

## 2020-05-12 DIAGNOSIS — L89622 Pressure ulcer of left heel, stage 2: Secondary | ICD-10-CM | POA: Diagnosis not present

## 2020-05-12 DIAGNOSIS — I69393 Ataxia following cerebral infarction: Secondary | ICD-10-CM | POA: Diagnosis not present

## 2020-05-12 DIAGNOSIS — N400 Enlarged prostate without lower urinary tract symptoms: Secondary | ICD-10-CM | POA: Diagnosis not present

## 2020-05-12 DIAGNOSIS — I5032 Chronic diastolic (congestive) heart failure: Secondary | ICD-10-CM | POA: Diagnosis not present

## 2020-05-12 DIAGNOSIS — D329 Benign neoplasm of meninges, unspecified: Secondary | ICD-10-CM | POA: Diagnosis not present

## 2020-05-12 DIAGNOSIS — I130 Hypertensive heart and chronic kidney disease with heart failure and stage 1 through stage 4 chronic kidney disease, or unspecified chronic kidney disease: Secondary | ICD-10-CM | POA: Diagnosis not present

## 2020-05-12 DIAGNOSIS — N183 Chronic kidney disease, stage 3 unspecified: Secondary | ICD-10-CM | POA: Diagnosis not present

## 2020-05-13 ENCOUNTER — Telehealth: Payer: Self-pay | Admitting: *Deleted

## 2020-05-13 DIAGNOSIS — N183 Chronic kidney disease, stage 3 unspecified: Secondary | ICD-10-CM | POA: Diagnosis not present

## 2020-05-13 DIAGNOSIS — I130 Hypertensive heart and chronic kidney disease with heart failure and stage 1 through stage 4 chronic kidney disease, or unspecified chronic kidney disease: Secondary | ICD-10-CM | POA: Diagnosis not present

## 2020-05-13 DIAGNOSIS — I69393 Ataxia following cerebral infarction: Secondary | ICD-10-CM | POA: Diagnosis not present

## 2020-05-13 DIAGNOSIS — G40209 Localization-related (focal) (partial) symptomatic epilepsy and epileptic syndromes with complex partial seizures, not intractable, without status epilepticus: Secondary | ICD-10-CM | POA: Diagnosis not present

## 2020-05-13 DIAGNOSIS — N400 Enlarged prostate without lower urinary tract symptoms: Secondary | ICD-10-CM | POA: Diagnosis not present

## 2020-05-13 DIAGNOSIS — F0390 Unspecified dementia without behavioral disturbance: Secondary | ICD-10-CM | POA: Diagnosis not present

## 2020-05-13 DIAGNOSIS — L89622 Pressure ulcer of left heel, stage 2: Secondary | ICD-10-CM | POA: Diagnosis not present

## 2020-05-13 DIAGNOSIS — D329 Benign neoplasm of meninges, unspecified: Secondary | ICD-10-CM | POA: Diagnosis not present

## 2020-05-13 DIAGNOSIS — I5032 Chronic diastolic (congestive) heart failure: Secondary | ICD-10-CM | POA: Diagnosis not present

## 2020-05-13 NOTE — Telephone Encounter (Signed)
Call from [facility], [place] requesting verbal order. Stated another nurse had received an order for calcium alginate dsg. She's requesting to "Add foam dressing for cushioning to left heel". VO given - if not appropriate, let me know. Thanks

## 2020-05-17 DIAGNOSIS — I130 Hypertensive heart and chronic kidney disease with heart failure and stage 1 through stage 4 chronic kidney disease, or unspecified chronic kidney disease: Secondary | ICD-10-CM | POA: Diagnosis not present

## 2020-05-17 DIAGNOSIS — N400 Enlarged prostate without lower urinary tract symptoms: Secondary | ICD-10-CM | POA: Diagnosis not present

## 2020-05-17 DIAGNOSIS — G40209 Localization-related (focal) (partial) symptomatic epilepsy and epileptic syndromes with complex partial seizures, not intractable, without status epilepticus: Secondary | ICD-10-CM | POA: Diagnosis not present

## 2020-05-17 DIAGNOSIS — F0390 Unspecified dementia without behavioral disturbance: Secondary | ICD-10-CM | POA: Diagnosis not present

## 2020-05-17 DIAGNOSIS — I5032 Chronic diastolic (congestive) heart failure: Secondary | ICD-10-CM | POA: Diagnosis not present

## 2020-05-17 DIAGNOSIS — D329 Benign neoplasm of meninges, unspecified: Secondary | ICD-10-CM | POA: Diagnosis not present

## 2020-05-17 DIAGNOSIS — I69393 Ataxia following cerebral infarction: Secondary | ICD-10-CM | POA: Diagnosis not present

## 2020-05-17 DIAGNOSIS — N183 Chronic kidney disease, stage 3 unspecified: Secondary | ICD-10-CM | POA: Diagnosis not present

## 2020-05-17 DIAGNOSIS — L89622 Pressure ulcer of left heel, stage 2: Secondary | ICD-10-CM | POA: Diagnosis not present

## 2020-05-18 DIAGNOSIS — N183 Chronic kidney disease, stage 3 unspecified: Secondary | ICD-10-CM | POA: Diagnosis not present

## 2020-05-18 DIAGNOSIS — L89622 Pressure ulcer of left heel, stage 2: Secondary | ICD-10-CM | POA: Diagnosis not present

## 2020-05-18 DIAGNOSIS — I69393 Ataxia following cerebral infarction: Secondary | ICD-10-CM | POA: Diagnosis not present

## 2020-05-18 DIAGNOSIS — F0390 Unspecified dementia without behavioral disturbance: Secondary | ICD-10-CM | POA: Diagnosis not present

## 2020-05-18 DIAGNOSIS — I5032 Chronic diastolic (congestive) heart failure: Secondary | ICD-10-CM | POA: Diagnosis not present

## 2020-05-18 DIAGNOSIS — G40209 Localization-related (focal) (partial) symptomatic epilepsy and epileptic syndromes with complex partial seizures, not intractable, without status epilepticus: Secondary | ICD-10-CM | POA: Diagnosis not present

## 2020-05-18 DIAGNOSIS — D329 Benign neoplasm of meninges, unspecified: Secondary | ICD-10-CM | POA: Diagnosis not present

## 2020-05-18 DIAGNOSIS — I130 Hypertensive heart and chronic kidney disease with heart failure and stage 1 through stage 4 chronic kidney disease, or unspecified chronic kidney disease: Secondary | ICD-10-CM | POA: Diagnosis not present

## 2020-05-18 DIAGNOSIS — N400 Enlarged prostate without lower urinary tract symptoms: Secondary | ICD-10-CM | POA: Diagnosis not present

## 2020-05-20 ENCOUNTER — Telehealth: Payer: Self-pay | Admitting: Internal Medicine

## 2020-05-20 DIAGNOSIS — I69393 Ataxia following cerebral infarction: Secondary | ICD-10-CM | POA: Diagnosis not present

## 2020-05-20 DIAGNOSIS — G40209 Localization-related (focal) (partial) symptomatic epilepsy and epileptic syndromes with complex partial seizures, not intractable, without status epilepticus: Secondary | ICD-10-CM | POA: Diagnosis not present

## 2020-05-20 DIAGNOSIS — I5032 Chronic diastolic (congestive) heart failure: Secondary | ICD-10-CM | POA: Diagnosis not present

## 2020-05-20 DIAGNOSIS — L89622 Pressure ulcer of left heel, stage 2: Secondary | ICD-10-CM | POA: Diagnosis not present

## 2020-05-20 DIAGNOSIS — N400 Enlarged prostate without lower urinary tract symptoms: Secondary | ICD-10-CM | POA: Diagnosis not present

## 2020-05-20 DIAGNOSIS — N183 Chronic kidney disease, stage 3 unspecified: Secondary | ICD-10-CM | POA: Diagnosis not present

## 2020-05-20 DIAGNOSIS — I130 Hypertensive heart and chronic kidney disease with heart failure and stage 1 through stage 4 chronic kidney disease, or unspecified chronic kidney disease: Secondary | ICD-10-CM | POA: Diagnosis not present

## 2020-05-20 DIAGNOSIS — F0390 Unspecified dementia without behavioral disturbance: Secondary | ICD-10-CM | POA: Diagnosis not present

## 2020-05-20 DIAGNOSIS — D329 Benign neoplasm of meninges, unspecified: Secondary | ICD-10-CM | POA: Diagnosis not present

## 2020-05-20 NOTE — Telephone Encounter (Signed)
[place] D. Called to report   The condition of the patient's Wound.  Please call back to [phone]

## 2020-05-20 NOTE — Telephone Encounter (Signed)
Return call to [facility], [place] - stated the daughter did not changed pt's dressing on his left heel on Monday (05/17/20) as planned. The nurse stated the daughter stated the dressing looked good, she forgot to change it. So the nurse changed it today; stated the wound looks great. Stated the daughter stated she will change it next Monday and the nurse will see pt next Thursday.

## 2020-05-21 IMAGING — CT CT HEAD WITHOUT CONTRAST
4 series · 16 of 47 positions shown, 18 images · non-contrast
Comparison: None.

CLINICAL DATA: Left arm weakness.

EXAM:
CT HEAD WITHOUT CONTRAST
TECHNIQUE: Contiguous axial images were obtained from the base of the skull
through the vertex without intravenous contrast.

[Series 3: head wo · axial · 0.46mm/px · z∈[+1174,+1294]mm · 6 of 34 slices shown, 8 images]
[im 5/34  brain]
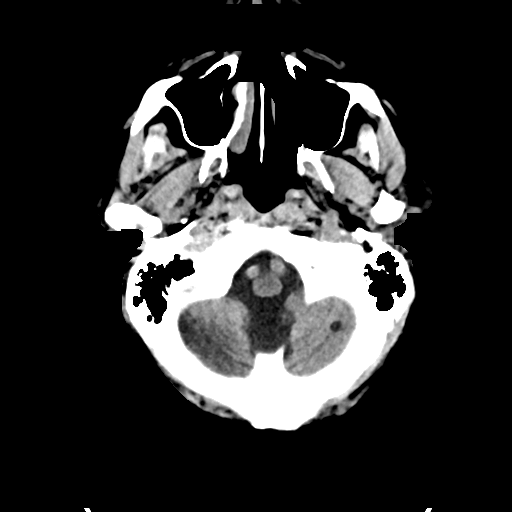
[im 5/34  bone]
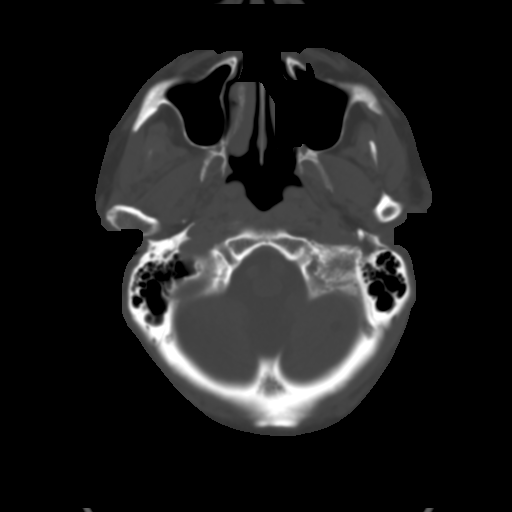
[im 10/34  brain]
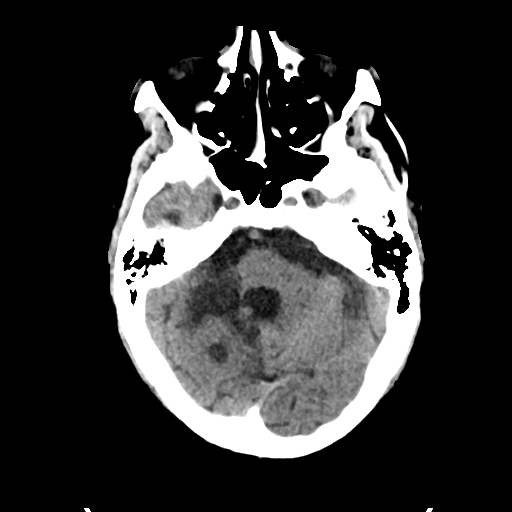
[im 15/34  brain]
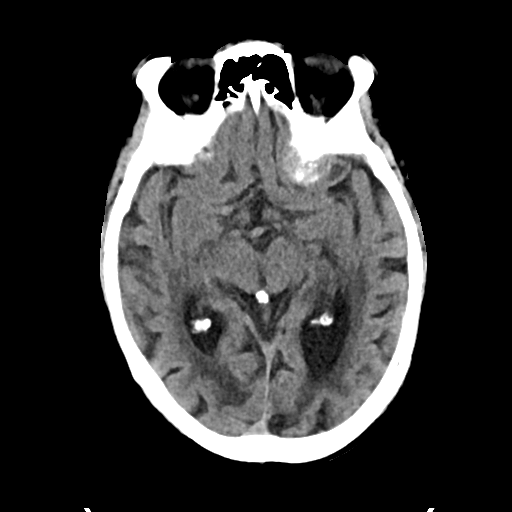
[im 19/34  brain]
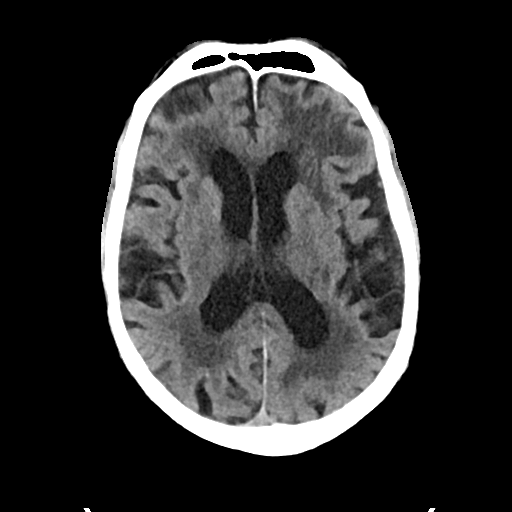
[im 24/34  brain]
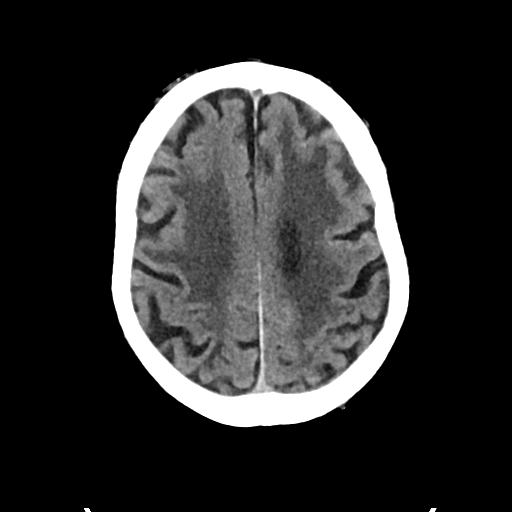
[im 24/34  bone]
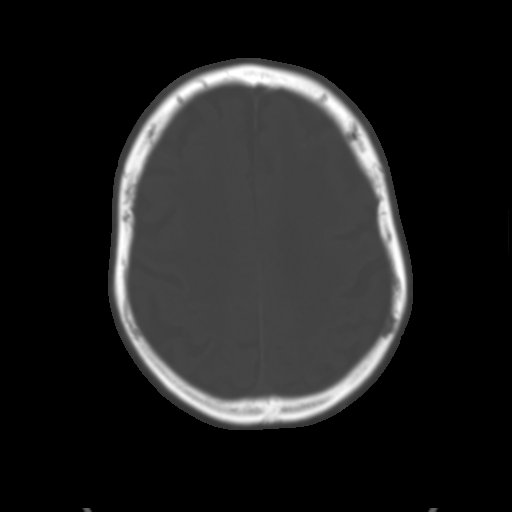
[im 29/34  brain]
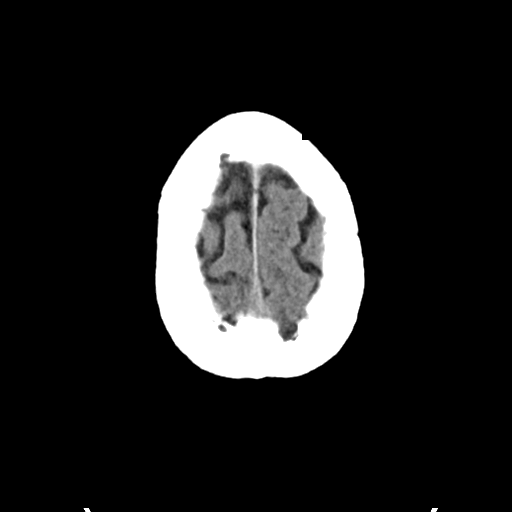

[Series 4: head bone · axial · 0.46mm/px · z∈[+1170,+1226]mm · 4 of 87 slices shown]
[im 9/87  bone]
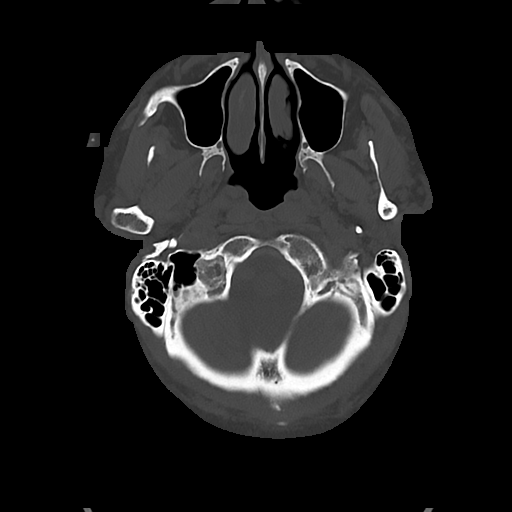
[im 17/87  bone]
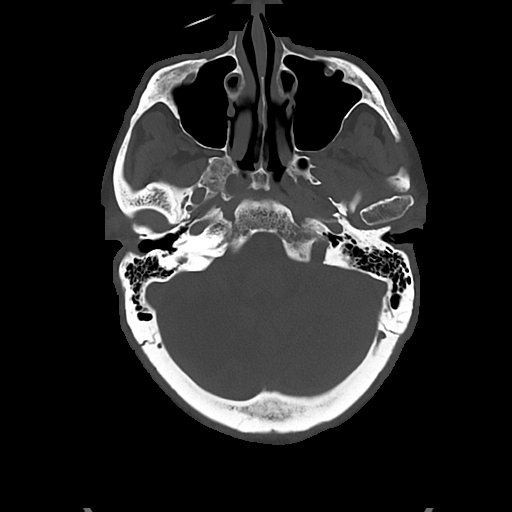
[im 29/87  bone]
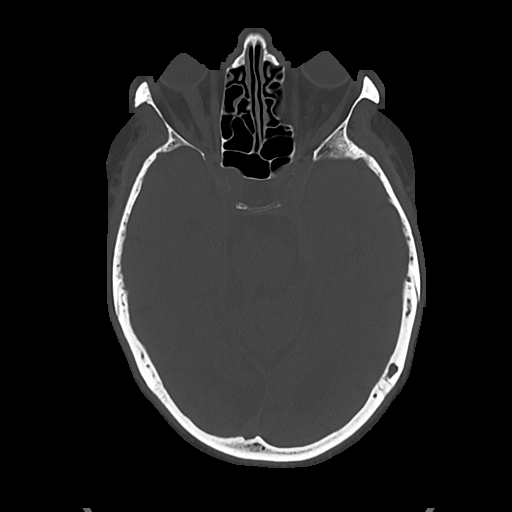
[im 37/87  bone]
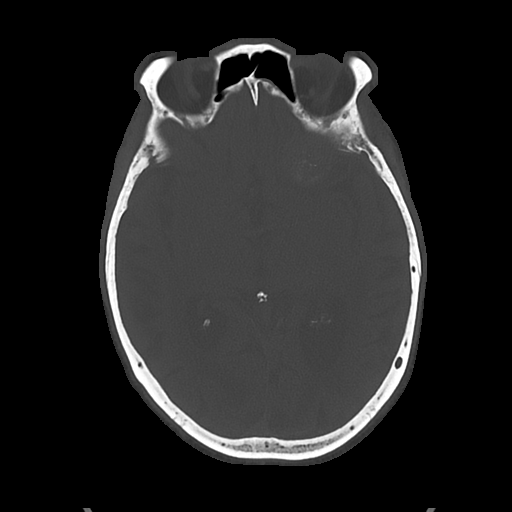

[Series 5: cor soft · coronal · 0.37mm/px · 3 of 75 slices shown]
[im 25/75  brain]
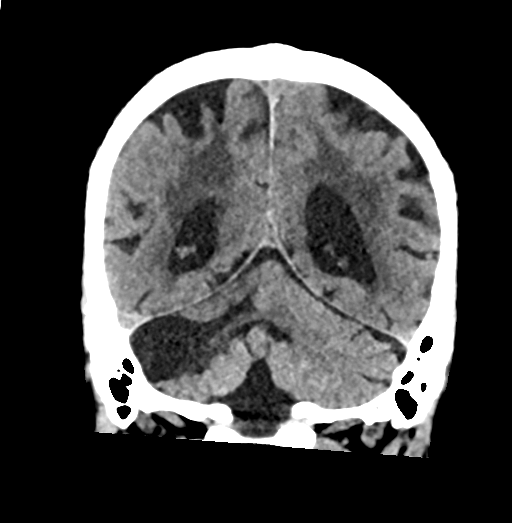
[im 33/75  brain]
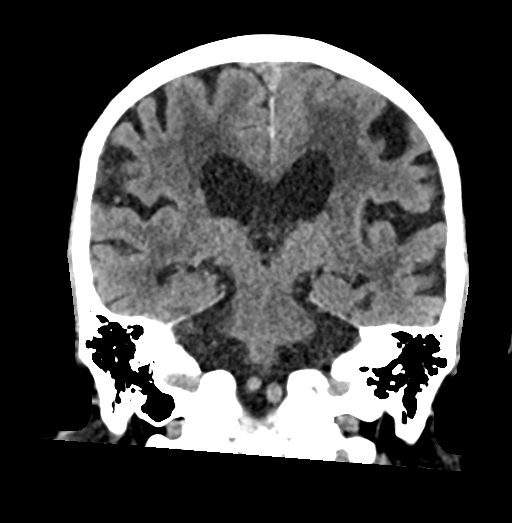
[im 42/75  brain]
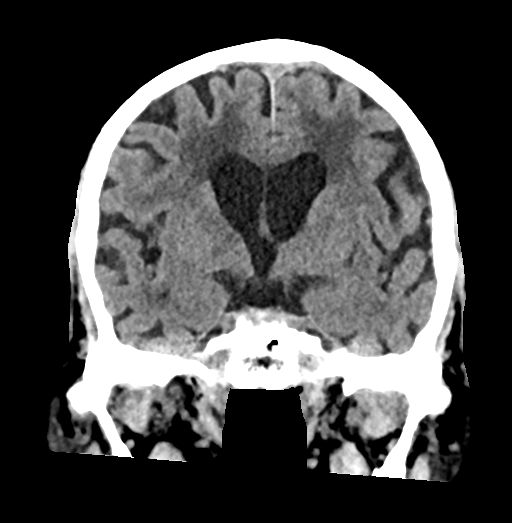

[Series 6: sag soft · sagittal · 0.37mm/px · 3 of 63 slices shown]
[im 21/63  brain]
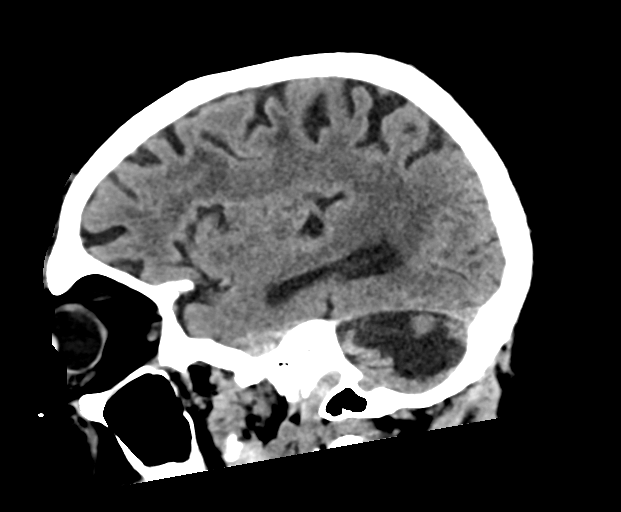
[im 32/63  brain]
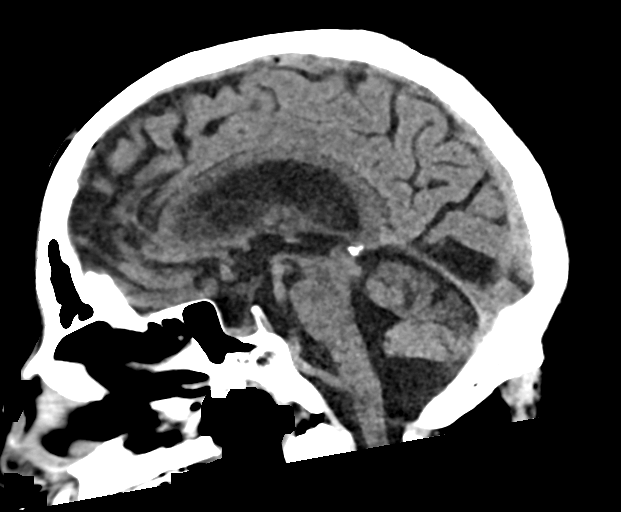
[im 42/63  brain]
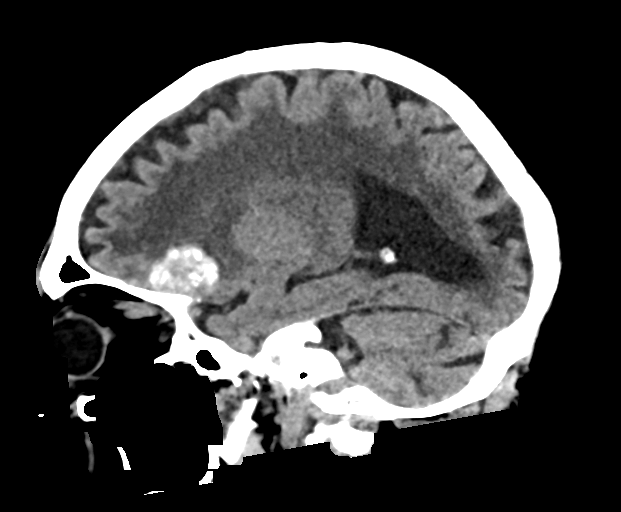

[16 of 47 positions shown; findings below may reference images not displayed]

FINDINGS: Brain: Similar appearance of diffuse atrophy and advanced chronic
small vessel white matter disease. Calcified left frontal mass
measured previously at 2.8 cm is stable, measuring 2.7 cm today.
Imaging features remain compatible with calcified meningioma.
Encephalomalacia in the right cerebellum is consistent with old
infarct. The could or infarcts noted in the left cerebellum and
basal ganglia.

Vascular: No hyperdense vessel or unexpected calcification.

Skull: No evidence for fracture. No worrisome lytic or sclerotic
lesion.

Sinuses/Orbits: The visualized paranasal sinuses and mastoid air
cells are clear. Visualized portions of the globes and intraorbital
fat are unremarkable.

Other: None.
IMPRESSION: 1. Stable exam without new or acute interval finding.
2. Stable 2.7 cm calcified meningioma of the left sphenoid wing.
3. Atrophy with advanced chronic small vessel white matter ischemic
disease.
4. Prominent encephalomalacia in the right cerebellum consistent
with old infarct. Lacunar infarcts are seen in the cerebellar
hemispheres bilaterally.

## 2020-05-21 NOTE — Telephone Encounter (Signed)
Great, thank you!

## 2020-05-25 ENCOUNTER — Other Ambulatory Visit: Payer: Self-pay

## 2020-05-25 DIAGNOSIS — D329 Benign neoplasm of meninges, unspecified: Secondary | ICD-10-CM | POA: Diagnosis not present

## 2020-05-25 DIAGNOSIS — R569 Unspecified convulsions: Secondary | ICD-10-CM

## 2020-05-25 DIAGNOSIS — I5032 Chronic diastolic (congestive) heart failure: Secondary | ICD-10-CM | POA: Diagnosis not present

## 2020-05-25 DIAGNOSIS — N183 Chronic kidney disease, stage 3 unspecified: Secondary | ICD-10-CM | POA: Diagnosis not present

## 2020-05-25 DIAGNOSIS — I10 Essential (primary) hypertension: Secondary | ICD-10-CM

## 2020-05-25 DIAGNOSIS — I69393 Ataxia following cerebral infarction: Secondary | ICD-10-CM | POA: Diagnosis not present

## 2020-05-25 DIAGNOSIS — N400 Enlarged prostate without lower urinary tract symptoms: Secondary | ICD-10-CM | POA: Diagnosis not present

## 2020-05-25 DIAGNOSIS — F0390 Unspecified dementia without behavioral disturbance: Secondary | ICD-10-CM | POA: Diagnosis not present

## 2020-05-25 DIAGNOSIS — L89622 Pressure ulcer of left heel, stage 2: Secondary | ICD-10-CM | POA: Diagnosis not present

## 2020-05-25 DIAGNOSIS — G40209 Localization-related (focal) (partial) symptomatic epilepsy and epileptic syndromes with complex partial seizures, not intractable, without status epilepticus: Secondary | ICD-10-CM | POA: Diagnosis not present

## 2020-05-25 DIAGNOSIS — I130 Hypertensive heart and chronic kidney disease with heart failure and stage 1 through stage 4 chronic kidney disease, or unspecified chronic kidney disease: Secondary | ICD-10-CM | POA: Diagnosis not present

## 2020-05-25 MED ORDER — AMLODIPINE BESYLATE 5 MG PO TABS
5.0000 mg | ORAL_TABLET | Freq: Every day | ORAL | 3 refills | Status: DC
Start: 2020-05-25 — End: 2020-07-15

## 2020-05-25 MED ORDER — ASPIRIN 81 MG PO TBEC: 81.0000 mg | DELAYED_RELEASE_TABLET | Freq: Every day | ORAL | 3 refills | Status: DC

## 2020-05-25 MED ORDER — ACETAMINOPHEN 500 MG PO TABS: 500.0000 mg | ORAL_TABLET | Freq: Four times a day (QID) | ORAL | 11 refills | Status: DC | PRN

## 2020-05-25 MED ORDER — ATORVASTATIN CALCIUM 20 MG PO TABS: 20.0000 mg | ORAL_TABLET | Freq: Every day | ORAL | 3 refills | Status: DC

## 2020-05-25 MED ORDER — FUROSEMIDE 40 MG PO TABS: 80.0000 mg | ORAL_TABLET | Freq: Every day | ORAL | 1 refills | Status: DC | PRN

## 2020-05-25 MED ORDER — LEVETIRACETAM ER 500 MG PO TB24: 1000.0000 mg | ORAL_TABLET | Freq: Every day | ORAL | 1 refills | Status: DC

## 2020-05-25 NOTE — Telephone Encounter (Signed)
I called [facility] - stated they do not have current insurance on file.  I called pt's daughter,[name] - stated she recently changed to [place] since now pt has [facility] and stated they will pre-package the pt's medications.

## 2020-05-25 NOTE — Telephone Encounter (Signed)
[name] with [facility] pharmacy requesting refill on    acetaminophen (TYLENOL) 500 MG tablet  amLODipine (NORVASC) 5 MG tablet  aspirin EC 81 MG EC tablet  atorvastatin (LIPITOR) 20 MG tablet  furosemide (LASIX) 40 MG tablet  levETIRAcetam (KEPPRA XR) 500 MG 24 hr tablet.

## 2020-05-27 DIAGNOSIS — L89622 Pressure ulcer of left heel, stage 2: Secondary | ICD-10-CM | POA: Diagnosis not present

## 2020-05-27 DIAGNOSIS — N183 Chronic kidney disease, stage 3 unspecified: Secondary | ICD-10-CM | POA: Diagnosis not present

## 2020-05-27 DIAGNOSIS — D329 Benign neoplasm of meninges, unspecified: Secondary | ICD-10-CM | POA: Diagnosis not present

## 2020-05-27 DIAGNOSIS — F0390 Unspecified dementia without behavioral disturbance: Secondary | ICD-10-CM | POA: Diagnosis not present

## 2020-05-27 DIAGNOSIS — I5032 Chronic diastolic (congestive) heart failure: Secondary | ICD-10-CM | POA: Diagnosis not present

## 2020-05-27 DIAGNOSIS — N400 Enlarged prostate without lower urinary tract symptoms: Secondary | ICD-10-CM | POA: Diagnosis not present

## 2020-05-27 DIAGNOSIS — I130 Hypertensive heart and chronic kidney disease with heart failure and stage 1 through stage 4 chronic kidney disease, or unspecified chronic kidney disease: Secondary | ICD-10-CM | POA: Diagnosis not present

## 2020-05-27 DIAGNOSIS — G40209 Localization-related (focal) (partial) symptomatic epilepsy and epileptic syndromes with complex partial seizures, not intractable, without status epilepticus: Secondary | ICD-10-CM | POA: Diagnosis not present

## 2020-05-27 DIAGNOSIS — I69393 Ataxia following cerebral infarction: Secondary | ICD-10-CM | POA: Diagnosis not present

## 2020-06-01 DIAGNOSIS — G40209 Localization-related (focal) (partial) symptomatic epilepsy and epileptic syndromes with complex partial seizures, not intractable, without status epilepticus: Secondary | ICD-10-CM | POA: Diagnosis not present

## 2020-06-01 DIAGNOSIS — D329 Benign neoplasm of meninges, unspecified: Secondary | ICD-10-CM | POA: Diagnosis not present

## 2020-06-01 DIAGNOSIS — I69393 Ataxia following cerebral infarction: Secondary | ICD-10-CM | POA: Diagnosis not present

## 2020-06-01 DIAGNOSIS — N183 Chronic kidney disease, stage 3 unspecified: Secondary | ICD-10-CM | POA: Diagnosis not present

## 2020-06-01 DIAGNOSIS — F0390 Unspecified dementia without behavioral disturbance: Secondary | ICD-10-CM | POA: Diagnosis not present

## 2020-06-01 DIAGNOSIS — N400 Enlarged prostate without lower urinary tract symptoms: Secondary | ICD-10-CM | POA: Diagnosis not present

## 2020-06-01 DIAGNOSIS — L89622 Pressure ulcer of left heel, stage 2: Secondary | ICD-10-CM | POA: Diagnosis not present

## 2020-06-01 DIAGNOSIS — I130 Hypertensive heart and chronic kidney disease with heart failure and stage 1 through stage 4 chronic kidney disease, or unspecified chronic kidney disease: Secondary | ICD-10-CM | POA: Diagnosis not present

## 2020-06-01 DIAGNOSIS — I5032 Chronic diastolic (congestive) heart failure: Secondary | ICD-10-CM | POA: Diagnosis not present

## 2020-06-02 DIAGNOSIS — M6281 Muscle weakness (generalized): Secondary | ICD-10-CM | POA: Diagnosis not present

## 2020-06-02 DIAGNOSIS — L89622 Pressure ulcer of left heel, stage 2: Secondary | ICD-10-CM | POA: Diagnosis not present

## 2020-06-02 DIAGNOSIS — Z9181 History of falling: Secondary | ICD-10-CM | POA: Diagnosis not present

## 2020-06-02 DIAGNOSIS — G4089 Other seizures: Secondary | ICD-10-CM | POA: Diagnosis not present

## 2020-06-02 DIAGNOSIS — I5032 Chronic diastolic (congestive) heart failure: Secondary | ICD-10-CM | POA: Diagnosis not present

## 2020-06-02 DIAGNOSIS — F0150 Vascular dementia without behavioral disturbance: Secondary | ICD-10-CM | POA: Diagnosis not present

## 2020-06-02 DIAGNOSIS — U071 COVID-19: Secondary | ICD-10-CM | POA: Diagnosis not present

## 2020-06-03 DIAGNOSIS — I130 Hypertensive heart and chronic kidney disease with heart failure and stage 1 through stage 4 chronic kidney disease, or unspecified chronic kidney disease: Secondary | ICD-10-CM | POA: Diagnosis not present

## 2020-06-03 DIAGNOSIS — N400 Enlarged prostate without lower urinary tract symptoms: Secondary | ICD-10-CM | POA: Diagnosis not present

## 2020-06-03 DIAGNOSIS — I5032 Chronic diastolic (congestive) heart failure: Secondary | ICD-10-CM | POA: Diagnosis not present

## 2020-06-03 DIAGNOSIS — F0390 Unspecified dementia without behavioral disturbance: Secondary | ICD-10-CM | POA: Diagnosis not present

## 2020-06-03 DIAGNOSIS — G40209 Localization-related (focal) (partial) symptomatic epilepsy and epileptic syndromes with complex partial seizures, not intractable, without status epilepticus: Secondary | ICD-10-CM | POA: Diagnosis not present

## 2020-06-03 DIAGNOSIS — D329 Benign neoplasm of meninges, unspecified: Secondary | ICD-10-CM | POA: Diagnosis not present

## 2020-06-03 DIAGNOSIS — N183 Chronic kidney disease, stage 3 unspecified: Secondary | ICD-10-CM | POA: Diagnosis not present

## 2020-06-03 DIAGNOSIS — L89622 Pressure ulcer of left heel, stage 2: Secondary | ICD-10-CM | POA: Diagnosis not present

## 2020-06-03 DIAGNOSIS — I69393 Ataxia following cerebral infarction: Secondary | ICD-10-CM | POA: Diagnosis not present

## 2020-06-04 DIAGNOSIS — I130 Hypertensive heart and chronic kidney disease with heart failure and stage 1 through stage 4 chronic kidney disease, or unspecified chronic kidney disease: Secondary | ICD-10-CM | POA: Diagnosis not present

## 2020-06-04 DIAGNOSIS — D329 Benign neoplasm of meninges, unspecified: Secondary | ICD-10-CM | POA: Diagnosis not present

## 2020-06-04 DIAGNOSIS — N400 Enlarged prostate without lower urinary tract symptoms: Secondary | ICD-10-CM | POA: Diagnosis not present

## 2020-06-04 DIAGNOSIS — G40209 Localization-related (focal) (partial) symptomatic epilepsy and epileptic syndromes with complex partial seizures, not intractable, without status epilepticus: Secondary | ICD-10-CM | POA: Diagnosis not present

## 2020-06-04 DIAGNOSIS — I5032 Chronic diastolic (congestive) heart failure: Secondary | ICD-10-CM | POA: Diagnosis not present

## 2020-06-04 DIAGNOSIS — L89622 Pressure ulcer of left heel, stage 2: Secondary | ICD-10-CM | POA: Diagnosis not present

## 2020-06-04 DIAGNOSIS — I69393 Ataxia following cerebral infarction: Secondary | ICD-10-CM | POA: Diagnosis not present

## 2020-06-04 DIAGNOSIS — N183 Chronic kidney disease, stage 3 unspecified: Secondary | ICD-10-CM | POA: Diagnosis not present

## 2020-06-04 DIAGNOSIS — F0390 Unspecified dementia without behavioral disturbance: Secondary | ICD-10-CM | POA: Diagnosis not present

## 2020-06-10 DIAGNOSIS — D329 Benign neoplasm of meninges, unspecified: Secondary | ICD-10-CM | POA: Diagnosis not present

## 2020-06-10 DIAGNOSIS — N400 Enlarged prostate without lower urinary tract symptoms: Secondary | ICD-10-CM | POA: Diagnosis not present

## 2020-06-10 DIAGNOSIS — I69393 Ataxia following cerebral infarction: Secondary | ICD-10-CM | POA: Diagnosis not present

## 2020-06-10 DIAGNOSIS — F0390 Unspecified dementia without behavioral disturbance: Secondary | ICD-10-CM | POA: Diagnosis not present

## 2020-06-10 DIAGNOSIS — I5032 Chronic diastolic (congestive) heart failure: Secondary | ICD-10-CM | POA: Diagnosis not present

## 2020-06-10 DIAGNOSIS — L89622 Pressure ulcer of left heel, stage 2: Secondary | ICD-10-CM | POA: Diagnosis not present

## 2020-06-10 DIAGNOSIS — I130 Hypertensive heart and chronic kidney disease with heart failure and stage 1 through stage 4 chronic kidney disease, or unspecified chronic kidney disease: Secondary | ICD-10-CM | POA: Diagnosis not present

## 2020-06-10 DIAGNOSIS — G40209 Localization-related (focal) (partial) symptomatic epilepsy and epileptic syndromes with complex partial seizures, not intractable, without status epilepticus: Secondary | ICD-10-CM | POA: Diagnosis not present

## 2020-06-10 DIAGNOSIS — N183 Chronic kidney disease, stage 3 unspecified: Secondary | ICD-10-CM | POA: Diagnosis not present

## 2020-06-16 DIAGNOSIS — N400 Enlarged prostate without lower urinary tract symptoms: Secondary | ICD-10-CM | POA: Diagnosis not present

## 2020-06-16 DIAGNOSIS — F0390 Unspecified dementia without behavioral disturbance: Secondary | ICD-10-CM | POA: Diagnosis not present

## 2020-06-16 DIAGNOSIS — G40209 Localization-related (focal) (partial) symptomatic epilepsy and epileptic syndromes with complex partial seizures, not intractable, without status epilepticus: Secondary | ICD-10-CM | POA: Diagnosis not present

## 2020-06-16 DIAGNOSIS — D329 Benign neoplasm of meninges, unspecified: Secondary | ICD-10-CM | POA: Diagnosis not present

## 2020-06-16 DIAGNOSIS — I5032 Chronic diastolic (congestive) heart failure: Secondary | ICD-10-CM | POA: Diagnosis not present

## 2020-06-16 DIAGNOSIS — I130 Hypertensive heart and chronic kidney disease with heart failure and stage 1 through stage 4 chronic kidney disease, or unspecified chronic kidney disease: Secondary | ICD-10-CM | POA: Diagnosis not present

## 2020-06-16 DIAGNOSIS — I69393 Ataxia following cerebral infarction: Secondary | ICD-10-CM | POA: Diagnosis not present

## 2020-06-16 DIAGNOSIS — L89622 Pressure ulcer of left heel, stage 2: Secondary | ICD-10-CM | POA: Diagnosis not present

## 2020-06-16 DIAGNOSIS — N183 Chronic kidney disease, stage 3 unspecified: Secondary | ICD-10-CM | POA: Diagnosis not present

## 2020-06-17 DIAGNOSIS — N183 Chronic kidney disease, stage 3 unspecified: Secondary | ICD-10-CM | POA: Diagnosis not present

## 2020-06-17 DIAGNOSIS — N400 Enlarged prostate without lower urinary tract symptoms: Secondary | ICD-10-CM | POA: Diagnosis not present

## 2020-06-17 DIAGNOSIS — G40209 Localization-related (focal) (partial) symptomatic epilepsy and epileptic syndromes with complex partial seizures, not intractable, without status epilepticus: Secondary | ICD-10-CM | POA: Diagnosis not present

## 2020-06-17 DIAGNOSIS — I5032 Chronic diastolic (congestive) heart failure: Secondary | ICD-10-CM | POA: Diagnosis not present

## 2020-06-17 DIAGNOSIS — D329 Benign neoplasm of meninges, unspecified: Secondary | ICD-10-CM | POA: Diagnosis not present

## 2020-06-17 DIAGNOSIS — L89622 Pressure ulcer of left heel, stage 2: Secondary | ICD-10-CM | POA: Diagnosis not present

## 2020-06-17 DIAGNOSIS — I69393 Ataxia following cerebral infarction: Secondary | ICD-10-CM | POA: Diagnosis not present

## 2020-06-17 DIAGNOSIS — F0390 Unspecified dementia without behavioral disturbance: Secondary | ICD-10-CM | POA: Diagnosis not present

## 2020-06-17 DIAGNOSIS — I130 Hypertensive heart and chronic kidney disease with heart failure and stage 1 through stage 4 chronic kidney disease, or unspecified chronic kidney disease: Secondary | ICD-10-CM | POA: Diagnosis not present

## 2020-06-23 DIAGNOSIS — L89622 Pressure ulcer of left heel, stage 2: Secondary | ICD-10-CM | POA: Diagnosis not present

## 2020-06-23 DIAGNOSIS — F0390 Unspecified dementia without behavioral disturbance: Secondary | ICD-10-CM | POA: Diagnosis not present

## 2020-06-23 DIAGNOSIS — N400 Enlarged prostate without lower urinary tract symptoms: Secondary | ICD-10-CM | POA: Diagnosis not present

## 2020-06-23 DIAGNOSIS — I69393 Ataxia following cerebral infarction: Secondary | ICD-10-CM | POA: Diagnosis not present

## 2020-06-23 DIAGNOSIS — N183 Chronic kidney disease, stage 3 unspecified: Secondary | ICD-10-CM | POA: Diagnosis not present

## 2020-06-23 DIAGNOSIS — G40209 Localization-related (focal) (partial) symptomatic epilepsy and epileptic syndromes with complex partial seizures, not intractable, without status epilepticus: Secondary | ICD-10-CM | POA: Diagnosis not present

## 2020-06-23 DIAGNOSIS — I5032 Chronic diastolic (congestive) heart failure: Secondary | ICD-10-CM | POA: Diagnosis not present

## 2020-06-23 DIAGNOSIS — I130 Hypertensive heart and chronic kidney disease with heart failure and stage 1 through stage 4 chronic kidney disease, or unspecified chronic kidney disease: Secondary | ICD-10-CM | POA: Diagnosis not present

## 2020-06-23 DIAGNOSIS — D329 Benign neoplasm of meninges, unspecified: Secondary | ICD-10-CM | POA: Diagnosis not present

## 2020-06-24 DIAGNOSIS — G40209 Localization-related (focal) (partial) symptomatic epilepsy and epileptic syndromes with complex partial seizures, not intractable, without status epilepticus: Secondary | ICD-10-CM | POA: Diagnosis not present

## 2020-06-24 DIAGNOSIS — N400 Enlarged prostate without lower urinary tract symptoms: Secondary | ICD-10-CM | POA: Diagnosis not present

## 2020-06-24 DIAGNOSIS — L89622 Pressure ulcer of left heel, stage 2: Secondary | ICD-10-CM | POA: Diagnosis not present

## 2020-06-24 DIAGNOSIS — I130 Hypertensive heart and chronic kidney disease with heart failure and stage 1 through stage 4 chronic kidney disease, or unspecified chronic kidney disease: Secondary | ICD-10-CM | POA: Diagnosis not present

## 2020-06-24 DIAGNOSIS — D329 Benign neoplasm of meninges, unspecified: Secondary | ICD-10-CM | POA: Diagnosis not present

## 2020-06-24 DIAGNOSIS — I5032 Chronic diastolic (congestive) heart failure: Secondary | ICD-10-CM | POA: Diagnosis not present

## 2020-06-24 DIAGNOSIS — N183 Chronic kidney disease, stage 3 unspecified: Secondary | ICD-10-CM | POA: Diagnosis not present

## 2020-06-24 DIAGNOSIS — I69393 Ataxia following cerebral infarction: Secondary | ICD-10-CM | POA: Diagnosis not present

## 2020-06-24 DIAGNOSIS — F0390 Unspecified dementia without behavioral disturbance: Secondary | ICD-10-CM | POA: Diagnosis not present

## 2020-06-29 DIAGNOSIS — D329 Benign neoplasm of meninges, unspecified: Secondary | ICD-10-CM | POA: Diagnosis not present

## 2020-06-29 DIAGNOSIS — N183 Chronic kidney disease, stage 3 unspecified: Secondary | ICD-10-CM | POA: Diagnosis not present

## 2020-06-29 DIAGNOSIS — I5032 Chronic diastolic (congestive) heart failure: Secondary | ICD-10-CM | POA: Diagnosis not present

## 2020-06-29 DIAGNOSIS — I130 Hypertensive heart and chronic kidney disease with heart failure and stage 1 through stage 4 chronic kidney disease, or unspecified chronic kidney disease: Secondary | ICD-10-CM | POA: Diagnosis not present

## 2020-06-29 DIAGNOSIS — I69393 Ataxia following cerebral infarction: Secondary | ICD-10-CM | POA: Diagnosis not present

## 2020-06-29 DIAGNOSIS — G40209 Localization-related (focal) (partial) symptomatic epilepsy and epileptic syndromes with complex partial seizures, not intractable, without status epilepticus: Secondary | ICD-10-CM | POA: Diagnosis not present

## 2020-06-29 DIAGNOSIS — L89622 Pressure ulcer of left heel, stage 2: Secondary | ICD-10-CM | POA: Diagnosis not present

## 2020-06-29 DIAGNOSIS — F0150 Vascular dementia without behavioral disturbance: Secondary | ICD-10-CM | POA: Diagnosis not present

## 2020-06-29 DIAGNOSIS — N400 Enlarged prostate without lower urinary tract symptoms: Secondary | ICD-10-CM | POA: Diagnosis not present

## 2020-07-01 ENCOUNTER — Encounter: Payer: Medicare HMO | Admitting: Internal Medicine

## 2020-07-01 DIAGNOSIS — N400 Enlarged prostate without lower urinary tract symptoms: Secondary | ICD-10-CM | POA: Diagnosis not present

## 2020-07-01 DIAGNOSIS — N183 Chronic kidney disease, stage 3 unspecified: Secondary | ICD-10-CM | POA: Diagnosis not present

## 2020-07-01 DIAGNOSIS — D329 Benign neoplasm of meninges, unspecified: Secondary | ICD-10-CM | POA: Diagnosis not present

## 2020-07-01 DIAGNOSIS — L89622 Pressure ulcer of left heel, stage 2: Secondary | ICD-10-CM | POA: Diagnosis not present

## 2020-07-01 DIAGNOSIS — I69393 Ataxia following cerebral infarction: Secondary | ICD-10-CM | POA: Diagnosis not present

## 2020-07-01 DIAGNOSIS — I5032 Chronic diastolic (congestive) heart failure: Secondary | ICD-10-CM | POA: Diagnosis not present

## 2020-07-01 DIAGNOSIS — F0150 Vascular dementia without behavioral disturbance: Secondary | ICD-10-CM | POA: Diagnosis not present

## 2020-07-01 DIAGNOSIS — I130 Hypertensive heart and chronic kidney disease with heart failure and stage 1 through stage 4 chronic kidney disease, or unspecified chronic kidney disease: Secondary | ICD-10-CM | POA: Diagnosis not present

## 2020-07-01 DIAGNOSIS — G40209 Localization-related (focal) (partial) symptomatic epilepsy and epileptic syndromes with complex partial seizures, not intractable, without status epilepticus: Secondary | ICD-10-CM | POA: Diagnosis not present

## 2020-07-03 DIAGNOSIS — I5032 Chronic diastolic (congestive) heart failure: Secondary | ICD-10-CM | POA: Diagnosis not present

## 2020-07-03 DIAGNOSIS — G4089 Other seizures: Secondary | ICD-10-CM | POA: Diagnosis not present

## 2020-07-03 DIAGNOSIS — Z9181 History of falling: Secondary | ICD-10-CM | POA: Diagnosis not present

## 2020-07-03 DIAGNOSIS — F0150 Vascular dementia without behavioral disturbance: Secondary | ICD-10-CM | POA: Diagnosis not present

## 2020-07-03 DIAGNOSIS — U071 COVID-19: Secondary | ICD-10-CM | POA: Diagnosis not present

## 2020-07-03 DIAGNOSIS — M6281 Muscle weakness (generalized): Secondary | ICD-10-CM | POA: Diagnosis not present

## 2020-07-03 DIAGNOSIS — L89622 Pressure ulcer of left heel, stage 2: Secondary | ICD-10-CM | POA: Diagnosis not present

## 2020-07-05 DIAGNOSIS — N400 Enlarged prostate without lower urinary tract symptoms: Secondary | ICD-10-CM | POA: Diagnosis not present

## 2020-07-05 DIAGNOSIS — I69393 Ataxia following cerebral infarction: Secondary | ICD-10-CM | POA: Diagnosis not present

## 2020-07-05 DIAGNOSIS — F0150 Vascular dementia without behavioral disturbance: Secondary | ICD-10-CM | POA: Diagnosis not present

## 2020-07-05 DIAGNOSIS — I130 Hypertensive heart and chronic kidney disease with heart failure and stage 1 through stage 4 chronic kidney disease, or unspecified chronic kidney disease: Secondary | ICD-10-CM | POA: Diagnosis not present

## 2020-07-05 DIAGNOSIS — G40209 Localization-related (focal) (partial) symptomatic epilepsy and epileptic syndromes with complex partial seizures, not intractable, without status epilepticus: Secondary | ICD-10-CM | POA: Diagnosis not present

## 2020-07-05 DIAGNOSIS — N183 Chronic kidney disease, stage 3 unspecified: Secondary | ICD-10-CM | POA: Diagnosis not present

## 2020-07-05 DIAGNOSIS — D329 Benign neoplasm of meninges, unspecified: Secondary | ICD-10-CM | POA: Diagnosis not present

## 2020-07-05 DIAGNOSIS — L89622 Pressure ulcer of left heel, stage 2: Secondary | ICD-10-CM | POA: Diagnosis not present

## 2020-07-05 DIAGNOSIS — I5032 Chronic diastolic (congestive) heart failure: Secondary | ICD-10-CM | POA: Diagnosis not present

## 2020-07-08 DIAGNOSIS — I130 Hypertensive heart and chronic kidney disease with heart failure and stage 1 through stage 4 chronic kidney disease, or unspecified chronic kidney disease: Secondary | ICD-10-CM | POA: Diagnosis not present

## 2020-07-08 DIAGNOSIS — I69393 Ataxia following cerebral infarction: Secondary | ICD-10-CM | POA: Diagnosis not present

## 2020-07-08 DIAGNOSIS — G40209 Localization-related (focal) (partial) symptomatic epilepsy and epileptic syndromes with complex partial seizures, not intractable, without status epilepticus: Secondary | ICD-10-CM | POA: Diagnosis not present

## 2020-07-08 DIAGNOSIS — N183 Chronic kidney disease, stage 3 unspecified: Secondary | ICD-10-CM | POA: Diagnosis not present

## 2020-07-08 DIAGNOSIS — I5032 Chronic diastolic (congestive) heart failure: Secondary | ICD-10-CM | POA: Diagnosis not present

## 2020-07-08 DIAGNOSIS — D329 Benign neoplasm of meninges, unspecified: Secondary | ICD-10-CM | POA: Diagnosis not present

## 2020-07-08 DIAGNOSIS — F0150 Vascular dementia without behavioral disturbance: Secondary | ICD-10-CM | POA: Diagnosis not present

## 2020-07-08 DIAGNOSIS — N400 Enlarged prostate without lower urinary tract symptoms: Secondary | ICD-10-CM | POA: Diagnosis not present

## 2020-07-08 DIAGNOSIS — L89622 Pressure ulcer of left heel, stage 2: Secondary | ICD-10-CM | POA: Diagnosis not present

## 2020-07-12 DIAGNOSIS — N400 Enlarged prostate without lower urinary tract symptoms: Secondary | ICD-10-CM | POA: Diagnosis not present

## 2020-07-12 DIAGNOSIS — I5032 Chronic diastolic (congestive) heart failure: Secondary | ICD-10-CM | POA: Diagnosis not present

## 2020-07-12 DIAGNOSIS — I130 Hypertensive heart and chronic kidney disease with heart failure and stage 1 through stage 4 chronic kidney disease, or unspecified chronic kidney disease: Secondary | ICD-10-CM | POA: Diagnosis not present

## 2020-07-12 DIAGNOSIS — N183 Chronic kidney disease, stage 3 unspecified: Secondary | ICD-10-CM | POA: Diagnosis not present

## 2020-07-12 DIAGNOSIS — G40209 Localization-related (focal) (partial) symptomatic epilepsy and epileptic syndromes with complex partial seizures, not intractable, without status epilepticus: Secondary | ICD-10-CM | POA: Diagnosis not present

## 2020-07-12 DIAGNOSIS — I69393 Ataxia following cerebral infarction: Secondary | ICD-10-CM | POA: Diagnosis not present

## 2020-07-12 DIAGNOSIS — F0150 Vascular dementia without behavioral disturbance: Secondary | ICD-10-CM | POA: Diagnosis not present

## 2020-07-12 DIAGNOSIS — L89622 Pressure ulcer of left heel, stage 2: Secondary | ICD-10-CM | POA: Diagnosis not present

## 2020-07-12 DIAGNOSIS — D329 Benign neoplasm of meninges, unspecified: Secondary | ICD-10-CM | POA: Diagnosis not present

## 2020-07-14 ENCOUNTER — Other Ambulatory Visit: Payer: Self-pay | Admitting: *Deleted

## 2020-07-14 DIAGNOSIS — I5032 Chronic diastolic (congestive) heart failure: Secondary | ICD-10-CM

## 2020-07-14 MED ORDER — FUROSEMIDE 40 MG PO TABS: 80.0000 mg | ORAL_TABLET | Freq: Every day | ORAL | 1 refills | Status: DC | PRN

## 2020-07-15 ENCOUNTER — Other Ambulatory Visit: Payer: Self-pay | Admitting: *Deleted

## 2020-07-15 DIAGNOSIS — I130 Hypertensive heart and chronic kidney disease with heart failure and stage 1 through stage 4 chronic kidney disease, or unspecified chronic kidney disease: Secondary | ICD-10-CM | POA: Diagnosis not present

## 2020-07-15 DIAGNOSIS — I10 Essential (primary) hypertension: Secondary | ICD-10-CM

## 2020-07-15 DIAGNOSIS — G40209 Localization-related (focal) (partial) symptomatic epilepsy and epileptic syndromes with complex partial seizures, not intractable, without status epilepticus: Secondary | ICD-10-CM | POA: Diagnosis not present

## 2020-07-15 DIAGNOSIS — I69393 Ataxia following cerebral infarction: Secondary | ICD-10-CM | POA: Diagnosis not present

## 2020-07-15 DIAGNOSIS — I5032 Chronic diastolic (congestive) heart failure: Secondary | ICD-10-CM | POA: Diagnosis not present

## 2020-07-15 DIAGNOSIS — F0150 Vascular dementia without behavioral disturbance: Secondary | ICD-10-CM | POA: Diagnosis not present

## 2020-07-15 DIAGNOSIS — N400 Enlarged prostate without lower urinary tract symptoms: Secondary | ICD-10-CM | POA: Diagnosis not present

## 2020-07-15 DIAGNOSIS — L89622 Pressure ulcer of left heel, stage 2: Secondary | ICD-10-CM | POA: Diagnosis not present

## 2020-07-15 DIAGNOSIS — D329 Benign neoplasm of meninges, unspecified: Secondary | ICD-10-CM | POA: Diagnosis not present

## 2020-07-15 DIAGNOSIS — N183 Chronic kidney disease, stage 3 unspecified: Secondary | ICD-10-CM | POA: Diagnosis not present

## 2020-07-15 MED ORDER — AMLODIPINE BESYLATE 5 MG PO TABS
5.0000 mg | ORAL_TABLET | Freq: Every day | ORAL | 3 refills | Status: DC
Start: 2020-07-15 — End: 2020-07-23

## 2020-07-18 ENCOUNTER — Other Ambulatory Visit: Payer: Self-pay

## 2020-07-18 ENCOUNTER — Inpatient Hospital Stay (HOSPITAL_COMMUNITY)
Admission: EM | Admit: 2020-07-18 | Discharge: 2020-07-24 | DRG: 378 | Disposition: A | Discharge status: Skilled Nursing Facility | Payer: Medicare HMO | Attending: Internal Medicine | Admitting: Internal Medicine

## 2020-07-18 ENCOUNTER — Inpatient Hospital Stay (HOSPITAL_COMMUNITY): Payer: Medicare HMO

## 2020-07-18 ENCOUNTER — Encounter (HOSPITAL_COMMUNITY): Payer: Self-pay | Admitting: Internal Medicine

## 2020-07-18 DIAGNOSIS — Z95828 Presence of other vascular implants and grafts: Secondary | ICD-10-CM

## 2020-07-18 DIAGNOSIS — I5032 Chronic diastolic (congestive) heart failure: Secondary | ICD-10-CM | POA: Diagnosis not present

## 2020-07-18 DIAGNOSIS — N400 Enlarged prostate without lower urinary tract symptoms: Secondary | ICD-10-CM | POA: Diagnosis present

## 2020-07-18 DIAGNOSIS — Z7982 Long term (current) use of aspirin: Secondary | ICD-10-CM | POA: Diagnosis not present

## 2020-07-18 DIAGNOSIS — H9193 Unspecified hearing loss, bilateral: Secondary | ICD-10-CM | POA: Diagnosis present

## 2020-07-18 DIAGNOSIS — N1831 Chronic kidney disease, stage 3a: Secondary | ICD-10-CM | POA: Diagnosis not present

## 2020-07-18 DIAGNOSIS — Z86718 Personal history of other venous thrombosis and embolism: Secondary | ICD-10-CM

## 2020-07-18 DIAGNOSIS — R262 Difficulty in walking, not elsewhere classified: Secondary | ICD-10-CM | POA: Diagnosis not present

## 2020-07-18 DIAGNOSIS — M2550 Pain in unspecified joint: Secondary | ICD-10-CM | POA: Diagnosis not present

## 2020-07-18 DIAGNOSIS — K921 Melena: Secondary | ICD-10-CM | POA: Insufficient documentation

## 2020-07-18 DIAGNOSIS — K922 Gastrointestinal hemorrhage, unspecified: Secondary | ICD-10-CM | POA: Diagnosis present

## 2020-07-18 DIAGNOSIS — I10 Essential (primary) hypertension: Secondary | ICD-10-CM | POA: Diagnosis not present

## 2020-07-18 DIAGNOSIS — R2681 Unsteadiness on feet: Secondary | ICD-10-CM | POA: Diagnosis not present

## 2020-07-18 DIAGNOSIS — E785 Hyperlipidemia, unspecified: Secondary | ICD-10-CM | POA: Diagnosis present

## 2020-07-18 DIAGNOSIS — M6281 Muscle weakness (generalized): Secondary | ICD-10-CM | POA: Diagnosis not present

## 2020-07-18 DIAGNOSIS — R54 Age-related physical debility: Secondary | ICD-10-CM | POA: Diagnosis present

## 2020-07-18 DIAGNOSIS — D62 Acute posthemorrhagic anemia: Secondary | ICD-10-CM | POA: Diagnosis not present

## 2020-07-18 DIAGNOSIS — R0902 Hypoxemia: Secondary | ICD-10-CM | POA: Diagnosis not present

## 2020-07-18 DIAGNOSIS — Z8673 Personal history of transient ischemic attack (TIA), and cerebral infarction without residual deficits: Secondary | ICD-10-CM

## 2020-07-18 DIAGNOSIS — Z7401 Bed confinement status: Secondary | ICD-10-CM | POA: Diagnosis not present

## 2020-07-18 DIAGNOSIS — K5730 Diverticulosis of large intestine without perforation or abscess without bleeding: Secondary | ICD-10-CM | POA: Diagnosis present

## 2020-07-18 DIAGNOSIS — Z20822 Contact with and (suspected) exposure to covid-19: Secondary | ICD-10-CM | POA: Diagnosis present

## 2020-07-18 DIAGNOSIS — R531 Weakness: Secondary | ICD-10-CM | POA: Diagnosis not present

## 2020-07-18 DIAGNOSIS — I130 Hypertensive heart and chronic kidney disease with heart failure and stage 1 through stage 4 chronic kidney disease, or unspecified chronic kidney disease: Secondary | ICD-10-CM | POA: Diagnosis not present

## 2020-07-18 DIAGNOSIS — Z9079 Acquired absence of other genital organ(s): Secondary | ICD-10-CM

## 2020-07-18 DIAGNOSIS — R58 Hemorrhage, not elsewhere classified: Secondary | ICD-10-CM | POA: Diagnosis not present

## 2020-07-18 DIAGNOSIS — Z79899 Other long term (current) drug therapy: Secondary | ICD-10-CM | POA: Diagnosis not present

## 2020-07-18 DIAGNOSIS — K8020 Calculus of gallbladder without cholecystitis without obstruction: Secondary | ICD-10-CM | POA: Diagnosis not present

## 2020-07-18 DIAGNOSIS — Z86011 Personal history of benign neoplasm of the brain: Secondary | ICD-10-CM

## 2020-07-18 DIAGNOSIS — N281 Cyst of kidney, acquired: Secondary | ICD-10-CM | POA: Diagnosis not present

## 2020-07-18 DIAGNOSIS — K5731 Diverticulosis of large intestine without perforation or abscess with bleeding: Secondary | ICD-10-CM | POA: Diagnosis not present

## 2020-07-18 DIAGNOSIS — F0150 Vascular dementia without behavioral disturbance: Secondary | ICD-10-CM | POA: Diagnosis present

## 2020-07-18 DIAGNOSIS — R2689 Other abnormalities of gait and mobility: Secondary | ICD-10-CM | POA: Diagnosis not present

## 2020-07-18 DIAGNOSIS — R41841 Cognitive communication deficit: Secondary | ICD-10-CM | POA: Diagnosis not present

## 2020-07-18 DIAGNOSIS — R1312 Dysphagia, oropharyngeal phase: Secondary | ICD-10-CM | POA: Diagnosis not present

## 2020-07-18 LAB — CBC WITH DIFFERENTIAL/PLATELET
Abs Immature Granulocytes: 0.02 10*3/uL (ref 0.00–0.07)
Basophils Absolute: 0 10*3/uL (ref 0.0–0.1)
Basophils Relative: 0 %
Eosinophils Absolute: 0.3 10*3/uL (ref 0.0–0.5)
Eosinophils Relative: 4 %
HCT: 35.3 % — ABNORMAL LOW (ref 39.0–52.0)
Hemoglobin: 10.9 g/dL — ABNORMAL LOW (ref 13.0–17.0)
Immature Granulocytes: 0 %
Lymphocytes Relative: 24 %
Lymphs Abs: 1.7 10*3/uL (ref 0.7–4.0)
MCH: 30 pg (ref 26.0–34.0)
MCHC: 30.9 g/dL (ref 30.0–36.0)
MCV: 97.2 fL (ref 80.0–100.0)
Monocytes Absolute: 0.6 10*3/uL (ref 0.1–1.0)
Monocytes Relative: 8 %
Neutro Abs: 4.7 10*3/uL (ref 1.7–7.7)
Neutrophils Relative %: 64 %
Platelets: 153 10*3/uL (ref 150–400)
RBC: 3.63 MIL/uL — ABNORMAL LOW (ref 4.22–5.81)
RDW: 13.7 % (ref 11.5–15.5)
WBC: 7.4 10*3/uL (ref 4.0–10.5)
nRBC: 0 % (ref 0.0–0.2)

## 2020-07-18 LAB — COMPREHENSIVE METABOLIC PANEL
ALT: 11 U/L (ref 0–44)
AST: 12 U/L — ABNORMAL LOW (ref 15–41)
Albumin: 2.7 g/dL — ABNORMAL LOW (ref 3.5–5.0)
Alkaline Phosphatase: 39 U/L (ref 38–126)
Anion gap: 4 — ABNORMAL LOW (ref 5–15)
BUN: 27 mg/dL — ABNORMAL HIGH (ref 8–23)
CO2: 28 mmol/L (ref 22–32)
Calcium: 9.6 mg/dL (ref 8.9–10.3)
Chloride: 108 mmol/L (ref 98–111)
Creatinine, Ser: 1.34 mg/dL — ABNORMAL HIGH (ref 0.61–1.24)
GFR, Estimated: 49 mL/min — ABNORMAL LOW (ref >60)
Glucose, Bld: 111 mg/dL — ABNORMAL HIGH (ref 70–99)
Potassium: 4.5 mmol/L (ref 3.5–5.1)
Sodium: 140 mmol/L (ref 135–145)
Total Bilirubin: 0.6 mg/dL (ref 0.3–1.2)
Total Protein: 5.6 g/dL — ABNORMAL LOW (ref 6.5–8.1)

## 2020-07-18 LAB — PROTIME-INR
INR: 1.1 (ref 0.8–1.2)
Prothrombin Time: 14.3 seconds (ref 11.4–15.2)

## 2020-07-18 LAB — RESP PANEL BY RT-PCR (FLU A&B, COVID) ARPGX2
Influenza A by PCR: NEGATIVE
Influenza B by PCR: NEGATIVE
SARS Coronavirus 2 by RT PCR: NEGATIVE

## 2020-07-18 LAB — PREPARE RBC (CROSSMATCH)

## 2020-07-18 LAB — APTT: aPTT: 30 seconds (ref 24–36)

## 2020-07-18 MED ORDER — SODIUM CHLORIDE 0.9 % IV BOLUS
1000.0000 mL | Freq: Once | INTRAVENOUS | Status: AC
  Administered 2020-07-18: 1000 mL via INTRAVENOUS

## 2020-07-18 MED ORDER — ACETAMINOPHEN 325 MG PO TABS: 650.0000 mg | ORAL_TABLET | Freq: Four times a day (QID) | ORAL | Status: DC | PRN

## 2020-07-18 MED ORDER — SODIUM CHLORIDE 0.9 % IV SOLN
10.0000 mL/h | Freq: Once | INTRAVENOUS | Status: AC
  Administered 2020-07-18: 10 mL/h via INTRAVENOUS

## 2020-07-18 MED ORDER — IOHEXOL 350 MG/ML SOLN
100.0000 mL | Freq: Once | INTRAVENOUS | Status: AC | PRN
  Administered 2020-07-18: 100 mL via INTRAVENOUS

## 2020-07-18 MED ORDER — ACETAMINOPHEN 650 MG RE SUPP: 650.0000 mg | Freq: Four times a day (QID) | RECTAL | Status: DC | PRN

## 2020-07-18 MED ORDER — SODIUM CHLORIDE 0.9 % IV SOLN: INTRAVENOUS | Status: DC

## 2020-07-18 NOTE — ED Notes (Signed)
Patient transported to CT 

## 2020-07-18 NOTE — ED Notes (Signed)
Update given to pts daughter [name]

## 2020-07-18 NOTE — ED Provider Notes (Signed)
[facility] EMERGENCY DEPARTMENT Provider Note   CSN: [id number] Arrival date & time: 07/18/20  1242     History No chief complaint on file.   [name] is a 87 y.o. male with past medical history of HTN, CVA, and DVT not on anticoagulation who presents the ED via EMS for large-volume BRBPR.  History was personally obtained by EMS reports that vital signs were stable in route, but that the patient is lying in large volume BRBPR.  I spoke with the patient's daughter on the phone, [name], who states that last evening he would spray fecal material when passing flatulence, but there was no BRBPR.  This morning she walked by his room and noticed an "iron smell" and on investigation found to be lying in a large pool of bright red blood.  She immediately called EMS.  Daughter confirms to me that he is DNR and that he is not on any anticoagulation.  I reviewed patient's medical record and he was admitted to the hospital 08/2018 for lower GI bleed.  During that hospitalization, he was evaluated by Dr. Paulita Fujita, gastroenterology.  His bleeding had improved prior to colonoscopy so it was ultimately suspected that he had had a diverticular bleed in the context of Xarelto, which he had been taking at that time for DVT.  [facility] planned to follow.    Patient is endorsing mild lower abdominal cramping discomfort, but no severe pain symptoms.  He is particularly hard of hearing.  He adamantly denies any chest pain or shortness of breath.   HPI     Past Medical History:  Diagnosis Date  . Anemia   . BPH (benign prostatic hypertrophy)    TURP 05/19/13  . Cerebral embolism with cerebral infarction ([place]) 12/19/2013  . Cerebrovascular accident (CVA) ([place]) 04/17/2019  . Chronic kidney disease    CHRONIC KIDNEY DISEASE, 2  . Difficulty hearing, right    BILATERAL HEARING LOSS - BEST TO TRY TO SPEAK INTO LEFT EAR  . DVT (deep venous thrombosis) ([place])   . Frequent falls   . History of  DVT (deep vein thrombosis) 06/10/2013   Provoked s/p TURP. Dx per doppler 06/10/13. Complicated by hematuria while on anticoagulation s/p TURP. Initially on lovenox and coumadin, then stopped, then IVC filter placed 06/16/13. Anticoagulation restarted after hematuria stopped. Lovenox was discontinued apparently on 07/29/13 with comment on high risk for falls.   Total ~1.5 months of anticoagulation but interrupted with bleeding complication.  . Hyperlipidemia   . Hypertension   . Incontinence of urine    SOME INCONTINENCE  . Left adrenal mass ([place]) 09/11/2013  . Meningioma ([place])   . Stroke ([place])    Cerebellar, 2013; WALKS WITH WALKER, ABLE TO DRESS AND BATHE HIMSELF BUT FAMILY TRIES TO PROVIDE SUPERVISION BECAUSE OF HIS HX OF FALL AND WEAKNESS LEGS, ARMS   . Thrombocytopenia ([place])   . Vitreous hemorrhage ([place]) 12/21/2013   OS 12/18/13 CT /MRI brain done for ataxia     Patient Active Problem List   Diagnosis Date Noted  . Thrombocytopenia ([place]) 03/30/2020  . Weakness 03/19/2020  . COVID-19 03/19/2020  . Partial symptomatic epilepsy with complex partial seizures, not intractable, without status epilepticus ([place]) 04/17/2019  . Chronic diastolic (congestive) heart failure ([place]) 10/28/2017  . 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 Vascular dementia ([place]) 01/28/2016  . Presence of IVC filter 07/17/2014  . CKD (chronic kidney disease) stage 3, GFR 30-59 ml/min ([facility]) 06/23/2013  .  Hyperlipidemia   . BPH (benign prostatic hyperplasia) 04/24/2013  . Hearing loss 07/20/2011  . Meningioma ([facility]) 07/20/2011  . HTN (hypertension) 07/19/2011    Past Surgical History:  Procedure Laterality Date  . CYSTOSCOPY N/A 06/13/2013   Procedure: CYSTOSCOPY FLEXIBLE BEDSIDE;  Surgeon: Crist FatBenjamin W Herrick, MD;  [facility];  Service: Urology;  Laterality: N/A;  . MYRINGOTOMY WITH TUBE PLACEMENT Bilateral   . TRANSURETHRAL RESECTION OF PROSTATE N/A 05/19/2013   Procedure: TRANSURETHRAL  RESECTION OF THE PROSTATE WITH GYRUS INSTRUMENTS;  Surgeon: Kathi LudwigSigmund I Tannenbaum, MD;  Location: [facility] ORS;  Service: Urology;  Laterality: N/A;       Family History  Problem Relation Age of Onset  . Diabetes Mother   . Heart disease Mother   . Hypertension Mother   . Heart disease Father   . Hypertension Father   . Hypertension Daughter   . Thyroid disease Daughter     Social History   Tobacco Use  . Smoking status: Never Smoker  . Smokeless tobacco: Never Used  Vaping Use  . Vaping Use: Never used  Substance Use Topics  . Alcohol use: No    Alcohol/week: 0.0 standard drinks  . Drug use: No    Home Medications Prior to Admission medications   Medication Sig Start Date End Date Taking? Authorizing Provider  acetaminophen (TYLENOL) 500 MG tablet Take 1 tablet (500 mg total) by mouth every 6 (six) hours as needed for fever. 05/25/20   Gust RungHoffman, Erik C, DO  amLODipine (NORVASC) 5 MG tablet Take 1 tablet (5 mg total) by mouth daily. 07/15/20   Gust RungHoffman, Erik C, DO  aspirin 81 MG EC tablet Take 1 tablet (81 mg total) by mouth daily. 05/25/20   Gust RungHoffman, Erik C, DO  atorvastatin (LIPITOR) 20 MG tablet Take 1 tablet (20 mg total) by mouth daily. 05/25/20   Gust RungHoffman, Erik C, DO  furosemide (LASIX) 40 MG tablet Take 2 tablets (80 mg total) by mouth daily as needed for fluid. 07/14/20   Gust RungHoffman, Erik C, DO  levETIRAcetam (KEPPRA XR) 500 MG 24 hr tablet Take 2 tablets (1,000 mg total) by mouth at bedtime. 05/25/20   Gust RungHoffman, Erik C, DO  triamcinolone ointment (KENALOG) 0.1 % Apply 1 application topically 2 (two) times daily as needed (For leg rash). 04/12/20   Medina-Vargas, Monina C, NP    Allergies    Patient has no known allergies.  Review of Systems   Review of Systems  All other systems reviewed and are negative.   Physical Exam Updated Vital Signs BP 138/90   Pulse 73   Temp 97.9 F (36.6 C) (Oral)   Resp (!) 26   Ht 5\' 7"  (1.702 m)   Wt 90.7 kg   SpO2 95%   BMI 31.32 kg/m    Physical Exam Vitals and nursing note reviewed. Exam conducted with a chaperone present.  Constitutional:      General: He is in acute distress.  HENT:     Head: Normocephalic and atraumatic.  Eyes:     General: No scleral icterus.    Conjunctiva/sclera: Conjunctivae normal.     Comments: Mildly pale conjunctiva bilaterally.  Cardiovascular:     Rate and Rhythm: Normal rate.     Pulses: Normal pulses.  Pulmonary:     Effort: Pulmonary effort is normal. No respiratory distress.  Abdominal:     General: Abdomen is flat. There is no distension.     Palpations: Abdomen is soft.     Tenderness:  There is no abdominal tenderness. There is no guarding.  Genitourinary:    Rectum: Guaiac result positive.     Comments: Patient was cleaned and area was inspected.  No visible lacerations or wounds in perineum.  Suspect rectal source.   Skin:    General: Skin is dry.     Coloration: Skin is pale.  Neurological:     Mental Status: He is alert.     GCS: GCS eye subscore is 4. GCS verbal subscore is 5. GCS motor subscore is 6.  Psychiatric:        Mood and Affect: Mood normal.        Behavior: Behavior normal.        Thought Content: Thought content normal.     ED Results / Procedures / Treatments   Labs (all labs ordered are listed, but only abnormal results are displayed) Labs Reviewed  COMPREHENSIVE METABOLIC PANEL - Abnormal; Notable for the following components:      Result Value   Glucose, Bld 111 (*)    BUN 27 (*)    Creatinine, Ser 1.34 (*)    Total Protein 5.6 (*)    Albumin 2.7 (*)    AST 12 (*)    GFR, Estimated 49 (*)    Anion gap 4 (*)    All other components within normal limits  CBC WITH DIFFERENTIAL/PLATELET - Abnormal; Notable for the following components:   RBC 3.63 (*)    Hemoglobin 10.9 (*)    HCT 35.3 (*)    All other components within normal limits  RESP PANEL BY RT-PCR (FLU A&B, COVID) ARPGX2  PROTIME-INR  APTT  POC OCCULT BLOOD, ED  TYPE AND  SCREEN  PREPARE RBC (CROSSMATCH)    EKG None  Radiology No results found.  Procedures .Critical Care Performed by: Corena Herter, PA-C Authorized by: Corena Herter, PA-C   Critical care provider statement:    Critical care time (minutes):  60   Critical care was necessary to treat or prevent imminent or life-threatening deterioration of the following conditions:  Circulatory failure   Critical care was time spent personally by me on the following activities:  Discussions with consultants, evaluation of patient's response to treatment, examination of patient, ordering and performing treatments and interventions, ordering and review of laboratory studies, ordering and review of radiographic studies, pulse oximetry, re-evaluation of patient's condition, obtaining history from patient or surrogate and review of old charts Comments:     Lower GI hemorrhage requiring transfusion.     Medications Ordered in ED Medications  sodium chloride 0.9 % bolus 1,000 mL (has no administration in time range)    And  0.9 %  sodium chloride infusion (has no administration in time range)  0.9 %  sodium chloride infusion (has no administration in time range)    ED Course  I have reviewed the triage vital signs and the nursing notes.  Pertinent labs & imaging results that were available during my care of the patient were reviewed by me and considered in my medical decision making (see chart for details).  Clinical Course as of 07/18/20 1523  Sun Jul 18, 2020  1355 I spoke with Alonza Bogus, PA-C from [place] who suspects that patient is unassigned given that he did not follow-up with [facility] on outpatient basis.  She recommends getting a CT angio of abdomen with contrast and then, if indicated, consult IR.  In the interim, she will see patient. [GG]  1510 Diverticular  bleed onset today No blood thinner 2 BM so far this morning, large GI will see patient requesting CTA for possible IR  intervention Receiving emergent 2 units and IVF Consult IM for admission  [CG]    Clinical Course User Index [CG] Kinnie Feil, PA-C [GG] Reita Chard   MDM Rules/Calculators/A&P                          ESAIAH WANLESS was evaluated in Emergency Department on 07/18/2020 for the symptoms described in the history of present illness. He was evaluated in the context of the global COVID-19 pandemic, which necessitated consideration that the patient might be at risk for infection with the SARS-CoV-2 virus that causes COVID-19. Institutional protocols and algorithms that pertain to the evaluation of patients at risk for COVID-19 are in a state of rapid change based on information released by regulatory bodies including the CDC and federal and state organizations. These policies and algorithms were followed during the patient's care in the ED.  I personally reviewed patient's medical chart and all notes from triage and staff during today's encounter. I have also ordered and reviewed all labs and imaging that I felt to be medically necessary in the evaluation of this patient's complaints and with consideration of their physical exam. If needed, translation services were available and utilized.   Patient in the ED for large volume lower GI bleed, not on anticoagulation.  He has a history of diverticular bleed in the past.  Given the particularly large pooling of BRBPR, will proceed with emergent blood release.  Discussed blood transfusion products with patient and daughter, both give their permission to proceed.  He does have a history of thrombocytopenia, but only in the low 100s.  We will start 2 large-bore IVs and hang 1 L IV NS as well.  Fortunately he is currently hemodynamically stable, but given the severe pooling of bright red blood, low threshold to initiate blood replacement.  We will consult [facility].  He will need to have his hemoglobins trended and be admitted to a progressive care  bed.  I spoke with Alonza Bogus, PA-C from [place] who suspects that patient is unassigned given that he did not follow-up with [facility] on outpatient basis.  She recommends getting a CT angio of abdomen with contrast and then, if indicated, consult IR.  In the interim, she will see patient.  Placed order for CT angio abdomen and pelvis to evaluate for large diverticular bleeding.  Based on those findings, IR will need to be consulted.  Laboratory work-up notable for only mild renal impairment with creatinine 1.34 and GFR reduced to 49.  Mild worsening when compared to prior labs.  BUN with slight elevation at 27, but hemorrhage appears to be lower GI rather than upper.  Hemoglobin mildly reduced at 10.9 when compared to baseline of 13.  Suspect that repeat hemoglobin would have showed significant worsening if not for intervention with transfusions.  Will still need to be trended.  I spoke with Dr. Lovena Neighbours, internal medicine, who will admit patient.  Carmon Sails, PA-C is taking over for me and will consult IR if needed.    Final Clinical Impression(s) / ED Diagnoses Final diagnoses:  Lower GI bleed    Rx / DC Orders ED Discharge Orders    None       Corena Herter, PA-C 07/18/20 1523    Pattricia Boss, MD 07/23/20 1642

## 2020-07-18 NOTE — ED Triage Notes (Signed)
Pt arrives via EMS from home with complaints of bloody stool since this am. Pt satuated with bright red blood, clots present. Pt alert and oriented X3, person, place and time. Reports by EMS, this is baseline.  137/90 HR 80 SpO2 94%

## 2020-07-18 NOTE — H&P (Addendum)
Date: 07/18/2020               Patient Name:  [name] MRN: [id number]  DOB: 11-Dec-1924 Age / Sex: 87 y.o., male   PCP: Jimmy Groves, DO              Medical Service: Internal Medicine Teaching Service              Attending Physician: Dr. Pattricia Boss, MD    First Contact: [name], MS4 Pager: [phone]  Second Contact: Dr. Maudie Arroyo Pager: [phone]       After Hours (After 5p/  First Contact Pager: [phone]  weekends / holidays): Second Contact Pager: [phone]   Chief Complaint: Large volume bright red blood per rectum   History of Present Illness: [name] is a 87 y.o. male with past medical history of HTN, CHF, CKD stage 3, CVA, vascular dementia, DVT not on anticoagulation, and diverticular GI bleed who presents with large bright red blood per rectum. Patient's daughter was at bedside and provided history. She reports patient has been in his usual state of health and has not noticed any significant changes in his behavior. Earlier in the day, the patient was sitting on the toilet, passing gas while also having diarrhea but she did not notice any blood or signs of bleeding at this time. States patient took a nap after this and later in the afternoon when she walked by his room, she could smell stool with an iron smell to it. She walked in and saw the patient lying in a large pool of bright red blood. The blood was covering his bottom, sides, and back. Reports the patient was alert and did not loss consciousness. He did not report any chest pain or abdominal pain or dizziness. He kept lying down and was conversing with his daughter. In the ED, the patient also denied any pain.   His daughter states he had a GI bleed back in 08/2018 also with bright red blood per rectum, but the volume of bleeding upon presentation was much less than today. He did not receive any intervention at that time, as the bleeding resolved on its own. Bleeding was thought to be due to  diverticular bleed and was seen by [facility] at that time. Was on anticoagulation (Xarelto) at that time for history of DVT provoked by TURP in 2015.   Currently, he is not on anticoagulation. His current medications are ASA 81 mg daily, atorvastatin 20 mg daily, amlodipine 5 mg daily, and lasix 40 mg BID. Of note, he was on Keppra XR 1000 mg daily previously after being hospitalized for stroke symptoms and found to have possible seizure activity on EEG. Underwent work up with neurology and found no evidence of seizures and Keppra was discontinued. No sx of seizure since being off Keppra. Does not use advil, ibuprofen, naproxen, aleve, etc.   Patient lives with his daughter, [name], [name] She is his caretaker and power of attorney. Mother and father had heart disease/CHF, but no known history of cancers. He does not use tobacco, alcohol, or any other drugs. Patient states he loves eating garlic.   ED Course: Brought to ED by EMS. Upon arrival patient alert and oriented x 3. Vitals stable with BP at 137/90, HR 80, SpO2 94% on RA, and afebrile. Due to large volume bright red blood per rectum, started 2 large-bore IVs and got 1 L normal saline. Received 1 unit pRBCs and [facility] consulted for lower GI bleed.  Hemoglobin reduced at 10.9 and baseline is around 13. Laboratory work-up notable for creatinine 1.34, BUN 27, and GFR reduced to 49. Based on GI recs, order for CT angiogram abdomen and pelvis to evaluate for large diverticular bleed. May need IR consultation based on results.   Meds:  Current Meds  Medication Sig  . acetaminophen (TYLENOL) 500 MG tablet Take 1 tablet (500 mg total) by mouth every 6 (six) hours as needed for fever.  Marland Kitchen amLODipine (NORVASC) 5 MG tablet Take 1 tablet (5 mg total) by mouth daily.  Marland Kitchen aspirin 81 MG EC tablet Take 1 tablet (81 mg total) by mouth daily.  Marland Kitchen atorvastatin (LIPITOR) 20 MG tablet Take 1 tablet (20 mg total) by mouth daily.  . furosemide (LASIX) 40 MG  tablet Take 2 tablets (80 mg total) by mouth daily as needed for fluid.  Marland Kitchen triamcinolone ointment (KENALOG) 0.1 % Apply 1 application topically 2 (two) times daily as needed (For leg rash).   Allergies: Allergies as of 07/18/2020  . (No Known Allergies)   Past Medical History:  Diagnosis Date  . Anemia   . BPH (benign prostatic hypertrophy)    TURP 05/19/13  . Cerebral embolism with cerebral infarction ([place]) 12/19/2013  . Cerebrovascular accident (CVA) ([place]) 04/17/2019  . Chronic kidney disease    CHRONIC KIDNEY DISEASE, 2  . Difficulty hearing, right    BILATERAL HEARING LOSS - BEST TO TRY TO SPEAK INTO LEFT EAR  . DVT (deep venous thrombosis) ([place])   . Frequent falls   . History of DVT (deep vein thrombosis) 06/10/2013   Provoked s/p TURP. Dx per doppler 06/10/13. Complicated by hematuria while on anticoagulation s/p TURP. Initially on lovenox and coumadin, then stopped, then IVC filter placed 06/16/13. Anticoagulation restarted after hematuria stopped. Lovenox was discontinued apparently on 07/29/13 with comment on high risk for falls.   Total ~1.5 months of anticoagulation but interrupted with bleeding complication.  . Hyperlipidemia   . Hypertension   . Incontinence of urine    SOME INCONTINENCE  . Left adrenal mass ([place]) 09/11/2013  . Meningioma ([place])   . Stroke ([place])    Cerebellar, 2013; WALKS WITH WALKER, ABLE TO DRESS AND BATHE HIMSELF BUT FAMILY TRIES TO PROVIDE SUPERVISION BECAUSE OF HIS HX OF FALL AND WEAKNESS LEGS, ARMS   . Thrombocytopenia ([place])   . Vitreous hemorrhage ([place]) 12/21/2013   OS 12/18/13 CT /MRI brain done for ataxia     Family History:  Family History  Problem Relation Age of Onset  . Diabetes Mother   . Heart disease Mother   . Hypertension Mother   . Heart disease Father   . Hypertension Father   . Hypertension Daughter   . Thyroid disease Daughter    Social History:  Social History   Socioeconomic History  . Marital status: Widowed    Spouse name:  Not on file  . Number of children: 1  . Years of education: 10th grade  . Highest education level: Not on file  Occupational History  . Occupation: Retired  Tobacco Use  . Smoking status: Never Smoker  . Smokeless tobacco: Never Used  Vaping Use  . Vaping Use: Never used  Substance and Sexual Activity  . Alcohol use: No    Alcohol/week: 0.0 standard drinks  . Drug use: No  . Sexual activity: Never  Other Topics Concern  . Not on file  Social History Narrative   Current Social History 08/04/2019  Patient lives with his daughter, [name] in a home which is 1 story. There are steps up to the entrance with handrails and a ramp which the patient uses.       Patient's method of transportation is via his daughter.      The highest level of education was junior high (8th grade).      The patient is currently retired.      Identified important Relationships as his daughter, [name].       Pets : No       Interests / Fun: He enjoys sitting outside on the porch.       Current Stressors: Can't do what he used to be able to do, per his daughter       Religious / Personal Beliefs: Catholic       [place], RN,BSN             Social Determinants of Health   Financial Resource Strain: Not on file  Food Insecurity: Not on file  Transportation Needs: Not on file  Physical Activity: Not on file  Stress: Not on file  Social Connections: Not on file  Intimate Partner Violence: Not on file   Review of Systems: A complete ROS was negative except as per HPI.  Physical Exam: Blood pressure (!) 137/94, pulse 72, temperature 98.1 F (36.7 C), temperature source Oral, resp. rate 18, height 5\' 7"  (1.702 m), weight 90.7 kg, SpO2 100 %.  Physical Exam Constitutional:      Appearance: Normal appearance.  HENT:     Head: Normocephalic and atraumatic.     Mouth/Throat:     Mouth: Mucous membranes are moist.  Eyes:     Conjunctiva/sclera: Conjunctivae normal.  Cardiovascular:      Rate and Rhythm: Normal rate and regular rhythm.     Pulses: Normal pulses.     Heart sounds: Normal heart sounds.  Pulmonary:     Effort: Pulmonary effort is normal.     Breath sounds: Normal breath sounds.  Abdominal:     General: Bowel sounds are normal. There is no distension.     Palpations: Abdomen is soft.     Tenderness: There is no abdominal tenderness. There is no guarding.  Musculoskeletal:     Comments: Trace lower extremity pitting edema in the ankles bilaterally. Left ankle is slightly more swollen than right ankle, which is chronic and unchanged from baseline.  Skin:    General: Skin is warm and dry.     Capillary Refill: Capillary refill takes less than 2 seconds.     Findings: Rash present.     Comments: Rash present on legs bilaterally.   Neurological:     Mental Status: He is alert. Mental status is at baseline.  Psychiatric:        Mood and Affect: Mood normal.    EKG: NSR with left axis deviation, unchanged from prior EKGs.   CBC    Component Value Date/Time   WBC 7.4 07/18/2020 1304   RBC 3.63 (L) 07/18/2020 1304   HGB 10.9 (L) 07/18/2020 1304   HGB 7.9 (L) 08/28/2018 1116   HCT 35.3 (L) 07/18/2020 1304   HCT 25.3 (L) 08/28/2018 1116   PLT 153 07/18/2020 1304   PLT 156 08/28/2018 1116   MCV 97.2 07/18/2020 1304   MCV 91 08/28/2018 1116   MCH 30.0 07/18/2020 1304   MCHC 30.9 07/18/2020 1304   RDW 13.7 07/18/2020 1304   RDW 13.2 08/28/2018 1116  LYMPHSABS 1.7 07/18/2020 1304   MONOABS 0.6 07/18/2020 1304   EOSABS 0.3 07/18/2020 1304   BASOSABS 0.0 07/18/2020 1304   CMP     Component Value Date/Time   NA 140 07/18/2020 1304   NA 144 09/16/2019 1520   K 4.5 07/18/2020 1304   CL 108 07/18/2020 1304   CO2 28 07/18/2020 1304   GLUCOSE 111 (H) 07/18/2020 1304   BUN 27 (H) 07/18/2020 1304   BUN 21 09/16/2019 1520   CREATININE 1.34 (H) 07/18/2020 1304   CREATININE 0.96 01/15/2014 1648   CALCIUM 9.6 07/18/2020 1304   PROT 5.6 (L)  07/18/2020 1304   ALBUMIN 2.7 (L) 07/18/2020 1304   AST 12 (L) 07/18/2020 1304   ALT 11 07/18/2020 1304   ALKPHOS 39 07/18/2020 1304   BILITOT 0.6 07/18/2020 1304   GFRNONAA 49 (L) 07/18/2020 1304   GFRNONAA 71 01/15/2014 1648   GFRAA 57 (L) 09/16/2019 1520   GFRAA 82 01/15/2014 1648    Assessment & Plan by Problem: Active Problems:   Acute lower GI bleeding  Mr. Duron Huckleby is a 87 y.o. male with past medical history of HTN, CHF, CKD stage 3a, CVA, vascular dementia, DVT not on anticoagulation, and diverticular GI bleed who presents with large bright red blood per rectum for further work up of lower GI bleed.   #Large volume bright red blood per rectum  #Lower GI bleed  Patient presents with large volume bright red blood per rectum likely secondary to lower GI bleed. Currently hemodynamically stable and not on anticoagulation. Does take ASA 81 mg daily. Has a history of a diverticular GI bleed on Xarelto in 08/2018 that resolved without intervention and was seen by [facility]. Hgb 10.9 (baseline 13), has received 1 unit pRBCs, and [facility] to follow this admission.   -CT angiogram of abdomen and pelvis w and w/o contrast to identify diverticular bleed. Consult IR based on results of scan -NPO diet  -Trend CBC   -Has received 1 unit pRBCs, plan to receive total of 2 units pRBCs -2 large bore IVs in place with NS infusion at 10 ml/hr and 125 ml/hr  -Stop ASA 81 mg   #Hypertension  Patient did not take BP meds today, remains normotensive at 130s/80-90s. Home regimen is amlodipine 5 mg daily.   -Resume amlodipine 5 mg after NPO discontinued   #CKD Stage 3a Baseline Cr around 1.1-1.13. On admission Cr 1.34. Will get CT angio today.   -Continue to monitor Cr   #Heart failure with preserved ejection fraction  EF 60-65% as of 09/2018 echo and taking PO Lasix 40 mg BID at home.   [facility] Lasix 40 mg BID given large volume stools    #History of cerebellar CVA  Home regimen  includes atorvastatin 20 mg daily and ASA 81 mg daily.   -Resume atorvastatin 20 mg daily after NPO discontinued   #History of DVT  Provoked after TURP and diagnosed per doppler 06/10/2013. Complicated by hematuria while on anticoagulation s/p TURP. Initially on lovenox and coumadin, then stopped. Then IVC filter placed 06/16/13. Anticoagulation restarted after hematuria stopped. Lovenox was discontinued on 07/29/2013 with comment on high risk for falls. IVC still in place in 2016, referred for removal. Xarelto also started in 2016 given history of CVA and DVT.   -Currently not on anticoagulation   Dispo: Admit patient to Inpatient with expected length of stay greater than 2 midnights.  Signed: Jacqlyn Arroyo, Medical Student 07/18/2020, 5:59 PM  Pager:  [phone] After 5pm on weekdays and 1pm on weekends: On Call pager: [phone]  Attestation for Student Documentation:  I personally was present and performed or re-performed the history, physical exam and medical decision-making activities of this service and have verified that the service and findings are accurately documented in the student's note.  Jimmy Mercury, MD 07/18/2020, 6:32 PM

## 2020-07-18 NOTE — Consult Note (Addendum)
Attending physician's note   I have taken an interval history, reviewed the chart and examined the patient. I agree with the Advanced Practitioner's note, impression, and recommendations as outlined.   87 year old male with medical history as outlined below presents with large-volume painless hematochezia.  Does not take any anticoagulation or antiplatelet therapy.  Was otherwise in his usual state of health prior to symptoms onset.  Admission H/H 10.9/39.3 (baseline Hgb ~13).  Had a similar episode in 08/2018, which was largely managed conservatively.  1) Hematochezia 2) Acute blood loss anemia - Stat CTA - Transfusing 1 unit PRBCs in the ER due to expected decline in H/H given volume of blood loss in the ER - Continue serial H/H checks - If CTA negative, monitor for continued bleeding.  If concern for continued active bleed, plan for bowel prep overnight and colonoscopy tomorrow - Discussed these plans with the patient along with his daughter at bedside and both agree  [name], DO, FACG [phone] office            Referring Provider:  EDP Primary Care Physician:  [name], DO Primary Gastroenterologist:  Althia Forts  Reason for Consultation:  GI bleed  HPI: [name] is a 87 y.o. male with past medical history of HTN, CVA, and DVT not on anticoagulation who presented to the ED at [facility] via EMS for large-volume BRBPR.  When he arrived he was sitting in a large amount of blood per the ED PA.  History was obtained from the patient's daughter, [name], who is at bedside and states that last evening he had a bowel movement and then would spray fecal material into the toilet bowl when passing flatulence, but there was no sign of bleeding at all.  This morning she walked by his room and noticed it smelled like stool but with an iron smell to it and on investigation found to be lying in a large pool of bright red blood.  She immediately called EMS.  The patient is  [facility] but denies any abdominal pain.  He had a similar episode in 08/2018 at which time he was seen by [facility] as an inpatient and followed for a couple of days but the bleeding stopped and no intervention was needed.  His daughter says that episode did not seem as severe as this one.  She says that in between then and now that he had not experienced any other bleeding issues.  Hgb is 10.9 grams compared to 13.1 grams three months ago.  They already have a unit of PRBCs transfusing.   Past Medical History:  Diagnosis Date  . Anemia   . BPH (benign prostatic hypertrophy)    TURP 05/19/13  . Cerebral embolism with cerebral infarction ([place]) 12/19/2013  . Cerebrovascular accident (CVA) ([place]) 04/17/2019  . Chronic kidney disease    CHRONIC KIDNEY DISEASE, 2  . Difficulty hearing, right    BILATERAL HEARING LOSS - BEST TO TRY TO SPEAK INTO LEFT EAR  . DVT (deep venous thrombosis) ([place])   . Frequent falls   . History of DVT (deep vein thrombosis) 06/10/2013   Provoked s/p TURP. Dx per doppler 06/10/13. Complicated by hematuria while on anticoagulation s/p TURP. Initially on lovenox and coumadin, then stopped, then IVC filter placed 06/16/13. Anticoagulation restarted after hematuria stopped. Lovenox was discontinued apparently on 07/29/13 with comment on high risk for falls.   Total ~1.5 months of anticoagulation but interrupted with bleeding complication.  . Hyperlipidemia   .  Hypertension   . Incontinence of urine    SOME INCONTINENCE  . Left adrenal mass ([place]) 09/11/2013  . Meningioma ([place])   . Stroke ([place])    Cerebellar, 2013; WALKS WITH WALKER, ABLE TO DRESS AND BATHE HIMSELF BUT FAMILY TRIES TO PROVIDE SUPERVISION BECAUSE OF HIS HX OF FALL AND WEAKNESS LEGS, ARMS   . Thrombocytopenia ([place])   . Vitreous hemorrhage ([place]) 12/21/2013   OS 12/18/13 CT /MRI brain done for ataxia     Past Surgical History:  Procedure Laterality Date  . CYSTOSCOPY N/A 06/13/2013   Procedure: CYSTOSCOPY FLEXIBLE BEDSIDE;   Surgeon: Ardis Hughs, MD;  Location: [facility];  Service: Urology;  Laterality: N/A;  . MYRINGOTOMY WITH TUBE PLACEMENT Bilateral   . TRANSURETHRAL RESECTION OF PROSTATE N/A 05/19/2013   Procedure: TRANSURETHRAL RESECTION OF THE PROSTATE WITH GYRUS INSTRUMENTS;  Surgeon: Ailene Rud, MD;  Location: [facility];  Service: Urology;  Laterality: N/A;    Prior to Admission medications   Medication Sig Start Date End Date Taking? Authorizing Provider  acetaminophen (TYLENOL) 500 MG tablet Take 1 tablet (500 mg total) by mouth every 6 (six) hours as needed for fever. 05/25/20   [name], DO  amLODipine (NORVASC) 5 MG tablet Take 1 tablet (5 mg total) by mouth daily. 07/15/20   [name], DO  aspirin 81 MG EC tablet Take 1 tablet (81 mg total) by mouth daily. 05/25/20   [name], DO  atorvastatin (LIPITOR) 20 MG tablet Take 1 tablet (20 mg total) by mouth daily. 05/25/20   [name], DO  furosemide (LASIX) 40 MG tablet Take 2 tablets (80 mg total) by mouth daily as needed for fluid. 07/14/20   [name], DO  levETIRAcetam (KEPPRA XR) 500 MG 24 hr tablet Take 2 tablets (1,000 mg total) by mouth at bedtime. 05/25/20   [name], DO  triamcinolone ointment (KENALOG) 0.1 % Apply 1 application topically 2 (two) times daily as needed (For leg rash). 04/12/20   Medina-Vargas, Monina C, NP    Current Facility-Administered Medications  Medication Dose Route Frequency Provider Last Rate Last Admin  . sodium chloride 0.9 % bolus 1,000 mL  1,000 mL Intravenous Once Corena Herter, PA-C       And  . 0.9 %  sodium chloride infusion   Intravenous Continuous Green, Garrett L, PA-C      . 0.9 %  sodium chloride infusion  10 mL/hr Intravenous Once Corena Herter, PA-C       Current Outpatient Medications  Medication Sig Dispense Refill  . acetaminophen (TYLENOL) 500 MG tablet Take 1 tablet (500 mg total) by mouth every 6 (six) hours as needed for fever. 30 tablet 11  .  amLODipine (NORVASC) 5 MG tablet Take 1 tablet (5 mg total) by mouth daily. 90 tablet 3  . aspirin 81 MG EC tablet Take 1 tablet (81 mg total) by mouth daily. 90 tablet 3  . atorvastatin (LIPITOR) 20 MG tablet Take 1 tablet (20 mg total) by mouth daily. 90 tablet 3  . furosemide (LASIX) 40 MG tablet Take 2 tablets (80 mg total) by mouth daily as needed for fluid. 180 tablet 1  . levETIRAcetam (KEPPRA XR) 500 MG 24 hr tablet Take 2 tablets (1,000 mg total) by mouth at bedtime. 180 tablet 1  . triamcinolone ointment (KENALOG) 0.1 % Apply 1 application topically 2 (two) times daily as needed (For leg rash). 80 g 0  Allergies as of 07/18/2020  . (No Known Allergies)    Family History  Problem Relation Age of Onset  . Diabetes Mother   . Heart disease Mother   . Hypertension Mother   . Heart disease Father   . Hypertension Father   . Hypertension Daughter   . Thyroid disease Daughter     Social History   Socioeconomic History  . Marital status: Widowed    Spouse name: Not on file  . Number of children: 1  . Years of education: 10th grade  . Highest education level: Not on file  Occupational History  . Occupation: Retired  Tobacco Use  . Smoking status: Never Smoker  . Smokeless tobacco: Never Used  Vaping Use  . Vaping Use: Never used  Substance and Sexual Activity  . Alcohol use: No    Alcohol/week: 0.0 standard drinks  . Drug use: No  . Sexual activity: Never  Other Topics Concern  . Not on file  Social History Narrative   Current Social History 08/04/2019        Patient lives with his daughter, [name] in a home which is 1 story. There are steps up to the entrance with handrails and a ramp which the patient uses.       Patient's method of transportation is via his daughter.      The highest level of education was junior high (8th grade).      The patient is currently retired.      Identified important Relationships as his daughter, [name].       Pets : No        Interests / Fun: He enjoys sitting outside on the porch.       Current Stressors: Can't do what he used to be able to do, per his daughter       Religious / Personal Beliefs: Catholic       Ewa Gentry, RN,BSN             Social Determinants of Health   Financial Resource Strain: Not on file  Food Insecurity: Not on file  Transportation Needs: Not on file  Physical Activity: Not on file  Stress: Not on file  Social Connections: Not on file  Intimate Partner Violence: Not on file   Review of Systems: ROS is O/W negative except as mentioned in HPI.  Physical Exam: Vital signs in last 24 hours: Temp:  [97.9 F (36.6 C)-98 F (36.7 C)] 97.9 F (36.6 C) (05/08 1344) Pulse Rate:  [72-80] 72 (05/08 1344) Resp:  [16-28] 18 (05/08 1344) BP: (123-135)/(82-91) 135/91 (05/08 1344) SpO2:  [93 %-97 %] 94 % (05/08 1344) Weight:  [90.7 kg] 90.7 kg (05/08 1246)   General:  Alert, elderly, pleasant and cooperative in NAD Head:  Normocephalic and atraumatic. Eyes:  Sclera clear, no icterus.  Conjunctiva pink. Ears:  Normal auditory acuity. Mouth:  No deformity or lesions.   Lungs:  Clear throughout to auscultation.  No wheezes, crackles, or rhonchi.  Heart:  Regular rate and rhythm; no murmurs, clicks, rubs, or gallops. Abdomen:  Soft, non-distended.  BS present.  Non-tender.  Msk:  Symmetrical without gross deformities. Pulses:  Normal pulses noted. Extremities:  Without clubbing or edema. Neurologic:  Alert; grossly normal neurologically. Skin:  Intact without significant lesions or rashes..  Lab Results: Recent Labs    07/18/20 1304  WBC 7.4  HGB 10.9*  HCT 35.3*  PLT 153   BMET Recent Labs    07/18/20 1304  NA 140  K 4.5  CL 108  CO2 28  GLUCOSE 111*  BUN 27*  CREATININE 1.34*  CALCIUM 9.6   LFT Recent Labs    07/18/20 1304  PROT 5.6*  ALBUMIN 2.7*  AST 12*  ALT 11  ALKPHOS 39  BILITOT 0.6   PT/INR Recent Labs    07/18/20 1304  LABPROT 14.3   INR 1.1   IMPRESSION:  *87 year old male with sudden onset large volume hematochezia.  Had similar presentation in 08/2018 and was thought to be diverticular, but resolved quickly and was not as severe. *ABLA:  Secondary to GI losses.  Hgb 10.9 grams as compared to 13.1 grams three months ago.  PLAN: -STAT CTA. -Transfuse PRBCs and monitor Hgb closely. -If CTA positive then needs to go to IR for embolization.   Laban Emperor. Zehr  07/18/2020, 2:13 PM

## 2020-07-18 NOTE — Plan of Care (Signed)

## 2020-07-19 ENCOUNTER — Telehealth: Payer: Self-pay | Admitting: Internal Medicine

## 2020-07-19 DIAGNOSIS — K5731 Diverticulosis of large intestine without perforation or abscess with bleeding: Secondary | ICD-10-CM | POA: Diagnosis present

## 2020-07-19 DIAGNOSIS — K5730 Diverticulosis of large intestine without perforation or abscess without bleeding: Secondary | ICD-10-CM | POA: Diagnosis present

## 2020-07-19 DIAGNOSIS — K922 Gastrointestinal hemorrhage, unspecified: Secondary | ICD-10-CM

## 2020-07-19 LAB — CBC
HCT: 34.1 % — ABNORMAL LOW (ref 39.0–52.0)
Hemoglobin: 10.8 g/dL — ABNORMAL LOW (ref 13.0–17.0)
MCH: 30 pg (ref 26.0–34.0)
MCHC: 31.7 g/dL (ref 30.0–36.0)
MCV: 94.7 fL (ref 80.0–100.0)
Platelets: 126 10*3/uL — ABNORMAL LOW (ref 150–400)
RBC: 3.6 MIL/uL — ABNORMAL LOW (ref 4.22–5.81)
RDW: 15.5 % (ref 11.5–15.5)
WBC: 9 10*3/uL (ref 4.0–10.5)
nRBC: 0 % (ref 0.0–0.2)

## 2020-07-19 LAB — TYPE AND SCREEN
ABO/RH(D): A POS
Antibody Screen: NEGATIVE
Unit division: 0
Unit division: 0
Unit division: 0
Unit division: 0

## 2020-07-19 LAB — COMPREHENSIVE METABOLIC PANEL
ALT: 9 U/L (ref 0–44)
AST: 14 U/L — ABNORMAL LOW (ref 15–41)
Albumin: 2.7 g/dL — ABNORMAL LOW (ref 3.5–5.0)
Alkaline Phosphatase: 40 U/L (ref 38–126)
Anion gap: 4 — ABNORMAL LOW (ref 5–15)
BUN: 26 mg/dL — ABNORMAL HIGH (ref 8–23)
CO2: 25 mmol/L (ref 22–32)
Calcium: 9.5 mg/dL (ref 8.9–10.3)
Chloride: 113 mmol/L — ABNORMAL HIGH (ref 98–111)
Creatinine, Ser: 1.29 mg/dL — ABNORMAL HIGH (ref 0.61–1.24)
GFR, Estimated: 52 mL/min — ABNORMAL LOW (ref >60)
Glucose, Bld: 128 mg/dL — ABNORMAL HIGH (ref 70–99)
Potassium: 4.3 mmol/L (ref 3.5–5.1)
Sodium: 142 mmol/L (ref 135–145)
Total Bilirubin: 1.4 mg/dL — ABNORMAL HIGH (ref 0.3–1.2)
Total Protein: 5.5 g/dL — ABNORMAL LOW (ref 6.5–8.1)

## 2020-07-19 LAB — BPAM RBC
Blood Product Expiration Date: 202205112359
Blood Product Expiration Date: 202205122359
Blood Product Expiration Date: 202205122359
Blood Product Expiration Date: 202205132359
ISSUE DATE / TIME: 202205081303
ISSUE DATE / TIME: 202205081656
ISSUE DATE / TIME: 202205082159
ISSUE DATE / TIME: 202205090631
Unit Type and Rh: 6200
Unit Type and Rh: 6200
Unit Type and Rh: 9500
Unit Type and Rh: 9500

## 2020-07-19 LAB — APTT: aPTT: 30 seconds (ref 24–36)

## 2020-07-19 LAB — PROTIME-INR
INR: 1.1 (ref 0.8–1.2)
Prothrombin Time: 14.5 seconds (ref 11.4–15.2)

## 2020-07-19 MED ORDER — AMLODIPINE BESYLATE 2.5 MG PO TABS
2.5000 mg | ORAL_TABLET | Freq: Every day | ORAL | Status: DC
  Administered 2020-07-19 – 2020-07-22 (×4): 2.5 mg via ORAL
  Filled 2020-07-19 (×4): qty 1

## 2020-07-19 MED ORDER — ATORVASTATIN CALCIUM 10 MG PO TABS
20.0000 mg | ORAL_TABLET | Freq: Every day | ORAL | Status: DC
  Administered 2020-07-19 – 2020-07-23 (×5): 20 mg via ORAL
  Filled 2020-07-19 (×5): qty 2

## 2020-07-19 NOTE — Progress Notes (Addendum)
Patient had two bowel movements that are red in color. RN notified Dr. Sharon Seller of this. Patient is resting in bed. Patient is asymptomatic. RN also asked Dr. Sharon Seller if a stat CBC can be ordered for patient.

## 2020-07-19 NOTE — Telephone Encounter (Signed)
Pt's daughter concerned that no one has contacted her abut her father's condition after being admitted on yesterday.   Daughter stated father was covered in blood on yesterday and was taken to the ED and the [facility] team admitted him. The Daughter wants to know why no one has contact her back as to what is going on with her father.    Please call back.

## 2020-07-19 NOTE — Progress Notes (Addendum)
Progress Note  Chief Complaint:  Rectal bleelding       ASSESSMENT / PLAN:     [name] is a 87 y.o. male with past medical history significant for DVT, CKD, hyperlipidemia, HTN, CVA, dementia. See PMH for additional history. Admitted yesterday with hematochezia   # Painless hematochezia / ABL anemia with decline in hgb from 13 to 10.9. Hgb not improved but stable at 10.8 after two units of blood yesterday. Probably a diverticular hemorrhage.  --Spoke with RN, no episodes of bleeding so far today --CTA negative for active bleeding. Diffuse wall thickening within the distal rectum which may represent some focal proctitis. Diverticulosis .  Small bowel and stomach appear within normal limits. --from GI standpoint he can have clears.  --Focal proctitis on CTA yesterday not likely to have caused significant bleeding. Proctitis could have been related to a recent impaction.     Attending Physician Note   I have taken an interval history, reviewed the chart and examined the patient. I agree with the Advanced Practitioner's note, impression and recommendations.   Painless hematochezia, ABL anemia. Bleeding has stopped. CTA was negative for bleed, focal proctitis was noted, likely related to recent impaction, and diverticulosis was noted. A stercoral ulcer is possible however suspect this is a diverticular bleed. Advance to clear liquids and observe for re-bleeding. Trend CBC.   Lucio Edward, MD FACG [phone]       SUBJECTIVE:   No complaints, resting comfortable.     OBJECTIVE:    Scheduled inpatient medications:  Continuous inpatient infusions:  . sodium chloride 125 mL/hr at 07/18/20 2203   PRN inpatient medications: acetaminophen **OR** acetaminophen  Vital signs in last 24 hours: Temp:  [97.8 F (36.6 C)-98.8 F (37.1 C)] 97.8 F (36.6 C) (05/09 0859) Pulse Rate:  [67-91] 82 (05/09 0859) Resp:  [14-28] 20 (05/09 0622) BP: (106-165)/(64-98) 122/74  (05/09 0859) SpO2:  [91 %-100 %] 95 % (05/09 0859) Weight:  [90.7 kg-91.1 kg] 91.1 kg (05/09 0100) Last BM Date: 07/18/20  Intake/Output Summary (Last 24 hours) at 07/19/2020 0917 Last data filed at 07/18/2020 1719 Gross per 24 hour  Intake 751 ml  Output --  Net 751 ml     Physical Exam:  . General: Alert male in NAD . Heart:  Regular rate and rhythm. No lower extremity edema . Pulmonary: Normal respiratory effort . Abdomen: Soft, nondistended, nontender. Normal bowel sounds.  . Psych: Pleasant. Cooperative.   Filed Weights   07/18/20 1246 07/19/20 0100  Weight: 90.7 kg 91.1 kg    Intake/Output from previous day: 05/08 0701 - 05/09 0700 In: 751 [Blood:751] Out: -  Intake/Output this shift: No intake/output data recorded.    Lab Results: Recent Labs    07/18/20 1304 07/19/20 0619  WBC 7.4 9.0  HGB 10.9* 10.8*  HCT 35.3* 34.1*  PLT 153 126*   BMET Recent Labs    07/18/20 1304 07/19/20 0619  NA 140 142  K 4.5 4.3  CL 108 113*  CO2 28 25  GLUCOSE 111* 128*  BUN 27* 26*  CREATININE 1.34* 1.29*  CALCIUM 9.6 9.5   LFT Recent Labs    07/19/20 0619  PROT 5.5*  ALBUMIN 2.7*  AST 14*  ALT 9  ALKPHOS 40  BILITOT 1.4*   PT/INR Recent Labs    07/18/20 1304 07/19/20 0619  LABPROT 14.3 14.5  INR 1.1 1.1   Hepatitis Panel No results for input(s): HEPBSAG, HCVAB, HEPAIGM, HEPBIGM in  the last 72 hours.  CT Angio Abd/Pel W and/or Wo Contrast  Result Date: 07/18/2020 CLINICAL DATA:  History of GI bleeding, initial encounter EXAM: CTA ABDOMEN AND PELVIS WITHOUT AND WITH CONTRAST TECHNIQUE: Multidetector CT imaging of the abdomen and pelvis was performed using the standard protocol during bolus administration of intravenous contrast. Multiplanar reconstructed images and MIPs were obtained and reviewed to evaluate the vascular anatomy. CONTRAST:  13mL OMNIPAQUE IOHEXOL 350 MG/ML SOLN COMPARISON:  02/21/2013, 09/15/2013 FINDINGS: VASCULAR Aorta: Initial  precontrast images demonstrate atherosclerotic calcifications without aneurysmal dilatation. Post-contrast images demonstrate no evidence of dissection or aneurysmal dilatation. Celiac: Patent without evidence of aneurysm, dissection, vasculitis or significant stenosis. SMA: Patent without evidence of aneurysm, dissection, vasculitis or significant stenosis. Renals: Mild atherosclerotic changes are noted. Single renal arteries are identified bilaterally. IMA: Patent without evidence of aneurysm, dissection, vasculitis or significant stenosis. Iliacs: Patent without evidence of aneurysm, dissection, vasculitis or significant stenosis. Veins: No specific venous abnormality is noted. IVC filter is noted in place. Review of the MIP images confirms the above findings. NON-VASCULAR Lower chest: No acute abnormality. Hepatobiliary: Multiple cysts are noted throughout the liver. Gallbladder is well distended with multiple dependent gallstones. No biliary ductal dilatation is seen. Pancreas: Unremarkable. No pancreatic ductal dilatation or surrounding inflammatory changes. Spleen: Normal in size without focal abnormality. Adrenals/Urinary Tract: Adrenal glands demonstrate stable left adrenal adenoma unchanged from the prior exam. Kidneys demonstrate multiple renal cysts. No renal calculi or obstructive changes are seen. The bladder is well distended. Stomach/Bowel: Diffuse wall thickening is noted within the distal rectum which may represent some focal proctitis. No abscess or perforation is noted. Diverticulosis without evidence of diverticulitis is noted. No obstructing or inflammatory lesions are seen in the colon. The appendix is within normal limits. No pooling of contrast is identified to suggest active arterial hemorrhage. Small bowel and stomach appear within normal limits. Lymphatic: No significant lymphadenopathy is noted. Reproductive: Prostate is unremarkable. Other: No abdominal wall hernia or abnormality. No  abdominopelvic ascites. Musculoskeletal: Degenerative changes of the hip joints and lumbar spine are noted. IMPRESSION: VASCULAR Atherosclerotic changes are noted without aneurysmal dilatation or dissection. No areas of pooling of contrast to suggest active GI hemorrhage. NON-VASCULAR Stable hepatic and renal cysts. Stable left adrenal adenoma. Mild wall thickening in the distal rectum which may represent a degree of proctitis. Again no active GI hemorrhage is noted. Electronically Signed   By: Inez Catalina M.D.   On: 07/18/2020 19:04    Active Problems:   Acute lower GI bleeding     LOS: 1 day   Tye Savoy ,NP 07/19/2020, 9:17 AM

## 2020-07-19 NOTE — Progress Notes (Addendum)
Subjective:  Overnight no acute events. 6 AM today (5/9) patient had two BM red in color per nurse. Patient resting in bed and asymptomatic. STAT CBC ordered and hgb 10.9. Later this AM patient remained asymptomatic. Denied chest pain, shortness of breath, abdominal pain, or dizziness. Daughter at bedside and discussed plan.   Objective:  Vital signs in last 24 hours: Vitals:   07/18/20 2218 07/18/20 2248 07/19/20 0100 07/19/20 0522  BP:  (!) 165/97  106/64  Pulse:  83  87  Resp:  19  20  Temp: 98.3 F (36.8 C) 98.8 F (37.1 C)  98.5 F (36.9 C)  TempSrc: Oral Oral  Oral  SpO2:  100%  95%  Weight:   91.1 kg   Height:   5\' 7"  (1.702 m)    Weight change:   Intake/Output Summary (Last 24 hours) at 07/19/2020 1610 Last data filed at 07/18/2020 1719 Gross per 24 hour  Intake 751 ml  Output --  Net 751 ml   Physical Exam Constitutional:      Appearance: Normal appearance.  HENT:     Head: Normocephalic.     Mouth/Throat:     Mouth: Mucous membranes are moist.  Eyes:     Conjunctiva/sclera: Conjunctivae normal.  Cardiovascular:     Rate and Rhythm: Normal rate and regular rhythm.     Heart sounds: Normal heart sounds.  Pulmonary:     Effort: Pulmonary effort is normal.  Abdominal:     General: Abdomen is flat. Bowel sounds are normal.     Palpations: Abdomen is soft.  Skin:    General: Skin is warm and dry.     Comments: Rash present on legs bilaterally.   Neurological:     General: No focal deficit present.     Mental Status: He is alert. Mental status is at baseline.  Psychiatric:        Mood and Affect: Mood normal.        Behavior: Behavior normal.    Assessment/Plan:  Active Problems:   Acute lower GI bleeding [name] is a 87 y.o. male with past medical history of HTN, HFpEF, CKD stage 3a, CVA, vascular dementia, DVT not on anticoagulation, and diverticular GI bleed who presents with large bright red blood per rectum for further work up of  lower GI bleed.   #Large volume bright red blood per rectum  #Acute GI bleed 2/2 diverticular hemorrhage  Patient presents with large volume bright red blood per rectum likely secondary to lower GI bleed. Not on anticoagulation. Hgb 10.8 stable from 10.9 on 5/8 (baseline 13) after receiving 2 units pRBCs.   CT angiogram of abdomen and pelvis showed no active GI bleeding. Findings revealed diverticulosis and thickening of distal bowel most consistent with focal proctitis. Bleeding most likely d/t diverticular hemorrhage. Per GI, patient on clear liquid diet. No plan for colonoscopy currently given hemoglobin and vitals stable.   -Continue to monitor for signs of bleeding. Currently hemodynamically stable.  -Clear liquid diet  -Trend CBC   -IV NS infusion 125 ml/hr  -GI following, appreciate recommendations   #Hypertension  Normotensive. Home regimen is amlodipine 5 mg daily. Will restart amlodipine at lower dose given he is at risk for hypotension with GI bleed.   -Start amlodipine 2.5 mg daily   #CKD Stage 3a Baseline Cr around 1.1-1.13. On admission Cr 1.34 and today (5/9) is 1.29.   -Continue to monitor Cr   #Heart failure with preserved ejection  fraction  EF 60-65% as of 09/2018 echo and taking PO Lasix 40 mg BID at home.   -Hold Lasix 40 mg BID given large volume stools    #History of cerebellar CVA  Home regimen includes atorvastatin 20 mg daily and ASA 81 mg daily.   -Resume atorvastatin 20 mg daily  #History of DVT  Provoked after TURP and diagnosed per doppler 06/10/2013. Complicated by hematuria while on anticoagulation s/p TURP. Initially on lovenox and coumadin, then stopped. Then IVC filter placed 06/16/13. Anticoagulation restarted after hematuria stopped. Lovenox was discontinued on 07/29/2013 with comment on high risk for falls. IVC in place. Xarelto also started in 2016 given history of CVA and complications from DVT, which was stopped after GI bleed in 2020.  Not on Xarelto now.   -Currently not on anticoagulation    LOS: 1 day   Jacqlyn Larsen, Medical Student 07/19/2020, 6:20 AM  Pager: [phone] After 5pm on weekdays and 1pm on weekends: On Call pager: [phone]

## 2020-07-19 NOTE — Hospital Course (Addendum)
Discharge Summary:   [name] is a 87 y.o. male with past medical history of HTN, CHF, CKD stage 3a, CVA, vascular dementia, DVT not on anticoagulation, and diverticular GI bleed who presented with large bright red blood per rectum for further work up of lower GI bleed 2/2 diverticular hemorrhage that resolved without intervention.    #Large volume bright red blood per rectum  #Lower GI bleed 2/2 diverticular hemorrhage  Patient presents with large volume bright red blood per rectum likely secondary to lower GI bleed. Has history of a diverticular GI bleed on Xarelto in 08/2018 that resolved without intervention and was seen by [facility]. Upon presentation was hemodynamically stable and not on Xarelto. Hgb was 10.9 and baseline ~13. Received IV fluids via 2 large bore IVs and 2 units pRBCS. [facility] consulted and CT angiogram of abdomen and pelvis showed no active bleed or signs of bleeding, diverticulosis, and thickening of distal bowel most likely c/w proctitis. Patient had two more bloody bowel movements on 5/9 morning and was asymptomatic at this time and hgb stable at 10.8. Transitioned from NPO to CLD to low sodium diet, which he tolerated well. Hgb remained stable between 8-9 during hospitalization. Had black tarry stool on afternoon of 5/11, asymptomatic at the time and vitals within normal limits. Physical therapy recommended SNF after admission.   #Hypertension  Normotensive throughout admission on 2.5 mg of amlodipine. Had one episode of asymptomatic hypertension overnight on 5/11 and given additional amlodipine 2.5 mg. Increased to amlodipine 5 mg on 5/12 for remainder of admission.    #CKD Stage 3a Baseline Cr around 1.1-1.13. On admission Cr 1.34 and stabilized at 1.16 on 5/11.    #Heart failure with preserved ejection fraction  EF 60-65% as of 09/2018 echo and taking PO Lasix 40 mg BID at home. Held Lasix 40 mg BID given large volume stools and risk of hypotension due to  bleeding. On 5/12, developed 1+ pitting edema, otherwise asymptomatic. Decided to continue holding lasix given recent bleeding and risk of hypotension with older age and frailty.    #History of cerebellar CVA  Home regimen includes atorvastatin 20 mg daily and ASA 81 mg daily.  Held ASA 81 mg and resumed atorvastatin 20 mg daily on 5/9.    #History of DVT  Provoked after TURP and diagnosed per doppler 06/10/2013. Complicated by hematuria while on anticoagulation s/p TURP. Initially on lovenox and coumadin, then stopped. Then IVC filter placed 06/16/13. Anticoagulation restarted after hematuria stopped. Lovenox was discontinued on 07/29/2013 with comment on high risk for falls. IVC still in place in 2016. Xarelto also started in 2016 given history of CVA and DVT and stopped in 2020 after GI bleed.

## 2020-07-19 NOTE — Telephone Encounter (Signed)
Yes thank you we did update [name] in detail.

## 2020-07-19 NOTE — Progress Notes (Signed)
Initial Nutrition Assessment  DOCUMENTATION CODES:   Obesity unspecified  INTERVENTION:   -RD will follow for diet advancement and adjust supplement regimen as appopriate  NUTRITION DIAGNOSIS:   Inadequate oral intake related to altered GI function as evidenced by NPO status.  GOAL:   Patient will meet greater than or equal to 90% of their needs  MONITOR:   PO intake,Supplement acceptance,Diet advancement,Labs,Weight trends,Skin,I & O's  REASON FOR ASSESSMENT:   Malnutrition Screening Tool    ASSESSMENT:   [name] is a 87 y.o. male with past medical history of HTN, CHF, CKD stage 3a, CVA, vascular dementia, DVT not on anticoagulation, and diverticular GI bleed who presents with large bright red blood per rectum for further work up of lower GI bleed.  Pt admitted with lower GIB.   Reviewed I/O's: +751 ml x 24 hours  Pt unavailable at time of visit. Attempted to speak with pt via call to hospital room phone, however, unable to reach. RD unable to obtain further nutrition-related history or complete nutrition-focused physical exam at this time.   Per H&P, pt lives at home with his daughter, who is his caretaker.   Plan for CT angiogram of abdomen and pelvis to identify diverticular bleed. Pt with two BMs this morning that were red in color.   Reviewed wt hx; wt has been stable over the past year.   Medications reviewed.  Labs reviewed.   Diet Order:   Diet Order            Diet NPO time specified  Diet effective now                 EDUCATION NEEDS:   No education needs have been identified at this time  Skin:  Skin Assessment: Reviewed RN Assessment  Last BM:  07/18/20  Height:   Ht Readings from Last 1 Encounters:  07/19/20 5\' 7"  (1.702 m)    Weight:   Wt Readings from Last 1 Encounters:  07/19/20 91.1 kg    Ideal Body Weight:  67.3 kg  BMI:  Body mass index is 31.46 kg/m.  Estimated Nutritional Needs:   Kcal:   1800-2000  Protein:  90-105 grams  Fluid:  > 1.8 L    Loistine Chance, RD, LDN, [place] Registered Dietitian II Certified Diabetes Care and Education Specialist Please refer to [facility] for RD and/or RD on-call/weekend/after hours pager

## 2020-07-19 NOTE — Telephone Encounter (Signed)
RTC, [name] states that Dr. Heber Oatfield and his team just left and all her questions were answered. RN encouraged daughter to ask in-patient nurses any question or concerns she may have and informed her the inpatient nurses will be able to get in touch with the [facility] inpatient team while patient is hospitalized.  She is very Paediatric nurse, RN,BSN

## 2020-07-20 LAB — COMPREHENSIVE METABOLIC PANEL
ALT: 10 U/L (ref 0–44)
AST: 13 U/L — ABNORMAL LOW (ref 15–41)
Albumin: 2.4 g/dL — ABNORMAL LOW (ref 3.5–5.0)
Alkaline Phosphatase: 35 U/L — ABNORMAL LOW (ref 38–126)
Anion gap: 6 (ref 5–15)
BUN: 31 mg/dL — ABNORMAL HIGH (ref 8–23)
CO2: 24 mmol/L (ref 22–32)
Calcium: 9.3 mg/dL (ref 8.9–10.3)
Chloride: 110 mmol/L (ref 98–111)
Creatinine, Ser: 1.18 mg/dL (ref 0.61–1.24)
GFR, Estimated: 58 mL/min — ABNORMAL LOW (ref >60)
Glucose, Bld: 104 mg/dL — ABNORMAL HIGH (ref 70–99)
Potassium: 3.9 mmol/L (ref 3.5–5.1)
Sodium: 140 mmol/L (ref 135–145)
Total Bilirubin: 0.4 mg/dL (ref 0.3–1.2)
Total Protein: 4.8 g/dL — ABNORMAL LOW (ref 6.5–8.1)

## 2020-07-20 LAB — CBC
HCT: 27.8 % — ABNORMAL LOW (ref 39.0–52.0)
HCT: 31.2 % — ABNORMAL LOW (ref 39.0–52.0)
Hemoglobin: 8.6 g/dL — ABNORMAL LOW (ref 13.0–17.0)
Hemoglobin: 9.7 g/dL — ABNORMAL LOW (ref 13.0–17.0)
MCH: 29.7 pg (ref 26.0–34.0)
MCH: 30.1 pg (ref 26.0–34.0)
MCHC: 30.9 g/dL (ref 30.0–36.0)
MCHC: 31.1 g/dL (ref 30.0–36.0)
MCV: 95.9 fL (ref 80.0–100.0)
MCV: 96.9 fL (ref 80.0–100.0)
Platelets: DECREASED 10*3/uL (ref 150–400)
Platelets: 131 10*3/uL — ABNORMAL LOW (ref 150–400)
RBC: 2.9 MIL/uL — ABNORMAL LOW (ref 4.22–5.81)
RBC: 3.22 MIL/uL — ABNORMAL LOW (ref 4.22–5.81)
RDW: 15 % (ref 11.5–15.5)
RDW: 14.8 % (ref 11.5–15.5)
WBC: 8 10*3/uL (ref 4.0–10.5)
WBC: 9.2 10*3/uL (ref 4.0–10.5)
nRBC: 0 % (ref 0.0–0.2)
nRBC: 0 % (ref 0.0–0.2)

## 2020-07-20 NOTE — Progress Notes (Signed)
Subjective:  Patient had bloody BM with no change in vitals overnight. Patient states that he is feeling well this morning and reports no pain. Tolerated clear liquids, now on low sodium diet. He ate boiled eggs for breakfast this morning. Spoke with his daughter [name] and provided an update on plan and answered questions.  Objective:  Vital signs in last 24 hours: Vitals:   07/19/20 1926 07/20/20 0518 07/20/20 0717 07/20/20 0944  BP: 115/74 123/79 125/82 139/79  Pulse: 80 76 79 73  Resp: 16 18 18    Temp: 98.1 F (36.7 C) 98.1 F (36.7 C) 98.4 F (36.9 C)   TempSrc: Oral Oral    SpO2: 95% 97% 93% 95%  Weight:  96.1 kg    Height:       Weight change: 5.381 kg  Intake/Output Summary (Last 24 hours) at 07/20/2020 1113 Last data filed at 07/20/2020 0905 Gross per 24 hour  Intake 2472.59 ml  Output 500 ml  Net 1972.59 ml   Physical Exam Constitutional:      Appearance: Normal appearance.  HENT:     Head: Normocephalic.     Mouth/Throat:     Mouth: Mucous membranes are moist.  Eyes:     Conjunctiva/sclera: Conjunctivae normal.  Cardiovascular:     Rate and Rhythm: Normal rate and regular rhythm.     Heart sounds: Normal heart sounds.  Pulmonary:     Effort: Pulmonary effort is normal.  Abdominal:     General: Abdomen is flat. Bowel sounds are normal.     Palpations: Abdomen is soft.  Skin:    General: Skin is warm and dry.     Comments: Rash present on legs bilaterally.   Neurological:     General: No focal deficit present.     Mental Status: He is alert. Mental status is at baseline.  Psychiatric:        Mood and Affect: Mood normal.        Behavior: Behavior normal.    Assessment/Plan:  Active Problems:   Vascular dementia ([place])   Acute lower GI bleeding   Diverticulosis of colon with hemorrhage [name] is a 87 y.o. male with past medical history of HTN, HFpEF, CKD stage 3a, CVA, vascular dementia, DVT not on anticoagulation, and  diverticular GI bleed who presents with large bright red blood per rectum for lower GI bleed.   #Large volume bright red blood per rectum  #Acute GI bleed 2/2 diverticular hemorrhage  Patient presents with large volume bright red blood per rectum likely secondary to lower GI bleed from diverticular hemorrhage. Not on anticoagulation.   Hgb decreased to 8.6 from 10.9. Given patient continues to have bloody BMs. Will recheck CBC this afternoon and transfuse if less than Hgb 7. Plan to monitor for bloody BMs and supportive care for now with IVF given that patient is asymptomatic and vitals are stable.   -Follow up afternoon CBC  -IV NS infusion 125 ml/hr -Continue to monitor for signs of bleeding. Currently hemodynamically stable.  -Low sodium diet  -Daily CBC   -GI following, appreciate recommendations   #Hypertension  Normotensive. Home regimen is amlodipine 5 mg daily. Will restart amlodipine at lower dose given he is at risk for hypotension with GI bleed.   -Start amlodipine 2.5 mg daily   #CKD Stage 3a Baseline Cr around 1.1-1.13. On admission Cr 1.34 and today (5/10) is 1.29.   -Continue to monitor Cr   #Heart failure with preserved ejection  fraction  EF 60-65% as of 09/2018 echo and taking PO Lasix 40 mg BID at home.   -Hold Lasix 40 mg BID given large volume stools    #History of cerebellar CVA  Home regimen includes atorvastatin 20 mg daily and ASA 81 mg daily.   -Resume atorvastatin 20 mg daily  #History of DVT  Provoked after TURP and diagnosed per doppler 06/10/2013. Complicated by hematuria while on anticoagulation s/p TURP. Initially on lovenox and coumadin, then stopped. Then IVC filter placed 06/16/13. Anticoagulation restarted after hematuria stopped. Lovenox was discontinued on 07/29/2013 with comment on high risk for falls. IVC in place. Xarelto also started in 2016 given history of CVA and complications from DVT, which was stopped after GI bleed in 2020. Not  on Xarelto now.   -Currently not on anticoagulation    LOS: 2 days   Jacqlyn Larsen, Medical Student 07/20/2020, 6:30 AM  Pager: [phone] After 5pm on weekdays and 1pm on weekends: On Call pager: [phone]

## 2020-07-20 NOTE — Progress Notes (Addendum)
Progress Note  Chief Complaint:   Rectal bleeding      ASSESSMENT / PLAN:    [name] is a 87 y.o. male with past medical history significant for DVT, CKD, hyperlipidemia, HTN, CVA, dementia. See PMH for additional history. Admitted yesterday with hematochezia   Painless hematochezia / ABL anemia. Probably a diverticular hemorrhage. Hgb 8.6, down from 10.8 yesterday but only episode of rectal bleeding since I saw him yesterday ( confirmed with RN).  --Advance to soft diet. RN will contact me should he have recurrent bleeding    Attending Physician Note   I have taken an interval history, reviewed the chart and examined the patient. I agree with the Advanced Practitioner's note, impression and recommendations.   Painless hematochezia has resolved. Suspected diverticular bleed. ABL anemia, Hgb equilibrating. Family and patient agree with no colonoscopy given age, comorbidites. Advance to soft diet and trend Hgb. GI follow up with Dr. Bryan Lemma as needed. Post hospital follow up with his PCP. GI signing off.    Lucio Edward, MD FACG [phone]      SUBJECTIVE:    No abdominal pain. No BMs or bleeding last night nor today   OBJECTIVE:    Scheduled inpatient medications:  . amLODipine  2.5 mg Oral Daily  . atorvastatin  20 mg Oral Daily   Continuous inpatient infusions:  . sodium chloride 125 mL/hr at 07/20/20 0944   PRN inpatient medications: acetaminophen **OR** acetaminophen  Vital signs in last 24 hours: Temp:  [97.5 F (36.4 C)-98.4 F (36.9 C)] 98.4 F (36.9 C) (05/10 0717) Pulse Rate:  [73-82] 73 (05/10 0944) Resp:  [16-18] 18 (05/10 0717) BP: (115-139)/(74-89) 139/79 (05/10 0944) SpO2:  [93 %-98 %] 95 % (05/10 0944) Weight:  [96.1 kg] 96.1 kg (05/10 0518) Last BM Date: 07/19/20  Intake/Output Summary (Last 24 hours) at 07/20/2020 1149 Last data filed at 07/20/2020 0905 Gross per 24 hour  Intake 2472.59 ml  Output 500 ml  Net 1972.59 ml      Physical Exam:  . General: Alert male in NAD in bedside chair . Heart:  Regular rate and rhythm, 1-2 + BLE edema . Pulmonary: Normal respiratory effort . Abdomen: Soft, nondistended, nontender. Normal bowel sounds.  . Psych: Pleasant. Cooperative.   Filed Weights   07/18/20 1246 07/19/20 0100 07/20/20 0518  Weight: 90.7 kg 91.1 kg 96.1 kg    Intake/Output from previous day: 05/09 0701 - 05/10 0700 In: 1772.6 [P.O.:360; I.V.:1412.6] Out: 750 [Urine:750] Intake/Output this shift: Total I/O In: 700 [P.O.:700] Out: -    Lab Results: Recent Labs    07/18/20 1304 07/19/20 0619 07/20/20 0429  WBC 7.4 9.0 8.0  HGB 10.9* 10.8* 8.6*  HCT 35.3* 34.1* 27.8*  PLT 153 126* PLATELET CLUMPS NOTED ON SMEAR, COUNT APPEARS DECREASED   BMET Recent Labs    07/18/20 1304 07/19/20 0619 07/20/20 0429  NA 140 142 140  K 4.5 4.3 3.9  CL 108 113* 110  CO2 28 25 24   GLUCOSE 111* 128* 104*  BUN 27* 26* 31*  CREATININE 1.34* 1.29* 1.18  CALCIUM 9.6 9.5 9.3   LFT Recent Labs    07/20/20 0429  PROT 4.8*  ALBUMIN 2.4*  AST 13*  ALT 10  ALKPHOS 35*  BILITOT 0.4   PT/INR Recent Labs    07/18/20 1304 07/19/20 0619  LABPROT 14.3 14.5  INR 1.1 1.1    CT Angio Abd/Pel W and/or Wo Contrast  Result Date: 07/18/2020 CLINICAL  DATA:  History of GI bleeding, initial encounter EXAM: CTA ABDOMEN AND PELVIS WITHOUT AND WITH CONTRAST TECHNIQUE: Multidetector CT imaging of the abdomen and pelvis was performed using the standard protocol during bolus administration of intravenous contrast. Multiplanar reconstructed images and MIPs were obtained and reviewed to evaluate the vascular anatomy. CONTRAST:  129mL OMNIPAQUE IOHEXOL 350 MG/ML SOLN COMPARISON:  02/21/2013, 09/15/2013 FINDINGS: VASCULAR Aorta: Initial precontrast images demonstrate atherosclerotic calcifications without aneurysmal dilatation. Post-contrast images demonstrate no evidence of dissection or aneurysmal dilatation. Celiac:  Patent without evidence of aneurysm, dissection, vasculitis or significant stenosis. SMA: Patent without evidence of aneurysm, dissection, vasculitis or significant stenosis. Renals: Mild atherosclerotic changes are noted. Single renal arteries are identified bilaterally. IMA: Patent without evidence of aneurysm, dissection, vasculitis or significant stenosis. Iliacs: Patent without evidence of aneurysm, dissection, vasculitis or significant stenosis. Veins: No specific venous abnormality is noted. IVC filter is noted in place. Review of the MIP images confirms the above findings. NON-VASCULAR Lower chest: No acute abnormality. Hepatobiliary: Multiple cysts are noted throughout the liver. Gallbladder is well distended with multiple dependent gallstones. No biliary ductal dilatation is seen. Pancreas: Unremarkable. No pancreatic ductal dilatation or surrounding inflammatory changes. Spleen: Normal in size without focal abnormality. Adrenals/Urinary Tract: Adrenal glands demonstrate stable left adrenal adenoma unchanged from the prior exam. Kidneys demonstrate multiple renal cysts. No renal calculi or obstructive changes are seen. The bladder is well distended. Stomach/Bowel: Diffuse wall thickening is noted within the distal rectum which may represent some focal proctitis. No abscess or perforation is noted. Diverticulosis without evidence of diverticulitis is noted. No obstructing or inflammatory lesions are seen in the colon. The appendix is within normal limits. No pooling of contrast is identified to suggest active arterial hemorrhage. Small bowel and stomach appear within normal limits. Lymphatic: No significant lymphadenopathy is noted. Reproductive: Prostate is unremarkable. Other: No abdominal wall hernia or abnormality. No abdominopelvic ascites. Musculoskeletal: Degenerative changes of the hip joints and lumbar spine are noted. IMPRESSION: VASCULAR Atherosclerotic changes are noted without aneurysmal  dilatation or dissection. No areas of pooling of contrast to suggest active GI hemorrhage. NON-VASCULAR Stable hepatic and renal cysts. Stable left adrenal adenoma. Mild wall thickening in the distal rectum which may represent a degree of proctitis. Again no active GI hemorrhage is noted. Electronically Signed   By: Inez Catalina M.D.   On: 07/18/2020 19:04     LOS: 2 days   Tye Savoy, NP 07/20/2020, 11:49 AM

## 2020-07-21 LAB — CBC
HCT: 26.9 % — ABNORMAL LOW (ref 39.0–52.0)
HCT: 27.5 % — ABNORMAL LOW (ref 39.0–52.0)
Hemoglobin: 8.3 g/dL — ABNORMAL LOW (ref 13.0–17.0)
Hemoglobin: 8.5 g/dL — ABNORMAL LOW (ref 13.0–17.0)
MCH: 30.1 pg (ref 26.0–34.0)
MCH: 29.8 pg (ref 26.0–34.0)
MCHC: 30.9 g/dL (ref 30.0–36.0)
MCHC: 30.9 g/dL (ref 30.0–36.0)
MCV: 97.5 fL (ref 80.0–100.0)
MCV: 96.5 fL (ref 80.0–100.0)
Platelets: 123 10*3/uL — ABNORMAL LOW (ref 150–400)
Platelets: 123 10*3/uL — ABNORMAL LOW (ref 150–400)
RBC: 2.76 MIL/uL — ABNORMAL LOW (ref 4.22–5.81)
RBC: 2.85 MIL/uL — ABNORMAL LOW (ref 4.22–5.81)
RDW: 14.7 % (ref 11.5–15.5)
RDW: 14.6 % (ref 11.5–15.5)
WBC: 7.7 10*3/uL (ref 4.0–10.5)
WBC: 8.4 10*3/uL (ref 4.0–10.5)
nRBC: 0 % (ref 0.0–0.2)
nRBC: 0 % (ref 0.0–0.2)

## 2020-07-21 LAB — COMPREHENSIVE METABOLIC PANEL
ALT: 10 U/L (ref 0–44)
AST: 13 U/L — ABNORMAL LOW (ref 15–41)
Albumin: 2.5 g/dL — ABNORMAL LOW (ref 3.5–5.0)
Alkaline Phosphatase: 35 U/L — ABNORMAL LOW (ref 38–126)
Anion gap: 6 (ref 5–15)
BUN: 21 mg/dL (ref 8–23)
CO2: 21 mmol/L — ABNORMAL LOW (ref 22–32)
Calcium: 9.3 mg/dL (ref 8.9–10.3)
Chloride: 113 mmol/L — ABNORMAL HIGH (ref 98–111)
Creatinine, Ser: 1.16 mg/dL (ref 0.61–1.24)
GFR, Estimated: 59 mL/min — ABNORMAL LOW (ref >60)
Glucose, Bld: 105 mg/dL — ABNORMAL HIGH (ref 70–99)
Potassium: 3.9 mmol/L (ref 3.5–5.1)
Sodium: 140 mmol/L (ref 135–145)
Total Bilirubin: 0.4 mg/dL (ref 0.3–1.2)
Total Protein: 4.9 g/dL — ABNORMAL LOW (ref 6.5–8.1)

## 2020-07-21 MED ORDER — AMLODIPINE BESYLATE 2.5 MG PO TABS
2.5000 mg | ORAL_TABLET | Freq: Every day | ORAL | Status: AC
  Administered 2020-07-22: 2.5 mg via ORAL
  Filled 2020-07-21: qty 1

## 2020-07-21 MED ORDER — ADULT MULTIVITAMIN W/MINERALS CH
1.0000 | ORAL_TABLET | Freq: Every day | ORAL | Status: DC
  Administered 2020-07-21 – 2020-07-23 (×3): 1 via ORAL
  Filled 2020-07-21 (×3): qty 1

## 2020-07-21 NOTE — Progress Notes (Signed)
Nutrition Follow-up  DOCUMENTATION CODES:   Obesity unspecified  INTERVENTION:   -MVI with minerals daily -Magic cup BID with meals, each supplement provides 290 kcal and 9 grams of protein  NUTRITION DIAGNOSIS:   Inadequate oral intake related to altered GI function as evidenced by NPO status.  Progressing; advanced to soft diet  GOAL:   Patient will meet greater than or equal to 90% of their needs  Progressing   MONITOR:   PO intake,Supplement acceptance,Diet advancement,Labs,Weight trends,Skin,I & O's  REASON FOR ASSESSMENT:   Malnutrition Screening Tool    ASSESSMENT:   [name] is a 87 y.o. male with past medical history of HTN, CHF, CKD stage 3a, CVA, vascular dementia, DVT not on anticoagulation, and diverticular GI bleed who presents with large bright red blood per rectum for further work up of lower GI bleed.  5/9- advanced to clear liquid diet 5/10- advanced to soft diet  Reviewed I/O's: -850 ml x 24 hours and +924 ml since admission  UOP: 1.6 L x 24 hours  Per MD notes, no plans for surgical intervention at this time.   Spoke with pt at bedside, who was pleasantly confused at time of visit. Observed breakfast tray- pt consumed all expect 1/3 of a bowl of grits. Noted meal completions 100%.   Medications reviewed and include 0.9% sodium chloride infusion @ 125 ml/hr.   Labs reviewed.   NUTRITION - FOCUSED PHYSICAL EXAM:  Flowsheet Row Most Recent Value  Orbital Region No depletion  Upper Arm Region No depletion  Thoracic and Lumbar Region No depletion  Buccal Region No depletion  Temple Region No depletion  Clavicle Bone Region No depletion  Clavicle and Acromion Bone Region No depletion  Scapular Bone Region No depletion  Dorsal Hand Mild depletion  Patellar Region No depletion  Anterior Thigh Region No depletion  Posterior Calf Region No depletion  Edema (RD Assessment) Mild  Hair Reviewed  Eyes Reviewed  Mouth Reviewed   Skin Reviewed  Nails Reviewed       Diet Order:   Diet Order            DIET SOFT Room service appropriate? Yes; Fluid consistency: Thin  Diet effective now                 EDUCATION NEEDS:   No education needs have been identified at this time  Skin:  Skin Assessment: Reviewed RN Assessment  Last BM:  07/18/20  Height:   Ht Readings from Last 1 Encounters:  07/19/20 5\' 7"  (1.702 m)    Weight:   Wt Readings from Last 1 Encounters:  07/21/20 93.5 kg    Ideal Body Weight:  67.3 kg  BMI:  Body mass index is 32.28 kg/m.  Estimated Nutritional Needs:   Kcal:  1800-2000  Protein:  90-105 grams  Fluid:  > 1.8 L    Loistine Chance, RD, LDN, [place] Registered Dietitian II Certified Diabetes Care and Education Specialist Please refer to [facility] for RD and/or RD on-call/weekend/after hours pager

## 2020-07-21 NOTE — Progress Notes (Addendum)
Subjective:  Patient had no BMs overnight. Feeling well this morning and would like to go home today. Discussed with patient and daughter will observe for bloody BMs and afternoon hemoglobin today and they voiced understanding.   Objective:  Vital signs in last 24 hours: Vitals:   07/20/20 1156 07/20/20 2001 07/21/20 0015 07/21/20 0517  BP: 132/87 (!) 146/82 129/84 137/82  Pulse: 73 84 84 85  Resp: 16 20 (!) 22 18  Temp: 97.8 F (36.6 C) 98.6 F (37 C) 98.7 F (37.1 C) 98.4 F (36.9 C)  TempSrc: Oral Oral Oral Oral  SpO2: 99% 96% 94% 95%  Weight:    93.5 kg  Height:       Weight change: -2.6 kg  Intake/Output Summary (Last 24 hours) at 07/21/2020 0706 Last data filed at 07/21/2020 0558 Gross per 24 hour  Intake 700 ml  Output 1550 ml  Net -850 ml   Physical Exam Constitutional:      Appearance: Normal appearance.  HENT:     Head: Normocephalic.     Mouth/Throat:     Mouth: Mucous membranes are moist.  Eyes:     Conjunctiva/sclera: Conjunctivae normal.  Cardiovascular:     Rate and Rhythm: Normal rate and regular rhythm.     Heart sounds: Normal heart sounds.  Pulmonary:     Effort: Pulmonary effort is normal.  Abdominal:     General: Abdomen is flat. Bowel sounds are normal.     Palpations: Abdomen is soft.  Skin:    General: Skin is warm and dry.     Comments: Rash present on legs bilaterally.   Neurological:     General: No focal deficit present.     Mental Status: He is alert. Mental status is at baseline.  Psychiatric:        Mood and Affect: Mood normal.        Behavior: Behavior normal.    Assessment/Plan:  Active Problems:   Vascular dementia ([place])   Acute lower GI bleeding   Diverticulosis of colon with hemorrhage Mr. Squire Withey is a 87 y.o. male with past medical history of HTN, HFpEF, CKD stage 3a, CVA, vascular dementia, DVT not on anticoagulation, and diverticular GI bleed who presents with pain hematochezia d/t lower GI bleed 2/2  diverticular hemorrhage.    #Large volume bright red blood per rectum  #Acute GI bleed 2/2 diverticular hemorrhage  Patient presents with large volume bright red blood per rectum likely secondary to lower GI bleed from diverticular hemorrhage. Not on anticoagulation.   Hgb stable at 8.3 (5/10 afternoon CBC 9.2). Will continue to monitor for signs of bleeding for 1 more day and likely discharge tomorrow.   -Follow up 5/11 afternoon CBC  -Continue to monitor for signs of bleeding. Currently hemodynamically stable.  -Low sodium diet  -Daily CBC   -GI fsigned off  #Hypertension  Normotensive. Home regimen is amlodipine 5 mg daily. Will restart amlodipine at lower dose given he is at risk for hypotension with GI bleed.    -Start amlodipine 2.5 mg daily    #CKD Stage 3a Baseline Cr around 1.1-1.13. On admission Cr 1.34 and today (5/11) is 1.16.    #Heart failure with preserved ejection fraction  EF 60-65% as of 09/2018 echo and taking PO Lasix 40 mg BID at home.    -Hold Lasix 40 mg BID given large volume stools     #History of cerebellar CVA  Home regimen includes atorvastatin 20 mg daily  and ASA 81 mg daily.    -Resume atorvastatin 20 mg daily   #History of DVT  Provoked after TURP and diagnosed per doppler 06/10/2013. Complicated by hematuria while on anticoagulation s/p TURP. Initially on lovenox and coumadin, then stopped. Then IVC filter placed 06/16/13. Anticoagulation restarted after hematuria stopped. Lovenox was discontinued on 07/29/2013 with comment on high risk for falls. IVC in place. Xarelto also started in 2016 given history of CVA and complications from DVT, which was stopped after GI bleed in 2020. Not on Xarelto now.    -Currently not on anticoagulation    LOS: 3 days   Jacqlyn Larsen, Medical Student 07/21/2020, 7:06 AM  Pager: [phone] After 5pm on weekdays and 1pm on weekends: On Call pager: [phone]

## 2020-07-21 NOTE — Evaluation (Addendum)
Physical Therapy Evaluation Patient Details Name: [name] MRN: [id number] DOB: [birth year] Today's Date: 07/21/2020   History of Present Illness  Pt is 87 yo male presented on 07/18/20 with large bright red blood per rectum - admitted with lower GI bleed.  GI consulted and reports suspected diverticular bleed, no colonscopy due to age, comorbidities.  PMH includes vascular dementia, CVA, meningioma, HTN, CHF, and hx of GIB.  Clinical Impression   Pt admitted with above diagnosis. Pt is from home where daughter provides 24 hr care.  He was getting HHPT due to recent hospitalization.  Pt was able to ambulate in home with RW and supervision, he was requiring min A for transfers and all ADLs.  Today, pt requiring heavy mod A for bed mobility and sit to stands.  He had strong posterior lean and was unable to pivot or take steps.  Pt was HOH and with dementia , but was able to follow commands/cues with multimodal cues and is motivated.  Pt is below his baseline and would benefit from SNF at d/c.  Pt currently with functional limitations due to the deficits listed below (see PT Problem List). Pt will benefit from skilled PT to increase their independence and safety with mobility to allow discharge to the venue listed below.    Pt would likely benefit from having his shoes for improved transfers and prevent feet sliding in grip socks.      Follow Up Recommendations SNF ([name] wants [name] or [name])    Equipment Recommendations  None recommended by PT    Recommendations for Other Services       Precautions / Restrictions Precautions Precautions: Fall      Mobility  Bed Mobility Overal bed mobility: Needs Assistance Bed Mobility: Rolling Rolling: Mod assist   Supine to sit: Mod assist;HOB elevated Sit to supine: Mod assist;HOB elevated   General bed mobility comments: increased time and use of bed rails    Transfers Overall transfer level: Needs assistance Equipment  used: Rolling walker (2 wheeled) Transfers: Sit to/from Stand;Lateral/Scoot Transfers Sit to Stand: From elevated surface;Mod assist        Lateral/Scoot Transfers: Mod assist General transfer comment: Performed sit to stand x 3 with heavy mod A and bed signficantly elevated, pt with strong posterior lean - did improve some with repetition and cues to get head forward before standing.  Unable to pivot or take steps - some limitations due to socks slipping - would benefit from shoes. Pt did sit and lateral scoot toward [facility] with assist for hand placement and mod A with bed pad  Ambulation/Gait                Stairs            Wheelchair Mobility    Modified Rankin (Stroke Patients Only)       Balance Overall balance assessment: Needs assistance Sitting-balance support: No upper extremity supported;Bilateral upper extremity supported Sitting balance-Leahy Scale: Fair Sitting balance - Comments: Tendency to posterior lean initially and with challenges to balance.  Preferred UE support but could maintain without  for static sitting Postural control: Posterior lean Standing balance support: Bilateral upper extremity supported Standing balance-Leahy Scale: Poor Standing balance comment: Pt with posterior lean requiring mod A.  Performed 3 stands - first 2 < 10 seconds.  But on third stand had pt focus on leaning forward with visual target before standing and he was able to stand for ~20 seconds with slight improvement in  posterior lean                             Pertinent Vitals/Pain Pain Assessment: No/denies pain    Home Living Family/patient expects to be discharged to:: Private residence Living Arrangements: Children Available Help at Discharge: Family;Available 24 hours/day Type of Home: House Home Access: Ramped entrance     Home Layout: One level Home Equipment: Walker - 2 wheels;Shower seat;Wheelchair - manual;Hospital bed;Grab bars -  tub/shower;Bedside commode Additional Comments: per daughter    Prior Function Level of Independence: Needs assistance   Gait / Transfers Assistance Needed: Pt is able to walk in house using RW using supervision.  Daughter reports min A to get OOB and stand.  ADL's / Homemaking Assistance Needed: Pt needing min A with ADLs - sponge baths standing with daughter assisting; daughter assist with toielting ADLs and pulling up pants  Comments: Getting HHPT     Hand Dominance   Dominant Hand: Right    Extremity/Trunk Assessment   Upper Extremity Assessment Upper Extremity Assessment: RUE deficits/detail;LUE deficits/detail RUE Deficits / Details: ROM: Bil shoulder elevation to only ~90 degrees, otherwise WFL; MMT: 3/5 shoulders, 4/5 hand/elbow LUE Deficits / Details: ROM: Bil shoulder elevation to only ~90 degrees, otherwise WFL; MMT: 3/5 shoulders, 4/5 hand/elbow    Lower Extremity Assessment Lower Extremity Assessment: LLE deficits/detail;RLE deficits/detail RLE Deficits / Details: ROM WFL; MMT 4/5 LLE Deficits / Details: ROM WFL; MMT 4/5    Cervical / Trunk Assessment Cervical / Trunk Assessment: Normal  Communication   Communication: HOH;Expressive difficulties  Cognition Arousal/Alertness: Awake/alert Behavior During Therapy: WFL for tasks assessed/performed Overall Cognitive Status: History of cognitive impairments - at baseline                                 General Comments: Pt oriented to self and hospital , followed basic commands with gestures, more limited due to [facility] than confusion      General Comments General comments (skin integrity, edema, etc.): Spoke with daughter on phone and obtained PLOF and also that pt would likely need SNF at d/c - she states she prefers [employer]    Exercises     Assessment/Plan    PT Assessment Patient needs continued PT services  PT Problem List Decreased strength;Decreased range of motion;Decreased  mobility;Decreased safety awareness;Decreased coordination;Decreased activity tolerance;Decreased balance;Decreased knowledge of use of DME       PT Treatment Interventions DME instruction;Therapeutic activities;Gait training;Therapeutic exercise;Patient/family education;Balance training;Functional mobility training;Wheelchair mobility training    PT Goals (Current goals can be found in the Care Plan section)  Acute Rehab PT Goals Patient Stated Goal: return home PT Goal Formulation: With patient/family Time For Goal Achievement: 08/04/20 Potential to Achieve Goals: Fair    Frequency Min 2X/week   Barriers to discharge        Co-evaluation               AM-PAC PT "6 Clicks" Mobility  Outcome Measure Help needed turning from your back to your side while in a flat bed without using bedrails?: A Little Help needed moving from lying on your back to sitting on the side of a flat bed without using bedrails?: A Lot Help needed moving to and from a bed to a chair (including a wheelchair)?: Total Help needed standing up from a chair using your arms (e.g., wheelchair or  bedside chair)?: A Lot Help needed to walk in hospital room?: Total Help needed climbing 3-5 steps with a railing? : Total 6 Click Score: 10    End of Session Equipment Utilized During Treatment: Gait belt Activity Tolerance: Patient tolerated treatment well Patient left: in bed;with call bell/phone within reach;with bed alarm set;with SCD's reapplied Nurse Communication: Mobility status;Other (comment) (Dark stool on bed pad that was removed; pt cleaned) PT Visit Diagnosis: Unsteadiness on feet (R26.81);Muscle weakness (generalized) (M62.81)    Time: 1705-1740 PT Time Calculation (min) (ACUTE ONLY): 35 min   Charges:   PT Evaluation $PT Eval Moderate Complexity: 1 Mod PT Treatments $Therapeutic Activity: 8-22 mins        Abran Richard, PT Acute Rehab Services Pager [phone] [facility]  [phone]    Karlton Lemon 07/21/2020, 6:02 PM

## 2020-07-22 LAB — CBC
HCT: 27.3 % — ABNORMAL LOW (ref 39.0–52.0)
Hemoglobin: 8.6 g/dL — ABNORMAL LOW (ref 13.0–17.0)
MCH: 30.1 pg (ref 26.0–34.0)
MCHC: 31.5 g/dL (ref 30.0–36.0)
MCV: 95.5 fL (ref 80.0–100.0)
Platelets: 123 10*3/uL — ABNORMAL LOW (ref 150–400)
RBC: 2.86 MIL/uL — ABNORMAL LOW (ref 4.22–5.81)
RDW: 14.3 % (ref 11.5–15.5)
WBC: 16.3 10*3/uL — ABNORMAL HIGH (ref 4.0–10.5)
nRBC: 0 % (ref 0.0–0.2)

## 2020-07-22 LAB — SARS CORONAVIRUS 2 (TAT 6-24 HRS): SARS Coronavirus 2: NEGATIVE

## 2020-07-22 MED ORDER — AMLODIPINE BESYLATE 5 MG PO TABS
5.0000 mg | ORAL_TABLET | Freq: Every day | ORAL | Status: DC
  Administered 2020-07-23: 5 mg via ORAL
  Filled 2020-07-22: qty 1

## 2020-07-22 NOTE — Progress Notes (Addendum)
Subjective:  Patient had episode of asymptomatic hypertension over night BP 180/100. Given additional 2.5 mg amlodipine. Feeling well this morning and has been normotensive. Spoke with patient's daughter about going to SNF vs going home with homehealth. She would prefer if patient went to SNF to regain strength after deconditioning in the hospital. Prefers placement at [facility] or [facility].   Per patient's nurse this afternoon, patient has large black tarry stool. No bright red blood or active bleeding. Asymptomatic. Vitals stable.   Objective:  Vital signs in last 24 hours: Vitals:   07/21/20 2103 07/21/20 2308 07/22/20 0542 07/22/20 1158  BP: (!) 180/94 (!) 180/100 126/70 135/68  Pulse: 90 100 88 84  Resp: 18 18 18 18   Temp: 98.6 F (37 C) 98.9 F (37.2 C) 98.9 F (37.2 C) 98.8 F (37.1 C)  TempSrc: Oral Oral Oral Oral  SpO2: 97% 94% 94% 94%  Weight:   93.6 kg   Height:       Weight change: 0.1 kg  Intake/Output Summary (Last 24 hours) at 07/22/2020 1220 Last data filed at 07/22/2020 0905 Gross per 24 hour  Intake 680 ml  Output 1250 ml  Net -570 ml   Physical Exam Constitutional:      Appearance: Normal appearance.  HENT:     Head: Normocephalic.     Mouth/Throat:     Mouth: Mucous membranes are moist.  Eyes:     Conjunctiva/sclera: Conjunctivae normal.  Cardiovascular:     Rate and Rhythm: Normal rate and regular rhythm.     Heart sounds: Normal heart sounds.  Pulmonary:     Effort: Pulmonary effort is normal.  Abdominal:     General: Abdomen is flat. Bowel sounds are normal.     Palpations: Abdomen is soft.  Musculoskeletal:     Comments: 1+ pitting edema on legs bilaterally  Skin:    General: Skin is warm and dry.     Comments: Rash present on legs bilaterally.   Neurological:     General: No focal deficit present.     Mental Status: He is alert. Mental status is at baseline.  Psychiatric:        Mood and Affect: Mood normal.        Behavior:  Behavior normal.    Assessment/Plan:  Active Problems:   Vascular dementia ([place])   Acute lower GI bleeding   Diverticulosis of colon with hemorrhage  [name] is a 87 y.o. male with past medical history of HTN, HFpEF, CKD stage 3a, CVA, vascular dementia, DVT not on anticoagulation, and diverticular GI bleed who presents with pain hematochezia d/t lower GI bleed 2/2 diverticular hemorrhage.    #Large volume bright red blood per rectum  #Acute GI bleed 2/2 diverticular hemorrhage  Hgb stable at 8.6 (5/11 afternoon CBC 8.5) and hemodynamically stable. Patient had large, black tarry stool this afternoon. No active bleeding or bright red blood in stool and was stable without symptoms. Prior to this, patient had gone 24 hours without bloody stools. Given patient is hemodynamically WNL and asymptomatic with stable Hgb, will continue to monitor for bloody stools while he is awaiting placement at a SNF.    -Continue to monitor for bloody stools  -Per PT recommendations, will d/c to SNF. Awaiting placement.  -Low sodium diet  -Daily CBC   -D/ced IVF on 5/11  -GI signed off  #Hypertension  Patient had episode of asymptomatic hypertension overnight. Otherwise has been normotensive on 2.5 mg amlodipine.  Will resume home regimen of amlodipine 5 mg   -Restart amlodipine 5 mg daily   #Heart failure with preserved ejection fraction (60-65%) Patient slightly volume up on exam with lower extremity edema but no symptoms otherwise. Will continue to monitor exam and symptoms prior to starting Lasix, given he has been having GI bleed.    -Continue to hold Lasix 40 mg BID   -Monitor urine output and volume status on exam     #CKD Stage 3a Baseline Cr around 1.1-1.13. On admission Cr 1.34 and decreased to 1.16 on 5/11.     #History of cerebellar CVA  Home regimen includes atorvastatin 20 mg daily and ASA 81 mg daily.    -atorvastatin 20 mg daily    #History of DVT  Provoked after  TURP and diagnosed per doppler 06/10/2013. Complicated by hematuria while on anticoagulation s/p TURP. Initially on lovenox and coumadin, then stopped. Then IVC filter placed 06/16/13. Anticoagulation restarted after hematuria stopped. Lovenox was discontinued on 07/29/2013 with comment on high risk for falls. IVC in place. Xarelto also started in 2016 given history of CVA and complications from DVT, which was stopped after GI bleed in 2020. Not on Xarelto now.    -Currently not on anticoagulation    LOS: 4 days   Jacqlyn Larsen, Medical Student 07/22/2020, 12:20 PM  Pager: [phone] After 5pm on weekdays and 1pm on weekends: On Call pager: [phone]

## 2020-07-22 NOTE — TOC Initial Note (Signed)
Transition of Care [facility]) - Initial/Assessment Note    Patient Details  Name: [name] MRN: [id number] Date of Birth: 09-Jun-1924  Transition of Care [facility]) CM/SW Contact:    Tresa Endo Phone Number: 07/22/2020, 4:35 PM  Clinical Narrative:                 3:00pm- CSW faxed out pt to [facility] per daughter request, CSW contacted [facility] after accepting pt the hub. [facility] is able to take pt tomorrow if ready for DC CSW updated facility on insurance being [place], [place] will start auth on pt and follow up with CSW for auth completion.         Patient Goals and CMS Choice        Expected Discharge Plan and Services                                                Prior Living Arrangements/Services                       Activities of Daily Living Home Assistive Devices/Equipment: Gilford Rile (specify type) ADL Screening (condition at time of admission) Patient's cognitive ability adequate to safely complete daily activities?: No Is the patient deaf or have difficulty hearing?: Yes Does the patient have difficulty seeing, even when wearing glasses/contacts?: Yes Does the patient have difficulty concentrating, remembering, or making decisions?: Yes Patient able to express need for assistance with ADLs?: Yes Does the patient have difficulty dressing or bathing?: Yes Independently performs ADLs?: No Communication: Independent Dressing (OT): Needs assistance Is this a change from baseline?: Pre-admission baseline Grooming: Needs assistance Is this a change from baseline?: Pre-admission baseline Feeding: Independent Bathing: Needs assistance Is this a change from baseline?: Pre-admission baseline Toileting: Needs assistance Is this a change from baseline?: Pre-admission baseline In/Out Bed: Needs assistance Is this a change from baseline?: Pre-admission baseline Walks in Home: Needs assistance Is this a change from baseline?:  Pre-admission baseline Does the patient have difficulty walking or climbing stairs?: Yes Weakness of Legs: Both Weakness of Arms/Hands: None  Permission Sought/Granted                  Emotional Assessment              Admission diagnosis:  Acute lower GI bleeding [K92.2] Lower GI bleed [K92.2] Patient Active Problem List   Diagnosis Date Noted  . Diverticulosis of colon with hemorrhage 07/19/2020  . Acute lower GI bleeding 07/18/2020  . Hematochezia   . Acute blood loss anemia   . Thrombocytopenia ([place]) 03/30/2020  . Weakness 03/19/2020  . COVID-19 03/19/2020  . Partial symptomatic epilepsy with complex partial seizures, not intractable, without status epilepticus ([place]) 04/17/2019  . Chronic diastolic (congestive) heart failure ([place]) 10/28/2017  . Cerebellar ataxia in diseases classified elsewhere ([place]) 02/13/2017  . Vascular dementia ([place]) 01/28/2016  . Presence of IVC filter 07/17/2014  . CKD (chronic kidney disease) stage 3, GFR 30-59 ml/min (HCC) 06/23/2013  . Hyperlipidemia   . BPH (benign prostatic hyperplasia) 04/24/2013  . Hearing loss 07/20/2011  . Meningioma ([place]) 07/20/2011  . HTN (hypertension) 07/19/2011   PCP:  [name], DO Pharmacy:   [facility] [place], [address]  [place] [address] Phone: [phone] Fax: [phone]  [place], [place] [address], [address] [place], [address] [place] [address] Phone: [phone] Fax: [place], [place] - [place] - [address] [place] [address] Phone: [phone] Fax: [phone]     Social Determinants of Health ([place]) Interventions    Readmission Risk Interventions No flowsheet data found.

## 2020-07-22 NOTE — Care Management Important Message (Signed)
Important Message  Patient Details  Name: [name] MRN: [id number] Date of Birth: March 01, 1925   Medicare Important Message Given:  Yes     [name] 07/22/2020, 2:37 PM

## 2020-07-22 NOTE — NC FL2 (Signed)
[place] LEVEL OF CARE SCREENING TOOL     IDENTIFICATION  Patient Name: [name] Birthdate: 10-20-1924 Sex: male Admission Date (Current Location): 07/18/2020  [facility] and [place] Number:  Herbalist and Address:  [facility] [place], [place] [address], [place], [place] [address]      Provider Number: [id number]  Attending Physician Name and Address:  [name], DO  Relative Name and Phone Number:  Christia Reading [phone]    Current Level of Care: Hospital Recommended Level of Care: [place] Prior Approval Number:    Date Approved/Denied:   PASRR Number: [id number]  Discharge Plan: SNF    Current Diagnoses: Patient Active Problem List   Diagnosis Date Noted  . Diverticulosis of colon with hemorrhage 07/19/2020  . Acute lower GI bleeding 07/18/2020  . Hematochezia   . Acute blood loss anemia   . Thrombocytopenia ([employer]) 03/30/2020  . Weakness 03/19/2020  . COVID-19 03/19/2020  . Partial symptomatic epilepsy with complex partial seizures, not intractable, without status epilepticus ([place]) 04/17/2019  . Chronic diastolic (congestive) heart failure ([place]) 10/28/2017  . Cerebellar ataxia in diseases classified elsewhere ([place]) 02/13/2017  . Vascular dementia ([place]) 01/28/2016  . Presence of IVC filter 07/17/2014  . CKD (chronic kidney disease) stage 3, GFR 30-59 ml/min (HCC) 06/23/2013  . Hyperlipidemia   . BPH (benign prostatic hyperplasia) 04/24/2013  . Hearing loss 07/20/2011  . Meningioma ([place]) 07/20/2011  . HTN (hypertension) 07/19/2011    Orientation RESPIRATION BLADDER Height & Weight     Self,Time,Place  Normal Incontinent,External catheter Weight: 206 lb 5.6 oz (93.6 kg) Height:  5\' 7"  (170.2 cm)  BEHAVIORAL SYMPTOMS/MOOD NEUROLOGICAL BOWEL NUTRITION STATUS      Incontinent Diet (see dc summary)  AMBULATORY STATUS COMMUNICATION OF NEEDS Skin   Limited Assist Verbally Normal                        Personal Care Assistance Level of Assistance  Bathing,Feeding,Dressing Bathing Assistance: Limited assistance Feeding assistance: Independent Dressing Assistance: Limited assistance     Functional Limitations Info  Sight,Hearing,Speech Sight Info: Adequate Hearing Info: Impaired Speech Info: Adequate    SPECIAL CARE FACTORS FREQUENCY  PT (By licensed PT),OT (By licensed OT)     PT Frequency: 5x per week OT Frequency: 5x per week            Contractures Contractures Info: Not present    Additional Factors Info  Code Status,Allergies Code Status Info: DNR Allergies Info: NKA           Current Medications (07/22/2020):  This is the current hospital active medication list Current Facility-Administered Medications  Medication Dose Route Frequency Provider Last Rate Last Admin  . acetaminophen (TYLENOL) tablet 650 mg  650 mg Oral Q6H PRN Maudie Mercury, MD       Or  . acetaminophen (TYLENOL) suppository 650 mg  650 mg Rectal Q6H PRN Maudie Mercury, MD      . amLODipine (NORVASC) tablet 2.5 mg  2.5 mg Oral Daily Jean Rosenthal, MD   2.5 mg at 07/22/20 0921  . atorvastatin (LIPITOR) tablet 20 mg  20 mg Oral Daily Agyei, Obed K, MD   20 mg at 07/22/20 4008  . multivitamin with minerals tablet 1 tablet  1 tablet Oral Daily [name], DO   1 tablet at 07/22/20 6761     Discharge Medications: Please see discharge summary for a list of  discharge medications.  Relevant Imaging Results:  Relevant Lab Results:   Additional Information SSN [id number]  Loreta Ave, LCSWA

## 2020-07-22 NOTE — Plan of Care (Signed)

## 2020-07-23 LAB — CBC
HCT: 26.1 % — ABNORMAL LOW (ref 39.0–52.0)
Hemoglobin: 8.3 g/dL — ABNORMAL LOW (ref 13.0–17.0)
MCH: 30.2 pg (ref 26.0–34.0)
MCHC: 31.8 g/dL (ref 30.0–36.0)
MCV: 94.9 fL (ref 80.0–100.0)
Platelets: 129 10*3/uL — ABNORMAL LOW (ref 150–400)
RBC: 2.75 MIL/uL — ABNORMAL LOW (ref 4.22–5.81)
RDW: 14.4 % (ref 11.5–15.5)
WBC: 10.5 10*3/uL (ref 4.0–10.5)
nRBC: 0 % (ref 0.0–0.2)

## 2020-07-23 MED ORDER — ADULT MULTIVITAMIN W/MINERALS CH
1.0000 | ORAL_TABLET | Freq: Every day | ORAL | 0 refills | Status: DC
Start: 2020-07-24 — End: 2020-10-14

## 2020-07-23 MED ORDER — AMLODIPINE BESYLATE 5 MG PO TABS: 5.0000 mg | ORAL_TABLET | Freq: Every day | ORAL | 0 refills | Status: DC

## 2020-07-23 MED ORDER — TRIAMCINOLONE ACETONIDE 0.1 % EX OINT: 1.0000 "application " | TOPICAL_OINTMENT | Freq: Two times a day (BID) | CUTANEOUS | 0 refills | Status: DC | PRN

## 2020-07-23 MED ORDER — FUROSEMIDE 40 MG PO TABS
40.0000 mg | ORAL_TABLET | Freq: Every day | ORAL | 0 refills | Status: DC | PRN
Start: 2020-07-23 — End: 2021-07-21

## 2020-07-23 MED ORDER — ATORVASTATIN CALCIUM 20 MG PO TABS: 20.0000 mg | ORAL_TABLET | Freq: Every day | ORAL | 0 refills | Status: DC

## 2020-07-23 MED ORDER — ASPIRIN 81 MG PO TBEC: 81.0000 mg | DELAYED_RELEASE_TABLET | Freq: Every day | ORAL | 0 refills | Status: DC

## 2020-07-23 NOTE — Progress Notes (Signed)
Physical Therapy Treatment Patient Details Name: [name] MRN: [id number] DOB: 04-17-1924 Today's Date: 07/23/2020    History of Present Illness Pt is 87 yo male presented on 07/18/20 with large bright red blood per rectum - admitted with lower GI bleed.  GI consulted and reports suspected diverticular bleed, no colonscopy due to age, comorbidities.  PMH includes vascular dementia, CVA, meningioma, HTN, CHF, and hx of GIB.    PT Comments    Pt demonstrates ability to perform bed mobility and transfers requiring physical assistance for completion. Pt experiences 1 LOB during stand>pivot transfer, requiring SPT assist to safely descend to chair. Pt acknowledges his ability to mobilize without physical assistance prior to admission, also recognizes he is weaker currently. Pt demonstrates deficits in overall strength, endurance, power, and activity tolerance and will benefit from acute PT to improve safety and overall functional mobility.    Follow Up Recommendations  SNF     Equipment Recommendations  None recommended by PT    Recommendations for Other Services       Precautions / Restrictions Precautions Precautions: Fall Restrictions Weight Bearing Restrictions: No    Mobility  Bed Mobility Overal bed mobility: Needs Assistance Bed Mobility: Supine to Sit     Supine to sit: Mod assist;HOB elevated Sit to supine: Mod assist   General bed mobility comments: VCs to reach L UE over to R and to bring LEs to EOB. Mod A to bring LEs over and trunk upright.    Transfers Overall transfer level: Needs assistance Equipment used: Rolling walker (2 wheeled) Transfers: Sit to/from [name] Sit to Stand: From elevated surface;Mod assist;Min assist (min A from elevated surface, mod A with bed at lowest setting.) Stand pivot transfers: Mod assist       General transfer comment: Pt avoids weightbearing through R heel, primarily weightbearing through forefoot. Pt  experiences LOB with stand>sit, requiring SPT assist to safely descend to recliner. Pt's safety awareness is questionable as he attempts to sit prior to ensuring recliner is within reach.  Ambulation/Gait                 Stairs             Wheelchair Mobility    Modified Rankin (Stroke Patients Only)       Balance Overall balance assessment: Needs assistance Sitting-balance support: No upper extremity supported;Bilateral upper extremity supported;Feet supported Sitting balance-Leahy Scale: Fair     Standing balance support: Bilateral upper extremity supported;During functional activity Standing balance-Leahy Scale: Poor Standing balance comment: Pt requires UE support with static and dynamic standing activities.                            Cognition Arousal/Alertness: Awake/alert Behavior During Therapy: WFL for tasks assessed/performed Overall Cognitive Status: History of cognitive impairments - at baseline                                 General Comments: Pt follows one step commands consistently.      Exercises      General Comments General comments (skin integrity, edema, etc.): VSS on RA.      Pertinent Vitals/Pain Pain Assessment: No/denies pain    Home Living                      Prior Function  PT Goals (current goals can now be found in the care plan section) Acute Rehab PT Goals Patient Stated Goal: To get stronger and go home Progress towards PT goals: Progressing toward goals    Frequency    Min 2X/week      PT Plan Current plan remains appropriate    Co-evaluation              AM-PAC PT "6 Clicks" Mobility   Outcome Measure  Help needed turning from your back to your side while in a flat bed without using bedrails?: A Little Help needed moving from lying on your back to sitting on the side of a flat bed without using bedrails?: A Lot Help needed moving to and from a bed  to a chair (including a wheelchair)?: A Lot Help needed standing up from a chair using your arms (e.g., wheelchair or bedside chair)?: A Lot Help needed to walk in hospital room?: A Lot Help needed climbing 3-5 steps with a railing? : Total 6 Click Score: 12    End of Session Equipment Utilized During Treatment: Gait belt Activity Tolerance: Patient tolerated treatment well Patient left: in bed;with call bell/phone within reach;with bed alarm set;with SCD's reapplied Nurse Communication: Mobility status PT Visit Diagnosis: Unsteadiness on feet (R26.81);Muscle weakness (generalized) (M62.81);Other abnormalities of gait and mobility (R26.89);Difficulty in walking, not elsewhere classified (R26.2)     Time: 2725-3664 PT Time Calculation (min) (ACUTE ONLY): 40 min  Charges:  $Therapeutic Activity: 38-52 mins                     Acute Rehab  Pager: [phone]    Garwin Brothers, SPT  07/23/2020, 4:29 PM

## 2020-07-23 NOTE — Plan of Care (Signed)

## 2020-07-23 NOTE — Discharge Instructions (Signed)
Hello [name], it was a pleasure caring for you in the hospital. You were admitted to the hospital for bleeding from your colon due to diverticulosis. Diverticulosis is when there are small outpouchings in the wall of the colon and sometimes they can cause bleeding. The bleeding you had resolved without a procedure.   You will be going to a skilled nursing facility [facility]) to recover. Here are some instructions for when you come home from the nursing facility:   1. Follow up your primary care doctor (information is included).  2. Continue your medications as listed in the discharge summary  3. Increase fiber in your diet (lots of fruits and vegetables) and stay hydrated by drinking water  Joni Reining C, DO Follow up.   Specialty: Internal Medicine Contact information: [place] [address] [phone]  Below is more detailed information on GI bleeding:  Gastrointestinal Bleeding Gastrointestinal (GI) bleeding is bleeding somewhere along the path that food travels through the body (digestive tract). This path is anywhere between the mouth and the opening of the butt (anus). You may have blood in your poop (stool) or have black poop. If you throw up (vomit), there may be blood in it. This condition can be mild, serious, or even life-threatening. If you have a lot of bleeding, you may need to stay in the hospital. What are the causes? This condition may be caused by:  Irritation and swelling of the esophagus (esophagitis). The esophagus is part of the body that moves food from your mouth to your stomach.  Swollen veins in the butt (hemorrhoids).  Areas of painful tearing in the opening of the butt (anal fissures). These are often caused by passing hard poop.  Pouches that form on the colon over time (diverticulosis).  Irritation and swelling (diverticulitis) in areas where pouches have formed on the colon.  Growths (polyps) or cancer. Colon cancer often  starts out as growths that are not cancer.  Irritation of the stomach lining (gastritis).  Sores (ulcers) in the stomach. What increases the risk? You are more likely to develop this condition if you:  Have a certain type of infection in your stomach (Helicobacter pylori infection).  Take certain medicines.  Smoke.  Drink alcohol. What are the signs or symptoms? Common symptoms of this condition include:  Throwing up (vomiting) material that has bright red blood in it. It may look like coffee grounds.  Changes in your poop. The poop may: ? Have red blood in it. ? Be black, look like tar, and smell stronger than normal. ? Be red.  Pain or cramping in the belly (abdomen). How is this treated? Treatment for this condition depends on the cause of the bleeding. For example:  Sometimes, the bleeding can be stopped during a procedure that is done to find the problem (endoscopy or colonoscopy).  Medicines can be used to: ? Help control irritation, swelling, or infection. ? Reduce acid in your stomach.  Certain problems can be treated with: ? Creams. ? Medicines that are put in the butt (suppositories). ? Warm baths.  Surgery is sometimes needed.  If you lose a lot of blood, you may need a blood transfusion. If bleeding is mild, you may be allowed to go home. If there is a lot of bleeding, you will need to stay in the hospital. Follow these instructions at home:  Take over-the-counter and prescription medicines only as told by your doctor.  Eat foods that have a lot  of fiber in them. These foods include beans, whole grains, and fresh fruits and vegetables. You can also try eating 1-3 prunes each day.  Drink enough fluid to keep your pee (urine) pale yellow.  Keep all follow-up visits as told by your doctor. This is important.   Contact a doctor if:  Your symptoms do not get better. Get help right away if:  Your bleeding does not stop.  You feel dizzy or you pass out  (faint).  You feel weak.  You have very bad cramps in your back or belly.  You pass large clumps of blood (clots) in your poop.  Your symptoms are getting worse.  You have chest pain or fast heartbeats. Summary  GI bleeding is bleeding somewhere along the path that food travels through the body (digestive tract).  This bleeding can be caused by many things. Treatment depends on the cause of the bleeding.  Take medicines only as told by your doctor.  Keep all follow-up visits as told by your doctor. This is important. This information is not intended to replace advice given to you by your health care provider. Make sure you discuss any questions you have with your health care provider. Document Revised: 10/10/2017 Document Reviewed: 10/10/2017 Elsevier Patient Education  [place].

## 2020-07-23 NOTE — Progress Notes (Signed)
   07/23/20 1228  Mobility  Activity Refused mobility (Declined despite max encouragement)   Patient just received lunch, offered to help pt OOB to chair for lunch. Declined for unspecified reasons; was asleep upon entry

## 2020-07-23 NOTE — TOC Transition Note (Signed)
Transition of Care [facility]) - CM/SW Discharge Note   Patient Details  Name: [name] MRN: [id number] Date of Birth: [birth year]  Transition [facility] Care [facility]) CM/SW Contact:  Tresa Endo Phone Number: 07/23/2020, 2:46 PM   Clinical Narrative:    Patient will DC to: [facility] Anticipated DC date: 07/23/2020 Family notified: Pt Daughter Transport by: Corey Harold   Per MD patient ready for DC to [facility] room 605p. RN to call report prior to discharge [phone]). RN, patient, patient's family, and facility notified of DC. Discharge Summary and FL2 sent to facility. DC packet on chart. Ambulance transport requested for patient.   CSW will sign off for now as social work intervention is no longer needed. Please consult [place] again if new needs arise.      Final next level of care: Skilled Nursing Facility Barriers to Discharge: Continued Medical Work up   Patient Goals and CMS Choice Patient states their goals for this hospitalization and ongoing recovery are:: Rehab CMS Medicare.gov Compare Post Acute Care list provided to:: Patient Choice offered to / list presented to : Adult Children  Discharge Placement                       Discharge Plan and Services In-house Referral: Clinical Social Work                                   Social Determinants of Health (SDOH) Interventions     Readmission Risk Interventions No flowsheet data found.

## 2020-07-23 NOTE — Discharge Summary (Addendum)
Name: [name] MRN: [id number] DOB: Dec 27, 1924 87 y.o. PCP: [name], DO  Date of Admission: 07/18/2020 12:42 PM Date of Discharge: 07/23/20 Attending Physician: [name], DO  Discharge Diagnosis: 1. GI bleed due to diverticular hemorrhage   Discharge Medications: Allergies as of 07/23/2020   No Known Allergies      Medication List     STOP taking these medications    levETIRAcetam 500 MG 24 hr tablet Commonly known as: KEPPRA XR       TAKE these medications    acetaminophen 500 MG tablet Commonly known as: TYLENOL Take 1 tablet (500 mg total) by mouth every 6 (six) hours as needed for fever.   amLODipine 5 MG tablet Commonly known as: NORVASC Take 1 tablet (5 mg total) by mouth daily.   aspirin 81 MG EC tablet Take 1 tablet (81 mg total) by mouth daily.   atorvastatin 20 MG tablet Commonly known as: LIPITOR Take 1 tablet (20 mg total) by mouth daily.   furosemide 40 MG tablet Commonly known as: LASIX Take 1 tablet (40 mg total) by mouth daily as needed for fluid. What changed: how much to take   multivitamin with minerals Tabs tablet Take 1 tablet by mouth daily. Start taking on: Jul 24, 2020   triamcinolone ointment 0.1 % Commonly known as: KENALOG Apply 1 application topically 2 (two) times daily as needed (For leg rash).       Disposition and follow-up:   [name] is a 87 y.o. male with past medical history of HTN, CHF, CKD stage 3a, CVA, vascular dementia, DVT not on anticoagulation, and diverticular GI bleed who presented with large bright red blood per rectum for further work up of lower GI bleed 2/2 diverticular hemorrhage that self-resolved without intervention.   JimmyLamonta D Kuenzel was discharged from [facility] in Stable condition.   Discharge to skilled nursing facility [facility]). Please follow up:    [ ]  Medications to continue: amlodipine 5 mg daily, atorvastatin 40 mg daily  [ ]   Medications to restart: ASA 81 mg daily, Lasix 40 mg once daily   No labs/imaging pending or needed needed at time of follow-up.   Follow up with PCP after discharge from skilled nursing facility:   Follow-up Information     [name], DO Follow up.   Specialty: Internal Medicine Contact information: [place] [address] [place] Hospital Course by problem list:  #Large volume bright red blood per rectum  #Lower GI bleed 2/2 diverticular hemorrhage  Patient presents with large volume bright red blood per rectum likely secondary to lower GI bleed. Has history of a diverticular GI bleed on Xarelto in 08/2018 that resolved without intervention and was seen by [facility]. Upon presentation was hemodynamically stable and not on Xarelto. Hgb was 10.9 and baseline ~13. Received IV fluids via 2 large bore IVs and 2 units pRBCS. [facility] consulted and CT angiogram of abdomen and pelvis showed no active bleed or signs of bleeding, diverticulosis, and thickening of distal bowel most likely c/w proctitis. Patient had two more bloody bowel movements on 5/9 morning and was asymptomatic at this time and hgb stable at 10.8. Transitioned from NPO to CLD to low sodium diet, which he tolerated well. Hgb remained stable between 8-9 during hospitalization. Had black tarry stool on afternoon of  5/11, asymptomatic at the time and vitals within normal limits. No intervention was needed for diverticular hemorrhage, bleeding resolved with medical management. Physical therapy recommended SNF after admission.   #Hypertension  Normotensive throughout admission on 2.5 mg of amlodipine. Had one episode of asymptomatic hypertension overnight on 5/11 and given additional amlodipine 2.5 mg. Increased to amlodipine 5 mg on 5/12 for remainder of admission.    #CKD Stage 3a Baseline Cr around 1.1-1.13. On admission Cr 1.34 and stabilized at 1.16 on 5/11.    #Heart failure with  preserved ejection fraction  EF 60-65% as of 09/2018 echo and taking PO Lasix 40 mg BID at home. Held Lasix 40 mg BID given large volume stools and risk of hypotension due to bleeding. On 5/12, developed 1+ pitting edema, otherwise asymptomatic. Decided to continue holding lasix given recent bleeding and risk of hypotension with older age and frailty.    #History of cerebellar CVA  Home regimen includes atorvastatin 20 mg daily and ASA 81 mg daily.  Held ASA 81 mg and resumed atorvastatin 20 mg daily on 5/9.    #History of DVT  Provoked after TURP and diagnosed per doppler 06/10/2013. Complicated by hematuria while on anticoagulation s/p TURP. Initially on lovenox and coumadin, then stopped. Then IVC filter placed 06/16/13. Anticoagulation restarted after hematuria stopped. Lovenox was discontinued on 07/29/2013 with comment on high risk for falls. IVC still in place in 2016. Xarelto also started in 2016 given history of CVA and DVT and stopped in 2020 after GI bleed.   Discharge Exam:   BP 128/65 (BP Location: Right Wrist)   Pulse 79   Temp 98.2 F (36.8 C) (Oral)   Resp 18   Ht 5\' 7"  (1.702 m)   Wt 93.7 kg   SpO2 94%   BMI 32.35 kg/m   Physical Exam Constitutional:      Comments: Sleeping when entered room and patient woke up and was alert and talking with team comfortably  Eyes:     Conjunctiva/sclera: Conjunctivae normal.  Cardiovascular:     Rate and Rhythm: Normal rate and regular rhythm.     Heart sounds: Normal heart sounds.  Pulmonary:     Effort: Pulmonary effort is normal.  Abdominal:     General: Abdomen is flat. Bowel sounds are normal.     Palpations: Abdomen is soft.  Skin:    General: Skin is warm and dry.  Neurological:     General: No focal deficit present.     Mental Status: Mental status is at baseline.  Psychiatric:        Mood and Affect: Mood normal.        Behavior: Behavior normal.    Pertinent Labs, Studies, and Procedures:  CBC Latest Ref Rng &  Units 07/23/2020 07/22/2020 07/21/2020  WBC 4.0 - 10.5 K/uL 10.5 16.3(H) 8.4  Hemoglobin 13.0 - 17.0 g/dL 8.3(L) 8.6(L) 8.5(L)  Hematocrit 39.0 - 52.0 % 26.1(L) 27.3(L) 27.5(L)  Platelets 150 - 400 K/uL 129(L) 123(L) 123(L)   CMP Latest Ref Rng & Units 07/21/2020 07/20/2020 07/19/2020  Glucose 70 - 99 mg/dL 105(H) 104(H) 128(H)  BUN 8 - 23 mg/dL 21 31(H) 26(H)  Creatinine 0.61 - 1.24 mg/dL 1.16 1.18 1.29(H)  Sodium 135 - 145 mmol/L 140 140 142  Potassium 3.5 - 5.1 mmol/L 3.9 3.9 4.3  Chloride 98 - 111 mmol/L 113(H) 110 113(H)  CO2 22 - 32 mmol/L 21(L) 24 25  Calcium 8.9 - 10.3 mg/dL 9.3 9.3 9.5  Total  Protein 6.5 - 8.1 g/dL 4.9(L) 4.8(L) 5.5(L)  Total Bilirubin 0.3 - 1.2 mg/dL 0.4 0.4 1.4(H)  Alkaline Phos 38 - 126 U/L 35(L) 35(L) 40  AST 15 - 41 U/L 13(L) 13(L) 14(L)  ALT 0 - 44 U/L 10 10 9    EXAM: CTA ABDOMEN AND PELVIS WITHOUT AND WITH CONTRAST   TECHNIQUE: Multidetector CT imaging of the abdomen and pelvis was performed using the standard protocol during bolus administration of intravenous contrast. Multiplanar reconstructed images and MIPs were obtained and reviewed to evaluate the vascular anatomy.   CONTRAST:  116mL OMNIPAQUE IOHEXOL 350 MG/ML SOLN   COMPARISON:  02/21/2013, 09/15/2013   FINDINGS: VASCULAR   Aorta: Initial precontrast images demonstrate atherosclerotic calcifications without aneurysmal dilatation. Post-contrast images demonstrate no evidence of dissection or aneurysmal dilatation.   Celiac: Patent without evidence of aneurysm, dissection, vasculitis or significant stenosis.   SMA: Patent without evidence of aneurysm, dissection, vasculitis or significant stenosis.   Renals: Mild atherosclerotic changes are noted. Single renal arteries are identified bilaterally.   IMA: Patent without evidence of aneurysm, dissection, vasculitis or significant stenosis.   Iliacs: Patent without evidence of aneurysm, dissection, vasculitis or significant  stenosis.   Veins: No specific venous abnormality is noted. IVC filter is noted in place.   Review of the MIP images confirms the above findings.   NON-VASCULAR   Lower chest: No acute abnormality.   Hepatobiliary: Multiple cysts are noted throughout the liver. Gallbladder is well distended with multiple dependent gallstones. No biliary ductal dilatation is seen.   Pancreas: Unremarkable. No pancreatic ductal dilatation or surrounding inflammatory changes.   Spleen: Normal in size without focal abnormality.   Adrenals/Urinary Tract: Adrenal glands demonstrate stable left adrenal adenoma unchanged from the prior exam. Kidneys demonstrate multiple renal cysts. No renal calculi or obstructive changes are seen. The bladder is well distended.   Stomach/Bowel: Diffuse wall thickening is noted within the distal rectum which may represent some focal proctitis. No abscess or perforation is noted. Diverticulosis without evidence of diverticulitis is noted. No obstructing or inflammatory lesions are seen in the colon. The appendix is within normal limits. No pooling of contrast is identified to suggest active arterial hemorrhage. Small bowel and stomach appear within normal limits.   Lymphatic: No significant lymphadenopathy is noted.   Reproductive: Prostate is unremarkable.   Other: No abdominal wall hernia or abnormality. No abdominopelvic ascites.   Musculoskeletal: Degenerative changes of the hip joints and lumbar spine are noted.   IMPRESSION: VASCULAR   Atherosclerotic changes are noted without aneurysmal dilatation or dissection.   No areas of pooling of contrast to suggest active GI hemorrhage.   NON-VASCULAR   Stable hepatic and renal cysts.   Stable left adrenal adenoma.   Mild wall thickening in the distal rectum which may represent a degree of proctitis. Again no active GI hemorrhage is noted.  Discharge Instructions: Hello Mr. Yakubov, it was a pleasure  caring for you in the hospital. You were admitted to the hospital for bleeding from your colon due to diverticulosis. Diverticulosis is when there are small outpouchings in the wall of the colon and sometimes they can cause bleeding. The bleeding you had resolved without a procedure.   You will be going to a skilled nursing facility [facility]) to recover. Here are some instructions for when you come home from the nursing facility:   1. Follow up your primary care doctor (information is included).  2. Continue your medications as listed in the discharge summary  3. Increase fiber in your diet (lots of fruits and vegetables) and stay hydrated by drinking water   Signed: Jacqlyn Larsen, Medical Student 07/23/2020, 7:19 AM    Attestation for Student Documentation:  I personally was present and performed or re-performed the history, physical exam and medical decision-making activities of this service and have verified that the service and findings are accurately documented in the student's note.  Maudie Mercury, MD 07/23/2020, 11:20 AM

## 2020-07-24 DIAGNOSIS — R531 Weakness: Secondary | ICD-10-CM | POA: Diagnosis not present

## 2020-07-24 DIAGNOSIS — R41841 Cognitive communication deficit: Secondary | ICD-10-CM | POA: Diagnosis not present

## 2020-07-24 DIAGNOSIS — I509 Heart failure, unspecified: Secondary | ICD-10-CM | POA: Diagnosis not present

## 2020-07-24 DIAGNOSIS — D500 Iron deficiency anemia secondary to blood loss (chronic): Secondary | ICD-10-CM | POA: Diagnosis not present

## 2020-07-24 DIAGNOSIS — D6859 Other primary thrombophilia: Secondary | ICD-10-CM | POA: Diagnosis not present

## 2020-07-24 DIAGNOSIS — R0902 Hypoxemia: Secondary | ICD-10-CM | POA: Diagnosis not present

## 2020-07-24 DIAGNOSIS — H9190 Unspecified hearing loss, unspecified ear: Secondary | ICD-10-CM | POA: Diagnosis not present

## 2020-07-24 DIAGNOSIS — M6281 Muscle weakness (generalized): Secondary | ICD-10-CM | POA: Diagnosis not present

## 2020-07-24 DIAGNOSIS — I10 Essential (primary) hypertension: Secondary | ICD-10-CM | POA: Diagnosis not present

## 2020-07-24 DIAGNOSIS — F0150 Vascular dementia without behavioral disturbance: Secondary | ICD-10-CM | POA: Diagnosis not present

## 2020-07-24 DIAGNOSIS — R2689 Other abnormalities of gait and mobility: Secondary | ICD-10-CM | POA: Diagnosis not present

## 2020-07-24 DIAGNOSIS — R2681 Unsteadiness on feet: Secondary | ICD-10-CM | POA: Diagnosis not present

## 2020-07-24 DIAGNOSIS — R1312 Dysphagia, oropharyngeal phase: Secondary | ICD-10-CM | POA: Diagnosis not present

## 2020-07-24 DIAGNOSIS — H9193 Unspecified hearing loss, bilateral: Secondary | ICD-10-CM | POA: Diagnosis not present

## 2020-07-24 DIAGNOSIS — M2550 Pain in unspecified joint: Secondary | ICD-10-CM | POA: Diagnosis not present

## 2020-07-24 DIAGNOSIS — K5731 Diverticulosis of large intestine without perforation or abscess with bleeding: Secondary | ICD-10-CM | POA: Diagnosis not present

## 2020-07-24 DIAGNOSIS — Z9181 History of falling: Secondary | ICD-10-CM | POA: Diagnosis not present

## 2020-07-24 DIAGNOSIS — Z8673 Personal history of transient ischemic attack (TIA), and cerebral infarction without residual deficits: Secondary | ICD-10-CM | POA: Diagnosis not present

## 2020-07-24 DIAGNOSIS — D62 Acute posthemorrhagic anemia: Secondary | ICD-10-CM | POA: Diagnosis not present

## 2020-07-24 DIAGNOSIS — N183 Chronic kidney disease, stage 3 unspecified: Secondary | ICD-10-CM | POA: Diagnosis not present

## 2020-07-24 DIAGNOSIS — Z8719 Personal history of other diseases of the digestive system: Secondary | ICD-10-CM | POA: Diagnosis not present

## 2020-07-24 DIAGNOSIS — I5032 Chronic diastolic (congestive) heart failure: Secondary | ICD-10-CM | POA: Diagnosis not present

## 2020-07-24 DIAGNOSIS — K922 Gastrointestinal hemorrhage, unspecified: Secondary | ICD-10-CM | POA: Diagnosis not present

## 2020-07-24 DIAGNOSIS — L89622 Pressure ulcer of left heel, stage 2: Secondary | ICD-10-CM | POA: Diagnosis not present

## 2020-07-24 DIAGNOSIS — R262 Difficulty in walking, not elsewhere classified: Secondary | ICD-10-CM | POA: Diagnosis not present

## 2020-07-24 DIAGNOSIS — I679 Cerebrovascular disease, unspecified: Secondary | ICD-10-CM | POA: Diagnosis not present

## 2020-07-24 DIAGNOSIS — N401 Enlarged prostate with lower urinary tract symptoms: Secondary | ICD-10-CM | POA: Diagnosis not present

## 2020-07-24 DIAGNOSIS — D649 Anemia, unspecified: Secondary | ICD-10-CM | POA: Diagnosis not present

## 2020-07-24 DIAGNOSIS — Z7401 Bed confinement status: Secondary | ICD-10-CM | POA: Diagnosis not present

## 2020-07-24 DIAGNOSIS — G4089 Other seizures: Secondary | ICD-10-CM | POA: Diagnosis not present

## 2020-07-24 DIAGNOSIS — U071 COVID-19: Secondary | ICD-10-CM | POA: Diagnosis not present

## 2020-07-24 DIAGNOSIS — R58 Hemorrhage, not elsewhere classified: Secondary | ICD-10-CM | POA: Diagnosis not present

## 2020-07-26 DIAGNOSIS — N183 Chronic kidney disease, stage 3 unspecified: Secondary | ICD-10-CM | POA: Diagnosis not present

## 2020-07-26 DIAGNOSIS — Z8719 Personal history of other diseases of the digestive system: Secondary | ICD-10-CM | POA: Diagnosis not present

## 2020-07-26 DIAGNOSIS — M6281 Muscle weakness (generalized): Secondary | ICD-10-CM | POA: Diagnosis not present

## 2020-07-26 DIAGNOSIS — F0150 Vascular dementia without behavioral disturbance: Secondary | ICD-10-CM | POA: Diagnosis not present

## 2020-07-26 DIAGNOSIS — I509 Heart failure, unspecified: Secondary | ICD-10-CM | POA: Diagnosis not present

## 2020-07-26 DIAGNOSIS — D62 Acute posthemorrhagic anemia: Secondary | ICD-10-CM | POA: Diagnosis not present

## 2020-07-26 DIAGNOSIS — I10 Essential (primary) hypertension: Secondary | ICD-10-CM | POA: Diagnosis not present

## 2020-07-26 DIAGNOSIS — N401 Enlarged prostate with lower urinary tract symptoms: Secondary | ICD-10-CM | POA: Diagnosis not present

## 2020-07-26 DIAGNOSIS — I5032 Chronic diastolic (congestive) heart failure: Secondary | ICD-10-CM | POA: Diagnosis not present

## 2020-07-26 DIAGNOSIS — H9190 Unspecified hearing loss, unspecified ear: Secondary | ICD-10-CM | POA: Diagnosis not present

## 2020-07-26 DIAGNOSIS — D649 Anemia, unspecified: Secondary | ICD-10-CM | POA: Diagnosis not present

## 2020-07-26 DIAGNOSIS — R262 Difficulty in walking, not elsewhere classified: Secondary | ICD-10-CM | POA: Diagnosis not present

## 2020-07-26 DIAGNOSIS — Z8673 Personal history of transient ischemic attack (TIA), and cerebral infarction without residual deficits: Secondary | ICD-10-CM | POA: Diagnosis not present

## 2020-07-26 DIAGNOSIS — R2681 Unsteadiness on feet: Secondary | ICD-10-CM | POA: Diagnosis not present

## 2020-07-26 DIAGNOSIS — H9193 Unspecified hearing loss, bilateral: Secondary | ICD-10-CM | POA: Diagnosis not present

## 2020-07-27 DIAGNOSIS — I679 Cerebrovascular disease, unspecified: Secondary | ICD-10-CM | POA: Diagnosis not present

## 2020-07-27 DIAGNOSIS — K5731 Diverticulosis of large intestine without perforation or abscess with bleeding: Secondary | ICD-10-CM | POA: Diagnosis not present

## 2020-07-27 DIAGNOSIS — D649 Anemia, unspecified: Secondary | ICD-10-CM | POA: Diagnosis not present

## 2020-07-27 DIAGNOSIS — D6859 Other primary thrombophilia: Secondary | ICD-10-CM | POA: Diagnosis not present

## 2020-07-30 DIAGNOSIS — N401 Enlarged prostate with lower urinary tract symptoms: Secondary | ICD-10-CM | POA: Diagnosis not present

## 2020-07-30 DIAGNOSIS — Z8673 Personal history of transient ischemic attack (TIA), and cerebral infarction without residual deficits: Secondary | ICD-10-CM | POA: Diagnosis not present

## 2020-07-30 DIAGNOSIS — N183 Chronic kidney disease, stage 3 unspecified: Secondary | ICD-10-CM | POA: Diagnosis not present

## 2020-07-30 DIAGNOSIS — R2681 Unsteadiness on feet: Secondary | ICD-10-CM | POA: Diagnosis not present

## 2020-07-30 DIAGNOSIS — H9193 Unspecified hearing loss, bilateral: Secondary | ICD-10-CM | POA: Diagnosis not present

## 2020-07-30 DIAGNOSIS — M6281 Muscle weakness (generalized): Secondary | ICD-10-CM | POA: Diagnosis not present

## 2020-07-30 DIAGNOSIS — D62 Acute posthemorrhagic anemia: Secondary | ICD-10-CM | POA: Diagnosis not present

## 2020-07-30 DIAGNOSIS — I10 Essential (primary) hypertension: Secondary | ICD-10-CM | POA: Diagnosis not present

## 2020-07-30 DIAGNOSIS — I509 Heart failure, unspecified: Secondary | ICD-10-CM | POA: Diagnosis not present

## 2020-08-02 DIAGNOSIS — N401 Enlarged prostate with lower urinary tract symptoms: Secondary | ICD-10-CM | POA: Diagnosis not present

## 2020-08-02 DIAGNOSIS — M6281 Muscle weakness (generalized): Secondary | ICD-10-CM | POA: Diagnosis not present

## 2020-08-02 DIAGNOSIS — I509 Heart failure, unspecified: Secondary | ICD-10-CM | POA: Diagnosis not present

## 2020-08-02 DIAGNOSIS — R2681 Unsteadiness on feet: Secondary | ICD-10-CM | POA: Diagnosis not present

## 2020-08-02 DIAGNOSIS — D62 Acute posthemorrhagic anemia: Secondary | ICD-10-CM | POA: Diagnosis not present

## 2020-08-02 DIAGNOSIS — I10 Essential (primary) hypertension: Secondary | ICD-10-CM | POA: Diagnosis not present

## 2020-08-02 DIAGNOSIS — Z8673 Personal history of transient ischemic attack (TIA), and cerebral infarction without residual deficits: Secondary | ICD-10-CM | POA: Diagnosis not present

## 2020-08-02 DIAGNOSIS — H9193 Unspecified hearing loss, bilateral: Secondary | ICD-10-CM | POA: Diagnosis not present

## 2020-08-02 DIAGNOSIS — N183 Chronic kidney disease, stage 3 unspecified: Secondary | ICD-10-CM | POA: Diagnosis not present

## 2020-08-09 DIAGNOSIS — I10 Essential (primary) hypertension: Secondary | ICD-10-CM | POA: Diagnosis not present

## 2020-08-09 DIAGNOSIS — D62 Acute posthemorrhagic anemia: Secondary | ICD-10-CM | POA: Diagnosis not present

## 2020-08-09 DIAGNOSIS — R2681 Unsteadiness on feet: Secondary | ICD-10-CM | POA: Diagnosis not present

## 2020-08-09 DIAGNOSIS — N183 Chronic kidney disease, stage 3 unspecified: Secondary | ICD-10-CM | POA: Diagnosis not present

## 2020-08-09 DIAGNOSIS — M6281 Muscle weakness (generalized): Secondary | ICD-10-CM | POA: Diagnosis not present

## 2020-08-09 DIAGNOSIS — Z8673 Personal history of transient ischemic attack (TIA), and cerebral infarction without residual deficits: Secondary | ICD-10-CM | POA: Diagnosis not present

## 2020-08-09 DIAGNOSIS — N401 Enlarged prostate with lower urinary tract symptoms: Secondary | ICD-10-CM | POA: Diagnosis not present

## 2020-08-09 DIAGNOSIS — I509 Heart failure, unspecified: Secondary | ICD-10-CM | POA: Diagnosis not present

## 2020-08-09 DIAGNOSIS — H9193 Unspecified hearing loss, bilateral: Secondary | ICD-10-CM | POA: Diagnosis not present

## 2020-08-10 DIAGNOSIS — D62 Acute posthemorrhagic anemia: Secondary | ICD-10-CM | POA: Diagnosis not present

## 2020-08-10 DIAGNOSIS — Z8719 Personal history of other diseases of the digestive system: Secondary | ICD-10-CM | POA: Diagnosis not present

## 2020-08-11 DIAGNOSIS — H9190 Unspecified hearing loss, unspecified ear: Secondary | ICD-10-CM | POA: Diagnosis not present

## 2020-08-11 DIAGNOSIS — I714 Abdominal aortic aneurysm, without rupture: Secondary | ICD-10-CM | POA: Diagnosis not present

## 2020-08-11 DIAGNOSIS — I5032 Chronic diastolic (congestive) heart failure: Secondary | ICD-10-CM | POA: Diagnosis not present

## 2020-08-11 DIAGNOSIS — Z8719 Personal history of other diseases of the digestive system: Secondary | ICD-10-CM | POA: Diagnosis not present

## 2020-08-11 DIAGNOSIS — I6789 Other cerebrovascular disease: Secondary | ICD-10-CM | POA: Diagnosis not present

## 2020-08-11 DIAGNOSIS — D62 Acute posthemorrhagic anemia: Secondary | ICD-10-CM | POA: Diagnosis not present

## 2020-08-11 DIAGNOSIS — I10 Essential (primary) hypertension: Secondary | ICD-10-CM | POA: Diagnosis not present

## 2020-08-11 DIAGNOSIS — F0150 Vascular dementia without behavioral disturbance: Secondary | ICD-10-CM | POA: Diagnosis not present

## 2020-08-13 DIAGNOSIS — I130 Hypertensive heart and chronic kidney disease with heart failure and stage 1 through stage 4 chronic kidney disease, or unspecified chronic kidney disease: Secondary | ICD-10-CM | POA: Diagnosis not present

## 2020-08-13 DIAGNOSIS — N183 Chronic kidney disease, stage 3 unspecified: Secondary | ICD-10-CM | POA: Diagnosis not present

## 2020-08-13 DIAGNOSIS — D329 Benign neoplasm of meninges, unspecified: Secondary | ICD-10-CM | POA: Diagnosis not present

## 2020-08-13 DIAGNOSIS — G40209 Localization-related (focal) (partial) symptomatic epilepsy and epileptic syndromes with complex partial seizures, not intractable, without status epilepticus: Secondary | ICD-10-CM | POA: Diagnosis not present

## 2020-08-13 DIAGNOSIS — L89622 Pressure ulcer of left heel, stage 2: Secondary | ICD-10-CM | POA: Diagnosis not present

## 2020-08-13 DIAGNOSIS — N400 Enlarged prostate without lower urinary tract symptoms: Secondary | ICD-10-CM | POA: Diagnosis not present

## 2020-08-13 DIAGNOSIS — F0150 Vascular dementia without behavioral disturbance: Secondary | ICD-10-CM | POA: Diagnosis not present

## 2020-08-13 DIAGNOSIS — I69393 Ataxia following cerebral infarction: Secondary | ICD-10-CM | POA: Diagnosis not present

## 2020-08-13 DIAGNOSIS — I5032 Chronic diastolic (congestive) heart failure: Secondary | ICD-10-CM | POA: Diagnosis not present

## 2020-08-16 DIAGNOSIS — N400 Enlarged prostate without lower urinary tract symptoms: Secondary | ICD-10-CM | POA: Diagnosis not present

## 2020-08-16 DIAGNOSIS — F0150 Vascular dementia without behavioral disturbance: Secondary | ICD-10-CM | POA: Diagnosis not present

## 2020-08-16 DIAGNOSIS — I130 Hypertensive heart and chronic kidney disease with heart failure and stage 1 through stage 4 chronic kidney disease, or unspecified chronic kidney disease: Secondary | ICD-10-CM | POA: Diagnosis not present

## 2020-08-16 DIAGNOSIS — I69393 Ataxia following cerebral infarction: Secondary | ICD-10-CM | POA: Diagnosis not present

## 2020-08-16 DIAGNOSIS — I5032 Chronic diastolic (congestive) heart failure: Secondary | ICD-10-CM | POA: Diagnosis not present

## 2020-08-16 DIAGNOSIS — L89622 Pressure ulcer of left heel, stage 2: Secondary | ICD-10-CM | POA: Diagnosis not present

## 2020-08-16 DIAGNOSIS — G40209 Localization-related (focal) (partial) symptomatic epilepsy and epileptic syndromes with complex partial seizures, not intractable, without status epilepticus: Secondary | ICD-10-CM | POA: Diagnosis not present

## 2020-08-16 DIAGNOSIS — N183 Chronic kidney disease, stage 3 unspecified: Secondary | ICD-10-CM | POA: Diagnosis not present

## 2020-08-16 DIAGNOSIS — D329 Benign neoplasm of meninges, unspecified: Secondary | ICD-10-CM | POA: Diagnosis not present

## 2020-08-19 DIAGNOSIS — N400 Enlarged prostate without lower urinary tract symptoms: Secondary | ICD-10-CM | POA: Diagnosis not present

## 2020-08-19 DIAGNOSIS — D329 Benign neoplasm of meninges, unspecified: Secondary | ICD-10-CM | POA: Diagnosis not present

## 2020-08-19 DIAGNOSIS — I5032 Chronic diastolic (congestive) heart failure: Secondary | ICD-10-CM | POA: Diagnosis not present

## 2020-08-19 DIAGNOSIS — G40209 Localization-related (focal) (partial) symptomatic epilepsy and epileptic syndromes with complex partial seizures, not intractable, without status epilepticus: Secondary | ICD-10-CM | POA: Diagnosis not present

## 2020-08-19 DIAGNOSIS — N183 Chronic kidney disease, stage 3 unspecified: Secondary | ICD-10-CM | POA: Diagnosis not present

## 2020-08-19 DIAGNOSIS — L89622 Pressure ulcer of left heel, stage 2: Secondary | ICD-10-CM | POA: Diagnosis not present

## 2020-08-19 DIAGNOSIS — I69393 Ataxia following cerebral infarction: Secondary | ICD-10-CM | POA: Diagnosis not present

## 2020-08-19 DIAGNOSIS — I130 Hypertensive heart and chronic kidney disease with heart failure and stage 1 through stage 4 chronic kidney disease, or unspecified chronic kidney disease: Secondary | ICD-10-CM | POA: Diagnosis not present

## 2020-08-19 DIAGNOSIS — F0150 Vascular dementia without behavioral disturbance: Secondary | ICD-10-CM | POA: Diagnosis not present

## 2020-08-24 DIAGNOSIS — D329 Benign neoplasm of meninges, unspecified: Secondary | ICD-10-CM | POA: Diagnosis not present

## 2020-08-24 DIAGNOSIS — N183 Chronic kidney disease, stage 3 unspecified: Secondary | ICD-10-CM | POA: Diagnosis not present

## 2020-08-24 DIAGNOSIS — I130 Hypertensive heart and chronic kidney disease with heart failure and stage 1 through stage 4 chronic kidney disease, or unspecified chronic kidney disease: Secondary | ICD-10-CM | POA: Diagnosis not present

## 2020-08-24 DIAGNOSIS — G40209 Localization-related (focal) (partial) symptomatic epilepsy and epileptic syndromes with complex partial seizures, not intractable, without status epilepticus: Secondary | ICD-10-CM | POA: Diagnosis not present

## 2020-08-24 DIAGNOSIS — I69393 Ataxia following cerebral infarction: Secondary | ICD-10-CM | POA: Diagnosis not present

## 2020-08-24 DIAGNOSIS — N400 Enlarged prostate without lower urinary tract symptoms: Secondary | ICD-10-CM | POA: Diagnosis not present

## 2020-08-24 DIAGNOSIS — L89622 Pressure ulcer of left heel, stage 2: Secondary | ICD-10-CM | POA: Diagnosis not present

## 2020-08-24 DIAGNOSIS — F0150 Vascular dementia without behavioral disturbance: Secondary | ICD-10-CM | POA: Diagnosis not present

## 2020-08-24 DIAGNOSIS — I5032 Chronic diastolic (congestive) heart failure: Secondary | ICD-10-CM | POA: Diagnosis not present

## 2020-08-30 DIAGNOSIS — G40209 Localization-related (focal) (partial) symptomatic epilepsy and epileptic syndromes with complex partial seizures, not intractable, without status epilepticus: Secondary | ICD-10-CM | POA: Diagnosis not present

## 2020-08-30 DIAGNOSIS — F0150 Vascular dementia without behavioral disturbance: Secondary | ICD-10-CM | POA: Diagnosis not present

## 2020-08-30 DIAGNOSIS — D696 Thrombocytopenia, unspecified: Secondary | ICD-10-CM | POA: Diagnosis not present

## 2020-08-30 DIAGNOSIS — I130 Hypertensive heart and chronic kidney disease with heart failure and stage 1 through stage 4 chronic kidney disease, or unspecified chronic kidney disease: Secondary | ICD-10-CM | POA: Diagnosis not present

## 2020-08-30 DIAGNOSIS — N400 Enlarged prostate without lower urinary tract symptoms: Secondary | ICD-10-CM | POA: Diagnosis not present

## 2020-08-30 DIAGNOSIS — N183 Chronic kidney disease, stage 3 unspecified: Secondary | ICD-10-CM | POA: Diagnosis not present

## 2020-08-30 DIAGNOSIS — I5032 Chronic diastolic (congestive) heart failure: Secondary | ICD-10-CM | POA: Diagnosis not present

## 2020-08-30 DIAGNOSIS — I69393 Ataxia following cerebral infarction: Secondary | ICD-10-CM | POA: Diagnosis not present

## 2020-08-30 DIAGNOSIS — D329 Benign neoplasm of meninges, unspecified: Secondary | ICD-10-CM | POA: Diagnosis not present

## 2020-09-02 ENCOUNTER — Encounter: Payer: Medicare HMO | Admitting: Internal Medicine

## 2020-09-02 DIAGNOSIS — G4089 Other seizures: Secondary | ICD-10-CM | POA: Diagnosis not present

## 2020-09-02 DIAGNOSIS — M6281 Muscle weakness (generalized): Secondary | ICD-10-CM | POA: Diagnosis not present

## 2020-09-02 DIAGNOSIS — L89622 Pressure ulcer of left heel, stage 2: Secondary | ICD-10-CM | POA: Diagnosis not present

## 2020-09-02 DIAGNOSIS — U071 COVID-19: Secondary | ICD-10-CM | POA: Diagnosis not present

## 2020-09-02 DIAGNOSIS — Z9181 History of falling: Secondary | ICD-10-CM | POA: Diagnosis not present

## 2020-09-02 DIAGNOSIS — F0150 Vascular dementia without behavioral disturbance: Secondary | ICD-10-CM | POA: Diagnosis not present

## 2020-09-02 DIAGNOSIS — I5032 Chronic diastolic (congestive) heart failure: Secondary | ICD-10-CM | POA: Diagnosis not present

## 2020-09-11 DIAGNOSIS — R55 Syncope and collapse: Secondary | ICD-10-CM | POA: Diagnosis not present

## 2020-09-11 DIAGNOSIS — R0789 Other chest pain: Secondary | ICD-10-CM | POA: Diagnosis not present

## 2020-09-11 DIAGNOSIS — R0602 Shortness of breath: Secondary | ICD-10-CM | POA: Diagnosis not present

## 2020-09-11 DIAGNOSIS — R4182 Altered mental status, unspecified: Secondary | ICD-10-CM | POA: Diagnosis not present

## 2020-09-11 DIAGNOSIS — R079 Chest pain, unspecified: Secondary | ICD-10-CM | POA: Diagnosis not present

## 2020-09-14 ENCOUNTER — Encounter: Payer: Self-pay | Admitting: *Deleted

## 2020-09-15 DIAGNOSIS — F0150 Vascular dementia without behavioral disturbance: Secondary | ICD-10-CM | POA: Diagnosis not present

## 2020-09-15 DIAGNOSIS — I69393 Ataxia following cerebral infarction: Secondary | ICD-10-CM | POA: Diagnosis not present

## 2020-09-15 DIAGNOSIS — D329 Benign neoplasm of meninges, unspecified: Secondary | ICD-10-CM | POA: Diagnosis not present

## 2020-09-15 DIAGNOSIS — D696 Thrombocytopenia, unspecified: Secondary | ICD-10-CM | POA: Diagnosis not present

## 2020-09-15 DIAGNOSIS — N183 Chronic kidney disease, stage 3 unspecified: Secondary | ICD-10-CM | POA: Diagnosis not present

## 2020-09-15 DIAGNOSIS — G40209 Localization-related (focal) (partial) symptomatic epilepsy and epileptic syndromes with complex partial seizures, not intractable, without status epilepticus: Secondary | ICD-10-CM | POA: Diagnosis not present

## 2020-09-15 DIAGNOSIS — I5032 Chronic diastolic (congestive) heart failure: Secondary | ICD-10-CM | POA: Diagnosis not present

## 2020-09-15 DIAGNOSIS — I130 Hypertensive heart and chronic kidney disease with heart failure and stage 1 through stage 4 chronic kidney disease, or unspecified chronic kidney disease: Secondary | ICD-10-CM | POA: Diagnosis not present

## 2020-09-15 DIAGNOSIS — N400 Enlarged prostate without lower urinary tract symptoms: Secondary | ICD-10-CM | POA: Diagnosis not present

## 2020-09-16 ENCOUNTER — Telehealth: Payer: Self-pay

## 2020-09-16 ENCOUNTER — Other Ambulatory Visit: Payer: Self-pay

## 2020-09-16 MED ORDER — ATORVASTATIN CALCIUM 20 MG PO TABS: 20.0000 mg | ORAL_TABLET | Freq: Every day | ORAL | 0 refills | Status: DC

## 2020-09-16 MED ORDER — ATORVASTATIN CALCIUM 20 MG PO TABS: 20.0000 mg | ORAL_TABLET | Freq: Every day | ORAL | 3 refills | Status: DC

## 2020-09-16 NOTE — Telephone Encounter (Signed)
Lety with [facility] requesting refill on atorvastatin (LIPITOR) 20 MG tablet for 90 day supply.  She also requesting atorvastatin (LIPITOR) 20 MG tablet to be sent to [facility] on [address] for 30 days supply, so the pt don't have to wait for the pharmacy to delivery the medication to his house.

## 2020-09-16 NOTE — Telephone Encounter (Signed)
Error

## 2020-09-21 DIAGNOSIS — F0150 Vascular dementia without behavioral disturbance: Secondary | ICD-10-CM | POA: Diagnosis not present

## 2020-09-21 DIAGNOSIS — I130 Hypertensive heart and chronic kidney disease with heart failure and stage 1 through stage 4 chronic kidney disease, or unspecified chronic kidney disease: Secondary | ICD-10-CM | POA: Diagnosis not present

## 2020-09-21 DIAGNOSIS — I69393 Ataxia following cerebral infarction: Secondary | ICD-10-CM | POA: Diagnosis not present

## 2020-09-21 DIAGNOSIS — D696 Thrombocytopenia, unspecified: Secondary | ICD-10-CM | POA: Diagnosis not present

## 2020-09-21 DIAGNOSIS — D329 Benign neoplasm of meninges, unspecified: Secondary | ICD-10-CM | POA: Diagnosis not present

## 2020-09-21 DIAGNOSIS — G40209 Localization-related (focal) (partial) symptomatic epilepsy and epileptic syndromes with complex partial seizures, not intractable, without status epilepticus: Secondary | ICD-10-CM | POA: Diagnosis not present

## 2020-09-21 DIAGNOSIS — N400 Enlarged prostate without lower urinary tract symptoms: Secondary | ICD-10-CM | POA: Diagnosis not present

## 2020-09-21 DIAGNOSIS — I5032 Chronic diastolic (congestive) heart failure: Secondary | ICD-10-CM | POA: Diagnosis not present

## 2020-09-21 DIAGNOSIS — N183 Chronic kidney disease, stage 3 unspecified: Secondary | ICD-10-CM | POA: Diagnosis not present

## 2020-09-29 ENCOUNTER — Telehealth: Payer: Self-pay

## 2020-09-29 DIAGNOSIS — G40209 Localization-related (focal) (partial) symptomatic epilepsy and epileptic syndromes with complex partial seizures, not intractable, without status epilepticus: Secondary | ICD-10-CM | POA: Diagnosis not present

## 2020-09-29 DIAGNOSIS — I5032 Chronic diastolic (congestive) heart failure: Secondary | ICD-10-CM | POA: Diagnosis not present

## 2020-09-29 DIAGNOSIS — D696 Thrombocytopenia, unspecified: Secondary | ICD-10-CM | POA: Diagnosis not present

## 2020-09-29 DIAGNOSIS — N183 Chronic kidney disease, stage 3 unspecified: Secondary | ICD-10-CM | POA: Diagnosis not present

## 2020-09-29 DIAGNOSIS — D329 Benign neoplasm of meninges, unspecified: Secondary | ICD-10-CM | POA: Diagnosis not present

## 2020-09-29 DIAGNOSIS — I130 Hypertensive heart and chronic kidney disease with heart failure and stage 1 through stage 4 chronic kidney disease, or unspecified chronic kidney disease: Secondary | ICD-10-CM | POA: Diagnosis not present

## 2020-09-29 DIAGNOSIS — F0150 Vascular dementia without behavioral disturbance: Secondary | ICD-10-CM | POA: Diagnosis not present

## 2020-09-29 DIAGNOSIS — N400 Enlarged prostate without lower urinary tract symptoms: Secondary | ICD-10-CM | POA: Diagnosis not present

## 2020-09-29 DIAGNOSIS — I69393 Ataxia following cerebral infarction: Secondary | ICD-10-CM | POA: Diagnosis not present

## 2020-09-29 NOTE — Telephone Encounter (Signed)
I agree

## 2020-09-29 NOTE — Telephone Encounter (Signed)
Received a TC from [place] w/ [facility].  States the patient was with them before he went into a nursing facility and the family reached out to them and asked if they would start Palliative Care again, now that he is home.  Manus Gunning is wanting to know if this is ok to start palliative care, states she will send request for verbal orders after [facility].  Pt has an upcoming appt w/ PCP on 10/07/20.  Will forward to PCP and if he approves, triage can call Manus Gunning back. SChaplin, RN,BSN

## 2020-09-30 NOTE — Telephone Encounter (Signed)
TC to [place], spoke with Manus Gunning and she was informed Dr. Heber [place] was in agreement to restart Palliative care. SChaplin, RN,BSN

## 2020-10-02 DIAGNOSIS — M6281 Muscle weakness (generalized): Secondary | ICD-10-CM | POA: Diagnosis not present

## 2020-10-02 DIAGNOSIS — Z9181 History of falling: Secondary | ICD-10-CM | POA: Diagnosis not present

## 2020-10-02 DIAGNOSIS — U071 COVID-19: Secondary | ICD-10-CM | POA: Diagnosis not present

## 2020-10-02 DIAGNOSIS — F0150 Vascular dementia without behavioral disturbance: Secondary | ICD-10-CM | POA: Diagnosis not present

## 2020-10-02 DIAGNOSIS — G4089 Other seizures: Secondary | ICD-10-CM | POA: Diagnosis not present

## 2020-10-02 DIAGNOSIS — L89622 Pressure ulcer of left heel, stage 2: Secondary | ICD-10-CM | POA: Diagnosis not present

## 2020-10-02 DIAGNOSIS — I5032 Chronic diastolic (congestive) heart failure: Secondary | ICD-10-CM | POA: Diagnosis not present

## 2020-10-07 ENCOUNTER — Other Ambulatory Visit: Payer: Self-pay

## 2020-10-07 ENCOUNTER — Encounter: Payer: Self-pay | Admitting: Internal Medicine

## 2020-10-07 ENCOUNTER — Ambulatory Visit (INDEPENDENT_AMBULATORY_CARE_PROVIDER_SITE_OTHER): Payer: Medicare HMO | Admitting: Internal Medicine

## 2020-10-07 VITALS — BP 142/80 | HR 73 | Temp 98.1°F | Ht 69.0 in | Wt 187.7 lb

## 2020-10-07 DIAGNOSIS — F0150 Vascular dementia without behavioral disturbance: Secondary | ICD-10-CM | POA: Diagnosis not present

## 2020-10-07 DIAGNOSIS — I10 Essential (primary) hypertension: Secondary | ICD-10-CM

## 2020-10-07 DIAGNOSIS — K5731 Diverticulosis of large intestine without perforation or abscess with bleeding: Secondary | ICD-10-CM | POA: Diagnosis not present

## 2020-10-07 DIAGNOSIS — E785 Hyperlipidemia, unspecified: Secondary | ICD-10-CM | POA: Diagnosis not present

## 2020-10-07 DIAGNOSIS — I5032 Chronic diastolic (congestive) heart failure: Secondary | ICD-10-CM | POA: Diagnosis not present

## 2020-10-07 DIAGNOSIS — R531 Weakness: Secondary | ICD-10-CM | POA: Diagnosis not present

## 2020-10-07 NOTE — Patient Instructions (Addendum)
Thank you for visiting the Internal Medicine Clinic today. It was a pleasure to meet you! Today we discussed dietary changes to help minimize diverticulitis flares as well as your request for more physical therapy.  As we discussed, foods that are great to eat with a history of diverticulosis include those that are high in fiber like fruits and vegetables, whole grains, beans, and lentils. Try to avoid foods that are low in fiber or high in sugar like red and processed meats, fried foods, and dairy products that are full-fat. Also maintaining good bowel patterns like avoiding constipation will be helpful. You do not need to worry about avoiding nuts or popcorn.  Lab tests: CBC: to check your blood Iron studies: to check your iron levels  Referrals: Physical therapy  Follow-up: In 3 months or sooner if needed.  Remember: If you have any questions or concerns, please call our clinic at [phone] between 9am-5pm and after hours call [phone] and ask for the internal medicine resident on call. If you feel you are having a medical emergency please call 911.  Farrel Gordon, DO

## 2020-10-07 NOTE — Assessment & Plan Note (Signed)
Patient's daughter endorses continued weakness with ADLs including going from seated to standing. She notes improvement with physical therapy in the past. - Referral for physical therapy placed.

## 2020-10-07 NOTE — Assessment & Plan Note (Signed)
Patient denies GI upset, myalgias. - Continue atorvastatin 20 mg daily.

## 2020-10-07 NOTE — Assessment & Plan Note (Deleted)
Most recent GFR in May 2022 was 30 which is comparable to previous levels. - Continue lasix 40 mg daily

## 2020-10-07 NOTE — Assessment & Plan Note (Signed)
Hypertensive at 142/80 today though this seems consistent with previous readings. His daughter does admit that he has not taken his blood pressure medication this morning as they were in a rush. - Continue amlodipine 5 mg daily - Continue lasix 40 mg daily

## 2020-10-07 NOTE — Progress Notes (Signed)
CC: hospital follow-up  HPI:  [name] is a 87 y.o. male with past medical history as listed below who presents for hospital follow up. [name] was hospitalized in May 2022 with lower GI bleed due to diverticular hemorrhage. His daughter notes that [name] is not the first time this has happened however the most recent episode was more significant than past episodes. Throughout the course of his admission he received 2 units of PRBC and stabilized to a hemoglobin of 8-9 which he was discharged at. He was asymptomatic on discharge and continues to report no symptoms at this time.  His daughter did have concerns regarding what foods were appropriate for patients with diverticulosis as she wishes to avoid any other episodes of GI bleed.   Past Medical History:  Diagnosis Date   Anemia    BPH (benign prostatic hypertrophy)    TURP 05/19/13   Cerebral embolism with cerebral infarction ([place]) 12/19/2013   Cerebrovascular accident (CVA) ([place]) 04/17/2019   Chronic kidney disease    CHRONIC KIDNEY DISEASE, 2   Difficulty hearing, right    BILATERAL HEARING LOSS - BEST TO TRY TO SPEAK INTO LEFT EAR   DVT (deep venous thrombosis) ([facility])    Frequent falls    History of DVT (deep vein thrombosis) 06/10/2013   Provoked s/p TURP. Dx per doppler 06/10/13. Complicated by hematuria while on anticoagulation s/p TURP. Initially on lovenox and coumadin, then stopped, then IVC filter placed 06/16/13. Anticoagulation restarted after hematuria stopped. Lovenox was discontinued apparently on 07/29/13 with comment on high risk for falls.   Total ~1.5 months of anticoagulation but interrupted with bleeding complication.   Hyperlipidemia    Hypertension    Incontinence of urine    SOME INCONTINENCE   Left adrenal mass ([place]) 09/11/2013   Meningioma ([place])    Stroke ([place])    Cerebellar, 2013; WALKS WITH WALKER, ABLE TO DRESS AND BATHE HIMSELF BUT FAMILY TRIES TO PROVIDE SUPERVISION BECAUSE OF HIS HX OF FALL AND  WEAKNESS LEGS, ARMS    Thrombocytopenia ([place])    Vitreous hemorrhage ([place]) 12/21/2013   OS 12/18/13 CT /MRI brain done for ataxia    Review of Systems:  Review of Systems  Constitutional:  Negative for chills, fever, malaise/fatigue and weight loss.  HENT:  Positive for hearing loss.   Respiratory:  Negative for shortness of breath.   Cardiovascular:  Negative for chest pain.  Gastrointestinal:  Negative for blood in stool, constipation, diarrhea and melena.  Neurological:  Negative for loss of consciousness and headaches.    Physical Exam:  Vitals:   10/07/20 1000  BP: (!) 142/80  Pulse: 73  Temp: 98.1 F (36.7 C)  TempSrc: Oral  SpO2: 97%  Weight: 187 lb 11.2 oz (85.1 kg)  Height: '5\' 9"'$  (1.753 m)   Physical Exam Vitals and nursing note reviewed.  Constitutional:      Appearance: Normal appearance.  Cardiovascular:     Rate and Rhythm: Normal rate and regular rhythm.     Pulses:          Radial pulses are 2+ on the right side and 2+ on the left side.     Heart sounds: Normal heart sounds.  Pulmonary:     Effort: Pulmonary effort is normal.     Breath sounds: Normal breath sounds and air entry.  Abdominal:     General: Abdomen is flat.     Palpations: Abdomen is soft.     Tenderness: There is no  abdominal tenderness. There is no guarding.  Musculoskeletal:     Right lower leg: No edema.     Left lower leg: No edema.  Skin:    General: Skin is warm and dry.  Psychiatric:        Attention and Perception: Attention normal.        Mood and Affect: Mood and affect normal.     Assessment & Plan:   See Encounters Tab for problem based charting.  Patient seen with Dr. Dareen Piano

## 2020-10-07 NOTE — Assessment & Plan Note (Addendum)
Patient recently (07/2020) hospitalized for diverticulosis with hemorrhage. He denies any recurrent bloody stools, abdominal pain, weakness, headache, or fainting. His daughter who was present was concerned about making sure that his diet is not consisting of foods that could risk an additional flare. - Counseled patient on dietary choices that are advised for diverticulosis including consuming foods high in fiber like fruits and vegetables, whole grains, beans, and lentils, and avoiding foods that are low in fiber and high in sugar like red and processed meats, fried food, full fat dairy products.  Addendum 10/08/2020: Hgb has increased from hospitalization to 11.7. Iron studies were abnormal: iron 33, iron saturation 9%, ferritin 24. Patient advised to take ferrous sulfate every other day. If he is unable to tolerate this, he is advised to let us know as he may better tolerate infusion. - Recheck iron studies at next visit.

## 2020-10-07 NOTE — Assessment & Plan Note (Signed)
Patient appears euvolemic in clinic and denies shortness of breath. There is no extremity edema.

## 2020-10-08 ENCOUNTER — Telehealth: Payer: Self-pay | Admitting: Internal Medicine

## 2020-10-08 ENCOUNTER — Other Ambulatory Visit: Payer: Self-pay | Admitting: Internal Medicine

## 2020-10-08 LAB — CBC
Hematocrit: 36.8 % — ABNORMAL LOW (ref 37.5–51.0)
Hemoglobin: 11.7 g/dL — ABNORMAL LOW (ref 13.0–17.7)
MCH: 28.1 pg (ref 26.6–33.0)
MCHC: 31.8 g/dL (ref 31.5–35.7)
MCV: 88 fL (ref 79–97)
Platelets: 168 10*3/uL (ref 150–450)
RBC: 4.17 x10E6/uL (ref 4.14–5.80)
RDW: 13.8 % (ref 11.6–15.4)
WBC: 8 10*3/uL (ref 3.4–10.8)

## 2020-10-08 LAB — IRON,TIBC AND FERRITIN PANEL
Ferritin: 24 ng/mL — ABNORMAL LOW (ref 30–400)
Iron Saturation: 9 % — CL (ref 15–55)
Iron: 33 ug/dL — ABNORMAL LOW (ref 38–169)
Total Iron Binding Capacity: 361 ug/dL (ref 250–450)
UIBC: 328 ug/dL (ref 111–343)

## 2020-10-08 MED ORDER — FERROUS SULFATE 325 (65 FE) MG PO TABS
325.0000 mg | ORAL_TABLET | ORAL | 2 refills | Status: DC
Start: 2020-10-08 — End: 2020-10-14

## 2020-10-08 NOTE — Telephone Encounter (Signed)
Attempted to call patient but was given message that the call could not be completed. I was calling to let him know that his hemoglobin improved but that his iron studies were low, and that I am sending in a prescription for an iron supplement for him to take every other day. If he is unable to tolerate the medication for any reason I need him to let us know so that we can find an alternative to increasing his iron levels.

## 2020-10-14 ENCOUNTER — Emergency Department (HOSPITAL_COMMUNITY): Payer: Medicare HMO

## 2020-10-14 ENCOUNTER — Other Ambulatory Visit: Payer: Self-pay

## 2020-10-14 ENCOUNTER — Inpatient Hospital Stay (HOSPITAL_COMMUNITY)
Admission: EM | Admit: 2020-10-14 | Discharge: 2020-10-17 | DRG: 864 | Disposition: A | Discharge status: Home / Self Care | Payer: Medicare HMO | Attending: Student in an Organized Health Care Education/Training Program | Admitting: Student in an Organized Health Care Education/Training Program

## 2020-10-14 DIAGNOSIS — N183 Chronic kidney disease, stage 3 unspecified: Secondary | ICD-10-CM | POA: Diagnosis not present

## 2020-10-14 DIAGNOSIS — Z8249 Family history of ischemic heart disease and other diseases of the circulatory system: Secondary | ICD-10-CM

## 2020-10-14 DIAGNOSIS — N400 Enlarged prostate without lower urinary tract symptoms: Secondary | ICD-10-CM | POA: Diagnosis present

## 2020-10-14 DIAGNOSIS — R32 Unspecified urinary incontinence: Secondary | ICD-10-CM | POA: Diagnosis present

## 2020-10-14 DIAGNOSIS — I129 Hypertensive chronic kidney disease with stage 1 through stage 4 chronic kidney disease, or unspecified chronic kidney disease: Secondary | ICD-10-CM | POA: Diagnosis present

## 2020-10-14 DIAGNOSIS — Z86011 Personal history of benign neoplasm of the brain: Secondary | ICD-10-CM | POA: Diagnosis not present

## 2020-10-14 DIAGNOSIS — N179 Acute kidney failure, unspecified: Secondary | ICD-10-CM | POA: Diagnosis present

## 2020-10-14 DIAGNOSIS — R350 Frequency of micturition: Secondary | ICD-10-CM | POA: Diagnosis present

## 2020-10-14 DIAGNOSIS — R300 Dysuria: Secondary | ICD-10-CM | POA: Diagnosis present

## 2020-10-14 DIAGNOSIS — Z9079 Acquired absence of other genital organ(s): Secondary | ICD-10-CM | POA: Diagnosis not present

## 2020-10-14 DIAGNOSIS — R410 Disorientation, unspecified: Secondary | ICD-10-CM | POA: Diagnosis not present

## 2020-10-14 DIAGNOSIS — Z20822 Contact with and (suspected) exposure to covid-19: Secondary | ICD-10-CM | POA: Diagnosis present

## 2020-10-14 DIAGNOSIS — Z87440 Personal history of urinary (tract) infections: Secondary | ICD-10-CM

## 2020-10-14 DIAGNOSIS — Z7982 Long term (current) use of aspirin: Secondary | ICD-10-CM

## 2020-10-14 DIAGNOSIS — A419 Sepsis, unspecified organism: Secondary | ICD-10-CM | POA: Diagnosis not present

## 2020-10-14 DIAGNOSIS — R0682 Tachypnea, not elsewhere classified: Secondary | ICD-10-CM | POA: Diagnosis present

## 2020-10-14 DIAGNOSIS — N1831 Chronic kidney disease, stage 3a: Secondary | ICD-10-CM | POA: Diagnosis not present

## 2020-10-14 DIAGNOSIS — H9193 Unspecified hearing loss, bilateral: Secondary | ICD-10-CM | POA: Diagnosis present

## 2020-10-14 DIAGNOSIS — F0150 Vascular dementia without behavioral disturbance: Secondary | ICD-10-CM | POA: Diagnosis present

## 2020-10-14 DIAGNOSIS — I69398 Other sequelae of cerebral infarction: Secondary | ICD-10-CM | POA: Diagnosis not present

## 2020-10-14 DIAGNOSIS — Z79899 Other long term (current) drug therapy: Secondary | ICD-10-CM

## 2020-10-14 DIAGNOSIS — R0689 Other abnormalities of breathing: Secondary | ICD-10-CM | POA: Diagnosis not present

## 2020-10-14 DIAGNOSIS — R0902 Hypoxemia: Secondary | ICD-10-CM | POA: Diagnosis not present

## 2020-10-14 DIAGNOSIS — R000 Tachycardia, unspecified: Secondary | ICD-10-CM | POA: Diagnosis present

## 2020-10-14 DIAGNOSIS — R509 Fever, unspecified: Secondary | ICD-10-CM | POA: Diagnosis not present

## 2020-10-14 DIAGNOSIS — R197 Diarrhea, unspecified: Secondary | ICD-10-CM | POA: Diagnosis present

## 2020-10-14 DIAGNOSIS — Z86718 Personal history of other venous thrombosis and embolism: Secondary | ICD-10-CM | POA: Diagnosis not present

## 2020-10-14 DIAGNOSIS — E785 Hyperlipidemia, unspecified: Secondary | ICD-10-CM | POA: Diagnosis not present

## 2020-10-14 DIAGNOSIS — Z833 Family history of diabetes mellitus: Secondary | ICD-10-CM

## 2020-10-14 DIAGNOSIS — R404 Transient alteration of awareness: Secondary | ICD-10-CM | POA: Diagnosis not present

## 2020-10-14 DIAGNOSIS — I517 Cardiomegaly: Secondary | ICD-10-CM | POA: Diagnosis not present

## 2020-10-14 LAB — COMPREHENSIVE METABOLIC PANEL
ALT: 11 U/L (ref 0–44)
AST: 21 U/L (ref 15–41)
Albumin: 3.2 g/dL — ABNORMAL LOW (ref 3.5–5.0)
Alkaline Phosphatase: 58 U/L (ref 38–126)
Anion gap: 10 (ref 5–15)
BUN: 25 mg/dL — ABNORMAL HIGH (ref 8–23)
CO2: 24 mmol/L (ref 22–32)
Calcium: 9.5 mg/dL (ref 8.9–10.3)
Chloride: 104 mmol/L (ref 98–111)
Creatinine, Ser: 1.41 mg/dL — ABNORMAL HIGH (ref 0.61–1.24)
GFR, Estimated: 46 mL/min — ABNORMAL LOW (ref >60)
Glucose, Bld: 140 mg/dL — ABNORMAL HIGH (ref 70–99)
Potassium: 3.5 mmol/L (ref 3.5–5.1)
Sodium: 138 mmol/L (ref 135–145)
Total Bilirubin: 0.7 mg/dL (ref 0.3–1.2)
Total Protein: 6.5 g/dL (ref 6.5–8.1)

## 2020-10-14 LAB — RESP PANEL BY RT-PCR (FLU A&B, COVID) ARPGX2
Influenza A by PCR: NEGATIVE
Influenza B by PCR: NEGATIVE
SARS Coronavirus 2 by RT PCR: NEGATIVE

## 2020-10-14 LAB — CBC WITH DIFFERENTIAL/PLATELET
Abs Immature Granulocytes: 0.07 10*3/uL (ref 0.00–0.07)
Basophils Absolute: 0 10*3/uL (ref 0.0–0.1)
Basophils Relative: 0 %
Eosinophils Absolute: 0 10*3/uL (ref 0.0–0.5)
Eosinophils Relative: 0 %
HCT: 36.1 % — ABNORMAL LOW (ref 39.0–52.0)
Hemoglobin: 11.1 g/dL — ABNORMAL LOW (ref 13.0–17.0)
Immature Granulocytes: 0 %
Lymphocytes Relative: 3 %
Lymphs Abs: 0.4 10*3/uL — ABNORMAL LOW (ref 0.7–4.0)
MCH: 28 pg (ref 26.0–34.0)
MCHC: 30.7 g/dL (ref 30.0–36.0)
MCV: 91.2 fL (ref 80.0–100.0)
Monocytes Absolute: 1 10*3/uL (ref 0.1–1.0)
Monocytes Relative: 6 %
Neutro Abs: 14.9 10*3/uL — ABNORMAL HIGH (ref 1.7–7.7)
Neutrophils Relative %: 91 %
Platelets: 148 10*3/uL — ABNORMAL LOW (ref 150–400)
RBC: 3.96 MIL/uL — ABNORMAL LOW (ref 4.22–5.81)
RDW: 14.9 % (ref 11.5–15.5)
WBC: 16.4 10*3/uL — ABNORMAL HIGH (ref 4.0–10.5)
nRBC: 0 % (ref 0.0–0.2)

## 2020-10-14 LAB — PROTIME-INR
INR: 1.1 (ref 0.8–1.2)
Prothrombin Time: 14.4 seconds (ref 11.4–15.2)

## 2020-10-14 LAB — APTT: aPTT: 23 seconds — ABNORMAL LOW (ref 24–36)

## 2020-10-14 LAB — LACTIC ACID, PLASMA
Lactic Acid, Venous: 2.3 mmol/L (ref 0.5–1.9)
Lactic Acid, Venous: 1.8 mmol/L (ref 0.5–1.9)

## 2020-10-14 MED ORDER — ENOXAPARIN SODIUM 40 MG/0.4ML IJ SOSY: 40.0000 mg | PREFILLED_SYRINGE | INTRAMUSCULAR | Status: DC

## 2020-10-14 MED ORDER — SODIUM CHLORIDE 0.9 % IV SOLN
2.0000 g | Freq: Once | INTRAVENOUS | Status: AC
  Administered 2020-10-14: 2 g via INTRAVENOUS
  Filled 2020-10-14: qty 2

## 2020-10-14 MED ORDER — SODIUM CHLORIDE 0.9 % IV SOLN
2.0000 g | Freq: Two times a day (BID) | INTRAVENOUS | Status: DC
  Administered 2020-10-15 – 2020-10-16 (×3): 2 g via INTRAVENOUS
  Filled 2020-10-14 (×3): qty 2

## 2020-10-14 MED ORDER — LACTATED RINGERS IV SOLN: INTRAVENOUS | Status: AC

## 2020-10-14 MED ORDER — VANCOMYCIN HCL IN DEXTROSE 1-5 GM/200ML-% IV SOLN
1000.0000 mg | INTRAVENOUS | Status: DC
  Administered 2020-10-15: 1000 mg via INTRAVENOUS
  Filled 2020-10-14: qty 200

## 2020-10-14 MED ORDER — LACTATED RINGERS IV BOLUS (SEPSIS)
1000.0000 mL | Freq: Once | INTRAVENOUS | Status: AC
  Administered 2020-10-14: 1000 mL via INTRAVENOUS

## 2020-10-14 MED ORDER — VANCOMYCIN HCL 1750 MG/350ML IV SOLN
1750.0000 mg | Freq: Once | INTRAVENOUS | Status: AC
  Administered 2020-10-14: 1750 mg via INTRAVENOUS
  Filled 2020-10-14: qty 350

## 2020-10-14 MED ORDER — METRONIDAZOLE 500 MG/100ML IV SOLN
500.0000 mg | Freq: Once | INTRAVENOUS | Status: AC
  Administered 2020-10-14: 500 mg via INTRAVENOUS
  Filled 2020-10-14: qty 100

## 2020-10-14 NOTE — ED Provider Notes (Signed)
[facility] EMERGENCY DEPARTMENT Provider Note   CSN: [id number] Arrival date & time: 10/14/20  1656     History Chief Complaint  Patient presents with   urinary symptoms   Fever    [name] is a 87 y.o. male.  Patient brought in from home.  By EMS.  The complaint was urinary frequency foul urine odor.  Weakness and confusion x3 days.  History of UTI.  EMS reported patient had a temperature of 100.6.  Past medical history significant for hypertension meningioma chronic kidney disease hearing loss stroke in 2013.  Walks with walker.  Hyperlipidemia history of deep vein thrombosis in 2015.  Cerebrovascular accident in 2021.  Patient's vital signs upon arrival meeting sepsis criteria.  Temperature of 103.  Respirations 27 heart rate 115.  Sepsis protocol will be initiated.      Past Medical History:  Diagnosis Date   Anemia    BPH (benign prostatic hypertrophy)    TURP 05/19/13   Cerebral embolism with cerebral infarction ([place]) 12/19/2013   Cerebrovascular accident (CVA) ([place]) 04/17/2019   Chronic kidney disease    CHRONIC KIDNEY DISEASE, 2   Difficulty hearing, right    BILATERAL HEARING LOSS - BEST TO TRY TO SPEAK INTO LEFT EAR   DVT (deep venous thrombosis) (HCC)    Frequent falls    History of DVT (deep vein thrombosis) 06/10/2013   Provoked s/p TURP. Dx per doppler 06/10/13. Complicated by hematuria while on anticoagulation s/p TURP. Initially on lovenox and coumadin, then stopped, then IVC filter placed 06/16/13. Anticoagulation restarted after hematuria stopped. Lovenox was discontinued apparently on 07/29/13 with comment on high risk for falls.   Total ~1.5 months of anticoagulation but interrupted with bleeding complication.   Hyperlipidemia    Hypertension    Incontinence of urine    SOME INCONTINENCE   Left adrenal mass ([place]) 09/11/2013   Meningioma ([place])    Stroke ([place])    Cerebellar, 2013; WALKS WITH WALKER, ABLE TO DRESS AND BATHE HIMSELF BUT  FAMILY TRIES TO PROVIDE SUPERVISION BECAUSE OF HIS HX OF FALL AND WEAKNESS LEGS, ARMS    Thrombocytopenia ([place])    Vitreous hemorrhage ([place]) 12/21/2013   OS 12/18/13 CT /MRI brain done for ataxia     Patient Active Problem List   Diagnosis Date Noted   Diverticulosis of colon with hemorrhage 07/19/2020   Acute lower GI bleeding 07/18/2020   Hematochezia    Acute blood loss anemia    Thrombocytopenia (HCC) 03/30/2020   Weakness 03/19/2020   COVID-19 03/19/2020   Partial symptomatic epilepsy with complex partial seizures, not intractable, without status epilepticus ([place]) 04/17/2019   Chronic diastolic (congestive) heart failure ([place]) 10/28/2017   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/13/2017   Vascular dementia ([place]) 01/28/2016   Presence of IVC filter 07/17/2014   CKD (chronic kidney disease) stage 3, GFR 30-59 ml/min (HCC) 06/23/2013   Hyperlipidemia    BPH (benign prostatic hyperplasia) 04/24/2013   Hearing loss 07/20/2011   Meningioma ([place]) 07/20/2011   HTN (hypertension) 07/19/2011    Past Surgical History:  Procedure Laterality Date   CYSTOSCOPY N/A 06/13/2013   Procedure: CYSTOSCOPY FLEXIBLE BEDSIDE;  Surgeon: Ardis Hughs, MD;  Location: [facility];  Service: Urology;  Laterality: N/A;   MYRINGOTOMY WITH TUBE PLACEMENT Bilateral    TRANSURETHRAL RESECTION OF PROSTATE N/A 05/19/2013   Procedure: TRANSURETHRAL RESECTION OF THE PROSTATE WITH GYRUS INSTRUMENTS;  Surgeon: Ailene Rud, MD;  Location: [facility];  Service: Urology;  Laterality: N/A;       Family History  Problem Relation Age of Onset   Diabetes Mother    Heart disease Mother    Hypertension Mother    Heart disease Father    Hypertension Father    Hypertension Daughter    Thyroid disease Daughter     Social History   Tobacco Use   Smoking status: Never   Smokeless tobacco: Never  Vaping Use   Vaping Use: Never used  Substance Use Topics   Alcohol use: No    Alcohol/week:  0.0 standard drinks   Drug use: No    Home Medications Prior to Admission medications   Medication Sig Start Date End Date Taking? Authorizing Provider  amLODipine (NORVASC) 5 MG tablet Take 1 tablet (5 mg total) by mouth daily. 07/23/20  Yes Maudie Mercury, MD  aspirin 81 MG EC tablet Take 1 tablet (81 mg total) by mouth daily. 07/23/20  Yes Maudie Mercury, MD  atorvastatin (LIPITOR) 20 MG tablet Take 1 tablet (20 mg total) by mouth daily. Patient taking differently: Take 20 mg by mouth every evening. 09/16/20  Yes [name], DO  furosemide (LASIX) 40 MG tablet Take 1 tablet (40 mg total) by mouth daily as needed for fluid. Patient taking differently: Take 40 mg by mouth daily. 07/23/20  Yes Maudie Mercury, MD  triamcinolone ointment (KENALOG) 0.1 % Apply 1 application topically 2 (two) times daily as needed (For leg rash). 07/23/20  Yes Maudie Mercury, MD    Allergies    Patient has no known allergies.  Review of Systems   Review of Systems  Unable to perform ROS: Dementia  Constitutional:  Positive for fever.  Gastrointestinal:  Positive for diarrhea.  Genitourinary:  Positive for dysuria.   Physical Exam Updated Vital Signs BP (!) 155/91   Pulse 87   Temp (!) 103.2 F (39.6 C) (Oral)   Resp (!) 29   SpO2 94%   Physical Exam Vitals and nursing note reviewed.  Constitutional:      General: He is in acute distress.     Appearance: He is well-developed.  HENT:     Head: Normocephalic and atraumatic.  Eyes:     Conjunctiva/sclera: Conjunctivae normal.  Cardiovascular:     Rate and Rhythm: Regular rhythm. Tachycardia present.     Heart sounds: No murmur heard. Pulmonary:     Effort: Pulmonary effort is normal. No respiratory distress.     Breath sounds: Normal breath sounds.     Comments: But tachypneic Abdominal:     Palpations: Abdomen is soft.     Tenderness: There is no abdominal tenderness.  Musculoskeletal:     Cervical back: Neck supple.  Skin:     General: Skin is warm and dry.  Neurological:     Mental Status: He is alert. Mental status is at baseline.     Comments: Alert verbal    ED Results / Procedures / Treatments   Labs (all labs ordered are listed, but only abnormal results are displayed) Labs Reviewed  LACTIC ACID, PLASMA - Abnormal; Notable for the following components:      Result Value   Lactic Acid, Venous 2.3 (*)    All other components within normal limits  COMPREHENSIVE METABOLIC PANEL - Abnormal; Notable for the following components:   Glucose, Bld 140 (*)    BUN 25 (*)    Creatinine, Ser 1.41 (*)    Albumin 3.2 (*)    GFR,  Estimated 46 (*)    All other components within normal limits  CBC WITH DIFFERENTIAL/PLATELET - Abnormal; Notable for the following components:   WBC 16.4 (*)    RBC 3.96 (*)    Hemoglobin 11.1 (*)    HCT 36.1 (*)    Platelets 148 (*)    Neutro Abs 14.9 (*)    Lymphs Abs 0.4 (*)    All other components within normal limits  APTT - Abnormal; Notable for the following components:   aPTT 23 (*)    All other components within normal limits  RESP PANEL BY RT-PCR (FLU A&B, COVID) ARPGX2  CULTURE, BLOOD (ROUTINE X 2)  CULTURE, BLOOD (ROUTINE X 2)  URINE CULTURE  PROTIME-INR  LACTIC ACID, PLASMA  URINALYSIS, ROUTINE W REFLEX MICROSCOPIC  MISCELLANEOUS GENETIC TEST    EKG None  Radiology DG Chest Port 1 View  Result Date: 10/14/2020 CLINICAL DATA:  Possible sepsis EXAM: PORTABLE CHEST 1 VIEW COMPARISON:  03/19/2020 FINDINGS: Low lung volumes. Enlarged cardiomediastinal silhouette. No focal airspace disease, pleural effusion or pneumothorax IMPRESSION: No active disease.  Low lung volumes with cardiomegaly Electronically Signed   By: Donavan Foil M.D.   On: 10/14/2020 18:50    Procedures Procedures   Medications Ordered in ED Medications  lactated ringers infusion (has no administration in time range)  lactated ringers bolus 1,000 mL (1,000 mLs Intravenous New Bag/Given 10/14/20  1823)    And  lactated ringers bolus 1,000 mL (has no administration in time range)    And  lactated ringers bolus 1,000 mL (1,000 mLs Intravenous New Bag/Given 10/14/20 2003)  metroNIDAZOLE (FLAGYL) IVPB 500 mg (500 mg Intravenous New Bag/Given 10/14/20 2004)  ceFEPIme (MAXIPIME) 2 g in sodium chloride 0.9 % 100 mL IVPB (2 g Intravenous New Bag/Given 10/14/20 1847)  vancomycin (VANCOREADY) IVPB 1750 mg/350 mL (1,750 mg Intravenous New Bag/Given 10/14/20 1821)    ED Course  I have reviewed the triage vital signs and the nursing notes.  Pertinent labs & imaging results that were available during my care of the patient were reviewed by me and considered in my medical decision making (see chart for details).    MDM Rules/Calculators/A&P                         CRITICAL CARE Performed by: Fredia Sorrow Total critical care time: 45 minutes Critical care time was exclusive of separately billable procedures and treating other patients. Critical care was necessary to treat or prevent imminent or life-threatening deterioration. Critical care was time spent personally by me on the following activities: development of treatment plan with patient and/or surrogate as well as nursing, discussions with consultants, evaluation of patient's response to treatment, examination of patient, obtaining history from patient or surrogate, ordering and performing treatments and interventions, ordering and review of laboratory studies, ordering and review of radiographic studies, pulse oximetry and re-evaluation of patient's condition.  Patient with vital signs that met sepsis criteria.  Sepsis criteria initiated.  Patient was tachypneic febrile tachycardic but not hypotensive.  Patient received full fluid resuscitation.  Broad-spectrum antibiotics.  Chest x-ray negative.  White blood cell count elevated at 16,000.  Initial lactic acid is 2.3.  Patient is now not tachycardic.  Blood pressures are still good.  Patient's  followed by internal medicine residency clinic.  We will contact them for admission for sepsis.  Urinalysis is still pending and may very well be the source of the infection.  Patient currently stabilizing.  Final Clinical Impression(s) / ED Diagnoses Final diagnoses:  Sepsis, due to unspecified organism, unspecified whether acute organ dysfunction present [facility])    Rx / DC Orders ED Discharge Orders     None        Fredia Sorrow, MD 10/14/20 2101

## 2020-10-14 NOTE — Sepsis Progress Note (Signed)
Sepsis protocol followed by eLink 

## 2020-10-14 NOTE — ED Triage Notes (Signed)
Pt here from home with c/o of urinary frequency, foul urine odor, weakness and confusion X3 days. Hx of UTI. EMS reports fever 100.6  HR 116 152/89 30 RR 95% RA  250 mLNS 1g tylenol PO

## 2020-10-14 NOTE — H&P (Addendum)
Date: 10/15/2020               Patient Name:  [name] MRN: [id number]  DOB: 24-Feb-1925 Age / Sex: 87 y.o., male   PCP: [name], DO         Medical Service: Internal Medicine Teaching Service         Attending Physician: Dr. Jimmye Norman, Elaina Pattee, MD    First Contact: Dr. Humphrey Rolls Pager: [phone]  Second Contact: Dr. Allyson Sabal Pager: [phone]       After Hours (After 5p/  First Contact Pager: [phone]  weekends / holidays): Second Contact Pager: [phone]   Chief Complaint: confusion   History of Present Illness: Mr. Diebel is a 87 y.o. male with past medical history of BPH, CVA, CKD, HLD, HTN, incontinence, and GI bleed who presents because of confusion as noted by his daughter, [name]. Mr. Stuebe is pleasantly confused at baseline so the majority of the history is gathered from [place]. She states that Wednesday, 10/13/2020 the patient was at baseline with no agitation and was this way the morning of Thursday 10/14/2020 as well. However around 3:50 p.m. which is his normal time to go sit out on the porch, she noted that he was speaking more aggressively than normal and seemed agitated. She states that she also noted weird posture when he stood from his wheelchair to his walker and that he seemed confused, seeming like he didn't know what to do once he was standing. At that time [name] assisted him to the restroom as she noted that he had urinary incontinence and at the time he was shaking as well. Because of his confusion and tremor, she activated the patient's life alert necklace which resulted in his transport to the ED.  He has not been complaining of any symptoms prior to this episode. She does note that he had had increased respirations earlier today but does not note increased urinary frequency of urine over the last few days. He has had some diarrhea. [name] has not noted any fever or chills. He has had UTI in the past however it was some time ago. He continues to  insist that he feels okay, both to [place] and myself on interview. He has been eating and drinking okay.  Meds:  Current Meds  Medication Sig   amLODipine (NORVASC) 5 MG tablet Take 1 tablet (5 mg total) by mouth daily.   aspirin 81 MG EC tablet Take 1 tablet (81 mg total) by mouth daily.   atorvastatin (LIPITOR) 20 MG tablet Take 1 tablet (20 mg total) by mouth daily. (Patient taking differently: Take 20 mg by mouth every evening.)   furosemide (LASIX) 40 MG tablet Take 1 tablet (40 mg total) by mouth daily as needed for fluid. (Patient taking differently: Take 40 mg by mouth daily.)   triamcinolone ointment (KENALOG) 0.1 % Apply 1 application topically 2 (two) times daily as needed (For leg rash).     Allergies: Allergies as of 10/14/2020   (No Known Allergies)   Past Medical History:  Diagnosis Date   Anemia    BPH (benign prostatic hypertrophy)    TURP 05/19/13   Cerebral embolism with cerebral infarction ([place]) 12/19/2013   Cerebrovascular accident (CVA) ([place]) 04/17/2019   Chronic kidney disease    CHRONIC KIDNEY DISEASE, 2   Difficulty hearing, right    BILATERAL HEARING LOSS - BEST TO TRY TO SPEAK INTO LEFT EAR   DVT (deep venous thrombosis) ([place])  Frequent falls    History of DVT (deep vein thrombosis) 06/10/2013   Provoked s/p TURP. Dx per doppler 06/10/13. Complicated by hematuria while on anticoagulation s/p TURP. Initially on lovenox and coumadin, then stopped, then IVC filter placed 06/16/13. Anticoagulation restarted after hematuria stopped. Lovenox was discontinued apparently on 07/29/13 with comment on high risk for falls.   Total ~1.5 months of anticoagulation but interrupted with bleeding complication.   Hyperlipidemia    Hypertension    Incontinence of urine    SOME INCONTINENCE   Left adrenal mass ([place]) 09/11/2013   Meningioma ([place])    Stroke ([place])    Cerebellar, 2013; WALKS WITH WALKER, ABLE TO DRESS AND BATHE HIMSELF BUT FAMILY TRIES TO PROVIDE SUPERVISION BECAUSE  OF HIS HX OF FALL AND WEAKNESS LEGS, ARMS    Thrombocytopenia ([place])    Vitreous hemorrhage ([place]) 12/21/2013   OS 12/18/13 CT /MRI brain done for ataxia     Family History:  Mother: diabetes, heart disease, HTN Father: heart disease, HTN Daughter: HTN, thyroid disease  Social History: Mr. Leff lives in with his daughter [name] and nephew. He does not use alcohol or drugs, nor does he smoke. He enjoys sitting out on his front porch for a few hours each day.  Review of Systems: A complete ROS was negative except as per HPI.   CBC    Component Value Date/Time   WBC 16.4 (H) 10/14/2020 1825   RBC 3.96 (L) 10/14/2020 1825   HGB 11.1 (L) 10/14/2020 1825   HGB 11.7 (L) 10/07/2020 1110   HCT 36.1 (L) 10/14/2020 1825   HCT 36.8 (L) 10/07/2020 1110   PLT 148 (L) 10/14/2020 1825   PLT 168 10/07/2020 1110   MCV 91.2 10/14/2020 1825   MCV 88 10/07/2020 1110   MCH 28.0 10/14/2020 1825   MCHC 30.7 10/14/2020 1825   RDW 14.9 10/14/2020 1825   RDW 13.8 10/07/2020 1110   LYMPHSABS 0.4 (L) 10/14/2020 1825   MONOABS 1.0 10/14/2020 1825   EOSABS 0.0 10/14/2020 1825   BASOSABS 0.0 10/14/2020 1825   CMP     Component Value Date/Time   NA 138 10/14/2020 1825   NA 144 09/16/2019 1520   K 3.5 10/14/2020 1825   CL 104 10/14/2020 1825   CO2 24 10/14/2020 1825   GLUCOSE 140 (H) 10/14/2020 1825   BUN 25 (H) 10/14/2020 1825   BUN 21 09/16/2019 1520   CREATININE 1.41 (H) 10/14/2020 1825   CREATININE 0.96 01/15/2014 1648   CALCIUM 9.5 10/14/2020 1825   PROT 6.5 10/14/2020 1825   ALBUMIN 3.2 (L) 10/14/2020 1825   AST 21 10/14/2020 1825   ALT 11 10/14/2020 1825   ALKPHOS 58 10/14/2020 1825   BILITOT 0.7 10/14/2020 1825   GFRNONAA 46 (L) 10/14/2020 1825   GFRNONAA 71 01/15/2014 1648   GFRAA 57 (L) 09/16/2019 1520   GFRAA 82 01/15/2014 1648   Urinalysis    Component Value Date/Time   COLORURINE STRAW (A) 10/15/2020 0024   APPEARANCEUR CLEAR 10/15/2020 0024   APPEARANCEUR Cloudy (A)  02/13/2017 1654   LABSPEC 1.010 10/15/2020 0024   PHURINE 7.0 10/15/2020 0024   GLUCOSEU NEGATIVE 10/15/2020 0024   HGBUR SMALL (A) 10/15/2020 0024   BILIRUBINUR NEGATIVE 10/15/2020 0024   BILIRUBINUR Negative 02/13/2017 1654   KETONESUR NEGATIVE 10/15/2020 0024   PROTEINUR NEGATIVE 10/15/2020 0024   UROBILINOGEN 1.0 07/09/2014 1503   NITRITE NEGATIVE 10/15/2020 0024   LEUKOCYTESUR LARGE (A) 10/15/2020 0024    Lactic Acid, Venous  Component Value Date/Time   LATICACIDVEN 1.8 10/14/2020 2120     Physical Exam: Blood pressure (!) 140/112, pulse 91, temperature (!) 103.2 F (39.6 C), temperature source Oral, resp. rate (!) 21, SpO2 93 %. Physical Exam Vitals and nursing note reviewed.  Constitutional:      General: He is awake.     Appearance: Normal appearance.  HENT:     Ears:     Comments: Patient has hearing deficit bilaterally Cardiovascular:     Rate and Rhythm: Normal rate and regular rhythm.     Heart sounds: Normal heart sounds.  Pulmonary:     Effort: Pulmonary effort is normal.     Breath sounds: Normal air entry. Examination of the right-upper field reveals rhonchi. Examination of the left-upper field reveals rhonchi. Examination of the right-middle field reveals rhonchi. Examination of the left-middle field reveals rhonchi. Examination of the right-lower field reveals rhonchi. Examination of the left-lower field reveals rhonchi. Rhonchi present.  Abdominal:     Palpations: Abdomen is soft.     Tenderness: There is no abdominal tenderness. There is no right CVA tenderness or left CVA tenderness.  Musculoskeletal:     Right lower leg: No edema.     Left lower leg: No edema.  Skin:    General: Skin is warm and dry.  Neurological:     General: No focal deficit present.     Mental Status: He is alert. Mental status is at baseline.  Psychiatric:        Behavior: Behavior is cooperative.     EKG: personally reviewed my interpretation is sinus tachycardia with  PVCs, noted LBBB.  CXR: personally reviewed my interpretation is cardiomegaly with decreased lung volumes, largely unchanged from last CXR in January 2022.  Assessment & Plan by Problem:  Mr. Ochsner is a 87 y.o. male from [place], [place] who is followed by Dr. Heber [place], with past medical history of BPH, CVA, CKD, HLD, HTN, incontinence, and GI bleed who presents because of confusion and is admitted for sepsis.  #Sepsis Patient presents with one day of confusion and tremors and found to have a fever of 103.2, tachypneic ranging 21-30, elevated WBC of 16.4, and hypertensive with most recent reading of 140/112, lactic acid of 2.3 that corrected to 1.8 with fluids.. No urinary symptoms endorsed by he or his daughter; UA showed large leukocytes, 11-20 WBC, small Hgb.  - Blood culture pending - Urine culture pending - Received 3, 1L LR boluses in ED - Received metronidazole 500 mg IV and vancomycin 1750 mg IV in ED - LR 150 mL/hr initiated  - Will initiate cefepime 2 g IV twice daily and vancomycin 1,000 mg daily  #CKD stage III Patient with history of CKD III, last GFR of 59 and BUN/Cr of 21/1.16 in 07/2020 down to 46 on admission with BUN/Cr 25/1.31 concerning for AKI on CKD. - Continue LR 150 mL/hr  #Hyperlipidemia Has been stable on atorvastatin 20 mg daily; will continue.  #Hypertension Given acute sepsis and AKI on CKD III, will hold home antihypertensives to optimize MAP and decrease risk of worsening AKI.  DVT prophylaxis: heparin 5,000 units every 8 hours  Dispo: Admit patient to Inpatient with expected length of stay greater than 2 midnights.  SignedFarrel Gordon, DO 10/15/2020, 12:56 AM  Pager: [phone] After 5pm on weekdays and 1pm on weekends: On Call pager: [phone]

## 2020-10-14 NOTE — Progress Notes (Signed)
Pharmacy Antibiotic Note  [name] is a 87 y.o. male admitted on 10/14/2020 with sepsis.  Pharmacy has been consulted for vancomycin and cefepime dosing.  Plan: Vancomycin '1750mg'$  x1 then '1000mg'$  IV q12h (eAUC 517, Cr 1.'41mg'$ /dL) Cefepime 2g IV q12h -Monitor renal function, clinical status, and antibiotic plan -Order vancomycin level as necessary  Temp (24hrs), Avg:103.2 F (39.6 C), Min:103.2 F (39.6 C), Max:103.2 F (39.6 C)  Recent Labs  Lab 10/14/20 1825 10/14/20 1826  WBC 16.4*  --   CREATININE 1.41*  --   LATICACIDVEN  --  2.3*    Estimated Creatinine Clearance: 35.4 mL/min (A) (by C-G formula based on SCr of 1.41 mg/dL (H)).    No Known Allergies  Antimicrobials this admission: Vanc 8/4 >>  Cefepime 8/4 >>  Flagyl x1 dose  Dose adjustments this admission: N/A  Microbiology results: 8/4 BCx:  8/4 UCx:   Thank you for allowing pharmacy to be a part of this patient's care.  Joetta Manners, PharmD, [facility] Emergency Medicine Clinical Pharmacist ED RPh Phone: [place]: [phone]

## 2020-10-15 DIAGNOSIS — N1831 Chronic kidney disease, stage 3a: Secondary | ICD-10-CM

## 2020-10-15 DIAGNOSIS — A419 Sepsis, unspecified organism: Secondary | ICD-10-CM

## 2020-10-15 LAB — BASIC METABOLIC PANEL
Anion gap: 9 (ref 5–15)
BUN: 18 mg/dL (ref 8–23)
CO2: 25 mmol/L (ref 22–32)
Calcium: 9.6 mg/dL (ref 8.9–10.3)
Chloride: 105 mmol/L (ref 98–111)
Creatinine, Ser: 1.24 mg/dL (ref 0.61–1.24)
GFR, Estimated: 54 mL/min — ABNORMAL LOW (ref >60)
Glucose, Bld: 115 mg/dL — ABNORMAL HIGH (ref 70–99)
Potassium: 3.8 mmol/L (ref 3.5–5.1)
Sodium: 139 mmol/L (ref 135–145)

## 2020-10-15 LAB — URINALYSIS, ROUTINE W REFLEX MICROSCOPIC
Bilirubin Urine: NEGATIVE
Glucose, UA: NEGATIVE mg/dL
Ketones, ur: NEGATIVE mg/dL
Nitrite: NEGATIVE
Protein, ur: NEGATIVE mg/dL
Specific Gravity, Urine: 1.01 (ref 1.005–1.030)
pH: 7 (ref 5.0–8.0)

## 2020-10-15 LAB — CBC
HCT: 34.4 % — ABNORMAL LOW (ref 39.0–52.0)
Hemoglobin: 10.9 g/dL — ABNORMAL LOW (ref 13.0–17.0)
MCH: 28.8 pg (ref 26.0–34.0)
MCHC: 31.7 g/dL (ref 30.0–36.0)
MCV: 91 fL (ref 80.0–100.0)
Platelets: 120 10*3/uL — ABNORMAL LOW (ref 150–400)
RBC: 3.78 MIL/uL — ABNORMAL LOW (ref 4.22–5.81)
RDW: 14.9 % (ref 11.5–15.5)
WBC: 20 10*3/uL — ABNORMAL HIGH (ref 4.0–10.5)
nRBC: 0 % (ref 0.0–0.2)

## 2020-10-15 MED ORDER — TRIAMCINOLONE ACETONIDE 0.1 % EX OINT: 1.0000 "application " | TOPICAL_OINTMENT | Freq: Two times a day (BID) | CUTANEOUS | Status: DC | PRN

## 2020-10-15 MED ORDER — HEPARIN SODIUM (PORCINE) 5000 UNIT/ML IJ SOLN
5000.0000 [IU] | Freq: Three times a day (TID) | INTRAMUSCULAR | Status: DC
  Administered 2020-10-15 – 2020-10-17 (×8): 5000 [IU] via SUBCUTANEOUS
  Filled 2020-10-15 (×7): qty 1

## 2020-10-15 MED ORDER — ATORVASTATIN CALCIUM 10 MG PO TABS
20.0000 mg | ORAL_TABLET | Freq: Every day | ORAL | Status: DC
  Administered 2020-10-15 – 2020-10-17 (×3): 20 mg via ORAL
  Filled 2020-10-15 (×3): qty 2

## 2020-10-15 MED ORDER — ACETAMINOPHEN 325 MG PO TABS: 650.0000 mg | ORAL_TABLET | Freq: Four times a day (QID) | ORAL | Status: DC | PRN

## 2020-10-15 MED ORDER — POTASSIUM CHLORIDE CRYS ER 20 MEQ PO TBCR
40.0000 meq | EXTENDED_RELEASE_TABLET | Freq: Once | ORAL | Status: AC
  Administered 2020-10-15: 40 meq via ORAL
  Filled 2020-10-15: qty 2

## 2020-10-15 MED ORDER — ASPIRIN EC 81 MG PO TBEC
81.0000 mg | DELAYED_RELEASE_TABLET | Freq: Every day | ORAL | Status: DC
  Administered 2020-10-15 – 2020-10-17 (×3): 81 mg via ORAL
  Filled 2020-10-15 (×3): qty 1

## 2020-10-15 NOTE — ED Notes (Signed)
Pt accidentally pulls out IV from right hand. Pt also saturated with urine s/p condom cath bag becoming dislodged. Pt cleaned, sheets changed, placed on male Purewick 

## 2020-10-15 NOTE — Progress Notes (Addendum)
Subjective:  Patient seen at bedside. Daughter present with the patient. Patient has hearing difficulty but gave answers with help from his daughter. She states the patient is improved since admission as he appears more calmer. Patient denied diarrhea and wears pads that need to be changed. Did cough a few times but daughter states he does this at baseline to clear his throat.   Objective:  Vital signs in last 24 hours: Vitals:   10/15/20 0800 10/15/20 0815 10/15/20 1130 10/15/20 1345  BP: 136/80 136/81 106/72 101/66  Pulse: 71 67 68 71  Resp: 16  16 (!) 22  Temp:      TempSrc:      SpO2: 95% 94% 96% 96%   Physical Exam Constitutional:      General: He is not in acute distress.    Appearance: Normal appearance. He is not ill-appearing.  HENT:     Head: Normocephalic and atraumatic.     Right Ear: External ear normal.     Left Ear: External ear normal.     Ears:     Comments: Pt wears hearing aid    Mouth/Throat:     Mouth: Mucous membranes are moist.     Pharynx: Oropharynx is clear. No posterior oropharyngeal erythema.  Eyes:     Extraocular Movements: Extraocular movements intact.     Pupils: Pupils are equal, round, and reactive to light.  Cardiovascular:     Rate and Rhythm: Normal rate and regular rhythm.     Pulses: Normal pulses.     Heart sounds: Normal heart sounds.  Pulmonary:     Effort: Pulmonary effort is normal.     Breath sounds: Normal breath sounds.  Abdominal:     General: Bowel sounds are normal.     Palpations: Abdomen is soft.  Musculoskeletal:        General: No swelling. Normal range of motion.  Skin:    General: Skin is warm and dry.     Capillary Refill: Capillary refill takes less than 2 seconds.  Neurological:     Mental Status: Mental status is at baseline.     Motor: No weakness.     Comments: Oriented to self, surrounding, but not year but at baseline per daughter     Assessment/Plan:  Active Problems:   CKD (chronic  kidney disease) stage 3, GFR 30-59 ml/min (HCC)   Sepsis ([place])  Sepsis Patient presents with one day of confusion and tremors and found to have a fever of 103.2, tachypneic ranging 21-30, elevated WBC of 16.4, and hypertensive with most recent reading of 140/112, lactic acid of 2.3 that corrected to 1.8 with fluids.. No urinary symptoms endorsed by he or his daughter; UA showed large leukocytes, 11-20 WBC, small Hgb. Respiratory panel negative. Repeat CBC showed WBC of 20. Appears to be urinary in nature due to diarrhea history and wearing of pad.   Plan: - Blood culture pending - Urine culture pending -Will continue cefepime 2 g IV twice daily and vancomycin 1,000 mg daily  AKI on CKD stage III (resolved) Patient with history of CKD III, last GFR of 59 and BUN/Cr of 21/1.16 in 07/2020 down to 46 on admission with BUN/Cr 25/1.83 concerning for AKI on CKD. Current creatinine 1.24.  Plan: - Continue to monitor  Hyperlipidemia Patient has hyperlipidemia well controlled with 20 mg daily.  Plan: -Has been stable on atorvastatin 20 mg daily.   Hypertension Patient has hypertension that is well controlled with  Norvasc 5 mg.   Plan: Given acute sepsis and AKI on CKD III will hold BP med to optimize MAP and decrease risk of worsening AKI. -Continue to monitor  Prior to Admission Living Arrangement: Anticipated Discharge Location: Barriers to Discharge: Dispo: Anticipated discharge in approximately 3 day(s).   Idamae Schuller, MD 10/15/2020, 4:02 PM Pager: [phone] After 5pm on weekdays and 1pm on weekends: On Call pager [phone]

## 2020-10-16 DIAGNOSIS — A419 Sepsis, unspecified organism: Secondary | ICD-10-CM | POA: Diagnosis not present

## 2020-10-16 DIAGNOSIS — N183 Chronic kidney disease, stage 3 unspecified: Secondary | ICD-10-CM | POA: Diagnosis not present

## 2020-10-16 LAB — URINE CULTURE: Culture: NO GROWTH

## 2020-10-16 LAB — CBC WITH DIFFERENTIAL/PLATELET
Abs Immature Granulocytes: 0.05 10*3/uL (ref 0.00–0.07)
Basophils Absolute: 0 10*3/uL (ref 0.0–0.1)
Basophils Relative: 0 %
Eosinophils Absolute: 0.3 10*3/uL (ref 0.0–0.5)
Eosinophils Relative: 2 %
HCT: 31.7 % — ABNORMAL LOW (ref 39.0–52.0)
Hemoglobin: 10 g/dL — ABNORMAL LOW (ref 13.0–17.0)
Immature Granulocytes: 0 %
Lymphocytes Relative: 10 %
Lymphs Abs: 1.3 10*3/uL (ref 0.7–4.0)
MCH: 28.5 pg (ref 26.0–34.0)
MCHC: 31.5 g/dL (ref 30.0–36.0)
MCV: 90.3 fL (ref 80.0–100.0)
Monocytes Absolute: 0.9 10*3/uL (ref 0.1–1.0)
Monocytes Relative: 7 %
Neutro Abs: 10.5 10*3/uL — ABNORMAL HIGH (ref 1.7–7.7)
Neutrophils Relative %: 81 %
Platelets: 132 10*3/uL — ABNORMAL LOW (ref 150–400)
RBC: 3.51 MIL/uL — ABNORMAL LOW (ref 4.22–5.81)
RDW: 15 % (ref 11.5–15.5)
WBC: 13 10*3/uL — ABNORMAL HIGH (ref 4.0–10.5)
nRBC: 0 % (ref 0.0–0.2)

## 2020-10-16 LAB — BASIC METABOLIC PANEL
Anion gap: 7 (ref 5–15)
BUN: 19 mg/dL (ref 8–23)
CO2: 25 mmol/L (ref 22–32)
Calcium: 9.5 mg/dL (ref 8.9–10.3)
Chloride: 103 mmol/L (ref 98–111)
Creatinine, Ser: 1.27 mg/dL — ABNORMAL HIGH (ref 0.61–1.24)
GFR, Estimated: 53 mL/min — ABNORMAL LOW (ref >60)
Glucose, Bld: 115 mg/dL — ABNORMAL HIGH (ref 70–99)
Potassium: 3.7 mmol/L (ref 3.5–5.1)
Sodium: 135 mmol/L (ref 135–145)

## 2020-10-16 MED ORDER — SODIUM CHLORIDE 0.9 % IV SOLN: 2.0000 g | INTRAVENOUS | Status: DC

## 2020-10-16 MED ORDER — SODIUM CHLORIDE 0.9 % IV SOLN
1.0000 g | INTRAVENOUS | Status: DC
  Administered 2020-10-16: 1 g via INTRAVENOUS
  Filled 2020-10-16: qty 10

## 2020-10-16 NOTE — Evaluation (Signed)
Physical Therapy Evaluation Patient Details Name: [name] MRN: [id number] DOB: January 15, 1925 Today's Date: 10/16/2020   History of Present Illness  Pt is a 87 y.o. male who presented 10/14/20 with AMS. He was admitted with sepsis, most likely from UTI. PMH: vascular dementia, hearing loss 2/2 previous CVA, CKD, BPH, HLD, HTN, DVT, meningioma   Clinical Impression  Pt presents with condition above and deficits mentioned below, see PT Problem List. PTA, he was living with his daughter, who provides 24/7 care, in a 1-level house with a ramped entrance. PTA, he was needing assistance for bed mobility and transfers but was supervision level for household distance gait bouts with a RW. Currently, pt is requiring modA to transfer to stand, maxA to pivot, and minA to ambulate ~30 ft with a RW before fatiguing. He displays deficits in coordination, strength, balance, activity tolerance, and lower extremity ROM that place him at risk for falls. Recommend pt follow-up with HHPT. Will continue to follow acutely.    Follow Up Recommendations Home health PT;Supervision/Assistance - 24 hour    Equipment Recommendations  None recommended by PT (has all needed equipment)    Recommendations for Other Services       Precautions / Restrictions Precautions Precautions: Fall Precaution Comments: very HOH Restrictions Weight Bearing Restrictions: No      Mobility  Bed Mobility Overal bed mobility: Needs Assistance Bed Mobility: Supine to Sit     Supine to sit: Min assist     General bed mobility comments: MinA to bring legs off EOB and initiate transition to sit EOB.    Transfers Overall transfer level: Needs assistance Equipment used: Rolling walker (2 wheeled) Transfers: Sit to/from [name] Sit to Stand: Mod assist Stand pivot transfers: Max assist       General transfer comment: ModA to power up and decrease pt's posterior bias with coming to stand, 1x from EOB and  1x from recliner. Pt attempting to sit prematurely with stand pivot to R bed > recliner, needing maxA to direct buttocks safely to recliner.  Ambulation/Gait Ambulation/Gait assistance: Min assist Gait Distance (Feet): 30 Feet Assistive device: Rolling walker (2 wheeled) Gait Pattern/deviations: Step-through pattern;Shuffle;Decreased stride length;Decreased dorsiflexion - right;Decreased dorsiflexion - left;Trunk flexed Gait velocity: reduced Gait velocity interpretation: <1.31 ft/sec, indicative of household ambulator General Gait Details: Pt taking small, shuffling steps with kyphotic posture at slow pace. MinA to steady, no overt LOB. Difficulty sequencing RW when turning.  Stairs            Wheelchair Mobility    Modified Rankin (Stroke Patients Only)       Balance Overall balance assessment: Needs assistance Sitting-balance support: Bilateral upper extremity supported;Feet supported Sitting balance-Leahy Scale: Poor Sitting balance - Comments: Min guard assist for static sitting EOB. Postural control: Posterior lean Standing balance support: Bilateral upper extremity supported;During functional activity Standing balance-Leahy Scale: Poor Standing balance comment: Reliant on UE support and physical assistance.                             Pertinent Vitals/Pain Pain Assessment: Faces Faces Pain Scale: No hurt Pain Intervention(s): Monitored during session    Home Living Family/patient expects to be discharged to:: Private residence Living Arrangements: Children Available Help at Discharge: Family;Available 24 hours/day Type of Home: House Home Access: Ramped entrance     Home Layout: One level Home Equipment: Walker - 2 wheels;Shower seat;Wheelchair - manual;Hospital bed;Grab bars - tub/shower;Bedside commode  Prior Function Level of Independence: Needs assistance   Gait / Transfers Assistance Needed: Pt is able to walk in house using RW using  supervision.  Daughter reports min A to get OOB and stand. Daughter reports pt pushes his chair against a wall to block him when he stands since he tends to lean posteriorly.  ADL's / Homemaking Assistance Needed: Daughter helped with washing his posterior aspect. Daughter provided set-up for dressing.  Comments: Used to work on railroad.     Hand Dominance   Dominant Hand: Right    Extremity/Trunk Assessment   Upper Extremity Assessment Upper Extremity Assessment: Defer to OT evaluation    Lower Extremity Assessment Lower Extremity Assessment: Generalized weakness    Cervical / Trunk Assessment Cervical / Trunk Assessment: Kyphotic  Communication   Communication: HOH;Expressive difficulties (hx of slurred speech per daughter from prior CVA)  Cognition Arousal/Alertness: Awake/alert Behavior During Therapy: WFL for tasks assessed/performed Overall Cognitive Status: Difficult to assess                                 General Comments: Difficult to assess cognition due to pt being very HOH, unsure whether he was answering questions incorrectly vs could not hear the questions. Pt able to state his name, DOB, and location but unaware of situation and current date.      General Comments      Exercises     Assessment/Plan    PT Assessment Patient needs continued PT services  PT Problem List Decreased strength;Decreased range of motion;Decreased activity tolerance;Decreased balance;Decreased mobility;Decreased coordination;Decreased cognition;Decreased knowledge of use of DME;Decreased safety awareness       PT Treatment Interventions DME instruction;Gait training;Functional mobility training;Therapeutic activities;Therapeutic exercise;Balance training;Neuromuscular re-education;Cognitive remediation;Patient/family education;Wheelchair mobility training    PT Goals (Current goals can be found in the Care Plan section)  Acute Rehab PT Goals Patient Stated Goal:  to get to chair PT Goal Formulation: With patient/family Time For Goal Achievement: 10/30/20 Potential to Achieve Goals: Good    Frequency Min 3X/week   Barriers to discharge        Co-evaluation               AM-PAC PT "6 Clicks" Mobility  Outcome Measure Help needed turning from your back to your side while in a flat bed without using bedrails?: A Little Help needed moving from lying on your back to sitting on the side of a flat bed without using bedrails?: A Little Help needed moving to and from a bed to a chair (including a wheelchair)?: A Lot Help needed standing up from a chair using your arms (e.g., wheelchair or bedside chair)?: A Lot Help needed to walk in hospital room?: A Little Help needed climbing 3-5 steps with a railing? : Total 6 Click Score: 14    End of Session Equipment Utilized During Treatment: Gait belt Activity Tolerance: Patient tolerated treatment well Patient left: in chair;with call bell/phone within reach;with chair alarm set Nurse Communication: Mobility status PT Visit Diagnosis: Unsteadiness on feet (R26.81);Other abnormalities of gait and mobility (R26.89);Muscle weakness (generalized) (M62.81);Difficulty in walking, not elsewhere classified (R26.2)    Time: [id number] PT Time Calculation (min) (ACUTE ONLY): 43 min   Charges:   PT Evaluation $PT Eval Moderate Complexity: 1 Mod PT Treatments $Gait Training: 8-22 mins $Therapeutic Activity: 8-22 mins        Moishe Spice, PT, DPT Acute Rehabilitation Services  Pager: [phone]  Office: [place] 10/16/2020, 1:25 PM

## 2020-10-16 NOTE — Evaluation (Signed)
Occupational Therapy Evaluation Patient Details Name: [name] MRN: [id number] DOB: [birth year] Today's Date: 10/16/2020    History of Present Illness Pt is a 87 y.o. male who presented 10/14/20 with AMS. He was admitted with sepsis, most likely from UTI. PMH: vascular dementia, hearing loss 2/2 previous CVA, CKD, BPH, HLD, HTN, DVT, meningioma   Clinical Impression   Pt presented in chair and agreed to session. Pt attempted to complete transfers with moderate assist with 3 trials and then reported the floor is to slick even with grip socks placement. Pt has a significant posterior tilt even with cues to reposition pt. Pt then following attempts reported they did not want to complete anything further at this time. Pt currently with functional limitations due to the deficits listed below (see OT Problem List).  Pt will benefit from skilled OT to increase their safety and independence with ADL and functional mobility for ADL to facilitate discharge to venue listed below.      Follow Up Recommendations  Home health OT;Supervision/Assistance - 24 hour    Equipment Recommendations       Recommendations for Other Services       Precautions / Restrictions Precautions Precautions: Fall Precaution Comments: very HOH Restrictions Weight Bearing Restrictions: No      Mobility Bed Mobility Overal bed mobility:  (presented in chair) Bed Mobility: Supine to Sit     Supine to sit: Min assist     General bed mobility comments: MinA to bring legs off EOB and initiate transition to sit EOB.    Transfers Overall transfer level: Needs assistance Equipment used: Rolling walker (2 wheeled) Transfers: Sit to/from Stand Sit to Stand: Mod assist Stand pivot transfers: Max assist       General transfer comment: pt required multiple attempts and max cues for positioning, pt reported the floor is slick and decline further attempts even with 3 attempts    Balance Overall balance  assessment: Needs assistance Sitting-balance support: Bilateral upper extremity supported;Feet supported Sitting balance-Leahy Scale: Poor Sitting balance - Comments: posterior tilt Postural control: Posterior lean Standing balance support: Bilateral upper extremity supported;During functional activity Standing balance-Leahy Scale: Poor Standing balance comment: Reliant on UE support and physical assistance.                           ADL either performed or assessed with clinical judgement   ADL Overall ADL's : Needs assistance/impaired Eating/Feeding: Modified independent;Sitting   Grooming: Wash/dry hands;Wash/dry face;Set up;Sitting;Cueing for safety;Cueing for sequencing   Upper Body Bathing: Min guard;Sitting       Upper Body Dressing : Min guard;Sitting                           Vision         Perception     Praxis      Pertinent Vitals/Pain Pain Assessment: Faces Faces Pain Scale: No hurt Pain Intervention(s): Monitored during session     Hand Dominance Right   Extremity/Trunk Assessment Upper Extremity Assessment Upper Extremity Assessment: RUE deficits/detail;LUE deficits/detail RUE Deficits / Details: max ROM about to 90 degrees of shoulder flexion LUE Deficits / Details: max ROM about to 90 degrees of shoulder flexion   Lower Extremity Assessment Lower Extremity Assessment: Generalized weakness   Cervical / Trunk Assessment Cervical / Trunk Assessment: Kyphotic   Communication Communication Communication: HOH;Expressive difficulties   Cognition Arousal/Alertness: Awake/alert Behavior During Therapy: WFL for  tasks assessed/performed Overall Cognitive Status: Difficult to assess                                 General Comments: Difficult to assess cognition due to pt being very HOH, unsure whether he was answering questions incorrectly vs could not hear the questions. Pt able to state his name, DOB, and location  but unaware of situation and current date.   General Comments       Exercises     Shoulder Instructions      Home Living Family/patient expects to be discharged to:: Private residence Living Arrangements: Children Available Help at Discharge: Family;Available 24 hours/day Type of Home: House Home Access: Ramped entrance     Home Layout: One level     Bathroom Shower/Tub: Teacher, early years/pre: Standard Bathroom Accessibility: Yes   Home Equipment: Environmental consultant - 2 wheels;Shower seat;Wheelchair - manual;Hospital bed;Grab bars - tub/shower;Bedside commode   Additional Comments: per daughter from chart with PT      Prior Functioning/Environment Level of Independence: Needs assistance  Gait / Transfers Assistance Needed: Pt is able to walk in house using RW using supervision.  Daughter reports min A to get OOB and stand. Daughter reports pt pushes his chair against a wall to block him when he stands since he tends to lean posteriorly. ADL's / Homemaking Assistance Needed: Daughter helped with washing his posterior aspect. Daughter provided set-up for dressing. Communication / Swallowing Assistance Needed: Pt able to cut own food and feed self. Comments: Used to work on railroad.        OT Problem List: Decreased strength;Decreased range of motion;Decreased activity tolerance;Impaired balance (sitting and/or standing);Decreased safety awareness;Decreased knowledge of use of DME or AE      OT Treatment/Interventions: Self-care/ADL training;Therapeutic exercise;DME and/or AE instruction;Therapeutic activities;Cognitive remediation/compensation;Visual/perceptual remediation/compensation;Patient/family education;Balance training    OT Goals(Current goals can be found in the care plan section) Acute Rehab OT Goals Patient Stated Goal: to eat more OT Goal Formulation: With patient Time For Goal Achievement: 10/30/20 Potential to Achieve Goals: Good ADL Goals Pt Will  Perform Upper Body Bathing: sitting;with min guard assist Pt Will Perform Lower Body Bathing: with mod assist;sit to/from stand Pt Will Perform Tub/Shower Transfer: with min assist;ambulating;shower seat  OT Frequency: Min 2X/week   Barriers to D/C:            Co-evaluation              AM-PAC OT "6 Clicks" Daily Activity     Outcome Measure Help from another person eating meals?: None Help from another person taking care of personal grooming?: A Little Help from another person toileting, which includes using toliet, bedpan, or urinal?: A Lot Help from another person bathing (including washing, rinsing, drying)?: A Lot Help from another person to put on and taking off regular upper body clothing?: A Little Help from another person to put on and taking off regular lower body clothing?: A Lot 6 Click Score: 16   End of Session Equipment Utilized During Treatment: Gait belt;Rolling walker  Activity Tolerance: Other (comment) (Pt reported they wanted to eat their cake and did not want to complete anything further) Patient left: in chair;with call bell/phone within reach;with chair alarm set  OT Visit Diagnosis: Unsteadiness on feet (R26.81);Other abnormalities of gait and mobility (R26.89);Muscle weakness (generalized) (M62.81)  [id number] OT Time Calculation (min): 14 min Charges:  OT General Charges $OT Visit: 1 Visit OT Evaluation $OT Eval Low Complexity: [place] OTR/L  Acute Rehab Services  [phone] office number [phone] pager number   Joeseph Amor 10/16/2020, 2:11 PM

## 2020-10-16 NOTE — Progress Notes (Signed)
   Subjective:  Patient evaluated at bedside this AM. States he is doing well, denies fevers or dysuria. Also denies dyspnea, abdominal pain, changes in chronic cough.   Objective:  Vital signs in last 24 hours: Vitals:   10/15/20 1951 10/16/20 0107 10/16/20 0205 10/16/20 0459  BP: 125/65 120/66  137/83  Pulse: 81 76  73  Resp: '16 17  16  '$ Temp: 98.6 F (37 C) 98.5 F (36.9 C)  98.7 F (37.1 C)  TempSrc: Oral Oral  Oral  SpO2: 97% 95%  95%  Weight:   89.2 kg    General: Resting comfortably in bed in no acute distress CV: Regular rate, rhythm. No murmurs, rubs, gallops appreciated Pulm: Normal work of breathing on room air. Clear to auscultation bilaterally. GI: Abdomen soft, non-tender, non-distended.  MSK: Normal bulk, tone. No pitting edema bilaterally. Skin: Patches of hypopigmentation throughout lower extremities. No open wounds appreciated Neuro: Awake, alert.  Assessment/Plan:  [name] is 87yo person with vascular dementia, hearing loss 2/2 previous CVA, CKD, BPH, HLD, HTN admitted 8/4 with sepsis of unknown origin, now thought most likely from urine source.  Active Problems:   CKD (chronic kidney disease) stage 3, GFR 30-59 ml/min (HCC)   Sepsis ([place])  #Sepsis likely 2/2 urinary tract infection Patient initially arrived with altered mental status, febrile, found to have leukocytosis and elevated lactic acid. UA with large leukocytes. BCx negative thus far. UCx pending. No signs of respiratory or GI infection. Per family, patient was back at baseline mental status yesterday afternoon. He appears to be much improved on exam this morning. WBC down-trending, no further fevers. Will de-escalate antibiotic regimen, unlikely cellulitis given no open wounds. In addition, will measure post-void residual given possible UTI in male in addition to history of BPH. - D/c cefepime, vancomycin - Start IV ceftriaxone 2g (day 3 of antibiotics) - Hopeful to further de-escalate  tomorrow to oral medications - Follow-up cultures - Measure post-void residual - PT/OT  #CKDIIIa Patient initially presented with mild AKI, now resolved after fluids. Patient passed bedside swallow test and has been able to tolerate diet. Will hold home lasix for now.  #Hypertension Holding home amlodipine due to acute infection.  Prior to Admission Living Arrangement: lives with daughter Anticipated Discharge Location: pending PT/OT Barriers to Discharge: medical management Dispo: Anticipated discharge in approximately 1-2 day(s).   Sanjuan Dame, MD 10/16/2020, 6:58 AM Pager: [phone] After 5pm on weekdays and 1pm on weekends: On Call pager [phone]

## 2020-10-17 LAB — CBC
HCT: 30.7 % — ABNORMAL LOW (ref 39.0–52.0)
Hemoglobin: 9.6 g/dL — ABNORMAL LOW (ref 13.0–17.0)
MCH: 28.2 pg (ref 26.0–34.0)
MCHC: 31.3 g/dL (ref 30.0–36.0)
MCV: 90 fL (ref 80.0–100.0)
Platelets: 131 10*3/uL — ABNORMAL LOW (ref 150–400)
RBC: 3.41 MIL/uL — ABNORMAL LOW (ref 4.22–5.81)
RDW: 15.3 % (ref 11.5–15.5)
WBC: 8.3 10*3/uL (ref 4.0–10.5)
nRBC: 0 % (ref 0.0–0.2)

## 2020-10-17 LAB — BASIC METABOLIC PANEL
Anion gap: 4 — ABNORMAL LOW (ref 5–15)
BUN: 15 mg/dL (ref 8–23)
CO2: 27 mmol/L (ref 22–32)
Calcium: 9.7 mg/dL (ref 8.9–10.3)
Chloride: 109 mmol/L (ref 98–111)
Creatinine, Ser: 1.15 mg/dL (ref 0.61–1.24)
GFR, Estimated: 59 mL/min — ABNORMAL LOW (ref >60)
Glucose, Bld: 123 mg/dL — ABNORMAL HIGH (ref 70–99)
Potassium: 3.6 mmol/L (ref 3.5–5.1)
Sodium: 140 mmol/L (ref 135–145)

## 2020-10-17 NOTE — Progress Notes (Signed)
Discharge instructions (including medications) discussed with and copy provided to patient & daughter. All belongings sent home with patient.

## 2020-10-17 NOTE — Progress Notes (Signed)
Occupational Therapy Treatment Patient Details Name: [name] MRN: [id number] DOB: 1924/10/13 Today's Date: 10/17/2020    History of present illness Pt is a 87 y.o. male who presented 10/14/20 with AMS. He was admitted with sepsis, most likely from UTI. PMH: vascular dementia, hearing loss 2/2 previous CVA, CKD, BPH, HLD, HTN, DVT, meningioma   OT comments  Pt progressing. He requires mod A for functional transfers/mobility due to heavy posterior lean and he has difficulty processing instruction due to severity of Hearing loss.  He is able to perform LB ADLs with mod A.  He will require 24 hour assist at discharge.    Follow Up Recommendations  Home health OT;Supervision/Assistance - 24 hour    Equipment Recommendations  None recommended by OT    Recommendations for Other Services      Precautions / Restrictions Precautions Precautions: Fall Precaution Comments: very HOH       Mobility Bed Mobility Overal bed mobility: Needs Assistance Bed Mobility: Supine to Sit;Sit to Supine     Supine to sit: Min assist     General bed mobility comments: assist to fully lift trunk and assist to scoot hips to EOB    Transfers                      Balance Overall balance assessment: Needs assistance Sitting-balance support: Single extremity supported Sitting balance-Leahy Scale: Poor Sitting balance - Comments: posterior bias.  Requires min guard assist and UE support Postural control: Posterior lean Standing balance support: Bilateral upper extremity supported;During functional activity Standing balance-Leahy Scale: Poor Standing balance comment: posterior bias and heavy reliance on UEs as well as mod A                           ADL either performed or assessed with clinical judgement   ADL Overall ADL's : Needs assistance/impaired             Lower Body Bathing: Moderate assistance;Sit to/from stand Lower Body Bathing Details (indicate cue type  and reason): Pt able to apply lotion to bil. LEs to simulate bathing. and to facilitate forward lean. Mod A to move sit to stand                     Functional mobility during ADLs: Moderate assistance;Rolling walker General ADL Comments: verbal cues for hand placement during transfer, and to shift weight anteriorly.     Vision       Perception     Praxis      Cognition Arousal/Alertness: Awake/alert Behavior During Therapy: WFL for tasks assessed/performed Overall Cognitive Status: Within Functional Limits for tasks assessed                                 General Comments: WFL for basic tasks        Exercises     Shoulder Instructions       General Comments RN present at end of session.  Informed her of transfer status, need for +2 assist and/or use of stedy    Pertinent Vitals/ Pain       Pain Assessment: No/denies pain  Home Living  Prior Functioning/Environment              Frequency  Min 2X/week        Progress Toward Goals  OT Goals(current goals can now be found in the care plan section)  Progress towards OT goals: Progressing toward goals     Plan Discharge plan remains appropriate    Co-evaluation                 AM-PAC OT "6 Clicks" Daily Activity     Outcome Measure   Help from another person eating meals?: None Help from another person taking care of personal grooming?: A Little Help from another person toileting, which includes using toliet, bedpan, or urinal?: A Lot Help from another person bathing (including washing, rinsing, drying)?: A Lot Help from another person to put on and taking off regular upper body clothing?: A Little Help from another person to put on and taking off regular lower body clothing?: A Lot 6 Click Score: 16    End of Session Equipment Utilized During Treatment: Gait belt;Rolling walker  OT Visit Diagnosis:  Unsteadiness on feet (R26.81);Other abnormalities of gait and mobility (R26.89);Muscle weakness (generalized) (M62.81)   Activity Tolerance Patient tolerated treatment well   Patient Left in chair;with call bell/phone within reach;with chair alarm set   Nurse Communication Mobility status;Need for lift equipment        Time: [id number] OT Time Calculation (min): 26 min  Charges: OT General Charges $OT Visit: 1 Visit OT Treatments $Self Care/Home Management : 8-22 mins $Therapeutic Activity: 8-22 mins  Nilsa Nutting., OTR/L Acute Rehabilitation Services Pager [phone] Office [phone]    Lucille Passy M 10/17/2020, 11:34 AM

## 2020-10-17 NOTE — Discharge Summary (Signed)
Name: [name] MRN: [id number] DOB: 1924/09/28 87 y.o. PCP: [name], DO  Date of Admission: 10/14/2020  4:56 PM Date of Discharge: 10/17/20 Attending Physician: Axel Filler, *  Discharge Diagnosis: 1. Sepsis 2. AKI on CKD 3. HLD 4. Hypertension  Discharge Medications: Allergies as of 10/17/2020   No Known Allergies      Medication List     TAKE these medications    amLODipine 5 MG tablet Commonly known as: NORVASC Take 1 tablet (5 mg total) by mouth daily.   aspirin 81 MG EC tablet Take 1 tablet (81 mg total) by mouth daily.   atorvastatin 20 MG tablet Commonly known as: LIPITOR Take 1 tablet (20 mg total) by mouth daily. What changed: when to take this   furosemide 40 MG tablet Commonly known as: LASIX Take 1 tablet (40 mg total) by mouth daily as needed for fluid. What changed: when to take this   triamcinolone ointment 0.1 % Commonly known as: KENALOG Apply 1 application topically 2 (two) times daily as needed (For leg rash).        Disposition and follow-up:   Mr.Miguel D Loving was discharged from [facility] in Good condition.  At the hospital follow up visit please address:  1. Resolution of AMS/Fever: Patient presented with AMS/Fever but no definite source was found. UTI was suspected with some leukocytes but culture grew no organism. 2. AKI: Patient had AKI when admitted. It resolved. Check if still at baseline. 3. Hypertension: Due to AKI, BP meds were stopped but then restarted. Please check if htn is well controlled again.    2.  Labs / imaging needed at time of follow-up: CBC, CMP  3.  Pending labs/ test needing follow-up: None  Follow-up Appointments: Internal Medicine Clinic will call with hospital follow up   Hospital Course by date: 10/14/20- Patient presented to ED with AMS, fever by EMS. Tmax of 103.2. Sepsis protocol initiated by ED. Vanc 1750 mg one time followed by 1000 mg IV q12 hr  ordered along with Cefepime 2 g IV q12 hr ordered. UA, Ucx, Bcx, lactic acid, CMP, CBC were ordered. Patient found to have AKI and fluids were given. Leukocytosis present. Patient admitted to inpatient service. Daughter present as patient is has difficulty hearing but understands daughter better. 10/15/20-10/16/20-Patient continued doing well. No events overnight. Cefepime and Vanc discontinued on 08/06 and Rocephin 2g started. AKI resolved.  10/17/20-No acute events overnight. Leukocytosis resolved. Urine culture and blood culture showed no growth. Patient assessed to be at baseline. Spoke with daughter. Patient deemed stable for discharge. Patient was discharged.    Discharge Subjective: Patient examined at bed side and doing well. Stated everything was going well. He slept well. Required repeating of question as patient has difficulty hearing. Daughter was not present at bedside this morning.  Discharge Exam:   BP (!) 147/86 (BP Location: Left Arm)   Pulse 72   Temp 98.1 F (36.7 C)   Resp 16   Wt 89 kg   SpO2 99%   BMI 28.98 kg/m  Discharge exam:  Physical Exam Constitutional:      General: He is not in acute distress.    Appearance: Normal appearance. He is not ill-appearing. HENT:    Head: Normocephalic and atraumatic.    Right Ear: External ear normal.    Left Ear: External ear normal.    Ears:    Comments: Pt wears hearing aid    Mouth/Throat:  Mouth: Mucous membranes are moist.    Pharynx: Oropharynx is clear. No posterior oropharyngeal erythema. Eyes:    Extraocular Movements: Extraocular movements intact.    Pupils: Pupils are equal, round, and reactive to light. Cardiovascular:    Rate and Rhythm: Normal rate and regular rhythm.    Pulses: Normal pulses.    Heart sounds: Normal heart sounds. Pulmonary:    Effort: Pulmonary effort is normal.    Breath sounds: Normal breath sounds. Abdominal:    General: Bowel sounds are normal.    Palpations: Abdomen is  soft. Musculoskeletal:        General: No swelling or tenderness. Normal range of motion.    Cervical back: Normal range of motion and neck supple. Skin:    General: Skin is warm and dry.    Capillary Refill: Capillary refill takes less than 2 seconds. Neurological:    Mental Status: Mental status is at baseline.    Motor: No weakness.    Comments: Oriented to self, surrounding, but not year but at baseline per daughter  Psychiatric:        Mood and Affect: Mood normal.        Behavior: Behavior normal.      Pertinent Labs, Studies, and Procedures:  CBC-At ED WBC was at 16.4 then 20.0 but currently at 8.3. UA-Large Leukocytes, with 11-20 WBC/hpf, rare bacteria present.  Ucx-no growth Bcx-no growth Lactic acid-2.3 on admission but later at 1.8 Chest X ray- no active disease process.  Discharge Instructions: Discharge Instructions     Call MD for:   Complete by: As directed    Call MD for:  difficulty breathing, headache or visual disturbances   Complete by: As directed    Call MD for:  extreme fatigue   Complete by: As directed    Call MD for:  hives   Complete by: As directed    Call MD for:  persistant dizziness or light-headedness   Complete by: As directed    Call MD for:  persistant nausea and vomiting   Complete by: As directed    Call MD for:  redness, tenderness, or signs of infection (pain, swelling, redness, odor or green/yellow discharge around incision site)   Complete by: As directed    Call MD for:  severe uncontrolled pain   Complete by: As directed    Call MD for:  temperature >100.4   Complete by: As directed    Diet - low sodium heart healthy   Complete by: As directed    Discharge instructions   Complete by: As directed    It was a pleasure taking care of you! You presented to the ED with fever, and altered mental status. We gave you short term antibiotics and you showed improvement. We did a urine culture and blood culture to see if you had any  infection but nothing grew back. It appears you either had a viral illness or an early bacterial illness that has resolved. We will discharge you without any antibiotics as you don't have any signs of infections. Your mental status was back to baseline and your fever had resolved. Our clinic will call you to schedule an appointment with you within 2 weeks of discharge date.   Increase activity slowly   Complete by: As directed        Signed: Idamae Schuller, MD 10/17/2020, 1:19 PM   Pager: [phone]

## 2020-10-19 ENCOUNTER — Telehealth: Payer: Self-pay | Admitting: Internal Medicine

## 2020-10-19 LAB — CULTURE, BLOOD (ROUTINE X 2)
Culture: NO GROWTH
Culture: NO GROWTH

## 2020-10-19 NOTE — Telephone Encounter (Signed)
TOC HFU APPT  Date: 10/25/2020 Status: Sch  Time: 10:45 AM Length: 30       Provider: Madalyn Rob, MD

## 2020-10-24 ENCOUNTER — Other Ambulatory Visit: Payer: Self-pay | Admitting: Internal Medicine

## 2020-10-24 NOTE — Progress Notes (Signed)
Internal Medicine Clinic Attending  I saw and evaluated the patient.  I personally confirmed the key portions of the history and exam documented by Dr.  Dean  and I reviewed pertinent patient test results.  The assessment, diagnosis, and plan were formulated together and I agree with the documentation in the resident's note.  

## 2020-10-25 ENCOUNTER — Ambulatory Visit (INDEPENDENT_AMBULATORY_CARE_PROVIDER_SITE_OTHER): Payer: Medicare HMO | Admitting: Student

## 2020-10-25 ENCOUNTER — Encounter: Payer: Self-pay | Admitting: Student

## 2020-10-25 ENCOUNTER — Other Ambulatory Visit: Payer: Self-pay

## 2020-10-25 VITALS — BP 150/86 | HR 64 | Temp 98.0°F | Ht 69.0 in | Wt 195.7 lb

## 2020-10-25 DIAGNOSIS — I10 Essential (primary) hypertension: Secondary | ICD-10-CM

## 2020-10-25 DIAGNOSIS — N183 Chronic kidney disease, stage 3 unspecified: Secondary | ICD-10-CM | POA: Diagnosis not present

## 2020-10-25 DIAGNOSIS — K5731 Diverticulosis of large intestine without perforation or abscess with bleeding: Secondary | ICD-10-CM

## 2020-10-25 DIAGNOSIS — D500 Iron deficiency anemia secondary to blood loss (chronic): Secondary | ICD-10-CM | POA: Diagnosis not present

## 2020-10-25 MED ORDER — POLYSACCHARIDE IRON COMPLEX 150 MG PO CAPS: 150.0000 mg | ORAL_CAPSULE | Freq: Every day | ORAL | 2 refills | Status: DC

## 2020-10-25 MED ORDER — POLYSACCHARIDE IRON COMPLEX 150 MG PO CAPS: 150.0000 mg | ORAL_CAPSULE | ORAL | 2 refills | Status: DC

## 2020-10-25 MED ORDER — AMLODIPINE BESYLATE 10 MG PO TABS: 10.0000 mg | ORAL_TABLET | Freq: Every day | ORAL | 11 refills | Status: DC

## 2020-10-25 NOTE — Assessment & Plan Note (Addendum)
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 He was admitted in May 2022 for hematochezia thought was due to diverticular bleed. Colonoscopy was not performed due to age and comorbidity. Pateint's daughter denied constipation, diarrhea, melena or hematochezia. Ferrous sulfate was started at last visit but patient never received the medication.   - Will start polysaccharide iron 150 mg every other day. - Recheck CBC and ferritin at next visit

## 2020-10-25 NOTE — Progress Notes (Signed)
   CC: Hospital follow-up  HPI:  [name] is a 87 y.o. with past medical history of hypertension, HFpEF, vascular dementia who presented to clinic today for hospital follow-up.  Please see problem based charting for detail  Past Medical History:  Diagnosis Date   Anemia    BPH (benign prostatic hypertrophy)    TURP 05/19/13   Cerebral embolism with cerebral infarction ([place]) 12/19/2013   Cerebrovascular accident (CVA) ([place]) 04/17/2019   Chronic kidney disease    CHRONIC KIDNEY DISEASE, 2   Difficulty hearing, right    BILATERAL HEARING LOSS - BEST TO TRY TO SPEAK INTO LEFT EAR   DVT (deep venous thrombosis) ([facility])    Frequent falls    History of DVT (deep vein thrombosis) 06/10/2013   Provoked s/p TURP. Dx per doppler 06/10/13. Complicated by hematuria while on anticoagulation s/p TURP. Initially on lovenox and coumadin, then stopped, then IVC filter placed 06/16/13. Anticoagulation restarted after hematuria stopped. Lovenox was discontinued apparently on 07/29/13 with comment on high risk for falls.   Total ~1.5 months of anticoagulation but interrupted with bleeding complication.   Hyperlipidemia    Hypertension    Incontinence of urine    SOME INCONTINENCE   Left adrenal mass ([place]) 09/11/2013   Meningioma ([place])    Stroke ([place])    Cerebellar, 2013; WALKS WITH WALKER, ABLE TO DRESS AND BATHE HIMSELF BUT FAMILY TRIES TO PROVIDE SUPERVISION BECAUSE OF HIS HX OF FALL AND WEAKNESS LEGS, ARMS    Thrombocytopenia ([place])    Vitreous hemorrhage ([place]) 12/21/2013   OS 12/18/13 CT /MRI brain done for ataxia    Review of Systems:   Per HPI  Physical Exam:  Vitals:   10/25/20 1110 10/25/20 1140  BP: (!) 149/85 (!) 150/86  Pulse: 68 64  Temp: 98 F (36.7 C)   TempSrc: Oral   SpO2: 95%   Weight: 195 lb 11.2 oz (88.8 kg)   Height: '5\' 9"'$  (1.753 m)    Physical Exam Constitutional:      General: He is not in acute distress.    Appearance: He is not toxic-appearing.  HENT:     Head:  Normocephalic.  Eyes:     Conjunctiva/sclera: Conjunctivae normal.  Cardiovascular:     Rate and Rhythm: Normal rate and regular rhythm.     Pulses: Normal pulses.     Heart sounds: Normal heart sounds.     Comments: No LE edema  Pulmonary:     Effort: Pulmonary effort is normal. No respiratory distress.     Breath sounds: Normal breath sounds. No wheezing.  Musculoskeletal:        General: Normal range of motion.     Cervical back: Normal range of motion.  Skin:    General: Skin is warm.     Coloration: Skin is not jaundiced.  Neurological:     Mental Status: He is alert.  Psychiatric:        Mood and Affect: Mood normal.        Behavior: Behavior normal.     Assessment & Plan:   See Encounters Tab for problem based charting.  Patient discussed with Dr. Philipp Ovens

## 2020-10-25 NOTE — Patient Instructions (Signed)
[name],  It was a pleasure seeing you in the clinic today.  Here is a summary what we talked about:  1.  High blood pressure: Your blood pressure is elevated today at 149/85.  Please measure your blood pressure at home and keep a log.  If his blood pressure is consistently less than 140/90, continue amlodipine 5 mg.  If his blood pressure is elevated more than 140/90, please start amlodipine 10 mg.  2.  Iron deficiency anemia: I sent a prescription of iron supplement to your pharmacy.  Please take 1 tablet every other day.  We will repeat at work at next visit.  Please let us know if patient starts to have blood in his stool.  Please return in 3 months,  Take care,  Dr. Alfonse Spruce

## 2020-10-25 NOTE — Assessment & Plan Note (Signed)
Patient was admitted in August 2022 for acute encephalopathy and fever, found to be in sepsis. No source of infection found. Urine culture and blood culture were unremarkable. He was started on Vanc and Cefepime and transitioned to Rocephin for total of 3 days. He also had an AKI which has resolved on discharge.   Patient reports feeling well after discharge. He denies fever, chill, pain, dysuria, constipation or diarrhea. He reports eating and drinking well. Patient can ambulate with a walker. Currently living with daughter. He has done well with PT in the past.   -F/u with PT/OT

## 2020-10-25 NOTE — Assessment & Plan Note (Signed)
Initial blood pressure 149/85, repeat 150/80. Patient is currently taking amlodipine 5 mg daily, did not take his med this morning. He is also taking lasix 40 mg PRN for extremity edema, last dose 4 days ago.  Goal BP for him is 140/90. Plan to increase amlodipine to 10 mg. Patient's daughter reports concern about his normal blood pressure in the past.  She is concerned about dropping his blood pressure too low.    -Advised patient's daughter to measure his blood pressure 2-3 times weekly. If his BP is consistently < 140/90, she will continue the same dose Amlodipine 5 mg. If his blood pressure is consistently higher than 140/90, she will start Amlodipine 10 mg. She will call us in a few weeks to notify us of the change.  -Continue lasix 40 mg PRN -BMP check today

## 2020-10-25 NOTE — Assessment & Plan Note (Addendum)
-  Repeat BMP: Serum creatinine and electrolytes unremarkable

## 2020-10-26 LAB — BMP8+ANION GAP
Anion Gap: 12 mmol/L (ref 10.0–18.0)
BUN/Creatinine Ratio: 16 (ref 10–24)
BUN: 18 mg/dL (ref 10–36)
CO2: 25 mmol/L (ref 20–29)
Calcium: 10.4 mg/dL — ABNORMAL HIGH (ref 8.6–10.2)
Chloride: 107 mmol/L — ABNORMAL HIGH (ref 96–106)
Creatinine, Ser: 1.15 mg/dL (ref 0.76–1.27)
Glucose: 91 mg/dL (ref 65–99)
Potassium: 4.4 mmol/L (ref 3.5–5.2)
Sodium: 144 mmol/L (ref 134–144)
eGFR: 59 mL/min/{1.73_m2} — ABNORMAL LOW (ref >59)

## 2020-10-27 NOTE — Progress Notes (Signed)
Internal Medicine Clinic Attending  Case discussed with Dr. Nguyen  At the time of the visit.  We reviewed the resident's history and exam and pertinent patient test results.  I agree with the assessment, diagnosis, and plan of care documented in the resident's note. 

## 2020-11-02 DIAGNOSIS — I5032 Chronic diastolic (congestive) heart failure: Secondary | ICD-10-CM | POA: Diagnosis not present

## 2020-11-02 DIAGNOSIS — Z9181 History of falling: Secondary | ICD-10-CM | POA: Diagnosis not present

## 2020-11-02 DIAGNOSIS — G4089 Other seizures: Secondary | ICD-10-CM | POA: Diagnosis not present

## 2020-11-02 DIAGNOSIS — M6281 Muscle weakness (generalized): Secondary | ICD-10-CM | POA: Diagnosis not present

## 2020-11-02 DIAGNOSIS — L89622 Pressure ulcer of left heel, stage 2: Secondary | ICD-10-CM | POA: Diagnosis not present

## 2020-11-02 DIAGNOSIS — U071 COVID-19: Secondary | ICD-10-CM | POA: Diagnosis not present

## 2020-11-02 DIAGNOSIS — F0150 Vascular dementia without behavioral disturbance: Secondary | ICD-10-CM | POA: Diagnosis not present

## 2020-12-03 DIAGNOSIS — G4089 Other seizures: Secondary | ICD-10-CM | POA: Diagnosis not present

## 2020-12-03 DIAGNOSIS — U071 COVID-19: Secondary | ICD-10-CM | POA: Diagnosis not present

## 2020-12-03 DIAGNOSIS — L89622 Pressure ulcer of left heel, stage 2: Secondary | ICD-10-CM | POA: Diagnosis not present

## 2020-12-03 DIAGNOSIS — M6281 Muscle weakness (generalized): Secondary | ICD-10-CM | POA: Diagnosis not present

## 2020-12-03 DIAGNOSIS — Z9181 History of falling: Secondary | ICD-10-CM | POA: Diagnosis not present

## 2020-12-03 DIAGNOSIS — F0150 Vascular dementia without behavioral disturbance: Secondary | ICD-10-CM | POA: Diagnosis not present

## 2020-12-03 DIAGNOSIS — I5032 Chronic diastolic (congestive) heart failure: Secondary | ICD-10-CM | POA: Diagnosis not present

## 2020-12-15 DIAGNOSIS — G40109 Localization-related (focal) (partial) symptomatic epilepsy and epileptic syndromes with simple partial seizures, not intractable, without status epilepticus: Secondary | ICD-10-CM | POA: Diagnosis not present

## 2020-12-15 DIAGNOSIS — N183 Chronic kidney disease, stage 3 unspecified: Secondary | ICD-10-CM | POA: Diagnosis not present

## 2020-12-15 DIAGNOSIS — D500 Iron deficiency anemia secondary to blood loss (chronic): Secondary | ICD-10-CM | POA: Diagnosis not present

## 2020-12-15 DIAGNOSIS — I5032 Chronic diastolic (congestive) heart failure: Secondary | ICD-10-CM | POA: Diagnosis not present

## 2020-12-15 DIAGNOSIS — K5731 Diverticulosis of large intestine without perforation or abscess with bleeding: Secondary | ICD-10-CM | POA: Diagnosis not present

## 2020-12-15 DIAGNOSIS — N400 Enlarged prostate without lower urinary tract symptoms: Secondary | ICD-10-CM | POA: Diagnosis not present

## 2020-12-15 DIAGNOSIS — E785 Hyperlipidemia, unspecified: Secondary | ICD-10-CM | POA: Diagnosis not present

## 2020-12-15 DIAGNOSIS — F0150 Vascular dementia without behavioral disturbance: Secondary | ICD-10-CM | POA: Diagnosis not present

## 2020-12-15 DIAGNOSIS — I130 Hypertensive heart and chronic kidney disease with heart failure and stage 1 through stage 4 chronic kidney disease, or unspecified chronic kidney disease: Secondary | ICD-10-CM | POA: Diagnosis not present

## 2020-12-16 ENCOUNTER — Telehealth: Payer: Self-pay

## 2020-12-16 MED ORDER — AMLODIPINE BESYLATE 5 MG PO TABS: 5.0000 mg | ORAL_TABLET | Freq: Every day | ORAL | 11 refills | Status: DC

## 2020-12-16 NOTE — Telephone Encounter (Signed)
I sent in 5mg  amlodpine Rx. IT is fine to hold off the iron till I see him next month

## 2020-12-16 NOTE — Telephone Encounter (Signed)
Pt's daughter requesting to speak with a nurse about meds. Please call back.

## 2020-12-16 NOTE — Telephone Encounter (Signed)
TC to patient's daughter, [name] [facility]), she states patient's b/p has been running fine since LOV with average b/p of 126/64.  States at [facility] with resident she was advised to increase dose if patient's b/p was running over 140/90's.  She states she did not have to increase his amlodipine to 10mg  and have kept the dose at 5mg  daily.  However, she just received the new RX from her mail off pharmacy for Amlodipine 10mg .  She does not feel comfortable cutting the pills in half and is requesting a new RX for the 5mg  dose be sent to the mail off pharmacy, [place].  Also states patient does not want to take his iron pills which were prescribed by the resident.  He wants to talk to PCP about it.  Pt has a f/u with PCP on 01/13/21.  Will sent RX refill request for the Amlodipine 5mg  to PCP. Thank you, SChaplin, RN,BSN

## 2020-12-21 DIAGNOSIS — I130 Hypertensive heart and chronic kidney disease with heart failure and stage 1 through stage 4 chronic kidney disease, or unspecified chronic kidney disease: Secondary | ICD-10-CM | POA: Diagnosis not present

## 2020-12-21 DIAGNOSIS — D500 Iron deficiency anemia secondary to blood loss (chronic): Secondary | ICD-10-CM | POA: Diagnosis not present

## 2020-12-21 DIAGNOSIS — F0150 Vascular dementia without behavioral disturbance: Secondary | ICD-10-CM | POA: Diagnosis not present

## 2020-12-21 DIAGNOSIS — N183 Chronic kidney disease, stage 3 unspecified: Secondary | ICD-10-CM | POA: Diagnosis not present

## 2020-12-21 DIAGNOSIS — E785 Hyperlipidemia, unspecified: Secondary | ICD-10-CM | POA: Diagnosis not present

## 2020-12-21 DIAGNOSIS — K5731 Diverticulosis of large intestine without perforation or abscess with bleeding: Secondary | ICD-10-CM | POA: Diagnosis not present

## 2020-12-21 DIAGNOSIS — N400 Enlarged prostate without lower urinary tract symptoms: Secondary | ICD-10-CM | POA: Diagnosis not present

## 2020-12-21 DIAGNOSIS — I5032 Chronic diastolic (congestive) heart failure: Secondary | ICD-10-CM | POA: Diagnosis not present

## 2020-12-21 DIAGNOSIS — G40109 Localization-related (focal) (partial) symptomatic epilepsy and epileptic syndromes with simple partial seizures, not intractable, without status epilepticus: Secondary | ICD-10-CM | POA: Diagnosis not present

## 2020-12-23 DIAGNOSIS — I130 Hypertensive heart and chronic kidney disease with heart failure and stage 1 through stage 4 chronic kidney disease, or unspecified chronic kidney disease: Secondary | ICD-10-CM | POA: Diagnosis not present

## 2020-12-23 DIAGNOSIS — E785 Hyperlipidemia, unspecified: Secondary | ICD-10-CM | POA: Diagnosis not present

## 2020-12-23 DIAGNOSIS — K5731 Diverticulosis of large intestine without perforation or abscess with bleeding: Secondary | ICD-10-CM | POA: Diagnosis not present

## 2020-12-23 DIAGNOSIS — D500 Iron deficiency anemia secondary to blood loss (chronic): Secondary | ICD-10-CM | POA: Diagnosis not present

## 2020-12-23 DIAGNOSIS — F0150 Vascular dementia without behavioral disturbance: Secondary | ICD-10-CM | POA: Diagnosis not present

## 2020-12-23 DIAGNOSIS — G40109 Localization-related (focal) (partial) symptomatic epilepsy and epileptic syndromes with simple partial seizures, not intractable, without status epilepticus: Secondary | ICD-10-CM | POA: Diagnosis not present

## 2020-12-23 DIAGNOSIS — I5032 Chronic diastolic (congestive) heart failure: Secondary | ICD-10-CM | POA: Diagnosis not present

## 2020-12-23 DIAGNOSIS — N183 Chronic kidney disease, stage 3 unspecified: Secondary | ICD-10-CM | POA: Diagnosis not present

## 2020-12-23 DIAGNOSIS — N400 Enlarged prostate without lower urinary tract symptoms: Secondary | ICD-10-CM | POA: Diagnosis not present

## 2020-12-28 DIAGNOSIS — I130 Hypertensive heart and chronic kidney disease with heart failure and stage 1 through stage 4 chronic kidney disease, or unspecified chronic kidney disease: Secondary | ICD-10-CM | POA: Diagnosis not present

## 2020-12-28 DIAGNOSIS — N400 Enlarged prostate without lower urinary tract symptoms: Secondary | ICD-10-CM | POA: Diagnosis not present

## 2020-12-28 DIAGNOSIS — G40109 Localization-related (focal) (partial) symptomatic epilepsy and epileptic syndromes with simple partial seizures, not intractable, without status epilepticus: Secondary | ICD-10-CM | POA: Diagnosis not present

## 2020-12-28 DIAGNOSIS — D500 Iron deficiency anemia secondary to blood loss (chronic): Secondary | ICD-10-CM | POA: Diagnosis not present

## 2020-12-28 DIAGNOSIS — F0150 Vascular dementia without behavioral disturbance: Secondary | ICD-10-CM | POA: Diagnosis not present

## 2020-12-28 DIAGNOSIS — K5731 Diverticulosis of large intestine without perforation or abscess with bleeding: Secondary | ICD-10-CM | POA: Diagnosis not present

## 2020-12-28 DIAGNOSIS — I5032 Chronic diastolic (congestive) heart failure: Secondary | ICD-10-CM | POA: Diagnosis not present

## 2020-12-28 DIAGNOSIS — E785 Hyperlipidemia, unspecified: Secondary | ICD-10-CM | POA: Diagnosis not present

## 2020-12-28 DIAGNOSIS — N183 Chronic kidney disease, stage 3 unspecified: Secondary | ICD-10-CM | POA: Diagnosis not present

## 2020-12-29 DIAGNOSIS — E785 Hyperlipidemia, unspecified: Secondary | ICD-10-CM | POA: Diagnosis not present

## 2020-12-29 DIAGNOSIS — N183 Chronic kidney disease, stage 3 unspecified: Secondary | ICD-10-CM | POA: Diagnosis not present

## 2020-12-29 DIAGNOSIS — I5032 Chronic diastolic (congestive) heart failure: Secondary | ICD-10-CM | POA: Diagnosis not present

## 2020-12-29 DIAGNOSIS — F0150 Vascular dementia without behavioral disturbance: Secondary | ICD-10-CM | POA: Diagnosis not present

## 2020-12-29 DIAGNOSIS — K5731 Diverticulosis of large intestine without perforation or abscess with bleeding: Secondary | ICD-10-CM | POA: Diagnosis not present

## 2020-12-29 DIAGNOSIS — N400 Enlarged prostate without lower urinary tract symptoms: Secondary | ICD-10-CM | POA: Diagnosis not present

## 2020-12-29 DIAGNOSIS — D500 Iron deficiency anemia secondary to blood loss (chronic): Secondary | ICD-10-CM | POA: Diagnosis not present

## 2020-12-29 DIAGNOSIS — I130 Hypertensive heart and chronic kidney disease with heart failure and stage 1 through stage 4 chronic kidney disease, or unspecified chronic kidney disease: Secondary | ICD-10-CM | POA: Diagnosis not present

## 2020-12-29 DIAGNOSIS — G40109 Localization-related (focal) (partial) symptomatic epilepsy and epileptic syndromes with simple partial seizures, not intractable, without status epilepticus: Secondary | ICD-10-CM | POA: Diagnosis not present

## 2021-01-02 DIAGNOSIS — L89622 Pressure ulcer of left heel, stage 2: Secondary | ICD-10-CM | POA: Diagnosis not present

## 2021-01-02 DIAGNOSIS — Z9181 History of falling: Secondary | ICD-10-CM | POA: Diagnosis not present

## 2021-01-02 DIAGNOSIS — U071 COVID-19: Secondary | ICD-10-CM | POA: Diagnosis not present

## 2021-01-02 DIAGNOSIS — F0150 Vascular dementia without behavioral disturbance: Secondary | ICD-10-CM | POA: Diagnosis not present

## 2021-01-02 DIAGNOSIS — G4089 Other seizures: Secondary | ICD-10-CM | POA: Diagnosis not present

## 2021-01-02 DIAGNOSIS — I5032 Chronic diastolic (congestive) heart failure: Secondary | ICD-10-CM | POA: Diagnosis not present

## 2021-01-02 DIAGNOSIS — M6281 Muscle weakness (generalized): Secondary | ICD-10-CM | POA: Diagnosis not present

## 2021-01-04 DIAGNOSIS — G40109 Localization-related (focal) (partial) symptomatic epilepsy and epileptic syndromes with simple partial seizures, not intractable, without status epilepticus: Secondary | ICD-10-CM | POA: Diagnosis not present

## 2021-01-04 DIAGNOSIS — I5032 Chronic diastolic (congestive) heart failure: Secondary | ICD-10-CM | POA: Diagnosis not present

## 2021-01-04 DIAGNOSIS — K5731 Diverticulosis of large intestine without perforation or abscess with bleeding: Secondary | ICD-10-CM | POA: Diagnosis not present

## 2021-01-04 DIAGNOSIS — E785 Hyperlipidemia, unspecified: Secondary | ICD-10-CM | POA: Diagnosis not present

## 2021-01-04 DIAGNOSIS — N400 Enlarged prostate without lower urinary tract symptoms: Secondary | ICD-10-CM | POA: Diagnosis not present

## 2021-01-04 DIAGNOSIS — D500 Iron deficiency anemia secondary to blood loss (chronic): Secondary | ICD-10-CM | POA: Diagnosis not present

## 2021-01-04 DIAGNOSIS — F0150 Vascular dementia without behavioral disturbance: Secondary | ICD-10-CM | POA: Diagnosis not present

## 2021-01-04 DIAGNOSIS — N183 Chronic kidney disease, stage 3 unspecified: Secondary | ICD-10-CM | POA: Diagnosis not present

## 2021-01-04 DIAGNOSIS — I130 Hypertensive heart and chronic kidney disease with heart failure and stage 1 through stage 4 chronic kidney disease, or unspecified chronic kidney disease: Secondary | ICD-10-CM | POA: Diagnosis not present

## 2021-01-06 DIAGNOSIS — F0150 Vascular dementia without behavioral disturbance: Secondary | ICD-10-CM | POA: Diagnosis not present

## 2021-01-06 DIAGNOSIS — N400 Enlarged prostate without lower urinary tract symptoms: Secondary | ICD-10-CM | POA: Diagnosis not present

## 2021-01-06 DIAGNOSIS — I5032 Chronic diastolic (congestive) heart failure: Secondary | ICD-10-CM | POA: Diagnosis not present

## 2021-01-06 DIAGNOSIS — N183 Chronic kidney disease, stage 3 unspecified: Secondary | ICD-10-CM | POA: Diagnosis not present

## 2021-01-06 DIAGNOSIS — I130 Hypertensive heart and chronic kidney disease with heart failure and stage 1 through stage 4 chronic kidney disease, or unspecified chronic kidney disease: Secondary | ICD-10-CM | POA: Diagnosis not present

## 2021-01-06 DIAGNOSIS — E785 Hyperlipidemia, unspecified: Secondary | ICD-10-CM | POA: Diagnosis not present

## 2021-01-06 DIAGNOSIS — D500 Iron deficiency anemia secondary to blood loss (chronic): Secondary | ICD-10-CM | POA: Diagnosis not present

## 2021-01-06 DIAGNOSIS — G40109 Localization-related (focal) (partial) symptomatic epilepsy and epileptic syndromes with simple partial seizures, not intractable, without status epilepticus: Secondary | ICD-10-CM | POA: Diagnosis not present

## 2021-01-06 DIAGNOSIS — K5731 Diverticulosis of large intestine without perforation or abscess with bleeding: Secondary | ICD-10-CM | POA: Diagnosis not present

## 2021-01-10 DIAGNOSIS — N183 Chronic kidney disease, stage 3 unspecified: Secondary | ICD-10-CM | POA: Diagnosis not present

## 2021-01-10 DIAGNOSIS — I5032 Chronic diastolic (congestive) heart failure: Secondary | ICD-10-CM | POA: Diagnosis not present

## 2021-01-10 DIAGNOSIS — I130 Hypertensive heart and chronic kidney disease with heart failure and stage 1 through stage 4 chronic kidney disease, or unspecified chronic kidney disease: Secondary | ICD-10-CM | POA: Diagnosis not present

## 2021-01-12 ENCOUNTER — Telehealth: Payer: Self-pay

## 2021-01-12 DIAGNOSIS — F0150 Vascular dementia without behavioral disturbance: Secondary | ICD-10-CM | POA: Diagnosis not present

## 2021-01-12 DIAGNOSIS — D500 Iron deficiency anemia secondary to blood loss (chronic): Secondary | ICD-10-CM | POA: Diagnosis not present

## 2021-01-12 DIAGNOSIS — I130 Hypertensive heart and chronic kidney disease with heart failure and stage 1 through stage 4 chronic kidney disease, or unspecified chronic kidney disease: Secondary | ICD-10-CM | POA: Diagnosis not present

## 2021-01-12 DIAGNOSIS — E785 Hyperlipidemia, unspecified: Secondary | ICD-10-CM | POA: Diagnosis not present

## 2021-01-12 DIAGNOSIS — N183 Chronic kidney disease, stage 3 unspecified: Secondary | ICD-10-CM | POA: Diagnosis not present

## 2021-01-12 DIAGNOSIS — K5731 Diverticulosis of large intestine without perforation or abscess with bleeding: Secondary | ICD-10-CM | POA: Diagnosis not present

## 2021-01-12 DIAGNOSIS — I5032 Chronic diastolic (congestive) heart failure: Secondary | ICD-10-CM | POA: Diagnosis not present

## 2021-01-12 DIAGNOSIS — N400 Enlarged prostate without lower urinary tract symptoms: Secondary | ICD-10-CM | POA: Diagnosis not present

## 2021-01-12 DIAGNOSIS — G40109 Localization-related (focal) (partial) symptomatic epilepsy and epileptic syndromes with simple partial seizures, not intractable, without status epilepticus: Secondary | ICD-10-CM | POA: Diagnosis not present

## 2021-01-12 NOTE — Telephone Encounter (Signed)
Received a TC from [place], [place] with [facility].  She is wanting to extend patient's PT for a little while to work on transitioning to standing: 1X/week for 4 weeks VO given  She also states that patient's daughter states patient has increased urination only at night.  Pt denies dysuria, no frequency, no foul odor or color.  Daughter reported sometimes it smells like ammonia or has no smell at all.  Pt has an appt tomorrow w/ PCP.  Informed Kindra PCP can discuss this with patient at [facility] visit. Thank you, SChaplin, RN,BSN

## 2021-01-13 ENCOUNTER — Ambulatory Visit (INDEPENDENT_AMBULATORY_CARE_PROVIDER_SITE_OTHER): Payer: Medicare HMO | Admitting: Internal Medicine

## 2021-01-13 ENCOUNTER — Other Ambulatory Visit: Payer: Self-pay

## 2021-01-13 VITALS — BP 143/84 | HR 69 | Temp 97.8°F | Ht 69.0 in | Wt 190.7 lb

## 2021-01-13 DIAGNOSIS — D508 Other iron deficiency anemias: Secondary | ICD-10-CM

## 2021-01-13 DIAGNOSIS — K5730 Diverticulosis of large intestine without perforation or abscess without bleeding: Secondary | ICD-10-CM

## 2021-01-13 DIAGNOSIS — R351 Nocturia: Secondary | ICD-10-CM | POA: Diagnosis not present

## 2021-01-13 DIAGNOSIS — Z23 Encounter for immunization: Secondary | ICD-10-CM | POA: Diagnosis not present

## 2021-01-13 DIAGNOSIS — I5032 Chronic diastolic (congestive) heart failure: Secondary | ICD-10-CM | POA: Diagnosis not present

## 2021-01-13 DIAGNOSIS — I10 Essential (primary) hypertension: Secondary | ICD-10-CM | POA: Diagnosis not present

## 2021-01-13 MED ORDER — AMLODIPINE BESYLATE 5 MG PO TABS: 5.0000 mg | ORAL_TABLET | Freq: Two times a day (BID) | ORAL | 11 refills | Status: DC

## 2021-01-13 NOTE — Patient Instructions (Signed)
COVID-19 Vaccine Information can be found at: [email or web address] For questions related to vaccine distribution or appointments, please email [email or web address] or call [phone].    

## 2021-01-14 ENCOUNTER — Encounter: Payer: Self-pay | Admitting: Internal Medicine

## 2021-01-14 DIAGNOSIS — R351 Nocturia: Secondary | ICD-10-CM | POA: Insufficient documentation

## 2021-01-14 NOTE — Assessment & Plan Note (Addendum)
No further bleeding, they have not been taking the iron supplement.  He had post bleed anemia, we will repeat CBC and iron studies today.

## 2021-01-14 NOTE — Assessment & Plan Note (Signed)
Previously well controlled with amlodipine 5 mg daily.  At his last hospitalization he was instructed to take 10 mg however she will continue to him at 5 mg.  Lately he has been running higher and she occasionally adjusted to 10 mg if needed.  I told her that this is reasonable his blood pressure is pretty much at goal today and that she has been doing a good job.  I discussed with her the option to split this into 5 mg twice daily and with hold the p.m. dose if his blood pressure is too low.

## 2021-01-14 NOTE — Progress Notes (Signed)
  Subjective:  HPI: [name] is a 87 y.o. male who presents for f/u anemia, HTN  He is accompanied by his daughter [name]  Please see Assessment and Plan below for the status of his chronic medical problems.  Objective:  Physical Exam: Vitals:   01/13/21 1107  BP: (!) 143/84  Pulse: 69  Temp: 97.8 F (36.6 C)  SpO2: 97%  Weight: 190 lb 11.2 oz (86.5 kg)  Height: 5\' 9"  (1.753 m)   Body mass index is 28.16 kg/m. Physical Exam Vitals and nursing note reviewed.  Constitutional:      Appearance: Normal appearance.     Comments: In transport wheelchair  Cardiovascular:     Rate and Rhythm: Normal rate and regular rhythm.  Pulmonary:     Effort: Pulmonary effort is normal.     Breath sounds: Normal breath sounds.  Musculoskeletal:     Right lower leg: Edema (1+) present.     Left lower leg: Edema (1+) present.  Neurological:     Mental Status: He is alert. Mental status is at baseline.   Assessment & Plan:  See Encounters Tab for problem based charting.  Medications Ordered Meds ordered this encounter  Medications   amLODipine (NORVASC) 5 MG tablet    Sig: Take 1 tablet (5 mg total) by mouth 2 (two) times daily.    Dispense:  60 tablet    Refill:  11   Other Orders Orders Placed This Encounter  Procedures   Flu Vaccine QUAD High Dose(Fluad)   CBC with Diff   Iron, TIBC and Ferritin Panel   POCT Urinalysis Dipstick ([id number])   Follow Up: Return in about 3 months (around 04/15/2021).

## 2021-01-14 NOTE — Assessment & Plan Note (Signed)
[name] let me know that he has had some increased markedly wet diapers overnight recently.  Does not seem to have changed his personality, complain of dysuria and urine does not appear to have any unusual odor.  She reports home health wanted to bring it to my attention.  I had plan to check a urinalysis however he is incontinent and could not urinate on command.  Offered I&O but we felt that there are not enough symptoms to warrant this at this time.  If symptoms persist we can have the home health team obtain a urinalysis.

## 2021-01-14 NOTE — Assessment & Plan Note (Signed)
She reports she has been doing overall well she has not had to use Lasix in a few months.  He does have some lower extremity edema but denies any shortness of breath.

## 2021-01-15 LAB — CBC WITH DIFFERENTIAL/PLATELET
Basophils Absolute: 0 10*3/uL (ref 0.0–0.2)
Basos: 0 %
EOS (ABSOLUTE): 0.2 10*3/uL (ref 0.0–0.4)
Eos: 4 %
Hematocrit: 37.5 % (ref 37.5–51.0)
Hemoglobin: 12.2 g/dL — ABNORMAL LOW (ref 13.0–17.7)
Immature Grans (Abs): 0 10*3/uL (ref 0.0–0.1)
Immature Granulocytes: 0 %
Lymphocytes Absolute: 1.3 10*3/uL (ref 0.7–3.1)
Lymphs: 22 %
MCH: 27.9 pg (ref 26.6–33.0)
MCHC: 32.5 g/dL (ref 31.5–35.7)
MCV: 86 fL (ref 79–97)
Monocytes Absolute: 0.4 10*3/uL (ref 0.1–0.9)
Monocytes: 7 %
Neutrophils Absolute: 3.9 10*3/uL (ref 1.4–7.0)
Neutrophils: 67 %
Platelets: 153 10*3/uL (ref 150–450)
RBC: 4.38 x10E6/uL (ref 4.14–5.80)
RDW: 13.8 % (ref 11.6–15.4)
WBC: 5.9 10*3/uL (ref 3.4–10.8)

## 2021-01-15 LAB — IRON,TIBC AND FERRITIN PANEL
Ferritin: 27 ng/mL — ABNORMAL LOW (ref 30–400)
Iron Saturation: 17 % (ref 15–55)
Iron: 62 ug/dL (ref 38–169)
Total Iron Binding Capacity: 362 ug/dL (ref 250–450)
UIBC: 300 ug/dL (ref 111–343)

## 2021-01-18 DIAGNOSIS — I130 Hypertensive heart and chronic kidney disease with heart failure and stage 1 through stage 4 chronic kidney disease, or unspecified chronic kidney disease: Secondary | ICD-10-CM | POA: Diagnosis not present

## 2021-01-18 DIAGNOSIS — N183 Chronic kidney disease, stage 3 unspecified: Secondary | ICD-10-CM | POA: Diagnosis not present

## 2021-01-18 DIAGNOSIS — E785 Hyperlipidemia, unspecified: Secondary | ICD-10-CM | POA: Diagnosis not present

## 2021-01-18 DIAGNOSIS — N400 Enlarged prostate without lower urinary tract symptoms: Secondary | ICD-10-CM | POA: Diagnosis not present

## 2021-01-18 DIAGNOSIS — G40109 Localization-related (focal) (partial) symptomatic epilepsy and epileptic syndromes with simple partial seizures, not intractable, without status epilepticus: Secondary | ICD-10-CM | POA: Diagnosis not present

## 2021-01-18 DIAGNOSIS — F0150 Vascular dementia without behavioral disturbance: Secondary | ICD-10-CM | POA: Diagnosis not present

## 2021-01-18 DIAGNOSIS — K5731 Diverticulosis of large intestine without perforation or abscess with bleeding: Secondary | ICD-10-CM | POA: Diagnosis not present

## 2021-01-18 DIAGNOSIS — D500 Iron deficiency anemia secondary to blood loss (chronic): Secondary | ICD-10-CM | POA: Diagnosis not present

## 2021-01-18 DIAGNOSIS — I5032 Chronic diastolic (congestive) heart failure: Secondary | ICD-10-CM | POA: Diagnosis not present

## 2021-01-27 DIAGNOSIS — D500 Iron deficiency anemia secondary to blood loss (chronic): Secondary | ICD-10-CM | POA: Diagnosis not present

## 2021-01-27 DIAGNOSIS — K5731 Diverticulosis of large intestine without perforation or abscess with bleeding: Secondary | ICD-10-CM | POA: Diagnosis not present

## 2021-01-27 DIAGNOSIS — E785 Hyperlipidemia, unspecified: Secondary | ICD-10-CM | POA: Diagnosis not present

## 2021-01-27 DIAGNOSIS — I5032 Chronic diastolic (congestive) heart failure: Secondary | ICD-10-CM | POA: Diagnosis not present

## 2021-01-27 DIAGNOSIS — I130 Hypertensive heart and chronic kidney disease with heart failure and stage 1 through stage 4 chronic kidney disease, or unspecified chronic kidney disease: Secondary | ICD-10-CM | POA: Diagnosis not present

## 2021-01-27 DIAGNOSIS — N183 Chronic kidney disease, stage 3 unspecified: Secondary | ICD-10-CM | POA: Diagnosis not present

## 2021-01-27 DIAGNOSIS — G40109 Localization-related (focal) (partial) symptomatic epilepsy and epileptic syndromes with simple partial seizures, not intractable, without status epilepticus: Secondary | ICD-10-CM | POA: Diagnosis not present

## 2021-01-27 DIAGNOSIS — N400 Enlarged prostate without lower urinary tract symptoms: Secondary | ICD-10-CM | POA: Diagnosis not present

## 2021-01-27 DIAGNOSIS — F0150 Vascular dementia without behavioral disturbance: Secondary | ICD-10-CM | POA: Diagnosis not present

## 2021-02-01 DIAGNOSIS — N400 Enlarged prostate without lower urinary tract symptoms: Secondary | ICD-10-CM | POA: Diagnosis not present

## 2021-02-01 DIAGNOSIS — I5032 Chronic diastolic (congestive) heart failure: Secondary | ICD-10-CM | POA: Diagnosis not present

## 2021-02-01 DIAGNOSIS — F0150 Vascular dementia without behavioral disturbance: Secondary | ICD-10-CM | POA: Diagnosis not present

## 2021-02-01 DIAGNOSIS — E785 Hyperlipidemia, unspecified: Secondary | ICD-10-CM | POA: Diagnosis not present

## 2021-02-01 DIAGNOSIS — K5731 Diverticulosis of large intestine without perforation or abscess with bleeding: Secondary | ICD-10-CM | POA: Diagnosis not present

## 2021-02-01 DIAGNOSIS — N183 Chronic kidney disease, stage 3 unspecified: Secondary | ICD-10-CM | POA: Diagnosis not present

## 2021-02-01 DIAGNOSIS — D500 Iron deficiency anemia secondary to blood loss (chronic): Secondary | ICD-10-CM | POA: Diagnosis not present

## 2021-02-01 DIAGNOSIS — G40109 Localization-related (focal) (partial) symptomatic epilepsy and epileptic syndromes with simple partial seizures, not intractable, without status epilepticus: Secondary | ICD-10-CM | POA: Diagnosis not present

## 2021-02-01 DIAGNOSIS — I130 Hypertensive heart and chronic kidney disease with heart failure and stage 1 through stage 4 chronic kidney disease, or unspecified chronic kidney disease: Secondary | ICD-10-CM | POA: Diagnosis not present

## 2021-02-09 DIAGNOSIS — I130 Hypertensive heart and chronic kidney disease with heart failure and stage 1 through stage 4 chronic kidney disease, or unspecified chronic kidney disease: Secondary | ICD-10-CM | POA: Diagnosis not present

## 2021-02-09 DIAGNOSIS — N183 Chronic kidney disease, stage 3 unspecified: Secondary | ICD-10-CM | POA: Diagnosis not present

## 2021-02-09 DIAGNOSIS — I5032 Chronic diastolic (congestive) heart failure: Secondary | ICD-10-CM | POA: Diagnosis not present

## 2021-02-10 ENCOUNTER — Ambulatory Visit (INDEPENDENT_AMBULATORY_CARE_PROVIDER_SITE_OTHER): Payer: Medicare HMO | Admitting: Internal Medicine

## 2021-02-10 ENCOUNTER — Telehealth: Payer: Self-pay | Admitting: *Deleted

## 2021-02-10 ENCOUNTER — Encounter: Payer: Self-pay | Admitting: Internal Medicine

## 2021-02-10 ENCOUNTER — Other Ambulatory Visit: Payer: Self-pay

## 2021-02-10 VITALS — BP 151/89 | HR 81 | Temp 98.1°F | Wt 190.4 lb

## 2021-02-10 DIAGNOSIS — N390 Urinary tract infection, site not specified: Secondary | ICD-10-CM | POA: Diagnosis not present

## 2021-02-10 DIAGNOSIS — I130 Hypertensive heart and chronic kidney disease with heart failure and stage 1 through stage 4 chronic kidney disease, or unspecified chronic kidney disease: Secondary | ICD-10-CM | POA: Diagnosis not present

## 2021-02-10 DIAGNOSIS — N400 Enlarged prostate without lower urinary tract symptoms: Secondary | ICD-10-CM | POA: Diagnosis not present

## 2021-02-10 DIAGNOSIS — R3989 Other symptoms and signs involving the genitourinary system: Secondary | ICD-10-CM

## 2021-02-10 DIAGNOSIS — E349 Endocrine disorder, unspecified: Secondary | ICD-10-CM

## 2021-02-10 DIAGNOSIS — E785 Hyperlipidemia, unspecified: Secondary | ICD-10-CM | POA: Diagnosis not present

## 2021-02-10 DIAGNOSIS — K5731 Diverticulosis of large intestine without perforation or abscess with bleeding: Secondary | ICD-10-CM | POA: Diagnosis not present

## 2021-02-10 DIAGNOSIS — N183 Chronic kidney disease, stage 3 unspecified: Secondary | ICD-10-CM | POA: Diagnosis not present

## 2021-02-10 DIAGNOSIS — G40109 Localization-related (focal) (partial) symptomatic epilepsy and epileptic syndromes with simple partial seizures, not intractable, without status epilepticus: Secondary | ICD-10-CM | POA: Diagnosis not present

## 2021-02-10 DIAGNOSIS — D500 Iron deficiency anemia secondary to blood loss (chronic): Secondary | ICD-10-CM | POA: Diagnosis not present

## 2021-02-10 DIAGNOSIS — I5032 Chronic diastolic (congestive) heart failure: Secondary | ICD-10-CM | POA: Diagnosis not present

## 2021-02-10 DIAGNOSIS — I10 Essential (primary) hypertension: Secondary | ICD-10-CM

## 2021-02-10 DIAGNOSIS — F0150 Vascular dementia without behavioral disturbance: Secondary | ICD-10-CM | POA: Diagnosis not present

## 2021-02-10 LAB — POCT URINALYSIS DIPSTICK
Bilirubin, UA: NEGATIVE
Glucose, UA: NEGATIVE
Ketones, UA: NEGATIVE
Nitrite, UA: NEGATIVE
Protein, UA: POSITIVE — AB
Spec Grav, UA: 1.02 (ref 1.010–1.025)
Urobilinogen, UA: 0.2 E.U./dL
pH, UA: 6 (ref 5.0–8.0)

## 2021-02-10 NOTE — Assessment & Plan Note (Addendum)
BP 151/89 today. 143/84 during previous visit ~1 mo ago. Per chart review, previously well controlled with amlodipine 5 qd but was instructed during a previous hospitalization to start taking amlodipine 10 qd. Daughter reports pt has been taking 10 mg qd. Reports good medication compliance without any adverse effects.   Continue amlodipine 10 mg

## 2021-02-10 NOTE — Assessment & Plan Note (Addendum)
Pt's daughter called clinic for acute visit as she noticed behavior changes in the pt. Reports that he normally leans to his right side on his wheelchair however was leaning to the left side today. She also reports that she witnessed him potentially almost fall earlier today 2/2 poor gait. Reports no recent trauma or falls. In the clinic today, she reports that he is essentially at his baseline. On exam, elderly, cooperative, interactive male in NAD. Comprehensive neuro exam benign with no acute changes. He is A&Ox3, 5/5 strength in bilateral upper and lower extremities, equal sensation bilaterally, tongue midline, smile symmetrical, and EOMI. Lungs CTAB and heart RRR. Overall benign exam; instructed pt to go to ED if she notes weakness, speech changes, confusion, or any further changes. Reports no chills, fever, N/V, diarrhea, constipation, dysuria, polyuria, abdominal pain, or leg swelling. Pt is afebrile. POC UA with small amount blood, and trace leukocytes; no nitrites, or ketones. Ordered UA, culture, BMP and CBC for further evaluation.  No abx for now.   F/u on UA and urine culutre  F/u on BMP and CBC   Addendum: Urine micro with many bacteria and leukocytes (11-30); no nitrites on U/A. CBC at baseline. BMP with Na 149, Cl 112 and calcium 11; suspect 2/2 dehydration. Called daughter with lab results and sent prescription for Bactrim DS bid x 7 days. Will f/u on urine cx. Daughter states pt possibly drinking less water than usual. Advised to increase fluid intake, and to f/u in clinic in 1 week for repeat BMP.   Addendum: Urine cx with pseudomonas. Pt was admitted to the hospital by the time urine cx resulted. Contacted physician taking care of pt to relay results.

## 2021-02-10 NOTE — Telephone Encounter (Signed)
Patient's daughter called in stating she has noted some changes in patient for 2-3 days. States he usually leans to the right but has now been leaning to the left. Also, patient usually has calm temperament but has been "hollering." Appt given today at 3:30 with [facility].

## 2021-02-10 NOTE — Patient Instructions (Addendum)
Thank you, Mr.Giang D Lopezperez for allowing [place] to provide your care today!  Today we discussed:  Your father's different appearing behavior: We did a urinalysis but it was not convincing of a UTI. We are going to send it to the lab for further evaluation. If positive, I'll send antibiotics and will call you.  We also got some lab work on him to see if there is an infection.  Please continue to note for changes, and take your father to the ED if you notice any confusion, new weakness, or speech changes.    I will call you if any labs, tests, or imaging results require any further attention or action.   I ordered the following labs:  Lab Orders         Culture, Urine         Urinalysis, Complete (81001)         BMP8+Anion Gap         CBC no Diff         POCT Urinalysis Dipstick (38882)      Medications ordered or changed:  None   Follow up in: 2 mo or earlier if needed   Should you have any questions or concerns please call the internal medicine clinic at [phone].     Lajean Manes, MD  Internal Medicine Resident, PGY-1 [facility]

## 2021-02-10 NOTE — Progress Notes (Signed)
CC: acute visit for "unusual behavior" per daughter.   He is accompanied by his daughter [name].   HPI:  [name] is a 87 y.o. male with a PMHx stated below and presents today for stated above. Please see the Encounters tab for problem-based Assessment & Plan for additional details.   Past Medical History:  Diagnosis Date   Anemia    BPH (benign prostatic hypertrophy)    TURP 05/19/13   Cerebral embolism with cerebral infarction ([place]) 12/19/2013   Cerebrovascular accident (CVA) ([place]) 04/17/2019   Chronic kidney disease    CHRONIC KIDNEY DISEASE, 2   COVID-19 03/19/2020   1/7- 03/26/2020 presentation of generalized weakness.  Treated with Decadron. CRP peaked at 2.3 and was 0.6 at discharge.  No D-dimer performed.   Difficulty hearing, right    BILATERAL HEARING LOSS - BEST TO TRY TO SPEAK INTO LEFT EAR   DVT (deep venous thrombosis) (HCC)    Frequent falls    History of DVT (deep vein thrombosis) 06/10/2013   Provoked s/p TURP. Dx per doppler 06/10/13. Complicated by hematuria while on anticoagulation s/p TURP. Initially on lovenox and coumadin, then stopped, then IVC filter placed 06/16/13. Anticoagulation restarted after hematuria stopped. Lovenox was discontinued apparently on 07/29/13 with comment on high risk for falls.   Total ~1.5 months of anticoagulation but interrupted with bleeding complication.   Hyperlipidemia    Hypertension    Incontinence of urine    SOME INCONTINENCE   Left adrenal mass ([place]) 09/11/2013   Meningioma ([place])    Stroke ([place])    Cerebellar, 2013; WALKS WITH WALKER, ABLE TO DRESS AND BATHE HIMSELF BUT FAMILY TRIES TO PROVIDE SUPERVISION BECAUSE OF HIS HX OF FALL AND WEAKNESS LEGS, ARMS    Thrombocytopenia ([place])    Vitreous hemorrhage ([place]) 12/21/2013   OS 12/18/13 CT /MRI brain done for ataxia     Current Outpatient Medications on File Prior to Visit  Medication Sig Dispense Refill   amLODipine (NORVASC) 5 MG tablet Take 1 tablet (5 mg total) by  mouth 2 (two) times daily. 60 tablet 11   aspirin 81 MG EC tablet Take 1 tablet (81 mg total) by mouth daily. 30 tablet 0   atorvastatin (LIPITOR) 20 MG tablet Take 1 tablet (20 mg total) by mouth daily. 30 tablet 0   furosemide (LASIX) 40 MG tablet Take 1 tablet (40 mg total) by mouth daily as needed for fluid. 30 tablet 0   iron polysaccharides (NIFEREX) 150 MG capsule Take 1 capsule (150 mg total) by mouth every other day. 15 capsule 2   triamcinolone ointment (KENALOG) 0.1 % Apply 1 application topically 2 (two) times daily as needed (For leg rash). 80 g 0   No current facility-administered medications on file prior to visit.    Family History  Problem Relation Age of Onset   Diabetes Mother    Heart disease Mother    Hypertension Mother    Heart disease Father    Hypertension Father    Hypertension Daughter    Thyroid disease Daughter     Social History   Socioeconomic History   Marital status: Widowed    Spouse name: Not on file   Number of children: 1   Years of education: 10th grade   Highest education level: Not on file  Occupational History   Occupation: Retired  Tobacco Use   Smoking status: Never   Smokeless tobacco: Never  Vaping Use   Vaping Use: Never used  Substance and Sexual Activity   Alcohol use: No    Alcohol/week: 0.0 standard drinks   Drug use: No   Sexual activity: Never  Other Topics Concern   Not on file  Social History Narrative   Current Social History 08/04/2019        Patient lives with his daughter, [name] in a home which is 1 story. There are steps up to the entrance with handrails and a ramp which the patient uses.       Patient's method of transportation is via his daughter.      The highest level of education was junior high (8th grade).      The patient is currently retired.      Identified important Relationships as his daughter, [name].       Pets : No       Interests / Fun: He enjoys sitting outside on the porch.        Current Stressors: Can't do what he used to be able to do, per his daughter       Religious / Personal Beliefs: Catholic       Lena, RN,BSN             Social Determinants of Health   Financial Resource Strain: Not on file  Food Insecurity: Not on file  Transportation Needs: Not on file  Physical Activity: Not on file  Stress: Not on file  Social Connections: Not on file  Intimate Partner Violence: Not on file    Review of Systems: ROS negative except for what is noted on the assessment and plan.  Vitals:   02/10/21 1450  BP: (!) 151/89  Pulse: 81  Temp: 98.1 F (36.7 C)  TempSrc: Oral  SpO2: 96%  Weight: 190 lb 6.4 oz (86.4 kg)     Physical Exam: Constitutional: alert, well-appearing, in NAD HENT: normocephalic, atraumatic, mucous membranes moist Eyes: conjunctiva non-erythematous, EOMI.  Cardiovascular: RRR, no m/r/g, non-edematous bilateral LE Pulmonary/Chest: normal work of breathing on RA, LCTAB Abdominal: soft, non-tender to palpation, non-distended MSK: normal bulk and tone Neurological: A&O x 3, 5/5 strength in bilateral upper and lower extremities. Equal sensation bilaterally. Tongue midline, smile symmetrical.  Skin: warm and dry  Psych: normal behavior, normal affect    Assessment & Plan:   See Encounters Tab for problem based charting.  Patient seen with Dr. Lavonda Jumbo, MD  Internal Medicine Resident, PGY-1 [facility]

## 2021-02-11 LAB — MICROSCOPIC EXAMINATION
Casts: NONE SEEN /lpf
Epithelial Cells (non renal): NONE SEEN /hpf (ref 0–10)

## 2021-02-11 LAB — BMP8+ANION GAP
Anion Gap: 11 mmol/L (ref 10.0–18.0)
BUN/Creatinine Ratio: 20 (ref 10–24)
BUN: 23 mg/dL (ref 10–36)
CO2: 26 mmol/L (ref 20–29)
Calcium: 11 mg/dL — ABNORMAL HIGH (ref 8.6–10.2)
Chloride: 112 mmol/L — ABNORMAL HIGH (ref 96–106)
Creatinine, Ser: 1.14 mg/dL (ref 0.76–1.27)
Glucose: 99 mg/dL (ref 70–99)
Potassium: 4.9 mmol/L (ref 3.5–5.2)
Sodium: 149 mmol/L — ABNORMAL HIGH (ref 134–144)
eGFR: 60 mL/min/{1.73_m2} (ref >59)

## 2021-02-11 LAB — URINALYSIS, COMPLETE
Bilirubin, UA: NEGATIVE
Glucose, UA: NEGATIVE
Ketones, UA: NEGATIVE
Leukocytes,UA: NEGATIVE
Nitrite, UA: NEGATIVE
Specific Gravity, UA: 1.016 (ref 1.005–1.030)
Urobilinogen, Ur: 0.2 mg/dL (ref 0.2–1.0)
pH, UA: 6 (ref 5.0–7.5)

## 2021-02-11 LAB — CBC
Hematocrit: 35.6 % — ABNORMAL LOW (ref 37.5–51.0)
Hemoglobin: 11.4 g/dL — ABNORMAL LOW (ref 13.0–17.7)
MCH: 27.9 pg (ref 26.6–33.0)
MCHC: 32 g/dL (ref 31.5–35.7)
MCV: 87 fL (ref 79–97)
Platelets: 190 10*3/uL (ref 150–450)
RBC: 4.09 x10E6/uL — ABNORMAL LOW (ref 4.14–5.80)
RDW: 13.6 % (ref 11.6–15.4)
WBC: 10 10*3/uL (ref 3.4–10.8)

## 2021-02-11 MED ORDER — SULFAMETHOXAZOLE-TRIMETHOPRIM 800-160 MG PO TABS: 1.0000 | ORAL_TABLET | Freq: Two times a day (BID) | ORAL | 0 refills | Status: DC

## 2021-02-11 NOTE — Addendum Note (Signed)
Addended byLajean Manes on: 02/11/2021 09:53 AM   Modules accepted: Orders

## 2021-02-11 NOTE — Progress Notes (Signed)
Internal Medicine Clinic Attending  I saw and evaluated the patient.  I personally confirmed the key portions of the history and exam documented by Dr. Patel and I reviewed pertinent patient test results.  The assessment, diagnosis, and plan were formulated together and I agree with the documentation in the resident's note.  

## 2021-02-12 ENCOUNTER — Encounter (HOSPITAL_COMMUNITY): Payer: Self-pay | Admitting: Emergency Medicine

## 2021-02-12 ENCOUNTER — Inpatient Hospital Stay (HOSPITAL_COMMUNITY)
Admission: EM | Admit: 2021-02-12 | Discharge: 2021-02-16 | DRG: 194 | Disposition: A | Discharge status: Skilled Nursing Facility | Payer: Medicare HMO | Attending: Student in an Organized Health Care Education/Training Program | Admitting: Student in an Organized Health Care Education/Training Program

## 2021-02-12 ENCOUNTER — Emergency Department (HOSPITAL_COMMUNITY): Payer: Medicare HMO

## 2021-02-12 ENCOUNTER — Observation Stay (HOSPITAL_COMMUNITY): Payer: Medicare HMO

## 2021-02-12 ENCOUNTER — Other Ambulatory Visit: Payer: Self-pay

## 2021-02-12 DIAGNOSIS — Z66 Do not resuscitate: Secondary | ICD-10-CM | POA: Diagnosis present

## 2021-02-12 DIAGNOSIS — R531 Weakness: Secondary | ICD-10-CM

## 2021-02-12 DIAGNOSIS — N179 Acute kidney failure, unspecified: Secondary | ICD-10-CM | POA: Diagnosis not present

## 2021-02-12 DIAGNOSIS — R296 Repeated falls: Secondary | ICD-10-CM | POA: Diagnosis present

## 2021-02-12 DIAGNOSIS — R41841 Cognitive communication deficit: Secondary | ICD-10-CM | POA: Diagnosis not present

## 2021-02-12 DIAGNOSIS — Z20822 Contact with and (suspected) exposure to covid-19: Secondary | ICD-10-CM | POA: Diagnosis not present

## 2021-02-12 DIAGNOSIS — R509 Fever, unspecified: Secondary | ICD-10-CM | POA: Diagnosis not present

## 2021-02-12 DIAGNOSIS — Z86718 Personal history of other venous thrombosis and embolism: Secondary | ICD-10-CM

## 2021-02-12 DIAGNOSIS — M6258 Muscle wasting and atrophy, not elsewhere classified, other site: Secondary | ICD-10-CM | POA: Diagnosis not present

## 2021-02-12 DIAGNOSIS — I639 Cerebral infarction, unspecified: Secondary | ICD-10-CM | POA: Diagnosis not present

## 2021-02-12 DIAGNOSIS — R0902 Hypoxemia: Secondary | ICD-10-CM | POA: Diagnosis present

## 2021-02-12 DIAGNOSIS — R404 Transient alteration of awareness: Secondary | ICD-10-CM | POA: Diagnosis not present

## 2021-02-12 DIAGNOSIS — R21 Rash and other nonspecific skin eruption: Secondary | ICD-10-CM | POA: Diagnosis present

## 2021-02-12 DIAGNOSIS — E785 Hyperlipidemia, unspecified: Secondary | ICD-10-CM | POA: Diagnosis present

## 2021-02-12 DIAGNOSIS — Z833 Family history of diabetes mellitus: Secondary | ICD-10-CM | POA: Diagnosis not present

## 2021-02-12 DIAGNOSIS — Z79899 Other long term (current) drug therapy: Secondary | ICD-10-CM

## 2021-02-12 DIAGNOSIS — G9341 Metabolic encephalopathy: Secondary | ICD-10-CM | POA: Diagnosis not present

## 2021-02-12 DIAGNOSIS — I10 Essential (primary) hypertension: Secondary | ICD-10-CM | POA: Diagnosis not present

## 2021-02-12 DIAGNOSIS — I69351 Hemiplegia and hemiparesis following cerebral infarction affecting right dominant side: Secondary | ICD-10-CM | POA: Diagnosis not present

## 2021-02-12 DIAGNOSIS — R918 Other nonspecific abnormal finding of lung field: Secondary | ICD-10-CM | POA: Diagnosis not present

## 2021-02-12 DIAGNOSIS — N183 Chronic kidney disease, stage 3 unspecified: Secondary | ICD-10-CM | POA: Diagnosis present

## 2021-02-12 DIAGNOSIS — Z86011 Personal history of benign neoplasm of the brain: Secondary | ICD-10-CM

## 2021-02-12 DIAGNOSIS — R410 Disorientation, unspecified: Secondary | ICD-10-CM | POA: Diagnosis not present

## 2021-02-12 DIAGNOSIS — E669 Obesity, unspecified: Secondary | ICD-10-CM | POA: Diagnosis present

## 2021-02-12 DIAGNOSIS — Z9079 Acquired absence of other genital organ(s): Secondary | ICD-10-CM | POA: Diagnosis not present

## 2021-02-12 DIAGNOSIS — F918 Other conduct disorders: Secondary | ICD-10-CM | POA: Diagnosis not present

## 2021-02-12 DIAGNOSIS — Z7401 Bed confinement status: Secondary | ICD-10-CM | POA: Diagnosis not present

## 2021-02-12 DIAGNOSIS — Z8673 Personal history of transient ischemic attack (TIA), and cerebral infarction without residual deficits: Secondary | ICD-10-CM | POA: Diagnosis not present

## 2021-02-12 DIAGNOSIS — N390 Urinary tract infection, site not specified: Secondary | ICD-10-CM | POA: Diagnosis not present

## 2021-02-12 DIAGNOSIS — R6510 Systemic inflammatory response syndrome (SIRS) of non-infectious origin without acute organ dysfunction: Secondary | ICD-10-CM

## 2021-02-12 DIAGNOSIS — A419 Sepsis, unspecified organism: Secondary | ICD-10-CM | POA: Diagnosis not present

## 2021-02-12 DIAGNOSIS — N400 Enlarged prostate without lower urinary tract symptoms: Secondary | ICD-10-CM | POA: Diagnosis present

## 2021-02-12 DIAGNOSIS — R1312 Dysphagia, oropharyngeal phase: Secondary | ICD-10-CM | POA: Diagnosis not present

## 2021-02-12 DIAGNOSIS — Z8616 Personal history of COVID-19: Secondary | ICD-10-CM

## 2021-02-12 DIAGNOSIS — Z6828 Body mass index (BMI) 28.0-28.9, adult: Secondary | ICD-10-CM

## 2021-02-12 DIAGNOSIS — L89626 Pressure-induced deep tissue damage of left heel: Secondary | ICD-10-CM | POA: Diagnosis present

## 2021-02-12 DIAGNOSIS — I129 Hypertensive chronic kidney disease with stage 1 through stage 4 chronic kidney disease, or unspecified chronic kidney disease: Secondary | ICD-10-CM | POA: Diagnosis present

## 2021-02-12 DIAGNOSIS — F0150 Vascular dementia without behavioral disturbance: Secondary | ICD-10-CM | POA: Diagnosis present

## 2021-02-12 DIAGNOSIS — Z8249 Family history of ischemic heart disease and other diseases of the circulatory system: Secondary | ICD-10-CM | POA: Diagnosis not present

## 2021-02-12 DIAGNOSIS — J189 Pneumonia, unspecified organism: Principal | ICD-10-CM | POA: Diagnosis present

## 2021-02-12 DIAGNOSIS — R262 Difficulty in walking, not elsewhere classified: Secondary | ICD-10-CM | POA: Diagnosis not present

## 2021-02-12 DIAGNOSIS — N1831 Chronic kidney disease, stage 3a: Secondary | ICD-10-CM | POA: Diagnosis present

## 2021-02-12 DIAGNOSIS — R2681 Unsteadiness on feet: Secondary | ICD-10-CM | POA: Diagnosis not present

## 2021-02-12 DIAGNOSIS — G9349 Other encephalopathy: Secondary | ICD-10-CM | POA: Diagnosis present

## 2021-02-12 DIAGNOSIS — B965 Pseudomonas (aeruginosa) (mallei) (pseudomallei) as the cause of diseases classified elsewhere: Secondary | ICD-10-CM | POA: Diagnosis present

## 2021-02-12 DIAGNOSIS — R000 Tachycardia, unspecified: Secondary | ICD-10-CM | POA: Diagnosis not present

## 2021-02-12 DIAGNOSIS — G9389 Other specified disorders of brain: Secondary | ICD-10-CM | POA: Diagnosis not present

## 2021-02-12 DIAGNOSIS — G319 Degenerative disease of nervous system, unspecified: Secondary | ICD-10-CM | POA: Diagnosis not present

## 2021-02-12 DIAGNOSIS — M6281 Muscle weakness (generalized): Secondary | ICD-10-CM | POA: Diagnosis not present

## 2021-02-12 DIAGNOSIS — Z7982 Long term (current) use of aspirin: Secondary | ICD-10-CM

## 2021-02-12 DIAGNOSIS — R2689 Other abnormalities of gait and mobility: Secondary | ICD-10-CM | POA: Diagnosis not present

## 2021-02-12 LAB — CBC WITH DIFFERENTIAL/PLATELET
Abs Immature Granulocytes: 0.08 10*3/uL — ABNORMAL HIGH (ref 0.00–0.07)
Basophils Absolute: 0 10*3/uL (ref 0.0–0.1)
Basophils Relative: 0 %
Eosinophils Absolute: 0.2 10*3/uL (ref 0.0–0.5)
Eosinophils Relative: 1 %
HCT: 36.2 % — ABNORMAL LOW (ref 39.0–52.0)
Hemoglobin: 11.1 g/dL — ABNORMAL LOW (ref 13.0–17.0)
Immature Granulocytes: 1 %
Lymphocytes Relative: 3 %
Lymphs Abs: 0.4 10*3/uL — ABNORMAL LOW (ref 0.7–4.0)
MCH: 28.7 pg (ref 26.0–34.0)
MCHC: 30.7 g/dL (ref 30.0–36.0)
MCV: 93.5 fL (ref 80.0–100.0)
Monocytes Absolute: 0.5 10*3/uL (ref 0.1–1.0)
Monocytes Relative: 4 %
Neutro Abs: 11.6 10*3/uL — ABNORMAL HIGH (ref 1.7–7.7)
Neutrophils Relative %: 91 %
Platelets: 176 10*3/uL (ref 150–400)
RBC: 3.87 MIL/uL — ABNORMAL LOW (ref 4.22–5.81)
RDW: 15.3 % (ref 11.5–15.5)
WBC: 12.7 10*3/uL — ABNORMAL HIGH (ref 4.0–10.5)
nRBC: 0 % (ref 0.0–0.2)

## 2021-02-12 LAB — URINALYSIS, ROUTINE W REFLEX MICROSCOPIC
Bilirubin Urine: NEGATIVE
Glucose, UA: NEGATIVE mg/dL
Ketones, ur: NEGATIVE mg/dL
Nitrite: NEGATIVE
Protein, ur: 30 mg/dL — AB
Specific Gravity, Urine: 1.013 (ref 1.005–1.030)
pH: 6 (ref 5.0–8.0)

## 2021-02-12 LAB — I-STAT CHEM 8, ED
BUN: 20 mg/dL (ref 8–23)
Calcium, Ion: 1.29 mmol/L (ref 1.15–1.40)
Chloride: 111 mmol/L (ref 98–111)
Creatinine, Ser: 1.4 mg/dL — ABNORMAL HIGH (ref 0.61–1.24)
Glucose, Bld: 100 mg/dL — ABNORMAL HIGH (ref 70–99)
HCT: 34 % — ABNORMAL LOW (ref 39.0–52.0)
Hemoglobin: 11.6 g/dL — ABNORMAL LOW (ref 13.0–17.0)
Potassium: 4 mmol/L (ref 3.5–5.1)
Sodium: 145 mmol/L (ref 135–145)
TCO2: 25 mmol/L (ref 22–32)

## 2021-02-12 LAB — RESP PANEL BY RT-PCR (FLU A&B, COVID) ARPGX2
Influenza A by PCR: NEGATIVE
Influenza B by PCR: NEGATIVE
SARS Coronavirus 2 by RT PCR: NEGATIVE

## 2021-02-12 LAB — COMPREHENSIVE METABOLIC PANEL
ALT: 13 U/L (ref 0–44)
AST: 15 U/L (ref 15–41)
Albumin: 2.7 g/dL — ABNORMAL LOW (ref 3.5–5.0)
Alkaline Phosphatase: 45 U/L (ref 38–126)
Anion gap: 6 (ref 5–15)
BUN: 20 mg/dL (ref 8–23)
CO2: 26 mmol/L (ref 22–32)
Calcium: 9.6 mg/dL (ref 8.9–10.3)
Chloride: 111 mmol/L (ref 98–111)
Creatinine, Ser: 1.38 mg/dL — ABNORMAL HIGH (ref 0.61–1.24)
GFR, Estimated: 47 mL/min — ABNORMAL LOW (ref >60)
Glucose, Bld: 104 mg/dL — ABNORMAL HIGH (ref 70–99)
Potassium: 4 mmol/L (ref 3.5–5.1)
Sodium: 143 mmol/L (ref 135–145)
Total Bilirubin: 0.4 mg/dL (ref 0.3–1.2)
Total Protein: 6.3 g/dL — ABNORMAL LOW (ref 6.5–8.1)

## 2021-02-12 LAB — PROTIME-INR
INR: 1.1 (ref 0.8–1.2)
Prothrombin Time: 14.6 seconds (ref 11.4–15.2)

## 2021-02-12 LAB — MRSA NEXT GEN BY PCR, NASAL: MRSA by PCR Next Gen: DETECTED — AB

## 2021-02-12 LAB — LACTIC ACID, PLASMA: Lactic Acid, Venous: 1.1 mmol/L (ref 0.5–1.9)

## 2021-02-12 LAB — APTT: aPTT: 30 seconds (ref 24–36)

## 2021-02-12 MED ORDER — ACETAMINOPHEN 325 MG PO TABS: 650.0000 mg | ORAL_TABLET | Freq: Four times a day (QID) | ORAL | Status: DC | PRN

## 2021-02-12 MED ORDER — HEPARIN SODIUM (PORCINE) 5000 UNIT/ML IJ SOLN
5000.0000 [IU] | Freq: Three times a day (TID) | INTRAMUSCULAR | Status: DC
  Administered 2021-02-12 – 2021-02-16 (×11): 5000 [IU] via SUBCUTANEOUS
  Filled 2021-02-12 (×11): qty 1

## 2021-02-12 MED ORDER — SODIUM CHLORIDE 0.9 % IV SOLN
500.0000 mg | INTRAVENOUS | Status: AC
  Administered 2021-02-12 – 2021-02-14 (×3): 500 mg via INTRAVENOUS
  Filled 2021-02-12 (×3): qty 500

## 2021-02-12 MED ORDER — ASPIRIN EC 81 MG PO TBEC
81.0000 mg | DELAYED_RELEASE_TABLET | Freq: Every day | ORAL | Status: DC
  Administered 2021-02-13 – 2021-02-16 (×4): 81 mg via ORAL
  Filled 2021-02-12 (×4): qty 1

## 2021-02-12 MED ORDER — VANCOMYCIN HCL 1250 MG/250ML IV SOLN: 1250.0000 mg | INTRAVENOUS | Status: DC

## 2021-02-12 MED ORDER — AMLODIPINE BESYLATE 5 MG PO TABS: 5.0000 mg | ORAL_TABLET | Freq: Two times a day (BID) | ORAL | Status: DC

## 2021-02-12 MED ORDER — ACETAMINOPHEN 500 MG PO TABS
1000.0000 mg | ORAL_TABLET | Freq: Once | ORAL | Status: AC
  Administered 2021-02-12: 1000 mg via ORAL
  Filled 2021-02-12: qty 2

## 2021-02-12 MED ORDER — SODIUM CHLORIDE 0.9 % IV SOLN
2.0000 g | Freq: Once | INTRAVENOUS | Status: AC
  Administered 2021-02-12: 2 g via INTRAVENOUS
  Filled 2021-02-12: qty 2

## 2021-02-12 MED ORDER — SODIUM CHLORIDE 0.9 % IV SOLN
1.0000 g | INTRAVENOUS | Status: AC
  Administered 2021-02-13 – 2021-02-14 (×3): 1 g via INTRAVENOUS
  Filled 2021-02-12 (×3): qty 10

## 2021-02-12 MED ORDER — ACETAMINOPHEN 650 MG RE SUPP: 650.0000 mg | Freq: Four times a day (QID) | RECTAL | Status: DC | PRN

## 2021-02-12 MED ORDER — ATORVASTATIN CALCIUM 10 MG PO TABS
20.0000 mg | ORAL_TABLET | Freq: Every day | ORAL | Status: DC
  Administered 2021-02-13 – 2021-02-16 (×4): 20 mg via ORAL
  Filled 2021-02-12 (×4): qty 2

## 2021-02-12 MED ORDER — VANCOMYCIN HCL 1750 MG/350ML IV SOLN
1750.0000 mg | Freq: Once | INTRAVENOUS | Status: AC
  Administered 2021-02-12: 1750 mg via INTRAVENOUS
  Filled 2021-02-12: qty 350

## 2021-02-12 NOTE — ED Notes (Signed)
Daughter [name] [phone] would like an update and ask the nurse a question

## 2021-02-12 NOTE — Progress Notes (Signed)
Pharmacy Antibiotic Note  [name] is a 87 y.o. male admitted on 02/12/2021 with pneumonia.  Pharmacy has been consulted for vancomycin dosing.  Patient presenting with weakness.  SCr 1.14 - near baseline WBC 12.7; LA 1.1; T 100.2 F  Plan: Cefepime per MD Vancomycin 1750 mg once then 1250 mg q24hr (eAUC 538) unless change in renal function Trend WBC, Fever, Renal function, & Clinical course F/u cultures, clinical course, WBC, fever De-escalate when able Levels at steady state     Temp (24hrs), Avg:100.2 F (37.9 C), Min:100.2 F (37.9 C), Max:100.2 F (37.9 C)  Recent Labs  Lab 02/10/21 1630 02/12/21 1156 02/12/21 1157  WBC 10.0 12.7*  --   CREATININE 1.14  --   --   LATICACIDVEN  --   --  1.1    Estimated Creatinine Clearance: 43.2 mL/min (by C-G formula based on SCr of 1.14 mg/dL).    No Known Allergies  Antimicrobials this admission: Cefepime x 1 in ED 12/3 vancomycin 12/3 >>   Microbiology results: Pending  Thank you for allowing pharmacy to be a part of this patient's care.  Lorelei Pont, PharmD, BCPS 02/12/2021 1:37 PM ED Clinical Pharmacist -  [phone]

## 2021-02-12 NOTE — ED Provider Notes (Signed)
[name] Provider Note   CSN: [id number] Arrival date & time: 02/12/21  1130     History Chief Complaint  Patient presents with   Weakness    [name] is a 87 y.o. male.  HPI Patient presents from home with family concern of weakness.  Patient denies complaints, but is slow of speech, cannot provide any in-depth details of his recent history.  Level 5 caveat secondary to cognitive disease. History is obtained by chart review, EMS and nursing staff.  Patient was reportedly started on antibiotic for an infection yesterday.  In spite of that the patient has had fever at home, been worsening in terms of generalized weakness and interactivity.    Past Medical History:  Diagnosis Date   Anemia    BPH (benign prostatic hypertrophy)    TURP 05/19/13   Cerebral embolism with cerebral infarction ([place]) 12/19/2013   Cerebrovascular accident (CVA) ([place]) 04/17/2019   Chronic kidney disease    CHRONIC KIDNEY DISEASE, 2   COVID-19 03/19/2020   1/7- 03/26/2020 presentation of generalized weakness.  Treated with Decadron. CRP peaked at 2.3 and was 0.6 at discharge.  No D-dimer performed.   Difficulty hearing, right    BILATERAL HEARING LOSS - BEST TO TRY TO SPEAK INTO LEFT EAR   DVT (deep venous thrombosis) (HCC)    Frequent falls    History of DVT (deep vein thrombosis) 06/10/2013   Provoked s/p TURP. Dx per doppler 06/10/13. Complicated by hematuria while on anticoagulation s/p TURP. Initially on lovenox and coumadin, then stopped, then IVC filter placed 06/16/13. Anticoagulation restarted after hematuria stopped. Lovenox was discontinued apparently on 07/29/13 with comment on high risk for falls.   Total ~1.5 months of anticoagulation but interrupted with bleeding complication.   Hyperlipidemia    Hypertension    Incontinence of urine    SOME INCONTINENCE   Left adrenal mass ([place]) 09/11/2013   Meningioma ([place])    Stroke ([place])    Cerebellar, 2013; WALKS  WITH WALKER, ABLE TO DRESS AND BATHE HIMSELF BUT FAMILY TRIES TO PROVIDE SUPERVISION BECAUSE OF HIS HX OF FALL AND WEAKNESS LEGS, ARMS    Thrombocytopenia ([place])    Vitreous hemorrhage ([place]) 12/21/2013   OS 12/18/13 CT /MRI brain done for ataxia     Patient Active Problem List   Diagnosis Date Noted   UTI (urinary tract infection) 02/10/2021   Nocturia 01/14/2021   Diverticulosis of colon 07/19/2020   Thrombocytopenia ([place]) 03/30/2020   Partial symptomatic epilepsy with complex partial seizures, not intractable, without status epilepticus ([place]) 04/17/2019   Chronic diastolic (congestive) heart failure ([place]) 10/28/2017   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   Vascular dementia ([place]) 01/28/2016   Presence of IVC filter 07/17/2014   CKD (chronic kidney disease) stage 3, GFR 30-59 ml/min (HCC) 06/23/2013   Hyperlipidemia    BPH (benign prostatic hyperplasia) 04/24/2013   Hearing loss 07/20/2011   Meningioma ([place]) 07/20/2011   HTN (hypertension) 07/19/2011    Past Surgical History:  Procedure Laterality Date   CYSTOSCOPY N/A 06/13/2013   Procedure: CYSTOSCOPY FLEXIBLE BEDSIDE;  Surgeon: Ardis Hughs, MD;  Location: [facility];  Service: Urology;  Laterality: N/A;   MYRINGOTOMY WITH TUBE PLACEMENT Bilateral    TRANSURETHRAL RESECTION OF PROSTATE N/A 05/19/2013   Procedure: TRANSURETHRAL RESECTION OF THE PROSTATE WITH GYRUS INSTRUMENTS;  Surgeon: Ailene Rud, MD;  Location: [facility];  Service: Urology;  Laterality: N/A;  Family History  Problem Relation Age of Onset   Diabetes Mother    Heart disease Mother    Hypertension Mother    Heart disease Father    Hypertension Father    Hypertension Daughter    Thyroid disease Daughter     Social History   Tobacco Use   Smoking status: Never   Smokeless tobacco: Never  Vaping Use   Vaping Use: Never used  Substance Use Topics   Alcohol use: No    Alcohol/week: 0.0 standard drinks   Drug  use: No    Home Medications Prior to Admission medications   Medication Sig Start Date End Date Taking? Authorizing Provider  amLODipine (NORVASC) 5 MG tablet Take 1 tablet (5 mg total) by mouth 2 (two) times daily. 01/13/21 01/13/22  [name], DO  aspirin 81 MG EC tablet Take 1 tablet (81 mg total) by mouth daily. 07/23/20   Maudie Mercury, MD  atorvastatin (LIPITOR) 20 MG tablet Take 1 tablet (20 mg total) by mouth daily. 09/16/20   [name], DO  furosemide (LASIX) 40 MG tablet Take 1 tablet (40 mg total) by mouth daily as needed for fluid. 07/23/20   Maudie Mercury, MD  iron polysaccharides (NIFEREX) 150 MG capsule Take 1 capsule (150 mg total) by mouth every other day. 10/25/20 11/24/20  Gaylan Gerold, DO  sulfamethoxazole-trimethoprim (BACTRIM DS) 800-160 MG tablet Take 1 tablet by mouth 2 (two) times daily. 02/11/21   Lajean Manes, MD  triamcinolone ointment (KENALOG) 0.1 % Apply 1 application topically 2 (two) times daily as needed (For leg rash). 07/23/20   Maudie Mercury, MD    Allergies    Patient has no known allergies.  Review of Systems   Review of Systems  Unable to perform ROS: Dementia   Physical Exam Updated Vital Signs BP 128/80   Pulse 79   Temp 100.2 F (37.9 C) (Rectal)   Resp (!) 21   SpO2 96%   Physical Exam Vitals and nursing note reviewed.  Constitutional:      Appearance: He is well-developed. He is ill-appearing.  HENT:     Head: Normocephalic and atraumatic.  Eyes:     Conjunctiva/sclera: Conjunctivae normal.  Cardiovascular:     Rate and Rhythm: Regular rhythm. Tachycardia present.  Pulmonary:     Breath sounds: Decreased air movement present.  Abdominal:     General: There is no distension.  Skin:    General: Skin is warm and dry.     Comments: No obvious breakdown  Neurological:     Mental Status: He is alert.     Comments: Moves all extremity spontaneously, oriented x2 face is symmetric speech is delayed, clear as abnormal  normal Patient found to be febrile.  With patient found to be febrile, started on Tylenol  Psychiatric:        Behavior: Behavior is slowed and withdrawn.    ED Results / Procedures / Treatments   Labs (all labs ordered are listed, but only abnormal results are displayed) Labs Reviewed  CBC WITH DIFFERENTIAL/PLATELET - Abnormal; Notable for the following components:      Result Value   WBC 12.7 (*)    RBC 3.87 (*)    Hemoglobin 11.1 (*)    HCT 36.2 (*)    Neutro Abs 11.6 (*)    Lymphs Abs 0.4 (*)    Abs Immature Granulocytes 0.08 (*)    All other components within normal limits  URINALYSIS, ROUTINE W REFLEX MICROSCOPIC -  Abnormal; Notable for the following components:   Hgb urine dipstick SMALL (*)    Protein, ur 30 (*)    Leukocytes,Ua MODERATE (*)    Bacteria, UA RARE (*)    All other components within normal limits  I-STAT CHEM 8, ED - Abnormal; Notable for the following components:   Creatinine, Ser 1.40 (*)    Glucose, Bld 100 (*)    Hemoglobin 11.6 (*)    HCT 34.0 (*)    All other components within normal limits  RESP PANEL BY RT-PCR (FLU A&B, COVID) ARPGX2  CULTURE, BLOOD (ROUTINE X 2)  CULTURE, BLOOD (ROUTINE X 2)  URINE CULTURE  MRSA NEXT GEN BY PCR, NASAL  LACTIC ACID, PLASMA  PROTIME-INR  APTT  COMPREHENSIVE METABOLIC PANEL    EKG EKG Interpretation  Date/Time:  Saturday February 12 2021 12:00:01 EST Ventricular Rate:  98 PR Interval:  192 QRS Duration: 113 QT Interval:  353 QTC Calculation: 451 R Axis:   -29 Text Interpretation: Sinus rhythm Borderline intraventricular conduction delay No significant change since last tracing Abnormal ECG Confirmed by Carmin Muskrat [phone]) on 02/12/2021 1:20:57 PM  Radiology DG Chest Port 1 View  Result Date: 02/12/2021 CLINICAL DATA:  Questionable sepsis. EXAM: PORTABLE CHEST 1 VIEW COMPARISON:  October 14, 2020 FINDINGS: Cardiomediastinal silhouette is stable. Incomplete inspiration. Low lung volumes. Increased  interstitial markings with scattered interstitial and airspace opacities throughout both lungs, right greater than left. Osseous structures are without acute abnormality. Soft tissues are grossly normal. IMPRESSION: Increased interstitial markings with scattered interstitial and airspace opacities throughout both lungs, right greater than left. Findings may represent mixed pattern pulmonary edema or atypical pneumonia. Electronically Signed   By: Fidela Salisbury M.D.   On: 02/12/2021 12:45    Procedures Procedures   Medications Ordered in ED Medications  vancomycin (VANCOREADY) IVPB 1750 mg/350 mL (1,750 mg Intravenous New Bag/Given 02/12/21 1406)  vancomycin (VANCOREADY) IVPB 1250 mg/250 mL (has no administration in time range)  acetaminophen (TYLENOL) tablet 1,000 mg (1,000 mg Oral Given 02/12/21 1216)  ceFEPIme (MAXIPIME) 2 g in sodium chloride 0.9 % 100 mL IVPB (0 g Intravenous Stopped 02/12/21 1443)    ED Course  I have reviewed the triage vital signs and the nursing notes.  Pertinent labs & imaging results that were available during my care of the patient were reviewed by me and considered in my medical decision making (see chart for details).  After initial evaluation, with notation of hypoxia patient required 2 L to achieve saturation of 98% abnormal. Cardiac and 95 sinus  Patient febrile, tylenol provided. Update: Patient accompanied by his daughter.  She relates the history of recent presumed UTI diagnosis, initiation of antibiotics, but cannot specify which 1.  Patient found to be febrile here, as above, and now on reviewing his labs, x-rays found of new oxygen requirement, 2 L with evidence for multifocal pneumonia.  COVID test is negative, urinalysis consistent with infection as well.  Patient is febrile, with leukocytosis, tachypnea, meets SIRS criteria, no early evidence for septic shock, however.  Patient will require ongoing fluid resuscitation, broad-spectrum antibiotics,  monitoring, management. I discussed this case with our internal medicine clinic colleagues for admission. After discussed goals of care with the patient's daughter.  She confirms the patient is DNR. MDM Rules/Calculators/A&P MDM Number of Diagnoses or Management Options HCAP (healthcare-associated pneumonia): new, needed workup SIRS (systemic inflammatory response syndrome) ([place]): new, needed workup Weakness: new, needed workup   Amount and/or Complexity of Data Reviewed Clinical  lab tests: ordered and reviewed Tests in the radiology section of CPT: ordered and reviewed Tests in the medicine section of CPT: reviewed and ordered Decide to obtain previous medical records or to obtain history from someone other than the patient: yes Obtain history from someone other than the patient: yes Review and summarize past medical records: yes Discuss the patient with other providers: yes Independent visualization of images, tracings, or specimens: yes  Risk of Complications, Morbidity, and/or Mortality Presenting problems: high Diagnostic procedures: high Management options: high  Critical Care Total time providing critical care: 30-74 minutes (35)  Patient Progress Patient progress: stable   Final Clinical Impression(s) / ED Diagnoses Final diagnoses:  SIRS (systemic inflammatory response syndrome) ([place])  Weakness  HCAP (healthcare-associated pneumonia)     Carmin Muskrat, MD 02/12/21 1553

## 2021-02-12 NOTE — ED Triage Notes (Signed)
Pt BIB [facility] from home. Pt began taking abx yesterday. Daughter reports increased generalized weakness today with persistent fever.  HR 104, BP 145/88, CBG 127, SpO2 98% RA, RR 18

## 2021-02-12 NOTE — ED Notes (Signed)
Pt SpO2 90% on room air. Pt placed on 2L via [place]. SpO2 94% and above on 2L.

## 2021-02-12 NOTE — H&P (Signed)
Date: 02/12/2021               Patient Name:  [name] MRN: [id number]  DOB: 09-14-1924 Age / Sex: 87 y.o., male   PCP: [name], DO         Medical Service: Internal Medicine Teaching Service         Attending Physician: Dr. Jimmye Norman, Elaina Pattee, MD    First Contact: Delene Ruffini, MD Pager: Lysbeth Penner [phone]  Second Contact: Jeanie Cooks, MD Pager: Governor Rooks [phone]       After Hours (After 5p/  First Contact Pager: [phone]  weekends / holidays): Second Contact Pager: [phone]   SUBJECTIVE   Chief Complaint: Odd behavior  History of Present Illness: Patient is brought to the emergency department by EMS, after daughter reported increasingly bizarre behavior over the past month.  She reports that beginning roughly a month ago the patient began intermittently urinating on himself overnight.  She does endorse some fevers occasionally but has not taken his temperature.  Per daughter patient has not complained of any burning or difficulty with urination.  Over this time she has also noticed that he has had a slight cough but has not produced any sputum.  Due to his increasingly odd behavior including changes in mood such as increased aggression recently, she took him to the outpatient clinic where he was evaluated and worked up.  Patient has not been having any nausea vomiting or diarrhea, and has denied any chest pain or abdominal pain.  Suspected UTI at that time and was started on Bactrim.  Daughter reports that she administered 1 dose of antibiotic before calling EMS due to deterioration in appearance.   ED Course: During evaluation in the emergency department, patient was noted to be hypoxic requiring 2 L to achieve saturation 98%.  Evidence of multifocal pneumonia on chest x-ray.  Blood cultures were obtained, and he was given a dose of Vanco and cefepime.   Meds:  No outpatient medications have been marked as taking for the 02/12/21 encounter [facility] Encounter).    Past Medical  History:  Diagnosis Date   Anemia    BPH (benign prostatic hypertrophy)    TURP 05/19/13   Cerebral embolism with cerebral infarction ([place]) 12/19/2013   Cerebrovascular accident (CVA) ([place]) 04/17/2019   Chronic kidney disease    CHRONIC KIDNEY DISEASE, 2   COVID-19 03/19/2020   1/7- 03/26/2020 presentation of generalized weakness.  Treated with Decadron. CRP peaked at 2.3 and was 0.6 at discharge.  No D-dimer performed.   Difficulty hearing, right    BILATERAL HEARING LOSS - BEST TO TRY TO SPEAK INTO LEFT EAR   DVT (deep venous thrombosis) (HCC)    Frequent falls    History of DVT (deep vein thrombosis) 06/10/2013   Provoked s/p TURP. Dx per doppler 06/10/13. Complicated by hematuria while on anticoagulation s/p TURP. Initially on lovenox and coumadin, then stopped, then IVC filter placed 06/16/13. Anticoagulation restarted after hematuria stopped. Lovenox was discontinued apparently on 07/29/13 with comment on high risk for falls.   Total ~1.5 months of anticoagulation but interrupted with bleeding complication.   Hyperlipidemia    Hypertension    Incontinence of urine    SOME INCONTINENCE   Left adrenal mass ([place]) 09/11/2013   Meningioma ([place])    Stroke ([place])    Cerebellar, 2013; WALKS WITH WALKER, ABLE TO DRESS AND BATHE HIMSELF BUT FAMILY TRIES TO PROVIDE SUPERVISION BECAUSE OF HIS HX OF FALL AND WEAKNESS LEGS, ARMS  Thrombocytopenia ([place])    Vitreous hemorrhage ([place]) 12/21/2013   OS 12/18/13 CT /MRI brain done for ataxia     Past Surgical History:  Procedure Laterality Date   CYSTOSCOPY N/A 06/13/2013   Procedure: CYSTOSCOPY FLEXIBLE BEDSIDE;  Surgeon: Ardis Hughs, MD;  Location: [place];  Service: Urology;  Laterality: N/A;   MYRINGOTOMY WITH TUBE PLACEMENT Bilateral    TRANSURETHRAL RESECTION OF PROSTATE N/A 05/19/2013   Procedure: TRANSURETHRAL RESECTION OF THE PROSTATE WITH GYRUS INSTRUMENTS;  Surgeon: Ailene Rud, MD;  Location: [facility];  Service: Urology;  Laterality: N/A;     Social:  Lives With: Daughter Occupation: None Support: Family Level of Function: Able to ambulate with walker, feed self, wash self PCP: Joni Reining, MD Substances: None  Family History: n/a  Allergies: Allergies as of 02/12/2021   (No Known Allergies)    Review of Systems: A complete ROS was negative except as per HPI.   OBJECTIVE:   Physical Exam: Blood pressure 139/81, pulse 78, temperature 100.2 F (37.9 C), temperature source Rectal, resp. rate (!) 25, SpO2 96 %.  Constitutional: Ill-appearing lying in bed, in no acute distress HENT: normocephalic atraumatic, mucous membranes moist Eyes: conjunctiva non-erythematous Neck: supple Cardiovascular: Tachycardic Pulmonary/Chest: Decreased air movement, normal work of breathing, however he is requiring 2 L nasal cannula Abdominal: soft, non-tender, non-distended MSK: normal bulk and tone Neurological: Moves all extremities spontaneously left more so than right.  Speech is garbled difficult to understand, no obvious facial droop Skin: warm and dry, xerosis lower extremities vitiligo bilateral lower extremities, Psych: Mood appropriate difficult to gain insight into thought process  Labs: CBC    Component Value Date/Time   WBC 12.7 (H) 02/12/2021 1156   RBC 3.87 (L) 02/12/2021 1156   HGB 11.6 (L) 02/12/2021 1511   HGB 11.4 (L) 02/10/2021 1630   HCT 34.0 (L) 02/12/2021 1511   HCT 35.6 (L) 02/10/2021 1630   PLT 176 02/12/2021 1156   PLT 190 02/10/2021 1630   MCV 93.5 02/12/2021 1156   MCV 87 02/10/2021 1630   MCH 28.7 02/12/2021 1156   MCHC 30.7 02/12/2021 1156   RDW 15.3 02/12/2021 1156   RDW 13.6 02/10/2021 1630   LYMPHSABS 0.4 (L) 02/12/2021 1156   LYMPHSABS 1.3 01/13/2021 1156   MONOABS 0.5 02/12/2021 1156   EOSABS 0.2 02/12/2021 1156   EOSABS 0.2 01/13/2021 1156   BASOSABS 0.0 02/12/2021 1156   BASOSABS 0.0 01/13/2021 1156     CMP     Component Value Date/Time   NA 145 02/12/2021 1511   NA 149  (H) 02/10/2021 1630   K 4.0 02/12/2021 1511   CL 111 02/12/2021 1511   CO2 26 02/12/2021 1416   GLUCOSE 100 (H) 02/12/2021 1511   BUN 20 02/12/2021 1511   BUN 23 02/10/2021 1630   CREATININE 1.40 (H) 02/12/2021 1511   CREATININE 0.96 01/15/2014 1648   CALCIUM 9.6 02/12/2021 1416   PROT 6.3 (L) 02/12/2021 1416   ALBUMIN 2.7 (L) 02/12/2021 1416   AST 15 02/12/2021 1416   ALT 13 02/12/2021 1416   ALKPHOS 45 02/12/2021 1416   BILITOT 0.4 02/12/2021 1416   GFRNONAA 47 (L) 02/12/2021 1416   GFRNONAA 71 01/15/2014 1648   GFRAA 57 (L) 09/16/2019 1520   GFRAA 82 01/15/2014 1648    Imaging: DG Chest Port 1 View  Result Date: 02/12/2021 CLINICAL DATA:  Questionable sepsis. EXAM: PORTABLE CHEST 1 VIEW COMPARISON:  October 14, 2020 FINDINGS: Cardiomediastinal silhouette is stable. Incomplete  inspiration. Low lung volumes. Increased interstitial markings with scattered interstitial and airspace opacities throughout both lungs, right greater than left. Osseous structures are without acute abnormality. Soft tissues are grossly normal. IMPRESSION: Increased interstitial markings with scattered interstitial and airspace opacities throughout both lungs, right greater than left. Findings may represent mixed pattern pulmonary edema or atypical pneumonia. Electronically Signed   By: Fidela Salisbury M.D.   On: 02/12/2021 12:45    EKG: personally reviewed my interpretation is normal sinus   ASSESSMENT & PLAN:    Assessment & Plan by Problem: Principal Problem:   CAP (community acquired pneumonia)   [name] is a 87 y.o. with pertinent PMH of CVA, frequent falls, hyperlipidemia, hypertension who presented with altered mental status and admitted for acute encephalopathy on hospital day 0  Acute encephalopathy Current differentials include infectious versus new CVA Patient has multiple potential infectious etiologies to explain acute encephalopathy including pneumonia and UTI.  Chest x-ray  showing possible atypical pneumonia, and patient has reported cough.  Furthermore he was recently evaluated in the clinic for presumed UTI with initial UA showing many bacteria. No concern for sepsis at this time.  Lactic acid within normal limits and vital signs have been stable. No metabolic causes identified on CMP -Patient has a history of CVA as well, will obtain head CT to rule out CVA as cause for his acute encephalopathy. -Blood cultures were obtained, will need to follow-up - Patient will be n.p.o. for now  #Atypical pneumonia Elevated white blood cell count, low-grade temperature New oxygen requirement of 2 L satting 96% Chest x-ray showing scattered interstitial and airspace opacities bilaterally right greater than left - Respiratory panel negative for influenza and COVID He has been started on azithromycin and ceftriaxone for atypical pneumonia. -Continue antibiotics - Continue supplemental oxygen and wean as tolerated  Urinary tract infection Patient has been intermittent bladder and incontinence for the past month roughly.  He was denying any pain or burning with urination.  And was seen in the clinic 2 days ago for presumed urinary tract infection. UA obtained 12/1 showing many bacteria present and he was started on Bactrim at that time Urinalysis obtained in the ED showing moderate leukocytes with rare bacteria present.   -Urine culture has been collected and will need to be followed up. -Continue antibiotics.  #Acute kidney injury on chronic kidney disease Baseline appears to be 1.1, today 1.4.  GFR 47 down from 59  #Chronic problems Hyperlipidemia, hypertension Will continue these medications for now  Diet: NPO VTE: Heparin IVF: None,None Code: DNR  Prior to Admission Living Arrangement: Home, living with daughter Anticipated Discharge Location: Home Barriers to Discharge: Medical stability  Dispo: Admit patient to Observation with expected length of stay less  than 2 midnights.  Signed: Delene Ruffini, MD Internal Medicine Resident PGY-1 Pager: [phone]  02/12/2021, 7:31 PM

## 2021-02-13 DIAGNOSIS — R0902 Hypoxemia: Secondary | ICD-10-CM | POA: Diagnosis present

## 2021-02-13 DIAGNOSIS — I69351 Hemiplegia and hemiparesis following cerebral infarction affecting right dominant side: Secondary | ICD-10-CM | POA: Diagnosis not present

## 2021-02-13 DIAGNOSIS — J189 Pneumonia, unspecified organism: Secondary | ICD-10-CM | POA: Diagnosis not present

## 2021-02-13 DIAGNOSIS — Z8249 Family history of ischemic heart disease and other diseases of the circulatory system: Secondary | ICD-10-CM | POA: Diagnosis not present

## 2021-02-13 DIAGNOSIS — L89626 Pressure-induced deep tissue damage of left heel: Secondary | ICD-10-CM | POA: Diagnosis present

## 2021-02-13 DIAGNOSIS — Z20822 Contact with and (suspected) exposure to covid-19: Secondary | ICD-10-CM | POA: Diagnosis present

## 2021-02-13 DIAGNOSIS — Z66 Do not resuscitate: Secondary | ICD-10-CM | POA: Diagnosis present

## 2021-02-13 DIAGNOSIS — I129 Hypertensive chronic kidney disease with stage 1 through stage 4 chronic kidney disease, or unspecified chronic kidney disease: Secondary | ICD-10-CM | POA: Diagnosis present

## 2021-02-13 DIAGNOSIS — E785 Hyperlipidemia, unspecified: Secondary | ICD-10-CM | POA: Diagnosis present

## 2021-02-13 DIAGNOSIS — N179 Acute kidney failure, unspecified: Secondary | ICD-10-CM | POA: Diagnosis present

## 2021-02-13 DIAGNOSIS — N400 Enlarged prostate without lower urinary tract symptoms: Secondary | ICD-10-CM | POA: Diagnosis present

## 2021-02-13 DIAGNOSIS — R296 Repeated falls: Secondary | ICD-10-CM | POA: Diagnosis present

## 2021-02-13 DIAGNOSIS — N1831 Chronic kidney disease, stage 3a: Secondary | ICD-10-CM | POA: Diagnosis present

## 2021-02-13 DIAGNOSIS — Z833 Family history of diabetes mellitus: Secondary | ICD-10-CM | POA: Diagnosis not present

## 2021-02-13 DIAGNOSIS — Z9079 Acquired absence of other genital organ(s): Secondary | ICD-10-CM | POA: Diagnosis not present

## 2021-02-13 DIAGNOSIS — R531 Weakness: Secondary | ICD-10-CM | POA: Diagnosis present

## 2021-02-13 DIAGNOSIS — G9349 Other encephalopathy: Secondary | ICD-10-CM | POA: Diagnosis present

## 2021-02-13 DIAGNOSIS — R21 Rash and other nonspecific skin eruption: Secondary | ICD-10-CM | POA: Diagnosis present

## 2021-02-13 DIAGNOSIS — E669 Obesity, unspecified: Secondary | ICD-10-CM | POA: Diagnosis present

## 2021-02-13 DIAGNOSIS — Z86011 Personal history of benign neoplasm of the brain: Secondary | ICD-10-CM | POA: Diagnosis not present

## 2021-02-13 DIAGNOSIS — Z86718 Personal history of other venous thrombosis and embolism: Secondary | ICD-10-CM | POA: Diagnosis not present

## 2021-02-13 DIAGNOSIS — F0150 Vascular dementia without behavioral disturbance: Secondary | ICD-10-CM | POA: Diagnosis present

## 2021-02-13 DIAGNOSIS — Z8616 Personal history of COVID-19: Secondary | ICD-10-CM | POA: Diagnosis not present

## 2021-02-13 DIAGNOSIS — N390 Urinary tract infection, site not specified: Secondary | ICD-10-CM | POA: Diagnosis present

## 2021-02-13 DIAGNOSIS — Z6828 Body mass index (BMI) 28.0-28.9, adult: Secondary | ICD-10-CM | POA: Diagnosis not present

## 2021-02-13 LAB — URINE CULTURE

## 2021-02-13 LAB — BASIC METABOLIC PANEL
Anion gap: 6 (ref 5–15)
BUN: 21 mg/dL (ref 8–23)
CO2: 25 mmol/L (ref 22–32)
Calcium: 9.5 mg/dL (ref 8.9–10.3)
Chloride: 112 mmol/L — ABNORMAL HIGH (ref 98–111)
Creatinine, Ser: 1.58 mg/dL — ABNORMAL HIGH (ref 0.61–1.24)
GFR, Estimated: 40 mL/min — ABNORMAL LOW (ref >60)
Glucose, Bld: 108 mg/dL — ABNORMAL HIGH (ref 70–99)
Potassium: 3.9 mmol/L (ref 3.5–5.1)
Sodium: 143 mmol/L (ref 135–145)

## 2021-02-13 LAB — CBC
HCT: 34.2 % — ABNORMAL LOW (ref 39.0–52.0)
Hemoglobin: 10.3 g/dL — ABNORMAL LOW (ref 13.0–17.0)
MCH: 28.3 pg (ref 26.0–34.0)
MCHC: 30.1 g/dL (ref 30.0–36.0)
MCV: 94 fL (ref 80.0–100.0)
Platelets: 159 10*3/uL (ref 150–400)
RBC: 3.64 MIL/uL — ABNORMAL LOW (ref 4.22–5.81)
RDW: 15.1 % (ref 11.5–15.5)
WBC: 10.1 10*3/uL (ref 4.0–10.5)
nRBC: 0 % (ref 0.0–0.2)

## 2021-02-13 MED ORDER — LACTATED RINGERS IV SOLN: INTRAVENOUS | Status: AC

## 2021-02-13 NOTE — Progress Notes (Signed)
HD#0 SUBJECTIVE:  Patient Summary: [name] is a 87 y.o. with pertinent PMH of CVA, frequent falls, hyperlipidemia, hypertension who presented with altered mental status and admitted for acute encephalopathy on hospital day 0  Overnight Events: no acute events overnight.     Interm History: patient seen and evalluated at bedside. No complaints.   OBJECTIVE:  Vital Signs: Vitals:   02/13/21 1015 02/13/21 1100 02/13/21 1200 02/13/21 1515  BP: 121/71 (!) 143/85 (!) 148/90 (!) 143/75  Pulse: 71 72 76 76  Resp: 20 18 (!) 22 20  Temp:    98.5 F (36.9 C)  TempSrc:    Oral  SpO2: 91% 95% 98% 93%  Height:    5\' 9"  (1.753 m)   Supplemental O2: Nasal Cannula SpO2: 93 % O2 Flow Rate (L/min): 2 L/min  There were no vitals filed for this visit.   Intake/Output Summary (Last 24 hours) at 02/13/2021 1741 Last data filed at 02/13/2021 1623 Gross per 24 hour  Intake 1447.4 ml  Output --  Net 1447.4 ml   Net IO Since Admission: 1,447.4 mL [02/13/21 1741]  Physical Exam: Physical Exam Constitutional:      General: He is not in acute distress.    Appearance: He is obese. He is not ill-appearing, toxic-appearing or diaphoretic.  HENT:     Head: Atraumatic.  Eyes:     Extraocular Movements: Extraocular movements intact.     Pupils: Pupils are equal, round, and reactive to light.  Cardiovascular:     Rate and Rhythm: Normal rate and regular rhythm.  Pulmonary:     Effort: Pulmonary effort is normal.     Comments: Rales at bases bilaterlly Abdominal:     General: Abdomen is flat.     Palpations: Abdomen is soft.  Musculoskeletal:        General: No swelling, tenderness, deformity or signs of injury.     Cervical back: Normal range of motion.  Skin:    General: Skin is warm and dry.     Findings: Rash present.     Comments: Bilateral hypopigmented non scaling patches bilateral lower extremities, bilateral erythematous maculopapular rash bilateral lower extremities  worse around ankles. Xerosis. Loos skin left heel indicative of pressure wound. Onychomycosis.   Neurological:     Mental Status: He is alert. Mental status is at baseline.     Sensory: No sensory deficit.  Psychiatric:        Behavior: Behavior normal.    Patient Lines/Drains/Airways Status     Active Line/Drains/Airways     Name Placement date Placement time Site Days   Peripheral IV 02/12/21 20 G Posterior;Right Hand 02/12/21  0000  Hand  1   Peripheral IV 02/12/21 22 G Left;Posterior Hand 02/12/21  0000  Hand  1   External Urinary Catheter 02/12/21  1156  --  1            Pertinent Labs: CBC Latest Ref Rng & Units 02/13/2021 02/12/2021 02/12/2021  WBC 4.0 - 10.5 K/uL 10.1 - 12.7(H)  Hemoglobin 13.0 - 17.0 g/dL 10.3(L) 11.6(L) 11.1(L)  Hematocrit 39.0 - 52.0 % 34.2(L) 34.0(L) 36.2(L)  Platelets 150 - 400 K/uL 159 - 176    CMP Latest Ref Rng & Units 02/13/2021 02/12/2021 02/12/2021  Glucose 70 - 99 mg/dL 108(H) 100(H) 104(H)  BUN 8 - 23 mg/dL 21 20 20   Creatinine 0.61 - 1.24 mg/dL 1.58(H) 1.40(H) 1.38(H)  Sodium 135 - 145 mmol/L 143 145 143  Potassium  3.5 - 5.1 mmol/L 3.9 4.0 4.0  Chloride 98 - 111 mmol/L 112(H) 111 111  CO2 22 - 32 mmol/L 25 - 26  Calcium 8.9 - 10.3 mg/dL 9.5 - 9.6  Total Protein 6.5 - 8.1 g/dL - - 6.3(L)  Total Bilirubin 0.3 - 1.2 mg/dL - - 0.4  Alkaline Phos 38 - 126 U/L - - 45  AST 15 - 41 U/L - - 15  ALT 0 - 44 U/L - - 13    No results for input(s): GLUCAP in the last 72 hours.   Pertinent Imaging: CT HEAD CODE STROKE WO CONTRAST  Result Date: 02/12/2021 CLINICAL DATA:  Code stroke.  Weakness and fever EXAM: CT HEAD WITHOUT CONTRAST TECHNIQUE: Contiguous axial images were obtained from the base of the skull through the vertex without intravenous contrast. COMPARISON:  None. FINDINGS: Brain: 2.6 x 2.0 cm anterior cranial fossa meningioma is unchanged. There is unchanged widespread periventricular white matter hypodensity and an old right occipital  infarct. There is generalized atrophy without lobar predilection. No acute hemorrhage. Advanced right cerebellar encephalomalacia. Vascular: No abnormal hyperdensity of the major intracranial arteries or dural venous sinuses. No intracranial atherosclerosis. Skull: The visualized skull base, calvarium and extracranial soft tissues are normal. Sinuses/Orbits: No fluid levels or advanced mucosal thickening of the visualized paranasal sinuses. No mastoid or middle ear effusion. The orbits are normal. ASPECTS [facility] Stroke Program Early CT Score) - Ganglionic level infarction (caudate, lentiform nuclei, internal capsule, insula, M1-M3 cortex): 7 - Supraganglionic infarction (M4-M6 cortex): 3 Total score (0-10 with 10 being normal): 10 IMPRESSION: 1. No acute intracranial abnormality. 2. ASPECTS is 10. 3. Unchanged anterior cranial fossa meningioma and chronic ischemic microangiopathy. Electronically Signed   By: Ulyses Jarred M.D.   On: 02/12/2021 19:59    ASSESSMENT/PLAN:  Assessment: Principal Problem:   CAP (community acquired pneumonia) Active Problems:   BPH (benign prostatic hyperplasia)   CKD (chronic kidney disease) stage 3, GFR 30-59 ml/min (HCC)   Vascular dementia ([place])   UTI (urinary tract infection)   [name] is a 87 y.o. with pertinent PMH of CVA with reight sided residual weakness, HLD, HTN who presented with increased vocalization and posturing changes and admit for acute encephalopathy on hospital day 0  Plan: Acute encephalopathy Appears to be resolving, if not resolved. Unsure how close to baseline he is.  Current differentials include infectious versus new CVA Patient has multiple potential infectious etiologies to explain acute encephalopathy including pneumonia and UTI.  Chest x-ray showing possible atypical pneumonia, and patient has reported cough.  Furthermore he was recently evaluated in the clinic for presumed UTI with initial UA showing many bacteria. No concern  for sepsis at this time.  Lactic acid within normal limits and vital signs have been stable. No metabolic causes identified on CMP -Patient has a history of CVA as well, will obtain head CT to rule out CVA as cause for his acute encephalopathy. -Blood cultures were obtained, will need to follow-up - Patient will be n.p.o. for now - PT/OT evaluated and rec SNF placement.   #Atypical pneumonia Elevated white blood cell count, low-grade temperature New oxygen requirement of 2 L satting 96% Chest x-ray showing scattered interstitial and airspace opacities bilaterally right greater than left - Respiratory panel negative for influenza and COVID He has been started on azithromycin and ceftriaxone for atypical pneumonia. -Continue antibiotics - Continue supplemental oxygen and wean as tolerated  Urinary tract infection Patient has been intermittent bladder and incontinence for  the past month roughly.  He was denying any pain or burning with urination.  And was seen in the clinic 2 days ago for presumed urinary tract infection. UA obtained 12/1 showing many bacteria present and he was started on Bactrim at that time Urinalysis obtained in the ED showing moderate leukocytes with rare bacteria present.   -Urine culture has been collected and will need to be followed up. -Continue antibiotics.   #Acute kidney injury on chronic kidney disease Renal function continues to worsen, started on continuous IV fluid at a rate of 125 mils per hour for 10 hours. Patient has been able to pass urine, bladder scan done with 60 mL residual. We will continue to monitor renal function with fluid administration, if no improvement can consider renal ultrasound.   #Chronic problems Hyperlipidemia, hypertension Will continue these medications for now  Best Practice: VTE: heparin injection 5,000 Units Start: 02/12/21 2200 Code: DNR   Signature: Delene Ruffini, MD  Internal Medicine Resident, PGY-1 [facility]  Internal Medicine Residency  Pager: [phone] 5:41 PM, 02/13/2021   Please contact the on call pager after 5 pm and on weekends at [phone].

## 2021-02-13 NOTE — TOC Initial Note (Signed)
Transition of Care [facility]) - Initial/Assessment Note    Patient Details  Name: [name] MRN: [id number] Date of Birth: 08-07-1924  Transition of [facility]) CM/SW Contact:    Verdell Carmine, RN Phone Number: 02/13/2021, 11:14 AM  Clinical Narrative:                 87 yo patient presented to the ED for weakness, progressive. Baseline is walking with RW, lives with daughter. PT and OT consults done, recommendation of SNF. According to PT daughter would prefer [facility] or [facility]. PT will continue to monitor and work with patient while in hospital.  CSW to follow for needs, further recommendations, and transitions  Expected Discharge Plan: [place] Barriers to Discharge: Continued Medical Work up   Patient Goals and CMS Choice        Expected Discharge Plan and Services Expected Discharge Plan: [place] In-house Referral: Clinical Social Work     Living arrangements for the past 2 months: [place]                                      Prior Living Arrangements/Services Living arrangements for the past 2 months: Single Family Home Lives with:: Adult Children Patient language and need for interpreter reviewed:: Yes        Need for Family Participation in Patient Care: Yes (Comment) Care giver support system in place?: Yes (comment)   Criminal Activity/Legal Involvement Pertinent to Current Situation/Hospitalization: No - Comment as needed  Activities of Daily Living      Permission Sought/Granted                  Emotional Assessment       Orientation: : Oriented to Self Alcohol / Substance Use: Not Applicable Psych Involvement: No (comment)  Admission diagnosis:  CAP (community acquired pneumonia) [J18.9] Patient Active Problem List   Diagnosis Date Noted   CAP (community acquired pneumonia) 02/12/2021   UTI (urinary tract infection) 02/10/2021   Nocturia 01/14/2021   Diverticulosis of colon  07/19/2020   Thrombocytopenia ([place]) 03/30/2020   Partial symptomatic epilepsy with complex partial seizures, not intractable, without status epilepticus ([place]) 04/17/2019   Chronic diastolic (congestive) heart failure ([place]) 10/28/2017   Cerebellar ataxia in diseases classified elsewhere ([place]) 02/13/2017   Vascular dementia ([place]) 01/28/2016   Presence of IVC filter 07/17/2014   CKD (chronic kidney disease) stage 3, GFR 30-59 ml/min ([place]) 06/23/2013   Hyperlipidemia    BPH (benign prostatic hyperplasia) 04/24/2013   Hearing loss 07/20/2011   Meningioma ([place]) 07/20/2011   HTN (hypertension) 07/19/2011   PCP:  [name], DO Pharmacy:   [facility] [place], [place] [address] [place] [address] Phone: [phone] Fax: [phone]  [facility] [place], [address] [place] [address] Phone: [phone] Fax: [phone]     Social Determinants of Health (SDOH) Interventions    Readmission Risk Interventions No flowsheet data found.

## 2021-02-13 NOTE — Evaluation (Signed)
Physical Therapy Evaluation Patient Details Name: [name] MRN: [id number] DOB: [birth year] Today's Date: 02/13/2021  History of Present Illness  49 admitted 12/3 with AMS and generalized weakness. Suspected pneumonia, PMH: CVA, CKD, Covid, HTN, Meningioma, Vitreous hemorrhage; frequent falls,  Clinical Impression   Received in ED stretcher, very pleasant and cooperative but difficult to communicate with due to [facility] and mumbled speech. Able to follow visual and tactile cues enough to safely participate in limited PT session. Able to stand at bedside but had extreme posterior lean/unsteadiness, reliant on leaning posteriorly on stretcher and on PT to maintain balance. Very high fall risk if left to his own devices. Left in ED stretcher positioned to comfort with all needs met. Per OT, who discussed case/reccs with family over phone, family would like him to go to SNF for ongoing rehab before return home- I certainly agree that this is appropriate.        Recommendations for follow up therapy are one component of a multi-disciplinary discharge planning process, led by the attending physician.  Recommendations may be updated based on patient status, additional functional criteria and insurance authorization.  Follow Up Recommendations Skilled nursing-short term rehab (<3 hours/day)    Assistance Recommended at Discharge Frequent or constant Supervision/Assistance  Functional Status Assessment Patient has had a recent decline in their functional status and demonstrates the ability to make significant improvements in function in a reasonable and predictable amount of time.  Equipment Recommendations  Rolling walker (2 wheels);BSC/3in1;Wheelchair (measurements PT);Wheelchair cushion (measurements PT)    Recommendations for Other Services       Precautions / Restrictions Precautions Precautions: Fall Precaution Comments: weak R side at baseline, HOH with L ear better (per  chart) Restrictions Weight Bearing Restrictions: No      Mobility  Bed Mobility Overal bed mobility: Needs Assistance Bed Mobility: Supine to Sit;Sit to Supine     Supine to sit: Mod assist;HOB elevated Sit to supine: Max assist   General bed mobility comments: assist mainly to bring BLEs to/from EOB and scoot hips so he was square at edge of stretcher    Transfers Overall transfer level: Needs assistance Equipment used: Standard walker Transfers: Sit to/from Stand Sit to Stand: Min assist           General transfer comment: MinA to boost from elevated stretcher, but then needed MaxA toi maintain balance due to extreme posterior lean. Reliant on leaning on stretcher and on PT assist to maintain balance    Ambulation/Gait               General Gait Details: deferred- will need +2 for safety  Stairs            Wheelchair Mobility    Modified Rankin (Stroke Patients Only)       Balance Overall balance assessment: Needs assistance Sitting-balance support: Feet unsupported;Bilateral upper extremity supported Sitting balance-Leahy Scale: Fair   Postural control: Posterior lean Standing balance support: Bilateral upper extremity supported;Reliant on assistive device for balance;During functional activity Standing balance-Leahy Scale: Poor                               Pertinent Vitals/Pain Pain Assessment: Faces Pain Score: 0-No pain Faces Pain Scale: No hurt Pain Location: generalized Pain Descriptors / Indicators: Discomfort Pain Intervention(s): Limited activity within patient's tolerance;Monitored during session    Home Living Family/patient expects to be discharged to:: Private residence Living Arrangements: Children Available  Help at Discharge: Available 24 hours/day Type of Home: House Home Access: Ramped entrance       Home Layout: One level Home Equipment: Conservation officer, nature (2 wheels);Wheelchair - manual;BSC/3in1;Shower  seat;Grab bars - tub/shower      Prior Function Prior Level of Function : Needs assist       Physical Assist : Mobility (physical);ADLs (physical) Mobility (physical): Transfers;Gait ADLs (physical): Bathing;Dressing;Toileting;IADLs   ADLs Comments: daughter helps for safety; pt has fear of falling at baseline     Hand Dominance   Dominant Hand: Right    Extremity/Trunk Assessment   Upper Extremity Assessment Upper Extremity Assessment: Defer to OT evaluation RUE Deficits / Details: using functionally    Lower Extremity Assessment Lower Extremity Assessment: Generalized weakness (R sided weakness secondary to hx of CVA)    Cervical / Trunk Assessment Cervical / Trunk Assessment: Kyphotic;Other exceptions Cervical / Trunk Exceptions: posterior bias  Communication   Communication: HOH  Cognition Arousal/Alertness: Awake/alert Behavior During Therapy: Flat affect Overall Cognitive Status: Difficult to assess                                 General Comments: hx of cognitive deficits; difficukt to assess due to [facility], also tends to mumble and can be difficult to understand        General Comments General comments (skin integrity, edema, etc.): VSS on 2LPM O2    Exercises     Assessment/Plan    PT Assessment Patient needs continued PT services  PT Problem List Decreased strength;Decreased cognition;Decreased knowledge of use of DME;Decreased activity tolerance;Decreased safety awareness;Decreased balance;Decreased coordination;Decreased mobility       PT Treatment Interventions DME instruction;Balance training;Gait training;Neuromuscular re-education;Stair training;Cognitive remediation;Functional mobility training;Patient/family education;Therapeutic activities;Therapeutic exercise    PT Goals (Current goals can be found in the Care Plan section)  Acute Rehab PT Goals Patient Stated Goal: go to rehab at SNF (per OT, family requesting rehab) PT Goal  Formulation: With family Time For Goal Achievement: 02/27/21 Potential to Achieve Goals: Good    Frequency Min 2X/week   Barriers to discharge        Co-evaluation               AM-PAC PT "6 Clicks" Mobility  Outcome Measure Help needed turning from your back to your side while in a flat bed without using bedrails?: A Little Help needed moving from lying on your back to sitting on the side of a flat bed without using bedrails?: A Lot Help needed moving to and from a bed to a chair (including a wheelchair)?: Total Help needed standing up from a chair using your arms (e.g., wheelchair or bedside chair)?: A Lot Help needed to walk in hospital room?: Total Help needed climbing 3-5 steps with a railing? : Total 6 Click Score: 10    End of Session Equipment Utilized During Treatment: Gait belt Activity Tolerance: Patient tolerated treatment well Patient left: in bed;with call bell/phone within reach (ED stretcher) Nurse Communication: Mobility status PT Visit Diagnosis: Unsteadiness on feet (R26.81);Difficulty in walking, not elsewhere classified (R26.2);Muscle weakness (generalized) (M62.81);Other abnormalities of gait and mobility (R26.89)    Time: 8127-5170 PT Time Calculation (min) (ACUTE ONLY): 11 min   Charges:   PT Evaluation $PT Eval Moderate Complexity: 1 Mod         Sunset Joshi U PT, DPT, PN2   Supplemental Physical Therapist [facility]    Pager [phone] Acute  Rehab Office [phone]

## 2021-02-13 NOTE — Evaluation (Signed)
Occupational Therapy Evaluation Patient Details Name: [name] MRN: [id number] DOB: 1924/05/27 Today's Date: 02/13/2021   History of Present Illness 72 admitted with AMS and generalized weakness. Suspected pneumonia, PMH: CVA, CKD, Covid, HTN, Meningioma, Vitreous hemorrhage; frequent falls,   Clinical Impression   PTA pt lives at home with daughter/family and at baseline is able to mobilize in his room @ RW level with S; primarily uses his wc @ modified independent level around the house. Daughter provides set up - min A for ADL tasks. Spoke with daughter over the phone and she states her father was getting "very weak" before coming to the hospital.  Pt required Max A for bed mobility and was unable to safely take steps at this time due to significant posterior lean and  deficits listed below. At this time recommend rehab at SNF due to decline in functional status. Daughter prefers for her Dad to go to [facility] or [facility] for rehab. If pt progresses during acute stay to be able to mobilize at [place] level, may be able to go home with [facility] services. Recommend pt mobilize OOB for all meals with nsg. Will follow acutely.      Recommendations for follow up therapy are one component of a multi-disciplinary discharge planning process, led by the attending physician.  Recommendations may be updated based on patient status, additional functional criteria and insurance authorization.   Follow Up Recommendations  Skilled nursing-short term rehab (<3 hours/day)    Assistance Recommended at Discharge Frequent or constant Supervision/Assistance  Functional Status Assessment  Patient has had a recent decline in their functional status and/or demonstrates limited ability to make significant improvements in function in a reasonable and predictable amount of time  Equipment Recommendations  None recommended by OT    Recommendations for Other Services       Precautions / Restrictions  Precautions Precautions: Fall Precaution Comments: wea R side at baseline      Mobility Bed Mobility Overal bed mobility: Needs Assistance Bed Mobility: Supine to Sit;Sit to Supine     Supine to sit: Max assist Sit to supine: Max assist        Transfers Overall transfer level: Needs assistance Equipment used: Rolling walker (2 wheels) Transfers: Sit to/from Stand Sit to Stand: Mod assist           General transfer comment: unable to take steps forward; took 3 stteps sideways with Max VC and mod physical assist      Balance Overall balance assessment: Needs assistance Sitting-balance support: Feet supported Sitting balance-Leahy Scale: Poor       Standing balance-Leahy Scale: Poor                             ADL either performed or assessed with clinical judgement   ADL Overall ADL's : Needs assistance/impaired Eating/Feeding: Set up   Grooming: Set up   Upper Body Bathing: Minimal assistance   Lower Body Bathing: Maximal assistance;Sit to/from stand       Lower Body Dressing: Total assistance   Toilet Transfer: Maximal assistance   Toileting- Clothing Manipulation and Hygiene: Total assistance       Functional mobility during ADLs: Maximal assistance;Rolling walker (2 wheels);Cueing for safety;Cueing for sequencing       Vision         Perception     Praxis      Pertinent Vitals/Pain Pain Assessment: Faces Faces Pain Scale: Hurts a  little bit Pain Location: generalized Pain Descriptors / Indicators: Discomfort Pain Intervention(s): Limited activity within patient's tolerance     Hand Dominance Right   Extremity/Trunk Assessment Upper Extremity Assessment Upper Extremity Assessment: Generalized weakness;RUE deficits/detail (weakness from previous stroke) RUE Deficits / Details: using functionally   Lower Extremity Assessment Lower Extremity Assessment: Defer to PT evaluation   Cervical / Trunk Assessment Cervical /  Trunk Assessment: Kyphotic;Other exceptions (posterior bias)   Communication Communication Communication: HOH   Cognition Arousal/Alertness: Awake/alert Behavior During Therapy: Flat affect Overall Cognitive Status: Difficult to assess                                 General Comments: hx of cognitive deficits; difficukt to assess due to [facility]     General Comments  2L; VSS    Exercises     Shoulder Instructions      Home Living Family/patient expects to be discharged to:: Private residence Living Arrangements: Children Available Help at Discharge: Available 24 hours/day Type of Home: House Home Access: Ramped entrance     Home Layout: One level     Bathroom Shower/Tub: Teacher, early years/pre: Standard Bathroom Accessibility: Yes How Accessible: Accessible via walker Home Equipment: [place] (2 wheels);Wheelchair - manual;BSC/3in1;Shower seat;Grab bars - tub/shower          Prior Functioning/Environment Prior Level of Function : Needs assist       Physical Assist : Mobility (physical);ADLs (physical) Mobility (physical): Transfers;Gait ADLs (physical): Bathing;Dressing;Toileting;IADLs   ADLs Comments: daughter helps for safety; pt has fear of falling at baseline        OT Problem List: Decreased strength;Decreased activity tolerance;Impaired balance (sitting and/or standing);Decreased cognition;Decreased safety awareness;Decreased knowledge of use of DME or AE;Cardiopulmonary status limiting activity;Pain      OT Treatment/Interventions: Self-care/ADL training;Therapeutic exercise;Energy conservation;DME and/or AE instruction;Therapeutic activities;Cognitive remediation/compensation;Balance training;Patient/family education    OT Goals(Current goals can be found in the care plan section) Acute Rehab OT Goals Patient Stated Goal: to get stronger before going back home OT Goal Formulation: With patient/family Time For Goal  Achievement: 02/27/21 Potential to Achieve Goals: Good  OT Frequency: Min 2X/week   Barriers to D/C:            Co-evaluation              AM-PAC OT "6 Clicks" Daily Activity     Outcome Measure Help from another person eating meals?: A Little Help from another person taking care of personal grooming?: A Little Help from another person toileting, which includes using toliet, bedpan, or urinal?: Total Help from another person bathing (including washing, rinsing, drying)?: A Lot Help from another person to put on and taking off regular upper body clothing?: A Lot Help from another person to put on and taking off regular lower body clothing?: A Lot 6 Click Score: 13   End of Session Equipment Utilized During Treatment: Gait belt;Rolling walker (2 wheels) Nurse Communication: Mobility status  Activity Tolerance: Patient tolerated treatment well Patient left: in bed;with call bell/phone within reach  OT Visit Diagnosis: Unsteadiness on feet (R26.81);Other abnormalities of gait and mobility (R26.89);Muscle weakness (generalized) (M62.81);Other symptoms and signs involving cognitive function;Pain Pain - part of body:  (generalized discomfort with moving "stiff")                Time: 2694-8546 OT Time Calculation (min): 29 min Charges:  OT General Charges $OT Visit:  1 Visit OT Evaluation $OT Eval Moderate Complexity: 1 Mod OT Treatments $Self Care/Home Management : 8-22 mins  Maurie Boettcher, OT/L   Acute OT Clinical Specialist Acute Rehabilitation Services Pager [phone] Office [phone]   [facility] 02/13/2021, 8:54 AM

## 2021-02-14 DIAGNOSIS — J189 Pneumonia, unspecified organism: Secondary | ICD-10-CM | POA: Diagnosis not present

## 2021-02-14 LAB — BASIC METABOLIC PANEL
Anion gap: 5 (ref 5–15)
BUN: 20 mg/dL (ref 8–23)
CO2: 26 mmol/L (ref 22–32)
Calcium: 9.6 mg/dL (ref 8.9–10.3)
Chloride: 110 mmol/L (ref 98–111)
Creatinine, Ser: 1.28 mg/dL — ABNORMAL HIGH (ref 0.61–1.24)
GFR, Estimated: 52 mL/min — ABNORMAL LOW (ref >60)
Glucose, Bld: 112 mg/dL — ABNORMAL HIGH (ref 70–99)
Potassium: 3.5 mmol/L (ref 3.5–5.1)
Sodium: 141 mmol/L (ref 135–145)

## 2021-02-14 LAB — CBC
HCT: 32.1 % — ABNORMAL LOW (ref 39.0–52.0)
Hemoglobin: 9.6 g/dL — ABNORMAL LOW (ref 13.0–17.0)
MCH: 27.7 pg (ref 26.0–34.0)
MCHC: 29.9 g/dL — ABNORMAL LOW (ref 30.0–36.0)
MCV: 92.8 fL (ref 80.0–100.0)
Platelets: 152 10*3/uL (ref 150–400)
RBC: 3.46 MIL/uL — ABNORMAL LOW (ref 4.22–5.81)
RDW: 15.1 % (ref 11.5–15.5)
WBC: 7.1 10*3/uL (ref 4.0–10.5)
nRBC: 0 % (ref 0.0–0.2)

## 2021-02-14 MED ORDER — MUPIROCIN 2 % EX OINT
TOPICAL_OINTMENT | Freq: Two times a day (BID) | CUTANEOUS | Status: DC
  Filled 2021-02-14: qty 22

## 2021-02-14 NOTE — Progress Notes (Addendum)
HD#1 SUBJECTIVE:  Patient Summary: [name] is a 87 y.o. with pertinent PMH of CVA, frequent falls, hyperlipidemia, hypertension who presented with altered mental status and admitted for acute encephalopathy on hospital day 0  Overnight Events: no acute events overnight.     Interm History: patient seen and evalluated at bedside. No complaints. Denies pain. Appears comfortable, responds to questioning and cooperative with exam.   OBJECTIVE:  Vital Signs: Vitals:   02/14/21 0400 02/14/21 0757 02/14/21 1221 02/14/21 1703  BP: 132/77 134/86 (!) 124/93 (!) 144/85  Pulse: 74 68 76 77  Resp: 18 20 18 20   Temp: 97.6 F (36.4 C) 98 F (36.7 C) 98.1 F (36.7 C) 98.1 F (36.7 C)  TempSrc: Axillary Oral Oral Oral  SpO2: 96% 93% 97% 91%  Weight:      Height:       Supplemental O2: Nasal Cannula SpO2: 91 % O2 Flow Rate (L/min): 2 L/min  Filed Weights   02/13/21 1515  Weight: 86.4 kg     Intake/Output Summary (Last 24 hours) at 02/14/2021 1901 Last data filed at 02/14/2021 1704 Gross per 24 hour  Intake 405.69 ml  Output 1450 ml  Net -1044.31 ml   Net IO Since Admission: 403.09 mL [02/14/21 1901]  Physical Exam: Physical Exam Constitutional:      General: He is not in acute distress.    Appearance: He is obese. He is not ill-appearing, toxic-appearing or diaphoretic.  HENT:     Head: Atraumatic.  Eyes:     Extraocular Movements: Extraocular movements intact.     Pupils: Pupils are equal, round, and reactive to light.  Cardiovascular:     Rate and Rhythm: Normal rate and regular rhythm.  Pulmonary:     Effort: Pulmonary effort is normal.     Breath sounds: Normal breath sounds.  Abdominal:     General: Abdomen is flat.     Palpations: Abdomen is soft.  Musculoskeletal:        General: No swelling, tenderness, deformity or signs of injury.     Cervical back: Normal range of motion.  Skin:    General: Skin is warm and dry.     Findings: Rash present.      Comments: Bilateral hypopigmented non scaling patches bilateral lower extremities, bilateral erythematous maculopapular rash bilateral lower extremities worse around ankles. Xerosis. Loos skin left heel indicative of pressure wound. Onychomycosis.   Neurological:     Mental Status: He is alert. Mental status is at baseline.     Sensory: No sensory deficit.  Psychiatric:        Behavior: Behavior normal.    Patient Lines/Drains/Airways Status     Active Line/Drains/Airways     Name Placement date Placement time Site Days   Peripheral IV 02/12/21 20 G Posterior;Right Hand 02/12/21  0000  Hand  1   Peripheral IV 02/12/21 22 G Left;Posterior Hand 02/12/21  0000  Hand  1   External Urinary Catheter 02/12/21  1156  --  1            Pertinent Labs: CBC Latest Ref Rng & Units 02/14/2021 02/13/2021 02/12/2021  WBC 4.0 - 10.5 K/uL 7.1 10.1 -  Hemoglobin 13.0 - 17.0 g/dL 9.6(L) 10.3(L) 11.6(L)  Hematocrit 39.0 - 52.0 % 32.1(L) 34.2(L) 34.0(L)  Platelets 150 - 400 K/uL 152 159 -    CMP Latest Ref Rng & Units 02/14/2021 02/13/2021 02/12/2021  Glucose 70 - 99 mg/dL 112(H) 108(H) 100(H)  BUN  8 - 23 mg/dL 20 21 20   Creatinine 0.61 - 1.24 mg/dL 1.28(H) 1.58(H) 1.40(H)  Sodium 135 - 145 mmol/L 141 143 145  Potassium 3.5 - 5.1 mmol/L 3.5 3.9 4.0  Chloride 98 - 111 mmol/L 110 112(H) 111  CO2 22 - 32 mmol/L 26 25 -  Calcium 8.9 - 10.3 mg/dL 9.6 9.5 -  Total Protein 6.5 - 8.1 g/dL - - -  Total Bilirubin 0.3 - 1.2 mg/dL - - -  Alkaline Phos 38 - 126 U/L - - -  AST 15 - 41 U/L - - -  ALT 0 - 44 U/L - - -    No results for input(s): GLUCAP in the last 72 hours.   Pertinent Imaging: No results found.  ASSESSMENT/PLAN:  Assessment: Principal Problem:   CAP (community acquired pneumonia) Active Problems:   BPH (benign prostatic hyperplasia)   CKD (chronic kidney disease) stage 3, GFR 30-59 ml/min (HCC)   Vascular dementia ([place])   UTI (urinary tract infection)   [name] is a 87  y.o. with pertinent PMH of CVA with reight sided residual weakness, HLD, HTN who presented with increased vocalization and posturing changes and admit for acute encephalopathy on hospital day 1  Plan: Acute encephalopathy Appears to be resolving, if not resolved. Unsure how close to baseline he is.  Current differentials include infectious versus new CVA Patient has multiple potential infectious etiologies to explain acute encephalopathy including pneumonia and UTI.  Chest x-ray showing possible atypical pneumonia, and patient has reported cough.  Furthermore he was recently evaluated in the clinic for presumed UTI with initial UA showing many bacteria. No concern for sepsis at this time.  Lactic acid within normal limits and vital signs have been stable. No metabolic causes identified on CMP - Blood cultures were obtained, will need to follow-up - PT/OT evaluated and rec SNF placement. Daughter prefers he be sent to [facility] or [facility] for placement after dc as he has been there in the past. [facility] accepted pt and insurance auth began    #Atypical pneumonia Resolved and no elevation in WBC or fever. No crackles or rales on exam. - Respiratory panel negative for influenza and COVID He has been started on azithromycin for atypical pneumonia. - can likely DC abx tomorrow - Continue supplemental oxygen and wean as tolerated  Urinary tract infection Patient has been intermittent bladder and incontinence for the past month roughly.  He was denying any pain or burning with urination.  And was seen in the clinic 2 days ago for presumed urinary tract infection. UA obtained 12/1 showing many bacteria present and he was started on Bactrim at that time Urinalysis obtained in the ED showing moderate leukocytes with rare bacteria present.   -Urine culture has been collected and will need to be followed up. - can likely DC abx tomorrow   #Acute kidney injury on chronic kidney disease Renal function  continues to worsen, started on continuous IV fluid at a rate of 125 mils per hour for 10 hours. Patient has been able to pass urine, bladder scan done with 60 mL residual. We will continue to monitor renal function with fluid administration, if no improvement can consider renal ultrasound.   #Chronic problems Hyperlipidemia, hypertension Will continue these medications for now  Best Practice: VTE: heparin injection 5,000 Units Start: 02/12/21 2200 Code: DNR   Signature: Delene Ruffini, MD  Internal Medicine Resident, PGY-1 [facility]  Pager: [phone] 7:01 PM, 02/14/2021  Please contact the on call pager after 5 pm and on weekends at [phone].

## 2021-02-14 NOTE — NC FL2 (Signed)
[place] LEVEL OF CARE SCREENING TOOL     IDENTIFICATION  Patient Name: [name] Birthdate: Jul 12, 1924 Sex: male Admission Date (Current Location): 02/12/2021  [facility] and [place] Number:  Herbalist and Address:  [facility] [place], [place] [address], [place], [place] [address]      Provider Number: [id number]  Attending Physician Name and Address:  Axel Filler, *  Relative Name and Phone Number:  [name] [phone]    Current Level of Care: Hospital Recommended Level of Care: [place] Prior Approval Number:    Date Approved/Denied:   PASRR Number: [id number]  Discharge Plan: SNF    Current Diagnoses: Patient Active Problem List   Diagnosis Date Noted   CAP (community acquired pneumonia) 02/12/2021   UTI (urinary tract infection) 02/10/2021   Nocturia 01/14/2021   Diverticulosis of colon 07/19/2020   Thrombocytopenia ([place]) 03/30/2020   Partial symptomatic epilepsy with complex partial seizures, not intractable, without status epilepticus ([place]) 04/17/2019   Chronic diastolic (congestive) heart failure ([place]) 10/28/2017   Cerebellar ataxia in diseases classified elsewhere ([place]) 02/13/2017   Vascular dementia ([place]) 01/28/2016   Presence of IVC filter 07/17/2014   CKD (chronic kidney disease) stage 3, GFR 30-59 ml/min (HCC) 06/23/2013   Hyperlipidemia    BPH (benign prostatic hyperplasia) 04/24/2013   Hearing loss 07/20/2011   Meningioma ([place]) 07/20/2011   HTN (hypertension) 07/19/2011    Orientation RESPIRATION BLADDER Height & Weight     Self, Place  O2 (2L Nasal cannula) Incontinent, External catheter Weight: 190 lb 7.6 oz (86.4 kg) Height:  5\' 9"  (175.3 cm)  BEHAVIORAL SYMPTOMS/MOOD NEUROLOGICAL BOWEL NUTRITION STATUS      Continent Diet (See discharge summary for diet)  AMBULATORY STATUS COMMUNICATION OF NEEDS Skin   Limited Assist Verbally Normal                        Personal Care Assistance Level of Assistance  Bathing, Feeding, Dressing Bathing Assistance: Maximum assistance Feeding assistance: Limited assistance Dressing Assistance: Limited assistance Total Care Assistance: Maximum assistance   Functional Limitations Info  Sight, Hearing Sight Info: Impaired Hearing Info: Impaired      SPECIAL CARE FACTORS FREQUENCY  PT (By licensed PT), OT (By licensed OT)     PT Frequency: 5 x per week OT Frequency: 5 x per week            Contractures Contractures Info: Not present    Additional Factors Info  Code Status, Allergies Code Status Info: DNR Allergies Info: NKA           Current Medications (02/14/2021):  This is the current hospital active medication list Current Facility-Administered Medications  Medication Dose Route Frequency Provider Last Rate Last Admin   acetaminophen (TYLENOL) tablet 650 mg  650 mg Oral Q6H PRN Iona Beard, MD       Or   acetaminophen (TYLENOL) suppository 650 mg  650 mg Rectal Q6H PRN Iona Beard, MD       aspirin EC tablet 81 mg  81 mg Oral Daily Iona Beard, MD   81 mg at 02/13/21 0811   atorvastatin (LIPITOR) tablet 20 mg  20 mg Oral Daily Iona Beard, MD   20 mg at 02/13/21 0812   azithromycin (ZITHROMAX) 500 mg in sodium chloride 0.9 % 250 mL IVPB  500 mg Intravenous Q24H Iona Beard, MD 250 mL/hr at 02/13/21 2150 500 mg at 02/13/21 2150  cefTRIAXone (ROCEPHIN) 1 g in sodium chloride 0.9 % 100 mL IVPB  1 g Intravenous Q24H Iona Beard, MD 200 mL/hr at 02/13/21 2317 1 g at 02/13/21 2317   heparin injection 5,000 Units  5,000 Units Subcutaneous Q8H Iona Beard, MD   5,000 Units at 02/14/21 4753     Discharge Medications: Please see discharge summary for a list of discharge medications.  Relevant Imaging Results:  Relevant Lab Results:   Additional Information SSN [id number]. [place] COVID-19 Vaccine 06/10/2019 , 05/11/2019  [name], LCSW

## 2021-02-14 NOTE — TOC Progression Note (Signed)
Transition of Care [facility]) - Progression Note    Patient Details  Name: [name] MRN: [id number] Date of Birth: Aug 06, 1924  Transition of Care [facility]) CM/SW [place], LCSW Phone Number: 02/14/2021, 2:03 PM  Clinical Narrative:    1:50pm-CSW received consult for possible SNF placement at time of discharge. CSW spoke with patient's daughter [name]. She reported that patient's family is currently unable to care for patient at their home given patient's current physical needs and fall risk. She expressed understanding of PT recommendation and is agreeable to SNF placement at time of discharge. She reports preference for 1-[facility] 2-[facility]. CSW discussed insurance authorization process and will provide Medicare SNF ratings list. Patient has received COVID vaccines.   2pm-[facility] able to accept patient. CSW requested they begin insurance authorization process as plan is not managed by [employer].    Skilled Nursing Rehab Facilities-   [email or web address]   Ratings out of 5 possible   Name Address  Phone # [place] Inspection Overall  [facility] [address], [place] 5 5 2 4   [facility]  [address] [place], [place] [phone] 4 2 5 5   [facility] [place], [place] 4 1 1 1   [place], [place] 2 2 4 4   [facility] [address], [place] 2 1 1 1   [place] 3 1 4 3   [facility] [address], [place] 5 2 2 3   [facility] [address] [place] 4 1 2 1   [place] [facility] [place] 5 1 2 2   [facility] [phone], [place] [phone] 4 1 1 1   [facility] [address], [facility] [phone] 4 1 2 1   [place] [phone] 4 1 1 1        Expected Discharge Plan: [place] Barriers to Discharge: Continued Medical Work up  Expected Discharge Plan and Services Expected Discharge Plan: [place] In-house Referral: Clinical Social Work     Living arrangements for the past 2 months: Single Family Home                                       Social Determinants of Health (SDOH) Interventions    Readmission Risk Interventions No flowsheet data found.

## 2021-02-15 DIAGNOSIS — J189 Pneumonia, unspecified organism: Secondary | ICD-10-CM | POA: Diagnosis not present

## 2021-02-15 LAB — CBC
HCT: 32.6 % — ABNORMAL LOW (ref 39.0–52.0)
Hemoglobin: 9.8 g/dL — ABNORMAL LOW (ref 13.0–17.0)
MCH: 27.8 pg (ref 26.0–34.0)
MCHC: 30.1 g/dL (ref 30.0–36.0)
MCV: 92.6 fL (ref 80.0–100.0)
Platelets: 162 10*3/uL (ref 150–400)
RBC: 3.52 MIL/uL — ABNORMAL LOW (ref 4.22–5.81)
RDW: 15 % (ref 11.5–15.5)
WBC: 6 10*3/uL (ref 4.0–10.5)
nRBC: 0 % (ref 0.0–0.2)

## 2021-02-15 LAB — RESP PANEL BY RT-PCR (FLU A&B, COVID) ARPGX2
Influenza A by PCR: NEGATIVE
Influenza B by PCR: NEGATIVE
SARS Coronavirus 2 by RT PCR: NEGATIVE

## 2021-02-15 LAB — BASIC METABOLIC PANEL
Anion gap: 7 (ref 5–15)
BUN: 17 mg/dL (ref 8–23)
CO2: 25 mmol/L (ref 22–32)
Calcium: 9.7 mg/dL (ref 8.9–10.3)
Chloride: 107 mmol/L (ref 98–111)
Creatinine, Ser: 1.27 mg/dL — ABNORMAL HIGH (ref 0.61–1.24)
GFR, Estimated: 52 mL/min — ABNORMAL LOW (ref >60)
Glucose, Bld: 100 mg/dL — ABNORMAL HIGH (ref 70–99)
Potassium: 3.6 mmol/L (ref 3.5–5.1)
Sodium: 139 mmol/L (ref 135–145)

## 2021-02-15 LAB — URINE CULTURE

## 2021-02-15 NOTE — Progress Notes (Signed)
HD#2 SUBJECTIVE:  Patient Summary: [name] is a 87 y.o. with pertinent PMH of CVA, frequent falls, hyperlipidemia, hypertension who presented with altered mental status and admitted for acute encephalopathy on hospital day 0  Overnight Events: no acute events overnight.     Interm History: patient seen and evalluated at bedside. No complaints. Denies pain. Appears comfortable, responds to questioning and cooperative with exam.   OBJECTIVE:  Vital Signs: Vitals:   02/15/21 0318 02/15/21 0400 02/15/21 0750 02/15/21 1155  BP: 140/76 136/84 (!) 148/80 (!) 148/84  Pulse: 72 74 72 78  Resp: 18 18 17 18   Temp:  98.4 F (36.9 C) 98.4 F (36.9 C) 98.3 F (36.8 C)  TempSrc:  Oral Axillary Axillary  SpO2: 91% 93% 94% 94%  Weight:      Height:       Supplemental O2: Nasal Cannula SpO2: 94 % O2 Flow Rate (L/min): 1 L/min  Filed Weights   02/13/21 1515  Weight: 86.4 kg     Intake/Output Summary (Last 24 hours) at 02/15/2021 1609 Last data filed at 02/15/2021 1323 Gross per 24 hour  Intake 540 ml  Output 1450 ml  Net -910 ml   Net IO Since Admission: -56.91 mL [02/15/21 1609]  Physical Exam: Physical Exam Constitutional:      General: He is not in acute distress.    Appearance: He is obese. He is not ill-appearing, toxic-appearing or diaphoretic.  HENT:     Head: Atraumatic.  Eyes:     Extraocular Movements: Extraocular movements intact.     Pupils: Pupils are equal, round, and reactive to light.  Cardiovascular:     Rate and Rhythm: Normal rate and regular rhythm.  Pulmonary:     Effort: Pulmonary effort is normal.     Breath sounds: Normal breath sounds.  Abdominal:     General: Abdomen is flat.     Palpations: Abdomen is soft.  Genitourinary:    Comments: Maceration on penis likely related to skin irritation from urine in condom cath Musculoskeletal:        General: No swelling, tenderness, deformity or signs of injury.     Cervical back: Normal range  of motion.  Skin:    General: Skin is warm and dry.     Findings: Rash present.     Comments: Bilateral hypopigmented non scaling patches bilateral lower extremities, bilateral erythematous maculopapular rash bilateral lower extremities worse around ankles. Xerosis. Loos skin left heel indicative of pressure wound. Onychomycosis.   Neurological:     Mental Status: He is alert. Mental status is at baseline.     Sensory: No sensory deficit.  Psychiatric:        Behavior: Behavior normal.    Patient Lines/Drains/Airways Status     Active Line/Drains/Airways     Name Placement date Placement time Site Days   Peripheral IV 02/12/21 20 G Posterior;Right Hand 02/12/21  0000  Hand  1   Peripheral IV 02/12/21 22 G Left;Posterior Hand 02/12/21  0000  Hand  1   External Urinary Catheter 02/12/21  1156  --  1            Pertinent Labs: CBC Latest Ref Rng & Units 02/15/2021 02/14/2021 02/13/2021  WBC 4.0 - 10.5 K/uL 6.0 7.1 10.1  Hemoglobin 13.0 - 17.0 g/dL 9.8(L) 9.6(L) 10.3(L)  Hematocrit 39.0 - 52.0 % 32.6(L) 32.1(L) 34.2(L)  Platelets 150 - 400 K/uL 162 152 159    CMP Latest Ref Rng &  Units 02/15/2021 02/14/2021 02/13/2021  Glucose 70 - 99 mg/dL 100(H) 112(H) 108(H)  BUN 8 - 23 mg/dL 17 20 21   Creatinine 0.61 - 1.24 mg/dL 1.27(H) 1.28(H) 1.58(H)  Sodium 135 - 145 mmol/L 139 141 143  Potassium 3.5 - 5.1 mmol/L 3.6 3.5 3.9  Chloride 98 - 111 mmol/L 107 110 112(H)  CO2 22 - 32 mmol/L 25 26 25   Calcium 8.9 - 10.3 mg/dL 9.7 9.6 9.5  Total Protein 6.5 - 8.1 g/dL - - -  Total Bilirubin 0.3 - 1.2 mg/dL - - -  Alkaline Phos 38 - 126 U/L - - -  AST 15 - 41 U/L - - -  ALT 0 - 44 U/L - - -    No results for input(s): GLUCAP in the last 72 hours.   Pertinent Imaging: No results found.  ASSESSMENT/PLAN:  Assessment: Principal Problem:   CAP (community acquired pneumonia) Active Problems:   BPH (benign prostatic hyperplasia)   CKD (chronic kidney disease) stage 3, GFR 30-59 ml/min  (HCC)   Vascular dementia ([place])   UTI (urinary tract infection)   [name] is a 87 y.o. with pertinent PMH of CVA with reight sided residual weakness, HLD, HTN who presented with increased vocalization and posturing changes and admit for acute encephalopathy on hospital day 2  Plan: Acute encephalopathy Resolved after treated for UTI and aspiration PNA.  - Blood cultures were obtained, NGTD - PT/OT evaluated and rec SNF placement. Daughter prefers he be sent to either [facility] or [facility] for placement after dc as he has been there in the past. [facility] accepted pt and insurance auth began    #Atypical pneumonia Resolved and no elevation in WBC or fever. No crackles or rales on exam. - Respiratory panel negative for influenza and COVID He has been started on azithromycin for atypical pneumonia. - completed course of zithromax - Continue supplemental oxygen and wean as tolerated  Urinary tract infection - resolved, no fever or leukocytosis. No urinary symptoms - Urine culture from admission polymicrobial, likely contamination  - completed a course of ceftriaxone   #Acute kidney injury on chronic kidney disease - improved, cr at baseline with improvement in GFR to 52   #Chronic problems Hyperlipidemia, hypertension Will continue these medications for now  Best Practice: VTE: heparin injection 5,000 Units Start: 02/12/21 2200 Code: DNR   Signature: Delene Ruffini, MD  Internal Medicine Resident, PGY-1 [facility]  Pager: [phone] 4:09 PM, 02/15/2021   Please contact the on call pager after 5 pm and on weekends at [phone].

## 2021-02-16 DIAGNOSIS — G9340 Encephalopathy, unspecified: Secondary | ICD-10-CM | POA: Diagnosis not present

## 2021-02-16 DIAGNOSIS — F918 Other conduct disorders: Secondary | ICD-10-CM | POA: Diagnosis not present

## 2021-02-16 DIAGNOSIS — F0150 Vascular dementia without behavioral disturbance: Secondary | ICD-10-CM | POA: Diagnosis not present

## 2021-02-16 DIAGNOSIS — I679 Cerebrovascular disease, unspecified: Secondary | ICD-10-CM | POA: Diagnosis not present

## 2021-02-16 DIAGNOSIS — I129 Hypertensive chronic kidney disease with stage 1 through stage 4 chronic kidney disease, or unspecified chronic kidney disease: Secondary | ICD-10-CM | POA: Diagnosis not present

## 2021-02-16 DIAGNOSIS — G9341 Metabolic encephalopathy: Secondary | ICD-10-CM | POA: Diagnosis not present

## 2021-02-16 DIAGNOSIS — M6281 Muscle weakness (generalized): Secondary | ICD-10-CM | POA: Diagnosis not present

## 2021-02-16 DIAGNOSIS — M6258 Muscle wasting and atrophy, not elsewhere classified, other site: Secondary | ICD-10-CM | POA: Diagnosis not present

## 2021-02-16 DIAGNOSIS — D649 Anemia, unspecified: Secondary | ICD-10-CM | POA: Diagnosis not present

## 2021-02-16 DIAGNOSIS — R1312 Dysphagia, oropharyngeal phase: Secondary | ICD-10-CM | POA: Diagnosis not present

## 2021-02-16 DIAGNOSIS — N183 Chronic kidney disease, stage 3 unspecified: Secondary | ICD-10-CM | POA: Diagnosis not present

## 2021-02-16 DIAGNOSIS — I10 Essential (primary) hypertension: Secondary | ICD-10-CM | POA: Diagnosis not present

## 2021-02-16 DIAGNOSIS — Z8673 Personal history of transient ischemic attack (TIA), and cerebral infarction without residual deficits: Secondary | ICD-10-CM | POA: Diagnosis not present

## 2021-02-16 DIAGNOSIS — R2681 Unsteadiness on feet: Secondary | ICD-10-CM | POA: Diagnosis not present

## 2021-02-16 DIAGNOSIS — R2689 Other abnormalities of gait and mobility: Secondary | ICD-10-CM | POA: Diagnosis not present

## 2021-02-16 DIAGNOSIS — R404 Transient alteration of awareness: Secondary | ICD-10-CM | POA: Diagnosis not present

## 2021-02-16 DIAGNOSIS — J189 Pneumonia, unspecified organism: Secondary | ICD-10-CM | POA: Diagnosis not present

## 2021-02-16 DIAGNOSIS — N390 Urinary tract infection, site not specified: Secondary | ICD-10-CM | POA: Diagnosis not present

## 2021-02-16 DIAGNOSIS — R410 Disorientation, unspecified: Secondary | ICD-10-CM | POA: Diagnosis not present

## 2021-02-16 DIAGNOSIS — N179 Acute kidney failure, unspecified: Secondary | ICD-10-CM | POA: Diagnosis not present

## 2021-02-16 DIAGNOSIS — R41841 Cognitive communication deficit: Secondary | ICD-10-CM | POA: Diagnosis not present

## 2021-02-16 DIAGNOSIS — I5032 Chronic diastolic (congestive) heart failure: Secondary | ICD-10-CM | POA: Diagnosis not present

## 2021-02-16 DIAGNOSIS — Z7401 Bed confinement status: Secondary | ICD-10-CM | POA: Diagnosis not present

## 2021-02-16 DIAGNOSIS — R262 Difficulty in walking, not elsewhere classified: Secondary | ICD-10-CM | POA: Diagnosis not present

## 2021-02-16 DIAGNOSIS — J9691 Respiratory failure, unspecified with hypoxia: Secondary | ICD-10-CM | POA: Diagnosis not present

## 2021-02-16 DIAGNOSIS — I110 Hypertensive heart disease with heart failure: Secondary | ICD-10-CM | POA: Diagnosis not present

## 2021-02-16 MED ORDER — CIPROFLOXACIN HCL 500 MG PO TABS: 500.0000 mg | ORAL_TABLET | Freq: Two times a day (BID) | ORAL | 0 refills | Status: AC

## 2021-02-16 NOTE — Discharge Instructions (Addendum)
Dear [name],  Thank you for trusting [place] with your care. You were brought to the hospital for altered mental status. We treated you for pneumonia as well as a urinary tract infection and your mental status improved. We treated these two infections with azithromycin for your pneumonia and ceftriaxone for your urinary tract infection. You are doing much better and do not need to continue these. We have noticed that your oxygen levels drop occasionally while you sleep at night. We will send some supplemental oxygen with you on discharge. We do recommend that go to a rehab facility after discharge from the hospital to help build up your strength. You have been accepted at [facility] for further assistance. We also recommend that you follow up with your primary care doctor in the coming weeks.

## 2021-02-16 NOTE — Progress Notes (Signed)
Occupational Therapy Treatment Patient Details Name: [name] MRN: [id number] DOB: 01-11-1925 Today's Date: 02/16/2021   History of present illness 47 admitted 12/3 with AMS and generalized weakness. Suspected pneumonia, PMH: CVA, CKD, Covid, HTN, Meningioma, Vitreous hemorrhage; frequent falls,   OT comments  Patient Co-treat with PT for patient safety due to posterior leaning during standing and transfers. Patient received in bed and required mod assist to get to EOB due to assistance needed for trunk and difficulty following directions. Patient assisted with gown change seated on EOB.  Standing performed from EOB with assist of 2 and patient demonstrating posterior leaning. Patient performed standing at sink with sink assisting with balance and patient able to perform hand and face hygiene. Patient appropriate for SNF discharge. Acute OT to continue to follow.    Recommendations for follow up therapy are one component of a multi-disciplinary discharge planning process, led by the attending physician.  Recommendations may be updated based on patient status, additional functional criteria and insurance authorization.    Follow Up Recommendations  Skilled nursing-short term rehab (<3 hours/day)    Assistance Recommended at Discharge Frequent or constant Supervision/Assistance  Equipment Recommendations  None recommended by OT    Recommendations for Other Services      Precautions / Restrictions Precautions Precautions: Fall Precaution Comments: weak R side at baseline, HOH with L ear better (per chart, PT notices no difference) Restrictions Weight Bearing Restrictions: No       Mobility Bed Mobility Overal bed mobility: Needs Assistance Bed Mobility: Supine to Sit     Supine to sit: Mod assist;HOB elevated     General bed mobility comments: increased time with difficulty following directions    Transfers Overall transfer level: Needs assistance Equipment used:  Rolling walker (2 wheels) Transfers: Sit to/from Stand;Bed to chair/wheelchair/BSC Sit to Stand: Mod assist;From elevated surface;+2 physical assistance Stand pivot transfers: Max assist;+2 physical assistance         General transfer comment: patient demonstrated posterior leaning when standing and during transfers with difficulty lowering self to seat     Balance Overall balance assessment: Needs assistance Sitting-balance support: No upper extremity supported;Feet supported Sitting balance-Leahy Scale: Poor Sitting balance - Comments: loss of balance posteriorly with patient able to correct with mod assist Postural control: Posterior lean Standing balance support: Bilateral upper extremity supported Standing balance-Leahy Scale: Poor Standing balance comment: modA, posterior lean with legs pressed against bed/chair behind. BUE support of walker or sink                           ADL either performed or assessed with clinical judgement   ADL Overall ADL's : Needs assistance/impaired     Grooming: Minimal assistance;Wash/dry face;Wash/dry hands;Standing Grooming Details (indicate cue type and reason): performed hand and face hygiene standing at sink with sink as support and mod assist for balance         Upper Body Dressing : Moderate assistance;Sitting Upper Body Dressing Details (indicate cue type and reason): doffed and donned new gown due to dirty while sitting on EOB                 Functional mobility during ADLs: Maximal assistance;+2 for physical assistance;Rolling walker (2 wheels) General ADL Comments: posterior leaning with walker and at sink    Extremity/Trunk Assessment Upper Extremity Assessment RUE Deficits / Details: using functionally            Vision  Perception     Praxis      Cognition Arousal/Alertness: Awake/alert Behavior During Therapy: WFL for tasks assessed/performed Overall Cognitive Status: Difficult to  assess                                 General Comments: hx of cognitive deficits; difficukt to assess due to [facility]. Pt appears to follow commands well when he hears them          Exercises     Shoulder Instructions       General Comments pt on 2L [place] upon arrival, by end of session SpO2 87-90% on 2L [place], PT increases supplemental O2 to 3L [place]    Pertinent Vitals/ Pain       Pain Assessment: No/denies pain Pain Score: 0-No pain Faces Pain Scale: No hurt  Home Living                                          Prior Functioning/Environment              Frequency  Min 2X/week        Progress Toward Goals  OT Goals(current goals can now be found in the care plan section)  Progress towards OT goals: Progressing toward goals  Acute Rehab OT Goals OT Goal Formulation: Patient unable to participate in goal setting Time For Goal Achievement: 02/27/21 Potential to Achieve Goals: Good ADL Goals Pt Will Perform Grooming: with supervision;standing Pt Will Perform Upper Body Bathing: with set-up Pt Will Perform Lower Body Bathing: with min guard assist;sit to/from stand Pt Will Perform Lower Body Dressing: with min assist;sit to/from stand Pt Will Transfer to Toilet: with min guard assist;ambulating Additional ADL Goal #1: Bed mobility with Min A in preparation for ADL tasks  Plan Discharge plan remains appropriate    Co-evaluation    PT/OT/SLP Co-Evaluation/Treatment: Yes Reason for Co-Treatment: For patient/therapist safety;To address functional/ADL transfers PT goals addressed during session: Mobility/safety with mobility;Balance;Proper use of DME;Strengthening/ROM OT goals addressed during session: ADL's and self-care      AM-PAC OT "6 Clicks" Daily Activity     Outcome Measure   Help from another person eating meals?: A Little Help from another person taking care of personal grooming?: A Little Help from another person toileting,  which includes using toliet, bedpan, or urinal?: Total Help from another person bathing (including washing, rinsing, drying)?: A Lot Help from another person to put on and taking off regular upper body clothing?: A Lot Help from another person to put on and taking off regular lower body clothing?: A Lot 6 Click Score: 13    End of Session Equipment Utilized During Treatment: Gait belt;Rolling walker (2 wheels);Oxygen  OT Visit Diagnosis: Unsteadiness on feet (R26.81);Other abnormalities of gait and mobility (R26.89);Muscle weakness (generalized) (M62.81);Other symptoms and signs involving cognitive function;Pain   Activity Tolerance Patient tolerated treatment well   Patient Left in chair;with call bell/phone within reach;with chair alarm set   Nurse Communication Need for lift equipment        Time: [id number] OT Time Calculation (min): 33 min  Charges: OT General Charges $OT Visit: 1 Visit OT Treatments $Self Care/Home Management : 8-22 mins  Lodema Hong, [place]  Pager [phone] Office [place] 02/16/2021, 11:44 AM

## 2021-02-16 NOTE — TOC Transition Note (Signed)
Transition of Care [facility]) - CM/SW Discharge Note   Patient Details  Name: ALYAS CREARY MRN: [id number] Date of Birth: Apr 04, 1924  Transition of Care [facility]) CM/SW Contact:  Geralynn Ochs, LCSW Phone Number: 02/16/2021, 11:54 AM   Clinical Narrative:   Nurse to call report to [phone].  Transport scheduled for 1:30 PM.    Final next level of care: [place] Barriers to Discharge: Barriers Resolved   Patient Goals and CMS Choice        Discharge Placement              Patient chooses bed at: [facility] Patient to be transferred to facility by: [place] Name of family member notified: [name] Patient and family notified of of transfer: 02/16/21  Discharge Plan and Services In-house Referral: Clinical Social Work                                   Social Determinants of Health ([place]) Interventions     Readmission Risk Interventions No flowsheet data found.

## 2021-02-16 NOTE — Discharge Summary (Addendum)
Name: [name] MRN: [id number] DOB: 05/10/1924 87 y.o. PCP: Jimmy Groves, DO  Date of Admission: 02/12/2021 11:30 AM Date of Discharge: 02/16/2021 Attending Physician: Dr. Evette Doffing  Discharge Diagnosis: Principal Problem:   CAP (community acquired pneumonia) Active Problems:   BPH (benign prostatic hyperplasia)   CKD (chronic kidney disease) stage 3, GFR 30-59 ml/min (HCC)   Vascular dementia ([place])   UTI (urinary tract infection)    Discharge Medications: Allergies as of 02/16/2021   No Known Allergies      Medication List     STOP taking these medications    sulfamethoxazole-trimethoprim 800-160 MG tablet Commonly known as: BACTRIM DS       TAKE these medications    amLODipine 5 MG tablet Commonly known as: NORVASC Take 1 tablet (5 mg total) by mouth 2 (two) times daily. What changed:  how much to take when to take this   aspirin 81 MG EC tablet Take 1 tablet (81 mg total) by mouth daily.   atorvastatin 20 MG tablet Commonly known as: LIPITOR Take 1 tablet (20 mg total) by mouth daily.   ciprofloxacin 500 MG tablet Commonly known as: Cipro Take 1 tablet (500 mg total) by mouth 2 (two) times daily for 3 days.   furosemide 40 MG tablet Commonly known as: LASIX Take 1 tablet (40 mg total) by mouth daily as needed for fluid.   triamcinolone ointment 0.1 % Commonly known as: KENALOG Apply 1 application topically 2 (two) times daily as needed (For leg rash).               Durable Medical Equipment  (From admission, onward)           Start     Ordered   02/16/21 1114  DME Oxygen  Once       Question Answer Comment  Length of Need 6 Months   Mode or (Route) Nasal cannula   Liters per Minute 2   Frequency Only at night (stationary unit needed)   Oxygen delivery system Gas      02/16/21 1114            Disposition and follow-up:   [name] was discharged from [facility] in Stable condition.  At  the hospital follow up visit please address:  1.  Follow-up:  a. Community acquired pneumonia    b. Urinary tract infection   c. Encephalopathy 2/2 infection   d. Acute kidney injury and chronic kidney disease  2.  Labs / imaging needed at time of follow-up: CBC, CMP  3.  Pending labs/ test needing follow-up: none  4.  Medication Changes  Started: none  Stopped: none  Changed: none  Abx -  Cipro 500 BID 3 days End Date: 02/18/21  Follow-up Appointments:  Contact information for after-discharge care     Destination     [facility] .   Service: Skilled Nursing Contact information: [facility] Course by problem list:  Patient presented tot the hospital with complaints of altered mental status. Per daughter he was vocalizing more than usual and posturing differently. During workup in the emergency department, he was noted to have bilateral opacities on CXR and new oxygen requirement. He was treated with a 5 day course of Zithromax with improvement in his mental status and  lung exam. He was noted to have occasional oxygen desaturation at night while sleeping which quickly resolved with 1-2 L supplemental O2. He will be discharged with supplemental oxygen to be used at night.   During initial evaluation in the ED patient was also noted to have bacteria and leukocytes in his urine, which he had been evaluated in the outpatient clinic for two days prior to admission but had not completed treatment. He was started on ceftriaxone for UTI and completed a 5 day course. Urinary symptoms resolved with no future complaints. Urine culture obtained from clinic prior to admission positive for pseudomonas. He will be given a three day course of Cipro 500 BID.   Acute kidney injury on chronic kidney disease was noted on initial evaluation. Initial concern for retention, but bladder scan was  performed and showed no findings concerning for retention. AKI felt to be related to recent poor oral intake in the setting of acute illness. He was started on IVFs and renal function improved.      Discharge Subjective: Patient seen and evaluated at bedside. He reports that he is doing well this morning. No complaints. Called daughter and updated her on discharge planning. He will be going to [place].   Discharge Exam:   BP 121/68 (BP Location: Left Arm)   Pulse 75   Temp 97.8 F (36.6 C) (Oral)   Resp 17   Ht 5\' 9"  (1.753 m)   Wt 86.4 kg   SpO2 94%   BMI 28.13 kg/m  Constitutional:      General: Elderly male. Comfortable. He is not in acute distress.     Appearance: He is obese. He is not ill-appearing, toxic-appearing or diaphoretic.  HENT:     Head: Atraumatic.  Eyes:     Extraocular Movements: Extraocular movements intact.     Pupils: Pupils are equal, round, and reactive to light.  Cardiovascular:     Rate and Rhythm: Normal rate and regular rhythm.  Pulmonary:     Effort: Pulmonary effort is normal.     Breath sounds: Normal breath sounds.  Abdominal:     General: Abdomen is flat.     Palpations: Abdomen is soft.  Genitourinary:    Comments: Maceration on penis likely related to skin irritation from urine in condom cath Musculoskeletal:        General: No swelling, tenderness, deformity or signs of injury.     Cervical back: Normal range of motion.  Skin:    General: Skin is warm and dry.     Findings: Rash present.     Comments: Bilateral hypopigmented non scaling patches bilateral lower extremities, bilateral erythematous maculopapular rash bilateral lower extremities worse around ankles. Xerosis. Loos skin left heel indicative of pressure wound. Onychomycosis.   Neurological:     Mental Status: He is alert. Mental status is at baseline.     Sensory: No sensory deficit.  Psychiatric:        Behavior: Behavior normal.  Pertinent Labs, Studies, and Procedures:   CBC Latest Ref Rng & Units 02/15/2021 02/14/2021 02/13/2021  WBC 4.0 - 10.5 K/uL 6.0 7.1 10.1  Hemoglobin 13.0 - 17.0 g/dL 9.8(L) 9.6(L) 10.3(L)  Hematocrit 39.0 - 52.0 % 32.6(L) 32.1(L) 34.2(L)  Platelets 150 - 400 K/uL 162 152 159    CMP Latest Ref Rng & Units 02/15/2021 02/14/2021 02/13/2021  Glucose 70 - 99 mg/dL 100(H) 112(H) 108(H)  BUN 8 - 23 mg/dL 17 20 21   Creatinine 0.61 - 1.24 mg/dL 1.27(H)  1.28(H) 1.58(H)  Sodium 135 - 145 mmol/L 139 141 143  Potassium 3.5 - 5.1 mmol/L 3.6 3.5 3.9  Chloride 98 - 111 mmol/L 107 110 112(H)  CO2 22 - 32 mmol/L 25 26 25   Calcium 8.9 - 10.3 mg/dL 9.7 9.6 9.5  Total Protein 6.5 - 8.1 g/dL - - -  Total Bilirubin 0.3 - 1.2 mg/dL - - -  Alkaline Phos 38 - 126 U/L - - -  AST 15 - 41 U/L - - -  ALT 0 - 44 U/L - - -    DG Chest Port 1 View  Result Date: 02/12/2021 CLINICAL DATA:  Questionable sepsis. EXAM: PORTABLE CHEST 1 VIEW COMPARISON:  October 14, 2020 FINDINGS: Cardiomediastinal silhouette is stable. Incomplete inspiration. Low lung volumes. Increased interstitial markings with scattered interstitial and airspace opacities throughout both lungs, right greater than left. Osseous structures are without acute abnormality. Soft tissues are grossly normal. IMPRESSION: Increased interstitial markings with scattered interstitial and airspace opacities throughout both lungs, right greater than left. Findings may represent mixed pattern pulmonary edema or atypical pneumonia. Electronically Signed   By: Fidela Salisbury M.D.   On: 02/12/2021 12:45   CT HEAD CODE STROKE WO CONTRAST  Result Date: 02/12/2021 CLINICAL DATA:  Code stroke.  Weakness and fever EXAM: CT HEAD WITHOUT CONTRAST TECHNIQUE: Contiguous axial images were obtained from the base of the skull through the vertex without intravenous contrast. COMPARISON:  None. FINDINGS: Brain: 2.6 x 2.0 cm anterior cranial fossa meningioma is unchanged. There is unchanged widespread periventricular white matter  hypodensity and an old right occipital infarct. There is generalized atrophy without lobar predilection. No acute hemorrhage. Advanced right cerebellar encephalomalacia. Vascular: No abnormal hyperdensity of the major intracranial arteries or dural venous sinuses. No intracranial atherosclerosis. Skull: The visualized skull base, calvarium and extracranial soft tissues are normal. Sinuses/Orbits: No fluid levels or advanced mucosal thickening of the visualized paranasal sinuses. No mastoid or middle ear effusion. The orbits are normal. ASPECTS [facility] Stroke Program Early CT Score) - Ganglionic level infarction (caudate, lentiform nuclei, internal capsule, insula, M1-M3 cortex): 7 - Supraganglionic infarction (M4-M6 cortex): 3 Total score (0-10 with 10 being normal): 10 IMPRESSION: 1. No acute intracranial abnormality. 2. ASPECTS is 10. 3. Unchanged anterior cranial fossa meningioma and chronic ischemic microangiopathy. Electronically Signed   By: Ulyses Jarred M.D.   On: 02/12/2021 19:59     Discharge Instructions: Discharge Instructions     Call MD for:  difficulty breathing, headache or visual disturbances   Complete by: As directed    Call MD for:  extreme fatigue   Complete by: As directed    Call MD for:  persistant dizziness or light-headedness   Complete by: As directed    Call MD for:  persistant nausea and vomiting   Complete by: As directed    Call MD for:  temperature >100.4   Complete by: As directed    Diet - low sodium heart healthy   Complete by: As directed    Increase activity slowly   Complete by: As directed      Thank you for trusting [place] with your care. You were brought to the hospital for altered mental status. We treated you for pneumonia as well as a urinary tract infection and your mental status improved. We treated these two infections with azithromycin for your pneumonia and ceftriaxone for your urinary tract infection. You are doing much better and do not need to  continue these. We have noticed that  your oxygen levels drop occasionally while you sleep at night. We will send some supplemental oxygen with you on discharge. We do recommend that go to a rehab facility after discharge from the hospital to help build up your strength. You have been accepted at [facility] for further assistance. We also recommend that you follow up with your primary care doctor in the coming weeks.   Signed: Delene Ruffini, MD 02/16/2021, 3:10 PM   Pager: [phone]

## 2021-02-16 NOTE — Progress Notes (Signed)
Report given to [facility].

## 2021-02-16 NOTE — Progress Notes (Signed)
Daughter was informed that [facility]/ transport is here.

## 2021-02-16 NOTE — Progress Notes (Signed)
Physical Therapy Treatment Patient Details Name: [name] MRN: [id number] DOB: 1924-10-03 Today's Date: 02/16/2021   History of Present Illness 79 admitted 12/3 with AMS and generalized weakness. Suspected pneumonia, PMH: CVA, CKD, Covid, HTN, Meningioma, Vitreous hemorrhage; frequent falls,    PT Comments    Pt tolerates treatment well but continues to demonstrate a significant posterior lean with all standing attempts. Pt leaning against bed, unable to lean forward without physical assist from PT. Pt remains at a high risk for falls due to posterior lean. PT recommends use of STEDY and +2 assist for transfers from nursing staff to improve safety. PT continues to recommend SNF placement.   Recommendations for follow up therapy are one component of a multi-disciplinary discharge planning process, led by the attending physician.  Recommendations may be updated based on patient status, additional functional criteria and insurance authorization.  Follow Up Recommendations  Skilled nursing-short term rehab (<3 hours/day)     Assistance Recommended at Discharge Frequent or constant Supervision/Assistance  Equipment Recommendations  Rolling walker (2 wheels);BSC/3in1;Wheelchair (measurements PT);Wheelchair cushion (measurements PT)    Recommendations for Other Services       Precautions / Restrictions Precautions Precautions: Fall Precaution Comments: weak R side at baseline, HOH with L ear better (per chart, PT notices no difference) Restrictions Weight Bearing Restrictions: No     Mobility  Bed Mobility Overal bed mobility: Needs Assistance Bed Mobility: Supine to Sit     Supine to sit: Mod assist;HOB elevated     General bed mobility comments: increased time, assist to scoot hips to edge of bed, UE support to pull up into sitting    Transfers Overall transfer level: Needs assistance Equipment used: Rolling walker (2 wheels) Transfers: Sit to/from Stand;Bed to  chair/wheelchair/BSC Sit to Stand: Mod assist;From elevated surface;+2 physical assistance Stand pivot transfers: Max assist;+2 physical assistance         General transfer comment: pt with posterior lean in standing, legs pressing against recliner. Unable to assist pt out of posterior lean despite physical assistance, verbal and tactile cues    Ambulation/Gait             Pre-gait activities: pt takes one step with each foot, minimal toe clearance noted with heel remaining on floor     Stairs             Wheelchair Mobility    Modified Rankin (Stroke Patients Only)       Balance Overall balance assessment: Needs assistance Sitting-balance support: No upper extremity supported;Feet supported Sitting balance-Leahy Scale: Poor Sitting balance - Comments: posterior loss of balance with slow slide forward to edge of bed Postural control: Posterior lean Standing balance support: Bilateral upper extremity supported Standing balance-Leahy Scale: Poor Standing balance comment: modA, posterior lean with legs pressed against bed/chair behind. BUE support of walker or sink                            Cognition Arousal/Alertness: Awake/alert Behavior During Therapy: WFL for tasks assessed/performed Overall Cognitive Status: Difficult to assess                                 General Comments: hx of cognitive deficits; difficukt to assess due to [facility]. Pt appears to follow commands well when he hears them        Exercises      General Comments General  comments (skin integrity, edema, etc.): pt on 2L [place] upon arrival, by end of session SpO2 87-90% on 2L [place], PT increases supplemental O2 to 3L [place]      Pertinent Vitals/Pain Pain Assessment: No/denies pain Pain Score: 0-No pain Faces Pain Scale: No hurt    Home Living                          Prior Function            PT Goals (current goals can now be found in the care plan  section) Acute Rehab PT Goals Patient Stated Goal: go to rehab at SNF (per OT, family requesting rehab) Progress towards PT goals: Not progressing toward goals - comment (remains limited by posterior lean)    Frequency    Min 2X/week      PT Plan Current plan remains appropriate    Co-evaluation PT/OT/SLP Co-Evaluation/Treatment: Yes Reason for Co-Treatment: For patient/therapist safety;To address functional/ADL transfers PT goals addressed during session: Mobility/safety with mobility;Balance;Proper use of DME;Strengthening/ROM        AM-PAC PT "6 Clicks" Mobility   Outcome Measure  Help needed turning from your back to your side while in a flat bed without using bedrails?: A Little Help needed moving from lying on your back to sitting on the side of a flat bed without using bedrails?: A Lot Help needed moving to and from a bed to a chair (including a wheelchair)?: Total Help needed standing up from a chair using your arms (e.g., wheelchair or bedside chair)?: Total Help needed to walk in hospital room?: Total Help needed climbing 3-5 steps with a railing? : Total 6 Click Score: 9    End of Session   Activity Tolerance: Patient tolerated treatment well Patient left: in chair;with call bell/phone within reach;with chair alarm set Nurse Communication: Mobility status PT Visit Diagnosis: Unsteadiness on feet (R26.81);Difficulty in walking, not elsewhere classified (R26.2);Muscle weakness (generalized) (M62.81);Other abnormalities of gait and mobility (R26.89)     Time: 1505-6979 PT Time Calculation (min) (ACUTE ONLY): 33 min  Charges:  $Therapeutic Activity: 8-22 mins                     Zenaida Niece, PT, DPT Acute Rehabilitation Pager: [phone] Office [place] Nickisha Hum 02/16/2021, 10:29 AM

## 2021-02-16 NOTE — Care Management Important Message (Signed)
Important Message  Patient Details  Name: [name] MRN: [id number] Date of Birth: April 03, 1924   Medicare Important Message Given:  Yes     Madisin Hasan 02/16/2021, 2:04 PM

## 2021-02-16 NOTE — Progress Notes (Signed)
Pt's POA Daughter [name] notified regarding d/c order to SNF.

## 2021-02-17 DIAGNOSIS — I110 Hypertensive heart disease with heart failure: Secondary | ICD-10-CM | POA: Diagnosis not present

## 2021-02-17 DIAGNOSIS — N390 Urinary tract infection, site not specified: Secondary | ICD-10-CM | POA: Diagnosis not present

## 2021-02-17 DIAGNOSIS — N179 Acute kidney failure, unspecified: Secondary | ICD-10-CM | POA: Diagnosis not present

## 2021-02-17 DIAGNOSIS — G9340 Encephalopathy, unspecified: Secondary | ICD-10-CM | POA: Diagnosis not present

## 2021-02-17 DIAGNOSIS — J189 Pneumonia, unspecified organism: Secondary | ICD-10-CM | POA: Diagnosis not present

## 2021-02-17 DIAGNOSIS — N183 Chronic kidney disease, stage 3 unspecified: Secondary | ICD-10-CM | POA: Diagnosis not present

## 2021-02-17 DIAGNOSIS — D649 Anemia, unspecified: Secondary | ICD-10-CM | POA: Diagnosis not present

## 2021-02-17 DIAGNOSIS — I679 Cerebrovascular disease, unspecified: Secondary | ICD-10-CM | POA: Diagnosis not present

## 2021-02-17 DIAGNOSIS — J9691 Respiratory failure, unspecified with hypoxia: Secondary | ICD-10-CM | POA: Diagnosis not present

## 2021-02-17 LAB — CULTURE, BLOOD (ROUTINE X 2)
Culture: NO GROWTH
Culture: NO GROWTH

## 2021-02-21 DIAGNOSIS — N390 Urinary tract infection, site not specified: Secondary | ICD-10-CM | POA: Diagnosis not present

## 2021-02-21 DIAGNOSIS — I5032 Chronic diastolic (congestive) heart failure: Secondary | ICD-10-CM | POA: Diagnosis not present

## 2021-02-21 DIAGNOSIS — J9691 Respiratory failure, unspecified with hypoxia: Secondary | ICD-10-CM | POA: Diagnosis not present

## 2021-02-21 DIAGNOSIS — I10 Essential (primary) hypertension: Secondary | ICD-10-CM | POA: Diagnosis not present

## 2021-02-21 DIAGNOSIS — J189 Pneumonia, unspecified organism: Secondary | ICD-10-CM | POA: Diagnosis not present

## 2021-03-04 DIAGNOSIS — I5032 Chronic diastolic (congestive) heart failure: Secondary | ICD-10-CM | POA: Diagnosis not present

## 2021-03-04 DIAGNOSIS — I129 Hypertensive chronic kidney disease with stage 1 through stage 4 chronic kidney disease, or unspecified chronic kidney disease: Secondary | ICD-10-CM | POA: Diagnosis not present

## 2021-03-04 DIAGNOSIS — D649 Anemia, unspecified: Secondary | ICD-10-CM | POA: Diagnosis not present

## 2021-03-04 DIAGNOSIS — J189 Pneumonia, unspecified organism: Secondary | ICD-10-CM | POA: Diagnosis not present

## 2021-03-04 DIAGNOSIS — R2681 Unsteadiness on feet: Secondary | ICD-10-CM | POA: Diagnosis not present

## 2021-03-04 DIAGNOSIS — N183 Chronic kidney disease, stage 3 unspecified: Secondary | ICD-10-CM | POA: Diagnosis not present

## 2021-03-04 DIAGNOSIS — J9691 Respiratory failure, unspecified with hypoxia: Secondary | ICD-10-CM | POA: Diagnosis not present

## 2021-03-04 DIAGNOSIS — I679 Cerebrovascular disease, unspecified: Secondary | ICD-10-CM | POA: Diagnosis not present

## 2021-03-04 DIAGNOSIS — F0150 Vascular dementia without behavioral disturbance: Secondary | ICD-10-CM | POA: Diagnosis not present

## 2021-03-10 ENCOUNTER — Telehealth: Payer: Self-pay

## 2021-03-10 NOTE — Telephone Encounter (Signed)
[name] with [facility] requesting VO for speech therapy. Please call back.

## 2021-03-10 NOTE — Telephone Encounter (Signed)
Return call to [facility] [place] - requesting verbal order for speech therapy to work on cognitive development and to work with his daughter. VO given for "ST once a week x 4 weeks". If not appropriate, let me know. Thanks

## 2021-03-12 DIAGNOSIS — N183 Chronic kidney disease, stage 3 unspecified: Secondary | ICD-10-CM | POA: Diagnosis not present

## 2021-03-12 DIAGNOSIS — I130 Hypertensive heart and chronic kidney disease with heart failure and stage 1 through stage 4 chronic kidney disease, or unspecified chronic kidney disease: Secondary | ICD-10-CM | POA: Diagnosis not present

## 2021-03-12 DIAGNOSIS — I5032 Chronic diastolic (congestive) heart failure: Secondary | ICD-10-CM | POA: Diagnosis not present

## 2021-03-18 ENCOUNTER — Telehealth: Payer: Self-pay | Admitting: Internal Medicine

## 2021-03-18 NOTE — Telephone Encounter (Signed)
Costella Hatcher, therapist with [facility], requesting order to follow patient 1x a week for the next 5 weeks.  Call back telephone number is [phone].  Forwarding message to PCP and triage nurse.

## 2021-03-21 ENCOUNTER — Encounter: Payer: Self-pay | Admitting: Pharmacist

## 2021-03-21 ENCOUNTER — Ambulatory Visit (INDEPENDENT_AMBULATORY_CARE_PROVIDER_SITE_OTHER): Payer: Medicare HMO | Admitting: Pharmacist

## 2021-03-21 VITALS — BP 136/75

## 2021-03-21 DIAGNOSIS — Z0000 Encounter for general adult medical examination without abnormal findings: Secondary | ICD-10-CM

## 2021-03-21 NOTE — Progress Notes (Signed)
This AWV is being conducted by Ronda only. The patient was located at home and I was located in [facility]. The patient's identity was confirmed using their DOB and current address. The patient or his/her legal guardian has consented to being evaluated through a telephone encounter and understands the associated risks (an examination cannot be done and the patient may need to come in for an appointment) / benefits (allows the patient to remain at home, decreasing exposure to coronavirus). I personally spent 35 minutes conducting the AWV.  Subjective:   [name] is a 88 y.o. male who presents for a Medicare Annual Wellness Visit.  The following items have been reviewed and updated today in the appropriate area in the EMR.   Health Risk Assessment  Height, weight, BMI, and BP Visual acuity if needed Depression screen Fall risk / safety level Advance directive discussion Medical and family history were reviewed and updated Updating list of other providers & suppliers Medication reconciliation, including over the counter medicines Cognitive screen Written screening schedule Risk Factor list Personalized health advice, risky behaviors, and treatment advice  Social History   Social History Narrative   Current Social History 03/21/2021      Patient lives with his daughter, [name] in a home which is 1 story. There are 3 steps up to the entrance with handrails and a ramp which the patient uses.       Patient's method of transportation is via his daughter.      The highest level of education was 10th grade      The patient is currently retired.      Identified important Relationships as his daughter, [name].       Pets : No       Interests / Fun: He enjoys sitting outside on the porch.       Current Stressors: Can't do what he used to be able to do, per his daughter       Religious / Personal Beliefs: Catholic               Cardiac Risk Factors include:  advanced age (>75men, >46 women);hypertension;dyslipidemia;male gender;sedentary lifestyle    Objective:    Vitals: BP 136/75  Vitals are patient reported  Activities of Daily Living In your present state of health, do you have any difficulty performing the following activities: 03/21/2021 02/10/2021  Hearing? Y Y  Comment - -  Vision? N N  Comment - -  Difficulty concentrating or making decisions? N N  Comment - -  Walking or climbing stairs? Y Y  Dressing or bathing? Y Y  Doing errands, shopping? Tempie Donning  Preparing Food and eating ? Y -  Using the Toilet? Y -  In the past six months, have you accidently leaked urine? N -  Do you have problems with loss of bowel control? N -  Managing your Medications? Y -  Managing your Finances? Y -  Housekeeping or managing your Housekeeping? Y -  Some recent data might be hidden    Goals  Goals      Blood Pressure < 150/90     Increase strength in legs     Daughter will assist patient begin seated and standing exercises with exercise band.        Prevent Falls        Fall Risk Fall Risk  02/10/2021 01/13/2021 10/25/2020 10/07/2020 04/15/2020  Falls in the past year? 0 0 0 0 0  Number  falls in past yr: 0 0 - - 0  Comment - - - - -  Injury with Fall? 0 0 - - 0  Risk Factor Category  - - - - -  Risk for fall due to : Impaired balance/gait Impaired mobility - Impaired balance/gait;Impaired mobility -  Risk for fall due to: Comment - - - - -  Follow up Falls evaluation completed;Falls prevention discussed Falls evaluation completed;Falls prevention discussed - Falls prevention discussed -    Depression Screen PHQ 2/9 Scores 03/21/2021 02/10/2021 01/13/2021 10/25/2020  PHQ - 2 Score 0 0 0 0  PHQ- 9 Score - 0 - 0  Exception Documentation - - - -     Cognitive Testing Six-Item Cognitive Screener   "I would like to ask you some questions that ask you to use your memory. I am going to name three objects. Please wait until I say all three  words, then repeat them. Remember what they are  because I am going to ask you to name them again in a few minutes. Please repeat these words for me: APPLE--TABLE--PENNY. (Interviewer may repeat names 3 times if necessary but repetition not scored.)  Did patient correctly repeat all three words? No - cannot proceed with screen  What year is this? Incorrect What month is this? Correct What day of the week is this? Incorrect  What were the three objects I asked you to remember? [employer] Incorrect Table Incorrect [name] Incorrect  Score one point for each incorrect answer.  A score of 2 or more points warrants additional investigation.  Patient's score  Patient unable to complete due to difficulty hearing and also understanding     Assessment and Plan:    During the course of the visit the patient was educated and counseled about appropriate screening and preventive services as documented in the assessment and plan.  Recommended Shingles, Tetanus, and Covid booster vaccines.  Set up appointment with provider as patient's daughter concerned about worsened hearing and also possible UTI as she states patient wakes up soaked in urine each morning due to urinating while sleeping during night.  The printed AVS was given to the patient and included an updated screening schedule, a list of risk factors, and personalized health advice.        Hughes Better, RPH-CPP  03/21/2021

## 2021-03-21 NOTE — Patient Instructions (Addendum)
Annual Wellness Visit   Medicare Covered Preventative Screenings and Services  Services & Screenings Men and Women Who How Often Need? Date of Last Service Action  Abdominal Aortic Aneurysm Adults with AAA risk factors Once     Alcohol Misuse and Counseling All Adults Screening once a year if no alcohol misuse. Counseling up to 4 face to face sessions.     Bone Density Measurement  Adults at risk for osteoporosis Once every 2 yrs     Lipid Panel Z13.6 All adults without CV disease Once every 5 yrs     Colorectal Cancer  Stool sample or Colonoscopy All adults 60 and older  Once every year Every 10 years     Depression All Adults Once a year  Today   Diabetes Screening Blood glucose, post glucose load, or GTT Z13.1 All adults at risk Pre-diabetics Once per year Twice per year     Diabetes  Self-Management Training All adults Diabetics 10 hrs first year; 2 hours subsequent years. Requires Copay     Glaucoma Diabetics Family history of glaucoma African Americans 36 yrs + Hispanic Americans 54 yrs + Annually - requires coppay     Hepatitis C Z72.89 or F19.20 High Risk for HCV Born between 1945 and 1965 Annually Once     HIV Z11.4 All adults based on risk Annually btw ages 35 & 40 regardless of risk Annually > 65 yrs if at increased risk     Lung Cancer Screening Asymptomatic adults aged 66-77 with 30 pack yr history and current smoker OR quit within the last 15 yrs Annually Must have counseling and shared decision making documentation before first screen     Medical Nutrition Therapy Adults with  Diabetes Renal disease Kidney transplant within past 3 yrs 3 hours first year; 2 hours subsequent years     Obesity and Counseling All adults Screening once a year Counseling if BMI 30 or higher  Today   Tobacco Use Counseling Adults who use tobacco  Up to 8 visits in one year     Vaccines Z23 Hepatitis B Influenza  Pneumonia  Adults  Once Once every flu season Two different  vaccines separated by one year     Next Annual Wellness Visit People with Medicare Every year  Today     Services & Screenings Women Who How Often Need  Date of Last Service Action  Mammogram  Z12.31 Women over 52 One baseline ages 12-39. Annually ager 40 yrs+     Pap tests All women Annually if high risk. Every 2 yrs for normal risk women     Screening for cervical cancer with  Pap (Z01.419 nl or Z01.411abnl) & HPV Z11.51 Women aged 40 to 58 Once every 5 yrs     Screening pelvic and breast exams All women Annually if high risk. Every 2 yrs for normal risk women     Sexually Transmitted Diseases Chlamydia Gonorrhea Syphilis All at risk adults Annually for non pregnant females at increased risk         [place] Men Who How Ofter Need  Date of Last Service Action  Prostate Cancer - DRE & PSA Men over 50 Annually.  DRE might require a copay.     Sexually Transmitted Diseases Syphilis All at risk adults Annually for men at increased risk         Things That May Be Affecting Your Health:  Alcohol X Hearing loss  Pain    Depression  Home  Safety  Sexual Health   Diabetes X Lack of physical activity  Stress   Difficulty with daily activities  Loneliness  Tiredness   Drug use  Medicines  Tobacco use   Falls  Motor Vehicle Safety  Weight   Food choices  Oral Health  Other    YOUR PERSONALIZED HEALTH PLAN : 1. Schedule your next subsequent Medicare Wellness visit in one year 2. Attend all of your regular appointments to address your medical issues 3. Complete the preventative screenings and services 4. We recommend you obtain your Shingles, Tetanus, and Covid booster vaccines. If you obtain any vaccines outside of your Primary Care Provider's office please let us know so we may update your records.    Health Maintenance, Male Adopting a healthy lifestyle and getting preventive care are important in promoting health and wellness. Ask your health care provider  about: The right schedule for you to have regular tests and exams. Things you can do on your own to prevent diseases and keep yourself healthy. What should I know about diet, weight, and exercise? Eat a healthy diet  Eat a diet that includes plenty of vegetables, fruits, low-fat dairy products, and lean protein. Do not eat a lot of foods that are high in solid fats, added sugars, or sodium. Maintain a healthy weight Body mass index (BMI) is a measurement that can be used to identify possible weight problems. It estimates body fat based on height and weight. Your health care provider can help determine your BMI and help you achieve or maintain a healthy weight. Get regular exercise Get regular exercise. This is one of the most important things you can do for your health. Most adults should: Exercise for at least 150 minutes each week. The exercise should increase your heart rate and make you sweat (moderate-intensity exercise). Do strengthening exercises at least twice a week. This is in addition to the moderate-intensity exercise. Spend less time sitting. Even light physical activity can be beneficial. Watch cholesterol and blood lipids Have your blood tested for lipids and cholesterol at 86 years of age, then have this test every 5 years. You may need to have your cholesterol levels checked more often if: Your lipid or cholesterol levels are high. You are older than 86 years of age. You are at high risk for heart disease. What should I know about cancer screening? Many types of cancers can be detected early and may often be prevented. Depending on your health history and family history, you may need to have cancer screening at various ages. This may include screening for: Colorectal cancer. Prostate cancer. Skin cancer. Lung cancer. What should I know about heart disease, diabetes, and high blood pressure? Blood pressure and heart disease High blood pressure causes heart disease and  increases the risk of stroke. This is more likely to develop in people who have high blood pressure readings or are overweight. Talk with your health care provider about your target blood pressure readings. Have your blood pressure checked: Every 3-5 years if you are 69-30 years of age. Every year if you are 60 years old or older. If you are between the ages of 88 and 46 and are a current or former smoker, ask your health care provider if you should have a one-time screening for abdominal aortic aneurysm (AAA). Diabetes Have regular diabetes screenings. This checks your fasting blood sugar level. Have the screening done: Once every three years after age 49 if you are at a normal weight and  have a low risk for diabetes. More often and at a younger age if you are overweight or have a high risk for diabetes. What should I know about preventing infection? Hepatitis B If you have a higher risk for hepatitis B, you should be screened for this virus. Talk with your health care provider to find out if you are at risk for hepatitis B infection. Hepatitis C Blood testing is recommended for: Everyone born from [birth year] through 1965. Anyone with known risk factors for hepatitis C. Sexually transmitted infections (STIs) You should be screened each year for STIs, including gonorrhea and chlamydia, if: You are sexually active and are younger than 86 years of age. You are older than 86 years of age and your health care provider tells you that you are at risk for this type of infection. Your sexual activity has changed since you were last screened, and you are at increased risk for chlamydia or gonorrhea. Ask your health care provider if you are at risk. Ask your health care provider about whether you are at high risk for HIV. Your health care provider may recommend a prescription medicine to help prevent HIV infection. If you choose to take medicine to prevent HIV, you should first get tested for HIV. You should  then be tested every 3 months for as long as you are taking the medicine. Follow these instructions at home: Alcohol use Do not drink alcohol if your health care provider tells you not to drink. If you drink alcohol: Limit how much you have to 0-2 drinks a day. Know how much alcohol is in your drink. In the [place] one drink equals one 12 oz bottle of beer (355 mL), one 5 oz glass of wine (148 mL), or one 1 oz glass of hard liquor (44 mL). Lifestyle Do not use any products that contain nicotine or tobacco. These products include cigarettes, chewing tobacco, and vaping devices, such as e-cigarettes. If you need help quitting, ask your health care provider. Do not use street drugs. Do not share needles. Ask your health care provider for help if you need support or information about quitting drugs. General instructions Schedule regular health, dental, and eye exams. Stay current with your vaccines. Tell your health care provider if: You often feel depressed. You have ever been abused or do not feel safe at home. Summary Adopting a healthy lifestyle and getting preventive care are important in promoting health and wellness. Follow your health care provider's instructions about healthy diet, exercising, and getting tested or screened for diseases. Follow your health care provider's instructions on monitoring your cholesterol and blood pressure. This information is not intended to replace advice given to you by your health care provider. Make sure you discuss any questions you have with your health care provider. Document Revised: 07/19/2020 Document Reviewed: 07/19/2020 Elsevier Patient Education  [employer].

## 2021-03-25 ENCOUNTER — Telehealth: Payer: Self-pay | Admitting: *Deleted

## 2021-03-25 DIAGNOSIS — Z0189 Encounter for other specified special examinations: Secondary | ICD-10-CM | POA: Diagnosis not present

## 2021-03-25 NOTE — Telephone Encounter (Signed)
Will called back requesting VO for in and out cath if needed. Discussed with PCP and VO given.  Also, Will was instructed to contact [facility] Resident on Call with results.

## 2021-03-25 NOTE — Telephone Encounter (Signed)
Jimmy R, RN with [facility] called in requesting VO for UA/CX as patient's daughter reports he is having sx of another UTI; change in mannerisms, leaning to the right. Verbal auth given. Jimmy route to PCP for agreement/denial.

## 2021-03-25 NOTE — Telephone Encounter (Signed)
OK to obtain I&O for U/A and culture if needed

## 2021-03-31 ENCOUNTER — Ambulatory Visit (INDEPENDENT_AMBULATORY_CARE_PROVIDER_SITE_OTHER): Payer: Medicare HMO | Admitting: Internal Medicine

## 2021-03-31 ENCOUNTER — Encounter: Payer: Self-pay | Admitting: Internal Medicine

## 2021-03-31 DIAGNOSIS — N390 Urinary tract infection, site not specified: Secondary | ICD-10-CM

## 2021-03-31 DIAGNOSIS — R351 Nocturia: Secondary | ICD-10-CM

## 2021-03-31 NOTE — Patient Instructions (Signed)
I would like you to try to have him stop drinking fluids 2-3 hours before bed. If he wants a sip or two, that is completely fine, but our goal is to try to have his bladder empty in order to prolong the time that he is dry for.

## 2021-03-31 NOTE — Progress Notes (Signed)
Attestation for Documentation:  I personally was present and performed or re-performed the history, physical exam and medical decision-making activities of this service and have verified that the service and findings are accurately documented in the note.  Makalyn Lennox N, DO 03/31/2021, 3:19 PM

## 2021-04-01 ENCOUNTER — Telehealth: Payer: Self-pay

## 2021-04-01 ENCOUNTER — Encounter: Payer: Self-pay | Admitting: Internal Medicine

## 2021-04-01 NOTE — Telephone Encounter (Signed)
Spoke with patient's daughter [name] regarding Palliative Care and she declined services at this time. Will cancel referral and notify referring provider.

## 2021-04-01 NOTE — Assessment & Plan Note (Signed)
Results of recent UA via straight cath from home health reviewed. It does not show signs of active infection. Will resolve this issue.

## 2021-04-01 NOTE — Assessment & Plan Note (Deleted)
Chronic and stable issue. HPI: brought to the office today by his daughter to discuss her concerns of urinary incontinence at night. His daughter is his sole caregiver. She notes that she frequently changes his briefs throughout the day however his briefs are often completely saturated in the mornings upon waking up and she is concerned about him laying in it at night. They have previously tried using a condom catheter which she says worked but she could tell this caused significant discomfort for her father and therefore discontinued use.  Assessment: he is not on

## 2021-04-01 NOTE — Assessment & Plan Note (Signed)
Chronic and stable issue. HPI: brought to the office today by his daughter to discuss her concerns of urinary incontinence at night. His daughter is his sole caregiver. She notes that she frequently changes his briefs throughout the day however his briefs are often completely saturated in the mornings upon waking up and she is concerned about him laying in it at night. They have previously tried using a condom catheter which she says worked but she could tell this caused significant discomfort for her father and therefore discontinued use.  Assessment: he is not on any regular medications that are exacerbating this issue. I expressed appreciation for her concerns for her father. He is chronically incontinent of urine and his vascular dementia seems to be advanced to the extent that he can not urinate on command. He drinks a fairly large jug of water throughout the day which is available to him up until bedtime. I suspect that we can at least improve the degree of brief saturation by limiting his fluid intake 2-3 hours before bed. She was receptive to trying this. Fortunately, he does not have any skin breakdown and, especially due to his history of recurrent UTIs, do not think chronic catheterization is a good option.  Plan  Decrease fluid intake 2-3 hours before bed. If this does not work, we can try to push it back a couple more hours, as long as he is still taking adequate fluid during the day. Barrier cream may also be helpful in preventing skin breakdown, if the urine incontinence at night continues to be an issue. She will follow up with Dr. Heber Allardt for ongoing discussions.

## 2021-04-01 NOTE — Progress Notes (Signed)
Internal Medicine Clinic Attending  Case discussed with Dr. Christian  At the time of the visit.  We reviewed the resident's history and exam and pertinent patient test results.  I agree with the assessment, diagnosis, and plan of care documented in the resident's note.  

## 2021-04-01 NOTE — Progress Notes (Signed)
° °  Office Visit   Patient ID: [name], male    DOB: 04-04-1924, 88 y.o.   MRN: [id number]   PCP: [name], DO   Subjective:   [name] is a 88 y.o. year old male with chronic conditions that include vascular dementia and urinary incontinence who was brought to the office today by his daughter to discuss her concerns of urinary incontinence at night. Please refer to problem based charting for details.  Objective:   BP 137/76 (BP Location: Right Arm, Patient Position: Sitting, Cuff Size: Normal)    Pulse 69    Temp 98.1 F (36.7 C) (Oral)    Wt 185 lb 8 oz (84.1 kg)    SpO2 96%    BMI 27.39 kg/m   General: well appearing in no distress, happy smiling Cardiac: RRR Pulm: lungs clear throughout Abd: soft, nontender, no suprapubic tenderness Skin: no rash or skin breakdown of the buttocks or groin  Assessment & Plan:   Problem List Items Addressed This Visit       Genitourinary   RESOLVED: UTI (urinary tract infection)    Results of recent UA via straight cath from home health reviewed. It does not show signs of active infection. Will resolve this issue.         Other   Nocturia    Chronic and stable issue. HPI: brought to the office today by his daughter to discuss her concerns of urinary incontinence at night. His daughter is his sole caregiver. She notes that she frequently changes his briefs throughout the day however his briefs are often completely saturated in the mornings upon waking up and she is concerned about him laying in it at night. They have previously tried using a condom catheter which she says worked but she could tell this caused significant discomfort for her father and therefore discontinued use.  Assessment: he is not on any regular medications that are exacerbating this issue. I expressed appreciation for her concerns for her father. He is chronically incontinent of urine and his vascular dementia seems to be advanced to the extent that he can  not urinate on command. He drinks a fairly large jug of water throughout the day which is available to him up until bedtime. I suspect that we can at least improve the degree of brief saturation by limiting his fluid intake 2-3 hours before bed. She was receptive to trying this. Fortunately, he does not have any skin breakdown and, especially due to his history of recurrent UTIs, do not think chronic catheterization is a good option.  Plan Decrease fluid intake 2-3 hours before bed. If this does not work, we can try to push it back a couple more hours, as long as he is still taking adequate fluid during the day. Barrier cream may also be helpful in preventing skin breakdown, if the urine incontinence at night continues to be an issue. She will follow up with Dr. Heber Alba for ongoing discussions.        Return if symptoms worsen or fail to improve..   Pt discussed with Dr. Tressa Busman, MD Internal Medicine Resident PGY-3 [facility] 04/01/2021 6:29 AM

## 2021-04-03 NOTE — Progress Notes (Signed)
Evaluation and management procedures were performed by the Clinical Pharmacy Practitioner under my supervision and collaboration. I have reviewed the Practitioner's note and chart, and I agree with the management and plan as documented above. ° °

## 2021-04-06 NOTE — Telephone Encounter (Signed)
Agree 

## 2021-04-12 DIAGNOSIS — I130 Hypertensive heart and chronic kidney disease with heart failure and stage 1 through stage 4 chronic kidney disease, or unspecified chronic kidney disease: Secondary | ICD-10-CM | POA: Diagnosis not present

## 2021-04-12 DIAGNOSIS — N183 Chronic kidney disease, stage 3 unspecified: Secondary | ICD-10-CM | POA: Diagnosis not present

## 2021-04-12 DIAGNOSIS — I5032 Chronic diastolic (congestive) heart failure: Secondary | ICD-10-CM | POA: Diagnosis not present

## 2021-04-25 DIAGNOSIS — R410 Disorientation, unspecified: Secondary | ICD-10-CM | POA: Diagnosis not present

## 2021-04-25 DIAGNOSIS — R739 Hyperglycemia, unspecified: Secondary | ICD-10-CM | POA: Diagnosis not present

## 2021-04-25 DIAGNOSIS — I10 Essential (primary) hypertension: Secondary | ICD-10-CM | POA: Diagnosis not present

## 2021-04-25 DIAGNOSIS — R112 Nausea with vomiting, unspecified: Secondary | ICD-10-CM | POA: Diagnosis not present

## 2021-04-25 DIAGNOSIS — R509 Fever, unspecified: Secondary | ICD-10-CM | POA: Diagnosis not present

## 2021-04-26 ENCOUNTER — Other Ambulatory Visit: Payer: Self-pay

## 2021-04-26 ENCOUNTER — Emergency Department (HOSPITAL_COMMUNITY): Payer: Medicare HMO

## 2021-04-26 ENCOUNTER — Inpatient Hospital Stay (HOSPITAL_COMMUNITY)
Admission: EM | Admit: 2021-04-26 | Discharge: 2021-04-27 | DRG: 177 | Disposition: A | Discharge status: Home Health | Payer: Medicare HMO | Attending: Internal Medicine | Admitting: Internal Medicine

## 2021-04-26 ENCOUNTER — Encounter (HOSPITAL_COMMUNITY): Payer: Self-pay

## 2021-04-26 DIAGNOSIS — D3502 Benign neoplasm of left adrenal gland: Secondary | ICD-10-CM | POA: Diagnosis present

## 2021-04-26 DIAGNOSIS — I5030 Unspecified diastolic (congestive) heart failure: Secondary | ICD-10-CM | POA: Diagnosis present

## 2021-04-26 DIAGNOSIS — I517 Cardiomegaly: Secondary | ICD-10-CM | POA: Diagnosis not present

## 2021-04-26 DIAGNOSIS — J9601 Acute respiratory failure with hypoxia: Secondary | ICD-10-CM | POA: Diagnosis not present

## 2021-04-26 DIAGNOSIS — Z79899 Other long term (current) drug therapy: Secondary | ICD-10-CM

## 2021-04-26 DIAGNOSIS — Z28311 Partially vaccinated for covid-19: Secondary | ICD-10-CM | POA: Diagnosis not present

## 2021-04-26 DIAGNOSIS — Z66 Do not resuscitate: Secondary | ICD-10-CM | POA: Diagnosis present

## 2021-04-26 DIAGNOSIS — Z8673 Personal history of transient ischemic attack (TIA), and cerebral infarction without residual deficits: Secondary | ICD-10-CM

## 2021-04-26 DIAGNOSIS — N400 Enlarged prostate without lower urinary tract symptoms: Secondary | ICD-10-CM | POA: Diagnosis present

## 2021-04-26 DIAGNOSIS — I7121 Aneurysm of the ascending aorta, without rupture: Secondary | ICD-10-CM | POA: Diagnosis not present

## 2021-04-26 DIAGNOSIS — R000 Tachycardia, unspecified: Secondary | ICD-10-CM | POA: Diagnosis not present

## 2021-04-26 DIAGNOSIS — I719 Aortic aneurysm of unspecified site, without rupture: Secondary | ICD-10-CM | POA: Diagnosis present

## 2021-04-26 DIAGNOSIS — J1282 Pneumonia due to coronavirus disease 2019: Secondary | ICD-10-CM | POA: Diagnosis present

## 2021-04-26 DIAGNOSIS — N1832 Chronic kidney disease, stage 3b: Secondary | ICD-10-CM | POA: Diagnosis not present

## 2021-04-26 DIAGNOSIS — Z8249 Family history of ischemic heart disease and other diseases of the circulatory system: Secondary | ICD-10-CM | POA: Diagnosis not present

## 2021-04-26 DIAGNOSIS — D3501 Benign neoplasm of right adrenal gland: Secondary | ICD-10-CM | POA: Diagnosis present

## 2021-04-26 DIAGNOSIS — D6959 Other secondary thrombocytopenia: Secondary | ICD-10-CM | POA: Diagnosis not present

## 2021-04-26 DIAGNOSIS — I712 Thoracic aortic aneurysm, without rupture, unspecified: Secondary | ICD-10-CM | POA: Diagnosis present

## 2021-04-26 DIAGNOSIS — K8020 Calculus of gallbladder without cholecystitis without obstruction: Secondary | ICD-10-CM | POA: Diagnosis present

## 2021-04-26 DIAGNOSIS — F01C0 Vascular dementia, severe, without behavioral disturbance, psychotic disturbance, mood disturbance, and anxiety: Secondary | ICD-10-CM | POA: Diagnosis not present

## 2021-04-26 DIAGNOSIS — I130 Hypertensive heart and chronic kidney disease with heart failure and stage 1 through stage 4 chronic kidney disease, or unspecified chronic kidney disease: Secondary | ICD-10-CM | POA: Diagnosis not present

## 2021-04-26 DIAGNOSIS — R4182 Altered mental status, unspecified: Secondary | ICD-10-CM | POA: Diagnosis not present

## 2021-04-26 DIAGNOSIS — Z833 Family history of diabetes mellitus: Secondary | ICD-10-CM

## 2021-04-26 DIAGNOSIS — Z7982 Long term (current) use of aspirin: Secondary | ICD-10-CM | POA: Diagnosis not present

## 2021-04-26 DIAGNOSIS — J439 Emphysema, unspecified: Secondary | ICD-10-CM | POA: Diagnosis not present

## 2021-04-26 DIAGNOSIS — U071 COVID-19: Secondary | ICD-10-CM | POA: Diagnosis not present

## 2021-04-26 DIAGNOSIS — J9600 Acute respiratory failure, unspecified whether with hypoxia or hypercapnia: Secondary | ICD-10-CM | POA: Diagnosis present

## 2021-04-26 DIAGNOSIS — N281 Cyst of kidney, acquired: Secondary | ICD-10-CM | POA: Diagnosis not present

## 2021-04-26 DIAGNOSIS — J9811 Atelectasis: Secondary | ICD-10-CM | POA: Diagnosis not present

## 2021-04-26 LAB — CBC WITH DIFFERENTIAL/PLATELET
Abs Immature Granulocytes: 0.03 10*3/uL (ref 0.00–0.07)
Basophils Absolute: 0 10*3/uL (ref 0.0–0.1)
Basophils Relative: 0 %
Eosinophils Absolute: 0.1 10*3/uL (ref 0.0–0.5)
Eosinophils Relative: 2 %
HCT: 38.5 % — ABNORMAL LOW (ref 39.0–52.0)
Hemoglobin: 11.9 g/dL — ABNORMAL LOW (ref 13.0–17.0)
Immature Granulocytes: 0 %
Lymphocytes Relative: 4 %
Lymphs Abs: 0.3 10*3/uL — ABNORMAL LOW (ref 0.7–4.0)
MCH: 28.5 pg (ref 26.0–34.0)
MCHC: 30.9 g/dL (ref 30.0–36.0)
MCV: 92.1 fL (ref 80.0–100.0)
Monocytes Absolute: 0.7 10*3/uL (ref 0.1–1.0)
Monocytes Relative: 10 %
Neutro Abs: 5.6 10*3/uL (ref 1.7–7.7)
Neutrophils Relative %: 84 %
Platelets: 143 10*3/uL — ABNORMAL LOW (ref 150–400)
RBC: 4.18 MIL/uL — ABNORMAL LOW (ref 4.22–5.81)
RDW: 15.5 % (ref 11.5–15.5)
WBC: 6.7 10*3/uL (ref 4.0–10.5)
nRBC: 0 % (ref 0.0–0.2)

## 2021-04-26 LAB — URINALYSIS, ROUTINE W REFLEX MICROSCOPIC
Bacteria, UA: NONE SEEN
Bilirubin Urine: NEGATIVE
Glucose, UA: NEGATIVE mg/dL
Ketones, ur: NEGATIVE mg/dL
Leukocytes,Ua: NEGATIVE
Nitrite: NEGATIVE
Protein, ur: 30 mg/dL — AB
Specific Gravity, Urine: 1.013 (ref 1.005–1.030)
pH: 5 (ref 5.0–8.0)

## 2021-04-26 LAB — TROPONIN I (HIGH SENSITIVITY)
Troponin I (High Sensitivity): 21 ng/L — ABNORMAL HIGH
Troponin I (High Sensitivity): 24 ng/L — ABNORMAL HIGH (ref <18)

## 2021-04-26 LAB — COMPREHENSIVE METABOLIC PANEL
ALT: 12 U/L (ref 0–44)
AST: 23 U/L (ref 15–41)
Albumin: 3.7 g/dL (ref 3.5–5.0)
Alkaline Phosphatase: 55 U/L (ref 38–126)
Anion gap: 8 (ref 5–15)
BUN: 23 mg/dL (ref 8–23)
CO2: 25 mmol/L (ref 22–32)
Calcium: 10.6 mg/dL — ABNORMAL HIGH (ref 8.9–10.3)
Chloride: 110 mmol/L (ref 98–111)
Creatinine, Ser: 1.38 mg/dL — ABNORMAL HIGH (ref 0.61–1.24)
GFR, Estimated: 47 mL/min — ABNORMAL LOW (ref >60)
Glucose, Bld: 154 mg/dL — ABNORMAL HIGH (ref 70–99)
Potassium: 4.2 mmol/L (ref 3.5–5.1)
Sodium: 143 mmol/L (ref 135–145)
Total Bilirubin: 0.4 mg/dL (ref 0.3–1.2)
Total Protein: 7.1 g/dL (ref 6.5–8.1)

## 2021-04-26 LAB — RESP PANEL BY RT-PCR (FLU A&B, COVID) ARPGX2
Influenza A by PCR: NEGATIVE
Influenza B by PCR: NEGATIVE
SARS Coronavirus 2 by RT PCR: POSITIVE — AB

## 2021-04-26 LAB — CBG MONITORING, ED: Glucose-Capillary: 155 mg/dL — ABNORMAL HIGH (ref 70–99)

## 2021-04-26 LAB — LACTIC ACID, PLASMA: Lactic Acid, Venous: 1.8 mmol/L (ref 0.5–1.9)

## 2021-04-26 MED ORDER — LACTATED RINGERS IV SOLN: INTRAVENOUS | Status: DC

## 2021-04-26 MED ORDER — ATORVASTATIN CALCIUM 10 MG PO TABS
20.0000 mg | ORAL_TABLET | Freq: Every day | ORAL | Status: DC
  Administered 2021-04-27: 20 mg via ORAL
  Filled 2021-04-26: qty 2

## 2021-04-26 MED ORDER — ACETAMINOPHEN 325 MG PO TABS: 650.0000 mg | ORAL_TABLET | Freq: Four times a day (QID) | ORAL | Status: DC | PRN

## 2021-04-26 MED ORDER — DEXAMETHASONE 6 MG PO TABS
6.0000 mg | ORAL_TABLET | ORAL | Status: DC
  Administered 2021-04-26: 6 mg via ORAL
  Filled 2021-04-26: qty 2
  Filled 2021-04-26: qty 1

## 2021-04-26 MED ORDER — ASPIRIN EC 81 MG PO TBEC
81.0000 mg | DELAYED_RELEASE_TABLET | Freq: Every day | ORAL | Status: DC
Start: 2021-04-26 — End: 2021-04-27
  Administered 2021-04-26 – 2021-04-27 (×2): 81 mg via ORAL
  Filled 2021-04-26 (×2): qty 1

## 2021-04-26 MED ORDER — IOHEXOL 350 MG/ML SOLN
100.0000 mL | Freq: Once | INTRAVENOUS | Status: AC | PRN
  Administered 2021-04-26: 68 mL via INTRAVENOUS

## 2021-04-26 MED ORDER — AMLODIPINE BESYLATE 10 MG PO TABS
10.0000 mg | ORAL_TABLET | Freq: Every morning | ORAL | Status: DC
  Administered 2021-04-26 – 2021-04-27 (×2): 10 mg via ORAL
  Filled 2021-04-26: qty 2
  Filled 2021-04-26: qty 1

## 2021-04-26 MED ORDER — ENOXAPARIN SODIUM 40 MG/0.4ML IJ SOSY
40.0000 mg | PREFILLED_SYRINGE | INTRAMUSCULAR | Status: DC
  Administered 2021-04-26 – 2021-04-27 (×2): 40 mg via SUBCUTANEOUS
  Filled 2021-04-26 (×2): qty 0.4

## 2021-04-26 NOTE — ED Notes (Signed)
Pt incontinent  Pt was changed

## 2021-04-26 NOTE — Progress Notes (Unsigned)
° °  This pt is an active pt with [facility]- An in home palliative care program provided by [facility].  Pt is receiving nursing and SW visits routinely.  They are eligible for Home Health services with our program if this is needed at d/c.   Please call or let me know if we can assist with d/c plans.  Webb Silversmith RN [phone]

## 2021-04-26 NOTE — ED Triage Notes (Signed)
BIB EMS from home , family states Pt has been more confused and has had strong smelling urine.

## 2021-04-26 NOTE — ED Provider Notes (Signed)
[facility] EMERGENCY DEPARTMENT Provider Note   CSN: [id number] Arrival date & time: 04/26/21  0014     History  Chief Complaint  Patient presents with   Altered Mental Status    [name] is a 88 y.o. male.  Patient presents to the emergency department for evaluation of mental status changes.  Symptoms ongoing for couple of days.  Patient lives with family, information provided by family.  They reportedly have been needing to change his diapers more frequently and his urine is malodorous.  He has had similar symptoms previously with UTI.      Home Medications Prior to Admission medications   Medication Sig Start Date End Date Taking? Authorizing Provider  amLODipine (NORVASC) 5 MG tablet Take 1 tablet (5 mg total) by mouth 2 (two) times daily. Patient taking differently: Take 10 mg by mouth every morning. 01/13/21 01/13/22  [name], DO  aspirin 81 MG EC tablet Take 1 tablet (81 mg total) by mouth daily. 07/23/20   Maudie Mercury, MD  atorvastatin (LIPITOR) 20 MG tablet Take 1 tablet (20 mg total) by mouth daily. 09/16/20   [name], DO  furosemide (LASIX) 40 MG tablet Take 1 tablet (40 mg total) by mouth daily as needed for fluid. Patient not taking: Reported on 03/21/2021 07/23/20   Maudie Mercury, MD  triamcinolone ointment (KENALOG) 0.1 % Apply 1 application topically 2 (two) times daily as needed (For leg rash). 07/23/20   Maudie Mercury, MD      Allergies    Patient has no known allergies.    Review of Systems   Review of Systems  Unable to perform ROS: Mental status change   Physical Exam Updated Vital Signs BP 131/75    Pulse 83    Temp 98.9 F (37.2 C)    Resp (!) 22    Ht 6' (1.829 m)    Wt 81.6 kg    SpO2 98%    BMI 24.41 kg/m  Physical Exam Vitals and nursing note reviewed.  Constitutional:      General: He is not in acute distress.    Appearance: He is well-developed.  HENT:     Head: Normocephalic and atraumatic.      Mouth/Throat:     Mouth: Mucous membranes are moist.  Eyes:     General: Vision grossly intact. Gaze aligned appropriately.     Extraocular Movements: Extraocular movements intact.     Conjunctiva/sclera: Conjunctivae normal.  Cardiovascular:     Rate and Rhythm: Normal rate and regular rhythm.     Pulses: Normal pulses.     Heart sounds: Normal heart sounds, S1 normal and S2 normal. No murmur heard.   No friction rub. No gallop.  Pulmonary:     Effort: Pulmonary effort is normal. No respiratory distress.     Breath sounds: Normal breath sounds.  Abdominal:     Palpations: Abdomen is soft.     Tenderness: There is no abdominal tenderness. There is no guarding or rebound.     Hernia: No hernia is present.  Musculoskeletal:        General: No swelling.     Cervical back: Full passive range of motion without pain, normal range of motion and neck supple. No pain with movement, spinous process tenderness or muscular tenderness. Normal range of motion.     Right lower leg: No edema.     Left lower leg: No edema.  Skin:    General: Skin  is warm and dry.     Capillary Refill: Capillary refill takes less than 2 seconds.     Findings: No ecchymosis, erythema, lesion or wound.  Neurological:     Mental Status: He is alert. He is confused.     GCS: GCS eye subscore is 4. GCS verbal subscore is 4. GCS motor subscore is 6.     Cranial Nerves: Cranial nerves 2-12 are intact.     Sensory: Sensation is intact.     Motor: Motor function is intact. No weakness or abnormal muscle tone.     Coordination: Coordination is intact.  Psychiatric:        Mood and Affect: Mood normal.        Speech: Speech normal.        Behavior: Behavior normal.    ED Results / Procedures / Treatments   Labs (all labs ordered are listed, but only abnormal results are displayed) Labs Reviewed  RESP PANEL BY RT-PCR (FLU A&B, COVID) ARPGX2 - Abnormal; Notable for the following components:      Result Value   SARS  Coronavirus 2 by RT PCR POSITIVE (*)    All other components within normal limits  CBC WITH DIFFERENTIAL/PLATELET - Abnormal; Notable for the following components:   RBC 4.18 (*)    Hemoglobin 11.9 (*)    HCT 38.5 (*)    Platelets 143 (*)    Lymphs Abs 0.3 (*)    All other components within normal limits  COMPREHENSIVE METABOLIC PANEL - Abnormal; Notable for the following components:   Glucose, Bld 154 (*)    Creatinine, Ser 1.38 (*)    Calcium 10.6 (*)    GFR, Estimated 47 (*)    All other components within normal limits  URINALYSIS, ROUTINE W REFLEX MICROSCOPIC - Abnormal; Notable for the following components:   Hgb urine dipstick MODERATE (*)    Protein, ur 30 (*)    All other components within normal limits  CBG MONITORING, ED - Abnormal; Notable for the following components:   Glucose-Capillary 155 (*)    All other components within normal limits  TROPONIN I (HIGH SENSITIVITY) - Abnormal; Notable for the following components:   Troponin I (High Sensitivity) 21 (*)    All other components within normal limits  TROPONIN I (HIGH SENSITIVITY) - Abnormal; Notable for the following components:   Troponin I (High Sensitivity) 24 (*)    All other components within normal limits  URINE CULTURE  LACTIC ACID, PLASMA    EKG None  Radiology CT HEAD WO CONTRAST (5MM)  Result Date: 04/26/2021 CLINICAL DATA:  Mental status change, unknown cause. EXAM: CT HEAD WITHOUT CONTRAST TECHNIQUE: Contiguous axial images were obtained from the base of the skull through the vertex without intravenous contrast. RADIATION DOSE REDUCTION: This exam was performed according to the departmental dose-optimization program which includes automated exposure control, adjustment of the mA and/or kV according to patient size and/or use of iterative reconstruction technique. COMPARISON:  02/12/2021. FINDINGS: Brain: No acute intracranial hemorrhage, midline shift or mass effect. Diffuse atrophy is noted.  Subcortical and periventricular white matter hypodensities are noted bilaterally. There is an old infarct in the frontal lobe on the right. Old infarcts are noted in the cerebellar hemispheres and occipital lobes bilaterally. Old infarcts are present in the basal ganglia bilaterally. There is an extra-axial hyperdense lesion with calcifications along the floor of the frontal bone on the left measuring 2.7 cm, compatible with hemangioma. No hydrocephalus. Vascular: No hyperdense vessel  or unexpected calcification. Skull: Normal. Negative for fracture or focal lesion. Sinuses/Orbits: No acute finding. Other: None. IMPRESSION: 1. No acute intracranial process. 2. Atrophy with extensive chronic microvascular ischemic changes and multifocal infarcts. 3. Anterior cranial fossa meningioma on the left, unchanged. Electronically Signed   By: Brett Fairy M.D.   On: 04/26/2021 02:00   DG Chest Port 1 View  Result Date: 04/26/2021 CLINICAL DATA:  Mental status change EXAM: PORTABLE CHEST 1 VIEW COMPARISON:  02/12/2021 FINDINGS: The heart size and mediastinal contours are within normal limits. Both lungs are clear. The visualized skeletal structures are unremarkable. IMPRESSION: No active disease. Electronically Signed   By: Ulyses Jarred M.D.   On: 04/26/2021 01:01    Procedures Procedures    Medications Ordered in ED Medications - No data to display  ED Course/ Medical Decision Making/ A&P                           Medical Decision Making Amount and/or Complexity of Data Reviewed Labs: ordered. Radiology: ordered.   Patient presents to the emergency department with mental status changes.  He comes to the ER from home.  Information provided by family to EMS.  Family was concerned about the possibility of UTI because he had frequent malodorous urination.  He has had previous UTI with similar symptoms.  Patient's blood work and urinalysis did not give an answer.  CT head unremarkable, chest x-ray without  infiltrate.  His COVID, however, is positive.  He was mildly tachycardic, tachypneic and possibly has a new oxygen requirement at this time.  We will therefore perform CT PE study and reevaluate.  Will sign out to oncoming ER physician to follow-up on PE study and disposition patient.        Final Clinical Impression(s) / ED Diagnoses Final diagnoses:  COVID-19    Rx / DC Orders ED Discharge Orders     None         Quantavious Eggert, Gwenyth Allegra, MD 04/26/21 [phone]

## 2021-04-26 NOTE — ED Provider Notes (Signed)
7:08 AM Care assumed from Dr. Betsey Holiday.  At time of transfer of care, patient is awaiting results of CT PE study.  Patient reportedly had altered mental status and was found to be COVID-positive.  There was no evidence of UTI on urinalysis.  Plan of care is to reassess patient after CT PE study to determine disposition.  PE study showed no PE and no pneumonia.  Patient was developing intermittent hypoxia requiring oxygen.  Medicine team was called who will see patient.  They saw and initially were considering discharge however he then got hypoxic with them so they will admit for further management of COVID-19 and hypoxia.  Clinical Impression: 1. COVID-19     Disposition: Admit  This note was prepared with assistance of Dragon voice recognition software. Occasional wrong-word or sound-a-like substitutions may have occurred due to the inherent limitations of voice recognition software.     Inger Wiest, Gwenyth Allegra, MD 04/26/21 1043

## 2021-04-26 NOTE — ED Notes (Signed)
Daughter [name] [phone] would like an update and worried her father doesn't understand bc he's hard of hearing

## 2021-04-26 NOTE — H&P (Signed)
NAME:  [name], MRN:  [id number], DOB:  04-07-1924, LOS: 0 ADMISSION DATE:  04/26/2021, Primary: [name], DO  CHIEF COMPLAINT:  change in mental status   Medical Service: Internal Medicine Teaching Service         Attending Physician: Dr. Lottie Mussel, MD    First Contact: Dr. Marlou Sa Pager: [phone]  Second Contact: Dr. Johnney Ou Pager: [phone]       After Hours (After 5p/  First Contact Pager: [phone]  weekends / holidays): Second Contact Pager: [phone]    History of present illness   [name] is a 88 year old male with moderate-severe vascular dementia, BPH with urinary incontinence and history of stroke with presented to the ED via EMS for 2d history of increased confusion from his baseline dementia. Patient is unable to participate in history taking due to mental status. History primarily obtained from chart review and discussion with his primary caregiver, his daughter.   In addition to worsening mental status, she notes that his urine has had a strong smell over the past couple of days. She notes increased weakness and almost fell while ambulating yesterday. She denies fevers, chills, cough, shortness of breath, nausea, vomiting, abdominal pain, constipation, diarrhea, or any other acute changes aside from those noted above.   Brief ED Summary: Noted to be mildly hypoxic in the ED, requiring 1-2L of supplemental oxygen. COVID +. UA not consistent with UTI. No electrolytes derangements, renal function at baseline. No leukocytosis, mild thormbocytopenia, normal lactate. No infiltrate or effusion on CXR. No PE on CTA. No acute findings on head CT.  Past Medical History  He,  has a past medical history of Anemia, BPH (benign prostatic hypertrophy), Cerebral embolism with cerebral infarction ([place]) (12/19/2013), Cerebrovascular accident (CVA) ([place]) (04/17/2019), Chronic kidney disease, COVID-19 (03/19/2020), Difficulty hearing, right, DVT (deep venous thrombosis) ([place]),  Frequent falls, History of DVT (deep vein thrombosis) (06/10/2013), Hyperlipidemia, Hypertension, Incontinence of urine, Left adrenal mass ([place]) (09/11/2013), Meningioma ([place]), Stroke ([place]), Thrombocytopenia ([place]), and Vitreous hemorrhage ([place]) (12/21/2013).   Home Medications     Prior to Admission medications   Medication Sig Start Date End Date Taking? Authorizing Provider  amLODipine (NORVASC) 5 MG tablet Take 1 tablet (5 mg total) by mouth 2 (two) times daily. Patient taking differently: Take 10 mg by mouth every morning. 01/13/21 01/13/22  [name], DO  aspirin 81 MG EC tablet Take 1 tablet (81 mg total) by mouth daily. 07/23/20   Maudie Mercury, MD  atorvastatin (LIPITOR) 20 MG tablet Take 1 tablet (20 mg total) by mouth daily. 09/16/20   [name], DO  furosemide (LASIX) 40 MG tablet Take 1 tablet (40 mg total) by mouth daily as needed for fluid. Patient not taking: Reported on 03/21/2021 07/23/20   Maudie Mercury, MD  triamcinolone ointment (KENALOG) 0.1 % Apply 1 application topically 2 (two) times daily as needed (For leg rash). 07/23/20   Maudie Mercury, MD    Allergies    Allergies as of 04/26/2021   (No Known Allergies)    Social History   reports that he has never smoked. He has never used smokeless tobacco. He reports that he does not drink alcohol and does not use drugs.   Family History   His family history includes Diabetes in his mother; Heart disease in his father and mother; Hypertension in his daughter, father, and mother; Thyroid disease in his daughter.   ROS  Unable to be obtained due to dementia.  Objective   Blood pressure 116/75, pulse 72, temperature 98.9 F (37.2 C), resp. rate (!) 21, height 6' (1.829 m), weight 81.6 kg, SpO2 98 %.    General: chronically ill elderly appearing male resting comfortably in bed HEENT: MMM, head atraumatic Eyes: no scleral icterus or conjunctival injection Cardiac: RRR, no LE edema Pulm: breathing comfortably on  1L supplemental oxygen. No accessory muscle use. Lungs clear throughout GI: abd soft, non-tender, non-distended Skin: diffuse seborrheic keratosis, no rash Neuro: alert. Follows commands intermittently.  Significant Diagnostic Tests:   CBC Latest Ref Rng & Units 04/26/2021 02/15/2021 02/14/2021  WBC 4.0 - 10.5 K/uL 6.7 6.0 7.1  Hemoglobin 13.0 - 17.0 g/dL 11.9(L) 9.8(L) 9.6(L)  Hematocrit 39.0 - 52.0 % 38.5(L) 32.6(L) 32.1(L)  Platelets 150 - 400 K/uL 143(L) 162 152   BMP Latest Ref Rng & Units 04/26/2021 02/15/2021 02/14/2021  Glucose 70 - 99 mg/dL 154(H) 100(H) 112(H)  BUN 8 - 23 mg/dL '23 17 20  ' Creatinine 0.61 - 1.24 mg/dL 1.38(H) 1.27(H) 1.28(H)  BUN/Creat Ratio 10 - 24 - - -  Sodium 135 - 145 mmol/L 143 139 141  Potassium 3.5 - 5.1 mmol/L 4.2 3.6 3.5  Chloride 98 - 111 mmol/L 110 107 110  CO2 22 - 32 mmol/L '25 25 26  ' Calcium 8.9 - 10.3 mg/dL 10.6(H) 9.7 9.6   Hepatic Function Latest Ref Rng & Units 04/26/2021 02/12/2021 10/14/2020  Total Protein 6.5 - 8.1 g/dL 7.1 6.3(L) 6.5  Albumin 3.5 - 5.0 g/dL 3.7 2.7(L) 3.2(L)  AST 15 - 41 U/L '23 15 21  ' ALT 0 - 44 U/L '12 13 11  ' Alk Phosphatase 38 - 126 U/L 55 45 58  Total Bilirubin 0.3 - 1.2 mg/dL 0.4 0.4 0.7  Bilirubin, Direct 0.1 - 0.5 mg/dL - - -   RVP: COVID POSITIVE, INFLUENZA A/B  NEG Lactic acid 1.8 Troponin: 21>>24 Urine dipstick shows positive for protein.   CXR: no effusion or infiltrate CTH w/o: no acute findings. Chronic microvascular disease and chronic meningioma CTA chest: no PE or other acute intrathoracic process. Minimally increased size of 4.6cm ascending thoracic aortic aneurysm, previously 4.4cm in 2015. Unchanged bilateral adrenal adenomas and cholelithiasis. Summary  88 yo chronically ill male with moderate-severe vascular dementia and low functional status who was brought to the ED via EMS for mental status changes and was found to have COVID 19. He was admitted for management of acute hypoxic respiratory failure.    Assessment & Plan:  Principal Problem:   COVID-19 Active Problems:   Aortic aneurysm (HCC)   Bilateral adrenal adenomas   Cholelithiasis   Acute respiratory failure ([place])  Acute hypoxic respiratory failure due to COVID 19 pneumonia Received Pfizer vaccination x2 in 2021. Not boosted.  Family had noted mental status change over the past few days and were concerned for UTI. UA is unremarkable. Suspect mentation changes are attributable to known infection with COVID 19.  Mild thrombocytopenia likely attributable to acute viral illness Start dexamethasone  If respiratory status declines, can consider addition of remdesivir Follow urine culture Wean oxygen as able Note: Pt is DNR/DNI  Mild hypercalcemia. Likely dehydration related. Can consider additional workup if hypercalcemia persists after fluid repletion Gentle IVF x12h  Chronic mild troponin elevation. 21>>24. Trop 40 one year ago. No significant ischemic changes on EKG.   CKD 3b. Chronic and stable. Continue to monitor  Chronic hypertension, HFpEF. Chronic and stable. Continue amlodipine.  Thoracic Aortic Aneurysm. Mildly increased in size 4.4>>4.6cm since 2015. Semiannual  imaging surveillance recommended.   Vascular dementia. Delirium precautions.   BPH s/p TURP (2015). BID bladder scans  Hx of VTE, IVC filter in place.   Incidental findings -Bilateral adrenal adenomas: noted to be unchanged from prior -Cholelithiasis: asymptomatic. No further workup at this time.   Best practice:  CODE STATUS: DNR DVT for prophylaxis: lovenox Social considerations/Family communication: daughter updated with plan for admission and workup thus far. She confirms DNR status. Dispo: Admit patient to Inpatient with expected length of stay greater than 2 midnights.   Mitzi Hansen, MD Internal Medicine Resident PGY-3 [facility] Pager: [phone] 04/26/2021 1:49 PM

## 2021-04-27 ENCOUNTER — Telehealth: Payer: Self-pay

## 2021-04-27 ENCOUNTER — Other Ambulatory Visit: Payer: Self-pay

## 2021-04-27 ENCOUNTER — Other Ambulatory Visit: Payer: Self-pay | Admitting: Internal Medicine

## 2021-04-27 DIAGNOSIS — J9601 Acute respiratory failure with hypoxia: Secondary | ICD-10-CM

## 2021-04-27 DIAGNOSIS — U071 COVID-19: Principal | ICD-10-CM

## 2021-04-27 LAB — CBC WITH DIFFERENTIAL/PLATELET
Abs Immature Granulocytes: 0.02 10*3/uL (ref 0.00–0.07)
Basophils Absolute: 0 10*3/uL (ref 0.0–0.1)
Basophils Relative: 0 %
Eosinophils Absolute: 0 10*3/uL (ref 0.0–0.5)
Eosinophils Relative: 0 %
HCT: 34.1 % — ABNORMAL LOW (ref 39.0–52.0)
Hemoglobin: 10.5 g/dL — ABNORMAL LOW (ref 13.0–17.0)
Immature Granulocytes: 1 %
Lymphocytes Relative: 20 %
Lymphs Abs: 0.8 10*3/uL (ref 0.7–4.0)
MCH: 27.9 pg (ref 26.0–34.0)
MCHC: 30.8 g/dL (ref 30.0–36.0)
MCV: 90.7 fL (ref 80.0–100.0)
Monocytes Absolute: 0.4 10*3/uL (ref 0.1–1.0)
Monocytes Relative: 10 %
Neutro Abs: 2.8 10*3/uL (ref 1.7–7.7)
Neutrophils Relative %: 69 %
Platelets: 119 10*3/uL — ABNORMAL LOW (ref 150–400)
RBC: 3.76 MIL/uL — ABNORMAL LOW (ref 4.22–5.81)
RDW: 15.6 % — ABNORMAL HIGH (ref 11.5–15.5)
WBC: 4 10*3/uL (ref 4.0–10.5)
nRBC: 0 % (ref 0.0–0.2)

## 2021-04-27 LAB — BASIC METABOLIC PANEL
Anion gap: 7 (ref 5–15)
BUN: 19 mg/dL (ref 8–23)
CO2: 25 mmol/L (ref 22–32)
Calcium: 10.1 mg/dL (ref 8.9–10.3)
Chloride: 110 mmol/L (ref 98–111)
Creatinine, Ser: 1.25 mg/dL — ABNORMAL HIGH (ref 0.61–1.24)
GFR, Estimated: 53 mL/min — ABNORMAL LOW (ref >60)
Glucose, Bld: 108 mg/dL — ABNORMAL HIGH (ref 70–99)
Potassium: 4.5 mmol/L (ref 3.5–5.1)
Sodium: 142 mmol/L (ref 135–145)

## 2021-04-27 LAB — URINE CULTURE: Culture: NO GROWTH

## 2021-04-27 MED ORDER — AMLODIPINE BESYLATE 10 MG PO TABS: 10.0000 mg | ORAL_TABLET | Freq: Every morning | ORAL | 1 refills | Status: DC

## 2021-04-27 NOTE — Evaluation (Signed)
Physical Therapy Evaluation Patient Details Name: [name] MRN: [id number] DOB: September 12, 1924 Today's Date: 04/27/2021  History of Present Illness  The pt is a 88 yo male presenting 2/14 with AMS and malodorus urine. Upon work up, COVID test positive, all other testing unremarkable. PMH: CVA, HLD, CKD, Covid, HTN, Meningioma, Vitreous hemorrhage; frequent falls.   Clinical Impression  Pt in bed upon arrival of PT, agreeable to evaluation at this time. Prior to admission the pt was living at home with his daughter who he reports assists him with mobility and ADLs. The pt now presents with limitations in functional mobility, power, strength, stability, and activity tolerance, but was able to complete short bout of ambulation in the room with minA to steady, RW, and RA with SpO2 > 95%. The pt demos significant limitations in LE strength and power, needing modA to boost up to standing, or modA to stabilize RW while the pt pulls on the device and braces BLE on the bed to stand. Based on his familiarity with this technique, suspect this is how he completes sit-stand transfers at baseline. The pt also reports his ambulation feels close to normal for him at this time. Given his strength deficits and hx of falling, I do recommend follow up PT to progress strength and improve balance, but the pt is safe to return home with prior DME and assist from his daughter who he states is home with him 24/7.     SpO2 on RA at rest: 98% SpO2 on RA with ambulation: >95%   Recommendations for follow up therapy are one component of a multi-disciplinary discharge planning process, led by the attending physician.  Recommendations may be updated based on patient status, additional functional criteria and insurance authorization.  Follow Up Recommendations Home health PT    Assistance Recommended at Discharge Frequent or constant Supervision/Assistance  Patient can return home with the following  A lot of help with  walking and/or transfers;A lot of help with bathing/dressing/bathroom;Assistance with cooking/housework;Direct supervision/assist for medications management;Assist for transportation;Help with stairs or ramp for entrance    Equipment Recommendations None recommended by PT  Recommendations for Other Services       Functional Status Assessment Patient has had a recent decline in their functional status and demonstrates the ability to make significant improvements in function in a reasonable and predictable amount of time.     Precautions / Restrictions Precautions Precautions: Fall Precaution Comments: COVID Restrictions Weight Bearing Restrictions: No      Mobility  Bed Mobility Overal bed mobility: Needs Assistance Bed Mobility: Supine to Sit, Sit to Supine     Supine to sit: Min assist, HOB elevated Sit to supine: Min assist   General bed mobility comments: minA with pt using bed rails to reposition. max cues and tactile cues    Transfers Overall transfer level: Needs assistance Equipment used: Rolling walker (2 wheels) Transfers: Sit to/from Stand Sit to Stand: Mod assist           General transfer comment: modA either to pt or to stabilize RW (pt pulling on RW) pt with strong posterior lean and bracing of BLE on bed to stand    Ambulation/Gait Ambulation/Gait assistance: Min assist Gait Distance (Feet): 30 Feet Assistive device: Rolling walker (2 wheels) Gait Pattern/deviations: Step-to pattern, Decreased stride length, Ataxic, Narrow base of support Gait velocity: decreased Gait velocity interpretation: <1.31 ft/sec, indicative of household ambulator   General Gait Details: pt with slightly inconsistent RLE step length, width, and clearance.  heavy dependence on BUE support, minA to steady. VSS on RA with SpO2 > 95% with gait     Balance Overall balance assessment: Needs assistance Sitting-balance support: No upper extremity supported, Feet  supported Sitting balance-Leahy Scale: Fair Sitting balance - Comments: pt able to lean slightly outside BOS Postural control: Posterior lean Standing balance support: Bilateral upper extremity supported, Reliant on assistive device for balance Standing balance-Leahy Scale: Poor Standing balance comment: modA to stand and minA to ambulate with BUE support on RW                             Pertinent Vitals/Pain Pain Assessment Pain Assessment: No/denies pain    Home Living Family/patient expects to be discharged to:: Private residence Living Arrangements: Other relatives Available Help at Discharge: Available 24 hours/day Type of Home: House Home Access: Ramped entrance       Home Layout: One level Home Equipment: Conservation officer, nature (2 wheels);Wheelchair - manual;BSC/3in1;Shower seat;Grab bars - tub/shower      Prior Function Prior Level of Function : Needs assist       Physical Assist : Mobility (physical);ADLs (physical) Mobility (physical): Transfers;Gait ADLs (physical): Bathing;Dressing;Toileting;IADLs   ADLs Comments: daughter helps for safety; pt has fear of falling at baseline     Hand Dominance   Dominant Hand: Right    Extremity/Trunk Assessment   Upper Extremity Assessment Upper Extremity Assessment: Generalized weakness    Lower Extremity Assessment Lower Extremity Assessment: Generalized weakness;RLE deficits/detail (grossly functional against gravity, poor strength and power) RLE Deficits / Details: decreased corrdination and ataxic steps RLE Sensation: WNL RLE Coordination: decreased fine motor;decreased gross motor    Cervical / Trunk Assessment Cervical / Trunk Assessment: Kyphotic  Communication   Communication: HOH  Cognition Arousal/Alertness: Awake/alert Behavior During Therapy: WFL for tasks assessed/performed Overall Cognitive Status: Difficult to assess                                 General Comments:  following simple cues, but even with loud voice to hearing aide side, pt at times responding incorrectly to questions such as: "do you take showers?" and pt responding "okay"        General Comments General comments (skin integrity, edema, etc.): VSS on RA    Exercises     Assessment/Plan    PT Assessment Patient needs continued PT services  PT Problem List Decreased strength;Decreased range of motion;Decreased activity tolerance;Decreased balance;Decreased mobility;Decreased coordination;Decreased safety awareness       PT Treatment Interventions DME instruction;Gait training;Stair training;Functional mobility training;Therapeutic activities;Therapeutic exercise;Balance training;Patient/family education    PT Goals (Current goals can be found in the Care Plan section)  Acute Rehab PT Goals Patient Stated Goal: return home PT Goal Formulation: With patient Time For Goal Achievement: 05/11/21 Potential to Achieve Goals: Fair    Frequency Min 3X/week    AM-PAC PT "6 Clicks" Mobility  Outcome Measure Help needed turning from your back to your side while in a flat bed without using bedrails?: A Little Help needed moving from lying on your back to sitting on the side of a flat bed without using bedrails?: A Lot Help needed moving to and from a bed to a chair (including a wheelchair)?: A Lot Help needed standing up from a chair using your arms (e.g., wheelchair or bedside chair)?: A Lot Help needed to walk in hospital room?: A Little  Help needed climbing 3-5 steps with a railing? : A Lot 6 Click Score: 14    End of Session Equipment Utilized During Treatment: Gait belt Activity Tolerance: Patient tolerated treatment well Patient left: in bed;with call bell/phone within reach;with bed alarm set Nurse Communication: Mobility status PT Visit Diagnosis: Other abnormalities of gait and mobility (R26.89);Muscle weakness (generalized) (M62.81);Repeated falls (R29.6);Ataxic gait  (R26.0)    Time: 4604-7998 PT Time Calculation (min) (ACUTE ONLY): 39 min   Charges:   PT Evaluation $PT Eval Moderate Complexity: 1 Mod PT Treatments $Therapeutic Exercise: 8-22 mins $Therapeutic Activity: 8-22 mins        West Carbo, PT, DPT   Acute Rehabilitation Department Pager #: [phone]  Sandra Cockayne 04/27/2021, 10:09 AM

## 2021-04-27 NOTE — TOC Progression Note (Signed)
Transition of Care [facility]) - Progression Note    Patient Details  Name: [name] MRN: [id number] Date of Birth: August 22, 1924  Transition of Care [facility]) CM/SW Contact  Zenon Mayo, RN Phone Number: 04/27/2021, 8:57 AM  Clinical Narrative:    From home with daughter,admitted with COVID -77, Aortic aneursm, bil adrenal adenomas, cholelithiaisis, acute resp failure. TOC will continue to follow for dc needs.         Expected Discharge Plan and Services                                                 Social Determinants of Health (SDOH) Interventions    Readmission Risk Interventions No flowsheet data found.

## 2021-04-27 NOTE — TOC Transition Note (Addendum)
Transition of Care [facility]) - CM/SW Discharge Note   Patient Details  Name: PASTOR SGRO MRN: [id number] Date of Birth: [birth year]  Transition of [facility]) CM/SW Contact:  Zenon Mayo, RN Phone Number: 04/27/2021, 10:51 AM   Clinical Narrative:    NCM spoke with daughter , who is the caregiver for patient, she states she has COVID as well.  Patient has a cane, walker, bsc, hosp bed and a w/chair at home.  He has PCP.  She will transport him home at discharge.  NCM offered choice for [facility] , daughter chose [facility].  NCM made referral to [facility] with [facility].  Awaiting to hear back. Corene Cornea can not take referral due to staffing, NCM spoke with daughter ,she states she forgot they are with Jackquline [place],  NCM made referral to [facility] with [facility] for [place].  She is able to take referral .  Soc will begin 24 to 48 hrs post dc.    Final next level of care: [place] Barriers to Discharge: No Barriers Identified   Patient Goals and CMS Choice Patient states their goals for this hospitalization and ongoing recovery are:: home CMS Medicare.gov Compare Post Acute Care list provided to:: Patient Represenative (must comment) Choice offered to / list presented to : Adult Children  Discharge Placement                       Discharge Plan and Services   Discharge Planning Services: CM Consult Post Acute Care Choice: Home Health            DME Agency: NA       HH Arranged: PT [place] Date [place] Agency Contacted: 04/27/21 Time HH Agency Contacted: 62 Representative spoke with at [place] ([place]) Interventions     Readmission Risk Interventions No flowsheet data found.

## 2021-04-27 NOTE — Telephone Encounter (Signed)
Needs prescription for Amlodipine 10 mg sent to pharmacy

## 2021-04-27 NOTE — Discharge Summary (Signed)
Name: [name] MRN: [id number] DOB: Dec 06, 1924 88 y.o. PCP: [name], DO  Date of Admission: 04/26/2021 12:14 AM Date of Discharge:  04/27/2021 Attending Physician: Dr.  Cain Sieve  DISCHARGE DIAGNOSIS:  Primary Problem: COVID-19   Hospital Problems: Principal Problem:   COVID-19 Active Problems:   Aortic aneurysm (HCC)   Bilateral adrenal adenomas   Cholelithiasis   Acute respiratory failure ([place])    DISCHARGE MEDICATIONS:   Allergies as of 04/27/2021   No Known Allergies      Medication List     TAKE these medications    amLODipine 10 MG tablet Commonly known as: NORVASC Take 1 tablet (10 mg total) by mouth every morning. Start taking on: April 28, 2021 What changed:  medication strength how much to take when to take this   aspirin 81 MG EC tablet Take 1 tablet (81 mg total) by mouth daily.   atorvastatin 20 MG tablet Commonly known as: LIPITOR Take 1 tablet (20 mg total) by mouth daily.   furosemide 40 MG tablet Commonly known as: LASIX Take 1 tablet (40 mg total) by mouth daily as needed for fluid.   triamcinolone ointment 0.1 % Commonly known as: KENALOG Apply 1 application topically 2 (two) times daily as needed (For leg rash).        DISPOSITION AND FOLLOW-UP:  [name] is a 88 year old man with vascular dementia who presented with acute and progressive confusion found to be hypoxic requiring supplemental oxygen and incidental COVID-19 infection who received 1 dose of Decadron 6 mg and supplemental oxygen with successful wean to room air prior to discharge.  [name] was discharged from [facility] in Stable condition. At the hospital follow up visit please address:  Acute hypoxic respiratory failure due to COVID 19 pneumonia Monitor for continued resolution of hypoxia secondary to COVID-19 infection.   Mild hypercalcemia Recheck BMP to assess continued resolution of hypercalcemia   Thoracic  Aortic Aneurysm Semiannual CT chest for surveillance recommended.  Please engage in goals of care conversation regarding this recommendation with the patient and his family.   Incidental findings of bilateral adrenal adenomas noted to be unchanged from prior, and cholelithiasis with no further work-up indicated.  Follow-up Recommendations: Consults: None Labs: Basic Metabolic Profile and CBC Studies: CT chest semiannually. Medications: No medication dosing changes were made however the patient's daughter reported he is taking amlodipine 10 mg daily instead of 5 mg daily so I sent in an updated prescription at discharge.  Follow-up Appointments:  Follow-up Information     [name], DO. Go on 05/10/2021.   Specialty: Internal Medicine Why: @8 :45am Contact information: [place] [address] [phone]         [name], DO Follow up in 1 week(s).   Specialty: Internal Medicine Why: Our clinic will call you to schedule this visit. Contact information: [place] [address] [phone]                 HOSPITAL COURSE:  Patient Summary: Acute hypoxic respiratory failure due to COVID 19 pneumonia Patient brought in by EMS for 2 days of worsening confusion from baseline of dementia per family.  Family was also concerned about urinary changes, increased weakness.  In the ED he was mildly hypoxic requiring 1 to 2 L [place] and found COVID-positive.  UA was inconsistent with UTI, and patient had no further electrolyte derangements or changes in renal  function from baseline.  He was without leukocytosis, lactate was normal and did have mild thrombocytopenia.  Chest x-ray did not show infiltrate or effusion and CTA chest did not show pulmonary embolus.  CT head did not show acute changes.  He was started on Decadron at admission.  He was continued on oxygen supplementation with [place] overnight with appropriate saturations.  Urine culture  showed no growth.  Patient was weaned from oxygen supplementation with appropriate oxygen saturations prior to discharge.   Mild hypercalcemia Calcium of 10.6 on admission, thought to be related to dehydration.  He received gentle hydration with LR 75 mL/h.  This did improve to 10.1 on day of discharge.   Chronic mild troponin elevation Initial troponin of 21 with repeat of 24.  EKG obtained with no significant ischemic changes.    CKD stage 3b  Chronic condition.   Hypertension HFpEF.  Chronic conditions.  Continued home amlodipine 10 mg daily.   Thoracic Aortic Aneurysm Previously documented as 4.4 cm.  CT chest revealed mild increase in size to 4.6 cm since 2015.   Vascular dementia.  BPH Chronic conditions.   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    Incidental findings -Bilateral adrenal adenomas: noted to be unchanged from prior -Cholelithiasis: asymptomatic. No further workup at this time.    DISCHARGE INSTRUCTIONS:   Discharge Instructions     Call MD for:  difficulty breathing, headache or visual disturbances   Complete by: As directed    Call MD for:  persistant dizziness or light-headedness   Complete by: As directed    Call MD for:  persistant nausea and vomiting   Complete by: As directed    Call MD for:  temperature >100.4   Complete by: As directed    Diet - low sodium heart healthy   Complete by: As directed    Discharge instructions   Complete by: As directed    [name],  It was a pleasure caring for you while you were hospitalized at [facility].  When you first arrived to the hospital you are having difficulty maintaining safe oxygen levels in your blood.  We gave you a steroid treatment and oxygen which helped your blood oxygen levels tremendously.  I recommend that you follow-up with your primary care provider in the next week or so to make sure your oxygen levels are still at a safe level.  Do not hesitate to return for reevaluation in the  emergency department if you begin feeling short of breath or like you are having trouble getting enough oxygen again.  My best, Dr. Marlou Sa   Increase activity slowly   Complete by: As directed        SUBJECTIVE:  Patient evaluated bedside on day of discharge.  He is resting comfortably bed on room air and enjoying his breakfast.  He states that he is ready to go home.  Discharge Vitals:   BP (!) 149/90 (BP Location: Right Arm)    Pulse 76    Temp (!) 97.5 F (36.4 C) (Oral)    Resp 17    Ht 6' (1.829 m)    Wt 81.4 kg    SpO2 96%    BMI 24.34 kg/m   OBJECTIVE:  Constitutional: Elderly gentleman resting comfortably in bed.  No acute distress noted. Cardio: Regular rate and rhythm.  No murmurs, rubs, gallops. Pulm: Clear to auscultation bilaterally.  Normal work of breathing on room air. Abdomen: Soft, nontender, nondistended. MSK: Negative for  extremity edema. Skin: Warm and dry. Neuro: Awake and alert.  No focal deficit noted. Psych: Pleasantly confused.  Pertinent Labs, Studies, and Procedures:  CBC Latest Ref Rng & Units 04/27/2021 04/26/2021 02/15/2021  WBC 4.0 - 10.5 K/uL 4.0 6.7 6.0  Hemoglobin 13.0 - 17.0 g/dL 10.5(L) 11.9(L) 9.8(L)  Hematocrit 39.0 - 52.0 % 34.1(L) 38.5(L) 32.6(L)  Platelets 150 - 400 K/uL 119(L) 143(L) 162    CMP Latest Ref Rng & Units 04/27/2021 04/26/2021 02/15/2021  Glucose 70 - 99 mg/dL 108(H) 154(H) 100(H)  BUN 8 - 23 mg/dL 19 23 17   Creatinine 0.61 - 1.24 mg/dL 1.25(H) 1.38(H) 1.27(H)  Sodium 135 - 145 mmol/L 142 143 139  Potassium 3.5 - 5.1 mmol/L 4.5 4.2 3.6  Chloride 98 - 111 mmol/L 110 110 107  CO2 22 - 32 mmol/L 25 25 25   Calcium 8.9 - 10.3 mg/dL 10.1 10.6(H) 9.7  Total Protein 6.5 - 8.1 g/dL - 7.1 -  Total Bilirubin 0.3 - 1.2 mg/dL - 0.4 -  Alkaline Phos 38 - 126 U/L - 55 -  AST 15 - 41 U/L - 23 -  ALT 0 - 44 U/L - 12 -    CT HEAD WO CONTRAST (5MM)  Result Date: 04/26/2021 CLINICAL DATA:  Mental status change, unknown cause. EXAM: CT  HEAD WITHOUT CONTRAST TECHNIQUE: Contiguous axial images were obtained from the base of the skull through the vertex without intravenous contrast. RADIATION DOSE REDUCTION: This exam was performed according to the departmental dose-optimization program which includes automated exposure control, adjustment of the mA and/or kV according to patient size and/or use of iterative reconstruction technique. COMPARISON:  02/12/2021. FINDINGS: Brain: No acute intracranial hemorrhage, midline shift or mass effect. Diffuse atrophy is noted. Subcortical and periventricular white matter hypodensities are noted bilaterally. There is an old infarct in the frontal lobe on the right. Old infarcts are noted in the cerebellar hemispheres and occipital lobes bilaterally. Old infarcts are present in the basal ganglia bilaterally. There is an extra-axial hyperdense lesion with calcifications along the floor of the frontal bone on the left measuring 2.7 cm, compatible with hemangioma. No hydrocephalus. Vascular: No hyperdense vessel or unexpected calcification. Skull: Normal. Negative for fracture or focal lesion. Sinuses/Orbits: No acute finding. Other: None. IMPRESSION: 1. No acute intracranial process. 2. Atrophy with extensive chronic microvascular ischemic changes and multifocal infarcts. 3. Anterior cranial fossa meningioma on the left, unchanged. Electronically Signed   By: Brett Fairy M.D.   On: 04/26/2021 02:00   CT Angio Chest Pulmonary Embolism (PE) W or WO Contrast  Result Date: 04/26/2021 CLINICAL DATA:  Altered mental status.  COVID positive. EXAM: CT ANGIOGRAPHY CHEST WITH CONTRAST TECHNIQUE: Multidetector CT imaging of the chest was performed using the standard protocol during bolus administration of intravenous contrast. Multiplanar CT image reconstructions and MIPs were obtained to evaluate the vascular anatomy. RADIATION DOSE REDUCTION: This exam was performed according to the departmental dose-optimization program  which includes automated exposure control, adjustment of the mA and/or kV according to patient size and/or use of iterative reconstruction technique. CONTRAST:  74mL OMNIPAQUE IOHEXOL 350 MG/ML SOLN COMPARISON:  Chest x-ray from same day. CTA chest dated August 21, 2013. FINDINGS: Cardiovascular: Satisfactory opacification of the pulmonary arteries to the segmental level. No evidence of pulmonary embolism. Unchanged mild cardiomegaly. No pericardial effusion. Minimally increased ascending thoracic aortic aneurysm currently measuring 4.6 cm, previously 4.4 cm in 2015. Mediastinum/Nodes: No enlarged mediastinal, hilar, or axillary lymph nodes. Thyroid gland, trachea, and esophagus demonstrate no  significant findings. Lungs/Pleura: Mild centrilobular emphysema and minimal dependent atelectasis in both lungs. No focal consolidation, pleural effusion, or pneumothorax. 5 x 3 mm ground-glass nodule in the right middle lobe (series 7, image 75), previously 4 x 3 mm in 2015, benign. No follow-up required. Upper Abdomen: No acute abnormality. Multiple hepatic and bilateral renal cysts again noted. Unchanged bilateral adrenal adenomas and multiple small layering gallstones. Musculoskeletal: No chest wall abnormality. No acute or significant osseous findings. Review of the MIP images confirms the above findings. IMPRESSION: 1. No evidence of pulmonary embolism. No acute intrathoracic process. 2. Minimally increased size of 4.6 cm ascending thoracic aortic aneurysm, previously 4.4 cm in 2015. Recommend semi-annual imaging followup by CTA or MRA and referral to cardiothoracic surgery if not already obtained. This recommendation follows 2010 ACCF/AHA/AATS/ACR/ASA/SCA/SCAI/SIR/STS/SVM Guidelines for the Diagnosis and Management of Patients With Thoracic Aortic Disease. Circulation. 2010; 121: H741-U384. Aortic aneurysm NOS (ICD10-I71.9) 3. Unchanged bilateral adrenal adenomas and cholelithiasis. Electronically Signed   By: Titus Dubin M.D.   On: 04/26/2021 08:55   DG Chest Port 1 View  Result Date: 04/26/2021 CLINICAL DATA:  Mental status change EXAM: PORTABLE CHEST 1 VIEW COMPARISON:  02/12/2021 FINDINGS: The heart size and mediastinal contours are within normal limits. Both lungs are clear. The visualized skeletal structures are unremarkable. IMPRESSION: No active disease. Electronically Signed   By: Ulyses Jarred M.D.   On: 04/26/2021 01:01     Signed: Farrel Gordon, D.O.  Internal Medicine Resident, PGY-1 [facility]  Pager: [phone] 11:11 AM, 04/27/2021

## 2021-04-27 NOTE — Progress Notes (Addendum)
Called patient's daughter [name].  Daughter made aware of patient's discharge today.  Discharge instructions given to the daughter over the phone, verbalized understanding.  Daughter will call the unit when she is on her way.

## 2021-04-27 NOTE — Progress Notes (Signed)
Daughter [name] will be at the discharge area at 3pm.

## 2021-04-27 NOTE — Telephone Encounter (Signed)
RN from [address] called requested a HFU for pt being discharged today was given 2/28 with Dr Raymondo Band @8 :73

## 2021-04-27 NOTE — Hospital Course (Addendum)
Acute hypoxic respiratory failure due to COVID 19 pneumonia Patient brought in by EMS for 2 days of worsening confusion from baseline of dementia per family.  Family was also concerned about urinary changes, increased weakness.  In the ED he was mildly hypoxic requiring 1 to 2 L [place] and found COVID-positive.  UA was inconsistent with UTI, and patient had no further electrolyte derangements or changes in renal function from baseline.  He was without leukocytosis, lactate was normal and did have mild thrombocytopenia.  Chest x-ray did not show infiltrate or effusion and CTA chest did not show pulmonary embolus.  CT head did not show acute changes.  He was started on Decadron at admission.  He was continued on oxygen supplementation with [place] overnight with appropriate saturations.  Urine culture showed no growth.  Patient was weaned from oxygen supplementation with appropriate oxygen saturations prior to discharge.   Mild hypercalcemia Calcium of 10.6 on admission, thought to be related to dehydration.  He received gentle hydration with LR 75 mL/h.  This did improve to 10.1 on day of discharge.   Chronic mild troponin elevation Initial troponin of 21 with repeat of 24.  EKG obtained with no significant ischemic changes.    CKD stage 3b  Chronic condition.   Hypertension HFpEF.  Chronic conditions.  Continued home amlodipine 10 mg daily.   Thoracic Aortic Aneurysm Previously documented as 4.4 cm.  CT chest revealed mild increase in size to 4.6 cm since 2015.   Vascular dementia.  BPH Chronic conditions.   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    Incidental findings -Bilateral adrenal adenomas: noted to be unchanged from prior -Cholelithiasis: asymptomatic. No further workup at this time.

## 2021-04-28 NOTE — Telephone Encounter (Signed)
Appears amlodipine was changed by inpatient team to 10mg  daily instead of 5mg  BID (our previous dosing) I changed this back to our previous dosing which allowed for rochelle to skip a dose if BP was fine.

## 2021-04-29 ENCOUNTER — Telehealth: Payer: Self-pay | Admitting: Internal Medicine

## 2021-04-29 NOTE — Telephone Encounter (Signed)
-----   Message from [name], DO sent at 04/28/2021  4:59 PM EST ----- Regarding: Hospital follow up Could someone cancel his 2/28 appointment and reschedule him with me on 3/2 would be fine to overbook (one of the later appointments after 10:30)

## 2021-04-29 NOTE — Telephone Encounter (Signed)
Please refer to message below.  Just spoke with patient's daughter, [name].  She is aware that the appointment on 05/10/2021 has been canceled and moved to 05/12/21 with Dr. Heber [place].  She requested that an appointment card be mailed to her.

## 2021-05-05 ENCOUNTER — Telehealth: Payer: Self-pay

## 2021-05-05 NOTE — Telephone Encounter (Signed)
RT call from Will.  States patient through his daughter is leaning to 1 side . Altered Mental Status.  No fever to report at this time.  Believes patient may have a UTI.   Will HH from [facility] will go out tomorrow and get a urine specimen the patient.  Is prepared to do an in and out Cath if needed to obtain a urine sample from the patient.  Message to be sent to PCP for order to do.

## 2021-05-05 NOTE — Telephone Encounter (Signed)
Agree, I&O cath, UA with reflex microscopy and Urine culture

## 2021-05-05 NOTE — Telephone Encounter (Signed)
Will with [facility] requesting VO for urinalysis. Please call back.

## 2021-05-05 NOTE — Telephone Encounter (Signed)
RTC to Will.  Msg left that Clinics had returned his call.

## 2021-05-06 NOTE — Telephone Encounter (Signed)
RTC to [name] asked to include a Urine Reflex Microscopy and Culture with the urine specimen he obtains  from patient.

## 2021-05-10 ENCOUNTER — Encounter: Payer: Medicare HMO | Admitting: Internal Medicine

## 2021-05-10 DIAGNOSIS — I5032 Chronic diastolic (congestive) heart failure: Secondary | ICD-10-CM | POA: Diagnosis not present

## 2021-05-10 DIAGNOSIS — I130 Hypertensive heart and chronic kidney disease with heart failure and stage 1 through stage 4 chronic kidney disease, or unspecified chronic kidney disease: Secondary | ICD-10-CM | POA: Diagnosis not present

## 2021-05-10 DIAGNOSIS — N183 Chronic kidney disease, stage 3 unspecified: Secondary | ICD-10-CM | POA: Diagnosis not present

## 2021-05-12 ENCOUNTER — Encounter: Payer: Self-pay | Admitting: Internal Medicine

## 2021-05-12 ENCOUNTER — Other Ambulatory Visit: Payer: Self-pay

## 2021-05-12 ENCOUNTER — Ambulatory Visit (INDEPENDENT_AMBULATORY_CARE_PROVIDER_SITE_OTHER): Payer: Medicare HMO | Admitting: Internal Medicine

## 2021-05-12 VITALS — BP 135/80 | HR 66 | Temp 98.1°F | Ht 69.0 in | Wt 180.0 lb

## 2021-05-12 DIAGNOSIS — K8020 Calculus of gallbladder without cholecystitis without obstruction: Secondary | ICD-10-CM

## 2021-05-12 DIAGNOSIS — I5032 Chronic diastolic (congestive) heart failure: Secondary | ICD-10-CM

## 2021-05-12 DIAGNOSIS — I10 Essential (primary) hypertension: Secondary | ICD-10-CM

## 2021-05-12 DIAGNOSIS — D329 Benign neoplasm of meninges, unspecified: Secondary | ICD-10-CM

## 2021-05-12 DIAGNOSIS — I110 Hypertensive heart disease with heart failure: Secondary | ICD-10-CM | POA: Diagnosis not present

## 2021-05-12 DIAGNOSIS — I7121 Aneurysm of the ascending aorta, without rupture: Secondary | ICD-10-CM | POA: Diagnosis not present

## 2021-05-12 DIAGNOSIS — R454 Irritability and anger: Secondary | ICD-10-CM | POA: Insufficient documentation

## 2021-05-12 NOTE — Telephone Encounter (Signed)
Call placed to Will with pt's [facility] agency - no answer; left message on self identified vm about getting urine specimen for u/a and cx. ?

## 2021-05-12 NOTE — Assessment & Plan Note (Signed)
A 4.6 cm thoracic aortic aneurysm once noted during the last hospitalization recommendations for semiannual annual follow-up for this were given.  Size changed was from 4.4 in 2015.  I discussed this with [name] in relation to his overall care.  I do not think he would tolerate any sort of aortic repair and intervention on such a major issue should it become further enlarged would be high risk, therefore we agreed no intervention would be completed and no need to obtain semiannual screening. ?

## 2021-05-12 NOTE — Progress Notes (Signed)
?  Subjective:  ?HPI: ?[name] is a 88 y.o. male who presents for HFU for COVID-19 ? ?Overall [name] and his daughter [name] have recovered well after COVID-19 brief hospitalization last month.  [name] is somewhat concerned he may have a UTI he notes that he has been a little bit more irritable over the last few days and occasionally leaning more to the right while seated.  She reports home health nursing was concerned about some constipation she has not been overly is concerned as he does not usually have a bowel movement every day. ? ?Please see Assessment and Plan below for the status of his chronic medical problems. ? ?Objective:  ?Physical Exam: ?Vitals:  ? 05/12/21 1120  ?BP: 135/80  ?Pulse: 66  ?Temp: 98.1 ?F (36.7 ?C)  ?TempSrc: Oral  ?SpO2: 100%  ?Weight: 180 lb (81.6 kg)  ?Height: 5\' 9"  (1.753 m)  ? ?Body mass index is 26.58 kg/m?Marland Kitchen ?Physical Exam ?Vitals and nursing note reviewed.  ?Constitutional:   ?   Appearance: Normal appearance.  ?   Comments: Sitting in transport wheelchair, [name].  ?Cardiovascular:  ?   Rate and Rhythm: Normal rate and regular rhythm.  ?Pulmonary:  ?   Effort: Pulmonary effort is normal.  ?   Breath sounds: Normal breath sounds.  ?Abdominal:  ?   General: Abdomen is flat.  ?   Palpations: Abdomen is soft.  ?Neurological:  ?   Mental Status: He is alert. Mental status is at baseline.  ? ?Assessment & Plan:  ?See Encounters Tab for problem based charting. ? ?Medications Ordered ?No orders of the defined types were placed in this encounter. ? ?Other Orders ?Orders Placed This Encounter  ?Procedures  ? Culture, Urine  ?  Standing Status:   Future  ?  Standing Expiration Date:   05/13/2022  ? Urinalysis, Reflex Microscopic  ?  Standing Status:   Future  ?  Standing Expiration Date:   05/13/2022  ? ?Follow Up: ?Return in about 3 months (around 08/12/2021). ? ?

## 2021-05-12 NOTE — Assessment & Plan Note (Signed)
Blood pressure control is excellent today we will continue amlodipine 5 mg 1-2 times a day dosed by [facility] ?

## 2021-05-12 NOTE — Assessment & Plan Note (Signed)
CT of the head from 04/26/2021 reviewed meningioma is unchanged in size. ?

## 2021-05-12 NOTE — Assessment & Plan Note (Signed)
Per daughter report patient has been slightly increased in his irritability this is the way some UTIs have presented in the past given his dementia.  Will check UA and urine culture. ?

## 2021-05-12 NOTE — Assessment & Plan Note (Signed)
He appears grossly euvolemic by my exam with the exception of some chronic bilateral pedal edema.  Overall is stable no changes in medications. ?

## 2021-05-12 NOTE — Assessment & Plan Note (Signed)
Cholelithiasis without cholecystitis was noted on imaging he remains asymptomatic. ?

## 2021-05-12 NOTE — Telephone Encounter (Signed)
Return call from [facility], [facility], stated he will see pt to collect urine specimen for U/A and cx and send results to Dr Heber [place]. ?

## 2021-05-17 ENCOUNTER — Telehealth: Payer: Self-pay | Admitting: *Deleted

## 2021-05-17 NOTE — Telephone Encounter (Signed)
Call from pt's daughter - stated since pt has been home from the hospital HHPT has not been restarted.  Stated she had called [facility] who told her to get in touch with his doctor. I told I will ask Dr Heber [place] to place new referral order for home PT and pt's daughter also asked about having a nurse to see pt (Nurse eval). ?Chilon stated since pt was seen last week to attach referral order to that visit. ?Thanks ?

## 2021-05-19 DIAGNOSIS — N390 Urinary tract infection, site not specified: Secondary | ICD-10-CM | POA: Diagnosis not present

## 2021-05-23 NOTE — Addendum Note (Signed)
Addended by: [name] on: 05/23/2021 03:34 PM ? ? Modules accepted: Orders ? ?

## 2021-05-23 NOTE — Telephone Encounter (Signed)
Added to 3/2 visit ?

## 2021-05-30 ENCOUNTER — Telehealth: Payer: Self-pay | Admitting: Internal Medicine

## 2021-05-30 NOTE — Telephone Encounter (Signed)
[facility] REQUESTING VO. ? ?[facility] ? ?Call back number [phone] ?

## 2021-05-30 NOTE — Telephone Encounter (Signed)
Agree with verbal order 

## 2021-05-30 NOTE — Telephone Encounter (Signed)
Return call to [facility], [place]. Stated eval was done today. Requesting verbal orders to "Continue PT for once a week x 1 week then twice a week x 4 weeks; then once a week x 4 weeks". To work on gait training, balance, transfers, and leg strengthening. ?VO given - if not appropriate, let me know. ?Thanks ?

## 2021-05-31 NOTE — Telephone Encounter (Signed)
Call from Willis Modena from [facility].  Given ok per Dr. Heber Wilson  for Verbal orders requested yesterday.  ?

## 2021-06-02 ENCOUNTER — Telehealth: Payer: Self-pay

## 2021-06-02 NOTE — Telephone Encounter (Signed)
Called pt no answer-LVM for pt to call back regarding placard. Disability parking placard is ready for pick up. ?

## 2021-06-10 DIAGNOSIS — I5032 Chronic diastolic (congestive) heart failure: Secondary | ICD-10-CM | POA: Diagnosis not present

## 2021-06-10 DIAGNOSIS — N183 Chronic kidney disease, stage 3 unspecified: Secondary | ICD-10-CM | POA: Diagnosis not present

## 2021-06-10 DIAGNOSIS — I130 Hypertensive heart and chronic kidney disease with heart failure and stage 1 through stage 4 chronic kidney disease, or unspecified chronic kidney disease: Secondary | ICD-10-CM | POA: Diagnosis not present

## 2021-07-14 NOTE — Addendum Note (Signed)
Addended by: Joni Reining C on: 07/14/2021 10:53 AM ? ? Modules accepted: Orders ? ?

## 2021-07-21 ENCOUNTER — Ambulatory Visit (INDEPENDENT_AMBULATORY_CARE_PROVIDER_SITE_OTHER): Payer: Medicare HMO | Admitting: Internal Medicine

## 2021-07-21 ENCOUNTER — Encounter: Payer: Self-pay | Admitting: Internal Medicine

## 2021-07-21 VITALS — BP 142/89 | HR 70 | Wt 182.9 lb

## 2021-07-21 DIAGNOSIS — I10 Essential (primary) hypertension: Secondary | ICD-10-CM

## 2021-07-21 DIAGNOSIS — Z5989 Other problems related to housing and economic circumstances: Secondary | ICD-10-CM

## 2021-07-21 DIAGNOSIS — H6122 Impacted cerumen, left ear: Secondary | ICD-10-CM

## 2021-07-21 NOTE — Assessment & Plan Note (Signed)
BP reasonably well controlled today on recheck, initially above 150, on recheck down to the 140s. Given his age I think this is an adequate control for his BP however will continue to monitor at next visit. He is taking amlodipine daily (not twice daily) ?- continue amlodipine 5 daily ?

## 2021-07-21 NOTE — Patient Instructions (Addendum)
[name] ? ?It was a pleasure seeing you in the clinic today.  ? ?We cleaned out your ears and removed a lot of the wax. You still have a lot of wax left on the left side. I want you to continue with the ear drops and keep your head flat and let the drops soak in for five minutes. We will see you back in clinic in about a week to try and clear them out again.  ? ?Please call our clinic at [phone] if you have any questions or concerns. The best time to call is Monday-Friday from 9am-4pm, but there is someone available 24/7 at the same number. If you need medication refills, please notify your pharmacy one week in advance and they will send [place] a request. ?  ?Thank you for letting us take part in your care. We look forward to seeing you next time! ? ?

## 2021-07-21 NOTE — Progress Notes (Signed)
? ?  CC: ear wax ? ?HPI: ? ?[name] is a 88 y.o. PMH noted below, who presents to the [facility] with complaints of ear wax. To see the management of his acute and chronic conditions, please refer to the A&P note under the encounters tab.  ? ?Past Medical History:  ?Diagnosis Date  ? Anemia   ? BPH (benign prostatic hypertrophy)   ? TURP 05/19/13  ? Cerebral embolism with cerebral infarction ([place]) 12/19/2013  ? Cerebrovascular accident (CVA) ([place]) 04/17/2019  ? Chronic kidney disease   ? CHRONIC KIDNEY DISEASE, 2  ? COVID-19 03/19/2020  ? 1/7- 03/26/2020 presentation of generalized weakness.  Treated with Decadron. CRP peaked at 2.3 and was 0.6 at discharge.  No D-dimer performed.  ? Difficulty hearing, right   ? BILATERAL HEARING LOSS - BEST TO TRY TO SPEAK INTO LEFT EAR  ? DVT (deep venous thrombosis) (H[facility])   ? Frequent falls   ? History of DVT (deep vein thrombosis) 06/10/2013  ? Provoked s/p TURP. Dx per doppler 06/10/13. Complicated by hematuria while on anticoagulation s/p TURP. Initially on lovenox and coumadin, then stopped, then IVC filter placed 06/16/13. Anticoagulation restarted after hematuria stopped. Lovenox was discontinued apparently on 07/29/13 with comment on high risk for falls.   Total ~1.5 months of anticoagulation but interrupted with bleeding complication.  ? Hyperlipidemia   ? Hypertension   ? Incontinence of urine   ? SOME INCONTINENCE  ? Left adrenal mass ([place]) 09/11/2013  ? Meningioma ([place])   ? Stroke [facility])   ? Cerebellar, 2013; WALKS WITH WALKER, ABLE TO DRESS AND BATHE HIMSELF BUT FAMILY TRIES TO PROVIDE SUPERVISION BECAUSE OF HIS HX OF FALL AND WEAKNESS LEGS, ARMS   ? Thrombocytopenia ([place])   ? Vitreous hemorrhage ([place]) 12/21/2013  ? OS 12/18/13 CT /MRI brain done for ataxia   ? ?Review of Systems:  positive for hearing loss, ear wax  ? ?Physical Exam: ?Gen: elderly man in NAD ?HEENT: normocephalic atraumatic, cerumen impaction bilaterally, cleared after irrigation on the right, cerumen impaction  remaining on the left, hearing loss present ?CV: RRR, no m/r/g   ?Resp: CTAB, normal WOB  ?GI: soft, nontender ?MSK: moves all extremities without difficulty ?Skin:warm and dry ?Neuro:alert answering questions appropriately ?Psych: normal affect ? ? ?Assessment & Plan:  ? ?See Encounters Tab for problem based charting. ? ?Patient discussed with Dr. Saverio Danker  ? ?

## 2021-07-21 NOTE — Assessment & Plan Note (Signed)
Bilateral cerumen impaction on exam. Flushed ear canals with good clearance of ear wax on the right, however the left ear was unable to be cleared. He was getting some irritation with the irrigation.  ?

## 2021-07-22 ENCOUNTER — Telehealth: Payer: Self-pay | Admitting: *Deleted

## 2021-07-22 NOTE — Telephone Encounter (Signed)
Received call from Anda Kraft PT with [facility] - requesting verbal orders "Continue PT twice a week x 5 weeks; then once a week x4 weeks". She stated he's doing well, walking more and seems to enjoy it. ?VO given - if not appropriate, let me know. ?Thanks  ? ?

## 2021-07-25 ENCOUNTER — Telehealth: Payer: Self-pay

## 2021-07-25 NOTE — Telephone Encounter (Signed)
Call from pt's granddaughter,Rae, whose mother was pt's caregiver before she passed. And she has stepped into this role. Stated she needs a letter from pt's doctor stating pt is unable to go to the bank, pay his bills and she will be the one doing these tasks for him (in order for pt to continue receiving his benefits). Stated she has a letter stating what is required; I asked if she can bring a copy of the letter for Dr Heber [place] to see in order to write the letter. Stated she will this week and bring any other information she can get. ?

## 2021-07-25 NOTE — Telephone Encounter (Signed)
Pt's granddaughter requesting to speak with a nurse about getting a letter.  ?

## 2021-07-25 NOTE — Addendum Note (Signed)
Addended byCharise Killian on: 07/25/2021 09:05 AM ? ? Modules accepted: Level of Service ? ?

## 2021-07-25 NOTE — Progress Notes (Signed)
Internal Medicine Clinic Attending ° °Case discussed with Dr. DeMaio  At the time of the visit.  We reviewed the resident’s history and exam and pertinent patient test results.  I agree with the assessment, diagnosis, and plan of care documented in the resident’s note. ° ° °

## 2021-07-26 ENCOUNTER — Encounter: Payer: Self-pay | Admitting: Internal Medicine

## 2021-07-26 ENCOUNTER — Telehealth: Payer: Self-pay

## 2021-07-26 NOTE — Progress Notes (Signed)
I have written a letter.  It is accessible from [place] (sent to patient) but can also be printed and sent if needed ?

## 2021-07-26 NOTE — Telephone Encounter (Signed)
? ?  Telephone encounter was:  Successful.  ?07/26/2021 ?Name: [name] MRN: [id number] DOB: 02/06/1925 ? ?[name] is a 88 y.o. year old male who is a primary care patient of [name], DO . The community resource team was consulted for assistance with  Social Security Rep for [employer] information. ? ?Care guide performed the following interventions: Spoke with patient's grand daughter [name], she has completed at SSA-11 form and returned it to [place]. Verified her email [email or web address] and sent Rep for Payees Guide. ? ?Follow Up Plan:  Care guide will follow up with patient by phone over the next 7 days. ? ?[name], [place], CHC ?Care Guide  Embedded Care Coordination ?[facility]  [facility]  ?[address] ?[place],  [address] ???[email or web address]  ?? [phone]   ?[email or web address] ?  ?

## 2021-07-27 ENCOUNTER — Telehealth: Payer: Self-pay

## 2021-07-27 NOTE — Telephone Encounter (Signed)
? ?  Telephone encounter was:  Successful.  ?07/27/2021 ?Name: [name] MRN: [id number] DOB: Jun 16, 1924 ? ?[name] is a 88 y.o. year old male who is a primary care patient of [name], DO . The community resource team was consulted for assistance with  social security information . ? ?Care guide performed the following interventions: Received email confirming receipt of information for Rep for Payees Guide. No further assistance needed at this time. ? ?Follow Up Plan:  No further follow up planned at this time. The patient has been provided with needed resources. ? ?Jaclyne Haverstick, [place], CHC ?Care Guide  Embedded Care Coordination ?  Care Management  ?[address] ?[place], [place] [address] ???[email or web address]  ?? [phone]   ?[email or web address] ?  ?

## 2021-07-27 NOTE — Telephone Encounter (Signed)
Pt's granddaughter stated she received the letter from Dr Heber [place] via My Chart. ?

## 2021-08-04 ENCOUNTER — Ambulatory Visit (INDEPENDENT_AMBULATORY_CARE_PROVIDER_SITE_OTHER): Payer: Medicare HMO | Admitting: Internal Medicine

## 2021-08-04 VITALS — BP 149/92 | HR 64 | Temp 97.7°F | Wt 181.4 lb

## 2021-08-04 DIAGNOSIS — H6122 Impacted cerumen, left ear: Secondary | ICD-10-CM | POA: Diagnosis not present

## 2021-08-04 NOTE — Assessment & Plan Note (Signed)
He returns today for cerumen disimpaction.  At his last office visit on 5/11, cerumen in his left ear was hard and unable to be removed completely.  He is instructed to use some wax softening drops and return in a week. We reattempted irrigation of the left ear today again however the wax remained firm and unable to be removed. - Referred to ENT

## 2021-08-04 NOTE — Progress Notes (Signed)
   Office Visit   Patient ID: KEANU LESNIAK, male    DOB: [birth year], 88 y.o.   MRN: [id number]   PCP: [name], DO   Subjective:  CC: Cerumen Impaction (Left ear/)   ZEBULAN HINSHAW is a 88 y.o. year old male who presents for the above medical condition(s). Please refer to problem based charting for assessment and plan.  Objective:   BP (!) 149/92 (BP Location: Left Arm, Patient Position: Sitting, Cuff Size: Normal)   Pulse 64   Temp 97.7 F (36.5 C) (Oral)   Wt 181 lb 6.4 oz (82.3 kg)   SpO2 100%   BMI 26.79 kg/m  General: Chronically ill-appearing male in no distress Ears: Mild-moderate cerumen in the right ear.  Large amount of cerumen in the left ear which remained present status post irrigation. Assessment & Plan:   Problem List Items Addressed This Visit       Nervous and Auditory   Cerumen impaction - Primary    He returns today for cerumen disimpaction.  At his last office visit on 5/11, cerumen in his left ear was hard and unable to be removed completely.  He is instructed to use some wax softening drops and return in a week. We reattempted irrigation of the left ear today again however the wax remained firm and unable to be removed. - Referred to ENT       Relevant Orders   Ambulatory referral to ENT      Pt discussed with Dr. Adolm Joseph, MD Internal Medicine Resident PGY-3 [facility] 08/04/2021 5:24 PM

## 2021-08-10 NOTE — Progress Notes (Signed)
Internal Medicine Clinic Attending  Case discussed with Dr. Christian  At the time of the visit.  We reviewed the resident's history and exam and pertinent patient test results.  I agree with the assessment, diagnosis, and plan of care documented in the resident's note.  

## 2021-10-12 ENCOUNTER — Ambulatory Visit: Payer: Medicare HMO | Admitting: Podiatry

## 2021-10-12 ENCOUNTER — Encounter: Payer: Self-pay | Admitting: Podiatry

## 2021-10-12 DIAGNOSIS — M79675 Pain in left toe(s): Secondary | ICD-10-CM

## 2021-10-12 DIAGNOSIS — B351 Tinea unguium: Secondary | ICD-10-CM | POA: Diagnosis not present

## 2021-10-12 DIAGNOSIS — M79674 Pain in right toe(s): Secondary | ICD-10-CM | POA: Diagnosis not present

## 2021-10-12 NOTE — Progress Notes (Signed)
  Subjective:  Patient ID: [name], male    DOB: 06/29/1924,  MRN: [id number]  Chief Complaint  Patient presents with   Foot Problem    RM 12   Nail fungus    88 y.o. male returns for the above complaint.  Patient presents with thickened elongated dystrophic toenails x10 mild pain on palpation.  Patient not able to do it on itself he would like for me to do it he denies any other acute complaints.  Objective:  There were no vitals filed for this visit. Podiatric Exam: Vascular: dorsalis pedis and posterior tibial pulses are palpable bilateral. Capillary return is immediate. Temperature gradient is WNL. Skin turgor WNL  Sensorium: Normal Semmes Weinstein monofilament test. Normal tactile sensation bilaterally. Nail Exam: Pt has thick disfigured discolored nails with subungual debris noted bilateral entire nail hallux through fifth toenails.  Pain on palpation to the nails. Ulcer Exam: There is no evidence of ulcer or pre-ulcerative changes or infection. Orthopedic Exam: Muscle tone and strength are WNL. No limitations in general ROM. No crepitus or effusions noted.  Skin: No Porokeratosis. No infection or ulcers    Assessment & Plan:   1. Pain due to onychomycosis of toenails of both feet     Patient was evaluated and treated and all questions answered.  Onychomycosis with pain  -Nails palliatively debrided as below. -Educated on self-care  Procedure: Nail Debridement Rationale: pain  Type of Debridement: manual, sharp debridement. Instrumentation: Nail nipper, rotary burr. Number of Nails: 10  Procedures and Treatment: Consent by patient was obtained for treatment procedures. The patient understood the discussion of treatment and procedures well. All questions were answered thoroughly reviewed. Debridement of mycotic and hypertrophic toenails, 1 through 5 bilateral and clearing of subungual debris. No ulceration, no infection noted.  Return Visit-Office Procedure:  Patient instructed to return to the office for a follow up visit 3 months for continued evaluation and treatment.  Boneta Lucks, DPM    Return in about 3 months (around 01/12/2022) for mayer.

## 2021-10-26 ENCOUNTER — Telehealth: Payer: Self-pay | Admitting: *Deleted

## 2021-10-26 DIAGNOSIS — R300 Dysuria: Secondary | ICD-10-CM | POA: Diagnosis not present

## 2021-10-26 NOTE — Telephone Encounter (Signed)
Returned call to Will regarding verbal order. He states a verbal order was already given this morning by Regino Schultze, Therapist, sports. I agree with the order for cath urine for reflex testing.

## 2021-10-26 NOTE — Telephone Encounter (Signed)
Call from Will [facility] for Patient. Patient has frequent UTI's.  Usually will start leaning to 1 side. Is doing that presently.  Does not urinate on command.  Need order for Cath Urine for Reflex Testing.  Verbal order can be called to Will at [phone].

## 2021-10-28 ENCOUNTER — Emergency Department (HOSPITAL_COMMUNITY)
Admission: EM | Admit: 2021-10-28 | Discharge: 2021-10-28 | Disposition: A | Discharge status: Home / Self Care | Payer: Medicare HMO | Attending: Emergency Medicine | Admitting: Emergency Medicine

## 2021-10-28 ENCOUNTER — Telehealth: Payer: Self-pay | Admitting: *Deleted

## 2021-10-28 ENCOUNTER — Emergency Department (HOSPITAL_COMMUNITY): Payer: Medicare HMO

## 2021-10-28 ENCOUNTER — Encounter (HOSPITAL_COMMUNITY): Payer: Self-pay | Admitting: Emergency Medicine

## 2021-10-28 DIAGNOSIS — I10 Essential (primary) hypertension: Secondary | ICD-10-CM

## 2021-10-28 DIAGNOSIS — Z7982 Long term (current) use of aspirin: Secondary | ICD-10-CM | POA: Diagnosis not present

## 2021-10-28 DIAGNOSIS — Z79899 Other long term (current) drug therapy: Secondary | ICD-10-CM | POA: Diagnosis not present

## 2021-10-28 DIAGNOSIS — Z20822 Contact with and (suspected) exposure to covid-19: Secondary | ICD-10-CM | POA: Insufficient documentation

## 2021-10-28 DIAGNOSIS — R7889 Finding of other specified substances, not normally found in blood: Secondary | ICD-10-CM | POA: Diagnosis not present

## 2021-10-28 DIAGNOSIS — R0902 Hypoxemia: Secondary | ICD-10-CM | POA: Diagnosis not present

## 2021-10-28 DIAGNOSIS — R4182 Altered mental status, unspecified: Secondary | ICD-10-CM | POA: Diagnosis present

## 2021-10-28 DIAGNOSIS — F0390 Unspecified dementia without behavioral disturbance: Secondary | ICD-10-CM | POA: Diagnosis not present

## 2021-10-28 DIAGNOSIS — R310 Gross hematuria: Secondary | ICD-10-CM

## 2021-10-28 DIAGNOSIS — R509 Fever, unspecified: Secondary | ICD-10-CM | POA: Diagnosis not present

## 2021-10-28 DIAGNOSIS — D696 Thrombocytopenia, unspecified: Secondary | ICD-10-CM | POA: Diagnosis not present

## 2021-10-28 DIAGNOSIS — U071 COVID-19: Secondary | ICD-10-CM | POA: Diagnosis not present

## 2021-10-28 LAB — URINALYSIS, ROUTINE W REFLEX MICROSCOPIC

## 2021-10-28 LAB — COMPREHENSIVE METABOLIC PANEL
ALT: 13 U/L (ref 0–44)
AST: 18 U/L (ref 15–41)
Albumin: 3.6 g/dL (ref 3.5–5.0)
Alkaline Phosphatase: 52 U/L (ref 38–126)
Anion gap: 4 — ABNORMAL LOW (ref 5–15)
BUN: 29 mg/dL — ABNORMAL HIGH (ref 8–23)
CO2: 26 mmol/L (ref 22–32)
Calcium: 9.7 mg/dL (ref 8.9–10.3)
Chloride: 111 mmol/L (ref 98–111)
Creatinine, Ser: 1.21 mg/dL (ref 0.61–1.24)
GFR, Estimated: 55 mL/min — ABNORMAL LOW (ref >60)
Glucose, Bld: 115 mg/dL — ABNORMAL HIGH (ref 70–99)
Potassium: 4.2 mmol/L (ref 3.5–5.1)
Sodium: 141 mmol/L (ref 135–145)
Total Bilirubin: 0.8 mg/dL (ref 0.3–1.2)
Total Protein: 7.3 g/dL (ref 6.5–8.1)

## 2021-10-28 LAB — CBC
HCT: 41.5 % (ref 39.0–52.0)
Hemoglobin: 13.1 g/dL (ref 13.0–17.0)
MCH: 30.1 pg (ref 26.0–34.0)
MCHC: 31.6 g/dL (ref 30.0–36.0)
MCV: 95.4 fL (ref 80.0–100.0)
Platelets: 121 10*3/uL — ABNORMAL LOW (ref 150–400)
RBC: 4.35 MIL/uL (ref 4.22–5.81)
RDW: 14.4 % (ref 11.5–15.5)
WBC: 13.1 10*3/uL — ABNORMAL HIGH (ref 4.0–10.5)
nRBC: 0 % (ref 0.0–0.2)

## 2021-10-28 LAB — LACTIC ACID, PLASMA: Lactic Acid, Venous: 1.3 mmol/L (ref 0.5–1.9)

## 2021-10-28 LAB — URINALYSIS, MICROSCOPIC (REFLEX): RBC / HPF: >50 RBC/hpf (ref 0–5)

## 2021-10-28 LAB — SARS CORONAVIRUS 2 BY RT PCR: SARS Coronavirus 2 by RT PCR: NEGATIVE

## 2021-10-28 MED ORDER — CEPHALEXIN 500 MG PO CAPS: 500.0000 mg | ORAL_CAPSULE | Freq: Three times a day (TID) | ORAL | 0 refills | Status: DC

## 2021-10-28 MED ORDER — SODIUM CHLORIDE 0.9 % IV BOLUS
1000.0000 mL | Freq: Once | INTRAVENOUS | Status: AC
Start: 2021-10-28 — End: 2021-10-28
  Administered 2021-10-28: 1000 mL via INTRAVENOUS

## 2021-10-28 MED ORDER — SODIUM CHLORIDE 0.9 % IV SOLN
1.0000 g | Freq: Once | INTRAVENOUS | Status: AC
  Administered 2021-10-28: 1 g via INTRAVENOUS
  Filled 2021-10-28: qty 10

## 2021-10-28 NOTE — ED Notes (Signed)
Pt granddaughter states she will be here in 15 min to pick up patient.

## 2021-10-28 NOTE — Discharge Instructions (Signed)
It was our pleasure to provide your ER care today - we hope that you feel better.  Drink plenty of fluids/stay well hydrated.   Follow up closely with primary care doctor in one week.  Return to ER if worse, new symptoms, chest pain, trouble breathing, severe abdominal pain, persistent vomiting, or other concern.

## 2021-10-28 NOTE — ED Provider Notes (Signed)
Signout from Dr. Ashok Cordia.  88 year old male brought in from home for concern for altered mental status possible UTI.  Had low-grade fever here.  Lab work showing elevated white count.  Urinalysis on a cath sample was hematuria, nursing said he was a difficult cath.  Likely traumatic although will cover with antibiotics.  Plan is to return back to facility plus minus antibiotics. Physical Exam  BP (!) 161/90   Pulse 99   Temp (!) 100.6 F (38.1 C) (Oral)   Resp (!) 29   Ht '5\' 9"'$  (1.753 m)   Wt 83 kg   SpO2 95%   BMI 27.02 kg/m   Physical Exam  Procedures  Procedures  ED Course / MDM    Medical Decision Making Amount and/or Complexity of Data Reviewed Labs: ordered. Radiology: ordered.  Risk Prescription drug management.   Last positive urine culture was Pseudomonas that was pan positive.  Given IV dose of antibiotics and some IV fluids.  Son is here now and said he is at his baseline is comfortable taking him home.  Return instructions discussed.       Hayden Rasmussen, MD 10/29/21 [phone]

## 2021-10-28 NOTE — ED Triage Notes (Signed)
Pt arrives via EMS from home with AMS and possible UTI. Home health RN came to see pt and worried about UTI. Pt denies any pain.

## 2021-10-28 NOTE — ED Provider Notes (Signed)
[facility] DEPT Provider Note   CSN: [id number] Arrival date & time: 10/28/21  1332     History  Chief Complaint  Patient presents with   Altered Mental Status    [name] is a 88 y.o. male.  Patient from home via EMS, per report home health staff concerned about possible uti.  Pt with hx advanced dementia, limited historian - level 5 caveat. No report of fevers, although temp on arrival to ED 100.6. no report of trauma or fall. No reports of pain, trouble breathing, or vomiting.   The history is provided by the patient, medical records and the EMS personnel. The history is limited by the condition of the patient.  Altered Mental Status      Home Medications Prior to Admission medications   Medication Sig Start Date End Date Taking? Authorizing Provider  amLODipine (NORVASC) 5 MG tablet Take 1 tablet (5 mg total) by mouth 2 (two) times daily. 04/28/21 04/28/22  [name], DO  aspirin 81 MG EC tablet Take 1 tablet (81 mg total) by mouth daily. 07/23/20   Maudie Mercury, MD  atorvastatin (LIPITOR) 20 MG tablet Take 1 tablet (20 mg total) by mouth daily. 09/16/20   [name], DO  triamcinolone ointment (KENALOG) 0.1 % Apply 1 application topically 2 (two) times daily as needed (For leg rash). 07/23/20   Maudie Mercury, MD      Allergies    Patient has no known allergies.    Review of Systems   Review of Systems  Unable to perform ROS: Dementia    Physical Exam Updated Vital Signs BP (!) 154/96 (BP Location: Right Arm)   Pulse 99   Temp (!) 100.6 F (38.1 C) (Oral)   Resp 20   Ht 1.753 m ('5\' 9"'$ )   Wt 83 kg   SpO2 94%   BMI 27.02 kg/m  Physical Exam Vitals and nursing note reviewed.  Constitutional:      Appearance: Normal appearance. He is well-developed.  HENT:     Head: Atraumatic.     Nose: Nose normal.     Mouth/Throat:     Mouth: Mucous membranes are moist.     Pharynx: Oropharynx is clear.  Eyes:      General: No scleral icterus.    Conjunctiva/sclera: Conjunctivae normal.     Pupils: Pupils are equal, round, and reactive to light.  Neck:     Vascular: No carotid bruit.     Trachea: No tracheal deviation.     Comments: No stiffness or rigidity.  Cardiovascular:     Rate and Rhythm: Normal rate and regular rhythm.     Pulses: Normal pulses.     Heart sounds: Normal heart sounds. No murmur heard.    No friction rub. No gallop.  Pulmonary:     Effort: Pulmonary effort is normal. No accessory muscle usage or respiratory distress.     Breath sounds: Normal breath sounds.  Abdominal:     General: Bowel sounds are normal. There is no distension.     Palpations: Abdomen is soft.     Tenderness: There is no abdominal tenderness. There is no guarding.  Genitourinary:    Comments: No cva tenderness. Normal external gu exam.  Musculoskeletal:        General: No swelling or tenderness.     Cervical back: Normal range of motion and neck supple. No rigidity or tenderness.  Skin:    General: Skin is warm and  dry.     Findings: No rash.     Comments: No area of cellulitis, abscess or skin ulcer/infected wounds.   Neurological:     Mental Status: He is alert.     Comments: Alert, content appearing. Moves bil extremities purposefully with good strength.   Psychiatric:        Mood and Affect: Mood normal.     ED Results / Procedures / Treatments   Labs (all labs ordered are listed, but only abnormal results are displayed) Results for orders placed or performed during the hospital encounter of 10/28/21  SARS Coronavirus 2 by RT PCR (hospital order, performed in [facility] hospital lab) *cepheid single result test* Anterior Nasal Swab   Specimen: Anterior Nasal Swab  Result Value Ref Range   SARS Coronavirus 2 by RT PCR NEGATIVE NEGATIVE  CBC  Result Value Ref Range   WBC 13.1 (H) 4.0 - 10.5 K/uL   RBC 4.35 4.22 - 5.81 MIL/uL   Hemoglobin 13.1 13.0 - 17.0 g/dL   HCT 41.5 39.0 - 52.0 %    MCV 95.4 80.0 - 100.0 fL   MCH 30.1 26.0 - 34.0 pg   MCHC 31.6 30.0 - 36.0 g/dL   RDW 14.4 11.5 - 15.5 %   Platelets 121 (L) 150 - 400 K/uL   nRBC 0.0 0.0 - 0.2 %  Comprehensive metabolic panel  Result Value Ref Range   Sodium 141 135 - 145 mmol/L   Potassium 4.2 3.5 - 5.1 mmol/L   Chloride 111 98 - 111 mmol/L   CO2 26 22 - 32 mmol/L   Glucose, Bld 115 (H) 70 - 99 mg/dL   BUN 29 (H) 8 - 23 mg/dL   Creatinine, Ser 1.21 0.61 - 1.24 mg/dL   Calcium 9.7 8.9 - 10.3 mg/dL   Total Protein 7.3 6.5 - 8.1 g/dL   Albumin 3.6 3.5 - 5.0 g/dL   AST 18 15 - 41 U/L   ALT 13 0 - 44 U/L   Alkaline Phosphatase 52 38 - 126 U/L   Total Bilirubin 0.8 0.3 - 1.2 mg/dL   GFR, Estimated 55 (L) >60 mL/min   Anion gap 4 (L) 5 - 15  Lactic acid, plasma  Result Value Ref Range   Lactic Acid, Venous 1.3 0.5 - 1.9 mmol/L      EKG None  Radiology DG Chest Portable 1 View  Result Date: 10/28/2021 CLINICAL DATA:  Fevers. EXAM: PORTABLE CHEST 1 VIEW COMPARISON:  04/16/2021 FINDINGS: Stable cardiomediastinal contours. Both lungs are clear. No pleural effusion or edema. No airspace opacities identified. Visualized osseous structures are unremarkable. IMPRESSION: No active cardiopulmonary abnormalities. Electronically Signed   By: Kerby Moors M.D.   On: 10/28/2021 14:47    Procedures Procedures    Medications Ordered in ED Medications - No data to display  ED Course/ Medical Decision Making/ A&P                           Medical Decision Making Problems Addressed: Chronic dementia [facility]): chronic illness or injury with exacerbation, progression, or side effects of treatment that poses a threat to life or bodily functions Essential hypertension: chronic illness or injury with exacerbation, progression, or side effects of treatment that poses a threat to life or bodily functions Fever in adult: acute illness or injury with systemic symptoms that poses a threat to life or bodily  functions Thrombocytopenia ([place]): chronic illness or injury  Amount and/or  Complexity of Data Reviewed Independent Historian: EMS    Details: hx External Data Reviewed: notes. Labs: ordered. Decision-making details documented in ED Course. Radiology: ordered and independent interpretation performed. Decision-making details documented in ED Course.  Risk Prescription drug management. Decision regarding hospitalization.   Iv ns. Continuous pulse ox and cardiac monitoring. Labs ordered/sent. Imaging ordered.   Diff dx includes viral illness, uti, pna, etc. - dispo decision including potential need for admission considered if aki, or other emergent condition on labs/imaging - will get labs and imaging and reassess.   Reviewed nursing notes and prior charts for additional history. External reports reviewed. Additional history from: EMS.   Cardiac monitor: sinus rhythm, rate 96.  Labs reviewed/interpreted by me - lactate normal. Covid neg. Ua pending.   Xrays reviewed/interpreted by me -  no pna.   Recheck 1550, pt alert, content, no distress, await UA.  Signed out to Dr Melina Copa to check UA when resulted and dispo appropriately. Anticipate likely pt will be able to be d/c - if uti, will need dose abx in ED and for home.            Final Clinical Impression(s) / ED Diagnoses Final diagnoses:  None    Rx / DC Orders ED Discharge Orders     None         Lajean Saver, MD 10/28/21 1553

## 2021-10-28 NOTE — Patient Outreach (Signed)
  Care Coordination   Initial Visit Note   10/28/2021 Name: GREEN QUINCY MRN: [id number] DOB: 11-Mar-1925  ISAYAH IGNASIAK is a 88 y.o. year old male who sees [name], DO for primary care. I spoke with  Dwyane Luo, granddaughter of CROSLEY STEJSKAL by phone today  What matters to the patients health and wellness today?  Pt en route to ED per family- interested in follow up call next week regarding Care Coordination.     Goals Addressed   None     SDOH assessments and interventions completed:  No     Care Coordination Interventions Activated:  No  Care Coordination Interventions:  No, not indicated   Follow up plan: Follow up call scheduled for 11/04/21    Encounter Outcome:  Pt. Scheduled   Eduard Clos MSW, LCSW Licensed Clinical Social Worker      [phone]

## 2021-10-30 LAB — URINE CULTURE: Culture: >=100000 — AB

## 2021-10-31 ENCOUNTER — Telehealth: Payer: Self-pay | Admitting: Emergency Medicine

## 2021-10-31 NOTE — Telephone Encounter (Signed)
Post ED Visit - Positive Culture Follow-up  Culture report reviewed by antimicrobial stewardship pharmacist: [place] Team '[]'$  Elenor Quinones, Pharm.D. '[]'$  Heide Guile, Pharm.D., BCPS AQ-ID '[]'$  Parks Neptune, Pharm.D., BCPS '[]'$  Alycia Rossetti, Pharm.D., BCPS '[]'$  [place], Pharm.D., BCPS, AAHIVP '[]'$  Legrand Como, Pharm.D., BCPS, AAHIVP '[]'$  Salome Arnt, PharmD, BCPS '[]'$  Johnnette Gourd, PharmD, BCPS '[]'$  Hughes Better, PharmD, BCPS '[]'$  Leeroy Cha, PharmD '[]'$  Laqueta Linden, PharmD, BCPS '[]'$  Albertina Parr, PharmD  [place] Team '[]'$  Leodis Sias, PharmD '[]'$  Lindell Spar, PharmD '[]'$  Royetta Asal, PharmD '[]'$  Graylin Shiver, Rph '[]'$  Rema Fendt) Glennon Mac, PharmD '[]'$  Arlyn Dunning, PharmD '[]'$  Netta Cedars, PharmD '[]'$  Dia Sitter, PharmD '[]'$  Leone [place], PharmD '[]'$  Gretta Arab, PharmD '[]'$  Theodis Shove, PharmD '[]'$  Peggyann Juba, PharmD '[]'$  Reuel Boom, PharmD Jimmy Footman PharmD   Positive urine culture Treated with cephalexin, organism sensitive to the same and no further patient follow-up is required at this time.  Hazle Nordmann 10/31/2021, 12:10 PM

## 2021-11-04 ENCOUNTER — Ambulatory Visit: Payer: Self-pay | Admitting: *Deleted

## 2021-11-04 ENCOUNTER — Encounter: Payer: Self-pay | Admitting: *Deleted

## 2021-11-04 ENCOUNTER — Telehealth: Payer: Self-pay

## 2021-11-04 DIAGNOSIS — I5032 Chronic diastolic (congestive) heart failure: Secondary | ICD-10-CM

## 2021-11-04 NOTE — Telephone Encounter (Signed)
   Telephone encounter was:  Successful.  11/04/2021 Name: JAXIEL KINES MRN: [id number] DOB: 08-14-1924  ROSALIO CATTERTON is a 88 y.o. year old male who is a primary care patient of [name], DO . The community resource team was consulted for assistance with Caregiver Stress  Care guide performed the following interventions: Patient provided with information about care guide support team and interviewed to confirm resource needs.I have emailed Adult Daycare information to the Patient. They have my contact information if needed in the future  Follow Up Plan:  No further follow up planned at this time. The patient has been provided with needed resources.    [place], Care Management  [phone] [address], [place], [place] [address] Phone: [phone] Email: [email or web address]

## 2021-11-04 NOTE — Patient Outreach (Addendum)
  Care Coordination   Initial Visit Note   11/04/2021 Name: [name] MRN: [id number] DOB: [birth year]  [name] is a 88 y.o. year old male who sees [name], DO for primary care. I spoke with  pt's granddaughter, [name], who is overseeing medical care of [name] by phone today.  [name] reports her mother (pt's daughter) and grandmother(Pt's wife) have passed- the daughter in April of this year. Granddaughter and the 2 grandsons are overseeing pt's care and needs now.  [name] is interested to get all that they can to help support pt's needs. Pt was seen in ED for UTI recently and is also followed by CHF team with home visits per [facility].  Pt has [employer] and partial Medicaid. Family would like to look into community services for pt.   What matters to the patients health and wellness today?  Care coordination of pt's care needs.     Goals Addressed             This Visit's Progress    "To get all the help/guidance and support we can to care for him"       Care Coordination Interventions:  Solution-Focused Strategies employed:  Active listening / Reflection utilized  Emotional Support Provided Problem [place] strategies reviewed Consideration of in-home help encouraged : options discussed [place] [employer] Discussed caregiver resources and support: referral to [place]  Discussed and encouraged review and utilization of all [employer] benefits he may need.   Continue with family care/support- 2 grandsons living with him and providing assistance and supervision; granddaughter helping also and overseeing medical  Consider community based programs- PACE,  Adult Lexicographer, etc Continue with Food Stamps program- complete paperwork received  Expect call from [employer] and [employer]         SDOH assessments and interventions completed:  Yes  SDOH Interventions Today    Flowsheet Row  Most Recent Value  SDOH Interventions   Food Insecurity Interventions Intervention Not Indicated, Other (Comment)  [receives food stamps]  Housing Interventions Intervention Not Indicated  Physical Activity Interventions Intervention Not Indicated  Social Connections Interventions Other (Comment)  [refer to [place]  Transportation Interventions Intervention Not Indicated        Care Coordination Interventions Activated:  Yes  Care Coordination Interventions:  Yes, provided   Follow up plan: Referral made to [facility] and [place] Follow up call scheduled for 12/02/21 1:30    Encounter Outcome:  Pt. Visit Completed   Eduard Clos MSW, LCSW Licensed Clinical Social Worker      [phone]

## 2021-11-09 ENCOUNTER — Other Ambulatory Visit: Payer: Self-pay

## 2021-11-10 ENCOUNTER — Encounter: Payer: Medicare HMO | Admitting: Internal Medicine

## 2021-11-11 ENCOUNTER — Ambulatory Visit: Payer: Self-pay

## 2021-11-11 ENCOUNTER — Other Ambulatory Visit: Payer: Self-pay

## 2021-11-11 DIAGNOSIS — Z789 Other specified health status: Secondary | ICD-10-CM

## 2021-11-11 DIAGNOSIS — I5032 Chronic diastolic (congestive) heart failure: Secondary | ICD-10-CM

## 2021-11-11 MED ORDER — AMLODIPINE BESYLATE 5 MG PO TABS: 5.0000 mg | ORAL_TABLET | Freq: Two times a day (BID) | ORAL | 0 refills | Status: DC

## 2021-11-11 NOTE — Patient Outreach (Signed)
  Care Coordination   Initial Visit Note   11/11/2021 Name: DAVIUS GOUDEAU MRN: [id number] DOB: 11/09/1924  JASRAJ LAPPE is a 88 y.o. year old male who sees [name], DO for primary care. I spoke with  Hassan Buckler Mcphearson's granddaughter by phone today.  What matters to the patients health and wellness today?  Help understanding my grandfathers conditions.    Goals Addressed               This Visit's Progress     Grandaughter would like information on granfathers health (pt-stated)        Care Coordination Interventions: Advised patient's granddaughter that I will send educational information on Vascular dementia and Congestive Heart Failure Care Guide referral for help setting rides with [facility] Active listening / Reflection utilized  Emotional Support Provided Problem [place] strategies reviewed Will follow up in two weeks for any questions about information.        SDOH assessments and interventions completed:  Yes  SDOH Interventions Today    Flowsheet Row Most Recent Value  SDOH Interventions   Food Insecurity Interventions Intervention Not Indicated  Transportation Interventions --  [Care guide]        Care Coordination Interventions Activated:  Yes  Care Coordination Interventions:  Yes, provided   Follow up plan: Follow up call scheduled for 9/29 at 9 am    Encounter Outcome:  Pt. Visit Completed   Lazaro Arms RN, BSN, [facility]   Phone: [phone]

## 2021-11-11 NOTE — Patient Instructions (Signed)
Visit Information  Thank you for taking time to visit with me today. Please don't hesitate to contact me if I can be of assistance to you.   Following are the goals we discussed today:   Goals Addressed               This Visit's Progress     Grandaughter would like information on granfathers health (pt-stated)        Care Coordination Interventions: Advised patient's granddaughter that I will send educational information on Vascular dementia and Congestive Heart Failure Care Guide referral for help setting rides with [facility] Active listening / Reflection utilized  Emotional Support Provided Problem [place] strategies reviewed Will follow up in two weeks for any questions about information.        Our next appointment is by telephone on 9/29 at 9 am  Please call the care guide team at [phone] if you need to cancel or reschedule your appointment.   Patient verbalizes understanding of instructions and care plan provided today and agrees to view in [place]. Active MyChart status and patient understanding of how to access instructions and care plan via MyChart confirmed with patient.     Lazaro Arms RN, BSN, [place] [facility]   Phone: [phone]

## 2021-11-15 ENCOUNTER — Telehealth: Payer: Self-pay | Admitting: *Deleted

## 2021-11-15 NOTE — Telephone Encounter (Signed)
   Telephone encounter was:  Successful.  11/15/2021 Name: GORDY GOAR MRN: [id number] DOB: Nov 01, 1924  [name] is a 88 y.o. year old male who is a primary care patient of [name], DO . The community resource team was consulted for assistance with Transportation Needs  Provided access [place] application and told to also calland check insurance transportation  Care guide performed the following interventions: Patient provided with information about care guide support team and interviewed to confirm resource needs.  Follow Up Plan:  No further follow up planned at this time. The patient has been provided with needed resources.  [place] [phone] [address] , [place] [address] Email : [email or web address]

## 2021-11-25 ENCOUNTER — Ambulatory Visit: Payer: Self-pay

## 2021-11-26 NOTE — Patient Outreach (Signed)
  Care Coordination   Follow Up Visit Note   11/25/21 Name: [name] MRN: [id number] DOB: 10-16-1924  [name] is a 88 y.o. year old male who sees [name], DO for primary care. I spoke with  [name] granddaughter Dwyane Luo by phone today.  What matters to the patients health and wellness today?  She was unable to talk at the time and stated that she would call back.    Goals Addressed               This Visit's Progress     Grandaughter would like information on granfathers health (pt-stated)        Care Coordination Interventions: Advised patient's granddaughter that I will send educational information on Vascular dementia and Congestive Heart Failure Care Guide referral for help setting rides with [facility] Active listening / Reflection utilized  Emotional Support Provided Problem [place] strategies reviewed Granddaughter was unable to talk and stated that she would call back.        SDOH assessments and interventions completed:  No     Care Coordination Interventions Activated:  No  Care Coordination Interventions:  No, not indicated   Follow up plan:  RN  Care Coordinator will wait 7 days for return call if none I will close the case.    Encounter Outcome:  Pt. Visit Completed   Lazaro Arms RN, BSN, [place] [facility]   Phone: [phone]

## 2021-12-02 ENCOUNTER — Ambulatory Visit: Payer: Self-pay | Admitting: *Deleted

## 2021-12-02 NOTE — Patient Outreach (Signed)
  Care Coordination   12/02/2021 Name: CAYSON KALB MRN: [id number] DOB: 1924/06/27   Care Coordination Outreach Attempts:  An unsuccessful telephone outreach was attempted today to offer the patient information about available care coordination services as a benefit of their health plan.   Follow Up Plan:  Additional outreach attempts will be made to offer the patient care coordination information and services.   Encounter Outcome:  Pt. Request to Call Back  Care Coordination Interventions Activated:  No   Care Coordination Interventions:  No, not indicated    Eduard Clos MSW, LCSW Licensed Clinical Social Worker      [phone]

## 2021-12-05 ENCOUNTER — Telehealth: Payer: Self-pay | Admitting: *Deleted

## 2021-12-05 NOTE — Chronic Care Management (AMB) (Signed)
  Care Coordination  Outreach Note  12/05/2021 Name: [name] MRN: [id number] DOB: 1924-03-17   Care Coordination Outreach Attempts: An unsuccessful telephone outreach was attempted today to offer the patient information about available care coordination services as a benefit of their health plan.   Rescheduling   Follow Up Plan:  Additional outreach attempts will be made to offer the patient care coordination information and services.   Encounter Outcome:  No Answer  Julian Hy, [place] Direct Dial: [phone]

## 2021-12-14 ENCOUNTER — Telehealth: Payer: Self-pay

## 2021-12-14 NOTE — Telephone Encounter (Signed)
Return call to Will with palliative care who stated pt's son is interesting in re-starting PT. They do not have PT dept - pt will need an order. Will stated pt's son wanting to keep pt as mobile as possible. Thanks

## 2021-12-14 NOTE — Telephone Encounter (Signed)
Will with [facility] requesting to speak with a nurse. Please call back.

## 2021-12-23 NOTE — Chronic Care Management (AMB) (Unsigned)
  Care Coordination Note  12/23/2021 Name: [name] MRN: [id number] DOB: 02-13-1925  [name] is a 88 y.o. year old male who is a primary care patient of [name], DO and is actively engaged with the care management team. I reached out to [name] by phone today to assist with re-scheduling a follow up visit with the Licensed Clinical Social Worker  Follow up plan: 2nd Unsuccessful telephone outreach attempt made. A HIPAA compliant phone message was left for the patient providing contact information and requesting a return call.   Julian Hy, [place] Direct Dial: [phone]

## 2021-12-26 NOTE — Chronic Care Management (AMB) (Signed)
  Care Coordination Note  12/26/2021 Name: [name] MRN: [id number] DOB: 1925-01-28  [name] is a 88 y.o. year old male who is a primary care patient of [name], DO and is actively engaged with the care management team. I reached out to [name] by phone today to assist with re-scheduling a follow up visit with the Licensed Clinical Social Worker  Follow up plan: We have been unable to make contact with the patient for follow up.   Julian Hy, [place] Direct Dial: [phone]

## 2021-12-27 DIAGNOSIS — H6122 Impacted cerumen, left ear: Secondary | ICD-10-CM | POA: Diagnosis not present

## 2021-12-27 DIAGNOSIS — Z974 Presence of external hearing-aid: Secondary | ICD-10-CM | POA: Diagnosis not present

## 2021-12-29 NOTE — Telephone Encounter (Signed)
Do I need a face to face encounter to reorder PT?

## 2021-12-29 NOTE — Telephone Encounter (Signed)
Yes, F2F is needed for [facility] orders. Or, it can be done virtually as long as you can see each other at least 50% of the conversation. It cannot be done by telehealth visit.

## 2021-12-29 NOTE — Telephone Encounter (Signed)
It looks like we have a visit next month, I think I wont be in clinic any sooner.  Can we get him in with a resident for the F2F (maybe Video telehealth but patient and resident will both need to know that it needs to be video telehealth)?

## 2021-12-30 NOTE — Telephone Encounter (Signed)
Patient sent message via my chart to schedule first available appointment in person or virtually with any provider.

## 2022-01-13 ENCOUNTER — Ambulatory Visit: Payer: Medicare HMO | Admitting: Podiatry

## 2022-01-25 ENCOUNTER — Other Ambulatory Visit: Payer: Self-pay

## 2022-01-26 ENCOUNTER — Other Ambulatory Visit: Payer: Self-pay

## 2022-01-26 ENCOUNTER — Encounter: Payer: Self-pay | Admitting: Internal Medicine

## 2022-01-26 ENCOUNTER — Ambulatory Visit (INDEPENDENT_AMBULATORY_CARE_PROVIDER_SITE_OTHER): Payer: Medicare HMO | Admitting: Internal Medicine

## 2022-01-26 VITALS — BP 156/86 | HR 63 | Temp 98.3°F | Wt 184.4 lb

## 2022-01-26 DIAGNOSIS — N183 Chronic kidney disease, stage 3 unspecified: Secondary | ICD-10-CM

## 2022-01-26 DIAGNOSIS — I130 Hypertensive heart and chronic kidney disease with heart failure and stage 1 through stage 4 chronic kidney disease, or unspecified chronic kidney disease: Secondary | ICD-10-CM

## 2022-01-26 DIAGNOSIS — I5032 Chronic diastolic (congestive) heart failure: Secondary | ICD-10-CM | POA: Diagnosis not present

## 2022-01-26 DIAGNOSIS — F01A0 Vascular dementia, mild, without behavioral disturbance, psychotic disturbance, mood disturbance, and anxiety: Secondary | ICD-10-CM | POA: Diagnosis not present

## 2022-01-26 DIAGNOSIS — G3281 Cerebellar ataxia in diseases classified elsewhere: Secondary | ICD-10-CM | POA: Diagnosis not present

## 2022-01-26 DIAGNOSIS — D329 Benign neoplasm of meninges, unspecified: Secondary | ICD-10-CM

## 2022-01-26 DIAGNOSIS — E785 Hyperlipidemia, unspecified: Secondary | ICD-10-CM | POA: Diagnosis not present

## 2022-01-26 DIAGNOSIS — Z23 Encounter for immunization: Secondary | ICD-10-CM | POA: Diagnosis not present

## 2022-01-26 DIAGNOSIS — I7121 Aneurysm of the ascending aorta, without rupture: Secondary | ICD-10-CM | POA: Diagnosis not present

## 2022-01-26 DIAGNOSIS — G40209 Localization-related (focal) (partial) symptomatic epilepsy and epileptic syndromes with complex partial seizures, not intractable, without status epilepticus: Secondary | ICD-10-CM

## 2022-01-26 DIAGNOSIS — I10 Essential (primary) hypertension: Secondary | ICD-10-CM

## 2022-01-26 MED ORDER — TRIAMCINOLONE ACETONIDE 0.1 % EX OINT: 1.0000 | TOPICAL_OINTMENT | Freq: Two times a day (BID) | CUTANEOUS | 0 refills | Status: DC | PRN

## 2022-01-26 NOTE — Progress Notes (Signed)
Established Patient Office Visit  Subjective   Patient ID: [name], male    DOB: 1924-11-04  Age: 88 y.o. MRN: [id number]  Chief Complaint  Patient presents with   Follow-up   [name] is accompanied by his granddaughter [name] and grandson.  Since the passing of [name] his three grandchildren have been splitting responsibilities.  [name] will be the main go to contact for me and wanted to meet me today to go over all of his medications and diagonsis.  Since about January or so his only routine medications have been '81mg'$  ASA and '5mg'$  of amlodipine, he has not been taking atorvastatin (fallen off- no adverse Rx).  Overall main struggle has been suddently learning to navigate the healthcare system for the family since [name] did all of these things.  It looks like we have tried to involve CM services but there has been some difficulty in contacting.   Main issues is that he if very pleasant and enjoys interacting with the family however tansfers are very diffcult and he requires assistance with ambulation.  THis requires a basically the presence of a male in the house to help him.   Patient Active Problem List   Diagnosis Date Noted   Irritable 05/12/2021   COVID-19 04/26/2021   Aortic aneurysm ([place]) 04/26/2021   Bilateral adrenal adenomas 04/26/2021   Cholelithiasis 04/26/2021   Nocturia 01/14/2021   Diverticulosis of colon 07/19/2020   Thrombocytopenia ([place]) 03/30/2020   Partial symptomatic epilepsy with complex partial seizures, not intractable, without status epilepticus ([name]) 04/17/2019   Chronic diastolic (congestive) heart failure ([place]) 10/28/2017   Cerebellar ataxia in diseases classified elsewhere ([place]) 02/13/2017   Vascular dementia ([place]) 01/28/2016   Presence of IVC filter 07/17/2014   CKD (chronic kidney disease) stage 3, GFR 30-59 ml/min (HCC) 06/23/2013   Hyperlipidemia    BPH (benign prostatic hyperplasia) 04/24/2013   Cerumen impaction 08/30/2012   Hearing loss  07/20/2011   Meningioma ([place]) 07/20/2011   HTN (hypertension) 07/19/2011   Past Medical History:  Diagnosis Date   Anemia    BPH (benign prostatic hypertrophy)    TURP 05/19/13   Cerebral embolism with cerebral infarction ([place]) 12/19/2013   Cerebrovascular accident (CVA) ([place]) 04/17/2019   Chronic kidney disease    CHRONIC KIDNEY DISEASE, 2   COVID-19 03/19/2020   1/7- 03/26/2020 presentation of generalized weakness.  Treated with Decadron. CRP peaked at 2.3 and was 0.6 at discharge.  No D-dimer performed.   Difficulty hearing, right    BILATERAL HEARING LOSS - BEST TO TRY TO SPEAK INTO LEFT EAR   DVT (deep venous thrombosis) (HCC)    Frequent falls    History of DVT (deep vein thrombosis) 06/10/2013   Provoked s/p TURP. Dx per doppler 06/10/13. Complicated by hematuria while on anticoagulation s/p TURP. Initially on lovenox and coumadin, then stopped, then IVC filter placed 06/16/13. Anticoagulation restarted after hematuria stopped. Lovenox was discontinued apparently on 07/29/13 with comment on high risk for falls.   Total ~1.5 months of anticoagulation but interrupted with bleeding complication.   Hyperlipidemia    Hypertension    Incontinence of urine    SOME INCONTINENCE   Left adrenal mass ([place]) 09/11/2013   Meningioma ([place])    Stroke ([place])    Cerebellar, 2013; WALKS WITH WALKER, ABLE TO DRESS AND BATHE HIMSELF BUT FAMILY TRIES TO PROVIDE SUPERVISION BECAUSE OF HIS HX OF FALL AND WEAKNESS LEGS, ARMS    Thrombocytopenia ([place])    Vitreous hemorrhage ([place]) 12/21/2013  OS 12/18/13 CT /MRI brain done for ataxia           Objective:     BP (!) 156/86 (BP Location: Right Arm, Patient Position: Sitting, Cuff Size: Normal)   Pulse 63   Temp 98.3 F (36.8 C) (Oral)   Wt 184 lb 6.4 oz (83.6 kg)   SpO2 94% Comment: RA  BMI 27.23 kg/m  BP Readings from Last 3 Encounters:  01/26/22 (!) 156/86  10/28/21 (!) 172/96  08/04/21 (!) 149/92   Wt Readings from Last 3 Encounters:  01/26/22  184 lb 6.4 oz (83.6 kg)  10/28/21 182 lb 15.7 oz (83 kg)  08/04/21 181 lb 6.4 oz (82.3 kg)      Physical Exam Vitals and nursing note reviewed.  Constitutional:      Appearance: Normal appearance.     Comments: In transport wheelchair  HENT:     Right Ear: Decreased hearing noted.     Left Ear: Decreased hearing noted.  Cardiovascular:     Rate and Rhythm: Normal rate.     Heart sounds: Normal heart sounds.  Pulmonary:     Effort: Pulmonary effort is normal.     Breath sounds: Normal breath sounds. No wheezing, rhonchi or rales.  Abdominal:     General: Abdomen is flat. Bowel sounds are normal.     Palpations: Abdomen is soft.  Neurological:     Mental Status: He is alert.  Psychiatric:        Mood and Affect: Mood normal.        Behavior: Behavior normal.      Results for orders placed or performed in visit on 01/26/22  Lipid Profile  Result Value Ref Range   Cholesterol, Total 186 100 - 199 mg/dL   Triglycerides 49 0 - 149 mg/dL   HDL 56 >39 mg/dL   VLDL Cholesterol Cal 9 5 - 40 mg/dL   LDL Chol Calc (NIH) 121 (H) 0 - 99 mg/dL   Chol/HDL Ratio 3.3 0.0 - 5.0 ratio    Last CBC Lab Results  Component Value Date   WBC 13.1 (H) 10/28/2021   HGB 13.1 10/28/2021   HCT 41.5 10/28/2021   MCV 95.4 10/28/2021   MCH 30.1 10/28/2021   RDW 14.4 10/28/2021   PLT 121 (L) 23/55/7322   Last metabolic panel Lab Results  Component Value Date   GLUCOSE 115 (H) 10/28/2021   NA 141 10/28/2021   K 4.2 10/28/2021   CL 111 10/28/2021   CO2 26 10/28/2021   BUN 29 (H) 10/28/2021   CREATININE 1.21 10/28/2021   GFRNONAA 55 (L) 10/28/2021   CALCIUM 9.7 10/28/2021   PHOS 4.2 10/07/2018   PROT 7.3 10/28/2021   ALBUMIN 3.6 10/28/2021   BILITOT 0.8 10/28/2021   ALKPHOS 52 10/28/2021   AST 18 10/28/2021   ALT 13 10/28/2021   ANIONGAP 4 (L) 10/28/2021   Last hemoglobin A1c Lab Results  Component Value Date   HGBA1C 5.5 11/03/2018      The ASCVD Risk score (Arnett DK, et  al., 2019) failed to calculate for the following reasons:   The 2019 ASCVD risk score is only valid for ages 77 to 73    Assessment & Plan:   Problem List Items Addressed This Visit       Cardiovascular and Mediastinum   HTN (hypertension) (Chronic)    BP slightly elevated.  We discussed [name] typically would give '5mg'$  amlodipine every morning and base the PM dose more  on his blood pressure.  They do not currently have a cuff.  His blood pressure is a little elevated but is asymptomatic.  Disucssed potentially increasing to '10mg'$  tablet, however he is overall doing well and given our [place] this is not a priority.      Chronic diastolic (congestive) heart failure (HCC) (Chronic)    Appears euvolemic.  Stable, no current need for diuretics.  Discussed limiting salt intake.      Aortic aneurysm (HCC) (Chronic)    Discussed Dx, given very slow growth, age and co morbid conditions we discussed no plans for routine imaging as he would not tolerate a surgical repair.        Nervous and Auditory   Vascular dementia ([place]) (Chronic)    Discussed dx, family fells memory is stable.      Partial symptomatic epilepsy with complex partial seizures, not intractable, without status epilepticus ([place]) (Chronic)    No reports of any new seizure activity, no currently taking any AED.  Will monitor.      Meningioma (HCC) (Chronic)    Remains present. We discussed no planned intervention due to age and comorbidities,      Cerebellar ataxia in diseases classified elsewhere [facility]) - Primary   Relevant Orders   Ambulatory referral to Home Health     Genitourinary   CKD (chronic kidney disease) stage 3, GFR 30-59 ml/min (HCC) (Chronic)    Stable, reivewed dx and no current interventions other than gross BP control        Other   Hyperlipidemia    Check lipid panel. Reviewed indicatations for statin ([facility], [facility]) however also discussed at this age our overall goals are for quality.  She will discuss  with family and get back to me if they wish to resume atorvasatin.      Relevant Orders   Lipid Profile (Completed)   Other Visit Diagnoses     Need for immunization against influenza       Relevant Orders   Flu Vaccine QUAD High Dose([facility]) (Completed)       Return in about 6 months (around 07/27/2022).    [name], DO

## 2022-01-27 LAB — LIPID PANEL
Chol/HDL Ratio: 3.3 ratio (ref 0.0–5.0)
Cholesterol, Total: 186 mg/dL (ref 100–199)
HDL: 56 mg/dL
LDL Chol Calc (NIH): 121 mg/dL — ABNORMAL HIGH (ref 0–99)
Triglycerides: 49 mg/dL (ref 0–149)
VLDL Cholesterol Cal: 9 mg/dL (ref 5–40)

## 2022-01-28 NOTE — Assessment & Plan Note (Signed)
BP slightly elevated.  We discussed [name] typically would give '5mg'$  amlodipine every morning and base the PM dose more on his blood pressure.  They do not currently have a cuff.  His blood pressure is a little elevated but is asymptomatic.  Disucssed potentially increasing to '10mg'$  tablet, however he is overall doing well and given our [place] this is not a priority.

## 2022-01-28 NOTE — Assessment & Plan Note (Signed)
Discussed Dx, given very slow growth, age and co morbid conditions we discussed no plans for routine imaging as he would not tolerate a surgical repair.

## 2022-01-28 NOTE — Assessment & Plan Note (Signed)
Discussed dx, family fells memory is stable.

## 2022-01-28 NOTE — Assessment & Plan Note (Signed)
Stable, reivewed dx and no current interventions other than gross BP control

## 2022-01-28 NOTE — Assessment & Plan Note (Signed)
No reports of any new seizure activity, no currently taking any AED.  Will monitor.

## 2022-01-28 NOTE — Assessment & Plan Note (Signed)
Patient has had additional decline.  To maximize ability to remain community dewelling with his grandchildren will reorder HHPT to assist in strength training and transfers.

## 2022-01-28 NOTE — Assessment & Plan Note (Signed)
Appears euvolemic.  Stable, no current need for diuretics.  Discussed limiting salt intake.

## 2022-01-28 NOTE — Assessment & Plan Note (Signed)
Check lipid panel. Reviewed indicatations for statin ([facility], [facility]) however also discussed at this age our overall goals are for quality.  She will discuss with family and get back to me if they wish to resume atorvasatin.

## 2022-01-28 NOTE — Assessment & Plan Note (Signed)
Remains present. We discussed no planned intervention due to age and comorbidities,

## 2022-01-31 ENCOUNTER — Ambulatory Visit: Payer: Medicare HMO | Admitting: Podiatry

## 2022-02-01 MED ORDER — AMLODIPINE BESYLATE 5 MG PO TABS: 5.0000 mg | ORAL_TABLET | Freq: Every day | ORAL | 3 refills | Status: DC

## 2022-03-16 ENCOUNTER — Telehealth: Payer: Self-pay | Admitting: *Deleted

## 2022-03-16 NOTE — Telephone Encounter (Signed)
Will, Palliative RN with [facility], called in stating he has been seeing patient every 3-4 weeks > 1 year. At yesterday's visit he noted diminished breath sounds in both lower lobes, and 1+ pitting edema in right ankle and a little less in left ankle. States this is a new finding for him and wonders if PCP would like patient to take an extra dose of lasix. Denies SHOB. Unable to obtain weight 2/2 patient's significant balance issues. Please advise.

## 2022-03-17 NOTE — Telephone Encounter (Signed)
Left PRN order below on Will's secure VM.

## 2022-03-17 NOTE — Telephone Encounter (Signed)
That would be appropriate to give extra dose of lasix PRN for the edema/ shortness of breath

## 2022-05-05 ENCOUNTER — Telehealth: Payer: Self-pay

## 2022-05-05 NOTE — Telephone Encounter (Signed)
Will from [facility]  [phone] is requesting a call back ... He has a report ot give not in the Patient home at the time

## 2022-05-08 NOTE — Telephone Encounter (Signed)
Could we get him in for an inperson evaluation?

## 2022-05-08 NOTE — Telephone Encounter (Signed)
Return call to Will Palliative nurse with [facility]. He saw pt on Friday; he's reporting to pt's doctor he noticed "popping sounds in both lungs like stridors". He also stated pt has been in their program over 1.5 yrs so they will be discharging pt soon.

## 2022-05-08 NOTE — Telephone Encounter (Signed)
Please schedule pt an in person eval appt per Dr Heber [place]. Thanks

## 2022-05-09 NOTE — Telephone Encounter (Signed)
Done pt was made appt with Dr Collene Gobble 3/6

## 2022-05-17 ENCOUNTER — Encounter: Payer: Medicare HMO | Admitting: Student

## 2022-05-30 ENCOUNTER — Ambulatory Visit (INDEPENDENT_AMBULATORY_CARE_PROVIDER_SITE_OTHER): Payer: Medicare HMO | Admitting: Student

## 2022-05-30 ENCOUNTER — Encounter: Payer: Self-pay | Admitting: Student

## 2022-05-30 ENCOUNTER — Ambulatory Visit (INDEPENDENT_AMBULATORY_CARE_PROVIDER_SITE_OTHER): Payer: Medicare HMO

## 2022-05-30 VITALS — BP 155/89 | HR 69 | Ht 69.0 in

## 2022-05-30 DIAGNOSIS — Z0000 Encounter for general adult medical examination without abnormal findings: Secondary | ICD-10-CM | POA: Diagnosis not present

## 2022-05-30 DIAGNOSIS — I10 Essential (primary) hypertension: Secondary | ICD-10-CM | POA: Diagnosis not present

## 2022-05-30 DIAGNOSIS — R918 Other nonspecific abnormal finding of lung field: Secondary | ICD-10-CM

## 2022-05-30 MED ORDER — FUROSEMIDE 40 MG PO TABS: 40.0000 mg | ORAL_TABLET | ORAL | 11 refills | Status: DC

## 2022-05-30 NOTE — Progress Notes (Signed)
Subjective:   [name] is a 89 y.o. male who presents for Medicare Annual/Subsequent preventive examination. I connected with  [name] on 05/30/22 by a  Face-To-Face encounter  and verified that I am speaking with the correct person using two identifiers.  Patient Location: Other:  Office/Clinic  Provider Location: Office/Clinic  I discussed the limitations of evaluation and management by telemedicine. The patient expressed understanding and agreed to proceed.  Review of Systems    Defer to PCP       Objective:    Today's Vitals   05/30/22 1304  BP: (!) 155/89  Pulse: 69  SpO2: 96%  Height: 5\' 9"  (1.753 m)   Body mass index is 27.23 kg/m.     05/30/2022    1:05 PM 05/30/2022   11:14 AM 01/26/2022   12:03 PM 05/12/2021   11:31 AM 04/26/2021   12:20 AM 03/21/2021   11:22 AM 02/12/2021   11:52 AM  Advanced Directives  Does Patient Have a Medical Advance Directive? Yes Yes Yes Yes Unable to assess, patient is non-responsive or altered mental status Yes Yes  Type of Advance Directive Living will Living will Healthcare Power of [place];Living will   [place]  Does patient want to make changes to medical advance directive? No - Patient declined No - Patient declined No - Patient declined No - Patient declined  No - Guardian declined No - Patient declined  Copy of [place] in Chart?   Yes - validated most recent copy scanned in chart (See row information) Yes - validated most recent copy scanned in chart (See row information)       Current Medications (verified) Outpatient Encounter Medications as of 05/30/2022  Medication Sig   amLODipine (NORVASC) 5 MG tablet Take 1 tablet (5 mg total) by mouth daily.   aspirin 81 MG EC tablet Take 1 tablet (81 mg total) by mouth daily.   furosemide (LASIX) 40 MG tablet Take 1 tablet (40 mg total) by mouth every other day.   triamcinolone ointment (KENALOG) 0.1 %  Apply 1 Application topically 2 (two) times daily as needed (For leg rash).   No facility-administered encounter medications on file as of 05/30/2022.    Allergies (verified) Patient has no known allergies.   History: Past Medical History:  Diagnosis Date   Anemia    BPH (benign prostatic hypertrophy)    TURP 05/19/13   Cerebral embolism with cerebral infarction ([place]) 12/19/2013   Cerebrovascular accident (CVA) ([place]) 04/17/2019   Chronic kidney disease    CHRONIC KIDNEY DISEASE, 2   COVID-19 03/19/2020   1/7- 03/26/2020 presentation of generalized weakness.  Treated with Decadron. CRP peaked at 2.3 and was 0.6 at discharge.  No D-dimer performed.   Difficulty hearing, right    BILATERAL HEARING LOSS - BEST TO TRY TO SPEAK INTO LEFT EAR   DVT (deep venous thrombosis) (HCC)    Frequent falls    History of DVT (deep vein thrombosis) 06/10/2013   Provoked s/p TURP. Dx per doppler 06/10/13. Complicated by hematuria while on anticoagulation s/p TURP. Initially on lovenox and coumadin, then stopped, then IVC filter placed 06/16/13. Anticoagulation restarted after hematuria stopped. Lovenox was discontinued apparently on 07/29/13 with comment on high risk for falls.   Total ~1.5 months of anticoagulation but interrupted with bleeding complication.   Hyperlipidemia    Hypertension    Incontinence of urine    SOME INCONTINENCE  Left adrenal mass ([place]) 09/11/2013   Meningioma ([place])    Stroke ([place])    Cerebellar, 2013; WALKS WITH WALKER, ABLE TO DRESS AND BATHE HIMSELF BUT FAMILY TRIES TO PROVIDE SUPERVISION BECAUSE OF HIS HX OF FALL AND WEAKNESS LEGS, ARMS    Thrombocytopenia ([place])    Vitreous hemorrhage ([place]) 12/21/2013   OS 12/18/13 CT /MRI brain done for ataxia    Past Surgical History:  Procedure Laterality Date   CYSTOSCOPY N/A 06/13/2013   Procedure: CYSTOSCOPY FLEXIBLE BEDSIDE;  Surgeon: Ardis Hughs, MD;  Location: [facility];  Service: Urology;  Laterality: N/A;   MYRINGOTOMY WITH TUBE  PLACEMENT Bilateral    TRANSURETHRAL RESECTION OF PROSTATE N/A 05/19/2013   Procedure: TRANSURETHRAL RESECTION OF THE PROSTATE WITH GYRUS INSTRUMENTS;  Surgeon: Ailene Rud, MD;  Location: [facility];  Service: Urology;  Laterality: N/A;   Family History  Problem Relation Age of Onset   Diabetes Mother    Heart disease Mother    Hypertension Mother    Heart disease Father    Hypertension Father    Hypertension Daughter    Thyroid disease Daughter    Social History   Socioeconomic History   Marital status: Widowed    Spouse name: Not on file   Number of children: 1   Years of education: 10th grade   Highest education level: Not on file  Occupational History   Occupation: Retired  Tobacco Use   Smoking status: Never   Smokeless tobacco: Never  Vaping Use   Vaping Use: Never used  Substance and Sexual Activity   Alcohol use: No    Alcohol/week: 0.0 standard drinks of alcohol   Drug use: No   Sexual activity: Never  Other Topics Concern   Not on file  Social History Narrative   Current Social History 03/21/2021      Patient lives with his daughter, [name] in a home which is 1 story. There are 3 steps up to the entrance with handrails and a ramp which the patient uses.       Patient's method of transportation is via his daughter.      The highest level of education was 10th grade      The patient is currently retired.      Identified important Relationships as his daughter, [name].       Pets : No       Interests / Fun: He enjoys sitting outside on the porch.       Current Stressors: Can't do what he used to be able to do, per his daughter       Religious / Personal Beliefs: Catholic              Social Determinants of Health   Financial Resource Strain: Low Risk  (05/30/2022)   Overall Financial Resource Strain (CARDIA)    Difficulty of Paying Living Expenses: Not very hard  Food Insecurity: Food Insecurity Present (05/30/2022)   Hunger Vital Sign     Worried About [place] in the Last Year: Sometimes true    Ran Out of Food in the Last Year: Sometimes true  Transportation Needs: No Transportation Needs (05/30/2022)   PRAPARE - Hydrologist (Medical): No    Lack of Transportation (Non-Medical): No  Physical Activity: Inactive (05/30/2022)   Exercise Vital Sign    Days of Exercise per Week: 0 days    Minutes of Exercise per Session: 60 min  Stress:  No Stress Concern Present (05/30/2022)   [place]    Feeling of Stress : Not at all  Social Connections: Socially Isolated (05/30/2022)   Social Connection and Isolation Panel [NHANES]    Frequency of Communication with Friends and Family: Never    Frequency of Social Gatherings with Friends and Family: Twice a week    Attends Religious Services: Never    Marine scientist or Organizations: No    Attends Archivist Meetings: Never    Marital Status: Widowed    Tobacco Counseling Counseling given: Not Answered   Clinical Intake:  Pre-visit preparation completed: Yes  Pain : No/denies pain     Nutritional Risks: None Diabetes: No  How often do you need to have someone help you when you read instructions, pamphlets, or other written materials from your doctor or pharmacy?: 5 - Always  Diabetic?No  Interpreter Needed?: No  Information entered by :: Felina Tello,cma   Activities of Daily Living    05/30/2022    1:05 PM 05/30/2022   11:14 AM  In your present state of health, do you have any difficulty performing the following activities:  Hearing? 1 1  Vision? 1 1  Difficulty concentrating or making decisions? 1 1  Walking or climbing stairs? 1 1  Dressing or bathing? 1 1  Doing errands, shopping? 1 1    Patient Care Team: [name], DO as PCP - General (Internal Medicine)  Indicate any recent Medical Services you may have received from other  than [facility] providers in the past year (date may be approximate).     Assessment:   This is a routine wellness examination for [name].  Hearing/Vision screen No results found.  Dietary issues and exercise activities discussed:     Goals Addressed   None   Depression Screen    05/30/2022    1:05 PM 05/30/2022   11:14 AM 01/26/2022   11:42 AM 05/12/2021   11:29 AM 03/21/2021   11:22 AM 02/10/2021    2:57 PM 01/13/2021   12:01 PM  PHQ 2/9 Scores  PHQ - 2 Score 0 0 0 0 0 0 0  PHQ- 9 Score      0     Fall Risk    05/30/2022    1:05 PM 05/30/2022   11:14 AM 01/26/2022   11:42 AM 08/04/2021   11:32 AM 07/21/2021   11:28 AM  [place] in the past year? 0 0 0 0 0  Number falls in past yr: 0 0 0 0 0  Injury with Fall? 0 0 0 0 0  Risk for fall due to : No Fall Risks No Fall Risks Impaired mobility;Impaired balance/gait Impaired balance/gait No Fall Risks  Follow up Falls evaluation completed;Falls prevention discussed Falls evaluation completed;Falls prevention discussed Falls evaluation completed;Falls prevention discussed Falls evaluation completed Falls evaluation completed    FALL RISK PREVENTION PERTAINING TO THE HOME:  Any stairs in or around the home? No  If so, are there any without handrails? No  Home free of loose throw rugs in walkways, pet beds, electrical cords, etc? Yes  Adequate lighting in your home to reduce risk of falls? Yes   ASSISTIVE DEVICES UTILIZED TO PREVENT FALLS:  Life alert? No  Use of a cane, walker or w/c? Yes  Grab bars in the bathroom? No  Shower chair or bench in shower? No  Elevated toilet seat or a  handicapped toilet? No   TIMED UP AND GO:  Was the test performed? No .  Length of time to ambulate 10 feet: 0 sec.   Gait unsteady with use of assistive device, provider informed and education provided.   Cognitive Function:        03/21/2021   11:27 AM  6CIT Screen  What Year? 4 points  What month? 0 points  What time? 0 points   Count back from 20 4 points  Months in reverse 4 points  Repeat phrase 10 points  Total Score 22 points    Immunizations Immunization History  Administered Date(s) Administered   Fluad Quad(high Dose 65+) 01/13/2021, 01/26/2022   Influenza, High Dose Seasonal PF 02/20/2017   Influenza,inj,Quad PF,6+ Mos 02/03/2013, 01/15/2014, 04/27/2016, 11/08/2017, 11/25/2018   PFIZER(Purple Top)SARS-COV-2 Vaccination 05/11/2019, 06/10/2019   PPD Test 01/28/2016   Pneumococcal Conjugate-13 04/27/2016   Pneumococcal Polysaccharide-23 02/03/2013    TDAP status: Due, Education has been provided regarding the importance of this vaccine. Advised may receive this vaccine at local pharmacy or Health Dept. Aware to provide a copy of the vaccination record if obtained from local pharmacy or Health Dept. Verbalized acceptance and understanding.  Flu Vaccine status: Up to date  Pneumococcal vaccine status: Up to date  Covid-19 vaccine status: Completed vaccines  Qualifies for Shingles Vaccine? No   Zostavax completed No   Shingrix Completed?: No.    Education has been provided regarding the importance of this vaccine. Patient has been advised to call insurance company to determine out of pocket expense if they have not yet received this vaccine. Advised may also receive vaccine at local pharmacy or Health Dept. Verbalized acceptance and understanding.  Screening Tests Health Maintenance  Topic Date Due   DTaP/Tdap/Td (1 - Tdap) Never done   Zoster Vaccines- Shingrix (1 of 2) Never done   COVID-19 Vaccine (3 - 2023-24 season) 11/11/2021   Medicare Annual Wellness (AWV)  05/30/2023   Pneumonia Vaccine 12+ Years old  Completed   INFLUENZA VACCINE  Completed   HPV VACCINES  Aged Out    Health Maintenance  Health Maintenance Due  Topic Date Due   DTaP/Tdap/Td (1 - Tdap) Never done   Zoster Vaccines- Shingrix (1 of 2) Never done   COVID-19 Vaccine (3 - 2023-24 season) 11/11/2021      Lung  Cancer Screening: (Low Dose CT Chest recommended if Age 56-80 years, 30 pack-year currently smoking OR have quit w/in 15years.) does not qualify.   Lung Cancer Screening Referral: N/A  Additional Screening:  Hepatitis C Screening: does not qualify; Completed N/A  Vision Screening: Recommended annual ophthalmology exams for early detection of glaucoma and other disorders of the eye. Is the patient up to date with their annual eye exam?  No  Who is the provider or what is the name of the office in which the patient attends annual eye exams? N/A If pt is not established with a provider, would they like to be referred to a provider to establish care? Yes .   Dental Screening: Recommended annual dental exams for proper oral hygiene  Community Resource Referral / Chronic Care Management: CRR required this visit?  No   CCM required this visit?  No      Plan:     I have personally reviewed and noted the following in the patient's chart:   Medical and social history Use of alcohol, tobacco or illicit drugs  Current medications and supplements including opioid prescriptions. Patient is not  currently taking opioid prescriptions. Functional ability and status Nutritional status Physical activity Advanced directives List of other physicians Hospitalizations, surgeries, and ER visits in previous 12 months Vitals Screenings to include cognitive, depression, and falls Referrals and appointments  In addition, I have reviewed and discussed with patient certain preventive protocols, quality metrics, and best practice recommendations. A written personalized care plan for preventive services as well as general preventive health recommendations were provided to patient.     Kerin Perna, [place]   05/30/2022   Nurse Notes: Face-To-Face Visit  [name] , Thank you for taking time to come for your Medicare Wellness Visit. I appreciate your ongoing commitment to your health goals. Please review  the following plan we discussed and let me know if I can assist you in the future.   These are the goals we discussed:  Goals       "To get all the help/guidance and support we can to care for him"      Care Coordination Interventions:  Solution-Focused Strategies employed:  Active listening / Reflection utilized  Emotional Support Provided Problem [place] strategies reviewed Consideration of in-home help encouraged : options discussed [place] [employer] Attorney Discussed caregiver resources and support: referral to [facility]  Discussed and encouraged review and utilization of all [employer] benefits he may need.   Continue with family care/support- 2 grandsons living with him and providing assistance and supervision; granddaughter helping also and overseeing medical  Consider community based programs- PACE,  Adult Lexicographer, etc Continue with Food Stamps program- complete paperwork received  Expect call from [employer] and [employer]       Blood Pressure < 150/90      Grandaughter would like information on granfathers health (pt-stated)      Care Coordination Interventions: Advised patient's granddaughter that I will send educational information on Vascular dementia and Congestive Heart Failure Care Guide referral for help setting rides with [facility] Active listening / Reflection utilized  Emotional Support Provided Problem [place] strategies reviewed Granddaughter was unable to talk and stated that she would call back.      Increase strength in legs      Daughter will assist patient begin seated and standing exercises with exercise band.         Prevent Falls        This is a list of the screening recommended for you and due dates:  Health Maintenance  Topic Date Due   DTaP/Tdap/Td vaccine (1 - Tdap) Never done   Zoster (Shingles) Vaccine (1 of 2) Never done   COVID-19  Vaccine (3 - 2023-24 season) 11/11/2021   Medicare Annual Wellness Visit  05/30/2023   Pneumonia Vaccine  Completed   Flu Shot  Completed   HPV Vaccine  Aged Out

## 2022-05-31 DIAGNOSIS — R918 Other nonspecific abnormal finding of lung field: Secondary | ICD-10-CM | POA: Insufficient documentation

## 2022-05-31 NOTE — Assessment & Plan Note (Addendum)
Assessment: Patient presents today after home health nurse noticed abnormal lung sounds in the left upper lung field. [name] is in his usual state of health. HE denies acute cough, chest pain, shortness of breath, fever or chills. He is elderly appearing but in good spirits. On my lung exam he is clear to auscultation in all lung fields, without wheezing, rhonchi, or rales. There is no dullness to percussion  At this time I have little concern for an infectious process or effusion. Given strict precautions if fever, chills, dyspneic, change in mentation or cough to present back to clinic immediately  Plan: - continue to monitor

## 2022-05-31 NOTE — Progress Notes (Signed)
CC: abnormal lung sounds  HPI:  [name] is a 89 y.o. male living with a history stated below and presents today after his home nurse heard abnormal lung sounds in the lung upper lung region. Please see problem based assessment and plan for additional details.  Past Medical History:  Diagnosis Date   Anemia    BPH (benign prostatic hypertrophy)    TURP 05/19/13   Cerebral embolism with cerebral infarction ([place]) 12/19/2013   Cerebrovascular accident (CVA) ([place]) 04/17/2019   Chronic kidney disease    CHRONIC KIDNEY DISEASE, 2   COVID-19 03/19/2020   1/7- 03/26/2020 presentation of generalized weakness.  Treated with Decadron. CRP peaked at 2.3 and was 0.6 at discharge.  No D-dimer performed.   Difficulty hearing, right    BILATERAL HEARING LOSS - BEST TO TRY TO SPEAK INTO LEFT EAR   DVT (deep venous thrombosis) (HCC)    Frequent falls    History of DVT (deep vein thrombosis) 06/10/2013   Provoked s/p TURP. Dx per doppler 06/10/13. Complicated by hematuria while on anticoagulation s/p TURP. Initially on lovenox and coumadin, then stopped, then IVC filter placed 06/16/13. Anticoagulation restarted after hematuria stopped. Lovenox was discontinued apparently on 07/29/13 with comment on high risk for falls.   Total ~1.5 months of anticoagulation but interrupted with bleeding complication.   Hyperlipidemia    Hypertension    Incontinence of urine    SOME INCONTINENCE   Left adrenal mass ([place]) 09/11/2013   Meningioma ([place])    Stroke ([place])    Cerebellar, 2013; WALKS WITH WALKER, ABLE TO DRESS AND BATHE HIMSELF BUT FAMILY TRIES TO PROVIDE SUPERVISION BECAUSE OF HIS HX OF FALL AND WEAKNESS LEGS, ARMS    Thrombocytopenia ([place])    Vitreous hemorrhage ([place]) 12/21/2013   OS 12/18/13 CT /MRI brain done for ataxia     Current Outpatient Medications on File Prior to Visit  Medication Sig Dispense Refill   amLODipine (NORVASC) 5 MG tablet Take 1 tablet (5 mg total) by mouth daily. 90 tablet 3    aspirin 81 MG EC tablet Take 1 tablet (81 mg total) by mouth daily. 30 tablet 0   triamcinolone ointment (KENALOG) 0.1 % Apply 1 Application topically 2 (two) times daily as needed (For leg rash). 80 g 0   No current facility-administered medications on file prior to visit.    Review of Systems: ROS negative except for what is noted on the assessment and plan.  Vitals:   05/30/22 1111  BP: (!) 155/89  Pulse: 69  SpO2: 96%  Height: 5\' 9"  (1.753 m)    Physical Exam: Constitutional: elderly, well appearing HENT: normocephalic atraumatic, left hearing aid in place Eyes: conjunctiva non-erythematous Neck: supple Cardiovascular: regular rate and rhythm, no m/r/g Pulmonary/Chest: normal work of breathing on room air, lungs clear to auscultation bilaterally MSK: normal bulk and tone Neurological: alert Skin: warm and dry Psych: pleasant  Assessment & Plan:   Lung field abnormal finding on examination Assessment: Patient presents today after home health nurse noticed abnormal lung sounds in the left upper lung field. [name] is in his usual state of health. HE denies acute cough, chest pain, shortness of breath, fever or chills. He is elderly appearing but in good spirits. On my lung exam he is clear to auscultation in all lung fields, without wheezing, rhonchi, or rales. There is no dullness to percussion  At this time I have little concern for an infectious process or effusion. Given strict precautions  if fever, chills, dyspneic, change in mentation or cough to present back to clinic immediately  Plan: - continue to monitor  HTN (hypertension) Assessment: BP elevated today. Adherent to amlodipine 5 mg and lasix every other day. Patient, his family, and pcp have had discussions regarding additional therapies however with ataxia are okay with higher BP goal  Plan: - continue amlodipine 5 mg daily and lasix every other day  Patient discussed with Dr. Butch Penny,  D.O. [facility], PGY-3 Phone: [phone] Date 05/31/2022 Time 9:12 AM

## 2022-05-31 NOTE — Assessment & Plan Note (Signed)
Assessment: BP elevated today. Adherent to amlodipine 5 mg and lasix every other day. Patient, his family, and pcp have had discussions regarding additional therapies however with ataxia are okay with higher BP goal  Plan: - continue amlodipine 5 mg daily and lasix every other day

## 2022-05-31 NOTE — Progress Notes (Signed)
Internal Medicine Clinic Attending  Case discussed with Dr. Katsadouros  At the time of the visit.  We reviewed the resident's history and exam and pertinent patient test results.  I agree with the assessment, diagnosis, and plan of care documented in the resident's note.  

## 2022-06-01 NOTE — Progress Notes (Signed)
Internal Medicine Clinic Attending  Case and documentation of Dr. Johnney Ou reviewed.  I reviewed the AWV findings.  I agree with the assessment, diagnosis, and plan of care documented in the AWV note.

## 2022-06-01 NOTE — Addendum Note (Signed)
Addended by: Riesa Pope on: 06/01/2022 09:40 AM   Modules accepted: Level of Service

## 2022-07-24 ENCOUNTER — Encounter: Payer: Medicare HMO | Admitting: Student

## 2022-07-29 ENCOUNTER — Other Ambulatory Visit: Payer: Self-pay

## 2022-07-29 ENCOUNTER — Emergency Department (HOSPITAL_COMMUNITY): Payer: Medicare HMO

## 2022-07-29 ENCOUNTER — Observation Stay (HOSPITAL_COMMUNITY)
Admission: EM | Admit: 2022-07-29 | Discharge: 2022-08-01 | Disposition: A | Discharge status: Skilled Nursing Facility | Payer: Medicare HMO | Attending: Internal Medicine | Admitting: Internal Medicine

## 2022-07-29 ENCOUNTER — Observation Stay (HOSPITAL_COMMUNITY): Payer: Medicare HMO

## 2022-07-29 ENCOUNTER — Encounter (HOSPITAL_COMMUNITY): Payer: Self-pay

## 2022-07-29 DIAGNOSIS — I5032 Chronic diastolic (congestive) heart failure: Secondary | ICD-10-CM | POA: Diagnosis not present

## 2022-07-29 DIAGNOSIS — R1111 Vomiting without nausea: Secondary | ICD-10-CM | POA: Diagnosis not present

## 2022-07-29 DIAGNOSIS — R55 Syncope and collapse: Principal | ICD-10-CM | POA: Diagnosis present

## 2022-07-29 DIAGNOSIS — I63233 Cerebral infarction due to unspecified occlusion or stenosis of bilateral carotid arteries: Secondary | ICD-10-CM | POA: Diagnosis not present

## 2022-07-29 DIAGNOSIS — R4182 Altered mental status, unspecified: Secondary | ICD-10-CM | POA: Diagnosis not present

## 2022-07-29 DIAGNOSIS — R569 Unspecified convulsions: Secondary | ICD-10-CM

## 2022-07-29 DIAGNOSIS — F0390 Unspecified dementia without behavioral disturbance: Secondary | ICD-10-CM | POA: Diagnosis not present

## 2022-07-29 DIAGNOSIS — M6281 Muscle weakness (generalized): Secondary | ICD-10-CM | POA: Insufficient documentation

## 2022-07-29 DIAGNOSIS — R1110 Vomiting, unspecified: Secondary | ICD-10-CM | POA: Diagnosis present

## 2022-07-29 DIAGNOSIS — N1831 Chronic kidney disease, stage 3a: Secondary | ICD-10-CM | POA: Diagnosis not present

## 2022-07-29 DIAGNOSIS — I5030 Unspecified diastolic (congestive) heart failure: Secondary | ICD-10-CM | POA: Diagnosis not present

## 2022-07-29 DIAGNOSIS — N179 Acute kidney failure, unspecified: Secondary | ICD-10-CM | POA: Diagnosis not present

## 2022-07-29 DIAGNOSIS — I700 Atherosclerosis of aorta: Secondary | ICD-10-CM | POA: Diagnosis not present

## 2022-07-29 DIAGNOSIS — R109 Unspecified abdominal pain: Secondary | ICD-10-CM | POA: Insufficient documentation

## 2022-07-29 DIAGNOSIS — Z7982 Long term (current) use of aspirin: Secondary | ICD-10-CM | POA: Insufficient documentation

## 2022-07-29 DIAGNOSIS — I7121 Aneurysm of the ascending aorta, without rupture: Secondary | ICD-10-CM | POA: Insufficient documentation

## 2022-07-29 DIAGNOSIS — I130 Hypertensive heart and chronic kidney disease with heart failure and stage 1 through stage 4 chronic kidney disease, or unspecified chronic kidney disease: Secondary | ICD-10-CM | POA: Diagnosis not present

## 2022-07-29 DIAGNOSIS — D3502 Benign neoplasm of left adrenal gland: Secondary | ICD-10-CM | POA: Insufficient documentation

## 2022-07-29 DIAGNOSIS — J9601 Acute respiratory failure with hypoxia: Secondary | ICD-10-CM

## 2022-07-29 DIAGNOSIS — R2689 Other abnormalities of gait and mobility: Secondary | ICD-10-CM | POA: Insufficient documentation

## 2022-07-29 DIAGNOSIS — R404 Transient alteration of awareness: Secondary | ICD-10-CM | POA: Diagnosis present

## 2022-07-29 DIAGNOSIS — R0902 Hypoxemia: Secondary | ICD-10-CM | POA: Diagnosis not present

## 2022-07-29 DIAGNOSIS — L8990 Pressure ulcer of unspecified site, unspecified stage: Secondary | ICD-10-CM | POA: Insufficient documentation

## 2022-07-29 DIAGNOSIS — R262 Difficulty in walking, not elsewhere classified: Secondary | ICD-10-CM | POA: Insufficient documentation

## 2022-07-29 DIAGNOSIS — R059 Cough, unspecified: Secondary | ICD-10-CM | POA: Insufficient documentation

## 2022-07-29 DIAGNOSIS — Z79899 Other long term (current) drug therapy: Secondary | ICD-10-CM | POA: Insufficient documentation

## 2022-07-29 DIAGNOSIS — R2681 Unsteadiness on feet: Secondary | ICD-10-CM | POA: Diagnosis not present

## 2022-07-29 DIAGNOSIS — Z8616 Personal history of COVID-19: Secondary | ICD-10-CM | POA: Diagnosis not present

## 2022-07-29 DIAGNOSIS — K8020 Calculus of gallbladder without cholecystitis without obstruction: Secondary | ICD-10-CM | POA: Insufficient documentation

## 2022-07-29 DIAGNOSIS — K5730 Diverticulosis of large intestine without perforation or abscess without bleeding: Secondary | ICD-10-CM | POA: Diagnosis not present

## 2022-07-29 DIAGNOSIS — Z86718 Personal history of other venous thrombosis and embolism: Secondary | ICD-10-CM | POA: Diagnosis not present

## 2022-07-29 DIAGNOSIS — I10 Essential (primary) hypertension: Secondary | ICD-10-CM | POA: Diagnosis not present

## 2022-07-29 DIAGNOSIS — Z8673 Personal history of transient ischemic attack (TIA), and cerebral infarction without residual deficits: Secondary | ICD-10-CM | POA: Insufficient documentation

## 2022-07-29 LAB — CBC WITH DIFFERENTIAL/PLATELET
Abs Immature Granulocytes: 0.03 10*3/uL (ref 0.00–0.07)
Basophils Absolute: 0 10*3/uL (ref 0.0–0.1)
Basophils Relative: 0 %
Eosinophils Absolute: 0.1 10*3/uL (ref 0.0–0.5)
Eosinophils Relative: 1 %
HCT: 42.7 % (ref 39.0–52.0)
Hemoglobin: 13.4 g/dL (ref 13.0–17.0)
Immature Granulocytes: 0 %
Lymphocytes Relative: 4 %
Lymphs Abs: 0.3 10*3/uL — ABNORMAL LOW (ref 0.7–4.0)
MCH: 30.4 pg (ref 26.0–34.0)
MCHC: 31.4 g/dL (ref 30.0–36.0)
MCV: 96.8 fL (ref 80.0–100.0)
Monocytes Absolute: 0.6 10*3/uL (ref 0.1–1.0)
Monocytes Relative: 7 %
Neutro Abs: 6.7 10*3/uL (ref 1.7–7.7)
Neutrophils Relative %: 88 %
Platelets: 124 10*3/uL — ABNORMAL LOW (ref 150–400)
RBC: 4.41 MIL/uL (ref 4.22–5.81)
RDW: 13.4 % (ref 11.5–15.5)
WBC: 7.6 10*3/uL (ref 4.0–10.5)
nRBC: 0 % (ref 0.0–0.2)

## 2022-07-29 LAB — COMPREHENSIVE METABOLIC PANEL
ALT: 18 U/L (ref 0–44)
AST: 27 U/L (ref 15–41)
Albumin: 3.5 g/dL (ref 3.5–5.0)
Alkaline Phosphatase: 59 U/L (ref 38–126)
Anion gap: 9 (ref 5–15)
BUN: 34 mg/dL — ABNORMAL HIGH (ref 8–23)
CO2: 28 mmol/L (ref 22–32)
Calcium: 10.3 mg/dL (ref 8.9–10.3)
Chloride: 104 mmol/L (ref 98–111)
Creatinine, Ser: 1.93 mg/dL — ABNORMAL HIGH (ref 0.61–1.24)
GFR, Estimated: 31 mL/min — ABNORMAL LOW (ref >60)
Glucose, Bld: 171 mg/dL — ABNORMAL HIGH (ref 70–99)
Potassium: 4.5 mmol/L (ref 3.5–5.1)
Sodium: 141 mmol/L (ref 135–145)
Total Bilirubin: 0.5 mg/dL (ref 0.3–1.2)
Total Protein: 7.1 g/dL (ref 6.5–8.1)

## 2022-07-29 LAB — BASIC METABOLIC PANEL
Anion gap: 7 (ref 5–15)
BUN: 30 mg/dL — ABNORMAL HIGH (ref 8–23)
CO2: 26 mmol/L (ref 22–32)
Calcium: 9.8 mg/dL (ref 8.9–10.3)
Chloride: 109 mmol/L (ref 98–111)
Creatinine, Ser: 1.65 mg/dL — ABNORMAL HIGH (ref 0.61–1.24)
GFR, Estimated: 38 mL/min — ABNORMAL LOW (ref >60)
Glucose, Bld: 104 mg/dL — ABNORMAL HIGH (ref 70–99)
Potassium: 4.6 mmol/L (ref 3.5–5.1)
Sodium: 142 mmol/L (ref 135–145)

## 2022-07-29 LAB — I-STAT CHEM 8, ED
BUN: 44 mg/dL — ABNORMAL HIGH (ref 8–23)
Calcium, Ion: 1.29 mmol/L (ref 1.15–1.40)
Chloride: 106 mmol/L (ref 98–111)
Creatinine, Ser: 2 mg/dL — ABNORMAL HIGH (ref 0.61–1.24)
Glucose, Bld: 163 mg/dL — ABNORMAL HIGH (ref 70–99)
HCT: 42 % (ref 39.0–52.0)
Hemoglobin: 14.3 g/dL (ref 13.0–17.0)
Potassium: 4.7 mmol/L (ref 3.5–5.1)
Sodium: 143 mmol/L (ref 135–145)
TCO2: 33 mmol/L — ABNORMAL HIGH (ref 22–32)

## 2022-07-29 LAB — LIPASE, BLOOD: Lipase: 35 U/L (ref 11–51)

## 2022-07-29 LAB — TROPONIN I (HIGH SENSITIVITY)
Troponin I (High Sensitivity): 32 ng/L — ABNORMAL HIGH (ref <18)
Troponin I (High Sensitivity): 30 ng/L — ABNORMAL HIGH (ref <18)

## 2022-07-29 LAB — BRAIN NATRIURETIC PEPTIDE: B Natriuretic Peptide: 66.1 pg/mL (ref 0.0–100.0)

## 2022-07-29 MED ORDER — IOHEXOL 350 MG/ML SOLN
75.0000 mL | Freq: Once | INTRAVENOUS | Status: AC | PRN
  Administered 2022-07-29: 75 mL via INTRAVENOUS

## 2022-07-29 MED ORDER — ENOXAPARIN SODIUM 30 MG/0.3ML IJ SOSY
30.0000 mg | PREFILLED_SYRINGE | INTRAMUSCULAR | Status: DC
  Administered 2022-07-29 – 2022-08-01 (×4): 30 mg via SUBCUTANEOUS
  Filled 2022-07-29 (×4): qty 0.3

## 2022-07-29 MED ORDER — SODIUM CHLORIDE 0.9 % IV SOLN
3.0000 g | Freq: Two times a day (BID) | INTRAVENOUS | Status: DC
  Administered 2022-07-29: 3 g via INTRAVENOUS
  Filled 2022-07-29: qty 8

## 2022-07-29 MED ORDER — LACTATED RINGERS IV SOLN: INTRAVENOUS | Status: AC

## 2022-07-29 MED ORDER — ACETAMINOPHEN 325 MG PO TABS
650.0000 mg | ORAL_TABLET | Freq: Four times a day (QID) | ORAL | Status: DC | PRN
  Administered 2022-07-30 – 2022-07-31 (×2): 650 mg via ORAL
  Filled 2022-07-29 (×2): qty 2

## 2022-07-29 MED ORDER — LACTATED RINGERS IV BOLUS
500.0000 mL | Freq: Once | INTRAVENOUS | Status: AC
  Administered 2022-07-29: 500 mL via INTRAVENOUS

## 2022-07-29 MED ORDER — ACETAMINOPHEN 650 MG RE SUPP: 650.0000 mg | Freq: Four times a day (QID) | RECTAL | Status: DC | PRN

## 2022-07-29 MED ORDER — ONDANSETRON HCL 4 MG/2ML IJ SOLN: 4.0000 mg | Freq: Four times a day (QID) | INTRAMUSCULAR | Status: DC | PRN

## 2022-07-29 NOTE — Progress Notes (Signed)
Pharmacy Antibiotic Note  [name] is a 89 y.o. male admitted on 07/29/2022 with  asp pna .  Pharmacy has been consulted for Unasyn dosing. Noted SCr up to 2 (baseline ~1.2)  Plan: Unasyn 3gm IV q12h Will f/u renal function, micro data, and pt's clinical condition  Weight: 83.6 kg (184 lb 4.9 oz)  Temp (24hrs), Avg:98.3 F (36.8 C), Min:98 F (36.7 C), Max:98.5 F (36.9 C)  Recent Labs  Lab 07/29/22 0105 07/29/22 0116  WBC 7.6  --   CREATININE 1.93* 2.00*    Estimated Creatinine Clearance: 22.1 mL/min (A) (by C-G formula based on SCr of 2 mg/dL (H)).    No Known Allergies  Antimicrobials this admission: 5/18 Unasyn >>   Thank you for allowing pharmacy to be a part of this patient's care.  Christoper Fabian, PharmD, BCPS Please see amion for complete clinical pharmacist phone list 07/29/2022 6:30 AM

## 2022-07-29 NOTE — Procedures (Signed)
Patient Name: [name]  MRN: [id number]  Epilepsy Attending: Charlsie Quest  Referring Physician/Provider: Evlyn Kanner, MD  Date: 07/29/2022 Duration: 27.08 mins  Patient history: 89yo m with episode of unresponsive for a few seconds, drooling, and was brought to the ground at the home . EEG to evaluate for seizure  Level of alertness: Awake  AEDs during EEG study: None  Technical aspects: This EEG study was done with scalp electrodes positioned according to the 10-20 International system of electrode placement. Electrical activity was reviewed with band pass filter of 1-70Hz , sensitivity of 7 uV/mm, display speed of 63mm/sec with a 60Hz  notched filter applied as appropriate. EEG data were recorded continuously and digitally stored.  Video monitoring was available and reviewed as appropriate.  Description: The posterior dominant rhythm consists of 8 Hz activity of moderate voltage (25-35 uV) seen predominantly in posterior head regions, symmetric and reactive to eye opening and eye closing. EEG showed intermittent 2-3hz  delta slowing in left anterior  temporal region. Hyperventilation and photic stimulation were not performed.     ABNORMALITY - Intermittent slow, left anterior temporal region.   IMPRESSION: This study is suggestive of cortical dysfunction arising from left anterior temporal region likely secondary to underlying meningioma. No seizures or epileptiform discharges were seen throughout the recording.  Please note that lack of epileptiform activity during interictal EEG does not exclude the diagnosis of epilepsy.   Tashawn Greff Annabelle Harman

## 2022-07-29 NOTE — ED Notes (Signed)
Pt changed into a gown, non-skid socks applied, DNR bracelet applied to R arm, posey alarm implemented, call light acquired and in reach.

## 2022-07-29 NOTE — ED Triage Notes (Addendum)
Pt in from home via [facility] with initial callout by grandson as "cardiac arrest" witnessed by him. Per EMS, it was described as a 5-second period of unresponsiveness but he soon aroused after his grandson shook his chest. On EMS arrival, pt had an episode of vomiting and is at baseline per grandson, hx of dementia.  VS en route: 134/80 95HR 95%RA CBG 214

## 2022-07-29 NOTE — ED Notes (Signed)
ED TO INPATIENT HANDOFF REPORT  ED Nurse Name and Phone #: [phone]  S Name/Age/Gender [name] 89 y.o. male Room/Bed: 048C/048C  Code Status   Code Status: DNR  Home/SNF/Other D/C to: not sure Patient oriented to: self Is this baseline? Yes   Triage Complete: Triage complete  Chief Complaint Syncope [R55]  Triage Note Pt in from home via [facility] with initial callout by grandson as "cardiac arrest" witnessed by him. Per EMS, it was described as a 5-second period of unresponsiveness but he soon aroused after his grandson shook his chest. On EMS arrival, pt had an episode of vomiting and is at baseline per grandson, hx of dementia.  VS en route: 134/80 95HR 95%RA CBG 214   Allergies No Known Allergies  Level of Care/Admitting Diagnosis ED Disposition     ED Disposition  Admit   Condition  --   Comment  Hospital Area: [facility] [[id number]]  Level of Care: Telemetry Medical [104]  May place patient in observation at [facility] or [facility] if equivalent level of care is available:: No  Covid Evaluation: Asymptomatic - no recent exposure (last 10 days) testing not required  Diagnosis: Syncope [206001]  Admitting Physician: Dickie La [[id number]]  Attending Physician: Dickie La [[id number]]          B Medical/Surgery History Past Medical History:  Diagnosis Date   Anemia    BPH (benign prostatic hypertrophy)    TURP 05/19/13   Cerebral embolism with cerebral infarction (HCC) 12/19/2013   Cerebrovascular accident (CVA) (HCC) 04/17/2019   Chronic kidney disease    CHRONIC KIDNEY DISEASE, 2   COVID-19 03/19/2020   1/7- 03/26/2020 presentation of generalized weakness.  Treated with Decadron. CRP peaked at 2.3 and was 0.6 at discharge.  No D-dimer performed.   Difficulty hearing, right    BILATERAL HEARING LOSS - BEST TO TRY TO SPEAK INTO LEFT EAR   DVT (deep venous thrombosis) (HCC)    Frequent falls    History of DVT (deep vein thrombosis)  06/10/2013   Provoked s/p TURP. Dx per doppler 06/10/13. Complicated by hematuria while on anticoagulation s/p TURP. Initially on lovenox and coumadin, then stopped, then IVC filter placed 06/16/13. Anticoagulation restarted after hematuria stopped. Lovenox was discontinued apparently on 07/29/13 with comment on high risk for falls.   Total ~1.5 months of anticoagulation but interrupted with bleeding complication.   Hyperlipidemia    Hypertension    Incontinence of urine    SOME INCONTINENCE   Left adrenal mass (HCC) 09/11/2013   Meningioma (HCC)    Stroke (HCC)    Cerebellar, 2013; WALKS WITH WALKER, ABLE TO DRESS AND BATHE HIMSELF BUT FAMILY TRIES TO PROVIDE SUPERVISION BECAUSE OF HIS HX OF FALL AND WEAKNESS LEGS, ARMS    Thrombocytopenia (HCC)    Vitreous hemorrhage (HCC) 12/21/2013   OS 12/18/13 CT /MRI brain done for ataxia    Past Surgical History:  Procedure Laterality Date   CYSTOSCOPY N/A 06/13/2013   Procedure: CYSTOSCOPY FLEXIBLE BEDSIDE;  Surgeon: Crist Fat, MD;  Location: [facility] OR;  Service: Urology;  Laterality: N/A;   MYRINGOTOMY WITH TUBE PLACEMENT Bilateral    TRANSURETHRAL RESECTION OF PROSTATE N/A 05/19/2013   Procedure: TRANSURETHRAL RESECTION OF THE PROSTATE WITH GYRUS INSTRUMENTS;  Surgeon: Kathi Ludwig, MD;  Location: [facility]S;  Service: Urology;  Laterality: N/A;     A IV Location/Drains/Wounds Patient Lines/Drains/Airways Status     Active Line/Drains/Airways     Name Placement date  Placement time Site Days   Peripheral IV 07/29/22 22 G Right Hand 07/29/22  0620  Hand  less than 1            Intake/Output Last 24 hours  Intake/Output Summary (Last 24 hours) at 07/29/2022 1057 Last data filed at 07/29/2022 0843 Gross per 24 hour  Intake 600 ml  Output --  Net 600 ml    Labs/Imaging Results for orders placed or performed during the hospital encounter of 07/29/22 (from the past 48 hour(s))  Comprehensive metabolic panel     Status: Abnormal    Collection Time: 07/29/22  1:05 AM  Result Value Ref Range   Sodium 141 135 - 145 mmol/L   Potassium 4.5 3.5 - 5.1 mmol/L   Chloride 104 98 - 111 mmol/L   CO2 28 22 - 32 mmol/L   Glucose, Bld 171 (H) 70 - 99 mg/dL    Comment: Glucose reference range applies only to samples taken after fasting for at least 8 hours.   BUN 34 (H) 8 - 23 mg/dL   Creatinine, Ser 1.61 (H) 0.61 - 1.24 mg/dL   Calcium 09.6 8.9 - 04.5 mg/dL   Total Protein 7.1 6.5 - 8.1 g/dL   Albumin 3.5 3.5 - 5.0 g/dL   AST 27 15 - 41 U/L   ALT 18 0 - 44 U/L   Alkaline Phosphatase 59 38 - 126 U/L   Total Bilirubin 0.5 0.3 - 1.2 mg/dL   GFR, Estimated 31 (L) >60 mL/min    Comment: (NOTE) Calculated using the CKD-EPI Creatinine Equation (2021)    Anion gap 9 5 - 15    Comment: Performed at [facility], [address], [place], [place] [address]  CBC with Differential     Status: Abnormal   Collection Time: 07/29/22  1:05 AM  Result Value Ref Range   WBC 7.6 4.0 - 10.5 K/uL   RBC 4.41 4.22 - 5.81 MIL/uL   Hemoglobin 13.4 13.0 - 17.0 g/dL   HCT 19.1 47.8 - 29.5 %   MCV 96.8 80.0 - 100.0 fL   MCH 30.4 26.0 - 34.0 pg   MCHC 31.4 30.0 - 36.0 g/dL   RDW 62.1 30.8 - 65.7 %   Platelets 124 (L) 150 - 400 K/uL    Comment: REPEATED TO VERIFY   nRBC 0.0 0.0 - 0.2 %   Neutrophils Relative % 88 %   Neutro Abs 6.7 1.7 - 7.7 K/uL   Lymphocytes Relative 4 %   Lymphs Abs 0.3 (L) 0.7 - 4.0 K/uL   Monocytes Relative 7 %   Monocytes Absolute 0.6 0.1 - 1.0 K/uL   Eosinophils Relative 1 %   Eosinophils Absolute 0.1 0.0 - 0.5 K/uL   Basophils Relative 0 %   Basophils Absolute 0.0 0.0 - 0.1 K/uL   Immature Granulocytes 0 %   Abs Immature Granulocytes 0.03 0.00 - 0.07 K/uL    Comment: Performed at [facility], [address] [place], [place] [address]  Troponin I (High Sensitivity)     Status: Abnormal   Collection Time: 07/29/22  1:05 AM  Result Value Ref Range   Troponin I (High Sensitivity) 32 (H) <18 ng/L     Comment: (NOTE) Elevated high sensitivity troponin I (hsTnI) values and significant  changes across serial measurements may suggest ACS but many other  chronic and acute conditions are known to elevate hsTnI results.  Refer to the "Links" section for chest pain algorithms and  additional  guidance. Performed at [facility], [address] [place], [place] [address]   Lipase, blood     Status: None   Collection Time: 07/29/22  1:05 AM  Result Value Ref Range   Lipase 35 11 - 51 U/L    Comment: Performed at [facility], [address] [place], [place] [address]  I-stat chem 8, ED     Status: Abnormal   Collection Time: 07/29/22  1:16 AM  Result Value Ref Range   Sodium 143 135 - 145 mmol/L   Potassium 4.7 3.5 - 5.1 mmol/L   Chloride 106 98 - 111 mmol/L   BUN 44 (H) 8 - 23 mg/dL   Creatinine, Ser 0.98 (H) 0.61 - 1.24 mg/dL   Glucose, Bld 119 (H) 70 - 99 mg/dL    Comment: Glucose reference range applies only to samples taken after fasting for at least 8 hours.   Calcium, Ion 1.29 1.15 - 1.40 mmol/L   TCO2 33 (H) 22 - 32 mmol/L   Hemoglobin 14.3 13.0 - 17.0 g/dL   HCT 14.7 82.9 - 56.2 %  Troponin I (High Sensitivity)     Status: Abnormal   Collection Time: 07/29/22  3:38 AM  Result Value Ref Range   Troponin I (High Sensitivity) 30 (H) <18 ng/L    Comment: (NOTE) Elevated high sensitivity troponin I (hsTnI) values and significant  changes across serial measurements may suggest ACS but many other  chronic and acute conditions are known to elevate hsTnI results.  Refer to the "Links" section for chest pain algorithms and additional  guidance. Performed at [facility], [address] [place], [place] [address]   Brain natriuretic peptide     Status: None   Collection Time: 07/29/22  3:38 AM  Result Value Ref Range   B Natriuretic Peptide 66.1 0.0 - 100.0 pg/mL    Comment: Performed at [facility], [address] [place], [place] [address]   CT  Head Wo Contrast  Result Date: 07/29/2022 CLINICAL DATA:  Initial evaluation for mental status change, unknown cause. EXAM: CT HEAD WITHOUT CONTRAST TECHNIQUE: Contiguous axial images were obtained from the base of the skull through the vertex without intravenous contrast. RADIATION DOSE REDUCTION: This exam was performed according to the departmental dose-optimization program which includes automated exposure control, adjustment of the mA and/or kV according to patient size and/or use of iterative reconstruction technique. COMPARISON:  Prior study from 04/26/2021. FINDINGS: Brain: Moderately advanced cerebral atrophy with advanced chronic microvascular ischemic disease. Chronic ischemic changes noted involving the right frontal and parietal lobes. Remote right greater than left cerebellar infarcts. There is new and/or increased parenchymal hypodensity involving the left cerebellum (series 3, image 9), age indeterminate, but could reflect acute to subacute ischemic changes. Superimposed subtle hyperdensity at this location could reflect petechial blood products. No other visible large vessel territory infarct. No acute intracranial hemorrhage. 2.3 cm meningioma at the left supraorbital region noted. Probable mild localized edema, similar. No other mass lesion or midline shift. Diffuse ventricular prominence related to global parenchymal volume loss of hydrocephalus. No extra-axial fluid collection. Vascular: No convincing abnormal hyperdense vessel. Scattered calcified atherosclerosis present at the skull base. Skull: Scalp soft tissues within normal limits.  Calvarium intact. Sinuses/Orbits: Globes and orbital soft tissues within normal limits. Paranasal sinuses are clear. No mastoid effusion. Other: None. IMPRESSION: 1. New parenchymal hypodensity involving the left cerebellum, age indeterminate, but could reflect evolving acute to subacute  ischemic changes. Possible superimposed petechial blood products.  Correlation with dedicated MRI could be performed for further evaluation as warranted. 2. No other acute intracranial abnormality. 3. Atrophy with advanced chronic microvascular ischemic disease and multiple remote infarcts as above. 4. 2.3 cm left anterior cranial fossa meningioma, relatively stable. Electronically Signed   By: Rise Mu M.D.   On: 07/29/2022 02:34   CT ABDOMEN PELVIS WO CONTRAST  Result Date: 07/29/2022 CLINICAL DATA:  Abdominal pain, acute, nonlocalized EXAM: CT ABDOMEN AND PELVIS WITHOUT CONTRAST TECHNIQUE: Multidetector CT imaging of the abdomen and pelvis was performed following the standard protocol without IV contrast. RADIATION DOSE REDUCTION: This exam was performed according to the departmental dose-optimization program which includes automated exposure control, adjustment of the mA and/or kV according to patient size and/or use of iterative reconstruction technique. COMPARISON:  07/18/2020 FINDINGS: Lower chest: No acute abnormality. Hepatobiliary: Cholelithiasis without pericholecystic inflammatory change. Multiple simple cysts are seen scattered throughout the liver. These appear unchanged from prior examination. No focal intrahepatic mass identified on this noncontrast examination. No intra or extrahepatic biliary ductal dilation. Pancreas: Unremarkable Spleen: Unremarkable Adrenals/Urinary Tract: Stable 2.3 cm left adrenal adenoma, definitively characterized on MRI examination of 09/15/2013. No follow-up imaging is recommended for this lesion. The right adrenal gland is unremarkable. The kidneys are normal in position. Moderate bilateral renal cortical atrophy. Multiple simple cortical cysts are seen within the kidneys bilaterally for which no follow-up imaging is recommended. No intrarenal or ureteral calculi. No hydronephrosis. Bladder unremarkable. Stomach/Bowel: Moderate to severe pancolonic diverticulosis, most severe within the sigmoid colon. Redundant sigmoid  colon noted. The stomach, small bowel, and large bowel are otherwise unremarkable. No evidence of obstruction or focal inflammation. Appendix normal. No free intraperitoneal gas or fluid. 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 No abdominal aortic aneurysm. Inferior vena cava filter is seen in place within the infrarenal cava. No pathologic adenopathy within the abdomen and pelvis. Reproductive: Prostate is unremarkable. Other: Tiny fat containing umbilical hernia Musculoskeletal: Degenerative changes are seen within the visualized thoracolumbar spine. No acute bone abnormality. No lytic or blastic bone lesion. IMPRESSION: 1. No acute intra-abdominal pathology identified. 2. Cholelithiasis. 3. Stable 2.3 cm left adrenal adenoma. No follow-up imaging is recommended. 4. Moderate to severe pancolonic diverticulosis without superimposed acute inflammatory change. 5. 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 Aortic Atherosclerosis (ICD10-I70.0). Electronically Signed   By: Helyn Numbers M.D.   On: 07/29/2022 02:22   DG Chest Port 1 View  Result Date: 07/29/2022 CLINICAL DATA:  Syncope EXAM: PORTABLE CHEST 1 VIEW COMPARISON:  Chest radiographs 10/28/2021 FINDINGS: Low lung volumes accentuate cardiomediastinal silhouette and pulmonary vascularity. There is rightward deviation of the trachea with ill-defined thickening of the right paratracheal stripe. This appears similar to prior and is likely due to a combination of aortic aneurysm and dilated SVC given appearance on CT chest 04/26/2021. No focal consolidation, pleural effusion, or pneumothorax. No displaced rib fractures. IMPRESSION: No acute cardiopulmonary abnormalities. Calcified tortuous aorta. Electronically Signed   By: Minerva Fester M.D.   On: 07/29/2022 01:19    Pending Labs Unresulted Labs (From  admission, onward)     Start     Ordered   07/29/22 0100  Urinalysis, Routine w reflex microscopic -Urine, Clean Catch  Once,   URGENT       Question:  Specimen Source  Answer:  Urine, Clean Catch   07/29/22 0100  Vitals/Pain Today's Vitals   07/29/22 0900 07/29/22 0913 07/29/22 0939 07/29/22 0944  BP: (!) 144/79     Pulse: 77  95   Resp: (!) 24  19   Temp:    98 F (36.7 C)  TempSrc:      SpO2: 91% (!) 84% 95%   Weight:      PainSc:        Isolation Precautions No active isolations  Medications Medications  lactated ringers infusion ( Intravenous New Bag/Given 07/29/22 0620)  ondansetron (ZOFRAN) injection 4 mg (has no administration in time range)  enoxaparin (LOVENOX) injection 30 mg (30 mg Subcutaneous Given 07/29/22 0655)  acetaminophen (TYLENOL) tablet 650 mg (has no administration in time range)    Or  acetaminophen (TYLENOL) suppository 650 mg (has no administration in time range)  lactated ringers bolus 500 mL (0 mLs Intravenous Stopped 07/29/22 0843)    Mobility unsure     Focused Assessments Cardiac Assessment Handoff:    Lab Results  Component Value Date   CKTOTAL 66 12/19/2013   CKMB 4.3 (H) 07/20/2011   TROPONINI <0.30 07/09/2013   Lab Results  Component Value Date   DDIMER 3.77 (H) 08/15/2013   Does the Patient currently have chest pain? No   , Neuro Assessment Handoff:  Swallow screen pass?  Pt NPO at this time waiting on speech eval         Neuro Assessment: Exceptions to [facility] Neuro Checks:      Has TPA been given? No If patient is a Neuro Trauma and patient is going to OR before floor call report to 4N Charge nurse: [phone] or [phone]   R Recommendations: See Admitting Provider Note  Report given to:   Additional Notes: Pt lives w/ grandson and had an unresponsive episode and vomited. Ox1 at baseline, sometimes oriented to place. Hx CVA w/ slurred speech and L deficits. New O2 requirement of 2L via [place].  Pt was 84% RA per report. Very HOH and has a L hearing aid. DNR w/ golden ticket at bedside. He is currently in MRI.

## 2022-07-29 NOTE — Consult Note (Signed)
Neurology Consultation    Reason for Consult: AMS  CC: Altered mental status   HISTORY OF PRESENT ILLNESS   HPI   [name] is a 89 y.o. male with a past medical history of vascular dementia, multiple strokes w/ cerebellar, occipital and frontal lobe involvement, with concern for post-stroke focal seizure(s) in 2021, hypertension, hyperlipidemia, CKD3a presenting with an episode of unresponsiveness followed by emesis. [name] states that [name] was in his wheelchair ready for bed when grandson went to the kitchen for a moment. When he came back [name] was slumped over unresponsive.  This period lasted for about 2 to 3 minutes, he called EMS and when they arrived [name] proceeded to vomit and then began to come around. [name] is at the bedside and states that the patient is at his baseline this morning.  He is nonambulatory, uses a wheelchair and needs assistance with all ADLs.  MRI does show a punctate DWI signal abnormality in the posterior left superior temporal gyrus, representing a possible acute cortical infarct per the Radiology report. On review of the images by Neurology, this appears more consistent with artifact.     ROS: Pertinent items noted in HPI and remainder of comprehensive ROS otherwise negative.   PAST MEDICAL HISTORY    Past Medical History:  Past Medical History:  Diagnosis Date   Anemia    BPH (benign prostatic hypertrophy)    TURP 05/19/13   Cerebral embolism with cerebral infarction (HCC) 12/19/2013   Cerebrovascular accident (CVA) (HCC) 04/17/2019   Chronic kidney disease    CHRONIC KIDNEY DISEASE, 2   COVID-19 03/19/2020   1/7- 03/26/2020 presentation of generalized weakness.  Treated with Decadron. CRP peaked at 2.3 and was 0.6 at discharge.  No D-dimer performed.   Difficulty hearing, right    BILATERAL HEARING LOSS - BEST TO TRY TO SPEAK INTO LEFT EAR   DVT (deep venous thrombosis) (HCC)    Frequent falls    History of DVT (deep vein  thrombosis) 06/10/2013   Provoked s/p TURP. Dx per doppler 06/10/13. Complicated by hematuria while on anticoagulation s/p TURP. Initially on lovenox and coumadin, then stopped, then IVC filter placed 06/16/13. Anticoagulation restarted after hematuria stopped. Lovenox was discontinued apparently on 07/29/13 with comment on high risk for falls.   Total ~1.5 months of anticoagulation but interrupted with bleeding complication.   Hyperlipidemia    Hypertension    Incontinence of urine    SOME INCONTINENCE   Left adrenal mass (HCC) 09/11/2013   Meningioma (HCC)    Stroke (HCC)    Cerebellar, 2013; WALKS WITH WALKER, ABLE TO DRESS AND BATHE HIMSELF BUT FAMILY TRIES TO PROVIDE SUPERVISION BECAUSE OF HIS HX OF FALL AND WEAKNESS LEGS, ARMS    Thrombocytopenia (HCC)    Vitreous hemorrhage (HCC) 12/21/2013   OS 12/18/13 CT /MRI brain done for ataxia     No family history on file. Family History  Problem Relation Age of Onset   Diabetes Mother    Heart disease Mother    Hypertension Mother    Heart disease Father    Hypertension Father    Hypertension Daughter    Thyroid disease Daughter     Allergies:  No Known Allergies  Social History:   reports that he has never smoked. He has never used smokeless tobacco. He reports that he does not drink alcohol and does not use drugs.    Medications Medications Prior to Admission  Medication Sig Dispense Refill  amLODipine (NORVASC) 5 MG tablet Take 1 tablet (5 mg total) by mouth daily. 90 tablet 3   aspirin 81 MG EC tablet Take 1 tablet (81 mg total) by mouth daily. 30 tablet 0   furosemide (LASIX) 40 MG tablet Take 1 tablet (40 mg total) by mouth every other day. 30 tablet 11   triamcinolone ointment (KENALOG) 0.1 % Apply 1 Application topically 2 (two) times daily as needed (For leg rash). 80 g 0    EXAMINATION    Current vital signs:    07/29/2022   12:01 PM 07/29/2022   12:00 PM 07/29/2022   11:28 AM  Vitals with BMI  Height  5\' 8"     Weight  187 lbs 10 oz   BMI  28.53   Systolic 147  144  Diastolic 81  86  Pulse 64  66    Examination:  GENERAL: Awake, alert in NAD HEENT: - Normocephalic and atraumatic, dry mm, no lymphadenopathy, no Thyromegally LUNGS - Regular, unlabored respirations CV - S1S2 RRR, equal pulses bilaterally. ABDOMEN - Soft, nontender, nondistended with normoactive BS Ext: warm, well perfused, intact peripheral pulses, no pedal edema Integumentary:  Skin intact on clothed exam  NEURO:  Mental Status: Alert and oriented to self, able to identify grandson, states he is at [facility], however he is unable to tell me the date or the year.  This is normal for him. Difficulty with multistep commands. Language: speech is dysarthric, but at baseline per family.  Intact naming, repetition, fluency, and comprehension. Cranial Nerves:  II: PERRL. Visual fields full to confrontation.  III, IV, VI: EOM intact. Eyelids elevate symmetrically. Blinks to threat.  V: Sensation intact V1-3 symmetrically  VII: no facial asymmetry   VIII: Hearing aids in place bilaterally IX, X: Palate elevates symmetrically. Phonation is normal.  DG:LOVFIEPP shrug 5/5 and symmetrical  XII: tongue is midline without fasciculations. Motor:  RUE: 5/5     LUE: 5/5 RLE: 3+/5   LLE: 3+/5 Tone: is normal and bulk is normal Sensation- Intact to light touch bilaterally Coordination: FTN intact bilaterally, no ataxia in BLE., no abnormal movements  Gait- deferred    LABS   I have reviewed labs in epic and the results pertinent to this consultation are:   Lab Results  Component Value Date   LDLCALC 121 (H) 01/26/2022   Lab Results  Component Value Date   ALT 18 07/29/2022   AST 27 07/29/2022   ALKPHOS 59 07/29/2022   BILITOT 0.5 07/29/2022   Lab Results  Component Value Date   HGBA1C 5.5 11/03/2018   Lab Results  Component Value Date   WBC 7.6 07/29/2022   HGB 14.3 07/29/2022   HCT 42.0 07/29/2022   MCV  96.8 07/29/2022   PLT 124 (L) 07/29/2022   No results found for: "VITAMINB12" No results found for: "FOLATE" Lab Results  Component Value Date   NA 143 07/29/2022   K 4.7 07/29/2022   CL 106 07/29/2022   CO2 28 07/29/2022     DIAGNOSTIC IMAGING/PROCEDURES   I have reviewed the images obtained:, as below    CT-head - New parenchymal hypodensity involving the left cerebellum, age indeterminate, but could reflect evolving acute to subacute ischemic changes. Possible superimposed petechial blood products  MRI brain - Possible punctate acute cortical infarct in the posterior left superior temporal gyrus    ASSESSMENT/PLAN    Assessment: 89 y.o. male with a past medical history of vascular dementia, multiple strokes w/ cerebellar,  occipital and frontal lobe involvement, with concern for post-stroke focal seizure(s) in 2021, hypertension, hyperlipidemia, CKD3a presenting with an episode of unresponsiveness at home followed by emesis.  MRI revealed a possible small acute cortical infarct in the posterior left superior temporal gyrus. Full stroke workup as well as an EEG are indicated. He is currently on aspirin 81 mg daily.  - Exam reveals cognitive impairment and right sided weakness.  - MRI brain: Possible punctate acute cortical infarct in the posterior left superior temporal gyrus. No associated hemorrhage or mass effect. Underlying very advanced chronic ischemic disease. Chronic left anterior clinoid region Meningioma; regional mass effect and possible associated chronic cerebral edema are stable since 2021. - On review of the MRI images by Neurology, the punctate left temporal lobe DWI signal abnormality appears more consistent with artifact.   - EEG:  Intermittent slow, left anterior temporal region. This study is suggestive of cortical dysfunction arising from left anterior temporal region likely secondary to underlying meningioma. No seizures or epileptiform discharges were seen  throughout the recording.  Impression: Acute transient cognitive fluctuation in the setting of dementia versus TIA versus unwitnessed seizure with postictal state versus syncope   Recommendations: - HgbA1c, fasting lipid panel - Frequent neuro checks - Echocardiogram - Risk factor modification - Telemetry monitoring - PT consult, OT consult, Speech consult - Stroke team to follow    Patient seen and examined by NP/APP with MD.  Elmer Picker, DNP, FNP-BC [facility] Pager: [phone]  I have seen and examined the patient. I have formulated the assessment and recommendations.  89 y.o. male with a past medical history of vascular dementia, multiple strokes w/ cerebellar, occipital and frontal lobe involvement, with concern for post-stroke focal seizure(s) in 2021, hypertension, hyperlipidemia, CKD3a presenting with an episode of unresponsiveness at home followed by emesis.  MRI revealed a possible small acute cortical infarct in the posterior left superior temporal gyrus. Full stroke workup as well as an EEG are indicated. He is currently on aspirin 81 mg daily. Exam reveals cognitive impairment and right sided weakness. MRI with equivocal punctate acute infarction in the left temporal lobe, but felt on review by Neurology to most likely represent artifact. Recommendations as above.  Electronically signed: Dr. Caryl Pina

## 2022-07-29 NOTE — ED Notes (Signed)
Pt transported to MRI 

## 2022-07-29 NOTE — Progress Notes (Signed)
PT Cancellation Note  Patient Details Name: [name] MRN: [id number] DOB: 02-27-1925   Cancelled Treatment:    Reason Eval/Treat Not Completed: Other (comment).  Gone to MRI then to another procedure.  Retry at another time.   [name] 07/29/2022, 11:50 AM  Samul Dada, PT PhD Acute Rehab Dept. Number: [facility] [id number] and [facility] [phone]

## 2022-07-29 NOTE — ED Notes (Signed)
Pt lives at home with grandson. Suddenly slumped over and started to drool. Then pt vomited and felt better. Per EMS pt has a baseline of confusion, slurred speech, and L sided deficits

## 2022-07-29 NOTE — Progress Notes (Signed)
SLP Cancellation Note  Patient Details Name: TAYM MELOY MRN: [id number] DOB: 06-18-1924   Cancelled treatment:        Attempted to see pt for swallowing assessment.  Pt off floor for MRI at time of attempt.  SLP will reattempt as schedule permits.   Kerrie Pleasure, MA, CCC-SLP Acute Rehabilitation Services Office: [phone] 07/29/2022, 10:56 AM

## 2022-07-29 NOTE — Progress Notes (Signed)
EEG complete - results pending 

## 2022-07-29 NOTE — Hospital Course (Addendum)
Principal Problem:   Episode of unresponsiveness Active Problems:   Syncope   AKI (acute kidney injury) (HCC)   Pressure injury of skin  Resolved Problems:   * No resolved hospital problems. *  Consults: - neurology  Procedures: - spot eeg  Follow-up items:***  Unresponsive episode Presented after brief episode of unresponsiveness followed by emesis. History notable for cerebral infarct with associated focal seizure and chronic left clinoidal meningioma. Worked up for new cerebral infarct and seizure. MRI showed signal abnormality that is equivocal for stroke, could be artifactual. EEG showed cortical irritability on left, perhaps from meningioma. Differential includes cardiac syncope, telemetry was notable for sinus arrhythmia without prolonged sinus pauses or high grade AV node block. Further workup and treatment for underlying cause should be guided by patient preference for comfort-oriented care rather than intense or invasive monitoring and treatment.   AKI on CKD 3a Creatinine around 2 on admission, up from baseline. BUN/creatinine ratio and clinical presentation suggestive of pre-renal azotemia. Treated with IV fluids to good effect.

## 2022-07-29 NOTE — ED Provider Notes (Signed)
[facility] AT [facility] Provider Note   CSN: [id number] Arrival date & time: 07/29/22  0042     History  Chief Complaint  Patient presents with   Emesis    [name] is a 89 y.o. male.  The history is provided by the patient and medical records.  Emesis [name] is a 89 y.o. male who presents to the Emergency Department complaining of vomiting.  Level 5 caveat due to history of dementia.  Patient states that he has been vomiting, symptoms started today.  No associated pain. Per EMS report he had a 5-second period of unresponsiveness but awoken when grandson shook his chest.  He does live at home with his grandson.  Per report he had an episode of emesis at their arrival.    Additional history available from patient's grandson over the phone several hours after patient's initial ED evaluation. Lucila Maine states that patient was sitting in the wheelchair and he had his head drooped and had drooling and was unresponsive for up to 5 minutes.  This started around 1115.  The patient then had multiple episodes of emesis.  Grandson also reports that he has had a wet cough for the last 2 days but has not been complaining of anything else.  At baseline he is described as difficult to understand when he speaks until you get used to his speech.  He is normally confused to time.  He has a history of dementia, CKD, DVT, meningioma, CVA    Home Medications Prior to Admission medications   Medication Sig Start Date End Date Taking? Authorizing Provider  amLODipine (NORVASC) 5 MG tablet Take 1 tablet (5 mg total) by mouth daily. 02/01/22 02/01/23  Gust Rung, DO  aspirin 81 MG EC tablet Take 1 tablet (81 mg total) by mouth daily. 07/23/20   Dolan Amen, MD  furosemide (LASIX) 40 MG tablet Take 1 tablet (40 mg total) by mouth every other day. 05/30/22 05/30/23  Belva Agee, MD  triamcinolone ointment (KENALOG) 0.1 % Apply 1 Application  topically 2 (two) times daily as needed (For leg rash). 01/26/22   Gust Rung, DO      Allergies    Patient has no known allergies.    Review of Systems   Review of Systems  Gastrointestinal:  Positive for vomiting.  All other systems reviewed and are negative.   Physical Exam Updated Vital Signs BP 120/73 (BP Location: Right Arm)   Pulse 85   Temp 98.5 F (36.9 C) (Oral)   Resp 19   Wt 83.6 kg   SpO2 98%   BMI 27.22 kg/m  Physical Exam Vitals and nursing note reviewed.  Constitutional:      Appearance: He is well-developed.  HENT:     Head: Normocephalic and atraumatic.  Cardiovascular:     Rate and Rhythm: Regular rhythm. Tachycardia present.     Heart sounds: No murmur heard. Pulmonary:     Effort: Pulmonary effort is normal. No respiratory distress.     Comments: Occasional crackles in the bases bilaterally Abdominal:     Palpations: Abdomen is soft.     Tenderness: There is no abdominal tenderness. There is no guarding or rebound.  Musculoskeletal:        General: No tenderness.  Skin:    General: Skin is warm and dry.  Neurological:     Mental Status: He is alert.     Comments: Hard of hearing.  Dysarthric speech.  Weakly moves all extremities.  Oriented to person, place.  Disoriented to time.  Psychiatric:        Behavior: Behavior normal.     ED Results / Procedures / Treatments   Labs (all labs ordered are listed, but only abnormal results are displayed) Labs Reviewed  COMPREHENSIVE METABOLIC PANEL - Abnormal; Notable for the following components:      Result Value   Glucose, Bld 171 (*)    BUN 34 (*)    Creatinine, Ser 1.93 (*)    GFR, Estimated 31 (*)    All other components within normal limits  CBC WITH DIFFERENTIAL/PLATELET - Abnormal; Notable for the following components:   Platelets 124 (*)    Lymphs Abs 0.3 (*)    All other components within normal limits  I-STAT CHEM 8, ED - Abnormal; Notable for the following components:   BUN  44 (*)    Creatinine, Ser 2.00 (*)    Glucose, Bld 163 (*)    TCO2 33 (*)    All other components within normal limits  TROPONIN I (HIGH SENSITIVITY) - Abnormal; Notable for the following components:   Troponin I (High Sensitivity) 32 (*)    All other components within normal limits  TROPONIN I (HIGH SENSITIVITY) - Abnormal; Notable for the following components:   Troponin I (High Sensitivity) 30 (*)    All other components within normal limits  LIPASE, BLOOD  BRAIN NATRIURETIC PEPTIDE  URINALYSIS, ROUTINE W REFLEX MICROSCOPIC    EKG EKG Interpretation  Date/Time:  Saturday Jul 29 2022 01:14:35 EDT Ventricular Rate:  99 PR Interval:  219 QRS Duration: 118 QT Interval:  324 QTC Calculation: 416 R Axis:   -29 Text Interpretation: Sinus rhythm Prolonged PR interval Left ventricular hypertrophy Confirmed by Tilden Fossa [phone]) on 07/29/2022 1:33:01 AM  Radiology CT Head Wo Contrast  Result Date: 07/29/2022 CLINICAL DATA:  Initial evaluation for mental status change, unknown cause. EXAM: CT HEAD WITHOUT CONTRAST TECHNIQUE: Contiguous axial images were obtained from the base of the skull through the vertex without intravenous contrast. RADIATION DOSE REDUCTION: This exam was performed according to the departmental dose-optimization program which includes automated exposure control, adjustment of the mA and/or kV according to patient size and/or use of iterative reconstruction technique. COMPARISON:  Prior study from 04/26/2021. FINDINGS: Brain: Moderately advanced cerebral atrophy with advanced chronic microvascular ischemic disease. Chronic ischemic changes noted involving the right frontal and parietal lobes. Remote right greater than left cerebellar infarcts. There is new and/or increased parenchymal hypodensity involving the left cerebellum (series 3, image 9), age indeterminate, but could reflect acute to subacute ischemic changes. Superimposed subtle hyperdensity at this location  could reflect petechial blood products. No other visible large vessel territory infarct. No acute intracranial hemorrhage. 2.3 cm meningioma at the left supraorbital region noted. Probable mild localized edema, similar. No other mass lesion or midline shift. Diffuse ventricular prominence related to global parenchymal volume loss of hydrocephalus. No extra-axial fluid collection. Vascular: No convincing abnormal hyperdense vessel. Scattered calcified atherosclerosis present at the skull base. Skull: Scalp soft tissues within normal limits.  Calvarium intact. Sinuses/Orbits: Globes and orbital soft tissues within normal limits. Paranasal sinuses are clear. No mastoid effusion. Other: None. IMPRESSION: 1. New parenchymal hypodensity involving the left cerebellum, age indeterminate, but could reflect evolving acute to subacute ischemic changes. Possible superimposed petechial blood products. Correlation with dedicated MRI could be performed for further evaluation as warranted. 2. No other acute intracranial abnormality. 3. Atrophy with advanced chronic microvascular  ischemic disease and multiple remote infarcts as above. 4. 2.3 cm left anterior cranial fossa meningioma, relatively stable. Electronically Signed   By: Rise Mu M.D.   On: 07/29/2022 02:34   CT ABDOMEN PELVIS WO CONTRAST  Result Date: 07/29/2022 CLINICAL DATA:  Abdominal pain, acute, nonlocalized EXAM: CT ABDOMEN AND PELVIS WITHOUT CONTRAST TECHNIQUE: Multidetector CT imaging of the abdomen and pelvis was performed following the standard protocol without IV contrast. RADIATION DOSE REDUCTION: This exam was performed according to the departmental dose-optimization program which includes automated exposure control, adjustment of the mA and/or kV according to patient size and/or use of iterative reconstruction technique. COMPARISON:  07/18/2020 FINDINGS: Lower chest: No acute abnormality. Hepatobiliary: Cholelithiasis without pericholecystic  inflammatory change. Multiple simple cysts are seen scattered throughout the liver. These appear unchanged from prior examination. No focal intrahepatic mass identified on this noncontrast examination. No intra or extrahepatic biliary ductal dilation. Pancreas: Unremarkable Spleen: Unremarkable Adrenals/Urinary Tract: Stable 2.3 cm left adrenal adenoma, definitively characterized on MRI examination of 09/15/2013. No follow-up imaging is recommended for this lesion. The right adrenal gland is unremarkable. The kidneys are normal in position. Moderate bilateral renal cortical atrophy. Multiple simple cortical cysts are seen within the kidneys bilaterally for which no follow-up imaging is recommended. No intrarenal or ureteral calculi. No hydronephrosis. Bladder unremarkable. Stomach/Bowel: Moderate to severe pancolonic diverticulosis, most severe within the sigmoid colon. Redundant sigmoid colon noted. The stomach, small bowel, and large bowel are otherwise unremarkable. No evidence of obstruction or focal inflammation. Appendix normal. No free intraperitoneal gas or fluid. 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 No abdominal aortic aneurysm. Inferior vena cava filter is seen in place within the infrarenal cava. No pathologic adenopathy within the abdomen and pelvis. Reproductive: Prostate is unremarkable. Other: Tiny fat containing umbilical hernia Musculoskeletal: Degenerative changes are seen within the visualized thoracolumbar spine. No acute bone abnormality. No lytic or blastic bone lesion. IMPRESSION: 1. No acute intra-abdominal pathology identified. 2. Cholelithiasis. 3. Stable 2.3 cm left adrenal adenoma. No follow-up imaging is recommended. 4. Moderate to severe pancolonic diverticulosis without superimposed acute inflammatory change. 5. 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 Aortic Atherosclerosis (ICD10-I70.0). Electronically Signed   By: Helyn Numbers M.D.   On: 07/29/2022 02:22   DG Chest Port 1 View  Result Date: 07/29/2022 CLINICAL DATA:  Syncope EXAM: PORTABLE CHEST 1 VIEW COMPARISON:  Chest radiographs 10/28/2021 FINDINGS: Low lung volumes accentuate cardiomediastinal silhouette and pulmonary vascularity. There is rightward deviation of the trachea with ill-defined thickening of the right paratracheal stripe. This appears similar to prior and is likely due to a combination of aortic aneurysm and dilated SVC given appearance on CT chest 04/26/2021. No focal consolidation, pleural effusion, or pneumothorax. No displaced rib fractures. IMPRESSION: No acute cardiopulmonary abnormalities. Calcified tortuous aorta. Electronically Signed   By: Minerva Fester M.D.   On: 07/29/2022 01:19    Procedures Procedures    Medications Ordered in ED Medications  lactated ringers infusion (has no administration in time range)    ED Course/ Medical Decision Making/ A&P                             Medical Decision Making Amount and/or Complexity of Data Reviewed Labs: ordered. Radiology: ordered.  Risk Prescription drug management. Decision regarding hospitalization.   Patient with history of dementia, CKD, CVA, DVT status  post IVC filter not on anticoagulation here for evaluation following syncopal event followed by emesis.  Patient does report vomiting, denies any pain.  He does have dementia and some confusion, which limits history taking.  Patient does have AKI on his labs.  CT head is concerning for possible subacute infarct-last known well is not available.  Troponins are minimally elevated but flat and similar when compared to priors.  Patient also has episodes of hypoxia during his ED stay and was placed on supplemental oxygen.  There was delay in obtaining collateral information from family members.  After multiple calls eventually  able to contact grandson, who agrees with admission for ongoing workup.  Given his syncopal episode, new hypoxia and need for further workup medicine consulted for admission for ongoing care.        Final Clinical Impression(s) / ED Diagnoses Final diagnoses:  Syncope, unspecified syncope type  Hypoxia  AKI (acute kidney injury) [facility])    Rx / DC Orders ED Discharge Orders     None         Tilden Fossa, MD 07/29/22 [phone]

## 2022-07-29 NOTE — H&P (Signed)
Date: 07/29/2022               Patient Name:  [name] MRN: [id number]  DOB: [birth year] Age / Sex: 89 y.o., male   PCP: Gust Rung, DO         Medical Service: Internal Medicine Teaching Service         Attending Physician: Dr. Dickie La, MD    First Contact: Marrianne Mood, MD Pager: [phone]  Second Contact: Evlyn Kanner, MD Pager: Turner Daniels [phone]       After Hours (After 5p/  First Contact Pager: [phone]  weekends / holidays): Second Contact Pager: [phone]   SUBJECTIVE   Chief Complaint: vomiting  History of Present Illness: Patient is a 89 year old male with a past medical history that includes stroke, hypertension, hyperlipidemia, CKD III, HFpEF, vascular dementia, meningioma, and BPH who presents with vomiting and syncope.  Patient's grandson reports that at around 10:45 PM, he found patient sitting in his chair watching TV.  He stepped away for just a minute or so and when he came back his grandfather's head was hanging down.  He called his name and he said "huh" but was not fully responsive.  He states that he was breathing fast and shallow and eventually stopped breathing for a moment.  He tried to transition him from his chair to the ground at which point his grandfather started to drool.  Son states that he remained this way for at least several minutes before EMS got there.  Patient also apparently vomited around the time that this happened.  Had not been sick otherwise with diarrhea or vomiting before this.  Grandson reports last bowel movement as recent as Wednesday, though he could have had another 1 since then.  Unsure if he is passing gas.  No fevers noted by family.  He has never had episodes like this where he passed out.  Grandson also reports a chronic cough, though 2 days ago that this grew more productive.  Additionally, the patient has been a little more short of breath when transferring.  Meds:  Amlodipine 5 mg daily ASA 81 mg daily Lasix 40 mg  as needed (got it 07/28/2022)  ALLERGIES NKDA  Past Medical History:  Diagnosis Date   Anemia    BPH (benign prostatic hypertrophy)    TURP 05/19/13   Cerebral embolism with cerebral infarction (HCC) 12/19/2013   Cerebrovascular accident (CVA) (HCC) 04/17/2019   Chronic kidney disease    CHRONIC KIDNEY DISEASE, 2   COVID-19 03/19/2020   1/7- 03/26/2020 presentation of generalized weakness.  Treated with Decadron. CRP peaked at 2.3 and was 0.6 at discharge.  No D-dimer performed.   Difficulty hearing, right    BILATERAL HEARING LOSS - BEST TO TRY TO SPEAK INTO LEFT EAR   DVT (deep venous thrombosis) (HCC)    Frequent falls    History of DVT (deep vein thrombosis) 06/10/2013   Provoked s/p TURP. Dx per doppler 06/10/13. Complicated by hematuria while on anticoagulation s/p TURP. Initially on lovenox and coumadin, then stopped, then IVC filter placed 06/16/13. Anticoagulation restarted after hematuria stopped. Lovenox was discontinued apparently on 07/29/13 with comment on high risk for falls.   Total ~1.5 months of anticoagulation but interrupted with bleeding complication.   Hyperlipidemia    Hypertension    Incontinence of urine    SOME INCONTINENCE   Left adrenal mass (HCC) 09/11/2013   Meningioma (HCC)    Stroke (HCC)    Cerebellar, 2013; WALKS WITH  WALKER, ABLE TO DRESS AND BATHE HIMSELF BUT FAMILY TRIES TO PROVIDE SUPERVISION BECAUSE OF HIS HX OF FALL AND WEAKNESS LEGS, ARMS    Thrombocytopenia (HCC)    Vitreous hemorrhage (HCC) 12/21/2013   OS 12/18/13 CT /MRI brain done for ataxia     Past Surgical History:  Procedure Laterality Date   CYSTOSCOPY N/A 06/13/2013   Procedure: CYSTOSCOPY FLEXIBLE BEDSIDE;  Surgeon: Crist Fat, MD;  Location: [facility] OR;  Service: Urology;  Laterality: N/A;   MYRINGOTOMY WITH TUBE PLACEMENT Bilateral    TRANSURETHRAL RESECTION OF PROSTATE N/A 05/19/2013   Procedure: TRANSURETHRAL RESECTION OF THE PROSTATE WITH GYRUS INSTRUMENTS;  Surgeon: Kathi Ludwig, MD;  Location: [facility] ORS;  Service: Urology;  Laterality: N/A;    Social:  Lives with 2 grandsons Retired No substance use.  Family History: Only child, not sure about parents' health history.  Allergies: Allergies as of 07/29/2022   (No Known Allergies)    Review of Systems: A complete ROS was negative except as per HPI.   OBJECTIVE:   Physical Exam: Blood pressure 120/73, pulse 85, temperature 98 F (36.7 C), resp. rate 19, weight 83.6 kg, SpO2 98 %.  Constitutional: well-appearing elderly male sitting in hospital bed, in no acute distress, decreased hearing bilaterally HENT: normocephalic atraumatic, mucous membranes moist Eyes: conjunctiva non-erythematous Neck: supple Cardiovascular: regular rate and rhythm, no m/r/g Pulmonary/Chest: normal work of breathing on room air, mild crackles at bilateral bases Abdominal: soft, non-tender, non-distended MSK: normal bulk and tone, trace edema bilaterally Neurological: Unable to assess orientation, dysarthric, EOMI, 5/5 strength in bilateral upper and lower extremities, unable to assess sensation Skin: warm and dry Psych: Normal mood and affect  Labs: CBC    Component Value Date/Time   WBC 7.6 07/29/2022 0105   RBC 4.41 07/29/2022 0105   HGB 14.3 07/29/2022 0116   HGB 11.4 (L) 02/10/2021 1630   HCT 42.0 07/29/2022 0116   HCT 35.6 (L) 02/10/2021 1630   PLT 124 (L) 07/29/2022 0105   PLT 190 02/10/2021 1630   MCV 96.8 07/29/2022 0105   MCV 87 02/10/2021 1630   MCH 30.4 07/29/2022 0105   MCHC 31.4 07/29/2022 0105   RDW 13.4 07/29/2022 0105   RDW 13.6 02/10/2021 1630   LYMPHSABS 0.3 (L) 07/29/2022 0105   LYMPHSABS 1.3 01/13/2021 1156   MONOABS 0.6 07/29/2022 0105   EOSABS 0.1 07/29/2022 0105   EOSABS 0.2 01/13/2021 1156   BASOSABS 0.0 07/29/2022 0105   BASOSABS 0.0 01/13/2021 1156     CMP     Component Value Date/Time   NA 143 07/29/2022 0116   NA 149 (H) 02/10/2021 1630   K 4.7 07/29/2022 0116   CL  106 07/29/2022 0116   CO2 28 07/29/2022 0105   GLUCOSE 163 (H) 07/29/2022 0116   BUN 44 (H) 07/29/2022 0116   BUN 23 02/10/2021 1630   CREATININE 2.00 (H) 07/29/2022 0116   CREATININE 0.96 01/15/2014 1648   CALCIUM 10.3 07/29/2022 0105   PROT 7.1 07/29/2022 0105   ALBUMIN 3.5 07/29/2022 0105   AST 27 07/29/2022 0105   ALT 18 07/29/2022 0105   ALKPHOS 59 07/29/2022 0105   BILITOT 0.5 07/29/2022 0105   GFRNONAA 31 (L) 07/29/2022 0105   GFRNONAA 71 01/15/2014 1648   GFRAA 57 (L) 09/16/2019 1520   GFRAA 82 01/15/2014 1648    Imaging: CT Head Wo Contrast  Result Date: 07/29/2022 CLINICAL DATA:  Initial evaluation for mental status change, unknown cause. EXAM: CT HEAD  WITHOUT CONTRAST TECHNIQUE: Contiguous axial images were obtained from the base of the skull through the vertex without intravenous contrast. RADIATION DOSE REDUCTION: This exam was performed according to the departmental dose-optimization program which includes automated exposure control, adjustment of the mA and/or kV according to patient size and/or use of iterative reconstruction technique. COMPARISON:  Prior study from 04/26/2021. FINDINGS: Brain: Moderately advanced cerebral atrophy with advanced chronic microvascular ischemic disease. Chronic ischemic changes noted involving the right frontal and parietal lobes. Remote right greater than left cerebellar infarcts. There is new and/or increased parenchymal hypodensity involving the left cerebellum (series 3, image 9), age indeterminate, but could reflect acute to subacute ischemic changes. Superimposed subtle hyperdensity at this location could reflect petechial blood products. No other visible large vessel territory infarct. No acute intracranial hemorrhage. 2.3 cm meningioma at the left supraorbital region noted. Probable mild localized edema, similar. No other mass lesion or midline shift. Diffuse ventricular prominence related to global parenchymal volume loss of  hydrocephalus. No extra-axial fluid collection. Vascular: No convincing abnormal hyperdense vessel. Scattered calcified atherosclerosis present at the skull base. Skull: Scalp soft tissues within normal limits.  Calvarium intact. Sinuses/Orbits: Globes and orbital soft tissues within normal limits. Paranasal sinuses are clear. No mastoid effusion. Other: None. IMPRESSION: 1. New parenchymal hypodensity involving the left cerebellum, age indeterminate, but could reflect evolving acute to subacute ischemic changes. Possible superimposed petechial blood products. Correlation with dedicated MRI could be performed for further evaluation as warranted. 2. No other acute intracranial abnormality. 3. Atrophy with advanced chronic microvascular ischemic disease and multiple remote infarcts as above. 4. 2.3 cm left anterior cranial fossa meningioma, relatively stable. Electronically Signed   By: Rise Mu M.D.   On: 07/29/2022 02:34   CT ABDOMEN PELVIS WO CONTRAST  Result Date: 07/29/2022 CLINICAL DATA:  Abdominal pain, acute, nonlocalized EXAM: CT ABDOMEN AND PELVIS WITHOUT CONTRAST TECHNIQUE: Multidetector CT imaging of the abdomen and pelvis was performed following the standard protocol without IV contrast. RADIATION DOSE REDUCTION: This exam was performed according to the departmental dose-optimization program which includes automated exposure control, adjustment of the mA and/or kV according to patient size and/or use of iterative reconstruction technique. COMPARISON:  07/18/2020 FINDINGS: Lower chest: No acute abnormality. Hepatobiliary: Cholelithiasis without pericholecystic inflammatory change. Multiple simple cysts are seen scattered throughout the liver. These appear unchanged from prior examination. No focal intrahepatic mass identified on this noncontrast examination. No intra or extrahepatic biliary ductal dilation. Pancreas: Unremarkable Spleen: Unremarkable Adrenals/Urinary Tract: Stable 2.3 cm  left adrenal adenoma, definitively characterized on MRI examination of 09/15/2013. No follow-up imaging is recommended for this lesion. The right adrenal gland is unremarkable. The kidneys are normal in position. Moderate bilateral renal cortical atrophy. Multiple simple cortical cysts are seen within the kidneys bilaterally for which no follow-up imaging is recommended. No intrarenal or ureteral calculi. No hydronephrosis. Bladder unremarkable. Stomach/Bowel: Moderate to severe pancolonic diverticulosis, most severe within the sigmoid colon. Redundant sigmoid colon noted. The stomach, small bowel, and large bowel are otherwise unremarkable. No evidence of obstruction or focal inflammation. Appendix normal. No free intraperitoneal gas or fluid. 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 No abdominal aortic aneurysm. Inferior vena cava filter is seen in place within the infrarenal cava. No pathologic adenopathy within the abdomen and pelvis. Reproductive: Prostate is unremarkable. Other: Tiny fat containing umbilical hernia Musculoskeletal: Degenerative changes are seen within the visualized thoracolumbar spine. No acute bone abnormality. No lytic or  blastic bone lesion. IMPRESSION: 1. No acute intra-abdominal pathology identified. 2. Cholelithiasis. 3. Stable 2.3 cm left adrenal adenoma. No follow-up imaging is recommended. 4. Moderate to severe pancolonic diverticulosis without superimposed acute inflammatory change. 5. 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 Aortic Atherosclerosis (ICD10-I70.0). Electronically Signed   By: Helyn Numbers M.D.   On: 07/29/2022 02:22   DG Chest Port 1 View  Result Date: 07/29/2022 CLINICAL DATA:  Syncope EXAM: PORTABLE CHEST 1 VIEW COMPARISON:  Chest radiographs 10/28/2021 FINDINGS: Low lung volumes accentuate cardiomediastinal  silhouette and pulmonary vascularity. There is rightward deviation of the trachea with ill-defined thickening of the right paratracheal stripe. This appears similar to prior and is likely due to a combination of aortic aneurysm and dilated SVC given appearance on CT chest 04/26/2021. No focal consolidation, pleural effusion, or pneumothorax. No displaced rib fractures. IMPRESSION: No acute cardiopulmonary abnormalities. Calcified tortuous aorta. Electronically Signed   By: Minerva Fester M.D.   On: 07/29/2022 01:19    ASSESSMENT & PLAN:   Assessment & Plan by Problem: Active Problems:   Syncope   Patient is a 89 year old male with a past medical history that includes stroke, hypertension, hyperlipidemia, CKD III, HFpEF, vascular dementia, meningioma, and BPH who presented with vomiting and possible syncope admitted for syncope workup.  # Syncope # History of CVA Unclear if true syncope. Would be first episode for him. No clear loss of pulse but possible apnea for a few minutes. Differential is broad and includes cardiac arrhythmia, structural heart disease, CVA. Prior echo, EF 60-65% without aortic stenosis. Euvolemic on exam, BNP wnl. Prolonged PR interval on ECG this admission, troponins flat. Has history of CVA and head CT showed new parenchymal hypodensity involving the left cerebellum that could reflect evolving acute to subacute ischemic change. Could also be sleep apnea given seems to desat when sleeping this admission.  -f/u MRI -Echo pending -Telemetry -orthostatic vital signs -PT/OT/speech  # Productive cough # Emesis Patient has not had any other GI symptoms or fevers.  Concerns for aspiration pneumonitis given productive cough, possible apneic event, and dementia.  CTAP without obstructive bowel pattern though with diverticulosis.  # AKI on CKD II Baseline sCr around 1.2. Increased to 1.93 this admission. Unsure of etiology of AKI but possibly due to decreased oral intake in the  setting of dementia and emesis. BUN/Cr ratio suggest prerenal etiology. -LR 500 cc bolus with 75 mL/h maintenance -Hold Lasix  #Hypertension Normotensive this admission.   -Will hold home amlodipine.  Diet: NPO VTE: Enoxaparin IVF: LR,Bolus Code: DNR  Prior to Admission Living Arrangement: Home, living with two grandsons Anticipated Discharge Location:  pending Barriers to Discharge: medical management  Dispo: Admit patient to Observation with expected length of stay less than 2 midnights.  Signed: Adron Bene, MD Internal Medicine Resident PGY-1  07/29/2022, 7:40 AM

## 2022-07-29 NOTE — Plan of Care (Signed)
  Problem: Safety: Goal: Ability to remain free from injury will improve Outcome: Progressing   Problem: Skin Integrity: Goal: Risk for impaired skin integrity will decrease Outcome: Progressing   

## 2022-07-29 NOTE — Progress Notes (Signed)
NAME:  [name], MRN:  [id number], DOB:  Dec 14, 1924, LOS: 0 ADMISSION DATE:  07/29/2022  Subjective  Patient evaluated at bedside this AM. Reports he feels well overall, no acute complaints. Per family at bedside, patient was unresponsive for a few seconds, drooling, and was brought to the ground at the home. Once EMS arrived patient was back to baseline and was interactive. Family denies seeing any shaking of body/extremities, no focal symptoms. They do note a mild cough over recent days but no other changes.   Objective   Blood pressure (!) 147/81, pulse 64, temperature 98.4 F (36.9 C), temperature source Oral, resp. rate 19, height 5\' 8"  (1.727 m), weight 85.1 kg, SpO2 98 %.     Intake/Output Summary (Last 24 hours) at 07/29/2022 1401 Last data filed at 07/29/2022 1143 Gross per 24 hour  Intake 1003.75 ml  Output --  Net 1003.75 ml   Filed Weights   07/29/22 0101 07/29/22 1200  Weight: 83.6 kg 85.1 kg   Physical Exam: General: Elderly appearing person in no acute distress CV: Regular rate, rhythm. Warm extremities. Pulm: Normal work of breathing on room air. Clear to auscultation bilaterally Abdomen: Soft, non-tender, non-distended. Normoactive bowel sounds. MSK: Normal bulk, tone. No peripheral edema. Neuro: Awake, alert, dysarthric. Cranial nerves in tact. Strength 5/5 throughout, sensation in tact throughout   Labs       Latest Ref Rng & Units 07/29/2022    1:16 AM 07/29/2022    1:05 AM 10/28/2021    2:20 PM  CBC  WBC 4.0 - 10.5 K/uL  7.6  13.1   Hemoglobin 13.0 - 17.0 g/dL 65.7  84.6  96.2   Hematocrit 39.0 - 52.0 % 42.0  42.7  41.5   Platelets 150 - 400 K/uL  124  121       Latest Ref Rng & Units 07/29/2022    1:16 AM 07/29/2022    1:05 AM 10/28/2021    2:20 PM  BMP  Glucose 70 - 99 mg/dL 952  841  324   BUN 8 - 23 mg/dL 44  34  29   Creatinine 0.61 - 1.24 mg/dL 4.01  0.27  2.53   Sodium 135 - 145 mmol/L 143  141  141   Potassium 3.5 - 5.1 mmol/L 4.7  4.5   4.2   Chloride 98 - 111 mmol/L 106  104  111   CO2 22 - 32 mmol/L  28  26   Calcium 8.9 - 10.3 mg/dL  66.4  9.7     Summary   [name] is 89yo person living with vascular dementia, multiple cerebellar/occiptal/frontal lobe strokes, previous focal seizures thought to be related to prior CVA, hypertension, CKDIIIa, hyperlipidemia admitted 5/18 for episode of unresponsiveness.   Assessment & Plan:  Principal Problem:   Episode of unresponsiveness Active Problems:   Syncope   AKI (acute kidney injury) (HCC)   Pressure injury of skin  #Episode of unresponsiveness #Acute infarct of posterior L temporal gyrus #History of focal seizure in setting of recent CVA On admission difficult to discern whether episode was syncopal episode vs other underlying neurologic condition. On our exam this morning patient seemingly at his baseline per family, including chronic dysarthria. He does not appear infectious, no role for antibiotics at this time. Questionable aspiration episode at the house prior to arrival. We have engaged our neurology colleagues given new finding of small infarct on MRI on arrival. Unclear if this would have manifested as an  episode of unresponsiveness. We will obtain EEG, Echo, CTA head/neck. Of note, he was previously on anti-epileptic, unsure if he would benefit from this moving forward. Appreciate neurology's recommendations and assistance. - Follow-up neurology recommendations  - Follow-up EEG, Echo, CTA head/neck - Will defer antiplatelet therapy to neurology recommendations  - Hold home antihypertensive - PT/OT/SLP  #Acute kidney injury on CKDIIIa Initial sCr 2 on admission, baseline ~1.2. Patient has been on furosemide recently for as needed swelling. He did receive this yesterday. Suspect risk with his age and fragility outweighs the benefit. Would hold further furosemide at this time. He did receive total of 1000cc intravenous fluids earlier today. We will follow-up  with bladder scan today and repeat labs this afternoon.  - Follow-up bladder scan - Repeat BMP this PM - Strict I/O - Stop home furosemide at discharge  #History of vascular dementia At baseline per family. - Delirium precautions placed  Best practice:  DIET: NPO IVF: na DVT PPX: lovenox BOWEL: na CODE: DNR FAM COM: discussed with family at bedside   Evlyn Kanner, MD Internal Medicine Resident PGY-3 PAGER: [phone] 07/29/2022 2:01 PM  If after hours (below), please contact on-call pager: [phone] 5PM-7AM Monday-Friday 1PM-7AM Saturday-Sunday

## 2022-07-29 NOTE — ED Notes (Signed)
Pt back from MRI at this time

## 2022-07-30 ENCOUNTER — Observation Stay (HOSPITAL_BASED_OUTPATIENT_CLINIC_OR_DEPARTMENT_OTHER): Payer: Medicare HMO

## 2022-07-30 DIAGNOSIS — R55 Syncope and collapse: Secondary | ICD-10-CM | POA: Diagnosis not present

## 2022-07-30 DIAGNOSIS — R404 Transient alteration of awareness: Secondary | ICD-10-CM | POA: Diagnosis not present

## 2022-07-30 LAB — ECHOCARDIOGRAM COMPLETE
AR max vel: 3.81 cm2
AV Area VTI: 3.49 cm2
AV Area mean vel: 3.69 cm2
AV Mean grad: 4 mmHg
AV Peak grad: 8.4 mmHg
Ao pk vel: 1.45 m/s
Area-P 1/2: 3.31 cm2
Calc EF: 47.6 %
Height: 68 in
S' Lateral: 3 cm
Single Plane A2C EF: 60 %
Single Plane A4C EF: 40.8 %
Weight: 3001.78 oz

## 2022-07-30 LAB — LIPID PANEL
Cholesterol: 161 mg/dL (ref 0–200)
HDL: 51 mg/dL (ref >40)
LDL Cholesterol: 99 mg/dL (ref 0–99)
Total CHOL/HDL Ratio: 3.2 RATIO
Triglycerides: 56 mg/dL (ref <150)
VLDL: 11 mg/dL (ref 0–40)

## 2022-07-30 LAB — HEMOGLOBIN A1C
Hgb A1c MFr Bld: 6 % — ABNORMAL HIGH (ref 4.8–5.6)
Mean Plasma Glucose: 125.5 mg/dL

## 2022-07-30 MED ORDER — SENNOSIDES-DOCUSATE SODIUM 8.6-50 MG PO TABS
1.0000 | ORAL_TABLET | Freq: Two times a day (BID) | ORAL | Status: DC
  Administered 2022-07-30 – 2022-08-01 (×5): 1 via ORAL
  Filled 2022-07-30 (×5): qty 1

## 2022-07-30 MED ORDER — ATORVASTATIN CALCIUM 10 MG PO TABS
10.0000 mg | ORAL_TABLET | Freq: Every day | ORAL | Status: DC
  Administered 2022-07-31 – 2022-08-01 (×2): 10 mg via ORAL
  Filled 2022-07-30 (×2): qty 1

## 2022-07-30 MED ORDER — LACTATED RINGERS IV SOLN: INTRAVENOUS | Status: AC

## 2022-07-30 MED ORDER — ASPIRIN 81 MG PO TBEC
81.0000 mg | DELAYED_RELEASE_TABLET | Freq: Every day | ORAL | Status: DC
  Administered 2022-07-30 – 2022-08-01 (×3): 81 mg via ORAL
  Filled 2022-07-30 (×3): qty 1

## 2022-07-30 NOTE — Evaluation (Signed)
Physical Therapy Evaluation Patient Details Name: [name] MRN: [id number] DOB: 04/06/1924 Today's Date: 07/30/2022  History of Present Illness  [name] is a 89 y.o. male with history of cerebral infarct and dementia, known chronic left meningioma, admitted for episode of unresponsiveness followed by emesis.  Clinical Impression  Patient presents with dependencies in gait and mobility.  Patient requires +2 max assist for most mobility and has strong posterior lean in standing.  Feel patient would benefit from post-acute  inpatient follow up therapy <3 hrs/day.  Pt will benefit from continued PT to progress mobility, transfers and gait back to baseline.      Recommendations for follow up therapy are one component of a multi-disciplinary discharge planning process, led by the attending physician.  Recommendations may be updated based on patient status, additional functional criteria and insurance authorization.  Follow Up Recommendations Can patient physically be transported by private vehicle: No     Assistance Recommended at Discharge Frequent or constant Supervision/Assistance  Patient can return home with the following  Two people to help with walking and/or transfers;A lot of help with bathing/dressing/bathroom;Assistance with cooking/housework;Assist for transportation;Help with stairs or ramp for entrance    Equipment Recommendations None recommended by PT  Recommendations for Other Services       Functional Status Assessment Patient has had a recent decline in their functional status and demonstrates the ability to make significant improvements in function in a reasonable and predictable amount of time.     Precautions / Restrictions Precautions Precautions: Fall Restrictions Weight Bearing Restrictions: No      Mobility  Bed Mobility Overal bed mobility: Needs Assistance Bed Mobility: Supine to Sit     Supine to sit: Max assist, HOB elevated      General bed mobility comments: assistance sitting up and scooting to EOB    Transfers Overall transfer level: Needs assistance Equipment used: Rolling walker (2 wheels) Transfers: Sit to/from Stand Sit to Stand: Max assist, +2 safety/equipment, From elevated surface           General transfer comment: Pt displays posterior lean while standing, +2 for safety    Ambulation/Gait Ambulation/Gait assistance: Max assist, +2 physical assistance Gait Distance (Feet): 5 Feet Assistive device: Rolling walker (2 wheels) Gait Pattern/deviations: Step-to pattern, Shuffle, Wide base of support, Leaning posteriorly Gait velocity: decreased     General Gait Details: pt with definite posterior lean, unable to stand upright without assist  Stairs            Wheelchair Mobility    Modified Rankin (Stroke Patients Only)       Balance Overall balance assessment: Needs assistance Sitting-balance support: No upper extremity supported, Feet supported Sitting balance-Leahy Scale: Fair     Standing balance support: During functional activity, Reliant on assistive device for balance Standing balance-Leahy Scale: Zero Standing balance comment: posterior lean in standing, requirign +2 assist to maintain upright                             Pertinent Vitals/Pain Pain Assessment Pain Assessment: Faces Faces Pain Scale: No hurt    Home Living Family/patient expects to be discharged to:: Private residence Living Arrangements: Children Available Help at Discharge: Family Type of Home: House Home Access: Ramped entrance       Home Layout: One level Home Equipment: Agricultural consultant (2 wheels);Wheelchair - manual Additional Comments: Pt lives at home with grandchildren who are caregivers to Pt.  Prior Function Prior Level of Function : Needs assist       Physical Assist : Mobility (physical);ADLs (physical) Mobility (physical): Bed mobility;Transfers;Gait ADLs  (physical): Grooming;Bathing;Dressing;Toileting;IADLs Mobility Comments: RW inside, w/c outside ADLs Comments: significant assistance for all ADLs, set up to mod A for feeding, SPV for grooming     Hand Dominance        Extremity/Trunk Assessment   Upper Extremity Assessment Upper Extremity Assessment: RUE deficits/detail;LUE deficits/detail;Generalized weakness RUE Deficits / Details: B shoulder stiffness, decreased hand strength, decreased FM RUE Sensation: WNL RUE Coordination: decreased fine motor LUE Deficits / Details: B shoulder stiffness, decreased hand strength, decreased FM LUE Sensation: WNL LUE Coordination: decreased fine motor    Lower Extremity Assessment Lower Extremity Assessment: Generalized weakness    Cervical / Trunk Assessment Cervical / Trunk Assessment: Kyphotic  Communication   Communication: HOH  Cognition Arousal/Alertness: Awake/alert Behavior During Therapy: WFL for tasks assessed/performed Overall Cognitive Status: No family/caregiver present to determine baseline cognitive functioning (difficult to assess due to severe [facility])                                          General Comments      Exercises     Assessment/Plan    PT Assessment Patient needs continued PT services  PT Problem List Decreased strength;Decreased range of motion;Decreased activity tolerance;Decreased mobility;Decreased balance       PT Treatment Interventions Gait training;Therapeutic exercise;Therapeutic activities;Balance training;DME instruction;Functional mobility training;Patient/family education    PT Goals (Current goals can be found in the Care Plan section)  Acute Rehab PT Goals Patient Stated Goal: none stated PT Goal Formulation: With patient Time For Goal Achievement: 08/13/22 Potential to Achieve Goals: Fair    Frequency Min 2X/week     Co-evaluation               AM-PAC PT "6 Clicks" Mobility  Outcome Measure Help  needed turning from your back to your side while in a flat bed without using bedrails?: A Lot Help needed moving from lying on your back to sitting on the side of a flat bed without using bedrails?: A Lot Help needed moving to and from a bed to a chair (including a wheelchair)?: A Lot Help needed standing up from a chair using your arms (e.g., wheelchair or bedside chair)?: A Lot Help needed to walk in hospital room?: A Lot Help needed climbing 3-5 steps with a railing? : Total 6 Click Score: 11    End of Session   Activity Tolerance: Patient tolerated treatment well Patient left: in chair;with call bell/phone within reach;with chair alarm set   PT Visit Diagnosis: Unsteadiness on feet (R26.81);Other abnormalities of gait and mobility (R26.89);Muscle weakness (generalized) (M62.81);Difficulty in walking, not elsewhere classified (R26.2)    Time: 1610-9604 PT Time Calculation (min) (ACUTE ONLY): 21 min   Charges:   PT Evaluation $PT Eval Moderate Complexity: 1 Mod          07/30/2022 Margie, PT Acute Rehabilitation Services Office:  [phone]   Olivia Canter 07/30/2022, 3:23 PM

## 2022-07-30 NOTE — Progress Notes (Addendum)
                 Interval history No concerns this morning. Denies pain, discomfort. Reports dry mouth. No dysuria. No BM. Aware that an ambulance brought him to the hospital.  Physical exam Blood pressure (!) 140/86, pulse 70, temperature 98 F (36.7 C), temperature source Oral, resp. rate 17, height 5\' 8"  (1.727 m), weight 85.1 kg, SpO2 99 %.  No apparent discomfort Oropharyngeal mucosa dry Heart rate is normal, rhythm is regular, radial pulses strong Breathing is regular and unlabored on 3L [place] Abdomen is soft and non-tender Skin is warm and dry Alert, oriented to event  Weight change: 1.5 kg   Intake/Output Summary (Last 24 hours) at 07/30/2022 0736 Last data filed at 07/30/2022 0016 Gross per 24 hour  Intake 903.75 ml  Output 1100 ml  Net -196.25 ml   Net IO Since Admission: -96.25 mL [07/30/22 0736]  Labs, images, and other studies Creatinine 1.65, decreasing  Assessment and plan Hospital day 0  [name] is a 89 y.o. male with history of cerebral infarct and dementia, known chronic left meningioma, admitted for episode of unresponsiveness followed by emesis.  Principal Problem:   Episode of unresponsiveness Active Problems:   Syncope   AKI (acute kidney injury) (HCC)   Pressure injury of skin  Episode of unresponsiveness Workup for cerebral infarct/ischemia ongoing.  EEG with some cortical dysfunction arising from the left, same side as known meningioma.  Differential includes syncope, notable findings thus far include first-degree heart block and a bit of sinus arrhythmia on telemetry.  Current condition is stable.  He is alert and oriented to event.  Convalescing well.  Appreciate neurology assistance with this case. - Pending echocardiogram  History of cerebral infarct Restart aspirin. - Aspirin 81 mg daily  First-degree heart block Telemetry with some atrial arrhythmia but nothing that looks like high grade block. Don't see any prolonged sinus  pauses or non-conducting P waves. Appears consistent with wandering atrial pacemaker. Wonder about ambulatory cardiac monitoring on discharge if neurologic etiology is thought to be unlikely.  AKI on CKD 3A Improving post fluid resuscitation. Still dry on exam, will start maintenance IV fluids while n.p.o. - LR IV 125 mL/h  Transitions of care Anticipate need for close supervision on discharge.  Spoke with grandson [name] who reports this person would likely prefer a comfort and quality oriented approach to medical care without invasive or uncomfortable diagnostic testing or treatment.  He will speak with the rest the family, but he suspects they will be amenable to SNF placement for subacute rehab if this person is deemed appropriate for that kind of therapy. - PT/OT/SLP  Diet: N.p.o. except for sips with meds IVF: LR 125 mL/h VTE: enoxaparin (LOVENOX) injection 30 mg Start: 07/29/22 0630  Code: DNR Family Update: [name], by phone   Marrianne Mood MD 07/30/2022, 7:36 AM  Pager: [phone] After 5pm or weekend: [phone]

## 2022-07-30 NOTE — Progress Notes (Signed)
   2D echo attempted, patient in chair. Will try later  [place] 07/30/2022, 2:24 PM

## 2022-07-30 NOTE — Evaluation (Signed)
Occupational Therapy Evaluation Patient Details Name: [name] MRN: [id number] DOB: Aug 25, 1924 Today's Date: 07/30/2022   History of Present Illness ROLLAN ELSENPETER is a 89 y.o. male with history of cerebral infarct and dementia, known chronic left meningioma, admitted for episode of unresponsiveness followed by emesis.   Clinical Impression   Pt is s/p above mentioned diagnosis. Pt lives at home with grandchildren who rotate shifts to help assist with Pt. Pt at baseline requires maximal assistance for ADLs, min A for mobility/transfers, and 24/7 care for safety. Pt's granddaughter and grandson present during session, states that they may not be able to safely take care of Pt and are seeking additional long-term assistance. Pt currently is max A x2 for sit to stand and transfers, total A for LB dressing, and significant assistance in all areas, would benefit greatly from post acute long-term placement due to extensive needs and caregiver burden at home. Pt will be seen acutely during stay.      Recommendations for follow up therapy are one component of a multi-disciplinary discharge planning process, led by the attending physician.  Recommendations may be updated based on patient status, additional functional criteria and insurance authorization.   Assistance Recommended at Discharge Frequent or constant Supervision/Assistance  Patient can return home with the following A lot of help with walking and/or transfers;A lot of help with bathing/dressing/bathroom;Assistance with cooking/housework;Assistance with feeding;Direct supervision/assist for medications management;Assist for transportation;Help with stairs or ramp for entrance    Functional Status Assessment  Patient has had a recent decline in their functional status and demonstrates the ability to make significant improvements in function in a reasonable and predictable amount of time.  Equipment Recommendations  Other (comment)  (defer to next venue, if going home, tub bench)    Recommendations for Other Services       Precautions / Restrictions Precautions Precautions: Fall Restrictions Weight Bearing Restrictions: No      Mobility Bed Mobility Overal bed mobility: Needs Assistance Bed Mobility: Supine to Sit     Supine to sit: Max assist, HOB elevated     General bed mobility comments: assistance sitting up and scooting to EOB    Transfers Overall transfer level: Needs assistance Equipment used: Rolling walker (2 wheels), 2 person hand held assist Transfers: Sit to/from Stand, Bed to chair/wheelchair/BSC Sit to Stand: Max assist, +2 safety/equipment, From elevated surface     Step pivot transfers: Max assist, +2 safety/equipment     General transfer comment: Pt displays posterior lean while standing, +2 for safety      Balance Overall balance assessment: Needs assistance Sitting-balance support: Single extremity supported, Feet supported Sitting balance-Leahy Scale: Poor     Standing balance support: During functional activity, Reliant on assistive device for balance Standing balance-Leahy Scale: Poor Standing balance comment: posterior lean with RW, x2 assistance for balance while standing                           ADL either performed or assessed with clinical judgement   ADL Overall ADL's : Needs assistance/impaired Eating/Feeding: Moderate assistance   Grooming: Moderate assistance;Sitting   Upper Body Bathing: Maximal assistance;Sitting   Lower Body Bathing: Total assistance;Sitting/lateral leans   Upper Body Dressing : Moderate assistance;Sitting   Lower Body Dressing: Total assistance;Sit to/from stand   Toilet Transfer: Maximal assistance;+2 for safety/equipment;Rolling walker (2 wheels)   Toileting- Clothing Manipulation and Hygiene: Total assistance;Sitting/lateral lean       Functional mobility  during ADLs: Maximal assistance;+2 for physical  assistance;Rolling walker (2 wheels) General ADL Comments: Pt requires significant assistance for LB ADLs, difficulty standing up during transfers with posterior lean     Vision         Perception     Praxis      Pertinent Vitals/Pain Pain Assessment Pain Assessment: No/denies pain     Hand Dominance     Extremity/Trunk Assessment Upper Extremity Assessment Upper Extremity Assessment: RUE deficits/detail;LUE deficits/detail;Generalized weakness RUE Deficits / Details: B shoulder stiffness, decreased hand strength, decreased FM RUE Sensation: WNL RUE Coordination: decreased fine motor LUE Deficits / Details: B shoulder stiffness, decreased hand strength, decreased FM LUE Sensation: WNL LUE Coordination: decreased fine motor   Lower Extremity Assessment Lower Extremity Assessment: Defer to PT evaluation       Communication Communication Communication: HOH   Cognition Arousal/Alertness: Awake/alert Behavior During Therapy: WFL for tasks assessed/performed Overall Cognitive Status: Within Functional Limits for tasks assessed                                 General Comments: Pt family was present, states no cognitive deficits other than HOH, Pt displays decreased safety awareness during transfer, overall able to follow commands as able to     General Comments       Exercises     Shoulder Instructions      Home Living Family/patient expects to be discharged to:: Private residence Living Arrangements: Children Available Help at Discharge: Family Type of Home: House Home Access: Ramped entrance     Home Layout: One level     Bathroom Shower/Tub: Chief Strategy Officer: Standard     Home Equipment: Agricultural consultant (2 wheels);Wheelchair - manual   Additional Comments: Pt lives at home with grandchildren who are caregivers to Pt.      Prior Functioning/Environment Prior Level of Function : Needs assist       Physical Assist :  Mobility (physical);ADLs (physical) Mobility (physical): Bed mobility;Transfers;Gait ADLs (physical): Grooming;Bathing;Dressing;Toileting;IADLs Mobility Comments: RW inside, w/c outside ADLs Comments: significant assistance for all ADLs, set up to mod A for feeding, SPV for grooming        OT Problem List: Decreased strength;Decreased range of motion;Decreased activity tolerance;Impaired balance (sitting and/or standing);Decreased coordination;Impaired UE functional use      OT Treatment/Interventions: Self-care/ADL training;Therapeutic exercise;Energy conservation;DME and/or AE instruction;Therapeutic activities;Patient/family education    OT Goals(Current goals can be found in the care plan section) Acute Rehab OT Goals Patient Stated Goal: to return home at [facility] OT Goal Formulation: With patient/family Time For Goal Achievement: 08/13/22 Potential to Achieve Goals: Good  OT Frequency: Min 2X/week    Co-evaluation              AM-PAC OT "6 Clicks" Daily Activity     Outcome Measure Help from another person eating meals?: A Lot Help from another person taking care of personal grooming?: A Lot Help from another person toileting, which includes using toliet, bedpan, or urinal?: A Lot Help from another person bathing (including washing, rinsing, drying)?: A Lot Help from another person to put on and taking off regular upper body clothing?: A Lot Help from another person to put on and taking off regular lower body clothing?: Total 6 Click Score: 11   End of Session Equipment Utilized During Treatment: Gait belt;Rolling walker (2 wheels) Nurse Communication: Mobility status  Activity Tolerance: Patient tolerated treatment  well Patient left: in chair;with call bell/phone within reach;with chair alarm set  OT Visit Diagnosis: Unsteadiness on feet (R26.81);Other abnormalities of gait and mobility (R26.89);Muscle weakness (generalized) (M62.81)                Time: 4098-1191 OT  Time Calculation (min): 39 min Charges:  OT General Charges $OT Visit: 1 Visit OT Evaluation $OT Eval High Complexity: 1 High OT Treatments $Self Care/Home Management : 8-22 mins $Therapeutic Activity: 8-22 mins  Reade Trefz, OTR/L  Alexis Goodell 07/30/2022, 2:59 PM

## 2022-07-30 NOTE — Progress Notes (Signed)
  Echocardiogram 2D Echocardiogram has been performed.  [name] 07/30/2022, 3:01 PM

## 2022-07-30 NOTE — Progress Notes (Addendum)
STROKE TEAM PROGRESS NOTE   INTERVAL HISTORY No family is present at the bedside.  Neurologic exam stable on examination this morning.  Vital signs stable overnight.  MRI brain with possible punctate acute cortical infarct in the posterior left superior temporal gyrus, felt to be artifactual finding. Routine EEG 5/18 suggestive of cortical dysfunction from the left anterior temporal region likely secondary to underlying chronic meningioma.  No seizures or epileptiform discharges seen throughout the recording.  Vitals:   07/29/22 2025 07/30/22 0012 07/30/22 0443 07/30/22 0832  BP: (!) 145/83 (!) 145/84 (!) 140/86 (!) 131/93  Pulse: 73 71 70 75  Resp: 16 16 17 18   Temp: 98 F (36.7 C)   98.3 F (36.8 C)  TempSrc: Oral   Oral  SpO2: 100% 99% 99% 93%  Weight:      Height:       CBC:  Recent Labs  Lab 07/29/22 0105 07/29/22 0116  WBC 7.6  --   NEUTROABS 6.7  --   HGB 13.4 14.3  HCT 42.7 42.0  MCV 96.8  --   PLT 124*  --    Basic Metabolic Panel:  Recent Labs  Lab 07/29/22 0105 07/29/22 0116 07/29/22 1522  NA 141 143 142  K 4.5 4.7 4.6  CL 104 106 109  CO2 28  --  26  GLUCOSE 171* 163* 104*  BUN 34* 44* 30*  CREATININE 1.93* 2.00* 1.65*  CALCIUM 10.3  --  9.8   Lipid Panel: No results for input(s): "CHOL", "TRIG", "HDL", "CHOLHDL", "VLDL", "LDLCALC" in the last 168 hours. HgbA1c: No results for input(s): "HGBA1C" in the last 168 hours. Urine Drug Screen: No results for input(s): "LABOPIA", "COCAINSCRNUR", "LABBENZ", "AMPHETMU", "THCU", "LABBARB" in the last 168 hours.  Alcohol Level No results for input(s): "ETH" in the last 168 hours.  IMAGING past 24 hours CT ANGIO HEAD NECK W WO CM  Result Date: 07/29/2022 CLINICAL DATA:  Initial evaluation for neuro deficit, stroke. EXAM: CT ANGIOGRAPHY HEAD AND NECK WITH AND WITHOUT CONTRAST TECHNIQUE: Multidetector CT imaging of the head and neck was performed using the standard protocol during bolus administration of  intravenous contrast. Multiplanar CT image reconstructions and MIPs were obtained to evaluate the vascular anatomy. Carotid stenosis measurements (when applicable) are obtained utilizing NASCET criteria, using the distal internal carotid diameter as the denominator. RADIATION DOSE REDUCTION: This exam was performed according to the departmental dose-optimization program which includes automated exposure control, adjustment of the mA and/or kV according to patient size and/or use of iterative reconstruction technique. CONTRAST:  75mL OMNIPAQUE IOHEXOL 350 MG/ML SOLN COMPARISON:  Prior studies from earlier the same day. FINDINGS: CT HEAD FINDINGS Brain: Atrophy with chronic microvascular ischemic disease with multiple chronic ischemic infarcts again noted. Previously identified tiny ischemic infarct not visible by CT. No other acute large vessel territory infarct. No acute intracranial hemorrhage. Left anterior cranial fossa meningioma again noted, stable. No other mass effect or midline shift. Ventricular prominence related to global parenchymal volume loss of hydrocephalus. No extra-axial fluid collection. Vascular: No abnormal hyperdense vessel. Skull: Scalp soft tissues and calvarium demonstrate no new finding. Sinuses/Orbits: Chronic/remote posttraumatic defect at the left lamina papyracea. No acute finding. Paranasal sinuses are clear. No mastoid effusion. Other: None. Review of the MIP images confirms the above findings CTA NECK FINDINGS Aortic arch: Visualized ascending aorta dilated up to 4.4 cm. Mild atheromatous change about the arch itself. No stenosis about the origin of the great vessels. Right carotid system: Right common and  internal carotid arteries are tortuous without dissection. No hemodynamically significant stenosis about the right carotid artery system. Left carotid system: Left common and internal carotid arteries are tortuous without dissection. No hemodynamically significant stenosis about  the left carotid artery system. Vertebral arteries: Left vertebral artery arises directly from the aortic arch. Vertebral arteries are patent without stenosis or dissection. Skeleton: No discrete or worrisome osseous lesions. Reversal of the normal cervical lordosis with advanced cervical spondylosis. Other neck: No other acute finding within the neck. Upper chest: Visualized upper chest demonstrates no acute finding. Review of the MIP images confirms the above findings CTA HEAD FINDINGS Anterior circulation: Both internal carotid arteries are widely patent through the siphons without stenosis. A1 segments, anterior greater complex common anterior cerebral arteries patent without stenosis. No M1 stenosis or occlusion. No proximal MCA branch occlusion or high-grade stenosis bowed distal MCA branches perfused and symmetric. Posterior circulation: Both V4 segments widely patent. Left PICA patent. Right PICA not seen. Basilar widely patent without stenosis. Superior cerebellar and posterior cerebral arteries widely patent bilaterally. Venous sinuses: Patent allowing for timing the contrast bolus. Anatomic variants: None significant.  No aneurysm. Review of the MIP images confirms the above findings IMPRESSION: CT HEAD IMPRESSION: 1. No acute intracranial abnormality. Previously identified tiny ischemic infarct not visible by CT. 2. Underlying atrophy with advanced chronic microvascular ischemic disease, with multiple chronic ischemic infarcts, stable. 3. Stable left anterior cranial fossa meningioma. CTA HEAD AND NECK IMPRESSION: 1. Negative CTA for large vessel occlusion or other emergent finding. 2. Diffuse tortuosity about the major arterial vasculature of the head and neck, suggesting chronic underlying hypertension. 3. No hemodynamically significant or correctable stenosis about the major arterial vasculature of the head and neck. 4. Ectatic ascending aorta measuring up to 4.4 cm in diameter. Recommend annual  imaging followup by CTA or MRA. This recommendation follows 2010 ACCF/AHA/AATS/ACR/ASA/SCA/SCAI/SIR/STS/SVM Guidelines for the Diagnosis and Management of Patients with Thoracic Aortic Disease. Circulation. 2010; 121: Z610-R604. Aortic aneurysm NOS (ICD10-I71.9) Aortic Atherosclerosis (ICD10-I70.0). Electronically Signed   By: Rise Mu M.D.   On: 07/29/2022 22:37   EEG adult  Result Date: 07/29/2022 Charlsie Quest, MD     07/29/2022  4:06 PM Patient Name: [name] MRN: [id number] Epilepsy Attending: Charlsie Quest Referring Physician/Provider: Evlyn Kanner, MD Date: 07/29/2022 Duration: 27.08 mins Patient history: 89yo m with episode of unresponsive for a few seconds, drooling, and was brought to the ground at the home . EEG to evaluate for seizure Level of alertness: Awake AEDs during EEG study: None Technical aspects: This EEG study was done with scalp electrodes positioned according to the 10-20 International system of electrode placement. Electrical activity was reviewed with band pass filter of 1-70Hz , sensitivity of 7 uV/mm, display speed of 31mm/sec with a 60Hz  notched filter applied as appropriate. EEG data were recorded continuously and digitally stored.  Video monitoring was available and reviewed as appropriate. Description: The posterior dominant rhythm consists of 8 Hz activity of moderate voltage (25-35 uV) seen predominantly in posterior head regions, symmetric and reactive to eye opening and eye closing. EEG showed intermittent 2-3hz  delta slowing in left anterior  temporal region. Hyperventilation and photic stimulation were not performed.   ABNORMALITY - Intermittent slow, left anterior temporal region. IMPRESSION: This study is suggestive of cortical dysfunction arising from left anterior temporal region likely secondary to underlying meningioma. No seizures or epileptiform discharges were seen throughout the recording. Please note that lack of epileptiform activity  during interictal EEG does  not exclude the diagnosis of epilepsy. Priyanka Annabelle Harman    PHYSICAL EXAM Constitutional: Appears well-developed and well-nourished.  Psych: Affect appropriate to situation, calm and cooperative with exam Eyes: No scleral injection HENT: No OP obstrucion, hearing aid in place with patient extremely hard of hearing MSK: no joint deformities or swelling Cardiovascular: Normal rate and regular rhythm.  Respiratory: Effort normal, non-labored breathing GI: Soft.  No distension. There is no tenderness.  Skin: WDI  Neuro: Mental Status: Patient is asleep initially, wakes easily to loud voice.  He is able to state his name correctly and that he is at [facility].  He is not oriented to his age, the current month, or the year.  This is patient's reported baseline.  Patient is able to follow simple commands though some limitation due to patient's hearing deficit. Speech is dysarthric.  He is able to name objects without difficulty. Cranial Nerves: II: Visual Fields are full to confrontation. Pupils are equal, round, and reactive to light.  III,IV, VI: EOMI without ptosis or diploplia.  V: Facial sensation is intact and symmetric to light touch VII: Facial movement is symmetric resting and with movement VIII: Patient is extremely hard of hearing with hearing aids in place bilaterally X: Phonation is intact XI: Shoulder shrug is symmetric. XII: Tongue protrudes midline  Motor: Tone is slightly increased in bilateral lower extremities. Bulk is normal.  Bilateral upper extremities are able to elevate antigravity without vertical drift.  Bilateral lower extremities have some minimal antigravity elevation due to chronic weakness (patient is wheelchair-bound at baseline). Sensory: Sensation is symmetric to light touch and temperature in the arms and legs. No extinction to DSS present. Cerebellar: Does not perform  ASSESSMENT/PLAN [name] is a 89 y.o.  male with history of vascular dementia, multiple strokes with cerebellar, occipital, and frontal lobe involvement, with concern for post-stroke focal seizures in 2021, HTN, HLD, and CKD 3a, presenting with an acute episode of unresponsiveness followed by emesis.  Patient's grandson found patient slumped over in his wheelchair for approximately 2-3 minutes followed by an episode of emesis prior to return to baseline mental status.  MRI brain obtained on arrival with concern for possible punctate DWI signal abnormality in the posterior left superior temporal gyrus concerning for possible acute cortical infarct though on neurology review, the signal abnormality was felt to be artifact.  Possible TIA versus unwitnessed seizure with postictal state versus acute transient cognitive fluctuation in the setting of dementia Code Stroke CT head new parenchymal hypodensity involving the left cerebellum, age-indeterminate, but could reflect evolving acute to subacute ischemic changes.  Possible superimposed petechial blood products.  Correlation with dedicated MRI should be performed for further evaluation as warranted.  (On neurology review, these findings are felt to be consistent with artifact). No other acute intracranial abnormality.  Atrophy with advanced chronic microvascular ischemic disease and multiple remote infarcts.  2.3 cm left anterior cranial fossa meningioma, relatively stable. Repeat CT head: No acute intracranial abnormality.  Previously identified tiny ischemic infarct not visible by CT.  Underlying atrophy with advanced chronic microvascular ischemic disease, with multiple chronic ischemic infarcts, stable.  Stable left anterior cranial fossa meningioma. CTA head & neck negative CTA for LVO or other emergent finding.  Diffuse tortuosity about the major arterial vasculature of the head and neck, suggesting chronic underlying hypertension.  No hemodynamically significant or correctable stenosis about the  major arterial vasculature of the head neck. MRI possible punctate acute cortical infarct in the  posterior left superior temporal gyrus.  No associated hemorrhage or mass effect.  No other acute intracranial abnormality, but underlying very advanced chronic ischemic disease.  Chronic left anterior clinoid region meningioma.  Regional mass effect and possible associated chronic cerebral edema are stable since 2021. 2D Echo EF 65-70%. Aortic dilatation noted. Routine EEG 07/29/2022: "This study is suggestive of cortical dysfunction arising from left anterior temporal region likely secondary to underlying meningioma. No seizures or epileptiform discharges were seen throughout the recording. Please note that lack of epileptiform activity during interictal EEG does not exclude the diagnosis of epilepsy." LDL 99 HgbA1c 6.0 VTE prophylaxis - lovenox SQ    Diet   Diet regular Room service appropriate? Yes; Fluid consistency: Thin   aspirin 81 mg daily prior to admission, now on aspirin 81 mg daily.  Therapy recommendations:  pending Disposition:  pending  Hypertension Home meds:  Norvasc, lasix Stable Long-term BP goal normotensive  Hyperlipidemia Home meds:  none LDL 121(old from 11/23), goal < 70 Add atorvastatin 40 mg PO daily  Continue statin at discharge  Other Stroke Risk Factors Advanced Age >/= 32  History of stroke  Other Active Problems Vascular dementia CKD 3a Concern for post-stroke focal seizures in 2021 Previously on Keppra, discontinued in 2022 after neurology work up was negative for seizures  Hospital day # 0  Lanae Boast, AGACNP-BC [facility] Pager: [phone]  ATTENDING ATTESTATION:  MRI likely artifact. Exam unremarkable.  Echo neg.   LDL >70 start statin.  Neurology will sign off, call with questions.   Dr. Viviann Spare evaluated pt independently, reviewed imaging, chart, labs. Discussed and formulated plan with the Resident/APP. Changes  were made to the note where appropriate. Please see APP/resident note above for details.     Takumi Din,MD   To contact Stroke Continuity provider, please refer to [email or web address]. After hours, contact General Neurology

## 2022-07-31 DIAGNOSIS — I129 Hypertensive chronic kidney disease with stage 1 through stage 4 chronic kidney disease, or unspecified chronic kidney disease: Secondary | ICD-10-CM | POA: Diagnosis not present

## 2022-07-31 DIAGNOSIS — N1831 Chronic kidney disease, stage 3a: Secondary | ICD-10-CM | POA: Diagnosis not present

## 2022-07-31 DIAGNOSIS — F0390 Unspecified dementia without behavioral disturbance: Secondary | ICD-10-CM | POA: Diagnosis not present

## 2022-07-31 DIAGNOSIS — Z8673 Personal history of transient ischemic attack (TIA), and cerebral infarction without residual deficits: Secondary | ICD-10-CM | POA: Diagnosis not present

## 2022-07-31 DIAGNOSIS — N179 Acute kidney failure, unspecified: Secondary | ICD-10-CM | POA: Diagnosis not present

## 2022-07-31 LAB — BASIC METABOLIC PANEL
Anion gap: 5 (ref 5–15)
BUN: 30 mg/dL — ABNORMAL HIGH (ref 8–23)
CO2: 25 mmol/L (ref 22–32)
Calcium: 9.5 mg/dL (ref 8.9–10.3)
Chloride: 106 mmol/L (ref 98–111)
Creatinine, Ser: 1.58 mg/dL — ABNORMAL HIGH (ref 0.61–1.24)
GFR, Estimated: 40 mL/min — ABNORMAL LOW (ref >60)
Glucose, Bld: 104 mg/dL — ABNORMAL HIGH (ref 70–99)
Potassium: 4.2 mmol/L (ref 3.5–5.1)
Sodium: 136 mmol/L (ref 135–145)

## 2022-07-31 NOTE — [place] FL2 (Signed)
[place] MEDICAID FL2 LEVEL OF CARE FORM     IDENTIFICATION  Patient Name: [name] Birthdate: 02-28-1925 Sex: male Admission Date (Current Location): 07/29/2022  [facility] and [place] Number:  Producer, television/film/video and Address:  [facility], [address], [place], [place] [address]      Provider Number: [id number]  Attending Physician Name and Address:  Reymundo Poll, MD  Relative Name and Phone Number:  Franchot Mimes Granddaughter [phone]    Current Level of Care: Hospital Recommended Level of Care: Skilled Nursing Facility Prior Approval Number:    Date Approved/Denied:   PASRR Number: [id number]  Discharge Plan: SNF    Current Diagnoses: Patient Active Problem List   Diagnosis Date Noted   Syncope 07/29/2022   Episode of unresponsiveness 07/29/2022   AKI (acute kidney injury) (HCC) 07/29/2022   Pressure injury of skin 07/29/2022   Lung field abnormal finding on examination 05/31/2022   Irritable 05/12/2021   COVID-19 04/26/2021   Aortic aneurysm (HCC) 04/26/2021   Bilateral adrenal adenomas 04/26/2021   Cholelithiasis 04/26/2021   Nocturia 01/14/2021   Diverticulosis of colon 07/19/2020   Thrombocytopenia (HCC) 03/30/2020   Partial symptomatic epilepsy with complex partial seizures, not intractable, without status epilepticus (HCC) 04/17/2019   Chronic diastolic (congestive) heart failure (HCC) 10/28/2017   Cerebellar ataxia in diseases classified elsewhere (HCC) 02/13/2017   Vascular dementia (HCC) 01/28/2016   Presence of IVC filter 07/17/2014   CKD (chronic kidney disease) stage 3, GFR 30-59 ml/min (HCC) 06/23/2013   Hyperlipidemia    BPH (benign prostatic hyperplasia) 04/24/2013   Cerumen impaction 08/30/2012   Hearing loss 07/20/2011   Meningioma (HCC) 07/20/2011   HTN (hypertension) 07/19/2011    Orientation RESPIRATION BLADDER Height & Weight     Self, Place  O2 Incontinent Weight: 187 lb 9.8 oz  (85.1 kg) Height:  5\' 8"  (172.7 cm)  BEHAVIORAL SYMPTOMS/MOOD NEUROLOGICAL BOWEL NUTRITION STATUS      Continent Diet (see discharge summary)  AMBULATORY STATUS COMMUNICATION OF NEEDS Skin   Extensive Assist Verbally Other (Comment) (redness)                       Personal Care Assistance Level of Assistance  Bathing, Feeding, Dressing, Total care Bathing Assistance: Maximum assistance Feeding assistance: Limited assistance Dressing Assistance: Maximum assistance Total Care Assistance: Maximum assistance   Functional Limitations Info  Sight, Hearing, Speech Sight Info: Adequate Hearing Info: Impaired Speech Info: Adequate    SPECIAL CARE FACTORS FREQUENCY  PT (By licensed PT), OT (By licensed OT)     PT Frequency: 5x week OT Frequency: 5x week            Contractures Contractures Info: Not present    Additional Factors Info  Code Status, Allergies Code Status Info: DNR Allergies Info: NKA           Current Medications (07/31/2022):  This is the current hospital active medication list Current Facility-Administered Medications  Medication Dose Route Frequency Provider Last Rate Last Admin   acetaminophen (TYLENOL) tablet 650 mg  650 mg Oral Q6H PRN Champ Mungo, DO   650 mg at 07/31/22 0944   Or   acetaminophen (TYLENOL) suppository 650 mg  650 mg Rectal Q6H PRN Champ Mungo, DO       aspirin EC tablet 81 mg  81 mg Oral Daily Marrianne Mood, MD   81 mg at 07/31/22 0944   atorvastatin (LIPITOR) tablet 10 mg  10 mg  Oral Daily Harolyn Rutherford, MD   10 mg at 07/31/22 0944   enoxaparin (LOVENOX) injection 30 mg  30 mg Subcutaneous Q24H Champ Mungo, DO   30 mg at 07/31/22 0609   ondansetron (ZOFRAN) injection 4 mg  4 mg Intravenous Q6H PRN Champ Mungo, DO       senna-docusate (Senokot-S) tablet 1 tablet  1 tablet Oral BID Marrianne Mood, MD   1 tablet at 07/31/22 1610     Discharge Medications: Please see discharge summary for a list of discharge  medications.  Relevant Imaging Results:  Relevant Lab Results:   Additional Information SSN [id number].  Lorri Frederick, LCSW

## 2022-07-31 NOTE — Evaluation (Signed)
Clinical/Bedside Swallow Evaluation Patient Details  Name: [name] MRN: [id number] Date of Birth: 10/24/1924  Today's Date: 07/31/2022 Time: SLP Start Time (ACUTE ONLY): 1045 SLP Stop Time (ACUTE ONLY): 1053 SLP Time Calculation (min) (ACUTE ONLY): 8 min  Past Medical History:  Past Medical History:  Diagnosis Date   Anemia    BPH (benign prostatic hypertrophy)    TURP 05/19/13   Cerebral embolism with cerebral infarction (HCC) 12/19/2013   Cerebrovascular accident (CVA) (HCC) 04/17/2019   Chronic kidney disease    CHRONIC KIDNEY DISEASE, 2   COVID-19 03/19/2020   1/7- 03/26/2020 presentation of generalized weakness.  Treated with Decadron. CRP peaked at 2.3 and was 0.6 at discharge.  No D-dimer performed.   Difficulty hearing, right    BILATERAL HEARING LOSS - BEST TO TRY TO SPEAK INTO LEFT EAR   DVT (deep venous thrombosis) (HCC)    Frequent falls    History of DVT (deep vein thrombosis) 06/10/2013   Provoked s/p TURP. Dx per doppler 06/10/13. Complicated by hematuria while on anticoagulation s/p TURP. Initially on lovenox and coumadin, then stopped, then IVC filter placed 06/16/13. Anticoagulation restarted after hematuria stopped. Lovenox was discontinued apparently on 07/29/13 with comment on high risk for falls.   Total ~1.5 months of anticoagulation but interrupted with bleeding complication.   Hyperlipidemia    Hypertension    Incontinence of urine    SOME INCONTINENCE   Left adrenal mass (HCC) 09/11/2013   Meningioma (HCC)    Stroke (HCC)    Cerebellar, 2013; WALKS WITH WALKER, ABLE TO DRESS AND BATHE HIMSELF BUT FAMILY TRIES TO PROVIDE SUPERVISION BECAUSE OF HIS HX OF FALL AND WEAKNESS LEGS, ARMS    Thrombocytopenia (HCC)    Vitreous hemorrhage (HCC) 12/21/2013   OS 12/18/13 CT /MRI brain done for ataxia    Past Surgical History:  Past Surgical History:  Procedure Laterality Date   CYSTOSCOPY N/A 06/13/2013   Procedure: CYSTOSCOPY FLEXIBLE BEDSIDE;  Surgeon: Crist Fat, MD;  Location: [facility] OR;  Service: Urology;  Laterality: N/A;   MYRINGOTOMY WITH TUBE PLACEMENT Bilateral    TRANSURETHRAL RESECTION OF PROSTATE N/A 05/19/2013   Procedure: TRANSURETHRAL RESECTION OF THE PROSTATE WITH GYRUS INSTRUMENTS;  Surgeon: Kathi Ludwig, MD;  Location: [facility] ORS;  Service: Urology;  Laterality: N/A;   HPI:  Patient is a 89 year old male who presents following unresponsive episode with vomiting. MRI 5/18 with findings suspected to be artifactual. CXR 5/18 with no acute findings. Pt with a past medical history that includes stroke, hypertension, hyperlipidemia, CKD III, HFpEF, vascular dementia, meningioma, and BPH.    Assessment / Plan / Recommendation  Clinical Impression  Pt presents with functional swallowing as assessed clinically.  Pt tolerated all consistencies trialed, including large volume, serial straw sips with no clinical s/s of aspiration. Coughing noted following completion of bolus trials was very delayed to PO intake.  No concerns for pneumonia on CXR 5/18, and findings on MRI are suspected to be artifactual. Pt has no further ST needs at this time. SLP will sign off.    Recommend continuing regular diet with thin liquids.  SLP Visit Diagnosis: Dysphagia, unspecified (R13.10)    Aspiration Risk  No limitations    Diet Recommendation Regular;Thin liquid   Liquid Administration via: Cup;Straw Medication Administration:  (as tolerated) Supervision: Intermittent supervision to cue for compensatory strategies Compensations: Slow rate;Small sips/bites Postural Changes: Seated upright at 90 degrees    Other  Recommendations Oral Care Recommendations: Oral care BID  Recommendations for follow up therapy are one component of a multi-disciplinary discharge planning process, led by the attending physician.  Recommendations may be updated based on patient status, additional functional criteria and insurance authorization.  Follow up Recommendations  No SLP follow up      Assistance Recommended at Discharge  N/A  Functional Status Assessment Patient has not had a recent decline in their functional status  Frequency and Duration  (N/A)          Prognosis Prognosis for improved oropharyngeal function:  (N/A)      Swallow Study   General Date of Onset: 07/31/22 HPI: Patient is a 89 year old male who presents following unresponsive episode with vomiting. MRI 5/18 with findings suspected to be artifactual. CXR 5/18 with no acute findings. Pt with a past medical history that includes stroke, hypertension, hyperlipidemia, CKD III, HFpEF, vascular dementia, meningioma, and BPH. Type of Study: Bedside Swallow Evaluation Previous Swallow Assessment: none in EMR Diet Prior to this Study: Regular;Thin liquids (Level 0) Temperature Spikes Noted: No Respiratory Status: Room air History of Recent Intubation: No Behavior/Cognition: Alert;Cooperative;Pleasant mood Oral Cavity Assessment: Within Functional Limits Oral Care Completed by SLP: No Oral Cavity - Dentition: Dentures, bottom;Dentures, top Self-Feeding Abilities: Needs assist;Needs set up Patient Positioning: Upright in bed Baseline Vocal Quality:  (Harsh) Volitional Cough: Strong Volitional Swallow: Unable to elicit    Oral/Motor/Sensory Function Overall Oral Motor/Sensory Function: Within functional limits Facial ROM:  (unable to assess rounding) Facial Symmetry: Within Functional Limits Lingual ROM: Within Functional Limits Lingual Symmetry: Within Functional Limits Lingual Strength: Within Functional Limits Velum: Within Functional Limits Mandible: Within Functional Limits   Ice Chips Ice chips: Not tested   Thin Liquid Thin Liquid: Within functional limits Presentation: Straw    Nectar Thick Nectar Thick Liquid: Not tested   Honey Thick Honey Thick Liquid: Not tested   Puree Puree: Within functional limits Presentation: Spoon   Solid     Solid: Within functional  limits Presentation:  (SLP fed)      Kerrie Pleasure, MA, CCC-SLP Acute Rehabilitation Services Office: [phone] 07/31/2022,11:05 AM

## 2022-07-31 NOTE — Progress Notes (Signed)
Physical Therapy Treatment Patient Details Name: [name] MRN: [id number] DOB: [birth year] Today's Date: 07/31/2022   History of Present Illness JERYL GALAZ is a 89 y.o. male with history of cerebral infarct and dementia, known chronic left meningioma, admitted for episode of unresponsiveness followed by emesis.    PT Comments    Pt seen for PT tx with pt agreeable, granddaughter present in room. Pt's granddaughter asking questions about recovery & plan with PT answering questions to granddaughter's satisfaction. Pt continues to require max assist for bed mobility & + 2 for safe transfer bed>recliner. Pt is impacted by impaired cognition & HOH. Recommend ongoing PT services to address deficits noted.    Recommendations for follow up therapy are one component of a multi-disciplinary discharge planning process, led by the attending physician.  Recommendations may be updated based on patient status, additional functional criteria and insurance authorization.  Follow Up Recommendations  Can patient physically be transported by private vehicle: No    Assistance Recommended at Discharge Frequent or constant Supervision/Assistance  Patient can return home with the following Two people to help with walking and/or transfers;Assistance with cooking/housework;Assist for transportation;Help with stairs or ramp for entrance;A lot of help with bathing/dressing/bathroom   Equipment Recommendations  None recommended by PT    Recommendations for Other Services       Precautions / Restrictions Precautions Precautions: Fall Restrictions Weight Bearing Restrictions: No     Mobility  Bed Mobility Overal bed mobility: Needs Assistance Bed Mobility: Supine to Sit     Supine to sit: Max assist, HOB elevated     General bed mobility comments: assistance to move BLE to EOB, cuing to hold to bed rail & participate in coming to sitting EOB    Transfers Overall transfer level: Needs  assistance Equipment used: Rolling walker (2 wheels) Transfers: Sit to/from Stand Sit to Stand: Max assist   Step pivot transfers: Max assist, +2 physical assistance, +2 safety/equipment       General transfer comment: Pt transfers STS from elevated EOB with MAX assist +1. PT attempts to educate pt on safe hand placement & need to scoot out to edge of seat to increase ease of transfer but pt with limited ability to follow cuing (2/2 HOH & dementia). Pt continues to pull to standing with BUE on RW, significant posterior lean & reliance on BLE leaning back on EOB. Once in standing pt demonstrates R lateral lean with extra time to stabilize with RLE. Pt is able to take ~2 steps towards recliner on L with RW & max assist with extra time required to weight shift L<>R & advance either foot, decreased ability to step with RLE. During step pivot pt with significant LOB requiring PT & granddaughter to safely sit in recliner.    Ambulation/Gait                   Stairs             Wheelchair Mobility    Modified Rankin (Stroke Patients Only)       Balance Overall balance assessment: Needs assistance Sitting-balance support: No upper extremity supported, Feet supported Sitting balance-Leahy Scale: Poor   Postural control: Posterior lean Standing balance support: During functional activity, Reliant on assistive device for balance Standing balance-Leahy Scale: Zero Standing balance comment: R latereal lean, posterior lean in standing.  Cognition Arousal/Alertness: Awake/alert Behavior During Therapy: WFL for tasks assessed/performed Overall Cognitive Status: Difficult to assess                                 General Comments: Pt with hx of dementia, HOH also impacts communication & cognitive assessment. Pt requires extra time & multimodal cuing to follow simple commands throughout session. Decreased awareness of safety with  mobility.        Exercises      General Comments General comments (skin integrity, edema, etc.): Pt received in bed & observed to have condom catheter to have come off & pt's bed saturated with urine & pt seems unaware. PT assisted pt with donning clean gown.      Pertinent Vitals/Pain Pain Assessment Pain Assessment: Faces Faces Pain Scale: No hurt    Home Living                          Prior Function            PT Goals (current goals can now be found in the care plan section) Acute Rehab PT Goals Patient Stated Goal: go to rehab, get better PT Goal Formulation: With family Time For Goal Achievement: 08/13/22 Potential to Achieve Goals: Fair Progress towards PT goals: Progressing toward goals    Frequency    Min 2X/week      PT Plan Current plan remains appropriate    Co-evaluation              AM-PAC PT "6 Clicks" Mobility   Outcome Measure  Help needed turning from your back to your side while in a flat bed without using bedrails?: A Lot Help needed moving from lying on your back to sitting on the side of a flat bed without using bedrails?: Total Help needed moving to and from a bed to a chair (including a wheelchair)?: Total Help needed standing up from a chair using your arms (e.g., wheelchair or bedside chair)?: Total Help needed to walk in hospital room?: Total Help needed climbing 3-5 steps with a railing? : Total 6 Click Score: 7    End of Session Equipment Utilized During Treatment: Gait belt Activity Tolerance: Patient tolerated treatment well Patient left: in chair;with chair alarm set;with call bell/phone within reach;with family/visitor present Nurse Communication: Mobility status (IV noted to have come out of possibly R hand (unsure) but no bleeding noted) PT Visit Diagnosis: Unsteadiness on feet (R26.81);Other abnormalities of gait and mobility (R26.89);Muscle weakness (generalized) (M62.81);Difficulty in walking, not  elsewhere classified (R26.2)     Time: 9604-5409 PT Time Calculation (min) (ACUTE ONLY): 23 min  Charges:  $Therapeutic Activity: 23-37 mins                     Aleda Grana, PT, DPT 07/31/22, 2:21 PM  Sandi Mariscal 07/31/2022, 2:20 PM

## 2022-07-31 NOTE — TOC Initial Note (Signed)
Transition of Care [facility]) - Initial/Assessment Note    Patient Details  Name: [name] MRN: [id number] Date of Birth: 1924/09/04  Transition of Care [facility]) CM/SW Contact:    Lorri Frederick, LCSW Phone Number: 07/31/2022, 2:26 PM  Clinical Narrative:  Pt oriented x2, CSW spoke with granddaughter [name], who goes by [place].  She is aware of DC recommendation for SNF and in agreement.  Pt has been to [facility] in the past and that would be the first choice.  Pt lives at home with 2 grandsons, no current services.  Sheilah Pigeon asking for assistance with LTC options.  She applied for medicaid last year and pt was denied.  He has income of $1500 per month, no other assets.  CSW will make referral to financial counseling.  Email sent to financial counseling/[place]. Referral sent out in hub for SNF, reached out to [facility] to review.        1500: [facility] confirms they can accept pt.             Expected Discharge Plan: Skilled Nursing Facility Barriers to Discharge: Continued Medical Work up, SNF Pending bed offer   Patient Goals and CMS Choice     Choice offered to / list presented to :  (granddaughter)      Expected Discharge Plan and Services In-house Referral: Clinical Social Work   Post Acute Care Choice: Skilled Nursing Facility Living arrangements for the past 2 months: Single Family Home                                      Prior Living Arrangements/Services Living arrangements for the past 2 months: Single Family Home Lives with:: Relatives (lives with grandsons) Patient language and need for interpreter reviewed:: No        Need for Family Participation in Patient Care: Yes (Comment) Care giver support system in place?: Yes (comment) Current home services: Other (comment) (none) Criminal Activity/Legal Involvement Pertinent to Current Situation/Hospitalization: No - Comment as needed  Activities of Daily Living      Permission Sought/Granted                   Emotional Assessment Appearance:: Appears stated age Attitude/Demeanor/Rapport: Unable to Assess Affect (typically observed): Unable to Assess Orientation: : Oriented to Self, Oriented to Place      Admission diagnosis:  Syncope [R55] Hypoxia [R09.02] AKI (acute kidney injury) (HCC) [N17.9] Syncope, unspecified syncope type [R55] Patient Active Problem List   Diagnosis Date Noted   Syncope 07/29/2022   Episode of unresponsiveness 07/29/2022   AKI (acute kidney injury) (HCC) 07/29/2022   Pressure injury of skin 07/29/2022   Lung field abnormal finding on examination 05/31/2022   Irritable 05/12/2021   COVID-19 04/26/2021   Aortic aneurysm (HCC) 04/26/2021   Bilateral adrenal adenomas 04/26/2021   Cholelithiasis 04/26/2021   Nocturia 01/14/2021   Diverticulosis of colon 07/19/2020   Thrombocytopenia (HCC) 03/30/2020   Partial symptomatic epilepsy with complex partial seizures, not intractable, without status epilepticus (HCC) 04/17/2019   Chronic diastolic (congestive) heart failure (HCC) 10/28/2017   Cerebellar ataxia in diseases classified elsewhere (HCC) 02/13/2017   Vascular dementia (HCC) 01/28/2016   Presence of IVC filter 07/17/2014   CKD (chronic kidney disease) stage 3, GFR 30-59 ml/min (HCC) 06/23/2013   Hyperlipidemia    BPH (benign prostatic hyperplasia) 04/24/2013   Cerumen impaction 08/30/2012   Hearing loss 07/20/2011  Meningioma (HCC) 07/20/2011   HTN (hypertension) 07/19/2011   PCP:  Gust Rung, DO Pharmacy:   [facility] [phone] - [place], [place] - [address] AT [facility] [address] [facility] [address] [place] [address] Phone: [phone] Fax: [phone]  [facility] - [place], [place] - [address] [place] [address] Phone: [phone] Fax: [phone]     Social Determinants of Health (SDOH) Social History: SDOH Screenings   Food  Insecurity: Food Insecurity Present (05/30/2022)  Housing: Low Risk  (05/30/2022)  Transportation Needs: No Transportation Needs (05/30/2022)  Utilities: Not At Risk (05/30/2022)  Alcohol Screen: Low Risk  (05/30/2022)  Depression (PHQ2-9): Low Risk  (05/30/2022)  Financial Resource Strain: Low Risk  (05/30/2022)  Physical Activity: Inactive (05/30/2022)  Social Connections: Socially Isolated (05/30/2022)  Stress: No Stress Concern Present (05/30/2022)  Tobacco Use: Low Risk  (07/29/2022)   SDOH Interventions:     Readmission Risk Interventions     No data to display

## 2022-07-31 NOTE — Progress Notes (Addendum)
HD#0 Subjective:   Summary: [name] is a 89 y.o. male with pertinent PMH of cerebral infarct and dementia, known chronic left meningioma who presented with episode of unresponsiveness followed by emesis   Remembers came in due to throwing up. Oriented to place (hospital). Weaned to RA during interview. Can give son a call today.   Objective:  Vital signs in last 24 hours: Vitals:   07/30/22 0832 07/30/22 1434 07/30/22 2042 07/31/22 0445  BP: (!) 131/93 137/85 130/83 115/75  Pulse: 75 79 78 71  Resp: 18  19 16   Temp: 98.3 F (36.8 C) 98.4 F (36.9 C) 98.3 F (36.8 C)   TempSrc: Oral Oral Oral   SpO2: 93% 94% 92% 93%  Weight:      Height:       Supplemental O2: Room Air SpO2: 93 % O2 Flow Rate (L/min): 2 L/min   Physical Exam:  Constitutional: A&O, NAD Cardiovascular: irregular rate and rhythm, no m/r/g Pulmonary/Chest: Rhonchi diffusely, normal effort, no wheezing  Abdominal: soft, non-tender, non-distended, Neurological: alert & oriented x 3, No focal deficits noted Skin: warm and dry   Filed Weights   07/29/22 0101 07/29/22 1200  Weight: 83.6 kg 85.1 kg      Intake/Output Summary (Last 24 hours) at 07/31/2022 0659 Last data filed at 07/31/2022 0521 Gross per 24 hour  Intake --  Output 700 ml  Net -700 ml   Net IO Since Admission: -796.25 mL [07/31/22 0659]  Pertinent Labs:    Latest Ref Rng & Units 07/29/2022    1:16 AM 07/29/2022    1:05 AM 10/28/2021    2:20 PM  CBC  WBC 4.0 - 10.5 K/uL  7.6  13.1   Hemoglobin 13.0 - 17.0 g/dL 66.4  40.3  47.4   Hematocrit 39.0 - 52.0 % 42.0  42.7  41.5   Platelets 150 - 400 K/uL  124  121        Latest Ref Rng & Units 07/31/2022    1:07 AM 07/29/2022    3:22 PM 07/29/2022    1:16 AM  CMP  Glucose 70 - 99 mg/dL 259  563  875   BUN 8 - 23 mg/dL 30  30  44   Creatinine 0.61 - 1.24 mg/dL 6.43  3.29  5.18   Sodium 135 - 145 mmol/L 136  142  143   Potassium 3.5 - 5.1 mmol/L 4.2  4.6  4.7   Chloride 98 -  111 mmol/L 106  109  106   CO2 22 - 32 mmol/L 25  26    Calcium 8.9 - 10.3 mg/dL 9.5  9.8      Assessment/Plan:   Principal Problem:   Episode of unresponsiveness Active Problems:   Syncope   AKI (acute kidney injury) (HCC)   Pressure injury of skin   Patient Summary: PRESTAN BRAND is a 89 y.o. male with PMHx cerebral infarct and dementia, known chronic left meningioma who presented with episode of transient unresponsiveness followed by an episode.   Brief Unresponsive Episode Lasted ~5 minutes, then returned to baseline quickly. Has been at baseline since arriving to [facility] ED. MRI showed possible acute punctate cortical infarct however felt to be artifact per neurology. Otherwise no new findings. EEG was negative. Echo LV EF 65%-70% with severe LVH, no valvular dz. ECG showed sinus rhythm with prlonged PR interval. Review of telemetry shows sinus arrythmia with PACs, few short sinus pauses but no high degree AV  block.  -- Likely discharge today; will offer outpatient cardiology follow up to consider ambulatory monitoring   Dementia Hx CVAs -- Continue ASA and statin   AKI -- Improving, likely pre renal from vomiting  -- Good UOP    Dispo: Anticipated discharge to Skilled nursing facility - medically stable for discharge pending placement.   Rocky Morel, DO Internal Medicine Resident PGY-1 Pager: [phone]  Please contact the on call pager after 5 pm and on weekends at [phone].

## 2022-08-01 ENCOUNTER — Observation Stay (HOSPITAL_COMMUNITY): Payer: Medicare HMO

## 2022-08-01 DIAGNOSIS — Z743 Need for continuous supervision: Secondary | ICD-10-CM | POA: Diagnosis not present

## 2022-08-01 DIAGNOSIS — Z86718 Personal history of other venous thrombosis and embolism: Secondary | ICD-10-CM | POA: Diagnosis not present

## 2022-08-01 DIAGNOSIS — R41841 Cognitive communication deficit: Secondary | ICD-10-CM | POA: Diagnosis not present

## 2022-08-01 DIAGNOSIS — K8020 Calculus of gallbladder without cholecystitis without obstruction: Secondary | ICD-10-CM | POA: Diagnosis not present

## 2022-08-01 DIAGNOSIS — N179 Acute kidney failure, unspecified: Secondary | ICD-10-CM | POA: Diagnosis not present

## 2022-08-01 DIAGNOSIS — R1312 Dysphagia, oropharyngeal phase: Secondary | ICD-10-CM | POA: Diagnosis not present

## 2022-08-01 DIAGNOSIS — M6258 Muscle wasting and atrophy, not elsewhere classified, other site: Secondary | ICD-10-CM | POA: Diagnosis not present

## 2022-08-01 DIAGNOSIS — I714 Abdominal aortic aneurysm, without rupture, unspecified: Secondary | ICD-10-CM | POA: Diagnosis not present

## 2022-08-01 DIAGNOSIS — I7121 Aneurysm of the ascending aorta, without rupture: Secondary | ICD-10-CM | POA: Diagnosis not present

## 2022-08-01 DIAGNOSIS — F0390 Unspecified dementia without behavioral disturbance: Secondary | ICD-10-CM | POA: Diagnosis not present

## 2022-08-01 DIAGNOSIS — J9811 Atelectasis: Secondary | ICD-10-CM | POA: Diagnosis not present

## 2022-08-01 DIAGNOSIS — R2681 Unsteadiness on feet: Secondary | ICD-10-CM | POA: Diagnosis not present

## 2022-08-01 DIAGNOSIS — R2689 Other abnormalities of gait and mobility: Secondary | ICD-10-CM | POA: Diagnosis not present

## 2022-08-01 DIAGNOSIS — R1311 Dysphagia, oral phase: Secondary | ICD-10-CM | POA: Diagnosis not present

## 2022-08-01 DIAGNOSIS — R55 Syncope and collapse: Secondary | ICD-10-CM | POA: Diagnosis not present

## 2022-08-01 DIAGNOSIS — J9601 Acute respiratory failure with hypoxia: Secondary | ICD-10-CM | POA: Diagnosis not present

## 2022-08-01 DIAGNOSIS — R109 Unspecified abdominal pain: Secondary | ICD-10-CM | POA: Diagnosis not present

## 2022-08-01 DIAGNOSIS — I498 Other specified cardiac arrhythmias: Secondary | ICD-10-CM | POA: Diagnosis not present

## 2022-08-01 DIAGNOSIS — D329 Benign neoplasm of meninges, unspecified: Secondary | ICD-10-CM | POA: Diagnosis not present

## 2022-08-01 DIAGNOSIS — Z7982 Long term (current) use of aspirin: Secondary | ICD-10-CM | POA: Diagnosis not present

## 2022-08-01 DIAGNOSIS — G939 Disorder of brain, unspecified: Secondary | ICD-10-CM | POA: Diagnosis not present

## 2022-08-01 DIAGNOSIS — J189 Pneumonia, unspecified organism: Secondary | ICD-10-CM | POA: Diagnosis not present

## 2022-08-01 DIAGNOSIS — Z8673 Personal history of transient ischemic attack (TIA), and cerebral infarction without residual deficits: Secondary | ICD-10-CM | POA: Diagnosis not present

## 2022-08-01 DIAGNOSIS — I150 Renovascular hypertension: Secondary | ICD-10-CM | POA: Diagnosis not present

## 2022-08-01 DIAGNOSIS — R262 Difficulty in walking, not elsewhere classified: Secondary | ICD-10-CM | POA: Diagnosis not present

## 2022-08-01 DIAGNOSIS — I129 Hypertensive chronic kidney disease with stage 1 through stage 4 chronic kidney disease, or unspecified chronic kidney disease: Secondary | ICD-10-CM | POA: Diagnosis not present

## 2022-08-01 DIAGNOSIS — M6281 Muscle weakness (generalized): Secondary | ICD-10-CM | POA: Diagnosis not present

## 2022-08-01 DIAGNOSIS — N183 Chronic kidney disease, stage 3 unspecified: Secondary | ICD-10-CM | POA: Diagnosis not present

## 2022-08-01 DIAGNOSIS — F4489 Other dissociative and conversion disorders: Secondary | ICD-10-CM | POA: Diagnosis not present

## 2022-08-01 DIAGNOSIS — G40909 Epilepsy, unspecified, not intractable, without status epilepticus: Secondary | ICD-10-CM | POA: Diagnosis not present

## 2022-08-01 DIAGNOSIS — N1831 Chronic kidney disease, stage 3a: Secondary | ICD-10-CM | POA: Diagnosis not present

## 2022-08-01 LAB — BASIC METABOLIC PANEL
Anion gap: 4 — ABNORMAL LOW (ref 5–15)
BUN: 26 mg/dL — ABNORMAL HIGH (ref 8–23)
CO2: 24 mmol/L (ref 22–32)
Calcium: 9.4 mg/dL (ref 8.9–10.3)
Chloride: 107 mmol/L (ref 98–111)
Creatinine, Ser: 1.39 mg/dL — ABNORMAL HIGH (ref 0.61–1.24)
GFR, Estimated: 47 mL/min — ABNORMAL LOW (ref >60)
Glucose, Bld: 103 mg/dL — ABNORMAL HIGH (ref 70–99)
Potassium: 4.2 mmol/L (ref 3.5–5.1)
Sodium: 135 mmol/L (ref 135–145)

## 2022-08-01 MED ORDER — ATORVASTATIN CALCIUM 10 MG PO TABS: 10.0000 mg | ORAL_TABLET | Freq: Every day | ORAL | Status: DC

## 2022-08-01 MED ORDER — IOHEXOL 350 MG/ML SOLN
75.0000 mL | Freq: Once | INTRAVENOUS | Status: AC | PRN
  Administered 2022-08-01: 75 mL via INTRAVENOUS

## 2022-08-01 NOTE — TOC Progression Note (Addendum)
Transition of Care [facility]) - Progression Note    Patient Details  Name: [name] MRN: [id number] Date of Birth: 05/22/1924  [facility]) CM/SW Contact  Lorri Frederick, LCSW Phone Number: 08/01/2022, 8:56 AM  Clinical Narrative:   SNF auth request submitted in [place].   1120: Auth approved: [id number], [id number], 3 days: 5/21-5/23. MD notified.  CSW confirmed with [facility] that they can receive pt today.   Expected Discharge Plan: Skilled Nursing Facility Barriers to Discharge: Continued Medical Work up, SNF Pending bed offer  Expected Discharge Plan and Services In-house Referral: Clinical Social Work   Post Acute Care Choice: Skilled Nursing Facility Living arrangements for the past 2 months: Single Family Home                                       Social Determinants of Health (SDOH) Interventions SDOH Screenings   Food Insecurity: Food Insecurity Present (05/30/2022)  Housing: Low Risk  (05/30/2022)  Transportation Needs: No Transportation Needs (05/30/2022)  Utilities: Not At Risk (05/30/2022)  Alcohol Screen: Low Risk  (05/30/2022)  Depression (PHQ2-9): Low Risk  (05/30/2022)  Financial Resource Strain: Low Risk  (05/30/2022)  Physical Activity: Inactive (05/30/2022)  Social Connections: Socially Isolated (05/30/2022)  Stress: No Stress Concern Present (05/30/2022)  Tobacco Use: Low Risk  (07/29/2022)    Readmission Risk Interventions     No data to display

## 2022-08-01 NOTE — Discharge Summary (Signed)
Name: [name] MRN: [id number] DOB: [birth year] 89 y.o. PCP: Gust Rung, DO  Date of Admission: 07/29/2022 12:42 AM Date of Discharge: 08/01/2022 Attending Physician: Dr. Antony Contras  Discharge Diagnosis: Principal Problem:   Episode of unresponsiveness Active Problems:   Syncope   AKI (acute kidney injury) (HCC)   Pressure injury of skin    Discharge Medications: Allergies as of 08/01/2022   No Known Allergies      Medication List     STOP taking these medications    furosemide 40 MG tablet Commonly known as: Lasix   triamcinolone ointment 0.1 % Commonly known as: KENALOG       TAKE these medications    amLODipine 5 MG tablet Commonly known as: NORVASC Take 1 tablet (5 mg total) by mouth daily.   aspirin EC 81 MG tablet Take 1 tablet (81 mg total) by mouth daily.   atorvastatin 10 MG tablet Commonly known as: LIPITOR Take 1 tablet (10 mg total) by mouth daily. Start taking on: Aug 02, 2022               Durable Medical Equipment  (From admission, onward)           Start     Ordered   08/01/22 0000  For home use only DME oxygen       Comments: 2-4L to maintain O2 sats above 90%.  Question Answer Comment  Length of Need 6 Months   Mode or (Route) Nasal cannula   Oxygen delivery system Gas      08/01/22 1532              Discharge Care Instructions  (From admission, onward)           Start     Ordered   08/01/22 0000  Discharge wound care:       Comments: Soft heel padding while in bed, change daily.   08/01/22 1508   08/01/22 0000  Discharge wound care:       Comments: Soft heel bandage. Change daily.   08/01/22 1534            Disposition and follow-up:   [name] was discharged from [facility] in Stable condition.  At the hospital follow up visit please address:  1.  Follow-up:  a. Ambulate with pulse ox at clinic follow up.  2.  Labs / imaging needed at time of  follow-up: none  3.  Pending labs/ test needing follow-up: none  Follow-up Appointments:  Contact information for after-discharge care     Destination     [facility] Preferred SNF .   Service: Skilled Nursing Contact information: [address] [place] [address] [phone]                     Hospital Course by problem list: [name] is a 89 y.o. male with PMHx cerebral infarct and dementia, known chronic left meningioma who presented with episode of transient unresponsiveness.  Unresponsive episode Presented after brief episode of unresponsiveness followed by emesis. History notable for cerebral infarct with associated focal seizure and chronic left clinoidal meningioma. Worked up for new cerebral infarct and seizure. MRI showed signal abnormality that is equivocal for stroke, could be artifactual. EEG showed cortical irritability on left, perhaps from meningioma. Differential includes cardiac syncope, telemetry was notable for sinus arrhythmia without prolonged sinus pauses or high grade AV node block.  Family  would not like further cardiac workup with ambulatory monitor at this time.  Further workup and treatment for underlying cause should be guided by patient preference for comfort-oriented care rather than intense or invasive monitoring and treatment.  Remained asymptomatic during admission and was transferred to SNF.  AKI on CKD 3a Creatinine around 2 on admission, up from baseline. BUN/creatinine ratio and clinical presentation suggestive of pre-renal azotemia. Treated with IV fluids to good effect.  Hypoxia Noted to be hypoxic to 88-87% today on rounds; complaining of SOB. Lungs with diffuse rhonchi bilaterally, no wheezing. He is a never smoker. Reviewed admission CXR that looked unremarkable aside from deviation and thickening of his trachea, thought to be related to his known aortic aneurysm and dilated SVC. He  is overdue for repeat CTA chest.  This was obtained and findings were stable from prior with some atelectasis. Hypoxia most likely secondary to the atelectasis. Ambulatory pulse ox showed desaturations and patient was discharged with oxygen and incentive spirometer. Will expect this to improve and plan to walk with pulse ox at clinic follow up.   HTN Patient is on amlodipine 5 mg daily outpatient.  Blood pressures fluctuate here from normotensive to slightly hypertensive.  Restarted this medication prior to discharge.    Discharge Subjective: Doing well this morning without complaints but did eventually endorse some trouble breathing. No cough   Discharge Exam:   BP (!) 147/98 (BP Location: Left Arm)   Pulse 84   Temp 98.2 F (36.8 C) (Oral)   Resp 17   Ht 5\' 8"  (1.727 m)   Wt 85.1 kg   SpO2 93%   BMI 28.53 kg/m  Constitutional: A&O, NAD Cardiovascular: irregular rate and rhythm, no m/r/g Pulmonary/Chest: Rhonchi diffusely, normal effort, no wheezing  Abdominal: soft, non-tender, non-distended, Neurological: alert & oriented x 3, No focal deficits noted Skin: warm and dry  Pertinent Labs, Studies, and Procedures:     Latest Ref Rng & Units 07/29/2022    1:16 AM 07/29/2022    1:05 AM 10/28/2021    2:20 PM  CBC  WBC 4.0 - 10.5 K/uL  7.6  13.1   Hemoglobin 13.0 - 17.0 g/dL 16.1  09.6  04.5   Hematocrit 39.0 - 52.0 % 42.0  42.7  41.5   Platelets 150 - 400 K/uL  124  121        Latest Ref Rng & Units 08/01/2022    3:19 AM 07/31/2022    1:07 AM 07/29/2022    3:22 PM  CMP  Glucose 70 - 99 mg/dL 409  811  914   BUN 8 - 23 mg/dL 26  30  30    Creatinine 0.61 - 1.24 mg/dL 7.82  9.56  2.13   Sodium 135 - 145 mmol/L 135  136  142   Potassium 3.5 - 5.1 mmol/L 4.2  4.2  4.6   Chloride 98 - 111 mmol/L 107  106  109   CO2 22 - 32 mmol/L 24  25  26    Calcium 8.9 - 10.3 mg/dL 9.4  9.5  9.8     ECHOCARDIOGRAM COMPLETE  Result Date: 07/30/2022    ECHOCARDIOGRAM REPORT   Patient Name:    [name] Date of Exam: 07/30/2022 Medical Rec #:  [id number]       Height:       68.0 in Accession #:    [id number]      Weight:       187.6 lb Date of Birth:  1924-05-14       BSA:          1.989 m Patient Age:    89 years        BP:           131/93 mmHg Patient Gender: M               HR:           86 bpm. Exam Location:  Inpatient Procedure: 2D Echo, Cardiac Doppler and Color Doppler Indications:    Stroke  History:        Patient has prior history of Echocardiogram examinations, most                 recent 10/08/2018. CHF, Signs/Symptoms:Syncope, Alzheimer's,                 Altered Mental Status and Dyspnea; Risk Factors:Hypertension and                 Dyslipidemia.  Sonographer:    Sheralyn Boatman RDCS Referring Phys: Evlyn Kanner  Sonographer Comments: Technically difficult study due to poor echo windows. IMPRESSIONS  1. Left ventricular ejection fraction, by estimation, is 65 to 70%. The left ventricle has normal function. The left ventricle has no regional wall motion abnormalities. There is severe concentric left ventricular hypertrophy. Indeterminate diastolic filling due to E-A fusion.  2. Right ventricular systolic function is normal. The right ventricular size is normal. Tricuspid regurgitation signal is inadequate for assessing PA pressure.  3. The mitral valve is grossly normal. Trivial mitral valve regurgitation. No evidence of mitral stenosis.  4. The aortic valve is tricuspid. Aortic valve regurgitation is trivial. No aortic stenosis is present.  5. Aortic dilatation noted. Aneurysm of the ascending aorta, measuring 46 mm.  6. The inferior vena cava is normal in size with greater than 50% respiratory variability, suggesting right atrial pressure of 3 mmHg. FINDINGS  Left Ventricle: Left ventricular ejection fraction, by estimation, is 65 to 70%. The left ventricle has normal function. The left ventricle has no regional wall motion abnormalities. The left ventricular internal cavity size was  normal in size. There is  severe concentric left ventricular hypertrophy. Indeterminate diastolic filling due to E-A fusion. Right Ventricle: The right ventricular size is normal. No increase in right ventricular wall thickness. Right ventricular systolic function is normal. Tricuspid regurgitation signal is inadequate for assessing PA pressure. Left Atrium: Left atrial size was normal in size. Right Atrium: Right atrial size was normal in size. Pericardium: There is no evidence of pericardial effusion. Presence of epicardial fat layer. Mitral Valve: The mitral valve is grossly normal. Trivial mitral valve regurgitation. No evidence of mitral valve stenosis. Tricuspid Valve: The tricuspid valve is grossly normal. Tricuspid valve regurgitation is trivial. No evidence of tricuspid stenosis. Aortic Valve: The aortic valve is tricuspid. Aortic valve regurgitation is trivial. No aortic stenosis is present. Aortic valve mean gradient measures 4.0 mmHg. Aortic valve peak gradient measures 8.4 mmHg. Aortic valve area, by VTI measures 3.49 cm. Pulmonic Valve: The pulmonic valve was grossly normal. Pulmonic valve regurgitation is not visualized. No evidence of pulmonic stenosis. Aorta: Aortic dilatation noted and the aortic root is normal in size and structure. There is an aneurysm involving the ascending aorta measuring 46 mm. Venous: The inferior vena cava is normal in size with greater than 50% respiratory variability, suggesting right atrial pressure of 3 mmHg. IAS/Shunts: The atrial septum is grossly normal.  LEFT VENTRICLE PLAX 2D LVIDd:  3.90 cm     Diastology LVIDs:         3.00 cm     LV e' medial:    7.72 cm/s LV PW:         1.80 cm     LV E/e' medial:  6.0 LV IVS:        1.90 cm     LV e' lateral:   10.90 cm/s LVOT diam:     2.30 cm     LV E/e' lateral: 4.3 LV SV:         96 LV SV Index:   48 LVOT Area:     4.15 cm  LV Volumes (MOD) LV vol d, MOD A2C: 95.9 ml LV vol d, MOD A4C: 84.6 ml LV vol s, MOD A2C:  38.4 ml LV vol s, MOD A4C: 50.1 ml LV SV MOD A2C:     57.5 ml LV SV MOD A4C:     84.6 ml LV SV MOD BP:      42.0 ml RIGHT VENTRICLE             IVC RV S prime:     16.80 cm/s  IVC diam: 1.20 cm TAPSE (M-mode): 1.7 cm LEFT ATRIUM           Index        RIGHT ATRIUM           Index LA diam:      4.60 cm 2.31 cm/m   RA Area:     12.00 cm LA Vol (A2C): 37.5 ml 18.85 ml/m  RA Volume:   26.30 ml  13.22 ml/m LA Vol (A4C): 35.6 ml 17.90 ml/m  AORTIC VALVE AV Area (Vmax):    3.81 cm AV Area (Vmean):   3.69 cm AV Area (VTI):     3.49 cm AV Vmax:           145.00 cm/s AV Vmean:          94.400 cm/s AV VTI:            0.275 m AV Peak Grad:      8.4 mmHg AV Mean Grad:      4.0 mmHg LVOT Vmax:         133.00 cm/s LVOT Vmean:        83.900 cm/s LVOT VTI:          0.231 m LVOT/AV VTI ratio: 0.84  AORTA Ao Root diam: 3.70 cm Ao Asc diam:  4.60 cm MITRAL VALVE MV Area (PHT): 3.31 cm    SHUNTS MV Decel Time: 229 msec    Systemic VTI:  0.23 m MV E velocity: 46.60 cm/s  Systemic Diam: 2.30 cm MV A velocity: 87.22 cm/s MV E/A ratio:  0.53 Lennie Odor MD Electronically signed by Lennie Odor MD Signature Date/Time: 07/30/2022/3:29:29 PM    Final    CT ANGIO HEAD NECK W WO CM  Result Date: 07/29/2022 CLINICAL DATA:  Initial evaluation for neuro deficit, stroke. EXAM: CT ANGIOGRAPHY HEAD AND NECK WITH AND WITHOUT CONTRAST TECHNIQUE: Multidetector CT imaging of the head and neck was performed using the standard protocol during bolus administration of intravenous contrast. Multiplanar CT image reconstructions and MIPs were obtained to evaluate the vascular anatomy. Carotid stenosis measurements (when applicable) are obtained utilizing NASCET criteria, using the distal internal carotid diameter as the denominator. RADIATION DOSE REDUCTION: This exam was performed according to the departmental dose-optimization program which includes automated exposure control, adjustment of the mA  and/or kV according to patient size and/or use  of iterative reconstruction technique. CONTRAST:  75mL OMNIPAQUE IOHEXOL 350 MG/ML SOLN COMPARISON:  Prior studies from earlier the same day. FINDINGS: CT HEAD FINDINGS Brain: Atrophy with chronic microvascular ischemic disease with multiple chronic ischemic infarcts again noted. Previously identified tiny ischemic infarct not visible by CT. No other acute large vessel territory infarct. No acute intracranial hemorrhage. Left anterior cranial fossa meningioma again noted, stable. No other mass effect or midline shift. Ventricular prominence related to global parenchymal volume loss of hydrocephalus. No extra-axial fluid collection. Vascular: No abnormal hyperdense vessel. Skull: Scalp soft tissues and calvarium demonstrate no new finding. Sinuses/Orbits: Chronic/remote posttraumatic defect at the left lamina papyracea. No acute finding. Paranasal sinuses are clear. No mastoid effusion. Other: None. Review of the MIP images confirms the above findings CTA NECK FINDINGS Aortic arch: Visualized ascending aorta dilated up to 4.4 cm. Mild atheromatous change about the arch itself. No stenosis about the origin of the great vessels. Right carotid system: Right common and internal carotid arteries are tortuous without dissection. No hemodynamically significant stenosis about the right carotid artery system. Left carotid system: Left common and internal carotid arteries are tortuous without dissection. No hemodynamically significant stenosis about the left carotid artery system. Vertebral arteries: Left vertebral artery arises directly from the aortic arch. Vertebral arteries are patent without stenosis or dissection. Skeleton: No discrete or worrisome osseous lesions. Reversal of the normal cervical lordosis with advanced cervical spondylosis. Other neck: No other acute finding within the neck. Upper chest: Visualized upper chest demonstrates no acute finding. Review of the MIP images confirms the above findings CTA HEAD  FINDINGS Anterior circulation: Both internal carotid arteries are widely patent through the siphons without stenosis. A1 segments, anterior greater complex common anterior cerebral arteries patent without stenosis. No M1 stenosis or occlusion. No proximal MCA branch occlusion or high-grade stenosis bowed distal MCA branches perfused and symmetric. Posterior circulation: Both V4 segments widely patent. Left PICA patent. Right PICA not seen. Basilar widely patent without stenosis. Superior cerebellar and posterior cerebral arteries widely patent bilaterally. Venous sinuses: Patent allowing for timing the contrast bolus. Anatomic variants: None significant.  No aneurysm. Review of the MIP images confirms the above findings IMPRESSION: CT HEAD IMPRESSION: 1. No acute intracranial abnormality. Previously identified tiny ischemic infarct not visible by CT. 2. Underlying atrophy with advanced chronic microvascular ischemic disease, with multiple chronic ischemic infarcts, stable. 3. Stable left anterior cranial fossa meningioma. CTA HEAD AND NECK IMPRESSION: 1. Negative CTA for large vessel occlusion or other emergent finding. 2. Diffuse tortuosity about the major arterial vasculature of the head and neck, suggesting chronic underlying hypertension. 3. No hemodynamically significant or correctable stenosis about the major arterial vasculature of the head and neck. 4. Ectatic ascending aorta measuring up to 4.4 cm in diameter. Recommend annual imaging followup by CTA or MRA. This recommendation follows 2010 ACCF/AHA/AATS/ACR/ASA/SCA/SCAI/SIR/STS/SVM Guidelines for the Diagnosis and Management of Patients with Thoracic Aortic Disease. Circulation. 2010; 121: Z610-R604. Aortic aneurysm NOS (ICD10-I71.9) Aortic Atherosclerosis (ICD10-I70.0). Electronically Signed   By: Rise Mu M.D.   On: 07/29/2022 22:37   EEG adult  Result Date: 07/29/2022 Charlsie Quest, MD     07/29/2022  4:06 PM Patient Name: JERZY OBROCHTA MRN: [id number] Epilepsy Attending: Charlsie Quest Referring Physician/Provider: Evlyn Kanner, MD Date: 07/29/2022 Duration: 27.08 mins Patient history: 89yo m with episode of unresponsive for a few seconds, drooling, and was brought to the ground at the home .  EEG to evaluate for seizure Level of alertness: Awake AEDs during EEG study: None Technical aspects: This EEG study was done with scalp electrodes positioned according to the 10-20 International system of electrode placement. Electrical activity was reviewed with band pass filter of 1-70Hz , sensitivity of 7 uV/mm, display speed of 35mm/sec with a 60Hz  notched filter applied as appropriate. EEG data were recorded continuously and digitally stored.  Video monitoring was available and reviewed as appropriate. Description: The posterior dominant rhythm consists of 8 Hz activity of moderate voltage (25-35 uV) seen predominantly in posterior head regions, symmetric and reactive to eye opening and eye closing. EEG showed intermittent 2-3hz  delta slowing in left anterior  temporal region. Hyperventilation and photic stimulation were not performed.   ABNORMALITY - Intermittent slow, left anterior temporal region. IMPRESSION: This study is suggestive of cortical dysfunction arising from left anterior temporal region likely secondary to underlying meningioma. No seizures or epileptiform discharges were seen throughout the recording. Please note that lack of epileptiform activity during interictal EEG does not exclude the diagnosis of epilepsy. Charlsie Quest   MR Brain Wo Contrast (neuro protocol)  Result Date: 07/29/2022 CLINICAL DATA:  89 year old male with altered mental status and age indeterminate left cerebellum abnormality on head CT this morning. Chronic left clinoid meningioma. EXAM: MRI HEAD WITHOUT CONTRAST TECHNIQUE: Multiplanar, multiecho pulse sequences of the brain and surrounding structures were obtained without intravenous contrast.  COMPARISON:  Head CT 0148 hours today. Previous brain MRI 03/11/2020. FINDINGS: Brain: Possible punctate cortical restricted diffusion posterior left superior temporal gyrus visible on both series 7, image 52 and series 5, image 81. No other convincing restricted diffusion. But severe chronic ischemic disease. Extensive chronic cerebellar ischemia right greater than left. Chronic microhemorrhage in the left cerebellum is stable. Brainstem volume loss. Extensive bilateral cerebral white matter T2 and FLAIR hyperintensity with chronic encephalomalacia in the bilateral PCA (progressed since 2021) and posterior right MCA territories. Superimposed chronic round left anterior clinoid region meningioma, 2.5 x 2.8 cm. Chronic mass effect there on the inferior left frontal lobe. Cannot exclude chronic regional vasogenic edema also, but stable since 2021. No midline shift or significant intracranial mass effect. Stable ventricle size and configuration. Cervicomedullary junction and pituitary are within normal limits. Vascular: Major intracranial vascular flow voids are stable since 2021. Skull and upper cervical spine: Cervical spine degeneration better demonstrated on the previous MRI. Visualized bone marrow signal is within normal limits. Sinuses/Orbits: Stable orbits. Paranasal sinuses and mastoids are stable and well aerated. Other: No acute scalp soft tissue finding. IMPRESSION: 1. Possible punctate acute cortical infarct in the posterior left superior temporal gyrus. No associated hemorrhage or mass effect. 2. No other acute intracranial abnormality, but underlying very advanced chronic ischemic disease. 3. Chronic left anterior clinoid region Meningioma. Regional mass effect and possible associated chronic cerebral edema are stable since 2021. Electronically Signed   By: Odessa Fleming M.D.   On: 07/29/2022 11:33   CT Head Wo Contrast  Result Date: 07/29/2022 CLINICAL DATA:  Initial evaluation for mental status change,  unknown cause. EXAM: CT HEAD WITHOUT CONTRAST TECHNIQUE: Contiguous axial images were obtained from the base of the skull through the vertex without intravenous contrast. RADIATION DOSE REDUCTION: This exam was performed according to the departmental dose-optimization program which includes automated exposure control, adjustment of the mA and/or kV according to patient size and/or use of iterative reconstruction technique. COMPARISON:  Prior study from 04/26/2021. FINDINGS: Brain: Moderately advanced cerebral atrophy with advanced chronic microvascular ischemic disease.  Chronic ischemic changes noted involving the right frontal and parietal lobes. Remote right greater than left cerebellar infarcts. There is new and/or increased parenchymal hypodensity involving the left cerebellum (series 3, image 9), age indeterminate, but could reflect acute to subacute ischemic changes. Superimposed subtle hyperdensity at this location could reflect petechial blood products. No other visible large vessel territory infarct. No acute intracranial hemorrhage. 2.3 cm meningioma at the left supraorbital region noted. Probable mild localized edema, similar. No other mass lesion or midline shift. Diffuse ventricular prominence related to global parenchymal volume loss of hydrocephalus. No extra-axial fluid collection. Vascular: No convincing abnormal hyperdense vessel. Scattered calcified atherosclerosis present at the skull base. Skull: Scalp soft tissues within normal limits.  Calvarium intact. Sinuses/Orbits: Globes and orbital soft tissues within normal limits. Paranasal sinuses are clear. No mastoid effusion. Other: None. IMPRESSION: 1. New parenchymal hypodensity involving the left cerebellum, age indeterminate, but could reflect evolving acute to subacute ischemic changes. Possible superimposed petechial blood products. Correlation with dedicated MRI could be performed for further evaluation as warranted. 2. No other acute  intracranial abnormality. 3. Atrophy with advanced chronic microvascular ischemic disease and multiple remote infarcts as above. 4. 2.3 cm left anterior cranial fossa meningioma, relatively stable. Electronically Signed   By: Rise Mu M.D.   On: 07/29/2022 02:34   CT ABDOMEN PELVIS WO CONTRAST  Result Date: 07/29/2022 CLINICAL DATA:  Abdominal pain, acute, nonlocalized EXAM: CT ABDOMEN AND PELVIS WITHOUT CONTRAST TECHNIQUE: Multidetector CT imaging of the abdomen and pelvis was performed following the standard protocol without IV contrast. RADIATION DOSE REDUCTION: This exam was performed according to the departmental dose-optimization program which includes automated exposure control, adjustment of the mA and/or kV according to patient size and/or use of iterative reconstruction technique. COMPARISON:  07/18/2020 FINDINGS: Lower chest: No acute abnormality. Hepatobiliary: Cholelithiasis without pericholecystic inflammatory change. Multiple simple cysts are seen scattered throughout the liver. These appear unchanged from prior examination. No focal intrahepatic mass identified on this noncontrast examination. No intra or extrahepatic biliary ductal dilation. Pancreas: Unremarkable Spleen: Unremarkable Adrenals/Urinary Tract: Stable 2.3 cm left adrenal adenoma, definitively characterized on MRI examination of 09/15/2013. No follow-up imaging is recommended for this lesion. The right adrenal gland is unremarkable. The kidneys are normal in position. Moderate bilateral renal cortical atrophy. Multiple simple cortical cysts are seen within the kidneys bilaterally for which no follow-up imaging is recommended. No intrarenal or ureteral calculi. No hydronephrosis. Bladder unremarkable. Stomach/Bowel: Moderate to severe pancolonic diverticulosis, most severe within the sigmoid colon. Redundant sigmoid colon noted. The stomach, small bowel, and large bowel are otherwise unremarkable. No evidence of  obstruction or focal inflammation. Appendix normal. No free intraperitoneal gas or fluid. 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 No abdominal aortic aneurysm. Inferior vena cava filter is seen in place within the infrarenal cava. No pathologic adenopathy within the abdomen and pelvis. Reproductive: Prostate is unremarkable. Other: Tiny fat containing umbilical hernia Musculoskeletal: Degenerative changes are seen within the visualized thoracolumbar spine. No acute bone abnormality. No lytic or blastic bone lesion. IMPRESSION: 1. No acute intra-abdominal pathology identified. 2. Cholelithiasis. 3. Stable 2.3 cm left adrenal adenoma. No follow-up imaging is recommended. 4. Moderate to severe pancolonic diverticulosis without superimposed acute inflammatory change. 5. 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 Aortic Atherosclerosis (ICD10-I70.0). Electronically Signed   By: Helyn Numbers M.D.   On: 07/29/2022 02:22   DG Chest [facility] [address]  Result Date: 07/29/2022 CLINICAL DATA:  Syncope EXAM: PORTABLE CHEST 1 VIEW COMPARISON:  Chest radiographs 10/28/2021 FINDINGS: Low lung volumes accentuate cardiomediastinal silhouette and pulmonary vascularity. There is rightward deviation of the trachea with ill-defined thickening of the right paratracheal stripe. This appears similar to prior and is likely due to a combination of aortic aneurysm and dilated SVC given appearance on CT chest 04/26/2021. No focal consolidation, pleural effusion, or pneumothorax. No displaced rib fractures. IMPRESSION: No acute cardiopulmonary abnormalities. Calcified tortuous aorta. Electronically Signed   By: Minerva Fester M.D.   On: 07/29/2022 01:19     Discharge Instructions: Discharge Instructions     Call MD for:  difficulty breathing, headache or visual disturbances   Complete by: As  directed    Call MD for:  extreme fatigue   Complete by: As directed    Call MD for:  persistant dizziness or light-headedness   Complete by: As directed    Call MD for:  persistant nausea and vomiting   Complete by: As directed    Call MD for:  severe uncontrolled pain   Complete by: As directed    Call MD for:  temperature >100.4   Complete by: As directed    Diet - low sodium heart healthy   Complete by: As directed    Discharge wound care:   Complete by: As directed    Soft heel padding while in bed, change daily.   Discharge wound care:   Complete by: As directed    Soft heel bandage. Change daily.   For home use only DME oxygen   Complete by: As directed    2-4L to maintain O2 sats above 90%.   Length of Need: 6 Months   Mode or (Route): Nasal cannula   Oxygen delivery system: Gas   Increase activity slowly   Complete by: As directed        Signed: Rocky Morel, DO 08/01/2022, 3:35 PM   Pager: [phone]

## 2022-08-01 NOTE — Progress Notes (Signed)
HD#0 Subjective:   Summary: [name] is a 89 y.o. male with pertinent PMH of cerebral infarct and dementia, known chronic left meningioma who presented with episode of unresponsiveness followed by emesis   Doing well this morning without complaints but did eventually endorse some trouble breathing. No cough.   Objective:  Vital signs in last 24 hours: Vitals:   07/31/22 1617 07/31/22 2005 08/01/22 0052 08/01/22 0448  BP: 130/76 (!) 158/89 (!) 149/90 (!) 142/85  Pulse: 68 64 66 66  Resp: 18 18 18 18   Temp: 98.3 F (36.8 C) 98.2 F (36.8 C) 98 F (36.7 C) 98.5 F (36.9 C)  TempSrc: Oral Oral Oral Oral  SpO2: 94% 94% 92% 91%  Weight:      Height:       Supplemental O2: Room Air SpO2: 91 % O2 Flow Rate (L/min): 2 L/min   Physical Exam:  Constitutional: A&O, NAD Cardiovascular: irregular rate and rhythm, no m/r/g Pulmonary/Chest: Rhonchi diffusely, normal effort, no wheezing  Abdominal: soft, non-tender, non-distended, Neurological: alert & oriented x 3, No focal deficits noted Skin: warm and dry   Filed Weights   07/29/22 0101 07/29/22 1200  Weight: 83.6 kg 85.1 kg     No intake or output data in the 24 hours ending 08/01/22 0651  Net IO Since Admission: -796.25 mL [08/01/22 0651]  Pertinent Labs:    Latest Ref Rng & Units 07/29/2022    1:16 AM 07/29/2022    1:05 AM 10/28/2021    2:20 PM  CBC  WBC 4.0 - 10.5 K/uL  7.6  13.1   Hemoglobin 13.0 - 17.0 g/dL 16.1  09.6  04.5   Hematocrit 39.0 - 52.0 % 42.0  42.7  41.5   Platelets 150 - 400 K/uL  124  121        Latest Ref Rng & Units 08/01/2022    3:19 AM 07/31/2022    1:07 AM 07/29/2022    3:22 PM  CMP  Glucose 70 - 99 mg/dL 409  811  914   BUN 8 - 23 mg/dL 26  30  30    Creatinine 0.61 - 1.24 mg/dL 7.82  9.56  2.13   Sodium 135 - 145 mmol/L 135  136  142   Potassium 3.5 - 5.1 mmol/L 4.2  4.2  4.6   Chloride 98 - 111 mmol/L 107  106  109   CO2 22 - 32 mmol/L 24  25  26    Calcium 8.9 - 10.3 mg/dL  9.4  9.5  9.8     Assessment/Plan:   Principal Problem:   Episode of unresponsiveness Active Problems:   Syncope   AKI (acute kidney injury) (HCC)   Pressure injury of skin   Patient Summary: [name] is a 89 y.o. male with PMHx cerebral infarct and dementia, known chronic left meningioma who presented with episode of transient unresponsiveness followed by an episode.   Hypoxia Noted to be hypoxic to 88-87% today on rounds; complaining of SOB. Lungs with diffuse rhonchi bilaterally, no wheezing. He is a never smoker. Reviewed admission CXR that looked unremarkable aside from deviation and thickening of his trachea, thought to be related to his known aortic aneurysm and dilated SVC. He is overdue for repeat CTA chest, will obtain this today and hold off on discharge pending results.  -- Oxygen PRN -- Ambulatory pulse ox -- Repeat CTA Chest  Brief Unresponsive Episode Lasted ~5 minutes, then returned to baseline quickly. Has been  at baseline since arriving to [facility] ED. MRI showed possible acute punctate cortical infarct however felt to be artifact per neurology. Otherwise no new findings. EEG was negative. Echo LV EF 65%-70% with severe LVH, no valvular dz. ECG showed sinus rhythm with prlonged PR interval. Review of telemetry shows sinus arrythmia with PACs, few short sinus pauses but no high degree AV block.  Grandson does not want further ambulatory cardiac monitoring. -- Likely discharge today pending SNF approval  Dementia Hx CVAs -- Continue ASA and statin   AKI -- Improving, likely pre renal from vomiting  -- Good UOP   HTN Blood pressure moderately elevated here, today 142/85.  Has been on amlodipine 5 mg daily outpatient.  Will consider resuming but weigh risks of hypotension which may have contributed to his unresponsive event.  Dispo: Anticipated discharge to Skilled nursing facility - medically stable for discharge pending further work up of his hypoxia.   Rocky Morel, DO Internal Medicine Resident PGY-1 Pager: [phone]  Please contact the on call pager after 5 pm and on weekends at [phone].

## 2022-08-01 NOTE — Progress Notes (Signed)
Tele called that patient had Ventricular bigeminy, on-call notified, no new order. We continue to monitor.

## 2022-08-01 NOTE — Progress Notes (Signed)
Patient discharged, report called and given to Ashely, LPN nurse at [facility]. No additional questions and or concerns at this time. Call back number given. Patient IV removed per protocol. Patient packet arranged and will be sent with PTAR to give to facility. Awaiting PTAR arrival for discharge.

## 2022-08-01 NOTE — Progress Notes (Signed)
Occupational Therapy Treatment Patient Details Name: [name] MRN: [id number] DOB: November 23, 1924 Today's Date: 08/01/2022   History of present illness [name] is a 89 y.o. male with history of cerebral infarct and dementia, known chronic left meningioma, admitted for episode of unresponsiveness followed by emesis.   OT comments  Pt actively participated today, HOH but able to follow commands as needed with verbal/physical cueing. Pt assessed for O2 saturation during functional activities, details below in General Comments. Pt overall maintained O2 between 93-95 during functional activities and at rest, with 2-3 instances of low O2 between 85-90, quickly recovering. Pt unable to make it to [facility] for toileting, assisted at EOB standing, standing tolerance 1-2 mins with frequent rest breaks. Pt able to transfer to recliner with mod-max A and increased time, no LOB. Pt would benefit from continued skilled therapy to improve activity tolerance and overall functional strength, DC recommendation still appropriate.    Recommendations for follow up therapy are one component of a multi-disciplinary discharge planning process, led by the attending physician.  Recommendations may be updated based on patient status, additional functional criteria and insurance authorization.    Assistance Recommended at Discharge Frequent or constant Supervision/Assistance  Patient can return home with the following  A lot of help with walking and/or transfers;A lot of help with bathing/dressing/bathroom;Assistance with cooking/housework;Assistance with feeding;Direct supervision/assist for medications management;Assist for transportation;Help with stairs or ramp for entrance   Equipment Recommendations  Other (comment) (defer to next venue, if going home, tub bench)    Recommendations for Other Services      Precautions / Restrictions Precautions Precautions: Fall Restrictions Weight Bearing Restrictions: No        Mobility Bed Mobility Overal bed mobility: Needs Assistance Bed Mobility: Supine to Sit     Supine to sit: Max assist, HOB elevated     General bed mobility comments: assistance to move BLE to EOB, cuing to hold to bed rail & participate in coming to sitting EOB    Transfers Overall transfer level: Needs assistance Equipment used: Rolling walker (2 wheels) Transfers: Sit to/from Stand, Bed to chair/wheelchair/BSC Sit to Stand: Max assist, +2 safety/equipment     Step pivot transfers: Max assist, +2 physical assistance, +2 safety/equipment     General transfer comment: Pt has posterior lean with standing, leans against bed and pulls on RW for support, instructed to push up from bed for support. Pt able to step pivt transfer with increased time, min-mod A for support and balance, did not stumble and was able to take small steps to complete transfer.     Balance Overall balance assessment: Needs assistance Sitting-balance support: No upper extremity supported, Feet supported Sitting balance-Leahy Scale: Poor Sitting balance - Comments: able to sit on EOB without UE support, posterior lean and poor overall balance Postural control: Posterior lean Standing balance support: During functional activity, Reliant on assistive device for balance Standing balance-Leahy Scale: Poor Standing balance comment: able to stand with posterior lean, leaning against bed with RW, was able to complete step pivot trasnfer                           ADL either performed or assessed with clinical judgement   ADL Overall ADL's : Needs assistance/impaired     Grooming: Moderate assistance;Sitting                   Toilet Transfer: Maximal assistance;+2 for safety/equipment;Stand-pivot;BSC/3in1;Rolling walker (2 wheels)  Toileting- Clothing Manipulation and Hygiene: Maximal assistance;+2 for safety/equipment;Sit to/from stand       Functional mobility during ADLs:  Moderate assistance;+2 for safety/equipment;Rolling walker (2 wheels) General ADL Comments: Pt attempted to transfer to [facility], wasn't able to hold it, perineal hygiene completed at bedside standing, Pt able to stand for 1 minute at a time requirining rest breaks in between.    Extremity/Trunk Assessment              Vision       Perception     Praxis      Cognition Arousal/Alertness: Awake/alert Behavior During Therapy: WFL for tasks assessed/performed Overall Cognitive Status: Difficult to assess                                 General Comments: Pt with hx of dementia, HOH also impacts communication & cognitive assessment. Pt requires extra time & multimodal cuing to follow simple commands throughout session. Decreased awareness of safety with mobility.        Exercises      Shoulder Instructions       General Comments Pt O2 measured during functional activities. In bed, Pt between 93-95, during bed mobility fluctuated from 89-93. During first sit to stand, dropped to 85, took a minute to recover back to 95. Pt attempted 4-5 more sit to stands during toileitng, consistently stayed above 92-95 during transfer to recliner. Pt began to speak and dropped back down to 88, but immediately recovered back to 94.    Pertinent Vitals/ Pain       Pain Assessment Pain Assessment: No/denies pain  Home Living                                          Prior Functioning/Environment              Frequency  Min 2X/week        Progress Toward Goals  OT Goals(current goals can now be found in the care plan section)  Progress towards OT goals: Progressing toward goals  Acute Rehab OT Goals Patient Stated Goal: to improve strength and return home, open to rehab and post acute care OT Goal Formulation: With patient/family Time For Goal Achievement: 08/13/22 Potential to Achieve Goals: Good ADL Goals Pt Will Perform Upper Body Dressing: with  min assist Pt Will Perform Lower Body Dressing: with mod assist;sitting/lateral leans Pt Will Transfer to Toilet: with min assist Pt Will Perform Toileting - Clothing Manipulation and hygiene: with min assist  Plan Discharge plan remains appropriate    Co-evaluation                 AM-PAC OT "6 Clicks" Daily Activity     Outcome Measure   Help from another person eating meals?: A Lot Help from another person taking care of personal grooming?: A Lot Help from another person toileting, which includes using toliet, bedpan, or urinal?: A Lot Help from another person bathing (including washing, rinsing, drying)?: A Lot Help from another person to put on and taking off regular upper body clothing?: A Lot Help from another person to put on and taking off regular lower body clothing?: Total 6 Click Score: 11    End of Session Equipment Utilized During Treatment: Gait belt;Rolling walker (2 wheels)  OT Visit Diagnosis: Unsteadiness on  feet (R26.81);Other abnormalities of gait and mobility (R26.89);Muscle weakness (generalized) (M62.81)   Activity Tolerance Patient tolerated treatment well   Patient Left in chair;with call bell/phone within reach;with chair alarm set   Nurse Communication Mobility status        Time: 1610-9604 OT Time Calculation (min): 28 min  Charges: OT General Charges $OT Visit: 1 Visit OT Treatments $Self Care/Home Management : 23-37 mins  Assumption, OTR/L   Alexis Goodell 08/01/2022, 3:37 PM

## 2022-08-01 NOTE — TOC Transition Note (Signed)
Transition of Care [facility]) - CM/SW Discharge Note   Patient Details  Name: [name] MRN: [id number] Date of Birth: 1924/07/01  Transition of Care [facility]) CM/SW Contact:  Lorri Frederick, LCSW Phone Number: 08/01/2022, 3:45 PM   Clinical Narrative:   Pt discharging to [facility], room 101.  RN call report to [phone].     Final next level of care: Skilled Nursing Facility Barriers to Discharge: Barriers Resolved   Patient Goals and CMS Choice   Choice offered to / list presented to :  (granddaughter)  Discharge Placement                Patient chooses bed at: [facility] Patient to be transferred to facility by: PTAR Name of family member notified: daughter [name] Patient and family notified of of transfer: 08/01/22  Discharge Plan and Services Additional resources added to the After Visit Summary for   In-house Referral: Clinical Social Work   Post Acute Care Choice: Skilled Nursing Facility                               Social Determinants of Health (SDOH) Interventions SDOH Screenings   Food Insecurity: Food Insecurity Present (05/30/2022)  Housing: Low Risk  (05/30/2022)  Transportation Needs: No Transportation Needs (05/30/2022)  Utilities: Not At Risk (05/30/2022)  Alcohol Screen: Low Risk  (05/30/2022)  Depression (PHQ2-9): Low Risk  (05/30/2022)  Financial Resource Strain: Low Risk  (05/30/2022)  Physical Activity: Inactive (05/30/2022)  Social Connections: Socially Isolated (05/30/2022)  Stress: No Stress Concern Present (05/30/2022)  Tobacco Use: Low Risk  (07/29/2022)     Readmission Risk Interventions     No data to display

## 2022-08-02 DIAGNOSIS — I498 Other specified cardiac arrhythmias: Secondary | ICD-10-CM | POA: Diagnosis not present

## 2022-08-02 DIAGNOSIS — I150 Renovascular hypertension: Secondary | ICD-10-CM | POA: Diagnosis not present

## 2022-08-02 DIAGNOSIS — Z8673 Personal history of transient ischemic attack (TIA), and cerebral infarction without residual deficits: Secondary | ICD-10-CM | POA: Diagnosis not present

## 2022-08-02 DIAGNOSIS — I714 Abdominal aortic aneurysm, without rupture, unspecified: Secondary | ICD-10-CM | POA: Diagnosis not present

## 2022-08-02 DIAGNOSIS — J9601 Acute respiratory failure with hypoxia: Secondary | ICD-10-CM | POA: Diagnosis not present

## 2022-08-02 DIAGNOSIS — G939 Disorder of brain, unspecified: Secondary | ICD-10-CM | POA: Diagnosis not present

## 2022-08-02 DIAGNOSIS — N183 Chronic kidney disease, stage 3 unspecified: Secondary | ICD-10-CM | POA: Diagnosis not present

## 2022-08-02 DIAGNOSIS — G40909 Epilepsy, unspecified, not intractable, without status epilepticus: Secondary | ICD-10-CM | POA: Diagnosis not present

## 2022-08-02 DIAGNOSIS — D329 Benign neoplasm of meninges, unspecified: Secondary | ICD-10-CM | POA: Diagnosis not present

## 2022-08-02 NOTE — Plan of Care (Signed)

## 2022-08-09 ENCOUNTER — Telehealth: Payer: Self-pay

## 2022-08-09 DIAGNOSIS — M6281 Muscle weakness (generalized): Secondary | ICD-10-CM | POA: Diagnosis not present

## 2022-08-09 DIAGNOSIS — R41841 Cognitive communication deficit: Secondary | ICD-10-CM | POA: Diagnosis not present

## 2022-08-09 DIAGNOSIS — Z8673 Personal history of transient ischemic attack (TIA), and cerebral infarction without residual deficits: Secondary | ICD-10-CM | POA: Diagnosis not present

## 2022-08-09 DIAGNOSIS — R2681 Unsteadiness on feet: Secondary | ICD-10-CM | POA: Diagnosis not present

## 2022-08-09 DIAGNOSIS — R1312 Dysphagia, oropharyngeal phase: Secondary | ICD-10-CM | POA: Diagnosis not present

## 2022-08-09 NOTE — Telephone Encounter (Signed)
Patients daughter called she stated the patient is currently at [facility] she is requesting a call back @ [phone]

## 2022-08-10 ENCOUNTER — Encounter: Payer: Medicare HMO | Admitting: Internal Medicine

## 2022-08-16 DIAGNOSIS — M6281 Muscle weakness (generalized): Secondary | ICD-10-CM | POA: Diagnosis not present

## 2022-08-16 DIAGNOSIS — R41841 Cognitive communication deficit: Secondary | ICD-10-CM | POA: Diagnosis not present

## 2022-08-16 DIAGNOSIS — R1312 Dysphagia, oropharyngeal phase: Secondary | ICD-10-CM | POA: Diagnosis not present

## 2022-08-16 DIAGNOSIS — R2681 Unsteadiness on feet: Secondary | ICD-10-CM | POA: Diagnosis not present

## 2022-08-16 DIAGNOSIS — Z8673 Personal history of transient ischemic attack (TIA), and cerebral infarction without residual deficits: Secondary | ICD-10-CM | POA: Diagnosis not present

## 2022-08-18 DIAGNOSIS — D329 Benign neoplasm of meninges, unspecified: Secondary | ICD-10-CM | POA: Diagnosis not present

## 2022-08-18 DIAGNOSIS — M6258 Muscle wasting and atrophy, not elsewhere classified, other site: Secondary | ICD-10-CM | POA: Diagnosis not present

## 2022-08-18 DIAGNOSIS — Z7189 Other specified counseling: Secondary | ICD-10-CM | POA: Diagnosis not present

## 2022-08-18 DIAGNOSIS — R2689 Other abnormalities of gait and mobility: Secondary | ICD-10-CM | POA: Diagnosis not present

## 2022-08-18 DIAGNOSIS — G40909 Epilepsy, unspecified, not intractable, without status epilepticus: Secondary | ICD-10-CM | POA: Diagnosis not present

## 2022-08-18 DIAGNOSIS — R1311 Dysphagia, oral phase: Secondary | ICD-10-CM | POA: Diagnosis not present

## 2022-08-18 DIAGNOSIS — R41841 Cognitive communication deficit: Secondary | ICD-10-CM | POA: Diagnosis not present

## 2022-08-18 DIAGNOSIS — R634 Abnormal weight loss: Secondary | ICD-10-CM | POA: Diagnosis not present

## 2022-08-18 DIAGNOSIS — Z66 Do not resuscitate: Secondary | ICD-10-CM | POA: Diagnosis not present

## 2022-08-18 DIAGNOSIS — Z862 Personal history of diseases of the blood and blood-forming organs and certain disorders involving the immune mechanism: Secondary | ICD-10-CM | POA: Diagnosis not present

## 2022-08-18 DIAGNOSIS — R1312 Dysphagia, oropharyngeal phase: Secondary | ICD-10-CM | POA: Diagnosis not present

## 2022-08-18 DIAGNOSIS — J9601 Acute respiratory failure with hypoxia: Secondary | ICD-10-CM | POA: Diagnosis not present

## 2022-08-18 DIAGNOSIS — N179 Acute kidney failure, unspecified: Secondary | ICD-10-CM | POA: Diagnosis not present

## 2022-08-18 DIAGNOSIS — J189 Pneumonia, unspecified organism: Secondary | ICD-10-CM | POA: Diagnosis not present

## 2022-08-18 DIAGNOSIS — M6281 Muscle weakness (generalized): Secondary | ICD-10-CM | POA: Diagnosis not present

## 2022-08-18 DIAGNOSIS — R2681 Unsteadiness on feet: Secondary | ICD-10-CM | POA: Diagnosis not present

## 2022-08-18 DIAGNOSIS — R55 Syncope and collapse: Secondary | ICD-10-CM | POA: Diagnosis not present

## 2022-08-18 DIAGNOSIS — I10 Essential (primary) hypertension: Secondary | ICD-10-CM | POA: Diagnosis not present

## 2022-08-18 NOTE — Telephone Encounter (Signed)
Call to patient's granddaughter. Her cal was to inform the Clinics that patient is now at [facility] after discharge from the hospital.  Had to cancel an appointment that he had here with Dr. Mikey Bussing.  Patient is currently doing Rehab and PT there and doing okay per granddaughter.  Wanted to see if patient will need to reschedule the appointment to come in to see Dr. Mikey Bussing and when should she try and bring him in.

## 2022-08-18 NOTE — Telephone Encounter (Signed)
I am sure Dr. Mikey Bussing would welcome the chance to see the patient again. It has been about 3 months since his last visit with [place]. Can we try to schedule him with [place] sometime in the next 1-2 months?

## 2022-08-18 NOTE — Telephone Encounter (Signed)
I am just seeing this now while covering for [place]. Did this family get a call back last week? Is there anything still we need to address?

## 2022-08-20 DIAGNOSIS — R2681 Unsteadiness on feet: Secondary | ICD-10-CM | POA: Diagnosis not present

## 2022-08-20 DIAGNOSIS — N179 Acute kidney failure, unspecified: Secondary | ICD-10-CM | POA: Diagnosis not present

## 2022-08-20 DIAGNOSIS — R2689 Other abnormalities of gait and mobility: Secondary | ICD-10-CM | POA: Diagnosis not present

## 2022-08-20 DIAGNOSIS — R1311 Dysphagia, oral phase: Secondary | ICD-10-CM | POA: Diagnosis not present

## 2022-08-20 DIAGNOSIS — R41841 Cognitive communication deficit: Secondary | ICD-10-CM | POA: Diagnosis not present

## 2022-08-20 DIAGNOSIS — R1312 Dysphagia, oropharyngeal phase: Secondary | ICD-10-CM | POA: Diagnosis not present

## 2022-08-20 DIAGNOSIS — J189 Pneumonia, unspecified organism: Secondary | ICD-10-CM | POA: Diagnosis not present

## 2022-08-20 DIAGNOSIS — M6281 Muscle weakness (generalized): Secondary | ICD-10-CM | POA: Diagnosis not present

## 2022-08-20 DIAGNOSIS — M6258 Muscle wasting and atrophy, not elsewhere classified, other site: Secondary | ICD-10-CM | POA: Diagnosis not present

## 2022-08-21 DIAGNOSIS — R2689 Other abnormalities of gait and mobility: Secondary | ICD-10-CM | POA: Diagnosis not present

## 2022-08-21 DIAGNOSIS — R1312 Dysphagia, oropharyngeal phase: Secondary | ICD-10-CM | POA: Diagnosis not present

## 2022-08-21 DIAGNOSIS — M6258 Muscle wasting and atrophy, not elsewhere classified, other site: Secondary | ICD-10-CM | POA: Diagnosis not present

## 2022-08-21 DIAGNOSIS — R1311 Dysphagia, oral phase: Secondary | ICD-10-CM | POA: Diagnosis not present

## 2022-08-21 DIAGNOSIS — M6281 Muscle weakness (generalized): Secondary | ICD-10-CM | POA: Diagnosis not present

## 2022-08-21 DIAGNOSIS — R41841 Cognitive communication deficit: Secondary | ICD-10-CM | POA: Diagnosis not present

## 2022-08-21 DIAGNOSIS — J189 Pneumonia, unspecified organism: Secondary | ICD-10-CM | POA: Diagnosis not present

## 2022-08-21 DIAGNOSIS — R2681 Unsteadiness on feet: Secondary | ICD-10-CM | POA: Diagnosis not present

## 2022-08-21 DIAGNOSIS — N179 Acute kidney failure, unspecified: Secondary | ICD-10-CM | POA: Diagnosis not present

## 2022-08-21 DIAGNOSIS — Z8673 Personal history of transient ischemic attack (TIA), and cerebral infarction without residual deficits: Secondary | ICD-10-CM | POA: Diagnosis not present

## 2022-08-22 DIAGNOSIS — M6258 Muscle wasting and atrophy, not elsewhere classified, other site: Secondary | ICD-10-CM | POA: Diagnosis not present

## 2022-08-22 DIAGNOSIS — R41841 Cognitive communication deficit: Secondary | ICD-10-CM | POA: Diagnosis not present

## 2022-08-22 DIAGNOSIS — N179 Acute kidney failure, unspecified: Secondary | ICD-10-CM | POA: Diagnosis not present

## 2022-08-22 DIAGNOSIS — R1311 Dysphagia, oral phase: Secondary | ICD-10-CM | POA: Diagnosis not present

## 2022-08-22 DIAGNOSIS — R1312 Dysphagia, oropharyngeal phase: Secondary | ICD-10-CM | POA: Diagnosis not present

## 2022-08-22 DIAGNOSIS — R2681 Unsteadiness on feet: Secondary | ICD-10-CM | POA: Diagnosis not present

## 2022-08-22 DIAGNOSIS — J189 Pneumonia, unspecified organism: Secondary | ICD-10-CM | POA: Diagnosis not present

## 2022-08-22 DIAGNOSIS — M6281 Muscle weakness (generalized): Secondary | ICD-10-CM | POA: Diagnosis not present

## 2022-08-22 DIAGNOSIS — R2689 Other abnormalities of gait and mobility: Secondary | ICD-10-CM | POA: Diagnosis not present

## 2022-08-23 DIAGNOSIS — M6281 Muscle weakness (generalized): Secondary | ICD-10-CM | POA: Diagnosis not present

## 2022-08-23 DIAGNOSIS — Z8673 Personal history of transient ischemic attack (TIA), and cerebral infarction without residual deficits: Secondary | ICD-10-CM | POA: Diagnosis not present

## 2022-08-23 DIAGNOSIS — N179 Acute kidney failure, unspecified: Secondary | ICD-10-CM | POA: Diagnosis not present

## 2022-08-23 DIAGNOSIS — R2681 Unsteadiness on feet: Secondary | ICD-10-CM | POA: Diagnosis not present

## 2022-08-23 DIAGNOSIS — M6258 Muscle wasting and atrophy, not elsewhere classified, other site: Secondary | ICD-10-CM | POA: Diagnosis not present

## 2022-08-23 DIAGNOSIS — R41841 Cognitive communication deficit: Secondary | ICD-10-CM | POA: Diagnosis not present

## 2022-08-23 DIAGNOSIS — R1311 Dysphagia, oral phase: Secondary | ICD-10-CM | POA: Diagnosis not present

## 2022-08-23 DIAGNOSIS — J189 Pneumonia, unspecified organism: Secondary | ICD-10-CM | POA: Diagnosis not present

## 2022-08-23 DIAGNOSIS — R2689 Other abnormalities of gait and mobility: Secondary | ICD-10-CM | POA: Diagnosis not present

## 2022-08-23 DIAGNOSIS — R1312 Dysphagia, oropharyngeal phase: Secondary | ICD-10-CM | POA: Diagnosis not present

## 2022-08-28 DIAGNOSIS — I679 Cerebrovascular disease, unspecified: Secondary | ICD-10-CM | POA: Diagnosis not present

## 2022-08-28 DIAGNOSIS — R634 Abnormal weight loss: Secondary | ICD-10-CM | POA: Diagnosis not present

## 2022-08-28 DIAGNOSIS — N183 Chronic kidney disease, stage 3 unspecified: Secondary | ICD-10-CM | POA: Diagnosis not present

## 2022-08-28 DIAGNOSIS — Z6828 Body mass index (BMI) 28.0-28.9, adult: Secondary | ICD-10-CM | POA: Diagnosis not present

## 2022-08-28 DIAGNOSIS — G40909 Epilepsy, unspecified, not intractable, without status epilepticus: Secondary | ICD-10-CM | POA: Diagnosis not present

## 2022-08-28 DIAGNOSIS — I130 Hypertensive heart and chronic kidney disease with heart failure and stage 1 through stage 4 chronic kidney disease, or unspecified chronic kidney disease: Secondary | ICD-10-CM | POA: Diagnosis not present

## 2022-08-28 DIAGNOSIS — I5032 Chronic diastolic (congestive) heart failure: Secondary | ICD-10-CM | POA: Diagnosis not present

## 2022-08-28 DIAGNOSIS — R55 Syncope and collapse: Secondary | ICD-10-CM | POA: Diagnosis not present

## 2022-08-28 DIAGNOSIS — J9601 Acute respiratory failure with hypoxia: Secondary | ICD-10-CM | POA: Diagnosis not present

## 2022-08-28 DIAGNOSIS — D631 Anemia in chronic kidney disease: Secondary | ICD-10-CM | POA: Diagnosis not present

## 2022-08-28 DIAGNOSIS — Z7982 Long term (current) use of aspirin: Secondary | ICD-10-CM | POA: Diagnosis not present

## 2022-08-28 DIAGNOSIS — Z1331 Encounter for screening for depression: Secondary | ICD-10-CM | POA: Diagnosis not present

## 2022-08-29 DIAGNOSIS — H25812 Combined forms of age-related cataract, left eye: Secondary | ICD-10-CM | POA: Diagnosis not present

## 2022-09-04 DIAGNOSIS — I10 Essential (primary) hypertension: Secondary | ICD-10-CM | POA: Diagnosis not present

## 2022-09-05 DIAGNOSIS — I498 Other specified cardiac arrhythmias: Secondary | ICD-10-CM | POA: Diagnosis not present

## 2022-09-05 DIAGNOSIS — H6123 Impacted cerumen, bilateral: Secondary | ICD-10-CM | POA: Diagnosis not present

## 2022-09-05 DIAGNOSIS — I130 Hypertensive heart and chronic kidney disease with heart failure and stage 1 through stage 4 chronic kidney disease, or unspecified chronic kidney disease: Secondary | ICD-10-CM | POA: Diagnosis not present

## 2022-09-05 DIAGNOSIS — F0280 Dementia in other diseases classified elsewhere without behavioral disturbance: Secondary | ICD-10-CM | POA: Diagnosis not present

## 2022-09-05 DIAGNOSIS — G40909 Epilepsy, unspecified, not intractable, without status epilepticus: Secondary | ICD-10-CM | POA: Diagnosis not present

## 2022-09-05 DIAGNOSIS — N183 Chronic kidney disease, stage 3 unspecified: Secondary | ICD-10-CM | POA: Diagnosis not present

## 2022-09-05 DIAGNOSIS — I5032 Chronic diastolic (congestive) heart failure: Secondary | ICD-10-CM | POA: Diagnosis not present

## 2022-09-12 DIAGNOSIS — I5032 Chronic diastolic (congestive) heart failure: Secondary | ICD-10-CM | POA: Diagnosis not present

## 2022-09-12 DIAGNOSIS — I130 Hypertensive heart and chronic kidney disease with heart failure and stage 1 through stage 4 chronic kidney disease, or unspecified chronic kidney disease: Secondary | ICD-10-CM | POA: Diagnosis not present

## 2022-09-12 DIAGNOSIS — N183 Chronic kidney disease, stage 3 unspecified: Secondary | ICD-10-CM | POA: Diagnosis not present

## 2022-09-13 DIAGNOSIS — L603 Nail dystrophy: Secondary | ICD-10-CM | POA: Diagnosis not present

## 2022-09-13 DIAGNOSIS — I5032 Chronic diastolic (congestive) heart failure: Secondary | ICD-10-CM | POA: Diagnosis not present

## 2022-09-13 DIAGNOSIS — H6123 Impacted cerumen, bilateral: Secondary | ICD-10-CM | POA: Diagnosis not present

## 2022-09-13 DIAGNOSIS — I739 Peripheral vascular disease, unspecified: Secondary | ICD-10-CM | POA: Diagnosis not present

## 2022-09-18 DIAGNOSIS — I5032 Chronic diastolic (congestive) heart failure: Secondary | ICD-10-CM | POA: Diagnosis not present

## 2022-09-18 DIAGNOSIS — I69351 Hemiplegia and hemiparesis following cerebral infarction affecting right dominant side: Secondary | ICD-10-CM | POA: Diagnosis not present

## 2022-09-18 DIAGNOSIS — R55 Syncope and collapse: Secondary | ICD-10-CM | POA: Diagnosis not present

## 2022-09-18 DIAGNOSIS — Z7982 Long term (current) use of aspirin: Secondary | ICD-10-CM | POA: Diagnosis not present

## 2022-09-18 DIAGNOSIS — N183 Chronic kidney disease, stage 3 unspecified: Secondary | ICD-10-CM | POA: Diagnosis not present

## 2022-09-21 DIAGNOSIS — I69351 Hemiplegia and hemiparesis following cerebral infarction affecting right dominant side: Secondary | ICD-10-CM | POA: Diagnosis not present

## 2022-09-21 DIAGNOSIS — N183 Chronic kidney disease, stage 3 unspecified: Secondary | ICD-10-CM | POA: Diagnosis not present

## 2022-09-21 DIAGNOSIS — H6123 Impacted cerumen, bilateral: Secondary | ICD-10-CM | POA: Diagnosis not present

## 2022-09-21 DIAGNOSIS — Z7982 Long term (current) use of aspirin: Secondary | ICD-10-CM | POA: Diagnosis not present

## 2022-09-21 DIAGNOSIS — D329 Benign neoplasm of meninges, unspecified: Secondary | ICD-10-CM | POA: Diagnosis not present

## 2022-09-21 DIAGNOSIS — I129 Hypertensive chronic kidney disease with stage 1 through stage 4 chronic kidney disease, or unspecified chronic kidney disease: Secondary | ICD-10-CM | POA: Diagnosis not present

## 2022-09-21 DIAGNOSIS — I69398 Other sequelae of cerebral infarction: Secondary | ICD-10-CM | POA: Diagnosis not present

## 2022-09-21 DIAGNOSIS — G40909 Epilepsy, unspecified, not intractable, without status epilepticus: Secondary | ICD-10-CM | POA: Diagnosis not present

## 2022-10-04 DIAGNOSIS — Z7982 Long term (current) use of aspirin: Secondary | ICD-10-CM | POA: Diagnosis not present

## 2022-10-04 DIAGNOSIS — R600 Localized edema: Secondary | ICD-10-CM | POA: Diagnosis not present

## 2022-10-04 DIAGNOSIS — I5032 Chronic diastolic (congestive) heart failure: Secondary | ICD-10-CM | POA: Diagnosis not present

## 2022-10-04 DIAGNOSIS — F0390 Unspecified dementia without behavioral disturbance: Secondary | ICD-10-CM | POA: Diagnosis not present

## 2022-10-04 DIAGNOSIS — I69351 Hemiplegia and hemiparesis following cerebral infarction affecting right dominant side: Secondary | ICD-10-CM | POA: Diagnosis not present

## 2022-10-05 DIAGNOSIS — R1312 Dysphagia, oropharyngeal phase: Secondary | ICD-10-CM | POA: Diagnosis not present

## 2022-10-05 DIAGNOSIS — R41841 Cognitive communication deficit: Secondary | ICD-10-CM | POA: Diagnosis not present

## 2022-10-05 DIAGNOSIS — N179 Acute kidney failure, unspecified: Secondary | ICD-10-CM | POA: Diagnosis not present

## 2022-10-06 DIAGNOSIS — R41841 Cognitive communication deficit: Secondary | ICD-10-CM | POA: Diagnosis not present

## 2022-10-06 DIAGNOSIS — N179 Acute kidney failure, unspecified: Secondary | ICD-10-CM | POA: Diagnosis not present

## 2022-10-06 DIAGNOSIS — R1312 Dysphagia, oropharyngeal phase: Secondary | ICD-10-CM | POA: Diagnosis not present

## 2022-10-09 DIAGNOSIS — R1312 Dysphagia, oropharyngeal phase: Secondary | ICD-10-CM | POA: Diagnosis not present

## 2022-10-09 DIAGNOSIS — R41841 Cognitive communication deficit: Secondary | ICD-10-CM | POA: Diagnosis not present

## 2022-10-09 DIAGNOSIS — N179 Acute kidney failure, unspecified: Secondary | ICD-10-CM | POA: Diagnosis not present

## 2022-10-11 DIAGNOSIS — R1312 Dysphagia, oropharyngeal phase: Secondary | ICD-10-CM | POA: Diagnosis not present

## 2022-10-11 DIAGNOSIS — R41841 Cognitive communication deficit: Secondary | ICD-10-CM | POA: Diagnosis not present

## 2022-10-11 DIAGNOSIS — N179 Acute kidney failure, unspecified: Secondary | ICD-10-CM | POA: Diagnosis not present

## 2022-10-12 DIAGNOSIS — R1312 Dysphagia, oropharyngeal phase: Secondary | ICD-10-CM | POA: Diagnosis not present

## 2022-10-12 DIAGNOSIS — R41841 Cognitive communication deficit: Secondary | ICD-10-CM | POA: Diagnosis not present

## 2022-10-12 DIAGNOSIS — N179 Acute kidney failure, unspecified: Secondary | ICD-10-CM | POA: Diagnosis not present

## 2022-10-17 DIAGNOSIS — R1312 Dysphagia, oropharyngeal phase: Secondary | ICD-10-CM | POA: Diagnosis not present

## 2022-10-17 DIAGNOSIS — R41841 Cognitive communication deficit: Secondary | ICD-10-CM | POA: Diagnosis not present

## 2022-10-17 DIAGNOSIS — N179 Acute kidney failure, unspecified: Secondary | ICD-10-CM | POA: Diagnosis not present

## 2022-10-18 DIAGNOSIS — N179 Acute kidney failure, unspecified: Secondary | ICD-10-CM | POA: Diagnosis not present

## 2022-10-18 DIAGNOSIS — R1312 Dysphagia, oropharyngeal phase: Secondary | ICD-10-CM | POA: Diagnosis not present

## 2022-10-18 DIAGNOSIS — R41841 Cognitive communication deficit: Secondary | ICD-10-CM | POA: Diagnosis not present

## 2022-10-20 DIAGNOSIS — N179 Acute kidney failure, unspecified: Secondary | ICD-10-CM | POA: Diagnosis not present

## 2022-10-20 DIAGNOSIS — R41841 Cognitive communication deficit: Secondary | ICD-10-CM | POA: Diagnosis not present

## 2022-10-20 DIAGNOSIS — R1312 Dysphagia, oropharyngeal phase: Secondary | ICD-10-CM | POA: Diagnosis not present

## 2022-10-24 DIAGNOSIS — N179 Acute kidney failure, unspecified: Secondary | ICD-10-CM | POA: Diagnosis not present

## 2022-10-24 DIAGNOSIS — R1312 Dysphagia, oropharyngeal phase: Secondary | ICD-10-CM | POA: Diagnosis not present

## 2022-10-24 DIAGNOSIS — R41841 Cognitive communication deficit: Secondary | ICD-10-CM | POA: Diagnosis not present

## 2022-10-25 DIAGNOSIS — R1312 Dysphagia, oropharyngeal phase: Secondary | ICD-10-CM | POA: Diagnosis not present

## 2022-10-25 DIAGNOSIS — N179 Acute kidney failure, unspecified: Secondary | ICD-10-CM | POA: Diagnosis not present

## 2022-10-25 DIAGNOSIS — R41841 Cognitive communication deficit: Secondary | ICD-10-CM | POA: Diagnosis not present

## 2022-10-26 DIAGNOSIS — R1312 Dysphagia, oropharyngeal phase: Secondary | ICD-10-CM | POA: Diagnosis not present

## 2022-10-26 DIAGNOSIS — R41841 Cognitive communication deficit: Secondary | ICD-10-CM | POA: Diagnosis not present

## 2022-10-26 DIAGNOSIS — N179 Acute kidney failure, unspecified: Secondary | ICD-10-CM | POA: Diagnosis not present

## 2022-10-27 DIAGNOSIS — R1312 Dysphagia, oropharyngeal phase: Secondary | ICD-10-CM | POA: Diagnosis not present

## 2022-10-27 DIAGNOSIS — N179 Acute kidney failure, unspecified: Secondary | ICD-10-CM | POA: Diagnosis not present

## 2022-10-27 DIAGNOSIS — R41841 Cognitive communication deficit: Secondary | ICD-10-CM | POA: Diagnosis not present

## 2022-10-30 DIAGNOSIS — R1312 Dysphagia, oropharyngeal phase: Secondary | ICD-10-CM | POA: Diagnosis not present

## 2022-10-30 DIAGNOSIS — N179 Acute kidney failure, unspecified: Secondary | ICD-10-CM | POA: Diagnosis not present

## 2022-10-30 DIAGNOSIS — R41841 Cognitive communication deficit: Secondary | ICD-10-CM | POA: Diagnosis not present

## 2022-10-31 DIAGNOSIS — I69359 Hemiplegia and hemiparesis following cerebral infarction affecting unspecified side: Secondary | ICD-10-CM | POA: Diagnosis not present

## 2022-10-31 DIAGNOSIS — N183 Chronic kidney disease, stage 3 unspecified: Secondary | ICD-10-CM | POA: Diagnosis not present

## 2022-10-31 DIAGNOSIS — J9691 Respiratory failure, unspecified with hypoxia: Secondary | ICD-10-CM | POA: Diagnosis not present

## 2022-10-31 DIAGNOSIS — I509 Heart failure, unspecified: Secondary | ICD-10-CM | POA: Diagnosis not present

## 2022-10-31 DIAGNOSIS — R1312 Dysphagia, oropharyngeal phase: Secondary | ICD-10-CM | POA: Diagnosis not present

## 2022-10-31 DIAGNOSIS — G40909 Epilepsy, unspecified, not intractable, without status epilepticus: Secondary | ICD-10-CM | POA: Diagnosis not present

## 2022-10-31 DIAGNOSIS — N179 Acute kidney failure, unspecified: Secondary | ICD-10-CM | POA: Diagnosis not present

## 2022-10-31 DIAGNOSIS — I130 Hypertensive heart and chronic kidney disease with heart failure and stage 1 through stage 4 chronic kidney disease, or unspecified chronic kidney disease: Secondary | ICD-10-CM | POA: Diagnosis not present

## 2022-10-31 DIAGNOSIS — R41841 Cognitive communication deficit: Secondary | ICD-10-CM | POA: Diagnosis not present

## 2022-10-31 DIAGNOSIS — F0280 Dementia in other diseases classified elsewhere without behavioral disturbance: Secondary | ICD-10-CM | POA: Diagnosis not present

## 2022-11-01 DIAGNOSIS — N179 Acute kidney failure, unspecified: Secondary | ICD-10-CM | POA: Diagnosis not present

## 2022-11-01 DIAGNOSIS — R41841 Cognitive communication deficit: Secondary | ICD-10-CM | POA: Diagnosis not present

## 2022-11-01 DIAGNOSIS — R1312 Dysphagia, oropharyngeal phase: Secondary | ICD-10-CM | POA: Diagnosis not present

## 2022-11-02 DIAGNOSIS — R41841 Cognitive communication deficit: Secondary | ICD-10-CM | POA: Diagnosis not present

## 2022-11-02 DIAGNOSIS — N179 Acute kidney failure, unspecified: Secondary | ICD-10-CM | POA: Diagnosis not present

## 2022-11-02 DIAGNOSIS — R1312 Dysphagia, oropharyngeal phase: Secondary | ICD-10-CM | POA: Diagnosis not present

## 2022-11-03 DIAGNOSIS — N179 Acute kidney failure, unspecified: Secondary | ICD-10-CM | POA: Diagnosis not present

## 2022-11-03 DIAGNOSIS — R41841 Cognitive communication deficit: Secondary | ICD-10-CM | POA: Diagnosis not present

## 2022-11-03 DIAGNOSIS — R1312 Dysphagia, oropharyngeal phase: Secondary | ICD-10-CM | POA: Diagnosis not present

## 2022-11-06 DIAGNOSIS — N179 Acute kidney failure, unspecified: Secondary | ICD-10-CM | POA: Diagnosis not present

## 2022-11-06 DIAGNOSIS — R1312 Dysphagia, oropharyngeal phase: Secondary | ICD-10-CM | POA: Diagnosis not present

## 2022-11-06 DIAGNOSIS — R41841 Cognitive communication deficit: Secondary | ICD-10-CM | POA: Diagnosis not present

## 2022-11-08 DIAGNOSIS — R41841 Cognitive communication deficit: Secondary | ICD-10-CM | POA: Diagnosis not present

## 2022-11-08 DIAGNOSIS — R1312 Dysphagia, oropharyngeal phase: Secondary | ICD-10-CM | POA: Diagnosis not present

## 2022-11-08 DIAGNOSIS — N179 Acute kidney failure, unspecified: Secondary | ICD-10-CM | POA: Diagnosis not present

## 2022-11-09 DIAGNOSIS — N179 Acute kidney failure, unspecified: Secondary | ICD-10-CM | POA: Diagnosis not present

## 2022-11-09 DIAGNOSIS — R1312 Dysphagia, oropharyngeal phase: Secondary | ICD-10-CM | POA: Diagnosis not present

## 2022-11-09 DIAGNOSIS — R41841 Cognitive communication deficit: Secondary | ICD-10-CM | POA: Diagnosis not present

## 2022-11-10 DIAGNOSIS — R1312 Dysphagia, oropharyngeal phase: Secondary | ICD-10-CM | POA: Diagnosis not present

## 2022-11-10 DIAGNOSIS — R41841 Cognitive communication deficit: Secondary | ICD-10-CM | POA: Diagnosis not present

## 2022-11-10 DIAGNOSIS — N179 Acute kidney failure, unspecified: Secondary | ICD-10-CM | POA: Diagnosis not present

## 2022-11-24 ENCOUNTER — Other Ambulatory Visit: Payer: Self-pay

## 2022-11-24 MED ORDER — AMLODIPINE BESYLATE 5 MG PO TABS: 5.0000 mg | ORAL_TABLET | Freq: Every day | ORAL | 3 refills | Status: DC

## 2023-05-07 DIAGNOSIS — Z7185 Encounter for immunization safety counseling: Secondary | ICD-10-CM | POA: Diagnosis not present

## 2023-05-07 DIAGNOSIS — Z23 Encounter for immunization: Secondary | ICD-10-CM | POA: Diagnosis not present

## 2023-06-20 ENCOUNTER — Encounter

## 2023-06-21 ENCOUNTER — Encounter

## 2024-01-08 ENCOUNTER — Observation Stay (HOSPITAL_COMMUNITY)

## 2024-01-08 ENCOUNTER — Other Ambulatory Visit: Payer: Self-pay

## 2024-01-08 ENCOUNTER — Inpatient Hospital Stay (HOSPITAL_COMMUNITY)
Admission: EM | Admit: 2024-01-08 | Discharge: 2024-01-16 | DRG: 377 | Disposition: A | Discharge status: Skilled Nursing Facility | Source: Skilled Nursing Facility | Attending: Internal Medicine | Admitting: Internal Medicine

## 2024-01-08 ENCOUNTER — Emergency Department (HOSPITAL_COMMUNITY)

## 2024-01-08 DIAGNOSIS — F0390 Unspecified dementia without behavioral disturbance: Secondary | ICD-10-CM | POA: Diagnosis present

## 2024-01-08 DIAGNOSIS — N183 Chronic kidney disease, stage 3 unspecified: Secondary | ICD-10-CM | POA: Diagnosis present

## 2024-01-08 DIAGNOSIS — Z8616 Personal history of COVID-19: Secondary | ICD-10-CM

## 2024-01-08 DIAGNOSIS — K625 Hemorrhage of anus and rectum: Principal | ICD-10-CM | POA: Diagnosis present

## 2024-01-08 DIAGNOSIS — E785 Hyperlipidemia, unspecified: Secondary | ICD-10-CM | POA: Diagnosis present

## 2024-01-08 DIAGNOSIS — Z7982 Long term (current) use of aspirin: Secondary | ICD-10-CM

## 2024-01-08 DIAGNOSIS — T17918A Gastric contents in respiratory tract, part unspecified causing other injury, initial encounter: Secondary | ICD-10-CM | POA: Diagnosis present

## 2024-01-08 DIAGNOSIS — I4891 Unspecified atrial fibrillation: Secondary | ICD-10-CM | POA: Diagnosis present

## 2024-01-08 DIAGNOSIS — I959 Hypotension, unspecified: Secondary | ICD-10-CM | POA: Diagnosis present

## 2024-01-08 DIAGNOSIS — N179 Acute kidney failure, unspecified: Secondary | ICD-10-CM | POA: Diagnosis present

## 2024-01-08 DIAGNOSIS — Z723 Lack of physical exercise: Secondary | ICD-10-CM

## 2024-01-08 DIAGNOSIS — Z66 Do not resuscitate: Secondary | ICD-10-CM | POA: Diagnosis present

## 2024-01-08 DIAGNOSIS — K920 Hematemesis: Secondary | ICD-10-CM | POA: Diagnosis present

## 2024-01-08 DIAGNOSIS — D539 Nutritional anemia, unspecified: Secondary | ICD-10-CM | POA: Diagnosis present

## 2024-01-08 DIAGNOSIS — Z9181 History of falling: Secondary | ICD-10-CM

## 2024-01-08 DIAGNOSIS — Z86718 Personal history of other venous thrombosis and embolism: Secondary | ICD-10-CM

## 2024-01-08 DIAGNOSIS — H9193 Unspecified hearing loss, bilateral: Secondary | ICD-10-CM | POA: Diagnosis present

## 2024-01-08 DIAGNOSIS — Z9079 Acquired absence of other genital organ(s): Secondary | ICD-10-CM

## 2024-01-08 DIAGNOSIS — Z604 Social exclusion and rejection: Secondary | ICD-10-CM | POA: Diagnosis present

## 2024-01-08 DIAGNOSIS — K5731 Diverticulosis of large intestine without perforation or abscess with bleeding: Principal | ICD-10-CM | POA: Diagnosis present

## 2024-01-08 DIAGNOSIS — Z555 Less than a high school diploma: Secondary | ICD-10-CM

## 2024-01-08 DIAGNOSIS — L89152 Pressure ulcer of sacral region, stage 2: Secondary | ICD-10-CM | POA: Diagnosis present

## 2024-01-08 DIAGNOSIS — Z8673 Personal history of transient ischemic attack (TIA), and cerebral infarction without residual deficits: Secondary | ICD-10-CM

## 2024-01-08 DIAGNOSIS — N1832 Chronic kidney disease, stage 3b: Secondary | ICD-10-CM | POA: Diagnosis present

## 2024-01-08 DIAGNOSIS — K922 Gastrointestinal hemorrhage, unspecified: Secondary | ICD-10-CM | POA: Diagnosis present

## 2024-01-08 DIAGNOSIS — J690 Pneumonitis due to inhalation of food and vomit: Secondary | ICD-10-CM | POA: Diagnosis present

## 2024-01-08 DIAGNOSIS — Z833 Family history of diabetes mellitus: Secondary | ICD-10-CM

## 2024-01-08 DIAGNOSIS — I5032 Chronic diastolic (congestive) heart failure: Secondary | ICD-10-CM | POA: Diagnosis present

## 2024-01-08 DIAGNOSIS — W44F9XA Other object of natural or organic material, entering into or through a natural orifice, initial encounter: Secondary | ICD-10-CM | POA: Diagnosis present

## 2024-01-08 DIAGNOSIS — D696 Thrombocytopenia, unspecified: Secondary | ICD-10-CM | POA: Diagnosis present

## 2024-01-08 DIAGNOSIS — N400 Enlarged prostate without lower urinary tract symptoms: Secondary | ICD-10-CM | POA: Diagnosis present

## 2024-01-08 DIAGNOSIS — Z79899 Other long term (current) drug therapy: Secondary | ICD-10-CM

## 2024-01-08 DIAGNOSIS — I130 Hypertensive heart and chronic kidney disease with heart failure and stage 1 through stage 4 chronic kidney disease, or unspecified chronic kidney disease: Secondary | ICD-10-CM | POA: Diagnosis present

## 2024-01-08 DIAGNOSIS — Z8249 Family history of ischemic heart disease and other diseases of the circulatory system: Secondary | ICD-10-CM

## 2024-01-08 DIAGNOSIS — I10 Essential (primary) hypertension: Secondary | ICD-10-CM | POA: Diagnosis present

## 2024-01-08 DIAGNOSIS — Z9981 Dependence on supplemental oxygen: Secondary | ICD-10-CM

## 2024-01-08 LAB — CBC WITH DIFFERENTIAL/PLATELET
Abs Immature Granulocytes: 0.05 K/uL (ref 0.00–0.07)
Basophils Absolute: 0 K/uL (ref 0.0–0.1)
Basophils Relative: 0 %
Eosinophils Absolute: 0.1 K/uL (ref 0.0–0.5)
Eosinophils Relative: 1 %
HCT: 30.9 % — ABNORMAL LOW (ref 39.0–52.0)
Hemoglobin: 9.3 g/dL — ABNORMAL LOW (ref 13.0–17.0)
Immature Granulocytes: 1 %
Lymphocytes Relative: 17 %
Lymphs Abs: 1.8 K/uL (ref 0.7–4.0)
MCH: 30.2 pg (ref 26.0–34.0)
MCHC: 30.1 g/dL (ref 30.0–36.0)
MCV: 100.3 fL — ABNORMAL HIGH (ref 80.0–100.0)
Monocytes Absolute: 0.8 K/uL (ref 0.1–1.0)
Monocytes Relative: 7 %
Neutro Abs: 7.8 K/uL — ABNORMAL HIGH (ref 1.7–7.7)
Neutrophils Relative %: 74 %
Platelets: 149 K/uL — ABNORMAL LOW (ref 150–400)
RBC: 3.08 MIL/uL — ABNORMAL LOW (ref 4.22–5.81)
RDW: 13.8 % (ref 11.5–15.5)
WBC: 10.5 K/uL (ref 4.0–10.5)
nRBC: 0 % (ref 0.0–0.2)

## 2024-01-08 LAB — I-STAT CHEM 8, ED
BUN: 37 mg/dL — ABNORMAL HIGH (ref 8–23)
Calcium, Ion: 1.37 mmol/L (ref 1.15–1.40)
Chloride: 108 mmol/L (ref 98–111)
Creatinine, Ser: 1.6 mg/dL — ABNORMAL HIGH (ref 0.61–1.24)
Glucose, Bld: 124 mg/dL — ABNORMAL HIGH (ref 70–99)
HCT: 29 % — ABNORMAL LOW (ref 39.0–52.0)
Hemoglobin: 9.9 g/dL — ABNORMAL LOW (ref 13.0–17.0)
Potassium: 4.6 mmol/L (ref 3.5–5.1)
Sodium: 143 mmol/L (ref 135–145)
TCO2: 27 mmol/L (ref 22–32)

## 2024-01-08 LAB — COMPREHENSIVE METABOLIC PANEL WITH GFR
ALT: 11 U/L (ref 0–44)
AST: 18 U/L (ref 15–41)
Albumin: 2.7 g/dL — ABNORMAL LOW (ref 3.5–5.0)
Alkaline Phosphatase: 57 U/L (ref 38–126)
Anion gap: 7 (ref 5–15)
BUN: 32 mg/dL — ABNORMAL HIGH (ref 8–23)
CO2: 26 mmol/L (ref 22–32)
Calcium: 9.8 mg/dL (ref 8.9–10.3)
Chloride: 107 mmol/L (ref 98–111)
Creatinine, Ser: 1.57 mg/dL — ABNORMAL HIGH (ref 0.61–1.24)
GFR, Estimated: 40 mL/min — ABNORMAL LOW (ref >60)
Glucose, Bld: 131 mg/dL — ABNORMAL HIGH (ref 70–99)
Potassium: 4.5 mmol/L (ref 3.5–5.1)
Sodium: 140 mmol/L (ref 135–145)
Total Bilirubin: 0.6 mg/dL (ref 0.0–1.2)
Total Protein: 5.7 g/dL — ABNORMAL LOW (ref 6.5–8.1)

## 2024-01-08 LAB — CBC
HCT: 27.5 % — ABNORMAL LOW (ref 39.0–52.0)
Hemoglobin: 8.4 g/dL — ABNORMAL LOW (ref 13.0–17.0)
MCH: 30.1 pg (ref 26.0–34.0)
MCHC: 30.5 g/dL (ref 30.0–36.0)
MCV: 98.6 fL (ref 80.0–100.0)
Platelets: 149 K/uL — ABNORMAL LOW (ref 150–400)
RBC: 2.79 MIL/uL — ABNORMAL LOW (ref 4.22–5.81)
RDW: 13.8 % (ref 11.5–15.5)
WBC: 12.8 K/uL — ABNORMAL HIGH (ref 4.0–10.5)
nRBC: 0 % (ref 0.0–0.2)

## 2024-01-08 MED ORDER — PANTOPRAZOLE SODIUM 40 MG IV SOLR
40.0000 mg | Freq: Two times a day (BID) | INTRAVENOUS | Status: DC
  Administered 2024-01-09 – 2024-01-11 (×5): 40 mg via INTRAVENOUS
  Filled 2024-01-08 (×5): qty 10

## 2024-01-08 MED ORDER — PANTOPRAZOLE SODIUM 40 MG IV SOLR
40.0000 mg | Freq: Once | INTRAVENOUS | Status: DC
  Filled 2024-01-08: qty 10

## 2024-01-08 MED ORDER — LACTATED RINGERS IV SOLN: INTRAVENOUS | Status: DC

## 2024-01-08 MED ORDER — ONDANSETRON HCL 4 MG/2ML IJ SOLN
4.0000 mg | Freq: Four times a day (QID) | INTRAMUSCULAR | Status: DC | PRN
  Administered 2024-01-08: 4 mg via INTRAVENOUS
  Filled 2024-01-08 (×2): qty 2

## 2024-01-08 MED ORDER — PANTOPRAZOLE SODIUM 40 MG IV SOLR
80.0000 mg | Freq: Once | INTRAVENOUS | Status: AC
Start: 2024-01-08 — End: 2024-01-09
  Administered 2024-01-09: 80 mg via INTRAVENOUS
  Filled 2024-01-08: qty 20

## 2024-01-08 NOTE — Hospital Course (Addendum)
 #Melena #Coffee-ground emesis  #Anemia #Hypotension, resolved  #Hypoxemia, improved to baseline  Prior to admission, patient had 3 black tarry BM at [facility] 10/27, though did not have vomiting episodes and was acting like his normal self, eating and drinking appropriately with assistance. He is on chronic oxygen  4L [place] supplementation at the rehab facility, though unclear why. Collateral from granddaughter [name] was that she last understood patient's supplemental oxygen  to be at 2L [place], and didn't know that he had been increased to 4L  at the facility. Hgb on admission of 9.3, Hct of 30.9. CXR on admission showed no acute process. During initial interview and exam of patient, supplemental O2 was turned off and he remained at SpO2 100% on room air for the next 6 hours. Patient did not appear dyspneic or gasping for air. Difficult to assess ROS given patient is baseline oriented to self only, but on exam he did not have tenderness to palpation of his abdomen. ED MD performed DRE which had brown stool, no evidence of blood. Reassuringly, vitals were stable on admission. Discussed with patient's granddaughter [name] who is his primary decision maker, who did not wish for invasive procedures including EGD or colonoscopy unless emergent needs. Her primary goal is his comfort and stabilization. He was initiated on IV Protonix  and IVF. Home meds were held. Overnight of admission, he vomited and aspirated his vomit with coffee ground emesis, as well as large amounts of melanotic stool, with issues of hypotension and hypoxemia thereafter. CXR showed increased small scattered perihilar opacities, left greater than right, which may represent atelectasis or pneumonia and repeat CXR continued to show right apical opacity and bibasilar patchy opacities, possibly related to hypoxia. Hgb decreased from 9.3 to 8.4 in roughly 12 hours. Given his instability, he was given 1 unit PRBC and IVF bolus. AM CBC showed Hgb stable at 9.2,  with no further episodes of hematemesis. He had to be placed on 7L HFNC. He was initiated on a 5-day course of Zosyn  for aspiration pneumonia coverage. He had x2 more episodes of Type 7 red/black stools over the next 24 hours. CBC were trended q8 hrs which showed stable Hgb mid 7's. His BP remained soft, but improving. By 10/31, he had remained afebrile and hemodynamically stable. He did not have any more bloody or melanotic stools for 4 days prior to discharge. He was monitored with continuous cardiac monitoring, q8 CBC, close attention to his vitals, PPI, IVF, and antibiotics.  - repeat CBC to ensure stable and improving Hgb  - Continue Protonix  40 mg PO BID for 8 weeks, followed by Protonix  40 mg once daily indefinitely - Avoid all NSAIDs indefinitely  #Aspiration Pneumonia #Hypoxemia, improving  #Leukocytosis, improving #Aspiration Risk Patient with increased O2 requirement initially after aspiration event after choking on his vomit. He was initiated on IV Unasyn  for aspiration pneumonia coverage. Leukocytosis improved from 19.9 to 10.4 by day of discharge. CXR showed increased patchy RLL opacity c/f pneumonia or aspiration. He was treated with 5 days of IV Unasyn , and given rhonchorous sounding lungs, extended for 10 days total of antibiotic treatment. He received his AM and afternoon Augmentin prior to discharge, and will need to begin his outpatient medications at 2200 on 01/15/2024 to finish out his Augmentin prescription. Patient was evaluated by SLP during admission and he continues to show aspiration risk. With shared decision making with granddaughter [name], recommendation to continue with puree with thin liquid diet. By day of discharge he was oxygenating well on  home chronic 2L [place] at 100% with good forehead pleth.  - Continue Augmentin 500-125 mg q8 hrs for 10 doses beginning at 2200 on 11/4 - Continue with puree with thin liquid diet   #HTN Patient has a history of HTN and per rehab  facility records has been taking Losartan 50 mg BID. Last dose outpatient was given evening before admission. He had low-normal BP initially so this was held. Monitored BP over the course of his admission and once he was medically stable, his BP remained normotensive. Would recommend continuing to monitor BP and may resume at a lowered dose if indicated.   - HOLD home Losartan 50 mg BID  - Would recommend continuing to monitor BP and may resume at a lowered dose if indicated.     #Prior CVA (2013, 2021) Patient has a history of CVA in 2013 and 2021. Unclear if there were any residual deficits. Called [facility] facility though RN who works with patient was not available so spoke with diplomatic services operational officer. He takes Aspirin  81 mg daily and Atorvastatin  10 mg daily, he last took aspirin  10/28 AM and Atorvastatin  on 10/27 PM prior to admission. Given concern for GI bleeding, decreased Hgb and history of diverticulitis, held aspirin  on admission. After GOC discussion with patient's decision maker Granddaughter [name], have decided to discontinue these medications altogether.   - STOP Aspirin  81 mg daily  - STOP Atorvastatin  10 mg daily    #CKD3a Patient has a history of CKD3a, with most recent Cr of ~1.5 - 1.6 in May 2024 and eGFR at that time ~47. This admission with Cr of 1.6, though eGFR of 40 which puts him closer to CKD3b if this is his new normal. He did have an AKI on CKD with peak Cr of 2.01. This was trended with BMP and showed improvement to 1.23 by day of discharge.    #Thrombocytopenia Patient has a history of thrombocytopenia, baseline appears to be 120-150s. Platelets on admission of 149, they improved to 173 by day of discharge. He was monitored daily with CBC.   #Dementia Patient with advanced dementia and oriented to self only at baseline. He was put on delirium precautions during admission.

## 2024-01-08 NOTE — Significant Event (Signed)
 Paged for significant event at 2206.  CODE BLUE called.  Patient required more than 3 persons to evaluate and treat, but patient never required ACLS.  Patient had aspiration event leading patient to becoming apneic and unresponsive.  Patient never truly lost a pulse.  On arrival to the room, patient had been actively suctioned by rapid response RN.  Oxygen  saturations in the 50s.  Nonrebreather placed.  Patient slowly came back to.  Blood pressures stable 108/66 pulse of 88.  Patient did have a large melanotic bowel movement.  Patient also had coffee-ground like emesis.  No frank red blood in emesis.  Patient was able to respond to verbal stimuli and able to follow instructions.  Oxygen  saturation trended up into the 90s on nonrebreather.  Obtained stat CBC showing hemoglobin 8.4, down from 9.9.  Given event, called granddaughter, who is patient's primary decision-maker.  Granddaughter agreed for GI consult to evaluate for potential procedure.  Granddaughter did state that patient is to remain DNR and DNI.  Consulted GI, who will follow along.  Will move patient to progressive. If patient does decompensate further patient will need further intervention tonight. Will start trending H&H. If Hgb less than 7, transfuse  Given that Protonix  40 mg has not been given yet, will load with 80 mg of IV Protonix . Start twice daily 40 mg Protonix  tomorrow.

## 2024-01-08 NOTE — H&P (Cosign Needed Addendum)
 Date: 01/08/2024               Patient Name:  [name] MRN: [id number]  DOB: 12-20-1924 Age / Sex: 89 y.o., male   PCP: Rosan Dayton BROCKS, DO         Medical Service: Internal Medicine Teaching Service         Attending Physician: Dr. Ronnald Sergeant      First Contact: Doyal Miyamoto, MD    Second Contact: Dr. Drue Grow, MD         Pager Information: First Contact Pager: [phone]   Second Contact Pager: [phone]   SUBJECTIVE   Chief Complaint: Bloody BM  History of Present Illness: [name] is a 89 y.o. male with a history of dementia (baseline only oriented to self), diverticulitis CKD3a, HTN, HLD, CVA (2013, 2021), DVT (2015), BPH s/p TURP (2015), thrombocytopenia, who presents from his rehab facility due to black tarry stools since yesterday 10/27.   Sign out from ED, chart review, and call to rehab facility revealed that he has a history of dementia and is normally only oriented to self. When he presented to the ED he was oriented to self. He is on chronic oxygen , 4L [place] at his rehab facility. There is no reported history of hematemesis.   Unable to assess ROS given baseline dementia and only oriented to self.   Collateral obtained from granddaughter, primary decision maker, [name] via telephone: Patient is oriented to self and sometimes DOB at baseline. He has had difficulties with eating sometimes in more recent months and needs assistance with eating. She does note a history of diverticulitis and has had about 2-3 episodes of black tarry stools in the past few years, though they decided to forego further work up as the episodes resolved on their own and were few and far between. Granddaughter does not wish to have invasive procedures such as EGD or colonoscopy if able to avoid. Her goal is to ensure that patient is kept comfortable and stable. She would also like us  to determine if patient needs to be on the medications he's currently on. She last understood patient's  supplemental oxygen  to be at 2L [place], and didn't know that he had been increased to 4L [place] at the facility. She has sat with her grandfather at times and took his [place] off and noted him to be perfectly fine and without dyspnea or difficulty breathing, so she is unsure why he is on the supplemental oxygen  and denies a known history of lung disease.   ED Course: Vitals: Blood pressure 128/87, pulse 86, temperature (!) 97.5 F (36.4 C), temperature source Oral, resp. rate 11, height 5' 8 (1.727 m), weight 85 kg, SpO2 100%.  Labs: Hgb 9.9, Hct 29, Platelets 149, Cr 1.60, BUN 37, eGFR 40, Albumin 2.7 Imaging: CXR showed no acute process and low lung volumes.  Received: LR infusion  Consulted: IMTS  Meds:  Patient reported:   Medications obtained from paperwork sent by facility  Current Meds  Medication Sig   acetaminophen  (TYLENOL ) 325 MG tablet Take 650 mg by mouth every 6 (six) hours as needed for mild pain (pain score 1-3), moderate pain (pain score 4-6) or fever.   aspirin  81 MG EC tablet Take 1 tablet (81 mg total) by mouth daily.   atorvastatin  (LIPITOR) 10 MG tablet Take 1 tablet (10 mg total) by mouth daily.   Emollient (EUCERIN) lotion Apply 1 Application topically as needed for dry skin.  furosemide  (LASIX ) 20 MG tablet Take 20 mg by mouth daily.   losartan (COZAAR) 50 MG tablet Take 50 mg by mouth 2 (two) times daily.   Aspirin  81 mg: last taken 10/28 AM Atorvastatin  10 mg: last taken 10/27 PM Furosemide  40 mg: last taken 10/28 AM Losartan 50 mg BID: last taken 10/27 PM   Past Medical History CKD3a, HTN, HLD, CVA (2013, 2021), DVT (2015), BPH s/p TURP (2015), thrombocytopenia  Past Surgical History Past Surgical History:  Procedure Laterality Date   CYSTOSCOPY N/A 06/13/2013   Procedure: CYSTOSCOPY FLEXIBLE BEDSIDE;  Surgeon: Morene LELON Salines, MD;  Location: [facility] OR;  Service: Urology;  Laterality: N/A;   MYRINGOTOMY WITH TUBE PLACEMENT Bilateral    TRANSURETHRAL RESECTION OF  PROSTATE N/A 05/19/2013   Procedure: TRANSURETHRAL RESECTION OF THE PROSTATE WITH GYRUS INSTRUMENTS;  Surgeon: Arlena LILLETTE Gal, MD;  Location: [facility] ORS;  Service: Urology;  Laterality: N/A;    Social:  Lives With: At [facility]  Occupation: Retired  Support: Grandchildren  Level of Function: PCP:  Rosan Dayton BROCKS, DO  Substances: - Tobacco: None - Alcohol: None - Recreational Drug: None  Family History:  Family History  Problem Relation Age of Onset   Diabetes Mother    Heart disease Mother    Hypertension Mother    Heart disease Father    Hypertension Father    Hypertension Daughter    Thyroid disease Daughter      Allergies: Allergies as of 01/08/2024   (No Known Allergies)    Review of Systems: A complete ROS was negative except as per HPI.   OBJECTIVE:   Physical Exam: Blood pressure 128/87, pulse 86, temperature (!) 97.5 F (36.4 C), temperature source Oral, resp. rate 11, height 5' 8 (1.727 m), weight 85 kg, SpO2 100%.   Constitutional: well-appearing male sitting in hospital bed, in no acute distress.  HEENT: normocephalic atraumatic, mucous membranes moist Eyes: conjunctiva non-erythematous Cardiovascular: regular rate and rhythm, bilateral radial pulses 2+, bilateral dorsal pedal pulses 2+, brisk capillary refill bilateral feet and hands  Pulmonary/Chest: breathing on 2L [place] then turned off to RA with normal work of breathing on room air and O2 sat 100%, lungs clear to auscultation bilaterally Abdominal: soft, non-tender, non-distended MSK: normal bulk and tone. Neurological: alert & oriented x 1 (to self only) at baseline Skin: warm and dry, scaling of skin on bilateral feet Psych: mood calm, behavior normal  Labs: CBC    Component Value Date/Time   WBC 10.5 01/08/2024 1146   RBC 3.08 (L) 01/08/2024 1146   HGB 9.9 (L) 01/08/2024 1151   HGB 11.4 (L) 02/10/2021 1630   HCT 29.0 (L) 01/08/2024 1151   HCT 35.6 (L) 02/10/2021 1630    PLT 149 (L) 01/08/2024 1146   PLT 190 02/10/2021 1630   MCV 100.3 (H) 01/08/2024 1146   MCV 87 02/10/2021 1630   MCH 30.2 01/08/2024 1146   MCHC 30.1 01/08/2024 1146   RDW 13.8 01/08/2024 1146   RDW 13.6 02/10/2021 1630   LYMPHSABS 1.8 01/08/2024 1146   LYMPHSABS 1.3 01/13/2021 1156   MONOABS 0.8 01/08/2024 1146   EOSABS 0.1 01/08/2024 1146   EOSABS 0.2 01/13/2021 1156   BASOSABS 0.0 01/08/2024 1146   BASOSABS 0.0 01/13/2021 1156     CMP     Component Value Date/Time   NA 143 01/08/2024 1151   NA 149 (H) 02/10/2021 1630   K 4.6 01/08/2024 1151   CL 108 01/08/2024 1151  CO2 26 01/08/2024 1146   GLUCOSE 124 (H) 01/08/2024 1151   BUN 37 (H) 01/08/2024 1151   BUN 23 02/10/2021 1630   CREATININE 1.60 (H) 01/08/2024 1151   CREATININE 0.96 01/15/2014 1648   CALCIUM  9.8 01/08/2024 1146   PROT 5.7 (L) 01/08/2024 1146   ALBUMIN 2.7 (L) 01/08/2024 1146   AST 18 01/08/2024 1146   ALT 11 01/08/2024 1146   ALKPHOS 57 01/08/2024 1146   BILITOT 0.6 01/08/2024 1146   GFRNONAA 40 (L) 01/08/2024 1146   GFRNONAA 71 01/15/2014 1648   GFRAA 57 (L) 09/16/2019 1520   GFRAA 82 01/15/2014 1648    Imaging:  DG Chest Port 1 View Result Date: 01/08/2024 EXAM: 1 VIEW(S) XRAY OF THE CHEST 01/08/2024 11:37:00 AM COMPARISON: CTA chest 08/01/2022 chest radiograph 07/29/2022. CLINICAL HISTORY: sob. SOB. Pt BIB [facility] from [facility]. Pt had blood in stool yesterday. Reported it was dark and tarry. Hx of GI bleeds and dementia. FINDINGS: LUNGS AND PLEURA: Low lung volumes. No focal pulmonary opacity. No pulmonary edema. No pleural effusion. No pneumothorax. HEART AND MEDIASTINUM: Aortic atherosclerosis. Tortuosity of the thoracic aorta. No acute abnormality of the cardiac and mediastinal silhouettes. BONES AND SOFT TISSUES: No acute osseous abnormality. IMPRESSION: 1. No acute process. 2. Low lung volumes. Electronically signed by: Donnice Mania MD 01/08/2024 12:25 PM EDT RP Workstation: [id number]      EKG: personally reviewed my interpretation is NSR, LVH, no evidence acute ischemia. Prior EKG grossly unchanged   ASSESSMENT & PLAN:   Assessment & Plan by Problem: Principal Problem:   Upper GI bleed Active Problems:   HTN (hypertension)   History of CVA (cerebrovascular accident)   CKD (chronic kidney disease) stage 3, GFR 30-59 ml/min (HCC)   Hyperlipidemia   Chronic diastolic (congestive) heart failure (HCC)   [name] is a 89 y.o. person living with a history of history of dementia, diverticulitis, CKD3a, HTN, HLD, CVA (2013, 2021), DVT (2015), BPH s/p TURP (2015), thrombocytopenia, who presents from his rehab facility due to black tarry blood in his stool for 1 day and admitted for GI bleed work up on hospital day 0  #Melena #Anemia Per [facility], patient had 3 black tarry BM since 10/27. Hgb on admission of 9.3, Hct of 30.9. He is on chronic oxygen  4L [place] supplementation at the rehab facility, though unclear why. During interview and exam of patient, supplemental O2 was turned off and he remained at SpO2 100% on room air. Patient did not appear dyspneic or gasping for air. Difficult to assess ROS given patient is baseline oriented to self only, but on exam he does not have tenderness to palpation of his abdomen. ED MD performed DRE which had brown stool, no evidence of blood. Reassuringly, vitals have been stable. Discussed with patient's granddaughter Jimmy who is his primary decision maker, who does not wish for invasive procedures including EGD or colonoscopy. Her primary goal is his comfort and stabilization.  - close monitoring of BM - IV Protonix  40 mg  - f/u CBC  #Dementia Patient with dementia and oriented to self only at baseline.  - Delirium precautions   #HTN Patient has a history of HTN and per rehab facility records has been taking Losartan 50 mg BID. Last dose was given yesterday evening. He currently has low-normal BP since admission so will hold for now.  He may not need BP medication upon discharge.  - Hold home Losartan 50 mg BID   #Prior CVA (2013, 2021)  Patient has a history of CVA in 2013 and 2021. Unclear if there were any residual deficits. Called [facility] though RN who works with patient was not available so spoke with diplomatic services operational officer. He takes Aspirin  81 mg daily and Atorvastatin  10 mg daily, he last took aspirin  10/28 AM and Atorvastatin  on 10/27 PM. Given concern for GI bleeding, decreased Hgb and history of diverticulitis, will hold aspirin  for now. Atorvastatin  10 mg is not high intensity, so may consider discontinuing this upon discharge.  - Hold Aspirin  81 mg daily  - Hold Atorvastatin  10 mg daily   #CKD3a vs. CKD3b Patient has a history of CKD3a, with most recent Cr of ~1.5 - 1.6 in 2024 and eGFR at that time ~47. This admission with Cr of 1.6, though eGFR of 40 which puts him closer to CKD3b if this is his new normal. He did receive LR in ED, so will follow up AM labs.  - f/u BMP   #Thrombocytopenia Patient has a history of thrombocytopenia, baseline appears to be 120-150s. Platelets on admission of 149. Will continue to monitor.  - CBC  Best practice: Diet: NPO VTE: SCDs IVF: LR,125cc/hr for 13 hours  Code: DNR Limited  Disposition planning: Prior to Admission Living Arrangement: SNF, [facility] Anticipated Discharge Location: SNF  Dispo: Admit patient to Observation with expected length of stay less than 2 midnights.  Signed: Doyal Miyamoto, MD Internal Medicine Resident  01/08/2024, 4:36 PM  On Call pager: [phone]

## 2024-01-08 NOTE — ED Notes (Signed)
IV attempted x 2 by this RN. 

## 2024-01-08 NOTE — Progress Notes (Signed)
   01/08/24 1015  Spiritual Encounters  Type of Visit Initial  Care provided to: Patient  Referral source Code page  Reason for visit Code  OnCall Visit Yes  Spiritual Framework  Presenting Themes Impactful experiences and emotions  Community/Connection None  Patient Stress Factors Health changes  Family Stress Factors None identified   Chaplain responded to a Medical Alert Pt was undergoing resuscitation on arrival. Team successfully revived Pt and canceled alert.  Chaplain provided supportive presence and remains available for follow up.

## 2024-01-08 NOTE — ED Provider Notes (Signed)
 [place] EMERGENCY DEPARTMENT AT [facility] Provider Note   CSN: [id number] Arrival date & time: 01/08/24  1040     Patient presents with: No chief complaint on file.   [name] is a 89 y.o. male.   89 year old male presents from rehab facility due to blood in stool since yesterday.  He is not on blood thinners at this time.  Has a history of dementia and is normally alert and oriented x 1 which he has right now.  Patient is on oxygen  at baseline at 4 L.  No reported history of hematemesis.  No bright red blood per rectum.  No reported abdominal distention.  EMS was called and CBG was 170 and patient transferred here       Prior to Admission medications   Medication Sig Start Date End Date Taking? Authorizing Provider  amLODipine  (NORVASC ) 5 MG tablet Take 1 tablet (5 mg total) by mouth daily. 11/24/22 11/24/23  Rosan Dayton BROCKS, DO  aspirin  81 MG EC tablet Take 1 tablet (81 mg total) by mouth daily. 07/23/20   Carolynn Standing, MD  atorvastatin  (LIPITOR) 10 MG tablet Take 1 tablet (10 mg total) by mouth daily. 08/02/22   Jolaine Pac, DO    Allergies: Patient has no known allergies.    Review of Systems  Unable to perform ROS: Dementia    Updated Vital Signs There were no vitals taken for this visit.  Physical Exam Vitals and nursing note reviewed. Exam conducted with a chaperone present.  Constitutional:      General: He is not in acute distress.    Appearance: Normal appearance. He is well-developed. He is not toxic-appearing.  HENT:     Head: Normocephalic and atraumatic.  Eyes:     General: Lids are normal.     Conjunctiva/sclera: Conjunctivae normal.     Pupils: Pupils are equal, round, and reactive to light.  Neck:     Thyroid: No thyroid mass.     Trachea: No tracheal deviation.  Cardiovascular:     Rate and Rhythm: Normal rate and regular rhythm.     Heart sounds: Normal heart sounds. No murmur heard.    No gallop.  Pulmonary:      Effort: Pulmonary effort is normal. No respiratory distress.     Breath sounds: Normal breath sounds. No stridor. No decreased breath sounds, wheezing, rhonchi or rales.  Abdominal:     General: There is no distension.     Palpations: Abdomen is soft.     Tenderness: There is no abdominal tenderness. There is no rebound.  Genitourinary:    Comments: Brown stool noted on DRE Musculoskeletal:        General: No tenderness. Normal range of motion.     Cervical back: Normal range of motion and neck supple.  Skin:    General: Skin is warm and dry.     Findings: No abrasion or rash.  Neurological:     Mental Status: He is alert. Mental status is at baseline. He is disoriented.     GCS: GCS eye subscore is 4. GCS verbal subscore is 5. GCS motor subscore is 6.     Cranial Nerves: No cranial nerve deficit.     Sensory: No sensory deficit.     Comments: Bilateral upper extremity contractures noted  Psychiatric:        Attention and Perception: He is inattentive.     (all labs ordered are listed, but only abnormal results are displayed)  Labs Reviewed  CBC WITH DIFFERENTIAL/PLATELET  COMPREHENSIVE METABOLIC PANEL WITH GFR  I-STAT CHEM 8, ED  TYPE AND SCREEN    EKG: None  Radiology: No results found.   Procedures   Medications Ordered in the ED  lactated ringers  infusion (has no administration in time range)                                    Medical Decision Making Amount and/or Complexity of Data Reviewed Labs: ordered. Radiology: ordered.  Risk Prescription drug management.   Patient's labs significant for 5 g drop in hemoglobin.  He has been typed and screened at this point.  Has chronic kidney disease.  No active bleeding appreciated at this time.  Will be admitted to the internal medicine service     Final diagnoses:  None    ED Discharge Orders     None          Dasie Faden, MD 01/08/24 1414

## 2024-01-08 NOTE — ED Triage Notes (Signed)
 Pt BIB [facility] from [facility]. Pt had blood in stool yesterday. Reported it was dark and tarry. Hx of GI bleeds and dementia. A and O x1, bedbound at baseline. 4L o2 baseline.   EMS VS 112/70 80 HR 97 4L 170 CBG

## 2024-01-08 NOTE — ED Notes (Signed)
 Admitting MD team removed O2 [place] and pt spO2 remained stable. O2 off for now w/ spO2 @ 100.

## 2024-01-08 NOTE — Significant Event (Signed)
 Rapid Response Event Note   Reason for Call :  Responded to Code Blue alarm heard on unit during rounds.  Per RN, pt vomited/aspirated and then went unresponsive, Code Blue was initiated.   On my arrival, pt lethargic with strong palpable pulse, HR-50s, SpO2-53% on RA. He has coffee ground emesis on his gown and dark brown stool in the bed.  Pt is a DNR-LIMITED. Pt placed on NRB mask and was NT suctioned with coffee ground emesis suctioned out.   Code Blue was cancelled on arrival of Code Blue team.   After a few minutes, pt SpO2 increased to 96% on NRB, HR to 83, and he was awake and able to follow commands. Lungs rhonchus t/o. ABD large/ soft. Skin cool to touch.   HR-83, BP-108/59, RR-22, SpO2-96% on NRB-weaned to 15L HFNC([place])-95%.  Interventions:  NRB NTS CBC Protonix  80mg  x1, then 40mg  q12h Zofran  4mg  prn NPO PCXR GI consulted Tx to PCU Plan of Care:  Tx to PCU for closer monitoring. Please call RRT if further assistance needeed.  Event Summary:   MD Notified: Drs. Patel and D'Mello notified and came to bedside.  Call Time:2209 Arrival Time:2209 End Upfz:[id number]  Tish Graeme Piety, RN

## 2024-01-08 NOTE — ED Notes (Signed)
 This RN called the floor and informed them that the PT would be coming up. Secretary stated that the RN was in the middle of something but ok, this RN stated to the secretary that the receiving RN had read report and given a green man so PT was on the way up. Secretary stated ok.

## 2024-01-09 ENCOUNTER — Inpatient Hospital Stay (HOSPITAL_COMMUNITY)

## 2024-01-09 ENCOUNTER — Observation Stay (HOSPITAL_COMMUNITY)

## 2024-01-09 DIAGNOSIS — K922 Gastrointestinal hemorrhage, unspecified: Secondary | ICD-10-CM | POA: Diagnosis present

## 2024-01-09 DIAGNOSIS — R195 Other fecal abnormalities: Secondary | ICD-10-CM | POA: Diagnosis not present

## 2024-01-09 DIAGNOSIS — K920 Hematemesis: Secondary | ICD-10-CM | POA: Diagnosis present

## 2024-01-09 DIAGNOSIS — J690 Pneumonitis due to inhalation of food and vomit: Secondary | ICD-10-CM | POA: Diagnosis present

## 2024-01-09 DIAGNOSIS — N183 Chronic kidney disease, stage 3 unspecified: Secondary | ICD-10-CM | POA: Diagnosis not present

## 2024-01-09 DIAGNOSIS — L89152 Pressure ulcer of sacral region, stage 2: Secondary | ICD-10-CM | POA: Diagnosis present

## 2024-01-09 DIAGNOSIS — N179 Acute kidney failure, unspecified: Secondary | ICD-10-CM | POA: Diagnosis present

## 2024-01-09 DIAGNOSIS — I5032 Chronic diastolic (congestive) heart failure: Secondary | ICD-10-CM | POA: Diagnosis present

## 2024-01-09 DIAGNOSIS — Z79899 Other long term (current) drug therapy: Secondary | ICD-10-CM | POA: Diagnosis not present

## 2024-01-09 DIAGNOSIS — D539 Nutritional anemia, unspecified: Secondary | ICD-10-CM | POA: Diagnosis present

## 2024-01-09 DIAGNOSIS — W44F9XA Other object of natural or organic material, entering into or through a natural orifice, initial encounter: Secondary | ICD-10-CM | POA: Diagnosis present

## 2024-01-09 DIAGNOSIS — N1831 Chronic kidney disease, stage 3a: Secondary | ICD-10-CM

## 2024-01-09 DIAGNOSIS — T17918A Gastric contents in respiratory tract, part unspecified causing other injury, initial encounter: Secondary | ICD-10-CM | POA: Diagnosis present

## 2024-01-09 DIAGNOSIS — I129 Hypertensive chronic kidney disease with stage 1 through stage 4 chronic kidney disease, or unspecified chronic kidney disease: Secondary | ICD-10-CM | POA: Diagnosis not present

## 2024-01-09 DIAGNOSIS — Z8673 Personal history of transient ischemic attack (TIA), and cerebral infarction without residual deficits: Secondary | ICD-10-CM

## 2024-01-09 DIAGNOSIS — E785 Hyperlipidemia, unspecified: Secondary | ICD-10-CM | POA: Diagnosis present

## 2024-01-09 DIAGNOSIS — Z8616 Personal history of COVID-19: Secondary | ICD-10-CM | POA: Diagnosis not present

## 2024-01-09 DIAGNOSIS — Z86718 Personal history of other venous thrombosis and embolism: Secondary | ICD-10-CM | POA: Diagnosis not present

## 2024-01-09 DIAGNOSIS — D696 Thrombocytopenia, unspecified: Secondary | ICD-10-CM | POA: Diagnosis present

## 2024-01-09 DIAGNOSIS — Z9981 Dependence on supplemental oxygen: Secondary | ICD-10-CM | POA: Diagnosis not present

## 2024-01-09 DIAGNOSIS — Z833 Family history of diabetes mellitus: Secondary | ICD-10-CM | POA: Diagnosis not present

## 2024-01-09 DIAGNOSIS — F0390 Unspecified dementia without behavioral disturbance: Secondary | ICD-10-CM

## 2024-01-09 DIAGNOSIS — I130 Hypertensive heart and chronic kidney disease with heart failure and stage 1 through stage 4 chronic kidney disease, or unspecified chronic kidney disease: Secondary | ICD-10-CM | POA: Diagnosis present

## 2024-01-09 DIAGNOSIS — N400 Enlarged prostate without lower urinary tract symptoms: Secondary | ICD-10-CM | POA: Diagnosis present

## 2024-01-09 DIAGNOSIS — F03C0 Unspecified dementia, severe, without behavioral disturbance, psychotic disturbance, mood disturbance, and anxiety: Secondary | ICD-10-CM | POA: Diagnosis not present

## 2024-01-09 DIAGNOSIS — K625 Hemorrhage of anus and rectum: Secondary | ICD-10-CM | POA: Diagnosis present

## 2024-01-09 DIAGNOSIS — K5731 Diverticulosis of large intestine without perforation or abscess with bleeding: Secondary | ICD-10-CM | POA: Diagnosis present

## 2024-01-09 DIAGNOSIS — Z66 Do not resuscitate: Secondary | ICD-10-CM | POA: Diagnosis present

## 2024-01-09 DIAGNOSIS — N1832 Chronic kidney disease, stage 3b: Secondary | ICD-10-CM | POA: Diagnosis present

## 2024-01-09 DIAGNOSIS — D62 Acute posthemorrhagic anemia: Secondary | ICD-10-CM

## 2024-01-09 DIAGNOSIS — H9193 Unspecified hearing loss, bilateral: Secondary | ICD-10-CM | POA: Diagnosis present

## 2024-01-09 DIAGNOSIS — Z7982 Long term (current) use of aspirin: Secondary | ICD-10-CM | POA: Diagnosis not present

## 2024-01-09 DIAGNOSIS — Z8249 Family history of ischemic heart disease and other diseases of the circulatory system: Secondary | ICD-10-CM | POA: Diagnosis not present

## 2024-01-09 DIAGNOSIS — I4891 Unspecified atrial fibrillation: Secondary | ICD-10-CM | POA: Diagnosis present

## 2024-01-09 LAB — BASIC METABOLIC PANEL WITH GFR
Anion gap: 15 (ref 5–15)
Anion gap: 14 (ref 5–15)
BUN: 39 mg/dL — ABNORMAL HIGH (ref 8–23)
BUN: 40 mg/dL — ABNORMAL HIGH (ref 8–23)
CO2: 24 mmol/L (ref 22–32)
CO2: 18 mmol/L — ABNORMAL LOW (ref 22–32)
Calcium: 10 mg/dL (ref 8.9–10.3)
Calcium: 9.6 mg/dL (ref 8.9–10.3)
Chloride: 103 mmol/L (ref 98–111)
Chloride: 111 mmol/L (ref 98–111)
Creatinine, Ser: 1.64 mg/dL — ABNORMAL HIGH (ref 0.61–1.24)
Creatinine, Ser: 1.67 mg/dL — ABNORMAL HIGH (ref 0.61–1.24)
GFR, Estimated: 38 mL/min — ABNORMAL LOW (ref >60)
GFR, Estimated: 37 mL/min — ABNORMAL LOW (ref >60)
Glucose, Bld: 203 mg/dL — ABNORMAL HIGH (ref 70–99)
Glucose, Bld: 178 mg/dL — ABNORMAL HIGH (ref 70–99)
Potassium: 4.3 mmol/L (ref 3.5–5.1)
Potassium: 4.5 mmol/L (ref 3.5–5.1)
Sodium: 142 mmol/L (ref 135–145)
Sodium: 143 mmol/L (ref 135–145)

## 2024-01-09 LAB — CBC
HCT: 27.3 % — ABNORMAL LOW (ref 39.0–52.0)
HCT: 29 % — ABNORMAL LOW (ref 39.0–52.0)
HCT: 26.3 % — ABNORMAL LOW (ref 39.0–52.0)
Hemoglobin: 8.4 g/dL — ABNORMAL LOW (ref 13.0–17.0)
Hemoglobin: 9.2 g/dL — ABNORMAL LOW (ref 13.0–17.0)
Hemoglobin: 8.6 g/dL — ABNORMAL LOW (ref 13.0–17.0)
MCH: 30.5 pg (ref 26.0–34.0)
MCH: 30.4 pg (ref 26.0–34.0)
MCH: 30.9 pg (ref 26.0–34.0)
MCHC: 30.8 g/dL (ref 30.0–36.0)
MCHC: 31.7 g/dL (ref 30.0–36.0)
MCHC: 32.7 g/dL (ref 30.0–36.0)
MCV: 99.3 fL (ref 80.0–100.0)
MCV: 95.7 fL (ref 80.0–100.0)
MCV: 94.6 fL (ref 80.0–100.0)
Platelets: 147 K/uL — ABNORMAL LOW (ref 150–400)
Platelets: 108 K/uL — ABNORMAL LOW (ref 150–400)
Platelets: 114 K/uL — ABNORMAL LOW (ref 150–400)
RBC: 2.75 MIL/uL — ABNORMAL LOW (ref 4.22–5.81)
RBC: 3.03 MIL/uL — ABNORMAL LOW (ref 4.22–5.81)
RBC: 2.78 MIL/uL — ABNORMAL LOW (ref 4.22–5.81)
RDW: 13.8 % (ref 11.5–15.5)
RDW: 15.5 % (ref 11.5–15.5)
RDW: 15.5 % (ref 11.5–15.5)
WBC: 12.6 K/uL — ABNORMAL HIGH (ref 4.0–10.5)
WBC: 16.6 K/uL — ABNORMAL HIGH (ref 4.0–10.5)
WBC: 19.9 K/uL — ABNORMAL HIGH (ref 4.0–10.5)
nRBC: 0 % (ref 0.0–0.2)
nRBC: 0 % (ref 0.0–0.2)
nRBC: 0 % (ref 0.0–0.2)

## 2024-01-09 LAB — PREPARE RBC (CROSSMATCH)

## 2024-01-09 LAB — IRON AND TIBC
Iron: 76 ug/dL (ref 45–182)
Saturation Ratios: 36 % (ref 17.9–39.5)
TIBC: 211 ug/dL — ABNORMAL LOW (ref 250–450)
UIBC: 135 ug/dL

## 2024-01-09 LAB — FERRITIN: Ferritin: 87 ng/mL (ref 24–336)

## 2024-01-09 MED ORDER — SODIUM CHLORIDE 0.9 % IV SOLN
3.0000 g | Freq: Two times a day (BID) | INTRAVENOUS | Status: AC
  Administered 2024-01-09 – 2024-01-13 (×9): 3 g via INTRAVENOUS
  Filled 2024-01-09 (×9): qty 8

## 2024-01-09 MED ORDER — LACTATED RINGERS IV BOLUS
1000.0000 mL | Freq: Once | INTRAVENOUS | Status: AC
  Administered 2024-01-09: 1000 mL via INTRAVENOUS

## 2024-01-09 MED ORDER — SODIUM CHLORIDE 0.9% IV SOLUTION: Freq: Once | INTRAVENOUS | Status: AC

## 2024-01-09 MED ORDER — LACTATED RINGERS IV SOLN: INTRAVENOUS | Status: DC

## 2024-01-09 NOTE — Plan of Care (Signed)
  Problem: Clinical Measurements: Goal: Will remain free from infection Outcome: Progressing Goal: Respiratory complications will improve Outcome: Progressing Goal: Cardiovascular complication will be avoided Outcome: Progressing   Problem: Coping: Goal: Level of anxiety will decrease Outcome: Progressing   Problem: Pain Managment: Goal: General experience of comfort will improve and/or be controlled Outcome: Progressing   Problem: Safety: Goal: Ability to remain free from injury will improve Outcome: Progressing   Problem: Skin Integrity: Goal: Risk for impaired skin integrity will decrease Outcome: Progressing

## 2024-01-09 NOTE — Plan of Care (Signed)
   Problem: Coping: Goal: Level of anxiety will decrease Outcome: Progressing

## 2024-01-09 NOTE — Progress Notes (Addendum)
 HD#0 SUBJECTIVE:  Patient Summary: [name] is a 89 y.o. person living with a history of history of dementia, diverticulitis, CKD3a, HTN, HLD, CVA (2013, 2021), DVT (2015), BPH s/p TURP (2015), thrombocytopenia, who presented from his rehab facility due to 3 black tarry blood and admitted for melena. .   Overnight Events:  Per night team: Paged for significant event at 2206.   [employer] called.  Patient required more than 3 persons to evaluate and treat, but patient never required ACLS.   Patient had aspiration event leading patient to becoming apneic and unresponsive.  Patient never truly lost a pulse.  On arrival to the room, patient had been actively suctioned by rapid response RN.  Oxygen  saturations in the 50s.  Nonrebreather placed.  Patient slowly came back to.   Blood pressures stable 108/66 pulse of 88.  Patient did have a large melanotic bowel movement.  Patient also had coffee-ground like emesis.  No frank red blood in emesis.   Patient was able to respond to verbal stimuli and able to follow instructions.  Oxygen  saturation trended up into the 90s on nonrebreather.   Obtained stat CBC showing hemoglobin 8.4, down from 9.9.   Given event, called granddaughter, who is patient's primary decision-maker.  Granddaughter agreed for GI consult to evaluate for potential procedure.  Granddaughter did state that patient is to remain DNR and DNI.   Consulted GI, who will follow along.  Will move patient to progressive. If patient does decompensate further patient will need further intervention tonight. Will start trending H&H. If Hgb less than 7, transfuse  Given that Protonix  40 mg has not been given yet, will load with 80 mg of IV Protonix . Start twice daily 40 mg Protonix  tomorrow.   Interim History:  Patient resting in bed, alert to team's voice though unable to say his name when asked today. BP stable with MAP > 70 while at bedside.    OBJECTIVE:  Vital Signs: Vitals:    01/09/24 0648 01/09/24 0730 01/09/24 1122 01/09/24 1513  BP: (!) 88/62 107/68 (!) 91/54 100/61  Pulse:  85 81 87  Resp: 17     Temp:  98.3 F (36.8 C) 98.6 F (37 C) 98.6 F (37 C)  TempSrc:  Axillary Axillary Axillary  SpO2:  98%  97%  Weight:      Height:       Supplemental O2: HFNC SpO2: 96 % O2 Flow Rate (L/min): 7 L/min  Filed Weights   01/08/24 1223  Weight: 85 kg     Intake/Output Summary (Last 24 hours) at 01/09/2024 9366 Last data filed at 01/09/2024 9474 Gross per 24 hour  Intake 2497.65 ml  Output 75 ml  Net 2422.65 ml   Net IO Since Admission: 2,422.65 mL [01/09/24 0633]  Physical Exam:  Constitutional: tired-appearing male sitting in exam chair, in no acute distress HEENT: normocephalic atraumatic, mucous membranes moist Cardiovascular: regular rate and rhythm, bilateral radial pulses 2+ Pulmonary/Chest: normal work of breathing on 7L HFNC, lungs clear to auscultation bilaterally Abdominal: soft, non-distended MSK: normal bulk and tone. Neurological: alert but not oriented to self  Skin: warm and dry Psych: mood calm, behavior normal   Patient Lines/Drains/Airways Status     Active Line/Drains/Airways     Name Placement date Placement time Site Days   Peripheral IV 01/08/24 20 G Right Antecubital 01/08/24  1145  Antecubital  1   Peripheral IV 01/09/24 22 G 1.75 Left;Upper Arm 01/09/24  0110  Arm  less than 1   Peripheral IV 01/09/24 20 G 2.5 Anterior;Left Forearm 01/09/24  0130  Forearm  less than 1   Wound 01/09/24 0500 Other (Comment) Coccyx Mid;Upper Stage 2 -  Partial thickness loss of dermis presenting as a shallow open injury with a red, pink wound bed without slough. 01/09/24  0500  Coccyx  less than 1            Pertinent labs and imaging:      Latest Ref Rng & Units 01/09/2024   12:47 AM 01/08/2024   10:10 PM 01/08/2024   11:51 AM  CBC  WBC 4.0 - 10.5 K/uL 12.6  12.8    Hemoglobin 13.0 - 17.0 g/dL 8.4  8.4  9.9    Hematocrit 39.0 - 52.0 % 27.3  27.5  29.0   Platelets 150 - 400 K/uL 147  149         Latest Ref Rng & Units 01/09/2024    3:11 AM 01/09/2024   12:47 AM 01/08/2024   11:51 AM  CMP  Glucose 70 - 99 mg/dL 821  796  875   BUN 8 - 23 mg/dL 40  39  37   Creatinine 0.61 - 1.24 mg/dL 8.32  8.35  8.39   Sodium 135 - 145 mmol/L 143  142  143   Potassium 3.5 - 5.1 mmol/L 4.5  4.3  4.6   Chloride 98 - 111 mmol/L 111  103  108   CO2 22 - 32 mmol/L 18  24    Calcium  8.9 - 10.3 mg/dL 9.6  89.9      DG CHEST PORT 1 VIEW Result Date: 01/09/2024 EXAM: 1 VIEW(S) XRAY OF THE CHEST 01/09/2024 12:45:30 AM COMPARISON: Portable chest yesterday at 11:32 AM. CLINICAL HISTORY: [id number] Aspiration into airway [id number]. Aspiration into airway Aspiration into airway FINDINGS: LUNGS AND PLEURA: Increased small scattered perihilar opacities on the left greater than right. In the setting of low lung volumes could represent atelectasis or pneumonia. Remaining lungs are clear. No pulmonary edema. No pleural effusion. No pneumothorax. HEART AND MEDIASTINUM: Mild cardiomegaly. No evidence of CHF. There is a tortuous aorta with atherosclerosis, stable mediastinum. BONES AND SOFT TISSUES: No acute osseous abnormality. IMPRESSION: 1. Increased small scattered perihilar opacities, left greater than right, which may represent atelectasis or pneumonia. low lung volumes. 2. Mild cardiomegaly without evidence of congestive heart failure. 3. Aortic tortuosity with atherosclerosis. Electronically signed by: Francis Quam MD 01/09/2024 12:53 AM EDT RP Workstation: [id number]   DG Chest Port 1 View Result Date: 01/08/2024 EXAM: 1 VIEW(S) XRAY OF THE CHEST 01/08/2024 11:37:00 AM COMPARISON: CTA chest 08/01/2022 chest radiograph 07/29/2022. CLINICAL HISTORY: sob. SOB. Pt BIB [facility] from [facility]. Pt had blood in stool yesterday. Reported it was dark and tarry. Hx of GI bleeds and dementia. FINDINGS: LUNGS AND PLEURA: Low lung volumes. No  focal pulmonary opacity. No pulmonary edema. No pleural effusion. No pneumothorax. HEART AND MEDIASTINUM: Aortic atherosclerosis. Tortuosity of the thoracic aorta. No acute abnormality of the cardiac and mediastinal silhouettes. BONES AND SOFT TISSUES: No acute osseous abnormality. IMPRESSION: 1. No acute process. 2. Low lung volumes. Electronically signed by: Donnice Mania MD 01/08/2024 12:25 PM EDT RP Workstation: [id number]    ASSESSMENT/PLAN:  Assessment: Principal Problem:   Upper GI bleed Active Problems:   HTN (hypertension)   History of CVA (cerebrovascular accident)   CKD (chronic kidney disease) stage 3, GFR 30-59 ml/min (HCC)   Hyperlipidemia   Chronic diastolic (congestive) heart failure (  HCC)   Plan: #Melena, coffee-ground emesis #Anemia #Hypotension #Hypoxemia  Prior to admission, patient had 3 black tarry BM at [facility], though did not have vomiting episodes and was acting like his normal self, eating and drinking appropriately with assistance. Hgb on admission of 9.3, Hct of 30.9. Overnight, he vomited and aspirated with coffee ground emesis, as well as large amount of melanotic stool, with issues of hypotension and hypoxemia thereafter. Hgb decreased from 9.3 to 8.4 in roughly 12 hours. Given his instability, he was given 1 unit PRBC and IVF bolus. AM CBC showed Hgb stable at 9.2, with no further episodes of hematemesis. He had to be placed on 7L HFNC, and is now on [place]. He did have another red/black Type 7 stool around 0525 this morning. He is being managed conservatively with IVF and IV PPI. Today improvement though with soft BP. He had brown stool on exam by GI, and no further black or bloody BM. GI was consulted, appreciate assistance.  GI Recommendations:   - Continue supportive care with PPI and IV fluids  - no current emergent need for endoscopy today  - If the patient does appear to have significant bleeding and further drops in hemoglobin, may proceed with EGD -  LR @ 150 mL/hr for 20 hrs - close monitoring of BM and signs of overt bleeding - IV Protonix  40 mg BID - IV Zofran  4 mg q6 hrs prn  - f/u CBC q8 hrs - if Hgb < 7, transfuse PRBCs - IV Unasyn  per pharm dosing for aspiration pna coverage   #Dementia Patient with dementia and oriented to self only at baseline.  - Delirium precautions    #HTN Patient has a history of HTN and per rehab facility records has been taking Losartan 50 mg BID. Last dose was given yesterday evening. He currently has low-normal BP since admission so will hold for now. He may not need BP medication upon discharge.  - Hold home Losartan 50 mg BID    #Prior CVA (2013, 2021) Patient has a history of CVA in 2013 and 2021. Unclear if there were any residual deficits. Called [facility] though RN who works with patient was not available so spoke with diplomatic services operational officer. He takes Aspirin  81 mg daily and Atorvastatin  10 mg daily, he last took aspirin  10/28 AM and Atorvastatin  on 10/27 PM. Given concern for GI bleeding, decreased Hgb and history of diverticulitis, will hold aspirin  for now. Atorvastatin  10 mg is not high intensity, so may consider discontinuing this upon discharge.  - Hold Aspirin  81 mg daily  - Hold Atorvastatin  10 mg daily    #CKD3a vs. CKD3b Patient has a history of CKD3a, with most recent Cr of ~1.5 - 1.6 in 2024 and eGFR at that time ~47. This admission with Cr of 1.6, though eGFR of 40 which puts him closer to CKD3b if this is his new normal. Cr this admission so far is at his baseline.  - f/u BMP    #Thrombocytopenia Patient has a history of thrombocytopenia, baseline appears to be 120-150s. Platelets on admission of 149. Today platelets 114. Will continue to monitor.  - CBC  Best Practice: Diet: NPO IVF: Fluids: LR, Rate: 150 cc/hr x 20 hrs VTE: SCDs Start: 01/08/24 1527 Code: DNR - Limited  Disposition planning: Family Contact: Granddaughter [name], called and notified. DISPO: Anticipated  discharge in 1-2 days to Rehab pending medical stabilization.  Signature:  Doyal Miyamoto, MD [facility]  6:33  AM, 01/09/2024  On Call pager [phone]

## 2024-01-09 NOTE — Progress Notes (Addendum)
 Evaluated at bedside for low blood pressure.  Arrived to bedside, patient slightly more lethargic and blood pressures with systolics into the 60s.  Heart rate into the 90s.  While in the room patient did flip in and out of atrial fibrillation  With concerns for hemodynamic instability, started IVF bolus and ordered for state blood transfusion  Obtained STAT cbc.  Given patient is hemodynamically unstable with low blood pressures did discuss with family at bedside regarding scoping the patient. They earlier agreed for a scope but now are debating if they want to put him through a procedure or not. They stated they wanted some more time to think about this.   Will treat supportively at this time.   Addendum: Patient stabilized with fluids and blood. BP increased to 99/75. Family stated that if he needs an EGD, that is okay with them.

## 2024-01-09 NOTE — Progress Notes (Signed)
   01/09/24 0018  Assess: MEWS Score  Temp 98.1 F (36.7 C)  BP (!) 63/49  MAP (mmHg) (!) 54  Pulse Rate 88  ECG Heart Rate (!) 105  Resp (!) 35  Level of Consciousness Alert  O2 Device HFNC  O2 Flow Rate (L/min) 15 L/min  Assess: if the MEWS score is Yellow or Red  Were vital signs accurate and taken at a resting state? Yes  Does the patient meet 2 or more of the SIRS criteria? Yes  MEWS guidelines implemented  Yes, red  Treat  MEWS Interventions Considered administering scheduled or prn medications/treatments as ordered  Take Vital Signs  Increase Vital Sign Frequency  Red: Q1hr x2, continue Q4hrs until patient remains green for 12hrs  Escalate  MEWS: Escalate Red: Discuss with charge nurse and notify provider. Consider notifying RRT. If remains red for 2 hours consider need for higher level of care  Notify: Charge Nurse/RN  Name of Charge Nurse/RN Notified Franscica RN  Provider Notification  Provider Name/Title Tobie  Date Provider Notified 01/09/24  Time Provider Notified 0018  Method of Notification Face-to-face;Call  Notification Reason Change in status  Provider response At bedside  Date of Provider Response 01/09/24  Time of Provider Response 0020  Notify: Rapid Response  Name of Rapid Response RN Notified Mindy  Date Rapid Response Notified 01/09/24  Time Rapid Response Notified 0020   Patient BP dropped after transfer to [facility] from , patient is lethargic and pale in color.  MD called and came to [facility] with Mindy RRN , see new orders

## 2024-01-09 NOTE — Progress Notes (Signed)
 Pharmacy Antibiotic Note  [name] is a 89 y.o. male admitted on 01/08/2024 with pneumonia.  Pharmacy has been consulted for unasyn  dosing.  Unasyn  starts for asp PNA. Scr 1.67, wbc 20k  Plan: Unasyn  3g IV q12   Height: 5' 8 (172.7 cm) Weight: 85 kg (187 lb 6.3 oz) IBW/kg (Calculated) : 68.4  Temp (24hrs), Avg:98 F (36.7 C), Min:97.4 F (36.3 C), Max:98.6 F (37 C)  Recent Labs  Lab 01/08/24 1146 01/08/24 1151 01/08/24 2210 01/09/24 0047 01/09/24 0311 01/09/24 0913 01/09/24 1449  WBC 10.5  --  12.8* 12.6*  --  16.6* 19.9*  CREATININE 1.57* 1.60*  --  1.64* 1.67*  --   --     Estimated Creatinine Clearance: 26.8 mL/min (A) (by C-G formula based on SCr of 1.67 mg/dL (H)).    No Known Allergies  Antimicrobials this admission: 10/29 unasyn >>  Dose adjustments this admission:   Microbiology results:    Sergio Batch, PharmD, BCIDP, AAHIVP, CPP Infectious Disease Pharmacist 01/09/2024 4:25 PM

## 2024-01-09 NOTE — Plan of Care (Signed)

## 2024-01-09 NOTE — TOC CM/SW Note (Signed)
 Transition of Care [facility]) - Inpatient Brief Assessment   Patient Details  Name: [name] MRN: [id number] Date of Birth: 08/25/1924  [facility] of [facility] CM/SW Contact:    Almarie CHRISTELLA Goodie, LCSW Phone Number: 01/09/2024, 2:05 PM   Clinical Narrative:      Patient is LTC resident at [facility], can return when stable. CSW to follow.    Transition of Care Asessment: Insurance and Status: Insurance coverage has been reviewed Patient has primary care physician: Yes Home environment has been reviewed: [facility] Prior level of function:: LTC SNF Resident Prior/Current Home Services: No current home services Social Drivers of Health Review: SDOH reviewed no interventions necessary Readmission risk has been reviewed: Yes Transition of care needs: transition of care needs identified, TOC will continue to follow

## 2024-01-09 NOTE — Consult Note (Addendum)
 Consultation Note   Referring Provider:   Internal Medicine Teaching Service PCP: Rosan Dayton BROCKS, DO Primary Gastroenterologist:   [facility]      Reason for Consultation: Hematemesis , melena DOA: 01/08/2024         Hospital Day: 2   ASSESSMENT    89 year old male admitted with dark stool and macrocytic anemia Presenting hemoglobin 9.3 ( down from 14.3 in May 2024). No interval labs labs between May and now to see if there had been a downward trend of his hemoglobin or if this is all acute.  Interestingly, on DRE today he has light brown stool in vault. BUN /Creatinine ratio only 20.3.  With IV fluids he did have an expected decline in hemoglobin decline in hemoglobin. Received a unit of blood early this a.m and hemoglobin is currently stable at 9.2  Chronic thrombocytopenia (dates back many years) Baseline platelet count 130s to 140s, currently slightly lower than baseline at 108  Dementia ( baseline only oriented to self)  CKD 3  Chronic diastolic heart failure  Hypertension  History of CVA (2013, 2021)  Remote history of DVT  See PMH for any additional medical history  / medical problems  Principal Problem:   Upper GI bleed Active Problems:   HTN (hypertension)   History of CVA (cerebrovascular accident)   CKD (chronic kidney disease) stage 3, GFR 30-59 ml/min (HCC)   Hyperlipidemia   Chronic diastolic (congestive) heart failure (HCC)   GI bleed   PLAN:   --Continue BID IV Pantoprazole  --Trend H/H. --Clear liquids okay for toda --In the absence of ongoing overt GI blood loss, advanced stage and multiple co-morbidities will hold off on endoscopic evaluation for now but that could change depending on clinical course ( and Granddaughter's wishes)    HPI   Brief history :.  Patient was seen by us  in the hospital May 2022 for evaluation of painless hematochezia and acute blood loss anemia.  CTA was negative for  active bleeding.  It did show diffuse wall thickening in the distal rectum felt to represent focal proctitis.  There was diverticulosis as well.  We did not feel that the proctitis was the cause of bleeding though it could have represented a recent stool impaction.  It was felt he had a diverticular hemorrhage which spontaneously resolved.  Interval history: No family in room at the time of my visit.  Patient has dementia.  History comes from chart Patient brought to ED last night by EMS from [facility] rehab for evaluation of blood in stool described as black and tarry and ongoing since 10/27.  Admitting team spoke over the telephone to patient's granddaughter, POA, who apparently wished to avoid endoscopic procedures.  She preferred to keep her grandfather comfortable.                                                   Pertinent Studies:   On arrival to the ED patient was hemodynamically stable though he was tachypneic . WBC 10.5, hgb 9.8 ( down from 14 in May 2024), MCV 100, platelets 108 ,  BUN 32, Cr 1.5, Alb 2.7 CXR - no acute processes  Today: : Last night O2 sat low and transiently hypotensive . Repeat CXR today>> Right apical opacity and bibasilar patchy opacities.  Hgb 9.4 Cr up slightly to 1.67   Labs and Imaging:  Recent Labs    01/08/24 1146  PROT 5.7*  ALBUMIN 2.7*  AST 18  ALT 11  ALKPHOS 57  BILITOT 0.6   Recent Labs    01/08/24 1146 01/08/24 1151 01/08/24 2210 01/09/24 0047  WBC 10.5  --  12.8* 12.6*  HGB 9.3* 9.9* 8.4* 8.4*  HCT 30.9* 29.0* 27.5* 27.3*  MCV 100.3*  --  98.6 99.3  PLT 149*  --  149* 147*   Recent Labs    01/08/24 1146 01/08/24 1151 01/09/24 0047 01/09/24 0311  NA 140 143 142 143  K 4.5 4.6 4.3 4.5  CL 107 108 103 111  CO2 26  --  24 18*  GLUCOSE 131* 124* 203* 178*  BUN 32* 37* 39* 40*  CREATININE 1.57* 1.60* 1.64* 1.67*  CALCIUM  9.8  --  10.0 9.6     DG CHEST PORT 1 VIEW EXAM: 1 VIEW(S) XRAY OF THE CHEST 01/09/2024 12:45:30  AM  COMPARISON: Portable chest yesterday at 11:32 AM.  CLINICAL HISTORY: [id number] Aspiration into airway [id number]. Aspiration into airway Aspiration into airway  FINDINGS:  LUNGS AND PLEURA: Increased small scattered perihilar opacities on the left greater than right. In the setting of low lung volumes could represent atelectasis or pneumonia.  Remaining lungs are clear. No pulmonary edema. No pleural effusion. No pneumothorax.  HEART AND MEDIASTINUM: Mild cardiomegaly. No evidence of CHF. There is a tortuous aorta with atherosclerosis, stable mediastinum.  BONES AND SOFT TISSUES: No acute osseous abnormality.  IMPRESSION: 1. Increased small scattered perihilar opacities, left greater than right, which may represent atelectasis or pneumonia. low lung volumes. 2. Mild cardiomegaly without evidence of congestive heart failure. 3. Aortic tortuosity with atherosclerosis.  Electronically signed by: Francis Quam MD 01/09/2024 12:53 AM EDT RP Workstation: [id number]     Past Medical History:  Diagnosis Date   Anemia    BPH (benign prostatic hypertrophy)    TURP 05/19/13   Cerebral embolism with cerebral infarction (HCC) 12/19/2013   Cerebrovascular accident (CVA) (HCC) 04/17/2019   Chronic kidney disease    CHRONIC KIDNEY DISEASE, 2   COVID-19 03/19/2020   1/7- 03/26/2020 presentation of generalized weakness.  Treated with Decadron . CRP peaked at 2.3 and was 0.6 at discharge.  No D-dimer performed.   Difficulty hearing, right    BILATERAL HEARING LOSS - BEST TO TRY TO SPEAK INTO LEFT EAR   DVT (deep venous thrombosis) (HCC)    Frequent falls    History of DVT (deep vein thrombosis) 06/10/2013   Provoked s/p TURP. Dx per doppler 06/10/13. Complicated by hematuria while on anticoagulation s/p TURP. Initially on lovenox  and coumadin , then stopped, then IVC filter placed 06/16/13. Anticoagulation restarted after hematuria stopped. Lovenox  was discontinued apparently on 07/29/13 with  comment on high risk for falls.   Total ~1.5 months of anticoagulation but interrupted with bleeding complication.   Hyperlipidemia    Hypertension    Incontinence of urine    SOME INCONTINENCE   Left adrenal mass 09/11/2013   Meningioma (HCC)    Stroke (HCC)    Cerebellar, 2013; WALKS WITH WALKER, ABLE TO DRESS AND BATHE HIMSELF BUT FAMILY TRIES TO PROVIDE SUPERVISION BECAUSE OF HIS HX OF FALL AND WEAKNESS LEGS, ARMS  Thrombocytopenia    Vitreous hemorrhage (HCC) 12/21/2013   OS 12/18/13 CT /MRI brain done for ataxia     Past Surgical History:  Procedure Laterality Date   CYSTOSCOPY N/A 06/13/2013   Procedure: CYSTOSCOPY FLEXIBLE BEDSIDE;  Surgeon: Morene LELON Salines, MD;  Location: [facility] OR;  Service: Urology;  Laterality: N/A;   MYRINGOTOMY WITH TUBE PLACEMENT Bilateral    TRANSURETHRAL RESECTION OF PROSTATE N/A 05/19/2013   Procedure: TRANSURETHRAL RESECTION OF THE PROSTATE WITH GYRUS INSTRUMENTS;  Surgeon: Arlena LILLETTE Gal, MD;  Location: [facility];  Service: Urology;  Laterality: N/A;    Family History  Problem Relation Age of Onset   Diabetes Mother    Heart disease Mother    Hypertension Mother    Heart disease Father    Hypertension Father    Hypertension Daughter    Thyroid disease Daughter     Prior to Admission medications   Medication Sig Start Date End Date Taking? Authorizing Provider  acetaminophen  (TYLENOL ) 325 MG tablet Take 650 mg by mouth every 6 (six) hours as needed for mild pain (pain score 1-3), moderate pain (pain score 4-6) or fever.   Yes [provider]  aspirin  81 MG EC tablet Take 1 tablet (81 mg total) by mouth daily. 07/23/20  Yes Carolynn Standing, MD  atorvastatin  (LIPITOR) 10 MG tablet Take 1 tablet (10 mg total) by mouth daily. 08/02/22  Yes Jolaine Pac, DO  Emollient (EUCERIN) lotion Apply 1 Application topically as needed for dry skin.   Yes [provider]  furosemide  (LASIX ) 20 MG tablet Take 20 mg by mouth daily. 09/17/23  Yes  [provider]  losartan (COZAAR) 50 MG tablet Take 50 mg by mouth 2 (two) times daily. 12/31/23  Yes [provider]    Current Facility-Administered Medications  Medication Dose Route Frequency Provider Last Rate Last Admin   lactated ringers  infusion   Intravenous Continuous Gomez-Caraballo, Maria, MD 100 mL/hr at 01/09/24 0155 New Bag at 01/09/24 0155   ondansetron  (ZOFRAN ) injection 4 mg  4 mg Intravenous Q6H PRN Patel, Amar, DO   4 mg at 01/08/24 2258   pantoprazole  (PROTONIX ) injection 40 mg  40 mg Intravenous Q12H Tobie Gaines, DO   40 mg at 01/09/24 9093    Allergies as of 01/08/2024   (No Known Allergies)    Social History   Socioeconomic History   Marital status: Widowed    Spouse name: Not on file   Number of children: 1   Years of education: 10th grade   Highest education level: Not on file  Occupational History   Occupation: Retired  Tobacco Use   Smoking status: Never   Smokeless tobacco: Never  Vaping Use   Vaping status: Never Used  Substance and Sexual Activity   Alcohol use: No    Alcohol/week: 0.0 standard drinks of alcohol   Drug use: No   Sexual activity: Never  Other Topics Concern   Not on file  Social History Narrative   Current Social History 03/21/2021      Patient lives with his daughter, Ethelene in a home which is 1 story. There are 3 steps up to the entrance with handrails and a ramp which the patient uses.       Patient's method of transportation is via his daughter.      The highest level of education was 10th grade      The patient is currently retired.      Identified important Relationships as his  daughter, Ethelene.       Pets : No       Interests / Fun: He enjoys sitting outside on the porch.       Current Stressors: Can't do what he used to be able to do, per his daughter       Religious / Personal Beliefs: Catholic              Social Drivers of Health   Financial Resource Strain: Low Risk   (05/30/2022)   Overall Financial Resource Strain (CARDIA)    Difficulty of Paying Living Expenses: Not very hard  Food Insecurity: Food Insecurity Present (05/30/2022)   Hunger Vital Sign    Worried About Running Out of Food in the Last Year: Sometimes true    Ran Out of Food in the Last Year: Sometimes true  Transportation Needs: No Transportation Needs (05/30/2022)   PRAPARE - Administrator, Civil Service (Medical): No    Lack of Transportation (Non-Medical): No  Physical Activity: Inactive (05/30/2022)   Exercise Vital Sign    Days of Exercise per Week: 0 days    Minutes of Exercise per Session: 60 min  Stress: No Stress Concern Present (05/30/2022)   [employer] of Occupational Health - Occupational Stress Questionnaire    Feeling of Stress : Not at all  Social Connections: Socially Isolated (05/30/2022)   Social Connection and Isolation Panel    Frequency of Communication with Friends and Family: Never    Frequency of Social Gatherings with Friends and Family: Twice a week    Attends Religious Services: Never    Database Administrator or Organizations: No    Attends Banker Meetings: Never    Marital Status: Widowed  Intimate Partner Violence: Not At Risk (05/30/2022)   Humiliation, Afraid, Rape, and Kick questionnaire    Fear of Current or Ex-Partner: No    Emotionally Abused: No    Physically Abused: No    Sexually Abused: No     Code Status   Code Status: Limited: Do not attempt resuscitation (DNR) -DNR-LIMITED -Do Not Intubate/DNI   Review of Systems: Unable to obtain secondary to dementia  Physical Exam: Vital signs in last 24 hours: Temp:  [97.4 F (36.3 C)-98.3 F (36.8 C)] 98.3 F (36.8 C) (10/29 0730) Pulse Rate:  [80-114] 85 (10/29 0730) Resp:  [11-35] 17 (10/29 0648) BP: (59-134)/(37-93) 107/68 (10/29 0730) SpO2:  [63 %-100 %] 98 % (10/29 0730) Weight:  [85 kg] 85 kg (10/28 1223) Last BM Date : 01/08/24  General:  Well  developed male in NAD, appears younger than stated age Psych:  Cooperative. Normal mood and affect Eyes: Pupils equal Ears:  Normal auditory acuity Nose: No deformity, discharge or lesions Neck:  Supple, no masses felt Lungs:  Clear to auscultation.  Heart:  Regular rate.  Abdomen:  Soft, nondistended, does not appear tender ,  active bowel sounds, no masses felt Rectal : Soft, light brown stool in vault  Msk: Symmetrical without gross deformities.  Neurologic:  Alert, oriented, grossly normal neurologically Skin:  Intact without significant lesions.    Intake/Output from previous day: 10/28 0701 - 10/29 0700 In: 3004.7 [I.V.:1609.7; Blood:395; IV Piggyback:1000] Out: 75 [Urine:75] Intake/Output this shift:  No intake/output data recorded.   Vina Dasen, NP-C   01/09/2024, 9:13 AM  -----------------------------------------------------------------  I have taken a history, reviewed the chart and examined the patient. I performed a substantive portion of this encounter, including complete performance of  at least one of the key components, in conjunction with the APP. I agree with the APP's note, impression and recommendations  89 year old male with advanced dementia admitted from care facility with several days of dark stools, with hemoglobin of 9 down from previous baseline of 14 last May.  He was hemodynamically stable on arrival to the ED, but experienced an aspiration event late last night with coffee-ground emesis, and has had problems with hypotension and hypoxemia since then. His hemoglobin dropped from 9-8, prompting a transfusion last night and appropriate response back to 9 this morning.  He has not had any more overt bleeding since the aspiration, and rectal exam showed brown stool.  Although initially the patient's family had expressed desires to avoid invasive procedures, the events of last night made them reconsider and they would like to proceed with an EGD if  necessary.  Although it is very likely that the patient experienced an upper GI bleed, it is still possible that this bleeding will resolve with medical therapy alone.  Currently, it is unclear how much of his hypotension is related to bleeding versus aspiration event.  With brown stool in the rectal vault and appropriate response to 1 unit transfusion, I am less concerned about a massive GI bleed that requires an emergent endoscopy today.  Certainly with the patient's advanced age, advanced dementia and frail state, the threshold for an endoscopy should be high and pursued only when conservative therapy has definitively failed.  Would recommend continued supportive care with PPI and IV fluids.  Continue to monitor for evidence of overt GI bleeding.  If the patient does appear to have significant bleeding and further drops in hemoglobin, then I think it would be reasonable to proceed with an upper endoscopy.  With the patient's current soft blood pressures, he would likely need to be better resuscitated in order to tolerate sedation for an EGD.   Horace Lukas E. Stacia, MD [facility]  High complex medical decision making (this includes chart review, review of results, face-to-face time used for counseling as well as treatment plan and follow-up. The patient was provided an opportunity to ask questions and all were answered. The patient agreed with the plan and demonstrated an understanding of the instructions

## 2024-01-10 DIAGNOSIS — I129 Hypertensive chronic kidney disease with stage 1 through stage 4 chronic kidney disease, or unspecified chronic kidney disease: Secondary | ICD-10-CM | POA: Diagnosis not present

## 2024-01-10 DIAGNOSIS — N1831 Chronic kidney disease, stage 3a: Secondary | ICD-10-CM | POA: Diagnosis not present

## 2024-01-10 DIAGNOSIS — K922 Gastrointestinal hemorrhage, unspecified: Secondary | ICD-10-CM | POA: Diagnosis not present

## 2024-01-10 DIAGNOSIS — F03C0 Unspecified dementia, severe, without behavioral disturbance, psychotic disturbance, mood disturbance, and anxiety: Secondary | ICD-10-CM | POA: Diagnosis not present

## 2024-01-10 DIAGNOSIS — J690 Pneumonitis due to inhalation of food and vomit: Secondary | ICD-10-CM | POA: Diagnosis not present

## 2024-01-10 LAB — TYPE AND SCREEN
ABO/RH(D): A POS
Antibody Screen: NEGATIVE
Unit division: 0

## 2024-01-10 LAB — CBC
HCT: 23 % — ABNORMAL LOW (ref 39.0–52.0)
HCT: 24.3 % — ABNORMAL LOW (ref 39.0–52.0)
HCT: 24.3 % — ABNORMAL LOW (ref 39.0–52.0)
Hemoglobin: 7.6 g/dL — ABNORMAL LOW (ref 13.0–17.0)
Hemoglobin: 7.7 g/dL — ABNORMAL LOW (ref 13.0–17.0)
Hemoglobin: 7.7 g/dL — ABNORMAL LOW (ref 13.0–17.0)
MCH: 30.9 pg (ref 26.0–34.0)
MCH: 30.3 pg (ref 26.0–34.0)
MCH: 30.2 pg (ref 26.0–34.0)
MCHC: 33 g/dL (ref 30.0–36.0)
MCHC: 31.7 g/dL (ref 30.0–36.0)
MCHC: 31.7 g/dL (ref 30.0–36.0)
MCV: 93.5 fL (ref 80.0–100.0)
MCV: 95.7 fL (ref 80.0–100.0)
MCV: 95.3 fL (ref 80.0–100.0)
Platelets: 105 K/uL — ABNORMAL LOW (ref 150–400)
Platelets: 113 K/uL — ABNORMAL LOW (ref 150–400)
Platelets: 109 K/uL — ABNORMAL LOW (ref 150–400)
RBC: 2.46 MIL/uL — ABNORMAL LOW (ref 4.22–5.81)
RBC: 2.54 MIL/uL — ABNORMAL LOW (ref 4.22–5.81)
RBC: 2.55 MIL/uL — ABNORMAL LOW (ref 4.22–5.81)
RDW: 15.3 % (ref 11.5–15.5)
RDW: 15.1 % (ref 11.5–15.5)
RDW: 14.9 % (ref 11.5–15.5)
WBC: 16 K/uL — ABNORMAL HIGH (ref 4.0–10.5)
WBC: 14 K/uL — ABNORMAL HIGH (ref 4.0–10.5)
WBC: 13.6 K/uL — ABNORMAL HIGH (ref 4.0–10.5)
nRBC: 0 % (ref 0.0–0.2)
nRBC: 0 % (ref 0.0–0.2)
nRBC: 0 % (ref 0.0–0.2)

## 2024-01-10 LAB — BASIC METABOLIC PANEL WITH GFR
Anion gap: 12 (ref 5–15)
BUN: 54 mg/dL — ABNORMAL HIGH (ref 8–23)
CO2: 25 mmol/L (ref 22–32)
Calcium: 9.4 mg/dL (ref 8.9–10.3)
Chloride: 107 mmol/L (ref 98–111)
Creatinine, Ser: 2.01 mg/dL — ABNORMAL HIGH (ref 0.61–1.24)
GFR, Estimated: 30 mL/min — ABNORMAL LOW (ref >60)
Glucose, Bld: 113 mg/dL — ABNORMAL HIGH (ref 70–99)
Potassium: 4.1 mmol/L (ref 3.5–5.1)
Sodium: 144 mmol/L (ref 135–145)

## 2024-01-10 LAB — BPAM RBC
Blood Product Expiration Date: 202511042359
ISSUE DATE / TIME: 202510290051
Unit Type and Rh: 6200

## 2024-01-10 MED ORDER — BOOST / RESOURCE BREEZE PO LIQD CUSTOM
1.0000 | Freq: Three times a day (TID) | ORAL | Status: DC
  Administered 2024-01-10 – 2024-01-15 (×14): 1 via ORAL
  Administered 2024-01-16 (×2): 237 mL via ORAL
  Filled 2024-01-10 (×3): qty 1

## 2024-01-10 MED ORDER — LACTATED RINGERS IV SOLN: INTRAVENOUS | Status: AC

## 2024-01-10 NOTE — Progress Notes (Addendum)
 HD#1 SUBJECTIVE:  Patient Summary: [name] is a 89 y.o. person living with a history of history of dementia, diverticulitis, CKD3a, HTN, HLD, CVA (2013, 2021), DVT (2015), BPH s/p TURP (2015), thrombocytopenia, who presented with melena and is admitted for medical management of GI bleed.   Overnight Events:  None  Interim History:  Patient resting in bed, alert to team's voice. Granddaughter [name] at bedside. When patient saw his granddaughter, his face lit up and he called her by his daughter's name. Supplemental O2 had been reduced to 4L which was patient's chronic oxygen  at [facility]. Vitals have been stable for the past 24 hours.    OBJECTIVE:  Vital Signs: Vitals:   01/09/24 1941 01/09/24 2359 01/10/24 0434 01/10/24 0742  BP: 113/61 101/61 114/65 (!) 103/57  Pulse: 97  92 78  Resp:  19 18   Temp: 98.3 F (36.8 C) 99.1 F (37.3 C) 98.8 F (37.1 C) 99 F (37.2 C)  TempSrc: Oral Oral Axillary Axillary  SpO2: 97% 99% 98% 100%  Weight:      Height:       Supplemental O2: HFNC SpO2: 100 % O2 Flow Rate (L/min): 6 L/min  Filed Weights   01/08/24 1223  Weight: 85 kg     Intake/Output Summary (Last 24 hours) at 01/10/2024 1041 Last data filed at 01/10/2024 0515 Gross per 24 hour  Intake --  Output 250 ml  Net -250 ml   Net IO Since Admission: 2,679.66 mL [01/10/24 1041]  Physical Exam:  Constitutional: tired-appearing male lying in hospital bed, in no acute distress HEENT: normocephalic atraumatic, mucous membranes moist Cardiovascular: regular rate and rhythm, bilateral radial pulses 2+ Pulmonary/Chest: normal work of breathing on 4L O2, lungs clear to auscultation anterior bilaterally Abdominal: soft, non-distended MSK: normal bulk and tone. Neurological: alert but not oriented to self, recognized granddaughter Skin: warm and dry Psych: mood calm, behavior normal   Patient Lines/Drains/Airways Status     Active Line/Drains/Airways     Name  Placement date Placement time Site Days   Peripheral IV 01/08/24 20 G Right Antecubital 01/08/24  1145  Antecubital  1   Peripheral IV 01/09/24 22 G 1.75 Left;Upper Arm 01/09/24  0110  Arm  less than 1   Peripheral IV 01/09/24 20 G 2.5 Anterior;Left Forearm 01/09/24  0130  Forearm  less than 1   Wound 01/09/24 0500 Other (Comment) Coccyx Mid;Upper Stage 2 -  Partial thickness loss of dermis presenting as a shallow open injury with a red, pink wound bed without slough. 01/09/24  0500  Coccyx  less than 1            Pertinent labs and imaging:      Latest Ref Rng & Units 01/10/2024    6:03 AM 01/09/2024    2:49 PM 01/09/2024    9:13 AM  CBC  WBC 4.0 - 10.5 K/uL 16.0  19.9  16.6   Hemoglobin 13.0 - 17.0 g/dL 7.6  8.6  9.2   Hematocrit 39.0 - 52.0 % 23.0  26.3  29.0   Platelets 150 - 400 K/uL 105  114  108        Latest Ref Rng & Units 01/10/2024    6:03 AM 01/09/2024    3:11 AM 01/09/2024   12:47 AM  CMP  Glucose 70 - 99 mg/dL 886  821  796   BUN 8 - 23 mg/dL 54  40  39   Creatinine 0.61 - 1.24 mg/dL  2.01  1.67  1.64   Sodium 135 - 145 mmol/L 144  143  142   Potassium 3.5 - 5.1 mmol/L 4.1  4.5  4.3   Chloride 98 - 111 mmol/L 107  111  103   CO2 22 - 32 mmol/L 25  18  24    Calcium  8.9 - 10.3 mg/dL 9.4  9.6  89.9     No results found.   ASSESSMENT/PLAN:  Assessment: Principal Problem:   Upper GI bleed Active Problems:   HTN (hypertension)   History of CVA (cerebrovascular accident)   CKD (chronic kidney disease) stage 3, GFR 30-59 ml/min (HCC)   Hyperlipidemia   Chronic diastolic (congestive) heart failure (HCC)   Rectal bleeding   GI bleed   Aspiration pneumonia of both lungs due to vomit (HCC)   Plan: #Melena, coffee-ground emesis #Anemia #Hypotension #Hypoxemia  Patient presented with melanotic stools that began a day before his admission from his rehab facility. This admission thus far was given 1 unit PRBC and IVF bolus due to hypotension following  aspiration event after patient vomited. Trending CBC q8 hrs 9.2 --> 8.6 --> 7.6. He did have another red/black Type 7 stool around 1940 last night, but no more as of this morning. He is being managed conservatively with IVF and IV PPI, transfusion as needed. IV Unasyn  on board for aspiration pneumonia coverage. Today improvement though with continued soft BP. GI was consulted, appreciate assistance. Will make sure GI does not recommend scope today before resuming CLD.  GI Recommendations:   - Continue supportive care with PPI and IV fluids  - no current emergent need for endoscopy today  - If the patient does appear to have significant bleeding and further drops in hemoglobin, may proceed with EGD - f/u 10/30 recs, if no scope plans then will begin CLD  - LR @ 125 mL/hr for 10 hrs - close monitoring of BM and signs of overt bleeding - IV Protonix  40 mg BID - IV Zofran  4 mg q6 hrs prn  - f/u CBC q8 hrs - if Hgb < 7, transfuse PRBCs - IV Unasyn  per pharm dosing for aspiration pneumonia coverage   #Dementia Patient with dementia and oriented to self only at baseline.  - Delirium precautions    #HTN Patient has a history of HTN and per rehab facility records has been taking Losartan 50 mg BID. Last dose was given yesterday evening. He currently has low-normal BP since admission so will hold for now. He may not need BP medication upon discharge.  - Hold home Losartan 50 mg BID    #Prior CVA (2013, 2021) Patient has a history of CVA in 2013 and 2021. Unclear if there were any residual deficits. Called [facility] though RN who works with patient was not available so spoke with diplomatic services operational officer. He takes Aspirin  81 mg daily and Atorvastatin  10 mg daily, he last took aspirin  10/28 AM and Atorvastatin  on 10/27 PM. Given concern for GI bleeding, decreased Hgb and history of diverticulitis, will hold aspirin  for now. Atorvastatin  10 mg is not high intensity, so may consider discontinuing this upon  discharge.  - Hold Aspirin  81 mg daily  - Hold Atorvastatin  10 mg daily    #CKD3a vs. CKD3b Patient has a history of CKD3a, with most recent Cr of ~1.5 - 1.6 in 2024 and eGFR at that time ~47. This admission with Cr of 1.6, though eGFR of 40 which puts him closer to CKD3b if this is his  new normal. Cr this admission so far is at his baseline.  - f/u BMP    #Thrombocytopenia Patient has a history of thrombocytopenia, baseline appears to be 120-150s. Platelets on admission of 149. Today platelets 105. Will continue to monitor.  - CBC  Best Practice: Diet: NPO IVF: Fluids: LR, Rate: 125 cc/hr x 10 hrs VTE: SCDs Start: 01/08/24 1527 Code: DNR - Limited  Disposition planning: Family Contact: Granddaughter Rae, at bedside. DISPO: Anticipated discharge in 1-2 days to Rehab pending medical stabilization.  Signature:  Doyal Miyamoto, MD [facility]  10:41 AM, 01/10/2024  On Call pager [phone]

## 2024-01-10 NOTE — Progress Notes (Addendum)
 Daily Progress Note  DOA: 01/08/2024 Hospital Day: 3  Cc: Hematemesis and melena  ASSESSMENT    89 year old male with dementia  (oriented to self only ) admitted with dark stool and macrocytic anemia Presenting hemoglobin 9.3 ( down from 14.3 in May 2024). No interval labs labs between May and now to see if there had been a downward trend of his hemoglobin or if this is all acute.  Interestingly, on my DRE on day of admission he had light brown stool in vault. BUN / Creatinine ratio only 20.3.  With IV fluids he did have an expected decline in hemoglobin Received a unit of blood with improvement in hgb.  Blood Transfusions:  01/09/24  1 unit  TODAY >> no one in room at time of my visit . Hgb declined some overnight from 8.6 to 7.6.  I reached out to patient's nurse.  No overt bleeding so far on her shift but she did get an report that he had a dark bloody bowel movement last night and another 1 during the night.  It does not appear he had any periods of hemodynamic instability during that time.  His BUN is up from 40-54 today but his creatinine is up as well.  He is getting maintenance IV fluids at 125 mL/an hour  Hypoxemia Possible aspiration pneumonia On Unasyn   Chronic thrombocytopenia (dates back many years) Baseline platelet count 130s to 140s, currently slightly lower than baseline at 105  Dementia ( baseline only oriented to self)   CKD 3   Chronic diastolic heart failure   Hypertension   History of CVA (2013, 2021)   Remote history of DVT   See PMH for any additional medical history  / medical problems    Principal Problem:   Upper GI bleed Active Problems:   HTN (hypertension)   History of CVA (cerebrovascular accident)   CKD (chronic kidney disease) stage 3, GFR 30-59 ml/min (HCC)   Hyperlipidemia   Chronic diastolic (congestive) heart failure (HCC)   Rectal bleeding   GI bleed   Aspiration pneumonia of both lungs due to vomit (HCC)    PLAN    --No endoscopic evaluation planned for now.  Will reevaluate if patient becomes hemodynamically unstable -- Trend H&H -- Continue twice daily IV pantoprazole   Subjective   No complaints.  Patient is alert.  Does not answer most questions    Objective   GI Studies:    Recent Labs    01/09/24 0913 01/09/24 1449 01/10/24 0603  WBC 16.6* 19.9* 16.0*  HGB 9.2* 8.6* 7.6*  HCT 29.0* 26.3* 23.0*  MCV 95.7 94.6 93.5  PLT 108* 114* 105*   Recent Labs    01/09/24 0311  FERRITIN 87  TIBC 211*  IRONPCTSAT 36   Recent Labs    01/09/24 0047 01/09/24 0311 01/10/24 0603  NA 142 143 144  K 4.3 4.5 4.1  CL 103 111 107  CO2 24 18* 25  GLUCOSE 203* 178* 113*  BUN 39* 40* 54*  CREATININE 1.64* 1.67* 2.01*  CALCIUM  10.0 9.6 9.4   Recent Labs    01/08/24 1146  PROT 5.7*  ALBUMIN 2.7*  AST 18  ALT 11  ALKPHOS 57  BILITOT 0.6      Imaging:  DG CHEST PORT 1 VIEW EXAM: 1 VIEW(S) XRAY OF THE CHEST 01/09/2024 08:58:15 AM  COMPARISON: 01/09/2024  CLINICAL HISTORY: Hypoxia 200808. Reason for exam - hypoxia.  FINDINGS:  LUNGS AND PLEURA: Low lung volumes.  Right apical opacity. Bibasilar patchy opacities. No pulmonary edema. No pleural effusion. No pneumothorax.  HEART AND MEDIASTINUM: The cardiac silhouette shows no acute abnormality. The mediastinal silhouette demonstrates a tortuous thoracic aorta.  BONES AND SOFT TISSUES: No acute osseous abnormality.  IMPRESSION: 1. Right apical opacity and bibasilar patchy opacities, possibly related to hypoxia. 2. Low lung volumes.  Electronically signed by: Evalene Coho MD 01/09/2024 09:18 AM EDT RP Workstation: [id number] DG CHEST PORT 1 VIEW EXAM: 1 VIEW(S) XRAY OF THE CHEST 01/09/2024 12:45:30 AM  COMPARISON: Portable chest yesterday at 11:32 AM.  CLINICAL HISTORY: [id number] Aspiration into airway [id number]. Aspiration into airway Aspiration into airway  FINDINGS:  LUNGS AND PLEURA: Increased  small scattered perihilar opacities on the left greater than right. In the setting of low lung volumes could represent atelectasis or pneumonia.  Remaining lungs are clear. No pulmonary edema. No pleural effusion. No pneumothorax.  HEART AND MEDIASTINUM: Mild cardiomegaly. No evidence of CHF. There is a tortuous aorta with atherosclerosis, stable mediastinum.  BONES AND SOFT TISSUES: No acute osseous abnormality.  IMPRESSION: 1. Increased small scattered perihilar opacities, left greater than right, which may represent atelectasis or pneumonia. low lung volumes. 2. Mild cardiomegaly without evidence of congestive heart failure. 3. Aortic tortuosity with atherosclerosis.  Electronically signed by: Francis Quam MD 01/09/2024 12:53 AM EDT RP Workstation: [id number]     Scheduled inpatient medications:   pantoprazole  (PROTONIX ) IV  40 mg Intravenous Q12H   Continuous inpatient infusions:   ampicillin -sulbactam (UNASYN ) IV 3 g (01/10/24 0514)   lactated ringers  125 mL/hr at 01/10/24 1054   PRN inpatient medications: ondansetron  (ZOFRAN ) IV  Vital signs in last 24 hours: Temp:  [98.3 F (36.8 C)-99.1 F (37.3 C)] 98.6 F (37 C) (10/30 1112) Pulse Rate:  [72-97] 72 (10/30 1112) Resp:  [18-19] 18 (10/30 0434) BP: (100-114)/(57-65) 107/59 (10/30 1112) SpO2:  [96 %-100 %] 96 % (10/30 1112) Last BM Date : 01/09/24  Intake/Output Summary (Last 24 hours) at 01/10/2024 1329 Last data filed at 01/10/2024 0515 Gross per 24 hour  Intake --  Output 250 ml  Net -250 ml    Intake/Output from previous day: 10/29 0701 - 10/30 0700 In: -  Out: 250 [Urine:250] Intake/Output this shift: No intake/output data recorded.   Physical Exam:  General: Alert male in NAD Heart:  Regular rate .  Pulmonary: Normal respiratory effort Abdomen: Soft, nondistended, nontender. Normal bowel sounds. Neurologic: Alert and  Psych: Pleasant. Cooperative     LOS: 1 day   Vina Dasen ,NP  01/10/2024, 1:29 PM   -------------------------------------------------------------------------------------------------  I have taken a history, reviewed the chart and examined the patient. I performed a substantive portion of this encounter, including complete performance of at least one of the key components, in conjunction with the APP. I agree with the APP's note, impression and recommendations  Patient's bleeding appears to have slowed substantially.  He has not passed any stool today.  His blood pressure has been soft but is increasingly better throughout the day.  Although bleeding may not have subsided completely, I do not think he requires an urgent endoscopy today.  As stated before, given the patient's advanced dementia/age/frailty, would only recommend EGD to perform hemostasis in setting of significant, ongoing bleeding.  I am hopeful that the bleeding will continue slow/resolve with ongoing medical therapy.  He has increased leukocytosis and was started on empiric antibiotics for suspected aspiration pneumonia.    Ok for clear liquid diet.  Continue to trend CBC/BUN and  monitor/document stools.   Mariadelosang Wynns E. Stacia, MD  [facility]  Moderate complex medical decision making (this includes chart review, review of results, face-to-face time used for counseling as well as treatment plan and follow-up.

## 2024-01-10 NOTE — Evaluation (Signed)
 Clinical/Bedside Swallow Evaluation Patient Details  Name: [name] MRN: [id number] Date of Birth: 10/28/1924  Today's Date: 01/10/2024 Time: SLP Start Time (ACUTE ONLY): 1133 SLP Stop Time (ACUTE ONLY): 1145 SLP Time Calculation (min) (ACUTE ONLY): 12 min  Past Medical History:  Past Medical History:  Diagnosis Date   Anemia    BPH (benign prostatic hypertrophy)    TURP 05/19/13   Cerebral embolism with cerebral infarction (HCC) 12/19/2013   Cerebrovascular accident (CVA) (HCC) 04/17/2019   Chronic kidney disease    CHRONIC KIDNEY DISEASE, 2   COVID-19 03/19/2020   1/7- 03/26/2020 presentation of generalized weakness.  Treated with Decadron . CRP peaked at 2.3 and was 0.6 at discharge.  No D-dimer performed.   Difficulty hearing, right    BILATERAL HEARING LOSS - BEST TO TRY TO SPEAK INTO LEFT EAR   DVT (deep venous thrombosis) (HCC)    Frequent falls    History of DVT (deep vein thrombosis) 06/10/2013   Provoked s/p TURP. Dx per doppler 06/10/13. Complicated by hematuria while on anticoagulation s/p TURP. Initially on lovenox  and coumadin , then stopped, then IVC filter placed 06/16/13. Anticoagulation restarted after hematuria stopped. Lovenox  was discontinued apparently on 07/29/13 with comment on high risk for falls.   Total ~1.5 months of anticoagulation but interrupted with bleeding complication.   Hyperlipidemia    Hypertension    Incontinence of urine    SOME INCONTINENCE   Left adrenal mass 09/11/2013   Meningioma (HCC)    Stroke (HCC)    Cerebellar, 2013; WALKS WITH WALKER, ABLE TO DRESS AND BATHE HIMSELF BUT FAMILY TRIES TO PROVIDE SUPERVISION BECAUSE OF HIS HX OF FALL AND WEAKNESS LEGS, ARMS    Thrombocytopenia    Vitreous hemorrhage (HCC) 12/21/2013   OS 12/18/13 CT /MRI brain done for ataxia    Past Surgical History:  Past Surgical History:  Procedure Laterality Date   CYSTOSCOPY N/A 06/13/2013   Procedure: CYSTOSCOPY FLEXIBLE BEDSIDE;  Surgeon: Morene LELON Salines, MD;   Location: [facility] OR;  Service: Urology;  Laterality: N/A;   MYRINGOTOMY WITH TUBE PLACEMENT Bilateral    TRANSURETHRAL RESECTION OF PROSTATE N/A 05/19/2013   Procedure: TRANSURETHRAL RESECTION OF THE PROSTATE WITH GYRUS INSTRUMENTS;  Surgeon: Arlena LILLETTE Gal, MD;  Location: [facility] ORS;  Service: Urology;  Laterality: N/A;   HPI:  89 year old male with advanced dementia admitted from care facility with several days of dark stools, with hemoglobin of 9 down from previous baseline of 14 last May.  He was hemodynamically stable on arrival to the ED, but experienced an aspiration event late last night with coffee-ground emesis, and has had problems with hypotension and hypoxemia since then. CXR following aspiration event showing increased bilateral opacities. Patient seen by SLP at bedside 07/31/22 following and unreponsive episode after vomitting as well, found to have a normal oropharyngeal swallow. Seen also in 2020 at bedside and placed on a dysphagia 1, thin liquid diet at that time. No completion of instrumental study found in chart.    Assessment / Plan / Recommendation  Clinical Impression  Patient presents with a normal oropharyngeal swallow with trials of thin/clear liquids via straw even when challenged with multiple consecutive large sips. Patient consumed well over 10 ounces without overt s/s of aspiration. Did not test other consistencies given current GI dietary restrictions although received a message following visit that per GI, patient may have trials during eval only but otherwise he should not be fed today. Based on his history of relatively normal swallowing function  however, it is reasonable for MD to advance his diet if indicated. Will plan to f/u next date to observe with solids as GI condition improves. SLP Visit Diagnosis: Dysphagia, unspecified (R13.10)       Diet Recommendation Thin liquid (clear liquids)    Liquid Administration via: Cup;Straw Medication Administration: Whole meds  with liquid Supervision: Staff to assist with self feeding Postural Changes: Seated upright at 90 degrees    Other  Recommendations Oral Care Recommendations: Oral care BID        Functional Status Assessment Patient has had a recent decline in their functional status and demonstrates the ability to make significant improvements in function in a reasonable and predictable amount of time.  Frequency and Duration min 1 x/week  1 week       Prognosis Prognosis for improved oropharyngeal function: Good      Swallow Study   General HPI: 89 year old male with advanced dementia admitted from care facility with several days of dark stools, with hemoglobin of 9 down from previous baseline of 14 last May.  He was hemodynamically stable on arrival to the ED, but experienced an aspiration event late last night with coffee-ground emesis, and has had problems with hypotension and hypoxemia since then. CXR following aspiration event showing increased bilateral opacities. Patient seen by SLP at bedside 07/31/22 following and unreponsive episode after vomitting as well, found to have a normal oropharyngeal swallow. Seen also in 2020 at bedside and placed on a dysphagia 1, thin liquid diet at that time. No completion of instrumental study found in chart. Type of Study: Bedside Swallow Evaluation Previous Swallow Assessment: see HPI Diet Prior to this Study: Thin liquids (Level 0) (clear liquids) Temperature Spikes Noted: No Respiratory Status: Room air History of Recent Intubation: No Behavior/Cognition: Alert;Cooperative;Requires cueing Oral Cavity Assessment: Within Functional Limits Oral Care Completed by SLP: No Oral Cavity - Dentition: Adequate natural dentition;Dentures, top Self-Feeding Abilities: Needs assist Patient Positioning: Upright in bed Baseline Vocal Quality: Normal Volitional Cough: Cognitively unable to elicit Volitional Swallow: Unable to elicit    Oral/Motor/Sensory Function  Overall Oral Motor/Sensory Function: Within functional limits   Ice Chips Ice chips: Within functional limits Presentation: Spoon   Thin Liquid Thin Liquid: Within functional limits Presentation: Straw    Nectar Thick Nectar Thick Liquid: Not tested   Honey Thick Honey Thick Liquid: Not tested   Puree Puree: Not tested   Solid     Solid: Not tested     Hindy Perrault MA, CCC-SLP  Gaetano Romberger Meryl 01/10/2024,12:00 PM

## 2024-01-10 NOTE — Progress Notes (Signed)
 SLP Cancellation Note  Patient Details Name: [name] MRN: [id number] DOB: Feb 11, 1925   Cancelled treatment:       Reason Eval/Treat Not Completed: Medical issues which prohibited therapy. Discussed patient with RN prior to eval. Per RN, patient NPO due to GI concerns. Note both a NPO and clear liquid order in chart.  Unclear if patient may have pos for evaluation at this time. Will need to confirm with MD before proceeding.   Rea Pass MA, CCC-SLP    [name] 01/10/2024, 11:37 AM

## 2024-01-11 DIAGNOSIS — K922 Gastrointestinal hemorrhage, unspecified: Secondary | ICD-10-CM | POA: Diagnosis not present

## 2024-01-11 DIAGNOSIS — J690 Pneumonitis due to inhalation of food and vomit: Secondary | ICD-10-CM | POA: Diagnosis not present

## 2024-01-11 DIAGNOSIS — F0390 Unspecified dementia without behavioral disturbance: Secondary | ICD-10-CM | POA: Diagnosis not present

## 2024-01-11 DIAGNOSIS — I129 Hypertensive chronic kidney disease with stage 1 through stage 4 chronic kidney disease, or unspecified chronic kidney disease: Secondary | ICD-10-CM | POA: Diagnosis not present

## 2024-01-11 DIAGNOSIS — Z7982 Long term (current) use of aspirin: Secondary | ICD-10-CM

## 2024-01-11 LAB — BASIC METABOLIC PANEL WITH GFR
Anion gap: 12 (ref 5–15)
BUN: 46 mg/dL — ABNORMAL HIGH (ref 8–23)
CO2: 22 mmol/L (ref 22–32)
Calcium: 9.5 mg/dL (ref 8.9–10.3)
Chloride: 109 mmol/L (ref 98–111)
Creatinine, Ser: 1.62 mg/dL — ABNORMAL HIGH (ref 0.61–1.24)
GFR, Estimated: 38 mL/min — ABNORMAL LOW (ref >60)
Glucose, Bld: 132 mg/dL — ABNORMAL HIGH (ref 70–99)
Potassium: 3.3 mmol/L — ABNORMAL LOW (ref 3.5–5.1)
Sodium: 143 mmol/L (ref 135–145)

## 2024-01-11 LAB — CBC
HCT: 24.9 % — ABNORMAL LOW (ref 39.0–52.0)
HCT: 24.3 % — ABNORMAL LOW (ref 39.0–52.0)
Hemoglobin: 8 g/dL — ABNORMAL LOW (ref 13.0–17.0)
Hemoglobin: 7.8 g/dL — ABNORMAL LOW (ref 13.0–17.0)
MCH: 30.8 pg (ref 26.0–34.0)
MCH: 30.1 pg (ref 26.0–34.0)
MCHC: 32.1 g/dL (ref 30.0–36.0)
MCHC: 32.1 g/dL (ref 30.0–36.0)
MCV: 95.8 fL (ref 80.0–100.0)
MCV: 93.8 fL (ref 80.0–100.0)
Platelets: 103 K/uL — ABNORMAL LOW (ref 150–400)
Platelets: 119 K/uL — ABNORMAL LOW (ref 150–400)
RBC: 2.6 MIL/uL — ABNORMAL LOW (ref 4.22–5.81)
RBC: 2.59 MIL/uL — ABNORMAL LOW (ref 4.22–5.81)
RDW: 15.1 % (ref 11.5–15.5)
RDW: 14.8 % (ref 11.5–15.5)
WBC: 12.4 K/uL — ABNORMAL HIGH (ref 4.0–10.5)
WBC: 12.3 K/uL — ABNORMAL HIGH (ref 4.0–10.5)
nRBC: 0 % (ref 0.0–0.2)
nRBC: 0 % (ref 0.0–0.2)

## 2024-01-11 MED ORDER — POTASSIUM CHLORIDE 10 MEQ/100ML IV SOLN
10.0000 meq | INTRAVENOUS | Status: AC
  Administered 2024-01-11 (×4): 10 meq via INTRAVENOUS
  Filled 2024-01-11 (×4): qty 100

## 2024-01-11 MED ORDER — PANTOPRAZOLE SODIUM 40 MG IV SOLR
40.0000 mg | Freq: Two times a day (BID) | INTRAVENOUS | Status: DC
  Administered 2024-01-11 – 2024-01-16 (×10): 40 mg via INTRAVENOUS
  Filled 2024-01-11 (×10): qty 10

## 2024-01-11 NOTE — Progress Notes (Signed)
 SLP Cancellation Note  Patient Details Name: [name] MRN: [id number] DOB: Feb 17, 1925   Cancelled treatment:        RN reports good tolerance of POs.  Pt continues with clear liquid diet at this time.  SLP will follow to evaluate solid textures once appropriate for diet advancement per GI.  Anette FORBES Grippe, MA, CCC-SLP Acute Rehabilitation Services Office: [phone] 01/11/2024, 11:09 AM

## 2024-01-11 NOTE — Plan of Care (Signed)
   Problem: Health Behavior/Discharge Planning: Goal: Ability to manage health-related needs will improve Outcome: Progressing

## 2024-01-11 NOTE — Progress Notes (Addendum)
 Progress Note   LOS: 2 days   Chief Complaint: Melena   Subjective   Nurse reports 1 nonbloody bowel movement earlier this morning.  Patient is alert.  Does not answer questions.     Objective   Vital signs in last 24 hours: Temp:  [97.5 F (36.4 C)-98.6 F (37 C)] 97.8 F (36.6 C) (10/31 0800) Pulse Rate:  [72-100] 99 (10/31 0406) Resp:  [13-20] 20 (10/31 0800) BP: (107-146)/(59-95) 140/95 (10/31 0800) SpO2:  [93 %-96 %] 93 % (10/31 0800) Last BM Date : 01/10/24 Last BM recorded by nurses in past 5 days Stool Type: Type 6 (Mushy consistency with ragged edges) (01/11/2024  8:30 AM)  General:   male in no acute distress  Heart:  Regular rate and rhythm; no murmurs Pulm: Clear anteriorly; no wheezing Abdomen: soft, nondistended, normal bowel sounds in all quadrants. Nontender without guarding. No organomegaly appreciated. Extremities:  No edema Neurologic: Baseline dementia Psych:  Cooperative. Normal mood and affect.  Intake/Output from previous day: 10/30 0701 - 10/31 0700 In: 2346.6 [P.O.:980; I.V.:1266.6; IV Piggyback:100] Out: 1200 [Urine:1200] Intake/Output this shift: Total I/O In: 560 [P.O.:360; IV Piggyback:200] Out: 600 [Urine:600]  Studies/Results: No results found.  Lab Results: Recent Labs    01/10/24 2235 01/11/24 0150 01/11/24 0807  WBC 13.6* 12.4* 12.3*  HGB 7.7* 8.0* 7.8*  HCT 24.3* 24.9* 24.3*  PLT 109* 103* 119*   BMET Recent Labs    01/09/24 0311 01/10/24 0603 01/11/24 0150  NA 143 144 143  K 4.5 4.1 3.3*  CL 111 107 109  CO2 18* 25 22  GLUCOSE 178* 113* 132*  BUN 40* 54* 46*  CREATININE 1.67* 2.01* 1.62*  CALCIUM  9.6 9.4 9.5   LFT Recent Labs    01/08/24 1146  PROT 5.7*  ALBUMIN 2.7*  AST 18  ALT 11  ALKPHOS 57  BILITOT 0.6   PT/INR No results for input(s): LABPROT, INR in the last 72 hours.   Scheduled Meds:  feeding supplement  1 Container Oral TID BM   pantoprazole  (PROTONIX ) IV  40 mg Intravenous  Q12H   Continuous Infusions:  ampicillin -sulbactam (UNASYN ) IV 3 g (01/11/24 0511)   potassium chloride  10 mEq (01/11/24 0929)     Impression:   89 year old male with dementia  (oriented to self only ) admitted with dark stool and macrocytic anemia Presenting hemoglobin 9.3 ( down from 14.3 in May 2024). No interval labs labs between May and now to see if there had been a downward trend of his hemoglobin or if this is all acute.  Interestingly, on my DRE on day of admission he had light brown stool in vault.  With IV fluids he did have an expected decline in hemoglobin Received a unit of blood with improvement in hgb.  Blood Transfusions:  01/09/24  1 unit  TODAY: Nurse in the room at the time of my visit.  Hemoglobin is stable at 7.8.  Had 1 stool which was nonbloody.  Last bloody stool was 10/20 9 PM.  He is getting maintenance IV fluids at 125 mL/h.  BUN 46/creatinine 1.62 (improved)  Hypoxemia Possible aspiration pneumonia On Unasyn    Chronic thrombocytopenia (dates back many years) Baseline platelet count 130s to 140s, currently slightly lower than baseline at 105   Dementia ( baseline only oriented to self)   CKD 3   Chronic diastolic heart failure   Hypertension   History of CVA (2013, 2021)   Remote history of DVT  See PMH for any additional medical history  / medical problems   Plan:   -- Continue daily CBC and transfuse as needed to maintain HGB > 7  -- No endoscopic evaluation planned for now unless patient becomes hemodynamically unstable - Continue PPI IV twice daily   [name]  01/11/2024, 10:45 AM   ---------------------------------------------------------------------------  I have taken a history, reviewed the chart and examined the patient. I performed a substantive portion of this encounter, including complete performance of at least one of the key components, in conjunction with the APP. I agree with the APP's note, impression and  recommendations.  Bleeding appears to have slowed significantly.  Hgb stable this morning.  Nonbloody bowel movement per nursing. Continue to treat medically.  Ok to advance diet to full liquids today from GI standpoint. No plans for EGD unless recurrent severe bleeding.  GI will sign off at this time.  Please reconsult if concern for recurrent significant bleeding/hemodynamic instability or any other questions.  Would recommend BID PPI IV while in hospital, then transition to PO BID PPI for 8 weeks, followed by once daily PPI indefinitely.  Avoid all NSAIDs indefinitely.  Jimmy Willems E. Stacia, MD [facility]

## 2024-01-11 NOTE — Progress Notes (Signed)
 HD#2 SUBJECTIVE:  Patient Summary: [name] is a 89 y.o. person living with a history of history of dementia, diverticulitis, CKD3a, HTN, HLD, CVA (2013, 2021), DVT (2015), BPH s/p TURP (2015), thrombocytopenia, who presented with melena and is admitted for medical management of GI bleed.   Overnight Events:  None  Interim History:  Sitting up in bed, more alert and interactive compared to yesterday. He is oriented at baseline to his full name. Looking improved today. One documented stool yesterday which was Type 7 brown, no further episodes of emesis or black/bloody BM.    OBJECTIVE:  Vital Signs: Vitals:   01/10/24 1500 01/10/24 1951 01/10/24 2333 01/11/24 0406  BP: 134/73 (!) 145/83 123/69 (!) 146/87  Pulse: 99 100 100 99  Resp:  20 20 20   Temp: 98.3 F (36.8 C) (!) 97.5 F (36.4 C) 98.4 F (36.9 C) 97.8 F (36.6 C)  TempSrc: Axillary Oral Oral Oral  SpO2: 95% 94% 94% 96%  Weight:      Height:       Supplemental O2: HFNC SpO2: 96 % O2 Flow Rate (L/min): 5 L/min  Filed Weights   01/08/24 1223  Weight: 85 kg     Intake/Output Summary (Last 24 hours) at 01/11/2024 0724 Last data filed at 01/11/2024 0400 Gross per 24 hour  Intake 2346.55 ml  Output 1200 ml  Net 1146.55 ml   Net IO Since Admission: 3,826.21 mL [01/11/24 0724]  Physical Exam:  Constitutional: alert-appearing male lying in hospital bed, in no acute distress HEENT: normocephalic atraumatic, mucous membranes moist Cardiovascular: regular rate and rhythm, bilateral radial pulses 2+ Pulmonary/Chest: normal work of breathing on 5L O2, lungs clear to auscultation anterior bilaterally Abdominal: soft, non-distended MSK: normal bulk and tone. Neurological: alert and oriented at baseline to his full name Skin: warm and dry Psych: mood calm, behavior normal   Patient Lines/Drains/Airways Status     Active Line/Drains/Airways     Name Placement date Placement time Site Days   Peripheral IV  01/08/24 20 G Right Antecubital 01/08/24  1145  Antecubital  1   Peripheral IV 01/09/24 22 G 1.75 Left;Upper Arm 01/09/24  0110  Arm  less than 1   Peripheral IV 01/09/24 20 G 2.5 Anterior;Left Forearm 01/09/24  0130  Forearm  less than 1   Wound 01/09/24 0500 Other (Comment) Coccyx Mid;Upper Stage 2 -  Partial thickness loss of dermis presenting as a shallow open injury with a red, pink wound bed without slough. 01/09/24  0500  Coccyx  less than 1            Pertinent labs and imaging:      Latest Ref Rng & Units 01/11/2024    1:50 AM 01/10/2024   10:35 PM 01/10/2024    6:19 PM  CBC  WBC 4.0 - 10.5 K/uL 12.4  13.6  14.0   Hemoglobin 13.0 - 17.0 g/dL 8.0  7.7  7.7   Hematocrit 39.0 - 52.0 % 24.9  24.3  24.3   Platelets 150 - 400 K/uL 103  109  113        Latest Ref Rng & Units 01/11/2024    1:50 AM 01/10/2024    6:03 AM 01/09/2024    3:11 AM  CMP  Glucose 70 - 99 mg/dL 867  886  821   BUN 8 - 23 mg/dL 46  54  40   Creatinine 0.61 - 1.24 mg/dL 8.37  7.98  8.32   Sodium 135 -  145 mmol/L 143  144  143   Potassium 3.5 - 5.1 mmol/L 3.3  4.1  4.5   Chloride 98 - 111 mmol/L 109  107  111   CO2 22 - 32 mmol/L 22  25  18    Calcium  8.9 - 10.3 mg/dL 9.5  9.4  9.6     No results found.   ASSESSMENT/PLAN:  Assessment: Principal Problem:   Upper GI bleed Active Problems:   HTN (hypertension)   History of CVA (cerebrovascular accident)   CKD (chronic kidney disease) stage 3, GFR 30-59 ml/min (HCC)   Hyperlipidemia   Chronic diastolic (congestive) heart failure (HCC)   Rectal bleeding   GI bleed   Aspiration pneumonia of both lungs due to vomit (HCC)   Plan: #Melena, coffee-ground emesis #Anemia #Hypotension #Hypoxemia  #Leukocytosis, improving  Patient presented with melanotic stools that began a day before his admission from his rehab facility. This admission thus far was given 1 unit PRBC and IVF bolus due to hypotension following aspiration event after patient  vomited. Trending CBC q8 hrs 9.2 --> 8.6 --> 7.6 --> 7.7 --> 8.0. He did have another red/black Type 7 stool around 1940 last night, but no more as of this morning. He is being managed conservatively with IVF and IV PPI, transfusion as needed. IV Unasyn  on board for aspiration pneumonia coverage. Today improvement of blood pressure and stable/improving Hgb 8.0. Leukocytosis also improving, 12.4 down from 19.9. GI was consulted, appreciate assistance. He was resumed on CLD on 10/30 after confirmation from GI for no planned scope. In the last 24 hours he has not had any more bloody stools, most recent stool brown Type 7.  GI Recommendations:   - Continue supportive care  - no current emergent need for endoscopy today  - only recommend EGD to perform hemostasis in setting of significant, ongoing bleeding  - f/u 10/31 recs - s/p LR @ 125 mL/hr for 10 hrs - close monitoring of BM and signs of overt bleeding - IV Protonix  40 mg BID - IV Zofran  4 mg q6 hrs prn  - f/u CBC q8 hrs - if Hgb < 7, transfuse PRBCs - IV Unasyn  per pharm dosing for aspiration pneumonia coverage (i10/29 - ) for 5 days unless worsening clinically or increased O2 requirement  - 10/31: replenished Kcl: given IV KCl 40 mEq - CLD   #Dementia Patient with dementia and oriented to self only at baseline. Today he is oriented to full name at baseline.  - Delirium precautions    #HTN Patient has a history of HTN and per rehab facility records has been taking Losartan 50 mg BID. Last dose was given yesterday evening. He had low-normal BP on admission. Will determine if he needs BP medication upon discharge. Today improving BP, though will continue to hold and monitor hemodynamics.  - Hold home Losartan 50 mg BID    #Prior CVA (2013, 2021) Patient has a history of CVA in 2013 and 2021. Unclear if there were any residual deficits. Called [facility] though RN who works with patient was not available so spoke with diplomatic services operational officer. He  takes Aspirin  81 mg daily and Atorvastatin  10 mg daily, he last took aspirin  10/28 AM and Atorvastatin  on 10/27 PM. Given concern for GI bleeding, decreased Hgb and history of diverticulitis, will hold aspirin  for now. Atorvastatin  10 mg is not high intensity, so may consider discontinuing this upon discharge.  - Hold Aspirin  81 mg daily  - Hold Atorvastatin  10  mg daily    #CKD3a vs. CKD3b #AKI on CKD, resolved  Patient has a history of CKD3a, with most recent Cr of ~1.5 - 1.6 in 2024 and eGFR at that time ~47. This admission with Cr of 1.6, though eGFR of 40 which puts him closer to CKD3b if this is his new normal. Cr this admission so far is at his baseline. He did have an AKI on CKD with peak Cr of 2.01. This was trended with BMP and showed improvement to 1.62 today.  - f/u BMP    #Thrombocytopenia Patient has a history of thrombocytopenia, baseline appears to be 120-150s. Platelets on admission of 149. Today platelets 103. Will continue to monitor.  - CBC  Best Practice: Diet: NPO IVF: Fluids: LR, Rate: 125 cc/hr x 10 hrs VTE: SCDs Start: 01/08/24 1527 Code: DNR - Limited  Disposition planning: Family Contact: Granddaughter [name], to be notified. DISPO: Anticipated discharge in 1-2 days to Rehab pending medical stabilization.  Signature:  Doyal Miyamoto, MD [facility]  7:24 AM, 01/11/2024  On Call pager [phone]

## 2024-01-11 NOTE — TOC Initial Note (Signed)
 Transition of Care [facility]) - Initial/Assessment Note    Patient Details  Name: [name] MRN: [id number] Date of Birth: 1925/03/08  Transition of Care [facility]) CM/SW Contact:    Almarie CHRISTELLA Goodie, LCSW Phone Number: 01/11/2024, 2:18 PM  Clinical Narrative:    CSW spoke with granddaughter, [name], to confirm plan to return ot [facility] when medically stable. Per MD, possibility over the weekend. CSW provided update to [facility], they will have a bed whenever patient is stable. CSW to follow.               Expected Discharge Plan: Skilled Nursing Facility Barriers to Discharge: Continued Medical Work up   Patient Goals and CMS Choice Patient states their goals for this hospitalization and ongoing recovery are:: patient unable to participate in goal setting, not fully oriented CMS Medicare.gov Compare Post Acute Care list provided to:: Patient Represenative (must comment) Choice offered to / list presented to : [facility] POA / Guardian [place] ownership interest in [facility].provided to:: [facility] POA / Guardian    Expected Discharge Plan and Services     Post Acute Care Choice: Skilled Nursing Facility Living arrangements for the past 2 months: Skilled Nursing Facility                                      Prior Living Arrangements/Services Living arrangements for the past 2 months: Skilled Nursing Facility Lives with:: Facility Resident Patient language and need for interpreter reviewed:: No Do you feel safe going back to the place where you live?: Yes      Need for Family Participation in Patient Care: Yes (Comment) Care giver support system in place?: Yes (comment)   Criminal Activity/Legal Involvement Pertinent to Current Situation/Hospitalization: No - Comment as needed  Activities of Daily Living      Permission Sought/Granted Permission sought to share information with : Facility Medical Sales Representative, Family Supports Permission granted to share information  with : Yes, Verbal Permission Granted  Share Information with NAME: [name]  Permission granted to share info w AGENCY: [facility]  Permission granted to share info w Relationship: Granddaughter/HCPOA     Emotional Assessment   Attitude/Demeanor/Rapport: Unable to Assess Affect (typically observed): Unable to Assess Orientation: : Oriented to Self Alcohol / Substance Use: Not Applicable Psych Involvement: No (comment)  Admission diagnosis:  Rectal bleeding [K62.5] Upper GI bleed [K92.2] GI bleed [K92.2] Patient Active Problem List   Diagnosis Date Noted   GI bleed 01/09/2024   Aspiration pneumonia of both lungs due to vomit (HCC) 01/09/2024   Upper GI bleed 01/08/2024   Syncope 07/29/2022   Episode of unresponsiveness 07/29/2022   AKI (acute kidney injury) 07/29/2022   Pressure injury of skin 07/29/2022   Lung field abnormal finding on examination 05/31/2022   Irritable 05/12/2021   COVID-19 04/26/2021   Aortic aneurysm 04/26/2021   Bilateral adrenal adenomas 04/26/2021   Cholelithiasis 04/26/2021   Nocturia 01/14/2021   Diverticulosis of colon 07/19/2020   Thrombocytopenia 03/30/2020   Partial symptomatic epilepsy with complex partial seizures, not intractable, without status epilepticus (HCC) 04/17/2019   Rectal bleeding 08/21/2018   Chronic diastolic (congestive) heart failure (HCC) 10/28/2017   Cerebellar ataxia in diseases classified elsewhere (HCC) 02/13/2017   Vascular dementia (HCC) 01/28/2016   Presence of IVC filter 07/17/2014   CKD (chronic kidney disease) stage 3, GFR 30-59 ml/min (HCC) 06/23/2013   Hyperlipidemia  BPH (benign prostatic hyperplasia) 04/24/2013   Cerumen impaction 08/30/2012   Hearing loss 07/20/2011   Meningioma (HCC) 07/20/2011   HTN (hypertension) 07/19/2011   History of CVA (cerebrovascular accident) 07/19/2011   PCP:  Rosan Dayton BROCKS, DO Pharmacy:   [facility] [phone] [place],  - [address] AT [facility] [address] [facility] [address]  [address] [place] [address] Phone: [phone] Fax: [phone]  [facility] - [place], [place] - [address] [place] [address] Phone: [phone] Fax: [phone]  [facility] [address] [place] [address] Phone: [phone] Fax: [phone]     Social Drivers of Health (SDOH) Social History: SDOH Screenings   Food Insecurity: Food Insecurity Present (05/30/2022)  Housing: Low Risk  (05/30/2022)  Transportation Needs: No Transportation Needs (05/30/2022)  Utilities: Not At Risk (05/30/2022)  Alcohol Screen: Low Risk  (05/30/2022)  Depression (PHQ2-9): Low Risk  (05/30/2022)  Financial Resource Strain: Low Risk  (05/30/2022)  Physical Activity: Inactive (05/30/2022)  Social Connections: Socially Isolated (05/30/2022)  Stress: No Stress Concern Present (05/30/2022)  Tobacco Use: Low Risk  (07/29/2022)   SDOH Interventions:     Readmission Risk Interventions     No data to display

## 2024-01-12 DIAGNOSIS — K922 Gastrointestinal hemorrhage, unspecified: Secondary | ICD-10-CM | POA: Diagnosis not present

## 2024-01-12 DIAGNOSIS — N1831 Chronic kidney disease, stage 3a: Secondary | ICD-10-CM | POA: Diagnosis not present

## 2024-01-12 DIAGNOSIS — F0390 Unspecified dementia without behavioral disturbance: Secondary | ICD-10-CM | POA: Diagnosis not present

## 2024-01-12 DIAGNOSIS — I129 Hypertensive chronic kidney disease with stage 1 through stage 4 chronic kidney disease, or unspecified chronic kidney disease: Secondary | ICD-10-CM | POA: Diagnosis not present

## 2024-01-12 LAB — CBC
HCT: 23.6 % — ABNORMAL LOW (ref 39.0–52.0)
Hemoglobin: 7.7 g/dL — ABNORMAL LOW (ref 13.0–17.0)
MCH: 30.7 pg (ref 26.0–34.0)
MCHC: 32.6 g/dL (ref 30.0–36.0)
MCV: 94 fL (ref 80.0–100.0)
Platelets: 112 K/uL — ABNORMAL LOW (ref 150–400)
RBC: 2.51 MIL/uL — ABNORMAL LOW (ref 4.22–5.81)
RDW: 14.6 % (ref 11.5–15.5)
WBC: 10.5 K/uL (ref 4.0–10.5)
nRBC: 0.2 % (ref 0.0–0.2)

## 2024-01-12 LAB — BASIC METABOLIC PANEL WITH GFR
Anion gap: 10 (ref 5–15)
BUN: 28 mg/dL — ABNORMAL HIGH (ref 8–23)
CO2: 23 mmol/L (ref 22–32)
Calcium: 9.4 mg/dL (ref 8.9–10.3)
Chloride: 110 mmol/L (ref 98–111)
Creatinine, Ser: 1.36 mg/dL — ABNORMAL HIGH (ref 0.61–1.24)
GFR, Estimated: 47 mL/min — ABNORMAL LOW (ref >60)
Glucose, Bld: 128 mg/dL — ABNORMAL HIGH (ref 70–99)
Potassium: 3.4 mmol/L — ABNORMAL LOW (ref 3.5–5.1)
Sodium: 143 mmol/L (ref 135–145)

## 2024-01-12 MED ORDER — POTASSIUM CHLORIDE 10 MEQ/100ML IV SOLN
10.0000 meq | INTRAVENOUS | Status: AC
  Administered 2024-01-12 (×4): 10 meq via INTRAVENOUS
  Filled 2024-01-12 (×4): qty 100

## 2024-01-12 NOTE — Progress Notes (Signed)
 HD#3 SUBJECTIVE:  Patient Summary: [name] is a 89 y.o. person living with a history of history of dementia, diverticulitis, CKD3a, HTN, HLD, CVA (2013, 2021), DVT (2015), BPH s/p TURP (2015), thrombocytopenia, who presented with melena and is admitted for medical management of GI bleed.   Overnight Events:  None  Interim History:  Sitting in bed comfortably this morning. Oriented to his name at baseline. One BM yesterday documented as Type 6 clay/green with no evidence of blood. This morning in the room he was on 3.5L [place].    OBJECTIVE:  Vital Signs: Vitals:   01/11/24 1942 01/11/24 2350 01/12/24 0347 01/12/24 0715  BP: (!) 147/91 137/82  (!) 149/89  Pulse: 97 (!) 102 96 (!) 102  Resp: 20 18  17   Temp: 99.4 F (37.4 C) 98.6 F (37 C) (!) 97.5 F (36.4 C) 98.4 F (36.9 C)  TempSrc: Oral  Oral Oral  SpO2: 97% 95% 99% 100%  Weight:      Height:       Supplemental O2: Nasal Cannula SpO2: 100 % O2 Flow Rate (L/min): 5 L/min  Filed Weights   01/08/24 1223  Weight: 85 kg     Intake/Output Summary (Last 24 hours) at 01/12/2024 9191 Last data filed at 01/12/2024 9347 Gross per 24 hour  Intake 1360 ml  Output 1650 ml  Net -290 ml   Net IO Since Admission: 3,536.21 mL [01/12/24 0808]  Physical Exam:  Constitutional: tired-appearing male lying in hospital bed, in no acute distress HEENT: normocephalic atraumatic, mucous membranes moist Cardiovascular: regular rate and rhythm, bilateral radial pulses 2+ Pulmonary/Chest: normal work of breathing on 3.5L O2, lungs clear to auscultation anterior bilaterally Abdominal: soft, non-distended MSK: normal bulk and tone, SCDs in place on bilateral LE Neurological: alert and oriented at baseline to his name Skin: warm and dry Psych: mood calm, behavior normal   Patient Lines/Drains/Airways Status     Active Line/Drains/Airways     Name Placement date Placement time Site Days   Peripheral IV 01/08/24 20 G Right  Antecubital 01/08/24  1145  Antecubital  1   Peripheral IV 01/09/24 22 G 1.75 Left;Upper Arm 01/09/24  0110  Arm  less than 1   Peripheral IV 01/09/24 20 G 2.5 Anterior;Left Forearm 01/09/24  0130  Forearm  less than 1   Wound 01/09/24 0500 Other (Comment) Coccyx Mid;Upper Stage 2 -  Partial thickness loss of dermis presenting as a shallow open injury with a red, pink wound bed without slough. 01/09/24  0500  Coccyx  less than 1            Pertinent labs and imaging:      Latest Ref Rng & Units 01/12/2024    5:19 AM 01/11/2024    8:07 AM 01/11/2024    1:50 AM  CBC  WBC 4.0 - 10.5 K/uL 10.5  12.3  12.4   Hemoglobin 13.0 - 17.0 g/dL 7.7  7.8  8.0   Hematocrit 39.0 - 52.0 % 23.6  24.3  24.9   Platelets 150 - 400 K/uL 112  119  103        Latest Ref Rng & Units 01/12/2024    5:19 AM 01/11/2024    1:50 AM 01/10/2024    6:03 AM  CMP  Glucose 70 - 99 mg/dL 871  867  886   BUN 8 - 23 mg/dL 28  46  54   Creatinine 0.61 - 1.24 mg/dL 8.63  8.37  7.98  Sodium 135 - 145 mmol/L 143  143  144   Potassium 3.5 - 5.1 mmol/L 3.4  3.3  4.1   Chloride 98 - 111 mmol/L 110  109  107   CO2 22 - 32 mmol/L 23  22  25    Calcium  8.9 - 10.3 mg/dL 9.4  9.5  9.4     No results found.   ASSESSMENT/PLAN:  Assessment: Principal Problem:   Upper GI bleed Active Problems:   HTN (hypertension)   History of CVA (cerebrovascular accident)   CKD (chronic kidney disease) stage 3, GFR 30-59 ml/min (HCC)   Hyperlipidemia   Chronic diastolic (congestive) heart failure (HCC)   Rectal bleeding   GI bleed   Aspiration pneumonia of both lungs due to vomit (HCC)   Plan: #Melena #Coffee-ground emesis #Anemia #Hypotension, resolved  #Hypoxemia, improving  #Leukocytosis, resolved  Patient presented with melanotic stools that began a day before his admission from his rehab facility. This admission thus far was given 1 unit PRBC and IVF bolus due to hypotension following aspiration event after patient  vomited. Trended CBC q8 hrs 9.2 --> 8.6 --> 7.6 --> 7.7 --> 8.0 --> 7.8 --> 7.7. He is being managed conservatively with IVF and IV PPI, transfusion as needed. IV Unasyn  on board for aspiration pneumonia coverage. Today improvement of blood pressure and stable  Hgb 7.7. Leukocytosis also resolved, 10.5 down from 19.9. GI was consulted, appreciate assistance, they have no signed off and we will re-engage if needed. He was resumed on full liquid diet on 10/31 after confirmation from GI for no planned scope. In the last 24 hours he has not had any more bloody stools, most recent stool green/clay Type 6. He is on 5L [place] currently. Anticipate that we will finish out IV antibiotics and IV PPI course, with continued close monitoring of stools and vitals, and likely discharge on Monday back to [facility].  GI Recommendations:  - Continue supportive care: IV Protonix  40 mg BID while in hospital, then transition to PO BID PPI for 8 weeks, followed by once daily PPI indefinitely - Avoid all NSAIDs indefinitely - only recommend EGD to perform hemostasis in setting of significant, ongoing bleeding  - s/p LR @ 125 mL/hr for 10 hrs - close monitoring of BM and signs of overt bleeding - IV Protonix  40 mg BID while in hospital, then transition to PO BID PPI for 8 weeks, followed by once daily PPI indefinitely - Avoid all NSAIDs indefinitely - IV Zofran  4 mg q6 hrs prn  - f/u CBC  - if Hgb < 7 or hypotensive, transfuse PRBCs - IV Unasyn  per pharm dosing for aspiration pneumonia coverage (i10/29 - ) for 5 days unless worsening clinically or increased O2 requirement  - 11/1: replenished Kcl: given IV KCl 40 mEq - Full liquid diet    #Dementia Patient with dementia and oriented to self only at baseline. Today he is oriented to name at baseline.  - Delirium precautions    #HTN Patient has a history of HTN and per rehab facility records has been taking Losartan 50 mg BID. Last dose was given 10/27 evening before admission.  He had low-normal BP on admission. Will determine if he needs BP medication upon discharge. Today improving BP, though will continue to hold and monitor hemodynamics.  - Hold home Losartan 50 mg BID    #Prior CVA (2013, 2021) Patient has a history of CVA in 2013 and 2021. Unclear if there were any residual deficits. He  takes Aspirin  81 mg daily and Atorvastatin  10 mg daily, he last took aspirin  10/28 AM and Atorvastatin  on 10/27 PM. Given concern for GI bleeding, decreased Hgb and history of diverticulitis, will hold aspirin  for now. Atorvastatin  10 mg is not high intensity, so may consider discontinuing this upon discharge.  - Hold Aspirin  81 mg daily  - Hold Atorvastatin  10 mg daily    #CKD3a vs. CKD3b #AKI on CKD, resolved  Patient has a history of CKD3a, with most recent Cr of ~1.5 - 1.6 in 2024 and eGFR at that time ~47. This admission with Cr of 1.6, though eGFR of 40 which puts him closer to CKD3b if this is his new normal. Cr this admission so far is at his baseline. He did have an AKI on CKD with peak Cr of 2.01. This was trended with BMP and showed improvement to 1.36 today.  - f/u BMP    #Thrombocytopenia Patient has a history of thrombocytopenia, baseline appears to be 120-150s. Platelets on admission of 149. Today platelets 112. Will continue to monitor.  - CBC  Best Practice: Diet: Full liquid diet  VTE: SCDs Start: 01/08/24 1527 Code: DNR - Limited  Disposition planning: Family Contact: Granddaughter [name], to be notified. DISPO: Anticipated discharge in 2 days to [facility] pending IV antibiotics and medical stabilization.  Signature:  Doyal Miyamoto, MD [facility]  8:08 AM, 01/12/2024  On Call pager [phone]

## 2024-01-12 NOTE — Progress Notes (Signed)
 Speech Language Pathology Treatment: Dysphagia  Patient Details Name: [name] MRN: [id number] DOB: 22-May-1924 Today's Date: 01/12/2024 Time: 9095-9087 SLP Time Calculation (min) (ACUTE ONLY): 8 min  Assessment / Plan / Recommendation Clinical Impression  Delayed coughing was observed after sips of thin liquids and purees today, though this was also observed prior to PO intake. Oral transit is functional and he achieves complete clearance with purees but deferred evaluating with solids since he was only advanced to a full liquid diet this AM. Continue current diet with upgrade to regular solids once medically able. SLP will f/u at least briefly, though do not anticipate post-acute dysphagia needs.    HPI HPI: 89 year old male with advanced dementia admitted from care facility with several days of dark stools, with hemoglobin of 9 down from previous baseline of 14 last May.  He was hemodynamically stable on arrival to the ED, but experienced an aspiration event late last night with coffee-ground emesis, and has had problems with hypotension and hypoxemia since then. CXR following aspiration event showing increased bilateral opacities. Patient seen by SLP at bedside 07/31/22 following and unreponsive episode after vomitting as well, found to have a normal oropharyngeal swallow. Seen also in 2020 at bedside and placed on a dysphagia 1, thin liquid diet at that time. No completion of instrumental study found in chart.      SLP Plan  Continue with current plan of care          Recommendations  Diet recommendations: Regular;Thin liquid Liquids provided via: Cup;Straw Medication Administration: Whole meds with liquid Supervision: Staff to assist with self feeding;Full supervision/cueing for compensatory strategies Compensations: Minimize environmental distractions;Slow rate;Small sips/bites Postural Changes and/or Swallow Maneuvers: Seated upright 90 degrees                  Oral  care BID   PRN Dysphagia, unspecified (R13.10)     Continue with current plan of care     Damien Blumenthal, M.A., CCC-SLP Speech Language Pathology, Acute Rehabilitation Services  Secure Chat preferred [phone]   01/12/2024, 9:28 AM

## 2024-01-12 NOTE — Plan of Care (Signed)
  Problem: Clinical Measurements: Goal: Respiratory complications will improve Outcome: Progressing   Problem: Nutrition: Goal: Adequate nutrition will be maintained Outcome: Progressing   Problem: Elimination: Goal: Will not experience complications related to bowel motility Outcome: Progressing

## 2024-01-13 DIAGNOSIS — I129 Hypertensive chronic kidney disease with stage 1 through stage 4 chronic kidney disease, or unspecified chronic kidney disease: Secondary | ICD-10-CM | POA: Diagnosis not present

## 2024-01-13 DIAGNOSIS — N1831 Chronic kidney disease, stage 3a: Secondary | ICD-10-CM | POA: Diagnosis not present

## 2024-01-13 DIAGNOSIS — F0390 Unspecified dementia without behavioral disturbance: Secondary | ICD-10-CM | POA: Diagnosis not present

## 2024-01-13 DIAGNOSIS — K922 Gastrointestinal hemorrhage, unspecified: Secondary | ICD-10-CM | POA: Diagnosis not present

## 2024-01-13 LAB — BASIC METABOLIC PANEL WITH GFR
Anion gap: 6 (ref 5–15)
BUN: 22 mg/dL (ref 8–23)
CO2: 23 mmol/L (ref 22–32)
Calcium: 9.2 mg/dL (ref 8.9–10.3)
Chloride: 110 mmol/L (ref 98–111)
Creatinine, Ser: 1.17 mg/dL (ref 0.61–1.24)
GFR, Estimated: 57 mL/min — ABNORMAL LOW (ref >60)
Glucose, Bld: 137 mg/dL — ABNORMAL HIGH (ref 70–99)
Potassium: 3.3 mmol/L — ABNORMAL LOW (ref 3.5–5.1)
Sodium: 139 mmol/L (ref 135–145)

## 2024-01-13 LAB — CBC
HCT: 23 % — ABNORMAL LOW (ref 39.0–52.0)
Hemoglobin: 7.3 g/dL — ABNORMAL LOW (ref 13.0–17.0)
MCH: 30.2 pg (ref 26.0–34.0)
MCHC: 31.7 g/dL (ref 30.0–36.0)
MCV: 95 fL (ref 80.0–100.0)
Platelets: 125 K/uL — ABNORMAL LOW (ref 150–400)
RBC: 2.42 MIL/uL — ABNORMAL LOW (ref 4.22–5.81)
RDW: 14.4 % (ref 11.5–15.5)
WBC: 10.3 K/uL (ref 4.0–10.5)
nRBC: 0 % (ref 0.0–0.2)

## 2024-01-13 MED ORDER — POTASSIUM CHLORIDE 10 MEQ/100ML IV SOLN
10.0000 meq | INTRAVENOUS | Status: AC
  Administered 2024-01-13 (×4): 10 meq via INTRAVENOUS
  Filled 2024-01-13 (×4): qty 100

## 2024-01-13 NOTE — Progress Notes (Signed)
 HD#4 SUBJECTIVE:  Patient Summary: [name] is a 89 y.o. person living with a history of history of dementia, diverticulitis, CKD3a, HTN, HLD, CVA (2013, 2021), DVT (2015), BPH s/p TURP (2015), thrombocytopenia, who presented with melena and is admitted for medical management of GI bleed.   Overnight Events:  None  Interim History:  Patient resting comfortably in the bed. On interview he smiled and said hello to the team. Continues to be more alert than other days. O2 has been weaned to 4L [place]. Blood pressure remains normotensive. He had one Type 7 clay/green BM today, no further episodes of black/bloody BM for the past 2 days.    OBJECTIVE:  Vital Signs: Vitals:   01/12/24 2020 01/12/24 2309 01/13/24 0329 01/13/24 0400  BP: (!) 155/88 125/78  128/78  Pulse: (!) 109 97 98   Resp: 20 18 20    Temp: 98.6 F (37 C) 98.7 F (37.1 C) 99 F (37.2 C)   TempSrc: Oral Oral Oral   SpO2: 97% 94% 92%   Weight:      Height:       Supplemental O2: Nasal Cannula SpO2: 92 % O2 Flow Rate (L/min): 4 L/min  Filed Weights   01/08/24 1223  Weight: 85 kg     Intake/Output Summary (Last 24 hours) at 01/13/2024 9374 Last data filed at 01/13/2024 0422 Gross per 24 hour  Intake 777 ml  Output 1700 ml  Net -923 ml   Net IO Since Admission: 3,113.21 mL [01/13/24 0625]  Physical Exam:  Constitutional: alert-appearing male lying in hospital bed, in no acute distress HEENT: normocephalic atraumatic, mucous membranes moist Cardiovascular: regular rate and rhythm, bilateral radial pulses 2+ Pulmonary/Chest: normal work of breathing on 4L O2, lungs clear to auscultation anterior bilaterally, rhonchorous sounds bilateral bases  Abdominal: soft, non-distended MSK: normal bulk and tone, SCDs in place on bilateral LE Neurological: alert and oriented at baseline to his name Skin: warm and dry Psych: mood calm, behavior normal   Patient Lines/Drains/Airways Status     Active  Line/Drains/Airways     Name Placement date Placement time Site Days   Peripheral IV 01/08/24 20 G Right Antecubital 01/08/24  1145  Antecubital  1   Peripheral IV 01/09/24 22 G 1.75 Left;Upper Arm 01/09/24  0110  Arm  less than 1   Peripheral IV 01/09/24 20 G 2.5 Anterior;Left Forearm 01/09/24  0130  Forearm  less than 1   Wound 01/09/24 0500 Other (Comment) Coccyx Mid;Upper Stage 2 -  Partial thickness loss of dermis presenting as a shallow open injury with a red, pink wound bed without slough. 01/09/24  0500  Coccyx  less than 1            Pertinent labs and imaging:      Latest Ref Rng & Units 01/13/2024    1:40 AM 01/12/2024    5:19 AM 01/11/2024    8:07 AM  CBC  WBC 4.0 - 10.5 K/uL 10.3  10.5  12.3   Hemoglobin 13.0 - 17.0 g/dL 7.3  7.7  7.8   Hematocrit 39.0 - 52.0 % 23.0  23.6  24.3   Platelets 150 - 400 K/uL 125  112  119        Latest Ref Rng & Units 01/13/2024    1:40 AM 01/12/2024    5:19 AM 01/11/2024    1:50 AM  CMP  Glucose 70 - 99 mg/dL 862  871  867   BUN 8 - 23  mg/dL 22  28  46   Creatinine 0.61 - 1.24 mg/dL 8.82  8.63  8.37   Sodium 135 - 145 mmol/L 139  143  143   Potassium 3.5 - 5.1 mmol/L 3.3  3.4  3.3   Chloride 98 - 111 mmol/L 110  110  109   CO2 22 - 32 mmol/L 23  23  22    Calcium  8.9 - 10.3 mg/dL 9.2  9.4  9.5     No results found.   ASSESSMENT/PLAN:  Assessment: Principal Problem:   Upper GI bleed Active Problems:   HTN (hypertension)   History of CVA (cerebrovascular accident)   CKD (chronic kidney disease) stage 3, GFR 30-59 ml/min (HCC)   Hyperlipidemia   Chronic diastolic (congestive) heart failure (HCC)   Rectal bleeding   GI bleed   Aspiration pneumonia of both lungs due to vomit (HCC)   Plan: #Melena #Coffee-ground emesis #Anemia #Hypotension, resolved  #Hypoxemia, improving  #Leukocytosis, resolved  Patient presented with melanotic stools that began a day before his admission from his rehab facility. This admission  thus far was given 1 unit PRBC and IVF bolus due to hypotension following aspiration event after patient vomited. Trended CBC q8 hrs 9.2 --> 8.6 --> 7.6 --> 7.7 --> 8.0 --> 7.8 --> 7.7 --> 7.3. He is being managed conservatively with IVF and IV PPI, transfusion as needed. IV Unasyn  on board for aspiration pneumonia coverage. Today improvement of blood pressure and stable  Hgb 7.3, though  in the last 48 hours he has not had any more bloody stools, most recent stool green/clay Type 7. Leukocytosis also resolved, 10.3 down from 19.9. GI was consulted, appreciate assistance, they have no signed off and we will re-engage if needed. He was resumed on full liquid diet on 10/31 after confirmation from GI for no planned scope. He is on 4L [place] currently. Plan to finish out IV antibiotics today and IV PPI course, with continued close monitoring of stools and vitals, and likely discharge on Monday back to [place].  GI Recommendations:  - Continue supportive care: IV Protonix  40 mg BID while in hospital, then transition to PO BID PPI for 8 weeks, followed by once daily PPI indefinitely - Avoid all NSAIDs indefinitely - only recommend EGD to perform hemostasis in setting of significant, ongoing bleeding  - s/p LR @ 125 mL/hr for 10 hrs - close monitoring of BM and signs of overt bleeding - IV Protonix  40 mg BID while in hospital, then transition to PO BID PPI for 8 weeks, followed by once daily PPI indefinitely - Avoid all NSAIDs indefinitely - IV Zofran  4 mg q6 hrs prn  - f/u CBC  - if Hgb < 7 or hypotensive, transfuse PRBCs - IV Unasyn  per pharm dosing for aspiration pneumonia coverage (i10/29 - d11/2) for 5 days  - 11/2: replenished KCl: given IV KCl 40 mEq - Full liquid diet  - SLP following, appreciate assistance    #Dementia Patient with dementia and oriented to self only at baseline. Today he is oriented to name at baseline.  - Delirium precautions    #HTN Patient has a history of HTN and per rehab  facility records has been taking Losartan 50 mg BID. Last dose was given 10/27 evening before admission. He had low-normal BP on admission. Will determine if he needs BP medication upon discharge. Today improving BP, though will continue to hold and monitor hemodynamics.  - Hold home Losartan 50 mg BID    #  Prior CVA (2013, 2021) Patient has a history of CVA in 2013 and 2021. Unclear if there were any residual deficits. He takes Aspirin  81 mg daily and Atorvastatin  10 mg daily, he last took aspirin  10/28 AM and Atorvastatin  on 10/27 PM. Given concern for GI bleeding, decreased Hgb and history of diverticulitis, will hold aspirin  and likely discontinue this upon discharge. Atorvastatin  10 mg is not high intensity, so likely plan to discontinue this upon discharge.  - Hold Aspirin  81 mg daily  - Hold Atorvastatin  10 mg daily    #CKD3a vs. CKD3b #AKI on CKD, resolved  Patient has a history of CKD3a, with most recent Cr of ~1.5 - 1.6 in 2024 and eGFR at that time ~47. This admission with Cr of 1.6, though eGFR of 40 which puts him closer to CKD3b if this is his new normal, however today improvement to 1.17. He did have an AKI on CKD with peak Cr of 2.01. This was trended with BMP and showed improvement to 1.17 today, eGFR 57.   - f/u BMP    #Thrombocytopenia Patient has a history of thrombocytopenia, baseline appears to be 120-150s. Platelets on admission of 149. Today platelets 125. Will continue to monitor.  - CBC  Best Practice: Diet: Full liquid diet  VTE: SCDs Start: 01/08/24 1527 Code: DNR - Limited  Disposition planning: Family Contact: Granddaughter [name], to be notified. DISPO: Anticipated discharge tomorrow to [facility] pending IV antibiotics and medical stabilization.  Signature:  Doyal Miyamoto, MD [facility]  6:25 AM, 01/13/2024  On Call pager [phone]

## 2024-01-14 ENCOUNTER — Inpatient Hospital Stay (HOSPITAL_COMMUNITY)

## 2024-01-14 DIAGNOSIS — K625 Hemorrhage of anus and rectum: Secondary | ICD-10-CM | POA: Diagnosis not present

## 2024-01-14 DIAGNOSIS — J690 Pneumonitis due to inhalation of food and vomit: Secondary | ICD-10-CM | POA: Diagnosis not present

## 2024-01-14 LAB — BASIC METABOLIC PANEL WITH GFR
Anion gap: 8 (ref 5–15)
BUN: 21 mg/dL (ref 8–23)
CO2: 24 mmol/L (ref 22–32)
Calcium: 9.4 mg/dL (ref 8.9–10.3)
Chloride: 111 mmol/L (ref 98–111)
Creatinine, Ser: 1.25 mg/dL — ABNORMAL HIGH (ref 0.61–1.24)
GFR, Estimated: 52 mL/min — ABNORMAL LOW (ref >60)
Glucose, Bld: 114 mg/dL — ABNORMAL HIGH (ref 70–99)
Potassium: 3.5 mmol/L (ref 3.5–5.1)
Sodium: 143 mmol/L (ref 135–145)

## 2024-01-14 LAB — CBC
HCT: 22.9 % — ABNORMAL LOW (ref 39.0–52.0)
Hemoglobin: 7.3 g/dL — ABNORMAL LOW (ref 13.0–17.0)
MCH: 30.5 pg (ref 26.0–34.0)
MCHC: 31.9 g/dL (ref 30.0–36.0)
MCV: 95.8 fL (ref 80.0–100.0)
Platelets: 136 K/uL — ABNORMAL LOW (ref 150–400)
RBC: 2.39 MIL/uL — ABNORMAL LOW (ref 4.22–5.81)
RDW: 14.6 % (ref 11.5–15.5)
WBC: 10.8 K/uL — ABNORMAL HIGH (ref 4.0–10.5)
nRBC: 0 % (ref 0.0–0.2)

## 2024-01-14 LAB — GLUCOSE, CAPILLARY: Glucose-Capillary: 136 mg/dL — ABNORMAL HIGH (ref 70–99)

## 2024-01-14 MED ORDER — AMOXICILLIN-POT CLAVULANATE 250-62.5 MG/5ML PO SUSR
500.0000 mg | Freq: Three times a day (TID) | ORAL | Status: DC
  Administered 2024-01-14 – 2024-01-16 (×7): 500 mg via ORAL
  Filled 2024-01-14 (×9): qty 10

## 2024-01-14 MED ORDER — IPRATROPIUM-ALBUTEROL 0.5-2.5 (3) MG/3ML IN SOLN
3.0000 mL | Freq: Four times a day (QID) | RESPIRATORY_TRACT | Status: DC
  Administered 2024-01-14 – 2024-01-16 (×6): 3 mL via RESPIRATORY_TRACT
  Filled 2024-01-14 (×7): qty 3

## 2024-01-14 MED ORDER — ALBUTEROL SULFATE (2.5 MG/3ML) 0.083% IN NEBU: 2.5000 mg | INHALATION_SOLUTION | RESPIRATORY_TRACT | Status: DC | PRN

## 2024-01-14 MED ORDER — IPRATROPIUM-ALBUTEROL 0.5-2.5 (3) MG/3ML IN SOLN
3.0000 mL | Freq: Four times a day (QID) | RESPIRATORY_TRACT | Status: DC
  Administered 2024-01-14: 3 mL via RESPIRATORY_TRACT
  Filled 2024-01-14: qty 3

## 2024-01-14 NOTE — Progress Notes (Signed)
 Speech Language Pathology Treatment: Dysphagia  Patient Details Name: [name] MRN: [id number] DOB: 12/02/1924 Today's Date: 01/14/2024 Time: 8391-8383 SLP Time Calculation (min) (ACUTE ONLY): 8 min  Assessment / Plan / Recommendation Clinical Impression  Increased coughing was noticed today with small sips thin liquids. Oral transit was prompt with pureed solids and he cleared his oral cavity completely. Given increased opacities noted in the RLL with concern for aspiration, discussed plan with pt's granddaughter. She describes oral holding, pocketing, and coughing with meals PTA leading to modifying his diet to purees with thin liquids. We discussed the effects of aspiration and increased risk secondary to baseline cognitive impairment as well as her goals for pt's diet. She states she would like to proceed with an MBS for more information, though is unlikely to make further changes to pt's diet. Pt is otherwise medically cleared for upgraded solids so will advance to Dys 1 with thin liquids and f/u to complete an MBS as scheduling allows.    HPI HPI: 89 year old male with advanced dementia admitted from care facility with several days of dark stools, with hemoglobin of 9 down from previous baseline of 14 last May.  He was hemodynamically stable on arrival to the ED, but experienced an aspiration event late last night with coffee-ground emesis, and has had problems with hypotension and hypoxemia since then. CXR following aspiration event showing increased bilateral opacities. CXR 11/3 with increasing RLL opacities, concerning for pneumonia or aspiration. Patient seen by SLP at bedside 07/31/22 following and unreponsive episode after vomitting as well, found to have a normal oropharyngeal swallow. Seen also in 2020 at bedside and placed on a dysphagia 1, thin liquid diet at that time. No completion of instrumental study found in chart.      SLP Plan  MBS          Recommendations  Diet  recommendations: Dysphagia 1 (puree);Thin liquid Liquids provided via: Cup;Straw Medication Administration: Whole meds with liquid Supervision: Staff to assist with self feeding;Full supervision/cueing for compensatory strategies Compensations: Minimize environmental distractions;Slow rate;Small sips/bites Postural Changes and/or Swallow Maneuvers: Seated upright 90 degrees                  Oral care BID   PRN Dysphagia, unspecified (R13.10)     MBS     Damien Blumenthal, M.A., CCC-SLP Speech Language Pathology, Acute Rehabilitation Services  Secure Chat preferred [phone]   01/14/2024, 5:01 PM

## 2024-01-14 NOTE — Discharge Instructions (Signed)
 To [name]'s caretakers,  They were admitted to [facility] on 01/08/2024 for evaluation and treatment of: black stools     The evaluation suggested likely an upper GI bleed given his black stools. They were treated with conservative medical management including PPI, IV fluids, and close monitoring. He was covered for aspiration pneumonia after an aspiration event on his vomit, with IV antibiotics before transitioning to oral antibiotics.   They were discharged from the hospital on 01/16/24. I recommend the following after leaving the hospital:   Medications Adjusted During Hospital Admission:  1) TAKE Pantoprazole  (Protonix ) 40 mg: take one tablet twice daily for 8 weeks, then beginning 03/14/2023 begin taking Pantoprazole  40 mg one tablet daily indefinitely   2) TAKE Augmentin 500-125 mg q8 hrs beginning at 2200 on 11/5 until finished    Medications stopped during admission:   1) DO NOT TAKE Aspirin   2) DO NOT TAKE Atorvastatin     Medications held during admission: Losartan 50 mg: HOLD home Losartan 50 mg twice daily on discharge  - Would recommend continuing to monitor BP and may resume at a lowered dose if indicated, and titrate up as able    Lasix  20 mg: Patient has a history of LE edema and was previously taking Lasix  20 mg daily. Would recommend that this be given on an as needed basis if he develops lower extremity or upper extremity edema, or gains 3 lbs in one day, or 5-7 lbs in one week.  - Take Lasix  20 mg daily as needed if he develops lower extremity or upper extremity edema, or gains 3 lbs in one day, or 5-7 lbs in one week    AVOID ALL NSAIDS INDEFINITELY   Contact information for after-discharge care     Destination     [facility], [place] .   Service: Skilled Nursing Contact information: [address] [place]  [phone]                      For questions about the care  plan: Call [phone]. Dial [phone] for the operator. Ask for the internal medicine resident on call.  Thank you for allowing us  to be part of your care. I am so glad that you are feeling better!  Doyal Miyamoto, MD 01/16/2024, 11:57 AM

## 2024-01-14 NOTE — Care Management Important Message (Signed)
 Important Message  Patient Details  Name: [name] MRN: [id number] Date of Birth: 07-06-1924   Important Message Given:  Yes - Medicare IM     Claretta Deed 01/14/2024, 4:03 PM

## 2024-01-14 NOTE — Progress Notes (Signed)
 HD#5 SUBJECTIVE:  Patient Summary: [name] is a 89 y.o. person living with a history of history of dementia, diverticulitis, CKD3a, HTN, HLD, CVA (2013, 2021), DVT (2015), BPH s/p TURP (2015), thrombocytopenia, who presented with melena and is admitted for medical management of GI bleed, now being treated for aspiration pneumonia.   Overnight Events:  None  Interim History:  Patient resting comfortably in the bed. On interview he smiled and said hello to the team. Continues to be more alert than other days. O2 has been weaned to 2L [place]. Blood pressure remains normotensive. He had no further episodes of black/bloody BM for the past 2 days. He sounds more wheezy on exam today, but his overall appearance is improved.    OBJECTIVE:  Vital Signs: Vitals:   01/13/24 2021 01/13/24 2320 01/14/24 0422 01/14/24 0712  BP: 138/77 (!) 145/79 119/86 125/72  Pulse: 90 92 100 75  Resp: 20 20 20 15   Temp: 99 F (37.2 C) 98.2 F (36.8 C) 97.8 F (36.6 C) (!) 97.5 F (36.4 C)  TempSrc: Axillary Axillary Oral Oral  SpO2: 96% 99% 94% 95%  Weight:      Height:       Supplemental O2: Nasal Cannula SpO2: 95 % O2 Flow Rate (L/min): 2 L/min  Filed Weights   01/08/24 1223  Weight: 85 kg     Intake/Output Summary (Last 24 hours) at 01/14/2024 0950 Last data filed at 01/14/2024 0900 Gross per 24 hour  Intake 713 ml  Output 1200 ml  Net -487 ml   Net IO Since Admission: 3,066.21 mL [01/14/24 0950]  Physical Exam:  Constitutional: alert-appearing male lying in hospital bed, in no acute distress HEENT: normocephalic atraumatic, mucous membranes moist Cardiovascular: regular rate and rhythm, bilateral radial pulses 2+ Pulmonary/Chest: normal work of breathing on 2L O2, lungs with rhonchorous sounds bilaterally and moderate wheezing  Abdominal: soft, non-distended MSK: normal bulk and tone, SCDs in place on bilateral LE Neurological: alert and oriented at baseline to his name Skin:  warm and dry Psych: mood calm, behavior normal   Patient Lines/Drains/Airways Status     Active Line/Drains/Airways     Name Placement date Placement time Site Days   Peripheral IV 01/08/24 20 G Right Antecubital 01/08/24  1145  Antecubital  1   Peripheral IV 01/09/24 22 G 1.75 Left;Upper Arm 01/09/24  0110  Arm  less than 1   Peripheral IV 01/09/24 20 G 2.5 Anterior;Left Forearm 01/09/24  0130  Forearm  less than 1   Wound 01/09/24 0500 Other (Comment) Coccyx Mid;Upper Stage 2 -  Partial thickness loss of dermis presenting as a shallow open injury with a red, pink wound bed without slough. 01/09/24  0500  Coccyx  less than 1            Pertinent labs and imaging:      Latest Ref Rng & Units 01/14/2024    2:40 AM 01/13/2024    1:40 AM 01/12/2024    5:19 AM  CBC  WBC 4.0 - 10.5 K/uL 10.8  10.3  10.5   Hemoglobin 13.0 - 17.0 g/dL 7.3  7.3  7.7   Hematocrit 39.0 - 52.0 % 22.9  23.0  23.6   Platelets 150 - 400 K/uL 136  125  112        Latest Ref Rng & Units 01/14/2024    2:40 AM 01/13/2024    1:40 AM 01/12/2024    5:19 AM  CMP  Glucose 70 -  99 mg/dL 885  862  871   BUN 8 - 23 mg/dL 21  22  28    Creatinine 0.61 - 1.24 mg/dL 8.74  8.82  8.63   Sodium 135 - 145 mmol/L 143  139  143   Potassium 3.5 - 5.1 mmol/L 3.5  3.3  3.4   Chloride 98 - 111 mmol/L 111  110  110   CO2 22 - 32 mmol/L 24  23  23    Calcium  8.9 - 10.3 mg/dL 9.4  9.2  9.4     No results found.   ASSESSMENT/PLAN:  Assessment: Principal Problem:   Upper GI bleed Active Problems:   HTN (hypertension)   History of CVA (cerebrovascular accident)   CKD (chronic kidney disease) stage 3, GFR 30-59 ml/min (HCC)   Hyperlipidemia   Chronic diastolic (congestive) heart failure (HCC)   Rectal bleeding   GI bleed   Aspiration pneumonia of both lungs due to vomit (HCC)   Plan: #Aspiration Pneumonia #Hypoxemia, improving  #Leukocytosis, improving Patient with increased O2 requirement initially after  aspiration event after choking on his vomit. He was initiated on IV Unasyn  for aspiration pneumonia coverage. Leukocytosis improved from 19.9 to 10.8 today. He was treated with 5 days of IV Unasyn , and given rhonchorous sounding lungs, will extend for 2 additional days with Augmentin. Ordered CXR and awaiting official read.  - f/u CXR  - s/p IV Unasyn  per pharm dosing for aspiration pneumonia coverage (i10/29 - d11/2), now transitioned to Augmentin 500-125 mg q8 hrs for 2 more days (i11/3)   - f/u CBC  #Melena #Coffee-ground emesis #Anemia #Hypotension, resolved  Patient presented with melanotic stools that began a day before his admission from his rehab facility. This admission thus far was given 1 unit PRBC and IVF bolus due to hypotension following aspiration event after patient vomited. Trended CBC q8 hrs 9.2 --> 8.6 --> 7.6 --> 7.7 --> 8.0 --> 7.8 --> 7.7 --> 7.3. He is being managed conservatively with IVF and IV PPI, transfusion as needed. S/p 5 days of IV Unasyn  for aspiration pneumonia coverage and transitioning to oral Augmentin. Today improvement of blood pressure and stable  Hgb 7.3, and in the last 72 hours he has not had any more bloody stools, most recent stool green/clay Type 7. Leukocytosis 10.8 down from 19.9. GI was consulted, appreciate assistance, they have no signed off and we will re-engage if needed. He was resumed on full liquid diet on 10/31 after confirmation from GI for no planned scope. He is on 2L [place] currently.  GI Recommendations:  - Continue supportive care: IV Protonix  40 mg BID while in hospital, then transition to PO BID PPI for 8 weeks, followed by once daily PPI indefinitely - Avoid all NSAIDs indefinitely - only recommend EGD to perform hemostasis in setting of significant, ongoing bleeding  - close monitoring of BM and signs of overt bleeding - IV Protonix  40 mg BID while in hospital, then transition to PO BID PPI for 8 weeks, followed by once daily PPI  indefinitely - Avoid all NSAIDs indefinitely - IV Zofran  4 mg q6 hrs prn  - f/u CBC  - if Hgb < 7 or hypotensive, transfuse PRBCs - s/p IV Unasyn  per pharm dosing for aspiration pneumonia coverage (i10/29 - d11/2), now transitioned to Augmentin 500-125 mg q8 hrs for 2 more days (i11/3)   - Full liquid diet  - SLP following, appreciate assistance    #Dementia Patient with dementia and oriented to self  only at baseline. Today he is oriented to name at baseline.  - Delirium precautions    #HTN Patient has a history of HTN and per rehab facility records has been taking Losartan 50 mg BID. Last dose was given 10/27 evening before admission. Today improving BP, though will continue to hold and monitor hemodynamics. Will determine if he needs BP medication upon discharge.  - Hold home Losartan 50 mg BID    #Prior CVA (2013, 2021) Patient has a history of CVA in 2013 and 2021. Unclear if there were any residual deficits. He takes Aspirin  81 mg daily and Atorvastatin  10 mg daily, he last took aspirin  10/28 AM and Atorvastatin  on 10/27 PM. Given concern for GI bleeding, decreased Hgb and history of diverticulitis, have held aspirin  during admission and plan to discontinue this upon discharge. Atorvastatin  10 mg is not high intensity, and plan to discontinue this upon discharge.  - Stop Aspirin  81 mg daily, plan to discontinue this upon discharge  - Stop Atorvastatin  10 mg daily, plan to discontinue this upon discharge    #CKD3a vs. CKD3b #AKI on CKD, resolved  Patient has a history of CKD3a, with most recent Cr of ~1.5 - 1.6 in 2024 and eGFR at that time ~47.  He did have an AKI on CKD with peak Cr of 2.01. This was trended with BMP and showed improvement to 1.25 today, eGFR 52.   - f/u BMP    #Thrombocytopenia Patient has a history of thrombocytopenia, baseline appears to be 120-150s. Platelets on admission of 149. Today platelets 136. Will continue to monitor.  - CBC  Best Practice: Diet:  Full liquid diet  VTE: SCDs Start: 01/08/24 1527 Code: DNR - Limited  Disposition planning: Family Contact: Granddaughter [name], called and notified. DISPO: Anticipated discharge in 1-2 days to [facility] pending medical stabilization.  Signature:  Doyal Miyamoto, MD [facility]  9:50 AM, 01/14/2024  On Call pager [phone]

## 2024-01-15 ENCOUNTER — Other Ambulatory Visit (HOSPITAL_COMMUNITY): Payer: Self-pay

## 2024-01-15 ENCOUNTER — Inpatient Hospital Stay (HOSPITAL_COMMUNITY)

## 2024-01-15 DIAGNOSIS — J690 Pneumonitis due to inhalation of food and vomit: Secondary | ICD-10-CM | POA: Diagnosis not present

## 2024-01-15 DIAGNOSIS — K625 Hemorrhage of anus and rectum: Secondary | ICD-10-CM | POA: Diagnosis not present

## 2024-01-15 LAB — CBC
HCT: 22.6 % — ABNORMAL LOW (ref 39.0–52.0)
Hemoglobin: 7.2 g/dL — ABNORMAL LOW (ref 13.0–17.0)
MCH: 30.5 pg (ref 26.0–34.0)
MCHC: 31.9 g/dL (ref 30.0–36.0)
MCV: 95.8 fL (ref 80.0–100.0)
Platelets: 173 K/uL (ref 150–400)
RBC: 2.36 MIL/uL — ABNORMAL LOW (ref 4.22–5.81)
RDW: 14.6 % (ref 11.5–15.5)
WBC: 10.4 K/uL (ref 4.0–10.5)
nRBC: 0 % (ref 0.0–0.2)

## 2024-01-15 LAB — BASIC METABOLIC PANEL WITH GFR
Anion gap: 13 (ref 5–15)
BUN: 19 mg/dL (ref 8–23)
CO2: 23 mmol/L (ref 22–32)
Calcium: 9.9 mg/dL (ref 8.9–10.3)
Chloride: 107 mmol/L (ref 98–111)
Creatinine, Ser: 1.23 mg/dL (ref 0.61–1.24)
GFR, Estimated: 53 mL/min — ABNORMAL LOW (ref >60)
Glucose, Bld: 138 mg/dL — ABNORMAL HIGH (ref 70–99)
Potassium: 3.3 mmol/L — ABNORMAL LOW (ref 3.5–5.1)
Sodium: 143 mmol/L (ref 135–145)

## 2024-01-15 MED ORDER — POTASSIUM CHLORIDE 20 MEQ PO PACK
40.0000 meq | PACK | Freq: Once | ORAL | Status: AC
  Administered 2024-01-15: 40 meq via ORAL
  Filled 2024-01-15: qty 2

## 2024-01-15 MED ORDER — AMOXICILLIN-POT CLAVULANATE 250-62.5 MG/5ML PO SUSR
500.0000 mg | Freq: Three times a day (TID) | ORAL | 0 refills | Status: DC
  Filled 2024-01-15: qty 100, 4d supply, fill #0

## 2024-01-15 MED ORDER — PANTOPRAZOLE SODIUM 40 MG PO TBEC
DELAYED_RELEASE_TABLET | ORAL | Status: AC
Start: 2024-01-15 — End: ?

## 2024-01-15 MED ORDER — AMOXICILLIN-POT CLAVULANATE 250-62.5 MG/5ML PO SUSR: 500.0000 mg | Freq: Three times a day (TID) | ORAL | Status: DC

## 2024-01-15 NOTE — Discharge Summary (Shared)
 Name: [name] MRN: [id number] DOB: [birth year] 89 y.o. PCP: Jimmy Dayton BROCKS, DO  Date of Admission: 01/08/2024 10:40 AM Date of Discharge: 01/16/2024 Attending Physician: Dr. MICAEL Riis Winfrey  Discharge Diagnosis: 1. Principal Problem:   Upper GI bleed Active Problems:   HTN (hypertension)   History of CVA (cerebrovascular accident)   CKD (chronic kidney disease) stage 3, GFR 30-59 ml/min (HCC)   Hyperlipidemia   Chronic diastolic (congestive) heart failure (HCC)   Rectal bleeding   GI bleed   Aspiration pneumonia of both lungs due to vomit [facility])   Discharge Medications: Allergies as of 01/16/2024   No Known Allergies      Medication List     PAUSE taking these medications    furosemide  20 MG tablet Wait to take this until your doctor or other care provider tells you to start again. Commonly known as: LASIX  Take 20 mg by mouth daily.   losartan 50 MG tablet Wait to take this until your doctor or other care provider tells you to start again. Commonly known as: COZAAR Take 50 mg by mouth 2 (two) times daily.       STOP taking these medications    aspirin  EC 81 MG tablet   atorvastatin  10 MG tablet Commonly known as: LIPITOR       TAKE these medications    acetaminophen  325 MG tablet Commonly known as: TYLENOL  Take 650 mg by mouth every 6 (six) hours as needed for mild pain (pain score 1-3), moderate pain (pain score 4-6) or fever.   amoxicillin-clavulanate 250-62.5 MG/5ML suspension Commonly known as: AUGMENTIN Take 10 mLs (500 mg total) by mouth every 8 (eight) hours for 7 doses. Starting at 2200 on 11/5   eucerin lotion Apply 1 Application topically as needed for dry skin.   pantoprazole  40 MG tablet Commonly known as: Protonix  Take Protonix  40 mg one tablet twice daily by mouth for 8 weeks. Then, after 8 weeks on 03/13/2024, begin taking Protonix  40 mg one tablet daily indefinitely.         Disposition and follow-up:    [name] was discharged from [facility] in Stable condition.  At the hospital follow up visit please address:  1.  Melena: Monitor for bloody or black stools. He will need to be on PPI as follows:  - Continue Protonix  40 mg PO BID for 8 weeks, followed by Protonix  40 mg once daily indefinitely - Avoid all NSAIDs indefinitely  Aspiration Pneumonia: being treated for aspiration pneumonia for a total of 10 day antibiotic course. MBS before discharge with SLP. Shared decision making with granddaughter [name] to continue with puree diet and thin liquids. He is discharging on home level chronic O2 of 2L via [place].  - Continue Augmentin 500-125 mg q8 hrs beginning at 2200 on 11/5 - Continue with puree with thin liquid diet  Hypertension: Patient remained normotensive after stabilization during admission.  - HOLD home Losartan 50 mg BID on discharge  - Would recommend continuing to monitor BP and may resume at a lowered dose if indicated, and titrate up as able    LE Edema: Patient has a history of LE edema and was previously taking Lasix  20 mg daily. Would recommend that this be given on an as needed basis if he develops lower extremity or upper extremity edema, or gains 3 lbs in one day, or 5-7 lbs in one week.  - Take Lasix  20 mg daily as needed if he  develops lower extremity or upper extremity edema, or gains 3 lbs in one day, or 5-7 lbs in one week  Prior CVA history: Patient was previously taking non-high intensity statin and ASA daily. After shared decision making with granddaughter [name], would recommend discontinuation of these medications on discharge.  - STOP Aspirin  81 mg daily  - STOP Atorvastatin  10 mg daily  Pressure Wound: Coccyx Mid;Upper Stage 2 - Partial thickness loss of dermis presenting as a shallow open injury with a red, pink wound bed without slough. Recommend foam dressing, pressure redistribution, clean and provide skin protectant   Continue to discuss  Goals of Care with family: Please engage Palliative/Hospice discussions at [facility] to assess goals of care  2.  Labs / imaging needed at time of follow-up: CBC (Hgb, WBC), BMP (K, Cr)   3.  Pending labs/ test needing follow-up: None  Follow-up Appointments:  Contact information for after-discharge care     Destination     [facility], [place] .   Service: Skilled Nursing Contact information: [address] [place]  [phone]                      Hospital Course by problem list: PAU BANH is a 89 y.o. person living with a history of history of dementia, diverticulitis, CKD3a, HTN, HLD, CVA (2013, 2021), DVT (2015), BPH s/p TURP (2015), thrombocytopenia, who presented with melena and was admitted for medical management of GI bleed and was treated for aspiration pneumonia, now being discharged on hospital day 7 with the following pertinent hospital course:  #Melena #Coffee-ground emesis  #Anemia #Hypotension, resolved  #Hypoxemia, improved to baseline  Prior to admission, patient had 3 black tarry BM at [facility] 10/27, though did not have vomiting episodes and was acting like his normal self, eating and drinking appropriately with assistance. He is on chronic oxygen  4L [place] supplementation at the rehab facility, though unclear why. Collateral from granddaughter [name] was that she last understood patient's supplemental oxygen  to be at 2L [place], and didn't know that he had been increased to 4L [place] at the facility. Hgb on admission of 9.3, Hct of 30.9. CXR on admission showed no acute process. During initial interview and exam of patient, supplemental O2 was turned off and he remained at SpO2 100% on room air for the next 6 hours. Patient did not appear dyspneic or gasping for air. Difficult to assess ROS given patient is baseline oriented to self only, but on exam he did not have tenderness to palpation of his abdomen. ED MD performed DRE  which had brown stool, no evidence of blood. Reassuringly, vitals were stable on admission. Discussed with patient's granddaughter [name] who is his primary decision maker, who did not wish for invasive procedures including EGD or colonoscopy unless emergent needs. Her primary goal is his comfort and stabilization. He was initiated on IV Protonix  and IVF. Home meds were held. Overnight of admission, he vomited and aspirated his vomit with coffee ground emesis, as well as large amounts of melanotic stool, with issues of hypotension and hypoxemia thereafter. CXR showed increased small scattered perihilar opacities, left greater than right, which may represent atelectasis or pneumonia and repeat CXR continued to show right apical opacity and bibasilar patchy opacities, possibly related to hypoxia. Hgb decreased from 9.3 to 8.4 in roughly 12 hours. Given his instability, he was given 1 unit PRBC and IVF bolus. AM CBC showed Hgb stable at 9.2, with no  further episodes of hematemesis. He had to be placed on 7L HFNC. He was initiated on a 5-day course of Zosyn  for aspiration pneumonia coverage. He had x2 more episodes of Type 7 red/black stools over the next 24 hours. CBC were trended q8 hrs which showed stable Hgb mid 7's, with one lower to 6.9, prompting 1 unit PRBC transfused. He received 2 units PRBC overall this admission. He remained normotensive, O2 sat on 2L [place] home chronic dose, and asymptomatic. He had remained afebrile and hemodynamically stable for 5 days prior to discharge. He did not have any more bloody or melanotic stools for 5 days prior to discharge. He was monitored with continuous cardiac monitoring, q8 CBC, close attention to his vitals, PPI, IVF, and antibiotics.  - repeat CBC to ensure stable and improving Hgb  - Continue Protonix  40 mg PO BID for 8 weeks, followed by Protonix  40 mg once daily indefinitely - Avoid all NSAIDs indefinitely  #Aspiration Pneumonia #Hypoxemia, improving   #Leukocytosis, improving #Aspiration Risk Patient with increased O2 requirement initially after aspiration event after choking on his vomit. He was initiated on IV Unasyn  for aspiration pneumonia coverage. Leukocytosis improved from 19.9 to 10.4 by day of discharge. CXR showed increased patchy RLL opacity c/f pneumonia or aspiration. He was treated with 5 days of IV Unasyn , and given rhonchorous sounding lungs, extended for 10 days total of antibiotic treatment. He received his AM and afternoon Augmentin prior to discharge, and will need to begin his outpatient medications at 2200 on 01/15/2024 to finish out his Augmentin prescription. Patient was evaluated by SLP during admission and he continues to show aspiration risk. With shared decision making with granddaughter [name], recommendation to continue with puree with thin liquid diet. By day of discharge he was oxygenating well on home chronic 2L [place] at 100% with good forehead pleth.  - Continue Augmentin 500-125 mg q8 hrs for 7 doses to finish out abx beginning at 2200 on 11/5 - Continue with puree with thin liquid diet - Recommend goals of care discussion with Palliative/Hospice    #HTN Patient has a history of HTN and per rehab facility records has been taking Losartan 50 mg BID. Last dose outpatient was given evening before admission. He had low-normal BP initially so this was held. Monitored BP over the course of his admission and once he was medically stable, his BP remained normotensive. Would recommend continuing to monitor BP and may resume at a lowered dose if indicated.   - HOLD home Losartan 50 mg BID  - Would recommend continuing to monitor BP and may resume at a lowered dose if indicated.     #Prior CVA (2013, 2021) Patient has a history of CVA in 2013 and 2021. Unclear if there were any residual deficits. Called [facility] facility though RN who works with patient was not available so spoke with diplomatic services operational officer. He takes Aspirin  81 mg daily and  Atorvastatin  10 mg daily, he last took aspirin  10/28 AM and Atorvastatin  on 10/27 PM prior to admission. Given concern for GI bleeding, decreased Hgb and history of diverticulitis, held aspirin  on admission. After GOC discussion with patient's decision maker Granddaughter [name], have decided to discontinue these medications altogether.   - STOP Aspirin  81 mg daily  - STOP Atorvastatin  10 mg daily    #CKD3a Patient has a history of CKD3a, with most recent Cr of ~1.5 - 1.6 in May 2024 and eGFR at that time ~47. This admission with Cr of 1.6, though eGFR  of 40 which puts him closer to CKD3b if this is his new normal. He did have an AKI on CKD with peak Cr of 2.01. This was trended with BMP and showed improvement to 1.23 by day of discharge.    #Thrombocytopenia Patient has a history of thrombocytopenia, baseline appears to be 120-150s. Platelets on admission of 149, they improved to 173 by day of discharge. He was monitored daily with CBC.   #Dementia Patient with advanced dementia and oriented to self only at baseline. He was put on delirium precautions during admission.     Subjective Patient resting comfortably in bed. On interview he smiled and was alert for the team. O2 on 2L [place] at 100% oxygenation with good forehead pleth. Spoke with granddaughter [name] via telephone regarding stable discharge back to [place].   Discharge Exam:   BP 133/76 (BP Location: Right Arm)   Pulse 71   Temp 98.4 F (36.9 C) (Oral)   Resp 19   Ht 5' 8 (1.727 m)   Wt 85 kg   SpO2 93%   BMI 28.49 kg/m   Physical Exam:   Constitutional: alert-appearing male lying in hospital bed, in no acute distress HEENT: normocephalic atraumatic, mucous membranes moist Cardiovascular: regular rate and rhythm, bilateral radial pulses 2+ Pulmonary/Chest: normal work of breathing on 2L O2, lungs with rhonchorous sounds bilaterally and mild wheezes Abdominal: soft, non-distended MSK: normal bulk and tone, SCDs in place on  bilateral LE Neurological: alert and oriented at baseline to his name Skin: warm and dry Psych: mood calm, behavior normal   Pertinent Labs, Studies, and Procedures:     Latest Ref Rng & Units 01/16/2024   12:19 PM 01/16/2024    1:47 AM 01/15/2024    6:27 AM  CBC  WBC 4.0 - 10.5 K/uL  8.9  10.4   Hemoglobin 13.0 - 17.0 g/dL 7.7  6.9  7.2   Hematocrit 39.0 - 52.0 % 24.6  22.0  22.6   Platelets 150 - 400 K/uL  185  173        Latest Ref Rng & Units 01/16/2024    1:47 AM 01/15/2024    6:27 AM 01/14/2024    2:40 AM  CMP  Glucose 70 - 99 mg/dL 811  861  885   BUN 8 - 23 mg/dL 20  19  21    Creatinine 0.61 - 1.24 mg/dL 8.62  8.76  8.74   Sodium 135 - 145 mmol/L 142  143  143   Potassium 3.5 - 5.1 mmol/L 3.7  3.3  3.5   Chloride 98 - 111 mmol/L 110  107  111   CO2 22 - 32 mmol/L 23  23  24    Calcium  8.9 - 10.3 mg/dL 9.6  9.9  9.4     DG CHEST PORT 1 VIEW Result Date: 01/09/2024 EXAM: 1 VIEW(S) XRAY OF THE CHEST 01/09/2024 08:58:15 AM COMPARISON: 01/09/2024 CLINICAL HISTORY: Hypoxia 200808. Reason for exam - hypoxia. FINDINGS: LUNGS AND PLEURA: Low lung volumes. Right apical opacity. Bibasilar patchy opacities. No pulmonary edema. No pleural effusion. No pneumothorax. HEART AND MEDIASTINUM: The cardiac silhouette shows no acute abnormality. The mediastinal silhouette demonstrates a tortuous thoracic aorta. BONES AND SOFT TISSUES: No acute osseous abnormality. IMPRESSION: 1. Right apical opacity and bibasilar patchy opacities, possibly related to hypoxia. 2. Low lung volumes. Electronically signed by: Evalene Coho MD 01/09/2024 09:18 AM EDT RP Workstation: [id number]   DG CHEST PORT 1 VIEW Result Date: 01/09/2024 EXAM: 1 VIEW(S) XRAY  OF THE CHEST 01/09/2024 12:45:30 AM COMPARISON: Portable chest yesterday at 11:32 AM. CLINICAL HISTORY: [id number] Aspiration into airway [id number]. Aspiration into airway Aspiration into airway FINDINGS: LUNGS AND PLEURA: Increased small scattered perihilar  opacities on the left greater than right. In the setting of low lung volumes could represent atelectasis or pneumonia. Remaining lungs are clear. No pulmonary edema. No pleural effusion. No pneumothorax. HEART AND MEDIASTINUM: Mild cardiomegaly. No evidence of CHF. There is a tortuous aorta with atherosclerosis, stable mediastinum. BONES AND SOFT TISSUES: No acute osseous abnormality. IMPRESSION: 1. Increased small scattered perihilar opacities, left greater than right, which may represent atelectasis or pneumonia. low lung volumes. 2. Mild cardiomegaly without evidence of congestive heart failure. 3. Aortic tortuosity with atherosclerosis. Electronically signed by: Francis Quam MD 01/09/2024 12:53 AM EDT RP Workstation: [id number]   DG Chest Port 1 View Result Date: 01/08/2024 EXAM: 1 VIEW(S) XRAY OF THE CHEST 01/08/2024 11:37:00 AM COMPARISON: CTA chest 08/01/2022 chest radiograph 07/29/2022. CLINICAL HISTORY: sob. SOB. Pt BIB [facility] from [facility]. Pt had blood in stool yesterday. Reported it was dark and tarry. Hx of GI bleeds and dementia. FINDINGS: LUNGS AND PLEURA: Low lung volumes. No focal pulmonary opacity. No pulmonary edema. No pleural effusion. No pneumothorax. HEART AND MEDIASTINUM: Aortic atherosclerosis. Tortuosity of the thoracic aorta. No acute abnormality of the cardiac and mediastinal silhouettes. BONES AND SOFT TISSUES: No acute osseous abnormality. IMPRESSION: 1. No acute process. 2. Low lung volumes. Electronically signed by: Donnice Mania MD 01/08/2024 12:25 PM EDT RP Workstation: [id number]     Discharge Instructions: Discharge Instructions     Diet general   Complete by: As directed    Puree and thin liquids diet   Increase activity slowly   Complete by: As directed    No wound care   Complete by: As directed    Coccyx Mid;Upper Stage 2 - Partial thickness loss of dermis presenting as a shallow open injury with a red, pink wound bed without slough. Recommend foam dressing,  pressure redistribution, clean and provide skin protectant         Discharge Instructions      To Mr. Amori Colomb Agosto's caretakers,  They were admitted to [facility] on 01/08/2024 for evaluation and treatment of: black stools     The evaluation suggested likely an upper GI bleed given his black stools. They were treated with conservative medical management including PPI, IV fluids, and close monitoring. He was covered for aspiration pneumonia after an aspiration event on his vomit, with IV antibiotics before transitioning to oral antibiotics.   They were discharged from the hospital on 01/16/24. I recommend the following after leaving the hospital:   Medications Adjusted During Hospital Admission:  1) TAKE Pantoprazole  (Protonix ) 40 mg: take one tablet twice daily for 8 weeks, then beginning 03/14/2023 begin taking Pantoprazole  40 mg one tablet daily indefinitely   2) TAKE Augmentin 500-125 mg q8 hrs beginning at 2200 on 11/5 until finished    Medications stopped during admission:   1) DO NOT TAKE Aspirin   2) DO NOT TAKE Atorvastatin     Medications held during admission: Losartan 50 mg: HOLD home Losartan 50 mg twice daily on discharge  - Would recommend continuing to monitor BP and may resume at a lowered dose if indicated, and titrate up as able    Lasix  20 mg: Patient has a history of LE edema and was previously taking Lasix  20 mg daily. Would recommend that this be given on an as  needed basis if he develops lower extremity or upper extremity edema, or gains 3 lbs in one day, or 5-7 lbs in one week.  - Take Lasix  20 mg daily as needed if he develops lower extremity or upper extremity edema, or gains 3 lbs in one day, or 5-7 lbs in one week    AVOID ALL NSAIDS INDEFINITELY   Contact information for after-discharge care     Destination     [facility], [place] .   Service: Skilled Nursing Contact information: [address] [place]  [phone]                      For questions about the care plan: Call [phone]. Dial [phone] for the operator. Ask for the internal medicine resident on call.  Thank you for allowing us  to be part of your care. I am so glad that you are feeling better!  Doyal Miyamoto, MD 01/16/2024, 11:57 AM        Signed: Doyal Miyamoto, MD 01/16/2024, 2:29 PM

## 2024-01-15 NOTE — Progress Notes (Signed)
 HD#6 SUBJECTIVE:  Patient Summary: [name] is a 89 y.o. person living with a history of history of dementia, diverticulitis, CKD3a, HTN, HLD, CVA (2013, 2021), DVT (2015), BPH s/p TURP (2015), thrombocytopenia, who presented with melena and is admitted for medical management of GI bleed, now being treated for aspiration pneumonia.   Overnight Events:  None  Interim History:  Patient resting comfortably in bed. On interview he smiled and was alert for the team. O2 on 2L [place] at 100% oxygenation with good forehead pleth. No further black or bloody BM.    OBJECTIVE:  Vital Signs: Vitals:   01/15/24 0009 01/15/24 0142 01/15/24 0410 01/15/24 0810  BP: (!) 141/81  133/76 (!) 157/87  Pulse: (!) 101  (!) 106 71  Resp: 18  18 19   Temp: 97.9 F (36.6 C)  97.9 F (36.6 C) 98.4 F (36.9 C)  TempSrc:   Oral Oral  SpO2: 96% 97% 93% 93%  Weight:      Height:       Supplemental O2: Nasal Cannula SpO2: 93 % O2 Flow Rate (L/min): 2 L/min FiO2 (%): 28 %  Filed Weights   01/08/24 1223  Weight: 85 kg     Intake/Output Summary (Last 24 hours) at 01/15/2024 0910 Last data filed at 01/15/2024 0019 Gross per 24 hour  Intake 720 ml  Output 800 ml  Net -80 ml   Net IO Since Admission: 2,986.21 mL [01/15/24 0910]  Physical Exam:  Constitutional: alert-appearing male lying in hospital bed, in no acute distress HEENT: normocephalic atraumatic, mucous membranes moist Cardiovascular: regular rate and rhythm, bilateral radial pulses 2+ Pulmonary/Chest: normal work of breathing on 2L O[place], lungs with rhonchorous sounds bilaterally and moderate wheezing  Abdominal: soft, non-distended MSK: normal bulk and tone, SCDs in place on bilateral LE Neurological: alert and oriented at baseline to his name Skin: warm and dry Psych: mood calm, behavior normal   Patient Lines/Drains/Airways Status     Active Line/Drains/Airways     Name Placement date Placement time Site Days   Peripheral IV  01/08/24 20 G Right Antecubital 01/08/24  1145  Antecubital  1   Peripheral IV 01/09/24 22 G 1.75 Left;Upper Arm 01/09/24  0110  Arm  less than 1   Peripheral IV 01/09/24 20 G 2.5 Anterior;Left Forearm 01/09/24  0130  Forearm  less than 1   Wound 01/09/24 0500 Other (Comment) Coccyx Mid;Upper Stage 2 -  Partial thickness loss of dermis presenting as a shallow open injury with a red, pink wound bed without slough. 01/09/24  0500  Coccyx  less than 1            Pertinent labs and imaging:      Latest Ref Rng & Units 01/15/2024    6:27 AM 01/14/2024    2:40 AM 01/13/2024    1:40 AM  CBC  WBC 4.0 - 10.5 K/uL 10.4  10.8  10.3   Hemoglobin 13.0 - 17.0 g/dL 7.2  7.3  7.3   Hematocrit 39.0 - 52.0 % 22.6  22.9  23.0   Platelets 150 - 400 K/uL 173  136  125        Latest Ref Rng & Units 01/15/2024    6:27 AM 01/14/2024    2:40 AM 01/13/2024    1:40 AM  CMP  Glucose 70 - 99 mg/dL 861  885  862   BUN 8 - 23 mg/dL 19  21  22    Creatinine 0.61 - 1.24  mg/dL 8.76  8.74  8.82   Sodium 135 - 145 mmol/L 143  143  139   Potassium 3.5 - 5.1 mmol/L 3.3  3.5  3.3   Chloride 98 - 111 mmol/L 107  111  110   CO2 22 - 32 mmol/L 23  24  23    Calcium  8.9 - 10.3 mg/dL 9.9  9.4  9.2     No results found.   ASSESSMENT/PLAN:  Assessment: Principal Problem:   Upper GI bleed Active Problems:   HTN (hypertension)   History of CVA (cerebrovascular accident)   CKD (chronic kidney disease) stage 3, GFR 30-59 ml/min (HCC)   Hyperlipidemia   Chronic diastolic (congestive) heart failure (HCC)   Rectal bleeding   GI bleed   Aspiration pneumonia of both lungs due to vomit (HCC)   Plan: #Aspiration Pneumonia #Hypoxemia, at baseline chronic O2 requirement   #Leukocytosis, resolved Patient with increased O2 requirement initially after aspiration event after choking on his vomit. He was initiated on IV Unasyn  for aspiration pneumonia coverage. Leukocytosis improved from 19.9 to 10.4 today. CXR showed  increased patchy RLL opacity c/f pneumonia or aspiration. He was treated with 5 days of IV Unasyn , and given rhonchorous sounding lungs, will extend for 5 additional days with Augmentin for a total of 10 days. He is currently on 2L [place], and was previously on 4L [place] chronically at [facility].  - s/p IV Unasyn  per pharm dosing for aspiration pneumonia coverage (i10/29 - d11/2), now transitioned to Augmentin 500-125 mg q8 hrs for 5 more days (i11/3) for a total of 10 days - f/u CBC - Duoneb q6 hrs prn  #Melena #Coffee-ground emesis #Anemia #Hypotension, resolved  Patient presented with melanotic stools that began a day before his admission from his rehab facility. This admission thus far was given 1 unit PRBC and IVF bolus due to hypotension following aspiration event after patient vomited. Trended CBC q8 hrs then daily 9.2 --> 8.6 --> 7.6 --> 7.7 --> 8.0 --> 7.8 --> 7.7 --> 7.3 --> 7.2. He has been managed conservatively with IVF and IV PPI, transfusion as needed. S/p 5 days of IV Unasyn  for aspiration pneumonia coverage and transitioned to oral Augmentin. Today improvement of blood pressure and stable  Hgb 7.2, and in the last 96 hours he has not had any more bloody stools, most recent stool green/clay Type 7. Leukocytosis 10.4 down from 19.9. GI was consulted, appreciate assistance, they have no signed off and we will re-engage if needed. He was resumed on full liquid diet on 10/31 after confirmation from GI for no planned scope. He is on 2L [place] currently. SLP planning for MBS today to assess outpatient feeding recommendations on discharge.  GI Recommendations:  - Continue supportive care: IV Protonix  40 mg BID while in hospital, then transition to PO BID PPI for 8 weeks, followed by once daily PPI indefinitely - Avoid all NSAIDs indefinitely - only recommend EGD to perform hemostasis in setting of significant, ongoing bleeding  - close monitoring of BM and signs of overt bleeding - IV Protonix  40 mg  BID while in hospital, then transition to PO BID PPI for 8 weeks, followed by once daily PPI indefinitely - Avoid all NSAIDs indefinitely - IV Zofran  4 mg q6 hrs prn  - f/u CBC  - if Hgb < 7 or hypotensive, transfuse PRBCs - s/p IV Unasyn  per pharm dosing for aspiration pneumonia coverage (i10/29 - d11/2), now transitioned to Augmentin 500-125 mg q8 hrs for  5 more days (i11/3) for a total of 10 days - Full liquid diet  - SLP following, appreciate assistance   - MBS today with recommendation of puree and thin liquids diet    #Dementia Patient with dementia and oriented to self only at baseline. Today he is oriented to name at baseline.  - Delirium precautions    #HTN Patient has a history of HTN and per rehab facility records has been taking Losartan 50 mg BID. Last dose was given 10/27 evening before admission. Today improving BP, though will continue to hold and monitor hemodynamics. Will determine if he needs BP medication upon discharge.  - Hold home Losartan 50 mg BID    #Prior CVA (2013, 2021) Patient has a history of CVA in 2013 and 2021. Unclear if there were any residual deficits. He takes Aspirin  81 mg daily and Atorvastatin  10 mg daily, he last took aspirin  10/28 AM and Atorvastatin  on 10/27 PM. Given concern for GI bleeding, decreased Hgb and history of diverticulitis, have held aspirin  during admission and plan to discontinue this upon discharge. Atorvastatin  10 mg is not high intensity, and plan to discontinue this upon discharge.  - Stop Aspirin  81 mg daily, plan to discontinue this upon discharge  - Stop Atorvastatin  10 mg daily, plan to discontinue this upon discharge    #CKD3a vs. CKD3b #AKI on CKD, resolved  Patient has a history of CKD3a, with most recent Cr of ~1.5 - 1.6 in 2024 and eGFR at that time ~47.  He did have an AKI on CKD with peak Cr of 2.01. This was trended with BMP and showed improvement to 1.25 today, eGFR 52.   - f/u BMP    #Thrombocytopenia Patient  has a history of thrombocytopenia, baseline appears to be 120-150s. Platelets on admission of 149. Today platelets 136. Will continue to monitor.  - CBC  Best Practice: Diet: Full liquid diet  VTE: SCDs Start: 01/08/24 1527 Code: DNR - Limited  Disposition planning: Family Contact: Granddaughter [name], called and notified. DISPO: Anticipated discharge tomorrow to [facility] pending [facility] staffing.  Signature:  Doyal Miyamoto, MD [facility]  9:10 AM, 01/15/2024  On Call pager [phone]

## 2024-01-15 NOTE — Progress Notes (Signed)
 RT at bedside to assess patient for nasotracheal suction. Patient has expiratory wheeze and duoneb was given. NTS not indicated at this time.

## 2024-01-15 NOTE — Progress Notes (Signed)
 Modified Barium Swallow Study  Patient Details  Name: [name] MRN: [id number] Date of Birth: 18-Mar-1924  Today's Date: 01/15/2024  Modified Barium Swallow completed.  Full report located under Chart Review in the Imaging Section.  History of Present Illness 89 year old male with advanced dementia admitted from care facility with several days of dark stools, with hemoglobin of 9 down from previous baseline of 14 last May.  He was hemodynamically stable on arrival to the ED, but experienced an aspiration event late last night with coffee-ground emesis, and has had problems with hypotension and hypoxemia since then. CXR following aspiration event showing increased bilateral opacities. Patient seen by SLP at bedside 07/31/22 following and unreponsive episode after vomitting as well, found to have a normal oropharyngeal swallow. Seen also in 2020 at bedside and placed on a dysphagia 1, thin liquid diet at that time. No completion of instrumental study found in chart.   Clinical Impression MBS complete. Overall, patient presents with a mild oropharyngeal dysphagia. Orally, he does present with mildly decreased bolus cohesion with resultant prolonged mastication of regular texture solids and mild oral residuals post swallow but overall function clearance of all boluses provided. His swallow is initiated largely at the pyriform sinuses with liquids consistencies and at the vallecula with solids. Despite a posterior curvature of his cervical spine with suspected osteophytes beginning at C3, base of tongue makes good approximation with posterior pharyngeal wall and epiglottis does deflect, with possible slight obstruction and intermittently decreased laryngeal closure. Given presence of mild deficits noted above and current baseline cognitive deficits, he did inconsistently penetrate both thin and honey thick liquids during today's study. Thin liquids penetrated during the swallow and honey thick liquid  oral residue that pools to the vallecula and eventually the pyriform sinuses post swallow penetrated after the swallow. Unfortunately patient unable to consistently follow directions for a cued cough or throat clear in an effort to clear. He did present with delayed throat clearing and coughing which appeared effective to clear although positioning and posture did not allow for a full and clear view far under the vocal cords to confirm/rule out any other deeper aspiration episodes. Discussed with patient's grandaughter via phone. Based on results of MBS, do not feel that there will be a significant difference in decreasing aspiration risk with thin vs thickened liquids at this time and aspiration of thickened liquids at an increased chance of causing an infection as well as increasing the risk of dehydration. Grandaughter aware of risk of aspiration and would like to continue a puree diet with thin liquids with possible opportunity to advance solids after return to SNF which based on this test is judged appropriate. No further SLP needs indicated as patient with plans to discharge today. Factors that may increase risk of adverse event in presence of aspiration [name] 2021): Poor general health and/or compromised immunity;Reduced cognitive function;Limited mobility  Swallow Evaluation Recommendations Recommendations: PO diet PO Diet Recommendation: Dysphagia 1 (Pureed);Thin liquids (Level 0) Liquid Administration via: Cup;Straw Medication Administration: Crushed with puree Supervision: Staff to assist with self-feeding;Full supervision/cueing for swallowing strategies Swallowing strategies  : Slow rate;Small bites/sips (encourage intermittent throat clearing and/or hard cough if able) Postural changes: Position pt fully upright for meals Oral care recommendations: Oral care BID (2x/day)     Jimmy Smola MA, CCC-SLP  [name] [name] 01/15/2024,11:07 AM

## 2024-01-15 NOTE — TOC Progression Note (Signed)
 Transition of Care [facility]) - Progression Note    Patient Details  Name: [name] MRN: [id number] Date of Birth: [birth year]  [facility] CM/SW Contact  Almarie CHRISTELLA [place], [place] Phone Number: 01/15/2024, 3:09 PM  Clinical Narrative:  CSW contacted by MD about patient returning to SNF today. CSW sent discharge information, and was updated by [facility] that they are not able to accept patient back today with staffing. CSW provided update to medical team, and updated patient's granddaughter. [facility] can accept back tomorrow morning. CSW to follow.     Expected Discharge Plan: Skilled Nursing Facility Barriers to Discharge: Continued Medical Work up               Expected Discharge Plan and Services     Post Acute Care Choice: Skilled Nursing Facility Living arrangements for the past 2 months: Skilled Nursing Facility Expected Discharge Date: 01/15/24                                     Social Drivers of Health (SDOH) Interventions SDOH Screenings   Food Insecurity: Food Insecurity Present (05/30/2022)  Housing: Low Risk  (05/30/2022)  Transportation Needs: No Transportation Needs (05/30/2022)  Utilities: Not At Risk (05/30/2022)  Alcohol Screen: Low Risk  (05/30/2022)  Depression (PHQ2-9): Low Risk  (05/30/2022)  Financial Resource Strain: Low Risk  (05/30/2022)  Physical Activity: Inactive (05/30/2022)  Social Connections: Socially Isolated (05/30/2022)  Stress: No Stress Concern Present (05/30/2022)  Tobacco Use: Low Risk  (07/29/2022)    Readmission Risk Interventions     No data to display

## 2024-01-15 NOTE — Discharge Summary (Deleted)
 Name: [name] MRN: [id number] DOB: 30-Nov-1924 89 y.o. PCP: Rosan Dayton BROCKS, DO  Date of Admission: 01/08/2024 10:40 AM Date of Discharge: 01/15/2024 Attending Physician: Dr. MICAEL Riis Winfrey  Discharge Diagnosis: 1. Principal Problem:   Upper GI bleed Active Problems:   HTN (hypertension)   History of CVA (cerebrovascular accident)   CKD (chronic kidney disease) stage 3, GFR 30-59 ml/min (HCC)   Hyperlipidemia   Chronic diastolic (congestive) heart failure (HCC)   Rectal bleeding   GI bleed   Aspiration pneumonia of both lungs due to vomit [facility])   Discharge Medications: Allergies as of 01/15/2024   No Known Allergies      Medication List     PAUSE taking these medications    furosemide  20 MG tablet Wait to take this until your doctor or other care provider tells you to start again. Commonly known as: LASIX  Take 20 mg by mouth daily.   losartan 50 MG tablet Wait to take this until your doctor or other care provider tells you to start again. Commonly known as: COZAAR Take 50 mg by mouth 2 (two) times daily.       STOP taking these medications    aspirin  EC 81 MG tablet   atorvastatin  10 MG tablet Commonly known as: LIPITOR       TAKE these medications    acetaminophen  325 MG tablet Commonly known as: TYLENOL  Take 650 mg by mouth every 6 (six) hours as needed for mild pain (pain score 1-3), moderate pain (pain score 4-6) or fever.   amoxicillin-clavulanate 250-62.5 MG/5ML suspension Commonly known as: AUGMENTIN Take 10 mLs (500 mg total) by mouth every 8 (eight) hours for 10 doses. Starting at 2200 on 11/4   eucerin lotion Apply 1 Application topically as needed for dry skin.   pantoprazole  40 MG tablet Commonly known as: Protonix  Take Protonix  40 mg one tablet twice daily by mouth for 8 weeks. Then, after 8 weeks on 03/13/2024, begin taking Protonix  40 mg one tablet daily indefinitely.        Disposition and follow-up:   Jimmy  D Arroyo was discharged from [facility] in Stable condition.  At the hospital follow up visit please address:  1.  Melena: Monitor for bloody or black stools. Will need repeat CBC to check Hgb which remained stable > 7 throughout admission. He will need to be on PPI as follows:  - Continue Protonix  40 mg PO BID for 8 weeks, followed by Protonix  40 mg once daily indefinitely - Avoid all NSAIDs indefinitely  Aspiration Pneumonia: being treated for aspiration pneumonia for a total of 10 day antibiotic course. MBS on day of discharge with SLP. Shared decision making with granddaughter [name] to continue with puree diet and thin liquids. He is discharging on home level chronic O2 of 2L via [place].  - Continue Augmentin 500-125 mg q8 hrs for 10 doses beginning at 2200 on 11/4 - Continue with puree with thin liquid diet  Hypertension: Patient remained normotensive after stabilization during admission.  - HOLD home Losartan 50 mg BID on discharge  - Would recommend continuing to monitor BP and may resume at a lowered dose if indicated, and titrate up as able    LE Edema: Patient has a history of LE edema and was previously taking Lasix  20 mg daily. Would recommend that this be given on an as needed basis if he develops lower extremity or upper extremity edema, or gains 3 lbs in one  day, or 5-7 lbs in one week.  - Take Lasix  20 mg daily as needed if he develops lower extremity or upper extremity edema, or gains 3 lbs in one day, or 5-7 lbs in one week  Prior CVA history: Patient was previously taking non-high intensity statin and ASA daily. After shared decision making with granddaughter [name], would recommend discontinuation of these medications on discharge.  - STOP Aspirin  81 mg daily  - STOP Atorvastatin  10 mg daily  2.  Labs / imaging needed at time of follow-up: CBC (Hgb), BMP to recheck potassium   3.  Pending labs/ test needing follow-up: None  Follow-up  Appointments:    Hospital Course by problem list: [name] is a 89 y.o. person living with a history of history of dementia, diverticulitis, CKD3a, HTN, HLD, CVA (2013, 2021), DVT (2015), BPH s/p TURP (2015), thrombocytopenia, who presented with melena and was admitted for medical management of GI bleed and was treated for aspiration pneumonia, now being discharged on hospital day 6 with the following pertinent hospital course:  #Melena #Coffee-ground emesis  #Anemia #Hypotension, resolved  #Hypoxemia, improved to baseline  Prior to admission, patient had 3 black tarry BM at [facility] 10/27, though did not have vomiting episodes and was acting like his normal self, eating and drinking appropriately with assistance. He is on chronic oxygen  4L [place] supplementation at the rehab facility, though unclear why. Collateral from granddaughter [name] was that she last understood patient's supplemental oxygen  to be at 2L [place], and didn't know that he had been increased to 4L  at the facility. Hgb on admission of 9.3, Hct of 30.9. CXR on admission showed no acute process. During initial interview and exam of patient, supplemental O2 was turned off and he remained at SpO2 100% on room air for the next 6 hours. Patient did not appear dyspneic or gasping for air. Difficult to assess ROS given patient is baseline oriented to self only, but on exam he did not have tenderness to palpation of his abdomen. ED MD performed DRE which had brown stool, no evidence of blood. Reassuringly, vitals were stable on admission. Discussed with patient's granddaughter [name] who is his primary decision maker, who did not wish for invasive procedures including EGD or colonoscopy unless emergent needs. Her primary goal is his comfort and stabilization. He was initiated on IV Protonix  and IVF. Home meds were held. Overnight of admission, he vomited and aspirated his vomit with coffee ground emesis, as well as large amounts of melanotic  stool, with issues of hypotension and hypoxemia thereafter. CXR showed increased small scattered perihilar opacities, left greater than right, which may represent atelectasis or pneumonia and repeat CXR continued to show right apical opacity and bibasilar patchy opacities, possibly related to hypoxia. Hgb decreased from 9.3 to 8.4 in roughly 12 hours. Given his instability, he was given 1 unit PRBC and IVF bolus. AM CBC showed Hgb stable at 9.2, with no further episodes of hematemesis. He had to be placed on 7L HFNC. He was initiated on a 5-day course of Zosyn  for aspiration pneumonia coverage. He had x2 more episodes of Type 7 red/black stools over the next 24 hours. CBC were trended q8 hrs which showed stable Hgb mid 7's. His BP remained soft, but improving. By 10/31, he had remained afebrile and hemodynamically stable. He did not have any more bloody or melanotic stools for 4 days prior to discharge. He was monitored with continuous cardiac monitoring, q8 CBC, close attention to  his vitals, PPI, IVF, and antibiotics.  - repeat CBC to ensure stable and improving Hgb  - Continue Protonix  40 mg PO BID for 8 weeks, followed by Protonix  40 mg once daily indefinitely - Avoid all NSAIDs indefinitely  #Aspiration Pneumonia #Hypoxemia, improving  #Leukocytosis, improving #Aspiration Risk Patient with increased O2 requirement initially after aspiration event after choking on his vomit. He was initiated on IV Unasyn  for aspiration pneumonia coverage. Leukocytosis improved from 19.9 to 10.4 by day of discharge. CXR showed increased patchy RLL opacity c/f pneumonia or aspiration. He was treated with 5 days of IV Unasyn , and given rhonchorous sounding lungs, extended for 10 days total of antibiotic treatment. He received his AM and afternoon Augmentin prior to discharge, and will need to begin his outpatient medications at 2200 on 01/15/2024 to finish out his Augmentin prescription. Patient was evaluated by SLP during  admission and he continues to show aspiration risk. With shared decision making with granddaughter [name], recommendation to continue with puree with thin liquid diet. By day of discharge he was oxygenating well on home chronic 2L [place] at 100% with good forehead pleth.  - Continue Augmentin 500-125 mg q8 hrs for 10 doses beginning at 2200 on 11/4 - Continue with puree with thin liquid diet   #HTN Patient has a history of HTN and per rehab facility records has been taking Losartan 50 mg BID. Last dose outpatient was given evening before admission. He had low-normal BP initially so this was held. Monitored BP over the course of his admission and once he was medically stable, his BP remained normotensive. Would recommend continuing to monitor BP and may resume at a lowered dose if indicated.   - HOLD home Losartan 50 mg BID  - Would recommend continuing to monitor BP and may resume at a lowered dose if indicated.     #Prior CVA (2013, 2021) Patient has a history of CVA in 2013 and 2021. Unclear if there were any residual deficits. [name] though RN who works with patient was not available so spoke with diplomatic services operational officer. He takes Aspirin  81 mg daily and Atorvastatin  10 mg daily, he last took aspirin  10/28 AM and Atorvastatin  on 10/27 PM prior to admission. Given concern for GI bleeding, decreased Hgb and history of diverticulitis, held aspirin  on admission. After GOC discussion with patient's decision maker Granddaughter [name], have decided to discontinue these medications altogether.   - STOP Aspirin  81 mg daily  - STOP Atorvastatin  10 mg daily    #CKD3a Patient has a history of CKD3a, with most recent Cr of ~1.5 - 1.6 in May 2024 and eGFR at that time ~47. This admission with Cr of 1.6, though eGFR of 40 which puts him closer to CKD3b if this is his new normal. He did have an AKI on CKD with peak Cr of 2.01. This was trended with BMP and showed improvement to 1.23 by day of discharge.     #Thrombocytopenia Patient has a history of thrombocytopenia, baseline appears to be 120-150s. Platelets on admission of 149, they improved to 173 by day of discharge. He was monitored daily with CBC.   #Dementia Patient with advanced dementia and oriented to self only at baseline. He was put on delirium precautions during admission.     Subjective Patient resting comfortably in bed. On interview he smiled and was alert for the team. O2 on 2L  at 100% oxygenation with good forehead pleth. Spoke with granddaughter [name] via telephone regarding stable discharge  back to [place].   Discharge Exam:   BP 133/76 (BP Location: Right Arm)   Pulse 71   Temp 98.4 F (36.9 C) (Oral)   Resp 19   Ht 5' 8 (1.727 m)   Wt 85 kg   SpO2 93%   BMI 28.49 kg/m   Physical Exam:   Constitutional: alert-appearing male lying in hospital bed, in no acute distress HEENT: normocephalic atraumatic, mucous membranes moist Cardiovascular: regular rate and rhythm, bilateral radial pulses 2+ Pulmonary/Chest: normal work of breathing on 2L O2, lungs with rhonchorous sounds bilaterally and mild wheezes Abdominal: soft, non-distended MSK: normal bulk and tone, SCDs in place on bilateral LE Neurological: alert and oriented at baseline to his name Skin: warm and dry Psych: mood calm, behavior normal   Pertinent Labs, Studies, and Procedures:     Latest Ref Rng & Units 01/15/2024    6:27 AM 01/14/2024    2:40 AM 01/13/2024    1:40 AM  CBC  WBC 4.0 - 10.5 K/uL 10.4  10.8  10.3   Hemoglobin 13.0 - 17.0 g/dL 7.2  7.3  7.3   Hematocrit 39.0 - 52.0 % 22.6  22.9  23.0   Platelets 150 - 400 K/uL 173  136  125        Latest Ref Rng & Units 01/15/2024    6:27 AM 01/14/2024    2:40 AM 01/13/2024    1:40 AM  CMP  Glucose 70 - 99 mg/dL 861  885  862   BUN 8 - 23 mg/dL 19  21  22    Creatinine 0.61 - 1.24 mg/dL 8.76  8.74  8.82   Sodium 135 - 145 mmol/L 143  143  139   Potassium 3.5 - 5.1 mmol/L 3.3  3.5  3.3    Chloride 98 - 111 mmol/L 107  111  110   CO2 22 - 32 mmol/L 23  24  23    Calcium  8.9 - 10.3 mg/dL 9.9  9.4  9.2     DG CHEST PORT 1 VIEW Result Date: 01/09/2024 EXAM: 1 VIEW(S) XRAY OF THE CHEST 01/09/2024 08:58:15 AM COMPARISON: 01/09/2024 CLINICAL HISTORY: Hypoxia 200808. Reason for exam - hypoxia. FINDINGS: LUNGS AND PLEURA: Low lung volumes. Right apical opacity. Bibasilar patchy opacities. No pulmonary edema. No pleural effusion. No pneumothorax. HEART AND MEDIASTINUM: The cardiac silhouette shows no acute abnormality. The mediastinal silhouette demonstrates a tortuous thoracic aorta. BONES AND SOFT TISSUES: No acute osseous abnormality. IMPRESSION: 1. Right apical opacity and bibasilar patchy opacities, possibly related to hypoxia. 2. Low lung volumes. Electronically signed by: Evalene Coho MD 01/09/2024 09:18 AM EDT RP Workstation: [id number]   DG CHEST PORT 1 VIEW Result Date: 01/09/2024 EXAM: 1 VIEW(S) XRAY OF THE CHEST 01/09/2024 12:45:30 AM COMPARISON: Portable chest yesterday at 11:32 AM. CLINICAL HISTORY: [id number] Aspiration into airway [id number]. Aspiration into airway Aspiration into airway FINDINGS: LUNGS AND PLEURA: Increased small scattered perihilar opacities on the left greater than right. In the setting of low lung volumes could represent atelectasis or pneumonia. Remaining lungs are clear. No pulmonary edema. No pleural effusion. No pneumothorax. HEART AND MEDIASTINUM: Mild cardiomegaly. No evidence of CHF. There is a tortuous aorta with atherosclerosis, stable mediastinum. BONES AND SOFT TISSUES: No acute osseous abnormality. IMPRESSION: 1. Increased small scattered perihilar opacities, left greater than right, which may represent atelectasis or pneumonia. low lung volumes. 2. Mild cardiomegaly without evidence of congestive heart failure. 3. Aortic tortuosity with atherosclerosis. Electronically signed by: Francis Quam MD 01/09/2024 12:53  AM EDT RP Workstation: [id number]    DG Chest Port 1 View Result Date: 01/08/2024 EXAM: 1 VIEW(S) XRAY OF THE CHEST 01/08/2024 11:37:00 AM COMPARISON: CTA chest 08/01/2022 chest radiograph 07/29/2022. CLINICAL HISTORY: sob. SOB. Pt BIB [facility] from [facility]. Pt had blood in stool yesterday. Reported it was dark and tarry. Hx of GI bleeds and dementia. FINDINGS: LUNGS AND PLEURA: Low lung volumes. No focal pulmonary opacity. No pulmonary edema. No pleural effusion. No pneumothorax. HEART AND MEDIASTINUM: Aortic atherosclerosis. Tortuosity of the thoracic aorta. No acute abnormality of the cardiac and mediastinal silhouettes. BONES AND SOFT TISSUES: No acute osseous abnormality. IMPRESSION: 1. No acute process. 2. Low lung volumes. Electronically signed by: Donnice Mania MD 01/08/2024 12:25 PM EDT RP Workstation: [id number]     Discharge Instructions: Discharge Instructions     Diet general   Complete by: As directed    Puree and thin liquids diet   Increase activity slowly   Complete by: As directed    No wound care   Complete by: As directed          Discharge Instructions      To Mr. Cutter Passey Stefanski's caretakers,  They were admitted to [facility] on 01/08/2024 for evaluation and treatment of: black stools     The evaluation suggested likely an upper GI bleed given his black stools. They were treated with conservative medical management including PPI, IV fluids, and close monitoring. He was covered for aspiration pneumonia after an aspiration event on his vomit, with IV antibiotics before transitioning to oral antibiotics.   They were discharged from the hospital on 01/15/24. I recommend the following after leaving the hospital:   Medications Adjusted During Hospital Admission:  1) TAKE Pantoprazole  (Protonix ) 40 mg: take one tablet twice daily for 8 weeks, then beginning 03/14/2023 begin taking Pantoprazole  40 mg one tablet daily indefinitely   2) TAKE Augmentin 500-125 mg q8 hrs for 10 doses  beginning at 2200 on 11/4   Medications stopped during admission:   1) DO NOT TAKE Aspirin   2) DO NOT TAKE Atorvastatin     Medications held during admission: Losartan 50 mg: HOLD home Losartan 50 mg twice daily on discharge  - Would recommend continuing to monitor BP and may resume at a lowered dose if indicated, and titrate up as able    Lasix  20 mg: Patient has a history of LE edema and was previously taking Lasix  20 mg daily. Would recommend that this be given on an as needed basis if he develops lower extremity or upper extremity edema, or gains 3 lbs in one day, or 5-7 lbs in one week.  - Take Lasix  20 mg daily as needed if he develops lower extremity or upper extremity edema, or gains 3 lbs in one day, or 5-7 lbs in one week    AVOID ALL NSAIDS INDEFINITELY     For questions about the care plan: Call [phone]. Dial 0 for the operator. Ask for the internal medicine resident on call.  Thank you for allowing us  to be part of your care. I am so glad that you are feeling better!  Doyal Miyamoto, MD 01/15/2024, 1:44 PM        Signed: Doyal Miyamoto, MD 01/15/2024, 1:53 PM

## 2024-01-16 DIAGNOSIS — K922 Gastrointestinal hemorrhage, unspecified: Secondary | ICD-10-CM | POA: Diagnosis not present

## 2024-01-16 DIAGNOSIS — I5032 Chronic diastolic (congestive) heart failure: Secondary | ICD-10-CM

## 2024-01-16 DIAGNOSIS — N183 Chronic kidney disease, stage 3 unspecified: Secondary | ICD-10-CM | POA: Diagnosis not present

## 2024-01-16 LAB — BASIC METABOLIC PANEL WITH GFR
Anion gap: 9 (ref 5–15)
BUN: 20 mg/dL (ref 8–23)
CO2: 23 mmol/L (ref 22–32)
Calcium: 9.6 mg/dL (ref 8.9–10.3)
Chloride: 110 mmol/L (ref 98–111)
Creatinine, Ser: 1.37 mg/dL — ABNORMAL HIGH (ref 0.61–1.24)
GFR, Estimated: 47 mL/min — ABNORMAL LOW (ref >60)
Glucose, Bld: 188 mg/dL — ABNORMAL HIGH (ref 70–99)
Potassium: 3.7 mmol/L (ref 3.5–5.1)
Sodium: 142 mmol/L (ref 135–145)

## 2024-01-16 LAB — CBC
HCT: 22 % — ABNORMAL LOW (ref 39.0–52.0)
Hemoglobin: 6.9 g/dL — CL (ref 13.0–17.0)
MCH: 30.3 pg (ref 26.0–34.0)
MCHC: 31.4 g/dL (ref 30.0–36.0)
MCV: 96.5 fL (ref 80.0–100.0)
Platelets: 185 K/uL (ref 150–400)
RBC: 2.28 MIL/uL — ABNORMAL LOW (ref 4.22–5.81)
RDW: 14.7 % (ref 11.5–15.5)
WBC: 8.9 K/uL (ref 4.0–10.5)
nRBC: 0.2 % (ref 0.0–0.2)

## 2024-01-16 LAB — HEMOGLOBIN AND HEMATOCRIT, BLOOD
HCT: 24.6 % — ABNORMAL LOW (ref 39.0–52.0)
Hemoglobin: 7.7 g/dL — ABNORMAL LOW (ref 13.0–17.0)

## 2024-01-16 LAB — PREPARE RBC (CROSSMATCH)

## 2024-01-16 MED ORDER — SODIUM CHLORIDE 0.9% IV SOLUTION: Freq: Once | INTRAVENOUS | Status: DC

## 2024-01-16 MED ORDER — AMOXICILLIN-POT CLAVULANATE 250-62.5 MG/5ML PO SUSR: 500.0000 mg | Freq: Three times a day (TID) | ORAL | Status: AC

## 2024-01-16 NOTE — Progress Notes (Signed)
 Date and time results received: 01/16/24 02:33  Test: Hemoglobin  Critical Value: 6.9    Name of Provider Notified: Azadegan

## 2024-01-16 NOTE — Progress Notes (Signed)
 Date and time results received: 01/16/24 0235 (use smartphrase .now to insert current time)  Test: Hemoglobin Critical Value: 6.9  Name of Provider Notified: Bernadine, MD  Orders Received? Or Actions Taken?: Orders Received - See Orders for details

## 2024-01-16 NOTE — Progress Notes (Shared)
 HD#7 SUBJECTIVE:  Patient Summary: [name] is a 89 y.o. person living with a history of history of dementia, diverticulitis, CKD3a, HTN, HLD, CVA (2013, 2021), DVT (2015), BPH s/p TURP (2015), thrombocytopenia, who presented with melena and is admitted for medical management of GI bleed, now being treated for aspiration pneumonia.   Overnight Events:  Hgb of 6.9, received 1 unit PRBC. Asymptomatic, normotensive, no further bloody or black BM   Interim History:  Patient resting comfortably in bed. On interview he smiled and was alert for the team. O2 on 2L [place] at 100% oxygenation with good forehead pleth. No further black or bloody BM.    OBJECTIVE:  Vital Signs: Vitals:   01/15/24 1908 01/15/24 2324 01/16/24 0245 01/16/24 0423  BP: (!) 141/78 127/89  123/62  Pulse: (!) 110 (!) 105  97  Resp:      Temp: 98 F (36.7 C) 97.6 F (36.4 C)  (!) 97.3 F (36.3 C)  TempSrc: Oral Oral  Oral  SpO2: 99% 97% 96% 99%  Weight:      Height:       Supplemental O2: Nasal Cannula SpO2: 99 % O2 Flow Rate (L/min): 2 L/min FiO2 (%): 28 %  Filed Weights   01/08/24 1223  Weight: 85 kg     Intake/Output Summary (Last 24 hours) at 01/16/2024 0635 Last data filed at 01/15/2024 1700 Gross per 24 hour  Intake 358 ml  Output 500 ml  Net -142 ml   Net IO Since Admission: 2,844.21 mL [01/16/24 0635]  Physical Exam:  Constitutional: alert-appearing male lying in hospital bed, in no acute distress HEENT: normocephalic atraumatic, mucous membranes moist Cardiovascular: regular rate and rhythm, bilateral radial pulses 2+ Pulmonary/Chest: normal work of breathing on 2L O2, lungs with rhonchorous sounds bilaterally and moderate wheezing  Abdominal: soft, non-distended MSK: normal bulk and tone, SCDs in place on bilateral LE Neurological: alert and oriented at baseline to his name Skin: warm and dry Psych: mood calm, behavior normal   Patient Lines/Drains/Airways Status     Active  Line/Drains/Airways     Name Placement date Placement time Site Days   Peripheral IV 01/08/24 20 G Right Antecubital 01/08/24  1145  Antecubital  1   Peripheral IV 01/09/24 22 G 1.75 Left;Upper Arm 01/09/24  0110  Arm  less than 1   Peripheral IV 01/09/24 20 G 2.5 Anterior;Left Forearm 01/09/24  0130  Forearm  less than 1   Wound 01/09/24 0500 Other (Comment) Coccyx Mid;Upper Stage 2 -  Partial thickness loss of dermis presenting as a shallow open injury with a red, pink wound bed without slough. 01/09/24  0500  Coccyx  less than 1            Pertinent labs and imaging:      Latest Ref Rng & Units 01/16/2024    1:47 AM 01/15/2024    6:27 AM 01/14/2024    2:40 AM  CBC  WBC 4.0 - 10.5 K/uL 8.9  10.4  10.8   Hemoglobin 13.0 - 17.0 g/dL 6.9  7.2  7.3   Hematocrit 39.0 - 52.0 % 22.0  22.6  22.9   Platelets 150 - 400 K/uL 185  173  136        Latest Ref Rng & Units 01/16/2024    1:47 AM 01/15/2024    6:27 AM 01/14/2024    2:40 AM  CMP  Glucose 70 - 99 mg/dL 811  861  885   BUN  8 - 23 mg/dL 20  19  21    Creatinine 0.61 - 1.24 mg/dL 8.62  8.76  8.74   Sodium 135 - 145 mmol/L 142  143  143   Potassium 3.5 - 5.1 mmol/L 3.7  3.3  3.5   Chloride 98 - 111 mmol/L 110  107  111   CO2 22 - 32 mmol/L 23  23  24    Calcium  8.9 - 10.3 mg/dL 9.6  9.9  9.4     DG Swallowing Func-Speech Pathology Result Date: 01/15/2024 Table formatting from the original result was not included. Modified Barium Swallow Study Patient Details Name: [name] MRN: [id number] Date of Birth: [birth year] Today's Date: 01/15/2024 HPI/PMH: HPI: 89 year old male with advanced dementia admitted from care facility with several days of dark stools, with hemoglobin of 9 down from previous baseline of 14 last May.  He was hemodynamically stable on arrival to the ED, but experienced an aspiration event late last night with coffee-ground emesis, and has had problems with hypotension and hypoxemia since then. CXR following  aspiration event showing increased bilateral opacities. Patient seen by SLP at bedside 07/31/22 following and unreponsive episode after vomitting as well, found to have a normal oropharyngeal swallow. Seen also in 2020 at bedside and placed on a dysphagia 1, thin liquid diet at that time. No completion of instrumental study found in chart. Clinical Impression: Clinical Impression: MBS complete. Overall, patient presents with a mild oropharyngeal dysphagia. Orally, he does present with mildly decreased bolus cohesion with resultant prolonged mastication of regular texture solids and mild oral residuals post swallow but overall function clearance of all boluses provided. His swallow is initiated largely at the pyriform sinuses with liquids consistencies and at the vallecula with solids. Despite a posterior curvature of his cervical spine with suspected osteophytes beginning at C3, base of tongue makes good approximation with posterior pharyngeal wall and epiglottis does deflect, with possible slight obstruction and intermittently decreased laryngeal closure. Given presence of mild deficits noted above and current baseline cognitive deficits, he did inconsistently penetrate both thin and honey thick liquids during today's study. Thin liquids penetrated during the swallow and honey thick liquid oral residue that pools to the vallecula and eventually the pyriform sinuses post swallow penetrated after the swallow. Unfortunately patient unable to consistently follow directions for a cued cough or throat clear in an effort to clear. He did present with delayed throat clearing and coughing which appeared effective to clear although positioning and posture did not allow for a full and clear view far under the vocal cords to confirm/rule out any other deeper aspiration episodes. Discussed with patient's grandaughter via phone. Based on results of MBS, do not feel that there will be a significant difference in decreasing  aspiration risk with thin vs thickened liquids at this time and aspiration of thickened liquids at an increased chance of causing an infection as well as increasing the risk of dehydration. Grandaughter aware of risk of aspiration and would like to continue a puree diet with thin liquids with possible opportunity to advance solids after return to SNF which based on this test is judged appropriate. No further SLP needs indicated as patient with plans to discharge today. Factors that may increase risk of adverse event in presence of aspiration Noe & Lianne 2021): Factors that may increase risk of adverse event in presence of aspiration Noe & Lianne 2021): Poor general health and/or compromised immunity; Reduced cognitive function; Limited mobility Recommendations/Plan: Swallowing Evaluation Recommendations Swallowing Evaluation  Recommendations Recommendations: PO diet PO Diet Recommendation: Dysphagia 1 (Pureed); Thin liquids (Level 0) Liquid Administration via: Cup; Straw Medication Administration: Crushed with puree Supervision: Staff to assist with self-feeding; Full supervision/cueing for swallowing strategies Swallowing strategies  : Slow rate; Small bites/sips (encourage intermittent throat clearing and/or hard cough if able) Postural changes: Position pt fully upright for meals Oral care recommendations: Oral care BID (2x/day) Treatment Plan Treatment Plan Treatment recommendations: Defer treatment plan to SLP at other venue (see follow-up recommendations) Follow-up recommendations: Skilled nursing-short term rehab (<3 hours/day) Recommendations Recommendations for follow up therapy are one component of a multi-disciplinary discharge planning process, led by the attending physician.  Recommendations may be updated based on patient status, additional functional criteria and insurance authorization. Assessment: Orofacial Exam: Orofacial Exam Oral Cavity: Oral Hygiene: WFL Oral Cavity - Dentition:  Adequate natural dentition; Dentures, top Orofacial Anatomy: WFL Oral Motor/Sensory Function: WFL Anatomy: Anatomy: Suspected cervical osteophytes (posterior curvature of C-spine with osteophytes beginning at C3 (difficult to see below due to shoulder position/posture)) Boluses Administered: Boluses Administered Boluses Administered: Thin liquids (Level 0); Mildly thick liquids (Level 2, nectar thick); Moderately thick liquids (Level 3, honey thick); Solid; Puree  Oral Impairment Domain: Oral Impairment Domain Lip Closure: No labial escape Tongue control during bolus hold: Escape to lateral buccal cavity/floor of mouth; Posterior escape of greater than half of bolus Bolus preparation/mastication: Slow prolonged chewing/mashing with complete recollection Bolus transport/lingual motion: Delayed initiation of tongue motion (oral holding) Oral residue: Residue collection on oral structures Location of oral residue : Floor of mouth; Tongue; Lateral sulci Initiation of pharyngeal swallow : Pyriform sinuses  Pharyngeal Impairment Domain: Pharyngeal Impairment Domain Soft palate elevation: No bolus between soft palate (SP)/pharyngeal wall (PW) Laryngeal elevation: Complete superior movement of thyroid cartilage with complete approximation of arytenoids to epiglottic petiole Anterior hyoid excursion: Complete anterior movement Epiglottic movement: Partial inversion (possible partial obstruction by osteophytes) Laryngeal vestibule closure: Incomplete, narrow column air/contrast in laryngeal vestibule Pharyngeal stripping wave : -- (difficult to view) Pharyngeal contraction (A/P view only): N/A Pharyngoesophageal segment opening: -- (unable to view, suspect intact) Tongue base retraction: No contrast between tongue base and posterior pharyngeal wall (PPW) Pharyngeal residue: Trace residue within or on pharyngeal structures Location of pharyngeal residue: Valleculae  Esophageal Impairment Domain: Esophageal Impairment Domain  Esophageal clearance upright position: Complete clearance, esophageal coating Pill: Pill Consistency administered: Puree Puree: Impaired (see clinical impressions) Penetration/Aspiration Scale Score: Penetration/Aspiration Scale Score 1.  Material does not enter airway: Puree; Solid; Pill 2.  Material enters airway, remains ABOVE vocal cords then ejected out: Thin liquids (Level 0) 3.  Material enters airway, remains ABOVE vocal cords and not ejected out: Thin liquids (Level 0); Moderately thick liquids (Level 3, honey thick) 5.  Material enters airway, CONTACTS cords and not ejected out: -- (residue) Compensatory Strategies: Compensatory Strategies Compensatory strategies: No   General Information: Caregiver present: No  Diet Prior to this Study: Thin liquids (Level 0); Dysphagia 1 (pureed)   Temperature : Normal   Respiratory Status: WFL   Supplemental O2: None (Room air)   History of Recent Intubation: No  Behavior/Cognition: Alert; Cooperative; Requires cueing Self-Feeding Abilities: Able to self-feed Baseline vocal quality/speech: Normal Volitional Cough: Unable to elicit Volitional Swallow: Unable to elicit Exam Limitations: Limited visibility Goal Planning: No data recorded No data recorded No data recorded No data recorded No data recorded Pain: Pain Assessment Pain Assessment: Faces Faces Pain Scale: 0 Breathing: 0 Negative Vocalization: 0 Facial Expression: 0 Body Language: 0 Consolability:  0 PAINAD Score: 0 Pain Intervention(s): Monitored during session End of Session: Start Time:SLP Start Time (ACUTE ONLY): 0945 Stop Time: SLP Stop Time (ACUTE ONLY): 1000 Time Calculation:SLP Time Calculation (min) (ACUTE ONLY): 15 min Charges: SLP Evaluations $ SLP Speech Visit: 1 Visit SLP Evaluations $MBS Swallow: 1 Procedure $Swallowing Treatment: 1 Procedure SLP visit diagnosis: SLP Visit Diagnosis: Dysphagia, oropharyngeal phase (R13.12) Past Medical History: Past Medical History: Diagnosis Date  Anemia   BPH  (benign prostatic hypertrophy)   TURP 05/19/13  Cerebral embolism with cerebral infarction (HCC) 12/19/2013  Cerebrovascular accident (CVA) (HCC) 04/17/2019  Chronic kidney disease   CHRONIC KIDNEY DISEASE, 2  COVID-19 03/19/2020  1/7- 03/26/2020 presentation of generalized weakness.  Treated with Decadron . CRP peaked at 2.3 and was 0.6 at discharge.  No D-dimer performed.  Difficulty hearing, right   BILATERAL HEARING LOSS - BEST TO TRY TO SPEAK INTO LEFT EAR  DVT (deep venous thrombosis) (HCC)   Frequent falls   History of DVT (deep vein thrombosis) 06/10/2013  Provoked s/p TURP. Dx per doppler 06/10/13. Complicated by hematuria while on anticoagulation s/p TURP. Initially on lovenox  and coumadin , then stopped, then IVC filter placed 06/16/13. Anticoagulation restarted after hematuria stopped. Lovenox  was discontinued apparently on 07/29/13 with comment on high risk for falls.   Total ~1.5 months of anticoagulation but interrupted with bleeding complication.  Hyperlipidemia   Hypertension   Incontinence of urine   SOME INCONTINENCE  Left adrenal mass 09/11/2013  Meningioma (HCC)   Stroke (HCC)   Cerebellar, 2013; WALKS WITH WALKER, ABLE TO DRESS AND BATHE HIMSELF BUT FAMILY TRIES TO PROVIDE SUPERVISION BECAUSE OF HIS HX OF FALL AND WEAKNESS LEGS, ARMS   Thrombocytopenia   Vitreous hemorrhage (HCC) 12/21/2013  OS 12/18/13 CT /MRI brain done for ataxia  Past Surgical History: Past Surgical History: Procedure Laterality Date  CYSTOSCOPY N/A 06/13/2013  Procedure: CYSTOSCOPY FLEXIBLE BEDSIDE;  Surgeon: Morene LELON Salines, MD;  [facility];  Service: Urology;  Laterality: N/A;  MYRINGOTOMY WITH TUBE PLACEMENT Bilateral   TRANSURETHRAL RESECTION OF PROSTATE N/A 05/19/2013  Procedure: TRANSURETHRAL RESECTION OF THE PROSTATE WITH GYRUS INSTRUMENTS;  Surgeon: Arlena LILLETTE Gal, MD;  Location: [facility] ORS;  Service: Urology;  Laterality: N/A; Rea Pass MA, CCC-SLP McCoy Leah Meryl 01/15/2024, 11:08 AM    ASSESSMENT/PLAN:   Assessment: Principal Problem:   Upper GI bleed Active Problems:   HTN (hypertension)   History of CVA (cerebrovascular accident)   CKD (chronic kidney disease) stage 3, GFR 30-59 ml/min (HCC)   Hyperlipidemia   Chronic diastolic (congestive) heart failure (HCC)   Rectal bleeding   GI bleed   Aspiration pneumonia of both lungs due to vomit (HCC)   Plan: #Aspiration Pneumonia #Hypoxemia, at baseline chronic O2 requirement   #Leukocytosis, resolved Patient with increased O2 requirement initially after aspiration event after choking on his vomit. He was initiated on IV Unasyn  for aspiration pneumonia coverage. Leukocytosis improved from 19.9 to 10.4 today. CXR showed increased patchy RLL opacity c/f pneumonia or aspiration. He was treated with 5 days of IV Unasyn , and given rhonchorous sounding lungs, will extend for 5 additional days with Augmentin for a total of 10 days. He is currently on 2L [place], and was previously on 4L [place] chronically at [facility]  - s/p IV Unasyn  per pharm dosing for aspiration pneumonia coverage (i10/29 - d11/2), now transitioned to Augmentin 500-125 mg q8 hrs for 5 more days (i11/3) for a total of 10 days - f/u CBC - Duoneb q6 hrs  prn  #Melena #Coffee-ground emesis #Anemia #Hypotension, resolved  Patient presented with melanotic stools that began a day before his admission from his rehab facility. This admission thus far was given 1 unit PRBC and IVF bolus due to hypotension following aspiration event after patient vomited. Trended CBC q8 hrs then daily 9.2 --> 8.6 --> 7.6 --> 7.7 --> 8.0 --> 7.8 --> 7.7 --> 7.3 --> 7.2. He has been managed conservatively with IVF and IV PPI, transfusion as needed. S/p 5 days of IV Unasyn  for aspiration pneumonia coverage and transitioned to oral Augmentin. Today improvement of blood pressure and stable  Hgb 7.2, and in the last 96 hours he has not had any more bloody stools, most recent stool green/clay Type 7. Leukocytosis 10.4  down from 19.9. GI was consulted, appreciate assistance, they have no signed off and we will re-engage if needed. He was resumed on full liquid diet on 10/31 after confirmation from GI for no planned scope. He is on 2L [place] currently. SLP planning for MBS today to assess outpatient feeding recommendations on discharge.  GI Recommendations:  - Continue supportive care: IV Protonix  40 mg BID while in hospital, then transition to PO BID PPI for 8 weeks, followed by once daily PPI indefinitely - Avoid all NSAIDs indefinitely - only recommend EGD to perform hemostasis in setting of significant, ongoing bleeding  - close monitoring of BM and signs of overt bleeding - IV Protonix  40 mg BID while in hospital, then transition to PO BID PPI for 8 weeks, followed by once daily PPI indefinitely - Avoid all NSAIDs indefinitely - IV Zofran  4 mg q6 hrs prn  - f/u CBC  - if Hgb < 7 or hypotensive, transfuse PRBCs - s/p IV Unasyn  per pharm dosing for aspiration pneumonia coverage (i10/29 - d11/2), now transitioned to Augmentin 500-125 mg q8 hrs for 5 more days (i11/3) for a total of 10 days - Full liquid diet  - SLP following, appreciate assistance   - MBS today with recommendation of puree and thin liquids diet    #Dementia Patient with dementia and oriented to self only at baseline. Today he is oriented to name at baseline.  - Delirium precautions    #HTN Patient has a history of HTN and per rehab facility records has been taking Losartan 50 mg BID. Last dose was given 10/27 evening before admission. Today improving BP, though will continue to hold and monitor hemodynamics. Will determine if he needs BP medication upon discharge.  - Hold home Losartan 50 mg BID    #Prior CVA (2013, 2021) Patient has a history of CVA in 2013 and 2021. Unclear if there were any residual deficits. He takes Aspirin  81 mg daily and Atorvastatin  10 mg daily, he last took aspirin  10/28 AM and Atorvastatin  on 10/27 PM. Given concern  for GI bleeding, decreased Hgb and history of diverticulitis, have held aspirin  during admission and plan to discontinue this upon discharge. Atorvastatin  10 mg is not high intensity, and plan to discontinue this upon discharge.  - Stop Aspirin  81 mg daily, plan to discontinue this upon discharge  - Stop Atorvastatin  10 mg daily, plan to discontinue this upon discharge    #CKD3a vs. CKD3b #AKI on CKD, resolved  Patient has a history of CKD3a, with most recent Cr of ~1.5 - 1.6 in 2024 and eGFR at that time ~47.  He did have an AKI on CKD with peak Cr of 2.01. This was trended with BMP and showed improvement to  1.25 today, eGFR 52.   - f/u BMP    #Thrombocytopenia Patient has a history of thrombocytopenia, baseline appears to be 120-150s. Platelets on admission of 149. Today platelets 136. Will continue to monitor.  - CBC  Best Practice: Diet: Full liquid diet  VTE: SCDs Start: 01/08/24 1527 Code: DNR - Limited  Disposition planning: Family Contact: Granddaughter [name], called and notified. DISPO: Anticipated discharge tomorrow to [facility] pending [facility] staffing.  Signature:  Doyal Miyamoto, MD [facility]  6:35 AM, 01/16/2024  On Call pager [phone]

## 2024-01-16 NOTE — Progress Notes (Signed)
 Phlebotomy called about type and screen order. They were informed that pt needs blood transfusion, and we are waiting them to collect blood. They will be here soon, according to phlebotomist.

## 2024-01-16 NOTE — Progress Notes (Signed)
 Report called to [facility] and given the nurse Alan.

## 2024-01-16 NOTE — Progress Notes (Signed)
 Was having some expiratory wheezes and rales at his lung bases.  Turned and repositioned, taught deep breathing and coughing, and instructed on IS use and had him to do return demonstration.  Also receiving breathing treatments from RT.

## 2024-01-16 NOTE — Progress Notes (Signed)
 He was incontinent. Was cleaned, linen changed and new gown and applied prior to transport.

## 2024-01-16 NOTE — TOC Transition Note (Signed)
 Transition of Care [facility]) - Discharge Note   Patient Details  Name: [name] MRN: [id number] Date of Birth: 10-Aug-1924  [facility]) CM/SW Contact:  Almarie CHRISTELLA Goodie, LCSW Phone Number: 01/16/2024, 2:48 PM   Clinical Narrative:   CSW confirmed with MD and [facility] that patient can be discharged today, and assisted with coordinating with granddaughter, [name], about communication. [facility] can accept back and patient is medically stable. Granddaughter, [name], in agreement. Transport arranged with [facility] for next available.  Nurse to call report to [phone], Room 802B.    Final next level of care: Skilled Nursing Facility Barriers to Discharge: Barriers Resolved   Patient Goals and CMS Choice Patient states their goals for this hospitalization and ongoing recovery are:: patient unable to participate in goal setting, not fully oriented CMS Medicare.gov Compare Post Acute Care list provided to:: Patient Represenative (must comment) Choice offered to / list presented to : [facility] POA / Guardian [place] ownership interest in [facility].provided to:: [facility] POA / Guardian    Discharge Placement              Patient chooses bed at: [facility] Patient to be transferred to facility by: [facility] Name of family member notified: [name] Patient and family notified of of transfer: 01/16/24  Discharge Plan and Services Additional resources added to the After Visit Summary for       Post Acute Care Choice: Skilled Nursing Facility                               Social Drivers of Health (SDOH) Interventions SDOH Screenings   Food Insecurity: Food Insecurity Present (05/30/2022)  Housing: Low Risk  (05/30/2022)  Transportation Needs: No Transportation Needs (05/30/2022)  Utilities: Not At Risk (05/30/2022)  Alcohol Screen: Low Risk  (05/30/2022)  Depression (PHQ2-9): Low Risk  (05/30/2022)  Financial Resource Strain: Low Risk  (05/30/2022)  Physical Activity: Inactive  (05/30/2022)  Social Connections: Socially Isolated (05/30/2022)  Stress: No Stress Concern Present (05/30/2022)  Tobacco Use: Low Risk  (07/29/2022)     Readmission Risk Interventions     No data to display

## 2024-01-16 NOTE — [place] FL2 (Signed)
 [place]  MEDICAID FL2 LEVEL OF CARE FORM     IDENTIFICATION  Patient Name: [name] Birthdate: Jan 14, 1925 Sex: male Admission Date (Current Location): 01/08/2024  [facility] and [place] Number:  Producer, Television/film/video and Address:  [facility], [address] [place], [place] [address]      Provider Number: [id number]  Attending Physician Name and Address:  Francesco Elsie NOVAK, MD  Relative Name and Phone Number:       Current Level of Care: Hospital Recommended Level of Care: Skilled Nursing Facility Prior Approval Number:    Date Approved/Denied:   PASRR Number:    Discharge Plan: SNF    Current Diagnoses: Patient Active Problem List   Diagnosis Date Noted   GI bleed 01/09/2024   Aspiration pneumonia of both lungs due to vomit (HCC) 01/09/2024   Upper GI bleed 01/08/2024   Syncope 07/29/2022   Episode of unresponsiveness 07/29/2022   AKI (acute kidney injury) 07/29/2022   Pressure injury of skin 07/29/2022   Lung field abnormal finding on examination 05/31/2022   Irritable 05/12/2021   COVID-19 04/26/2021   Aortic aneurysm 04/26/2021   Bilateral adrenal adenomas 04/26/2021   Cholelithiasis 04/26/2021   Nocturia 01/14/2021   Diverticulosis of colon 07/19/2020   Thrombocytopenia 03/30/2020   Partial symptomatic epilepsy with complex partial seizures, not intractable, without status epilepticus (HCC) 04/17/2019   Rectal bleeding 08/21/2018   Chronic diastolic (congestive) heart failure (HCC) 10/28/2017   Cerebellar ataxia in diseases classified elsewhere (HCC) 02/13/2017   Vascular dementia (HCC) 01/28/2016   Presence of IVC filter 07/17/2014   CKD (chronic kidney disease) stage 3, GFR 30-59 ml/min (HCC) 06/23/2013   Hyperlipidemia    BPH (benign prostatic hyperplasia) 04/24/2013   Cerumen impaction 08/30/2012   Hearing loss 07/20/2011   Meningioma (HCC) 07/20/2011   HTN (hypertension) 07/19/2011   History of CVA  (cerebrovascular accident) 07/19/2011    Orientation RESPIRATION BLADDER Height & Weight     Self  Normal Incontinent Weight: 187 lb 6.3 oz (85 kg) Height:  5' 8 (172.7 cm)  BEHAVIORAL SYMPTOMS/MOOD NEUROLOGICAL BOWEL NUTRITION STATUS      Incontinent Diet (see DC summary)  AMBULATORY STATUS COMMUNICATION OF NEEDS Skin   Extensive Assist Verbally PU Stage and Appropriate Care, Other (Comment) (pressure injury, left heel: foam dressing, lift every shift to assess and change PRN)   PU Stage 2 Dressing:  (mid/upper coccyx, foam dressing: lift every shift to assess and change PRN)                   Personal Care Assistance Level of Assistance  Bathing, Feeding, Dressing Bathing Assistance: Maximum assistance Feeding assistance: Maximum assistance Dressing Assistance: Maximum assistance     Functional Limitations Info  Hearing, Speech   Hearing Info: Impaired Speech Info: Impaired    SPECIAL CARE FACTORS FREQUENCY                       Contractures Contractures Info: Not present    Additional Factors Info  Code Status, Allergies Code Status Info: DNR Allergies Info: NKA           Current Medications (01/16/2024):  This is the current hospital active medication list Current Facility-Administered Medications  Medication Dose Route Frequency Provider Last Rate Last Admin   0.9 %  sodium chloride  infusion (Manually program via Guardrails IV Fluids)   Intravenous Once Azadegan, Maryam, MD       albuterol  (PROVENTIL ) (  2.5 MG/3ML) 0.083% nebulizer solution 2.5 mg  2.5 mg Nebulization Q4H PRN Francesco Elsie NOVAK, MD       amoxicillin-clavulanate (AUGMENTIN) 250-62.5 MG/5ML suspension 500 mg  500 mg Oral Q8H Nguyen, Diana, MD   500 mg at 01/16/24 9470   feeding supplement (BOOST / RESOURCE BREEZE) liquid 1 Container  1 Container Oral TID BM Karna Fellows, MD   1 Container at 01/15/24 2205   ipratropium-albuterol  (DUONEB) 0.5-2.5 (3) MG/3ML nebulizer solution 3 mL  3 mL  Nebulization Q6H Winfrey, William B, MD   3 mL at 01/16/24 0807   ondansetron  (ZOFRAN ) injection 4 mg  4 mg Intravenous Q6H PRN Patel, Amar, DO   4 mg at 01/08/24 2258   pantoprazole  (PROTONIX ) injection 40 mg  40 mg Intravenous Q12H Nguyen, Diana, MD   40 mg at 01/15/24 2205     Discharge Medications: Please see discharge summary for a list of discharge medications.  Relevant Imaging Results:  Relevant Lab Results:   Additional Information SS#: [id number]  Jimmy CHRISTELLA Goodie, LCSW

## 2024-01-16 NOTE — Progress Notes (Signed)
 Discharged to SNF.  Transported by [facility].

## 2024-01-17 LAB — BPAM RBC
Blood Product Expiration Date: 202511182359
ISSUE DATE / TIME: 202511050651
Unit Type and Rh: 6200

## 2024-01-17 LAB — TYPE AND SCREEN
ABO/RH(D): A POS
Antibody Screen: NEGATIVE
Unit division: 0
